# Patient Record
Sex: Female | Born: 1935
Health system: Southern US, Community
[De-identification: ages and names within clinical notes are randomized; demographics above are authoritative.]

## PROBLEM LIST (undated history)

## (undated) DIAGNOSIS — N209 Urinary calculus, unspecified: Secondary | ICD-10-CM

## (undated) DIAGNOSIS — B888 Other specified infestations: Secondary | ICD-10-CM

## (undated) DIAGNOSIS — K5790 Diverticulosis of intestine, part unspecified, without perforation or abscess without bleeding: Secondary | ICD-10-CM

## (undated) DIAGNOSIS — E785 Hyperlipidemia, unspecified: Secondary | ICD-10-CM

## (undated) DIAGNOSIS — A419 Sepsis, unspecified organism: Secondary | ICD-10-CM

## (undated) DIAGNOSIS — F32A Depression, unspecified: Secondary | ICD-10-CM

## (undated) DIAGNOSIS — N10 Acute tubulo-interstitial nephritis: Secondary | ICD-10-CM

## (undated) DIAGNOSIS — R310 Gross hematuria: Secondary | ICD-10-CM

## (undated) DIAGNOSIS — Z9989 Dependence on other enabling machines and devices: Secondary | ICD-10-CM

## (undated) DIAGNOSIS — R300 Dysuria: Secondary | ICD-10-CM

## (undated) DIAGNOSIS — M069 Rheumatoid arthritis, unspecified: Secondary | ICD-10-CM

## (undated) DIAGNOSIS — K5900 Constipation, unspecified: Secondary | ICD-10-CM

## (undated) DIAGNOSIS — B9620 Unspecified Escherichia coli [E. coli] as the cause of diseases classified elsewhere: Secondary | ICD-10-CM

## (undated) DIAGNOSIS — J9621 Acute and chronic respiratory failure with hypoxia: Secondary | ICD-10-CM

## (undated) DIAGNOSIS — M1990 Unspecified osteoarthritis, unspecified site: Secondary | ICD-10-CM

## (undated) DIAGNOSIS — R7881 Bacteremia: Secondary | ICD-10-CM

## (undated) DIAGNOSIS — R0902 Hypoxemia: Secondary | ICD-10-CM

## (undated) DIAGNOSIS — G4733 Obstructive sleep apnea (adult) (pediatric): Secondary | ICD-10-CM

## (undated) DIAGNOSIS — S32059A Unspecified fracture of fifth lumbar vertebra, initial encounter for closed fracture: Secondary | ICD-10-CM

## (undated) DIAGNOSIS — I4891 Unspecified atrial fibrillation: Secondary | ICD-10-CM

## (undated) DIAGNOSIS — R32 Unspecified urinary incontinence: Secondary | ICD-10-CM

## (undated) DIAGNOSIS — F0390 Unspecified dementia without behavioral disturbance: Secondary | ICD-10-CM

## (undated) DIAGNOSIS — Z9889 Other specified postprocedural states: Secondary | ICD-10-CM

## (undated) DIAGNOSIS — W19XXXA Unspecified fall, initial encounter: Secondary | ICD-10-CM

## (undated) DIAGNOSIS — S22089A Unspecified fracture of T11-T12 vertebra, initial encounter for closed fracture: Secondary | ICD-10-CM

## (undated) DIAGNOSIS — N132 Hydronephrosis with renal and ureteral calculous obstruction: Secondary | ICD-10-CM

## (undated) DIAGNOSIS — R609 Edema, unspecified: Secondary | ICD-10-CM

## (undated) DIAGNOSIS — R410 Disorientation, unspecified: Secondary | ICD-10-CM

## (undated) DIAGNOSIS — M4800 Spinal stenosis, site unspecified: Secondary | ICD-10-CM

## (undated) DIAGNOSIS — F349 Persistent mood [affective] disorder, unspecified: Secondary | ICD-10-CM

## (undated) DIAGNOSIS — G894 Chronic pain syndrome: Secondary | ICD-10-CM

## (undated) DIAGNOSIS — M8580 Other specified disorders of bone density and structure, unspecified site: Secondary | ICD-10-CM

## (undated) DIAGNOSIS — G9009 Other idiopathic peripheral autonomic neuropathy: Secondary | ICD-10-CM

## (undated) DIAGNOSIS — N390 Urinary tract infection, site not specified: Secondary | ICD-10-CM

## (undated) DIAGNOSIS — N200 Calculus of kidney: Secondary | ICD-10-CM

## (undated) DIAGNOSIS — F329 Major depressive disorder, single episode, unspecified: Secondary | ICD-10-CM

## (undated) DIAGNOSIS — I714 Abdominal aortic aneurysm, without rupture: Secondary | ICD-10-CM

## (undated) DIAGNOSIS — G9340 Encephalopathy, unspecified: Secondary | ICD-10-CM

## (undated) DIAGNOSIS — I499 Cardiac arrhythmia, unspecified: Secondary | ICD-10-CM

## (undated) DIAGNOSIS — R7303 Prediabetes: Secondary | ICD-10-CM

## (undated) DIAGNOSIS — I10 Essential (primary) hypertension: Secondary | ICD-10-CM

## (undated) HISTORY — DX: Other specified postprocedural states: Z98.890

## (undated) HISTORY — PX: APPENDECTOMY: SHX54

## (undated) HISTORY — DX: Abdominal aortic aneurysm, without rupture: I71.4

## (undated) HISTORY — DX: Diverticulosis of intestine, part unspecified, without perforation or abscess without bleeding: K57.90

## (undated) HISTORY — DX: Other specified disorders of bone density and structure, unspecified site: M85.80

## (undated) HISTORY — PX: CHOLECYSTECTOMY: SHX55

## (undated) HISTORY — DX: Rheumatoid arthritis, unspecified: M06.9

## (undated) HISTORY — DX: Unspecified dementia, unspecified severity, without behavioral disturbance, psychotic disturbance, mood disturbance, and anxiety: F03.90

## (undated) HISTORY — DX: Dependence on other enabling machines and devices: Z99.89

## (undated) HISTORY — PX: JOINT REPLACEMENT: SHX530

## (undated) HISTORY — DX: Spinal stenosis, site unspecified: M48.00

## (undated) HISTORY — DX: Obstructive sleep apnea (adult) (pediatric): G47.33

## (undated) HISTORY — DX: Essential (primary) hypertension: I10

## (undated) HISTORY — DX: Calculus of kidney: N20.0

## (undated) HISTORY — DX: Unspecified osteoarthritis, unspecified site: M19.90

## (undated) HISTORY — PX: FOOT SURGERY: SHX648

## (undated) HISTORY — DX: Hyperlipidemia, unspecified: E78.5

---

## 1997-08-14 ENCOUNTER — Encounter: Admission: RE | Admit: 1997-08-14 | Discharge: 1997-08-14 | Payer: Self-pay | Admitting: Family Medicine

## 1997-09-05 ENCOUNTER — Encounter: Admission: RE | Admit: 1997-09-05 | Discharge: 1997-09-05 | Payer: Self-pay | Admitting: Family Medicine

## 1997-09-05 ENCOUNTER — Ambulatory Visit (HOSPITAL_COMMUNITY): Admission: RE | Admit: 1997-09-05 | Discharge: 1997-09-05 | Payer: Self-pay | Admitting: *Deleted

## 1997-10-01 ENCOUNTER — Encounter: Admission: RE | Admit: 1997-10-01 | Discharge: 1997-10-01 | Payer: Self-pay | Admitting: Family Medicine

## 1997-10-07 ENCOUNTER — Ambulatory Visit: Admission: RE | Admit: 1997-10-07 | Discharge: 1997-10-07 | Payer: Self-pay | Admitting: *Deleted

## 1997-10-20 ENCOUNTER — Encounter: Admission: RE | Admit: 1997-10-20 | Discharge: 1997-10-20 | Payer: Self-pay | Admitting: Family Medicine

## 1998-03-09 ENCOUNTER — Encounter: Admission: RE | Admit: 1998-03-09 | Discharge: 1998-03-09 | Payer: Self-pay | Admitting: Family Medicine

## 1998-03-13 ENCOUNTER — Ambulatory Visit (HOSPITAL_COMMUNITY): Admission: RE | Admit: 1998-03-13 | Discharge: 1998-03-13 | Payer: Self-pay

## 1998-04-14 ENCOUNTER — Encounter: Admission: RE | Admit: 1998-04-14 | Discharge: 1998-04-14 | Payer: Self-pay | Admitting: Sports Medicine

## 1998-06-13 ENCOUNTER — Emergency Department (HOSPITAL_COMMUNITY): Admission: EM | Admit: 1998-06-13 | Discharge: 1998-06-13 | Payer: Self-pay | Admitting: Emergency Medicine

## 1998-06-13 ENCOUNTER — Encounter: Payer: Self-pay | Admitting: Emergency Medicine

## 1998-08-12 ENCOUNTER — Encounter: Admission: RE | Admit: 1998-08-12 | Discharge: 1998-08-12 | Payer: Self-pay | Admitting: Family Medicine

## 1999-04-27 ENCOUNTER — Encounter: Admission: RE | Admit: 1999-04-27 | Discharge: 1999-04-27 | Payer: Self-pay | Admitting: Family Medicine

## 1999-05-27 ENCOUNTER — Encounter: Admission: RE | Admit: 1999-05-27 | Discharge: 1999-05-27 | Payer: Self-pay | Admitting: Family Medicine

## 1999-07-27 ENCOUNTER — Encounter: Admission: RE | Admit: 1999-07-27 | Discharge: 1999-07-27 | Payer: Self-pay | Admitting: Sports Medicine

## 1999-07-27 ENCOUNTER — Encounter: Payer: Self-pay | Admitting: Sports Medicine

## 1999-08-06 ENCOUNTER — Encounter: Admission: RE | Admit: 1999-08-06 | Discharge: 1999-08-06 | Payer: Self-pay | Admitting: Family Medicine

## 2000-02-17 ENCOUNTER — Encounter: Admission: RE | Admit: 2000-02-17 | Discharge: 2000-02-17 | Payer: Self-pay | Admitting: Family Medicine

## 2000-02-21 ENCOUNTER — Encounter: Admission: RE | Admit: 2000-02-21 | Discharge: 2000-02-21 | Payer: Self-pay | Admitting: Sports Medicine

## 2000-03-06 ENCOUNTER — Encounter: Admission: RE | Admit: 2000-03-06 | Discharge: 2000-03-06 | Payer: Self-pay | Admitting: Family Medicine

## 2000-09-06 ENCOUNTER — Encounter: Admission: RE | Admit: 2000-09-06 | Discharge: 2000-09-06 | Payer: Self-pay | Admitting: Family Medicine

## 2000-11-10 ENCOUNTER — Encounter: Admission: RE | Admit: 2000-11-10 | Discharge: 2000-11-10 | Payer: Self-pay | Admitting: Family Medicine

## 2000-12-04 ENCOUNTER — Encounter: Admission: RE | Admit: 2000-12-04 | Discharge: 2000-12-04 | Payer: Self-pay | Admitting: Family Medicine

## 2001-01-04 ENCOUNTER — Encounter: Admission: RE | Admit: 2001-01-04 | Discharge: 2001-01-04 | Payer: Self-pay | Admitting: Family Medicine

## 2001-01-12 ENCOUNTER — Encounter: Payer: Self-pay | Admitting: Sports Medicine

## 2001-01-12 ENCOUNTER — Encounter: Admission: RE | Admit: 2001-01-12 | Discharge: 2001-01-12 | Payer: Self-pay | Admitting: Sports Medicine

## 2001-01-23 ENCOUNTER — Encounter: Admission: RE | Admit: 2001-01-23 | Discharge: 2001-01-23 | Payer: Self-pay | Admitting: Sports Medicine

## 2001-03-21 ENCOUNTER — Encounter: Admission: RE | Admit: 2001-03-21 | Discharge: 2001-03-21 | Payer: Self-pay | Admitting: Family Medicine

## 2001-03-22 ENCOUNTER — Encounter: Admission: RE | Admit: 2001-03-22 | Discharge: 2001-03-22 | Payer: Self-pay | Admitting: Sports Medicine

## 2001-07-10 ENCOUNTER — Encounter: Payer: Self-pay | Admitting: Emergency Medicine

## 2001-07-10 ENCOUNTER — Emergency Department (HOSPITAL_COMMUNITY): Admission: EM | Admit: 2001-07-10 | Discharge: 2001-07-10 | Payer: Self-pay | Admitting: Emergency Medicine

## 2001-07-12 ENCOUNTER — Encounter: Admission: RE | Admit: 2001-07-12 | Discharge: 2001-07-12 | Payer: Self-pay | Admitting: Family Medicine

## 2001-07-12 ENCOUNTER — Encounter: Admission: RE | Admit: 2001-07-12 | Discharge: 2001-07-12 | Payer: Self-pay | Admitting: Sports Medicine

## 2001-07-12 ENCOUNTER — Encounter: Payer: Self-pay | Admitting: Sports Medicine

## 2002-01-22 ENCOUNTER — Encounter: Admission: RE | Admit: 2002-01-22 | Discharge: 2002-01-22 | Payer: Self-pay | Admitting: Family Medicine

## 2002-01-24 ENCOUNTER — Encounter: Payer: Self-pay | Admitting: *Deleted

## 2002-01-24 ENCOUNTER — Encounter: Admission: RE | Admit: 2002-01-24 | Discharge: 2002-01-24 | Payer: Self-pay | Admitting: Sports Medicine

## 2002-02-04 ENCOUNTER — Encounter: Admission: RE | Admit: 2002-02-04 | Discharge: 2002-02-04 | Payer: Self-pay | Admitting: Family Medicine

## 2002-02-20 ENCOUNTER — Encounter: Admission: RE | Admit: 2002-02-20 | Discharge: 2002-02-20 | Payer: Self-pay | Admitting: *Deleted

## 2002-08-14 ENCOUNTER — Encounter: Admission: RE | Admit: 2002-08-14 | Discharge: 2002-08-14 | Payer: Self-pay | Admitting: Sports Medicine

## 2002-08-24 ENCOUNTER — Emergency Department (HOSPITAL_COMMUNITY): Admission: EM | Admit: 2002-08-24 | Discharge: 2002-08-24 | Payer: Self-pay | Admitting: Emergency Medicine

## 2002-09-10 ENCOUNTER — Emergency Department (HOSPITAL_COMMUNITY): Admission: EM | Admit: 2002-09-10 | Discharge: 2002-09-10 | Payer: Self-pay | Admitting: Emergency Medicine

## 2002-12-10 ENCOUNTER — Encounter: Admission: RE | Admit: 2002-12-10 | Discharge: 2002-12-10 | Payer: Self-pay | Admitting: Family Medicine

## 2003-08-27 ENCOUNTER — Encounter: Admission: RE | Admit: 2003-08-27 | Discharge: 2003-08-27 | Payer: Self-pay | Admitting: Internal Medicine

## 2003-10-01 ENCOUNTER — Encounter: Admission: RE | Admit: 2003-10-01 | Discharge: 2003-10-01 | Payer: Self-pay | Admitting: Internal Medicine

## 2003-10-16 ENCOUNTER — Encounter: Admission: RE | Admit: 2003-10-16 | Discharge: 2003-10-16 | Payer: Self-pay | Admitting: Emergency Medicine

## 2003-10-30 ENCOUNTER — Encounter: Admission: RE | Admit: 2003-10-30 | Discharge: 2003-10-30 | Payer: Self-pay | Admitting: Internal Medicine

## 2004-03-09 ENCOUNTER — Ambulatory Visit: Payer: Self-pay | Admitting: Internal Medicine

## 2004-06-03 ENCOUNTER — Ambulatory Visit: Payer: Self-pay | Admitting: Internal Medicine

## 2004-06-10 ENCOUNTER — Encounter (INDEPENDENT_AMBULATORY_CARE_PROVIDER_SITE_OTHER): Payer: Self-pay | Admitting: *Deleted

## 2004-06-10 ENCOUNTER — Ambulatory Visit (HOSPITAL_COMMUNITY): Admission: RE | Admit: 2004-06-10 | Discharge: 2004-06-10 | Payer: Self-pay | Admitting: Internal Medicine

## 2004-06-10 ENCOUNTER — Ambulatory Visit: Payer: Self-pay | Admitting: Internal Medicine

## 2004-09-02 ENCOUNTER — Ambulatory Visit: Payer: Self-pay | Admitting: Internal Medicine

## 2004-09-02 ENCOUNTER — Ambulatory Visit (HOSPITAL_COMMUNITY): Admission: RE | Admit: 2004-09-02 | Discharge: 2004-09-02 | Payer: Self-pay | Admitting: Internal Medicine

## 2004-09-06 ENCOUNTER — Encounter (INDEPENDENT_AMBULATORY_CARE_PROVIDER_SITE_OTHER): Payer: Self-pay | Admitting: *Deleted

## 2004-09-10 ENCOUNTER — Ambulatory Visit: Payer: Self-pay | Admitting: Internal Medicine

## 2004-12-26 ENCOUNTER — Emergency Department (HOSPITAL_COMMUNITY): Admission: EM | Admit: 2004-12-26 | Discharge: 2004-12-26 | Payer: Self-pay | Admitting: Emergency Medicine

## 2005-02-17 ENCOUNTER — Ambulatory Visit: Payer: Self-pay | Admitting: Internal Medicine

## 2005-03-09 ENCOUNTER — Encounter (INDEPENDENT_AMBULATORY_CARE_PROVIDER_SITE_OTHER): Payer: Self-pay | Admitting: *Deleted

## 2005-03-09 ENCOUNTER — Ambulatory Visit (HOSPITAL_COMMUNITY): Admission: RE | Admit: 2005-03-09 | Discharge: 2005-03-09 | Payer: Self-pay | Admitting: *Deleted

## 2005-03-11 ENCOUNTER — Ambulatory Visit: Payer: Self-pay | Admitting: Internal Medicine

## 2005-05-16 ENCOUNTER — Ambulatory Visit: Payer: Self-pay | Admitting: Hospitalist

## 2005-05-16 ENCOUNTER — Ambulatory Visit (HOSPITAL_COMMUNITY): Admission: RE | Admit: 2005-05-16 | Discharge: 2005-05-16 | Payer: Self-pay | Admitting: Internal Medicine

## 2005-11-16 ENCOUNTER — Encounter
Admission: RE | Admit: 2005-11-16 | Discharge: 2005-11-16 | Payer: Self-pay | Admitting: Physical Medicine and Rehabilitation

## 2005-12-16 ENCOUNTER — Encounter
Admission: RE | Admit: 2005-12-16 | Discharge: 2005-12-16 | Payer: Self-pay | Admitting: Physical Medicine and Rehabilitation

## 2006-01-25 ENCOUNTER — Ambulatory Visit: Payer: Self-pay | Admitting: Internal Medicine

## 2006-02-27 ENCOUNTER — Ambulatory Visit: Payer: Self-pay | Admitting: Internal Medicine

## 2006-02-27 LAB — CONVERTED CEMR LAB
BUN: 22 mg/dL (ref 6–23)
CO2: 29 meq/L (ref 19–32)
Calcium: 9.3 mg/dL (ref 8.4–10.5)
Chloride: 100 meq/L (ref 96–112)
Creatinine, Ser: 0.7 mg/dL (ref 0.40–1.20)
Glucose, Bld: 87 mg/dL (ref 70–99)
Potassium: 4.8 meq/L (ref 3.5–5.3)
Sodium: 140 meq/L (ref 135–145)

## 2006-03-11 DIAGNOSIS — I10 Essential (primary) hypertension: Secondary | ICD-10-CM | POA: Insufficient documentation

## 2006-03-11 DIAGNOSIS — R300 Dysuria: Secondary | ICD-10-CM | POA: Insufficient documentation

## 2006-03-11 DIAGNOSIS — G609 Hereditary and idiopathic neuropathy, unspecified: Secondary | ICD-10-CM | POA: Insufficient documentation

## 2006-04-17 ENCOUNTER — Encounter (INDEPENDENT_AMBULATORY_CARE_PROVIDER_SITE_OTHER): Payer: Self-pay | Admitting: *Deleted

## 2006-05-12 ENCOUNTER — Encounter (INDEPENDENT_AMBULATORY_CARE_PROVIDER_SITE_OTHER): Payer: Self-pay | Admitting: Internal Medicine

## 2006-05-12 DIAGNOSIS — N10 Acute tubulo-interstitial nephritis: Secondary | ICD-10-CM | POA: Insufficient documentation

## 2006-05-12 DIAGNOSIS — M25562 Pain in left knee: Secondary | ICD-10-CM | POA: Insufficient documentation

## 2006-05-12 DIAGNOSIS — R609 Edema, unspecified: Secondary | ICD-10-CM | POA: Insufficient documentation

## 2006-05-12 DIAGNOSIS — R32 Unspecified urinary incontinence: Secondary | ICD-10-CM | POA: Insufficient documentation

## 2006-05-12 DIAGNOSIS — E785 Hyperlipidemia, unspecified: Secondary | ICD-10-CM | POA: Insufficient documentation

## 2006-06-17 ENCOUNTER — Encounter
Admission: RE | Admit: 2006-06-17 | Discharge: 2006-06-17 | Payer: Self-pay | Admitting: Physical Medicine and Rehabilitation

## 2006-07-28 ENCOUNTER — Ambulatory Visit (HOSPITAL_COMMUNITY): Admission: RE | Admit: 2006-07-28 | Discharge: 2006-07-29 | Payer: Self-pay | Admitting: Orthopedic Surgery

## 2006-09-11 ENCOUNTER — Emergency Department (HOSPITAL_COMMUNITY): Admission: EM | Admit: 2006-09-11 | Discharge: 2006-09-11 | Payer: Self-pay | Admitting: Emergency Medicine

## 2006-09-13 ENCOUNTER — Encounter: Admission: RE | Admit: 2006-09-13 | Discharge: 2006-09-13 | Payer: Self-pay | Admitting: Orthopedic Surgery

## 2006-10-02 ENCOUNTER — Encounter: Admission: RE | Admit: 2006-10-02 | Discharge: 2006-10-02 | Payer: Self-pay | Admitting: Orthopedic Surgery

## 2006-12-27 HISTORY — PX: CARPAL TUNNEL RELEASE: SHX101

## 2007-01-22 ENCOUNTER — Telehealth: Payer: Self-pay | Admitting: *Deleted

## 2007-01-27 ENCOUNTER — Emergency Department (HOSPITAL_COMMUNITY): Admission: EM | Admit: 2007-01-27 | Discharge: 2007-01-27 | Payer: Self-pay | Admitting: Emergency Medicine

## 2007-02-22 ENCOUNTER — Telehealth (INDEPENDENT_AMBULATORY_CARE_PROVIDER_SITE_OTHER): Payer: Self-pay | Admitting: *Deleted

## 2007-02-26 ENCOUNTER — Telehealth (INDEPENDENT_AMBULATORY_CARE_PROVIDER_SITE_OTHER): Payer: Self-pay | Admitting: *Deleted

## 2007-03-08 ENCOUNTER — Ambulatory Visit: Payer: Self-pay | Admitting: *Deleted

## 2007-03-08 ENCOUNTER — Encounter (INDEPENDENT_AMBULATORY_CARE_PROVIDER_SITE_OTHER): Payer: Self-pay | Admitting: *Deleted

## 2007-03-09 LAB — CONVERTED CEMR LAB
ALT: 11 units/L (ref 0–35)
AST: 16 units/L (ref 0–37)
Albumin: 4.4 g/dL (ref 3.5–5.2)
Alkaline Phosphatase: 72 units/L (ref 39–117)
BUN: 22 mg/dL (ref 6–23)
Bilirubin Urine: NEGATIVE
CO2: 29 meq/L (ref 19–32)
Calcium: 10 mg/dL (ref 8.4–10.5)
Chloride: 101 meq/L (ref 96–112)
Cholesterol: 218 mg/dL — ABNORMAL HIGH (ref 0–200)
Creatinine, Ser: 0.8 mg/dL (ref 0.40–1.20)
Creatinine, Urine: 135.3 mg/dL
Glucose, Bld: 93 mg/dL (ref 70–99)
HCT: 41.2 % (ref 36.0–46.0)
HDL: 38 mg/dL — ABNORMAL LOW (ref >39)
Hemoglobin, Urine: NEGATIVE
Hemoglobin: 13 g/dL (ref 12.0–15.0)
Ketones, ur: NEGATIVE mg/dL
LDL Cholesterol: 134 mg/dL — ABNORMAL HIGH (ref 0–99)
Leukocytes, UA: NEGATIVE
MCHC: 31.6 g/dL (ref 30.0–36.0)
MCV: 91.2 fL (ref 78.0–100.0)
Microalb Creat Ratio: 3.1 mg/g (ref 0.0–30.0)
Microalb, Ur: 0.42 mg/dL (ref 0.00–1.89)
Nitrite: POSITIVE — AB
Platelets: 196 10*3/uL (ref 150–400)
Potassium: 4.5 meq/L (ref 3.5–5.3)
Protein, ur: NEGATIVE mg/dL
RBC / HPF: NONE SEEN (ref <3)
RBC: 4.52 M/uL (ref 3.87–5.11)
RDW: 13.9 % (ref 11.5–14.0)
Sodium: 141 meq/L (ref 135–145)
Specific Gravity, Urine: 1.023 (ref 1.005–1.03)
Total Bilirubin: 0.7 mg/dL (ref 0.3–1.2)
Total CHOL/HDL Ratio: 5.7
Total Protein: 7.6 g/dL (ref 6.0–8.3)
Triglycerides: 231 mg/dL — ABNORMAL HIGH (ref <150)
Urine Glucose: NEGATIVE mg/dL
Urobilinogen, UA: 0.2 (ref 0.0–1.0)
VLDL: 46 mg/dL — ABNORMAL HIGH (ref 0–40)
WBC: 6.8 10*3/uL (ref 4.0–10.5)
pH: 5.5 (ref 5.0–8.0)

## 2007-03-15 ENCOUNTER — Ambulatory Visit: Payer: Self-pay | Admitting: Internal Medicine

## 2007-03-15 ENCOUNTER — Encounter (INDEPENDENT_AMBULATORY_CARE_PROVIDER_SITE_OTHER): Payer: Self-pay | Admitting: *Deleted

## 2007-03-21 ENCOUNTER — Ambulatory Visit (HOSPITAL_COMMUNITY): Admission: RE | Admit: 2007-03-21 | Discharge: 2007-03-21 | Payer: Self-pay | Admitting: *Deleted

## 2007-08-21 ENCOUNTER — Inpatient Hospital Stay (HOSPITAL_COMMUNITY): Admission: RE | Admit: 2007-08-21 | Discharge: 2007-08-27 | Payer: Self-pay | Admitting: Orthopedic Surgery

## 2007-08-21 HISTORY — PX: TOTAL KNEE ARTHROPLASTY: SHX125

## 2007-10-02 ENCOUNTER — Encounter: Admission: RE | Admit: 2007-10-02 | Discharge: 2007-11-29 | Payer: Self-pay | Admitting: Orthopedic Surgery

## 2008-02-13 ENCOUNTER — Telehealth: Payer: Self-pay | Admitting: Infectious Disease

## 2008-02-21 ENCOUNTER — Ambulatory Visit: Payer: Self-pay | Admitting: Internal Medicine

## 2008-02-21 ENCOUNTER — Encounter (INDEPENDENT_AMBULATORY_CARE_PROVIDER_SITE_OTHER): Payer: Self-pay | Admitting: Internal Medicine

## 2008-02-21 ENCOUNTER — Encounter: Payer: Self-pay | Admitting: *Deleted

## 2008-02-22 LAB — CONVERTED CEMR LAB
ALT: 14 units/L (ref 0–35)
AST: 18 units/L (ref 0–37)
Albumin: 4.2 g/dL (ref 3.5–5.2)
Alkaline Phosphatase: 75 units/L (ref 39–117)
BUN: 19 mg/dL (ref 6–23)
CO2: 26 meq/L (ref 19–32)
Calcium: 9 mg/dL (ref 8.4–10.5)
Chloride: 101 meq/L (ref 96–112)
Cholesterol: 200 mg/dL (ref 0–200)
Creatinine, Ser: 0.78 mg/dL (ref 0.40–1.20)
Glucose, Bld: 95 mg/dL (ref 70–99)
HDL: 37 mg/dL — ABNORMAL LOW (ref >39)
LDL Cholesterol: 123 mg/dL — ABNORMAL HIGH (ref 0–99)
Potassium: 4.3 meq/L (ref 3.5–5.3)
Sodium: 140 meq/L (ref 135–145)
Total Bilirubin: 0.8 mg/dL (ref 0.3–1.2)
Total CHOL/HDL Ratio: 5.4
Total Protein: 7 g/dL (ref 6.0–8.3)
Triglycerides: 201 mg/dL — ABNORMAL HIGH (ref <150)
VLDL: 40 mg/dL (ref 0–40)

## 2008-03-04 DIAGNOSIS — R109 Unspecified abdominal pain: Secondary | ICD-10-CM | POA: Insufficient documentation

## 2008-03-06 ENCOUNTER — Ambulatory Visit: Payer: Self-pay | Admitting: *Deleted

## 2008-03-06 ENCOUNTER — Encounter: Payer: Self-pay | Admitting: Internal Medicine

## 2008-03-06 LAB — CONVERTED CEMR LAB
Bilirubin Urine: NEGATIVE
Blood in Urine, dipstick: NEGATIVE
Glucose, Urine, Semiquant: NEGATIVE
Ketones, urine, test strip: NEGATIVE
Nitrite: POSITIVE
Protein, U semiquant: NEGATIVE
Specific Gravity, Urine: >=1.03
Urobilinogen, UA: 0.2
pH: 5

## 2008-03-21 ENCOUNTER — Ambulatory Visit (HOSPITAL_COMMUNITY): Admission: RE | Admit: 2008-03-21 | Discharge: 2008-03-21 | Payer: Self-pay | Admitting: Internal Medicine

## 2008-03-21 ENCOUNTER — Encounter (INDEPENDENT_AMBULATORY_CARE_PROVIDER_SITE_OTHER): Payer: Self-pay | Admitting: Internal Medicine

## 2008-04-09 DIAGNOSIS — M810 Age-related osteoporosis without current pathological fracture: Secondary | ICD-10-CM | POA: Insufficient documentation

## 2008-09-18 ENCOUNTER — Encounter: Admission: RE | Admit: 2008-09-18 | Discharge: 2008-09-18 | Payer: Self-pay | Admitting: Family Medicine

## 2008-09-22 ENCOUNTER — Encounter (INDEPENDENT_AMBULATORY_CARE_PROVIDER_SITE_OTHER): Payer: Self-pay | Admitting: Internal Medicine

## 2008-10-21 ENCOUNTER — Telehealth: Payer: Self-pay | Admitting: *Deleted

## 2009-01-16 ENCOUNTER — Telehealth (INDEPENDENT_AMBULATORY_CARE_PROVIDER_SITE_OTHER): Payer: Self-pay | Admitting: Internal Medicine

## 2009-02-09 ENCOUNTER — Telehealth (INDEPENDENT_AMBULATORY_CARE_PROVIDER_SITE_OTHER): Payer: Self-pay | Admitting: Internal Medicine

## 2010-06-01 NOTE — Progress Notes (Signed)
Summary: Refill/gh  Phone Note Refill Request Message from:  Pharmacy on February 26, 2007 11:39 AM  Refills Requested: Medication #1:  HYDROCHLOROTHIAZIDE 25 MG TABS Take 1 tablet by mouth once a day   Last Refilled: 01/22/2007  Medication #2:  ATENOLOL 25 MG TABS Take 1 tablet by mouth once a day   Last Refilled: 01/25/2007  Method Requested: Electronic Initial call taken by: Angelina Ok RN,  February 26, 2007 11:40 AM  Follow-up for Phone Call       Follow-up by: Hollace Hayward MD,  February 26, 2007 1:36 PM      Prescriptions: ATENOLOL 25 MG TABS (ATENOLOL) Take 1 tablet by mouth once a day  #30 x 11   Entered by:   Hollace Hayward MD   Authorized by:   Marland Kitchen [facility] Triage Nurse   Signed by:   Hollace Hayward MD on 02/26/2007   Method used:   Electronically sent to ...       [facility] [phone]*       [address]       [place], [place]  [address]       Ph: [phone] or [phone]       Fax: [phone]   RxID:   [id number]

## 2010-06-01 NOTE — Progress Notes (Signed)
Summary: refill/ hla  Phone Note Refill Request Message from:  Fax from Pharmacy on February 09, 2009 4:44 PM  Refills Requested: Medication #1:  ALENDRONATE SODIUM 70 MG TABS Take one tablet by mouth once a week with one full glass of water on an empty stomach and do not lay down for 30 minutes after taking..   Last Refilled: 9/17 Initial call taken by: Marin Roberts RN,  February 09, 2009 4:44 PM    Prescriptions: ALENDRONATE SODIUM 70 MG TABS (ALENDRONATE SODIUM) Take one tablet by mouth once a week with one full glass of water on an empty stomach and do not lay down for 30 minutes after taking.  #4 x 3   Entered and Authorized by:   Joaquin Courts  MD   Signed by:   Joaquin Courts  MD on 02/10/2009   Method used:   Electronically to        [facility]* (retail)       [address]       [place], [place]  [address]       Ph: [phone] or [phone]       Fax: [phone]   RxID:   [id number]

## 2010-06-01 NOTE — Assessment & Plan Note (Signed)
Summary: rt side pain/gg   Vital Signs:  Patient Profile:   77 Years Old Female Height:     62 inches (157.48 cm) Weight:      167.5 pounds (76.14 kg) BMI:     30.75 Temp:     98.0 degrees F (36.67 degrees C) oral Pulse rate:   65 / minute BP sitting:   101 / 70  (left arm)  Pt. in pain?   yes    Location:   rt side    Intensity:   2    Type:       sharp  Vitals Entered By: Chinita Pester RN (March 06, 2008 8:52 AM)              Is Patient Diabetic? No Nutritional Status BMI of > 30 = obese  Have you ever been in a relationship where you felt threatened, hurt or afraid?Unable to ask: son w/pt   Does patient need assistance? Functional Status Self care Ambulation Normal     PCP:  Joaquin Courts  MD  Chief Complaint:  Rt side pain x 2 days.  History of Present Illness: Patient is a 77 yo female with PMH of recurrent pyelonephritis came in for right flank pain. It started about two days ago, the pain was sharp, intermittent, no radiation, the severity was about 7/10, lasting about 5 minutes when the pain started and has about 3-4 episodes each day, moving or deep breathing could make it worse and rest made it better. She has no chill, fever, abdominal pain, dysuria, hematuria, or urinary frequency. Now her pain has been much better and the sevrity is about 2/10. She denies use of any drugs or alcohol. She has good appetite , BM and urination is normal, no weight change.     Updated Prior Medication List: HYDROCHLOROTHIAZIDE 25 MG TABS (HYDROCHLOROTHIAZIDE) Take 1 tablet by mouth once a day ATENOLOL 25 MG TABS (ATENOLOL) Take 1 tablet by mouth once a day NEURONTIN 600 MG TABS (GABAPENTIN) Take 1 tablet by mouth three times a day per Dr. Lindie Spruce. OXYCONTIN 20 MG XR12H-TAB (OXYCODONE HCL) Per Dr. Lennon Alstrom  Current Allergies: ! SULFA  Past Medical History:    Reviewed history from 03/15/2007 and no changes required:       Hypertension       Peripheral neuropathy   Bilateral carpal tunnel release            Right ulnar release at her elbow       E. Coli pyelonephritis, uncomplicated 9/07       Hyperlipidemia       Osteoarthritis            Dr August Saucer:  may do knee replacement next year       Urinary incontinence, urge       Chronic pain:  Dr. Al Corpus       h/o hysterectomy 1979 2/2 increased bleeding   Family History:    Reviewed history from 02/21/2008 and no changes required:       Dad: CVA in 12's       Mom: CVA in 62's  Social History:    Reviewed history from 02/21/2008 and no changes required:       Lives with son, never smoked, no etoh or illegal drugs.  Divorced.  Occasionally walks.   Risk Factors:  Tobacco use:  never Alcohol use:  no Exercise:  no Seatbelt use:  100 %  Family History Risk Factors:  Family History of MI in females < 21 years old:  no    Family History of MI in males < 72 years old:  no  Mammogram History:    Date of Last Mammogram:  03/18/2005   Review of Systems       See HPI   Physical Exam  General:     alert, well-developed, and well-nourished.   Head:     normocephalic, atraumatic, and no abnormalities observed.   Neck:     supple and full ROM.   Chest Wall:     no deformities and no tenderness.   Lungs:     normal respiratory effort, no intercostal retractions, no dullness, no crackles, and no wheezes.   Heart:     normal rate, regular rhythm, and no murmur.   Abdomen:     soft, normal bowel sounds, no distention, no masses, no guarding, and R flank tenderness. CVA tenderness on the right.  Msk:     normal ROM, no joint tenderness, no joint swelling, and no joint warmth.   Neurologic:     alert & oriented X3, cranial nerves II-XII intact, strength normal in all extremities, and sensation intact to light touch.      Impression & Recommendations:  Problem # 1:  FLANK PAIN, RIGHT (ICD-789.09) Assessment: New Patient with PMH of recurrent pyelonephritis came in for intermittent right  flank pain for two days. It was sharp, 7/10 in the beginning, now much better with the severity of 2/10, moving or deep breathing made it worse and rest made it better, no radiation. she has no fever, chill, N/V, dysuria, hematuria or urinaryy frquency. There is right flank tenderness and right CVA tenderness and urine dipstick shows positive nitrite and trace leukocyte esterase. Based on these findings, her symptoms are likely deu to UTI or urolithiasis. We will treat her with emperic antibiotics of cipro 250 mg by mouth two times a day for three days and will do urine culture now.     Orders: T-Culture, Urine ([id number])  Her updated medication list for this problem includes:    Oxycontin 20 Mg Xr12h-tab (Oxycodone hcl) .Marland Kitchen... Per dr. Lennon Alstrom    Cipro 250 mg by mouth two times a day for two days   Problem # 2:  HYPERLIPIDEMIA (ICD-272.4) Assessment: Unchanged Her recent FLP (02/21/08) showed that TG 201, HDL 37, LDL 123, and VLDL 40. She is 77 yo with low HDL and no FMH of CAD, her LDL goal would be less than 130. Her LDL 123, so will not give statin at this time. Will continue to monitor FLP.   Problem # 3:  HYPERTENSION (ICD-401.9) Her BP well controlled, 101/70, HR 65, patient has no complains of dizziness, CP or palpitation. Continue current medications and may check BMET at next visit.  Her updated medication list for this problem includes:    Hydrochlorothiazide 25 Mg Tabs (Hydrochlorothiazide) .Marland Kitchen... Take 1 tablet by mouth once a day    Atenolol 25 Mg Tabs (Atenolol) .Marland Kitchen... Take 1 tablet by mouth once a day  Assessment: Comment Only  Complete Medication List: 1)  Hydrochlorothiazide 25 Mg Tabs (Hydrochlorothiazide) .... Take 1 tablet by mouth once a day 2)  Atenolol 25 Mg Tabs (Atenolol) .... Take 1 tablet by mouth once a day 3)  Neurontin 600 Mg Tabs (Gabapentin) .... Take 1 tablet by mouth three times a day per dr. Lindie Spruce. 4)  Oxycontin 20 Mg Xr12h-tab (Oxycodone hcl) .... Per dr.  Lennon Alstrom  Other Orders:  T-Urinalysis Dipstick only ([id number])   Patient Instructions: 1)  Please schedule a follow-up appointment in 3 months. 2)  Please come to the Clinic if the symptoms get worse.   Prescriptions: CIPROFLOXACIN HCL 250 MG TABS (CIPROFLOXACIN HCL) take one tablet by mouth two times a day  #6 x 0   Entered and Authorized by:   Jackson Latino MD   Signed by:   Jackson Latino MD on 03/12/2008   Method used:   Historical   RxID:   [id number]  ] Laboratory Results   Urine Tests  Date/Time Recieved: 03/06/08  1050 AM Date/Time Reported: 03/06/08  1050 AM  Routine Urinalysis   Glucose: negative   (Normal Range: Negative) Bilirubin: negative   (Normal Range: Negative) Ketone: negative   (Normal Range: Negative) Spec. Gravity: >=1.030   (Normal Range: 1.003-1.035) Blood: negative   (Normal Range: Negative) pH: 5.0   (Normal Range: 5.0-8.0) Protein: negative   (Normal Range: Negative) Urobilinogen: 0.2   (Normal Range: 0-1) Nitrite: positive   (Normal Range: Negative) Leukocyte Esterace: trace   (Normal Range: Negative)

## 2010-06-01 NOTE — Letter (Signed)
Summary: Handout Printed  Printed Handout:  - *Patient Instructions 

## 2010-06-01 NOTE — Miscellaneous (Signed)
Summary: vaccine record  vaccine record   Imported By: Margie Billet 04/17/2006 17:30:00  _____________________________________________________________________  External Attachment:    Type:   Image     Comment:   External Document

## 2010-06-01 NOTE — Miscellaneous (Signed)
Summary: HIPAA Restrictions  HIPAA Restrictions   Imported By: Florinda Marker 02/21/2008 14:18:47  _____________________________________________________________________  External Attachment:    Type:   Image     Comment:   External Document

## 2010-06-01 NOTE — Miscellaneous (Signed)
Summary: medication update  Clinical Lists Changes  Medications: Added new medication of HYDROCHLOROTHIAZIDE 25 MG TABS (HYDROCHLOROTHIAZIDE) Take 1 tablet by mouth once a day Added new medication of LIPITOR 10 MG TABS (ATORVASTATIN CALCIUM) Take 1 tablet by mouth at bedtime Added new medication of ATENOLOL 25 MG TABS (ATENOLOL) Take 1 tablet by mouth once a day Added new medication of NEURONTIN 600 MG TABS (GABAPENTIN) Take 1 tablet by mouth three times a day Added new medication of * VICODIN

## 2010-06-01 NOTE — Progress Notes (Signed)
Summary: refill/gg     [place]  Phone Note Refill Request  on February 22, 2007 3:32 PM  Refills Requested: Medication #1:  HYDROCHLOROTHIAZIDE 25 MG TABS Take 1 tablet by mouth once a day Pt has appointment 11/6  Can she get enough to last until then??   Method Requested: electronic Initial call taken by: Merrie Roof RN,  February 22, 2007 3:32 PM  Follow-up for Phone Call       Follow-up by: Hollace Hayward MD,  February 26, 2007 1:35 PM      Prescriptions: HYDROCHLOROTHIAZIDE 25 MG TABS (HYDROCHLOROTHIAZIDE) Take 1 tablet by mouth once a day  #30 x 11   Entered and Authorized by:   Hollace Hayward MD   Signed by:   Hollace Hayward MD on 02/26/2007   Method used:   Electronically sent to ...       [facility] [phone]*       [address]       [place], [place]  [address]       Ph: [phone] or [phone]       Fax: [phone]   RxID:   [id number]

## 2010-06-01 NOTE — Assessment & Plan Note (Signed)
Summary: CHECKUP/ SB.   Vital Signs:  Patient Profile:   77 Years Old Female Height:     62 inches (157.48 cm) Weight:      168.4 pounds (76.55 kg) BMI:     30.91 Temp:     97.6 degrees F (36.44 degrees C) oral Pulse rate:   63 / minute BP sitting:   123 / 81  (right arm)  Pt. in pain?   no  Vitals Entered By: Filomena Jungling NT II (February 21, 2008 8:45 AM)              Is Patient Diabetic? No Nutritional Status BMI of 25 - 29 = overweight  Have you ever been in a relationship where you felt threatened, hurt or afraid?No   Does patient need assistance? Functional Status Self care Ambulation Normal     PCP:  Joaquin Courts  MD  Chief Complaint:  FLU SHOT/ CHECKUP.  History of Present Illness: Pt is a 77 yo female w/ past medical history of  Hypertension Peripheral neuropathy      Bilateral carpal tunnel release      Right ulnar release at her elbow E. Coli pyelonephritis, uncomplicated 9/07 Hyperlipidemia Osteoarthritis      Dr August Saucer:  may do knee replacement next year Urinary incontinence, urge Chronic pain:  Dr. Al Corpus h/o hysterectomy 1979 2/2 increased bleeding   here to establish with me as her PCP and for routine care.  She is accompanied by her son with whom she lives.  She is not having any problems.  She needs medication refills and would like her flu shot.  She had her R knee replaced several months ago and is doing well.  She is independent in all of her ADL's and IADL's.  No hx of falls.  She notes having a colonscopy last year but can't remember with whom.     Prior Medications Reviewed Using: Patient Recall  Updated Prior Medication List: HYDROCHLOROTHIAZIDE 25 MG TABS (HYDROCHLOROTHIAZIDE) Take 1 tablet by mouth once a day ATENOLOL 25 MG TABS (ATENOLOL) Take 1 tablet by mouth once a day NEURONTIN 600 MG TABS (GABAPENTIN) Take 1 tablet by mouth three times a day OXYCONTIN 20 MG XR12H-TAB (OXYCODONE HCL) Per Dr. Lennon Alstrom  Current Allergies (reviewed  today): ! SULFA  Past Medical History:    Reviewed history from 03/15/2007 and no changes required:       Hypertension       Peripheral neuropathy            Bilateral carpal tunnel release            Right ulnar release at her elbow       E. Coli pyelonephritis, uncomplicated 9/07       Hyperlipidemia       Osteoarthritis            Dr August Saucer:  may do knee replacement next year       Urinary incontinence, urge       Chronic pain:  Dr. Al Corpus       h/o hysterectomy 1979 2/2 increased bleeding  Past Surgical History:    R knee replacement in 04/09 by Dr. August Saucer    S/p carpel tunnel release bilaterally    Ulnar nerve repair    Hysterectomy in 80's    cataract surgery    appendectomy   Family History:    Dad: CVA in 71's    Mom: CVA in 16's  Social History:  Lives with son, never smoked, no etoh or illegal drugs.  Divorced.  Occasionally walks.   Risk Factors:  Tobacco use:  never  Mammogram History:    Date of Last Mammogram:  03/18/2005   Review of Systems       As per HPI.   Physical Exam  General:     Alert, oriented x3, no distress.  Head:     [place]/AT Eyes:     PERRL, EOMI, anicteric. Mouth:     Adentulous, MMM. Neck:     No LAD, no carotid bruit. Lungs:     Normal respiratory effort, chest expands symmetrically. Lungs are clear to auscultation, no crackles or wheezes. Heart:     Normal rate and regular rhythm. S1 and S2 normal without gallop, murmur, click, rub or other extra sounds. Abdomen:     +BS's, soft, NT, and ND.  Multiple well healed incisions noted.   Msk:     Strength 5/5 in all extremities.  Mild kyphosis noted. Pulses:     2+ radial and DP pulses. Extremities:     No peripheral edema.  Well healed incision on R knee from replacement. Neurologic:     CN's II-XII intact, gait normal without assistive devices, oriented x 3.   Skin:     .5 cm pigmented nevus on R cheek that is symmetrical, flat, and color is uniform  throughout. Axillary Nodes:     No palpable lymphadenopathy Psych:     Mood appropriate.    Impression & Recommendations:  Problem # 1:  HYPERTENSION (ICD-401.9) BP well controlled.  Checking BMET.  Her updated medication list for this problem includes:    Hydrochlorothiazide 25 Mg Tabs (Hydrochlorothiazide) .Marland Kitchen... Take 1 tablet by mouth once a day    Atenolol 25 Mg Tabs (Atenolol) .Marland Kitchen... Take 1 tablet by mouth once a day  Orders: T-Comprehensive Metabolic Panel ([id number])  BP today: 123/81 Prior BP: 114/70 (03/15/2007)  Labs Reviewed: Creat: 0.80 (03/08/2007) Chol: 218 (03/08/2007)   HDL: 38 (03/08/2007)   LDL: 134 (03/08/2007)   TG: 231 (03/08/2007)   Problem # 2:  PREVENTIVE HEALTH CARE (ICD-V70.0) No need for paps b/c s/p hysterectomy.  Mammogram due next month.  Will get dexa at that time looking for osteporosis.  Pt notes having a colonoscopy within the last year so will hold off on stool cards for now and Mamie is calling [facility] for records.  Screening for hyperlipidemia today.  Flu shot given.  Orders: Dexa scan (Dexa scan)   Problem # 3:  OSTEOARTHRITIS (ICD-715.90) Doing well post op from knee replacement.  Pain meds per Dr. Lennon Alstrom and Dr Lindie Spruce.  Her updated medication list for this problem includes:    Oxycontin 20 Mg Xr12h-tab (Oxycodone hcl) .Marland Kitchen... Per dr. Lennon Alstrom   Complete Medication List: 1)  Hydrochlorothiazide 25 Mg Tabs (Hydrochlorothiazide) .... Take 1 tablet by mouth once a day 2)  Atenolol 25 Mg Tabs (Atenolol) .... Take 1 tablet by mouth once a day 3)  Neurontin 600 Mg Tabs (Gabapentin) .... Take 1 tablet by mouth three times a day per dr. Lindie Spruce. 4)  Oxycontin 20 Mg Xr12h-tab (Oxycodone hcl) .... Per dr. Lennon Alstrom  Other Orders: T-Lipid Profile [id number])   Patient Instructions: 1)  Please make a followup appointment in 6 months. 2)  Please get your mammogram done next month. 3)  Please get your dexa scan done.  This test is looking for  osteoporosis. 4)  We will call you with any abnormal bloodwork from today.  5)  Great job taking your medicines.  Your blood pressure is very good!!!   Prescriptions: ATENOLOL 25 MG TABS (ATENOLOL) Take 1 tablet by mouth once a day  #30 x 11   Entered and Authorized by:   Joaquin Courts  MD   Signed by:   Joaquin Courts  MD on 02/21/2008   Method used:   Print then Give to Patient   RxID:   [id number] HYDROCHLOROTHIAZIDE 25 MG TABS (HYDROCHLOROTHIAZIDE) Take 1 tablet by mouth once a day  #30 x 11   Entered and Authorized by:   Joaquin Courts  MD   Signed by:   Joaquin Courts  MD on 02/21/2008   Method used:   Print then Give to Patient   RxID:   [id number]  ]

## 2010-06-01 NOTE — Progress Notes (Signed)
Summary: med refill/gp  Phone Note Refill Request Message from:  Fax from Pharmacy on February 13, 2008 4:49 PM  Refills Requested: Medication #1:  HYDROCHLOROTHIAZIDE 25 MG TABS Take 1 tablet by mouth once a day   Last Refilled: 01/16/2008  Method Requested: Electronic Initial call taken by: Chinita Pester RN,  February 13, 2008 4:49 PM  Follow-up for Phone Call        Rx e- written Follow-up by: Paulette Blanch Dam MD,  February 14, 2008 9:14 AM      Prescriptions: HYDROCHLOROTHIAZIDE 25 MG TABS (HYDROCHLOROTHIAZIDE) Take 1 tablet by mouth once a day  #30 x 11   Entered and Authorized by:   Acey Lav MD   Signed by:   Acey Lav MD on 02/14/2008   Method used:   Electronically to        [facility]* (retail)       [address] [place], [place]  [address]       Ph: [phone] or [phone]       Fax: [phone]   RxID:   [id number]

## 2010-06-01 NOTE — Assessment & Plan Note (Signed)
Summary: flu vaccine only//kg   Vital Signs:  Patient Profile:   77 Years Old Female Height:     62 inches Weight:      162.2 pounds Temp:     98.2 degrees F oral Pulse rate:   59 / minute BP sitting:   114 / 70  (right arm)  Vitals Entered By: Krystal Eaton Duncan Dull) (March 15, 2007 9:13 AM)                 Chief Complaint:  flu vaccine only.  History of Present Illness: Unaware that the patient had a hysterectomy previously in 1979 for increased bleeding.  Will not charge her for this office visit.  She is to get a colonoscopy later on this month.  Current Allergies: ! SULFA  Past Medical History:    Hypertension    Peripheral neuropathy         Bilateral carpal tunnel release         Right ulnar release at her elbow    E. Coli pyelonephritis, uncomplicated 9/07    Hyperlipidemia    Osteoarthritis         Dr August Saucer:  may do knee replacement next year    Urinary incontinence, urge    Chronic pain:  Dr. Al Corpus    h/o hysterectomy 1979 2/2 increased bleeding        Impression & Recommendations:  Problem # 1:  PREVENTIVE HEALTH CARE (ICD-V70.0) Will get flu shot today.  And have a colonoscopy later this month Orders: No Charge Patient Arrived ([id number])   Complete Medication List: 1)  Hydrochlorothiazide 25 Mg Tabs (Hydrochlorothiazide) .... Take 1 tablet by mouth once a day 2)  Lipitor 10 Mg Tabs (Atorvastatin calcium) .... Take 1 tablet by mouth at bedtime 3)  Atenolol 25 Mg Tabs (Atenolol) .... Take 1 tablet by mouth once a day 4)  Neurontin 600 Mg Tabs (Gabapentin) .... Take 1 tablet by mouth three times a day 5)  Vicodin   Other Orders: Influenza Vaccine MCR ([id number])   Patient Instructions: 1)  Please schedule a follow-up appointment in 6 months. 2)  Have a Happy Thanksgiving.    ]  Influenza Vaccine    Vaccine Type: Fluvax MCR    Site: right deltoid    Mfr: novartis    Dose: 0.5 ml    Route: IM    Given by: Krystal Eaton (AAMA)  Exp. Date: 10/30/2007    Lot #: [id number]    VIS given: 10/29/04 version given March 15, 2007.  Flu Vaccine Consent Questions    Do you have a history of severe allergic reactions to this vaccine? no    Any prior history of allergic reactions to egg and/or gelatin? no    Do you have a sensitivity to the preservative Thimersol? no    Do you have a past history of Guillan-Barre Syndrome? no    Do you currently have an acute febrile illness? no    Have you ever had a severe reaction to latex? no    Vaccine information given and explained to patient? yes    Are you currently pregnant? no

## 2010-06-01 NOTE — Progress Notes (Signed)
Summary: Refill/gh  Phone Note Refill Request Message from:  Fax from Pharmacy on January 16, 2009 10:56 AM  Refills Requested: Medication #1:  ATENOLOL 25 MG TABS Take 1 tablet by mouth once a day   Last Refilled: 01/13/2009  Method Requested: Electronic Initial call taken by: Angelina Ok RN,  January 16, 2009 10:56 AM  Follow-up for Phone Call        Please let her know she is due for an appt. Follow-up by: Joaquin Courts  MD,  January 16, 2009 3:20 PM    Prescriptions: ATENOLOL 25 MG TABS (ATENOLOL) Take 1 tablet by mouth once a day  #30 x 0   Entered and Authorized by:   Joaquin Courts  MD   Signed by:   Joaquin Courts  MD on 01/16/2009   Method used:   Electronically to        [facility]* (retail)       [address]       [place], [place]  [address]       Ph: [phone] or [phone]       Fax: [phone]   RxID:   [id number]   Appended Document: Refill/gh Call to pt.  She has transferred her care.  Call to pharmacy prescription was cancelled.  They said that the pt had refills a;ready and the refill requet was not needed.  Angelina Ok, RN January 16, 2009 4:13 PM

## 2010-06-01 NOTE — Miscellaneous (Signed)
Summary: [facility]  [facility]   Imported By: Florinda Marker 09/26/2008 14:37:49  _____________________________________________________________________  External Attachment:    Type:   Image     Comment:   External Document

## 2010-06-01 NOTE — Progress Notes (Signed)
Summary: refill/gh  Phone Note Refill Request Message from:  Pharmacy on January 22, 2007 11:07 AM  Refills Requested: Medication #1:  HYDROCHLOROTHIAZIDE 25 MG TABS Take 1 tablet by mouth once a day   Last Refilled: 01/02/2007 Initial call taken by: Angelina Ok RN,  January 22, 2007 11:07 AM  Follow-up for Phone Call        Pt needs to be seen by PCP. Will give #30 until can be seen. Needs labs at that appt. Please inform pt. Follow-up by: Ned Grace MD,  January 22, 2007 11:40 AM  Additional Follow-up for Phone Call Additional follow up Details #1::       Additional Follow-up by: Angelina Ok RN,  January 23, 2007 10:47 AM      Prescriptions: HYDROCHLOROTHIAZIDE 25 MG TABS (HYDROCHLOROTHIAZIDE) Take 1 tablet by mouth once a day  #30 x 0   Entered and Authorized by:   Ned Grace MD   Signed by:   Ned Grace MD on 01/22/2007   Method used:   Electronically sent to ...       [facility]       [address]       [place], [place]  [address]       Ph: [phone] or [phone]       Fax: [phone]   RxID:   [id number]

## 2010-06-01 NOTE — Miscellaneous (Signed)
Summary: Immunization Entry    Influenza Vaccine    Vaccine Type: Fluvax MCR    Site: left deltoid    Mfr: GlaxoSmithKline    Dose: 0.5 ml    Route: IM    Given by: Chinita Pester RN    Exp. Date: 10/29/2008    Lot #: [id number]    VIS given: 11/23/06 version given February 21, 2008.  Flu Vaccine Consent Questions    Do you have a history of severe allergic reactions to this vaccine? no    Any prior history of allergic reactions to egg and/or gelatin? no    Do you have a sensitivity to the preservative Thimersol? no    Do you have a past history of Guillan-Barre Syndrome? no    Do you currently have an acute febrile illness? no    Have you ever had a severe reaction to latex? no    Vaccine information given and explained to patient? yes    Are you currently pregnant? no
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
## 2010-06-01 NOTE — Progress Notes (Signed)
Summary: refill/ hla  Phone Note Refill Request Message from:  Pharmacy on October 21, 2008 10:10 AM  Refills Requested: Medication #1:  ALENDRONATE SODIUM 70 MG TABS Take one tablet by mouth once a week with one full glass of water on an empty stomach and do not lay down for 30 minutes after taking..   Last Refilled: 09/26/2008 Initial call taken by: Marin Roberts RN,  October 21, 2008 10:11 AM  Follow-up for Phone Call        Refilled electronically.  Please schedule an appointment with PCP when available. Follow-up by: Margarito Liner MD,  October 21, 2008 10:52 AM      Prescriptions: ALENDRONATE SODIUM 70 MG TABS (ALENDRONATE SODIUM) Take one tablet by mouth once a week with one full glass of water on an empty stomach and do not lay down for 30 minutes after taking.  #4 x 3   Entered by:   Margarito Liner MD   Authorized by:   Joaquin Courts  MD   Signed by:   Margarito Liner MD on 10/21/2008   Method used:   Electronically to        [facility]* (retail)       [address]       [place], [place]  [address]       Ph: [phone] or [phone]       Fax: [phone]   RxID:   [id number]

## 2010-07-08 ENCOUNTER — Other Ambulatory Visit: Payer: Self-pay | Admitting: Family Medicine

## 2010-07-08 DIAGNOSIS — I714 Abdominal aortic aneurysm, without rupture, unspecified: Secondary | ICD-10-CM

## 2010-07-09 ENCOUNTER — Ambulatory Visit
Admission: RE | Admit: 2010-07-09 | Discharge: 2010-07-09 | Disposition: A | Discharge status: Home / Self Care | Payer: Medicare Other | Source: Ambulatory Visit | Attending: Family Medicine | Admitting: Family Medicine

## 2010-07-09 DIAGNOSIS — I714 Abdominal aortic aneurysm, without rupture, unspecified: Secondary | ICD-10-CM

## 2010-07-09 MED ORDER — IOHEXOL 350 MG/ML SOLN
100.0000 mL | Freq: Once | INTRAVENOUS | Status: AC | PRN
  Administered 2010-07-09: 100 mL via INTRAVENOUS

## 2010-07-13 LAB — HM DIABETES EYE EXAM

## 2010-09-14 NOTE — Op Note (Signed)
NAMECARRI, SPILLERS                 ACCOUNT NO.:  [id number]   MEDICAL RECORD NO.:  [id number]          PATIENT TYPE:  INP   LOCATION:  5010                         FACILITY:  [facility]   PHYSICIAN:  Burnard Bunting, M.D.    DATE OF BIRTH:  11-22-1933   DATE OF PROCEDURE:  08/21/2007  DATE OF DISCHARGE:                               OPERATIVE REPORT   PREOPERATIVE DIAGNOSIS:  Right knee arthritis.   POSTOPERATIVE DIAGNOSIS:  Right knee arthritis.   PROCEDURE:  Right total knee replacement.   SURGEON:  Burnard Bunting, M.D.   ASSISTANT:  Wende Neighbors, P.A.   ANESTHESIA:  General endotracheal.   BLOOD LOSS:  150.   DRAINS:  Hemovac x1.   TOURNIQUET TIME:  1 hour 50 minutes at 300 mmHg.   INDICATIONS:  Kenasia Scheller is a 77 year old patient with end-stage knee  arthritis who presents for total knee replacement after failure with  conservative management and explanation of risks and benefits.   COMPONENTS USED:  DePuy rotating platform, 3 femur, 2.5 tibia, 10.32  patella.   PROCEDURE IN DETAIL:  The patient was brought to the operating room  where general endotracheal anesthesia was induced.  Time out was called.  The right knee was prepped with DuraPrep solution and draped in the  sterile manner.  Collier Flowers was used to scrub the operative field.  The leg  was elevated, exsanguinated with an Esmarch wrap, and tourniquet was  inflated.  The anterior _approach to the knee_________  was utilized.  Skin subcutaneous tissue was sharply divided.  Median parapatellar  approach to the knee was made.  Precise location of this arthrotomy was  marked with #1Vicryl suture.  Fat pad was partially excised.  Lateral  patellofemoral ligament was released.  Soft tissue was elevated off the  distal anterior aspect of the femur while leaving the periosteum in  place.  Osteophytes were removed.  ACL and PCL were released.  At this  time, 2 pins were placed in the distal anteromedial femur and proximal  anteromedial tibia.  With these points, computer-assisted registration  was performed.  The computer system registration was performed by  obtaining a hip center location, bimalleolar axis as well as various  points about the knee.  Tibial cut was then made perpendicular  mechanical axis with collateral and the posterior structures protected.  Tensioner was placed in the extension and flexion.  Distal femoral cut  was then made and checked with spacer blocks.  Chamfer cuts were  performed with the collateral ligaments protected.  Box cut was made.  Tibial was keel punched.  Trial components were placed.  The patient had  full extension and full flexion with no anterior kick-outs of a 10 poly.  At this time, large loose bodies were removed from the posterior aspect  of the knee.  The patella was then prepared with a 10-mm resection and  32 trial button was placed with all trial components in position.  The  patient achieved full extension and full flexion with negative anterior  drawer.  She was stable to varus  and valgus stress at 0 to 39 degrees.  At this time, trial components were removed.  Cut bony surfaces were  irrigated.  True components were cemented into position with excess  cement removed.  The same stability parameters were maintained with true  components in position.  Tourniquet was released.  Bleeding points were  encountered and controlled with electrocautery.  Thorough irrigation was  performed.  Hemovac was placed.  Incision was closed over a bolster  using #1 Vicryl suture to reapproximate the arthrotomy followed by  interrupted inverted 0 Vicryl suture, 2-0 Vicryl suture, and skin  staples.  Incisions for the pins were irrigated and closed using 3-0  nylon.  At this time, bulky dressing and knee immobilizer was placed.  The patient tolerated the procedure well without immediate complication.  Velna Hatchet Vernon's assistance was required at all times during the case for   retraction of important neurovascular structures and limb positioning.  Her assistance was a medical necessity.      Burnard Bunting, M.D.  Electronically Signed     GSD/MEDQ  D:  08/21/2007  T:  08/22/2007  Job:  [id number]

## 2010-09-14 NOTE — Discharge Summary (Signed)
[name], [name]                 ACCOUNT NO.:  [id number]   MEDICAL RECORD NO.:  [id number]          PATIENT TYPE:  INP   LOCATION:  5010                         FACILITY:  [facility]   PHYSICIAN:  Burnard Bunting, M.D.    DATE OF BIRTH:  November 28, 1933   DATE OF ADMISSION:  08/21/2007  DATE OF DISCHARGE:  08/27/2007                               DISCHARGE SUMMARY   DISCHARGE DIAGNOSIS:  Right knee arthritis.   SECONDARY DIAGNOSES:  1. Hypertension.  2. Rheumatoid arthritis.  3. History of carpal tunnel syndrome.   OPERATIONS AND PROCEDURES:  Right total knee replacement performed on  August 21, 2007.   HOSPITAL COURSE:  [name] is a 77 year old patient with end-  stage knee arthritis, who presents for right total knee replacement.  She underwent total knee replacement on August 21, 2007, without  complications.  She tolerated the procedure well.  On postop day #2,  right foot was perfused and sensate.  She was started on Coumadin for  DVT prophylaxis, and CPM for range of motion.  Hemoglobin was 10.4 at  that time.  The patient's INR increased to 1.6 by postop day #2.  Incision was intact on postop day #3.  The patient had an otherwise  unremarkable recovery.  INR was therapeutic at the time of discharge.  UA was negative on August 25, 2007.  The patient was discharged to home  in good condition on August 27, 2007.   DISCHARGE MEDICATIONS:  1. Coumadin 5 mg p.o. daily for 4 weeks.  2. Percocet 1-2 p.o. q.2-4 h. p.r.n. pain.  3. Robaxin 500 mg p.o. q.6-8 h. p.r.n. spasms.  4. Hydrochlorothiazide 25 mg p.o. daily.  5. Atenolol 25 mg p.o. daily.   She will follow up with me in 7 days for suture removal.  Discharge in  good condition.      Burnard Bunting, M.D.  Electronically Signed     GSD/MEDQ  D:  09/26/2007  T:  09/27/2007  Job:  [id number]

## 2010-09-17 NOTE — Op Note (Signed)
[name], [name]                 ACCOUNT NO.:  [id number]   MEDICAL RECORD NO.:  [id number]          PATIENT TYPE:  OIB   LOCATION:  5002                         FACILITY:  [facility]   PHYSICIAN:  Burnard Bunting, M.D.    DATE OF BIRTH:  1934-04-26   DATE OF PROCEDURE:  07/28/2006  DATE OF DISCHARGE:  07/29/2006                               OPERATIVE REPORT   PREOPERATIVE DIAGNOSES:  1. Right ulnar nerve compression, cubital tunnel syndrome.  2. Right carpal tunnel syndrome.   POSTOPERATIVE DIAGNOSES:  1. Right ulnar nerve compression, cubital tunnel syndrome.  2. Right carpal tunnel syndrome.   PROCEDURE:  1. Right subcutaneous ulnar nerve transfer.  2. Right open carpal tunnel release.   SURGEON:  Dorene Grebe, M.D.   ASSISTANT:  Vear Clock, M.D.   ANESTHESIA:  General endotracheal.   ESTIMATED BLOOD LOSS:  Minimal.   TOURNIQUET TIME:  Approximately 1 hour and 15 minutes at 250 mmHg.   INDICATIONS FOR PROCEDURE:  The patient is a 77 year old female with  numbness and tingling in the hand with severe carpal tunnel syndrome and  cubital tunnel syndrome on EMG nerve conduction study testing.  She  presents now for operative management after explanation of risks and  benefit.   PROCEDURE IN DETAIL:  The patient was brought to the operating room  where general endotracheal anesthesia was induced.  Preoperative  antibiotics were administered.  The right arm, hand and elbow was  prepped and draped with Betadine solution in a sterile manner.  Sterile  tourniquet was utilized.  Eye bandages were used to cover the operative  field.  Topographical anatomy of the medial epicondyle and medial elbow  was noted beginning with the medial epicondyle, olecranon process and  expected course through the ulnar nerve.  The arm was elevated and  exsanguinated with an Esmarch wrap.  The tourniquet was inflated and  incision was made proximal to the epicondyle by about 8 cm and extending  distally about 6 cm.  The skin and subcutaneous tissue were sharply  divided.  Crossing veins were mobilized and spared when possible,  otherwise cauterized.  The medial antebrachial cutaneous nerve branches  were immobilized anteriorly.  The ulnar nerve was visualized proximally  and released from Osborne's ligament proximally and carried under direct  visualization.  The cubital tunnel was opened proximal to distal.  Dissection was performed to the first motor branch to the STU distally.  While maintaining vascular attachment, the nerve was transposed  anteriorly.  The intermuscular septum was excised and not detached at  its origin and this intermuscular septum was used to form a sling for  anterior transposition of the nerve.  Significant laxity existed without  nerve compression and creation of the sling.  The arm was taken through  a range of motion.  The nerve was found to remain out of the cubital  tunnel.  The nerve was fully released both proximally and distally.  Forearm fascia which required splitting for distal release was remained  open.  At this time that incision was packed with moist gauze and  wrap.  Attention was then directed toward the hand.  A separate incision was  made at the intersection of the Kaplan's cardinal line and the radial  border of the 4th finger.  This incision was taken down to the distal  wrist reflection.  The skin and subcutaneous tissue were sharply  divided.  Ulnar fascia was divided.  Transverse carpal ligament was  identified and, under direct visualization, split longitudinally in the  mid portion over 2-3 mm.  A Freer elevator was then placed between the  transverse carpal ligament and the median nerve.  The transverse carpal  ligament was then divided under direct visualization up proximally to  the forearm fascia and distally to the end of the transverse carpal  ligament.  Motor branch was inspected and found to be intact.  No other  masses  or cysts were noted in the carpal canal.  Hand incision was then  thoroughly irrigated.  The tourniquet was released.  Bleeders were  controlled with electrocautery and 0.25% Marcaine had been injected  through the skin edges were then closed using sterile nylon suture.  The  elbow incision was closed using an interrupted inverted 2-0 Vicryl  suture and a running 3-0 pull out Prolene.  Bulky well padded dressing  and posterior splint was applied.  The patient tolerated the procedure  well without immediate complications.  It should be noted that the  assistance of Dr. Tresa Res was required for this case for expert  retraction of the neurovascular structures.  His presence indicates a  medical necessity.      Burnard Bunting, M.D.  Electronically Signed     GSD/MEDQ  D:  08/16/2006  T:  08/17/2006  Job:  [id number]

## 2010-09-25 ENCOUNTER — Emergency Department (HOSPITAL_COMMUNITY): Payer: Medicare Other

## 2010-09-25 ENCOUNTER — Emergency Department (HOSPITAL_COMMUNITY)
Admission: EM | Admit: 2010-09-25 | Discharge: 2010-09-26 | Disposition: A | Discharge status: Home / Self Care | Payer: Medicare Other | Attending: Family Medicine | Admitting: Family Medicine

## 2010-09-25 DIAGNOSIS — G609 Hereditary and idiopathic neuropathy, unspecified: Secondary | ICD-10-CM | POA: Insufficient documentation

## 2010-09-25 DIAGNOSIS — W19XXXA Unspecified fall, initial encounter: Secondary | ICD-10-CM | POA: Insufficient documentation

## 2010-09-25 DIAGNOSIS — I10 Essential (primary) hypertension: Secondary | ICD-10-CM | POA: Insufficient documentation

## 2010-09-25 DIAGNOSIS — M25559 Pain in unspecified hip: Secondary | ICD-10-CM | POA: Insufficient documentation

## 2010-09-25 DIAGNOSIS — M129 Arthropathy, unspecified: Secondary | ICD-10-CM | POA: Insufficient documentation

## 2010-09-25 DIAGNOSIS — Y92009 Unspecified place in unspecified non-institutional (private) residence as the place of occurrence of the external cause: Secondary | ICD-10-CM | POA: Insufficient documentation

## 2010-09-25 DIAGNOSIS — S32509A Unspecified fracture of unspecified pubis, initial encounter for closed fracture: Secondary | ICD-10-CM | POA: Insufficient documentation

## 2011-01-25 LAB — CBC
HCT: 28.5 — ABNORMAL LOW
HCT: 30 — ABNORMAL LOW
HCT: 30 — ABNORMAL LOW
HCT: 41.1
Hemoglobin: 10.4 — ABNORMAL LOW
Hemoglobin: 10.5 — ABNORMAL LOW
Hemoglobin: 13.8
Hemoglobin: 9.9 — ABNORMAL LOW
MCHC: 33.4
MCHC: 34.8
MCHC: 34.8
MCHC: 34.9
MCV: 87.2
MCV: 87.3
MCV: 87.6
MCV: 88.4
Platelets: 154
Platelets: 155
Platelets: 171
Platelets: 216
RBC: 3.22 — ABNORMAL LOW
RBC: 3.43 — ABNORMAL LOW
RBC: 3.44 — ABNORMAL LOW
RBC: 4.71
RDW: 13.1
RDW: 13.1
RDW: 13.3
RDW: 13.4
WBC: 10.7 — ABNORMAL HIGH
WBC: 5.8
WBC: 8.6
WBC: 9.2

## 2011-01-25 LAB — URINALYSIS, ROUTINE W REFLEX MICROSCOPIC
Bilirubin Urine: NEGATIVE
Bilirubin Urine: NEGATIVE
Glucose, UA: NEGATIVE
Glucose, UA: NEGATIVE
Hgb urine dipstick: NEGATIVE
Hgb urine dipstick: NEGATIVE
Ketones, ur: NEGATIVE
Ketones, ur: NEGATIVE
Nitrite: NEGATIVE
Nitrite: POSITIVE — AB
Protein, ur: NEGATIVE
Protein, ur: NEGATIVE
Specific Gravity, Urine: 1.015
Specific Gravity, Urine: 1.02
Urobilinogen, UA: 0.2
Urobilinogen, UA: 1
pH: 5
pH: 7

## 2011-01-25 LAB — PROTIME-INR
INR: 1
INR: 1.3
INR: 1.4
INR: 1.8 — ABNORMAL HIGH
INR: 1.9 — ABNORMAL HIGH
INR: 2.1 — ABNORMAL HIGH
INR: 2.8 — ABNORMAL HIGH
Prothrombin Time: 13.5
Prothrombin Time: 16.6 — ABNORMAL HIGH
Prothrombin Time: 17.6 — ABNORMAL HIGH
Prothrombin Time: 21.6 — ABNORMAL HIGH
Prothrombin Time: 22.5 — ABNORMAL HIGH
Prothrombin Time: 24 — ABNORMAL HIGH
Prothrombin Time: 30.1 — ABNORMAL HIGH

## 2011-01-25 LAB — DIFFERENTIAL
Basophils Absolute: 0
Basophils Relative: 1
Eosinophils Absolute: 0.1
Eosinophils Relative: 2
Lymphocytes Relative: 42
Lymphs Abs: 2.4
Monocytes Absolute: 0.4
Monocytes Relative: 7
Neutro Abs: 2.8
Neutrophils Relative %: 49

## 2011-01-25 LAB — BASIC METABOLIC PANEL
BUN: 10
BUN: 14
BUN: 8
CO2: 28
CO2: 30
CO2: 31
Calcium: 8 — ABNORMAL LOW
Calcium: 8.7
Calcium: 9.5
Chloride: 102
Chloride: 98
Chloride: 98
Creatinine, Ser: 0.63
Creatinine, Ser: 0.78
Creatinine, Ser: 0.86
GFR calc Af Amer: >60
GFR calc Af Amer: >60
GFR calc Af Amer: >60
GFR calc non Af Amer: >60
GFR calc non Af Amer: >60
GFR calc non Af Amer: >60
Glucose, Bld: 110 — ABNORMAL HIGH
Glucose, Bld: 143 — ABNORMAL HIGH
Glucose, Bld: 97
Potassium: 3.9
Potassium: 3.9
Potassium: 4
Sodium: 135
Sodium: 137
Sodium: 138

## 2011-01-25 LAB — URINE MICROSCOPIC-ADD ON

## 2011-01-25 LAB — TYPE AND SCREEN
ABO/RH(D): A POS
Antibody Screen: NEGATIVE

## 2011-02-10 LAB — DIFFERENTIAL
Basophils Absolute: 0
Basophils Relative: 1
Eosinophils Absolute: 0.1
Eosinophils Relative: 1
Lymphocytes Relative: 37
Lymphs Abs: 3.2
Monocytes Absolute: 0.6
Monocytes Relative: 7
Neutro Abs: 4.8
Neutrophils Relative %: 55

## 2011-02-10 LAB — COMPREHENSIVE METABOLIC PANEL
ALT: 14
AST: 22
Albumin: 4
Alkaline Phosphatase: 65
BUN: 16
CO2: 31
Calcium: 9.9
Chloride: 98
Creatinine, Ser: 0.68
GFR calc Af Amer: >60
GFR calc non Af Amer: >60
Glucose, Bld: 103 — ABNORMAL HIGH
Potassium: 3.8
Sodium: 139
Total Bilirubin: 1
Total Protein: 7.3

## 2011-02-10 LAB — CBC
HCT: 40.8
Hemoglobin: 13.7
MCHC: 33.5
MCV: 86.4
Platelets: 215
RBC: 4.73
RDW: 12.7
WBC: 8.7

## 2011-02-10 LAB — LIPASE, BLOOD: Lipase: 31

## 2011-05-09 DIAGNOSIS — M7500 Adhesive capsulitis of unspecified shoulder: Secondary | ICD-10-CM | POA: Diagnosis not present

## 2011-05-09 DIAGNOSIS — M67919 Unspecified disorder of synovium and tendon, unspecified shoulder: Secondary | ICD-10-CM | POA: Diagnosis not present

## 2011-05-09 DIAGNOSIS — M719 Bursopathy, unspecified: Secondary | ICD-10-CM | POA: Diagnosis not present

## 2011-05-09 DIAGNOSIS — IMO0002 Reserved for concepts with insufficient information to code with codable children: Secondary | ICD-10-CM | POA: Diagnosis not present

## 2011-05-12 DIAGNOSIS — R9431 Abnormal electrocardiogram [ECG] [EKG]: Secondary | ICD-10-CM | POA: Diagnosis not present

## 2011-05-12 DIAGNOSIS — J811 Chronic pulmonary edema: Secondary | ICD-10-CM | POA: Diagnosis not present

## 2011-05-12 DIAGNOSIS — M79609 Pain in unspecified limb: Secondary | ICD-10-CM | POA: Diagnosis not present

## 2011-05-12 DIAGNOSIS — L97509 Non-pressure chronic ulcer of other part of unspecified foot with unspecified severity: Secondary | ICD-10-CM | POA: Diagnosis not present

## 2011-05-12 DIAGNOSIS — B351 Tinea unguium: Secondary | ICD-10-CM | POA: Diagnosis not present

## 2011-05-12 DIAGNOSIS — J40 Bronchitis, not specified as acute or chronic: Secondary | ICD-10-CM | POA: Diagnosis not present

## 2011-05-12 DIAGNOSIS — R509 Fever, unspecified: Secondary | ICD-10-CM | POA: Diagnosis not present

## 2011-05-12 DIAGNOSIS — J069 Acute upper respiratory infection, unspecified: Secondary | ICD-10-CM | POA: Diagnosis not present

## 2011-05-17 ENCOUNTER — Encounter: Payer: Self-pay | Admitting: *Deleted

## 2011-05-18 ENCOUNTER — Ambulatory Visit (INDEPENDENT_AMBULATORY_CARE_PROVIDER_SITE_OTHER): Payer: Medicare Other | Admitting: Cardiology

## 2011-05-18 ENCOUNTER — Encounter: Payer: Self-pay | Admitting: Cardiology

## 2011-05-18 VITALS — BP 116/73 | HR 64 | Wt 170.0 lb

## 2011-05-18 DIAGNOSIS — I10 Essential (primary) hypertension: Secondary | ICD-10-CM | POA: Diagnosis not present

## 2011-05-18 DIAGNOSIS — R9431 Abnormal electrocardiogram [ECG] [EKG]: Secondary | ICD-10-CM | POA: Diagnosis not present

## 2011-05-18 NOTE — Patient Instructions (Signed)
Your physician recommends that you schedule a follow-up appointment in: AS NEEDED PENDING TESTING  Your physician has requested that you have a lexiscan myoview. For further information please visit [email or web address] Please follow instruction sheet, as given.

## 2011-05-18 NOTE — Assessment & Plan Note (Signed)
Patient noted to have dynamic T-wave changes on electrocardiogram. Some dyspnea on exertion but no chest pain. Scheule myoview for risk stratification.

## 2011-05-18 NOTE — Assessment & Plan Note (Signed)
Blood pressure controlled. Continue present medications. 

## 2011-05-18 NOTE — Progress Notes (Signed)
HPI: 78 year old female for evaluation of abnormal electrocardiogram. Patient has no prior cardiac history. Recently seen by her primary care physician for URI symptoms. Electrocardiogram noted to have T-wave inversion in the septal leads. Patient does describe dyspnea on exertion but no orthopnea or PND. Occasional mild pedal edema which is chronic. No chest pain or syncope.  Current Outpatient Prescriptions  Medication Sig Dispense Refill  . amoxicillin (AMOXIL) 875 MG tablet Take 875 mg by mouth 2 (two) times daily.      Marland Kitchen atenolol (TENORMIN) 25 MG tablet Take 25 mg by mouth daily.      . benzonatate (TESSALON) 200 MG capsule Take 200 mg by mouth 3 (three) times daily as needed.      . Calcium-Vitamin D 600-200 MG-UNIT per tablet Take 1 tablet by mouth 3 (three) times daily with meals.      . gabapentin (NEURONTIN) 800 MG tablet Take 800 mg by mouth 3 (three) times daily.      . hydrochlorothiazide (HYDRODIURIL) 25 MG tablet Take 25 mg by mouth daily.      Marland Kitchen morphine (KADIAN) 30 MG 24 hr capsule Take 30 mg by mouth 3 (three) times daily.      Marland Kitchen morphine (MSIR) 15 MG tablet Take 15 mg by mouth 3 (three) times daily.        Allergies  Allergen Reactions  . Sulfonamide Derivatives     Past Medical History  Diagnosis Date  . Hypertension   . Rheumatoid arthritis   . S/P carpal tunnel release   . Diabetes mellitus     pre-diabetes  . Diverticulosis   . Osteoarthritis   . Nephrolithiasis     Past Surgical History  Procedure Date  . Total knee arthroplasty 08/21/2007    Right total knee replacement  . Carpal tunnel release 12/27/2006    Right subcutaneous ulnar nerve transfer -- Right open carpal tunnel release  . Foot surgery   . Cholecystectomy   . Appendectomy     History   Social History  . Marital Status: Divorced    Spouse Name: N/A    Number of Children: 6  . Years of Education: N/A   Occupational History  . Not on file.   Social History Main Topics  .  Smoking status: Never Smoker   . Smokeless tobacco: Not on file  . Alcohol Use: No  . Drug Use: Not on file  . Sexually Active: Not on file   Other Topics Concern  . Not on file   Social History Narrative  . No narrative on file    Family History  Problem Relation Age of Onset  . Stroke Father   . Coronary artery disease Son     CABG at age 56    ROS:  Recent URI and arthralgias but no hemoptysis, dysphasia, odynophagia, melena, hematochezia, dysuria, hematuria, rash, seizure activity, orthopnea, PND, claudication. Remaining systems are negative.  Physical Exam:   Blood pressure 116/73, pulse 64, weight 170 lb (77.111 kg).  General:  Well developed/well nourished in NAD Skin warm/dry Patient not depressed No peripheral clubbing Back-normal HEENT-normal/normal eyelids Neck supple/normal carotid upstroke bilaterally; no bruits; no JVD; no thyromegaly chest - CTA/ normal expansion CV - RRR/normal S1 and S2; no murmurs, rubs or gallops;  PMI nondisplaced Abdomen -NT/ND, no HSM, no mass, + bowel sounds, no bruit 2+ femoral pulses, no bruits Ext-trace edema, no chords, 2+ DP Neuro-grossly nonfocal  ECG - 05/12/2011 sinus rhythm at a rate of 58. T  wave inversion in septal leads.  Today's electrocardiogram shows sinus rhythm with no ST changes.

## 2011-05-24 ENCOUNTER — Ambulatory Visit (HOSPITAL_COMMUNITY): Payer: Medicare Other | Attending: Cardiology | Admitting: Radiology

## 2011-05-24 VITALS — BP 102/60 | Ht 60.0 in | Wt 167.0 lb

## 2011-05-24 DIAGNOSIS — R0602 Shortness of breath: Secondary | ICD-10-CM

## 2011-05-24 DIAGNOSIS — R9431 Abnormal electrocardiogram [ECG] [EKG]: Secondary | ICD-10-CM | POA: Diagnosis not present

## 2011-05-24 DIAGNOSIS — R0989 Other specified symptoms and signs involving the circulatory and respiratory systems: Secondary | ICD-10-CM | POA: Diagnosis not present

## 2011-05-24 DIAGNOSIS — R0609 Other forms of dyspnea: Secondary | ICD-10-CM | POA: Diagnosis not present

## 2011-05-24 DIAGNOSIS — R002 Palpitations: Secondary | ICD-10-CM | POA: Diagnosis not present

## 2011-05-24 DIAGNOSIS — E785 Hyperlipidemia, unspecified: Secondary | ICD-10-CM | POA: Diagnosis not present

## 2011-05-24 DIAGNOSIS — I10 Essential (primary) hypertension: Secondary | ICD-10-CM | POA: Insufficient documentation

## 2011-05-24 MED ORDER — TECHNETIUM TC 99M TETROFOSMIN IV KIT
11.0000 | PACK | Freq: Once | INTRAVENOUS | Status: AC | PRN
  Administered 2011-05-24: 11 via INTRAVENOUS

## 2011-05-24 MED ORDER — TECHNETIUM TC 99M TETROFOSMIN IV KIT
33.0000 | PACK | Freq: Once | INTRAVENOUS | Status: AC | PRN
  Administered 2011-05-24: 33 via INTRAVENOUS

## 2011-05-24 MED ORDER — REGADENOSON 0.4 MG/5ML IV SOLN
0.4000 mg | Freq: Once | INTRAVENOUS | Status: AC
  Administered 2011-05-24: 0.4 mg via INTRAVENOUS

## 2011-05-24 NOTE — Progress Notes (Signed)
[facility] SITE 3 NUCLEAR MED [address] [place] [address] [phone]  Cardiology Nuclear Med Study  [name] is a 78 y.o. female [id number] August 12, 1933   Nuclear Med Background Indication for Stress Test:  Evaluation for Ischemia and Abnormal EKG History:  No previous documented CAD and Abnormal EKG Cardiac Risk Factors: Hypertension and Lipids  Symptoms:  DOE, Palpitations and SOB   Nuclear Pre-Procedure Caffeine/Decaff Intake:  None NPO After: 10:00pm   Lungs: clear IV 0.9% NS with Angio Cath:  22g  IV Site: R Antecubital x 1, tolerated well IV Started by:  Irean Hong, RN  Chest Size (in):  42 Cup Size: C  Height: 5' (1.524 m)  Weight:  167 lb (75.751 kg)  BMI:  Body mass index is 32.62 kg/(m^2). Tech Comments:  Held atenolol x24 hrs    Nuclear Med Study 1 or 2 day study: 1 day  Stress Test Type:  Lexiscan  Reading MD: Willa Rough, MD  Order Authorizing Provider:  Olga Millers, MD  Resting Radionuclide: Technetium 73m Tetrofosmin  Resting Radionuclide Dose: 11.0 mCi   Stress Radionuclide:  Technetium 44m Tetrofosmin  Stress Radionuclide Dose: 33.0 mCi           Stress Protocol Rest HR: 61 Stress HR: 82  Rest BP: 102/60 Stress BP: 135/80  Exercise Time (min): n/a METS: n/a   Predicted Max HR: 145 bpm % Max HR: 56.55 bpm Rate Pressure Product: 21308   Dose of Adenosine (mg):  n/a Dose of Lexiscan: 0.4 mg  Dose of Atropine (mg): n/a Dose of Dobutamine: n/a mcg/kg/min (at max HR)  Stress Test Technologist: Milana Na, EMT-P  Nuclear Technologist:  Domenic Polite, CNMT     Rest Procedure:  Myocardial perfusion imaging was performed at rest 45 minutes following the intravenous administration of Technetium 71m Tetrofosmin.  Rest ECG: NSR with non-specific ST-T wave changes  Stress Procedure:  The patient received IV Lexiscan 0.4 mg over 15-seconds.  Technetium 8m Tetrofosmin injected at 30-seconds.  There were no  significant changes and rare pacs with Lexiscan.  Quantitative spect images were obtained after a 45 minute delay. Stress ECG: No significant change from baseline ECG  QPS Raw Data Images:  Patient motion noted; appropriate software correction applied. Stress Images:  Slight decrease in activity at the apex Rest Images: Normal  Subtraction (SDS):  Slight reversibility at the apex. Transient Ischemic Dilatation (Normal <1.22):  1.04 Lung/Heart Ratio (Normal <0.45):  0.46  Quantitative Gated Spect Images QGS EDV:  66 ml QGS ESV:  15 ml QGS cine images:  Normal Wall Motion QGS EF: 77%  Impression Exercise Capacity:  Lexiscan with no exercise. BP Response:  Normal blood pressure response. Clinical Symptoms:  SOB ECG Impression:  No significant ST segment change suggestive of ischemia. Comparison with Prior Nuclear Study: No images to compare  Overall Impression:  There is mild reversal of activity at the apex. This might represent mild ischemia at the apex.  Willa Rough, MD

## 2011-05-26 ENCOUNTER — Telehealth: Payer: Self-pay | Admitting: *Deleted

## 2011-05-26 NOTE — Telephone Encounter (Signed)
ERROR

## 2011-05-27 ENCOUNTER — Telehealth: Payer: Self-pay | Admitting: Cardiology

## 2011-05-27 NOTE — Telephone Encounter (Signed)
New Problem   Please return call to patient regarding 05/24/11 myoview test results. She can be reached at [phone] (Son Micheal Denny Levy)

## 2011-05-30 NOTE — Telephone Encounter (Signed)
Spoke with pt son, aware of stress results. Follow up appt made to discuss.

## 2011-06-02 ENCOUNTER — Ambulatory Visit (INDEPENDENT_AMBULATORY_CARE_PROVIDER_SITE_OTHER): Payer: Medicare Other | Admitting: Cardiology

## 2011-06-02 ENCOUNTER — Encounter: Payer: Self-pay | Admitting: Cardiology

## 2011-06-02 DIAGNOSIS — R9430 Abnormal result of cardiovascular function study, unspecified: Secondary | ICD-10-CM

## 2011-06-02 DIAGNOSIS — E785 Hyperlipidemia, unspecified: Secondary | ICD-10-CM | POA: Diagnosis not present

## 2011-06-02 DIAGNOSIS — I10 Essential (primary) hypertension: Secondary | ICD-10-CM | POA: Diagnosis not present

## 2011-06-02 NOTE — Assessment & Plan Note (Signed)
Blood pressure controlled. Continue present medications. 

## 2011-06-02 NOTE — Patient Instructions (Signed)
Your physician wants you to follow-up in: 6 MONTHS You will receive a reminder letter in the mail two months in advance. If you don't receive a letter, please call our office to schedule the follow-up appointment.   START ASPIRIN 81 MG ONCE DAILY WITH FOOD

## 2011-06-02 NOTE — Progress Notes (Signed)
   HPI: Pleasant female I saw in January of 2013 for an abnormal electrocardiogram. She had documented dynamic anterior T-wave changes. Myoview was performed in January 2013 and showed mild reversible defect at the apex. This was felt to possibly represent ischemia. Ejection fraction was 77%. Since I last saw her, the patient has dyspnea with more extreme activities but not with routine activities. It is relieved with rest. It is not associated with chest pain. There is no orthopnea, PND or pedal edema. There is no syncope or palpitations. There is no exertional chest pain.   Current Outpatient Prescriptions  Medication Sig Dispense Refill  . atenolol (TENORMIN) 25 MG tablet Take 25 mg by mouth daily.      . Calcium-Vitamin D 600-200 MG-UNIT per tablet Take 1 tablet by mouth 3 (three) times daily with meals.      . gabapentin (NEURONTIN) 800 MG tablet Take 800 mg by mouth 3 (three) times daily.      . hydrochlorothiazide (HYDRODIURIL) 25 MG tablet Take 25 mg by mouth daily.      Marland Kitchen morphine (KADIAN) 30 MG 24 hr capsule Take 30 mg by mouth 3 (three) times daily.      Marland Kitchen morphine (MSIR) 15 MG tablet Take 15 mg by mouth 3 (three) times daily.      Marland Kitchen aspirin EC 81 MG tablet Take 1 tablet (81 mg total) by mouth daily.  150 tablet  2     Past Medical History  Diagnosis Date  . Hypertension   . Rheumatoid arthritis   . S/P carpal tunnel release   . Diabetes mellitus     pre-diabetes  . Diverticulosis   . Osteoarthritis   . Nephrolithiasis     Past Surgical History  Procedure Date  . Total knee arthroplasty 08/21/2007    Right total knee replacement  . Carpal tunnel release 12/27/2006    Right subcutaneous ulnar nerve transfer -- Right open carpal tunnel release  . Foot surgery   . Cholecystectomy   . Appendectomy     History   Social History  . Marital Status: Divorced    Spouse Name: N/A    Number of Children: 6  . Years of Education: N/A   Occupational History  . Not on file.    Social History Main Topics  . Smoking status: Never Smoker   . Smokeless tobacco: Not on file  . Alcohol Use: No  . Drug Use: Not on file  . Sexually Active: Not on file   Other Topics Concern  . Not on file   Social History Narrative  . No narrative on file    ROS: no fevers or chills, productive cough, hemoptysis, dysphasia, odynophagia, melena, hematochezia, dysuria, hematuria, rash, seizure activity, orthopnea, PND, pedal edema, claudication. Remaining systems are negative.  Physical Exam: Well-developed well-nourished in no acute distress.  Skin is warm and dry.  HEENT is normal.  Neck is supple. No thyromegaly.  Chest is clear to auscultation with normal expansion.  Cardiovascular exam is regular rate and rhythm.  Abdominal exam nontender or distended. No masses palpated. Extremities show no edema. neuro grossly intact

## 2011-06-02 NOTE — Assessment & Plan Note (Signed)
I reviewed the patient's nuclear study with she and her son today. She is not having significant symptoms. I think her Myoview his low risk. It most likely represents shifting breast attenuation but mild distal anterior/apical ischemia cannot be excluded. Given that it is low risk and she is having no chest pain plan medical therapy. I will add enteric-coated aspirin 81 mg daily. If she develops symptoms in the future we could consider pursuing cardiac catheterization. Otherwise we can perform followup functional studies in the future. I will see her back in 6 months. She was instructed on symptoms of chest pain and increased dyspnea.

## 2011-06-02 NOTE — Assessment & Plan Note (Signed)
Management per primary care. 

## 2011-07-04 DIAGNOSIS — M25469 Effusion, unspecified knee: Secondary | ICD-10-CM | POA: Diagnosis not present

## 2011-07-04 DIAGNOSIS — M1710 Unilateral primary osteoarthritis, unspecified knee: Secondary | ICD-10-CM | POA: Diagnosis not present

## 2011-07-05 DIAGNOSIS — M810 Age-related osteoporosis without current pathological fracture: Secondary | ICD-10-CM | POA: Diagnosis not present

## 2011-07-05 DIAGNOSIS — E785 Hyperlipidemia, unspecified: Secondary | ICD-10-CM | POA: Diagnosis not present

## 2011-07-05 DIAGNOSIS — I10 Essential (primary) hypertension: Secondary | ICD-10-CM | POA: Diagnosis not present

## 2011-07-14 DIAGNOSIS — M86179 Other acute osteomyelitis, unspecified ankle and foot: Secondary | ICD-10-CM | POA: Diagnosis not present

## 2011-07-14 DIAGNOSIS — B351 Tinea unguium: Secondary | ICD-10-CM | POA: Diagnosis not present

## 2011-07-14 DIAGNOSIS — M79609 Pain in unspecified limb: Secondary | ICD-10-CM | POA: Diagnosis not present

## 2011-08-03 DIAGNOSIS — IMO0002 Reserved for concepts with insufficient information to code with codable children: Secondary | ICD-10-CM | POA: Diagnosis not present

## 2011-08-12 DIAGNOSIS — H26499 Other secondary cataract, unspecified eye: Secondary | ICD-10-CM | POA: Diagnosis not present

## 2011-08-12 DIAGNOSIS — Z961 Presence of intraocular lens: Secondary | ICD-10-CM | POA: Diagnosis not present

## 2011-08-12 DIAGNOSIS — E119 Type 2 diabetes mellitus without complications: Secondary | ICD-10-CM | POA: Diagnosis not present

## 2011-08-12 DIAGNOSIS — H1045 Other chronic allergic conjunctivitis: Secondary | ICD-10-CM | POA: Diagnosis not present

## 2011-08-15 DIAGNOSIS — M25539 Pain in unspecified wrist: Secondary | ICD-10-CM | POA: Diagnosis not present

## 2011-08-15 DIAGNOSIS — M25549 Pain in joints of unspecified hand: Secondary | ICD-10-CM | POA: Diagnosis not present

## 2011-09-06 DIAGNOSIS — M1710 Unilateral primary osteoarthritis, unspecified knee: Secondary | ICD-10-CM | POA: Diagnosis not present

## 2011-09-18 DIAGNOSIS — G4733 Obstructive sleep apnea (adult) (pediatric): Secondary | ICD-10-CM | POA: Diagnosis not present

## 2011-10-27 DIAGNOSIS — IMO0002 Reserved for concepts with insufficient information to code with codable children: Secondary | ICD-10-CM | POA: Diagnosis not present

## 2011-11-04 DIAGNOSIS — R0681 Apnea, not elsewhere classified: Secondary | ICD-10-CM | POA: Diagnosis not present

## 2011-11-04 DIAGNOSIS — R0902 Hypoxemia: Secondary | ICD-10-CM | POA: Diagnosis not present

## 2011-11-15 DIAGNOSIS — G4733 Obstructive sleep apnea (adult) (pediatric): Secondary | ICD-10-CM | POA: Diagnosis not present

## 2011-11-17 DIAGNOSIS — M86179 Other acute osteomyelitis, unspecified ankle and foot: Secondary | ICD-10-CM | POA: Diagnosis not present

## 2011-11-17 DIAGNOSIS — L97509 Non-pressure chronic ulcer of other part of unspecified foot with unspecified severity: Secondary | ICD-10-CM | POA: Diagnosis not present

## 2011-11-17 DIAGNOSIS — L02619 Cutaneous abscess of unspecified foot: Secondary | ICD-10-CM | POA: Diagnosis not present

## 2011-11-17 DIAGNOSIS — L03039 Cellulitis of unspecified toe: Secondary | ICD-10-CM | POA: Diagnosis not present

## 2011-12-08 DIAGNOSIS — M86179 Other acute osteomyelitis, unspecified ankle and foot: Secondary | ICD-10-CM | POA: Diagnosis not present

## 2011-12-12 DIAGNOSIS — G4733 Obstructive sleep apnea (adult) (pediatric): Secondary | ICD-10-CM | POA: Diagnosis not present

## 2011-12-12 DIAGNOSIS — G4734 Idiopathic sleep related nonobstructive alveolar hypoventilation: Secondary | ICD-10-CM | POA: Diagnosis not present

## 2011-12-14 DIAGNOSIS — M25569 Pain in unspecified knee: Secondary | ICD-10-CM | POA: Diagnosis not present

## 2011-12-14 DIAGNOSIS — M25519 Pain in unspecified shoulder: Secondary | ICD-10-CM | POA: Diagnosis not present

## 2011-12-16 DIAGNOSIS — M869 Osteomyelitis, unspecified: Secondary | ICD-10-CM | POA: Diagnosis not present

## 2011-12-16 DIAGNOSIS — L97509 Non-pressure chronic ulcer of other part of unspecified foot with unspecified severity: Secondary | ICD-10-CM | POA: Diagnosis not present

## 2011-12-16 DIAGNOSIS — I10 Essential (primary) hypertension: Secondary | ICD-10-CM | POA: Diagnosis not present

## 2011-12-16 DIAGNOSIS — M86179 Other acute osteomyelitis, unspecified ankle and foot: Secondary | ICD-10-CM | POA: Diagnosis not present

## 2011-12-22 DIAGNOSIS — M86179 Other acute osteomyelitis, unspecified ankle and foot: Secondary | ICD-10-CM | POA: Diagnosis not present

## 2011-12-22 DIAGNOSIS — IMO0002 Reserved for concepts with insufficient information to code with codable children: Secondary | ICD-10-CM | POA: Diagnosis not present

## 2012-01-17 ENCOUNTER — Telehealth: Payer: Self-pay | Admitting: Cardiology

## 2012-01-17 NOTE — Telephone Encounter (Signed)
New Problem:     I called the patient and was unable to reach them. I left a message on their voicemail with my name, the reason I called, the name of his physician, and a number to call back to schedule their appointment. 

## 2012-01-18 DIAGNOSIS — G4734 Idiopathic sleep related nonobstructive alveolar hypoventilation: Secondary | ICD-10-CM | POA: Diagnosis not present

## 2012-01-18 DIAGNOSIS — G4733 Obstructive sleep apnea (adult) (pediatric): Secondary | ICD-10-CM | POA: Diagnosis not present

## 2012-01-18 DIAGNOSIS — E678 Other specified hyperalimentation: Secondary | ICD-10-CM | POA: Diagnosis not present

## 2012-02-02 DIAGNOSIS — M779 Enthesopathy, unspecified: Secondary | ICD-10-CM | POA: Diagnosis not present

## 2012-02-02 DIAGNOSIS — B351 Tinea unguium: Secondary | ICD-10-CM | POA: Diagnosis not present

## 2012-02-02 DIAGNOSIS — M79609 Pain in unspecified limb: Secondary | ICD-10-CM | POA: Diagnosis not present

## 2012-02-06 DIAGNOSIS — E785 Hyperlipidemia, unspecified: Secondary | ICD-10-CM | POA: Diagnosis not present

## 2012-02-06 DIAGNOSIS — Z23 Encounter for immunization: Secondary | ICD-10-CM | POA: Diagnosis not present

## 2012-02-22 DIAGNOSIS — G5600 Carpal tunnel syndrome, unspecified upper limb: Secondary | ICD-10-CM | POA: Diagnosis not present

## 2012-02-22 DIAGNOSIS — M1710 Unilateral primary osteoarthritis, unspecified knee: Secondary | ICD-10-CM | POA: Diagnosis not present

## 2012-03-01 DIAGNOSIS — M79609 Pain in unspecified limb: Secondary | ICD-10-CM | POA: Diagnosis not present

## 2012-03-01 DIAGNOSIS — R209 Unspecified disturbances of skin sensation: Secondary | ICD-10-CM | POA: Diagnosis not present

## 2012-03-08 DIAGNOSIS — L02619 Cutaneous abscess of unspecified foot: Secondary | ICD-10-CM | POA: Diagnosis not present

## 2012-03-08 DIAGNOSIS — L03039 Cellulitis of unspecified toe: Secondary | ICD-10-CM | POA: Diagnosis not present

## 2012-03-09 DIAGNOSIS — M5030 Other cervical disc degeneration, unspecified cervical region: Secondary | ICD-10-CM | POA: Diagnosis not present

## 2012-03-13 DIAGNOSIS — IMO0002 Reserved for concepts with insufficient information to code with codable children: Secondary | ICD-10-CM | POA: Diagnosis not present

## 2012-03-15 DIAGNOSIS — M25519 Pain in unspecified shoulder: Secondary | ICD-10-CM | POA: Diagnosis not present

## 2012-03-21 DIAGNOSIS — M5412 Radiculopathy, cervical region: Secondary | ICD-10-CM | POA: Diagnosis not present

## 2012-03-21 DIAGNOSIS — M5020 Other cervical disc displacement, unspecified cervical region: Secondary | ICD-10-CM | POA: Diagnosis not present

## 2012-03-21 DIAGNOSIS — M4800 Spinal stenosis, site unspecified: Secondary | ICD-10-CM | POA: Diagnosis not present

## 2012-04-05 DIAGNOSIS — M86679 Other chronic osteomyelitis, unspecified ankle and foot: Secondary | ICD-10-CM | POA: Diagnosis not present

## 2012-04-12 DIAGNOSIS — M86179 Other acute osteomyelitis, unspecified ankle and foot: Secondary | ICD-10-CM | POA: Diagnosis not present

## 2012-04-18 DIAGNOSIS — D1630 Benign neoplasm of short bones of unspecified lower limb: Secondary | ICD-10-CM | POA: Diagnosis not present

## 2012-04-18 DIAGNOSIS — M86179 Other acute osteomyelitis, unspecified ankle and foot: Secondary | ICD-10-CM | POA: Diagnosis not present

## 2012-04-18 DIAGNOSIS — L089 Local infection of the skin and subcutaneous tissue, unspecified: Secondary | ICD-10-CM | POA: Diagnosis not present

## 2012-04-18 DIAGNOSIS — M869 Osteomyelitis, unspecified: Secondary | ICD-10-CM | POA: Diagnosis not present

## 2012-04-18 DIAGNOSIS — D2370 Other benign neoplasm of skin of unspecified lower limb, including hip: Secondary | ICD-10-CM | POA: Diagnosis not present

## 2012-04-18 DIAGNOSIS — I10 Essential (primary) hypertension: Secondary | ICD-10-CM | POA: Diagnosis not present

## 2012-04-26 DIAGNOSIS — M86179 Other acute osteomyelitis, unspecified ankle and foot: Secondary | ICD-10-CM | POA: Diagnosis not present

## 2012-05-17 DIAGNOSIS — B351 Tinea unguium: Secondary | ICD-10-CM | POA: Diagnosis not present

## 2012-05-17 DIAGNOSIS — M79609 Pain in unspecified limb: Secondary | ICD-10-CM | POA: Diagnosis not present

## 2012-05-22 DIAGNOSIS — M545 Low back pain, unspecified: Secondary | ICD-10-CM | POA: Diagnosis not present

## 2012-05-22 DIAGNOSIS — R413 Other amnesia: Secondary | ICD-10-CM | POA: Diagnosis not present

## 2012-05-22 DIAGNOSIS — M549 Dorsalgia, unspecified: Secondary | ICD-10-CM | POA: Diagnosis not present

## 2012-05-24 DIAGNOSIS — M1710 Unilateral primary osteoarthritis, unspecified knee: Secondary | ICD-10-CM | POA: Diagnosis not present

## 2012-06-11 DIAGNOSIS — IMO0002 Reserved for concepts with insufficient information to code with codable children: Secondary | ICD-10-CM | POA: Diagnosis not present

## 2012-06-29 ENCOUNTER — Encounter: Payer: Self-pay | Admitting: Family Medicine

## 2012-06-29 DIAGNOSIS — F0390 Unspecified dementia without behavioral disturbance: Secondary | ICD-10-CM

## 2012-06-29 DIAGNOSIS — M069 Rheumatoid arthritis, unspecified: Secondary | ICD-10-CM

## 2012-06-29 DIAGNOSIS — R7303 Prediabetes: Secondary | ICD-10-CM | POA: Insufficient documentation

## 2012-06-29 DIAGNOSIS — G4734 Idiopathic sleep related nonobstructive alveolar hypoventilation: Secondary | ICD-10-CM

## 2012-06-29 DIAGNOSIS — G4733 Obstructive sleep apnea (adult) (pediatric): Secondary | ICD-10-CM | POA: Insufficient documentation

## 2012-07-05 ENCOUNTER — Ambulatory Visit
Admission: RE | Admit: 2012-07-05 | Discharge: 2012-07-05 | Disposition: A | Discharge status: Home / Self Care | Payer: Medicare Other | Source: Ambulatory Visit | Attending: Family Medicine | Admitting: Family Medicine

## 2012-07-05 ENCOUNTER — Other Ambulatory Visit: Payer: Self-pay | Admitting: Family Medicine

## 2012-07-05 DIAGNOSIS — M545 Low back pain, unspecified: Secondary | ICD-10-CM

## 2012-07-05 DIAGNOSIS — M5137 Other intervertebral disc degeneration, lumbosacral region: Secondary | ICD-10-CM | POA: Diagnosis not present

## 2012-07-05 DIAGNOSIS — R52 Pain, unspecified: Secondary | ICD-10-CM

## 2012-07-05 DIAGNOSIS — M4120 Other idiopathic scoliosis, site unspecified: Secondary | ICD-10-CM | POA: Diagnosis not present

## 2012-07-05 DIAGNOSIS — R079 Chest pain, unspecified: Secondary | ICD-10-CM | POA: Diagnosis not present

## 2012-07-05 DIAGNOSIS — M4310 Spondylolisthesis, site unspecified: Secondary | ICD-10-CM | POA: Diagnosis not present

## 2012-07-05 DIAGNOSIS — R0602 Shortness of breath: Secondary | ICD-10-CM | POA: Diagnosis not present

## 2012-07-19 DIAGNOSIS — B351 Tinea unguium: Secondary | ICD-10-CM | POA: Diagnosis not present

## 2012-07-19 DIAGNOSIS — M79609 Pain in unspecified limb: Secondary | ICD-10-CM | POA: Diagnosis not present

## 2012-08-02 DIAGNOSIS — L02619 Cutaneous abscess of unspecified foot: Secondary | ICD-10-CM | POA: Diagnosis not present

## 2012-08-02 DIAGNOSIS — L03039 Cellulitis of unspecified toe: Secondary | ICD-10-CM | POA: Diagnosis not present

## 2012-08-16 DIAGNOSIS — M2040 Other hammer toe(s) (acquired), unspecified foot: Secondary | ICD-10-CM | POA: Diagnosis not present

## 2012-08-21 ENCOUNTER — Ambulatory Visit: Payer: Self-pay | Admitting: Family Medicine

## 2012-08-29 DIAGNOSIS — M1710 Unilateral primary osteoarthritis, unspecified knee: Secondary | ICD-10-CM | POA: Diagnosis not present

## 2012-10-11 DIAGNOSIS — B351 Tinea unguium: Secondary | ICD-10-CM | POA: Diagnosis not present

## 2012-10-11 DIAGNOSIS — M79609 Pain in unspecified limb: Secondary | ICD-10-CM | POA: Diagnosis not present

## 2012-10-24 DIAGNOSIS — E119 Type 2 diabetes mellitus without complications: Secondary | ICD-10-CM | POA: Diagnosis not present

## 2012-10-24 DIAGNOSIS — Z961 Presence of intraocular lens: Secondary | ICD-10-CM | POA: Diagnosis not present

## 2012-10-24 DIAGNOSIS — H04129 Dry eye syndrome of unspecified lacrimal gland: Secondary | ICD-10-CM | POA: Diagnosis not present

## 2012-12-05 DIAGNOSIS — M1710 Unilateral primary osteoarthritis, unspecified knee: Secondary | ICD-10-CM | POA: Diagnosis not present

## 2012-12-20 DIAGNOSIS — B351 Tinea unguium: Secondary | ICD-10-CM | POA: Diagnosis not present

## 2012-12-20 DIAGNOSIS — M79609 Pain in unspecified limb: Secondary | ICD-10-CM | POA: Diagnosis not present

## 2013-01-03 DIAGNOSIS — G619 Inflammatory polyneuropathy, unspecified: Secondary | ICD-10-CM | POA: Insufficient documentation

## 2013-01-03 DIAGNOSIS — G622 Polyneuropathy due to other toxic agents: Secondary | ICD-10-CM | POA: Insufficient documentation

## 2013-01-11 ENCOUNTER — Ambulatory Visit (INDEPENDENT_AMBULATORY_CARE_PROVIDER_SITE_OTHER): Payer: Medicare Other | Admitting: Family Medicine

## 2013-01-11 ENCOUNTER — Encounter: Payer: Self-pay | Admitting: Family Medicine

## 2013-01-11 VITALS — BP 122/64 | HR 74 | Temp 98.6°F | Resp 16 | Wt 170.0 lb

## 2013-01-11 DIAGNOSIS — IMO0002 Reserved for concepts with insufficient information to code with codable children: Secondary | ICD-10-CM

## 2013-01-11 DIAGNOSIS — S76111A Strain of right quadriceps muscle, fascia and tendon, initial encounter: Secondary | ICD-10-CM

## 2013-01-11 DIAGNOSIS — Z23 Encounter for immunization: Secondary | ICD-10-CM | POA: Diagnosis not present

## 2013-01-11 MED ORDER — HYDROCHLOROTHIAZIDE 25 MG PO TABS: 25.0000 mg | ORAL_TABLET | Freq: Every day | ORAL | Status: DC

## 2013-01-11 MED ORDER — ATENOLOL 25 MG PO TABS: 25.0000 mg | ORAL_TABLET | Freq: Every day | ORAL | Status: DC

## 2013-01-11 MED ORDER — CYCLOBENZAPRINE HCL 5 MG PO TABS: 5.0000 mg | ORAL_TABLET | Freq: Three times a day (TID) | ORAL | Status: DC | PRN

## 2013-01-11 NOTE — Progress Notes (Signed)
Subjective:    Patient ID: [name], female    DOB: 1933/09/09, 79 y.o.   MRN: [id number]  HPI Patient reports several weeks of pain in the anterior right thigh from the level of the pelvis down to her knee. It aches constantly. It is worse with resisted hip flexion and resisted knee extension. She has not fallen. There have been no injuries to the hip she is aware of the leg. There is no bruising or swelling. There is no erythema or warmth. She has good pulses in her right foot. There is no edema in her leg. She has full range of motion in the hip only minimal pain. The pain that is elicited with range of motion in the hip is in the anterior part of the right thigh along the quadriceps muscle. Past Medical History  Diagnosis Date  . Hypertension   . Rheumatoid arthritis(714.0)   . S/P carpal tunnel release   . Diabetes mellitus     pre-diabetes  . Diverticulosis   . Osteoarthritis   . Nephrolithiasis   . Osteoporosis   . Hyperlipidemia   . Dementia   . OSA on CPAP    Past Surgical History  Procedure Laterality Date  . Total knee arthroplasty  08/21/2007    Right total knee replacement  . Carpal tunnel release  12/27/2006    Right subcutaneous ulnar nerve transfer -- Right open carpal tunnel release  . Foot surgery    . Cholecystectomy    . Appendectomy    . Joint replacement      BTKR, RTHR   Current Outpatient Prescriptions on File Prior to Visit  Medication Sig Dispense Refill  . aspirin EC 81 MG tablet Take 1 tablet (81 mg total) by mouth daily.  150 tablet  2  . Calcium-Vitamin D 600-200 MG-UNIT per tablet Take 1 tablet by mouth 3 (three) times daily with meals.      . gabapentin (NEURONTIN) 800 MG tablet Take 800 mg by mouth 3 (three) times daily.      Marland Kitchen morphine (KADIAN) 30 MG 24 hr capsule Take 30 mg by mouth 3 (three) times daily.      Marland Kitchen morphine (MSIR) 15 MG tablet Take 15 mg by mouth 3 (three) times daily.      . simvastatin (ZOCOR) 20 MG tablet Take 20 mg by  mouth at bedtime.       No current facility-administered medications on file prior to visit.   Allergies  Allergen Reactions  . Sulfonamide Derivatives    History   Social History  . Marital Status: Divorced    Spouse Name: N/A    Number of Children: 6  . Years of Education: N/A   Occupational History  . Not on file.   Social History Main Topics  . Smoking status: Never Smoker   . Smokeless tobacco: Never Used  . Alcohol Use: No  . Drug Use: No  . Sexual Activity: Not on file   Other Topics Concern  . Not on file   Social History Narrative  . No narrative on file      Review of Systems  All other systems reviewed and are negative.       Objective:   Physical Exam  Vitals reviewed. Cardiovascular: Normal rate, regular rhythm, normal heart sounds and intact distal pulses.   Pulmonary/Chest: Effort normal and breath sounds normal. No respiratory distress. She has no wheezes. She has no rales.  Musculoskeletal: She exhibits no edema.  she is tender to palpation on the right anterior quadricep. She has pain worsened in the quadriceps muscle with resisted knee extension and resisted hip flexion        Assessment & Plan:  1. Quadriceps muscle strain, right, initial encounter I believe this represents a strain in the quadriceps muscle. I recommended moist heat 3 times a day by mouth recommended Flexeril 5 mg by mouth every 8 hours when necessary muscle pain. I cautioned the patient and her son against sedation on this medication especially given her chronic morphine use along with the gabapentin. She is taking it previously for pain in her back without complications and they would like to try again. If the pain is no better by next week I like to obtain an x-ray of the right femur along with the right hip. - cyclobenzaprine (FLEXERIL) 5 MG tablet; Take 1 tablet (5 mg total) by mouth every 8 (eight) hours as needed for muscle spasms.  Dispense: 30 tablet; Refill: 0

## 2013-01-15 ENCOUNTER — Other Ambulatory Visit: Payer: Self-pay | Admitting: Family Medicine

## 2013-01-15 ENCOUNTER — Telehealth: Payer: Self-pay | Admitting: Family Medicine

## 2013-01-15 MED ORDER — TIZANIDINE HCL 2 MG PO CAPS: 2.0000 mg | ORAL_CAPSULE | Freq: Three times a day (TID) | ORAL | Status: DC | PRN

## 2013-01-15 NOTE — Telephone Encounter (Signed)
We are not refilling muscle relaxer for 90 days.  90 day refill denied.

## 2013-01-15 NOTE — Telephone Encounter (Signed)
i escribed tizanidine to replace flexeril.

## 2013-01-15 NOTE — Telephone Encounter (Signed)
Insurance will not cover flexeril. Can you change her to something else?

## 2013-01-15 NOTE — Telephone Encounter (Signed)
Pt's son aware.

## 2013-02-28 ENCOUNTER — Ambulatory Visit: Payer: Medicare Other | Admitting: Podiatry

## 2013-03-05 ENCOUNTER — Encounter: Payer: Self-pay | Admitting: Podiatry

## 2013-03-05 ENCOUNTER — Ambulatory Visit (INDEPENDENT_AMBULATORY_CARE_PROVIDER_SITE_OTHER): Payer: Medicare Other | Admitting: Podiatry

## 2013-03-05 VITALS — BP 129/81 | HR 77 | Resp 14 | Ht 60.0 in | Wt 175.0 lb

## 2013-03-05 DIAGNOSIS — M79609 Pain in unspecified limb: Secondary | ICD-10-CM

## 2013-03-05 DIAGNOSIS — B351 Tinea unguium: Secondary | ICD-10-CM

## 2013-03-05 NOTE — Progress Notes (Signed)
  Subjective:    Patient ID: [name], female    DOB: December 03, 1933, 79 y.o.   MRN: [id number]  HPI    Review of Systems  Constitutional: Negative.   HENT: Negative.   Eyes:       Blurred vision  Respiratory: Negative.   Endocrine:       Pre diabetic  Allergic/Immunologic:       Medication allergy        Objective:   Physical Exam        Assessment & Plan:

## 2013-03-05 NOTE — Progress Notes (Signed)
[name] presents today with a chief complaint of painful elongated toenails.  Objective: Reviewed her past medical history medications and allergies. Vital signs are stable she is alert and oriented x3. Pulses are strongly palpable bilateral. Nails are thick yellow dystrophic clinically mycotic and painful on palpation.  Assessment: Pain in limb secondary to onychomycosis and diabetic neuropathy.  Plan: Debridement of nails in thickness and length as a covered service.

## 2013-03-07 DIAGNOSIS — Z79899 Other long term (current) drug therapy: Secondary | ICD-10-CM | POA: Diagnosis not present

## 2013-03-23 ENCOUNTER — Other Ambulatory Visit: Payer: Self-pay | Admitting: Podiatry

## 2013-03-25 ENCOUNTER — Other Ambulatory Visit: Payer: Self-pay | Admitting: Podiatry

## 2013-05-24 ENCOUNTER — Ambulatory Visit (INDEPENDENT_AMBULATORY_CARE_PROVIDER_SITE_OTHER): Payer: Medicare Other | Admitting: Family Medicine

## 2013-05-24 ENCOUNTER — Encounter: Payer: Self-pay | Admitting: Family Medicine

## 2013-05-24 VITALS — BP 120/82 | HR 68 | Temp 98.0°F | Resp 18 | Wt 174.0 lb

## 2013-05-24 DIAGNOSIS — R7309 Other abnormal glucose: Secondary | ICD-10-CM | POA: Diagnosis not present

## 2013-05-24 DIAGNOSIS — R739 Hyperglycemia, unspecified: Secondary | ICD-10-CM

## 2013-05-24 LAB — BASIC METABOLIC PANEL WITH GFR
BUN: 19 mg/dL (ref 6–23)
CO2: 29 mEq/L (ref 19–32)
Calcium: 9.5 mg/dL (ref 8.4–10.5)
Chloride: 100 mEq/L (ref 96–112)
Creat: 0.83 mg/dL (ref 0.50–1.10)
GFR, Est African American: 79 mL/min
GFR, Est Non African American: 68 mL/min
Glucose, Bld: 95 mg/dL (ref 70–99)
Potassium: 4.4 mEq/L (ref 3.5–5.3)
Sodium: 140 mEq/L (ref 135–145)

## 2013-05-24 LAB — HEMOGLOBIN A1C
Hgb A1c MFr Bld: 5.9 % — ABNORMAL HIGH (ref <5.7)
Mean Plasma Glucose: 123 mg/dL — ABNORMAL HIGH (ref <117)

## 2013-05-24 NOTE — Progress Notes (Signed)
Subjective:    Patient ID: [name], female    DOB: 12/24/1933, 80 y.o.   MRN: [id number]  HPI Patient had one episode where her blood sugar was 350. Otherwise she has not been checking her blood sugar. He denies any polyuria, polydipsia, or blurred vision. She does have a history of prediabetes. Her weight is stable since November. She denies any dizziness. She would like an evaluation for diabetes. Past Medical History  Diagnosis Date  . Hypertension   . Rheumatoid arthritis(714.0)   . S/P carpal tunnel release   . Diabetes mellitus     pre-diabetes  . Diverticulosis   . Osteoarthritis   . Nephrolithiasis   . Osteoporosis   . Hyperlipidemia   . Dementia   . OSA on CPAP    Past Surgical History  Procedure Laterality Date  . Total knee arthroplasty  08/21/2007    Right total knee replacement  . Carpal tunnel release  12/27/2006    Right subcutaneous ulnar nerve transfer -- Right open carpal tunnel release  . Foot surgery    . Cholecystectomy    . Appendectomy    . Joint replacement      BTKR, RTHR   Current Outpatient Prescriptions on File Prior to Visit  Medication Sig Dispense Refill  . aspirin EC 81 MG tablet Take 1 tablet (81 mg total) by mouth daily.  150 tablet  2  . atenolol (TENORMIN) 25 MG tablet Take 1 tablet (25 mg total) by mouth daily.  90 tablet  1  . Calcium-Vitamin D 600-200 MG-UNIT per tablet Take 1 tablet by mouth 3 (three) times daily with meals.      . gabapentin (NEURONTIN) 800 MG tablet TAKE 1 TABLET BY MOUTH EVERY MORNING, 1 TABLET AT LUNCH, 2 TABLETS EVERY NIGHT AT BEDTIME  360 tablet  0  . hydrochlorothiazide (HYDRODIURIL) 25 MG tablet Take 1 tablet (25 mg total) by mouth daily.  90 tablet  1  . oxymorphone (OPANA) 5 MG tablet       . simvastatin (ZOCOR) 20 MG tablet Take 20 mg by mouth at bedtime.       No current facility-administered medications on file prior to visit.   Allergies  Allergen Reactions  . Sulfonamide Derivatives     History   Social History  . Marital Status: Divorced    Spouse Name: N/A    Number of Children: 6  . Years of Education: N/A   Occupational History  . Not on file.   Social History Main Topics  . Smoking status: Never Smoker   . Smokeless tobacco: Never Used  . Alcohol Use: No  . Drug Use: No  . Sexual Activity: Not on file   Other Topics Concern  . Not on file   Social History Narrative  . No narrative on file      Review of Systems  All other systems reviewed and are negative.       Objective:   Physical Exam  Vitals reviewed. Neck: Neck supple. No JVD present.  Cardiovascular: Normal rate, regular rhythm and normal heart sounds.  Exam reveals no gallop and no friction rub.   No murmur heard. Pulmonary/Chest: Effort normal and breath sounds normal. No respiratory distress. She has no wheezes. She has no rales. She exhibits no tenderness.  Abdominal: Soft. Bowel sounds are normal. She exhibits no distension. There is no tenderness. There is no rebound and no guarding.  Musculoskeletal: She exhibits no edema.  Lymphadenopathy:  She has no cervical adenopathy.          Assessment & Plan:  1. Hyperglycemia Check a hemoglobin A1c as well as a BMP. I also asked the patient begin checking her fasting blood sugars as well as her two-hour postprandial sugars over the weekend. Based on these results Monday we will make a determination about the extent of her diabetes and what medication would be most appropriate to manage. If hemoglobin A1c is greater than 12 initially start with insulin. If it is less than 12 I would use a combination of oral medication. - BASIC METABOLIC PANEL WITH GFR - Hemoglobin A1c

## 2013-05-27 ENCOUNTER — Telehealth: Payer: Self-pay | Admitting: Family Medicine

## 2013-05-27 NOTE — Telephone Encounter (Signed)
Her BS reading for sat AM - 126 - PM 2 hours after supper 135 Sun - AM 122 - PM 135

## 2013-05-27 NOTE — Telephone Encounter (Signed)
..  Patient aware per vm 

## 2013-05-27 NOTE — Telephone Encounter (Signed)
A1c is outstanding.  No medication is needed at this time.

## 2013-05-28 ENCOUNTER — Ambulatory Visit (INDEPENDENT_AMBULATORY_CARE_PROVIDER_SITE_OTHER): Payer: Medicare Other | Admitting: Podiatry

## 2013-05-28 ENCOUNTER — Encounter: Payer: Self-pay | Admitting: Podiatry

## 2013-05-28 VITALS — BP 148/73 | HR 69 | Resp 16

## 2013-05-28 DIAGNOSIS — B351 Tinea unguium: Secondary | ICD-10-CM | POA: Diagnosis not present

## 2013-05-28 DIAGNOSIS — M79609 Pain in unspecified limb: Secondary | ICD-10-CM

## 2013-05-28 NOTE — Patient Instructions (Signed)
Diabetes and Foot Care Diabetes may cause you to have problems because of poor blood supply (circulation) to your feet and legs. This may cause the skin on your feet to become thinner, break easier, and heal more slowly. Your skin may become dry, and the skin may peel and crack. You may also have nerve damage in your legs and feet causing decreased feeling in them. You may not notice minor injuries to your feet that could lead to infections or more serious problems. Taking care of your feet is one of the most important things you can do for yourself.  HOME CARE INSTRUCTIONS  Wear shoes at all times, even in the house. Do not go barefoot. Bare feet are easily injured.  Check your feet daily for blisters, cuts, and redness. If you cannot see the bottom of your feet, use a mirror or ask someone for help.  Wash your feet with warm water (do not use hot water) and mild soap. Then pat your feet and the areas between your toes until they are completely dry. Do not soak your feet as this can dry your skin.  Apply a moisturizing lotion or petroleum jelly (that does not contain alcohol and is unscented) to the skin on your feet and to dry, brittle toenails. Do not apply lotion between your toes.  Trim your toenails straight across. Do not dig under them or around the cuticle. File the edges of your nails with an emery board or nail file.  Do not cut corns or calluses or try to remove them with medicine.  Wear clean socks or stockings every day. Make sure they are not too tight. Do not wear knee-high stockings since they may decrease blood flow to your legs.  Wear shoes that fit properly and have enough cushioning. To break in new shoes, wear them for just a few hours a day. This prevents you from injuring your feet. Always look in your shoes before you put them on to be sure there are no objects inside.  Do not cross your legs. This may decrease the blood flow to your feet.  If you find a minor scrape,  cut, or break in the skin on your feet, keep it and the skin around it clean and dry. These areas may be cleansed with mild soap and water. Do not cleanse the area with peroxide, alcohol, or iodine.  When you remove an adhesive bandage, be sure not to damage the skin around it.  If you have a wound, look at it several times a day to make sure it is healing.  Do not use heating pads or hot water bottles. They may burn your skin. If you have lost feeling in your feet or legs, you may not know it is happening until it is too late.  Make sure your health care provider performs a complete foot exam at least annually or more often if you have foot problems. Report any cuts, sores, or bruises to your health care provider immediately. SEEK MEDICAL CARE IF:   You have an injury that is not healing.  You have cuts or breaks in the skin.  You have an ingrown nail.  You notice redness on your legs or feet.  You feel burning or tingling in your legs or feet.  You have pain or cramps in your legs and feet.  Your legs or feet are numb.  Your feet always feel cold. SEEK IMMEDIATE MEDICAL CARE IF:   There is increasing redness,   swelling, or pain in or around a wound.  There is a red line that goes up your leg.  Pus is coming from a wound.  You develop a fever or as directed by your health care provider.  You notice a bad smell coming from an ulcer or wound. Document Released: 04/15/2000 Document Revised: 12/19/2012 Document Reviewed: 09/25/2012 [employer] Patient Information 2014 [employer].  

## 2013-05-28 NOTE — Progress Notes (Signed)
She presents today with a chief complaint of painful feet bilateral. States that her nails are long and painful.  Objective: Vital signs are stable she is alert and oriented x3. Pulses are palpable bilateral. Her nails are thick yellow dystrophic clinically mycotic and painful palpation as well as debridement.  Assessment: Pain in limb secondary to onychomycosis 1 through 5 bilateral.  Plan: Debridement of nails 1 through 5 bilateral is cover service secondary to pain.

## 2013-07-16 ENCOUNTER — Encounter: Payer: Self-pay | Admitting: Family Medicine

## 2013-07-16 ENCOUNTER — Ambulatory Visit (INDEPENDENT_AMBULATORY_CARE_PROVIDER_SITE_OTHER): Payer: Medicare Other | Admitting: Family Medicine

## 2013-07-16 VITALS — BP 132/78 | HR 72 | Temp 99.1°F | Resp 18 | Ht 60.0 in | Wt 178.0 lb

## 2013-07-16 DIAGNOSIS — J189 Pneumonia, unspecified organism: Secondary | ICD-10-CM

## 2013-07-16 MED ORDER — LEVOFLOXACIN 500 MG PO TABS: 500.0000 mg | ORAL_TABLET | Freq: Every day | ORAL | Status: DC

## 2013-07-16 NOTE — Progress Notes (Signed)
   Subjective:    Patient ID: [name], female    DOB: [birth year], 80 y.o.   MRN: [id number]  HPI  The patient had a severe cough for 3 days. The cough is productive of green sputum. She is having fevers of 99-100. She reports some sternal pleurisy.  She denies shortness of breath. She denies angina.  She denies hemoptysis. She denies rhinorrhea or sore throat or sinus pain. Past Medical History  Diagnosis Date  . Hypertension   . Rheumatoid arthritis(714.0)   . S/P carpal tunnel release   . Diabetes mellitus     pre-diabetes  . Diverticulosis   . Osteoarthritis   . Nephrolithiasis   . Osteoporosis   . Hyperlipidemia   . Dementia   . OSA on CPAP    Current Outpatient Prescriptions on File Prior to Visit  Medication Sig Dispense Refill  . aspirin EC 81 MG tablet Take 1 tablet (81 mg total) by mouth daily.  150 tablet  2  . atenolol (TENORMIN) 25 MG tablet Take 1 tablet (25 mg total) by mouth daily.  90 tablet  1  . Calcium-Vitamin D 600-200 MG-UNIT per tablet Take 1 tablet by mouth 3 (three) times daily with meals.      . gabapentin (NEURONTIN) 800 MG tablet TAKE 1 TABLET BY MOUTH EVERY MORNING, 1 TABLET AT LUNCH, 2 TABLETS EVERY NIGHT AT BEDTIME  360 tablet  0  . hydrochlorothiazide (HYDRODIURIL) 25 MG tablet Take 1 tablet (25 mg total) by mouth daily.  90 tablet  1  . oxymorphone (OPANA) 5 MG tablet 5 mg.       . simvastatin (ZOCOR) 20 MG tablet Take 20 mg by mouth at bedtime.       No current facility-administered medications on file prior to visit.   Allergies  Allergen Reactions  . Sulfonamide Derivatives    History   Social History  . Marital Status: Divorced    Spouse Name: N/A    Number of Children: 6  . Years of Education: N/A   Occupational History  . Not on file.   Social History Main Topics  . Smoking status: Never Smoker   . Smokeless tobacco: Never Used  . Alcohol Use: No  . Drug Use: No  . Sexual Activity: Not on file   Other Topics Concern    . Not on file   Social History Narrative  . No narrative on file     Review of Systems  All other systems reviewed and are negative.       Objective:   Physical Exam  Vitals reviewed. Constitutional: She appears well-developed and well-nourished. No distress.  HENT:  Right Ear: External ear normal.  Left Ear: External ear normal.  Nose: Nose normal.  Mouth/Throat: Oropharynx is clear and moist. No oropharyngeal exudate.  Eyes: Conjunctivae are normal. No scleral icterus.  Neck: Neck supple.  Cardiovascular: Normal rate, regular rhythm and normal heart sounds.   Pulmonary/Chest: Effort normal. She has rales.  Lymphadenopathy:    She has no cervical adenopathy.  Skin: She is not diaphoretic.   patient has left lower lobe crackles posteriorly        Assessment & Plan:  1. Walking pneumonia Begin Levaquin 500 mg by mouth daily for 7 days. Recheck in one week or sooner if worse. - levofloxacin (LEVAQUIN) 500 MG tablet; Take 1 tablet (500 mg total) by mouth daily.  Dispense: 7 tablet; Refill: 0

## 2013-07-23 ENCOUNTER — Ambulatory Visit (INDEPENDENT_AMBULATORY_CARE_PROVIDER_SITE_OTHER): Payer: Medicare Other | Admitting: Family Medicine

## 2013-07-23 ENCOUNTER — Encounter: Payer: Self-pay | Admitting: Family Medicine

## 2013-07-23 VITALS — BP 130/74 | HR 72 | Temp 97.7°F | Resp 18 | Ht 60.0 in | Wt 176.0 lb

## 2013-07-23 DIAGNOSIS — J189 Pneumonia, unspecified organism: Secondary | ICD-10-CM

## 2013-07-23 DIAGNOSIS — Z23 Encounter for immunization: Secondary | ICD-10-CM

## 2013-07-23 NOTE — Progress Notes (Signed)
Subjective:    Patient ID: [name], female    DOB: Jan 21, 1934, 80 y.o.   MRN: [id number]  HPI    Review of Systems  All other systems reviewed and are negative.       Objective:   Physical Exam  Vitals reviewed. Pulmonary/Chest: Effort normal and breath sounds normal. No respiratory distress. She has no wheezes. She has no rales. She exhibits no tenderness.          Assessment & Plan:     Subjective:    Patient ID: [name], female    DOB: November 10, 1933, 80 y.o.   MRN: [id number] 07/16/13 HPI  The patient had a severe cough for 3 days. The cough is productive of green sputum. She is having fevers of 99-100. She reports some sternal pleurisy.  She denies shortness of breath. She denies angina.  She denies hemoptysis. She denies rhinorrhea or sore throat or sinus pain.  At that time, my plan was: 1. Walking pneumonia Begin Levaquin 500 mg by mouth daily for 7 days. Recheck in one week or sooner if worse. - levofloxacin (LEVAQUIN) 500 MG tablet; Take 1 tablet (500 mg total) by mouth daily.  Dispense: 7 tablet; Refill: 0  07/23/13 Patient is here today for a recheck she is doing much better. She denies any fever. The cough is essentially gone. She denies any shortness of breath. She denies any pleurisy. She denies any hemoptysis. Past Medical History  Diagnosis Date  . Hypertension   . Rheumatoid arthritis(714.0)   . S/P carpal tunnel release   . Diabetes mellitus     pre-diabetes  . Diverticulosis   . Osteoarthritis   . Nephrolithiasis   . Osteoporosis   . Hyperlipidemia   . Dementia   . OSA on CPAP    Current Outpatient Prescriptions on File Prior to Visit  Medication Sig Dispense Refill  . aspirin EC 81 MG tablet Take 1 tablet (81 mg total) by mouth daily.  150 tablet  2  . atenolol (TENORMIN) 25 MG tablet Take 1 tablet (25 mg total) by mouth daily.  90 tablet  1  . Calcium-Vitamin D 600-200 MG-UNIT per tablet Take 1 tablet by mouth 3 (three) times  daily with meals.      . gabapentin (NEURONTIN) 800 MG tablet TAKE 1 TABLET BY MOUTH EVERY MORNING, 1 TABLET AT LUNCH, 2 TABLETS EVERY NIGHT AT BEDTIME  360 tablet  0  . hydrochlorothiazide (HYDRODIURIL) 25 MG tablet Take 1 tablet (25 mg total) by mouth daily.  90 tablet  1  . oxymorphone (OPANA) 5 MG tablet 5 mg.       . simvastatin (ZOCOR) 20 MG tablet Take 20 mg by mouth at bedtime.       No current facility-administered medications on file prior to visit.   Allergies  Allergen Reactions  . Sulfonamide Derivatives    History   Social History  . Marital Status: Divorced    Spouse Name: N/A    Number of Children: 6  . Years of Education: N/A   Occupational History  . Not on file.   Social History Main Topics  . Smoking status: Never Smoker   . Smokeless tobacco: Never Used  . Alcohol Use: No  . Drug Use: No  . Sexual Activity: Not on file   Other Topics Concern  . Not on file   Social History Narrative  . No narrative on file     Review of Systems  All other systems reviewed and are negative.       Objective:   Physical Exam  Vitals reviewed. Constitutional: She appears well-developed and well-nourished. No distress.  HENT:  Right Ear: External ear normal.  Left Ear: External ear normal.  Nose: Nose normal.  Mouth/Throat: Oropharynx is clear and moist. No oropharyngeal exudate.  Eyes: Conjunctivae are normal. No scleral icterus.  Neck: Neck supple.  Cardiovascular: Normal rate, regular rhythm and normal heart sounds.   Pulmonary/Chest: Effort normal.  Lymphadenopathy:    She has no cervical adenopathy.  Skin: She is not diaphoretic.   patient has left lower lobe crackles posteriorly        Assessment & Plan:   1. Walking pneumonia Clinically the patient has improved. I recommended that the patient continue to take Robitussin and I continue to encourage incentive spirometry until her cough has completely subsided.  I also gave the patient Pneumovax  23 today to update her pneumonia vaccine.

## 2013-07-23 NOTE — Addendum Note (Signed)
Addended by: Shary Decamp B on: 07/23/2013 10:36 AM   Modules accepted: Orders

## 2013-07-29 ENCOUNTER — Other Ambulatory Visit: Payer: Self-pay | Admitting: Family Medicine

## 2013-07-29 DIAGNOSIS — M810 Age-related osteoporosis without current pathological fracture: Secondary | ICD-10-CM

## 2013-08-01 ENCOUNTER — Other Ambulatory Visit: Payer: Self-pay | Admitting: Podiatry

## 2013-08-14 DIAGNOSIS — M25539 Pain in unspecified wrist: Secondary | ICD-10-CM | POA: Diagnosis not present

## 2013-08-14 DIAGNOSIS — M1710 Unilateral primary osteoarthritis, unspecified knee: Secondary | ICD-10-CM | POA: Diagnosis not present

## 2013-08-26 DIAGNOSIS — Z79899 Other long term (current) drug therapy: Secondary | ICD-10-CM | POA: Diagnosis not present

## 2013-08-27 ENCOUNTER — Ambulatory Visit (INDEPENDENT_AMBULATORY_CARE_PROVIDER_SITE_OTHER): Payer: Medicare Other

## 2013-08-27 ENCOUNTER — Ambulatory Visit (INDEPENDENT_AMBULATORY_CARE_PROVIDER_SITE_OTHER): Payer: Medicare Other | Admitting: Podiatry

## 2013-08-27 VITALS — BP 135/74 | HR 66 | Resp 18

## 2013-08-27 DIAGNOSIS — M722 Plantar fascial fibromatosis: Secondary | ICD-10-CM

## 2013-08-27 DIAGNOSIS — B351 Tinea unguium: Secondary | ICD-10-CM

## 2013-08-27 DIAGNOSIS — M79609 Pain in unspecified limb: Secondary | ICD-10-CM | POA: Diagnosis not present

## 2013-08-27 NOTE — Progress Notes (Signed)
She presents today with a chief complaint of painful toes one through 5 bilateral also pain in the right foot primarily at nighttime. She states that it feels like arthritis.  Objective: Vital signs are stable she is alert and oriented x3. Pulses are strongly palpable bilateral. Nails are thick yellow dystrophic with mycotic. History of amputation x2 right foot. Radiographic evaluation confirms this I see no osseous abnormalities other than osteoarthritis to the midfoot.  Assessment: Pain in limb secondary to onychomycosis 1 through 5 bilateral. Osteoarthritis right foot.  Plan: Debridement of nails 1 through 5 bilateral covered service secondary to pain. Followup with her in 3 months. Call sooner if needed.

## 2013-09-20 ENCOUNTER — Encounter: Payer: Self-pay | Admitting: Family Medicine

## 2013-09-20 ENCOUNTER — Ambulatory Visit (INDEPENDENT_AMBULATORY_CARE_PROVIDER_SITE_OTHER): Payer: Medicare Other | Admitting: Family Medicine

## 2013-09-20 VITALS — BP 120/74 | HR 74 | Temp 97.9°F | Resp 16 | Ht 60.0 in | Wt 174.0 lb

## 2013-09-20 DIAGNOSIS — R109 Unspecified abdominal pain: Secondary | ICD-10-CM

## 2013-09-20 LAB — URINALYSIS, MICROSCOPIC ONLY
Casts: NONE SEEN
Crystals: NONE SEEN

## 2013-09-20 LAB — URINALYSIS, ROUTINE W REFLEX MICROSCOPIC
Bilirubin Urine: NEGATIVE
Glucose, UA: NEGATIVE mg/dL
Hgb urine dipstick: NEGATIVE
Ketones, ur: NEGATIVE mg/dL
Nitrite: NEGATIVE
Protein, ur: NEGATIVE mg/dL
Specific Gravity, Urine: 1.025 (ref 1.005–1.030)
Urobilinogen, UA: 0.2 mg/dL (ref 0.0–1.0)
pH: 6 (ref 5.0–8.0)

## 2013-09-20 LAB — CBC W/MCH & 3 PART DIFF
HCT: 44.3 % (ref 36.0–46.0)
Hemoglobin: 14.6 g/dL (ref 12.0–15.0)
Lymphocytes Relative: 46 % (ref 12–46)
Lymphs Abs: 3.3 10*3/uL (ref 0.7–4.0)
MCH: 30.2 pg (ref 26.0–34.0)
MCHC: 33 g/dL (ref 30.0–36.0)
MCV: 91.7 fL (ref 78.0–100.0)
Neutro Abs: 3.3 10*3/uL (ref 1.7–7.7)
Neutrophils Relative %: 46 % (ref 43–77)
Platelets: 164 10*3/uL (ref 150–400)
RBC: 4.83 MIL/uL (ref 3.87–5.11)
RDW: 14.8 % (ref 11.5–15.5)
WBC mixed population %: 8 % (ref 3–18)
WBC mixed population: 0.5 10*3/uL (ref 0.1–1.8)
WBC: 7.1 10*3/uL (ref 4.0–10.5)

## 2013-09-20 MED ORDER — CIPROFLOXACIN HCL 500 MG PO TABS: 500.0000 mg | ORAL_TABLET | Freq: Two times a day (BID) | ORAL | Status: DC

## 2013-09-20 NOTE — Progress Notes (Signed)
Subjective:    Patient ID: [name], female    DOB: 1933-05-21, 80 y.o.   MRN: [id number]  HPI Patient has been having diffuse abdominal pain the last 3 days. She is also complaining of bilateral flank pain right greater than left. She has a right-sided CVA tenderness. She also complains of nausea and tenderness in the left lower quadrant and right lower quadrant. She denies any dysuria or hematuria. The pain is constant. There are no exacerbating or alleviating factors. She does have chronic back pain however this is different. She denies any hesitancy or urgency. She denies fevers or chills.  She denies cough. She denies any injury. She also complains of a constant headache for last 2 days. He is located in her occiput. He does not radiate. It is not pulsatile. There no exacerbating or alleviating factors. He is gradually improving last 2 days. She denies any blurry vision or double vision. Past Medical History  Diagnosis Date  . Hypertension   . Rheumatoid arthritis(714.0)   . S/P carpal tunnel release   . Diabetes mellitus     pre-diabetes  . Diverticulosis   . Osteoarthritis   . Nephrolithiasis   . Osteoporosis   . Hyperlipidemia   . Dementia   . OSA on CPAP    Past Surgical History  Procedure Laterality Date  . Total knee arthroplasty  08/21/2007    Right total knee replacement  . Carpal tunnel release  12/27/2006    Right subcutaneous ulnar nerve transfer -- Right open carpal tunnel release  . Foot surgery    . Cholecystectomy    . Appendectomy    . Joint replacement      BTKR, RTHR   Current Outpatient Prescriptions on File Prior to Visit  Medication Sig Dispense Refill  . aspirin EC 81 MG tablet Take 1 tablet (81 mg total) by mouth daily.  150 tablet  2  . atenolol (TENORMIN) 25 MG tablet Take 1 tablet (25 mg total) by mouth daily.  90 tablet  1  . Calcium-Vitamin D 600-200 MG-UNIT per tablet Take 1 tablet by mouth 3 (three) times daily with meals.      .  gabapentin (NEURONTIN) 800 MG tablet TAKE 1 TABLET BY MOUTH EVERY MORNING, 1 TABLET AT LUNCH AND 2 TABLETS EVERY NIGHT AT BEDTIME  360 tablet  0  . hydrochlorothiazide (HYDRODIURIL) 25 MG tablet Take 1 tablet (25 mg total) by mouth daily.  90 tablet  1  . meloxicam (MOBIC) 7.5 MG tablet       . oxymorphone (OPANA) 5 MG tablet 5 mg.       . simvastatin (ZOCOR) 20 MG tablet Take 20 mg by mouth at bedtime.       No current facility-administered medications on file prior to visit.   Allergies  Allergen Reactions  . Sulfonamide Derivatives    History   Social History  . Marital Status: Divorced    Spouse Name: N/A    Number of Children: 6  . Years of Education: N/A   Occupational History  . Not on file.   Social History Main Topics  . Smoking status: Never Smoker   . Smokeless tobacco: Never Used  . Alcohol Use: No  . Drug Use: No  . Sexual Activity: Not on file   Other Topics Concern  . Not on file   Social History Narrative  . No narrative on file      Review of Systems  All other systems  reviewed and are negative.      Objective:   Physical Exam  Vitals reviewed. Constitutional: She appears well-developed and well-nourished. No distress.  Eyes: No scleral icterus.  Neck: Neck supple.  Cardiovascular: Normal rate, regular rhythm and normal heart sounds.   Pulmonary/Chest: Effort normal and breath sounds normal. No respiratory distress. She has no wheezes. She has no rales.  Abdominal: Soft. Bowel sounds are normal. She exhibits no distension. There is tenderness. There is no rebound and no guarding.  Lymphadenopathy:    She has no cervical adenopathy.  Skin: She is not diaphoretic.  She is tender to palpation in the left lower quadrant of her stomach. She also has pronounced right-sided CVA tenderness.        Assessment & Plan:  1. Abdominal pain, unspecified site Stat cbc shows no leukocytosis (wbc 7).  UA is significant for pyruia.  I suspect  UTI/pyelonephritis although I cannot exclude diverticulitis.  Start Cipro 500 mg pobid for 10 days and recheck next week or sooner if worse. - Urinalysis, Routine w reflex microscopic - CBC w/[facility] & 3 Part Diff

## 2013-09-23 LAB — URINE CULTURE: Colony Count: >=100000

## 2013-09-24 ENCOUNTER — Other Ambulatory Visit: Payer: Self-pay | Admitting: Family Medicine

## 2013-09-24 ENCOUNTER — Telehealth: Payer: Self-pay | Admitting: Family Medicine

## 2013-09-24 MED ORDER — CEPHALEXIN 500 MG PO CAPS: 500.0000 mg | ORAL_CAPSULE | Freq: Three times a day (TID) | ORAL | Status: DC

## 2013-09-24 NOTE — Telephone Encounter (Signed)
Message copied by Olena Mater on Tue Sep 24, 2013  9:41 AM ------      Message from: Jenna Luo      Created: Tue Sep 24, 2013  7:06 AM       Patient has uti resistant to cipro, switch to keflex 500 tid for 7 days ASAP ------

## 2013-09-24 NOTE — Telephone Encounter (Signed)
Kelfex sent to pharmacy by provider this AM

## 2013-09-25 ENCOUNTER — Other Ambulatory Visit: Payer: Self-pay | Admitting: Family Medicine

## 2013-09-27 ENCOUNTER — Ambulatory Visit: Payer: Medicare Other | Admitting: Family Medicine

## 2013-09-30 ENCOUNTER — Ambulatory Visit (INDEPENDENT_AMBULATORY_CARE_PROVIDER_SITE_OTHER): Payer: Medicare Other | Admitting: Family Medicine

## 2013-09-30 ENCOUNTER — Encounter: Payer: Self-pay | Admitting: Family Medicine

## 2013-09-30 VITALS — BP 136/88 | HR 68 | Temp 98.7°F | Resp 20 | Ht 60.0 in | Wt 175.0 lb

## 2013-09-30 DIAGNOSIS — I714 Abdominal aortic aneurysm, without rupture, unspecified: Secondary | ICD-10-CM

## 2013-09-30 DIAGNOSIS — M5430 Sciatica, unspecified side: Secondary | ICD-10-CM

## 2013-09-30 DIAGNOSIS — M5431 Sciatica, right side: Secondary | ICD-10-CM

## 2013-09-30 HISTORY — DX: Abdominal aortic aneurysm, without rupture, unspecified: I71.40

## 2013-09-30 HISTORY — DX: Abdominal aortic aneurysm, without rupture: I71.4

## 2013-09-30 MED ORDER — PREDNISONE 20 MG PO TABS: ORAL_TABLET | ORAL | Status: DC

## 2013-09-30 MED ORDER — ATENOLOL 25 MG PO TABS: 25.0000 mg | ORAL_TABLET | Freq: Every day | ORAL | Status: DC

## 2013-09-30 NOTE — Progress Notes (Signed)
Subjective:    Patient ID: [name], female    DOB: 05/08/1933, 80 y.o.   MRN: [id number]  HPI 09/20/13 Patient has been having diffuse abdominal pain the last 3 days. She is also complaining of bilateral flank pain right greater than left. She has a right-sided CVA tenderness. She also complains of nausea and tenderness in the left lower quadrant and right lower quadrant. She denies any dysuria or hematuria. The pain is constant. There are no exacerbating or alleviating factors. She does have chronic back pain however this is different. She denies any hesitancy or urgency. She denies fevers or chills.  She denies cough. She denies any injury. She also complains of a constant headache for last 2 days. HA is located in her occiput. He does not radiate. It is not pulsatile. There no exacerbating or alleviating factors. He is gradually improving last 2 days. She denies any blurry vision or double vision.  At that time, my plan was: 1. Abdominal pain, unspecified site Stat cbc shows no leukocytosis (wbc 7).  UA is significant for pyruia.  I suspect UTI/pyelonephritis although I cannot exclude diverticulitis.  Start Cipro 500 mg pobid for 10 days and recheck next week or sooner if worse. - Urinalysis, Routine w reflex microscopic - CBC w/[facility] & 3 Part Diff  09/30/13 Urine culture confirmed urinary tract infection. Unfortunately the species was resistant to Cipro. This was the patient to Keflex.  After starting antibiotics, the abdominal pain, nausea, headache, and the symptoms of systemic illness subsided. She continues to have right-sided low back pain radiating into her right gluteus and down her right leg to just below the knee. The pain is intense and constant and worse with ambulation. Past Medical History  Diagnosis Date  . Hypertension   . Rheumatoid arthritis(714.0)   . S/P carpal tunnel release   . Diabetes mellitus     pre-diabetes  . Diverticulosis   . Osteoarthritis   .  Nephrolithiasis   . Osteoporosis   . Hyperlipidemia   . Dementia   . OSA on CPAP    Past Surgical History  Procedure Laterality Date  . Total knee arthroplasty  08/21/2007    Right total knee replacement  . Carpal tunnel release  12/27/2006    Right subcutaneous ulnar nerve transfer -- Right open carpal tunnel release  . Foot surgery    . Cholecystectomy    . Appendectomy    . Joint replacement      BTKR, RTHR   Current Outpatient Prescriptions on File Prior to Visit  Medication Sig Dispense Refill  . aspirin EC 81 MG tablet Take 1 tablet (81 mg total) by mouth daily.  150 tablet  2  . Calcium-Vitamin D 600-200 MG-UNIT per tablet Take 1 tablet by mouth 3 (three) times daily with meals.      . cephALEXin (KEFLEX) 500 MG capsule Take 1 capsule (500 mg total) by mouth 3 (three) times daily. X 7 days  21 capsule  0  . gabapentin (NEURONTIN) 800 MG tablet TAKE 1 TABLET BY MOUTH EVERY MORNING, 1 TABLET AT LUNCH AND 2 TABLETS EVERY NIGHT AT BEDTIME  360 tablet  0  . hydrochlorothiazide (HYDRODIURIL) 25 MG tablet TAKE 1 TABLET BY MOUTH EVERY DAY  90 tablet  3  . meloxicam (MOBIC) 7.5 MG tablet       . oxymorphone (OPANA) 5 MG tablet 5 mg.       . simvastatin (ZOCOR) 20 MG tablet Take 20 mg  by mouth at bedtime.       No current facility-administered medications on file prior to visit.   Allergies  Allergen Reactions  . Sulfonamide Derivatives    History   Social History  . Marital Status: Divorced    Spouse Name: N/A    Number of Children: 6  . Years of Education: N/A   Occupational History  . Not on file.   Social History Main Topics  . Smoking status: Never Smoker   . Smokeless tobacco: Never Used  . Alcohol Use: No  . Drug Use: No  . Sexual Activity: Not on file   Other Topics Concern  . Not on file   Social History Narrative  . No narrative on file      Review of Systems  All other systems reviewed and are negative.      Objective:   Physical Exam    Vitals reviewed. Constitutional: She is oriented to person, place, and time. She appears well-developed and well-nourished. No distress.  Eyes: No scleral icterus.  Neck: Neck supple.  Cardiovascular: Normal rate, regular rhythm and normal heart sounds.   Pulmonary/Chest: Effort normal and breath sounds normal. No respiratory distress. She has no wheezes. She has no rales.  Abdominal: Soft. Bowel sounds are normal. She exhibits no distension. There is no tenderness. There is no rebound and no guarding.  Musculoskeletal: She exhibits tenderness.       Lumbar back: She exhibits decreased range of motion and tenderness. She exhibits no bony tenderness.  Lymphadenopathy:    She has no cervical adenopathy.  Neurological: She is alert and oriented to person, place, and time. She has normal reflexes. She displays normal reflexes. No cranial nerve deficit. She exhibits normal muscle tone. Coordination normal.  Skin: She is not diaphoretic.         Assessment & Plan:   1. Sciatica of right side I do believe the patient had pyelonephritis. Clinically this seems to be resolved. However I believe she also has right-sided sciatica due to a herniated nucleus pulposus. Heart place the patient on a prednisone taper pack. Recheck in one week or sooner if worse. - predniSONE (DELTASONE) 20 MG tablet; 3 tabs poqday 1-2, 2 tabs poqday 3-4, 1 tab poqday 5-6  Dispense: 12 tablet; Refill: 0

## 2013-10-07 ENCOUNTER — Ambulatory Visit
Admission: RE | Admit: 2013-10-07 | Discharge: 2013-10-07 | Disposition: A | Discharge status: Home / Self Care | Payer: Medicare Other | Source: Ambulatory Visit | Attending: Family Medicine | Admitting: Family Medicine

## 2013-10-07 ENCOUNTER — Encounter: Payer: Self-pay | Admitting: Family Medicine

## 2013-10-07 ENCOUNTER — Ambulatory Visit (INDEPENDENT_AMBULATORY_CARE_PROVIDER_SITE_OTHER): Payer: Medicare Other | Admitting: Family Medicine

## 2013-10-07 VITALS — BP 128/76 | HR 68 | Temp 99.1°F | Resp 20 | Ht 60.0 in | Wt 174.0 lb

## 2013-10-07 DIAGNOSIS — M25559 Pain in unspecified hip: Secondary | ICD-10-CM | POA: Diagnosis not present

## 2013-10-07 DIAGNOSIS — M25551 Pain in right hip: Secondary | ICD-10-CM

## 2013-10-07 DIAGNOSIS — M5430 Sciatica, unspecified side: Secondary | ICD-10-CM | POA: Diagnosis not present

## 2013-10-07 DIAGNOSIS — M5431 Sciatica, right side: Secondary | ICD-10-CM

## 2013-10-07 DIAGNOSIS — M1610 Unilateral primary osteoarthritis, unspecified hip: Secondary | ICD-10-CM | POA: Diagnosis not present

## 2013-10-07 DIAGNOSIS — M169 Osteoarthritis of hip, unspecified: Secondary | ICD-10-CM | POA: Diagnosis not present

## 2013-10-07 NOTE — Progress Notes (Signed)
Subjective:    Patient ID: [name], female    DOB: 1934/02/15, 80 y.o.   MRN: [id number]  HPI 09/20/13 Patient has been having diffuse abdominal pain the last 3 days. She is also complaining of bilateral flank pain right greater than left. She has a right-sided CVA tenderness. She also complains of nausea and tenderness in the left lower quadrant and right lower quadrant. She denies any dysuria or hematuria. The pain is constant. There are no exacerbating or alleviating factors. She does have chronic back pain however this is different. She denies any hesitancy or urgency. She denies fevers or chills.  She denies cough. She denies any injury. She also complains of a constant headache for last 2 days. HA is located in her occiput. He does not radiate. It is not pulsatile. There no exacerbating or alleviating factors. He is gradually improving last 2 days. She denies any blurry vision or double vision.  At that time, my plan was: 1. Abdominal pain, unspecified site Stat cbc shows no leukocytosis (wbc 7).  UA is significant for pyruia.  I suspect UTI/pyelonephritis although I cannot exclude diverticulitis.  Start Cipro 500 mg pobid for 10 days and recheck next week or sooner if worse. - Urinalysis, Routine w reflex microscopic - CBC w/[facility] & 3 Part Diff  09/30/13 Urine culture confirmed urinary tract infection. Unfortunately the species was resistant to Cipro. This was the patient to Keflex.  After starting antibiotics, the abdominal pain, nausea, headache, and the symptoms of systemic illness subsided. She continues to have right-sided low back pain radiating into her right gluteus and down her right leg to just below the knee. The pain is intense and constant and worse with ambulation.  At that time, my plan was: 1. Sciatica of right side I do believe the patient had pyelonephritis. Clinically this seems to be resolved. However I believe she also has right-sided sciatica due to a herniated nucleus  pulposus. Heart place the patient on a prednisone taper pack. Recheck in one week or sooner if worse. - predniSONE (DELTASONE) 20 MG tablet; 3 tabs poqday 1-2, 2 tabs poqday 3-4, 1 tab poqday 5-6  Dispense: 12 tablet; Refill: 0  10/07/13  the patient is no better. She continued to have pain radiating from her right lower back into her right gluteus and down the lateral aspect of her right thigh.  She has decreased range of motion in her lower back as well as in her right side. She is walking with an antalgic gait. She denies any weakness in the legs. She denies any numbness or tingling in the legs. Prednisone did not help at all. She is also tender palpation over the lateral and anterior quadriceps muscle. She is tender to palpation in the right lower back and in the right gluteus. Past Medical History  Diagnosis Date  . Hypertension   . Rheumatoid arthritis(714.0)   . S/P carpal tunnel release   . Diabetes mellitus     pre-diabetes  . Diverticulosis   . Osteoarthritis   . Nephrolithiasis   . Osteoporosis   . Hyperlipidemia   . Dementia   . OSA on CPAP    Past Surgical History  Procedure Laterality Date  . Total knee arthroplasty  08/21/2007    Right total knee replacement  . Carpal tunnel release  12/27/2006    Right subcutaneous ulnar nerve transfer -- Right open carpal tunnel release  . Foot surgery    . Cholecystectomy    .  Appendectomy    . Joint replacement      BTKR, RTHR   Current Outpatient Prescriptions on File Prior to Visit  Medication Sig Dispense Refill  . aspirin EC 81 MG tablet Take 1 tablet (81 mg total) by mouth daily.  150 tablet  2  . atenolol (TENORMIN) 25 MG tablet Take 1 tablet (25 mg total) by mouth daily.  90 tablet  1  . Calcium-Vitamin D 600-200 MG-UNIT per tablet Take 1 tablet by mouth 3 (three) times daily with meals.      . gabapentin (NEURONTIN) 800 MG tablet TAKE 1 TABLET BY MOUTH EVERY MORNING, 1 TABLET AT LUNCH AND 2 TABLETS EVERY NIGHT AT BEDTIME   360 tablet  0  . hydrochlorothiazide (HYDRODIURIL) 25 MG tablet TAKE 1 TABLET BY MOUTH EVERY DAY  90 tablet  3  . meloxicam (MOBIC) 7.5 MG tablet       . oxymorphone (OPANA) 5 MG tablet 5 mg.       . simvastatin (ZOCOR) 20 MG tablet Take 20 mg by mouth at bedtime.       No current facility-administered medications on file prior to visit.   Allergies  Allergen Reactions  . Sulfonamide Derivatives    History   Social History  . Marital Status: Divorced    Spouse Name: N/A    Number of Children: 6  . Years of Education: N/A   Occupational History  . Not on file.   Social History Main Topics  . Smoking status: Never Smoker   . Smokeless tobacco: Never Used  . Alcohol Use: No  . Drug Use: No  . Sexual Activity: Not on file   Other Topics Concern  . Not on file   Social History Narrative  . No narrative on file      Review of Systems  All other systems reviewed and are negative.      Objective:   Physical Exam  Vitals reviewed. Constitutional: She is oriented to person, place, and time. She appears well-developed and well-nourished. No distress.  Eyes: No scleral icterus.  Neck: Neck supple.  Cardiovascular: Normal rate, regular rhythm and normal heart sounds.   Pulmonary/Chest: Effort normal and breath sounds normal. No respiratory distress. She has no wheezes. She has no rales.  Abdominal: Soft. Bowel sounds are normal. She exhibits no distension. There is no tenderness. There is no rebound and no guarding.  Musculoskeletal: She exhibits tenderness.       Right hip: She exhibits decreased range of motion and tenderness. She exhibits normal strength and no bony tenderness.       Lumbar back: She exhibits decreased range of motion, tenderness and pain. She exhibits no bony tenderness.  Lymphadenopathy:    She has no cervical adenopathy.  Neurological: She is alert and oriented to person, place, and time. She has normal reflexes. No cranial nerve deficit. She  exhibits normal muscle tone. Coordination normal.  Skin: She is not diaphoretic.         Assessment & Plan:   1. Hip pain, right - DG Hip Complete Right; Future  2. Sciatica of right side,  Proceed with an x-ray of the right hip. I am performing this x-ray because of the pain she is having with range of motion the right hip. I still consider the most likely source to be a herniated disc in her back creating right-sided sciatica. If x-ray of the right hip is essentially normal, I would proceed with an MRI of the  lumbar spine. The patient may benefit from epidural steroid injections. I recommended that she contact her pain specialist to see if they will approve her temporary analgesics to help cope with her pain.

## 2013-10-10 ENCOUNTER — Other Ambulatory Visit: Payer: Self-pay | Admitting: Family Medicine

## 2013-10-10 DIAGNOSIS — M5431 Sciatica, right side: Secondary | ICD-10-CM

## 2013-10-18 ENCOUNTER — Ambulatory Visit
Admission: RE | Admit: 2013-10-18 | Discharge: 2013-10-18 | Disposition: A | Discharge status: Home / Self Care | Payer: Medicare Other | Source: Ambulatory Visit | Attending: Family Medicine | Admitting: Family Medicine

## 2013-10-18 DIAGNOSIS — M5431 Sciatica, right side: Secondary | ICD-10-CM

## 2013-10-18 DIAGNOSIS — M47817 Spondylosis without myelopathy or radiculopathy, lumbosacral region: Secondary | ICD-10-CM | POA: Diagnosis not present

## 2013-10-18 DIAGNOSIS — M5137 Other intervertebral disc degeneration, lumbosacral region: Secondary | ICD-10-CM | POA: Diagnosis not present

## 2013-10-18 DIAGNOSIS — M48061 Spinal stenosis, lumbar region without neurogenic claudication: Secondary | ICD-10-CM | POA: Diagnosis not present

## 2013-10-21 ENCOUNTER — Encounter: Payer: Self-pay | Admitting: Family Medicine

## 2013-10-22 ENCOUNTER — Other Ambulatory Visit: Payer: Self-pay | Admitting: Family Medicine

## 2013-10-22 DIAGNOSIS — M48061 Spinal stenosis, lumbar region without neurogenic claudication: Secondary | ICD-10-CM

## 2013-10-28 ENCOUNTER — Other Ambulatory Visit: Payer: Self-pay | Admitting: Podiatry

## 2013-10-30 DIAGNOSIS — M545 Low back pain, unspecified: Secondary | ICD-10-CM | POA: Diagnosis not present

## 2013-11-08 ENCOUNTER — Encounter: Payer: Self-pay | Admitting: Vascular Surgery

## 2013-11-08 ENCOUNTER — Other Ambulatory Visit: Payer: Self-pay

## 2013-11-08 DIAGNOSIS — I714 Abdominal aortic aneurysm, without rupture, unspecified: Secondary | ICD-10-CM

## 2013-11-13 DIAGNOSIS — M545 Low back pain, unspecified: Secondary | ICD-10-CM | POA: Diagnosis not present

## 2013-11-18 DIAGNOSIS — M5416 Radiculopathy, lumbar region: Secondary | ICD-10-CM | POA: Insufficient documentation

## 2013-11-18 DIAGNOSIS — M5126 Other intervertebral disc displacement, lumbar region: Secondary | ICD-10-CM | POA: Insufficient documentation

## 2013-11-21 ENCOUNTER — Encounter: Payer: Self-pay | Admitting: Cardiology

## 2013-11-21 ENCOUNTER — Ambulatory Visit (INDEPENDENT_AMBULATORY_CARE_PROVIDER_SITE_OTHER): Payer: Medicare Other | Admitting: Cardiology

## 2013-11-21 VITALS — BP 115/73 | HR 68 | Ht 61.0 in | Wt 181.0 lb

## 2013-11-21 DIAGNOSIS — G4733 Obstructive sleep apnea (adult) (pediatric): Secondary | ICD-10-CM | POA: Diagnosis not present

## 2013-11-21 DIAGNOSIS — I10 Essential (primary) hypertension: Secondary | ICD-10-CM | POA: Diagnosis not present

## 2013-11-21 DIAGNOSIS — Z9989 Dependence on other enabling machines and devices: Secondary | ICD-10-CM

## 2013-11-21 DIAGNOSIS — R9431 Abnormal electrocardiogram [ECG] [EKG]: Secondary | ICD-10-CM

## 2013-11-21 DIAGNOSIS — E785 Hyperlipidemia, unspecified: Secondary | ICD-10-CM

## 2013-11-21 NOTE — Assessment & Plan Note (Signed)
Followed by PCP. Lipid Panel     Component Value Date/Time   CHOL 200 02/21/2008 2010   TRIG 201* 02/21/2008 2010   HDL 37* 02/21/2008 2010   CHOLHDL 5.4 Ratio 02/21/2008 2010   VLDL 40 02/21/2008 2010   LDLCALC 123* 02/21/2008 2010

## 2013-11-21 NOTE — Assessment & Plan Note (Signed)
stable °

## 2013-11-21 NOTE — Progress Notes (Signed)
11/21/2013   PCP: Odette Fraction, MD   Chief Complaint  Patient presents with  . Appointment    rountine check pt has had problems with her back otherwise no  chest pain-no sob-     Primary Cardiologist:Dr. Thresa Ross   HPI:  80 year old female seen by Dr. Stanford Breed January of 2013 for an abnormal electrocardiogram. She had documented dynamic anterior T-wave changes. Myoview was performed in January 2013 and showed mild reversible defect at the apex. This was felt to possibly represent ischemia. Ejection fraction was 77%. Today she denies SOB and chest pain.  There is no orthopnea, PND + pedal edema - she tells me she has frequently. There is no syncope or palpitations. There is no exertional chest pain.  She does have back pain and sciatica and has been evaluated with plan for injection.  If no improvement may need surgery in the future.  Dr. Dennard Schaumann follows her lipids. Has new dx of [name]. Small- u/s scheduled.   Allergies  Allergen Reactions  . Sulfonamide Derivatives     Current Outpatient Prescriptions  Medication Sig Dispense Refill  . aspirin EC 81 MG tablet Take 1 tablet (81 mg total) by mouth daily.  150 tablet  2  . atenolol (TENORMIN) 25 MG tablet Take 1 tablet (25 mg total) by mouth daily.  90 tablet  1  . Calcium-Vitamin D 600-200 MG-UNIT per tablet Take 1 tablet by mouth 3 (three) times daily with meals.      . gabapentin (NEURONTIN) 800 MG tablet TAKE 1 TABLET BY MOUTH EVERY MORNING, 1 TABLET AT LUNCH AND 2 TABLETS EVERY NIGHT AT BEDTIME  360 tablet  0  . hydrochlorothiazide (HYDRODIURIL) 25 MG tablet TAKE 1 TABLET BY MOUTH EVERY DAY  90 tablet  3  . meloxicam (MOBIC) 7.5 MG tablet       . Omega-3 Fatty Acids (FISH OIL) 1000 MG CAPS Take by mouth. 1 tab twice a day      . oxymorphone (OPANA) 5 MG tablet 5 mg.       . simvastatin (ZOCOR) 20 MG tablet Take 20 mg by mouth at bedtime.       No current facility-administered medications for this visit.     Past Medical History  Diagnosis Date  . Hypertension   . Rheumatoid arthritis(714.0)   . S/P carpal tunnel release   . Diabetes mellitus     pre-diabetes  . Diverticulosis   . Osteoarthritis   . Nephrolithiasis   . Osteoporosis   . Hyperlipidemia   . Dementia   . OSA on CPAP   . [name] (abdominal aortic aneurysm) 09/2013    3.6 cm  . Spinal stenosis     Past Surgical History  Procedure Laterality Date  . Total knee arthroplasty  08/21/2007    Right total knee replacement  . Carpal tunnel release  12/27/2006    Right subcutaneous ulnar nerve transfer -- Right open carpal tunnel release  . Foot surgery    . Cholecystectomy    . Appendectomy    . Joint replacement      BTKR, RTHR    PFX:TKWIOXB:DZ colds or fevers, no weight changes Skin:no rashes or ulcers HEENT:no blurred vision, no congestion CV:see HPI PUL:see HPI GI:no diarrhea constipation or melena, no indigestion GU:no hematuria, no dysuria MS:no joint pain, no claudication, significant back pain, now to proceed with injections for HNP with sciatica down rt leg and flattened disc  may need surgery in the future.  Neuro:no syncope, no lightheadedness Endo:no diabetes, no thyroid disease  Wt Readings from Last 3 Encounters:  11/21/13 181 lb (82.101 kg)  10/07/13 174 lb (78.926 kg)  09/30/13 175 lb (79.379 kg)    PHYSICAL EXAM BP 115/73  Pulse 68  Ht 5\' 1"  (1.549 m)  Wt 181 lb (82.101 kg)  BMI 34.22 kg/m2 General:Pleasant affect, NAD Skin:Warm and dry, brisk capillary refill HEENT:normocephalic, sclera clear, mucus membranes moist Neck:supple, no JVD, no bruits  Heart:S1S2 RRR without murmur, gallup, rub or click Lungs:clear without rales, rhonchi, or wheezes DGL:OVFI, non tender, + BS, do not palpate liver spleen or masses Ext: tr lower ext edema, 2+ pedal pulses, 2+ radial pulses Neuro:alert and oriented X 3, MAE, follows commands, + facial symmetry  EKG:SR of 68, no acute changes.  ASSESSMENT  AND PLAN Abnormal EKG Stable with neg nuc in 12/14, though if any pain would cath at this point with attenuation on nuc.  No chest pain, denies SOB today.   HYPERTENSION controlled  OSA on CPAP stable  HYPERLIPIDEMIA Followed by PCP. Lipid Panel     Component Value Date/Time   CHOL 200 02/21/2008 2010   TRIG 201* 02/21/2008 2010   HDL 37* 02/21/2008 2010   CHOLHDL 5.4 Ratio 02/21/2008 2010   VLDL 40 02/21/2008 2010   LDLCALC 123* 02/21/2008 2010

## 2013-11-21 NOTE — Patient Instructions (Signed)
Continue current medications.  Your physician recommends that you schedule a follow-up appointment in: 6 months with Dr. Stanford Breed.

## 2013-11-21 NOTE — Assessment & Plan Note (Signed)
Stable with neg nuc in 12/14, though if any pain would cath at this point with attenuation on nuc.  No chest pain, denies SOB today.

## 2013-11-21 NOTE — Assessment & Plan Note (Signed)
controlled 

## 2013-11-26 ENCOUNTER — Encounter: Payer: Self-pay | Admitting: Podiatry

## 2013-11-26 ENCOUNTER — Ambulatory Visit (INDEPENDENT_AMBULATORY_CARE_PROVIDER_SITE_OTHER): Payer: Medicare Other | Admitting: Podiatry

## 2013-11-26 DIAGNOSIS — M79676 Pain in unspecified toe(s): Secondary | ICD-10-CM

## 2013-11-26 DIAGNOSIS — B351 Tinea unguium: Secondary | ICD-10-CM | POA: Diagnosis not present

## 2013-11-26 DIAGNOSIS — M79609 Pain in unspecified limb: Secondary | ICD-10-CM | POA: Diagnosis not present

## 2013-11-26 NOTE — Progress Notes (Signed)
She presents today with a chief complaint of painful elongated toenails one through 5 bilateral.  Objective: Thick yellow dystrophic onychomycotic painful palpation.  Assessment: Pain in limb secondary to onychomycosis 1 through 5 bilateral.  Plan: Debridement of nails 1 through 5 bilateral covered service secondary to pain.

## 2013-12-04 DIAGNOSIS — M5126 Other intervertebral disc displacement, lumbar region: Secondary | ICD-10-CM | POA: Diagnosis not present

## 2013-12-04 DIAGNOSIS — IMO0002 Reserved for concepts with insufficient information to code with codable children: Secondary | ICD-10-CM | POA: Diagnosis not present

## 2013-12-17 ENCOUNTER — Encounter: Payer: Self-pay | Admitting: Vascular Surgery

## 2013-12-18 ENCOUNTER — Ambulatory Visit (INDEPENDENT_AMBULATORY_CARE_PROVIDER_SITE_OTHER): Payer: Medicare Other | Admitting: Vascular Surgery

## 2013-12-18 ENCOUNTER — Ambulatory Visit (HOSPITAL_COMMUNITY)
Admission: RE | Admit: 2013-12-18 | Discharge: 2013-12-18 | Disposition: A | Discharge status: Home / Self Care | Payer: Medicare Other | Source: Ambulatory Visit | Attending: Vascular Surgery | Admitting: Vascular Surgery

## 2013-12-18 ENCOUNTER — Encounter: Payer: Self-pay | Admitting: Vascular Surgery

## 2013-12-18 VITALS — BP 152/79 | HR 65 | Ht 61.0 in | Wt 177.5 lb

## 2013-12-18 DIAGNOSIS — I714 Abdominal aortic aneurysm, without rupture, unspecified: Secondary | ICD-10-CM | POA: Insufficient documentation

## 2013-12-18 NOTE — Assessment & Plan Note (Signed)
This patient has a small 3.4 cm infrarenal abdominal aortic aneurysm. I've explained that we would normally not consider repair unless it reached 5.5 cm in maximum diameter and a normal risk patient. I have recommended follow up ultrasound in one year and I will see her back at that time. I've explained that the 2 things at increased risk of aneurysm expansion his smoking and poorly controlled blood pressure. She is not a smoker, and her blood pressure is well controlled by her primary care physician Dr. Dennard Schaumann. Will see her back in one year. She knows to call sooner if she has problems.

## 2013-12-18 NOTE — Addendum Note (Signed)
Addended by: Mena Goes on: 12/18/2013 10:46 AM   Modules accepted: Orders

## 2013-12-18 NOTE — Progress Notes (Signed)
Patient ID: [name], female   DOB: 10-23-1933, 80 y.o.   MRN: [id number]  Reason for Consult: [name]   Referred by Meredith Pel, MD  Subjective:     HPI:  [name] is a 80 y.o. female who was being worked up for back pain. This prompted an MRI which does show significant degenerative disc disease of her back. An incidental finding was a small abdominal aortic aneurysm. She was sent for vascular evaluation. She continues to have some back pain. She was having pain radiating down her right leg but this has improved after a recent injection. There is no family history of aneurysmal disease that she is aware of. She denies significant abdominal pain.  She is not a smoker. Her blood pressure is well controlled by Dr. Dennard Schaumann.  Past Medical History  Diagnosis Date  . Hypertension   . Rheumatoid arthritis(714.0)   . S/P carpal tunnel release   . Diabetes mellitus     pre-diabetes  . Diverticulosis   . Osteoarthritis   . Nephrolithiasis   . Osteoporosis   . Hyperlipidemia   . Dementia   . OSA on CPAP   . [name] (abdominal aortic aneurysm) 09/2013    3.6 cm  . Spinal stenosis    Family History  Problem Relation Age of Onset  . Stroke Father   . Coronary artery disease Son     CABG at age 34  . Heart disease Son   . Heart attack Son   . Stroke Mother   . Hypertension Sister   . Diabetes Brother   . Hypertension Maternal Aunt    Past Surgical History  Procedure Laterality Date  . Total knee arthroplasty  08/21/2007    Right total knee replacement  . Carpal tunnel release  12/27/2006    Right subcutaneous ulnar nerve transfer -- Right open carpal tunnel release  . Foot surgery    . Cholecystectomy    . Appendectomy    . Joint replacement      BTKR, RTHR   Short Social History:  History  Substance Use Topics  . Smoking status: Never Smoker   . Smokeless tobacco: Never Used  . Alcohol Use: No   Allergies  Allergen Reactions  . Sulfonamide Derivatives      Current Outpatient Prescriptions  Medication Sig Dispense Refill  . aspirin EC 81 MG tablet Take 1 tablet (81 mg total) by mouth daily.  150 tablet  2  . atenolol (TENORMIN) 25 MG tablet Take 1 tablet (25 mg total) by mouth daily.  90 tablet  1  . Calcium-Vitamin D 600-200 MG-UNIT per tablet Take 1 tablet by mouth 3 (three) times daily with meals.      . gabapentin (NEURONTIN) 800 MG tablet TAKE 1 TABLET BY MOUTH EVERY MORNING, 1 TABLET AT LUNCH AND 2 TABLETS EVERY NIGHT AT BEDTIME  360 tablet  0  . hydrochlorothiazide (HYDRODIURIL) 25 MG tablet TAKE 1 TABLET BY MOUTH EVERY DAY  90 tablet  3  . meloxicam (MOBIC) 7.5 MG tablet       . Omega-3 Fatty Acids (FISH OIL) 1000 MG CAPS Take by mouth. 1 tab twice a day      . oxymorphone (OPANA) 5 MG tablet 5 mg.       . simvastatin (ZOCOR) 20 MG tablet Take 20 mg by mouth at bedtime.       No current facility-administered medications for this visit.   Review of Systems  Constitutional:  Negative for chills and fever.  Eyes: Negative for loss of vision.  Respiratory: Negative for cough and wheezing.  Cardiovascular: Positive for claudication and leg swelling. Negative for chest pain, chest tightness, dyspnea with exertion, orthopnea and palpitations.  GI: Negative for blood in stool and vomiting.  GU: Negative for dysuria and hematuria.  Musculoskeletal: Negative for leg pain, joint pain and myalgias.  Skin: Negative for rash and wound.  Neurological: Negative for dizziness and speech difficulty.  Hematologic: Negative for bruises/bleeds easily. Psychiatric: Negative for depressed mood.       Objective:  Objective  Filed Vitals:   12/18/13 0950  BP: 152/79  Pulse: 65  Height: 5\' 1"  (1.549 m)  Weight: 177 lb 8 oz (80.513 kg)  SpO2: 95%   Body mass index is 33.56 kg/(m^2).  Physical Exam  Constitutional: She is oriented to person, place, and time. She appears well-developed and well-nourished.  HENT:  Head: Normocephalic and  atraumatic.  Neck: Neck supple. No JVD present. No thyromegaly present.  Cardiovascular: Normal rate, regular rhythm and normal heart sounds.  Exam reveals no friction rub.   No murmur heard. Pulses:      Femoral pulses are 2+ on the right side, and 2+ on the left side.      Popliteal pulses are 2+ on the right side, and 2+ on the left side.       Dorsalis pedis pulses are 2+ on the right side, and 2+ on the left side.       Posterior tibial pulses are 2+ on the right side, and 2+ on the left side.  Pulmonary/Chest: Breath sounds normal. She has no wheezes. She has no rales.  Abdominal: Soft. Bowel sounds are normal. There is no tenderness.  I do not palpate an aneurysm, although it is difficult to assess because of her body habitus.  Musculoskeletal: Normal range of motion. She exhibits no edema.  She's had a previous right second and third toe amputation.  Lymphadenopathy:    She has no cervical adenopathy.  Neurological: She is alert and oriented to person, place, and time. She has normal strength. No sensory deficit.  Skin: No lesion and no rash noted.  Psychiatric: She has a normal mood and affect.   Data: I have reviewed her records from [facility]. She does have significant degenerative disc disease of her back. Her MRI showed severe stenosis at L4-L5.  I have independently interpreted her ultrasound today which shows that the maximum diameter of her infrarenal aorta is 3.4 cm. The right common iliac artery measures 1.48 cm in maximum diameter. The left common iliac artery measures 1.4 cm in maximum diameter.      Assessment/Plan:    [name] (abdominal aortic aneurysm) This patient has a small 3.4 cm infrarenal abdominal aortic aneurysm. I've explained that we would normally not consider repair unless it reached 5.5 cm in maximum diameter and a normal risk patient. I have recommended follow up ultrasound in one year and I will see her back at that time. I've explained that  the 2 things at increased risk of aneurysm expansion his smoking and poorly controlled blood pressure. She is not a smoker, and her blood pressure is well controlled by her primary care physician Dr. Dennard Schaumann. Will see her back in one year. She knows to call sooner if she has problems.   Angelia Mould MD [facility]

## 2014-01-31 ENCOUNTER — Other Ambulatory Visit: Payer: Self-pay | Admitting: Podiatry

## 2014-02-11 DIAGNOSIS — F1120 Opioid dependence, uncomplicated: Secondary | ICD-10-CM | POA: Insufficient documentation

## 2014-02-11 DIAGNOSIS — G629 Polyneuropathy, unspecified: Secondary | ICD-10-CM | POA: Insufficient documentation

## 2014-02-11 DIAGNOSIS — M4716 Other spondylosis with myelopathy, lumbar region: Secondary | ICD-10-CM | POA: Insufficient documentation

## 2014-02-27 ENCOUNTER — Ambulatory Visit (INDEPENDENT_AMBULATORY_CARE_PROVIDER_SITE_OTHER): Payer: Medicare Other | Admitting: Podiatry

## 2014-02-27 ENCOUNTER — Encounter: Payer: Self-pay | Admitting: Podiatry

## 2014-02-27 ENCOUNTER — Ambulatory Visit: Payer: Medicare Other | Admitting: Podiatry

## 2014-02-27 DIAGNOSIS — M79676 Pain in unspecified toe(s): Secondary | ICD-10-CM | POA: Diagnosis not present

## 2014-02-27 DIAGNOSIS — B351 Tinea unguium: Secondary | ICD-10-CM

## 2014-02-27 NOTE — Progress Notes (Signed)
Presents today chief complaint of painful elongated toenails.  Objective: Pulses are palpable bilateral nails are thick, yellow dystrophic onychomycosis and painful palpation.   Assessment: Onychomycosis with pain in limb.  Plan: Treatment of nails in thickness and length as covered service secondary to pain.  

## 2014-03-05 DIAGNOSIS — M47817 Spondylosis without myelopathy or radiculopathy, lumbosacral region: Secondary | ICD-10-CM | POA: Diagnosis not present

## 2014-03-05 DIAGNOSIS — M47816 Spondylosis without myelopathy or radiculopathy, lumbar region: Secondary | ICD-10-CM | POA: Diagnosis not present

## 2014-03-29 ENCOUNTER — Other Ambulatory Visit: Payer: Self-pay | Admitting: Family Medicine

## 2014-04-30 ENCOUNTER — Other Ambulatory Visit: Payer: Self-pay | Admitting: Podiatry

## 2014-05-14 DIAGNOSIS — M47816 Spondylosis without myelopathy or radiculopathy, lumbar region: Secondary | ICD-10-CM | POA: Diagnosis not present

## 2014-05-29 ENCOUNTER — Ambulatory Visit (INDEPENDENT_AMBULATORY_CARE_PROVIDER_SITE_OTHER): Payer: Medicare Other | Admitting: Podiatry

## 2014-05-29 DIAGNOSIS — B351 Tinea unguium: Secondary | ICD-10-CM

## 2014-05-29 DIAGNOSIS — M79676 Pain in unspecified toe(s): Secondary | ICD-10-CM | POA: Diagnosis not present

## 2014-05-29 NOTE — Progress Notes (Signed)
Subjective:     Patient ID: [name], female   DOB: 1933-07-23, 81 y.o.   MRN: [id number]  HPI patient presents with painful 3 nails on the right foot and 5 nails on the left foot that are thick incurvated and she cannot take care of herself   Review of Systems     Objective:   Physical Exam Neurovascular status unchanged with thick brittle nailbeds [phone] on the right and 1 through 5 left that are thick and painful when pressed    Assessment:     Mycotic nail infection of 3 nails right foot 5 nails left that her symptomatically and painful    Plan:     Debride 8 nails with no iatrogenic bleeding noted

## 2014-05-29 NOTE — Progress Notes (Signed)
Presents today chief complaint of painful elongated toenails.  Objective: Pulses are palpable bilateral nails are thick, yellow dystrophic onychomycosis and painful palpation.   Assessment: Onychomycosis with pain in limb.  Plan: Treatment of nails in thickness and length as covered service secondary to pain.  

## 2014-06-09 NOTE — Progress Notes (Signed)
HPI: FU abnormal functional study. I intitially saw in January of 2013 for an abnormal electrocardiogram. She had documented dynamic anterior T-wave changes. Myoview was performed in January 2013 and showed mild reversible defect at the apex. This was felt to possibly represent ischemia. Ejection fraction was 77%. I reviewed and felt low risk. Treated medically. Small [name] followed by vascular surgery. Since I last saw her, the patient denies any dyspnea on exertion, orthopnea, PND, pedal edema, palpitations, syncope or chest pain.   Current Outpatient Prescriptions  Medication Sig Dispense Refill  . aspirin EC 81 MG tablet Take 1 tablet (81 mg total) by mouth daily. 150 tablet 2  . atenolol (TENORMIN) 25 MG tablet TAKE 1 TABLET BY MOUTH EVERY DAY 90 tablet 1  . Calcium-Vitamin D 600-200 MG-UNIT per tablet Take 1 tablet by mouth 3 (three) times daily with meals.    . gabapentin (NEURONTIN) 800 MG tablet TAKE 1 TABLET BY MOUTH EVERY MORNING, 1 TABLET AT LUNCH, AND 2 TABLETS EVERY NIGHT AT BEDTIME 360 tablet 0  . hydrochlorothiazide (HYDRODIURIL) 25 MG tablet TAKE 1 TABLET BY MOUTH EVERY DAY 90 tablet 3  . meloxicam (MOBIC) 7.5 MG tablet     . Omega-3 Fatty Acids (FISH OIL) 1000 MG CAPS Take by mouth. 1 tab twice a day    . oxymorphone (OPANA) 10 MG tablet Take 1 tablet by mouth 3 (three) times daily.  0  . simvastatin (ZOCOR) 20 MG tablet Take 20 mg by mouth at bedtime.     No current facility-administered medications for this visit.     Past Medical History  Diagnosis Date  . Hypertension   . Rheumatoid arthritis(714.0)   . S/P carpal tunnel release   . Diabetes mellitus     pre-diabetes  . Diverticulosis   . Osteoarthritis   . Nephrolithiasis   . Osteoporosis   . Hyperlipidemia   . Dementia   . OSA on CPAP   . [name] (abdominal aortic aneurysm) 09/2013    3.6 cm  . Spinal stenosis     Past Surgical History  Procedure Laterality Date  . Total knee arthroplasty  08/21/2007     Right total knee replacement  . Carpal tunnel release  12/27/2006    Right subcutaneous ulnar nerve transfer -- Right open carpal tunnel release  . Foot surgery    . Cholecystectomy    . Appendectomy    . Joint replacement      BTKR, RTHR    History   Social History  . Marital Status: Divorced    Spouse Name: N/A    Number of Children: 6  . Years of Education: N/A   Occupational History  . Not on file.   Social History Main Topics  . Smoking status: Never Smoker   . Smokeless tobacco: Never Used  . Alcohol Use: No  . Drug Use: No  . Sexual Activity: Not on file   Other Topics Concern  . Not on file   Social History Narrative    ROS: arthralgias but no fevers or chills, productive cough, hemoptysis, dysphasia, odynophagia, melena, hematochezia, dysuria, hematuria, rash, seizure activity, orthopnea, PND, pedal edema, claudication. Remaining systems are negative.  Physical Exam: Well-developed well-nourished in no acute distress.  Skin is warm and dry.  HEENT is normal.  Neck is supple.  Chest is clear to auscultation with normal expansion.  Cardiovascular exam is regular rate and rhythm.  Abdominal exam nontender or distended. No masses palpated. Extremities  show no edema. neuro grossly intact  ECG sinus rhythm at a rate of 69. No ST changes.

## 2014-06-10 ENCOUNTER — Encounter: Payer: Self-pay | Admitting: Cardiology

## 2014-06-10 ENCOUNTER — Ambulatory Visit (INDEPENDENT_AMBULATORY_CARE_PROVIDER_SITE_OTHER): Payer: Medicare Other | Admitting: Cardiology

## 2014-06-10 VITALS — BP 104/70 | HR 69 | Ht 64.0 in | Wt 181.9 lb

## 2014-06-10 DIAGNOSIS — I10 Essential (primary) hypertension: Secondary | ICD-10-CM | POA: Diagnosis not present

## 2014-06-10 DIAGNOSIS — R9430 Abnormal result of cardiovascular function study, unspecified: Secondary | ICD-10-CM

## 2014-06-10 DIAGNOSIS — I714 Abdominal aortic aneurysm, without rupture, unspecified: Secondary | ICD-10-CM

## 2014-06-10 DIAGNOSIS — R9431 Abnormal electrocardiogram [ECG] [EKG]: Secondary | ICD-10-CM | POA: Diagnosis not present

## 2014-06-10 NOTE — Assessment & Plan Note (Signed)
Follow-up vascular surgery.

## 2014-06-10 NOTE — Assessment & Plan Note (Signed)
Blood pressure controlled. Continue present medications. Potassium and renal function monitored by primary care. 

## 2014-06-10 NOTE — Assessment & Plan Note (Signed)
Continue statin. Lipids and liver monitored by primary care. 

## 2014-06-10 NOTE — Assessment & Plan Note (Signed)
I reviewed the patient's nuclear study with she and her son previously. She is not having significant symptoms. I think her Myoview is low risk. It most likely represents shifting breast attenuation but mild distal anterior/apical ischemia cannot be excluded. Given that it is low risk and she is having no chest pain; plan medical therapy. Continue ASA and statin. If she develops symptoms in the future we could consider pursuing cardiac catheterization. Otherwise we can perform followup functional studies in the future.

## 2014-06-10 NOTE — Patient Instructions (Signed)
Your physician wants you to follow-up in: ONE YEAR WITH DR CRENSHAW You will receive a reminder letter in the mail two months in advance. If you don't receive a letter, please call our office to schedule the follow-up appointment.  

## 2014-06-19 ENCOUNTER — Encounter: Payer: Self-pay | Admitting: Podiatry

## 2014-06-19 ENCOUNTER — Ambulatory Visit (INDEPENDENT_AMBULATORY_CARE_PROVIDER_SITE_OTHER): Payer: Medicare Other | Admitting: Podiatry

## 2014-06-19 VITALS — BP 130/82 | HR 67 | Resp 16

## 2014-06-19 DIAGNOSIS — M722 Plantar fascial fibromatosis: Secondary | ICD-10-CM | POA: Diagnosis not present

## 2014-06-19 NOTE — Progress Notes (Signed)
She presents today with chief complaint of bilateral heel pain and lateral foot pain. She denies any trauma states that as the heels get worse the outside of the feet certain her worse to.  Objective: Vital signs are stable she is alert and oriented 3 pulses are palpable bilateral. She has pain on palpation medial calcaneal tubercles bilateral.  Assessment: History of plantar fasciitis bilateral. Lateral compensatory syndrome bilateral.  Plan: Discussed etiology pathology conservative versus surgical therapies. At this point she does not want oral medication rather an injection. We injected the bilateral heels today with Kenalog and anesthetic. I will follow up with her at her regular scheduled appointment for her nail debridement.

## 2014-07-25 ENCOUNTER — Other Ambulatory Visit: Payer: Self-pay | Admitting: Podiatry

## 2014-08-20 DIAGNOSIS — M47816 Spondylosis without myelopathy or radiculopathy, lumbar region: Secondary | ICD-10-CM | POA: Diagnosis not present

## 2014-08-23 DIAGNOSIS — Z23 Encounter for immunization: Secondary | ICD-10-CM | POA: Diagnosis not present

## 2014-09-04 ENCOUNTER — Encounter: Payer: Self-pay | Admitting: Podiatry

## 2014-09-04 ENCOUNTER — Ambulatory Visit (INDEPENDENT_AMBULATORY_CARE_PROVIDER_SITE_OTHER): Payer: Medicare Other | Admitting: Podiatry

## 2014-09-04 DIAGNOSIS — B351 Tinea unguium: Secondary | ICD-10-CM

## 2014-09-04 DIAGNOSIS — E1142 Type 2 diabetes mellitus with diabetic polyneuropathy: Secondary | ICD-10-CM

## 2014-09-04 DIAGNOSIS — M79676 Pain in unspecified toe(s): Secondary | ICD-10-CM

## 2014-09-04 NOTE — Progress Notes (Signed)
She presents today with chief complaint of painful elongated toenails bilateral. She is also having pain to the plantar aspect of the bilateral foot as she refers to her poor keratomas.  Objective: Vital signs are stable she is alert and oriented 3. Pulses are palpable bilateral. Neurologic sensorium is decreased. She has pain on range of motion of the midfoot bilateral. She also has pain on palpation of the toes bilateral. Her toenails are thick yellow dystrophic onychomycotic bilaterally. Multiple porokeratotic lesions plantar aspect bilateral foot bilateral.  Assessment: Diabetic peripheral neuropathy with a history of peripheral vascular disease hammertoe deformity and osteoarthritis. Porokeratosis bilateral. Pain in limb secondary to onychomycosis bilateral.  Plan: Debridement of nails and porokeratotic lesions bilateral.

## 2014-09-25 ENCOUNTER — Ambulatory Visit (INDEPENDENT_AMBULATORY_CARE_PROVIDER_SITE_OTHER): Payer: Medicare Other | Admitting: Family Medicine

## 2014-09-25 ENCOUNTER — Telehealth: Payer: Self-pay | Admitting: *Deleted

## 2014-09-25 ENCOUNTER — Encounter: Payer: Self-pay | Admitting: Family Medicine

## 2014-09-25 VITALS — BP 124/80 | HR 76 | Temp 98.4°F | Resp 16

## 2014-09-25 DIAGNOSIS — D485 Neoplasm of uncertain behavior of skin: Secondary | ICD-10-CM | POA: Diagnosis not present

## 2014-09-25 DIAGNOSIS — G250 Essential tremor: Secondary | ICD-10-CM | POA: Diagnosis not present

## 2014-09-25 DIAGNOSIS — E785 Hyperlipidemia, unspecified: Secondary | ICD-10-CM

## 2014-09-25 LAB — CBC WITH DIFFERENTIAL/PLATELET
Basophils Absolute: 0.1 10*3/uL (ref 0.0–0.1)
Basophils Relative: 1 % (ref 0–1)
Eosinophils Absolute: 0.3 10*3/uL (ref 0.0–0.7)
Eosinophils Relative: 3 % (ref 0–5)
HCT: 41.7 % (ref 36.0–46.0)
Hemoglobin: 14 g/dL (ref 12.0–15.0)
Lymphocytes Relative: 28 % (ref 12–46)
Lymphs Abs: 2.4 10*3/uL (ref 0.7–4.0)
MCH: 29.8 pg (ref 26.0–34.0)
MCHC: 33.6 g/dL (ref 30.0–36.0)
MCV: 88.7 fL (ref 78.0–100.0)
MPV: 10.3 fL (ref 8.6–12.4)
Monocytes Absolute: 0.6 10*3/uL (ref 0.1–1.0)
Monocytes Relative: 7 % (ref 3–12)
Neutro Abs: 5.1 10*3/uL (ref 1.7–7.7)
Neutrophils Relative %: 61 % (ref 43–77)
Platelets: 183 10*3/uL (ref 150–400)
RBC: 4.7 MIL/uL (ref 3.87–5.11)
RDW: 13.8 % (ref 11.5–15.5)
WBC: 8.4 10*3/uL (ref 4.0–10.5)

## 2014-09-25 LAB — COMPLETE METABOLIC PANEL WITH GFR
ALT: 27 U/L (ref 0–35)
AST: 36 U/L (ref 0–37)
Albumin: 4.1 g/dL (ref 3.5–5.2)
Alkaline Phosphatase: 43 U/L (ref 39–117)
BUN: 17 mg/dL (ref 6–23)
CO2: 29 mEq/L (ref 19–32)
Calcium: 9.8 mg/dL (ref 8.4–10.5)
Chloride: 98 mEq/L (ref 96–112)
Creat: 0.75 mg/dL (ref 0.50–1.10)
GFR, Est African American: 88 mL/min
GFR, Est Non African American: 77 mL/min
Glucose, Bld: 107 mg/dL — ABNORMAL HIGH (ref 70–99)
Potassium: 4.5 mEq/L (ref 3.5–5.3)
Sodium: 140 mEq/L (ref 135–145)
Total Bilirubin: 0.7 mg/dL (ref 0.2–1.2)
Total Protein: 7.2 g/dL (ref 6.0–8.3)

## 2014-09-25 LAB — LIPID PANEL
Cholesterol: 216 mg/dL — ABNORMAL HIGH (ref 0–200)
HDL: 40 mg/dL — ABNORMAL LOW (ref >=46)
LDL Cholesterol: 144 mg/dL — ABNORMAL HIGH (ref 0–99)
Total CHOL/HDL Ratio: 5.4 Ratio
Triglycerides: 161 mg/dL — ABNORMAL HIGH (ref <150)
VLDL: 32 mg/dL (ref 0–40)

## 2014-09-25 LAB — TSH: TSH: 2.598 u[IU]/mL (ref 0.350–4.500)

## 2014-09-25 MED ORDER — HYDROCHLOROTHIAZIDE 25 MG PO TABS: 25.0000 mg | ORAL_TABLET | Freq: Every day | ORAL | Status: DC

## 2014-09-25 MED ORDER — ATENOLOL 25 MG PO TABS: 25.0000 mg | ORAL_TABLET | Freq: Every day | ORAL | Status: DC

## 2014-09-25 MED ORDER — SIMVASTATIN 20 MG PO TABS: 20.0000 mg | ORAL_TABLET | Freq: Every day | ORAL | Status: DC

## 2014-09-25 NOTE — Progress Notes (Signed)
Subjective:    Patient ID: [name], female    DOB: 1934/04/04, 81 y.o.   MRN: [id number]  HPI  6 weeks ago, the patient's pain doctor decreased her dose of Opana in an attempt to wean her down off of that medication. He also started her on Cymbalta for chronic pain. Over the last week or so, the patient has developed a worsening tremor in both hands. The tremor is present at rest. It is worse with attention and activity. She also has some mild shaking in both legs. She also has some very mild myoclonic jerks in both legs. She denies any falls or dizziness. She denies any head trauma. There are no strokelike symptoms. Cranial nerves II through XII are grossly intact muscle strength is 5 over 5 equal and symmetric in all extremities. Coordination is normal. Cerebellar exam is normal although her tremor worsens with finger to nose testing. She is overdue for fasting lab work to monitor her cholesterol. Of note there is also a 3 mm erythematous papule with a central ulceration underneath her left eye appears to be a basal cell cancer versus squamous cell cancer Past Medical History  Diagnosis Date  . Hypertension   . Rheumatoid arthritis(714.0)   . S/P carpal tunnel release   . Diabetes mellitus     pre-diabetes  . Diverticulosis   . Osteoarthritis   . Nephrolithiasis   . Osteoporosis   . Hyperlipidemia   . Dementia   . OSA on CPAP   . [name] (abdominal aortic aneurysm) 09/2013    3.6 cm  . Spinal stenosis    Past Surgical History  Procedure Laterality Date  . Total knee arthroplasty  08/21/2007    Right total knee replacement  . Carpal tunnel release  12/27/2006    Right subcutaneous ulnar nerve transfer -- Right open carpal tunnel release  . Foot surgery    . Cholecystectomy    . Appendectomy    . Joint replacement      BTKR, RTHR   Current Outpatient Prescriptions on File Prior to Visit  Medication Sig Dispense Refill  . aspirin EC 81 MG tablet Take 1 tablet (81 mg total) by  mouth daily. 150 tablet 2  . Calcium-Vitamin D 600-200 MG-UNIT per tablet Take 1 tablet by mouth 3 (three) times daily with meals.    . DULoxetine (CYMBALTA) 30 MG capsule 30 mg 2 (two) times daily. Twice daily  2  . gabapentin (NEURONTIN) 800 MG tablet TAKE 1 TABLET BY MOUTH EVERY MORNING, 1 TABLET AT LUNCH, AND 2 TABLETS EVERY NIGHT AT BEDTIME 360 tablet 0  . Omega-3 Fatty Acids (FISH OIL) 1000 MG CAPS Take by mouth. 1 tab twice a day    . oxymorphone (OPANA) 5 MG tablet Take 5 mg by mouth 3 (three) times daily.     No current facility-administered medications on file prior to visit.   Allergies  Allergen Reactions  . Sulfonamide Derivatives    History   Social History  . Marital Status: Divorced    Spouse Name: N/A  . Number of Children: 6  . Years of Education: N/A   Occupational History  . Not on file.   Social History Main Topics  . Smoking status: Never Smoker   . Smokeless tobacco: Never Used  . Alcohol Use: No  . Drug Use: No  . Sexual Activity: Not on file   Other Topics Concern  . Not on file   Social History Narrative  Review of Systems  All other systems reviewed and are negative.      Objective:   Physical Exam  Constitutional: She is oriented to person, place, and time. She appears well-developed and well-nourished. No distress.  HENT:  Head: Normocephalic and atraumatic.  Right Ear: External ear normal.  Left Ear: External ear normal.  Nose: Nose normal.  Mouth/Throat: Oropharynx is clear and moist. No oropharyngeal exudate.  Eyes: Pupils are equal, round, and reactive to light.  Neck: Neck supple. No thyromegaly present.  Cardiovascular: Normal rate, regular rhythm and normal heart sounds.   No murmur heard. Pulmonary/Chest: Effort normal. She has wheezes.  Abdominal: Soft. Bowel sounds are normal.  Lymphadenopathy:    She has no cervical adenopathy.  Neurological: She is alert and oriented to person, place, and time. She displays  tremor. She displays normal reflexes. No cranial nerve deficit. She exhibits normal muscle tone. Coordination normal.  Skin: She is not diaphoretic.  Vitals reviewed.         Assessment & Plan:  [name] (hyperlipidemia) - Plan: CBC with Differential/Platelet, COMPLETE METABOLIC PANEL WITH GFR, Lipid panel  Essential tremor - Plan: TSH  Neoplasm of uncertain behavior of skin - Plan: Ambulatory referral to Dermatology  At the present time, I think the patient has an essential tremor that is made worse by her withdrawal from opiates. I will check a TSH. I recommended we monitor this. There are no signs or symptoms of Parkinson's disease. Certainly if the patient develops muscular weakness, worsening coordination, falls, etc., we could refer the patient to a neurologist for further evaluation for possible motor neuron disease however at the present time I believe this is an essential tremor. Given her wheezing however I would not increase her beta blocker further. I would simply like to give this a chance to improve as she adjusts to the lower dose of narcotics I will check a fasting lipid panel while she is here today to monitor her cholesterol. I will also refer the patient to a dermatologist as I believe she requires surgical excision of cancerous lesion below her left eye.

## 2014-09-25 NOTE — Telephone Encounter (Signed)
pt has appt scheduled with Dr. Harriett Sine at [facility] on June 6th at 10:10am, left message on vm to return my call

## 2014-09-26 NOTE — Telephone Encounter (Signed)
Pt son Audelia Acton is aware of appt

## 2014-09-30 ENCOUNTER — Other Ambulatory Visit: Payer: Self-pay | Admitting: Family Medicine

## 2014-09-30 MED ORDER — SIMVASTATIN 40 MG PO TABS: 40.0000 mg | ORAL_TABLET | Freq: Every day | ORAL | Status: DC

## 2014-10-06 DIAGNOSIS — D2239 Melanocytic nevi of other parts of face: Secondary | ICD-10-CM | POA: Diagnosis not present

## 2014-10-06 DIAGNOSIS — C44319 Basal cell carcinoma of skin of other parts of face: Secondary | ICD-10-CM | POA: Diagnosis not present

## 2014-10-15 DIAGNOSIS — C44119 Basal cell carcinoma of skin of left eyelid, including canthus: Secondary | ICD-10-CM | POA: Diagnosis not present

## 2014-10-15 DIAGNOSIS — Z85828 Personal history of other malignant neoplasm of skin: Secondary | ICD-10-CM | POA: Diagnosis not present

## 2014-10-20 ENCOUNTER — Other Ambulatory Visit: Payer: Self-pay | Admitting: Podiatry

## 2014-11-24 ENCOUNTER — Encounter: Payer: Self-pay | Admitting: Physician Assistant

## 2014-11-24 ENCOUNTER — Ambulatory Visit (INDEPENDENT_AMBULATORY_CARE_PROVIDER_SITE_OTHER): Payer: Medicare Other | Admitting: Physician Assistant

## 2014-11-24 ENCOUNTER — Telehealth: Payer: Self-pay | Admitting: Family Medicine

## 2014-11-24 VITALS — BP 138/90 | HR 68 | Temp 98.5°F | Resp 18 | Wt 183.0 lb

## 2014-11-24 DIAGNOSIS — B9689 Other specified bacterial agents as the cause of diseases classified elsewhere: Secondary | ICD-10-CM

## 2014-11-24 DIAGNOSIS — J988 Other specified respiratory disorders: Secondary | ICD-10-CM

## 2014-11-24 DIAGNOSIS — J020 Streptococcal pharyngitis: Secondary | ICD-10-CM

## 2014-11-24 DIAGNOSIS — J029 Acute pharyngitis, unspecified: Secondary | ICD-10-CM

## 2014-11-24 LAB — RAPID STREP SCREEN (MED CTR MEBANE ONLY): Streptococcus, Group A Screen (Direct): POSITIVE — AB

## 2014-11-24 MED ORDER — AZITHROMYCIN 250 MG PO TABS: ORAL_TABLET | ORAL | Status: DC

## 2014-11-24 NOTE — Telephone Encounter (Signed)
Per provider Azithromycin is one tablet BID x 5 days.  Pharmacy called and order clarified.

## 2014-11-24 NOTE — Telephone Encounter (Signed)
[facility] calling to clarify some quantities that were called in to them  [phone] erin

## 2014-11-24 NOTE — Progress Notes (Signed)
Patient ID: [name] MRN: [id number], DOB: 03-20-1934, 81 y.o. Date of Encounter: 11/24/2014, 8:57 AM    Chief Complaint:  Chief Complaint  Patient presents with  . Cough    X 1 week  . Sore Throat    X 1 week, still little sore but not as bad it was     HPI: 81 y.o. year old white female ear with her son. Reports the above symptoms. Says that the sore throat is not nearly as sore as it had been to beginning. Says that she also has had some mucus from the nose. Whenever mostly noticing chest congestion and cough at this point. No known fevers or chills.     Home Meds:   Outpatient Prescriptions Prior to Visit  Medication Sig Dispense Refill  . aspirin EC 81 MG tablet Take 1 tablet (81 mg total) by mouth daily. 150 tablet 2  . atenolol (TENORMIN) 25 MG tablet Take 1 tablet (25 mg total) by mouth daily. 90 tablet 3  . Calcium-Vitamin D 600-200 MG-UNIT per tablet Take 1 tablet by mouth 3 (three) times daily with meals.    . DULoxetine (CYMBALTA) 30 MG capsule 30 mg 2 (two) times daily. Twice daily  2  . gabapentin (NEURONTIN) 800 MG tablet TAKE 1 TABLET BY MOUTH EVERY MORNING AND AT LUNCH AND 2 TABLETS EVERY NIGHT AT BEDTIME 360 tablet 0  . hydrochlorothiazide (HYDRODIURIL) 25 MG tablet Take 1 tablet (25 mg total) by mouth daily. 90 tablet 3  . Omega-3 Fatty Acids (FISH OIL) 1000 MG CAPS Take by mouth. 1 tab twice a day    . oxymorphone (OPANA) 5 MG tablet Take 5 mg by mouth 3 (three) times daily.    . simvastatin (ZOCOR) 40 MG tablet Take 1 tablet (40 mg total) by mouth at bedtime. 90 tablet 3   No facility-administered medications prior to visit.    Allergies:  Allergies  Allergen Reactions  . Sulfonamide Derivatives       Review of Systems: See HPI for pertinent ROS. All other ROS negative.    Physical Exam: Blood pressure 138/90, pulse 68, temperature 98.5 F (36.9 C), temperature source Oral, resp. rate 18, weight 183 lb (83.008 kg)., Body mass index is 31.4  kg/(m^2). General:  WNWD WF. Appears in no acute distress. HEENT: Normocephalic, atraumatic, eyes without discharge, sclera non-icteric, nares are without discharge. Bilateral auditory canals clear. Left TM with areas of white scar.  Oral cavity moist, posterior pharynx with mild erythema. No exudate,  peritonsillar abscess.  No tenderness with percussion of frontal or maxillary sinuses bilaterally.  Neck: Supple. No thyromegaly. No lymphadenopathy. Lungs: Clear bilaterally to auscultation without wheezes, rales, or rhonchi. Breathing is unlabored. Heart: Regular rhythm. No murmurs, rubs, or gallops. Msk:  Strength and tone normal for age. Extremities/Skin: Warm and dry.  Neuro: Alert and oriented X 3. Moves all extremities spontaneously. Gait is normal. CNII-XII grossly in tact. Psych:  Responds to questions appropriately with a normal affect.   Rapid strep test came back positive. Therefore will dose azithromycin to treat strep as well as respiratory infection. Told them to also use Mucinex DM as expectorant.   Follow-up if symptoms do not resolve within 1 week after completion of antibiotic.   ASSESSMENT AND PLAN:  81 y.o. year old female with  1. Bacterial respiratory infection - azithromycin (ZITHROMAX) 250 MG tablet; Take 2 daily for 5 days.  Dispense: 10 tablet; Refill: 0  2. Strep pharyngitis - azithromycin (  ZITHROMAX) 250 MG tablet; Take 2 daily for 5 days.  Dispense: 10 tablet; Refill: 0  3. Sore throat - Rapid Strep Screen   Signed, Olean Ree Wiseman, [place], [facility] 11/24/2014 8:57 AM

## 2014-12-04 ENCOUNTER — Ambulatory Visit (INDEPENDENT_AMBULATORY_CARE_PROVIDER_SITE_OTHER): Payer: Medicare Other | Admitting: Podiatry

## 2014-12-04 ENCOUNTER — Encounter: Payer: Self-pay | Admitting: Podiatry

## 2014-12-04 DIAGNOSIS — M722 Plantar fascial fibromatosis: Secondary | ICD-10-CM

## 2014-12-04 DIAGNOSIS — B351 Tinea unguium: Secondary | ICD-10-CM | POA: Diagnosis not present

## 2014-12-04 DIAGNOSIS — Q828 Other specified congenital malformations of skin: Secondary | ICD-10-CM

## 2014-12-04 DIAGNOSIS — M79676 Pain in unspecified toe(s): Secondary | ICD-10-CM | POA: Diagnosis not present

## 2014-12-04 NOTE — Progress Notes (Signed)
She presents today for painful elongated toenails bilateral and painful porokeratotic lesions. She also states she is having some increased pain in both feet as well.  Objective: Vital signs are stable she is alert and oriented 3. Pulses are palpable bilateral. Neurologic sensorium is decreased. She has pain on range of motion of the midfoot bilateral. She also has pain on palpation of the toes bilateral. Her toenails are thick yellow dystrophic onychomycotic bilaterally. Multiple porokeratotic lesions plantar aspect bilateral foot bilateral. Pain on palpation of bilateral heels.   Assessment: Diabetic peripheral neuropathy with a history of peripheral vascular disease hammertoe deformity and osteoarthritis. Porokeratosis bilateral. Pain in limb secondary to onychomycosis bilateral. Plantar fasciitis bilateral.  Plan: Debridement of nails and porokeratotic lesions bilateral. Injected bilateral heels with kenalog and local anesthetic. Will follow up with her in 2 months.

## 2014-12-23 ENCOUNTER — Encounter: Payer: Self-pay | Admitting: Vascular Surgery

## 2014-12-24 ENCOUNTER — Encounter: Payer: Self-pay | Admitting: Vascular Surgery

## 2014-12-24 ENCOUNTER — Ambulatory Visit (HOSPITAL_COMMUNITY)
Admission: RE | Admit: 2014-12-24 | Discharge: 2014-12-24 | Disposition: A | Discharge status: Home / Self Care | Payer: Medicare Other | Source: Ambulatory Visit | Attending: Vascular Surgery | Admitting: Vascular Surgery

## 2014-12-24 ENCOUNTER — Ambulatory Visit (INDEPENDENT_AMBULATORY_CARE_PROVIDER_SITE_OTHER): Payer: Medicare Other | Admitting: Vascular Surgery

## 2014-12-24 VITALS — BP 133/89 | HR 82 | Temp 98.1°F | Resp 16 | Ht 64.0 in | Wt 180.0 lb

## 2014-12-24 DIAGNOSIS — I714 Abdominal aortic aneurysm, without rupture, unspecified: Secondary | ICD-10-CM

## 2014-12-24 NOTE — Progress Notes (Signed)
[facility]ist of   Patient name: [name] MRN: [id number] DOB: [birth year] Sex: female  REASON FOR VISIT: Follow up of abdominal aortic aneurysm  HPI: [name] is a 81 y.o. female who I last saw on 12/18/2013. She had a small 3.4 cm infrarenal abdominal aortic aneurysm. She was not a smoker. Her blood pressure was under good control. I recommended a follow up duplex scan in 1 year and she returns for that study.  Since I saw her last, she denies any history of abdominal pain or back pain.  She is not a smoker. Her blood pressure has been well-controlled. Her have been no significant changes in her medical history.  Past Medical History  Diagnosis Date  . Hypertension   . Rheumatoid arthritis(714.0)   . S/P carpal tunnel release   . Diabetes mellitus     pre-diabetes  . Diverticulosis   . Osteoarthritis   . Nephrolithiasis   . Osteoporosis   . Hyperlipidemia   . Dementia   . OSA on CPAP   . [name] (abdominal aortic aneurysm) 09/2013    3.6 cm  . Spinal stenosis    Family History  Problem Relation Age of Onset  . Stroke Father   . Coronary artery disease Son     CABG at age 8  . Heart disease Son   . Heart attack Son   . Stroke Mother   . Hypertension Sister   . Diabetes Brother   . Hypertension Maternal Aunt    SOCIAL HISTORY: Social History  Substance Use Topics  . Smoking status: Never Smoker   . Smokeless tobacco: Never Used  . Alcohol Use: No   Allergies  Allergen Reactions  . Sulfonamide Derivatives Hives and Rash   Current Outpatient Prescriptions  Medication Sig Dispense Refill  . aspirin EC 81 MG tablet Take 1 tablet (81 mg total) by mouth daily. 150 tablet 2  . atenolol (TENORMIN) 25 MG tablet Take 1 tablet (25 mg total) by mouth daily. 90 tablet 3  . azithromycin (ZITHROMAX) 250 MG tablet Take 2 daily for 5 days. 10 tablet 0  . Calcium-Vitamin D 600-200 MG-UNIT per tablet Take 1 tablet by mouth 3 (three) times daily  with meals.    . DULoxetine (CYMBALTA) 30 MG capsule 30 mg 2 (two) times daily. Twice daily  2  . DULoxetine (CYMBALTA) 60 MG capsule   0  . gabapentin (NEURONTIN) 800 MG tablet TAKE 1 TABLET BY MOUTH EVERY MORNING AND AT LUNCH AND 2 TABLETS EVERY NIGHT AT BEDTIME 360 tablet 0  . hydrochlorothiazide (HYDRODIURIL) 25 MG tablet Take 1 tablet (25 mg total) by mouth daily. 90 tablet 3  . Omega-3 Fatty Acids (FISH OIL) 1000 MG CAPS Take by mouth. 1 tab twice a day    . oxymorphone (OPANA) 5 MG tablet Take 5 mg by mouth 3 (three) times daily.    . simvastatin (ZOCOR) 40 MG tablet Take 1 tablet (40 mg total) by mouth at bedtime. 90 tablet 3   No current facility-administered medications for this visit.   REVIEW OF SYSTEMS: Valu.Nieves ] denotes positive finding; [  ] denotes negative finding  CARDIOVASCULAR:  [ ]  chest pain   [ ]  chest pressure   [ ]  palpitations   [ ]  orthopnea   [ ]  dyspnea on exertion   [ ]  claudication   [ ]  rest pain   [ ]  DVT   [ ]  phlebitis PULMONARY:   [ ]   productive cough   [ ]  asthma   [ ]  wheezing NEUROLOGIC:   [ ]  weakness  [ ]  paresthesias  [ ]  aphasia  [ ]  amaurosis  [ ]  dizziness HEMATOLOGIC:   [ ]  bleeding problems   [ ]  clotting disorders MUSCULOSKELETAL:  [ ]  joint pain   [ ]  joint swelling Valu.Nieves ] leg swelling GASTROINTESTINAL: [ ]   blood in stool  [ ]   hematemesis GENITOURINARY:  [ ]   dysuria  [ ]   hematuria PSYCHIATRIC:  [ ]  history of major depression INTEGUMENTARY:  [ ]  rashes  [ ]  ulcers CONSTITUTIONAL:  [ ]  fever   [ ]  chills  PHYSICAL EXAM: Filed Vitals:   12/24/14 0907  BP: 133/89  Pulse: 82  Temp: 98.1 F (36.7 C)  TempSrc: Oral  Resp: 16  Height: 5\' 4"  (1.626 m)  Weight: 180 lb (81.647 kg)  SpO2: 95%   GENERAL: The patient is a well-nourished female, in no acute distress. The vital signs are documented above. CARDIAC: There is a regular rate and rhythm.  VASCULAR: I do not detect carotid bruits. She has palpable popliteal pulses bilaterally. Her feet  are warm and well-perfused. PULMONARY: There is good air exchange bilaterally without wheezing or rales. ABDOMEN: Soft and non-tender with normal pitched bowel sounds.  MUSCULOSKELETAL: There are no major deformities or cyanosis. NEUROLOGIC: No focal weakness or paresthesias are detected. SKIN: There are no ulcers or rashes noted. PSYCHIATRIC: The patient has a normal affect.  DATA:  ABDOMINAL AORTIC DUPLEX: A maximum diameter of her abdominal aortic aneurysm is 3.2 cm which is not changed compared to the study a year ago. The right common iliac artery measures 1.5 cm in maximum diameter. The left common iliac artery measures 1.28 cm in maximum diameter.  MEDICAL ISSUES: ABDOMINAL AORTIC ANEURYSM: She has a small 3.24 cm abdominal aortic aneurysm. This has not changed in size over the last year. For this reason I recommended that we extend her follow up out to 18 months. I have ordered a follow up duplex scan in 18 months. I will see her back at that time. She is not a smoker. Her blood pressure is under good control.   Return in about 18 months (around 06/25/2016).   Deitra Mayo [facility] [place]: [phone]

## 2014-12-24 NOTE — Addendum Note (Signed)
Addended by: Dorthula Rue L on: 12/24/2014 10:05 AM   Modules accepted: Orders

## 2015-01-15 ENCOUNTER — Encounter: Payer: Self-pay | Admitting: Physician Assistant

## 2015-01-15 ENCOUNTER — Ambulatory Visit (INDEPENDENT_AMBULATORY_CARE_PROVIDER_SITE_OTHER): Payer: Medicare Other | Admitting: Physician Assistant

## 2015-01-15 VITALS — BP 118/76 | HR 76 | Temp 98.4°F | Resp 20 | Wt 180.0 lb

## 2015-01-15 DIAGNOSIS — J020 Streptococcal pharyngitis: Secondary | ICD-10-CM

## 2015-01-15 DIAGNOSIS — J029 Acute pharyngitis, unspecified: Secondary | ICD-10-CM

## 2015-01-15 LAB — RAPID STREP SCREEN (MED CTR MEBANE ONLY): Streptococcus, Group A Screen (Direct): POSITIVE — AB

## 2015-01-15 MED ORDER — AMOXICILLIN-POT CLAVULANATE 500-125 MG PO TABS: 1.0000 | ORAL_TABLET | Freq: Three times a day (TID) | ORAL | Status: DC

## 2015-01-15 NOTE — Progress Notes (Signed)
Patient ID: [name] MRN: [id number], DOB: 01/24/1934, 81 y.o. Date of Encounter: 01/15/2015, 4:09 PM    Chief Complaint:  Chief Complaint  Patient presents with  . throat pain/swelling x 1 day     HPI: 81 y.o. year old white female says that she has had an odd sensation in her throat/neck starting yesterday. Says that it feels a little bit like a pressure around the anterior aspect of her upper neck. Minimal sore throat when swallowing. Has had no drainage from the nose and no cough. No known fevers or chills.     Home Meds:   Outpatient Prescriptions Prior to Visit  Medication Sig Dispense Refill  . aspirin EC 81 MG tablet Take 1 tablet (81 mg total) by mouth daily. 150 tablet 2  . atenolol (TENORMIN) 25 MG tablet Take 1 tablet (25 mg total) by mouth daily. 90 tablet 3  . Calcium-Vitamin D 600-200 MG-UNIT per tablet Take 1 tablet by mouth 3 (three) times daily with meals.    . DULoxetine (CYMBALTA) 30 MG capsule 30 mg daily.   2  . DULoxetine (CYMBALTA) 60 MG capsule   0  . gabapentin (NEURONTIN) 800 MG tablet TAKE 1 TABLET BY MOUTH EVERY MORNING AND AT LUNCH AND 2 TABLETS EVERY NIGHT AT BEDTIME 360 tablet 0  . hydrochlorothiazide (HYDRODIURIL) 25 MG tablet Take 1 tablet (25 mg total) by mouth daily. 90 tablet 3  . Omega-3 Fatty Acids (FISH OIL) 1000 MG CAPS Take by mouth. 1 tab twice a day    . oxymorphone (OPANA) 5 MG tablet Take 5 mg by mouth 3 (three) times daily.    . simvastatin (ZOCOR) 40 MG tablet Take 1 tablet (40 mg total) by mouth at bedtime. 90 tablet 3  . azithromycin (ZITHROMAX) 250 MG tablet Take 2 daily for 5 days. 10 tablet 0   No facility-administered medications prior to visit.    Allergies:  Allergies  Allergen Reactions  . Sulfonamide Derivatives Hives and Rash      Review of Systems: See HPI for pertinent ROS. All other ROS negative.    Physical Exam: Blood pressure 118/76, pulse 76, temperature 98.4 F (36.9 C), temperature source Oral,  resp. rate 20, weight 180 lb (81.647 kg)., Body mass index is 30.88 kg/(m^2). General:  Overweight white female. Appears in no acute distress. HEENT: Normocephalic, atraumatic, eyes without discharge, sclera non-icteric, nares are without discharge. Bilateral auditory canals clear, TM's are without perforation, pearly grey and translucent with reflective cone of light bilaterally. Oral cavity moist, posterior pharynx with moderate erythema but no exudate. No peritonsillar abscess. Neck: Supple. No thyromegaly. No lymphadenopathy. She reports no real tenderness with palpation of nodes and I feel no enlarged nodes that she is overweight. Lungs: Clear bilaterally to auscultation without wheezes, rales, or rhonchi. Breathing is unlabored. Heart: Regular rhythm. No murmurs, rubs, or gallops. Msk:  Strength and tone normal for age. Extremities/Skin: Warm and dry.  No rashes. Neuro: Alert and oriented X 3. Moves all extremities spontaneously. Gait is normal. CNII-XII grossly in tact. Psych:  Responds to questions appropriately with a normal affect.   Results for orders placed or performed in visit on 01/15/15  Rapid strep screen (not at [facility])  Result Value Ref Range   Source THROAT    Streptococcus, Group A Screen (Direct) POS (A) NEGATIVE     ASSESSMENT AND PLAN:  81 y.o. year old female with  1. Strep pharyngitis She is to start anti-biotic immediately, take  as directed, and complete all of it. Follow-up if symptoms worsen significantly or do not resolve in completion of anti-biotic. - amoxicillin-clavulanate (AUGMENTIN) 500-125 MG per tablet; Take 1 tablet (500 mg total) by mouth 3 (three) times daily.  Dispense: 30 tablet; Refill: 0  2. Sorethroat - Rapid strep screen (not at [facility])   Signed, [facility], [place], [facility] 01/15/2015 4:09 PM

## 2015-01-22 ENCOUNTER — Other Ambulatory Visit: Payer: Self-pay | Admitting: Podiatry

## 2015-01-28 DIAGNOSIS — Z6835 Body mass index (BMI) 35.0-35.9, adult: Secondary | ICD-10-CM | POA: Diagnosis not present

## 2015-01-28 DIAGNOSIS — M47816 Spondylosis without myelopathy or radiculopathy, lumbar region: Secondary | ICD-10-CM | POA: Diagnosis not present

## 2015-01-28 DIAGNOSIS — I10 Essential (primary) hypertension: Secondary | ICD-10-CM | POA: Diagnosis not present

## 2015-02-03 ENCOUNTER — Encounter: Payer: Self-pay | Admitting: Podiatry

## 2015-02-03 ENCOUNTER — Ambulatory Visit (INDEPENDENT_AMBULATORY_CARE_PROVIDER_SITE_OTHER): Payer: Medicare Other | Admitting: Podiatry

## 2015-02-03 DIAGNOSIS — Q828 Other specified congenital malformations of skin: Secondary | ICD-10-CM | POA: Diagnosis not present

## 2015-02-03 DIAGNOSIS — M79676 Pain in unspecified toe(s): Secondary | ICD-10-CM | POA: Diagnosis not present

## 2015-02-03 DIAGNOSIS — E1142 Type 2 diabetes mellitus with diabetic polyneuropathy: Secondary | ICD-10-CM

## 2015-02-03 DIAGNOSIS — M779 Enthesopathy, unspecified: Secondary | ICD-10-CM

## 2015-02-03 DIAGNOSIS — B351 Tinea unguium: Secondary | ICD-10-CM | POA: Diagnosis not present

## 2015-02-03 DIAGNOSIS — M722 Plantar fascial fibromatosis: Secondary | ICD-10-CM | POA: Diagnosis not present

## 2015-02-03 NOTE — Progress Notes (Signed)
She presents today with a chief complaint of painful feet bilaterally. She also have her painfully elongated toenails trimmed. She states that her left foot hurts in the medial longitudinal arch deep within the foot. 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  Objective: Pulses are palpable bilateral. Neurologic sensorium is diminished persons once the monofilament. She has pain on palpation of the second metatarsal head of the right foot there is no joint present. She also has pain on palpation of the plantar fascia medial and 2 arch of the left foot. She has painful elongated toenails bilaterally.  Assessment: Plantar fasciitis left foot. Metatarsalgia capsulitis second metatarsophalangeal joint area of the right foot. Pain in limb secondary to onychomycosis bilateral foot.  Plan: Debridement of nails bilaterally. Greater than 6 nails were debrided. Injected the medial longitudinal arch area of the left foot and the second metatarsal head area of the right foot. These were injected with Kenalog and local and aesthetic I will follow-up with her in 2-3 months.

## 2015-03-17 ENCOUNTER — Ambulatory Visit (INDEPENDENT_AMBULATORY_CARE_PROVIDER_SITE_OTHER): Payer: Medicare Other | Admitting: Podiatry

## 2015-03-17 ENCOUNTER — Encounter: Payer: Self-pay | Admitting: Podiatry

## 2015-03-17 ENCOUNTER — Ambulatory Visit (INDEPENDENT_AMBULATORY_CARE_PROVIDER_SITE_OTHER): Payer: Medicare Other

## 2015-03-17 VITALS — BP 136/75 | HR 75 | Resp 16

## 2015-03-17 DIAGNOSIS — M79672 Pain in left foot: Secondary | ICD-10-CM

## 2015-03-17 DIAGNOSIS — Q828 Other specified congenital malformations of skin: Secondary | ICD-10-CM

## 2015-03-17 DIAGNOSIS — M79674 Pain in right toe(s): Secondary | ICD-10-CM | POA: Diagnosis not present

## 2015-03-17 DIAGNOSIS — M79675 Pain in left toe(s): Secondary | ICD-10-CM | POA: Diagnosis not present

## 2015-03-17 DIAGNOSIS — E1142 Type 2 diabetes mellitus with diabetic polyneuropathy: Secondary | ICD-10-CM | POA: Diagnosis not present

## 2015-03-17 DIAGNOSIS — B353 Tinea pedis: Secondary | ICD-10-CM

## 2015-03-17 MED ORDER — CLOTRIMAZOLE 1 % EX CREA: 1.0000 "application " | TOPICAL_CREAM | Freq: Two times a day (BID) | CUTANEOUS | Status: DC

## 2015-03-17 NOTE — Progress Notes (Signed)
She presents today with chief complaint of painful elongated toenails 1 through 5 bilateral. She's also complaining of a red painful lesion to the lateral aspect of her left foot.  Objective: Vital signs are stable she is alert and oriented 3 decreased sensorium per Semmes-Weinstein monofilament. Pulses pulses are palpable bilateral. Her nails are thick yellow dystrophic onychomycotic 1 through 5 bilateral. There is a small area of erythema along the lateral aspect of the left foot it appears to be tinea pedis.  Assessment: Pain in limb secondary to onychomycosis diabetic peripheral neuropathy and tinea pedis.  Plan: Prescribed Spectazole cream to be applied twice daily and I debridement nails 1 through 5 bilateral. Follow up with her in 3 months.

## 2015-03-31 ENCOUNTER — Encounter: Payer: Self-pay | Admitting: Family Medicine

## 2015-03-31 ENCOUNTER — Ambulatory Visit (INDEPENDENT_AMBULATORY_CARE_PROVIDER_SITE_OTHER): Payer: Medicare Other | Admitting: Family Medicine

## 2015-03-31 VITALS — BP 130/74 | HR 76 | Temp 98.6°F | Resp 18 | Ht 60.0 in | Wt 188.0 lb

## 2015-03-31 DIAGNOSIS — N393 Stress incontinence (female) (male): Secondary | ICD-10-CM | POA: Diagnosis not present

## 2015-03-31 DIAGNOSIS — N8110 Cystocele, unspecified: Secondary | ICD-10-CM | POA: Diagnosis not present

## 2015-03-31 DIAGNOSIS — R350 Frequency of micturition: Secondary | ICD-10-CM | POA: Diagnosis not present

## 2015-03-31 DIAGNOSIS — R32 Unspecified urinary incontinence: Secondary | ICD-10-CM

## 2015-03-31 LAB — URINALYSIS, ROUTINE W REFLEX MICROSCOPIC
Bilirubin Urine: NEGATIVE
Glucose, UA: NEGATIVE
Hgb urine dipstick: NEGATIVE
Ketones, ur: NEGATIVE
Leukocytes, UA: NEGATIVE
Nitrite: NEGATIVE
Protein, ur: NEGATIVE
Specific Gravity, Urine: 1.02 (ref 1.001–1.035)
pH: 7 (ref 5.0–8.0)

## 2015-03-31 NOTE — Progress Notes (Signed)
Subjective:    Patient ID: [name], female    DOB: February 04, 1934, 81 y.o.   MRN: [id number]  HPI Patient reports greater than 6 months of urinary incontinence. It occurs 1-2 times per day without warning. She will experience urinary leakage. It is not associated with coughing or laughing or sneezing. It tends to be associated with prolonged standing. She will frequently wet her clothes prior to making it to the restroom.  She denies any sudden urges to go to the restroom. On examination today she has a hernia of anterior vaginal wall causing the urethrovesical junction to be visible less than 2 cm from the hymenal ring.  Past Medical History  Diagnosis Date  . Hypertension   . Rheumatoid arthritis(714.0)   . S/P carpal tunnel release   . Diabetes mellitus     pre-diabetes  . Diverticulosis   . Osteoarthritis   . Nephrolithiasis   . Osteoporosis   . Hyperlipidemia   . Dementia   . OSA on CPAP   . [name] (abdominal aortic aneurysm) ([place]) 09/2013    3.6 cm  . Spinal stenosis    Past Surgical History  Procedure Laterality Date  . Total knee arthroplasty  08/21/2007    Right total knee replacement  . Carpal tunnel release  12/27/2006    Right subcutaneous ulnar nerve transfer -- Right open carpal tunnel release  . Foot surgery    . Cholecystectomy    . Appendectomy    . Joint replacement      BTKR, RTHR   Current Outpatient Prescriptions on File Prior to Visit  Medication Sig Dispense Refill  . aspirin EC 81 MG tablet Take 1 tablet (81 mg total) by mouth daily. 150 tablet 2  . atenolol (TENORMIN) 25 MG tablet Take 1 tablet (25 mg total) by mouth daily. 90 tablet 3  . Calcium-Vitamin D 600-200 MG-UNIT per tablet Take 1 tablet by mouth 3 (three) times daily with meals.    . clotrimazole (LOTRIMIN) 1 % cream Apply 1 application topically 2 (two) times daily. Apply to affected area twice daily. 30 g 0  . DULoxetine (CYMBALTA) 30 MG capsule Take 30 mg by mouth 3 (three) times daily.    2  . gabapentin (NEURONTIN) 800 MG tablet TAKE 1 TABLET BY MOUTH EVERY MORNING AND AT LUNCH, 2 TABLETS BY MOUTH EVERY NIGHT AT BEDTIME 360 tablet 0  . hydrochlorothiazide (HYDRODIURIL) 25 MG tablet Take 1 tablet (25 mg total) by mouth daily. 90 tablet 3  . Omega-3 Fatty Acids (FISH OIL) 1000 MG CAPS Take by mouth. 1 tab twice a day    . oxymorphone (OPANA) 5 MG tablet Take 5 mg by mouth 3 (three) times daily.    . simvastatin (ZOCOR) 40 MG tablet Take 1 tablet (40 mg total) by mouth at bedtime. 90 tablet 3   No current facility-administered medications on file prior to visit.   Allergies  Allergen Reactions  . Sulfonamide Derivatives Hives and Rash   Social History   Social History  . Marital Status: Divorced    Spouse Name: N/A  . Number of Children: 6  . Years of Education: N/A   Occupational History  . Not on file.   Social History Main Topics  . Smoking status: Never Smoker   . Smokeless tobacco: Never Used  . Alcohol Use: No  . Drug Use: No  . Sexual Activity: Not on file   Other Topics Concern  . Not on file  Social History Narrative      Review of Systems  All other systems reviewed and are negative.      Objective:   Physical Exam  Cardiovascular: Normal rate, regular rhythm and normal heart sounds.   Pulmonary/Chest: Effort normal and breath sounds normal.  Abdominal: Hernia confirmed negative in the right inguinal area and confirmed negative in the left inguinal area.  Genitourinary: Vagina normal. Pelvic exam was performed with patient supine.  Lymphadenopathy:       Right: No inguinal adenopathy present.       Left: No inguinal adenopathy present.  Vitals reviewed.  lease see the description in the history of present illness. The urethrovesical junction is visible less than 2 cm from the hymenal ring         Assessment & Plan:  Frequent urination - Plan: Urinalysis, Routine w reflex microscopic (not at [facility])  Incontinence in  female  Bladder prolapse, female, acquired   I believe the majority of the patient's symptoms are due to her anterior vaginal wall hernia and pelvic organ/bladder prolapse and I will gladly consult urology for possible surgical options. However prior to this I would like the patient to try Vesicare 5 mg by mouth daily just to see if the symptoms will improve enough to prevent surgery given her advanced age. She will try samples of the medication and then call me and let me know how it is working. Her urinalysis today is completely normal

## 2015-04-19 ENCOUNTER — Other Ambulatory Visit: Payer: Self-pay | Admitting: Podiatry

## 2015-05-07 ENCOUNTER — Encounter: Payer: Self-pay | Admitting: Podiatry

## 2015-05-07 ENCOUNTER — Ambulatory Visit (INDEPENDENT_AMBULATORY_CARE_PROVIDER_SITE_OTHER): Payer: Medicare Other | Admitting: Podiatry

## 2015-05-07 DIAGNOSIS — E1142 Type 2 diabetes mellitus with diabetic polyneuropathy: Secondary | ICD-10-CM | POA: Diagnosis not present

## 2015-05-07 DIAGNOSIS — M461 Sacroiliitis, not elsewhere classified: Secondary | ICD-10-CM | POA: Insufficient documentation

## 2015-05-07 DIAGNOSIS — M79676 Pain in unspecified toe(s): Secondary | ICD-10-CM | POA: Diagnosis not present

## 2015-05-07 DIAGNOSIS — Q828 Other specified congenital malformations of skin: Secondary | ICD-10-CM

## 2015-05-07 DIAGNOSIS — B351 Tinea unguium: Secondary | ICD-10-CM | POA: Diagnosis not present

## 2015-05-07 NOTE — Progress Notes (Signed)
She resists that she complained of painful elongated nails.  Objective: Pulses are palpable bilateral. Nails are thick yellow dystrophic onychomycotic bilateral foot.  Says: Pain limb secondary onychomycosis bilateral foot.  Plan: Debridement of nails greater than 6 today.

## 2015-05-13 DIAGNOSIS — Z6836 Body mass index (BMI) 36.0-36.9, adult: Secondary | ICD-10-CM | POA: Diagnosis not present

## 2015-05-13 DIAGNOSIS — M461 Sacroiliitis, not elsewhere classified: Secondary | ICD-10-CM | POA: Diagnosis not present

## 2015-05-27 ENCOUNTER — Encounter (HOSPITAL_COMMUNITY): Payer: Self-pay

## 2015-05-27 ENCOUNTER — Emergency Department (HOSPITAL_COMMUNITY): Payer: Medicare Other

## 2015-05-27 ENCOUNTER — Emergency Department (HOSPITAL_COMMUNITY)
Admission: EM | Admit: 2015-05-27 | Discharge: 2015-05-27 | Disposition: A | Discharge status: Home / Self Care | Payer: Medicare Other | Attending: Emergency Medicine | Admitting: Emergency Medicine

## 2015-05-27 DIAGNOSIS — Z9981 Dependence on supplemental oxygen: Secondary | ICD-10-CM | POA: Insufficient documentation

## 2015-05-27 DIAGNOSIS — E785 Hyperlipidemia, unspecified: Secondary | ICD-10-CM | POA: Diagnosis not present

## 2015-05-27 DIAGNOSIS — G4730 Sleep apnea, unspecified: Secondary | ICD-10-CM | POA: Insufficient documentation

## 2015-05-27 DIAGNOSIS — W06XXXA Fall from bed, initial encounter: Secondary | ICD-10-CM | POA: Insufficient documentation

## 2015-05-27 DIAGNOSIS — Y9389 Activity, other specified: Secondary | ICD-10-CM | POA: Insufficient documentation

## 2015-05-27 DIAGNOSIS — Y92009 Unspecified place in unspecified non-institutional (private) residence as the place of occurrence of the external cause: Secondary | ICD-10-CM | POA: Insufficient documentation

## 2015-05-27 DIAGNOSIS — Z8719 Personal history of other diseases of the digestive system: Secondary | ICD-10-CM | POA: Diagnosis not present

## 2015-05-27 DIAGNOSIS — Y998 Other external cause status: Secondary | ICD-10-CM | POA: Insufficient documentation

## 2015-05-27 DIAGNOSIS — Z79899 Other long term (current) drug therapy: Secondary | ICD-10-CM | POA: Insufficient documentation

## 2015-05-27 DIAGNOSIS — S00211A Abrasion of right eyelid and periocular area, initial encounter: Secondary | ICD-10-CM | POA: Diagnosis not present

## 2015-05-27 DIAGNOSIS — M810 Age-related osteoporosis without current pathological fracture: Secondary | ICD-10-CM | POA: Insufficient documentation

## 2015-05-27 DIAGNOSIS — R55 Syncope and collapse: Secondary | ICD-10-CM

## 2015-05-27 DIAGNOSIS — R42 Dizziness and giddiness: Secondary | ICD-10-CM | POA: Diagnosis not present

## 2015-05-27 DIAGNOSIS — Z7982 Long term (current) use of aspirin: Secondary | ICD-10-CM | POA: Diagnosis not present

## 2015-05-27 DIAGNOSIS — I10 Essential (primary) hypertension: Secondary | ICD-10-CM | POA: Insufficient documentation

## 2015-05-27 DIAGNOSIS — Z87442 Personal history of urinary calculi: Secondary | ICD-10-CM | POA: Diagnosis not present

## 2015-05-27 DIAGNOSIS — F0390 Unspecified dementia without behavioral disturbance: Secondary | ICD-10-CM | POA: Insufficient documentation

## 2015-05-27 DIAGNOSIS — M069 Rheumatoid arthritis, unspecified: Secondary | ICD-10-CM | POA: Diagnosis not present

## 2015-05-27 DIAGNOSIS — S0993XA Unspecified injury of face, initial encounter: Secondary | ICD-10-CM | POA: Diagnosis present

## 2015-05-27 LAB — CBC WITH DIFFERENTIAL/PLATELET
Basophils Absolute: 0.1 10*3/uL (ref 0.0–0.1)
Basophils Relative: 1 %
Eosinophils Absolute: 0.2 10*3/uL (ref 0.0–0.7)
Eosinophils Relative: 2 %
HCT: 43.9 % (ref 36.0–46.0)
Hemoglobin: 13.6 g/dL (ref 12.0–15.0)
Lymphocytes Relative: 29 %
Lymphs Abs: 3 10*3/uL (ref 0.7–4.0)
MCH: 29.4 pg (ref 26.0–34.0)
MCHC: 31 g/dL (ref 30.0–36.0)
MCV: 94.8 fL (ref 78.0–100.0)
Monocytes Absolute: 0.8 10*3/uL (ref 0.1–1.0)
Monocytes Relative: 8 %
Neutro Abs: 6.4 10*3/uL (ref 1.7–7.7)
Neutrophils Relative %: 60 %
Platelets: 199 10*3/uL (ref 150–400)
RBC: 4.63 MIL/uL (ref 3.87–5.11)
RDW: 13.9 % (ref 11.5–15.5)
WBC: 10.6 10*3/uL — ABNORMAL HIGH (ref 4.0–10.5)

## 2015-05-27 LAB — CBG MONITORING, ED: Glucose-Capillary: 104 mg/dL — ABNORMAL HIGH (ref 65–99)

## 2015-05-27 LAB — BASIC METABOLIC PANEL
Anion gap: 12 (ref 5–15)
BUN: 24 mg/dL — ABNORMAL HIGH (ref 6–20)
CO2: 28 mmol/L (ref 22–32)
Calcium: 9.4 mg/dL (ref 8.9–10.3)
Chloride: 101 mmol/L (ref 101–111)
Creatinine, Ser: 0.8 mg/dL (ref 0.44–1.00)
GFR calc Af Amer: >60 mL/min (ref >60)
GFR calc non Af Amer: >60 mL/min (ref >60)
Glucose, Bld: 110 mg/dL — ABNORMAL HIGH (ref 65–99)
Potassium: 4.2 mmol/L (ref 3.5–5.1)
Sodium: 141 mmol/L (ref 135–145)

## 2015-05-27 LAB — URINALYSIS, ROUTINE W REFLEX MICROSCOPIC
Bilirubin Urine: NEGATIVE
Glucose, UA: NEGATIVE mg/dL
Hgb urine dipstick: NEGATIVE
Ketones, ur: NEGATIVE mg/dL
Leukocytes, UA: NEGATIVE
Nitrite: NEGATIVE
Protein, ur: NEGATIVE mg/dL
Specific Gravity, Urine: 1.02 (ref 1.005–1.030)
pH: 5 (ref 5.0–8.0)

## 2015-05-27 LAB — TROPONIN I: Troponin I: <0.03 ng/mL (ref <0.031)

## 2015-05-27 MED ORDER — ACETAMINOPHEN 325 MG PO TABS
650.0000 mg | ORAL_TABLET | Freq: Once | ORAL | Status: AC
  Administered 2015-05-27: 650 mg via ORAL
  Filled 2015-05-27: qty 2

## 2015-05-27 NOTE — Discharge Instructions (Signed)
You were seen in the emergency room today for evaluation of dizziness and a fall.  Your workup today was unremarkable. Please call your primary care provider to schedule a follow up appointment within one week. Please remember to drink plenty of water and be careful whenever you stand up. Return to the ER for new or worsening symptoms.    Dizziness Dizziness is a common problem. It is a feeling of unsteadiness or light-headedness. You may feel like you are about to faint. Dizziness can lead to injury if you stumble or fall. Anyone can become dizzy, but dizziness is more common in older adults. This condition can be caused by a number of things, including medicines, dehydration, or illness. HOME CARE INSTRUCTIONS Taking these steps may help with your condition: Eating and Drinking  Drink enough fluid to keep your urine clear or pale yellow. This helps to keep you from becoming dehydrated. Try to drink more clear fluids, such as water.  Do not drink alcohol.  Limit your caffeine intake if directed by your health care provider.  Limit your salt intake if directed by your health care provider. Activity  Avoid making quick movements.  Rise slowly from chairs and steady yourself until you feel okay.  In the morning, first sit up on the side of the bed. When you feel okay, stand slowly while you hold onto something until you know that your balance is fine.  Move your legs often if you need to stand in one place for a long time. Tighten and relax your muscles in your legs while you are standing.  Do not drive or operate heavy machinery if you feel dizzy.  Avoid bending down if you feel dizzy. Place items in your home so that they are easy for you to reach without leaning over. Lifestyle  Do not use any tobacco products, including cigarettes, chewing tobacco, or electronic cigarettes. If you need help quitting, ask your health care provider.  Try to reduce your stress level, such as with yoga  or meditation. Talk with your health care provider if you need help. General Instructions  Watch your dizziness for any changes.  Take medicines only as directed by your health care provider. Talk with your health care provider if you think that your dizziness is caused by a medicine that you are taking.  Tell a friend or a family member that you are feeling dizzy. If he or she notices any changes in your behavior, have this person call your health care provider.  Keep all follow-up visits as directed by your health care provider. This is important. SEEK MEDICAL CARE IF:  Your dizziness does not go away.  Your dizziness or light-headedness gets worse.  You feel nauseous.  You have reduced hearing.  You have new symptoms.  You are unsteady on your feet or you feel like the room is spinning. SEEK IMMEDIATE MEDICAL CARE IF:  You vomit or have diarrhea and are unable to eat or drink anything.  You have problems talking, walking, swallowing, or using your arms, hands, or legs.  You feel generally weak.  You are not thinking clearly or you have trouble forming sentences. It may take a friend or family member to notice this.  You have chest pain, abdominal pain, shortness of breath, or sweating.  Your vision changes.  You notice any bleeding.  You have a headache.  You have neck pain or a stiff neck.  You have a fever.   This information is not  intended to replace advice given to you by your health care provider. Make sure you discuss any questions you have with your health care provider.   Document Released: 10/12/2000 Document Revised: 09/02/2014 Document Reviewed: 04/14/2014 Elsevier Interactive Patient Education [employer].

## 2015-05-27 NOTE — Progress Notes (Addendum)
CSW spoke with patient at bedside. Patient's son, [name] was present during this encounter. Patient reports she had to go to the bathroom and she sat back on the bed to regain her strength. Patient reports she got her cane to go to the bathroom and she fell bedside the bed. Patient reports when she fell she hit her head on the rocking chair.   Patient's son reports patient resides at home with him and his two brothers. He reports his two brothers were asleep at the time of the fall and he works third shift. He reports patient has a cane and a walker. Patient and son both report patient has good family support. Patient reports she is able to complete her ADL's on her own. No questions noted for CSW at this time.   [name], son [phone]  [name] [id number] ED CSW 05/27/2015 9:43 AM

## 2015-05-27 NOTE — ED Notes (Signed)
She states she tripped at home and fell (onto a soft padded surface), however she struck the edge of a chair on her way down thereby lac. Just beneath her right eyebrow.

## 2015-05-27 NOTE — ED Provider Notes (Signed)
CSN: [id number]     Arrival date & time 05/27/15  [id number] History   First MD Initiated Contact with Patient 05/27/15 [phone]     Chief Complaint  Patient presents with  . Fall  . Facial Laceration    HPI   [name] is an 82 y.o. female with history of HTN,  [name], Chronic pain on Opana who presents to the ED for evaluation following a pre-syncopal episode. Pt states she got out of bed this morning, felt dizzy, and fell to her carpeted floor. States on the way down she bumped her head on the corner of a chair. She states she did not lose consciousness completely. States that on arrival to the ED she still felt a little bit dizzy but this has resolved after resting in bed a few minutes. Denies chest pain, SOB, weakness, or headache. She reports a similar episode happened one week ago where she felt dizzy getting up from bed and then fell without complete LOC. She states the entire episode lasted a few minutes. States this has never happened before this past week. Denies changes to medication. States she has been on opana for arthritis pain for a long time and has not had any recent changes in dose. She is not on any anticoagulation.   Past Medical History  Diagnosis Date  . Hypertension   . Rheumatoid arthritis(714.0)   . S/P carpal tunnel release   . Diabetes mellitus     pre-diabetes  . Diverticulosis   . Osteoarthritis   . Nephrolithiasis   . Osteoporosis   . Hyperlipidemia   . Dementia   . OSA on CPAP   . [name] (abdominal aortic aneurysm) ([place]) 09/2013    3.6 cm  . Spinal stenosis    Past Surgical History  Procedure Laterality Date  . Total knee arthroplasty  08/21/2007    Right total knee replacement  . Carpal tunnel release  12/27/2006    Right subcutaneous ulnar nerve transfer -- Right open carpal tunnel release  . Foot surgery    . Cholecystectomy    . Appendectomy    . Joint replacement      BTKR, RTHR   Family History  Problem Relation Age of Onset  . Stroke Father   .  Coronary artery disease Son     CABG at age 50  . Heart disease Son   . Heart attack Son   . Stroke Mother   . Hypertension Sister   . Diabetes Brother   . Hypertension Maternal Aunt    Social History  Substance Use Topics  . Smoking status: Never Smoker   . Smokeless tobacco: Never Used  . Alcohol Use: No   OB History    No data available     Review of Systems  All other systems reviewed and are negative.     Allergies  Sulfonamide derivatives  Home Medications   Prior to Admission medications   Medication Sig Start Date End Date Taking? Authorizing Provider  aspirin EC 81 MG tablet Take 1 tablet (81 mg total) by mouth daily. 06/02/11   Lelon Perla, MD  atenolol (TENORMIN) 25 MG tablet Take 1 tablet (25 mg total) by mouth daily. 09/25/14   [name], MD  Calcium-Vitamin D 600-200 MG-UNIT per tablet Take 1 tablet by mouth 3 (three) times daily with meals.    Historical Provider, MD  clotrimazole (LOTRIMIN) 1 % cream Apply 1 application topically 2 (two) times daily. Apply to affected area  twice daily. 03/17/15   Max T Hyatt, DPM  DULoxetine (CYMBALTA) 30 MG capsule Take 30 mg by mouth 3 (three) times daily.  07/29/14   Historical Provider, MD  gabapentin (NEURONTIN) 800 MG tablet TAKE 1 TABLET BY MOUTH EVERY MORNING AND AT LUNCH, 2 TABLETS BY MOUTH EVERY NIGHT AT BEDTIME 04/20/15   Max T Hyatt, DPM  hydrochlorothiazide (HYDRODIURIL) 25 MG tablet Take 1 tablet (25 mg total) by mouth daily. 09/25/14   [name], MD  Omega-3 Fatty Acids (FISH OIL) 1000 MG CAPS Take by mouth. 1 tab twice a day    Historical Provider, MD  oxymorphone (OPANA) 5 MG tablet Take 5 mg by mouth 3 (three) times daily.    Historical Provider, MD  simvastatin (ZOCOR) 40 MG tablet Take 1 tablet (40 mg total) by mouth at bedtime. 09/30/14   [name], MD   BP 132/85 mmHg  Pulse 81  Temp(Src) 98.2 F (36.8 C) (Oral)  Resp 20  SpO2 93% Physical Exam  Constitutional: She is  oriented to person, place, and time. No distress.  HENT:  Head:    Right Ear: Tympanic membrane and external ear normal.  Left Ear: Tympanic membrane and external ear normal.  Nose: Nose normal.  Mouth/Throat: Oropharynx is clear and moist. No oropharyngeal exudate.  Eyes: Conjunctivae and EOM are normal. Pupils are equal, round, and reactive to light.  Neck: Normal range of motion. Neck supple.  Cardiovascular: Normal rate, regular rhythm, normal heart sounds and intact distal pulses.   Pulmonary/Chest: Effort normal and breath sounds normal. No respiratory distress. She has no wheezes. She exhibits no tenderness.  Abdominal: Soft. Bowel sounds are normal. She exhibits no distension. There is no tenderness. There is no rebound and no guarding.  Musculoskeletal: She exhibits no edema.  Neurological: She is alert and oriented to person, place, and time. No cranial nerve deficit.  Skin: Skin is warm and dry. She is not diaphoretic.  Psychiatric: She has a normal mood and affect.  Nursing note and vitals reviewed.  Orthostatic VS for the past 24 hrs:  BP- Lying Pulse- Lying BP- Sitting Pulse- Sitting BP- Standing at 0 minutes Pulse- Standing at 0 minutes  05/27/15 0930 112/69 mmHg 69 108/77 mmHg 75 106/69 mmHg 76       ED Course  Procedures (including critical care time) Labs Review Labs Reviewed  BASIC METABOLIC PANEL - Abnormal; Notable for the following:    Glucose, Bld 110 (*)    BUN 24 (*)    All other components within normal limits  CBC WITH DIFFERENTIAL/PLATELET - Abnormal; Notable for the following:    WBC 10.6 (*)    All other components within normal limits  CBG MONITORING, ED - Abnormal; Notable for the following:    Glucose-Capillary 104 (*)    All other components within normal limits  TROPONIN I  URINALYSIS, ROUTINE W REFLEX MICROSCOPIC (NOT AT [facility])    Imaging Review Dg Chest 2 View  05/27/2015  CLINICAL DATA:  Recent syncopal episode, dizziness EXAM:  CHEST  2 VIEW COMPARISON:  07/05/2012 FINDINGS: Cardiac shadow is at the upper limits of normal in size. The lungs are well aerated bilaterally. No focal infiltrate or sizable effusion is seen. Mild aortic calcifications are noted. IMPRESSION: No active cardiopulmonary disease. Electronically Signed   By: Inez Catalina M.D.   On: 05/27/2015 10:13   I have personally reviewed and evaluated these images and lab results as part of my medical decision-making.  EKG Interpretation   Date/Time:  Wednesday May 27 2015 09:24:39 EST Ventricular Rate:  75 PR Interval:  192 QRS Duration: 90 QT Interval:  359 QTC Calculation: 401 R Axis:   12 Text Interpretation:  Sinus rhythm Paired ventricular premature complexes  Anteroseptal infarct, age indeterminate No significant change since last  tracing Confirmed by BEATON  MD, ROBERT [phone]) on 05/27/2015 9:27:43 AM      MDM   Final diagnoses:  Pre-syncope  Dizziness    Workup unrevealing. Labs at baseline. Slight white count 10.6. Troponin, EKG, and CKR unremarkable and unchanged from prior. Pt ambulated without difficulty and remained asymptomatic. Discussed with attending MD Audie Pinto. At this time no indication for further workup or admission. VSS and pt asymptomatic. Her eyebrow lac is very superficial and no sutures required at this time. Pain relieved by tylenol. Pt instructed to f/u with PCP with strict ER return precautions. Pt and her son verbalized understanding.    Anne Ng, PA-C 05/27/15 1224  Leonard Schwartz, MD 05/28/15 [phone]

## 2015-06-11 ENCOUNTER — Encounter (HOSPITAL_COMMUNITY): Payer: Self-pay | Admitting: Emergency Medicine

## 2015-06-11 ENCOUNTER — Emergency Department (HOSPITAL_COMMUNITY)
Admission: EM | Admit: 2015-06-11 | Discharge: 2015-06-11 | Disposition: A | Discharge status: Home / Self Care | Payer: Medicare Other | Attending: Emergency Medicine | Admitting: Emergency Medicine

## 2015-06-11 ENCOUNTER — Emergency Department (HOSPITAL_COMMUNITY): Payer: Medicare Other

## 2015-06-11 DIAGNOSIS — E119 Type 2 diabetes mellitus without complications: Secondary | ICD-10-CM | POA: Diagnosis not present

## 2015-06-11 DIAGNOSIS — Z79899 Other long term (current) drug therapy: Secondary | ICD-10-CM | POA: Diagnosis not present

## 2015-06-11 DIAGNOSIS — G4733 Obstructive sleep apnea (adult) (pediatric): Secondary | ICD-10-CM | POA: Insufficient documentation

## 2015-06-11 DIAGNOSIS — X58XXXA Exposure to other specified factors, initial encounter: Secondary | ICD-10-CM | POA: Insufficient documentation

## 2015-06-11 DIAGNOSIS — Z87442 Personal history of urinary calculi: Secondary | ICD-10-CM | POA: Diagnosis not present

## 2015-06-11 DIAGNOSIS — Z8719 Personal history of other diseases of the digestive system: Secondary | ICD-10-CM | POA: Diagnosis not present

## 2015-06-11 DIAGNOSIS — Y998 Other external cause status: Secondary | ICD-10-CM | POA: Insufficient documentation

## 2015-06-11 DIAGNOSIS — Z7982 Long term (current) use of aspirin: Secondary | ICD-10-CM | POA: Diagnosis not present

## 2015-06-11 DIAGNOSIS — E785 Hyperlipidemia, unspecified: Secondary | ICD-10-CM | POA: Diagnosis not present

## 2015-06-11 DIAGNOSIS — I10 Essential (primary) hypertension: Secondary | ICD-10-CM | POA: Insufficient documentation

## 2015-06-11 DIAGNOSIS — Y9389 Activity, other specified: Secondary | ICD-10-CM | POA: Diagnosis not present

## 2015-06-11 DIAGNOSIS — F0390 Unspecified dementia without behavioral disturbance: Secondary | ICD-10-CM | POA: Insufficient documentation

## 2015-06-11 DIAGNOSIS — Y9289 Other specified places as the place of occurrence of the external cause: Secondary | ICD-10-CM | POA: Insufficient documentation

## 2015-06-11 DIAGNOSIS — S29001A Unspecified injury of muscle and tendon of front wall of thorax, initial encounter: Secondary | ICD-10-CM | POA: Diagnosis present

## 2015-06-11 DIAGNOSIS — M810 Age-related osteoporosis without current pathological fracture: Secondary | ICD-10-CM | POA: Diagnosis not present

## 2015-06-11 DIAGNOSIS — S2231XA Fracture of one rib, right side, initial encounter for closed fracture: Secondary | ICD-10-CM | POA: Diagnosis not present

## 2015-06-11 DIAGNOSIS — Z9981 Dependence on supplemental oxygen: Secondary | ICD-10-CM | POA: Insufficient documentation

## 2015-06-11 DIAGNOSIS — M069 Rheumatoid arthritis, unspecified: Secondary | ICD-10-CM | POA: Insufficient documentation

## 2015-06-11 DIAGNOSIS — S2241XA Multiple fractures of ribs, right side, initial encounter for closed fracture: Secondary | ICD-10-CM | POA: Diagnosis not present

## 2015-06-11 MED ORDER — HYDROMORPHONE HCL 2 MG/ML IJ SOLN
2.0000 mg | Freq: Once | INTRAMUSCULAR | Status: AC
Start: 2015-06-11 — End: 2015-06-11
  Administered 2015-06-11: 2 mg via INTRAMUSCULAR
  Filled 2015-06-11: qty 1

## 2015-06-11 MED ORDER — KETOROLAC TROMETHAMINE 60 MG/2ML IM SOLN
30.0000 mg | Freq: Once | INTRAMUSCULAR | Status: AC
  Administered 2015-06-11: 30 mg via INTRAMUSCULAR
  Filled 2015-06-11: qty 2

## 2015-06-11 MED ORDER — HYDROCODONE-ACETAMINOPHEN 5-325 MG PO TABS: 1.0000 | ORAL_TABLET | Freq: Four times a day (QID) | ORAL | Status: DC | PRN

## 2015-06-11 NOTE — Discharge Instructions (Signed)

## 2015-06-11 NOTE — ED Notes (Signed)
Leaning over washer and felt a pain underneath her right breast that has stayed with her since Friday. Hx of arthritis and neuropathy. No obvious redness/injury/swelling. Pt states it hurts more to take a deep breath, states she didn't feel any popping sensation when it happened.

## 2015-06-11 NOTE — ED Provider Notes (Addendum)
CSN: [id number]     Arrival date & time 06/11/15  [id number] History   First MD Initiated Contact with Patient 06/11/15 0830     Chief Complaint  Patient presents with  . Rib Injury     (Consider location/radiation/quality/duration/timing/severity/associated sxs/prior Treatment) Patient is a 82 y.o. female presenting with chest pain. The history is provided by the patient.  Chest Pain Pain location:  R chest Pain quality: sharp, shooting and stabbing   Pain radiates to:  Does not radiate Pain radiates to the back: no   Pain severity:  Severe Onset quality:  Sudden Duration:  2 days Timing:  Constant Progression:  Unchanged Chronicity:  New Context comment:  She was leaning over the washing machine to get the clothes and right ribs were pushing on the washing machine and since that time severe pain  Relieved by:  Nothing Worsened by:  Deep breathing, movement and certain positions Ineffective treatments: takes opana tid but not helping with pain. Associated symptoms: no abdominal pain, no cough, no dizziness, no fever, no lower extremity edema, no nausea, no palpitations, no shortness of breath, no syncope, not vomiting and no weakness   Risk factors: aortic disease, diabetes mellitus and hypertension   Risk factors: no coronary artery disease, no prior DVT/PE and no smoking     Past Medical History  Diagnosis Date  . Hypertension   . Rheumatoid arthritis(714.0)   . S/P carpal tunnel release   . Diabetes mellitus     pre-diabetes  . Diverticulosis   . Osteoarthritis   . Nephrolithiasis   . Osteoporosis   . Hyperlipidemia   . Dementia   . OSA on CPAP   . [name] (abdominal aortic aneurysm) ([place]) 09/2013    3.6 cm  . Spinal stenosis    Past Surgical History  Procedure Laterality Date  . Total knee arthroplasty  08/21/2007    Right total knee replacement  . Carpal tunnel release  12/27/2006    Right subcutaneous ulnar nerve transfer -- Right open carpal tunnel release  . Foot  surgery    . Cholecystectomy    . Appendectomy    . Joint replacement      BTKR, RTHR   Family History  Problem Relation Age of Onset  . Stroke Father   . Coronary artery disease Son     CABG at age 73  . Heart disease Son   . Heart attack Son   . Stroke Mother   . Hypertension Sister   . Diabetes Brother   . Hypertension Maternal Aunt    Social History  Substance Use Topics  . Smoking status: Never Smoker   . Smokeless tobacco: Never Used  . Alcohol Use: No   OB History    No data available     Review of Systems  Constitutional: Negative for fever.  Respiratory: Negative for cough and shortness of breath.   Cardiovascular: Positive for chest pain. Negative for palpitations and syncope.  Gastrointestinal: Negative for nausea, vomiting and abdominal pain.  Neurological: Negative for dizziness and weakness.  All other systems reviewed and are negative.     Allergies  Sulfonamide derivatives  Home Medications   Prior to Admission medications   Medication Sig Start Date End Date Taking? Authorizing Provider  aspirin EC 81 MG tablet Take 1 tablet (81 mg total) by mouth daily. 06/02/11   Lelon Perla, MD  atenolol (TENORMIN) 25 MG tablet Take 1 tablet (25 mg total) by mouth daily. 09/25/14  [name], MD  Calcium-Vitamin D 600-200 MG-UNIT per tablet Take 1 tablet by mouth 3 (three) times daily with meals.    Historical Provider, MD  clotrimazole (LOTRIMIN) 1 % cream Apply 1 application topically 2 (two) times daily. Apply to affected area twice daily. 03/17/15   Max T Hyatt, DPM  DULoxetine (CYMBALTA) 30 MG capsule Take 30 mg by mouth 3 (three) times daily.  07/29/14   Historical Provider, MD  gabapentin (NEURONTIN) 800 MG tablet TAKE 1 TABLET BY MOUTH EVERY MORNING AND AT LUNCH, 2 TABLETS BY MOUTH EVERY NIGHT AT BEDTIME 04/20/15   Max T Hyatt, DPM  hydrochlorothiazide (HYDRODIURIL) 25 MG tablet Take 1 tablet (25 mg total) by mouth daily. 09/25/14   [name], MD  Omega-3 Fatty Acids (FISH OIL) 1000 MG CAPS Take by mouth. 1 tab twice a day    Historical Provider, MD  oxymorphone (OPANA) 5 MG tablet Take 5 mg by mouth 3 (three) times daily.    Historical Provider, MD  simvastatin (ZOCOR) 40 MG tablet Take 1 tablet (40 mg total) by mouth at bedtime. 09/30/14   [name], MD   BP 132/81 mmHg  Pulse 79  Temp(Src) 97.8 F (36.6 C) (Oral)  Resp 17  SpO2 93% Physical Exam  Constitutional: She is oriented to person, place, and time. She appears well-developed and well-nourished. No distress.  HENT:  Head: Normocephalic and atraumatic.  Mouth/Throat: Oropharynx is clear and moist.  edentulous  Eyes: Conjunctivae and EOM are normal. Pupils are equal, round, and reactive to light.  Neck: Normal range of motion. Neck supple.  Cardiovascular: Normal rate, regular rhythm and intact distal pulses.   No murmur heard. Pulmonary/Chest: Effort normal and breath sounds normal. No respiratory distress. She has no wheezes. She has no rales. She exhibits tenderness.    Abdominal: Soft. She exhibits no distension. There is no tenderness. There is no rebound and no guarding.  Musculoskeletal: Normal range of motion. She exhibits no edema or tenderness.  Neurological: She is alert and oriented to person, place, and time.  Skin: Skin is warm and dry. No rash noted. No erythema.  Psychiatric: She has a normal mood and affect. Her behavior is normal.  Nursing note and vitals reviewed.   ED Course  Procedures (including critical care time) Labs Review Labs Reviewed - No data to display  Imaging Review Dg Ribs Unilateral W/chest Right  06/11/2015  CLINICAL DATA:  Anterior RIGHT rib pain for 1 week, leaned over washing machine to get clothes and thinks she may have leaned too hard, history hypertension, rheumatoid arthritis, initial encounter EXAM: RIGHT RIBS AND CHEST - 3+ VIEW COMPARISON:  Chest radiograph 05/27/2015 FINDINGS: Enlargement of cardiac  silhouette. Calcified tortuous thoracic aorta. Mediastinal contours and pulmonary vascularity otherwise normal. Lungs clear. No pleural effusion or pneumothorax. Diffuse osseous demineralization. BB placed at site of symptoms lower RIGHT chest. Essentially nondisplaced fractures of anterior RIGHT seventh and eighth ribs noted. IMPRESSION: Enlargement of cardiac silhouette. Essentially nondisplaced fractures of anterior RIGHT seventh and eighth ribs. Bony demineralization. Electronically Signed   By: Lavonia Dana M.D.   On: 06/11/2015 09:32   I have personally reviewed and evaluated these images and lab results as part of my medical decision-making.   EKG Interpretation None      MDM   Final diagnoses:  Rib fracture, right, closed, initial encounter    Patient complaining of pain in the right ribs after leaning over her washing machine 2 days  ago. Pain appears to be muscular in nature as it is worse with movement, deep breathing and palpation. She denies shortness of breath, fever, cough or abdominal pain. Low suspicion for PE, cardiac cause or abdominal pathology. No exam findings concerning for cholecystitis and pain is not related to eating.  Patient takes Opana 3 times a day for the last 1 year for chronic pain but has not taken her dose this morning. Patient given IM pain control and x-ray to evaluate for rib fracture pending.  9:58 AM Patient's x-rays consistent with rib fracture. Patient given a prescription for hydrocodone however instructed to call the pain management doctor first to ensure that they'll will not violate her pain contract. Also given incentive spirometry.   Blanchie Dessert, MD 06/11/15 [id number]  Blanchie Dessert, MD 06/11/15 1001

## 2015-07-13 NOTE — Progress Notes (Signed)
HPI: FU abnormal functional study. I intitially saw in January of 2013 for an abnormal electrocardiogram. She had documented dynamic anterior T-wave changes. Myoview was performed in January 2013 and showed mild reversible defect at the apex. This was felt to possibly represent ischemia. Ejection fraction was 77%. I reviewed and felt low risk. Treated medically. Small [name] followed by vascular surgery. Since I last saw her, She has mild dyspnea on exertion but no orthopnea, PND, pedal edema, chest pain or syncope.  Current Outpatient Prescriptions  Medication Sig Dispense Refill  . aspirin EC 81 MG tablet Take 1 tablet (81 mg total) by mouth daily. 150 tablet 2  . atenolol (TENORMIN) 25 MG tablet Take 1 tablet (25 mg total) by mouth daily. 90 tablet 3  . Calcium-Vitamin D 600-200 MG-UNIT per tablet Take 1 tablet by mouth 3 (three) times daily with meals. Reported on 06/11/2015    . clotrimazole (LOTRIMIN) 1 % cream Apply 1 application topically 2 (two) times daily. Apply to affected area twice daily. 30 g 0  . DULoxetine (CYMBALTA) 30 MG capsule Take 30 mg by mouth 3 (three) times daily.   2  . gabapentin (NEURONTIN) 800 MG tablet TAKE 1 TABLET BY MOUTH EVERY MORNING AND AT LUNCH, 2 TABLETS BY MOUTH EVERY NIGHT AT BEDTIME 360 tablet 0  . hydrochlorothiazide (HYDRODIURIL) 25 MG tablet Take 1 tablet (25 mg total) by mouth daily. 90 tablet 3  . Omega-3 Fatty Acids (FISH OIL) 1000 MG CAPS Take 1 capsule by mouth 2 (two) times daily.     Marland Kitchen oxymorphone (OPANA) 5 MG tablet Take 5 mg by mouth 3 (three) times daily.    . simvastatin (ZOCOR) 40 MG tablet Take 1 tablet (40 mg total) by mouth at bedtime. 90 tablet 3   No current facility-administered medications for this visit.     Past Medical History  Diagnosis Date  . Hypertension   . Rheumatoid arthritis(714.0)   . S/P carpal tunnel release   . Diabetes mellitus     pre-diabetes  . Diverticulosis   . Osteoarthritis   . Nephrolithiasis   .  Osteoporosis   . Hyperlipidemia   . Dementia   . OSA on CPAP   . [name] (abdominal aortic aneurysm) ([place]) 09/2013    3.6 cm  . Spinal stenosis     Past Surgical History  Procedure Laterality Date  . Total knee arthroplasty  08/21/2007    Right total knee replacement  . Carpal tunnel release  12/27/2006    Right subcutaneous ulnar nerve transfer -- Right open carpal tunnel release  . Foot surgery    . Cholecystectomy    . Appendectomy    . Joint replacement      BTKR, RTHR    Social History   Social History  . Marital Status: Divorced    Spouse Name: N/A  . Number of Children: 6  . Years of Education: N/A   Occupational History  . Not on file.   Social History Main Topics  . Smoking status: Never Smoker   . Smokeless tobacco: Never Used  . Alcohol Use: No  . Drug Use: No  . Sexual Activity: Not on file   Other Topics Concern  . Not on file   Social History Narrative    Family History  Problem Relation Age of Onset  . Stroke Father   . Coronary artery disease Son     CABG at age 45  . Heart disease Son   .  Heart attack Son   . Stroke Mother   . Hypertension Sister   . Diabetes Brother   . Hypertension Maternal Aunt     ROS: no fevers or chills, productive cough, hemoptysis, dysphasia, odynophagia, melena, hematochezia, dysuria, hematuria, rash, seizure activity, orthopnea, PND, pedal edema, claudication. Remaining systems are negative.  Physical Exam: Well-developed obese in no acute distress.  Skin is warm and dry.  HEENT is normal.  Neck is supple.  Chest is clear to auscultation with normal expansion.  Cardiovascular exam is regular rate and rhythm.  Abdominal exam nontender or distended. No masses palpated. Extremities show no edema. neuro grossly intact

## 2015-07-16 ENCOUNTER — Encounter: Payer: Self-pay | Admitting: Cardiology

## 2015-07-16 ENCOUNTER — Ambulatory Visit (INDEPENDENT_AMBULATORY_CARE_PROVIDER_SITE_OTHER): Payer: Medicare Other | Admitting: Cardiology

## 2015-07-16 VITALS — BP 138/76 | HR 96 | Ht 63.0 in | Wt 191.0 lb

## 2015-07-16 DIAGNOSIS — I10 Essential (primary) hypertension: Secondary | ICD-10-CM

## 2015-07-16 DIAGNOSIS — I714 Abdominal aortic aneurysm, without rupture, unspecified: Secondary | ICD-10-CM

## 2015-07-16 DIAGNOSIS — R9430 Abnormal result of cardiovascular function study, unspecified: Secondary | ICD-10-CM | POA: Diagnosis not present

## 2015-07-16 NOTE — Patient Instructions (Signed)
Your physician wants you to follow-up in: ONE YEAR WITH DR CRENSHAW You will receive a reminder letter in the mail two months in advance. If you don't receive a letter, please call our office to schedule the follow-up appointment.   If you need a refill on your cardiac medications before your next appointment, please call your pharmacy.  

## 2015-07-16 NOTE — Assessment & Plan Note (Signed)
Followed by vascular surgery. 

## 2015-07-16 NOTE — Assessment & Plan Note (Signed)
Continue statin. Lipids and liver monitored by primary care. 

## 2015-07-16 NOTE — Assessment & Plan Note (Signed)
Blood pressure controlled. Continue present medications. Potassium and renal function monitored by primary care. 

## 2015-07-16 NOTE — Assessment & Plan Note (Signed)
I reviewed the patient's nuclear study with she and her son previously. She is not having significant symptoms. I think her Myoview is low risk. It most likely represents shifting breast attenuation but mild distal anterior/apical ischemia cannot be excluded. Given that it is low risk and she is having no chest pain; plan medical therapy. Continue ASA and statin. If she develops symptoms in the future we could consider pursuing cardiac catheterization.

## 2015-07-18 ENCOUNTER — Other Ambulatory Visit: Payer: Self-pay | Admitting: Podiatry

## 2015-08-04 ENCOUNTER — Encounter: Payer: Self-pay | Admitting: Podiatry

## 2015-08-04 ENCOUNTER — Ambulatory Visit (INDEPENDENT_AMBULATORY_CARE_PROVIDER_SITE_OTHER): Payer: Medicare Other | Admitting: Podiatry

## 2015-08-04 DIAGNOSIS — Q828 Other specified congenital malformations of skin: Secondary | ICD-10-CM

## 2015-08-04 DIAGNOSIS — M79676 Pain in unspecified toe(s): Secondary | ICD-10-CM

## 2015-08-04 DIAGNOSIS — B351 Tinea unguium: Secondary | ICD-10-CM

## 2015-08-04 DIAGNOSIS — E1142 Type 2 diabetes mellitus with diabetic polyneuropathy: Secondary | ICD-10-CM | POA: Diagnosis not present

## 2015-08-04 NOTE — Progress Notes (Signed)
She presents today with chief complaint of painful elongated toenails painful calluses and numbness and tingling in her toes. She states that her diabetes is currently not well control.   Objective: vital signs are stable alert and oriented 3 pulses are palpable. Toenails are thick yellow dystrophic onychomycotic and painful on palpation. No open lesions or wounds. Porokeratosis are noted bilateral.   Assessment: Diabetes mellitus with diabetic peripheral neuropathy pain and limb secondary to onychomycosis and porokeratosis.  Plan: debridement of toenails 1 through 5 bilateral covered service secondary to pain.

## 2015-09-18 ENCOUNTER — Encounter: Payer: Self-pay | Admitting: Family Medicine

## 2015-09-18 ENCOUNTER — Other Ambulatory Visit: Payer: Self-pay | Admitting: Family Medicine

## 2015-09-18 ENCOUNTER — Other Ambulatory Visit: Payer: Self-pay | Admitting: Podiatry

## 2015-09-18 NOTE — Telephone Encounter (Signed)
Refill appropriate and filled per protocol. 

## 2015-09-18 NOTE — Telephone Encounter (Signed)
Medication refill for one time only.  Patient needs to be seen.  Letter sent for patient to call and schedule 

## 2015-10-01 ENCOUNTER — Ambulatory Visit
Admission: RE | Admit: 2015-10-01 | Discharge: 2015-10-01 | Disposition: A | Discharge status: Home / Self Care | Payer: Medicare Other | Source: Ambulatory Visit | Attending: Family Medicine | Admitting: Family Medicine

## 2015-10-01 ENCOUNTER — Encounter: Payer: Self-pay | Admitting: Family Medicine

## 2015-10-01 ENCOUNTER — Ambulatory Visit (INDEPENDENT_AMBULATORY_CARE_PROVIDER_SITE_OTHER): Payer: Medicare Other | Admitting: Family Medicine

## 2015-10-01 VITALS — BP 154/82 | HR 88 | Temp 100.0°F | Resp 16

## 2015-10-01 DIAGNOSIS — R0781 Pleurodynia: Secondary | ICD-10-CM

## 2015-10-01 DIAGNOSIS — R079 Chest pain, unspecified: Secondary | ICD-10-CM | POA: Diagnosis not present

## 2015-10-01 DIAGNOSIS — R109 Unspecified abdominal pain: Secondary | ICD-10-CM

## 2015-10-01 MED ORDER — HYDROMORPHONE HCL 2 MG PO TABS: 2.0000 mg | ORAL_TABLET | ORAL | Status: DC | PRN

## 2015-10-01 NOTE — Progress Notes (Signed)
Subjective:    Patient ID: [name], female    DOB: 06-Jun-1933, 82 y.o.   MRN: [id number]  HPI  Beginning this weekend, the patient developed severe pain in her right flank along the lower right ribs progressing anteriorly to below her right breast. The pain is worse with palpation. It is severe. She also reports pleurisy. She has a history of fractured seventh and eighth ribs in February of this year. The pain began after her dog jumped against her chest striking her this weekend. This is concerning for possible rib fracture. However she is now also running a low-grade fever and she denies any hematuria or dysuria. She is unable to give [place] a urine sample today. She also reports a throbbing headache all over her head with photophobia. This is coming and going and has been present now for several weeks. Past Medical History  Diagnosis Date  . Hypertension   . Rheumatoid arthritis(714.0)   . S/P carpal tunnel release   . Diabetes mellitus     pre-diabetes  . Diverticulosis   . Osteoarthritis   . Nephrolithiasis   . Osteoporosis   . Hyperlipidemia   . Dementia   . OSA on CPAP   . [name] (abdominal aortic aneurysm) ([place]) 09/2013    3.6 cm  . Spinal stenosis    Past Surgical History  Procedure Laterality Date  . Total knee arthroplasty  08/21/2007    Right total knee replacement  . Carpal tunnel release  12/27/2006    Right subcutaneous ulnar nerve transfer -- Right open carpal tunnel release  . Foot surgery    . Cholecystectomy    . Appendectomy    . Joint replacement      BTKR, RTHR   Current Outpatient Prescriptions on File Prior to Visit  Medication Sig Dispense Refill  . aspirin EC 81 MG tablet Take 1 tablet (81 mg total) by mouth daily. 150 tablet 2  . atenolol (TENORMIN) 25 MG tablet TAKE 1 TABLET BY MOUTH EVERY DAY 90 tablet 0  . Calcium-Vitamin D 600-200 MG-UNIT per tablet Take 1 tablet by mouth 3 (three) times daily with meals. Reported on 06/11/2015    . clotrimazole  (LOTRIMIN) 1 % cream Apply 1 application topically 2 (two) times daily. Apply to affected area twice daily. 30 g 0  . DULoxetine (CYMBALTA) 30 MG capsule Take 30 mg by mouth 3 (three) times daily.   2  . gabapentin (NEURONTIN) 800 MG tablet TAKE 1 TABLET BY MOUTH EVERY MORNING AND AT LUNCH, 2 TABLETS BY MOUTH EVERY NIGHT AT BEDTIME 360 tablet 0  . hydrochlorothiazide (HYDRODIURIL) 25 MG tablet TAKE 1 TABLET BY MOUTH EVERY DAY 90 tablet 0  . Omega-3 Fatty Acids (FISH OIL) 1000 MG CAPS Take 1 capsule by mouth 2 (two) times daily.     Marland Kitchen oxymorphone (OPANA) 5 MG tablet Take 5 mg by mouth 3 (three) times daily.    . simvastatin (ZOCOR) 40 MG tablet Take 1 tablet (40 mg total) by mouth at bedtime. 90 tablet 3   No current facility-administered medications on file prior to visit.   Allergies  Allergen Reactions  . Sulfonamide Derivatives Hives and Rash   Social History   Social History  . Marital Status: Divorced    Spouse Name: N/A  . Number of Children: 6  . Years of Education: N/A   Occupational History  . Not on file.   Social History Main Topics  . Smoking status: Never Smoker   .  Smokeless tobacco: Never Used  . Alcohol Use: No  . Drug Use: No  . Sexual Activity: Not on file   Other Topics Concern  . Not on file   Social History Narrative     Review of Systems  All other systems reviewed and are negative.      Objective:   Physical Exam  Constitutional: She appears well-developed and well-nourished. She appears distressed.  Neck: Neck supple. No JVD present.  Cardiovascular: Normal rate, regular rhythm and normal heart sounds.   Pulmonary/Chest: No respiratory distress. She has no wheezes. She has no rales. She exhibits tenderness.  Abdominal: Soft. Bowel sounds are normal. She exhibits no distension. There is tenderness.  Lymphadenopathy:    She has no cervical adenopathy.  Vitals reviewed.         Assessment & Plan:  Right flank pain - Plan: Urinalysis,  Routine w reflex microscopic (not at [facility]), DG Chest 2 View, HYDROmorphone (DILAUDID) 2 MG tablet  Rib pain  Fever in newborn  I suspect the patient has refractured the same ribs that she broke in February when the dog struck her in the chest. She is already on opana 5 mg tid.  She is in severe pain I will supplement this with Dilaudid 2 mg every 4-6 hours as needed for breakthrough pain. I will obtain a chest x-ray to rule out pneumonia given her low-grade fever. She is unable to give a urine sample to rule out a urinary tract infection but I believe the pain is more likely from a fracture rib given the fact I can reproduce the pain with palpation of the ribs in the right flank. I doubt a kidney stone as I do not believe that the pain will be reproducible with palpation and there were a kidney stone. Headaches could be migraines versus tension headaches. I believe aneurysm or other vascular abnormality is unlikely. If headache persists, I would proceed with intracranial imaging but her primary concern is the pain in her ribs and she would like to treat this first

## 2015-10-03 ENCOUNTER — Encounter (HOSPITAL_COMMUNITY): Payer: Self-pay | Admitting: Emergency Medicine

## 2015-10-03 ENCOUNTER — Emergency Department (HOSPITAL_COMMUNITY)
Admission: EM | Admit: 2015-10-03 | Discharge: 2015-10-04 | Disposition: A | Discharge status: Home / Self Care | Payer: Medicare Other | Attending: Emergency Medicine | Admitting: Emergency Medicine

## 2015-10-03 DIAGNOSIS — Z96651 Presence of right artificial knee joint: Secondary | ICD-10-CM | POA: Insufficient documentation

## 2015-10-03 DIAGNOSIS — Z7982 Long term (current) use of aspirin: Secondary | ICD-10-CM | POA: Diagnosis not present

## 2015-10-03 DIAGNOSIS — E785 Hyperlipidemia, unspecified: Secondary | ICD-10-CM | POA: Insufficient documentation

## 2015-10-03 DIAGNOSIS — I10 Essential (primary) hypertension: Secondary | ICD-10-CM | POA: Diagnosis not present

## 2015-10-03 DIAGNOSIS — M810 Age-related osteoporosis without current pathological fracture: Secondary | ICD-10-CM | POA: Insufficient documentation

## 2015-10-03 DIAGNOSIS — F0391 Unspecified dementia with behavioral disturbance: Secondary | ICD-10-CM | POA: Diagnosis not present

## 2015-10-03 DIAGNOSIS — B9689 Other specified bacterial agents as the cause of diseases classified elsewhere: Secondary | ICD-10-CM | POA: Diagnosis not present

## 2015-10-03 DIAGNOSIS — N760 Acute vaginitis: Secondary | ICD-10-CM | POA: Diagnosis not present

## 2015-10-03 DIAGNOSIS — R918 Other nonspecific abnormal finding of lung field: Secondary | ICD-10-CM | POA: Diagnosis not present

## 2015-10-03 DIAGNOSIS — K5900 Constipation, unspecified: Secondary | ICD-10-CM | POA: Insufficient documentation

## 2015-10-03 DIAGNOSIS — D72829 Elevated white blood cell count, unspecified: Secondary | ICD-10-CM | POA: Insufficient documentation

## 2015-10-03 DIAGNOSIS — R0789 Other chest pain: Secondary | ICD-10-CM | POA: Diagnosis not present

## 2015-10-03 DIAGNOSIS — R109 Unspecified abdominal pain: Secondary | ICD-10-CM | POA: Diagnosis present

## 2015-10-03 DIAGNOSIS — M069 Rheumatoid arthritis, unspecified: Secondary | ICD-10-CM | POA: Diagnosis not present

## 2015-10-03 DIAGNOSIS — R300 Dysuria: Secondary | ICD-10-CM | POA: Insufficient documentation

## 2015-10-03 LAB — CBC
HCT: 38.8 % (ref 36.0–46.0)
Hemoglobin: 12.6 g/dL (ref 12.0–15.0)
MCH: 30.3 pg (ref 26.0–34.0)
MCHC: 32.5 g/dL (ref 30.0–36.0)
MCV: 93.3 fL (ref 78.0–100.0)
Platelets: 200 10*3/uL (ref 150–400)
RBC: 4.16 MIL/uL (ref 3.87–5.11)
RDW: 13.7 % (ref 11.5–15.5)
WBC: 11.2 10*3/uL — ABNORMAL HIGH (ref 4.0–10.5)

## 2015-10-03 LAB — BASIC METABOLIC PANEL
Anion gap: 8 (ref 5–15)
BUN: 17 mg/dL (ref 6–20)
CO2: 29 mmol/L (ref 22–32)
Calcium: 8.5 mg/dL — ABNORMAL LOW (ref 8.9–10.3)
Chloride: 102 mmol/L (ref 101–111)
Creatinine, Ser: 0.81 mg/dL (ref 0.44–1.00)
GFR calc Af Amer: >60 mL/min (ref >60)
GFR calc non Af Amer: >60 mL/min (ref >60)
Glucose, Bld: 111 mg/dL — ABNORMAL HIGH (ref 65–99)
Potassium: 3.7 mmol/L (ref 3.5–5.1)
Sodium: 139 mmol/L (ref 135–145)

## 2015-10-03 LAB — URINALYSIS, ROUTINE W REFLEX MICROSCOPIC
Bilirubin Urine: NEGATIVE
Glucose, UA: NEGATIVE mg/dL
Hgb urine dipstick: NEGATIVE
Ketones, ur: NEGATIVE mg/dL
Leukocytes, UA: NEGATIVE
Nitrite: NEGATIVE
Protein, ur: NEGATIVE mg/dL
Specific Gravity, Urine: 1.02 (ref 1.005–1.030)
pH: 7 (ref 5.0–8.0)

## 2015-10-03 MED ORDER — ACETAMINOPHEN 325 MG PO TABS
650.0000 mg | ORAL_TABLET | Freq: Once | ORAL | Status: AC | PRN
  Administered 2015-10-03: 650 mg via ORAL
  Filled 2015-10-03: qty 2

## 2015-10-03 NOTE — ED Notes (Signed)
Pt from home with complaints of right flank pain and burning when she urinates x 1 week. Pt has a history of kidney stones, but is unable to tell if this is similar to that pain. Pt is also febrile upon assessment

## 2015-10-03 NOTE — ED Provider Notes (Signed)
CSN: [id number]     Arrival date & time 10/03/15  2005 History   First MD Initiated Contact with Patient 10/03/15 2230     Chief Complaint  Patient presents with  . Flank Pain    right      (Consider location/radiation/quality/duration/timing/severity/associated sxs/prior Treatment) HPI   Pt is a 82 y/o female with a history of HTN, RA, DM, OA, [name], on chronic pain meds who presents to the ED with right sided flank pain. Pt states the pain has been progressively getting worse over the last week. Pain is achy, constant at rest, non-radiating, worse with movement. Pain is sharp/stabbing with movement. Not associated with eating or drinking. Pt states burning with urination for greater than one week. Pt endorses constipation. She denies hematuria, abdominal pain, vaginal bleeding or discharge, nausea, vomiting, fevers, chills, vaginal discharge. Pt went to her PCP 4 days ago with the same complaints. They did a chest xray to rule out pneumonia. I reviewed her EMR and the chest xray was negative for any acute infiltrates or abnormalities. Pt's daughter states the pt was seen in February for number 7 and 8 right broken ribs. Her PCP believes she has rebroken her ribs.   Past Medical History  Diagnosis Date  . Hypertension   . Rheumatoid arthritis(714.0)   . S/P carpal tunnel release   . Diabetes mellitus     pre-diabetes  . Diverticulosis   . Osteoarthritis   . Nephrolithiasis   . Osteoporosis   . Hyperlipidemia   . Dementia   . OSA on CPAP   . [name] (abdominal aortic aneurysm) ([place]) 09/2013    3.6 cm  . Spinal stenosis    Past Surgical History  Procedure Laterality Date  . Total knee arthroplasty  08/21/2007    Right total knee replacement  . Carpal tunnel release  12/27/2006    Right subcutaneous ulnar nerve transfer -- Right open carpal tunnel release  . Foot surgery    . Cholecystectomy    . Appendectomy    . Joint replacement      BTKR, RTHR   Family History  Problem  Relation Age of Onset  . Stroke Father   . Coronary artery disease Son     CABG at age 44  . Heart disease Son   . Heart attack Son   . Stroke Mother   . Hypertension Sister   . Diabetes Brother   . Hypertension Maternal Aunt    Social History  Substance Use Topics  . Smoking status: Never Smoker   . Smokeless tobacco: Never Used  . Alcohol Use: No   OB History    No data available     Review of Systems  Constitutional: Negative for fever and chills.  Respiratory: Negative for cough, chest tightness and shortness of breath.   Cardiovascular: Negative for chest pain.  Gastrointestinal: Positive for constipation. Negative for nausea, vomiting, abdominal pain, blood in stool and abdominal distention.  Genitourinary: Positive for dysuria. Negative for hematuria, vaginal bleeding and vaginal discharge.  Musculoskeletal: Positive for back pain. Negative for neck pain.  Neurological: Negative for dizziness, syncope, weakness, numbness and headaches.  Psychiatric/Behavioral: Negative for confusion.      Allergies  Sulfonamide derivatives  Home Medications   Prior to Admission medications   Medication Sig Start Date End Date Taking? Authorizing Provider  aspirin EC 81 MG tablet Take 1 tablet (81 mg total) by mouth daily. 06/02/11   Lelon Perla, MD  atenolol (TENORMIN) 25  MG tablet TAKE 1 TABLET BY MOUTH EVERY DAY 09/18/15   [name], MD  Calcium-Vitamin D 600-200 MG-UNIT per tablet Take 1 tablet by mouth 3 (three) times daily with meals. Reported on 06/11/2015    Historical Provider, MD  clotrimazole (LOTRIMIN) 1 % cream Apply 1 application topically 2 (two) times daily. Apply to affected area twice daily. 03/17/15   Max T Hyatt, DPM  DULoxetine (CYMBALTA) 30 MG capsule Take 30 mg by mouth 3 (three) times daily.  07/29/14   Historical Provider, MD  gabapentin (NEURONTIN) 800 MG tablet TAKE 1 TABLET BY MOUTH EVERY MORNING AND AT LUNCH, 2 TABLETS BY MOUTH EVERY NIGHT AT  BEDTIME 09/18/15   Max T Hyatt, DPM  hydrochlorothiazide (HYDRODIURIL) 25 MG tablet TAKE 1 TABLET BY MOUTH EVERY DAY 09/18/15   [name], MD  HYDROmorphone (DILAUDID) 2 MG tablet Take 1 tablet (2 mg total) by mouth every 4 (four) hours as needed for severe pain. 10/01/15   [name], MD  Omega-3 Fatty Acids (FISH OIL) 1000 MG CAPS Take 1 capsule by mouth 2 (two) times daily.     Historical Provider, MD  oxymorphone (OPANA) 5 MG tablet Take 5 mg by mouth 3 (three) times daily.    Historical Provider, MD  simvastatin (ZOCOR) 40 MG tablet Take 1 tablet (40 mg total) by mouth at bedtime. 09/30/14   [name], MD   BP 117/69 mmHg  Pulse 80  Temp(Src) 101.3 F (38.5 C) (Oral)  Resp 20  Ht 5\' 5"  (1.651 m)  SpO2 92% Physical Exam  Constitutional: She appears well-developed and well-nourished. No distress.  HENT:  Head: Normocephalic and atraumatic.  Eyes: Conjunctivae are normal.  Neck: Normal range of motion.  Cardiovascular: Normal rate, regular rhythm and normal heart sounds.  Exam reveals no gallop and no friction rub.   No murmur heard. Pulses:      Radial pulses are 2+ on the right side, and 2+ on the left side.       Posterior tibial pulses are 2+ on the right side, and 2+ on the left side.  Pulmonary/Chest: Effort normal and breath sounds normal. No respiratory distress. She has no wheezes. She has no rales. She exhibits tenderness.    Pt TTP of the right side of ribs around ribs 7-10  Abdominal: Soft. Bowel sounds are normal. She exhibits no distension. There is no tenderness. There is no rebound, no guarding and no CVA tenderness.  Genitourinary:  Exam performed by Kalman Drape,  exam chaperoned Date: 10/04/2015 External genitalia exam: normal external genitalia without evidence of trauma. VULVA: normal appearing vulva with no masses, with irritated appearing tissue. Pt states "soreness" with touch. Wet prep and DNA probe for chlamydia and GC obtained.       Musculoskeletal: Normal range of motion.  Neurological: She is alert. Coordination normal.  Skin: Skin is warm and dry. No rash noted. She is not diaphoretic.  Psychiatric: She has a normal mood and affect. Her behavior is normal.  Nursing note and vitals reviewed.   ED Course  Procedures (including critical care time) Labs Review Labs Reviewed  WET PREP, GENITAL - Abnormal; Notable for the following:    Clue Cells Wet Prep HPF POC PRESENT (*)    WBC, Wet Prep HPF POC FEW (*)    All other components within normal limits  CBC - Abnormal; Notable for the following:    WBC 11.2 (*)    All other components  within normal limits  BASIC METABOLIC PANEL - Abnormal; Notable for the following:    Glucose, Bld 111 (*)    Calcium 8.5 (*)    All other components within normal limits  URINALYSIS, ROUTINE W REFLEX MICROSCOPIC (NOT AT [facility])  GC/CHLAMYDIA PROBE AMP ([place]) NOT AT [facility]    Imaging Review Dg Chest 2 View  10/04/2015  CLINICAL DATA:  Acute onset of right flank pain and burning during urination. Initial encounter. EXAM: CHEST  2 VIEW COMPARISON:  Chest radiograph performed 10/01/2015 FINDINGS: The lungs are well-aerated. Minimal left basilar opacity likely reflects atelectasis. There is no evidence of pleural effusion or pneumothorax. The heart is borderline enlarged. No acute osseous abnormalities are seen. IMPRESSION: Minimal left basilar opacity likely reflects atelectasis. Borderline cardiomegaly. Electronically Signed   By: Garald Balding M.D.   On: 10/04/2015 01:52   I have personally reviewed and evaluated these images and lab results as part of my medical decision-making.   EKG Interpretation None      MDM   Final diagnoses:  Right-sided chest wall pain   Patient with reproducible chest wall pain on exam. Patient has a history of broken ribs on the right side back in February of this year. I reviewed her EMR. Patient seen by her PCP 2 days ago believes her ribs are  maybe rebroken. They did a chest xray which was negative for infiltrates. Patient had no abdominal tenderness and no urinary symptoms. UA revealed no UTI. Mild leukocytosis. All other labs unremarkable. Patient states she has had an intermittent cough. We will repeat chest x-ray. Patient had a fever on arrival to the ED of 101.3. She was given 650 mg of Tylenol in triage. Reexamination of her temperature at midnight revealed she was afebrile. Chest xray revealed no effusions or signs of infection. Her chest wall pain is likely secondary to her previously broken ribs as she states the pain is similar.   Patient complaining of burning of her labia with urination. Wet prep and GC chlamydia were done. Wet prep revealed clue cells and a few white blood cells. Will treat for BV and suggest f/u with PCP or GYN.  Patient stable at time of discharge. Discussed strict return precautions. Instructed patient to follow up with her primary care provider on Monday. I discussed all of the results with the patient and family members they have expressed their understanding to the verbal discharge instructions.    Kalman Drape, [place] 10/04/15 [id number]  Orlie Dakin, MD 10/04/15 [phone]

## 2015-10-04 ENCOUNTER — Emergency Department (HOSPITAL_COMMUNITY): Payer: Medicare Other

## 2015-10-04 DIAGNOSIS — R918 Other nonspecific abnormal finding of lung field: Secondary | ICD-10-CM | POA: Diagnosis not present

## 2015-10-04 DIAGNOSIS — R0789 Other chest pain: Secondary | ICD-10-CM | POA: Diagnosis not present

## 2015-10-04 LAB — WET PREP, GENITAL
Sperm: NONE SEEN
Trich, Wet Prep: NONE SEEN
Yeast Wet Prep HPF POC: NONE SEEN

## 2015-10-04 MED ORDER — METRONIDAZOLE 500 MG PO TABS: 500.0000 mg | ORAL_TABLET | Freq: Two times a day (BID) | ORAL | Status: DC

## 2015-10-04 NOTE — Discharge Instructions (Signed)
Follow-up with your primary care provider on Monday to be reevaluated for your right rib pain.  You have been given Flagyl, an antibiotic, to treat your bacterial vaginosis. Do not drink alcohol while taking this antibiotic. Follow-up with your primary care provider on Monday regarding your vaginal symptoms.  Return to the emergency department if you experience fevers, increased cough, chest pain, shortness of breath.   Chest Wall Pain Chest wall pain is pain in or around the bones and muscles of your chest. Sometimes, an injury causes this pain. Sometimes, the cause may not be known. This pain may take several weeks or longer to get better. HOME CARE INSTRUCTIONS  Pay attention to any changes in your symptoms. Take these actions to help with your pain:   Rest as told by your health care provider.   Avoid activities that cause pain. These include any activities that use your chest muscles or your abdominal and side muscles to lift heavy items.   If directed, apply ice to the painful area:  Put ice in a plastic bag.  Place a towel between your skin and the bag.  Leave the ice on for 20 minutes, 2-3 times per day.  Take over-the-counter and prescription medicines only as told by your health care provider.  Do not use tobacco products, including cigarettes, chewing tobacco, and e-cigarettes. If you need help quitting, ask your health care provider.  Keep all follow-up visits as told by your health care provider. This is important. SEEK MEDICAL CARE IF:  You have a fever.  Your chest pain becomes worse.  You have new symptoms. SEEK IMMEDIATE MEDICAL CARE IF:  You have nausea or vomiting.  You feel sweaty or light-headed.  You have a cough with phlegm (sputum) or you cough up blood.  You develop shortness of breath.   This information is not intended to replace advice given to you by your health care provider. Make sure you discuss any questions you have with your health  care provider.   Document Released: 04/18/2005 Document Revised: 01/07/2015 Document Reviewed: 07/14/2014 Elsevier Interactive Patient Education [employer].

## 2015-10-04 NOTE — ED Provider Notes (Signed)
Complains of right lateral rib pain for 2 weeks. Treated with oxymorphone and hydromorphone with transient relief. She denies abdominal pain denies nausea or vomiting. No urinary symptoms. On exam alert no distress lungs clear auscultation right lateral chest wall is exquisitely tender. Skin warm dry abdomen obese soft nontender  Orlie Dakin, MD 10/04/15 [phone]

## 2015-10-05 ENCOUNTER — Telehealth: Payer: Self-pay | Admitting: *Deleted

## 2015-10-05 LAB — GC/CHLAMYDIA PROBE AMP (~~LOC~~) NOT AT ARMC
Chlamydia: NEGATIVE
Neisseria Gonorrhea: NEGATIVE

## 2015-10-05 NOTE — Telephone Encounter (Signed)
Pt left name, DOB and [phone].  Pt states she needed an appt but had one for tomorrow she didn't know.

## 2015-10-06 ENCOUNTER — Encounter: Payer: Self-pay | Admitting: Podiatry

## 2015-10-06 ENCOUNTER — Ambulatory Visit (INDEPENDENT_AMBULATORY_CARE_PROVIDER_SITE_OTHER): Payer: Medicare Other | Admitting: Podiatry

## 2015-10-06 DIAGNOSIS — M79676 Pain in unspecified toe(s): Secondary | ICD-10-CM

## 2015-10-06 DIAGNOSIS — Q828 Other specified congenital malformations of skin: Secondary | ICD-10-CM | POA: Diagnosis not present

## 2015-10-06 DIAGNOSIS — B351 Tinea unguium: Secondary | ICD-10-CM | POA: Diagnosis not present

## 2015-10-06 DIAGNOSIS — E1142 Type 2 diabetes mellitus with diabetic polyneuropathy: Secondary | ICD-10-CM

## 2015-10-06 MED ORDER — GABAPENTIN 800 MG PO TABS: ORAL_TABLET | ORAL | Status: DC

## 2015-10-06 NOTE — Progress Notes (Signed)
She presents today with chief complaint of painful elongated toenails bilaterally. She also needs a refill on her gabapentin for her neuropathy.  Objective: Vital signs are stable alert and oriented 3. Pulses are strongly palpable. Neurologic sensorium is severely diminished. Deep tendon reflexes are intact. Muscle strength +5 over 5 dorsiflexion plantar flexors and inverters and everters all intrinsic musculature is intact. Toenails are thick yellow dystrophic length mycotic.  Assessment: Diabetes with diabetic peripheral neuropathy. Pain in limb secondary to onychomycosis.  Plan: Debridement of toenails 1 through 5 bilateral. I refilled her gabapentin 800 mg 1 by mouth morning to lunch and 1 at bedtime.

## 2015-10-18 ENCOUNTER — Other Ambulatory Visit: Payer: Self-pay | Admitting: Family Medicine

## 2015-10-20 NOTE — Telephone Encounter (Signed)
Refill appropriate and filled per protocol. 

## 2015-10-22 DIAGNOSIS — L570 Actinic keratosis: Secondary | ICD-10-CM | POA: Diagnosis not present

## 2015-10-22 DIAGNOSIS — L814 Other melanin hyperpigmentation: Secondary | ICD-10-CM | POA: Diagnosis not present

## 2015-10-22 DIAGNOSIS — D225 Melanocytic nevi of trunk: Secondary | ICD-10-CM | POA: Diagnosis not present

## 2015-10-22 DIAGNOSIS — L821 Other seborrheic keratosis: Secondary | ICD-10-CM | POA: Diagnosis not present

## 2015-10-22 DIAGNOSIS — L820 Inflamed seborrheic keratosis: Secondary | ICD-10-CM | POA: Diagnosis not present

## 2015-10-22 DIAGNOSIS — Z85828 Personal history of other malignant neoplasm of skin: Secondary | ICD-10-CM | POA: Diagnosis not present

## 2015-10-22 DIAGNOSIS — D2239 Melanocytic nevi of other parts of face: Secondary | ICD-10-CM | POA: Diagnosis not present

## 2015-11-18 ENCOUNTER — Other Ambulatory Visit: Payer: Self-pay | Admitting: Family Medicine

## 2015-12-30 DIAGNOSIS — M461 Sacroiliitis, not elsewhere classified: Secondary | ICD-10-CM | POA: Diagnosis not present

## 2016-01-07 ENCOUNTER — Ambulatory Visit (INDEPENDENT_AMBULATORY_CARE_PROVIDER_SITE_OTHER): Payer: Medicare Other | Admitting: Podiatry

## 2016-01-07 ENCOUNTER — Encounter: Payer: Self-pay | Admitting: Podiatry

## 2016-01-07 DIAGNOSIS — Q828 Other specified congenital malformations of skin: Secondary | ICD-10-CM | POA: Diagnosis not present

## 2016-01-07 DIAGNOSIS — M79676 Pain in unspecified toe(s): Secondary | ICD-10-CM | POA: Diagnosis not present

## 2016-01-07 DIAGNOSIS — E1142 Type 2 diabetes mellitus with diabetic polyneuropathy: Secondary | ICD-10-CM

## 2016-01-07 DIAGNOSIS — B351 Tinea unguium: Secondary | ICD-10-CM | POA: Diagnosis not present

## 2016-01-07 NOTE — Progress Notes (Signed)
She presents today with chief complaint of painful elongated toenails bilateral and corns and calluses. States that she has not been feeling good lately probably partially did a lot of shopping. She also states that her blood sugars been somewhat abnormal recently.  Objective: Vital signs are stable alert and oriented 3 pulses are palpable. Neurologic sensorium is diminished persons once the monofilament. Toenails are thick yellow dystrophic clinically mycotic.  Assessment: Pain limb secondary to onychomycosis diabetic peripheral neuropathy and porokeratosis.  Plan: Debridement of toenails and calluses bilateral. Follow up with her in 3 months

## 2016-01-12 ENCOUNTER — Other Ambulatory Visit: Payer: Self-pay | Admitting: *Deleted

## 2016-01-12 MED ORDER — GABAPENTIN 800 MG PO TABS: ORAL_TABLET | ORAL | 4 refills | Status: DC

## 2016-02-15 ENCOUNTER — Other Ambulatory Visit: Payer: Self-pay | Admitting: Family Medicine

## 2016-03-08 ENCOUNTER — Other Ambulatory Visit: Payer: Self-pay | Admitting: Family Medicine

## 2016-03-30 DIAGNOSIS — I10 Essential (primary) hypertension: Secondary | ICD-10-CM | POA: Diagnosis not present

## 2016-03-30 DIAGNOSIS — M47816 Spondylosis without myelopathy or radiculopathy, lumbar region: Secondary | ICD-10-CM | POA: Diagnosis not present

## 2016-03-30 DIAGNOSIS — M461 Sacroiliitis, not elsewhere classified: Secondary | ICD-10-CM | POA: Diagnosis not present

## 2016-04-07 ENCOUNTER — Ambulatory Visit (INDEPENDENT_AMBULATORY_CARE_PROVIDER_SITE_OTHER): Payer: Medicare Other | Admitting: Podiatry

## 2016-04-07 ENCOUNTER — Encounter: Payer: Self-pay | Admitting: Podiatry

## 2016-04-07 DIAGNOSIS — E1142 Type 2 diabetes mellitus with diabetic polyneuropathy: Secondary | ICD-10-CM | POA: Diagnosis not present

## 2016-04-07 DIAGNOSIS — M79676 Pain in unspecified toe(s): Secondary | ICD-10-CM

## 2016-04-07 DIAGNOSIS — B351 Tinea unguium: Secondary | ICD-10-CM | POA: Diagnosis not present

## 2016-04-07 DIAGNOSIS — Q828 Other specified congenital malformations of skin: Secondary | ICD-10-CM | POA: Diagnosis not present

## 2016-04-07 DIAGNOSIS — M7750 Other enthesopathy of unspecified foot: Secondary | ICD-10-CM | POA: Diagnosis not present

## 2016-04-10 NOTE — Progress Notes (Signed)
She presents today with a chief complaint of pain to her right ankle. Along the tibialis anterior tendon. She states it is been sore for the past few weeks. She also presents for her diabetic evaluation nail trim and debridement of her calluses.  Objective: Vital signs are stable she is alert and oriented 3 presents with her son today toenails are thick yellow dystrophic onychomycotic multiple porokeratotic lesions and high locus plantarly. No open lesions or wounds are noted. She has tenderness on palpation of the tibialis anterior right with some fluid retention along the distalmost aspect of the tendon but the tendon itself appears to be firm and intact without nodularity.  Assessment: Pain in limb secondary to onychomycosis and porokeratosis and diabetic peripheral neuropathy. She also has tendinitis of the distalmost aspect of the tibialis anterior tendon right foot.  Plan: Discussed etiology pathology conservative surgical therapies. Injected a small amount of dexamethasone along the tendon sheath which alleviated her symptoms almost immediately. Debrided all of her nails and calluses will follow up with her in 3 months. She will call sooner if needed

## 2016-04-15 ENCOUNTER — Other Ambulatory Visit: Payer: Self-pay | Admitting: Family Medicine

## 2016-04-15 MED ORDER — ATENOLOL 25 MG PO TABS: 25.0000 mg | ORAL_TABLET | Freq: Every day | ORAL | 0 refills | Status: DC

## 2016-04-15 NOTE — Telephone Encounter (Signed)
Medication called/sent to requested pharmacy  

## 2016-06-17 ENCOUNTER — Encounter: Payer: Self-pay | Admitting: Family Medicine

## 2016-06-17 ENCOUNTER — Ambulatory Visit
Admission: RE | Admit: 2016-06-17 | Discharge: 2016-06-17 | Disposition: A | Discharge status: Home / Self Care | Payer: Medicare Other | Source: Ambulatory Visit | Attending: Family Medicine | Admitting: Family Medicine

## 2016-06-17 ENCOUNTER — Ambulatory Visit (INDEPENDENT_AMBULATORY_CARE_PROVIDER_SITE_OTHER): Payer: Medicare Other | Admitting: Family Medicine

## 2016-06-17 VITALS — BP 130/86 | HR 70 | Temp 98.3°F | Resp 20 | Ht 60.0 in | Wt 184.0 lb

## 2016-06-17 DIAGNOSIS — Z23 Encounter for immunization: Secondary | ICD-10-CM | POA: Diagnosis not present

## 2016-06-17 DIAGNOSIS — Z0000 Encounter for general adult medical examination without abnormal findings: Secondary | ICD-10-CM | POA: Diagnosis not present

## 2016-06-17 DIAGNOSIS — Z780 Asymptomatic menopausal state: Secondary | ICD-10-CM | POA: Diagnosis not present

## 2016-06-17 DIAGNOSIS — Z1231 Encounter for screening mammogram for malignant neoplasm of breast: Secondary | ICD-10-CM

## 2016-06-17 DIAGNOSIS — E7800 Pure hypercholesterolemia, unspecified: Secondary | ICD-10-CM | POA: Diagnosis not present

## 2016-06-17 DIAGNOSIS — R079 Chest pain, unspecified: Secondary | ICD-10-CM | POA: Diagnosis not present

## 2016-06-17 DIAGNOSIS — Z1239 Encounter for other screening for malignant neoplasm of breast: Secondary | ICD-10-CM

## 2016-06-17 DIAGNOSIS — R0989 Other specified symptoms and signs involving the circulatory and respiratory systems: Secondary | ICD-10-CM | POA: Diagnosis not present

## 2016-06-17 LAB — CBC WITH DIFFERENTIAL/PLATELET
Basophils Absolute: 74 cells/uL (ref 0–200)
Basophils Relative: 1 %
Eosinophils Absolute: 296 cells/uL (ref 15–500)
Eosinophils Relative: 4 %
HCT: 39.2 % (ref 35.0–45.0)
Hemoglobin: 12.8 g/dL (ref 12.0–15.0)
Lymphocytes Relative: 40 %
Lymphs Abs: 2960 cells/uL (ref 850–3900)
MCH: 30.1 pg (ref 27.0–33.0)
MCHC: 32.7 g/dL (ref 32.0–36.0)
MCV: 92.2 fL (ref 80.0–100.0)
MPV: 9.8 fL (ref 7.5–12.5)
Monocytes Absolute: 518 cells/uL (ref 200–950)
Monocytes Relative: 7 %
Neutro Abs: 3552 cells/uL (ref 1500–7800)
Neutrophils Relative %: 48 %
Platelets: 183 10*3/uL (ref 140–400)
RBC: 4.25 MIL/uL (ref 3.80–5.10)
RDW: 13.9 % (ref 11.0–15.0)
WBC: 7.4 10*3/uL (ref 3.8–10.8)

## 2016-06-17 LAB — COMPLETE METABOLIC PANEL WITH GFR
ALT: 17 U/L (ref 6–29)
AST: 21 U/L (ref 10–35)
Albumin: 3.7 g/dL (ref 3.6–5.1)
Alkaline Phosphatase: 42 U/L (ref 33–130)
BUN: 14 mg/dL (ref 7–25)
CO2: 25 mmol/L (ref 20–31)
Calcium: 9.5 mg/dL (ref 8.6–10.4)
Chloride: 102 mmol/L (ref 98–110)
Creat: 0.8 mg/dL (ref 0.60–0.88)
GFR, Est African American: 81 mL/min (ref >=60)
GFR, Est Non African American: 70 mL/min (ref >=60)
Glucose, Bld: 88 mg/dL (ref 70–99)
Potassium: 4.1 mmol/L (ref 3.5–5.3)
Sodium: 140 mmol/L (ref 135–146)
Total Bilirubin: 0.6 mg/dL (ref 0.2–1.2)
Total Protein: 6.9 g/dL (ref 6.1–8.1)

## 2016-06-17 LAB — LIPID PANEL
Cholesterol: 181 mg/dL (ref <200)
HDL: 39 mg/dL — ABNORMAL LOW (ref >50)
LDL Cholesterol: 116 mg/dL — ABNORMAL HIGH (ref <100)
Total CHOL/HDL Ratio: 4.6 Ratio (ref <5.0)
Triglycerides: 128 mg/dL (ref <150)
VLDL: 26 mg/dL (ref <30)

## 2016-06-17 NOTE — Progress Notes (Signed)
Subjective:    Patient ID: [name], female    DOB: 1933-07-02, 83 y.o.   MRN: [id number]  HPI  Patient is here today for complete physical exam. Because of her age, she is not a candidate for a Pap smear nor colonoscopy. However she would still like to get a mammogram. She is also overdue for a bone density test. Her immunizations are up-to-date except for the flu shot: Immunization History  Administered Date(s) Administered  . Influenza Whole 02/17/2005, 03/15/2007, 02/21/2008  . Influenza,inj,Quad PF,36+ Mos 01/11/2013  . Influenza-Unspecified 02/06/2012  . Pneumococcal Conjugate-13 08/23/2014  . Pneumococcal Polysaccharide-23 07/23/2013  . Zoster 08/23/2014   She also reports left lower flank pain. She is tender to palpation on the lower left ribs posterior to the axillary line. She denies any CVA tenderness. She denies any dysuria. She denies any hematuria although she has been coughing recently and on examination today she does have pronounced left basilar crackles. She denies any shortness of breath. Past Medical History:  Diagnosis Date  . [name] (abdominal aortic aneurysm) ([place]) 09/2013   3.6 cm  . Dementia   . Diabetes mellitus    pre-diabetes  . Diverticulosis   . Hyperlipidemia   . Hypertension   . Nephrolithiasis   . OSA on CPAP   . Osteoarthritis   . Osteoporosis   . Rheumatoid arthritis(714.0)   . S/P carpal tunnel release   . Spinal stenosis    Past Surgical History:  Procedure Laterality Date  . APPENDECTOMY    . CARPAL TUNNEL RELEASE  12/27/2006   Right subcutaneous ulnar nerve transfer -- Right open carpal tunnel release  . CHOLECYSTECTOMY    . FOOT SURGERY    . JOINT REPLACEMENT     BTKR, RTHR  . TOTAL KNEE ARTHROPLASTY  08/21/2007   Right total knee replacement   Current Outpatient Prescriptions on File Prior to Visit  Medication Sig Dispense Refill  . aspirin EC 81 MG tablet Take 1 tablet (81 mg total) by mouth daily. 150 tablet 2  . atenolol  (TENORMIN) 25 MG tablet TAKE 1 TABLET(25 MG) BY MOUTH DAILY 90 tablet 0  . Calcium-Vitamin D 600-200 MG-UNIT per tablet Take 1 tablet by mouth 3 (three) times daily with meals. Reported on 06/11/2015    . clotrimazole (LOTRIMIN) 1 % cream Apply 1 application topically 2 (two) times daily. Apply to affected area twice daily. 30 g 0  . DULoxetine (CYMBALTA) 30 MG capsule Take 30 mg by mouth 3 (three) times daily.   2  . gabapentin (NEURONTIN) 800 MG tablet TAKE 1 TABLET BY MOUTH EVERY MORNING AND AT LUNCH, 2 TABLETS BY MOUTH EVERY NIGHT AT BEDTIME 360 tablet 4  . hydrochlorothiazide (HYDRODIURIL) 25 MG tablet TAKE 1 TABLET BY MOUTH EVERY DAY 30 tablet 11  . Omega-3 Fatty Acids (FISH OIL) 1000 MG CAPS Take 1 capsule by mouth 2 (two) times daily.     Marland Kitchen oxymorphone (OPANA) 5 MG tablet Take 5 mg by mouth 3 (three) times daily.    . simvastatin (ZOCOR) 40 MG tablet Take 1 tablet (40 mg total) by mouth at bedtime. 90 tablet 3   No current facility-administered medications on file prior to visit.    Allergies  Allergen Reactions  . Sulfonamide Derivatives Hives and Rash   Social History   Social History  . Marital status: Divorced    Spouse name: N/A  . Number of children: 6  . Years of education: N/A   Occupational  History  . Not on file.   Social History Main Topics  . Smoking status: Never Smoker  . Smokeless tobacco: Never Used  . Alcohol use No  . Drug use: No  . Sexual activity: Not on file   Other Topics Concern  . Not on file   Social History Narrative  . No narrative on file   Family History  Problem Relation Age of Onset  . Stroke Father   . Coronary artery disease Son     CABG at age 39  . Heart disease Son   . Heart attack Son   . Stroke Mother   . Hypertension Sister   . Diabetes Brother   . Hypertension Maternal Aunt       Review of Systems  All other systems reviewed and are negative.      Objective:   Physical Exam  Constitutional: She is oriented to  person, place, and time. She appears well-developed and well-nourished. No distress.  HENT:  Head: Normocephalic and atraumatic.  Right Ear: External ear normal.  Left Ear: External ear normal.  Nose: Nose normal.  Mouth/Throat: Oropharynx is clear and moist. No oropharyngeal exudate.  Eyes: Conjunctivae and EOM are normal. Pupils are equal, round, and reactive to light. Right eye exhibits no discharge. Left eye exhibits no discharge. No scleral icterus.  Neck: Normal range of motion. Neck supple. No JVD present. No tracheal deviation present. No thyromegaly present.  Cardiovascular: Normal rate, regular rhythm, normal heart sounds and intact distal pulses.  Exam reveals no gallop and no friction rub.   No murmur heard. Pulmonary/Chest: Effort normal. No stridor. No respiratory distress. She has no wheezes. She has rales. She exhibits tenderness.  Abdominal: Soft. Bowel sounds are normal. She exhibits no distension and no mass. There is no tenderness. There is no rebound and no guarding.  Musculoskeletal: Normal range of motion. She exhibits no edema, tenderness or deformity.  Lymphadenopathy:    She has no cervical adenopathy.  Neurological: She is alert and oriented to person, place, and time. She has normal reflexes. She displays normal reflexes. No cranial nerve deficit. She exhibits normal muscle tone. Coordination normal.  Skin: Skin is warm. No rash noted. She is not diaphoretic. No erythema. No pallor.  Psychiatric: She has a normal mood and affect. Her behavior is normal. Judgment and thought content normal.  Vitals reviewed.         Assessment & Plan:  Pure hypercholesterolemia - Plan: CBC with Differential/Platelet, COMPLETE METABOLIC PANEL WITH GFR, Lipid panel  Abnormal pulmonary finding - Plan: DG Chest 2 View  General medical exam  Physical exam is only significant for the left lower lobe posterior crackles and also the tenderness to palpation on her lower left ribs.  Given the fact the patient's also complaining of pain in that area, I will send the patient for a chest x-ray today. Further treatment decisions pending the results of the chest x-ray. Also schedule her for mammogram screening for breast cancer as well as a bone density test. I did not recommend a colonoscopy. I did not recommend a Pap smear due to her age. I will also check a CBC, CMP, fasting lipid panel

## 2016-06-17 NOTE — Addendum Note (Signed)
Addended by: Shary Decamp B on: 06/17/2016 04:44 PM   Modules accepted: Orders

## 2016-06-22 ENCOUNTER — Encounter: Payer: Self-pay | Admitting: Family Medicine

## 2016-06-22 ENCOUNTER — Encounter: Payer: Self-pay | Admitting: Vascular Surgery

## 2016-06-29 ENCOUNTER — Ambulatory Visit (INDEPENDENT_AMBULATORY_CARE_PROVIDER_SITE_OTHER): Payer: Medicare Other | Admitting: Family

## 2016-06-29 ENCOUNTER — Encounter: Payer: Self-pay | Admitting: Family

## 2016-06-29 ENCOUNTER — Ambulatory Visit (HOSPITAL_COMMUNITY)
Admission: RE | Admit: 2016-06-29 | Discharge: 2016-06-29 | Disposition: A | Discharge status: Home / Self Care | Payer: Medicare Other | Source: Ambulatory Visit | Attending: Vascular Surgery | Admitting: Vascular Surgery

## 2016-06-29 VITALS — BP 123/77 | HR 72 | Temp 98.2°F | Resp 16 | Ht 61.0 in | Wt 179.0 lb

## 2016-06-29 DIAGNOSIS — I714 Abdominal aortic aneurysm, without rupture, unspecified: Secondary | ICD-10-CM

## 2016-06-29 DIAGNOSIS — Z7722 Contact with and (suspected) exposure to environmental tobacco smoke (acute) (chronic): Secondary | ICD-10-CM

## 2016-06-29 NOTE — Progress Notes (Signed)
[facility] [place]   CC: Follow up Abdominal Aortic Aneurysm  History of Present Illness  [name] is a 83 y.o. (21-Apr-1934) female whom Dr. Scot Dock has been monitoring for a small 3.4 cm infrarenal abdominal aortic aneurysm. She is not a smoker. She returns for a follow up duplex scan.  She denies any history of abdominal pain. She has known back problems, states her pain management doctor gives her shots in her back which helps, Dr. Brien Few.  She denies any new back pain. She states that she has painful "arthritis everywhere".  She does not seem to have claudication symptoms with walking.   She is not a smoker. Her blood pressure has been well-controlled.  . The patient denies history of stroke or [name] symptoms.  Pt denies dyspnea, denies cough.   Pt Diabetic: No Pt smoker: non-smoker, but her son smokes in the house   Past Medical History:  Diagnosis Date  . [name] (abdominal aortic aneurysm) ([place]) 09/2013   3.6 cm  . Dementia   . Diabetes mellitus    pre-diabetes  . Diverticulosis   . Hyperlipidemia   . Hypertension   . Nephrolithiasis   . OSA on CPAP   . Osteoarthritis   . Osteoporosis   . Rheumatoid arthritis(714.0)   . S/P carpal tunnel release   . Spinal stenosis    Past Surgical History:  Procedure Laterality Date  . APPENDECTOMY    . CARPAL TUNNEL RELEASE  12/27/2006   Right subcutaneous ulnar nerve transfer -- Right open carpal tunnel release  . CHOLECYSTECTOMY    . FOOT SURGERY    . JOINT REPLACEMENT     BTKR, RTHR  . TOTAL KNEE ARTHROPLASTY  08/21/2007   Right total knee replacement   Social History Social History   Social History  . Marital status: Divorced    Spouse name: N/A  . Number of children: 6  . Years of education: N/A   Occupational History  . Not on file.   Social History Main Topics  . Smoking status: Never Smoker  . Smokeless tobacco: Never Used  . Alcohol use No  . Drug use: No  . Sexual  activity: Not on file   Other Topics Concern  . Not on file   Social History Narrative  . No narrative on file   Family History Family History  Problem Relation Age of Onset  . Stroke Father   . Coronary artery disease Son     CABG at age 6  . Heart disease Son   . Heart attack Son   . Stroke Mother   . Hypertension Sister   . Diabetes Brother   . Hypertension Maternal Aunt     Current Outpatient Prescriptions on File Prior to Visit  Medication Sig Dispense Refill  . aspirin EC 81 MG tablet Take 1 tablet (81 mg total) by mouth daily. 150 tablet 2  . atenolol (TENORMIN) 25 MG tablet TAKE 1 TABLET(25 MG) BY MOUTH DAILY 90 tablet 0  . Calcium-Vitamin D 600-200 MG-UNIT per tablet Take 1 tablet by mouth 3 (three) times daily with meals. Reported on 06/11/2015    . clotrimazole (LOTRIMIN) 1 % cream Apply 1 application topically 2 (two) times daily. Apply to affected area twice daily. 30 g 0  . DULoxetine (CYMBALTA) 30 MG capsule Take 30 mg by mouth 3 (three) times daily.   2  . gabapentin (NEURONTIN) 800 MG tablet TAKE 1 TABLET BY MOUTH EVERY MORNING AND  AT LUNCH, 2 TABLETS BY MOUTH EVERY NIGHT AT BEDTIME 360 tablet 4  . hydrochlorothiazide (HYDRODIURIL) 25 MG tablet TAKE 1 TABLET BY MOUTH EVERY DAY 30 tablet 11  . Omega-3 Fatty Acids (FISH OIL) 1000 MG CAPS Take 1 capsule by mouth 2 (two) times daily.     Marland Kitchen oxymorphone (OPANA) 5 MG tablet Take 5 mg by mouth 3 (three) times daily.    . simvastatin (ZOCOR) 40 MG tablet Take 1 tablet (40 mg total) by mouth at bedtime. 90 tablet 3   No current facility-administered medications on file prior to visit.    Allergies  Allergen Reactions  . Sulfonamide Derivatives Hives and Rash    ROS: See HPI for pertinent positives and negatives.  Physical Examination  Vitals:   06/29/16 0849  BP: 123/77  Pulse: 72  Resp: 16  Temp: 98.2 F (36.8 C)  SpO2: 93%  Weight: 179 lb (81.2 kg)  Height: 5\' 1"  (1.549 m)   Body mass index is 33.82  kg/m.  General: A&O x 3, WD, obese female.  Pulmonary: Sym exp, respirations are non labored, fair air movt, few rales in posterior fields, no rhonchi or wheezing.  Cardiac: RRR, Nl S1, S2, no detected murmur.   Carotid Bruits Right Left   Negative Negative   Aorta is not palpable Radial pulses are 2+ palpable and =                          VASCULAR EXAM:                                                                                                         LE Pulses Right Left       FEMORAL  faintly palpable  2+ palpable        POPLITEAL  not palpable   not palpable       POSTERIOR TIBIAL  not palpable   not palpable        DORSALIS PEDIS      ANTERIOR TIBIAL 3+ palpable  2+ palpable      Gastrointestinal: soft, NTND, -G/R, - HSM, - masses palpated, - CVAT B.  Musculoskeletal: M/S 4/5 throughout, Extremities without ischemic changes. Right 2nd and 3rd toes are surgically absent. Hands with arthritic deformities.   Neurologic: CN 2-12 intact, Pain and light touch intact in extremities are intact, Motor exam as listed above.  Non-Invasive Vascular Imaging  [name] Duplex (06/29/2016)  Previous size: 3.24 cm (Date: 12-24-2014)  Current size:  3.1 cm (Date: 06-29-2016), Right CIA: 1.3 cm; Left CIA: 1.2 cm. Decreased visualization of the abdominal vasculature due to overlying bowel gas and patient body habitus.   Medical Decision Making  The patient is a 83 y.o. female who presents with a small asymptomatic [name] with no increase in size. Largest diameter today is 3.1 cm, based on limited visualization.   Based on this patient's exam and diagnostic studies, the patient will follow up in 18 months  with the following studies: [name] duplex.  Consideration for  repair of [name] would be made when the size is 5.5 cm, growth > 1 cm/yr, and symptomatic status.  I emphasized the importance of maximal medical management including strict control of blood pressure, blood glucose, and lipid  levels, antiplatelet agents, obtaining regular exercise, and cessation of exposure to secondhand smoke  The patient was given information about [name] including signs, symptoms, treatment, and how to minimize the risk of enlargement and rupture of aneurysms.    The patient was advised to call 911 should the patient experience sudden onset abdominal or back pain.   Thank you for allowing [place] to participate in this patient's care.  Clemon Chambers, RN, MSN, FNP-C [facility] [place] Office: [phone]  Clinic Physician: Scot Dock  06/29/2016, 9:00 AM

## 2016-06-29 NOTE — Patient Instructions (Addendum)
Abdominal Aortic Aneurysm Blood pumps away from the heart through tubes (blood vessels) called arteries. Aneurysms are weak or damaged places in the wall of an artery. It bulges out like a balloon. An abdominal aortic aneurysm happens in the main artery of the body (aorta). It can burst or tear, causing bleeding inside the body. This is an emergency. It needs treatment right away. What are the causes? The exact cause is unknown. Things that could cause this problem include:  Fat and other substances building up in the lining of a tube.  Swelling of the walls of a blood vessel.  Certain tissue diseases.  Belly (abdominal) trauma.  An infection in the main artery of the body. What increases the risk? There are things that make it more likely for you to have an aneurysm. These include:  Being over the age of 81 years old.  Having high blood pressure (hypertension).  Being a female.  Being white.  Being very overweight (obese).  Having a family history of aneurysm.  Using tobacco products. What are the signs or symptoms? Symptoms depend on the size of the aneurysm and how fast it grows. There may not be symptoms. If symptoms occur, they can include:  Pain (belly, side, lower back, or groin).  Feeling full after eating a small amount of food.  Feeling sick to your stomach (nauseous), throwing up (vomiting), or both.  Feeling a lump in your belly that feels like it is beating (pulsating).  Feeling like you will pass out (faint). How is this treated?  Medicine to control blood pressure and pain.  Imaging tests to see if the aneurysm gets bigger.  Surgery. How is this prevented? To lessen your chance of getting this condition:  Stop smoking. Stop chewing tobacco.  Limit or avoid alcohol.  Keep your blood pressure, blood sugar, and cholesterol within normal limits.  Eat less salt.  Eat foods low in saturated fats and cholesterol. These are found in animal and whole  dairy products.  Eat more fiber. Fiber is found in whole grains, vegetables, and fruits.  Keep a healthy weight.  Stay active and exercise often. This information is not intended to replace advice given to you by your health care provider. Make sure you discuss any questions you have with your health care provider. Document Released: 08/13/2012 Document Revised: 09/24/2015 Document Reviewed: 05/18/2012 Elsevier Interactive Patient Education  2017 [employer].    Before your next abdominal ultrasound:  Take two Extra-Strength Gas-X capsules at bedtime the night before the test. Take another two Extra-Strength Gas-X capsules 3 hours before the test.       Secondhand Smoke What is secondhand smoke? Secondhand smoke is smoke that comes from burning tobacco. It could be the smoke from a cigarette, a pipe, or a cigar. Even if you are not the one smoking, secondhand smoke exposes you to the dangers of smoking. This is called involuntary, or passive, smoking. There are two types of secondhand smoke:  Sidestream smoke is the smoke that comes off the lighted end of a cigarette, pipe, or cigar.  This type of smoke has the highest amount of cancer-causing agents (carcinogens).  The particles in sidestream smoke are smaller. They get into your lungs more easily.  Mainstream smoke is the smoke that is exhaled by a person who is smoking.  This type of smoke is also dangerous to your health. How can secondhand smoke affect my health? Studies show that there is no safe level of secondhand smoke.  This smoke contains thousands of chemicals. At least 74 of them are known to cause cancer. Secondhand smoke can also cause many other health problems. It has been linked to:  Lung cancer.  Cancer of the voice box (larynx) or throat.  Cancer of the sinuses.  Brain cancer.  Bladder cancer.  Stomach cancer.  Breast cancer.  White blood cell cancers (lymphoma and leukemia).  Brain and liver  tumors in children.  Heart disease and stroke in adults.  Pregnancy loss (miscarriage).  Diseases in children, such as:  Asthma.  Lung infections.  Ear infections.  Sudden infant death syndrome (SIDS).  Slow growth. Where can I be at risk for exposure to secondhand smoke?  For adults, the workplace is the main source of exposure to secondhand smoke.  Your workplace should have a policy separating smoking areas from nonsmoking areas.  Smoking areas should have a system for ventilating and cleaning the air.  For children, the home may be the most dangerous place for exposure to secondhand smoke.  Children who live in apartment buildings may be at risk from smoke drifting from hallways or other people's homes.  For everyone, many public places are possible sources of exposure to secondhand smoke.  These places include restaurants, shopping centers, and parks. How can I reduce my risk for exposure to secondhand smoke? The most important thing you can do is not smoke. Discourage family members from smoking. Other ways to reduce exposure for you and your family include the following:  Keep your home smoke free.  Make sure your child care providers do not smoke.  Warn your child about the dangers of smoking and secondhand smoke.  Do not allow smoking in your car. When someone smokes in a car, all the damaging chemicals from the smoke are confined in a small area.  Avoid public places where smoking is allowed. This information is not intended to replace advice given to you by your health care provider. Make sure you discuss any questions you have with your health care provider. Document Released: 05/26/2004 Document Revised: 03/15/2016 Document Reviewed: 08/02/2013 Elsevier Interactive Patient Education  2017 [employer].

## 2016-07-05 ENCOUNTER — Ambulatory Visit (INDEPENDENT_AMBULATORY_CARE_PROVIDER_SITE_OTHER): Payer: Medicare Other | Admitting: Podiatry

## 2016-07-05 ENCOUNTER — Encounter: Payer: Self-pay | Admitting: Podiatry

## 2016-07-05 DIAGNOSIS — M79676 Pain in unspecified toe(s): Secondary | ICD-10-CM

## 2016-07-05 DIAGNOSIS — Q828 Other specified congenital malformations of skin: Secondary | ICD-10-CM

## 2016-07-05 DIAGNOSIS — B351 Tinea unguium: Secondary | ICD-10-CM | POA: Diagnosis not present

## 2016-07-05 NOTE — Addendum Note (Signed)
Addended by: Lianne Cure A on: 07/05/2016 03:03 PM   Modules accepted: Orders

## 2016-07-05 NOTE — Progress Notes (Signed)
She presents today chief complaint of painful ingrown and elongated nails bilateral foot. She's also complaining of painful reactive hyperkeratosis bilateral.  Objective: Vital signs are stable she is alert and oriented 3 pulses are palpable. Sensorium is diminished persons lusty monofilament secondary to diabetes. Toenails are long sharply incurvated thickened dystrophic. Multiple reactive hyperkeratosis plantar aspect of bilateral foot.  Assessment: Pain in limb secondary to diabetes and diabetic peripheral neuropathy with hammertoe deformity loss of digits. Pain in limb secondary to onychomycosis. Pain in limb secondary to porokeratosis.  Plan: Debrided all reactive hyperkeratosis porokeratosis bilateral. Debridement of toenails 1 through 5 bilateral.

## 2016-07-07 ENCOUNTER — Other Ambulatory Visit: Payer: Self-pay | Admitting: Family Medicine

## 2016-07-07 ENCOUNTER — Ambulatory Visit: Payer: Medicare Other | Admitting: Podiatry

## 2016-07-07 DIAGNOSIS — Z1231 Encounter for screening mammogram for malignant neoplasm of breast: Secondary | ICD-10-CM

## 2016-07-13 ENCOUNTER — Other Ambulatory Visit: Payer: Self-pay | Admitting: Family Medicine

## 2016-07-26 ENCOUNTER — Ambulatory Visit: Payer: Medicare Other

## 2016-07-26 ENCOUNTER — Other Ambulatory Visit: Payer: Medicare Other

## 2016-08-10 ENCOUNTER — Encounter (HOSPITAL_COMMUNITY): Payer: Self-pay | Admitting: *Deleted

## 2016-08-10 ENCOUNTER — Emergency Department (HOSPITAL_COMMUNITY)
Admission: EM | Admit: 2016-08-10 | Discharge: 2016-08-10 | Disposition: A | Discharge status: Home / Self Care | Payer: Medicare Other | Attending: Emergency Medicine | Admitting: Emergency Medicine

## 2016-08-10 ENCOUNTER — Emergency Department (HOSPITAL_COMMUNITY): Payer: Medicare Other

## 2016-08-10 DIAGNOSIS — M13841 Other specified arthritis, right hand: Secondary | ICD-10-CM | POA: Diagnosis not present

## 2016-08-10 DIAGNOSIS — M1990 Unspecified osteoarthritis, unspecified site: Secondary | ICD-10-CM

## 2016-08-10 DIAGNOSIS — I10 Essential (primary) hypertension: Secondary | ICD-10-CM | POA: Diagnosis not present

## 2016-08-10 DIAGNOSIS — Z96651 Presence of right artificial knee joint: Secondary | ICD-10-CM | POA: Insufficient documentation

## 2016-08-10 DIAGNOSIS — Z7982 Long term (current) use of aspirin: Secondary | ICD-10-CM | POA: Insufficient documentation

## 2016-08-10 DIAGNOSIS — M25531 Pain in right wrist: Secondary | ICD-10-CM | POA: Insufficient documentation

## 2016-08-10 DIAGNOSIS — Z79899 Other long term (current) drug therapy: Secondary | ICD-10-CM | POA: Diagnosis not present

## 2016-08-10 DIAGNOSIS — M79641 Pain in right hand: Secondary | ICD-10-CM | POA: Diagnosis not present

## 2016-08-10 DIAGNOSIS — E119 Type 2 diabetes mellitus without complications: Secondary | ICD-10-CM | POA: Insufficient documentation

## 2016-08-10 DIAGNOSIS — S6991XA Unspecified injury of right wrist, hand and finger(s), initial encounter: Secondary | ICD-10-CM | POA: Diagnosis not present

## 2016-08-10 DIAGNOSIS — Z96641 Presence of right artificial hip joint: Secondary | ICD-10-CM | POA: Insufficient documentation

## 2016-08-10 MED ORDER — ACETAMINOPHEN 500 MG PO TABS
1000.0000 mg | ORAL_TABLET | Freq: Once | ORAL | Status: AC
  Administered 2016-08-10: 1000 mg via ORAL
  Filled 2016-08-10: qty 2

## 2016-08-10 MED ORDER — ACETAMINOPHEN 500 MG PO TABS: 1000.0000 mg | ORAL_TABLET | Freq: Three times a day (TID) | ORAL | 0 refills | Status: AC

## 2016-08-10 NOTE — ED Notes (Signed)
Pt verbalized understanding of discharge instructions.

## 2016-08-10 NOTE — ED Triage Notes (Signed)
Pt complains of pain and swelling to left forearm, wrist and hand for the past 2 days. Pt denies injury to arm.

## 2016-08-10 NOTE — ED Provider Notes (Signed)
[facility] DEPT Provider Note   CSN: [id number] Arrival date & time: 08/10/16  1900     History   Chief Complaint Chief Complaint  Patient presents with  . Arm Pain    HPI [name] is a 83 y.o. female.  The history is provided by the patient.  Arm Pain  This is a new problem. The current episode started more than 2 days ago. The problem occurs constantly. The problem has been gradually worsening. Pertinent negatives include no chest pain, no abdominal pain, no headaches and no shortness of breath. The symptoms are aggravated by twisting and bending. Nothing relieves the symptoms. She has tried acetaminophen for the symptoms. The treatment provided mild relief.   No trauma. No recent hand injury, IV injections, fever, or infections. No prior joint infection.  Past Medical History:  Diagnosis Date  . [name] (abdominal aortic aneurysm) ([place]) 09/2013   3.6 cm  . Dementia   . Diabetes mellitus    pre-diabetes  . Diverticulosis   . Hyperlipidemia   . Hypertension   . Nephrolithiasis   . OSA on CPAP   . Osteoarthritis   . Osteoporosis   . Rheumatoid arthritis(714.0)   . S/P carpal tunnel release   . Spinal stenosis     Patient Active Problem List   Diagnosis Date Noted  . Abnormal EKG 11/21/2013  . [name] (abdominal aortic aneurysm) ([place]) 09/30/2013  . OSA on CPAP 06/29/2012  . Dementia 06/29/2012  . Pre-diabetes 06/29/2012  . Hypoxia, sleep related 06/29/2012  . RA (rheumatoid arthritis) ([place]) 06/29/2012  . Nonspecific abnormal results of cardiovascular function study 06/02/2011  . Nonspecific abnormal electrocardiogram (ECG) (EKG) 05/18/2011  . OSTEOPOROSIS 04/09/2008  . FLANK PAIN, RIGHT 03/04/2008  . HYPERLIPIDEMIA 05/12/2006  . PYELONEPHRITIS, ACUTE 05/12/2006  . OSTEOARTHRITIS 05/12/2006  . DEPENDENT EDEMA, LEGS, BILATERAL 05/12/2006  . URINARY INCONTINENCE 05/12/2006  . PERIPHERAL NEUROPATHY 03/11/2006  . Essential hypertension 03/11/2006  . DYSURIA  03/11/2006    Past Surgical History:  Procedure Laterality Date  . APPENDECTOMY    . CARPAL TUNNEL RELEASE  12/27/2006   Right subcutaneous ulnar nerve transfer -- Right open carpal tunnel release  . CHOLECYSTECTOMY    . FOOT SURGERY    . JOINT REPLACEMENT     BTKR, RTHR  . TOTAL KNEE ARTHROPLASTY  08/21/2007   Right total knee replacement    OB History    No data available       Home Medications    Prior to Admission medications   Medication Sig Start Date End Date Taking? Authorizing Provider  aspirin EC 81 MG tablet Take 1 tablet (81 mg total) by mouth daily. 06/02/11  Yes Lelon Perla, MD  atenolol (TENORMIN) 25 MG tablet TAKE 1 TABLET(25 MG) BY MOUTH DAILY 07/13/16  Yes [name], MD  DULoxetine (CYMBALTA) 30 MG capsule Take 30 mg by mouth 3 (three) times daily.  07/29/14  Yes Historical Provider, MD  gabapentin (NEURONTIN) 800 MG tablet TAKE 1 TABLET BY MOUTH EVERY MORNING AND AT LUNCH, 2 TABLETS BY MOUTH EVERY NIGHT AT BEDTIME 01/12/16  Yes Max T Hyatt, DPM  hydrochlorothiazide (HYDRODIURIL) 25 MG tablet TAKE 1 TABLET BY MOUTH EVERY DAY 03/09/16  Yes [name], MD  oxymorphone (OPANA) 5 MG tablet Take 5 mg by mouth 3 (three) times daily.   Yes Historical Provider, MD  acetaminophen (TYLENOL) 500 MG tablet Take 2 tablets (1,000 mg total) by mouth every 8 (eight) hours. Do not take  more than 4000 mg of acetaminophen (Tylenol) in a 24-hour period. Please note that other medicines that you may be prescribed may have Tylenol as well. 08/10/16 08/15/16  Fatima Blank, MD  clotrimazole (LOTRIMIN) 1 % cream Apply 1 application topically 2 (two) times daily. Apply to affected area twice daily. Patient not taking: Reported on 08/10/2016 03/17/15   Max T Hyatt, DPM  simvastatin (ZOCOR) 40 MG tablet Take 1 tablet (40 mg total) by mouth at bedtime. Patient not taking: Reported on 08/10/2016 09/30/14   [name], MD    Family History Family History  Problem  Relation Age of Onset  . Stroke Father   . Coronary artery disease Son     CABG at age 30  . Heart disease Son   . Heart attack Son   . Stroke Mother   . Hypertension Sister   . Diabetes Brother   . Hypertension Maternal Aunt     Social History Social History  Substance Use Topics  . Smoking status: Never Smoker  . Smokeless tobacco: Never Used  . Alcohol use No     Allergies   Sulfonamide derivatives   Review of Systems Review of Systems  Respiratory: Negative for shortness of breath.   Cardiovascular: Negative for chest pain.  Gastrointestinal: Negative for abdominal pain.  Neurological: Negative for headaches.     Physical Exam Updated Vital Signs BP 123/73 (BP Location: Left Arm)   Pulse 84   Temp 98.2 F (36.8 C) (Oral)   Resp 18   SpO2 91%   Physical Exam  Constitutional: She is oriented to person, place, and time. She appears well-developed and well-nourished. No distress.  HENT:  Head: Normocephalic and atraumatic.  Right Ear: External ear normal.  Left Ear: External ear normal.  Nose: Nose normal.  Eyes: Conjunctivae and EOM are normal. No scleral icterus.  Neck: Normal range of motion and phonation normal.  Cardiovascular: Normal rate and regular rhythm.   Pulmonary/Chest: Effort normal. No stridor. No respiratory distress.  Abdominal: She exhibits no distension.  Musculoskeletal: Normal range of motion. She exhibits no edema.  Swan neck deformity to DIP. TTP to fingers, hand, and wrist. Pain with ROM. Mild swelling. No erythema.  Neurological: She is alert and oriented to person, place, and time.  Skin: She is not diaphoretic.  Psychiatric: She has a normal mood and affect. Her behavior is normal.  Vitals reviewed.    ED Treatments / Results  Labs (all labs ordered are listed, but only abnormal results are displayed) Labs Reviewed - No data to display  EKG  EKG Interpretation None       Radiology Dg Wrist Complete Right  Result  Date: 08/10/2016 CLINICAL DATA:  Acute onset of right hand and wrist pain. Initial encounter. EXAM: RIGHT WRIST - COMPLETE 3+ VIEW COMPARISON:  None. FINDINGS: There is no evidence of fracture or dislocation. Degenerative joint space narrowing is noted at the radial aspect of the carpal rows, including at the first carpometacarpal joint, with sclerosis and osteophyte formation. There is mild subluxation at the first metacarpophalangeal joint, and mild joint space narrowing at the first interphalangeal joint and second and third proximal interphalangeal joints. Diffuse vascular calcifications are seen. Calcification is seen at the triangular fibrocartilage. IMPRESSION: 1. No evidence of fracture or dislocation. 2. Degenerative change at the right hand likely reflects osteoarthritis. 3. Diffuse vascular calcifications. 4. Calcification of the triangular fibrocartilage. Electronically Signed   By: Garald Balding M.D.   On: 08/10/2016  21:23   Dg Hand Complete Right  Result Date: 08/10/2016 CLINICAL DATA:  83 year old female.  RIGHT hand pain. EXAM: RIGHT HAND - COMPLETE 3+ VIEW COMPARISON:  None. FINDINGS: Severe joint space narrowing associated sclerosis of the first and second carpal metacarpal joints. Associated osteophytosis. There is joint space narrowing in the distal interphalangeal joints. No evidence of erosive arthropathy. No clear soft tissue abnormality. Calcification in the triangular fibrocartilage. IMPRESSION: Pattern of degenerative findings suggest severe osteoarthritis. No acute findings. No erosive arthropathy. Chondrocalcinosis in the triangular fibrocartilage. Electronically Signed   By: Suzy Bouchard M.D.   On: 08/10/2016 21:20    Procedures Procedures (including critical care time)  Medications Ordered in ED Medications  acetaminophen (TYLENOL) tablet 1,000 mg (not administered)     Initial Impression / Assessment and Plan / ED Course  I have reviewed the triage vital signs and  the nursing notes.  Pertinent labs & imaging results that were available during my care of the patient were reviewed by me and considered in my medical decision making (see chart for details).     Plain film with significant joint space narrowing related to severe arthritis. No acute fractures or dislocation. Low suspicion for septic joint or DVT. Provided with tylenol and wrist brace. PCP follow up for further management.  The patient is safe for discharge with strict return precautions.   Final Clinical Impressions(s) / ED Diagnoses   Final diagnoses:  Right hand pain  Right wrist pain  Arthritis   Disposition: Discharge  Condition: Good  I have discussed the results, Dx and Tx plan with the patient and family who expressed understanding and agree(s) with the plan. Discharge instructions discussed at great length. The patient and family was given strict return precautions who verbalized understanding of the instructions. No further questions at time of discharge.    New Prescriptions   ACETAMINOPHEN (TYLENOL) 500 MG TABLET    Take 2 tablets (1,000 mg total) by mouth every 8 (eight) hours. Do not take more than 4000 mg of acetaminophen (Tylenol) in a 24-hour period. Please note that other medicines that you may be prescribed may have Tylenol as well.    Follow Up: [name], MD [address] [facility] [address] [place] [address] [phone]  Schedule an appointment as soon as possible for a visit  in 3-5 days for close follow up to assess for right wrist pain due to arthritis.      Fatima Blank, MD 08/10/16 2139

## 2016-08-15 ENCOUNTER — Ambulatory Visit
Admission: RE | Admit: 2016-08-15 | Discharge: 2016-08-15 | Disposition: A | Discharge status: Home / Self Care | Payer: Medicare Other | Source: Ambulatory Visit | Attending: Family Medicine | Admitting: Family Medicine

## 2016-08-15 DIAGNOSIS — Z1231 Encounter for screening mammogram for malignant neoplasm of breast: Secondary | ICD-10-CM

## 2016-08-15 DIAGNOSIS — M8589 Other specified disorders of bone density and structure, multiple sites: Secondary | ICD-10-CM | POA: Diagnosis not present

## 2016-08-15 DIAGNOSIS — Z780 Asymptomatic menopausal state: Secondary | ICD-10-CM | POA: Diagnosis not present

## 2016-08-16 ENCOUNTER — Encounter: Payer: Self-pay | Admitting: Family Medicine

## 2016-08-18 ENCOUNTER — Encounter: Payer: Self-pay | Admitting: Family Medicine

## 2016-10-04 ENCOUNTER — Encounter: Payer: Self-pay | Admitting: Podiatry

## 2016-10-04 ENCOUNTER — Ambulatory Visit (INDEPENDENT_AMBULATORY_CARE_PROVIDER_SITE_OTHER): Payer: Medicare Other | Admitting: Podiatry

## 2016-10-04 DIAGNOSIS — B351 Tinea unguium: Secondary | ICD-10-CM

## 2016-10-04 DIAGNOSIS — M79676 Pain in unspecified toe(s): Secondary | ICD-10-CM | POA: Diagnosis not present

## 2016-10-04 DIAGNOSIS — Q828 Other specified congenital malformations of skin: Secondary | ICD-10-CM

## 2016-10-04 DIAGNOSIS — E1142 Type 2 diabetes mellitus with diabetic polyneuropathy: Secondary | ICD-10-CM

## 2016-10-05 MED ORDER — GABAPENTIN 800 MG PO TABS: ORAL_TABLET | ORAL | 4 refills | Status: DC

## 2016-10-05 NOTE — Progress Notes (Signed)
She presents today complaining of painful elongated toenails and calluses bilateral states that her pain her neuropathic pain is worse bilaterally. She'll have a refill on her Neurontin.  Objective: Vital signs are stable she is alert and oriented 3. Pulses are palpable. Neurologic sensorium is unchanged. Toenails are long thick yellow dystrophic clinically mycotic painful palpation. Multiple porokeratotic lesions and calluses of plantar and plantar medial aspect of the bilateral foot.  Assessment: Diabetes mellitus diabetic peripheral neuropathy. Pain in limb second onychomycosis and porokeratosis.  Plan: Debridement of toenails and calluses bilateral. Refill her gabapentin.

## 2016-12-15 DIAGNOSIS — R42 Dizziness and giddiness: Secondary | ICD-10-CM | POA: Insufficient documentation

## 2016-12-22 ENCOUNTER — Encounter: Payer: Self-pay | Admitting: Family Medicine

## 2016-12-22 ENCOUNTER — Ambulatory Visit (INDEPENDENT_AMBULATORY_CARE_PROVIDER_SITE_OTHER): Payer: Medicare Other | Admitting: Family Medicine

## 2016-12-22 VITALS — BP 120/66 | HR 68 | Temp 98.0°F | Resp 20 | Ht 60.0 in | Wt 177.0 lb

## 2016-12-22 DIAGNOSIS — R4781 Slurred speech: Secondary | ICD-10-CM

## 2016-12-22 DIAGNOSIS — Z79899 Other long term (current) drug therapy: Secondary | ICD-10-CM

## 2016-12-22 DIAGNOSIS — R296 Repeated falls: Secondary | ICD-10-CM

## 2016-12-22 DIAGNOSIS — R42 Dizziness and giddiness: Secondary | ICD-10-CM

## 2016-12-22 MED ORDER — MECLIZINE HCL 25 MG PO TABS: 25.0000 mg | ORAL_TABLET | Freq: Three times a day (TID) | ORAL | 0 refills | Status: DC | PRN

## 2016-12-22 NOTE — Progress Notes (Signed)
Subjective:    Patient ID: [name], female    DOB: [birth year], 83 y.o.   MRN: [id number]  HPI Referred by her pain management specialist for dizziness and frequent falls.  He had recommended work up for stroke.  Patient states that over the last month, she has had frequent attacks of dizziness. The attacks occur with motion. They also occur while sitting still. They're associated with a spinning sensation. However occasionally they also are just associated with lightheadedness. She also reports a roaring sound in her brain.  She is falling frequently and is now having to ambulate with a cane due to balance issues stemming from this. Patient is on numerous medications that can cause dizziness due to severe peripheral neuropathy including gabapentin 800 mg twice daily and 1600 mg at night, oxymorphone, Cymbalta, and 2 separate antihypertensives. She denies any head injuries. She denies any loss of consciousness. She denies any tachycardia or palpitations. She is slurring her words slightly. There is no facial droop. Cranial nerves II through XII are grossly intact. Cerebellar exam today is normal although the patient shows slow movement and is walking with a cane and tends to have a slow shuffling gait. There is no gross neurologic deficit in her upper and lower extremities. There is no pronator drift. Past Medical History:  Diagnosis Date  . [name] (abdominal aortic aneurysm) ([place]) 09/2013   3.6 cm  . Dementia   . Diabetes mellitus    pre-diabetes  . Diverticulosis   . Hyperlipidemia   . Hypertension   . Nephrolithiasis   . OSA on CPAP   . Osteoarthritis   . Osteopenia    T=  -2.1 in hip  . Rheumatoid arthritis(714.0)   . S/P carpal tunnel release   . Spinal stenosis    Past Surgical History:  Procedure Laterality Date  . APPENDECTOMY    . CARPAL TUNNEL RELEASE  12/27/2006   Right subcutaneous ulnar nerve transfer -- Right open carpal tunnel release  . CHOLECYSTECTOMY    . FOOT  SURGERY    . JOINT REPLACEMENT     BTKR, RTHR  . TOTAL KNEE ARTHROPLASTY  08/21/2007   Right total knee replacement   Current Outpatient Prescriptions on File Prior to Visit  Medication Sig Dispense Refill  . aspirin EC 81 MG tablet Take 1 tablet (81 mg total) by mouth daily. 150 tablet 2  . atenolol (TENORMIN) 25 MG tablet TAKE 1 TABLET(25 MG) BY MOUTH DAILY 90 tablet 3  . clotrimazole (LOTRIMIN) 1 % cream Apply 1 application topically 2 (two) times daily. Apply to affected area twice daily. 30 g 0  . DULoxetine (CYMBALTA) 30 MG capsule Take 30 mg by mouth 3 (three) times daily.   2  . gabapentin (NEURONTIN) 800 MG tablet TAKE 1 TABLET BY MOUTH EVERY MORNING AND AT LUNCH, 2 TABLETS BY MOUTH EVERY NIGHT AT BEDTIME 360 tablet 4  . hydrochlorothiazide (HYDRODIURIL) 25 MG tablet TAKE 1 TABLET BY MOUTH EVERY DAY 30 tablet 11  . oxymorphone (OPANA) 5 MG tablet Take 5 mg by mouth 3 (three) times daily.    . simvastatin (ZOCOR) 40 MG tablet Take 1 tablet (40 mg total) by mouth at bedtime. 90 tablet 3   No current facility-administered medications on file prior to visit.    Allergies  Allergen Reactions  . Sulfonamide Derivatives Hives and Rash   Social History   Social History  . Marital status: Divorced    Spouse name: N/A  . Number  of children: 6  . Years of education: N/A   Occupational History  . Not on file.   Social History Main Topics  . Smoking status: Never Smoker  . Smokeless tobacco: Never Used  . Alcohol use No  . Drug use: No  . Sexual activity: Not on file   Other Topics Concern  . Not on file   Social History Narrative  . No narrative on file     Review of Systems  All other systems reviewed and are negative.      Objective:   Physical Exam  Constitutional: She is oriented to person, place, and time. She appears well-developed and well-nourished. No distress.  HENT:  Right Ear: External ear normal.  Left Ear: External ear normal.  Nose: Nose normal.   Mouth/Throat: Oropharynx is clear and moist. No oropharyngeal exudate.  Eyes: Pupils are equal, round, and reactive to light. Conjunctivae and EOM are normal.  Cardiovascular: Normal rate, regular rhythm and normal heart sounds.   Pulmonary/Chest: Effort normal and breath sounds normal. No respiratory distress. She has no wheezes. She has no rales.  Abdominal: Bowel sounds are normal.  Lymphadenopathy:    She has no cervical adenopathy.  Neurological: She is alert and oriented to person, place, and time. She has normal reflexes. She displays normal reflexes. No cranial nerve deficit. She exhibits normal muscle tone. Coordination normal.  Skin: She is not diaphoretic.  Psychiatric: Judgment and thought content normal. Her speech is slurred. She is slowed. Cognition and memory are normal.  Vitals reviewed.         Assessment & Plan:  Dizziness - Plan: MR Brain W Wo Contrast  Falls frequently - Plan: MR Brain W Wo Contrast  Slurred speech - Plan: MR Brain W Wo Contrast  Polypharmacy  Vertigo - Plan: MR Brain W Wo Contrast  I believe the situation is multi-factorial. Physical exam is significant for a positive Dix-Hallpike maneuver to the right with resultant vertigo. Therefore I believe much of what she is experiencing is likely BPPV. We can treat this with meclizine 25 mg every 8 hours as needed and tincture of time. However I'm concerned that the meclizine will exacerbate her second problem which is polypharmacy. I believe that the vertigo, polypharmacy, age, and deconditioning is contributing to her frequent falls. Family is concerned about a stroke as the patient's son recently had a stroke. I believe is reasonable given her neurologic findings to proceed with an MRI of the brain. However I also recommended that we try to gradually wean down on her medication if possible. I recommended reducing her dosage of gabapentin by 50% to see if symptoms would improve. I recommended 400 mg twice  daily and 800 mg at night particularly while she is taking meclizine.  Given the roaring sensation in her ears, which I would interpret as tinnitus, Mnire's disease is also on the differential diagnosis

## 2017-01-01 ENCOUNTER — Ambulatory Visit
Admission: RE | Admit: 2017-01-01 | Discharge: 2017-01-01 | Disposition: A | Discharge status: Home / Self Care | Payer: Medicare Other | Source: Ambulatory Visit | Attending: Family Medicine | Admitting: Family Medicine

## 2017-01-01 DIAGNOSIS — R42 Dizziness and giddiness: Secondary | ICD-10-CM

## 2017-01-01 DIAGNOSIS — R4781 Slurred speech: Secondary | ICD-10-CM

## 2017-01-01 DIAGNOSIS — R296 Repeated falls: Secondary | ICD-10-CM

## 2017-01-01 MED ORDER — GADOBENATE DIMEGLUMINE 529 MG/ML IV SOLN
15.0000 mL | Freq: Once | INTRAVENOUS | Status: AC | PRN
  Administered 2017-01-01: 15 mL via INTRAVENOUS

## 2017-01-04 ENCOUNTER — Other Ambulatory Visit: Payer: Medicare Other

## 2017-01-05 ENCOUNTER — Encounter: Payer: Self-pay | Admitting: Podiatry

## 2017-01-05 ENCOUNTER — Encounter: Payer: Self-pay | Admitting: Family Medicine

## 2017-01-05 ENCOUNTER — Ambulatory Visit (INDEPENDENT_AMBULATORY_CARE_PROVIDER_SITE_OTHER): Payer: Medicare Other | Admitting: Podiatry

## 2017-01-05 ENCOUNTER — Ambulatory Visit (INDEPENDENT_AMBULATORY_CARE_PROVIDER_SITE_OTHER): Payer: Medicare Other | Admitting: Family Medicine

## 2017-01-05 VITALS — BP 118/78 | HR 66 | Temp 98.0°F | Resp 14 | Ht 60.0 in | Wt 182.0 lb

## 2017-01-05 DIAGNOSIS — E1142 Type 2 diabetes mellitus with diabetic polyneuropathy: Secondary | ICD-10-CM

## 2017-01-05 DIAGNOSIS — Q828 Other specified congenital malformations of skin: Secondary | ICD-10-CM

## 2017-01-05 DIAGNOSIS — Z23 Encounter for immunization: Secondary | ICD-10-CM | POA: Diagnosis not present

## 2017-01-05 DIAGNOSIS — B351 Tinea unguium: Secondary | ICD-10-CM

## 2017-01-05 DIAGNOSIS — I639 Cerebral infarction, unspecified: Secondary | ICD-10-CM | POA: Diagnosis not present

## 2017-01-05 DIAGNOSIS — I6381 Other cerebral infarction due to occlusion or stenosis of small artery: Secondary | ICD-10-CM

## 2017-01-05 DIAGNOSIS — M79676 Pain in unspecified toe(s): Secondary | ICD-10-CM

## 2017-01-05 DIAGNOSIS — M722 Plantar fascial fibromatosis: Secondary | ICD-10-CM

## 2017-01-05 MED ORDER — ROSUVASTATIN CALCIUM 40 MG PO TABS: 40.0000 mg | ORAL_TABLET | Freq: Every day | ORAL | 3 refills | Status: DC

## 2017-01-05 MED ORDER — CLOPIDOGREL BISULFATE 75 MG PO TABS: 75.0000 mg | ORAL_TABLET | Freq: Every day | ORAL | 3 refills | Status: DC

## 2017-01-05 NOTE — Progress Notes (Signed)
She presents today with a chief complaint of painful elongated toenails and multiple porokeratosis bilaterally. She is also complaining of a painful plantar fasciitis bilaterally.  Objective: Vital signs are stable alert and oriented 3. Pulses are palpable. Neurologic sensorium is diminished considerably per Semmes-Weinstein monofilament. Nails are thick yellow dystrophic onychomycotic pain on palpation medial calcaneal tubercles bilateral.  Assessment: Diabetic peripheral neuropathy with painful mycotic nails and porokeratotic lesions. Painful plantar fasciitis bilateral.  Plan: Debridement of toenails porokeratosis and injected the bilateral heels can log local anesthetic. Follow-up with her in 3 months.

## 2017-01-05 NOTE — Progress Notes (Signed)
Subjective:    Patient ID: [name], female    DOB: 01/17/1934, 83 y.o.   MRN: [id number]  HPI  8.23.18 Referred by her pain management specialist for dizziness and frequent falls.  He had recommended work up for stroke.  Patient states that over the last month, she has had frequent attacks of dizziness. The attacks occur with motion. They also occur while sitting still. They're associated with a spinning sensation. However occasionally they also are just associated with lightheadedness. She also reports a roaring sound in her brain.  She is falling frequently and is now having to ambulate with a cane due to balance issues stemming from this. Patient is on numerous medications that can cause dizziness due to severe peripheral neuropathy including gabapentin 800 mg twice daily and 1600 mg at night, oxymorphone, Cymbalta, and 2 separate antihypertensives. She denies any head injuries. She denies any loss of consciousness. She denies any tachycardia or palpitations. She is slurring her words slightly. There is no facial droop. Cranial nerves II through XII are grossly intact. Cerebellar exam today is normal although the patient shows slow movement and is walking with a cane and tends to have a slow shuffling gait. There is no gross neurologic deficit in her upper and lower extremities. There is no pronator drift.  At that time, my plan was: I believe the situation is multi-factorial. Physical exam is significant for a positive Dix-Hallpike maneuver to the right with resultant vertigo. Therefore I believe much of what she is experiencing is likely BPPV. We can treat this with meclizine 25 mg every 8 hours as needed and tincture of time. However I'm concerned that the meclizine will exacerbate her second problem which is polypharmacy. I believe that the vertigo, polypharmacy, age, and deconditioning is contributing to her frequent falls. Family is concerned about a stroke as the patient's son recently had a  stroke. I believe is reasonable given her neurologic findings to proceed with an MRI of the brain. However I also recommended that we try to gradually wean down on her medication if possible. I recommended reducing her dosage of gabapentin by 50% to see if symptoms would improve. I recommended 400 mg twice daily and 800 mg at night particularly while she is taking meclizine.  Given the roaring sensation in her ears, which I would interpret as tinnitus, Mnire's disease is also on the differential diagnosis  01/05/17 MRI revealed chronic microvascular ischemia and small vessel changes as well as a subacute lacunar infarct in the right MCA territory. Patient is here today to discuss further. She has not seen any benefit since starting meclizine. She is also not seen any benefit in her dizziness since discontinuing gabapentin. She admits that she has not been taking her aspirin. Her most recent cholesterol shows an LDL cholesterol of 116 on simvastatin. Past Medical History:  Diagnosis Date  . [name] (abdominal aortic aneurysm) ([place]) 09/2013   3.6 cm  . Dementia   . Diabetes mellitus    pre-diabetes  . Diverticulosis   . Hyperlipidemia   . Hypertension   . Nephrolithiasis   . OSA on CPAP   . Osteoarthritis   . Osteopenia    T=  -2.1 in hip  . Rheumatoid arthritis(714.0)   . S/P carpal tunnel release   . Spinal stenosis    Past Surgical History:  Procedure Laterality Date  . APPENDECTOMY    . CARPAL TUNNEL RELEASE  12/27/2006   Right subcutaneous ulnar nerve transfer -- Right  open carpal tunnel release  . CHOLECYSTECTOMY    . FOOT SURGERY    . JOINT REPLACEMENT     BTKR, RTHR  . TOTAL KNEE ARTHROPLASTY  08/21/2007   Right total knee replacement   Current Outpatient Prescriptions on File Prior to Visit  Medication Sig Dispense Refill  . aspirin EC 81 MG tablet Take 1 tablet (81 mg total) by mouth daily. 150 tablet 2  . atenolol (TENORMIN) 25 MG tablet TAKE 1 TABLET(25 MG) BY MOUTH DAILY  90 tablet 3  . DULoxetine (CYMBALTA) 30 MG capsule Take 30 mg by mouth 3 (three) times daily.   2  . gabapentin (NEURONTIN) 800 MG tablet TAKE 1 TABLET BY MOUTH EVERY MORNING AND AT LUNCH, 2 TABLETS BY MOUTH EVERY NIGHT AT BEDTIME 360 tablet 4  . hydrochlorothiazide (HYDRODIURIL) 25 MG tablet TAKE 1 TABLET BY MOUTH EVERY DAY 30 tablet 11  . meclizine (ANTIVERT) 25 MG tablet Take 1 tablet (25 mg total) by mouth 3 (three) times daily as needed for dizziness. 30 tablet 0  . oxymorphone (OPANA) 5 MG tablet Take 5 mg by mouth 3 (three) times daily.     No current facility-administered medications on file prior to visit.    Allergies  Allergen Reactions  . Sulfonamide Derivatives Hives and Rash   Social History   Social History  . Marital status: Divorced    Spouse name: N/A  . Number of children: 6  . Years of education: N/A   Occupational History  . Not on file.   Social History Main Topics  . Smoking status: Never Smoker  . Smokeless tobacco: Never Used  . Alcohol use No  . Drug use: No  . Sexual activity: Not on file   Other Topics Concern  . Not on file   Social History Narrative  . No narrative on file     Review of Systems  All other systems reviewed and are negative.      Objective:   Physical Exam  Constitutional: She is oriented to person, place, and time. She appears well-developed and well-nourished. No distress.  HENT:  Right Ear: External ear normal.  Left Ear: External ear normal.  Nose: Nose normal.  Mouth/Throat: Oropharynx is clear and moist. No oropharyngeal exudate.  Eyes: Pupils are equal, round, and reactive to light. Conjunctivae and EOM are normal.  Cardiovascular: Normal rate, regular rhythm and normal heart sounds.   Pulmonary/Chest: Effort normal and breath sounds normal. No respiratory distress. She has no wheezes. She has no rales.  Abdominal: Bowel sounds are normal.  Lymphadenopathy:    She has no cervical adenopathy.  Neurological:  She is alert and oriented to person, place, and time. She has normal reflexes. No cranial nerve deficit. She exhibits normal muscle tone. Coordination normal.  Skin: She is not diaphoretic.  Psychiatric: Judgment and thought content normal. Her speech is slurred. She is slowed. Cognition and memory are normal.  Vitals reviewed.         Assessment & Plan:  Lacunar infarction ([place]) - Plan: clopidogrel (PLAVIX) 75 MG tablet, rosuvastatin (CRESTOR) 40 MG tablet, Ambulatory referral to Physical Therapy, US Carotid Duplex Bilateral  Need for immunization against influenza - Plan: Flu Vaccine QUAD 36+ mos IM Discontinue meclizine. Replace aspirin with Plavix 75 mg by mouth daily for secondary prevention of stroke. Discontinue simvastatin and begin Crestor 40 mg a day and recheck LDL cholesterol in 3 months. Obtain carotid Dopplers to rule out carotid artery stenosis. Consult physical therapy  to assist with balance.

## 2017-01-12 ENCOUNTER — Ambulatory Visit
Admission: RE | Admit: 2017-01-12 | Discharge: 2017-01-12 | Disposition: A | Discharge status: Home / Self Care | Payer: Medicare Other | Source: Ambulatory Visit | Attending: Family Medicine | Admitting: Family Medicine

## 2017-01-12 DIAGNOSIS — I6523 Occlusion and stenosis of bilateral carotid arteries: Secondary | ICD-10-CM | POA: Diagnosis not present

## 2017-01-12 DIAGNOSIS — I6381 Other cerebral infarction due to occlusion or stenosis of small artery: Secondary | ICD-10-CM

## 2017-02-01 ENCOUNTER — Inpatient Hospital Stay (HOSPITAL_COMMUNITY)
Admission: EM | Admit: 2017-02-01 | Discharge: 2017-02-05 | DRG: 872 | Disposition: A | Discharge status: Home / Self Care | Payer: Medicare Other | Attending: Family Medicine | Admitting: Family Medicine

## 2017-02-01 ENCOUNTER — Encounter (HOSPITAL_COMMUNITY): Payer: Self-pay

## 2017-02-01 DIAGNOSIS — I4892 Unspecified atrial flutter: Secondary | ICD-10-CM | POA: Diagnosis not present

## 2017-02-01 DIAGNOSIS — I4891 Unspecified atrial fibrillation: Secondary | ICD-10-CM | POA: Diagnosis not present

## 2017-02-01 DIAGNOSIS — E785 Hyperlipidemia, unspecified: Secondary | ICD-10-CM | POA: Diagnosis present

## 2017-02-01 DIAGNOSIS — Z96643 Presence of artificial hip joint, bilateral: Secondary | ICD-10-CM | POA: Diagnosis present

## 2017-02-01 DIAGNOSIS — F0390 Unspecified dementia without behavioral disturbance: Secondary | ICD-10-CM | POA: Diagnosis present

## 2017-02-01 DIAGNOSIS — Z79899 Other long term (current) drug therapy: Secondary | ICD-10-CM

## 2017-02-01 DIAGNOSIS — A4151 Sepsis due to Escherichia coli [E. coli]: Principal | ICD-10-CM | POA: Diagnosis present

## 2017-02-01 DIAGNOSIS — Z7902 Long term (current) use of antithrombotics/antiplatelets: Secondary | ICD-10-CM

## 2017-02-01 DIAGNOSIS — B9689 Other specified bacterial agents as the cause of diseases classified elsewhere: Secondary | ICD-10-CM | POA: Diagnosis not present

## 2017-02-01 DIAGNOSIS — A419 Sepsis, unspecified organism: Secondary | ICD-10-CM

## 2017-02-01 DIAGNOSIS — G894 Chronic pain syndrome: Secondary | ICD-10-CM | POA: Diagnosis present

## 2017-02-01 DIAGNOSIS — G4733 Obstructive sleep apnea (adult) (pediatric): Secondary | ICD-10-CM | POA: Diagnosis present

## 2017-02-01 DIAGNOSIS — Z96653 Presence of artificial knee joint, bilateral: Secondary | ICD-10-CM | POA: Diagnosis present

## 2017-02-01 DIAGNOSIS — N3000 Acute cystitis without hematuria: Secondary | ICD-10-CM | POA: Diagnosis not present

## 2017-02-01 DIAGNOSIS — D696 Thrombocytopenia, unspecified: Secondary | ICD-10-CM | POA: Diagnosis present

## 2017-02-01 DIAGNOSIS — M069 Rheumatoid arthritis, unspecified: Secondary | ICD-10-CM | POA: Diagnosis present

## 2017-02-01 DIAGNOSIS — R251 Tremor, unspecified: Secondary | ICD-10-CM | POA: Diagnosis not present

## 2017-02-01 DIAGNOSIS — E119 Type 2 diabetes mellitus without complications: Secondary | ICD-10-CM | POA: Diagnosis present

## 2017-02-01 DIAGNOSIS — I6789 Other cerebrovascular disease: Secondary | ICD-10-CM | POA: Diagnosis not present

## 2017-02-01 DIAGNOSIS — R000 Tachycardia, unspecified: Secondary | ICD-10-CM

## 2017-02-01 DIAGNOSIS — R509 Fever, unspecified: Secondary | ICD-10-CM | POA: Diagnosis not present

## 2017-02-01 DIAGNOSIS — I700 Atherosclerosis of aorta: Secondary | ICD-10-CM | POA: Diagnosis present

## 2017-02-01 DIAGNOSIS — I10 Essential (primary) hypertension: Secondary | ICD-10-CM | POA: Diagnosis present

## 2017-02-01 DIAGNOSIS — Z7982 Long term (current) use of aspirin: Secondary | ICD-10-CM

## 2017-02-01 DIAGNOSIS — E860 Dehydration: Secondary | ICD-10-CM | POA: Diagnosis not present

## 2017-02-01 DIAGNOSIS — D6489 Other specified anemias: Secondary | ICD-10-CM | POA: Diagnosis present

## 2017-02-01 DIAGNOSIS — N390 Urinary tract infection, site not specified: Secondary | ICD-10-CM

## 2017-02-01 MED ORDER — SODIUM CHLORIDE 0.9 % IV BOLUS (SEPSIS)
500.0000 mL | Freq: Once | INTRAVENOUS | Status: AC
  Administered 2017-02-02: 500 mL via INTRAVENOUS

## 2017-02-01 MED ORDER — ACETAMINOPHEN 325 MG PO TABS
650.0000 mg | ORAL_TABLET | Freq: Once | ORAL | Status: DC
Start: 2017-02-02 — End: 2017-02-02
  Filled 2017-02-01: qty 2

## 2017-02-01 MED ORDER — VANCOMYCIN HCL IN DEXTROSE 1-5 GM/200ML-% IV SOLN
1000.0000 mg | Freq: Once | INTRAVENOUS | Status: AC
  Administered 2017-02-02: 1000 mg via INTRAVENOUS
  Filled 2017-02-01: qty 200

## 2017-02-01 MED ORDER — SODIUM CHLORIDE 0.9 % IV BOLUS (SEPSIS)
1000.0000 mL | Freq: Once | INTRAVENOUS | Status: AC
  Administered 2017-02-02: 1000 mL via INTRAVENOUS

## 2017-02-01 MED ORDER — PIPERACILLIN-TAZOBACTAM 3.375 G IVPB 30 MIN
3.3750 g | Freq: Once | INTRAVENOUS | Status: AC
  Administered 2017-02-02: 3.375 g via INTRAVENOUS
  Filled 2017-02-01: qty 50

## 2017-02-01 NOTE — ED Triage Notes (Signed)
Pt BIB [facility] from home felt well for last couple of days. Pt has been having foul smelling urination with burning sensation. Pt at home 100 Temp at home pt was given 650 tylenol at home. Stroke screen was negative. Pt is axo x4 right now.

## 2017-02-02 ENCOUNTER — Emergency Department (HOSPITAL_COMMUNITY): Payer: Medicare Other

## 2017-02-02 DIAGNOSIS — G894 Chronic pain syndrome: Secondary | ICD-10-CM

## 2017-02-02 DIAGNOSIS — E860 Dehydration: Secondary | ICD-10-CM

## 2017-02-02 DIAGNOSIS — E785 Hyperlipidemia, unspecified: Secondary | ICD-10-CM | POA: Diagnosis not present

## 2017-02-02 DIAGNOSIS — I700 Atherosclerosis of aorta: Secondary | ICD-10-CM | POA: Diagnosis present

## 2017-02-02 DIAGNOSIS — G4733 Obstructive sleep apnea (adult) (pediatric): Secondary | ICD-10-CM

## 2017-02-02 DIAGNOSIS — A4151 Sepsis due to Escherichia coli [E. coli]: Secondary | ICD-10-CM | POA: Diagnosis present

## 2017-02-02 DIAGNOSIS — Z7982 Long term (current) use of aspirin: Secondary | ICD-10-CM | POA: Diagnosis not present

## 2017-02-02 DIAGNOSIS — A419 Sepsis, unspecified organism: Secondary | ICD-10-CM | POA: Diagnosis not present

## 2017-02-02 DIAGNOSIS — I4891 Unspecified atrial fibrillation: Secondary | ICD-10-CM | POA: Diagnosis not present

## 2017-02-02 DIAGNOSIS — R509 Fever, unspecified: Secondary | ICD-10-CM | POA: Diagnosis not present

## 2017-02-02 DIAGNOSIS — E119 Type 2 diabetes mellitus without complications: Secondary | ICD-10-CM | POA: Diagnosis present

## 2017-02-02 DIAGNOSIS — M069 Rheumatoid arthritis, unspecified: Secondary | ICD-10-CM | POA: Diagnosis present

## 2017-02-02 DIAGNOSIS — N390 Urinary tract infection, site not specified: Secondary | ICD-10-CM

## 2017-02-02 DIAGNOSIS — Z96643 Presence of artificial hip joint, bilateral: Secondary | ICD-10-CM | POA: Diagnosis present

## 2017-02-02 DIAGNOSIS — I10 Essential (primary) hypertension: Secondary | ICD-10-CM

## 2017-02-02 DIAGNOSIS — Z79899 Other long term (current) drug therapy: Secondary | ICD-10-CM | POA: Diagnosis not present

## 2017-02-02 DIAGNOSIS — Z7902 Long term (current) use of antithrombotics/antiplatelets: Secondary | ICD-10-CM | POA: Diagnosis not present

## 2017-02-02 DIAGNOSIS — Z96653 Presence of artificial knee joint, bilateral: Secondary | ICD-10-CM | POA: Diagnosis present

## 2017-02-02 DIAGNOSIS — R9431 Abnormal electrocardiogram [ECG] [EKG]: Secondary | ICD-10-CM | POA: Diagnosis not present

## 2017-02-02 DIAGNOSIS — D696 Thrombocytopenia, unspecified: Secondary | ICD-10-CM | POA: Diagnosis present

## 2017-02-02 DIAGNOSIS — N3000 Acute cystitis without hematuria: Secondary | ICD-10-CM | POA: Diagnosis not present

## 2017-02-02 DIAGNOSIS — F0390 Unspecified dementia without behavioral disturbance: Secondary | ICD-10-CM | POA: Diagnosis present

## 2017-02-02 DIAGNOSIS — I4892 Unspecified atrial flutter: Secondary | ICD-10-CM | POA: Diagnosis not present

## 2017-02-02 DIAGNOSIS — R000 Tachycardia, unspecified: Secondary | ICD-10-CM | POA: Diagnosis not present

## 2017-02-02 DIAGNOSIS — D6489 Other specified anemias: Secondary | ICD-10-CM | POA: Diagnosis present

## 2017-02-02 LAB — BASIC METABOLIC PANEL
Anion gap: 8 (ref 5–15)
BUN: 20 mg/dL (ref 6–20)
CO2: 27 mmol/L (ref 22–32)
Calcium: 8.4 mg/dL — ABNORMAL LOW (ref 8.9–10.3)
Chloride: 104 mmol/L (ref 101–111)
Creatinine, Ser: 1.14 mg/dL — ABNORMAL HIGH (ref 0.44–1.00)
GFR calc Af Amer: 51 mL/min — ABNORMAL LOW (ref >60)
GFR calc non Af Amer: 44 mL/min — ABNORMAL LOW (ref >60)
Glucose, Bld: 136 mg/dL — ABNORMAL HIGH (ref 65–99)
Potassium: 3.5 mmol/L (ref 3.5–5.1)
Sodium: 139 mmol/L (ref 135–145)

## 2017-02-02 LAB — CBC WITH DIFFERENTIAL/PLATELET
Basophils Absolute: 0 10*3/uL (ref 0.0–0.1)
Basophils Relative: 0 %
Eosinophils Absolute: 0 10*3/uL (ref 0.0–0.7)
Eosinophils Relative: 0 %
HCT: 38 % (ref 36.0–46.0)
Hemoglobin: 12.1 g/dL (ref 12.0–15.0)
Lymphocytes Relative: 10 %
Lymphs Abs: 1.5 10*3/uL (ref 0.7–4.0)
MCH: 29.7 pg (ref 26.0–34.0)
MCHC: 31.8 g/dL (ref 30.0–36.0)
MCV: 93.1 fL (ref 78.0–100.0)
Monocytes Absolute: 1.4 10*3/uL — ABNORMAL HIGH (ref 0.1–1.0)
Monocytes Relative: 9 %
Neutro Abs: 11.4 10*3/uL — ABNORMAL HIGH (ref 1.7–7.7)
Neutrophils Relative %: 81 %
Platelets: 163 10*3/uL (ref 150–400)
RBC: 4.08 MIL/uL (ref 3.87–5.11)
RDW: 13.4 % (ref 11.5–15.5)
WBC: 14.3 10*3/uL — ABNORMAL HIGH (ref 4.0–10.5)

## 2017-02-02 LAB — URINALYSIS, ROUTINE W REFLEX MICROSCOPIC
Bilirubin Urine: NEGATIVE
Glucose, UA: NEGATIVE mg/dL
Ketones, ur: NEGATIVE mg/dL
Nitrite: POSITIVE — AB
Protein, ur: 100 mg/dL — AB
Specific Gravity, Urine: 1.016 (ref 1.005–1.030)
pH: 6 (ref 5.0–8.0)

## 2017-02-02 LAB — CBC
HCT: 33.5 % — ABNORMAL LOW (ref 36.0–46.0)
Hemoglobin: 10.7 g/dL — ABNORMAL LOW (ref 12.0–15.0)
MCH: 29.9 pg (ref 26.0–34.0)
MCHC: 31.9 g/dL (ref 30.0–36.0)
MCV: 93.6 fL (ref 78.0–100.0)
Platelets: 141 10*3/uL — ABNORMAL LOW (ref 150–400)
RBC: 3.58 MIL/uL — ABNORMAL LOW (ref 3.87–5.11)
RDW: 13.7 % (ref 11.5–15.5)
WBC: 11.1 10*3/uL — ABNORMAL HIGH (ref 4.0–10.5)

## 2017-02-02 LAB — COMPREHENSIVE METABOLIC PANEL
ALT: 16 U/L (ref 14–54)
AST: 28 U/L (ref 15–41)
Albumin: 3.3 g/dL — ABNORMAL LOW (ref 3.5–5.0)
Alkaline Phosphatase: 41 U/L (ref 38–126)
Anion gap: 10 (ref 5–15)
BUN: 25 mg/dL — ABNORMAL HIGH (ref 6–20)
CO2: 29 mmol/L (ref 22–32)
Calcium: 9.2 mg/dL (ref 8.9–10.3)
Chloride: 98 mmol/L — ABNORMAL LOW (ref 101–111)
Creatinine, Ser: 1.19 mg/dL — ABNORMAL HIGH (ref 0.44–1.00)
GFR calc Af Amer: 48 mL/min — ABNORMAL LOW (ref >60)
GFR calc non Af Amer: 42 mL/min — ABNORMAL LOW (ref >60)
Glucose, Bld: 154 mg/dL — ABNORMAL HIGH (ref 65–99)
Potassium: 3.5 mmol/L (ref 3.5–5.1)
Sodium: 137 mmol/L (ref 135–145)
Total Bilirubin: 1.3 mg/dL — ABNORMAL HIGH (ref 0.3–1.2)
Total Protein: 7.1 g/dL (ref 6.5–8.1)

## 2017-02-02 LAB — I-STAT CG4 LACTIC ACID, ED
Lactic Acid, Venous: 1.37 mmol/L (ref 0.5–1.9)
Lactic Acid, Venous: 2.17 mmol/L (ref 0.5–1.9)

## 2017-02-02 MED ORDER — CLOPIDOGREL BISULFATE 75 MG PO TABS
75.0000 mg | ORAL_TABLET | Freq: Every day | ORAL | Status: DC
  Administered 2017-02-02 – 2017-02-05 (×4): 75 mg via ORAL
  Filled 2017-02-02 (×4): qty 1

## 2017-02-02 MED ORDER — ONDANSETRON HCL 4 MG/2ML IJ SOLN
4.0000 mg | Freq: Four times a day (QID) | INTRAMUSCULAR | Status: DC | PRN
  Administered 2017-02-04: 4 mg via INTRAVENOUS
  Filled 2017-02-02: qty 2

## 2017-02-02 MED ORDER — MORPHINE SULFATE 15 MG PO TABS
15.0000 mg | ORAL_TABLET | Freq: Three times a day (TID) | ORAL | Status: DC | PRN
  Administered 2017-02-02 – 2017-02-03 (×2): 15 mg via ORAL
  Filled 2017-02-02 (×2): qty 1

## 2017-02-02 MED ORDER — GABAPENTIN 400 MG PO CAPS
800.0000 mg | ORAL_CAPSULE | Freq: Three times a day (TID) | ORAL | Status: DC
  Administered 2017-02-02 – 2017-02-05 (×10): 800 mg via ORAL
  Filled 2017-02-02 (×10): qty 2

## 2017-02-02 MED ORDER — ACETAMINOPHEN 650 MG RE SUPP: 650.0000 mg | Freq: Four times a day (QID) | RECTAL | Status: DC | PRN

## 2017-02-02 MED ORDER — ROSUVASTATIN CALCIUM 20 MG PO TABS
40.0000 mg | ORAL_TABLET | Freq: Every day | ORAL | Status: DC
  Administered 2017-02-02 – 2017-02-05 (×4): 40 mg via ORAL
  Filled 2017-02-02 (×2): qty 4
  Filled 2017-02-02: qty 2
  Filled 2017-02-02: qty 1
  Filled 2017-02-02 (×2): qty 2

## 2017-02-02 MED ORDER — GABAPENTIN 800 MG PO TABS: 800.0000 mg | ORAL_TABLET | Freq: Three times a day (TID) | ORAL | Status: DC

## 2017-02-02 MED ORDER — ONDANSETRON HCL 4 MG PO TABS: 4.0000 mg | ORAL_TABLET | Freq: Four times a day (QID) | ORAL | Status: DC | PRN

## 2017-02-02 MED ORDER — SODIUM CHLORIDE 0.9 % IV SOLN
INTRAVENOUS | Status: DC
  Administered 2017-02-02: 04:00:00 via INTRAVENOUS

## 2017-02-02 MED ORDER — DEXTROSE 5 % IV SOLN
1.0000 g | Freq: Every day | INTRAVENOUS | Status: DC
  Administered 2017-02-02 – 2017-02-04 (×3): 1 g via INTRAVENOUS
  Filled 2017-02-02 (×5): qty 10

## 2017-02-02 MED ORDER — ASPIRIN EC 81 MG PO TBEC
81.0000 mg | DELAYED_RELEASE_TABLET | Freq: Every day | ORAL | Status: DC
  Administered 2017-02-02 – 2017-02-05 (×4): 81 mg via ORAL
  Filled 2017-02-02 (×4): qty 1

## 2017-02-02 MED ORDER — ACETAMINOPHEN 325 MG PO TABS
650.0000 mg | ORAL_TABLET | Freq: Four times a day (QID) | ORAL | Status: DC | PRN
  Administered 2017-02-02 – 2017-02-04 (×4): 650 mg via ORAL
  Filled 2017-02-02 (×4): qty 2

## 2017-02-02 MED ORDER — ENOXAPARIN SODIUM 40 MG/0.4ML ~~LOC~~ SOLN
40.0000 mg | Freq: Every day | SUBCUTANEOUS | Status: DC
  Filled 2017-02-02: qty 0.4

## 2017-02-02 MED ORDER — DULOXETINE HCL 30 MG PO CPEP
30.0000 mg | ORAL_CAPSULE | Freq: Three times a day (TID) | ORAL | Status: DC
  Administered 2017-02-02 – 2017-02-05 (×10): 30 mg via ORAL
  Filled 2017-02-02 (×12): qty 1

## 2017-02-02 MED ORDER — MECLIZINE HCL 25 MG PO TABS: 25.0000 mg | ORAL_TABLET | Freq: Three times a day (TID) | ORAL | Status: DC | PRN

## 2017-02-02 NOTE — ED Provider Notes (Signed)
[facility] DEPT Provider Note   CSN: [id number] Arrival date & time: 02/01/17  2325     History   Chief Complaint Chief Complaint  Patient presents with  . Urinary Frequency    HPI [name] is a 83 y.o. female.  The history is provided by the patient.  Weakness  This is a new problem. The current episode started more than 2 days ago. The problem has been gradually worsening. There was no focality noted. The maximum temperature recorded prior to her arrival was 102 to 102.9 F. Pertinent negatives include no shortness of breath and no vomiting.  Patient presents with generalized weakness for 2-3 days She reports urinary frequency and foul smelling urine No HA No cough/sob No ABD pain She has h/o arthritis and has difficulty ambulating at baseline  Past Medical History:  Diagnosis Date  . [name] (abdominal aortic aneurysm) ([place]) 09/2013   3.6 cm  . Dementia   . Diabetes mellitus    pre-diabetes  . Diverticulosis   . Hyperlipidemia   . Hypertension   . Nephrolithiasis   . OSA on CPAP   . Osteoarthritis   . Osteopenia    T=  -2.1 in hip  . Rheumatoid arthritis(714.0)   . S/P carpal tunnel release   . Spinal stenosis     Patient Active Problem List   Diagnosis Date Noted  . Abnormal EKG 11/21/2013  . [name] (abdominal aortic aneurysm) ([place]) 09/30/2013  . OSA on CPAP 06/29/2012  . Dementia 06/29/2012  . Pre-diabetes 06/29/2012  . Hypoxia, sleep related 06/29/2012  . RA (rheumatoid arthritis) ([place]) 06/29/2012  . Nonspecific abnormal results of cardiovascular function study 06/02/2011  . Nonspecific abnormal electrocardiogram (ECG) (EKG) 05/18/2011  . OSTEOPOROSIS 04/09/2008  . FLANK PAIN, RIGHT 03/04/2008  . HYPERLIPIDEMIA 05/12/2006  . PYELONEPHRITIS, ACUTE 05/12/2006  . OSTEOARTHRITIS 05/12/2006  . DEPENDENT EDEMA, LEGS, BILATERAL 05/12/2006  . URINARY INCONTINENCE 05/12/2006  . PERIPHERAL NEUROPATHY 03/11/2006  . Essential hypertension 03/11/2006  .  DYSURIA 03/11/2006    Past Surgical History:  Procedure Laterality Date  . APPENDECTOMY    . CARPAL TUNNEL RELEASE  12/27/2006   Right subcutaneous ulnar nerve transfer -- Right open carpal tunnel release  . CHOLECYSTECTOMY    . FOOT SURGERY    . JOINT REPLACEMENT     BTKR, RTHR  . TOTAL KNEE ARTHROPLASTY  08/21/2007   Right total knee replacement    OB History    No data available       Home Medications    Prior to Admission medications   Medication Sig Start Date End Date Taking? Authorizing Provider  aspirin EC 81 MG tablet Take 1 tablet (81 mg total) by mouth daily. 06/02/11   Lelon Perla, MD  atenolol (TENORMIN) 25 MG tablet TAKE 1 TABLET(25 MG) BY MOUTH DAILY 07/13/16   [name], MD  clopidogrel (PLAVIX) 75 MG tablet Take 1 tablet (75 mg total) by mouth daily. 01/05/17   [name], MD  DULoxetine (CYMBALTA) 30 MG capsule Take 30 mg by mouth 3 (three) times daily.  07/29/14   [provider]  gabapentin (NEURONTIN) 800 MG tablet TAKE 1 TABLET BY MOUTH EVERY MORNING AND AT LUNCH, 2 TABLETS BY MOUTH EVERY NIGHT AT BEDTIME 10/05/16   Hyatt, Max T, DPM  hydrochlorothiazide (HYDRODIURIL) 25 MG tablet TAKE 1 TABLET BY MOUTH EVERY DAY 03/09/16   [name], MD  meclizine (ANTIVERT) 25 MG tablet Take 1 tablet (25 mg total) by  mouth 3 (three) times daily as needed for dizziness. 12/22/16   [name], MD  oxymorphone (OPANA) 5 MG tablet Take 5 mg by mouth 3 (three) times daily.    [provider]  rosuvastatin (CRESTOR) 40 MG tablet Take 1 tablet (40 mg total) by mouth daily. 01/05/17   [name], MD    Family History Family History  Problem Relation Age of Onset  . Stroke Father   . Coronary artery disease Son        CABG at age 92  . Heart disease Son   . Heart attack Son   . Stroke Mother   . Hypertension Sister   . Diabetes Brother   . Hypertension Maternal Aunt     Social History Social History  Substance Use  Topics  . Smoking status: Never Smoker  . Smokeless tobacco: Never Used  . Alcohol use No     Allergies   Sulfonamide derivatives   Review of Systems Review of Systems  Constitutional: Positive for fatigue and fever.  Respiratory: Negative for shortness of breath.   Gastrointestinal: Negative for vomiting.  Genitourinary: Positive for frequency.  Neurological: Positive for weakness.  All other systems reviewed and are negative.    Physical Exam Updated Vital Signs BP 123/65 (BP Location: Right Arm)   Pulse 86   Temp (!) 102.8 F (39.3 C)   Resp (!) 23   SpO2 91%   Physical Exam CONSTITUTIONAL: Elderly, frail HEAD: Normocephalic/atraumatic EYES: EOMI/PERRL ENMT: Mucous membranes moist NECK: supple no meningeal signs SPINE/BACK:entire spine nontender CV: S1/S2 noted, no murmurs/rubs/gallops noted LUNGS: crackles in bilateral bases, no distress noted ABDOMEN: soft, nontender, no rebound or guarding, bowel sounds noted throughout abdomen GU:no cva tenderness NEURO: Pt is awake/alert/appropriate, moves all extremitiesx4.  No arm/leg drift EXTREMITIES: pulses normal/equal, full ROM SKIN: warm, color normal PSYCH: no abnormalities of mood noted, alert and oriented to situation   ED Treatments / Results  Labs (all labs ordered are listed, but only abnormal results are displayed) Labs Reviewed  COMPREHENSIVE METABOLIC PANEL - Abnormal; Notable for the following:       Result Value   Chloride 98 (*)    Glucose, Bld 154 (*)    BUN 25 (*)    Creatinine, Ser 1.19 (*)    Albumin 3.3 (*)    Total Bilirubin 1.3 (*)    GFR calc non Af Amer 42 (*)    GFR calc Af Amer 48 (*)    All other components within normal limits  CBC WITH DIFFERENTIAL/PLATELET - Abnormal; Notable for the following:    WBC 14.3 (*)    Neutro Abs 11.4 (*)    Monocytes Absolute 1.4 (*)    All other components within normal limits  URINALYSIS, ROUTINE W REFLEX MICROSCOPIC - Abnormal; Notable for  the following:    APPearance CLOUDY (*)    Hgb urine dipstick MODERATE (*)    Protein, ur 100 (*)    Nitrite POSITIVE (*)    Leukocytes, UA LARGE (*)    Bacteria, UA MANY (*)    Squamous Epithelial / LPF 0-5 (*)    All other components within normal limits  I-STAT CG4 LACTIC ACID, ED - Abnormal; Notable for the following:    Lactic Acid, Venous 2.17 (*)    All other components within normal limits  CULTURE, BLOOD (ROUTINE X 2)  CULTURE, BLOOD (ROUTINE X 2)  URINE CULTURE    EKG  EKG Interpretation  Date/Time:  Wednesday  February 01 2017 23:36:43 EDT Ventricular Rate:  85 PR Interval:    QRS Duration: 81 QT Interval:  336 QTC Calculation: 400 R Axis:   -2 Text Interpretation:  Sinus rhythm Low voltage, precordial leads No significant change since last tracing Confirmed by Ripley Fraise ([id number]) on 02/01/2017 11:47:14 PM       Radiology Dg Chest Port 1 View  Result Date: 02/02/2017 CLINICAL DATA:  Foul smelling urine with burning sensation. Fever. Weakness for 3 days. EXAM: PORTABLE CHEST 1 VIEW COMPARISON:  06/17/2016 FINDINGS: Normal heart size and pulmonary vascularity. No airspace disease or consolidation in the lungs. No blunting of costophrenic angles. No pneumothorax. Mediastinal contours appear intact. Calcified and tortuous aorta. IMPRESSION: No evidence of active pulmonary disease.  Aortic atherosclerosis. Electronically Signed   By: Lucienne Capers M.D.   On: 02/02/2017 00:28    Procedures Procedures    Medications Ordered in ED Medications  cefTRIAXone (ROCEPHIN) 1 g in dextrose 5 % 50 mL IVPB (not administered)  acetaminophen (TYLENOL) tablet 650 mg (not administered)    Or  acetaminophen (TYLENOL) suppository 650 mg (not administered)  ondansetron (ZOFRAN) tablet 4 mg (not administered)    Or  ondansetron (ZOFRAN) injection 4 mg (not administered)  enoxaparin (LOVENOX) injection 40 mg (not administered)  aspirin EC tablet 81 mg (not administered)    clopidogrel (PLAVIX) tablet 75 mg (not administered)  rosuvastatin (CRESTOR) tablet 40 mg (not administered)  oxymorphone (OPANA) tablet 5 mg (not administered)  meclizine (ANTIVERT) tablet 25 mg (not administered)  DULoxetine (CYMBALTA) DR capsule 30 mg (not administered)  gabapentin (NEURONTIN) tablet 800-1,600 mg (not administered)  sodium chloride 0.9 % bolus 1,000 mL (0 mLs Intravenous Stopped 02/02/17 0138)    And  sodium chloride 0.9 % bolus 1,000 mL (0 mLs Intravenous Stopped 02/02/17 0155)    And  sodium chloride 0.9 % bolus 500 mL (500 mLs Intravenous New Bag/Given 02/02/17 0155)  piperacillin-tazobactam (ZOSYN) IVPB 3.375 g (0 g Intravenous Stopped 02/02/17 0104)  vancomycin (VANCOCIN) IVPB 1000 mg/200 mL premix (0 mg Intravenous Stopped 02/02/17 0138)     Initial Impression / Assessment and Plan / ED Course  I have reviewed the triage vital signs and the nursing notes.  Pertinent labs & imaging results that were available during my care of the patient were reviewed by me and considered in my medical decision making (see chart for details).     Pt found to have UTI, early sepsis She is in no distress Due to age/debility and likelihood for decompensation, advised admission D/w dr gardner for admission   Final Clinical Impressions(s) / ED Diagnoses   Final diagnoses:  Dehydration  Acute cystitis without hematuria  Sepsis, due to unspecified organism [facility])    New Prescriptions New Prescriptions   No medications on file     Ripley Fraise, MD 02/02/17 0215

## 2017-02-02 NOTE — Progress Notes (Signed)
  PROGRESS NOTE  [name] QAS:[id number] DOB: [birth year] DOA: 02/01/2017 PCP: [name], MD  Brief Narrative: 83 year old woman PMH dementia, diabetes mellituspresented to the emergency department with fever, foul-smelling urine. Admitted for UTI with sepsis.  Assessment/Plan UTI with sepsis with associated thrombocytopenia and anemia of acute illness. -hemodynamics stable, lactic acid has normalized. Continue ceftriaxone. Follow-up culture data.  Prediabetes -diet controlled, glucose stable. Supportive care.  Essential hypertension -stable, continue atenolol, resume HCTZ tomorrow  Chronic pain syndrome -at baseline. Continue oxymorphone substitute (morphine, gabapentin)  Hyperlipidemia -continue rosuvastatin  Obstructive sleep apnea -CPAP QHS  DVT prophylaxis: SCDs Code Status: full Family Communication: daughter at bedside Disposition Plan: home    Murray Hodgkins, MD  [facility] Direct contact: [phone] --[address]ia [place]  --[email or web address]; password [email or web address]  7PM-7AM contact night coverage as above 02/02/2017, 9:29 AM  LOS: 0 days   Consultants:    Procedures:    Antimicrobials:  Ceftriaxone 10/3 >>  Interval history/Subjective: Feels ok, no new pain, only chronic pain. Breathing ok.  Objective: Vitals: 102.8, 99.8, 21, 110/55  Exam:     Constitutional: appears tired, mildly uncomfortable, nontoxic. Calm.  Eyes. Pupils, irises, lids appear unremarkable.  ENT. Grossly normal hearing, lips, tongue.  Respiratory. Clear to auscultation bilaterally. No wheezes, rales or rhonchi. Normal respiratory effort.  Cardiovascular. Regular rate and rhythm. No murmur, rub or gallop. No lower extremity edema.  Abdomen. Soft, nontender, nondistended.  Psychiatric. Grossly normal mood and affect. Speech fluent and appropriate.   I have personally reviewed the following:   Labs:  Basic metabolic panel unremarkable, LFTs  unremarkable  Lactic acid has normalized, 1.37  Platelets down, 141  WBC improved, 11.1. Hemoglobin trending down, 10.7.  Urinalysis grossly abnormal  Imaging studies:  Chest x-ray no acute disease, independently reviewed  Medical tests:  EKG independently reviewed, sinus rhythm, no acute changes    Scheduled Meds: . aspirin EC  81 mg Oral Daily  . clopidogrel  75 mg Oral Daily  . DULoxetine  30 mg Oral TID  . gabapentin  800 mg Oral TID  . rosuvastatin  40 mg Oral Daily   Continuous Infusions: . cefTRIAXone (ROCEPHIN)  IV Stopped (02/02/17 7408)    Principal Problem:   Sepsis secondary to UTI [facility]) Active Problems:   Hyperlipidemia   Essential hypertension   OSA (obstructive sleep apnea)   Chronic pain syndrome   LOS: 0 days

## 2017-02-02 NOTE — Progress Notes (Signed)
[name] [id number] Admission Data: 02/02/2017 7:38 PM Attending Provider: Samuella Cota, MD  RSW:[email or web address], Yvonne Mcgee, MD Consults/ Treatment Team:   [name] is a 83 y.o. female patient admitted from ED awake, alert  & orientated  X 3,  Full Code, VSS - Blood pressure (!) 116/57, pulse 72, temperature 98.2 F (36.8 C), temperature source Oral, resp. rate 16, height 5\' 4"  (1.626 m), SpO2 95 %., O2    1 L nasal cannular, no c/o shortness of breath, no c/o chest pain, no distress noted. Tele 7 placed and pt is currently running:normal sinus rhythm.   IV site WDL:  antecubital left, condition patent and no redness with a transparent dsg that's clean dry and intact.  Allergies:   Allergies  Allergen Reactions  . Sulfonamide Derivatives Hives and Rash     Past Medical History:  Diagnosis Date  . [name] (abdominal aortic aneurysm) ([place]) 09/2013   3.6 cm  . Dementia   . Diabetes mellitus    pre-diabetes  . Diverticulosis   . Hyperlipidemia   . Hypertension   . Nephrolithiasis   . OSA on CPAP   . Osteoarthritis   . Osteopenia    T=  -2.1 in hip  . Rheumatoid arthritis(714.0)   . S/P carpal tunnel release   . Spinal stenosis     History:  obtained from chart review.   Pt orientation to unit, room and routine.  Admission INP armband ID verified with patient/family, and in place. SR up x 2, fall risk assessment complete with Patient and family verbalizing understanding of risks associated with falls. Pt verbalizes an understanding of how to use the call bell and to call for help before getting out of bed.   Will cont to monitor and assist as needed.  Eliot Ford, RN 02/02/2017 7:38 PM

## 2017-02-02 NOTE — ED Notes (Signed)
Patient c/o lower back hurting at sacral area, area slightly red. Sacrum pad applied. And patient repositioned in bed. daugher at bedside.

## 2017-02-02 NOTE — H&P (Signed)
History and Physical    [name] ZOX:[id number] DOB: 1934-04-04 DOA: 02/01/2017  PCP: [name], MD  Patient coming from: Home  I have personally briefly reviewed patient's old medical records in [place]  Chief Complaint: Urinary frequency  HPI: [name] is a 83 y.o. female with medical history significant of HTN, chronic pain, [name].  Patient presents to the ED with c/o urinary frequency and foul smelling urine.  Tm at home 102.9.  No SOB, no cough, no vomiting, no headache, no abd pain.  Has difficulty with ambulation at baseline due to arthritis.  No flank pain.  Does have chronic back pain, but this is no worse than it has been for years, not different or new in any way patient reports.   ED Course: Tm 102.8, WBC 14.3, lactate 2.1.  UA is impressively positive with large leuks, positive nitrites, mod HGB, too many to count WBCs, many bacteria.  Patient was initially given 2.5L bolus, zosyn / vanc in ED for sepsis protocol.   Review of Systems: As per HPI otherwise 10 point review of systems negative.   Past Medical History:  Diagnosis Date  . [name] (abdominal aortic aneurysm) ([place]) 09/2013   3.6 cm  . Dementia   . Diabetes mellitus    pre-diabetes  . Diverticulosis   . Hyperlipidemia   . Hypertension   . Nephrolithiasis   . OSA on CPAP   . Osteoarthritis   . Osteopenia    T=  -2.1 in hip  . Rheumatoid arthritis(714.0)   . S/P carpal tunnel release   . Spinal stenosis     Past Surgical History:  Procedure Laterality Date  . APPENDECTOMY    . CARPAL TUNNEL RELEASE  12/27/2006   Right subcutaneous ulnar nerve transfer -- Right open carpal tunnel release  . CHOLECYSTECTOMY    . FOOT SURGERY    . JOINT REPLACEMENT     BTKR, RTHR  . TOTAL KNEE ARTHROPLASTY  08/21/2007   Right total knee replacement     reports that she has never smoked. She has never used smokeless tobacco. She reports that she does not drink alcohol or use  drugs.  Allergies  Allergen Reactions  . Sulfonamide Derivatives Hives and Rash    Family History  Problem Relation Age of Onset  . Stroke Father   . Coronary artery disease Son        CABG at age 73  . Heart disease Son   . Heart attack Son   . Stroke Mother   . Hypertension Sister   . Diabetes Brother   . Hypertension Maternal Aunt      Prior to Admission medications   Medication Sig Start Date End Date Taking? Authorizing Provider  aspirin EC 81 MG tablet Take 1 tablet (81 mg total) by mouth daily. 06/02/11  Yes Lelon Perla, MD  atenolol (TENORMIN) 25 MG tablet TAKE 1 TABLET(25 MG) BY MOUTH DAILY 07/13/16  Yes [name], MD  clopidogrel (PLAVIX) 75 MG tablet Take 1 tablet (75 mg total) by mouth daily. 01/05/17  Yes [name], MD  DULoxetine (CYMBALTA) 30 MG capsule Take 30 mg by mouth 3 (three) times daily.  07/29/14  Yes [provider]  gabapentin (NEURONTIN) 800 MG tablet TAKE 1 TABLET BY MOUTH EVERY MORNING AND AT LUNCH, 2 TABLETS BY MOUTH EVERY NIGHT AT BEDTIME 10/05/16  Yes Hyatt, Max T, DPM  hydrochlorothiazide (HYDRODIURIL) 25 MG tablet TAKE 1 TABLET BY  MOUTH EVERY DAY 03/09/16  Yes [name], MD  meclizine (ANTIVERT) 25 MG tablet Take 1 tablet (25 mg total) by mouth 3 (three) times daily as needed for dizziness. 12/22/16  Yes [name], MD  oxymorphone (OPANA) 5 MG tablet Take 5 mg by mouth 3 (three) times daily.   Yes [provider]  rosuvastatin (CRESTOR) 40 MG tablet Take 1 tablet (40 mg total) by mouth daily. 01/05/17  Yes [name], MD    Physical Exam: Vitals:   02/02/17 0130 02/02/17 0145 02/02/17 0200 02/02/17 0220  BP: 106/60 (!) 105/58 (!) 106/54   Pulse: 75 74 83   Resp: 19 20 (!) 21   Temp:    (S) 99.8 F (37.7 C)  TempSrc:    Rectal  SpO2: 92% 93% 91%     Constitutional: NAD, calm, comfortable Eyes: PERRL, lids and conjunctivae normal ENMT: Mucous membranes are moist. Posterior pharynx clear  of any exudate or lesions.Normal dentition.  Neck: normal, supple, no masses, no thyromegaly Respiratory: clear to auscultation bilaterally, no wheezing, no crackles. Normal respiratory effort. No accessory muscle use.  Cardiovascular: Regular rate and rhythm, no murmurs / rubs / gallops. No extremity edema. 2+ pedal pulses. No carotid bruits.  Abdomen: no tenderness, no masses palpated. No hepatosplenomegaly. Bowel sounds positive.  Musculoskeletal: no clubbing / cyanosis. No joint deformity upper and lower extremities. Good ROM, no contractures. Normal muscle tone.  Skin: no rashes, lesions, ulcers. No induration Neurologic: CN 2-12 grossly intact. Sensation intact, DTR normal. Strength 5/5 in all 4.  Psychiatric: Normal judgment and insight. Alert and oriented x 3. Normal mood.    Labs on Admission: I have personally reviewed following labs and imaging studies  CBC:  Recent Labs Lab 02/01/17 0028  WBC 14.3*  NEUTROABS 11.4*  HGB 12.1  HCT 38.0  MCV 93.1  PLT 409   Basic Metabolic Panel:  Recent Labs Lab 02/01/17 0028  NA 137  K 3.5  CL 98*  CO2 29  GLUCOSE 154*  BUN 25*  CREATININE 1.19*  CALCIUM 9.2   GFR: CrCl cannot be calculated (Unknown ideal weight.). Liver Function Tests:  Recent Labs Lab 02/01/17 0028  AST 28  ALT 16  ALKPHOS 41  BILITOT 1.3*  PROT 7.1  ALBUMIN 3.3*   No results for input(s): LIPASE, AMYLASE in the last 168 hours. No results for input(s): AMMONIA in the last 168 hours. Coagulation Profile: No results for input(s): INR, PROTIME in the last 168 hours. Cardiac Enzymes: No results for input(s): CKTOTAL, CKMB, CKMBINDEX, TROPONINI in the last 168 hours. BNP (last 3 results) No results for input(s): PROBNP in the last 8760 hours. HbA1C: No results for input(s): HGBA1C in the last 72 hours. CBG: No results for input(s): GLUCAP in the last 168 hours. Lipid Profile: No results for input(s): CHOL, HDL, LDLCALC, TRIG, CHOLHDL,  LDLDIRECT in the last 72 hours. Thyroid Function Tests: No results for input(s): TSH, T4TOTAL, FREET4, T3FREE, THYROIDAB in the last 72 hours. Anemia Panel: No results for input(s): VITAMINB12, FOLATE, FERRITIN, TIBC, IRON, RETICCTPCT in the last 72 hours. Urine analysis:    Component Value Date/Time   COLORURINE YELLOW 02/02/2017 0103   APPEARANCEUR CLOUDY (A) 02/02/2017 0103   LABSPEC 1.016 02/02/2017 0103   PHURINE 6.0 02/02/2017 0103   GLUCOSEU NEGATIVE 02/02/2017 0103   GLUCOSEU NEG mg/dL 03/08/2007 2031   HGBUR MODERATE (A) 02/02/2017 0103   HGBUR negative 03/06/2008 0846   BILIRUBINUR NEGATIVE 02/02/2017 0103  KETONESUR NEGATIVE 02/02/2017 0103   PROTEINUR 100 (A) 02/02/2017 0103   UROBILINOGEN 0.2 09/20/2013 1535   NITRITE POSITIVE (A) 02/02/2017 0103   LEUKOCYTESUR LARGE (A) 02/02/2017 0103    Radiological Exams on Admission: Dg Chest Port 1 View  Result Date: 02/02/2017 CLINICAL DATA:  Foul smelling urine with burning sensation. Fever. Weakness for 3 days. EXAM: PORTABLE CHEST 1 VIEW COMPARISON:  06/17/2016 FINDINGS: Normal heart size and pulmonary vascularity. No airspace disease or consolidation in the lungs. No blunting of costophrenic angles. No pneumothorax. Mediastinal contours appear intact. Calcified and tortuous aorta. IMPRESSION: No evidence of active pulmonary disease.  Aortic atherosclerosis. Electronically Signed   By: Lucienne Capers M.D.   On: 02/02/2017 00:28    EKG: Independently reviewed.  Assessment/Plan Principal Problem:   Sepsis secondary to UTI [facility]) Active Problems:   Essential hypertension    1. Sepsis secondary to UTI - 1. Narrow ABx to rocephin 2. UCx pending 3. BCx pending 4. Repeat CBC and BMP in AM 5. I dont really have an indication at this point in time to obtain imaging (no symptoms of flank pain, etc), but if she worsens, develops these, or doesn't improve then would need to obtain imaging. 6. IVF: got 2.5L bolus in ED, and  will put on 75 cc/hr NS 2. HTN - 1. Holding home BP meds due to borderline BPs in ED 3. Chronic pain - 1. Continue home meds 2. Neurontin was recently decreased to 800 TID from my discussion with patient it seems.  Looks like her PCP wanted it down to 400, 400, and 800 even based on his notes. 3. Opana TID PRN 4. OSA - patient NOT on CPAP at home per her and family  DVT prophylaxis: Lovenox Code Status: Full Family Communication: Family at bedside Disposition Plan: Home after admit Consults called: None Admission status: Admit to inpatient - inpatient status for management of sepsis   Etta Quill DO [facility] Pager [phone]  If 7AM-7PM, please contact day team taking care of patient [email or web address] Password [email or web address]  02/02/2017, 2:21 AM

## 2017-02-03 ENCOUNTER — Inpatient Hospital Stay (HOSPITAL_COMMUNITY): Payer: Medicare Other

## 2017-02-03 DIAGNOSIS — R9431 Abnormal electrocardiogram [ECG] [EKG]: Secondary | ICD-10-CM

## 2017-02-03 DIAGNOSIS — R000 Tachycardia, unspecified: Secondary | ICD-10-CM

## 2017-02-03 LAB — CBC
HCT: 38.6 % (ref 36.0–46.0)
Hemoglobin: 12.1 g/dL (ref 12.0–15.0)
MCH: 29 pg (ref 26.0–34.0)
MCHC: 31.3 g/dL (ref 30.0–36.0)
MCV: 92.6 fL (ref 78.0–100.0)
Platelets: 142 10*3/uL — ABNORMAL LOW (ref 150–400)
RBC: 4.17 MIL/uL (ref 3.87–5.11)
RDW: 13.4 % (ref 11.5–15.5)
WBC: 13.6 10*3/uL — ABNORMAL HIGH (ref 4.0–10.5)

## 2017-02-03 LAB — BASIC METABOLIC PANEL
Anion gap: 7 (ref 5–15)
BUN: 14 mg/dL (ref 6–20)
CO2: 24 mmol/L (ref 22–32)
Calcium: 8.2 mg/dL — ABNORMAL LOW (ref 8.9–10.3)
Chloride: 101 mmol/L (ref 101–111)
Creatinine, Ser: 0.8 mg/dL (ref 0.44–1.00)
GFR calc Af Amer: >60 mL/min (ref >60)
GFR calc non Af Amer: >60 mL/min (ref >60)
Glucose, Bld: 116 mg/dL — ABNORMAL HIGH (ref 65–99)
Potassium: 3.5 mmol/L (ref 3.5–5.1)
Sodium: 132 mmol/L — ABNORMAL LOW (ref 135–145)

## 2017-02-03 LAB — ECHOCARDIOGRAM COMPLETE
Height: 64 in
Weight: 2912 oz

## 2017-02-03 LAB — MAGNESIUM: Magnesium: 1.5 mg/dL — ABNORMAL LOW (ref 1.7–2.4)

## 2017-02-03 MED ORDER — MAGNESIUM SULFATE 2 GM/50ML IV SOLN
2.0000 g | Freq: Once | INTRAVENOUS | Status: AC
  Administered 2017-02-03: 2 g via INTRAVENOUS
  Filled 2017-02-03: qty 50

## 2017-02-03 MED ORDER — POTASSIUM CHLORIDE 20 MEQ PO PACK
40.0000 meq | PACK | Freq: Two times a day (BID) | ORAL | Status: AC
  Administered 2017-02-03 (×2): 40 meq via ORAL
  Filled 2017-02-03 (×2): qty 2

## 2017-02-03 MED ORDER — DIGOXIN 0.25 MG/ML IJ SOLN
0.2500 mg | Freq: Once | INTRAMUSCULAR | Status: AC
  Administered 2017-02-03: 0.25 mg via INTRAVENOUS
  Filled 2017-02-03: qty 2

## 2017-02-03 MED ORDER — SODIUM CHLORIDE 0.9 % IV BOLUS (SEPSIS)
500.0000 mL | Freq: Once | INTRAVENOUS | Status: AC
  Administered 2017-02-03: 500 mL via INTRAVENOUS

## 2017-02-03 MED ORDER — DILTIAZEM HCL 25 MG/5ML IV SOLN
10.0000 mg | Freq: Once | INTRAVENOUS | Status: AC
  Administered 2017-02-03: 10 mg via INTRAVENOUS
  Filled 2017-02-03: qty 5

## 2017-02-03 MED ORDER — ENOXAPARIN SODIUM 100 MG/ML ~~LOC~~ SOLN
1.0000 mg/kg | Freq: Two times a day (BID) | SUBCUTANEOUS | Status: DC
  Administered 2017-02-03 – 2017-02-05 (×5): 85 mg via SUBCUTANEOUS
  Filled 2017-02-03 (×5): qty 1

## 2017-02-03 MED ORDER — METOPROLOL TARTRATE 5 MG/5ML IV SOLN
5.0000 mg | Freq: Once | INTRAVENOUS | Status: AC
  Administered 2017-02-03: 5 mg via INTRAVENOUS

## 2017-02-03 MED ORDER — METOPROLOL TARTRATE 5 MG/5ML IV SOLN
INTRAVENOUS | Status: AC
  Filled 2017-02-03: qty 5

## 2017-02-03 NOTE — Progress Notes (Addendum)
Called by RN about patient being in  AF RVR, HR 120-140s, prior to this HR was in the 70-80s, patient does have history of AF.    Upon arrival, patient was alert, complaining only of a HA, lung sounds clear, skin warm/dry, denied CP/SOB.  RN paged TRH NP, orders received for Lopressor 5mg  IV and 500 NS Bolus.   Both given, no improvement in HR, order received for 0.25mg  Digoxin IV, given at 0549. After digoxin, HR was in the 110-120 range, I asked the RN to inform the provider.  I was called away to another call. I came back at 30, RN said the provider will come to bedside. Provider arrived, assess patient, ordered Diltiazem 10mg  IV, given at 657, BP 109/63 (70).  Will follow.  End Time 705

## 2017-02-03 NOTE — Progress Notes (Signed)
Pt did not wear CPAP. Rt will continue to monitor.

## 2017-02-03 NOTE — Progress Notes (Signed)
  Echocardiogram 2D Echocardiogram has been performed.  [name] M 02/03/2017, 2:42 PM

## 2017-02-03 NOTE — Progress Notes (Signed)
New onset A.fib noted by telemetry.  MD notified and rapid response called. BP 118/69, Temp- 98.6, RR- 18, O2 95% on 1L.  Orders given for stat EKG, 5 mg IV lopressor and 500 ml bolus.  Pt C/O headache. Denies chest pain and shortness of breath.

## 2017-02-03 NOTE — Progress Notes (Signed)
  PROGRESS NOTE  TANJA GIFT KXF:[id number] DOB: [birth year] DOA: 02/01/2017 PCP: [name], MD  Brief Narrative: 83 year old woman PMH dementia, diabetes mellituspresented to the emergency department with fever, foul-smelling urine. Admitted for UTI with sepsis.  Assessment/Plan E coli UTI with sepsis with associated thrombocytopenia and anemia of acute illness. -hemodynamics are stable. Continue empiric antibiotic. Follow-up culture data.  Narrow complex irregular tachycardiain the setting of sepsis, not clearly atrial fibrillation. Differential as usual: Sinus tachycardia with ectopy, ectopic rhythm, atrial fibrillation/flutter. -completely asymptomatic. -replace magnesium and potassium -Continue telemetry. Check TSH and echocardiogram. Repeat EKG for rhythm changes and in the morning. Empiric Lovenox.  Prediabetes -diet controlled, fasting blood sugar 116.Supportive care. No indication for blood sugar checks.  Essential hypertension -remains stable, increase atenolol for rate control  Chronic pain syndrome -stable. Continue morphine, gabapentin  Hyperlipidemia -continue Crestor  Obstructive sleep apnea -CPAP QHS  Aortic atherosclerosis  -symptomatic. Continue statin. Follow-up as an outpatient.  Not ready for discharge, continue empiric antibiotic, follow-up culture data, work up tachycardia further.  DVT prophylaxis: SCDs Code Status: full Family Communication:  Disposition Plan: home    Murray Hodgkins, MD  [facility] Direct contact: [phone] --Via [place]  --[email or web address]; password [email or web address]  7PM-7AM contact night coverage as above 02/03/2017, 10:29 AM  LOS: 1 day   Consultants:    Procedures:    Antimicrobials:  Ceftriaxone 10/3 >>  Interval history/Subjective: New onset afib/flutter overnight.  No chest pain, no SOB, no palpitations. Feels ok.  Objective: Vitals: afebrile 24 hours, VSS other than tachycardia. 98.8, 18,  104, 132/74, 95% on [place]  Exam:     Constitutional. Appears calm and comfortable  Eyes. Pupils, irises, lids appear unremarkable  ENT. Grossly normal hearing, lips, tongue  CV. Irregular, tachycardic, no m/r/g. No LE edema.telemetry narrow complex tachycardia with frequent PVCs.  Resp. CTA bilaterally. No wheezes, rales or rhonchi. Normal respiratory effort.  Musculoskeletal. Grossly normal tone all extremities.  Psychiatric. Grossly normal mood and affect. Speech fluent and appropriate.   I have personally reviewed the following:   Labs:  Creatinine within normal limits, 0.08  WBC without significant change, 13.6. Platelets stable 142. Hemoglobin stable 12.1.  UC noted  Imaging studies:    Medical tests:  EKG independently reviewed:narrow complex tachycardia, irregular, some P waves seen, sinus tachycardia versus ectopic rhythm versus A. Fib/flutter.   Scheduled Meds: . aspirin EC  81 mg Oral Daily  . clopidogrel  75 mg Oral Daily  . DULoxetine  30 mg Oral TID  . enoxaparin (LOVENOX) injection  1 mg/kg Subcutaneous Q12H  . gabapentin  800 mg Oral TID  . metoprolol tartrate      . potassium chloride  40 mEq Oral BID  . rosuvastatin  40 mg Oral Daily   Continuous Infusions: . cefTRIAXone (ROCEPHIN)  IV 1 g (02/03/17 1007)  . magnesium sulfate 1 - 4 g bolus IVPB      Principal Problem:   Sepsis secondary to UTI [facility]) Active Problems:   Hyperlipidemia   Essential hypertension   OSA (obstructive sleep apnea)   Chronic pain syndrome   Tachycardia   LOS: 1 day

## 2017-02-03 NOTE — Progress Notes (Signed)
PT refused to wear CPAP.  RT will continue to monitor.  

## 2017-02-03 NOTE — Progress Notes (Deleted)
Called by RN about patient being in AFIB RVR, HR 120-140s, prior to this HR was in the 70-80s, patient does not have a history.   Upon arrival, patient was alert, complaining of a headache, otherwise no other pain.  Lung sounds clear and other vitals stable.  RN paged TRH NP, orders received for Lopressor 5mg  IV and 500cc NS bolus.   Lopressor 5mg  IV and 500cc bolus given, HR still in the 130s, ordered received for digoxin 0.25.   SBP remained 120-130s, MAP > 75, skin warm, dry, patient was given APAP for headache as well

## 2017-02-04 DIAGNOSIS — I4892 Unspecified atrial flutter: Secondary | ICD-10-CM

## 2017-02-04 LAB — BASIC METABOLIC PANEL
Anion gap: 11 (ref 5–15)
BUN: 14 mg/dL (ref 6–20)
CO2: 23 mmol/L (ref 22–32)
Calcium: 8.5 mg/dL — ABNORMAL LOW (ref 8.9–10.3)
Chloride: 98 mmol/L — ABNORMAL LOW (ref 101–111)
Creatinine, Ser: 0.78 mg/dL (ref 0.44–1.00)
GFR calc Af Amer: >60 mL/min (ref >60)
GFR calc non Af Amer: >60 mL/min (ref >60)
Glucose, Bld: 112 mg/dL — ABNORMAL HIGH (ref 65–99)
Potassium: 4.3 mmol/L (ref 3.5–5.1)
Sodium: 132 mmol/L — ABNORMAL LOW (ref 135–145)

## 2017-02-04 LAB — URINE CULTURE: Culture: >=100000 — AB

## 2017-02-04 LAB — CBC
HCT: 38.5 % (ref 36.0–46.0)
Hemoglobin: 12.5 g/dL (ref 12.0–15.0)
MCH: 29.4 pg (ref 26.0–34.0)
MCHC: 32.5 g/dL (ref 30.0–36.0)
MCV: 90.6 fL (ref 78.0–100.0)
Platelets: 181 10*3/uL (ref 150–400)
RBC: 4.25 MIL/uL (ref 3.87–5.11)
RDW: 13 % (ref 11.5–15.5)
WBC: 12.2 10*3/uL — ABNORMAL HIGH (ref 4.0–10.5)

## 2017-02-04 LAB — TSH: TSH: 3.485 u[IU]/mL (ref 0.350–4.500)

## 2017-02-04 LAB — MAGNESIUM: Magnesium: 2 mg/dL (ref 1.7–2.4)

## 2017-02-04 MED ORDER — ATENOLOL 50 MG PO TABS
25.0000 mg | ORAL_TABLET | Freq: Every day | ORAL | Status: DC
  Administered 2017-02-04 – 2017-02-05 (×2): 25 mg via ORAL
  Filled 2017-02-04 (×2): qty 1

## 2017-02-04 NOTE — Progress Notes (Signed)
Shift event note:  Notified by RN at approx 0530 regarding pt having rhythm change. Pt completely asymptomatic. RR RN was paged and responded to bedside. EKG obtained and suggest A-Fib/RVR w/ rate of 130. Pt denies CP, SOB or other symptoms. HR somewhat improved w/ IV Lopressor, IVFB and IV dig. RN re-paged to report HR remains 105-120's. At bedside pt noted resting in bed in NAD. She is awake, oriented and does report h/a but denies CP or other sx's. Denies ever being told she had an abnormal heart rhyhtm.  HR currently low 105-115 and on further review of telemetry rhythm it is unclear if a-fib/rvr vs other tachy rhythm ( +/- ectopy)  as some complexes appear to have p waves. BBS essentially CTA. Remaining VSS. Assessment/Plan: 1.  Rhythm Change: A-fib/RVR vs ST +/- ectopy.  Asymptomatic. Some improvement in HR w/ IV Lopressor/IVFB and IV dig. HR remains 115-120's. Will give Cardizem 10 mg IVP. Will monitor HR for improvement after IV Cardizem. Changes in pt's status have been communicated to oncoming MD. Rounding MD to f/u after IV Cardizem to decide need to tx to higher level of care. Will continue to monitor closely on telemetry.   Jeryl Columbia, NP-C [facility] Pager [phone]

## 2017-02-04 NOTE — Progress Notes (Signed)
  PROGRESS NOTE  [name] ZHY:[id number] DOB: 15-Jun-1933 DOA: 02/01/2017 PCP: [name], MD  Brief Narrative: 83 year old woman PMH dementia, diabetes mellituspresented to the emergency department with fever, foul-smelling urine. Admitted for UTI with sepsis.  Assessment/Plan E coli UTI with sepsis with associated thrombocytopenia  -afebrile. E coli pansensitive. Continue IV abx given nausea, poor oral intake -Thrombocytopenia resolved  Transient afib/flutter in the setting of sepsis -Telemetry SR. EKG this AM shows SR. Echo unremarkable. TSH WNL. -given transient nature will not anticoagulate. Continue telemetry. If no recurrence will not anticoagulate on discharge.  Prediabetes -diet controlled, fasting blood sugar 112. No further evaluation planned. No need for SSI.  Essential hypertension -stable. Continue atenolol.  Chronic pain syndrome - stable. Continue morphine, gabapentin  Hyperlipidemia -Crestor  Obstructive sleep apnea -CPAP QHS  Aortic atherosclerosis  -asymptomatic. Continue statin. Follow-up as an outpatient.  DVT prophylaxis: SCDs Code Status: full Family Communication: 2 sons at bedside Disposition Plan: home    Murray Hodgkins, MD  [facility] Direct contact: [phone] --Via [place]  --[email or web address]; password [email or web address]  7PM-7AM contact night coverage as above 02/04/2017, 2:28 PM  LOS: 2 days   Consultants:    Procedures:  2-d echo Study Conclusions  - Left ventricle: The cavity size was normal. There was moderate   focal basal hypertrophy. Systolic function was normal. The   estimated ejection fraction was in the range of 60% to 65%. Wall   motion was normal; there were no regional wall motion   abnormalities. The study is not technically sufficient to allow   evaluation of LV diastolic function. Doppler parameters are   consistent with high ventricular filling pressure. - Aortic valve: Trileaflet; normal  thickness, mildly calcified   leaflets. - Mitral valve: Calcified annulus.  Antimicrobials:  Ceftriaxone 10/3 >>  Interval history/Subjective: Feels nauseous, tired. Feels poorly today.  Objective: Vitals: afebrile, VSS, 98.4, 16, 97, 147/86, 93% on RA  Exam:    Constitutional:  Appears calm but uncomfortable ENMT:  grossly normal hearing  Respiratory:  CTA bilaterally, no w/r/r.  Respiratory effort normal.  Cardiovascular:  RRR, no m/r/g No LE extremity edema   Musculoskeletal:  BLE strength and tone normal, no atrophy, no abnormal movements Psychiatric:  Mental status Mood, affect appropriate   I have personally reviewed the following:   Labs:  BMP unremarkable  WBC 12.2  Plts now WNL  TSH 3.485  UC noted  Imaging studies:    Medical tests:      Scheduled Meds: . aspirin EC  81 mg Oral Daily  . clopidogrel  75 mg Oral Daily  . DULoxetine  30 mg Oral TID  . enoxaparin (LOVENOX) injection  1 mg/kg Subcutaneous Q12H  . gabapentin  800 mg Oral TID  . rosuvastatin  40 mg Oral Daily   Continuous Infusions: . cefTRIAXone (ROCEPHIN)  IV Stopped (02/04/17 1020)    Principal Problem:   Sepsis secondary to UTI [facility]) Active Problems:   Hyperlipidemia   Essential hypertension   OSA (obstructive sleep apnea)   Chronic pain syndrome   Tachycardia   LOS: 2 days

## 2017-02-04 NOTE — Progress Notes (Signed)
PT refused to wear CPAP.  RT will continue to monitor.  

## 2017-02-05 MED ORDER — CEFUROXIME AXETIL 500 MG PO TABS
500.0000 mg | ORAL_TABLET | Freq: Two times a day (BID) | ORAL | Status: DC
  Administered 2017-02-05: 500 mg via ORAL
  Filled 2017-02-05: qty 1

## 2017-02-05 MED ORDER — CEFUROXIME AXETIL 500 MG PO TABS: 500.0000 mg | ORAL_TABLET | Freq: Two times a day (BID) | ORAL | 0 refills | Status: DC

## 2017-02-05 NOTE — Progress Notes (Signed)
SATURATION QUALIFICATIONS: (This note is used to comply with regulatory documentation for home oxygen)  Patient Saturations on Room Air at Rest = 93%  Patient Saturations on Room Air while Ambulating = 95%    Please briefly explain why patient needs home oxygen: Not necessary just ruling out.

## 2017-02-05 NOTE — Progress Notes (Signed)
Completed walking o2 eval for patient. She didn't qualify for oxygen needs based off of my evaluation. Will finish up discharge now.

## 2017-02-05 NOTE — Discharge Summary (Signed)
Physician Discharge Summary  [name]:[id number] DOB: 11-05-1933 DOA: 02/01/2017  PCP: [name], MD  Admit date: 02/01/2017 Discharge date: 02/05/2017  Recommendations for Outpatient Follow-up:  1. Follow-up resolution of UTI  Follow-up Information    [name], MD. Schedule an appointment as soon as possible for a visit in 1 week(s).   Specialty:  Family Medicine Contact information: [address] [place] [address] [phone]            Discharge Diagnoses:  1. Escherichia coli UTI with sepsis 2. Thrombocytopenia 3. Transient atrial fibrillation/flutter 4. Essential hypertension 5. Chronic pain syndrome 6. Aortic atherosclerosis  Discharge Condition: improved Disposition: home  Diet recommendation: heart healthy, diabetic diet  Filed Weights   02/03/17 1000  Weight: 82.6 kg (182 lb)    History of present illness:  83 year old woman PMH dementia, diabetes mellitus presented to the emergency department with fever, foul-smelling urine. Admitted for UTI with sepsis.  Hospital Course:  Patient was treated with empiric antibiotics with gradual clinical improvement. Sepsis resolved. Subsequently transition to oral therapy.Transient atrial fibrillation/flutter was noted in the setting of sepsis.she has remained in sinus rhythm subsequently. Echocardiogram and TSH were unremarkable. Given the transient nature of her arrhythmia, no anticoagulations recommended. Chronic pain, essential hypertension remained stable.  Procedures:  2-d echo Study Conclusions  - Left ventricle: The cavity size was normal. There was moderate focal basal hypertrophy. Systolic function was normal. The estimated ejection fraction was in the range of 60% to 65%. Wall motion was normal; there were no regional wall motion abnormalities. The study is not technically sufficient to allow evaluation of LV diastolic function. Doppler parameters  are consistent with high ventricular filling pressure. - Aortic valve: Trileaflet; normal thickness, mildly calcified leaflets. - Mitral valve: Calcified annulus.  Antimicrobials:  Ceftriaxone 10/3 >> 10/6  Ceftin 10/7 >> 10/10  Today's assessment: S: feels poorly but no specific symptoms O: Vitals: 98.6, 18,72, 145/90, 93% on RA  Constitutional:  Appears calm and comfortable ENMT:  grossly normal hearing  Respiratory:  CTA bilaterally, no w/r/r.  Respiratory effort normal.  Cardiovascular:  RRR, no m/r/g. Telemetry SR. No LE extremity edema   Abdomen:  Soft, ntnd Musculoskeletal:  Moves both legs to command Psychiatric:  Mental status Mood, affect appropriate Orientation to person, place, month, not year   Discharge Instructions  Discharge Instructions    Activity as tolerated - No restrictions    Complete by:  As directed    Diet - low sodium heart healthy    Complete by:  As directed    Diet Carb Modified    Complete by:  As directed    Discharge instructions    Complete by:  As directed    Call your physician or seek immediate medical attention for pain, fever, shortness of breath, confusion, weakness or worsening of condition.     Allergies as of 02/05/2017      Reactions   Sulfonamide Derivatives Hives, Rash      Medication List    TAKE these medications   aspirin EC 81 MG tablet Take 1 tablet (81 mg total) by mouth daily.   atenolol 25 MG tablet Commonly known as:  TENORMIN TAKE 1 TABLET(25 MG) BY MOUTH DAILY   cefUROXime 500 MG tablet Commonly known as:  CEFTIN Take 1 tablet (500 mg total) by mouth 2 (two) times daily with a meal.   clopidogrel 75 MG tablet Commonly known as:  PLAVIX Take 1 tablet (  75 mg total) by mouth daily.   DULoxetine 30 MG capsule Commonly known as:  CYMBALTA Take 30 mg by mouth 3 (three) times daily.   gabapentin 800 MG tablet Commonly known as:  NEURONTIN TAKE 1 TABLET BY MOUTH EVERY MORNING AND AT  LUNCH, 2 TABLETS BY MOUTH EVERY NIGHT AT BEDTIME   hydrochlorothiazide 25 MG tablet Commonly known as:  HYDRODIURIL TAKE 1 TABLET BY MOUTH EVERY DAY   meclizine 25 MG tablet Commonly known as:  ANTIVERT Take 1 tablet (25 mg total) by mouth 3 (three) times daily as needed for dizziness.   oxymorphone 5 MG tablet Commonly known as:  OPANA Take 5 mg by mouth 3 (three) times daily.   rosuvastatin 40 MG tablet Commonly known as:  CRESTOR Take 1 tablet (40 mg total) by mouth daily.      Allergies  Allergen Reactions  . Sulfonamide Derivatives Hives and Rash    The results of significant diagnostics from this hospitalization (including imaging, microbiology, ancillary and laboratory) are listed below for reference.    Significant Diagnostic Studies: Dg Chest Port 1 View  Result Date: 02/02/2017 CLINICAL DATA:  Foul smelling urine with burning sensation. Fever. Weakness for 3 days. EXAM: PORTABLE CHEST 1 VIEW COMPARISON:  06/17/2016 FINDINGS: Normal heart size and pulmonary vascularity. No airspace disease or consolidation in the lungs. No blunting of costophrenic angles. No pneumothorax. Mediastinal contours appear intact. Calcified and tortuous aorta. IMPRESSION: No evidence of active pulmonary disease.  Aortic atherosclerosis. Electronically Signed   By: Lucienne Capers M.D.   On: 02/02/2017 00:28    Microbiology: Recent Results (from the past 240 hour(s))  Blood Culture (routine x 2)     Status: None (Preliminary result)   Collection Time: 02/02/17 12:05 AM  Result Value Ref Range Status   Specimen Description BLOOD RIGHT ANTECUBITAL  Final   Special Requests   Final    BOTTLES DRAWN AEROBIC AND ANAEROBIC Blood Culture adequate volume   Culture NO GROWTH 2 DAYS  Final   Report Status PENDING  Incomplete  Blood Culture (routine x 2)     Status: None (Preliminary result)   Collection Time: 02/02/17 12:21 AM  Result Value Ref Range Status   Specimen Description BLOOD RIGHT ARM   Final   Special Requests IN PEDIATRIC BOTTLE Blood Culture adequate volume  Final   Culture NO GROWTH 2 DAYS  Final   Report Status PENDING  Incomplete  Urine culture     Status: Abnormal   Collection Time: 02/02/17  1:03 AM  Result Value Ref Range Status   Specimen Description URINE, RANDOM  Final   Special Requests NONE  Final   Culture >=100,000 COLONIES/mL ESCHERICHIA COLI (A)  Final   Report Status 02/04/2017 FINAL  Final   Organism ID, Bacteria ESCHERICHIA COLI (A)  Final      Susceptibility   Escherichia coli - MIC*    AMPICILLIN <=2 SENSITIVE Sensitive     CEFAZOLIN <=4 SENSITIVE Sensitive     CEFTRIAXONE <=1 SENSITIVE Sensitive     CIPROFLOXACIN <=0.25 SENSITIVE Sensitive     GENTAMICIN <=1 SENSITIVE Sensitive     IMIPENEM <=0.25 SENSITIVE Sensitive     NITROFURANTOIN <=16 SENSITIVE Sensitive     TRIMETH/SULFA <=20 SENSITIVE Sensitive     AMPICILLIN/SULBACTAM <=2 SENSITIVE Sensitive     PIP/TAZO <=4 SENSITIVE Sensitive     Extended ESBL NEGATIVE Sensitive     * >=100,000 COLONIES/mL ESCHERICHIA COLI     Labs: Basic Metabolic Panel:  Recent Labs Lab 02/01/17 0028 02/02/17 0402 02/03/17 0633 02/04/17 0350  NA 137 139 132* 132*  K 3.5 3.5 3.5 4.3  CL 98* 104 101 98*  CO2 29 27 24 23   GLUCOSE 154* 136* 116* 112*  BUN 25* 20 14 14   CREATININE 1.19* 1.14* 0.80 0.78  CALCIUM 9.2 8.4* 8.2* 8.5*  MG  --   --  1.5* 2.0   Liver Function Tests:  Recent Labs Lab 02/01/17 0028  AST 28  ALT 16  ALKPHOS 41  BILITOT 1.3*  PROT 7.1  ALBUMIN 3.3*   CBC:  Recent Labs Lab 02/01/17 0028 02/02/17 0402 02/03/17 0633 02/04/17 0350  WBC 14.3* 11.1* 13.6* 12.2*  NEUTROABS 11.4*  --   --   --   HGB 12.1 10.7* 12.1 12.5  HCT 38.0 33.5* 38.6 38.5  MCV 93.1 93.6 92.6 90.6  PLT 163 141* 142* 181    Principal Problem:   Sepsis secondary to UTI ([place]) Active Problems:   Hyperlipidemia   Essential hypertension   OSA (obstructive sleep apnea)   Chronic pain  syndrome   Tachycardia   Time coordinating discharge: 25 minutes  Signed:  Murray Hodgkins, MD [facility] 02/05/2017, 12:23 PM

## 2017-02-05 NOTE — Progress Notes (Signed)
Nsg Discharge Note  Admit Date:  02/01/2017 Discharge date: 02/05/2017   [name] to be D/C'd Home per MD order.  AVS completed.  Copy for chart, and copy for patient signed, and dated. Patient/caregiver able to verbalize understanding.  Discharge Medication: Allergies as of 02/05/2017      Reactions   Sulfonamide Derivatives Hives, Rash      Medication List    TAKE these medications   aspirin EC 81 MG tablet Take 1 tablet (81 mg total) by mouth daily.   atenolol 25 MG tablet Commonly known as:  TENORMIN TAKE 1 TABLET(25 MG) BY MOUTH DAILY   cefUROXime 500 MG tablet Commonly known as:  CEFTIN Take 1 tablet (500 mg total) by mouth 2 (two) times daily with a meal.   clopidogrel 75 MG tablet Commonly known as:  PLAVIX Take 1 tablet (75 mg total) by mouth daily.   DULoxetine 30 MG capsule Commonly known as:  CYMBALTA Take 30 mg by mouth 3 (three) times daily.   gabapentin 800 MG tablet Commonly known as:  NEURONTIN TAKE 1 TABLET BY MOUTH EVERY MORNING AND AT LUNCH, 2 TABLETS BY MOUTH EVERY NIGHT AT BEDTIME   hydrochlorothiazide 25 MG tablet Commonly known as:  HYDRODIURIL TAKE 1 TABLET BY MOUTH EVERY DAY   meclizine 25 MG tablet Commonly known as:  ANTIVERT Take 1 tablet (25 mg total) by mouth 3 (three) times daily as needed for dizziness.   oxymorphone 5 MG tablet Commonly known as:  OPANA Take 5 mg by mouth 3 (three) times daily.   rosuvastatin 40 MG tablet Commonly known as:  CRESTOR Take 1 tablet (40 mg total) by mouth daily.       Discharge Assessment: Vitals:   02/05/17 0547 02/05/17 0913  BP: 126/73 (!) 145/90  Pulse: 78 72  Resp: 18 18  Temp: 98.9 F (37.2 C) 98.6 F (37 C)  SpO2: 96% 93%   Skin clean, dry and intact without evidence of skin break down, no evidence of skin tears noted. IV catheter discontinued intact. Site without signs and symptoms of complications - no redness or edema noted at insertion site, patient denies c/o pain -  only slight tenderness at site.  Dressing with slight pressure applied.  D/c Instructions-Education: Discharge instructions given to patient/family with verbalized understanding. D/c education completed with patient/family including follow up instructions, medication list, d/c activities limitations if indicated, with other d/c instructions as indicated by MD - patient able to verbalize understanding, all questions fully answered. Patient instructed to return to ED, call 911, or call MD for any changes in condition.   Waiting for pt. Daughter to pick her up to be discharged. She said she'd be here about 4pm.  Eliot Ford, RN 02/05/2017 4:15 PM

## 2017-02-07 LAB — CULTURE, BLOOD (ROUTINE X 2)
Culture: NO GROWTH
Culture: NO GROWTH
Special Requests: ADEQUATE
Special Requests: ADEQUATE

## 2017-02-07 NOTE — Consult Note (Signed)
           [facility] CM Primary Care Navigator  02/07/2017  RHETA HEMMELGARN 01/02/1934 [id number]   Attemptto seepatient at the bedsideto identify possible discharge needs but she was alreadydischargedper staff report.  Patient was discharged home over the weekend.  Primary care provider's officeis listed as doing transition of care (TOC).  Patient has a discharge instruction to follow-up with primary care provider within1 week and follow-up resolution of UTI.   For questions, please contact:  Dannielle Huh, BSN, RN- [facility] Primary Care Navigator  Telephone: [phone] [place]

## 2017-02-09 ENCOUNTER — Other Ambulatory Visit: Payer: Self-pay

## 2017-02-09 MED ORDER — HYDROCHLOROTHIAZIDE 25 MG PO TABS: 25.0000 mg | ORAL_TABLET | Freq: Every day | ORAL | 1 refills | Status: DC

## 2017-04-01 DIAGNOSIS — M1380 Other specified arthritis, unspecified site: Secondary | ICD-10-CM | POA: Diagnosis not present

## 2017-04-04 ENCOUNTER — Telehealth: Payer: Self-pay | Admitting: Family Medicine

## 2017-04-04 NOTE — Telephone Encounter (Signed)
I tried to call pt about all this with Dr Brien Few and follow up appt with PCP.  She very confused!!!  I called her son, Audelia Acton.  He does know about appt here on Thursday with PCP scheduled as 3 mth follow up. (was sched 01/07/17)  Also had appt at [facility] for 9 AM.  Next week pending procedure with Dr Brien Few.  Told to keep this appt here on Thursday.  He will call [place] and reschedule that.

## 2017-04-04 NOTE — Telephone Encounter (Signed)
agree

## 2017-04-04 NOTE — Telephone Encounter (Signed)
Having spinal injection next week Dr Brien Few.  Needs clearance for Plavix. Sending clearance form.

## 2017-04-04 NOTE — Telephone Encounter (Signed)
She needs to be seen prior to any clearance.   Was admitted to the hospital with sepsis on 10/3 and I haven't even had a hospital follow up.

## 2017-04-05 ENCOUNTER — Other Ambulatory Visit: Payer: Self-pay | Admitting: Family Medicine

## 2017-04-05 MED ORDER — ATENOLOL 25 MG PO TABS: ORAL_TABLET | ORAL | 3 refills | Status: DC

## 2017-04-06 ENCOUNTER — Ambulatory Visit: Payer: Medicare Other | Admitting: Family Medicine

## 2017-04-06 ENCOUNTER — Ambulatory Visit: Payer: Medicare Other | Admitting: Podiatry

## 2017-04-07 ENCOUNTER — Encounter: Payer: Self-pay | Admitting: Family Medicine

## 2017-04-11 ENCOUNTER — Ambulatory Visit: Payer: Medicare Other | Admitting: Family Medicine

## 2017-04-17 ENCOUNTER — Encounter: Payer: Self-pay | Admitting: Family Medicine

## 2017-04-17 ENCOUNTER — Ambulatory Visit (INDEPENDENT_AMBULATORY_CARE_PROVIDER_SITE_OTHER): Payer: Medicare Other | Admitting: Family Medicine

## 2017-04-17 VITALS — BP 126/68 | HR 60 | Temp 98.3°F | Resp 16 | Ht 60.0 in | Wt 172.0 lb

## 2017-04-17 DIAGNOSIS — R7303 Prediabetes: Secondary | ICD-10-CM | POA: Diagnosis not present

## 2017-04-17 DIAGNOSIS — A419 Sepsis, unspecified organism: Secondary | ICD-10-CM | POA: Diagnosis not present

## 2017-04-17 DIAGNOSIS — N390 Urinary tract infection, site not specified: Secondary | ICD-10-CM | POA: Diagnosis not present

## 2017-04-17 DIAGNOSIS — I6381 Other cerebral infarction due to occlusion or stenosis of small artery: Secondary | ICD-10-CM

## 2017-04-17 DIAGNOSIS — G894 Chronic pain syndrome: Secondary | ICD-10-CM | POA: Diagnosis not present

## 2017-04-17 NOTE — Progress Notes (Signed)
Subjective:    Patient ID: [name], female    DOB: 1934-02-06, 83 y.o.   MRN: [id number]  HPI  8.23.18 Referred by her pain management specialist for dizziness and frequent falls.  He had recommended work up for stroke.  Patient states that over the last month, she has had frequent attacks of dizziness. The attacks occur with motion. They also occur while sitting still. They're associated with a spinning sensation. However occasionally they also are just associated with lightheadedness. She also reports a roaring sound in her brain.  She is falling frequently and is now having to ambulate with a cane due to balance issues stemming from this. Patient is on numerous medications that can cause dizziness due to severe peripheral neuropathy including gabapentin 800 mg twice daily and 1600 mg at night, oxymorphone, Cymbalta, and 2 separate antihypertensives. She denies any head injuries. She denies any loss of consciousness. She denies any tachycardia or palpitations. She is slurring her words slightly. There is no facial droop. Cranial nerves II through XII are grossly intact. Cerebellar exam today is normal although the patient shows slow movement and is walking with a cane and tends to have a slow shuffling gait. There is no gross neurologic deficit in her upper and lower extremities. There is no pronator drift.  At that time, my plan was: I believe the situation is multi-factorial. Physical exam is significant for a positive Dix-Hallpike maneuver to the right with resultant vertigo. Therefore I believe much of what she is experiencing is likely BPPV. We can treat this with meclizine 25 mg every 8 hours as needed and tincture of time. However I'm concerned that the meclizine will exacerbate her second problem which is polypharmacy. I believe that the vertigo, polypharmacy, age, and deconditioning is contributing to her frequent falls. Family is concerned about a stroke as the patient's son recently had a  stroke. I believe is reasonable given her neurologic findings to proceed with an MRI of the brain. However I also recommended that we try to gradually wean down on her medication if possible. I recommended reducing her dosage of gabapentin by 50% to see if symptoms would improve. I recommended 400 mg twice daily and 800 mg at night particularly while she is taking meclizine.  Given the roaring sensation in her ears, which I would interpret as tinnitus, Mnire's disease is also on the differential diagnosis  01/05/17 MRI revealed chronic microvascular ischemia and small vessel changes as well as a subacute lacunar infarct in the right MCA territory. Patient is here today to discuss further. She has not seen any benefit since starting meclizine. She is also not seen any benefit in her dizziness since discontinuing gabapentin. She admits that she has not been taking her aspirin. Her most recent cholesterol shows an LDL cholesterol of 116 on simvastatin. At that time, my plan was:  Discontinue meclizine. Replace aspirin with Plavix 75 mg by mouth daily for secondary prevention of stroke. Discontinue simvastatin and begin Crestor 40 mg a day and recheck LDL cholesterol in 3 months. Obtain carotid Dopplers to rule out carotid artery stenosis. Consult physical therapy to assist with balance.  Patient has since been hospitalized.  I have copied relevant portions of the DC summary below for my reference: Admit date: 02/01/2017 Discharge date: 02/05/2017 Discharge Diagnoses:  1. Escherichia coli UTI with sepsis 2. Thrombocytopenia 3. Transient atrial fibrillation/flutter 4. Essential hypertension 5. Chronic pain syndrome 6. Aortic atherosclerosis  Discharge Condition: improved Disposition: home  Diet  recommendation: heart healthy, diabetic diet     Filed Weights   02/03/17 1000  Weight: 82.6 kg (182 lb)    History of present illness:  83 year old woman PMH dementia, diabetes mellitus  presented to the emergency department with fever, foul-smelling urine. Admitted for UTI with sepsis.  Hospital Course:  Patient was treated with empiric antibiotics with gradual clinical improvement. Sepsis resolved. Subsequently transition to oral therapy.Transient atrial fibrillation/flutter was noted in the setting of sepsis.she has remained in sinus rhythm subsequently. Echocardiogram and TSH were unremarkable. Given the transient nature of her arrhythmia, no anticoagulations recommended. Chronic pain, essential hypertension remained stable.  Procedures:  2-d echo Study Conclusions  - Left ventricle: The cavity size was normal. There was moderate focal basal hypertrophy. Systolic function was normal. The estimated ejection fraction was in the range of 60% to 65%. Wall motion was normal; there were no regional wall motion abnormalities. The study is not technically sufficient to allow evaluation of LV diastolic function. Doppler parameters are consistent with high ventricular filling pressure. - Aortic valve: Trileaflet; normal thickness, mildly calcified leaflets. - Mitral valve: Calcified annulus.  Antimicrobials:  Ceftriaxone 10/3 >> 10/6  Ceftin 10/7 >> 10/10   04/17/17 Here for follow up.   Patient is scheduled to see her pain specialist for an epidural steroid injection later this month.  They will need her to discontinue Plavix 1 week prior.  She is taking Plavix for secondary prevention of CVA given the history of lacunar infarcts despite being on aspirin.  Patient is willing to accept the slightly higher risk of CVA for the 7 days she would not be taking her antiplatelet therapy given the severe pain in her back that she experiences.  We discussed this in detail today and I outlined the risk for them and they are willing to proceed.  Therefore I am willing for her to stop her antiplatelet therapy 7 days prior to the epidural steroid injection.  She denies  any further complications since discharge from the hospital.  She was extremely sick/sepsis and had an isolated episode of atrial fibrillation that was transient and self-limited.  She denies any palpitations or chest pain or shortness of breath or syncope or near syncope since discharge from the hospital.  Overall she has been doing extremely well aside from pain in her back.  She also has pain in the left first [facility] joint and MCP joint.  Went to urgent care where x-ray was negative for fracture.  They placed her in a thumb spica splint but she continues to have pain in those joints related to arthritis.  She would like to see an orthopedist for possible cortisone injection in her joint. Past Medical History:  Diagnosis Date  . [name] (abdominal aortic aneurysm) ([place]) 09/2013   3.6 cm  . Dementia   . Diabetes mellitus    pre-diabetes  . Diverticulosis   . Hyperlipidemia   . Hypertension   . Nephrolithiasis   . OSA on CPAP   . Osteoarthritis   . Osteopenia    T=  -2.1 in hip  . Rheumatoid arthritis(714.0)   . S/P carpal tunnel release   . Spinal stenosis    Past Surgical History:  Procedure Laterality Date  . APPENDECTOMY    . CARPAL TUNNEL RELEASE  12/27/2006   Right subcutaneous ulnar nerve transfer -- Right open carpal tunnel release  . CHOLECYSTECTOMY    . FOOT SURGERY    . JOINT REPLACEMENT     BTKR, RTHR  .  TOTAL KNEE ARTHROPLASTY  08/21/2007   Right total knee replacement   Current Outpatient Medications on File Prior to Visit  Medication Sig Dispense Refill  . aspirin EC 81 MG tablet Take 1 tablet (81 mg total) by mouth daily. 150 tablet 2  . atenolol (TENORMIN) 25 MG tablet TAKE 1 TABLET(25 MG) BY MOUTH DAILY 90 tablet 3  . clopidogrel (PLAVIX) 75 MG tablet Take 1 tablet (75 mg total) by mouth daily. 90 tablet 3  . DULoxetine (CYMBALTA) 30 MG capsule Take 30 mg by mouth 3 (three) times daily.   2  . gabapentin (NEURONTIN) 800 MG tablet TAKE 1 TABLET BY MOUTH EVERY MORNING  AND AT LUNCH, 2 TABLETS BY MOUTH EVERY NIGHT AT BEDTIME 360 tablet 4  . hydrochlorothiazide (HYDRODIURIL) 25 MG tablet Take 1 tablet (25 mg total) by mouth daily. 90 tablet 1  . oxymorphone (OPANA) 5 MG tablet Take 5 mg by mouth 3 (three) times daily.    . rosuvastatin (CRESTOR) 40 MG tablet Take 1 tablet (40 mg total) by mouth daily. 90 tablet 3   No current facility-administered medications on file prior to visit.    Allergies  Allergen Reactions  . Sulfonamide Derivatives Hives and Rash   Social History   Socioeconomic History  . Marital status: Divorced    Spouse name: Not on file  . Number of children: 6  . Years of education: Not on file  . Highest education level: Not on file  Social Needs  . Financial resource strain: Not on file  . Food insecurity - worry: Not on file  . Food insecurity - inability: Not on file  . Transportation needs - medical: Not on file  . Transportation needs - non-medical: Not on file  Occupational History  . Not on file  Tobacco Use  . Smoking status: Never Smoker  . Smokeless tobacco: Never Used  Substance and Sexual Activity  . Alcohol use: No  . Drug use: No  . Sexual activity: Not on file  Other Topics Concern  . Not on file  Social History Narrative  . Not on file     Review of Systems  All other systems reviewed and are negative.      Objective:   Physical Exam  Constitutional: She is oriented to person, place, and time. She appears well-developed and well-nourished. No distress.  HENT:  Right Ear: External ear normal.  Left Ear: External ear normal.  Nose: Nose normal.  Mouth/Throat: Oropharynx is clear and moist. No oropharyngeal exudate.  Eyes: Conjunctivae and EOM are normal. Pupils are equal, round, and reactive to light.  Cardiovascular: Normal rate, regular rhythm and normal heart sounds.  Pulmonary/Chest: Effort normal and breath sounds normal. No respiratory distress. She has no wheezes. She has no rales.    Abdominal: Bowel sounds are normal.  Lymphadenopathy:    She has no cervical adenopathy.  Neurological: She is alert and oriented to person, place, and time. She has normal reflexes. No cranial nerve deficit. She exhibits normal muscle tone. Coordination normal.  Skin: She is not diaphoretic.  Psychiatric: Judgment and thought content normal. Her speech is slurred. She is slowed. Cognition and memory are normal.  Vitals reviewed.         Assessment & Plan:   Lacunar infarction - Plan: CBC with Differential/Platelet, COMPLETE METABOLIC PANEL WITH GFR, Lipid panel  Sepsis secondary to UTI (HCC)  Chronic pain syndrome  Pre-diabetes - Plan: Hemoglobin A1c  Sepsis has resolved.  Discharge from  the hospital more than 2 months ago with no recurrent very tract infection or residual sign of infection.  I would like to follow-up her recent lacunar infarct by checking a CBC today to monitor for anemia on antiplatelet therapy, check a fasting lipid panel to ensure her LDL cholesterol is less than 70.  Given her history of prediabetes, I would also like to follow-up with a hemoglobin A1c.  Her blood pressure today is acceptable.  Assuming her hemoglobin A1c is under control and her LDL cholesterol is less than 70, we will optimize preventative strategies for stroke.  She had an isolated transient incident of atrial fibrillation related to sepsis.  Given her history of polypharmacy and falls, she will be a high risk candidate for anticoagulation.  At the present time we will continue Plavix.  If she experiences any palpitations or lightheaded or dizziness, I would proceed with an event monitor to evaluate for possible atrial fibrillation to determine if we need to change her anticoagulation from Plavix to Eliquis.  However the present time we will monitor her clinically.  Regarding her chronic back pain, she is taking gabapentin.  Cymbalta and oxymorphone are prescribed by her pain specialist.  However  she is not taking Cymbalta when I reviewed her medicines with her today.  Therefore I recommended that she call her pain specialist to see if they want her to continue that medication.  She is medically cleared to proceed with upcoming epidural steroid injection, as long as she is willing to accept the slightly higher risk of stroke for the 7 days that she is not on antiplatelet therapy.  She is willing to accept that risk.

## 2017-04-18 ENCOUNTER — Ambulatory Visit (INDEPENDENT_AMBULATORY_CARE_PROVIDER_SITE_OTHER): Payer: Medicare Other | Admitting: Podiatry

## 2017-04-18 ENCOUNTER — Other Ambulatory Visit: Payer: Self-pay | Admitting: Family Medicine

## 2017-04-18 DIAGNOSIS — B351 Tinea unguium: Secondary | ICD-10-CM | POA: Diagnosis not present

## 2017-04-18 DIAGNOSIS — M25542 Pain in joints of left hand: Secondary | ICD-10-CM

## 2017-04-18 DIAGNOSIS — Q828 Other specified congenital malformations of skin: Secondary | ICD-10-CM

## 2017-04-18 DIAGNOSIS — E1142 Type 2 diabetes mellitus with diabetic polyneuropathy: Secondary | ICD-10-CM

## 2017-04-18 DIAGNOSIS — M79676 Pain in unspecified toe(s): Secondary | ICD-10-CM

## 2017-04-18 LAB — COMPLETE METABOLIC PANEL WITH GFR
AG Ratio: 1.1 (calc) (ref 1.0–2.5)
ALT: 10 U/L (ref 6–29)
AST: 19 U/L (ref 10–35)
Albumin: 3.9 g/dL (ref 3.6–5.1)
Alkaline phosphatase (APISO): 44 U/L (ref 33–130)
BUN/Creatinine Ratio: 24 (calc) — ABNORMAL HIGH (ref 6–22)
BUN: 23 mg/dL (ref 7–25)
CO2: 30 mmol/L (ref 20–32)
Calcium: 9.5 mg/dL (ref 8.6–10.4)
Chloride: 99 mmol/L (ref 98–110)
Creat: 0.95 mg/dL — ABNORMAL HIGH (ref 0.60–0.88)
GFR, Est African American: 65 mL/min/{1.73_m2} (ref >=60)
GFR, Est Non African American: 56 mL/min/{1.73_m2} — ABNORMAL LOW (ref >=60)
Globulin: 3.7 g/dL (calc) (ref 1.9–3.7)
Glucose, Bld: 101 mg/dL — ABNORMAL HIGH (ref 65–99)
Potassium: 4.2 mmol/L (ref 3.5–5.3)
Sodium: 139 mmol/L (ref 135–146)
Total Bilirubin: 0.6 mg/dL (ref 0.2–1.2)
Total Protein: 7.6 g/dL (ref 6.1–8.1)

## 2017-04-18 LAB — LIPID PANEL
Cholesterol: 97 mg/dL (ref <200)
HDL: 42 mg/dL — ABNORMAL LOW (ref >50)
LDL Cholesterol (Calc): 36 mg/dL (calc)
Non-HDL Cholesterol (Calc): 55 mg/dL (calc) (ref <130)
Total CHOL/HDL Ratio: 2.3 (calc) (ref <5.0)
Triglycerides: 105 mg/dL (ref <150)

## 2017-04-18 LAB — CBC WITH DIFFERENTIAL/PLATELET
Basophils Absolute: 103 cells/uL (ref 0–200)
Basophils Relative: 1.1 %
Eosinophils Absolute: 197 cells/uL (ref 15–500)
Eosinophils Relative: 2.1 %
HCT: 41.1 % (ref 35.0–45.0)
Hemoglobin: 13.5 g/dL (ref 11.7–15.5)
Lymphs Abs: 4089 cells/uL — ABNORMAL HIGH (ref 850–3900)
MCH: 29.3 pg (ref 27.0–33.0)
MCHC: 32.8 g/dL (ref 32.0–36.0)
MCV: 89.3 fL (ref 80.0–100.0)
MPV: 10.6 fL (ref 7.5–12.5)
Monocytes Relative: 6.2 %
Neutro Abs: 4427 cells/uL (ref 1500–7800)
Neutrophils Relative %: 47.1 %
Platelets: 215 10*3/uL (ref 140–400)
RBC: 4.6 10*6/uL (ref 3.80–5.10)
RDW: 12.5 % (ref 11.0–15.0)
Total Lymphocyte: 43.5 %
WBC mixed population: 583 cells/uL (ref 200–950)
WBC: 9.4 10*3/uL (ref 3.8–10.8)

## 2017-04-18 LAB — HEMOGLOBIN A1C
Hgb A1c MFr Bld: 6.1 % of total Hgb — ABNORMAL HIGH (ref <5.7)
Mean Plasma Glucose: 128 (calc)
eAG (mmol/L): 7.1 (calc)

## 2017-04-18 NOTE — Progress Notes (Signed)
She presents today chief complaint of painful elongated toenails 3 toes on the right foot 1 through 5 on the left foot.  Objective: No change in physical exam nails are long thick yellow dystrophic clinically mycotic.  No open lesions no wounds.  Assessment: Diabetes mellitus with history of amputations painful elongated toenails 3 toes right foot 5 toes left foot.  Plan: Debridement of nails bilateral.

## 2017-04-19 ENCOUNTER — Encounter: Payer: Self-pay | Admitting: Family Medicine

## 2017-05-10 DIAGNOSIS — M79642 Pain in left hand: Secondary | ICD-10-CM | POA: Diagnosis not present

## 2017-05-10 DIAGNOSIS — M79645 Pain in left finger(s): Secondary | ICD-10-CM | POA: Diagnosis not present

## 2017-05-10 DIAGNOSIS — M1812 Unilateral primary osteoarthritis of first carpometacarpal joint, left hand: Secondary | ICD-10-CM | POA: Diagnosis not present

## 2017-05-16 DIAGNOSIS — Z6829 Body mass index (BMI) 29.0-29.9, adult: Secondary | ICD-10-CM | POA: Diagnosis not present

## 2017-05-16 DIAGNOSIS — M47816 Spondylosis without myelopathy or radiculopathy, lumbar region: Secondary | ICD-10-CM | POA: Diagnosis not present

## 2017-05-16 DIAGNOSIS — I10 Essential (primary) hypertension: Secondary | ICD-10-CM | POA: Diagnosis not present

## 2017-06-14 DIAGNOSIS — M47816 Spondylosis without myelopathy or radiculopathy, lumbar region: Secondary | ICD-10-CM | POA: Diagnosis not present

## 2017-06-29 ENCOUNTER — Ambulatory Visit (INDEPENDENT_AMBULATORY_CARE_PROVIDER_SITE_OTHER): Payer: Medicare Other | Admitting: Physician Assistant

## 2017-06-29 ENCOUNTER — Encounter: Payer: Self-pay | Admitting: Physician Assistant

## 2017-06-29 ENCOUNTER — Other Ambulatory Visit: Payer: Self-pay

## 2017-06-29 VITALS — BP 102/60 | HR 103 | Temp 98.2°F | Resp 14 | Wt 167.8 lb

## 2017-06-29 DIAGNOSIS — J988 Other specified respiratory disorders: Secondary | ICD-10-CM | POA: Diagnosis not present

## 2017-06-29 DIAGNOSIS — B9689 Other specified bacterial agents as the cause of diseases classified elsewhere: Secondary | ICD-10-CM

## 2017-06-29 MED ORDER — AZITHROMYCIN 250 MG PO TABS: ORAL_TABLET | ORAL | 0 refills | Status: DC

## 2017-06-29 NOTE — Progress Notes (Signed)
Patient ID: [name] MRN: [id number], DOB: Jun 08, 1933, 84 y.o. Date of Encounter: 06/29/2017, 2:39 PM    Chief Complaint:  Chief Complaint  Patient presents with  . Cough    x3-4days      HPI: 84 y.o. year old female presents with above.   She is accompanied by her daughter for visit.   Daughter states that patient lives with patient's son.  Says that they call her when they need her to get her to a doctor's visit etc. if they are busy. Reports that she has been having a congested cough for about 4 days now. Patient reports that she has not been having any congestion in her head or nose.  Has not been blowing much mucus from her nose. Had no sore throat.  No fevers or chills. No lower extremity edema.    Home Meds:   Outpatient Medications Prior to Visit  Medication Sig Dispense Refill  . atenolol (TENORMIN) 25 MG tablet TAKE 1 TABLET(25 MG) BY MOUTH DAILY 90 tablet 3  . clopidogrel (PLAVIX) 75 MG tablet Take 1 tablet (75 mg total) by mouth daily. 90 tablet 3  . DULoxetine (CYMBALTA) 30 MG capsule Take 30 mg by mouth 3 (three) times daily.   2  . gabapentin (NEURONTIN) 800 MG tablet TAKE 1 TABLET BY MOUTH EVERY MORNING AND AT LUNCH, 2 TABLETS BY MOUTH EVERY NIGHT AT BEDTIME 360 tablet 4  . hydrochlorothiazide (HYDRODIURIL) 25 MG tablet Take 1 tablet (25 mg total) by mouth daily. 90 tablet 1  . oxymorphone (OPANA) 5 MG tablet Take 5 mg by mouth 3 (three) times daily.    . rosuvastatin (CRESTOR) 40 MG tablet Take 1 tablet (40 mg total) by mouth daily. 90 tablet 3   No facility-administered medications prior to visit.     Allergies:  Allergies  Allergen Reactions  . Sulfonamide Derivatives Hives and Rash      Review of Systems: See HPI for pertinent ROS. All other ROS negative.    Physical Exam: Blood pressure 102/60, pulse (!) 103, temperature 98.2 F (36.8 C), temperature source Oral, resp. rate 14, weight 76.1 kg (167 lb 12.8 oz), SpO2 97 %., Body mass index  is 32.77 kg/m. General: Obese WF. appears in no acute distress. HEENT: Normocephalic, atraumatic, eyes without discharge, sclera non-icteric, nares are without discharge. Bilateral auditory canals clear, TM's are without perforation, pearly grey and translucent with reflective cone of light bilaterally. Oral cavity moist, posterior pharynx without exudate, erythema, peritonsillar abscess.  Neck: Supple. No thyromegaly. No lymphadenopathy. Lungs: Clear bilaterally to auscultation without wheezes, rales, or rhonchi. Breathing is unlabored.  There are no wheezes rhonchi or rales. Heart: Regular rhythm. No murmurs, rubs, or gallops. Msk:  Strength and tone normal for age. Extremities/Skin: Warm and dry. No LE edema.  Neuro: Alert and oriented X 3. Moves all extremities spontaneously. Gait is normal. CNII-XII grossly in tact. Psych:  Responds to questions appropriately with a normal affect.     ASSESSMENT AND PLAN:  84 y.o. year old female with  1. Bacterial respiratory infection Oxygen saturation is 97% on room air.  Lungs sound clear.  I do not hear any wheezes rhonchi or rales.  She has no lower extremity edema to indicate CHF. During the visit she does have a congested cough. Recommend that she take Mucinex DM in addition to antibiotic.  Take the azithromycin as directed.  Follow-up if symptoms worsen or if symptoms do not resolve within 1 week after  completion of antibiotic. - azithromycin (ZITHROMAX) 250 MG tablet; Day 1: Take 2 daily. Days 2 -5: Take 1 daily.  Dispense: 6 tablet; Refill: 0   Signed, [address] [place], [place], [facility] 06/29/2017 2:39 PM

## 2017-07-02 ENCOUNTER — Other Ambulatory Visit: Payer: Self-pay

## 2017-07-02 ENCOUNTER — Encounter (HOSPITAL_COMMUNITY): Payer: Self-pay | Admitting: Emergency Medicine

## 2017-07-02 DIAGNOSIS — Z8249 Family history of ischemic heart disease and other diseases of the circulatory system: Secondary | ICD-10-CM

## 2017-07-02 DIAGNOSIS — J40 Bronchitis, not specified as acute or chronic: Secondary | ICD-10-CM | POA: Diagnosis present

## 2017-07-02 DIAGNOSIS — B9620 Unspecified Escherichia coli [E. coli] as the cause of diseases classified elsewhere: Secondary | ICD-10-CM | POA: Diagnosis present

## 2017-07-02 DIAGNOSIS — I4891 Unspecified atrial fibrillation: Secondary | ICD-10-CM | POA: Diagnosis not present

## 2017-07-02 DIAGNOSIS — Z96641 Presence of right artificial hip joint: Secondary | ICD-10-CM | POA: Diagnosis present

## 2017-07-02 DIAGNOSIS — S20222A Contusion of left back wall of thorax, initial encounter: Secondary | ICD-10-CM | POA: Diagnosis not present

## 2017-07-02 DIAGNOSIS — S299XXA Unspecified injury of thorax, initial encounter: Secondary | ICD-10-CM | POA: Diagnosis not present

## 2017-07-02 DIAGNOSIS — G894 Chronic pain syndrome: Secondary | ICD-10-CM | POA: Diagnosis present

## 2017-07-02 DIAGNOSIS — N390 Urinary tract infection, site not specified: Principal | ICD-10-CM | POA: Diagnosis present

## 2017-07-02 DIAGNOSIS — M069 Rheumatoid arthritis, unspecified: Secondary | ICD-10-CM | POA: Diagnosis not present

## 2017-07-02 DIAGNOSIS — G4733 Obstructive sleep apnea (adult) (pediatric): Secondary | ICD-10-CM | POA: Diagnosis present

## 2017-07-02 DIAGNOSIS — I959 Hypotension, unspecified: Secondary | ICD-10-CM | POA: Diagnosis not present

## 2017-07-02 DIAGNOSIS — N200 Calculus of kidney: Secondary | ICD-10-CM | POA: Diagnosis present

## 2017-07-02 DIAGNOSIS — Z8673 Personal history of transient ischemic attack (TIA), and cerebral infarction without residual deficits: Secondary | ICD-10-CM

## 2017-07-02 DIAGNOSIS — Z96653 Presence of artificial knee joint, bilateral: Secondary | ICD-10-CM | POA: Diagnosis present

## 2017-07-02 DIAGNOSIS — S3991XA Unspecified injury of abdomen, initial encounter: Secondary | ICD-10-CM | POA: Diagnosis not present

## 2017-07-02 DIAGNOSIS — Z7902 Long term (current) use of antithrombotics/antiplatelets: Secondary | ICD-10-CM

## 2017-07-02 DIAGNOSIS — R42 Dizziness and giddiness: Secondary | ICD-10-CM | POA: Diagnosis not present

## 2017-07-02 DIAGNOSIS — S7002XA Contusion of left hip, initial encounter: Secondary | ICD-10-CM | POA: Diagnosis not present

## 2017-07-02 DIAGNOSIS — I10 Essential (primary) hypertension: Secondary | ICD-10-CM | POA: Diagnosis not present

## 2017-07-02 DIAGNOSIS — W1830XA Fall on same level, unspecified, initial encounter: Secondary | ICD-10-CM | POA: Diagnosis present

## 2017-07-02 DIAGNOSIS — Z79891 Long term (current) use of opiate analgesic: Secondary | ICD-10-CM

## 2017-07-02 DIAGNOSIS — S20212A Contusion of left front wall of thorax, initial encounter: Secondary | ICD-10-CM | POA: Diagnosis present

## 2017-07-02 DIAGNOSIS — Z882 Allergy status to sulfonamides status: Secondary | ICD-10-CM

## 2017-07-02 DIAGNOSIS — I714 Abdominal aortic aneurysm, without rupture: Secondary | ICD-10-CM | POA: Diagnosis present

## 2017-07-02 DIAGNOSIS — Z79899 Other long term (current) drug therapy: Secondary | ICD-10-CM

## 2017-07-02 DIAGNOSIS — S0990XA Unspecified injury of head, initial encounter: Secondary | ICD-10-CM | POA: Diagnosis not present

## 2017-07-02 DIAGNOSIS — E785 Hyperlipidemia, unspecified: Secondary | ICD-10-CM | POA: Diagnosis present

## 2017-07-02 NOTE — ED Triage Notes (Signed)
Patient states that she fell on her left side. Patient is left rib pain. Patient did not hit her head, she only hit her side. Patient states this is the second time she has fallen today. She does not know why she is falling.

## 2017-07-03 ENCOUNTER — Emergency Department (HOSPITAL_COMMUNITY): Payer: Medicare Other

## 2017-07-03 ENCOUNTER — Inpatient Hospital Stay (HOSPITAL_COMMUNITY): Payer: Medicare Other

## 2017-07-03 ENCOUNTER — Inpatient Hospital Stay (HOSPITAL_COMMUNITY)
Admission: EM | Admit: 2017-07-03 | Discharge: 2017-07-05 | DRG: 690 | Disposition: A | Discharge status: Home Health | Payer: Medicare Other | Attending: Internal Medicine | Admitting: Internal Medicine

## 2017-07-03 DIAGNOSIS — G4733 Obstructive sleep apnea (adult) (pediatric): Secondary | ICD-10-CM | POA: Diagnosis present

## 2017-07-03 DIAGNOSIS — W1830XA Fall on same level, unspecified, initial encounter: Secondary | ICD-10-CM | POA: Diagnosis present

## 2017-07-03 DIAGNOSIS — B9620 Unspecified Escherichia coli [E. coli] as the cause of diseases classified elsewhere: Secondary | ICD-10-CM | POA: Diagnosis present

## 2017-07-03 DIAGNOSIS — R10A2 Flank pain, left side: Secondary | ICD-10-CM

## 2017-07-03 DIAGNOSIS — I959 Hypotension, unspecified: Secondary | ICD-10-CM

## 2017-07-03 DIAGNOSIS — S20222A Contusion of left back wall of thorax, initial encounter: Secondary | ICD-10-CM

## 2017-07-03 DIAGNOSIS — Z8673 Personal history of transient ischemic attack (TIA), and cerebral infarction without residual deficits: Secondary | ICD-10-CM | POA: Diagnosis not present

## 2017-07-03 DIAGNOSIS — E785 Hyperlipidemia, unspecified: Secondary | ICD-10-CM | POA: Diagnosis present

## 2017-07-03 DIAGNOSIS — N200 Calculus of kidney: Secondary | ICD-10-CM | POA: Diagnosis not present

## 2017-07-03 DIAGNOSIS — R270 Ataxia, unspecified: Secondary | ICD-10-CM | POA: Diagnosis not present

## 2017-07-03 DIAGNOSIS — I4891 Unspecified atrial fibrillation: Secondary | ICD-10-CM

## 2017-07-03 DIAGNOSIS — J40 Bronchitis, not specified as acute or chronic: Secondary | ICD-10-CM | POA: Diagnosis present

## 2017-07-03 DIAGNOSIS — I10 Essential (primary) hypertension: Secondary | ICD-10-CM

## 2017-07-03 DIAGNOSIS — I714 Abdominal aortic aneurysm, without rupture, unspecified: Secondary | ICD-10-CM | POA: Diagnosis present

## 2017-07-03 DIAGNOSIS — S20212A Contusion of left front wall of thorax, initial encounter: Secondary | ICD-10-CM | POA: Diagnosis present

## 2017-07-03 DIAGNOSIS — R42 Dizziness and giddiness: Secondary | ICD-10-CM | POA: Diagnosis not present

## 2017-07-03 DIAGNOSIS — R7881 Bacteremia: Secondary | ICD-10-CM | POA: Diagnosis present

## 2017-07-03 DIAGNOSIS — W19XXXA Unspecified fall, initial encounter: Secondary | ICD-10-CM | POA: Diagnosis not present

## 2017-07-03 DIAGNOSIS — N390 Urinary tract infection, site not specified: Secondary | ICD-10-CM | POA: Diagnosis not present

## 2017-07-03 DIAGNOSIS — M069 Rheumatoid arthritis, unspecified: Secondary | ICD-10-CM | POA: Diagnosis present

## 2017-07-03 DIAGNOSIS — Y92009 Unspecified place in unspecified non-institutional (private) residence as the place of occurrence of the external cause: Secondary | ICD-10-CM

## 2017-07-03 DIAGNOSIS — Z96641 Presence of right artificial hip joint: Secondary | ICD-10-CM | POA: Diagnosis present

## 2017-07-03 DIAGNOSIS — Z7902 Long term (current) use of antithrombotics/antiplatelets: Secondary | ICD-10-CM | POA: Diagnosis not present

## 2017-07-03 DIAGNOSIS — S7002XA Contusion of left hip, initial encounter: Secondary | ICD-10-CM | POA: Diagnosis not present

## 2017-07-03 DIAGNOSIS — S299XXA Unspecified injury of thorax, initial encounter: Secondary | ICD-10-CM | POA: Diagnosis not present

## 2017-07-03 DIAGNOSIS — Z79899 Other long term (current) drug therapy: Secondary | ICD-10-CM | POA: Diagnosis not present

## 2017-07-03 DIAGNOSIS — S0990XA Unspecified injury of head, initial encounter: Secondary | ICD-10-CM | POA: Diagnosis not present

## 2017-07-03 DIAGNOSIS — Z882 Allergy status to sulfonamides status: Secondary | ICD-10-CM | POA: Diagnosis not present

## 2017-07-03 DIAGNOSIS — Z96653 Presence of artificial knee joint, bilateral: Secondary | ICD-10-CM | POA: Diagnosis present

## 2017-07-03 DIAGNOSIS — Z79891 Long term (current) use of opiate analgesic: Secondary | ICD-10-CM | POA: Diagnosis not present

## 2017-07-03 DIAGNOSIS — Z8249 Family history of ischemic heart disease and other diseases of the circulatory system: Secondary | ICD-10-CM | POA: Diagnosis not present

## 2017-07-03 DIAGNOSIS — S3991XA Unspecified injury of abdomen, initial encounter: Secondary | ICD-10-CM | POA: Diagnosis not present

## 2017-07-03 DIAGNOSIS — R109 Unspecified abdominal pain: Secondary | ICD-10-CM

## 2017-07-03 DIAGNOSIS — G894 Chronic pain syndrome: Secondary | ICD-10-CM | POA: Diagnosis not present

## 2017-07-03 LAB — CBC
HCT: 41.3 % (ref 36.0–46.0)
Hemoglobin: 13 g/dL (ref 12.0–15.0)
MCH: 29.8 pg (ref 26.0–34.0)
MCHC: 31.5 g/dL (ref 30.0–36.0)
MCV: 94.7 fL (ref 78.0–100.0)
Platelets: 211 10*3/uL (ref 150–400)
RBC: 4.36 MIL/uL (ref 3.87–5.11)
RDW: 14.4 % (ref 11.5–15.5)
WBC: 8.9 10*3/uL (ref 4.0–10.5)

## 2017-07-03 LAB — URINALYSIS, ROUTINE W REFLEX MICROSCOPIC
Bilirubin Urine: NEGATIVE
Glucose, UA: NEGATIVE mg/dL
Hgb urine dipstick: NEGATIVE
Ketones, ur: NEGATIVE mg/dL
Nitrite: POSITIVE — AB
Protein, ur: NEGATIVE mg/dL
Specific Gravity, Urine: >1.046 — ABNORMAL HIGH (ref 1.005–1.030)
pH: 5 (ref 5.0–8.0)

## 2017-07-03 LAB — ECHOCARDIOGRAM COMPLETE

## 2017-07-03 LAB — TSH: TSH: 1.199 u[IU]/mL (ref 0.350–4.500)

## 2017-07-03 LAB — BASIC METABOLIC PANEL
Anion gap: 11 (ref 5–15)
BUN: 17 mg/dL (ref 6–20)
CO2: 31 mmol/L (ref 22–32)
Calcium: 9.1 mg/dL (ref 8.9–10.3)
Chloride: 101 mmol/L (ref 101–111)
Creatinine, Ser: 0.9 mg/dL (ref 0.44–1.00)
GFR calc Af Amer: >60 mL/min (ref >60)
GFR calc non Af Amer: 58 mL/min — ABNORMAL LOW (ref >60)
Glucose, Bld: 141 mg/dL — ABNORMAL HIGH (ref 65–99)
Potassium: 3.6 mmol/L (ref 3.5–5.1)
Sodium: 143 mmol/L (ref 135–145)

## 2017-07-03 LAB — I-STAT TROPONIN, ED: Troponin i, poc: 0 ng/mL (ref 0.00–0.08)

## 2017-07-03 LAB — PROCALCITONIN: Procalcitonin: <0.1 ng/mL

## 2017-07-03 MED ORDER — SODIUM CHLORIDE 0.9 % IV SOLN
1.0000 g | Freq: Once | INTRAVENOUS | Status: AC
  Administered 2017-07-03: 1 g via INTRAVENOUS
  Filled 2017-07-03: qty 10

## 2017-07-03 MED ORDER — SODIUM CHLORIDE 0.9 % IJ SOLN
INTRAMUSCULAR | Status: AC
  Filled 2017-07-03: qty 50

## 2017-07-03 MED ORDER — DULOXETINE HCL 30 MG PO CPEP
30.0000 mg | ORAL_CAPSULE | Freq: Three times a day (TID) | ORAL | Status: DC
  Administered 2017-07-03 – 2017-07-05 (×7): 30 mg via ORAL
  Filled 2017-07-03 (×7): qty 1

## 2017-07-03 MED ORDER — HYDROCODONE-ACETAMINOPHEN 5-325 MG PO TABS
1.0000 | ORAL_TABLET | Freq: Four times a day (QID) | ORAL | Status: DC | PRN
  Administered 2017-07-03 – 2017-07-04 (×2): 1 via ORAL
  Filled 2017-07-03 (×2): qty 1

## 2017-07-03 MED ORDER — IOPAMIDOL (ISOVUE-300) INJECTION 61%
100.0000 mL | Freq: Once | INTRAVENOUS | Status: AC | PRN
  Administered 2017-07-03: 100 mL via INTRAVENOUS

## 2017-07-03 MED ORDER — FENTANYL CITRATE (PF) 100 MCG/2ML IJ SOLN
50.0000 ug | Freq: Once | INTRAMUSCULAR | Status: AC
  Administered 2017-07-03: 50 ug via INTRAVENOUS
  Filled 2017-07-03: qty 2

## 2017-07-03 MED ORDER — ATENOLOL 25 MG PO TABS
25.0000 mg | ORAL_TABLET | Freq: Every day | ORAL | Status: DC
  Administered 2017-07-03 – 2017-07-05 (×3): 25 mg via ORAL
  Filled 2017-07-03 (×3): qty 1

## 2017-07-03 MED ORDER — ROSUVASTATIN CALCIUM 20 MG PO TABS
40.0000 mg | ORAL_TABLET | Freq: Every day | ORAL | Status: DC
  Administered 2017-07-03 – 2017-07-05 (×3): 40 mg via ORAL
  Filled 2017-07-03: qty 1
  Filled 2017-07-03 (×2): qty 2

## 2017-07-03 MED ORDER — METHOCARBAMOL 500 MG PO TABS: 500.0000 mg | ORAL_TABLET | Freq: Three times a day (TID) | ORAL | Status: DC | PRN

## 2017-07-03 MED ORDER — MORPHINE SULFATE 15 MG PO TABS: 15.0000 mg | ORAL_TABLET | Freq: Three times a day (TID) | ORAL | Status: DC | PRN

## 2017-07-03 MED ORDER — GABAPENTIN 800 MG PO TABS: 800.0000 mg | ORAL_TABLET | Freq: Three times a day (TID) | ORAL | Status: DC

## 2017-07-03 MED ORDER — IPRATROPIUM-ALBUTEROL 0.5-2.5 (3) MG/3ML IN SOLN: 3.0000 mL | Freq: Four times a day (QID) | RESPIRATORY_TRACT | Status: DC | PRN

## 2017-07-03 MED ORDER — GABAPENTIN 400 MG PO CAPS
1600.0000 mg | ORAL_CAPSULE | Freq: Every day | ORAL | Status: DC
  Administered 2017-07-03 – 2017-07-04 (×2): 1600 mg via ORAL
  Filled 2017-07-03 (×2): qty 4

## 2017-07-03 MED ORDER — ALBUTEROL SULFATE (2.5 MG/3ML) 0.083% IN NEBU: 2.5000 mg | INHALATION_SOLUTION | Freq: Four times a day (QID) | RESPIRATORY_TRACT | Status: DC | PRN

## 2017-07-03 MED ORDER — ACETAMINOPHEN 325 MG PO TABS: 650.0000 mg | ORAL_TABLET | Freq: Four times a day (QID) | ORAL | Status: DC | PRN

## 2017-07-03 MED ORDER — IPRATROPIUM-ALBUTEROL 0.5-2.5 (3) MG/3ML IN SOLN
3.0000 mL | Freq: Four times a day (QID) | RESPIRATORY_TRACT | Status: DC
  Administered 2017-07-03: 3 mL via RESPIRATORY_TRACT
  Filled 2017-07-03: qty 3

## 2017-07-03 MED ORDER — ENOXAPARIN SODIUM 40 MG/0.4ML ~~LOC~~ SOLN
40.0000 mg | SUBCUTANEOUS | Status: DC
  Administered 2017-07-03 – 2017-07-04 (×2): 40 mg via SUBCUTANEOUS
  Filled 2017-07-03 (×3): qty 0.4

## 2017-07-03 MED ORDER — ACETAMINOPHEN 650 MG RE SUPP: 650.0000 mg | Freq: Four times a day (QID) | RECTAL | Status: DC | PRN

## 2017-07-03 MED ORDER — IOPAMIDOL (ISOVUE-300) INJECTION 61%
INTRAVENOUS | Status: AC
  Filled 2017-07-03: qty 100

## 2017-07-03 MED ORDER — CLOPIDOGREL BISULFATE 75 MG PO TABS
75.0000 mg | ORAL_TABLET | Freq: Every day | ORAL | Status: DC
  Administered 2017-07-03 – 2017-07-05 (×3): 75 mg via ORAL
  Filled 2017-07-03 (×3): qty 1

## 2017-07-03 MED ORDER — GABAPENTIN 400 MG PO CAPS
800.0000 mg | ORAL_CAPSULE | Freq: Two times a day (BID) | ORAL | Status: DC
  Administered 2017-07-03 – 2017-07-05 (×5): 800 mg via ORAL
  Filled 2017-07-03 (×6): qty 2

## 2017-07-03 MED ORDER — DM-GUAIFENESIN ER 30-600 MG PO TB12
1.0000 | ORAL_TABLET | Freq: Two times a day (BID) | ORAL | Status: DC
  Administered 2017-07-03 – 2017-07-05 (×5): 1 via ORAL
  Filled 2017-07-03 (×6): qty 1

## 2017-07-03 MED ORDER — ONDANSETRON HCL 4 MG/2ML IJ SOLN: 4.0000 mg | Freq: Four times a day (QID) | INTRAMUSCULAR | Status: DC | PRN

## 2017-07-03 MED ORDER — ONDANSETRON HCL 4 MG PO TABS: 4.0000 mg | ORAL_TABLET | Freq: Four times a day (QID) | ORAL | Status: DC | PRN

## 2017-07-03 MED ORDER — SODIUM CHLORIDE 0.9 % IV SOLN
1.0000 g | INTRAVENOUS | Status: DC
  Administered 2017-07-04 – 2017-07-05 (×2): 1 g via INTRAVENOUS
  Filled 2017-07-03 (×2): qty 1

## 2017-07-03 MED ORDER — AZITHROMYCIN 250 MG PO TABS
500.0000 mg | ORAL_TABLET | Freq: Every day | ORAL | Status: DC
  Administered 2017-07-03 – 2017-07-05 (×3): 500 mg via ORAL
  Filled 2017-07-03 (×3): qty 2

## 2017-07-03 MED ORDER — SODIUM CHLORIDE 0.9 % IV BOLUS (SEPSIS)
500.0000 mL | Freq: Once | INTRAVENOUS | Status: AC
  Administered 2017-07-03: 500 mL via INTRAVENOUS

## 2017-07-03 NOTE — ED Notes (Signed)
Unable to collect blood culture [place] at bedside

## 2017-07-03 NOTE — ED Notes (Signed)
.  edhandoff

## 2017-07-03 NOTE — Progress Notes (Signed)
[facility] PLAN OF CARE NOTE Patient: [name] OIL:[id number]   PCP: [name], MD DOB: May 20, 1933   DOA: 07/03/2017   DOS: 07/03/2017    Patient was admitted by my colleague Dr. Tamala Julian earlier on 07/03/2017. I have reviewed the H&P as well as assessment and plan and agree with the same. Important changes in the plan are listed below.  Plan of care: Principal Problem:   Fall at home, initial encounter Active Problems:   Essential hypertension   [name] (abdominal aortic aneurysm) (HCC)   Chronic pain syndrome   UTI (urinary tract infection)   A-fib ([place]) MRI negative for acute stroke. Denies any active bleeding.  No contraindication to anticoagulant agent.  Family will think about overnight.  Author: Berle Mull, MD [facility] Pager: [phone] 07/03/2017 8:55 PM   If 7PM-7AM, please contact night-coverage at [email or web address], password [facility]

## 2017-07-03 NOTE — Progress Notes (Signed)
PHARMACY NOTE -  ANTIBIOTIC RENAL DOSE ADJUSTMENT   Request received for Pharmacy to assist with antibiotic renal dose adjustment.  Patient has been initiated on Ceftriaxone 1gm iv q24hr for UTI. SCr 0.9, estimated CrCl 44 ml/min Current dosage is appropriate and need for further dosage adjustment appears unlikely at present. Will sign off at this time.  Please reconsult if a change in clinical status warrants re-evaluation of dosage.

## 2017-07-03 NOTE — ED Notes (Signed)
Labs drawn in triage (lt. Green, lav, blue, two golds)

## 2017-07-03 NOTE — ED Notes (Signed)
CONTINUE ON TELEMETRY BED ASSIGNMENT

## 2017-07-03 NOTE — H&P (Signed)
History and Physical    [name] ATF:[id number] DOB: May 15, 1933 DOA: 07/03/2017  Referring MD/NP/PA: Theodoro Grist, PA-C PCP: [name], MD  Patient coming from: Home  Chief Complaint: Fall  I have personally briefly reviewed patient's old medical records in [place]   HPI: [name] is a 84 y.o. female with medical history significant of HTN, [name], chronic pain, [name] ,prediabetes, arthritis, [name]; [name]  Patient reports not knowing what caused her to fall.  Denies any loss of consciousness, lightheadedness, chest pain, palpitations, or trauma to her head.  She did strike the left side of her chest and complains of some tenderness there.  Over the last day or so she had some generalized weakness. She had gone to her primary care provider for a cough and had been prescribed and completed a course of azithromycin just a few days ago.  Denies having any fever, chills, dysuria, abdominal pain, nausea, vomiting, diarrhea, or p.o. intake.  Her son who lives at home with her heard her fall but did not witness it and came upstairs and helped her up.  Patient makes note that she has heard that she has had any irregular heart rhythm during her previous hospitalization in October.  She reports being on Plavix for history of TIAs.  ED Course: Upon admission into the emergency department patient was noted to be afebrile with relatively normal vital signs and negative orthostatics vital signs.  Labs are unremarkable.  X-rays of her hip and chest were negative for any acute abnormalities.  CT scan brain showed no acute abnormalities.  CT of the abdomen showed a 3.8 cm abdominal aortic aneurysm.  Patient noted to be in atrial fibrillation on EKG.   Review of Systems  Genitourinary: Negative for dysuria.  Musculoskeletal: Positive for falls.       Positive for chest wall pain    Past Medical History:    Diagnosis Date  . [name] (abdominal aortic aneurysm) ([place]) 09/2013   3.6 cm  . Dementia   . Diabetes mellitus    pre-diabetes  . Diverticulosis   . Hyperlipidemia   . Hypertension   . Nephrolithiasis   . OSA on CPAP   . Osteoarthritis   . Osteopenia    T=  -2.1 in hip  . Rheumatoid arthritis(714.0)   . S/P carpal tunnel release   . Spinal stenosis     Past Surgical History:  Procedure Laterality Date  . APPENDECTOMY    . CARPAL TUNNEL RELEASE  12/27/2006   Right subcutaneous ulnar nerve transfer -- Right open carpal tunnel release  . CHOLECYSTECTOMY    . FOOT SURGERY    . JOINT REPLACEMENT     BTKR, RTHR  . TOTAL KNEE ARTHROPLASTY  08/21/2007   Right total knee replacement     reports that  has never smoked. she has never used smokeless tobacco. She reports that she does not drink alcohol or use drugs.  Allergies  Allergen Reactions  . Sulfonamide Derivatives Hives and Rash    Family History  Problem Relation Age of Onset  . Stroke Father   . Coronary artery disease Son        CABG at age 41  . Heart disease Son   . Heart attack Son   . Stroke Mother   . Hypertension Sister   . Diabetes Brother   . Hypertension Maternal Aunt  Prior to Admission medications   Medication Sig Start Date End Date Taking? Authorizing Provider  atenolol (TENORMIN) 25 MG tablet TAKE 1 TABLET(25 MG) BY MOUTH DAILY 04/05/17  Yes [name], MD  clopidogrel (PLAVIX) 75 MG tablet Take 1 tablet (75 mg total) by mouth daily. 01/05/17  Yes [name], MD  DULoxetine (CYMBALTA) 30 MG capsule Take 30 mg by mouth 3 (three) times daily.  07/29/14  Yes [provider]  gabapentin (NEURONTIN) 800 MG tablet TAKE 1 TABLET BY MOUTH EVERY MORNING AND AT LUNCH, 2 TABLETS BY MOUTH EVERY NIGHT AT BEDTIME 10/05/16  Yes Hyatt, Max T, DPM  hydrochlorothiazide (HYDRODIURIL) 25 MG tablet Take 1 tablet (25 mg total) by mouth daily. 02/09/17  Yes [name], MD  oxymorphone (OPANA) 5  MG tablet Take 5 mg by mouth 3 (three) times daily.   Yes [provider]  rosuvastatin (CRESTOR) 40 MG tablet Take 1 tablet (40 mg total) by mouth daily. 01/05/17  Yes [name], MD  azithromycin (ZITHROMAX) 250 MG tablet Day 1: Take 2 daily. Days 2 -5: Take 1 daily. Patient not taking: Reported on 07/03/2017 06/29/17   Orlena Sheldon, PA-C    Physical Exam:  Constitutional: NAD, calm, comfortable Vitals:   07/02/17 2352 07/02/17 2353 07/03/17 0301  BP: 102/79  106/75  Pulse: 86  84  Resp: 13  17  Temp: 98.1 F (36.7 C)    TempSrc: Oral    SpO2: 96%  94%  Weight:  75.8 kg (167 lb)   Height:  5' (1.524 m)    Eyes: PERRL, lids and conjunctivae normal ENMT: Mucous membranes are moist. Posterior pharynx clear of any exudate or lesions. Neck: normal, supple, no masses, no thyromegaly Respiratory: clear to auscultation bilaterally, no wheezing, no crackles. Normal respiratory effort. No accessory muscle use.  Cardiovascular: Irregular irregular, no murmurs / rubs / gallops. Trace extremity edema. 2+ pedal pulses. No carotid bruits.  Tenderness to the left chest wall.  Female who appears to be in Abdomen: no tenderness, no masses palpated. No hepatosplenomegaly. Bowel sounds positive.  Musculoskeletal: no clubbing / cyanosis. No joint deformity upper and lower extremities. Good ROM, no contractures. Normal muscle tone.  Skin: no rashes, lesions, ulcers. No induration Neurologic: CN 2-12 grossly intact. Sensation intact, DTR normal. Strength 5/5 in all 4.  Psychiatric: Normal judgment and insight. Alert and oriented x 3. Normal mood.     Labs on Admission: I have personally reviewed following labs and imaging studies  CBC: Recent Labs  Lab 07/03/17 0033  WBC 8.9  HGB 13.0  HCT 41.3  MCV 94.7  PLT 503   Basic Metabolic Panel: Recent Labs  Lab 07/03/17 0033  NA 143  K 3.6  CL 101  CO2 31  GLUCOSE 141*  BUN 17  CREATININE 0.90  CALCIUM 9.1   GFR: Estimated  Creatinine Clearance: 44.6 mL/min (by C-G formula based on SCr of 0.9 mg/dL). Liver Function Tests: No results for input(s): AST, ALT, ALKPHOS, BILITOT, PROT, ALBUMIN in the last 168 hours. No results for input(s): LIPASE, AMYLASE in the last 168 hours. No results for input(s): AMMONIA in the last 168 hours. Coagulation Profile: No results for input(s): INR, PROTIME in the last 168 hours. Cardiac Enzymes: No results for input(s): CKTOTAL, CKMB, CKMBINDEX, TROPONINI in the last 168 hours. BNP (last 3 results) No results for input(s): PROBNP in the last 8760 hours. HbA1C: No results for input(s): HGBA1C in the last 72 hours.  CBG: No results for input(s): GLUCAP in the last 168 hours. Lipid Profile: No results for input(s): CHOL, HDL, LDLCALC, TRIG, CHOLHDL, LDLDIRECT in the last 72 hours. Thyroid Function Tests: No results for input(s): TSH, T4TOTAL, FREET4, T3FREE, THYROIDAB in the last 72 hours. Anemia Panel: No results for input(s): VITAMINB12, FOLATE, FERRITIN, TIBC, IRON, RETICCTPCT in the last 72 hours. Urine analysis:    Component Value Date/Time   COLORURINE YELLOW 02/02/2017 0103   APPEARANCEUR CLOUDY (A) 02/02/2017 0103   LABSPEC 1.016 02/02/2017 0103   PHURINE 6.0 02/02/2017 0103   GLUCOSEU NEGATIVE 02/02/2017 0103   GLUCOSEU NEG mg/dL 03/08/2007 2031   HGBUR MODERATE (A) 02/02/2017 0103   HGBUR negative 03/06/2008 0846   BILIRUBINUR NEGATIVE 02/02/2017 0103   KETONESUR NEGATIVE 02/02/2017 0103   PROTEINUR 100 (A) 02/02/2017 0103   UROBILINOGEN 0.2 09/20/2013 1535   NITRITE POSITIVE (A) 02/02/2017 0103   LEUKOCYTESUR LARGE (A) 02/02/2017 0103   Sepsis Labs: No results found for this or any previous visit (from the past 240 hour(s)).   Radiological Exams on Admission: Dg Chest 2 View  Result Date: 07/03/2017 CLINICAL DATA:  Rib injury EXAM: CHEST  2 VIEW COMPARISON:  02/02/17 FINDINGS: The heart size and mediastinal contours are within normal limits. Both lungs are  clear. The visualized skeletal structures are unremarkable. IMPRESSION: No active cardiopulmonary disease. Electronically Signed   By: Ulyses Jarred M.D.   On: 07/03/2017 00:39   Ct Head Wo Contrast  Result Date: 07/03/2017 CLINICAL DATA:  Fall, on blood thinners.  Question syncope. EXAM: CT HEAD WITHOUT CONTRAST TECHNIQUE: Contiguous axial images were obtained from the base of the skull through the vertex without intravenous contrast. COMPARISON:  Brain MRI 01/01/2017 FINDINGS: Brain: Unchanged atrophy and chronic small vessel ischemia. No intracranial hemorrhage, mass effect, or midline shift. No hydrocephalus. The basilar cisterns are patent. No evidence of territorial infarct or acute ischemia. Scattered gyral calcifications. No extra-axial or intracranial fluid collection. Vascular: Advanced atherosclerosis of skull base vasculature. No hyperdense vessel. Skull: No fracture or focal lesion. Sinuses/Orbits: Frontal sinuses and left mastoid air cells slightly hypoplastic. No acute finding. Intramuscular calcifications about the right infratemporal fossa. Other: None. IMPRESSION: 1.  No acute intracranial abnormality.  No skull fracture. 2. Unchanged atrophy and chronic small vessel ischemia from prior MRI. Electronically Signed   By: Jeb Levering M.D.   On: 07/03/2017 02:23   Ct Abdomen Pelvis W Contrast  Result Date: 07/03/2017 CLINICAL DATA:  Fall with rib pain. EXAM: CT ABDOMEN AND PELVIS WITH CONTRAST TECHNIQUE: Multidetector CT imaging of the abdomen and pelvis was performed using the standard protocol following bolus administration of intravenous contrast. CONTRAST:  100 mL Isovue-300 COMPARISON:  09/13/2006 FINDINGS: Lower chest: No basilar pulmonary nodules or pleural effusion. No apical pericardial effusion. Hepatobiliary: Normal hepatic contours and density. No visible biliary dilatation. Normal gallbladder. Pancreas: Normal parenchymal contours without ductal dilatation. No peripancreatic  fluid collection. Spleen: Normal. Adrenals/Urinary Tract: --Adrenal glands: Normal. --Right kidney/ureter: 5 mm lower pole nonobstructive calculus. Lower pole renal cyst measures 2.3 cm. --Left kidney/ureter: No hydronephrosis, perinephric stranding or nephrolithiasis. No obstructing ureteral stones. --Urinary bladder: Normal appearance for the degree of distention. Stomach/Bowel: --Stomach/Duodenum: No hiatal hernia or other gastric abnormality. Normal duodenal course. --Small bowel: No dilatation or inflammation. --Colon: No focal abnormality. --Appendix: Normal. Vascular/Lymphatic: Juxtarenal abdominal aortic aneurysm measures 3.8 cm. Extensive atherosclerotic calcification of the abdominal aorta and its branch vessels. No abdominal or pelvic lymphadenopathy. Reproductive: No free fluid in the pelvis. Musculoskeletal.  Grade 1 retrolisthesis at L2-L3 and L3-L4 with grade 1 anterolisthesis at L4-L5. No acute fracture. Other: None. IMPRESSION: 1. No acute abnormality. 2. Juxtarenal abdominal aortic aneurysm measuring 3.8 cm, new compared to 09/13/2006. Recommend followup by ultrasound in 2 years. This recommendation follows ACR consensus guidelines: White Paper of the ACR Incidental Findings Committee II on Vascular Findings. J Am Coll Radiol 2013; 10:789-794. 3. Nonobstructive right lower pole nephrolithiasis. Electronically Signed   By: Ulyses Jarred M.D.   On: 07/03/2017 02:33   Dg Hip Unilat W Or Wo Pelvis 2-3 Views Left  Result Date: 07/03/2017 CLINICAL DATA:  Fall with bruising to the left hip EXAM: DG HIP (WITH OR WITHOUT PELVIS) 2-3V LEFT COMPARISON:  None. FINDINGS: Vascular calcifications. Pubic symphysis and rami are intact. No acute displaced fracture or malalignment is seen. Minimal degenerative change. IMPRESSION: No acute osseous abnormality Electronically Signed   By: Donavan Foil M.D.   On: 07/03/2017 01:58    EKG: Independently reviewed.  Atrial fibrillation at 91 bpm  Assessment/Plan Fall  with left chest contusion: Acute.  Patient reports not knowing the cause of her fall. - Admit to a telemetry bed - Incentive spirometry - PT to ambulate  Urinary tract infection: Acute.  Patient found to have urinalysis with positive nitrites, small leukocytes, rare bacteria, and 6-30 WBCs.  Patient was started on Rocephin IV - Check urine culture - Continue Rocephin IV  Atrial fibrillation: Patient presents in atrial fibrillation appears rate controlled.  CHADsVASc score equals at least 6. Appears to be somewhat of a high falls risk.  Has-bled score equals at least 3.  Patient seen previously go into atrial fibrillation transiently as a result of infection during her 01/2017 admission. - Add on TSH - Check echocardiogram -  May want to discuss with cardiology in a.m.  Essential hypertension - Continue atenolol - Held hydralazine restart when medically appropriate  Chronic pain - Continue Cymbalta and morphine substituted for Opana, but did not schedule dosing.  History of [name] - Continue Plavix   Hyperlipidemia - Continue Crestor  OSA: not on CPAP  [name]: Stable.  Incidentally noted on CT scan of the chest measuring 3.8 cm.  Previously noted to be 3.6 cm in 2015. - Follow-up repeat ultrasound recommended in 2 years   Nephrolithiasis: Incidental finding on CT of the abdomen noting right nephrolithiasis without obstruction.  DVT prophylaxis: lovenox Code Status: Full  Family Communication: Discussed plan of care with the patient and family present Disposition Plan: tbd  Consults called: none Admission status: observatiomn  Norval Morton MD [facility] Pager [phone]   If 7PM-7AM, please contact night-coverage [email or web address] Password [facility]  07/03/2017, 4:17 AM

## 2017-07-03 NOTE — ED Provider Notes (Addendum)
[facility] DEPT Provider Note   CSN: [id number] Arrival date & time: 07/02/17  2235     History   Chief Complaint Chief Complaint  Patient presents with  . Fall    HPI [name] is a 84 y.o. female with a hx of [name], mild dementia, non-insulin-dependent diabetes, hyperlipidemia, hypertension arthritis, carpal tunnel disease, spinal stenosis presents to the Emergency Department complaining of gradual, persistent, progressively worsening left flank pain onset around 5 PM after fall.  Patient reports she was walking down the hallway when she fell to the floor striking her left side.  Patient initially states she does not remember why she fell and then tells me that she does remember the fall but does not know why she fell.  She reports she has had dizzy episodes in the past but does not remember feeling dizzy.  She denies loss of consciousness or striking her head. Associated symptoms include recurrent falls over the last several days.  No known aggravating or alleviating factors.  Patient denies fever, chills, headache, neck pain, chest pain, shortness of breath, abdominal pain, nausea, vomiting, diarrhea, weakness, dysuria, hematuria.  Patient son at bedside reports he heard her fall and heard her yell but did not witness the fall.  She was conscious when he got to her.  Patient reports she does take a blood thinner and record review shows that she is taking Plavix.   The history is provided by the patient, medical records and a relative. No language interpreter was used.    Past Medical History:  Diagnosis Date  . [name] (abdominal aortic aneurysm) ([place]) 09/2013   3.6 cm  . Dementia   . Diabetes mellitus    pre-diabetes  . Diverticulosis   . Hyperlipidemia   . Hypertension   . Nephrolithiasis   . OSA on CPAP   . Osteoarthritis   . Osteopenia    T=  -2.1 in hip  . Rheumatoid arthritis(714.0)   . S/P carpal tunnel release   . Spinal stenosis      Patient Active Problem List   Diagnosis Date Noted  . Tachycardia 02/03/2017  . Sepsis secondary to UTI ([place]) 02/02/2017  . Chronic pain syndrome 02/02/2017  . Abnormal EKG 11/21/2013  . [name] (abdominal aortic aneurysm) ([place]) 09/30/2013  . OSA (obstructive sleep apnea) 06/29/2012  . Dementia 06/29/2012  . Pre-diabetes 06/29/2012  . Hypoxia, sleep related 06/29/2012  . RA (rheumatoid arthritis) ([place]) 06/29/2012  . Nonspecific abnormal results of cardiovascular function study 06/02/2011  . Nonspecific abnormal electrocardiogram (ECG) (EKG) 05/18/2011  . OSTEOPOROSIS 04/09/2008  . FLANK PAIN, RIGHT 03/04/2008  . Hyperlipidemia 05/12/2006  . PYELONEPHRITIS, ACUTE 05/12/2006  . OSTEOARTHRITIS 05/12/2006  . DEPENDENT EDEMA, LEGS, BILATERAL 05/12/2006  . URINARY INCONTINENCE 05/12/2006  . PERIPHERAL NEUROPATHY 03/11/2006  . Essential hypertension 03/11/2006  . DYSURIA 03/11/2006    Past Surgical History:  Procedure Laterality Date  . APPENDECTOMY    . CARPAL TUNNEL RELEASE  12/27/2006   Right subcutaneous ulnar nerve transfer -- Right open carpal tunnel release  . CHOLECYSTECTOMY    . FOOT SURGERY    . JOINT REPLACEMENT     BTKR, RTHR  . TOTAL KNEE ARTHROPLASTY  08/21/2007   Right total knee replacement    OB History    No data available       Home Medications    Prior to Admission medications   Medication Sig Start Date End Date Taking? Authorizing Provider  atenolol (TENORMIN) 25 MG  tablet TAKE 1 TABLET(25 MG) BY MOUTH DAILY 04/05/17  Yes [name], MD  clopidogrel (PLAVIX) 75 MG tablet Take 1 tablet (75 mg total) by mouth daily. 01/05/17  Yes [name], MD  DULoxetine (CYMBALTA) 30 MG capsule Take 30 mg by mouth 3 (three) times daily.  07/29/14  Yes [provider]  gabapentin (NEURONTIN) 800 MG tablet TAKE 1 TABLET BY MOUTH EVERY MORNING AND AT LUNCH, 2 TABLETS BY MOUTH EVERY NIGHT AT BEDTIME 10/05/16  Yes Hyatt, Max T, DPM   hydrochlorothiazide (HYDRODIURIL) 25 MG tablet Take 1 tablet (25 mg total) by mouth daily. 02/09/17  Yes [name], MD  oxymorphone (OPANA) 5 MG tablet Take 5 mg by mouth 3 (three) times daily.   Yes [provider]  rosuvastatin (CRESTOR) 40 MG tablet Take 1 tablet (40 mg total) by mouth daily. 01/05/17  Yes [name], MD  azithromycin (ZITHROMAX) 250 MG tablet Day 1: Take 2 daily. Days 2 -5: Take 1 daily. Patient not taking: Reported on 07/03/2017 06/29/17   Orlena Sheldon, PA-C    Family History Family History  Problem Relation Age of Onset  . Stroke Father   . Coronary artery disease Son        CABG at age 72  . Heart disease Son   . Heart attack Son   . Stroke Mother   . Hypertension Sister   . Diabetes Brother   . Hypertension Maternal Aunt     Social History Social History   Tobacco Use  . Smoking status: Never Smoker  . Smokeless tobacco: Never Used  Substance Use Topics  . Alcohol use: No  . Drug use: No     Allergies   Sulfonamide derivatives   Review of Systems Review of Systems  Constitutional: Negative for appetite change, diaphoresis, fatigue, fever and unexpected weight change.  HENT: Negative for mouth sores.   Eyes: Negative for visual disturbance.  Respiratory: Negative for cough, chest tightness, shortness of breath and wheezing.   Cardiovascular: Negative for chest pain.  Gastrointestinal: Negative for abdominal pain, constipation, diarrhea, nausea and vomiting.  Endocrine: Negative for polydipsia, polyphagia and polyuria.  Genitourinary: Positive for flank pain. Negative for dysuria, frequency, hematuria and urgency.  Musculoskeletal: Negative for back pain and neck stiffness.  Skin: Negative for rash.  Allergic/Immunologic: Negative for immunocompromised state.  Neurological: Negative for syncope, light-headedness and headaches.  Hematological: Does not bruise/bleed easily.  Psychiatric/Behavioral: Negative for sleep  disturbance. The patient is not nervous/anxious.      Physical Exam Updated Vital Signs BP 102/79 (BP Location: Right Arm)   Pulse 86   Temp 98.1 F (36.7 C) (Oral)   Resp 13   Ht 5' (1.524 m)   Wt 75.8 kg (167 lb)   SpO2 96%   BMI 32.61 kg/m   Physical Exam  Constitutional: She appears well-developed and well-nourished. No distress.  Awake, alert, nontoxic appearance  HENT:  Head: Normocephalic and atraumatic.  Mouth/Throat: Oropharynx is clear and moist. No oropharyngeal exudate.  Eyes: Conjunctivae are normal. No scleral icterus.  Neck: Normal range of motion. Neck supple. No tracheal tenderness, no spinous process tenderness and no muscular tenderness present.  Cardiovascular: Normal rate and intact distal pulses. An irregularly irregular rhythm present.  Pulmonary/Chest: Effort normal and breath sounds normal. No respiratory distress. She has no wheezes. She exhibits tenderness.  Equal chest expansion Moderate tenderness to palpation of the left lower ribs Breath sounds clear and equal  Abdominal: Soft.  Bowel sounds are normal. She exhibits no mass. There is tenderness ( Left flank). There is no rebound and no guarding.  Tenderness to palpation along the left flank with obvious contusion and forming hematoma abrasion  Musculoskeletal: Normal range of motion. She exhibits no edema.       Cervical back: She exhibits normal range of motion, no tenderness, no deformity and no pain.       Thoracic back: She exhibits normal range of motion, no tenderness, no deformity and no pain.       Lumbar back: She exhibits normal range of motion, no tenderness, no deformity and no pain.  Full range of motion of the left hip without pain. Area of ecchymosis over the lateral left hip.  Neurological: She is alert.  Speech is clear and goal oriented Moves extremities without ataxia Sensation intact to all extremities Strength 5/5 in the bilateral upper and lower extremities  Skin: Skin is  warm and dry. She is not diaphoretic.  Psychiatric: She has a normal mood and affect.  Nursing note and vitals reviewed.    ED Treatments / Results  Labs (all labs ordered are listed, but only abnormal results are displayed) Labs Reviewed  BASIC METABOLIC PANEL - Abnormal; Notable for the following components:      Result Value   Glucose, Bld 141 (*)    GFR calc non Af Amer 58 (*)    All other components within normal limits  URINALYSIS, ROUTINE W REFLEX MICROSCOPIC - Abnormal; Notable for the following components:   Specific Gravity, Urine >1.046 (*)    Nitrite POSITIVE (*)    Leukocytes, UA SMALL (*)    Bacteria, UA RARE (*)    Squamous Epithelial / LPF 0-5 (*)    All other components within normal limits  URINE CULTURE  CBC  I-STAT TROPONIN, ED    EKG  EKG Interpretation  Date/Time:  Monday July 03 2017 02:34:07 EST Ventricular Rate:  91 PR Interval:    QRS Duration: 82 QT Interval:  349 QTC Calculation: 430 R Axis:   24 Text Interpretation:  Atrial fibrillation Low voltage, precordial leads Baseline wander in lead(s) V1 Confirmed by Orpah Greek [phone]) on 07/03/2017 3:02:36 AM         Radiology Dg Chest 2 View  Result Date: 07/03/2017 CLINICAL DATA:  Rib injury EXAM: CHEST  2 VIEW COMPARISON:  02/02/17 FINDINGS: The heart size and mediastinal contours are within normal limits. Both lungs are clear. The visualized skeletal structures are unremarkable. IMPRESSION: No active cardiopulmonary disease. Electronically Signed   By: Ulyses Jarred M.D.   On: 07/03/2017 00:39   Ct Head Wo Contrast  Result Date: 07/03/2017 CLINICAL DATA:  Fall, on blood thinners.  Question syncope. EXAM: CT HEAD WITHOUT CONTRAST TECHNIQUE: Contiguous axial images were obtained from the base of the skull through the vertex without intravenous contrast. COMPARISON:  Brain MRI 01/01/2017 FINDINGS: Brain: Unchanged atrophy and chronic small vessel ischemia. No intracranial hemorrhage,  mass effect, or midline shift. No hydrocephalus. The basilar cisterns are patent. No evidence of territorial infarct or acute ischemia. Scattered gyral calcifications. No extra-axial or intracranial fluid collection. Vascular: Advanced atherosclerosis of skull base vasculature. No hyperdense vessel. Skull: No fracture or focal lesion. Sinuses/Orbits: Frontal sinuses and left mastoid air cells slightly hypoplastic. No acute finding. Intramuscular calcifications about the right infratemporal fossa. Other: None. IMPRESSION: 1.  No acute intracranial abnormality.  No skull fracture. 2. Unchanged atrophy and chronic small vessel ischemia from prior  MRI. Electronically Signed   By: Jeb Levering M.D.   On: 07/03/2017 02:23   Ct Abdomen Pelvis W Contrast  Result Date: 07/03/2017 CLINICAL DATA:  Fall with rib pain. EXAM: CT ABDOMEN AND PELVIS WITH CONTRAST TECHNIQUE: Multidetector CT imaging of the abdomen and pelvis was performed using the standard protocol following bolus administration of intravenous contrast. CONTRAST:  100 mL Isovue-300 COMPARISON:  09/13/2006 FINDINGS: Lower chest: No basilar pulmonary nodules or pleural effusion. No apical pericardial effusion. Hepatobiliary: Normal hepatic contours and density. No visible biliary dilatation. Normal gallbladder. Pancreas: Normal parenchymal contours without ductal dilatation. No peripancreatic fluid collection. Spleen: Normal. Adrenals/Urinary Tract: --Adrenal glands: Normal. --Right kidney/ureter: 5 mm lower pole nonobstructive calculus. Lower pole renal cyst measures 2.3 cm. --Left kidney/ureter: No hydronephrosis, perinephric stranding or nephrolithiasis. No obstructing ureteral stones. --Urinary bladder: Normal appearance for the degree of distention. Stomach/Bowel: --Stomach/Duodenum: No hiatal hernia or other gastric abnormality. Normal duodenal course. --Small bowel: No dilatation or inflammation. --Colon: No focal abnormality. --Appendix: Normal.  Vascular/Lymphatic: Juxtarenal abdominal aortic aneurysm measures 3.8 cm. Extensive atherosclerotic calcification of the abdominal aorta and its branch vessels. No abdominal or pelvic lymphadenopathy. Reproductive: No free fluid in the pelvis. Musculoskeletal. Grade 1 retrolisthesis at L2-L3 and L3-L4 with grade 1 anterolisthesis at L4-L5. No acute fracture. Other: None. IMPRESSION: 1. No acute abnormality. 2. Juxtarenal abdominal aortic aneurysm measuring 3.8 cm, new compared to 09/13/2006. Recommend followup by ultrasound in 2 years. This recommendation follows ACR consensus guidelines: White Paper of the ACR Incidental Findings Committee II on Vascular Findings. J Am Coll Radiol 2013; 10:789-794. 3. Nonobstructive right lower pole nephrolithiasis. Electronically Signed   By: Ulyses Jarred M.D.   On: 07/03/2017 02:33   Dg Hip Unilat W Or Wo Pelvis 2-3 Views Left  Result Date: 07/03/2017 CLINICAL DATA:  Fall with bruising to the left hip EXAM: DG HIP (WITH OR WITHOUT PELVIS) 2-3V LEFT COMPARISON:  None. FINDINGS: Vascular calcifications. Pubic symphysis and rami are intact. No acute displaced fracture or malalignment is seen. Minimal degenerative change. IMPRESSION: No acute osseous abnormality Electronically Signed   By: Donavan Foil M.D.   On: 07/03/2017 01:58    Procedures Procedures (including critical care time)  Medications Ordered in ED Medications  sodium chloride 0.9 % injection (not administered)  cefTRIAXone (ROCEPHIN) 1 g in sodium chloride 0.9 % 100 mL IVPB (not administered)  fentaNYL (SUBLIMAZE) injection 50 mcg (50 mcg Intravenous Given 07/03/17 0304)  iopamidol (ISOVUE-300) 61 % injection 100 mL (100 mLs Intravenous Contrast Given 07/03/17 0216)  sodium chloride 0.9 % bolus 500 mL (500 mLs Intravenous New Bag/Given 07/03/17 0351)     Initial Impression / Assessment and Plan / ED Course  I have reviewed the triage vital signs and the nursing notes.  Pertinent labs & imaging results  that were available during my care of the patient were reviewed by me and considered in my medical decision making (see chart for details).  Clinical Course as of Jul 04 422  Mon Jul 03, 2017  0356 Pt feels very lightheaded with standing and requires assistance with ambulation due to feeling lightheaded.    [HM]  0423 Pt also with evidence of urinary tract infection and some dehydration.  Will give Rocephin.  [HM]  1607 Discussed with Dr. Tamala Julian who will admit  [HM]    Clinical Course User Index [HM] Alahni Varone, Gwenlyn Perking   Presents with several falls today and feelings of lightheadedness.  EKG shows rate controlled A. fib.  Patient  reports no history of the same.  Patient with soft blood pressures on arrival.  Increase in heart rate with standing.  Patient feels very "woozy" and lightheaded upon standing.  Left flank contusion after fall today.  CT scan without signs of retroperitoneal hemorrhage.  Known [name] again seen on CT; she is being followed by vascular.  CT head without acute abnormality and no evidence of intracranial hemorrhage. Patient will need admission for A. fib workup.  Judicious fluid bolus given.  Discussed with patient and son who are in agreement with the plan.  The patient was discussed with and seen by Dr. Betsey Holiday who agrees with the treatment plan.   Orthostatic VS for the past 24 hrs:  BP- Lying Pulse- Lying BP- Sitting Pulse- Sitting BP- Standing at 0 minutes Pulse- Standing at 0 minutes  07/03/17 0308 106/75 88 100/78 93 101/74 100     Final Clinical Impressions(s) / ED Diagnoses   Final diagnoses:  Atrial fibrillation, unspecified type ([place])  Fall in home, initial encounter  Contusion of left back wall of thorax, initial encounter  Lightheadedness  Hypotension, unspecified hypotension type  Urinary tract infection without hematuria, site unspecified    ED Discharge Orders    None       Zaydn Gutridge, Gwenlyn Perking 07/03/17 8786    Josanna Hefel,  Jarrett Soho, PA-C 07/03/17 0424    Orpah Greek, MD 07/03/17 [phone]

## 2017-07-03 NOTE — ED Notes (Addendum)
ADMISSION MD HERE. PT DOES NOT NEED SNIFF. PT CHANGED TO OBSERVATION AND MED-SURG. ADMISSION MD SPOKE WITH ERIN DSW

## 2017-07-03 NOTE — Progress Notes (Addendum)
Patient arrived to the unit after 1845. Vitals taken, telemetry applied and patient cleaned and made comfortable, in bed resting, reported off to night shift nurse to F/U with plan of care.

## 2017-07-03 NOTE — ED Notes (Signed)
Patient transported to MRI 

## 2017-07-03 NOTE — Progress Notes (Signed)
  Echocardiogram 2D Echocardiogram has been performed.  Merrie Roof F 07/03/2017, 2:41 PM

## 2017-07-03 NOTE — ED Provider Notes (Addendum)
Patient presented to the ER with left-sided pain after a fall.  Patient reports that she does not know what caused her fall.  She is complaining of pain in the left ribs.  No loss of consciousness.  Face to face Exam: HEENT - PERRLA Lungs - CTAB, left chest tender to touch without crepitance Heart - irregularly irregular, no M/R/G Abd - S/NT/ND Neuro - alert, oriented x3  Plan: Patient is on Plavix, workup for occult bleeding and fractures from her fall.  EKG shows atrial fibrillation (rate controlled as she is on atenolol).  No anticoagulation because of recent falls.  She is, however, hypotensive and moderately orthostatic.  Will require hospitalization for further treatment.   Orpah Greek, MD 07/03/17 0131    Orpah Greek, MD 07/03/17 4388    Orpah Greek, MD 07/03/17 [phone]

## 2017-07-04 LAB — GLUCOSE, CAPILLARY: Glucose-Capillary: 140 mg/dL — ABNORMAL HIGH (ref 65–99)

## 2017-07-04 MED ORDER — IPRATROPIUM BROMIDE 0.02 % IN SOLN: 0.5000 mg | Freq: Four times a day (QID) | RESPIRATORY_TRACT | Status: DC | PRN

## 2017-07-04 MED ORDER — PREDNISONE 50 MG PO TABS
50.0000 mg | ORAL_TABLET | Freq: Every day | ORAL | Status: DC
  Administered 2017-07-05: 50 mg via ORAL
  Filled 2017-07-04 (×2): qty 1

## 2017-07-04 MED ORDER — METHYLPREDNISOLONE SODIUM SUCC 40 MG IJ SOLR
40.0000 mg | Freq: Two times a day (BID) | INTRAMUSCULAR | Status: AC
  Administered 2017-07-04 (×2): 40 mg via INTRAVENOUS
  Filled 2017-07-04 (×2): qty 1

## 2017-07-04 MED ORDER — LEVALBUTEROL HCL 0.63 MG/3ML IN NEBU
0.6300 mg | INHALATION_SOLUTION | Freq: Four times a day (QID) | RESPIRATORY_TRACT | Status: DC
  Administered 2017-07-04: 0.63 mg via RESPIRATORY_TRACT
  Filled 2017-07-04: qty 3

## 2017-07-04 MED ORDER — LEVALBUTEROL HCL 0.63 MG/3ML IN NEBU: 0.6300 mg | INHALATION_SOLUTION | Freq: Four times a day (QID) | RESPIRATORY_TRACT | Status: DC | PRN

## 2017-07-04 MED ORDER — IPRATROPIUM BROMIDE 0.02 % IN SOLN
0.5000 mg | Freq: Four times a day (QID) | RESPIRATORY_TRACT | Status: DC
  Administered 2017-07-04: 0.5 mg via RESPIRATORY_TRACT
  Filled 2017-07-04: qty 2.5

## 2017-07-04 NOTE — Plan of Care (Signed)
  Progressing Education: Knowledge of General Education information will improve 07/04/2017 2250 - Progressing by Talbert Forest, RN Clinical Measurements: Ability to maintain clinical measurements within normal limits will improve 07/04/2017 2250 - Progressing by Talbert Forest, RN Elimination: Will not experience complications related to bowel motility 07/04/2017 2250 - Progressing by Talbert Forest, RN Will not experience complications related to urinary retention 07/04/2017 2250 - Progressing by Talbert Forest, RN Pain Managment: General experience of comfort will improve 07/04/2017 2250 - Progressing by Talbert Forest, RN Safety: Ability to remain free from injury will improve 07/04/2017 2250 - Progressing by Talbert Forest, RN Skin Integrity: Risk for impaired skin integrity will decrease 07/04/2017 2250 - Progressing by Talbert Forest, RN

## 2017-07-04 NOTE — Progress Notes (Signed)
[facility] Progress Note  Patient: [name]:[id number]   PCP: [name], MD DOB: 1933-08-31   DOA: 07/03/2017   DOS: 07/04/2017   Date of Service: the patient was seen and examined on 07/04/2017  Subjective: Patient is feeling better, pain is still there but better.  No nausea no vomiting.  Oral intake is improved.  Brief hospital course: Pt. with PMH of HTN, [name], chronic pain, [name] ,prediabetes, arthritis, [name]; admitted on 07/03/2017, presented with complaint of fall, and left flank pain, was found to have UTI, right renal stone, musculoskeletal left flank pain, rate control A. fib. Currently further plan is to continue IV antibiotics.  Assessment and Plan: Fall with left chest contusion: Acute.  Patient reports not knowing the cause of her fall. - No focal deficit, no significant drop in orthostasis.  CT scan as well as MRI brain negative for any acute stroke. PT recommends home health.  Urinary tract infection: Acute.  Patient found to have urinalysis with positive nitrites, small leukocytes, rare bacteria, and 6-30 WBCs.  Patient was started on Rocephin IV E. coli growing in the urine.  Sensitivities pending.  Atrial fibrillation: Patient presents in atrial fibrillation appears rate controlled.  CHADsVASc score equals at least 6. Appears to be somewhat of a high falls risk.  Has-bled score equals at least 3.  Patient seen previously go into atrial fibrillation transiently as a result of infection during her 01/2017 admission. - Normal on TSH - Echo cardiogram shows preserved EF, no significant valvular abnormality. -Recommend to discuss with PCP regarding anticoagulation as an outpatient.  Essential hypertension - Continue atenolol - Held hydralazine restart when medically appropriate  Chronic pain - Continue Cymbalta and morphine substituted for Opana, but did not schedule dosing.  History of [name] - Continue Plavix   Hyperlipidemia - Continue  Crestor  OSA: not on CPAP  [name]: Stable.  Incidentally noted on CT scan of the chest measuring 3.8 cm.  Previously noted to be 3.6 cm in 2015. - Follow-up repeat ultrasound recommended in 2 years   Nephrolithiasis: Incidental finding on CT of the abdomen noting right nephrolithiasis without obstruction.  Diet: Cardiac diet DVT Prophylaxis: subcutaneous Heparin  Advance goals of care discussion: full code  Family Communication: family was present at bedside, at the time of interview. The pt provided permission to discuss medical plan with the family. Opportunity was given to ask question and all questions were answered satisfactorily.   Disposition:  Discharge to home.  Consultants: none Procedures: Echocardiogram   Antibiotics: Anti-infectives (From admission, onward)   Start     Dose/Rate Route Frequency Ordered Stop   07/04/17 0600  cefTRIAXone (ROCEPHIN) 1 g in sodium chloride 0.9 % 100 mL IVPB     1 g 200 mL/hr over 30 Minutes Intravenous Every 24 hours 07/03/17 0523     07/03/17 1300  azithromycin (ZITHROMAX) tablet 500 mg     500 mg Oral Daily 07/03/17 1229     07/03/17 0430  cefTRIAXone (ROCEPHIN) 1 g in sodium chloride 0.9 % 100 mL IVPB     1 g 200 mL/hr over 30 Minutes Intravenous  Once 07/03/17 0423 07/03/17 0526       Objective: Physical Exam: Vitals:   07/04/17 0501 07/04/17 1422 07/04/17 1425 07/04/17 1500  BP: 116/74   136/89  Pulse: 91   (!) 110  Resp: 20   16  Temp: 99.4 F (37.4 C)   98.4 F (36.9 C)  TempSrc: Oral  Oral  SpO2: 97% 94% 94% 94%  Weight:      Height:        Intake/Output Summary (Last 24 hours) at 07/04/2017 1917 Last data filed at 07/04/2017 0519 Gross per 24 hour  Intake 220 ml  Output -  Net 220 ml   Filed Weights   07/02/17 2353 07/03/17 1853  Weight: 75.8 kg (167 lb) 76.7 kg (169 lb 3.2 oz)   General: Alert, Awake and Oriented to Time, Place and Person. Appear in mild distress, affect appropriate Eyes: PERRL,  Conjunctiva normal ENT: Oral Mucosa clear moist. Neck: no JVD, no Abnormal Mass Or lumps Cardiovascular: S1 and S2 Present, no Murmur, Peripheral Pulses Present Respiratory: normal respiratory effort, Bilateral Air entry equal and Decreased, no use of accessory muscle, Clear to Auscultation, no Crackles, no wheezes Abdomen: Bowel Sound present, Soft and no tenderness, no hernia Skin: no redness, no Rash, no induration Extremities: no Pedal edema, no calf tenderness Neurologic: Grossly no focal neuro deficit. Bilaterally Equal motor strength  Data Reviewed: CBC: Recent Labs  Lab 07/03/17 0033  WBC 8.9  HGB 13.0  HCT 41.3  MCV 94.7  PLT 366   Basic Metabolic Panel: Recent Labs  Lab 07/03/17 0033  NA 143  K 3.6  CL 101  CO2 31  GLUCOSE 141*  BUN 17  CREATININE 0.90  CALCIUM 9.1    Liver Function Tests: No results for input(s): AST, ALT, ALKPHOS, BILITOT, PROT, ALBUMIN in the last 168 hours. No results for input(s): LIPASE, AMYLASE in the last 168 hours. No results for input(s): AMMONIA in the last 168 hours. Coagulation Profile: No results for input(s): INR, PROTIME in the last 168 hours. Cardiac Enzymes: No results for input(s): CKTOTAL, CKMB, CKMBINDEX, TROPONINI in the last 168 hours. BNP (last 3 results) No results for input(s): PROBNP in the last 8760 hours. CBG: No results for input(s): GLUCAP in the last 168 hours. Studies: No results found.  Scheduled Meds: . atenolol  25 mg Oral Daily  . azithromycin  500 mg Oral Daily  . clopidogrel  75 mg Oral Daily  . dextromethorphan-guaiFENesin  1 tablet Oral BID  . DULoxetine  30 mg Oral TID  . enoxaparin (LOVENOX) injection  40 mg Subcutaneous Q24H  . gabapentin  1,600 mg Oral QHS  . gabapentin  800 mg Oral BID WC  . methylPREDNISolone (SOLU-MEDROL) injection  40 mg Intravenous Q12H  . [START ON 07/05/2017] predniSONE  50 mg Oral Q breakfast  . rosuvastatin  40 mg Oral Daily   Continuous Infusions: .  cefTRIAXone (ROCEPHIN) IVPB 1 gram/100 mL NS (Mini-Bag Plus) Stopped (07/04/17 0539)   PRN Meds: acetaminophen **OR** acetaminophen, HYDROcodone-acetaminophen, ipratropium, levalbuterol, methocarbamol, morphine, ondansetron **OR** ondansetron (ZOFRAN) IV  Time spent: 35 minutes  Author: Berle Mull, MD [facility] Pager: [phone] 07/04/2017 7:17 PM  If 7PM-7AM, please contact night-coverage at [email or web address], password [email or web address]

## 2017-07-04 NOTE — Evaluation (Signed)
Occupational Therapy Evaluation Patient Details Name: [name] MRN: [id number] DOB: 25-Apr-1934 Today's Date: 07/04/2017    History of Present Illness 84 y.o. female with medical history significant of HTN, [name], chronic pain, [name] ,prediabetes, arthritis, [name]; who presents after having a fall at home while in the bathroom    Clinical Impression   Pt admitted with fall. Pt currently with functional limitations due to the deficits listed below (see OT Problem List).  Pt will benefit from skilled OT to increase their safety and independence with ADL and functional mobility for ADL to facilitate discharge to venue listed below.      Follow Up Recommendations  No OT follow up;Supervision/Assistance - 24 hour    Equipment Recommendations  None recommended by OT    Recommendations for Other Services       Precautions / Restrictions Precautions Precautions: Fall Precaution Comments: L rib pain from fall prior to arrival      Mobility Bed Mobility Overal bed mobility: Needs Assistance Bed Mobility: Sit to Supine       Sit to supine: Supervision      Transfers Overall transfer level: Needs assistance Equipment used: Rolling walker (2 wheeled) Transfers: Sit to/from [facility] Sit to Stand: Min assist Stand pivot transfers: Min assist       General transfer comment: BSC    Balance                                           ADL either performed or assessed with clinical judgement   ADL Overall ADL's : Needs assistance/impaired                                       General ADL Comments: Pt overall min A with ADL activity including toileting as well as LB dressing this OT visit     Vision Baseline Vision/History: Wears glasses Wears Glasses: Reading only Patient Visual Report: No change from baseline              Pertinent Vitals/Pain Pain Assessment: 0-10 Pain Score: 3  Pain Location: L rib area Pain  Descriptors / Indicators: Sore Pain Intervention(s): Limited activity within patient's tolerance;Repositioned;Monitored during session     Hand Dominance     Extremity/Trunk Assessment Upper Extremity Assessment Upper Extremity Assessment: Generalized weakness   Lower Extremity Assessment Lower Extremity Assessment: Generalized weakness       Communication Communication Communication: No difficulties(Simultaneous filing. User may not have seen previous data.)   Cognition Arousal/Alertness: Awake/alert Behavior During Therapy: Flat affect Overall Cognitive Status: Within Functional Limits for tasks assessed                                                Home Living Family/patient expects to be discharged to:: Private residence(Simultaneous filing. User may not have seen previous data.) Living Arrangements: Children(2 sons, one works, one is home all day Simultaneous filing. User may not have seen previous data.) Available Help at Discharge: Family(Simultaneous filing. User may not have seen previous data.) Type of Home: House(Simultaneous filing. User may not have seen previous data.) Home Access: Level entry     Home Layout:  Able to live on main level with bedroom/bathroom(Simultaneous filing. User may not have seen previous data.)               Home Equipment: Walker - 4 wheels(Simultaneous filing. User may not have seen previous data.)          Prior Functioning/Environment Level of Independence: Needs assistance(Simultaneous filing. User may not have seen previous data.)  Gait / Transfers Assistance Needed: uses rollator ADL's / Homemaking Assistance Needed: son cooks(Simultaneous filing. User may not have seen previous data.)   Comments: family A her with ADL activity (Simultaneous filing. User may not have seen previous data.)        OT Problem List: Decreased strength;Decreased activity tolerance;Impaired balance (sitting and/or  standing);Decreased knowledge of use of DME or AE;Decreased safety awareness      OT Treatment/Interventions: Self-care/ADL training;Patient/family education;DME and/or AE instruction    OT Goals(Current goals can be found in the care plan section) Acute Rehab OT Goals Patient Stated Goal: home wiht sons OT Goal Formulation: With patient Time For Goal Achievement: 07/11/17  OT Frequency: Min 2X/week   Barriers to D/C:               AM-PAC PT "6 Clicks" Daily Activity     Outcome Measure Help from another person eating meals?: None Help from another person taking care of personal grooming?: A Little Help from another person toileting, which includes using toliet, bedpan, or urinal?: A Little Help from another person bathing (including washing, rinsing, drying)?: A Little Help from another person to put on and taking off regular upper body clothing?: None Help from another person to put on and taking off regular lower body clothing?: A Little 6 Click Score: 20   End of Session Equipment Utilized During Treatment: Rolling walker Nurse Communication: Mobility status  Activity Tolerance: Patient tolerated treatment well Patient left: in bed;with nursing/sitter in room  OT Visit Diagnosis: Unsteadiness on feet (R26.81);History of falling (Z91.81);Muscle weakness (generalized) (M62.81)                Time: 1250-1310 OT Time Calculation (min): 20 min Charges:    G-Codes:     Kari Baars, [place]  Payton Mccallum D 07/04/2017, 1:28 PM

## 2017-07-04 NOTE — Evaluation (Signed)
Physical Therapy Evaluation Patient Details Name: [name] MRN: [id number] DOB: 02/03/1934 Today's Date: 07/04/2017   History of Present Illness  84 y.o. female with medical history significant of HTN, [name], chronic pain, [name] ,prediabetes, arthritis, [name]; who presents after having a fall at home while in the bathroom   Clinical Impression  Pt admitted with above diagnosis. Pt currently with functional limitations due to the deficits listed below (see PT Problem List).  Pt will benefit from skilled PT to increase their independence and safety with mobility to allow discharge to the venue listed below.  Pt reports fall in her bathroom after standing and lifting her LE garments, LOB backwards per pt.  Pt states she lives with 2 sons and they can assist as needed upon d/c.  Pt typically uses rollator for mobility.  Recommend HHPT upon d/c.     Follow Up Recommendations Home health PT;Supervision for mobility/OOB    Equipment Recommendations  None recommended by PT    Recommendations for Other Services       Precautions / Restrictions Precautions Precautions: Fall Precaution Comments: L rib pain from fall prior to arrival      Mobility  Bed Mobility Overal bed mobility: Needs Assistance Bed Mobility: Supine to Sit     Supine to sit: Min guard Sit to supine: Supervision   General bed mobility comments: provided a hand for pt to self assist, slow and effortful  Transfers Overall transfer level: Needs assistance Equipment used: Rolling walker (2 wheeled) Transfers: Sit to/from Stand Sit to Stand: Min assist Stand pivot transfers: Min assist       General transfer comment: verbal cues for safe technique, pt reports mild dizziness, HR 103 bpm  Ambulation/Gait Ambulation/Gait assistance: Min guard Ambulation Distance (Feet): 30 Feet Assistive device: Rolling walker (2 wheeled) Gait Pattern/deviations: Step-through pattern;Decreased stride length     General Gait  Details: steady with RW however pt reporting dizziness so kept distance short, BP 140/95 mmHg and HR 103 bpm upon sitting in recliner  Stairs            Wheelchair Mobility    Modified Rankin (Stroke Patients Only)       Balance Overall balance assessment: History of Falls                                           Pertinent Vitals/Pain Pain Assessment: 0-10 Pain Score: 3  Pain Location: L rib area Pain Descriptors / Indicators: Sore Pain Intervention(s): Limited activity within patient's tolerance;Repositioned;Monitored during session    Home Living Family/patient expects to be discharged to:: Private residence(Simultaneous filing. User may not have seen previous data.) Living Arrangements: Children(2 sons, one works, one is home all day Simultaneous filing. User may not have seen previous data.) Available Help at Discharge: Family(Simultaneous filing. User may not have seen previous data.) Type of Home: House(Simultaneous filing. User may not have seen previous data.) Home Access: Level entry     Home Layout: Able to live on main level with bedroom/bathroom(Simultaneous filing. User may not have seen previous data.) Home Equipment: Walker - 4 wheels(Simultaneous filing. User may not have seen previous data.)      Prior Function Level of Independence: Needs assistance(Simultaneous filing. User may not have seen previous data.)   Gait / Transfers Assistance Needed: uses rollator  ADL's / Homemaking Assistance Needed: son cooks(Simultaneous filing. User may not have  seen previous data.)  Comments: family A her with ADL activity (Simultaneous filing. User may not have seen previous data.)     Hand Dominance        Extremity/Trunk Assessment   Upper Extremity Assessment Upper Extremity Assessment: Generalized weakness    Lower Extremity Assessment Lower Extremity Assessment: Generalized weakness       Communication   Communication: No  difficulties(Simultaneous filing. User may not have seen previous data.)  Cognition Arousal/Alertness: Awake/alert Behavior During Therapy: Flat affect Overall Cognitive Status: Within Functional Limits for tasks assessed                                        General Comments      Exercises     Assessment/Plan    PT Assessment Patient needs continued PT services  PT Problem List Decreased strength;Decreased mobility;Decreased activity tolerance;Decreased balance;Decreased knowledge of use of DME       PT Treatment Interventions DME instruction;Therapeutic activities;Therapeutic exercise;Gait training;Patient/family education;Functional mobility training;Balance training    PT Goals (Current goals can be found in the Care Plan section)  Acute Rehab PT Goals Patient Stated Goal: home wiht sons PT Goal Formulation: With patient Time For Goal Achievement: 07/11/17 Potential to Achieve Goals: Good    Frequency Min 3X/week   Barriers to discharge        Co-evaluation               AM-PAC PT "6 Clicks" Daily Activity  Outcome Measure Difficulty turning over in bed (including adjusting bedclothes, sheets and blankets)?: A Little Difficulty moving from lying on back to sitting on the side of the bed? : A Lot Difficulty sitting down on and standing up from a chair with arms (e.g., wheelchair, bedside commode, etc,.)?: Unable Help needed moving to and from a bed to chair (including a wheelchair)?: A Little Help needed walking in hospital room?: A Little Help needed climbing 3-5 steps with a railing? : A Lot 6 Click Score: 14    End of Session   Activity Tolerance: Other (comment)(limited by dizziness) Patient left: in chair;with call bell/phone within reach(aware to call for assist) Nurse Communication: Mobility status PT Visit Diagnosis: Other abnormalities of gait and mobility (R26.89)    Time: 2542-7062 PT Time Calculation (min) (ACUTE ONLY): 13  min   Charges:   PT Evaluation $PT Eval Low Complexity: 1 Low     PT G CodesCarmelia Bake, PT, DPT 07/04/2017 Pager: [phone]   York Ram E 07/04/2017, 1:51 PM

## 2017-07-05 LAB — CBC
HCT: 42.6 % (ref 36.0–46.0)
Hemoglobin: 14 g/dL (ref 12.0–15.0)
MCH: 29.9 pg (ref 26.0–34.0)
MCHC: 32.9 g/dL (ref 30.0–36.0)
MCV: 90.8 fL (ref 78.0–100.0)
Platelets: 234 10*3/uL (ref 150–400)
RBC: 4.69 MIL/uL (ref 3.87–5.11)
RDW: 14.2 % (ref 11.5–15.5)
WBC: 8.9 10*3/uL (ref 4.0–10.5)

## 2017-07-05 LAB — BASIC METABOLIC PANEL
Anion gap: 14 (ref 5–15)
BUN: 20 mg/dL (ref 6–20)
CO2: 25 mmol/L (ref 22–32)
Calcium: 9.3 mg/dL (ref 8.9–10.3)
Chloride: 99 mmol/L — ABNORMAL LOW (ref 101–111)
Creatinine, Ser: 0.85 mg/dL (ref 0.44–1.00)
GFR calc Af Amer: >60 mL/min (ref >60)
GFR calc non Af Amer: >60 mL/min (ref >60)
Glucose, Bld: 150 mg/dL — ABNORMAL HIGH (ref 65–99)
Potassium: 3.8 mmol/L (ref 3.5–5.1)
Sodium: 138 mmol/L (ref 135–145)

## 2017-07-05 LAB — MAGNESIUM: Magnesium: 1.7 mg/dL (ref 1.7–2.4)

## 2017-07-05 LAB — URINE CULTURE: Culture: >=100000 — AB

## 2017-07-05 MED ORDER — CEPHALEXIN 500 MG PO CAPS: 500.0000 mg | ORAL_CAPSULE | Freq: Two times a day (BID) | ORAL | 0 refills | Status: AC

## 2017-07-05 MED ORDER — PREDNISONE 10 MG PO TABS: ORAL_TABLET | ORAL | 0 refills | Status: DC

## 2017-07-05 MED ORDER — CEPHALEXIN 500 MG PO CAPS: 500.0000 mg | ORAL_CAPSULE | Freq: Two times a day (BID) | ORAL | Status: DC

## 2017-07-05 NOTE — Discharge Summary (Signed)
Discharge Summary  [name] FGH:[id number] DOB: 09-30-1933  PCP: [name], MD  Admit date: 07/03/2017 Discharge date: 07/05/2017  Time spent: >60mins, more than 50% time spent on coordination of care.  Recommendations for Outpatient Follow-up:  1. F/u with PMD within a week  for hospital discharge follow up, repeat cbc/bmp at follow up 2. Home health arranged  Discharge Diagnoses:  Active Hospital Problems   Diagnosis Date Noted  . Fall at home, initial encounter 07/03/2017  . UTI (urinary tract infection) 07/03/2017  . A-fib ([place]) 07/03/2017  . Chronic pain syndrome 02/02/2017  . [name] (abdominal aortic aneurysm) ([place]) 09/30/2013  . Essential hypertension 03/11/2006    Resolved Hospital Problems  No resolved problems to display.    Discharge Condition: stable  Diet recommendation: heart healthy  Filed Weights   07/02/17 2353 07/03/17 1853  Weight: 75.8 kg (167 lb) 76.7 kg (169 lb 3.2 oz)    History of present illness: (per admitting MD Dr Tamala Julian) PCP: [name], MD  Patient coming from: Home  Chief Complaint: Fall  I have personally briefly reviewed patient's old medical records in [place]   HPI: [name] is a 84 y.o. female with medical history significant of HTN, [name], chronic pain, [name] ,prediabetes, arthritis, [name]; [name]  Patient reports not knowing what caused her to fall.  Denies any loss of consciousness, lightheadedness, chest pain, palpitations, or trauma to her head.  She did strike the left side of her chest and complains of some tenderness there.  Over the last day or so she had some generalized weakness. She had gone to her primary care provider for a cough and had been prescribed and completed a course of azithromycin just a few days ago.  Denies having any fever, chills, dysuria, abdominal pain, nausea, vomiting, diarrhea, or p.o. intake.   Her son who lives at home with her heard her fall but did not witness it and came upstairs and helped her up.  Patient makes note that she has heard that she has had any irregular heart rhythm during her previous hospitalization in October.  She reports being on Plavix for history of TIAs.  ED Course: Upon admission into the emergency department patient was noted to be afebrile with relatively normal vital signs and negative orthostatics vital signs.  Labs are unremarkable.  X-rays of her hip and chest were negative for any acute abnormalities.  CT scan brain showed no acute abnormalities.  CT of the abdomen showed a 3.8 cm abdominal aortic aneurysm.  Patient noted to be in atrial fibrillation on EKG.     Hospital Course:  Principal Problem:   Fall at home, initial encounter Active Problems:   Essential hypertension   [name] (abdominal aortic aneurysm) (HCC)   Chronic pain syndrome   UTI (urinary tract infection)   A-fib ([place])   Fall at home: -with left sided rib Pain, cxr no acute findings. -Acute. Patient reports not knowing the cause of her fall. -No focal deficit, no significant drop in orthostasis.  CT head as well as MRI brain negative for any acute stroke. -tele did show afib but rate controlled, echo lvef wnl. Troponin negative -PT recommends home health.  Urinary tract infection:Acute. Patient found to have urinalysis with positive nitrites, small leukocytes, rare bacteria, and 6-30 WBCs.  Pan sensitive E. coli growing in the urine.  blood culture no growth.  Patient was started on Rocephin IV on admission, she is discharged on keflex  Nephrolithiasis:Incidental finding on CT of the abdomen noting right nephrolithiasis without obstruction. With h/o recurrent uti, she is advised to follow up with urology on outpatient basis.  Bronchitis: Patient was seen on 2/28 for cough at pcp office. On presentation, she has wheezing, cxr no acute findings, she is already on abx, she  is treated with nebs and steroids, wheezing and cough resolved at discharge. She is discharged on abx and steroids taper.   Atrial fibrillation:Patient presents in atrial fibrillation appears rate controlled. CHADsVASc scoreequalsat least 6.Appears to be somewhat of a high falls risk. Has-bled score equals at least 3.she does has risk of falls.Patient seen previously go into atrial fibrillation transiently as a result of infection during her 01/2017 admission. -Normal on TSH -Echo cardiogram shows preserved EF, no significant valvular abnormality. -rate controlled on atenolol -Recommend to discuss with PCP/cardiology regarding anticoagulation as an outpatient.  Essential hypertension -home med hctz held on admission, resume at discharge, Continue atenolol   Chronic pain -Continue home medsCymbaltaand Opana  History of [name] -Continue Plavix /statin/atenolol   Hyperlipidemia -Continue Crestor  OSA:not on CPAP  CZY:SAYTKZ. Incidentally noted on CT scan of the chest measuring 3.8 cm. Previously noted to be 3.6 cmin 2015. -Follow-up repeat ultrasound recommended in 2 years     Diet: Cardiac diet DVT Prophylaxis while in the hospital: subcutaneous Heparin  Advance goals of care discussion: full code  Family Communication: patient   Disposition:  Discharge to home with home health  Consultants: none Procedures: Echocardiogram    Discharge Exam: BP 140/86 (BP Location: Left Arm)   Pulse 82   Temp (!) 97.3 F (36.3 C) (Oral)   Resp 18   Ht 5\' 4"  (1.626 m)   Wt 76.7 kg (169 lb 3.2 oz)   SpO2 95%   BMI 29.04 kg/m   General: NAD, aaox3 Cardiovascular: IRRR Respiratory: CTABL  Discharge Instructions You were cared for by a hospitalist during your hospital stay. If you have any questions about your discharge medications or the care you received while you were in the hospital after you are discharged, you can call the unit and asked to  speak with the hospitalist on call if the hospitalist that took care of you is not available. Once you are discharged, your primary care physician will handle any further medical issues. Please note that NO REFILLS for any discharge medications will be authorized once you are discharged, as it is imperative that you return to your primary care physician (or establish a relationship with a primary care physician if you do not have one) for your aftercare needs so that they can reassess your need for medications and monitor your lab values.  Discharge Instructions    Diet - low sodium heart healthy   Complete by:  As directed    Increase activity slowly   Complete by:  As directed      Allergies as of 07/05/2017      Reactions   Sulfonamide Derivatives Hives, Rash      Medication List    STOP taking these medications   azithromycin 250 MG tablet Commonly known as:  ZITHROMAX     TAKE these medications   atenolol 25 MG tablet Commonly known as:  TENORMIN TAKE 1 TABLET(25 MG) BY MOUTH DAILY   cephALEXin 500 MG capsule Commonly known as:  KEFLEX Take 1 capsule (500 mg total) by mouth every 12 (twelve) hours for  3 days.   clopidogrel 75 MG tablet Commonly known as:  PLAVIX Take 1 tablet (75 mg total) by mouth daily.   DULoxetine 30 MG capsule Commonly known as:  CYMBALTA Take 30 mg by mouth 3 (three) times daily.   gabapentin 800 MG tablet Commonly known as:  NEURONTIN TAKE 1 TABLET BY MOUTH EVERY MORNING AND AT LUNCH, 2 TABLETS BY MOUTH EVERY NIGHT AT BEDTIME   hydrochlorothiazide 25 MG tablet Commonly known as:  HYDRODIURIL Take 1 tablet (25 mg total) by mouth daily.   oxymorphone 5 MG tablet Commonly known as:  OPANA Take 5 mg by mouth 3 (three) times daily.   predniSONE 10 MG tablet Commonly known as:  DELTASONE Label  & dispense according to the schedule below. 4 Pills PO on day one then, 3 Pills PO on day two, 2 Pills PO on day three, 1 Pills PO on day four,  then  STOP.   rosuvastatin 40 MG tablet Commonly known as:  CRESTOR Take 1 tablet (40 mg total) by mouth daily.      Allergies  Allergen Reactions  . Sulfonamide Derivatives Hives and Rash   Follow-up Information    [name], MD Follow up in 1 week(s).   Specialty:  Family Medicine Why:  hospital discharge follow up.  pmd to arrange abdominal US in two years to follow up on Juxtarenal abdominal aortic aneurysm. pmd to refer patient to cardioloyg for Afib Contact information: [address] [place] [address] [phone]        [facility] Follow up in 3 week(s).   Why:  Nonobstructive right lower pole nephrolithiasis with h/o recurrent UTI Contact information: [facility] Follow up.   Specialty:  [place] Why:  [facility] physical therapy Contact information: [address] [place] [address] [phone]        [facility] Follow up in 3 week(s).   Specialty:  Cardiology Why:  afib Contact information: [address], [place] [phone]           The results of significant diagnostics from this hospitalization (including imaging, microbiology, ancillary and laboratory) are listed below for reference.    Significant Diagnostic Studies: Dg Chest 2 View  Result Date: 07/03/2017 CLINICAL DATA:  Rib injury EXAM: CHEST  2 VIEW COMPARISON:  02/02/17 FINDINGS: The heart size and mediastinal contours are within normal limits. Both lungs are clear. The visualized skeletal structures are unremarkable. IMPRESSION: No active cardiopulmonary disease. Electronically Signed   By: Ulyses Jarred M.D.   On: 07/03/2017 00:39   Ct Head Wo Contrast  Result Date: 07/03/2017 CLINICAL DATA:  Fall, on blood thinners.  Question syncope. EXAM: CT HEAD WITHOUT CONTRAST TECHNIQUE:  Contiguous axial images were obtained from the base of the skull through the vertex without intravenous contrast. COMPARISON:  Brain MRI 01/01/2017 FINDINGS: Brain: Unchanged atrophy and chronic small vessel ischemia. No intracranial hemorrhage, mass effect, or midline shift. No hydrocephalus. The basilar cisterns are patent. No evidence of territorial infarct or acute ischemia. Scattered gyral calcifications. No extra-axial or intracranial fluid collection. Vascular: Advanced atherosclerosis of skull base vasculature. No hyperdense vessel. Skull: No fracture or focal lesion. Sinuses/Orbits: Frontal sinuses and left mastoid air cells slightly hypoplastic. No acute finding. Intramuscular calcifications about the right infratemporal fossa. Other: None. IMPRESSION: 1.  No acute intracranial abnormality.  No skull fracture. 2. Unchanged atrophy and chronic small vessel ischemia from prior MRI. Electronically Signed   By: Jeb Levering M.D.   On: 07/03/2017 02:23   Mr Brain Wo Contrast  Result Date: 07/03/2017 CLINICAL DATA:  Ataxia, stroke suspected. EXAM: MRI HEAD WITHOUT CONTRAST TECHNIQUE: Multiplanar, multiecho pulse sequences of the brain and surrounding structures were obtained without intravenous contrast. COMPARISON:  01/01/2017 FINDINGS: Brain: No acute infarction, hemorrhage, hydrocephalus, extra-axial collection or mass lesion. Chronic small vessel ischemia with confluent FLAIR hyperintensity in the deep white matter. Remote lacunar infarct in the right pons. Moderate generalized atrophy without progression. Vascular: Major flow voids are preserved. Skull and upper cervical spine: Negative for marrow lesion. Sinuses/Orbits: Bilateral cataract resection. IMPRESSION: 1. No acute finding. 2. Extensive chronic small vessel ischemia in the cerebral white matter. Remote lacunar infarct in the right pons. 3. Generalized atrophy. Electronically Signed   By: Monte Fantasia M.D.   On: 07/03/2017 11:06   Ct  Abdomen Pelvis W Contrast  Result Date: 07/03/2017 CLINICAL DATA:  Fall with rib pain. EXAM: CT ABDOMEN AND PELVIS WITH CONTRAST TECHNIQUE: Multidetector CT imaging of the abdomen and pelvis was performed using the standard protocol following bolus administration of intravenous contrast. CONTRAST:  100 mL Isovue-300 COMPARISON:  09/13/2006 FINDINGS: Lower chest: No basilar pulmonary nodules or pleural effusion. No apical pericardial effusion. Hepatobiliary: Normal hepatic contours and density. No visible biliary dilatation. Normal gallbladder. Pancreas: Normal parenchymal contours without ductal dilatation. No peripancreatic fluid collection. Spleen: Normal. Adrenals/Urinary Tract: --Adrenal glands: Normal. --Right kidney/ureter: 5 mm lower pole nonobstructive calculus. Lower pole renal cyst measures 2.3 cm. --Left kidney/ureter: No hydronephrosis, perinephric stranding or nephrolithiasis. No obstructing ureteral stones. --Urinary bladder: Normal appearance for the degree of distention. Stomach/Bowel: --Stomach/Duodenum: No hiatal hernia or other gastric abnormality. Normal duodenal course. --Small bowel: No dilatation or inflammation. --Colon: No focal abnormality. --Appendix: Normal. Vascular/Lymphatic: Juxtarenal abdominal aortic aneurysm measures 3.8 cm. Extensive atherosclerotic calcification of the abdominal aorta and its branch vessels. No abdominal or pelvic lymphadenopathy. Reproductive: No free fluid in the pelvis. Musculoskeletal. Grade 1 retrolisthesis at L2-L3 and L3-L4 with grade 1 anterolisthesis at L4-L5. No acute fracture. Other: None. IMPRESSION: 1. No acute abnormality. 2. Juxtarenal abdominal aortic aneurysm measuring 3.8 cm, new compared to 09/13/2006. Recommend followup by ultrasound in 2 years. This recommendation follows ACR consensus guidelines: White Paper of the ACR Incidental Findings Committee II on Vascular Findings. J Am Coll Radiol 2013; 10:789-794. 3. Nonobstructive right lower pole  nephrolithiasis. Electronically Signed   By: Ulyses Jarred M.D.   On: 07/03/2017 02:33   US Renal  Result Date: 07/03/2017 CLINICAL DATA:  Left flank pain EXAM: RENAL / URINARY TRACT ULTRASOUND COMPLETE COMPARISON:  CT abdomen pelvis 07/03/2017 FINDINGS: Right Kidney: Length: 10.5 cm. 2.2 x 2.7 cm left lower pole cyst. 5.6 mm right lower pole stone. Echogenicity within normal limits. No mass or hydronephrosis visualized. Left Kidney: Length: 11.0 cm. Echogenicity within normal limits. No mass or hydronephrosis visualized. Bladder: Appears normal for degree of bladder distention. IMPRESSION: 5.6 mm right renal calculus. Negative for renal obstruction or mass. Electronically Signed   By: Franchot Gallo M.D.   On: 07/03/2017 13:55   Dg Hip Unilat W Or Wo Pelvis 2-3 Views Left  Result Date: 07/03/2017 CLINICAL DATA:  Fall with bruising to the left hip EXAM: DG HIP (WITH OR WITHOUT PELVIS) 2-3V LEFT COMPARISON:  None. FINDINGS: Vascular calcifications. Pubic symphysis and rami are intact. No acute  displaced fracture or malalignment is seen. Minimal degenerative change. IMPRESSION: No acute osseous abnormality Electronically Signed   By: Donavan Foil M.D.   On: 07/03/2017 01:58    Microbiology: Recent Results (from the past 240 hour(s))  Urine culture     Status: Abnormal   Collection Time: 07/03/17  3:40 AM  Result Value Ref Range Status   Specimen Description   Final    URINE, CLEAN CATCH Performed at [facility], [place] [address], [place], [place] [address]    Special Requests   Final    NONE Performed at [facility], [place] [address], [place], [place] [address]    Culture >=100,000 COLONIES/mL ESCHERICHIA COLI (A)  Final   Report Status 07/05/2017 FINAL  Final   Organism ID, Bacteria ESCHERICHIA COLI (A)  Final      Susceptibility   Escherichia coli - MIC*    AMPICILLIN <=2 SENSITIVE Sensitive     CEFAZOLIN <=4 SENSITIVE Sensitive     CEFTRIAXONE <=1  SENSITIVE Sensitive     CIPROFLOXACIN <=0.25 SENSITIVE Sensitive     GENTAMICIN <=1 SENSITIVE Sensitive     IMIPENEM <=0.25 SENSITIVE Sensitive     NITROFURANTOIN <=16 SENSITIVE Sensitive     TRIMETH/SULFA <=20 SENSITIVE Sensitive     AMPICILLIN/SULBACTAM <=2 SENSITIVE Sensitive     PIP/TAZO <=4 SENSITIVE Sensitive     Extended ESBL NEGATIVE Sensitive     * >=100,000 COLONIES/mL ESCHERICHIA COLI  Culture, blood (routine x 2)     Status: None (Preliminary result)   Collection Time: 07/03/17  3:30 PM  Result Value Ref Range Status   Specimen Description   Final    BLOOD LEFT ANTECUBITAL Performed at [facility] [address], [place], [place] [address]    Special Requests   Final    BOTTLES DRAWN AEROBIC AND ANAEROBIC Blood Culture adequate volume Performed at [facility] [address] [place], [place] [address]    Culture   Final    NO GROWTH 2 DAYS Performed at [facility] [address], [place], [place] [address]    Report Status PENDING  Incomplete  Culture, blood (routine x 2)     Status: None (Preliminary result)   Collection Time: 07/03/17  7:10 PM  Result Value Ref Range Status   Specimen Description   Final    BLOOD LEFT ANTECUBITAL Performed at [facility] [address], [place], [place] [address]    Special Requests   Final    BOTTLES DRAWN AEROBIC AND ANAEROBIC Blood Culture adequate volume Performed at [facility] [address] [place], [place] [address]    Culture   Final    NO GROWTH 2 DAYS Performed at [facility] [address], [place], [place] [address]    Report Status PENDING  Incomplete     Labs: Basic Metabolic Panel: Recent Labs  Lab 07/03/17 0033 07/05/17 0530  NA 143 138  K 3.6 3.8  CL 101 99*  CO2 31 25  GLUCOSE 141* 150*  BUN 17 20  CREATININE 0.90 0.85  CALCIUM 9.1 9.3  MG  --  1.7   Liver Function Tests: No results for  input(s): AST, ALT, ALKPHOS, BILITOT, PROT, ALBUMIN in the last 168 hours. No results for input(s): LIPASE, AMYLASE in the last 168 hours. No results for input(s): AMMONIA in the last 168 hours. CBC: Recent Labs  Lab 07/03/17 0033 07/05/17 0530  WBC 8.9 8.9  HGB 13.0 14.0  HCT 41.3 42.6  MCV 94.7 90.8  PLT 211 234   Cardiac Enzymes: No results for input(s): CKTOTAL, CKMB, CKMBINDEX, TROPONINI in the last 168 hours. BNP: BNP (last 3 results) No results for input(s): BNP in the last 8760 hours.  ProBNP (last 3 results) No results for input(s): PROBNP in the last 8760 hours.  CBG: Recent Labs  Lab 07/04/17 2117  GLUCAP 140*       Signed:  Florencia Reasons MD, PhD  [facility] 07/05/2017, 5:39 PM

## 2017-07-05 NOTE — Progress Notes (Signed)
Occupational Therapy Treatment Patient Details Name: [name] MRN: [id number] DOB: 08/12/1933 Today's Date: 07/05/2017    History of present illness 84 y.o. female with medical history significant of HTN, [name], chronic pain, [name] ,prediabetes, arthritis, [name]; who presents after having a fall at home while in the bathroom    OT comments  Pt needed min guard A.  Family will A as needed but pt desires to regain her I  Follow Up Recommendations  No OT follow up;Supervision/Assistance - 24 hour    Equipment Recommendations  None recommended by OT    Recommendations for Other Services      Precautions / Restrictions Precautions Precautions: Fall Precaution Comments: L rib pain from fall prior to arrival       Mobility Bed Mobility Overal bed mobility: Needs Assistance Bed Mobility: Supine to Sit;Sit to Supine     Supine to sit: Min guard Sit to supine: Min guard      Transfers Overall transfer level: Needs assistance Equipment used: Rolling walker (2 wheeled) Transfers: Sit to/from [facility] Sit to Stand: Min guard Stand pivot transfers: Min guard       General transfer comment: verbal cues for safe technique, pt reports mild dizziness, HR 103 bpm    Balance Overall balance assessment: History of Falls                                         ADL either performed or assessed with clinical judgement   ADL Overall ADL's : Needs assistance/impaired     Grooming: Standing;Min guard           Upper Body Dressing : Set up;Sitting   Lower Body Dressing: Minimal assistance;Sit to/from stand;Cueing for safety;Cueing for sequencing   Toilet Transfer: Min guard;RW;Comfort height toilet;Ambulation   Toileting- Clothing Manipulation and Hygiene: Minimal assistance;Sit to/from stand;Cueing for safety;Cueing for sequencing   Tub/ Shower Transfer: Walk-in shower;Minimal assistance     General ADL Comments: Pt states her son cooks  and daughter will A her as needed.  Educated pt to have A to get in shower and not to do this alone.                Cognition Arousal/Alertness: Awake/alert Behavior During Therapy: Flat affect Overall Cognitive Status: Within Functional Limits for tasks assessed                                                     Pertinent Vitals/ Pain       Pain Score: 3  Pain Location: L rib area Pain Descriptors / Indicators: Sore Pain Intervention(s): Limited activity within patient's tolerance     Prior Functioning/Environment              Frequency  Min 2X/week        Progress Toward Goals  OT Goals(current goals can now be found in the care plan section)  Progress towards OT goals: Progressing toward goals  Acute Rehab OT Goals Patient Stated Goal: home with sons  Plan Discharge plan remains appropriate       AM-PAC PT "6 Clicks" Daily Activity     Outcome Measure   Help from another person eating meals?: None Help from another person taking  care of personal grooming?: A Little Help from another person toileting, which includes using toliet, bedpan, or urinal?: A Little Help from another person bathing (including washing, rinsing, drying)?: A Little Help from another person to put on and taking off regular upper body clothing?: None Help from another person to put on and taking off regular lower body clothing?: A Little 6 Click Score: 20    End of Session Equipment Utilized During Treatment: Rolling walker  OT Visit Diagnosis: Unsteadiness on feet (R26.81);History of falling (Z91.81);Muscle weakness (generalized) (M62.81)   Activity Tolerance Patient tolerated treatment well   Patient Left in bed;with nursing/sitter in room   Nurse Communication Mobility status        Time: 9381-8299 OT Time Calculation (min): 12 min  Charges: OT General Charges $OT Visit: 1 Visit OT Treatments $Self Care/Home Management : 8-22 mins  [place], [place]   Payton Mccallum D 07/05/2017, 1:03 PM

## 2017-07-05 NOTE — Care Management Note (Signed)
Case Management Note  Patient Details  Name: LYANN HAGSTROM MRN: [id number] Date of Birth: [birth year]  Subjective/Objective:Patient deferred to son-TC Shane on phone about d/c plans-PT recc HHPT-Shane chose [facility] rep Santiago Glad aware of HHPT, & d/c. No further CM needs.                    Action/Plan:d/c home w/HHC.   Expected Discharge Date:  07/05/17               Expected Discharge Plan:  [place]  In-House Referral:     Discharge planning Services  CM Consult  Post Acute Care Choice:  Durable Medical Equipment(has rw) Choice offered to:  Adult Children  DME Arranged:    DME Agency:     HH Arranged:  PT HH Agency:  [facility]  Status of Service:     If discussed at [place] of Stay Meetings, dates discussed:    Additional Comments:  Dessa Phi, RN 07/05/2017, 4:14 PM

## 2017-07-08 LAB — CULTURE, BLOOD (ROUTINE X 2)
Culture: NO GROWTH
Culture: NO GROWTH
Special Requests: ADEQUATE
Special Requests: ADEQUATE

## 2017-07-10 DIAGNOSIS — W19XXXD Unspecified fall, subsequent encounter: Secondary | ICD-10-CM | POA: Diagnosis not present

## 2017-07-10 DIAGNOSIS — I4891 Unspecified atrial fibrillation: Secondary | ICD-10-CM | POA: Diagnosis not present

## 2017-07-10 DIAGNOSIS — Z9181 History of falling: Secondary | ICD-10-CM | POA: Diagnosis not present

## 2017-07-10 DIAGNOSIS — G894 Chronic pain syndrome: Secondary | ICD-10-CM | POA: Diagnosis not present

## 2017-07-10 DIAGNOSIS — I714 Abdominal aortic aneurysm, without rupture: Secondary | ICD-10-CM | POA: Diagnosis not present

## 2017-07-10 DIAGNOSIS — M8580 Other specified disorders of bone density and structure, unspecified site: Secondary | ICD-10-CM | POA: Diagnosis not present

## 2017-07-10 DIAGNOSIS — F0390 Unspecified dementia without behavioral disturbance: Secondary | ICD-10-CM | POA: Diagnosis not present

## 2017-07-10 DIAGNOSIS — G4733 Obstructive sleep apnea (adult) (pediatric): Secondary | ICD-10-CM | POA: Diagnosis not present

## 2017-07-10 DIAGNOSIS — Y92002 Bathroom of unspecified non-institutional (private) residence single-family (private) house as the place of occurrence of the external cause: Secondary | ICD-10-CM | POA: Diagnosis not present

## 2017-07-10 DIAGNOSIS — Z87440 Personal history of urinary (tract) infections: Secondary | ICD-10-CM | POA: Diagnosis not present

## 2017-07-10 DIAGNOSIS — R7303 Prediabetes: Secondary | ICD-10-CM | POA: Diagnosis not present

## 2017-07-10 DIAGNOSIS — M069 Rheumatoid arthritis, unspecified: Secondary | ICD-10-CM | POA: Diagnosis not present

## 2017-07-10 DIAGNOSIS — S2020XD Contusion of thorax, unspecified, subsequent encounter: Secondary | ICD-10-CM | POA: Diagnosis not present

## 2017-07-10 DIAGNOSIS — Z96653 Presence of artificial knee joint, bilateral: Secondary | ICD-10-CM | POA: Diagnosis not present

## 2017-07-10 DIAGNOSIS — I10 Essential (primary) hypertension: Secondary | ICD-10-CM | POA: Diagnosis not present

## 2017-07-10 DIAGNOSIS — Z96641 Presence of right artificial hip joint: Secondary | ICD-10-CM | POA: Diagnosis not present

## 2017-07-10 DIAGNOSIS — M1990 Unspecified osteoarthritis, unspecified site: Secondary | ICD-10-CM | POA: Diagnosis not present

## 2017-07-10 DIAGNOSIS — M4800 Spinal stenosis, site unspecified: Secondary | ICD-10-CM | POA: Diagnosis not present

## 2017-07-10 DIAGNOSIS — Z8673 Personal history of transient ischemic attack (TIA), and cerebral infarction without residual deficits: Secondary | ICD-10-CM | POA: Diagnosis not present

## 2017-07-10 DIAGNOSIS — R2689 Other abnormalities of gait and mobility: Secondary | ICD-10-CM | POA: Diagnosis not present

## 2017-07-11 ENCOUNTER — Other Ambulatory Visit: Payer: Self-pay | Admitting: *Deleted

## 2017-07-11 NOTE — Patient Outreach (Signed)
[facility]) Care Management  07/11/2017  [name] Sep 05, 1933 [id number]  Referral via EMMI-Discharge-Red Alert Day #1, 07/08/2017: Reason: Know who to call about changes in condition? "no"  Telephone call to patient who was advised of reason for call. HIPPA verification received from patient.   Patient voices that she was hospitalized recently following a fall at home. States she did not have any broken bones but did have infection (UTI) that was treated. States she is back home and has [place] services in place and does have their contact number to call if problems. Patient was advised of importance of also having primary care provider contact information to call to report signs of health conditions that may be getting worse. Patient voices that her son has all of her doctors'  numbers and calls them as needed.  Patient was advised of symptoms of UTI that needed to be reported to MD office. States she is not having worsening symptoms. Voices understanding of what to report to MD office.    States her son is trying to get hospital follow up appointment with MD this week. States he provides transportation to MD appointments for her, manages her medications & she takes as ordered by MD. Patient voices understanding of importance of taking medications as prescribed.    States no further concerns. EMMI has been addressed.    Plan: Send to care management assistant for case closure.    Sherrin Daisy, RN BSN [place] Management Coordinator [facility] Care Management  [phone]

## 2017-07-12 DIAGNOSIS — G894 Chronic pain syndrome: Secondary | ICD-10-CM | POA: Diagnosis not present

## 2017-07-12 DIAGNOSIS — I4891 Unspecified atrial fibrillation: Secondary | ICD-10-CM | POA: Diagnosis not present

## 2017-07-12 DIAGNOSIS — Z87440 Personal history of urinary (tract) infections: Secondary | ICD-10-CM | POA: Diagnosis not present

## 2017-07-12 DIAGNOSIS — R2689 Other abnormalities of gait and mobility: Secondary | ICD-10-CM | POA: Diagnosis not present

## 2017-07-12 DIAGNOSIS — S2020XD Contusion of thorax, unspecified, subsequent encounter: Secondary | ICD-10-CM | POA: Diagnosis not present

## 2017-07-12 DIAGNOSIS — M4800 Spinal stenosis, site unspecified: Secondary | ICD-10-CM | POA: Diagnosis not present

## 2017-07-17 DIAGNOSIS — I4891 Unspecified atrial fibrillation: Secondary | ICD-10-CM | POA: Diagnosis not present

## 2017-07-17 DIAGNOSIS — G894 Chronic pain syndrome: Secondary | ICD-10-CM | POA: Diagnosis not present

## 2017-07-17 DIAGNOSIS — M4800 Spinal stenosis, site unspecified: Secondary | ICD-10-CM | POA: Diagnosis not present

## 2017-07-17 DIAGNOSIS — R2689 Other abnormalities of gait and mobility: Secondary | ICD-10-CM | POA: Diagnosis not present

## 2017-07-17 DIAGNOSIS — S2020XD Contusion of thorax, unspecified, subsequent encounter: Secondary | ICD-10-CM | POA: Diagnosis not present

## 2017-07-17 DIAGNOSIS — Z87440 Personal history of urinary (tract) infections: Secondary | ICD-10-CM | POA: Diagnosis not present

## 2017-07-18 ENCOUNTER — Ambulatory Visit: Payer: Medicare Other | Admitting: Podiatry

## 2017-07-19 DIAGNOSIS — Z87440 Personal history of urinary (tract) infections: Secondary | ICD-10-CM | POA: Diagnosis not present

## 2017-07-19 DIAGNOSIS — M4800 Spinal stenosis, site unspecified: Secondary | ICD-10-CM | POA: Diagnosis not present

## 2017-07-19 DIAGNOSIS — I4891 Unspecified atrial fibrillation: Secondary | ICD-10-CM | POA: Diagnosis not present

## 2017-07-19 DIAGNOSIS — G894 Chronic pain syndrome: Secondary | ICD-10-CM | POA: Diagnosis not present

## 2017-07-19 DIAGNOSIS — R2689 Other abnormalities of gait and mobility: Secondary | ICD-10-CM | POA: Diagnosis not present

## 2017-07-19 DIAGNOSIS — S2020XD Contusion of thorax, unspecified, subsequent encounter: Secondary | ICD-10-CM | POA: Diagnosis not present

## 2017-07-24 ENCOUNTER — Ambulatory Visit (INDEPENDENT_AMBULATORY_CARE_PROVIDER_SITE_OTHER): Payer: Medicare Other | Admitting: Family Medicine

## 2017-07-24 ENCOUNTER — Encounter: Payer: Self-pay | Admitting: Family Medicine

## 2017-07-24 VITALS — BP 128/74 | HR 88 | Temp 98.5°F | Resp 16 | Ht 60.0 in | Wt 174.0 lb

## 2017-07-24 DIAGNOSIS — M4800 Spinal stenosis, site unspecified: Secondary | ICD-10-CM | POA: Diagnosis not present

## 2017-07-24 DIAGNOSIS — I4819 Other persistent atrial fibrillation: Secondary | ICD-10-CM

## 2017-07-24 DIAGNOSIS — I6381 Other cerebral infarction due to occlusion or stenosis of small artery: Secondary | ICD-10-CM

## 2017-07-24 DIAGNOSIS — M545 Low back pain: Secondary | ICD-10-CM

## 2017-07-24 DIAGNOSIS — G894 Chronic pain syndrome: Secondary | ICD-10-CM | POA: Diagnosis not present

## 2017-07-24 DIAGNOSIS — I481 Persistent atrial fibrillation: Secondary | ICD-10-CM

## 2017-07-24 DIAGNOSIS — Z87440 Personal history of urinary (tract) infections: Secondary | ICD-10-CM | POA: Diagnosis not present

## 2017-07-24 DIAGNOSIS — S2020XD Contusion of thorax, unspecified, subsequent encounter: Secondary | ICD-10-CM | POA: Diagnosis not present

## 2017-07-24 DIAGNOSIS — R2689 Other abnormalities of gait and mobility: Secondary | ICD-10-CM | POA: Diagnosis not present

## 2017-07-24 DIAGNOSIS — Z09 Encounter for follow-up examination after completed treatment for conditions other than malignant neoplasm: Secondary | ICD-10-CM

## 2017-07-24 DIAGNOSIS — I4891 Unspecified atrial fibrillation: Secondary | ICD-10-CM | POA: Diagnosis not present

## 2017-07-24 LAB — URINALYSIS, ROUTINE W REFLEX MICROSCOPIC
Bacteria, UA: NONE SEEN /HPF
Bilirubin Urine: NEGATIVE
Glucose, UA: NEGATIVE
Hgb urine dipstick: NEGATIVE
Ketones, ur: NEGATIVE
Nitrite: NEGATIVE
Protein, ur: NEGATIVE
Specific Gravity, Urine: 1.025 (ref 1.001–1.03)
pH: 6 (ref 5.0–8.0)

## 2017-07-24 LAB — MICROSCOPIC MESSAGE

## 2017-07-24 NOTE — Progress Notes (Signed)
Subjective:    Patient ID: [name], female    DOB: [birth year], 84 y.o.   MRN: [id number]  HPI Unfortunately was recently admitted to the hospital.  I have copied relevant portions of the DC summary below for reference:  Admit date: 07/03/2017 Discharge date: 07/05/2017  Time spent: >55mins, more than 50% time spent on coordination of care.  Recommendations for Outpatient Follow-up:  1. F/u with PMD within a week  for hospital discharge follow up, repeat cbc/bmp at follow up 2. Home health arranged  Discharge Diagnoses:      Active Hospital Problems   Diagnosis Date Noted  . Fall at home, initial encounter 07/03/2017  . UTI (urinary tract infection) 07/03/2017  . A-fib ([place]) 07/03/2017  . Chronic pain syndrome 02/02/2017  . [name] (abdominal aortic aneurysm) ([place]) 09/30/2013  . Essential hypertension 03/11/2006    Resolved Hospital Problems  No resolved problems to display.    Discharge Condition: stable  Diet recommendation: heart healthy      Filed Weights   07/02/17 2353 07/03/17 1853  Weight: 75.8 kg (167 lb) 76.7 kg (169 lb 3.2 oz)    History of present illness: (per admitting MD Dr Yvonne Julian) [name], Yvonne Mcgee, MD Patient coming from:Home  Chief Complaint:Fall  I have personally briefly reviewed patient's old medical records in [place] [facility]   GYJ:EHUDJ Yvonne Hicksis a 84 y.o.femalewith medical history significant ofHTN, [name], chronic pain,[name],prediabetes, arthritis,[name];[name] Patient reports not knowing what caused her to fall. Denies any loss of consciousness, lightheadedness, chest pain, palpitations, or trauma to her head. She did strike the left side of her chest and complains of some tenderness there. Over the last day or so she had some generalized weakness.She had gone to her primary care provider for a cough and had been prescribed and  completed a course of azithromycin just a few days ago. Denies having any fever, chills, dysuria, abdominal pain, nausea, vomiting, diarrhea, or p.o. intake. Her son who lives at home with her heard her fall but did not witness it and came upstairs and helped her up. Patient makes note that she has heard that she has had any irregular heart rhythm during her previous hospitalization in October. She reports being on Plavix for history of TIAs.  ED Course:Upon admission into the emergency department patient was noted to be afebrile with relatively normal vital signsand negative orthostatics vital signs. Labs are unremarkable. X-rays of her hip and chest were negative for any acute abnormalities. CT scan brain showed no acute abnormalities. CT of the abdomen showed a 3.8 cm abdominal aortic aneurysm. Patient noted to be in atrial fibrillation on EKG.     Hospital Course:  Principal Problem:   Fall at home, initial encounter Active Problems:   Essential hypertension   [name] (abdominal aortic aneurysm) (HCC)   Chronic pain syndrome   UTI (urinary tract infection)   A-fib ([place])   Fall at home: -with left sided rib Pain, cxr no acute findings. -Acute. Patient reports not knowing the cause of her fall. -No focal deficit, no significant drop in orthostasis. CT head as well as MRI brain negative for any acute stroke. -tele did show afib but rate controlled, echo lvef wnl. Troponin negative -PT recommends home health.  Urinary tract infection:Acute. Patient found to have urinalysis with positive nitrites, small leukocytes, rare bacteria, and 6-30 WBCs.  Pan sensitive E. coli growing in  the urine. blood culture no growth. Patient was started on Rocephin IV on admission, she is discharged on keflex  Nephrolithiasis:Incidental finding on CT of the abdomen noting right nephrolithiasis without obstruction. With h/o recurrent uti, she is advised to follow up with urology on  outpatient basis.  Bronchitis: Patient was seen on 2/28 for cough at pcp office. On presentation, she has wheezing, cxr no acute findings, she is already on abx, she is treated with nebs and steroids, wheezing and cough resolved at discharge. She is discharged on abx and steroids taper.   Atrial fibrillation:Patient presents in atrial fibrillation appears rate controlled. CHADsVASc scoreequalsat least 6.Appears to be somewhat of a high falls risk. Has-bled score equals at least 3.she does has risk of falls.Patient seen previously go into atrial fibrillation transiently as a result of infection during her 01/2017 admission. -Normalon TSH -Echo cardiogram shows preserved EF, no significant valvular abnormality. -rate controlled on atenolol -Recommend to discuss with PCP/cardiology regarding anticoagulation as an outpatient.  Essential hypertension -home med hctz held on admission, resume at discharge, Continue atenolol   Chronic pain -Continue home medsCymbaltaand Opana  History of [name] -Continue Plavix /statin/atenolol   Hyperlipidemia -Continue Crestor  OSA:not on CPAP  KVQ:QVZDGL. Incidentally noted on CT scan of the chest measuring 3.8 cm. Previously noted to be 3.6 cmin 2015. -Follow-up repeat ultrasound recommended in 2 years     07/24/17 Patient has a history of lacunar infarcts that have been treated with Plavix up until now.  However she is now in persistent atrial fibrillation.  She is still in atrial fibrillation this morning in her hospital follow-up.  She denies any chest pain shortness of breath or dyspnea on exertion.  She is unable to feel the change in her heart rhythm.  She denies any headaches.  She denies any neurologic deficits.  She denies any palpitations or syncope.  She does occasionally feel dizzy.  She is on Opana under the care of a pain specialist.  She does have a history of falls most recently that prompted her  hospitalization.  Today we spent more than 25 minutes discussing the diagnosis and implication of atrial fibrillation as well as treatment options and prognosis Past Medical History:  Diagnosis Date  . [name] (abdominal aortic aneurysm) ([place]) 09/2013   3.6 cm  . Dementia   . Diabetes mellitus    pre-diabetes  . Diverticulosis   . Hyperlipidemia   . Hypertension   . Nephrolithiasis   . OSA on CPAP   . Osteoarthritis   . Osteopenia    T=  -2.1 in hip  . Rheumatoid arthritis(714.0)   . S/P carpal tunnel release   . Spinal stenosis    Past Surgical History:  Procedure Laterality Date  . APPENDECTOMY    . CARPAL TUNNEL RELEASE  12/27/2006   Right subcutaneous ulnar nerve transfer -- Right open carpal tunnel release  . CHOLECYSTECTOMY    . FOOT SURGERY    . JOINT REPLACEMENT     BTKR, RTHR  . TOTAL KNEE ARTHROPLASTY  08/21/2007   Right total knee replacement   Current Outpatient Medications on File Prior to Visit  Medication Sig Dispense Refill  . atenolol (TENORMIN) 25 MG tablet TAKE 1 TABLET(25 MG) BY MOUTH DAILY 90 tablet 3  . clopidogrel (PLAVIX) 75 MG tablet Take 1 tablet (75 mg total) by mouth daily. 90 tablet 3  . DULoxetine (CYMBALTA) 30 MG capsule Take 30 mg by mouth 3 (three) times daily.   2  .  gabapentin (NEURONTIN) 800 MG tablet TAKE 1 TABLET BY MOUTH EVERY MORNING AND AT LUNCH, 2 TABLETS BY MOUTH EVERY NIGHT AT BEDTIME 360 tablet 4  . hydrochlorothiazide (HYDRODIURIL) 25 MG tablet Take 1 tablet (25 mg total) by mouth daily. 90 tablet 1  . oxymorphone (OPANA) 5 MG tablet Take 5 mg by mouth 3 (three) times daily.    . rosuvastatin (CRESTOR) 40 MG tablet Take 1 tablet (40 mg total) by mouth daily. 90 tablet 3   No current facility-administered medications on file prior to visit.    Allergies  Allergen Reactions  . Sulfonamide Derivatives Hives and Rash   Social History   Socioeconomic History  . Marital status: Divorced    Spouse name: Not on file  . Number of  children: 6  . Years of education: Not on file  . Highest education level: Not on file  Occupational History  . Not on file  Social Needs  . Financial resource strain: Not on file  . Food insecurity:    Worry: Not on file    Inability: Not on file  . Transportation needs:    Medical: Not on file    Non-medical: Not on file  Tobacco Use  . Smoking status: Never Smoker  . Smokeless tobacco: Never Used  Substance and Sexual Activity  . Alcohol use: No  . Drug use: No  . Sexual activity: Not on file  Lifestyle  . Physical activity:    Days per week: Not on file    Minutes per session: Not on file  . Stress: Not on file  Relationships  . Social connections:    Talks on phone: Not on file    Gets together: Not on file    Attends religious service: Not on file    Active member of club or organization: Not on file    Attends meetings of clubs or organizations: Not on file    Relationship status: Not on file  . Intimate partner violence:    Fear of current or ex partner: Not on file    Emotionally abused: Not on file    Physically abused: Not on file    Forced sexual activity: Not on file  Other Topics Concern  . Not on file  Social History Narrative  . Not on file      Review of Systems  All other systems reviewed and are negative.      Objective:   Physical Exam  Constitutional: She appears well-developed and well-nourished.  Neck: No JVD present. No thyromegaly present.  Cardiovascular: Normal rate and normal heart sounds. An irregularly irregular rhythm present.  Pulmonary/Chest: Effort normal and breath sounds normal. No respiratory distress. She has no wheezes. She has no rales.  Abdominal: Soft. Bowel sounds are normal. She exhibits no distension. There is no tenderness. There is no rebound and no guarding.  Musculoskeletal: She exhibits no edema.  Vitals reviewed.         Assessment & Plan:  Low back pain, unspecified back pain laterality, unspecified  chronicity, with sciatica presence unspecified - Plan: Urinalysis, Routine w reflex microscopic  Hospital discharge follow-up - Plan: CBC with Differential/Platelet, COMPLETE METABOLIC PANEL WITH GFR, Urine Culture  Persistent atrial fibrillation (HCC)  Lacunar infarction  I will recheck a urinalysis and urine culture to ensure resolution of her recent UTI.  Spent more than 25 minutes discussing atrial fibrillation with the patient and her son.  I explained that this puts her at extremely high risk  for future stroke however changing her blood thinners could be dangerous given her propensity to fall.  Ultimately we decided to discontinue Plavix and replace it with eliquis 5 mg pobid.  She has an appointment with her cardiologist later this week and she would like to discuss this with them as well.  If they decide to stay on this medication, they will call me back and I will call out a prescription for the medication and removed Plavix from her medicine list.  This would be my recommendation.  However I did explain the risk of falls and I recommended discussing with her pain management specialist trying to wean down or change her chronic pain medication to reduce her risk of falls as much as possible.

## 2017-07-24 NOTE — Progress Notes (Signed)
Cardiology Office Note   Date:  07/26/2017   ID:  [name], DOB January 31, 1934, MRN [id number]  PCP:  [name], MD  Cardiologist: Dr. Stanford Breed Chief Complaint  Patient presents with  . Hospitalization Follow-up  . Atrial Fibrillation  . [name]     History of Present Illness: [name] is a 84 y.o. female who presents for ongoing assessment and management of a small [name] followed by vascular surgery, the patient also had complaints of dyspnea on exertion most recent echocardiogram January 2013 revealed mild reversible defect in the apex, felt possibly to represent ischemia, but because it was a low risk the patient was not scheduled for further testing.  She was continued on medical therapy.  If she developed new symptoms we would discuss cardiac catheterization.  She comes today having been diagnosed with atrial fib and now is placed on Eliquis 5 mg BID per her PCP but not started until seen by cardiology. She denies any complaints of chest pain, dizziness, dyspnea or HR elevation.   Past Medical History:  Diagnosis Date  . [name] (abdominal aortic aneurysm) ([place]) 09/2013   3.6 cm  . Dementia   . Diabetes mellitus    pre-diabetes  . Diverticulosis   . Hyperlipidemia   . Hypertension   . Nephrolithiasis   . OSA on CPAP   . Osteoarthritis   . Osteopenia    T=  -2.1 in hip  . Rheumatoid arthritis(714.0)   . S/P carpal tunnel release   . Spinal stenosis     Past Surgical History:  Procedure Laterality Date  . APPENDECTOMY    . CARPAL TUNNEL RELEASE  12/27/2006   Right subcutaneous ulnar nerve transfer -- Right open carpal tunnel release  . CHOLECYSTECTOMY    . FOOT SURGERY    . JOINT REPLACEMENT     BTKR, RTHR  . TOTAL KNEE ARTHROPLASTY  08/21/2007   Right total knee replacement     Current Outpatient Medications  Medication Sig Dispense Refill  . atenolol (TENORMIN) 25 MG tablet TAKE 1 TABLET(25 MG) BY MOUTH DAILY 90 tablet 3  . DULoxetine (CYMBALTA) 30 MG  capsule Take 30 mg by mouth 3 (three) times daily.   2  . gabapentin (NEURONTIN) 800 MG tablet TAKE 1 TABLET BY MOUTH EVERY MORNING AND AT LUNCH, 2 TABLETS BY MOUTH EVERY NIGHT AT BEDTIME 360 tablet 4  . hydrochlorothiazide (HYDRODIURIL) 25 MG tablet Take 1 tablet (25 mg total) by mouth daily. 90 tablet 1  . oxymorphone (OPANA) 5 MG tablet Take 5 mg by mouth 3 (three) times daily.    . rosuvastatin (CRESTOR) 40 MG tablet Take 1 tablet (40 mg total) by mouth daily. 90 tablet 3  . apixaban (ELIQUIS) 5 MG TABS tablet Take 1 tablet (5 mg total) by mouth 2 (two) times daily. 60 tablet 3   No current facility-administered medications for this visit.     Allergies:   Sulfonamide derivatives    Social History:  The patient  reports that she has never smoked. She has never used smokeless tobacco. She reports that she does not drink alcohol or use drugs.   Family History:  The patient's family history includes Coronary artery disease in her son; Diabetes in her brother; Heart attack in her son; Heart disease in her son; Hypertension in her maternal aunt and sister; Stroke in her father and mother.    ROS: All other systems are reviewed and negative. Unless otherwise mentioned in H&P  PHYSICAL EXAM: VS:  BP 118/76 (BP Location: Right Arm, Patient Position: Sitting, Cuff Size: Normal)   Pulse 98   Ht 5' (1.524 m)   Wt 169 lb 9.6 oz (76.9 kg)   SpO2 94%   BMI 33.12 kg/m  , BMI Body mass index is 33.12 kg/m. GEN: Well nourished, well developed, in no acute distress  HEENT: normal  Neck: no JVD, carotid bruits, or masses Cardiac: IRRR; no murmurs, rubs, or gallops,no edema  Respiratory:  Clear to auscultation bilaterally, normal work of breathing GI: soft, nontender, nondistended, + BS MS: no deformity or atrophy  Skin: warm and dry, no rash Neuro:  Strength and sensation are intact Psych: euthymic mood, full affect   EKG:  Atrial fib, rate of 98 bpm.   Recent Labs: 07/03/2017: TSH  1.199 07/05/2017: Magnesium 1.7 07/24/2017: ALT 15; BUN 19; Creat 0.87; Hemoglobin 12.8; Platelets 162; Potassium 4.1; Sodium 141    Lipid Panel    Component Value Date/Time   CHOL 97 04/17/2017 0931   TRIG 105 04/17/2017 0931   HDL 42 (L) 04/17/2017 0931   CHOLHDL 2.3 04/17/2017 0931   VLDL 26 06/17/2016 1045   LDLCALC 36 04/17/2017 0931      Wt Readings from Last 3 Encounters:  07/26/17 169 lb 9.6 oz (76.9 kg)  07/24/17 174 lb (78.9 kg)  07/03/17 169 lb 3.2 oz (76.7 kg)      Other studies Reviewed: Echocardiogram  07/03/2017 Left ventricle: The cavity size was normal. There was mild   concentric hypertrophy. Systolic function was normal. The   estimated ejection fraction was in the range of 60% to 65%. Wall   motion was normal; there were no regional wall motion   abnormalities. - Mitral valve: Calcified annulus. - Left atrium: The atrium was moderately dilated. - Pulmonary arteries: PA peak pressure: 36 mm Hg (S).  Impressions:  - The right ventricular systolic pressure was increased consistent   with mild pulmonary hypertension.  ASSESSMENT AND PLAN:  1. New onset atrial fib: Heart rate is well controlled. No complaints of racing heart rate. She will be switched to Eliquis, continue ASA, and stop Plavix. Consider cardioversion once she is on Eliquis and tolerating this. LA is moderately enlarged. She is unaware of irregular rhythm and is asymptomatic,   2. Hx of [name]: Followed by CVTS    3. Hypertension: BP is currently well controlled., No changes to her medications.   Current medicines are reviewed at length with the patient today.    Labs/ tests ordered today include: CBC and BMET  Phill Myron. West Pugh, ANP, AACC   07/26/2017 12:24 PM    [facility] [address] [place],  [address] Phone: [phone]; Fax: [phone]

## 2017-07-25 ENCOUNTER — Ambulatory Visit: Payer: Medicare Other | Admitting: Podiatry

## 2017-07-25 LAB — URINE CULTURE
MICRO NUMBER:: 90369863
Result:: NO GROWTH
SPECIMEN QUALITY:: ADEQUATE

## 2017-07-25 LAB — COMPLETE METABOLIC PANEL WITH GFR
AG Ratio: 1.4 (calc) (ref 1.0–2.5)
ALT: 15 U/L (ref 6–29)
AST: 20 U/L (ref 10–35)
Albumin: 4 g/dL (ref 3.6–5.1)
Alkaline phosphatase (APISO): 51 U/L (ref 33–130)
BUN: 19 mg/dL (ref 7–25)
CO2: 29 mmol/L (ref 20–32)
Calcium: 9.1 mg/dL (ref 8.6–10.4)
Chloride: 101 mmol/L (ref 98–110)
Creat: 0.87 mg/dL (ref 0.60–0.88)
GFR, Est African American: 72 mL/min/{1.73_m2} (ref >=60)
GFR, Est Non African American: 62 mL/min/{1.73_m2} (ref >=60)
Globulin: 2.8 g/dL (calc) (ref 1.9–3.7)
Glucose, Bld: 92 mg/dL (ref 65–99)
Potassium: 4.1 mmol/L (ref 3.5–5.3)
Sodium: 141 mmol/L (ref 135–146)
Total Bilirubin: 0.7 mg/dL (ref 0.2–1.2)
Total Protein: 6.8 g/dL (ref 6.1–8.1)

## 2017-07-25 LAB — CBC WITH DIFFERENTIAL/PLATELET
Basophils Absolute: 67 cells/uL (ref 0–200)
Basophils Relative: 0.8 %
Eosinophils Absolute: 218 cells/uL (ref 15–500)
Eosinophils Relative: 2.6 %
HCT: 38.4 % (ref 35.0–45.0)
Hemoglobin: 12.8 g/dL (ref 11.7–15.5)
Lymphs Abs: 2856 cells/uL (ref 850–3900)
MCH: 29.8 pg (ref 27.0–33.0)
MCHC: 33.3 g/dL (ref 32.0–36.0)
MCV: 89.5 fL (ref 80.0–100.0)
MPV: 10.4 fL (ref 7.5–12.5)
Monocytes Relative: 5.7 %
Neutro Abs: 4780 cells/uL (ref 1500–7800)
Neutrophils Relative %: 56.9 %
Platelets: 162 10*3/uL (ref 140–400)
RBC: 4.29 10*6/uL (ref 3.80–5.10)
RDW: 13.8 % (ref 11.0–15.0)
Total Lymphocyte: 34 %
WBC mixed population: 479 cells/uL (ref 200–950)
WBC: 8.4 10*3/uL (ref 3.8–10.8)

## 2017-07-26 ENCOUNTER — Ambulatory Visit (INDEPENDENT_AMBULATORY_CARE_PROVIDER_SITE_OTHER): Payer: Medicare Other | Admitting: Adult Health

## 2017-07-26 ENCOUNTER — Encounter: Payer: Self-pay | Admitting: Adult Health

## 2017-07-26 VITALS — BP 118/76 | HR 98 | Ht 60.0 in | Wt 169.6 lb

## 2017-07-26 DIAGNOSIS — Z79899 Other long term (current) drug therapy: Secondary | ICD-10-CM | POA: Diagnosis not present

## 2017-07-26 DIAGNOSIS — I10 Essential (primary) hypertension: Secondary | ICD-10-CM | POA: Diagnosis not present

## 2017-07-26 DIAGNOSIS — I6381 Other cerebral infarction due to occlusion or stenosis of small artery: Secondary | ICD-10-CM

## 2017-07-26 DIAGNOSIS — I4891 Unspecified atrial fibrillation: Secondary | ICD-10-CM

## 2017-07-26 DIAGNOSIS — I714 Abdominal aortic aneurysm, without rupture, unspecified: Secondary | ICD-10-CM

## 2017-07-26 MED ORDER — APIXABAN 5 MG PO TABS: 5.0000 mg | ORAL_TABLET | Freq: Two times a day (BID) | ORAL | 3 refills | Status: DC

## 2017-07-26 NOTE — Patient Instructions (Signed)
Medication Instructions:  STOP PLAVIX  START ELIQUIS 5MG  TWICE DAILY  If you need a refill on your cardiac medications before your next appointment, please call your pharmacy.  Labwork: BMET AND CBC TODAY HERE IN OUR OFFICE AT [facility]  Take the provided lab slips for you to take with you to the lab for you blood draw.   Follow-Up: Your physician wants you to follow-up in: [place] ([place]), DNP.   Thank you for choosing [facility]!!

## 2017-07-27 LAB — BASIC METABOLIC PANEL
BUN/Creatinine Ratio: 16 (ref 12–28)
BUN: 14 mg/dL (ref 8–27)
CO2: 25 mmol/L (ref 20–29)
Calcium: 9.7 mg/dL (ref 8.7–10.3)
Chloride: 99 mmol/L (ref 96–106)
Creatinine, Ser: 0.89 mg/dL (ref 0.57–1.00)
GFR calc Af Amer: 70 mL/min/{1.73_m2} (ref >59)
GFR calc non Af Amer: 61 mL/min/{1.73_m2} (ref >59)
Glucose: 112 mg/dL — ABNORMAL HIGH (ref 65–99)
Potassium: 4.4 mmol/L (ref 3.5–5.2)
Sodium: 140 mmol/L (ref 134–144)

## 2017-07-27 LAB — CBC
Hematocrit: 41.2 % (ref 34.0–46.6)
Hemoglobin: 13.4 g/dL (ref 11.1–15.9)
MCH: 29.6 pg (ref 26.6–33.0)
MCHC: 32.5 g/dL (ref 31.5–35.7)
MCV: 91 fL (ref 79–97)
Platelets: 170 10*3/uL (ref 150–379)
RBC: 4.52 x10E6/uL (ref 3.77–5.28)
RDW: 14.8 % (ref 12.3–15.4)
WBC: 7.5 10*3/uL (ref 3.4–10.8)

## 2017-07-28 DIAGNOSIS — G894 Chronic pain syndrome: Secondary | ICD-10-CM | POA: Diagnosis not present

## 2017-07-28 DIAGNOSIS — Z87440 Personal history of urinary (tract) infections: Secondary | ICD-10-CM | POA: Diagnosis not present

## 2017-07-28 DIAGNOSIS — I4891 Unspecified atrial fibrillation: Secondary | ICD-10-CM | POA: Diagnosis not present

## 2017-07-28 DIAGNOSIS — S2020XD Contusion of thorax, unspecified, subsequent encounter: Secondary | ICD-10-CM | POA: Diagnosis not present

## 2017-07-28 DIAGNOSIS — M4800 Spinal stenosis, site unspecified: Secondary | ICD-10-CM | POA: Diagnosis not present

## 2017-07-28 DIAGNOSIS — R2689 Other abnormalities of gait and mobility: Secondary | ICD-10-CM | POA: Diagnosis not present

## 2017-08-01 ENCOUNTER — Ambulatory Visit (INDEPENDENT_AMBULATORY_CARE_PROVIDER_SITE_OTHER): Payer: Medicare Other | Admitting: Podiatry

## 2017-08-01 ENCOUNTER — Encounter: Payer: Self-pay | Admitting: Podiatry

## 2017-08-01 DIAGNOSIS — Q828 Other specified congenital malformations of skin: Secondary | ICD-10-CM

## 2017-08-01 DIAGNOSIS — E1142 Type 2 diabetes mellitus with diabetic polyneuropathy: Secondary | ICD-10-CM | POA: Diagnosis not present

## 2017-08-01 DIAGNOSIS — B351 Tinea unguium: Secondary | ICD-10-CM | POA: Diagnosis not present

## 2017-08-01 DIAGNOSIS — M79676 Pain in unspecified toe(s): Secondary | ICD-10-CM

## 2017-08-01 NOTE — Progress Notes (Signed)
She presents today chief complaint of painful elongated toenails 1 through 5 bilateral.  Objective: Pulses are palpable bilateral.  Toenails are long thick yellow dystrophic-like mycotic no open lesions or wounds are noted.  Assessment: Pain in limb secondary to onychomycosis.  Plan: Debridement of toenails 1 through 5 bilateral follow-up with her in 3 months.

## 2017-08-09 ENCOUNTER — Other Ambulatory Visit: Payer: Self-pay | Admitting: Family Medicine

## 2017-08-24 ENCOUNTER — Encounter: Payer: Self-pay | Admitting: Family Medicine

## 2017-08-24 ENCOUNTER — Ambulatory Visit (INDEPENDENT_AMBULATORY_CARE_PROVIDER_SITE_OTHER): Payer: Medicare Other | Admitting: Family Medicine

## 2017-08-24 VITALS — BP 128/72 | HR 98 | Temp 98.4°F | Resp 16 | Ht 60.0 in | Wt 174.0 lb

## 2017-08-24 DIAGNOSIS — I481 Persistent atrial fibrillation: Secondary | ICD-10-CM | POA: Diagnosis not present

## 2017-08-24 DIAGNOSIS — I6381 Other cerebral infarction due to occlusion or stenosis of small artery: Secondary | ICD-10-CM

## 2017-08-24 DIAGNOSIS — I4819 Other persistent atrial fibrillation: Secondary | ICD-10-CM

## 2017-08-24 MED ORDER — ATENOLOL 50 MG PO TABS: 50.0000 mg | ORAL_TABLET | Freq: Every day | ORAL | 3 refills | Status: DC

## 2017-08-24 NOTE — Progress Notes (Signed)
Subjective:    Patient ID: [name], female    DOB: 1934-01-18, 84 y.o.   MRN: [id number]  HPI Unfortunately was recently admitted to the hospital.  I have copied relevant portions of the DC summary below for reference:  Admit date: 07/03/2017 Discharge date: 07/05/2017  Time spent: >76mins, more than 50% time spent on coordination of care.  Recommendations for Outpatient Follow-up:  1. F/u with PMD within a week  for hospital discharge follow up, repeat cbc/bmp at follow up 2. Home health arranged  Discharge Diagnoses:      Active Hospital Problems   Diagnosis Date Noted  . Fall at home, initial encounter 07/03/2017  . UTI (urinary tract infection) 07/03/2017  . A-fib ([place]) 07/03/2017  . Chronic pain syndrome 02/02/2017  . [name] (abdominal aortic aneurysm) ([place]) 09/30/2013  . Essential hypertension 03/11/2006    Resolved Hospital Problems  No resolved problems to display.    Discharge Condition: stable  Diet recommendation: heart healthy      Filed Weights   07/02/17 2353 07/03/17 1853  Weight: 75.8 kg (167 lb) 76.7 kg (169 lb 3.2 oz)    History of present illness: (per admitting [name] Dr Tamala Julian) [name], [name], [name] Patient coming from:Home  Chief Complaint:Fall  I have personally briefly reviewed patient's old medical records in [facility]   Yvonne L Hicksis a 70 y.o.femalewith medical history significant ofHTN, [name], chronic pain,[name],prediabetes, arthritis,[name];[name] Patient reports not knowing what caused her to fall. Denies any loss of consciousness, lightheadedness, chest pain, palpitations, or trauma to her head. She did strike the left side of her chest and complains of some tenderness there. Over the last day or so she had some generalized weakness.She had gone to her primary care provider for a cough and had been prescribed and  completed a course of azithromycin just a few days ago. Denies having any fever, chills, dysuria, abdominal pain, nausea, vomiting, diarrhea, or p.o. intake. Her son who lives at home with her heard her fall but did not witness it and came upstairs and helped her up. Patient makes note that she has heard that she has had any irregular heart rhythm during her previous hospitalization in October. She reports being on Plavix for history of TIAs.  ED Course:Upon admission into the emergency department patient was noted to be afebrile with relatively normal vital signsand negative orthostatics vital signs. Labs are unremarkable. X-rays of her hip and chest were negative for any acute abnormalities. CT scan brain showed no acute abnormalities. CT of the abdomen showed a 3.8 cm abdominal aortic aneurysm. Patient noted to be in atrial fibrillation on EKG.     Hospital Course:  Principal Problem:   Fall at home, initial encounter Active Problems:   Essential hypertension   [name] (abdominal aortic aneurysm) (HCC)   Chronic pain syndrome   UTI (urinary tract infection)   A-fib ([place])   Fall at home: -with left sided rib Pain, cxr no acute findings. -Acute. Patient reports not knowing the cause of her fall. -No focal deficit, no significant drop in orthostasis. CT head as well as MRI brain negative for any acute stroke. -tele did show afib but rate controlled, echo lvef wnl. Troponin negative -PT recommends home health.  Urinary tract infection:Acute. Patient found to have urinalysis with positive nitrites, small leukocytes, rare bacteria, and 6-30 WBCs.  Pan sensitive E. coli growing in  the urine. blood culture no growth. Patient was started on Rocephin IV on admission, she is discharged on keflex  Nephrolithiasis:Incidental finding on CT of the abdomen noting right nephrolithiasis without obstruction. With h/o recurrent uti, she is advised to follow up with urology on  outpatient basis.  Bronchitis: Patient was seen on 2/28 for cough at pcp office. On presentation, she has wheezing, cxr no acute findings, she is already on abx, she is treated with nebs and steroids, wheezing and cough resolved at discharge. She is discharged on abx and steroids taper.   Atrial fibrillation:Patient presents in atrial fibrillation appears rate controlled. CHADsVASc scoreequalsat least 6.Appears to be somewhat of a high falls risk. Has-bled score equals at least 3.she does has risk of falls.Patient seen previously go into atrial fibrillation transiently as a result of infection during her 01/2017 admission. -Normalon TSH -Echo cardiogram shows preserved EF, no significant valvular abnormality. -rate controlled on atenolol -Recommend to discuss with PCP/cardiology regarding anticoagulation as an outpatient.  Essential hypertension -home med hctz held on admission, resume at discharge, Continue atenolol   Chronic pain -Continue home medsCymbaltaand Opana  History of [name] -Continue Plavix /statin/atenolol   Hyperlipidemia -Continue Crestor  OSA:not on CPAP  [name] -Follow-up repeat ultrasound recommended in 2 years     07/24/17 Patient has a history of lacunar infarcts that have been treated with Plavix up until now.  However she is now in persistent atrial fibrillation.  She is still in atrial fibrillation this morning in her hospital follow-up.  She denies any chest pain shortness of breath or dyspnea on exertion.  She is unable to feel the change in her heart rhythm.  She denies any headaches.  She denies any neurologic deficits.  She denies any palpitations or syncope.  She does occasionally feel dizzy.  She is on Opana under the care of a pain specialist.  She does have a history of falls most recently that prompted her  hospitalization.  Today we spent more than 25 minutes discussing the diagnosis and implication of atrial fibrillation as well as treatment options and prognosis.  At that time, my plan was: I will recheck a urinalysis and urine culture to ensure resolution of her recent UTI.  Spent more than 25 minutes discussing atrial fibrillation with the patient and her son.  I explained that this puts her at extremely high risk for future stroke however changing her blood thinners could be dangerous given her propensity to fall.  Ultimately we decided to discontinue Plavix and replace it with eliquis 5 mg pobid.  She has an appointment with her cardiologist later this week and she would like to discuss this with them as well.  If they decide to stay on this medication, they will call me back and I will call out a prescription for the medication and removed Plavix from her medicine list.  This would be my recommendation.  However I did explain the risk of falls and I recommended discussing with her pain management specialist trying to wean down or change her chronic pain medication to reduce her risk of falls as much as possible.  08/24/17 Patient is here today for recheck.  Ultimately, she discontinued Plavix and has now switched to Eliquis 5 mg p.o. twice daily.  She fell yesterday and has a golf ball sized hematoma on the left iliac crest as well as a larger hematoma over  the greater trochanter on the left-hand side.  She has no pain in her right hip with ambulation.  She denies any head injury or headache.  Patient lost her balance going to the bathroom.  She was walking independently without using her walker when it happened.  Today on examination, her heart rate is elevated between 98 and 110 bpm while the patient is sitting at rest.  She denies any chest pain shortness of breath dizziness or palpitations. Past Medical History:  Diagnosis Date  . [name] (abdominal aortic aneurysm) ([place]) 09/2013   3.6 cm  . Dementia     . Diabetes mellitus    pre-diabetes  . Diverticulosis   . Hyperlipidemia   . Hypertension   . Nephrolithiasis   . OSA on CPAP   . Osteoarthritis   . Osteopenia    T=  -2.1 in hip  . Rheumatoid arthritis(714.0)   . S/P carpal tunnel release   . Spinal stenosis    Past Surgical History:  Procedure Laterality Date  . APPENDECTOMY    . CARPAL TUNNEL RELEASE  12/27/2006   Right subcutaneous ulnar nerve transfer -- Right open carpal tunnel release  . CHOLECYSTECTOMY    . FOOT SURGERY    . JOINT REPLACEMENT     BTKR, RTHR  . TOTAL KNEE ARTHROPLASTY  08/21/2007   Right total knee replacement   Current Outpatient Medications on File Prior to Visit  Medication Sig Dispense Refill  . apixaban (ELIQUIS) 5 MG TABS tablet Take 1 tablet (5 mg total) by mouth 2 (two) times daily. 60 tablet 3  . atenolol (TENORMIN) 25 MG tablet TAKE 1 TABLET(25 MG) BY MOUTH DAILY 90 tablet 3  . DULoxetine (CYMBALTA) 30 MG capsule Take 30 mg by mouth 3 (three) times daily.   2  . gabapentin (NEURONTIN) 800 MG tablet TAKE 1 TABLET BY MOUTH EVERY MORNING AND AT LUNCH, 2 TABLETS BY MOUTH EVERY NIGHT AT BEDTIME 360 tablet 4  . hydrochlorothiazide (HYDRODIURIL) 25 MG tablet TAKE 1 TABLET BY MOUTH EVERY DAY 90 tablet 3  . oxymorphone (OPANA) 5 MG tablet Take 5 mg by mouth 3 (three) times daily.    . rosuvastatin (CRESTOR) 40 MG tablet Take 1 tablet (40 mg total) by mouth daily. 90 tablet 3   No current facility-administered medications on file prior to visit.    Allergies  Allergen Reactions  . Sulfonamide Derivatives Hives and Rash   Social History   Socioeconomic History  . Marital status: Divorced    Spouse name: Not on file  . Number of children: 6  . Years of education: Not on file  . Highest education level: Not on file  Occupational History  . Not on file  Social Needs  . Financial resource strain: Not on file  . Food insecurity:    Worry: Not on file    Inability: Not on file  .  Transportation needs:    Medical: Not on file    Non-medical: Not on file  Tobacco Use  . Smoking status: Never Smoker  . Smokeless tobacco: Never Used  Substance and Sexual Activity  . Alcohol use: No  . Drug use: No  . Sexual activity: Not on file  Lifestyle  . Physical activity:    Days per week: Not on file    Minutes per session: Not on file  . Stress: Not on file  Relationships  . Social connections:    Talks on phone: Not on file    Gets  together: Not on file    Attends religious service: Not on file    Active member of club or organization: Not on file    Attends meetings of clubs or organizations: Not on file    Relationship status: Not on file  . Intimate partner violence:    Fear of current or ex partner: Not on file    Emotionally abused: Not on file    Physically abused: Not on file    Forced sexual activity: Not on file  Other Topics Concern  . Not on file  Social History Narrative  . Not on file      Review of Systems  All other systems reviewed and are negative.      Objective:   Physical Exam  Constitutional: She appears well-developed and well-nourished.  Neck: No JVD present. No thyromegaly present.  Cardiovascular: Normal rate and normal heart sounds. An irregularly irregular rhythm present.  Pulmonary/Chest: Effort normal and breath sounds normal. No respiratory distress. She has no wheezes. She has no rales.  Abdominal: Soft. Bowel sounds are normal. She exhibits no distension. There is no tenderness. There is no rebound and no guarding.  Musculoskeletal: She exhibits no edema.  Vitals reviewed.         Assessment & Plan:  Persistent atrial fibrillation (HCC)  I would like to increase her atenolol to 50 mg a day to try to better control her heart rate with a goal heart rate between 70 and 80 bpm.  To avoid hypotension, I will discontinue hydrochlorothiazide.  Continue Eliquis at the present time.  If the patient continues to experience  frequent falls, we will need to discontinue Eliquis due to the risk of intracranial hemorrhage.  I had a long discussion with the patient and her son regarding this.  I have also recommended that they discuss with her pain management doctor slowly weaning down on her narcotic pain medication.  As I explained to the patient this is not the cause of her falls.  However, it can be a contributing factor given her age and frailty.  Patient is hesitant to wean down on her medication due to her chronic back pain

## 2017-09-04 ENCOUNTER — Inpatient Hospital Stay (HOSPITAL_COMMUNITY)
Admission: EM | Admit: 2017-09-04 | Discharge: 2017-09-08 | DRG: 543 | Disposition: A | Discharge status: Skilled Nursing Facility | Payer: Medicare Other | Attending: Internal Medicine | Admitting: Internal Medicine

## 2017-09-04 ENCOUNTER — Other Ambulatory Visit: Payer: Self-pay

## 2017-09-04 ENCOUNTER — Emergency Department (HOSPITAL_COMMUNITY): Payer: Medicare Other

## 2017-09-04 ENCOUNTER — Encounter (HOSPITAL_COMMUNITY): Payer: Self-pay | Admitting: Emergency Medicine

## 2017-09-04 DIAGNOSIS — M069 Rheumatoid arthritis, unspecified: Secondary | ICD-10-CM | POA: Diagnosis present

## 2017-09-04 DIAGNOSIS — K08109 Complete loss of teeth, unspecified cause, unspecified class: Secondary | ICD-10-CM | POA: Diagnosis present

## 2017-09-04 DIAGNOSIS — I4891 Unspecified atrial fibrillation: Secondary | ICD-10-CM | POA: Diagnosis present

## 2017-09-04 DIAGNOSIS — Y92009 Unspecified place in unspecified non-institutional (private) residence as the place of occurrence of the external cause: Secondary | ICD-10-CM

## 2017-09-04 DIAGNOSIS — S0990XA Unspecified injury of head, initial encounter: Secondary | ICD-10-CM | POA: Diagnosis not present

## 2017-09-04 DIAGNOSIS — E785 Hyperlipidemia, unspecified: Secondary | ICD-10-CM | POA: Diagnosis not present

## 2017-09-04 DIAGNOSIS — I10 Essential (primary) hypertension: Secondary | ICD-10-CM | POA: Diagnosis not present

## 2017-09-04 DIAGNOSIS — Z9049 Acquired absence of other specified parts of digestive tract: Secondary | ICD-10-CM

## 2017-09-04 DIAGNOSIS — Z9989 Dependence on other enabling machines and devices: Secondary | ICD-10-CM

## 2017-09-04 DIAGNOSIS — E669 Obesity, unspecified: Secondary | ICD-10-CM | POA: Diagnosis present

## 2017-09-04 DIAGNOSIS — Z833 Family history of diabetes mellitus: Secondary | ICD-10-CM

## 2017-09-04 DIAGNOSIS — F0390 Unspecified dementia without behavioral disturbance: Secondary | ICD-10-CM | POA: Diagnosis present

## 2017-09-04 DIAGNOSIS — Y9389 Activity, other specified: Secondary | ICD-10-CM

## 2017-09-04 DIAGNOSIS — G8929 Other chronic pain: Secondary | ICD-10-CM | POA: Diagnosis present

## 2017-09-04 DIAGNOSIS — G4733 Obstructive sleep apnea (adult) (pediatric): Secondary | ICD-10-CM | POA: Diagnosis present

## 2017-09-04 DIAGNOSIS — Z6829 Body mass index (BMI) 29.0-29.9, adult: Secondary | ICD-10-CM

## 2017-09-04 DIAGNOSIS — I481 Persistent atrial fibrillation: Secondary | ICD-10-CM | POA: Diagnosis not present

## 2017-09-04 DIAGNOSIS — R079 Chest pain, unspecified: Secondary | ICD-10-CM | POA: Diagnosis not present

## 2017-09-04 DIAGNOSIS — M5489 Other dorsalgia: Secondary | ICD-10-CM | POA: Diagnosis not present

## 2017-09-04 DIAGNOSIS — M549 Dorsalgia, unspecified: Secondary | ICD-10-CM | POA: Diagnosis not present

## 2017-09-04 DIAGNOSIS — S299XXA Unspecified injury of thorax, initial encounter: Secondary | ICD-10-CM | POA: Diagnosis not present

## 2017-09-04 DIAGNOSIS — R51 Headache: Secondary | ICD-10-CM | POA: Diagnosis not present

## 2017-09-04 DIAGNOSIS — S22080A Wedge compression fracture of T11-T12 vertebra, initial encounter for closed fracture: Secondary | ICD-10-CM

## 2017-09-04 DIAGNOSIS — Z87442 Personal history of urinary calculi: Secondary | ICD-10-CM

## 2017-09-04 DIAGNOSIS — M4854XA Collapsed vertebra, not elsewhere classified, thoracic region, initial encounter for fracture: Principal | ICD-10-CM | POA: Diagnosis present

## 2017-09-04 DIAGNOSIS — Z9181 History of falling: Secondary | ICD-10-CM

## 2017-09-04 DIAGNOSIS — Z96653 Presence of artificial knee joint, bilateral: Secondary | ICD-10-CM | POA: Diagnosis present

## 2017-09-04 DIAGNOSIS — Z79891 Long term (current) use of opiate analgesic: Secondary | ICD-10-CM

## 2017-09-04 DIAGNOSIS — W19XXXA Unspecified fall, initial encounter: Secondary | ICD-10-CM

## 2017-09-04 DIAGNOSIS — I714 Abdominal aortic aneurysm, without rupture: Secondary | ICD-10-CM | POA: Diagnosis not present

## 2017-09-04 DIAGNOSIS — Y92002 Bathroom of unspecified non-institutional (private) residence single-family (private) house as the place of occurrence of the external cause: Secondary | ICD-10-CM

## 2017-09-04 DIAGNOSIS — Z882 Allergy status to sulfonamides status: Secondary | ICD-10-CM

## 2017-09-04 DIAGNOSIS — Z8781 Personal history of (healed) traumatic fracture: Secondary | ICD-10-CM

## 2017-09-04 DIAGNOSIS — Z8249 Family history of ischemic heart disease and other diseases of the circulatory system: Secondary | ICD-10-CM

## 2017-09-04 DIAGNOSIS — W010XXA Fall on same level from slipping, tripping and stumbling without subsequent striking against object, initial encounter: Secondary | ICD-10-CM | POA: Diagnosis present

## 2017-09-04 DIAGNOSIS — R300 Dysuria: Secondary | ICD-10-CM | POA: Diagnosis not present

## 2017-09-04 DIAGNOSIS — R296 Repeated falls: Secondary | ICD-10-CM | POA: Diagnosis present

## 2017-09-04 DIAGNOSIS — Z823 Family history of stroke: Secondary | ICD-10-CM

## 2017-09-04 DIAGNOSIS — M545 Low back pain: Secondary | ICD-10-CM | POA: Diagnosis not present

## 2017-09-04 DIAGNOSIS — Z79899 Other long term (current) drug therapy: Secondary | ICD-10-CM

## 2017-09-04 DIAGNOSIS — Z7901 Long term (current) use of anticoagulants: Secondary | ICD-10-CM

## 2017-09-04 HISTORY — DX: Unspecified atrial fibrillation: I48.91

## 2017-09-04 LAB — CBC WITH DIFFERENTIAL/PLATELET
Basophils Absolute: 0.1 10*3/uL (ref 0.0–0.1)
Basophils Relative: 1 %
Eosinophils Absolute: 0.2 10*3/uL (ref 0.0–0.7)
Eosinophils Relative: 2 %
HCT: 41.2 % (ref 36.0–46.0)
Hemoglobin: 13 g/dL (ref 12.0–15.0)
Lymphocytes Relative: 30 %
Lymphs Abs: 2.4 10*3/uL (ref 0.7–4.0)
MCH: 29.9 pg (ref 26.0–34.0)
MCHC: 31.6 g/dL (ref 30.0–36.0)
MCV: 94.7 fL (ref 78.0–100.0)
Monocytes Absolute: 0.5 10*3/uL (ref 0.1–1.0)
Monocytes Relative: 6 %
Neutro Abs: 4.9 10*3/uL (ref 1.7–7.7)
Neutrophils Relative %: 61 %
Platelets: 155 10*3/uL (ref 150–400)
RBC: 4.35 MIL/uL (ref 3.87–5.11)
RDW: 14.4 % (ref 11.5–15.5)
WBC: 8 10*3/uL (ref 4.0–10.5)

## 2017-09-04 LAB — BASIC METABOLIC PANEL
Anion gap: 12 (ref 5–15)
BUN: 20 mg/dL (ref 6–20)
CO2: 26 mmol/L (ref 22–32)
Calcium: 9.2 mg/dL (ref 8.9–10.3)
Chloride: 103 mmol/L (ref 101–111)
Creatinine, Ser: 0.89 mg/dL (ref 0.44–1.00)
GFR calc Af Amer: >60 mL/min (ref >60)
GFR calc non Af Amer: 59 mL/min — ABNORMAL LOW (ref >60)
Glucose, Bld: 111 mg/dL — ABNORMAL HIGH (ref 65–99)
Potassium: 4.2 mmol/L (ref 3.5–5.1)
Sodium: 141 mmol/L (ref 135–145)

## 2017-09-04 MED ORDER — ROSUVASTATIN CALCIUM 20 MG PO TABS
40.0000 mg | ORAL_TABLET | Freq: Every day | ORAL | Status: DC
  Administered 2017-09-04 – 2017-09-07 (×4): 40 mg via ORAL
  Filled 2017-09-04 (×2): qty 2
  Filled 2017-09-04: qty 1
  Filled 2017-09-04 (×2): qty 2

## 2017-09-04 MED ORDER — MORPHINE SULFATE 15 MG PO TABS
15.0000 mg | ORAL_TABLET | Freq: Three times a day (TID) | ORAL | Status: DC
  Administered 2017-09-04 – 2017-09-08 (×11): 15 mg via ORAL
  Filled 2017-09-04 (×11): qty 1

## 2017-09-04 MED ORDER — HYDROCHLOROTHIAZIDE 25 MG PO TABS: 25.0000 mg | ORAL_TABLET | Freq: Every day | ORAL | Status: DC

## 2017-09-04 MED ORDER — OXYMORPHONE HCL 5 MG PO TABS: 5.0000 mg | ORAL_TABLET | Freq: Three times a day (TID) | ORAL | Status: DC

## 2017-09-04 MED ORDER — HYDROCODONE-ACETAMINOPHEN 5-325 MG PO TABS
1.0000 | ORAL_TABLET | ORAL | Status: DC | PRN
  Administered 2017-09-05 (×2): 1 via ORAL
  Administered 2017-09-05: 2 via ORAL
  Administered 2017-09-06: 1 via ORAL
  Administered 2017-09-06 – 2017-09-08 (×6): 2 via ORAL
  Filled 2017-09-04 (×2): qty 2
  Filled 2017-09-04 (×2): qty 1
  Filled 2017-09-04 (×3): qty 2
  Filled 2017-09-04: qty 1
  Filled 2017-09-04 (×2): qty 2

## 2017-09-04 MED ORDER — SODIUM CHLORIDE 0.9 % IJ SOLN
INTRAMUSCULAR | Status: AC
  Filled 2017-09-04: qty 50

## 2017-09-04 MED ORDER — IOPAMIDOL (ISOVUE-370) INJECTION 76%
INTRAVENOUS | Status: AC
  Filled 2017-09-04: qty 100

## 2017-09-04 MED ORDER — IOPAMIDOL (ISOVUE-370) INJECTION 76%
100.0000 mL | Freq: Once | INTRAVENOUS | Status: AC | PRN
  Administered 2017-09-04: 100 mL via INTRAVENOUS

## 2017-09-04 MED ORDER — FENTANYL CITRATE (PF) 100 MCG/2ML IJ SOLN
50.0000 ug | Freq: Once | INTRAMUSCULAR | Status: AC
  Administered 2017-09-04: 50 ug via INTRAVENOUS
  Filled 2017-09-04: qty 2

## 2017-09-04 MED ORDER — DULOXETINE HCL 30 MG PO CPEP
30.0000 mg | ORAL_CAPSULE | Freq: Three times a day (TID) | ORAL | Status: DC
  Administered 2017-09-04 – 2017-09-08 (×11): 30 mg via ORAL
  Filled 2017-09-04 (×11): qty 1

## 2017-09-04 MED ORDER — MORPHINE SULFATE (PF) 2 MG/ML IV SOLN
2.0000 mg | INTRAVENOUS | Status: DC | PRN
  Administered 2017-09-04 – 2017-09-06 (×5): 2 mg via INTRAVENOUS
  Administered 2017-09-06 (×2): 1 mg via INTRAVENOUS
  Filled 2017-09-04 (×6): qty 1

## 2017-09-04 MED ORDER — ATENOLOL 50 MG PO TABS
50.0000 mg | ORAL_TABLET | Freq: Every day | ORAL | Status: DC
  Administered 2017-09-04 – 2017-09-08 (×5): 50 mg via ORAL
  Filled 2017-09-04 (×5): qty 1

## 2017-09-04 MED ORDER — MORPHINE SULFATE (PF) 4 MG/ML IV SOLN
4.0000 mg | Freq: Once | INTRAVENOUS | Status: AC
  Administered 2017-09-04: 4 mg via INTRAVENOUS
  Filled 2017-09-04: qty 1

## 2017-09-04 NOTE — ED Triage Notes (Addendum)
Per [facility] pt from home for fall getting in shower and hitting her back. Pt is on Eliquis.  Vitals: 146/90, 110HR, 96%

## 2017-09-04 NOTE — ED Notes (Signed)
Bed: WA22 Expected date:  Expected time:  Means of arrival:  Comments: 

## 2017-09-04 NOTE — ED Notes (Signed)
Bed: [facility] Expected date: 09/04/17 Expected time: 7:01 AM Means of arrival: Ambulance Comments: Fall

## 2017-09-04 NOTE — ED Notes (Signed)
ED TO INPATIENT HANDOFF REPORT  Name/Age/Gender [name] 84 y.o. female  Code Status    Code Status Orders  (From admission, onward)        Start     Ordered   09/04/17 1558  Full code  Continuous     09/04/17 1601    Code Status History    Date Active Date Inactive Code Status Order ID Comments User Context   07/03/2017 0438 07/05/2017 2017 Full Code [id number]  Norval Morton, MD ED   02/02/2017 0159 02/05/2017 1953 Full Code [id number]  Etta Quill, DO ED      Home/SNF/Other Home  Chief Complaint fall;back pain  Level of Care/Admitting Diagnosis ED Disposition    ED Disposition Condition [place]: [facility] [[id number]]  Level of Care: Med-Surg [16]  Diagnosis: T12 compression fracture [facility]) [[id number]]  Admitting Physician: Cristal Ford [[id number]]  Attending Physician: Cristal Ford [phone]  PT Class (Do Not Modify): Observation [104]  PT Acc Code (Do Not Modify): Observation [10022]       Medical History Past Medical History:  Diagnosis Date  . [name] (abdominal aortic aneurysm) ([place]) 09/2013   3.6 cm  . Atrial fibrillation ([place])   . Dementia   . Diabetes mellitus    pre-diabetes  . Diverticulosis   . Hyperlipidemia   . Hypertension   . Nephrolithiasis   . OSA on CPAP   . Osteoarthritis   . Osteopenia    T=  -2.1 in hip  . Rheumatoid arthritis(714.0)   . S/P carpal tunnel release   . Spinal stenosis     Allergies Allergies  Allergen Reactions  . Sulfonamide Derivatives Hives and Rash    IV Location/Drains/Wounds Patient Lines/Drains/Airways Status   Active Line/Drains/Airways    Name:   Placement date:   Placement time:   Site:   Days:   Peripheral IV 09/04/17 Left Antecubital   09/04/17    0749    Antecubital   less than 1          Labs/Imaging Results for orders placed or performed during the hospital encounter of 09/04/17 (from the past 48 hour(s))  Basic metabolic panel      Status: Abnormal   Collection Time: 09/04/17  7:35 AM  Result Value Ref Range   Sodium 141 135 - 145 mmol/L   Potassium 4.2 3.5 - 5.1 mmol/L   Chloride 103 101 - 111 mmol/L   CO2 26 22 - 32 mmol/L   Glucose, Bld 111 (H) 65 - 99 mg/dL   BUN 20 6 - 20 mg/dL   Creatinine, Ser 0.89 0.44 - 1.00 mg/dL   Calcium 9.2 8.9 - 10.3 mg/dL   GFR calc non Af Amer 59 (L) >60 mL/min   GFR calc Af Amer >60 >60 mL/min    Comment: (NOTE) The eGFR has been calculated using the CKD EPI equation. This calculation has not been validated in all clinical situations. eGFR's persistently <60 mL/min signify possible Chronic Kidney Disease.    Anion gap 12 5 - 15    Comment: Performed at [facility], [place] [address], [place], [place] [address]  CBC with Differential     Status: None   Collection Time: 09/04/17  7:35 AM  Result Value Ref Range   WBC 8.0 4.0 - 10.5 K/uL   RBC 4.35 3.87 - 5.11 MIL/uL   Hemoglobin 13.0 12.0 - 15.0 g/dL   HCT 41.2 36.0 -  46.0 %   MCV 94.7 78.0 - 100.0 fL   MCH 29.9 26.0 - 34.0 pg   MCHC 31.6 30.0 - 36.0 g/dL   RDW 14.4 11.5 - 15.5 %   Platelets 155 150 - 400 K/uL   Neutrophils Relative % 61 %   Neutro Abs 4.9 1.7 - 7.7 K/uL   Lymphocytes Relative 30 %   Lymphs Abs 2.4 0.7 - 4.0 K/uL   Monocytes Relative 6 %   Monocytes Absolute 0.5 0.1 - 1.0 K/uL   Eosinophils Relative 2 %   Eosinophils Absolute 0.2 0.0 - 0.7 K/uL   Basophils Relative 1 %   Basophils Absolute 0.1 0.0 - 0.1 K/uL    Comment: Performed at [facility], [place] [address] [place], [place] [address]   Dg Ribs Bilateral W/chest  Result Date: 09/04/2017 CLINICAL DATA:  Recent fall with chest pain, initial encounter EXAM: BILATERAL RIBS AND CHEST - 4+ VIEW COMPARISON:  07/03/2017 FINDINGS: Cardiac shadow is mildly enlarged. Aortic calcifications are noted. There are old healed rib fractures identified involving the left sixth and seventh ribs anteriorly. No pneumothorax is  noted. No focal infiltrate is seen. Degenerative changes of the thoracic spine are seen. IMPRESSION: Old left rib fractures without acute abnormality. Electronically Signed   By: Inez Catalina M.D.   On: 09/04/2017 09:03   Dg Lumbar Spine Complete  Result Date: 09/04/2017 CLINICAL DATA:  Status post fall with back pain. EXAM: LUMBAR SPINE - COMPLETE 4+ VIEW COMPARISON:  Body CT 07/03/2017 FINDINGS: There is no evidence of lumbar spine fracture. Mild levoconvex scoliosis. Extensive multilevel osteoarthritic changes with disc space narrowing, discogenic endplate sclerosis, osteophyte formation. Associated posterior facet arthropathy. Probably degenerative in etiology 4 mm anterolisthesis of L4 on L5, 5 mm posterior listhesis of L3 on L4, and 5 mm posterior listhesis of L2 on L3. Heavy calcific atherosclerotic disease of the aorta. IMPRESSION: Advanced multilevel osteoarthritic changes of the lumbosacral spine. No evidence of acute fracture. Electronically Signed   By: Fidela Salisbury M.D.   On: 09/04/2017 09:02   Ct Head Wo Contrast  Result Date: 09/04/2017 CLINICAL DATA:  Recent fall with headaches, initial encounter EXAM: CT HEAD WITHOUT CONTRAST TECHNIQUE: Contiguous axial images were obtained from the base of the skull through the vertex without intravenous contrast. COMPARISON:  07/03/2017 FINDINGS: Brain: Atrophic changes and chronic white matter ischemic changes are seen. No findings to suggest acute hemorrhage, acute infarction or space-occupying mass lesion are seen. Vascular: No hyperdense vessel or unexpected calcification. Skull: Normal. Negative for fracture or focal lesion. Sinuses/Orbits: No acute finding. Other: None. IMPRESSION: Chronic changes without acute abnormality. Electronically Signed   By: Inez Catalina M.D.   On: 09/04/2017 08:38   Ct Chest W Contrast  Result Date: 09/04/2017 CLINICAL DATA:  Fall, on anticoagulation. EXAM: CT CHEST WITH CONTRAST TECHNIQUE: Multidetector CT imaging  of the chest was performed during intravenous contrast administration. CONTRAST:  133m ISOVUE-370 IOPAMIDOL (ISOVUE-370) INJECTION 76% COMPARISON:  None. FINDINGS: Cardiovascular: Atherosclerotic calcification of the arterial vasculature, including three-vessel involvement of the coronary arteries. Heart is enlarged. No pericardial effusion. Mediastinum/Nodes: Mediastinal lymph nodes are not enlarged by CT size criteria. No hilar or axillary adenopathy. Esophagus is grossly unremarkable. Lungs/Pleura: Minimal dependent atelectasis bilaterally. Lungs are otherwise clear. No pleural fluid. Airway is unremarkable. Upper Abdomen: Visualized portions of the liver, spleen and stomach are grossly unremarkable. Musculoskeletal: Degenerative changes in the spine. No worrisome lytic or sclerotic lesions. IMPRESSION: 1. No acute findings. 2. Aortic  atherosclerosis (ICD10-170.0). Three-vessel coronary artery calcification. Electronically Signed   By: Lorin Picket M.D.   On: 09/04/2017 10:37   Mr Thoracic Spine Wo Contrast  Result Date: 09/04/2017 CLINICAL DATA:  Fall with back pain EXAM: MRI THORACIC SPINE WITHOUT CONTRAST TECHNIQUE: Multiplanar, multisequence MR imaging of the thoracic spine was performed. No intravenous contrast was administered. COMPARISON:  Chest CT from earlier today FINDINGS: Alignment: Exaggerated thoracic kyphosis. There is mild lumbar levocurvature and thoracic dextrocurvature. Vertebrae: T12 body fracture with horizontal fracture plane and neighboring edema. No posterior element fracture is seen. Height loss is less than 25% and there is no retropulsion. No underlying lesion is seen. Cord:  Normal signal and morphology. Paraspinal and other soft tissues: No significant finding. Disc levels: C7-T1: Central disc protrusion contacting the ventral cord. Generalized disc narrowing and desiccation with small endplate spurs, expected for age. T10-11: Right paracentral disc protrusion without  compressive canal or foraminal stenosis. T12: isointense material within the left ventral canal without cord compression, not clearly extending to a disc space. L1-2 advanced disc narrowing with retrolisthesis. IMPRESSION: T12 acute body fracture with mild height loss and no retropulsion. Left ventral canal isointensity, favor noncompressive hemorrhage rather than disc extrusion. Electronically Signed   By: Monte Fantasia M.D.   On: 09/04/2017 14:56   Ct Angio Abd/pel W And/or Wo Contrast  Result Date: 09/04/2017 CLINICAL DATA:  84 year old with an abdominal aortic aneurysm. Fall and hit back. Evaluate for retroperitoneal bleed. EXAM: CT ANGIOGRAPHY ABDOMEN AND PELVIS WITH CONTRAST AND WITHOUT CONTRAST TECHNIQUE: Multidetector CT imaging of the abdomen and pelvis was performed using the standard protocol during bolus administration of intravenous contrast. Multiplanar reconstructed images and MIPs were obtained and reviewed to evaluate the vascular anatomy. CONTRAST:  19m ISOVUE-370 IOPAMIDOL (ISOVUE-370) INJECTION 76% COMPARISON:  CT of the abdomen and pelvis 07/03/2017 FINDINGS: VASCULAR Aorta: Diffuse atherosclerotic disease involving the descending thoracic aorta and the abdominal aorta. The abdominal aorta is tortuous and difficult to accurately measure the size. Maximum dimension of the infrarenal abdominal aorta is 4.0 cm and this is similar to the prior examination. No evidence for an aortic dissection. No evidence for an aortic rupture or retroperitoneal hemorrhage. Celiac: Stenosis at the origin. This appears to be moderate to severe narrowing that could be hemodynamically significant. Main branch vessels of the celiac artery are patent. SMA: Origin of the SMA is heavily calcified and suggestive for severe narrowing. Extensive calcifications and atherosclerotic disease along the SMA. SMA is patent. Renals: Severe atherosclerotic disease involving the origin the bilateral renal arteries and suspect  bilateral renal artery stenosis. IMA: Calcifications at the origin of the IMA. The IMA is patent. There appears to be prominent collateral flow between the middle colic artery and the IMA. Inflow: Origin of the right common iliac artery is 1.8 cm with atherosclerotic disease. Right internal and right external iliac arteries are patent without significant stenosis. Left common, internal and external iliac arteries are patent without significant stenosis. Areas of ectasia in the left internal iliac artery. Proximal Outflow: Proximal femoral arteries are patent bilaterally. Veins: No obvious venous abnormality within the limitations of this arterial phase study. Review of the MIP images confirms the above findings. NON-VASCULAR Hepatobiliary: Gallbladder has been removed. Normal appearance of the liver. Common bile duct is prominent measuring 1.2 cm and similar to the previous examination. Pancreas: Normal appearance of the pancreas without inflammation or duct dilatation. Spleen: Normal in size without focal abnormality. Adrenals/Urinary Tract: Normal adrenal glands. Nonobstructive 0.7 cm stone  in the right kidney lower pole. There is a cyst in the right kidney lower pole measuring roughly 2.7 cm. No hydronephrosis. Urinary bladder is unremarkable. Stomach/Bowel: Colonic diverticulosis without acute inflammation. No evidence for bowel dilatation or obstruction. Stomach is unremarkable. Lymphatic: No significant lymph node enlargement in the abdomen or pelvis. Reproductive: Status post hysterectomy. No adnexal masses Other: No evidence for free fluid. Specifically, no evidence for a retroperitoneal hemorrhage. There is a small periumbilical hernia containing fat. Musculoskeletal: New fracture involving the T12 vertebral body. Fracture of the T12 inferior endplate and there may be minimal bone retropulsion at this level. T12 pedicles appear to be intact. Stable retrolisthesis at L1-L2 with disc space narrowing. Stable  anterolisthesis L4-L5 with extensive facet arthropathy. IMPRESSION: VASCULAR Stable 4.0 cm aneurysm of the abdominal aorta. No evidence for an aortic rupture or retroperitoneal hemorrhage. Recommend followup by ultrasound in 1 year. This recommendation follows ACR consensus guidelines: White Paper of the ACR Incidental Findings Committee II on Vascular Findings. J Am Coll Radiol 2013; 10:789-794. Severe atherosclerotic disease involving the abdominal aorta and the visceral arteries. Stenosis involving multiple visceral arteries as described. Patient is at risk for chronic mesenteric ischemia. NON-VASCULAR New fracture of the T12 vertebral body. Inferior endplate of M38 is mildly displaced. Minimal bone retropulsion at this level. Nonobstructive right kidney stone. These results were called by telephone at the time of interpretation on 09/04/2017 at 11:28 am to [facility], who verbally acknowledged these results. Electronically Signed   By: Markus Daft M.D.   On: 09/04/2017 11:22    Pending Labs Unresulted Labs (From admission, onward)   Start     Ordered   09/05/17 0500  Comprehensive metabolic panel  Tomorrow morning,   R     09/04/17 1601   09/05/17 0500  CBC  Tomorrow morning,   R     09/04/17 1601   09/04/17 0735  Urinalysis, Routine w reflex microscopic  STAT,   STAT     09/04/17 0739      Vitals/Pain Today's Vitals   09/04/17 1330 09/04/17 1537 09/04/17 1628 09/04/17 1700  BP: 138/83 119/80  126/86  Pulse: 98 97  100  Resp: 16 (!) 26  16  Temp:      TempSrc:      SpO2: 91% 98%  96%  PainSc:   9      Isolation Precautions No active isolations  Medications Medications  sodium chloride 0.9 % injection (has no administration in time range)  iopamidol (ISOVUE-370) 76 % injection (has no administration in time range)  atenolol (TENORMIN) tablet 50 mg (has no administration in time range)  DULoxetine (CYMBALTA) DR capsule 30 mg (has no administration in time range)   oxymorphone (OPANA) tablet 5 mg (has no administration in time range)  rosuvastatin (CRESTOR) tablet 40 mg (has no administration in time range)  HYDROcodone-acetaminophen (NORCO/VICODIN) 5-325 MG per tablet 1-2 tablet (has no administration in time range)  morphine 2 MG/ML injection 2 mg (has no administration in time range)  fentaNYL (SUBLIMAZE) injection 50 mcg (50 mcg Intravenous Given 09/04/17 0750)  morphine 4 MG/ML injection 4 mg (4 mg Intravenous Given 09/04/17 1031)  iopamidol (ISOVUE-370) 76 % injection 100 mL (100 mLs Intravenous Contrast Given 09/04/17 1004)  morphine 4 MG/ML injection 4 mg (4 mg Intravenous Given 09/04/17 1628)    Mobility non-ambulatory

## 2017-09-04 NOTE — H&P (Signed)
[facility] History and Physical  [name] NWG:[id number] DOB: 10/11/1933 DOA: 09/04/2017  PCP: Donita Brooks, MD  Patient coming from: home  Chief Complaint: Back pain  HPI: [name] is a 84 y.o. female with a medical history of atrial fibrillation on Eliquis, hypertension, chronic pain, who presented to the emergency department after sustaining a fall and having back pain.  Patient was attempting to move her shower seat into the tub at which point she fell backwards.  She denies hitting her head or losing consciousness.  She does report 10 out of 10 back pain.  She denies any dizziness or lightheadedness prior to the event.  Per daughters at bedside, patient has been falling more frequently lately.  Currently patient denies any chest pain, shortness of breath, abdominal pain, nausea or vomiting, diarrhea or constipation, changes in urinary frequency or pattern, dizziness or headache, change in vision, recent illness or sick contacts, recent travel.  ED Course: Found to have T12 compression fracture on CT as well as MRI of thoracic spine.  Neurosurgery consulted and recommended [facility] for admission to observe patient.  TR[facility] called for admission.  Review of Systems:  All other systems reviewed and are negative.   Past Medical History:  Diagnosis Date  . [name] (abdominal aortic aneurysm) (HCC) 09/2013   3.6 cm  . Atrial fibrillation (HCC)   . Dementia   . Diabetes mellitus    pre-diabetes  . Diverticulosis   . Hyperlipidemia   . Hypertension   . Nephrolithiasis   . OSA on CPAP   . Osteoarthritis   . Osteopenia    T=  -2.1 in hip  . Rheumatoid arthritis(714.0)   . S/P carpal tunnel release   . Spinal stenosis     Past Surgical History:  Procedure Laterality Date  . APPENDECTOMY    . CARPAL TUNNEL RELEASE  12/27/2006   Right subcutaneous ulnar nerve transfer -- Right open carpal tunnel release  . CHOLECYSTECTOMY    . FOOT SURGERY    . JOINT REPLACEMENT     BTKR,  RTHR  . TOTAL KNEE ARTHROPLASTY  08/21/2007   Right total knee replacement    Social History:  reports that she has never smoked. She has never used smokeless tobacco. She reports that she does not drink alcohol or use drugs.  Allergies  Allergen Reactions  . Sulfonamide Derivatives Hives and Rash    Family History  Problem Relation Age of Onset  . Stroke Father   . Coronary artery disease Son        CABG at age 57  . Heart disease Son   . Heart attack Son   . Stroke Mother   . Hypertension Sister   . Diabetes Brother   . Hypertension Maternal Aunt      Prior to Admission medications   Medication Sig Start Date End Date Taking? Authorizing Provider  apixaban (ELIQUIS) 5 MG TABS tablet Take 1 tablet (5 mg total) by mouth 2 (two) times daily. 07/26/17  Yes Jodelle Gross, NP  atenolol (TENORMIN) 50 MG tablet Take 1 tablet (50 mg total) by mouth daily. 08/24/17  Yes Donita Brooks, MD  DULoxetine (CYMBALTA) 30 MG capsule Take 30 mg by mouth 3 (three) times daily.  07/29/14  Yes [provider]  gabapentin (NEURONTIN) 800 MG tablet TAKE 1 TABLET BY MOUTH EVERY MORNING AND AT LUNCH, 2 TABLETS BY MOUTH EVERY NIGHT AT BEDTIME 10/05/16  Yes Hyatt, Max T, DPM  hydrochlorothiazide (  HYDRODIURIL) 25 MG tablet TAKE 1 TABLET BY MOUTH EVERY DAY 08/09/17  Yes Donita Brooks, MD  oxymorphone (OPANA) 5 MG tablet Take 5 mg by mouth 3 (three) times daily.   Yes [provider]  rosuvastatin (CRESTOR) 40 MG tablet Take 1 tablet (40 mg total) by mouth daily. 01/05/17  Yes Donita Brooks, MD    Physical Exam: Vitals:   09/04/17 1330 09/04/17 1537  BP: 138/83 119/80  Pulse: 98 97  Resp: 16 (!) 26  Temp:    SpO2: 91% 98%     General: Well developed, well nourished, NAD, appears stated age  HEENT: NCAT, PERRLA, EOMI, Anicteic Sclera, mucous membranes moist.  Edentulous  Neck: Supple, no JVD, no masses  Cardiovascular: S1 S2 auscultated, no rubs, murmurs or gallops.   Irregularly irregular.  Respiratory: Clear to auscultation bilaterally with equal chest rise  Abdomen: Soft, obese, nontender, nondistended, + bowel sounds  Extremities: warm dry without cyanosis clubbing or edema  Neuro: AAOx3, cranial nerves grossly intact.  Unable to test strength due to back pain.  Skin: Without rashes exudates or nodules  Psych: Normal affect and demeanor with intact judgement and insight  Labs on Admission: I have personally reviewed following labs and imaging studies CBC: Recent Labs  Lab 09/04/17 0735  WBC 8.0  NEUTROABS 4.9  HGB 13.0  HCT 41.2  MCV 94.7  PLT 155   Basic Metabolic Panel: Recent Labs  Lab 09/04/17 0735  NA 141  K 4.2  CL 103  CO2 26  GLUCOSE 111*  BUN 20  CREATININE 0.89  CALCIUM 9.2   GFR: Estimated Creatinine Clearance: 46.1 mL/min (by C-G formula based on SCr of 0.89 mg/dL). Liver Function Tests: No results for input(s): AST, ALT, ALKPHOS, BILITOT, PROT, ALBUMIN in the last 168 hours. No results for input(s): LIPASE, AMYLASE in the last 168 hours. No results for input(s): AMMONIA in the last 168 hours. Coagulation Profile: No results for input(s): INR, PROTIME in the last 168 hours. Cardiac Enzymes: No results for input(s): CKTOTAL, CKMB, CKMBINDEX, TROPONINI in the last 168 hours. BNP (last 3 results) No results for input(s): PROBNP in the last 8760 hours. HbA1C: No results for input(s): HGBA1C in the last 72 hours. CBG: No results for input(s): GLUCAP in the last 168 hours. Lipid Profile: No results for input(s): CHOL, HDL, LDLCALC, TRIG, CHOLHDL, LDLDIRECT in the last 72 hours. Thyroid Function Tests: No results for input(s): TSH, T4TOTAL, FREET4, T3FREE, THYROIDAB in the last 72 hours. Anemia Panel: No results for input(s): VITAMINB12, FOLATE, FERRITIN, TIBC, IRON, RETICCTPCT in the last 72 hours. Urine analysis:    Component Value Date/Time   COLORURINE YELLOW 07/24/2017 0958   APPEARANCEUR CLOUDY (A)  07/24/2017 0958   LABSPEC 1.025 07/24/2017 0958   PHURINE 6.0 07/24/2017 0958   GLUCOSEU NEGATIVE 07/24/2017 0958   GLUCOSEU NEG mg/dL 14/78/2956 2130   HGBUR NEGATIVE 07/24/2017 0958   HGBUR negative 03/06/2008 0846   BILIRUBINUR NEGATIVE 07/03/2017 0340   KETONESUR NEGATIVE 07/24/2017 0958   PROTEINUR NEGATIVE 07/24/2017 0958   UROBILINOGEN 0.2 09/20/2013 1535   NITRITE NEGATIVE 07/24/2017 0958   LEUKOCYTESUR TRACE (A) 07/24/2017 0958   Sepsis Labs: @LABRCNTIP (procalcitonin:4,lacticidven:4) )No results found for this or any previous visit (from the past 240 hour(s)).   Radiological Exams on Admission: Dg Ribs Bilateral W/chest  Result Date: 09/04/2017 CLINICAL DATA:  Recent fall with chest pain, initial encounter EXAM: BILATERAL RIBS AND CHEST - 4+ VIEW COMPARISON:  07/03/2017 FINDINGS: Cardiac shadow is  mildly enlarged. Aortic calcifications are noted. There are old healed rib fractures identified involving the left sixth and seventh ribs anteriorly. No pneumothorax is noted. No focal infiltrate is seen. Degenerative changes of the thoracic spine are seen. IMPRESSION: Old left rib fractures without acute abnormality. Electronically Signed   By: Alcide Clever M.D.   On: 09/04/2017 09:03   Dg Lumbar Spine Complete  Result Date: 09/04/2017 CLINICAL DATA:  Status post fall with back pain. EXAM: LUMBAR SPINE - COMPLETE 4+ VIEW COMPARISON:  Body CT 07/03/2017 FINDINGS: There is no evidence of lumbar spine fracture. Mild levoconvex scoliosis. Extensive multilevel osteoarthritic changes with disc space narrowing, discogenic endplate sclerosis, osteophyte formation. Associated posterior facet arthropathy. Probably degenerative in etiology 4 mm anterolisthesis of L4 on L5, 5 mm posterior listhesis of L3 on L4, and 5 mm posterior listhesis of L2 on L3. Heavy calcific atherosclerotic disease of the aorta. IMPRESSION: Advanced multilevel osteoarthritic changes of the lumbosacral spine. No evidence of  acute fracture. Electronically Signed   By: Ted Mcalpine M.D.   On: 09/04/2017 09:02   Ct Head Wo Contrast  Result Date: 09/04/2017 CLINICAL DATA:  Recent fall with headaches, initial encounter EXAM: CT HEAD WITHOUT CONTRAST TECHNIQUE: Contiguous axial images were obtained from the base of the skull through the vertex without intravenous contrast. COMPARISON:  07/03/2017 FINDINGS: Brain: Atrophic changes and chronic white matter ischemic changes are seen. No findings to suggest acute hemorrhage, acute infarction or space-occupying mass lesion are seen. Vascular: No hyperdense vessel or unexpected calcification. Skull: Normal. Negative for fracture or focal lesion. Sinuses/Orbits: No acute finding. Other: None. IMPRESSION: Chronic changes without acute abnormality. Electronically Signed   By: Alcide Clever M.D.   On: 09/04/2017 08:38   Ct Chest W Contrast  Result Date: 09/04/2017 CLINICAL DATA:  Fall, on anticoagulation. EXAM: CT CHEST WITH CONTRAST TECHNIQUE: Multidetector CT imaging of the chest was performed during intravenous contrast administration. CONTRAST:  ISOVUE-370 IOPAMIDOL (ISOVUE-370) INJECTION 76% COMPARISON:  None. FINDINGS: Cardiovascular: Atherosclerotic calcification of the arterial vasculature, including three-vessel involvement of the coronary arteries. Heart is enlarged. No pericardial effusion. Mediastinum/Nodes: Mediastinal lymph nodes are not enlarged by CT size criteria. No hilar or axillary adenopathy. Esophagus is grossly unremarkable. Lungs/Pleura: Minimal dependent atelectasis bilaterally. Lungs are otherwise clear. No pleural fluid. Airway is unremarkable. Upper Abdomen: Visualized portions of the liver, spleen and stomach are grossly unremarkable. Musculoskeletal: Degenerative changes in the spine. No worrisome lytic or sclerotic lesions. IMPRESSION: 1. No acute findings. 2. Aortic atherosclerosis (ICD10-170.0). Three-vessel coronary artery calcification.  Electronically Signed   By: Leanna Battles M.D.   On: 09/04/2017 10:37   Mr Thoracic Spine Wo Contrast  Result Date: 09/04/2017 CLINICAL DATA:  Fall with back pain EXAM: MRI THORACIC SPINE WITHOUT CONTRAST TECHNIQUE: Multiplanar, multisequence MR imaging of the thoracic spine was performed. No intravenous contrast was administered. COMPARISON:  Chest CT from earlier today FINDINGS: Alignment: Exaggerated thoracic kyphosis. There is mild lumbar levocurvature and thoracic dextrocurvature. Vertebrae: T12 body fracture with horizontal fracture plane and neighboring edema. No posterior element fracture is seen. Height loss is less than 25% and there is no retropulsion. No underlying lesion is seen. Cord:  Normal signal and morphology. Paraspinal and other soft tissues: No significant finding. Disc levels: C7-T1: Central disc protrusion contacting the ventral cord. Generalized disc narrowing and desiccation with small endplate spurs, expected for age. T10-11: Right paracentral disc protrusion without compressive canal or foraminal stenosis. T12: isointense material within the left ventral canal without cord  compression, not clearly extending to a disc space. L1-2 advanced disc narrowing with retrolisthesis. IMPRESSION: T12 acute body fracture with mild height loss and no retropulsion. Left ventral canal isointensity, favor noncompressive hemorrhage rather than disc extrusion. Electronically Signed   By: Marnee Spring M.D.   On: 09/04/2017 14:56   Ct Angio Abd/pel W And/or Wo Contrast  Result Date: 09/04/2017 CLINICAL DATA:  84 year old with an abdominal aortic aneurysm. Fall and hit back. Evaluate for retroperitoneal bleed. EXAM: CT ANGIOGRAPHY ABDOMEN AND PELVIS WITH CONTRAST AND WITHOUT CONTRAST TECHNIQUE: Multidetector CT imaging of the abdomen and pelvis was performed using the standard protocol during bolus administration of intravenous contrast. Multiplanar reconstructed images and MIPs were obtained and  reviewed to evaluate the vascular anatomy. CONTRAST:  ISOVUE-370 IOPAMIDOL (ISOVUE-370) INJECTION 76% COMPARISON:  CT of the abdomen and pelvis 07/03/2017 FINDINGS: VASCULAR Aorta: Diffuse atherosclerotic disease involving the descending thoracic aorta and the abdominal aorta. The abdominal aorta is tortuous and difficult to accurately measure the size. Maximum dimension of the infrarenal abdominal aorta is 4.0 cm and this is similar to the prior examination. No evidence for an aortic dissection. No evidence for an aortic rupture or retroperitoneal hemorrhage. Celiac: Stenosis at the origin. This appears to be moderate to severe narrowing that could be hemodynamically significant. Main branch vessels of the celiac artery are patent. SMA: Origin of the SMA is heavily calcified and suggestive for severe narrowing. Extensive calcifications and atherosclerotic disease along the SMA. SMA is patent. Renals: Severe atherosclerotic disease involving the origin the bilateral renal arteries and suspect bilateral renal artery stenosis. IMA: Calcifications at the origin of the IMA. The IMA is patent. There appears to be prominent collateral flow between the middle colic artery and the IMA. Inflow: Origin of the right common iliac artery is 1.8 cm with atherosclerotic disease. Right internal and right external iliac arteries are patent without significant stenosis. Left common, internal and external iliac arteries are patent without significant stenosis. Areas of ectasia in the left internal iliac artery. Proximal Outflow: Proximal femoral arteries are patent bilaterally. Veins: No obvious venous abnormality within the limitations of this arterial phase study. Review of the MIP images confirms the above findings. NON-VASCULAR Hepatobiliary: Gallbladder has been removed. Normal appearance of the liver. Common bile duct is prominent measuring 1.2 cm and similar to the previous examination. Pancreas: Normal appearance of the  pancreas without inflammation or duct dilatation. Spleen: Normal in size without focal abnormality. Adrenals/Urinary Tract: Normal adrenal glands. Nonobstructive 0.7 cm stone in the right kidney lower pole. There is a cyst in the right kidney lower pole measuring roughly 2.7 cm. No hydronephrosis. Urinary bladder is unremarkable. Stomach/Bowel: Colonic diverticulosis without acute inflammation. No evidence for bowel dilatation or obstruction. Stomach is unremarkable. Lymphatic: No significant lymph node enlargement in the abdomen or pelvis. Reproductive: Status post hysterectomy. No adnexal masses Other: No evidence for free fluid. Specifically, no evidence for a retroperitoneal hemorrhage. There is a small periumbilical hernia containing fat. Musculoskeletal: New fracture involving the T12 vertebral body. Fracture of the T12 inferior endplate and there may be minimal bone retropulsion at this level. T12 pedicles appear to be intact. Stable retrolisthesis at L1-L2 with disc space narrowing. Stable anterolisthesis L4-L5 with extensive facet arthropathy. IMPRESSION: VASCULAR Stable 4.0 cm aneurysm of the abdominal aorta. No evidence for an aortic rupture or retroperitoneal hemorrhage. Recommend followup by ultrasound in 1 year. This recommendation follows ACR consensus guidelines: White Paper of the ACR Incidental Findings Committee II on Vascular Findings.  J Am Coll Radiol 2013; 10:789-794. Severe atherosclerotic disease involving the abdominal aorta and the visceral arteries. Stenosis involving multiple visceral arteries as described. Patient is at risk for chronic mesenteric ischemia. NON-VASCULAR New fracture of the T12 vertebral body. Inferior endplate of T12 is mildly displaced. Minimal bone retropulsion at this level. Nonobstructive right kidney stone. These results were called by telephone at the time of interpretation on 09/04/2017 at 11:28 am to [facility], who verbally acknowledged these results.  Electronically Signed   By: Richarda Overlie M.D.   On: 09/04/2017 11:22    EKG: Independently reviewed.  Atrial fibrillation, rate 115  Assessment/Plan Active Problems:   T12 compression fracture (HCC)  Back pain secondary to T12 compression fracture -Secondary to fall -Lumbar spine Xray showed no acute fracture. -Rib Xray shows old left rib fractures without acute abnormality  -CTA abdomen and pelvis: T12 compression fracture, endplate mildly displaced -MRI thoracic spine: T12 acute body fracture, mild height loss and no retropulsion.  Left central canal iso-intensity, favored noncompressive hemorrhage -CT head showed no acute abnormality -EDP discussed with Dr. Dutch Quint, neurosurgery.  Recommended lumbar corset with thoracic extension.  Pain control, PT evaluation.  Outpatient follow-up -Given questionable noncompressive hemorrhage, will hold Eliquis and monitor CBC -PT, OT consulted -Patient also takes Opana at home chronically.  Will add on hydrocodone as well as morphine for pain control  Fall -As above, PT OT will be consulted  Atrial fibrillation, persistent -Patient on Eliquis, given multiple falls and current noncompressive hemorrhage noted on MRI, will hold Eliquis.  Have discussed with patient as well as daughters at bedside, that Eliquis may need to be held indefinitely however this may be discussed with patient's PCP once out of the hospital. -Upon review of patient's chart, her PCP does state an office note 08/24/2017 that Eliquis would need to be discontinued due to risk of intracranial hemorrhage if patient continues to experience falls. -Currently rate controlled -Continue atenolol  Essential hypertension -Continue atenolol -HCTZ was discontinued as per PCPs note on 08/24/2017  Hyperlipidemia -Continue statin  Chronic pain/arthritis -Follows with pain management physician and is currently taking Opana 5mg  3 times daily  DVT prophylaxis: SCDs  Code Status:  Full  Family Communication: Family at bedside.  Admission, patients condition and plan of care including tests being ordered have been discussed with the patient and family who indicate understanding and agree with the plan and Code Status.  Disposition Plan: To be determined  Consults called: Neurosurgery, Dr. Dutch Quint by EDP  Admission status: Observation  Time spent: 70 minutes  Lesette Frary D.O. [facility] Pager [phone]  If 7PM-7AM, please contact night-coverage [email or web address] Password [email or web address] 09/04/2017, 4:09 PM

## 2017-09-04 NOTE — ED Notes (Signed)
Bed: [facility] Expected date:  Expected time:  Means of arrival:  Comments: Hold for 22

## 2017-09-04 NOTE — ED Provider Notes (Signed)
[facility] DEPT Provider Note   CSN: [id number] Arrival date & time: 09/04/17  0709     History   Chief Complaint Chief Complaint  Patient presents with  . Fall  . Back Pain    HPI [name] is a 84 y.o. female history of atrial fibrillation anticoagulated on Eliquis, [name], hypertension, spinal stenosis who presents following fall getting in the tub at home.  Patient was using her walker and slipped.  She hit her low back on it.  She did not hit her head or lose consciousness.  She is back pain.  She reports she was reaching for something and "just fell."  She denies feeling dizzy or lightheaded prior.  She denies any neck pain.  She denies any nausea or tingling, saddle anesthesia, or bowel or bladder incontinence.  HPI  Past Medical History:  Diagnosis Date  . [name] (abdominal aortic aneurysm) ([place]) 09/2013   3.6 cm  . Atrial fibrillation ([place])   . Dementia   . Diabetes mellitus    pre-diabetes  . Diverticulosis   . Hyperlipidemia   . Hypertension   . Nephrolithiasis   . OSA on CPAP   . Osteoarthritis   . Osteopenia    T=  -2.1 in hip  . Rheumatoid arthritis(714.0)   . S/P carpal tunnel release   . Spinal stenosis     Patient Active Problem List   Diagnosis Date Noted  . T12 compression fracture ([place]) 09/04/2017  . UTI (urinary tract infection) 07/03/2017  . Fall at home, initial encounter 07/03/2017  . A-fib ([place]) 07/03/2017  . Tachycardia 02/03/2017  . Sepsis secondary to UTI ([place]) 02/02/2017  . Chronic pain syndrome 02/02/2017  . Abnormal EKG 11/21/2013  . [name] (abdominal aortic aneurysm) ([place]) 09/30/2013  . OSA (obstructive sleep apnea) 06/29/2012  . Dementia 06/29/2012  . Pre-diabetes 06/29/2012  . Hypoxia, sleep related 06/29/2012  . RA (rheumatoid arthritis) ([place]) 06/29/2012  . Nonspecific abnormal results of cardiovascular function study 06/02/2011  . Nonspecific abnormal electrocardiogram (ECG) (EKG) 05/18/2011  .  OSTEOPOROSIS 04/09/2008  . FLANK PAIN, RIGHT 03/04/2008  . Hyperlipidemia 05/12/2006  . PYELONEPHRITIS, ACUTE 05/12/2006  . OSTEOARTHRITIS 05/12/2006  . DEPENDENT EDEMA, LEGS, BILATERAL 05/12/2006  . URINARY INCONTINENCE 05/12/2006  . PERIPHERAL NEUROPATHY 03/11/2006  . Essential hypertension 03/11/2006  . DYSURIA 03/11/2006    Past Surgical History:  Procedure Laterality Date  . APPENDECTOMY    . CARPAL TUNNEL RELEASE  12/27/2006   Right subcutaneous ulnar nerve transfer -- Right open carpal tunnel release  . CHOLECYSTECTOMY    . FOOT SURGERY    . JOINT REPLACEMENT     BTKR, RTHR  . TOTAL KNEE ARTHROPLASTY  08/21/2007   Right total knee replacement     OB History   None      Home Medications    Prior to Admission medications   Medication Sig Start Date End Date Taking? Authorizing Provider  apixaban (ELIQUIS) 5 MG TABS tablet Take 1 tablet (5 mg total) by mouth 2 (two) times daily. 07/26/17  Yes Lendon Colonel, NP  atenolol (TENORMIN) 50 MG tablet Take 1 tablet (50 mg total) by mouth daily. 08/24/17  Yes [name], MD  DULoxetine (CYMBALTA) 30 MG capsule Take 30 mg by mouth 3 (three) times daily.  07/29/14  Yes [provider]  gabapentin (NEURONTIN) 800 MG tablet TAKE 1 TABLET BY MOUTH EVERY MORNING AND AT LUNCH, 2 TABLETS BY MOUTH EVERY NIGHT AT BEDTIME 10/05/16  Yes Hyatt, Max T, DPM  hydrochlorothiazide (HYDRODIURIL) 25 MG tablet TAKE 1 TABLET BY MOUTH EVERY DAY 08/09/17  Yes [name], MD  oxymorphone (OPANA) 5 MG tablet Take 5 mg by mouth 3 (three) times daily.   Yes [provider]  rosuvastatin (CRESTOR) 40 MG tablet Take 1 tablet (40 mg total) by mouth daily. 01/05/17  Yes [name], MD    Family History Family History  Problem Relation Age of Onset  . Stroke Father   . Coronary artery disease Son        CABG at age 58  . Heart disease Son   . Heart attack Son   . Stroke Mother   . Hypertension Sister   . Diabetes  Brother   . Hypertension Maternal Aunt     Social History Social History   Tobacco Use  . Smoking status: Never Smoker  . Smokeless tobacco: Never Used  Substance Use Topics  . Alcohol use: No  . Drug use: No     Allergies   Sulfonamide derivatives   Review of Systems Review of Systems  Constitutional: Negative for chills and fever.  HENT: Negative for facial swelling and sore throat.   Respiratory: Negative for shortness of breath.   Cardiovascular: Negative for chest pain.  Gastrointestinal: Negative for abdominal pain, nausea and vomiting.  Genitourinary: Negative for dysuria.  Musculoskeletal: Positive for back pain. Negative for neck pain.  Skin: Negative for rash and wound.  Neurological: Negative for syncope and headaches.  Psychiatric/Behavioral: The patient is not nervous/anxious.      Physical Exam Updated Vital Signs BP 119/80 (BP Location: Right Arm)   Pulse 97   Temp 98.5 F (36.9 C) (Oral)   Resp (!) 26   SpO2 98%   Physical Exam  Constitutional: She appears well-developed and well-nourished. No distress.  HENT:  Head: Normocephalic and atraumatic.  Mouth/Throat: Oropharynx is clear and moist. No oropharyngeal exudate.  Eyes: Pupils are equal, round, and reactive to light. Conjunctivae are normal. Right eye exhibits no discharge. Left eye exhibits no discharge. No scleral icterus.  Neck: Normal range of motion. Neck supple. No thyromegaly present.  Cardiovascular: Normal rate, regular rhythm, normal heart sounds and intact distal pulses. Exam reveals no gallop and no friction rub.  No murmur heard. Pulmonary/Chest: Effort normal and breath sounds normal. No stridor. No respiratory distress. She has no wheezes. She has no rales.  Abdominal: Soft. Bowel sounds are normal. She exhibits no distension. There is no tenderness. There is no rebound and no guarding.  Musculoskeletal: She exhibits no edema.  Midline lumbar tenderness with right sided area  of edema L1-L2; no midline tenderness to the cervical or thoracic center  Lymphadenopathy:    She has no cervical adenopathy.  Neurological: She is alert. Coordination normal.  Skin: Skin is warm and dry. No rash noted. She is not diaphoretic. No pallor.  Psychiatric: She has a normal mood and affect.  Nursing note and vitals reviewed.    ED Treatments / Results  Labs (all labs ordered are listed, but only abnormal results are displayed) Labs Reviewed  BASIC METABOLIC PANEL - Abnormal; Notable for the following components:      Result Value   Glucose, Bld 111 (*)    GFR calc non Af Amer 59 (*)    All other components within normal limits  CBC WITH DIFFERENTIAL/PLATELET  URINALYSIS, ROUTINE W REFLEX MICROSCOPIC    EKG EKG Interpretation  Date/Time:  Monday Sep 04 2017 07:29:56 EDT Ventricular Rate:  115 PR Interval:    QRS Duration: 96 QT Interval:  335 QTC Calculation: 464 R Axis:   59 Text Interpretation:  Atrial fibrillation Low voltage, extremity and precordial leads No significant change since last tracing Confirmed by Duffy Bruce [phone]) on 09/04/2017 7:45:28 AM Also confirmed by Duffy Bruce [phone]), editor Philomena Doheny [phone])  on 09/04/2017 8:04:08 AM   Radiology Dg Ribs Bilateral W/chest  Result Date: 09/04/2017 CLINICAL DATA:  Recent fall with chest pain, initial encounter EXAM: BILATERAL RIBS AND CHEST - 4+ VIEW COMPARISON:  07/03/2017 FINDINGS: Cardiac shadow is mildly enlarged. Aortic calcifications are noted. There are old healed rib fractures identified involving the left sixth and seventh ribs anteriorly. No pneumothorax is noted. No focal infiltrate is seen. Degenerative changes of the thoracic spine are seen. IMPRESSION: Old left rib fractures without acute abnormality. Electronically Signed   By: Inez Catalina M.D.   On: 09/04/2017 09:03   Dg Lumbar Spine Complete  Result Date: 09/04/2017 CLINICAL DATA:  Status post fall with back pain. EXAM: LUMBAR  SPINE - COMPLETE 4+ VIEW COMPARISON:  Body CT 07/03/2017 FINDINGS: There is no evidence of lumbar spine fracture. Mild levoconvex scoliosis. Extensive multilevel osteoarthritic changes with disc space narrowing, discogenic endplate sclerosis, osteophyte formation. Associated posterior facet arthropathy. Probably degenerative in etiology 4 mm anterolisthesis of L4 on L5, 5 mm posterior listhesis of L3 on L4, and 5 mm posterior listhesis of L2 on L3. Heavy calcific atherosclerotic disease of the aorta. IMPRESSION: Advanced multilevel osteoarthritic changes of the lumbosacral spine. No evidence of acute fracture. Electronically Signed   By: Fidela Salisbury M.D.   On: 09/04/2017 09:02   Ct Head Wo Contrast  Result Date: 09/04/2017 CLINICAL DATA:  Recent fall with headaches, initial encounter EXAM: CT HEAD WITHOUT CONTRAST TECHNIQUE: Contiguous axial images were obtained from the base of the skull through the vertex without intravenous contrast. COMPARISON:  07/03/2017 FINDINGS: Brain: Atrophic changes and chronic white matter ischemic changes are seen. No findings to suggest acute hemorrhage, acute infarction or space-occupying mass lesion are seen. Vascular: No hyperdense vessel or unexpected calcification. Skull: Normal. Negative for fracture or focal lesion. Sinuses/Orbits: No acute finding. Other: None. IMPRESSION: Chronic changes without acute abnormality. Electronically Signed   By: Inez Catalina M.D.   On: 09/04/2017 08:38   Ct Chest W Contrast  Result Date: 09/04/2017 CLINICAL DATA:  Fall, on anticoagulation. EXAM: CT CHEST WITH CONTRAST TECHNIQUE: Multidetector CT imaging of the chest was performed during intravenous contrast administration. CONTRAST:  172mL ISOVUE-370 IOPAMIDOL (ISOVUE-370) INJECTION 76% COMPARISON:  None. FINDINGS: Cardiovascular: Atherosclerotic calcification of the arterial vasculature, including three-vessel involvement of the coronary arteries. Heart is enlarged. No pericardial  effusion. Mediastinum/Nodes: Mediastinal lymph nodes are not enlarged by CT size criteria. No hilar or axillary adenopathy. Esophagus is grossly unremarkable. Lungs/Pleura: Minimal dependent atelectasis bilaterally. Lungs are otherwise clear. No pleural fluid. Airway is unremarkable. Upper Abdomen: Visualized portions of the liver, spleen and stomach are grossly unremarkable. Musculoskeletal: Degenerative changes in the spine. No worrisome lytic or sclerotic lesions. IMPRESSION: 1. No acute findings. 2. Aortic atherosclerosis (ICD10-170.0). Three-vessel coronary artery calcification. Electronically Signed   By: Lorin Picket M.D.   On: 09/04/2017 10:37   Mr Thoracic Spine Wo Contrast  Result Date: 09/04/2017 CLINICAL DATA:  Fall with back pain EXAM: MRI THORACIC SPINE WITHOUT CONTRAST TECHNIQUE: Multiplanar, multisequence MR imaging of the thoracic spine was performed. No intravenous contrast was administered. COMPARISON:  Chest CT from earlier  today FINDINGS: Alignment: Exaggerated thoracic kyphosis. There is mild lumbar levocurvature and thoracic dextrocurvature. Vertebrae: T12 body fracture with horizontal fracture plane and neighboring edema. No posterior element fracture is seen. Height loss is less than 25% and there is no retropulsion. No underlying lesion is seen. Cord:  Normal signal and morphology. Paraspinal and other soft tissues: No significant finding. Disc levels: C7-T1: Central disc protrusion contacting the ventral cord. Generalized disc narrowing and desiccation with small endplate spurs, expected for age. T10-11: Right paracentral disc protrusion without compressive canal or foraminal stenosis. T12: isointense material within the left ventral canal without cord compression, not clearly extending to a disc space. L1-2 advanced disc narrowing with retrolisthesis. IMPRESSION: T12 acute body fracture with mild height loss and no retropulsion. Left ventral canal isointensity, favor noncompressive  hemorrhage rather than disc extrusion. Electronically Signed   By: Monte Fantasia M.D.   On: 09/04/2017 14:56   Ct Angio Abd/pel W And/or Wo Contrast  Result Date: 09/04/2017 CLINICAL DATA:  84 year old with an abdominal aortic aneurysm. Fall and hit back. Evaluate for retroperitoneal bleed. EXAM: CT ANGIOGRAPHY ABDOMEN AND PELVIS WITH CONTRAST AND WITHOUT CONTRAST TECHNIQUE: Multidetector CT imaging of the abdomen and pelvis was performed using the standard protocol during bolus administration of intravenous contrast. Multiplanar reconstructed images and MIPs were obtained and reviewed to evaluate the vascular anatomy. CONTRAST:  161mL ISOVUE-370 IOPAMIDOL (ISOVUE-370) INJECTION 76% COMPARISON:  CT of the abdomen and pelvis 07/03/2017 FINDINGS: VASCULAR Aorta: Diffuse atherosclerotic disease involving the descending thoracic aorta and the abdominal aorta. The abdominal aorta is tortuous and difficult to accurately measure the size. Maximum dimension of the infrarenal abdominal aorta is 4.0 cm and this is similar to the prior examination. No evidence for an aortic dissection. No evidence for an aortic rupture or retroperitoneal hemorrhage. Celiac: Stenosis at the origin. This appears to be moderate to severe narrowing that could be hemodynamically significant. Main branch vessels of the celiac artery are patent. SMA: Origin of the SMA is heavily calcified and suggestive for severe narrowing. Extensive calcifications and atherosclerotic disease along the SMA. SMA is patent. Renals: Severe atherosclerotic disease involving the origin the bilateral renal arteries and suspect bilateral renal artery stenosis. IMA: Calcifications at the origin of the IMA. The IMA is patent. There appears to be prominent collateral flow between the middle colic artery and the IMA. Inflow: Origin of the right common iliac artery is 1.8 cm with atherosclerotic disease. Right internal and right external iliac arteries are patent without  significant stenosis. Left common, internal and external iliac arteries are patent without significant stenosis. Areas of ectasia in the left internal iliac artery. Proximal Outflow: Proximal femoral arteries are patent bilaterally. Veins: No obvious venous abnormality within the limitations of this arterial phase study. Review of the MIP images confirms the above findings. NON-VASCULAR Hepatobiliary: Gallbladder has been removed. Normal appearance of the liver. Common bile duct is prominent measuring 1.2 cm and similar to the previous examination. Pancreas: Normal appearance of the pancreas without inflammation or duct dilatation. Spleen: Normal in size without focal abnormality. Adrenals/Urinary Tract: Normal adrenal glands. Nonobstructive 0.7 cm stone in the right kidney lower pole. There is a cyst in the right kidney lower pole measuring roughly 2.7 cm. No hydronephrosis. Urinary bladder is unremarkable. Stomach/Bowel: Colonic diverticulosis without acute inflammation. No evidence for bowel dilatation or obstruction. Stomach is unremarkable. Lymphatic: No significant lymph node enlargement in the abdomen or pelvis. Reproductive: Status post hysterectomy. No adnexal masses Other: No evidence for free fluid.  Specifically, no evidence for a retroperitoneal hemorrhage. There is a small periumbilical hernia containing fat. Musculoskeletal: New fracture involving the T12 vertebral body. Fracture of the T12 inferior endplate and there may be minimal bone retropulsion at this level. T12 pedicles appear to be intact. Stable retrolisthesis at L1-L2 with disc space narrowing. Stable anterolisthesis L4-L5 with extensive facet arthropathy. IMPRESSION: VASCULAR Stable 4.0 cm aneurysm of the abdominal aorta. No evidence for an aortic rupture or retroperitoneal hemorrhage. Recommend followup by ultrasound in 1 year. This recommendation follows ACR consensus guidelines: White Paper of the ACR Incidental Findings Committee II on  Vascular Findings. J Am Coll Radiol 2013; 10:789-794. Severe atherosclerotic disease involving the abdominal aorta and the visceral arteries. Stenosis involving multiple visceral arteries as described. Patient is at risk for chronic mesenteric ischemia. NON-VASCULAR New fracture of the T12 vertebral body. Inferior endplate of Y70 is mildly displaced. Minimal bone retropulsion at this level. Nonobstructive right kidney stone. These results were called by telephone at the time of interpretation on 09/04/2017 at 11:28 am to [facility], who verbally acknowledged these results. Electronically Signed   By: Markus Daft M.D.   On: 09/04/2017 11:22    Procedures Procedures (including critical care time)  Medications Ordered in ED Medications  sodium chloride 0.9 % injection (has no administration in time range)  iopamidol (ISOVUE-370) 76 % injection (has no administration in time range)  morphine 4 MG/ML injection 4 mg (has no administration in time range)  atenolol (TENORMIN) tablet 50 mg (has no administration in time range)  DULoxetine (CYMBALTA) DR capsule 30 mg (has no administration in time range)  oxymorphone (OPANA) tablet 5 mg (has no administration in time range)  rosuvastatin (CRESTOR) tablet 40 mg (has no administration in time range)  HYDROcodone-acetaminophen (NORCO/VICODIN) 5-325 MG per tablet 1-2 tablet (has no administration in time range)  morphine 2 MG/ML injection 2 mg (has no administration in time range)  fentaNYL (SUBLIMAZE) injection 50 mcg (50 mcg Intravenous Given 09/04/17 0750)  morphine 4 MG/ML injection 4 mg (4 mg Intravenous Given 09/04/17 1031)  iopamidol (ISOVUE-370) 76 % injection 100 mL (100 mLs Intravenous Contrast Given 09/04/17 1004)     Initial Impression / Assessment and Plan / ED Course  I have reviewed the triage vital signs and the nursing notes.  Pertinent labs & imaging results that were available during my care of the patient were reviewed by me and  considered in my medical decision making (see chart for details).     Patient with T12 compression fracture with noncompressive hemorrhage after fall today.  Labs are unremarkable.  CT head, chest negative for acute findings .  Rib x-ray negative for acute fractures.  CT was conducted after x-ray was negative and patient could not move due to pain.  Radiologist recommended MRI was ordered to assess further which showed no compressive hemorrhage versus disc extrusion.  I spoke with neurosurgeon, Dr. Annette Stable, who advised admission for pain control and PT as well as lumbar corset with thoracic extension and follow-up outpatient.  He also advised withholding anticoagulation.  I consulted [facility] hospitalist and spoke with Dr. Ree Kida who accepts admission for the patient.  I appreciate the above consultants for their assistance with this patient.  Pain controlled with morphine,  However patient still unable to move significantly.  Patient continues to be neurovascularly intact.  Patient also evaluated by Dr. Ellender Hose who guided the patient's management and agrees with plan.  Final Clinical Impressions(s) / ED Diagnoses   Final  diagnoses:  Fall  T12 compression fracture [facility])    ED Discharge Orders    None       Frederica Kuster, PA-C 09/04/17 1626    Duffy Bruce, MD 09/06/17 1325

## 2017-09-05 DIAGNOSIS — K08109 Complete loss of teeth, unspecified cause, unspecified class: Secondary | ICD-10-CM | POA: Diagnosis present

## 2017-09-05 DIAGNOSIS — Z9989 Dependence on other enabling machines and devices: Secondary | ICD-10-CM | POA: Diagnosis not present

## 2017-09-05 DIAGNOSIS — K5900 Constipation, unspecified: Secondary | ICD-10-CM | POA: Diagnosis not present

## 2017-09-05 DIAGNOSIS — R278 Other lack of coordination: Secondary | ICD-10-CM | POA: Diagnosis not present

## 2017-09-05 DIAGNOSIS — Z9049 Acquired absence of other specified parts of digestive tract: Secondary | ICD-10-CM | POA: Diagnosis not present

## 2017-09-05 DIAGNOSIS — Z8781 Personal history of (healed) traumatic fracture: Secondary | ICD-10-CM | POA: Diagnosis not present

## 2017-09-05 DIAGNOSIS — M545 Low back pain: Secondary | ICD-10-CM | POA: Diagnosis not present

## 2017-09-05 DIAGNOSIS — W19XXXA Unspecified fall, initial encounter: Secondary | ICD-10-CM | POA: Diagnosis not present

## 2017-09-05 DIAGNOSIS — M4854XA Collapsed vertebra, not elsewhere classified, thoracic region, initial encounter for fracture: Secondary | ICD-10-CM | POA: Diagnosis present

## 2017-09-05 DIAGNOSIS — S22080D Wedge compression fracture of T11-T12 vertebra, subsequent encounter for fracture with routine healing: Secondary | ICD-10-CM | POA: Diagnosis not present

## 2017-09-05 DIAGNOSIS — F0390 Unspecified dementia without behavioral disturbance: Secondary | ICD-10-CM | POA: Diagnosis present

## 2017-09-05 DIAGNOSIS — Z87442 Personal history of urinary calculi: Secondary | ICD-10-CM | POA: Diagnosis not present

## 2017-09-05 DIAGNOSIS — R21 Rash and other nonspecific skin eruption: Secondary | ICD-10-CM | POA: Diagnosis not present

## 2017-09-05 DIAGNOSIS — I714 Abdominal aortic aneurysm, without rupture: Secondary | ICD-10-CM | POA: Diagnosis present

## 2017-09-05 DIAGNOSIS — G894 Chronic pain syndrome: Secondary | ICD-10-CM | POA: Diagnosis not present

## 2017-09-05 DIAGNOSIS — G4733 Obstructive sleep apnea (adult) (pediatric): Secondary | ICD-10-CM | POA: Diagnosis present

## 2017-09-05 DIAGNOSIS — Z96653 Presence of artificial knee joint, bilateral: Secondary | ICD-10-CM | POA: Diagnosis present

## 2017-09-05 DIAGNOSIS — I482 Chronic atrial fibrillation: Secondary | ICD-10-CM | POA: Diagnosis not present

## 2017-09-05 DIAGNOSIS — E785 Hyperlipidemia, unspecified: Secondary | ICD-10-CM | POA: Diagnosis not present

## 2017-09-05 DIAGNOSIS — E669 Obesity, unspecified: Secondary | ICD-10-CM | POA: Diagnosis present

## 2017-09-05 DIAGNOSIS — Z8249 Family history of ischemic heart disease and other diseases of the circulatory system: Secondary | ICD-10-CM | POA: Diagnosis not present

## 2017-09-05 DIAGNOSIS — W010XXA Fall on same level from slipping, tripping and stumbling without subsequent striking against object, initial encounter: Secondary | ICD-10-CM | POA: Diagnosis present

## 2017-09-05 DIAGNOSIS — G8929 Other chronic pain: Secondary | ICD-10-CM | POA: Diagnosis present

## 2017-09-05 DIAGNOSIS — Y92009 Unspecified place in unspecified non-institutional (private) residence as the place of occurrence of the external cause: Secondary | ICD-10-CM | POA: Diagnosis not present

## 2017-09-05 DIAGNOSIS — M069 Rheumatoid arthritis, unspecified: Secondary | ICD-10-CM | POA: Diagnosis present

## 2017-09-05 DIAGNOSIS — Y9389 Activity, other specified: Secondary | ICD-10-CM | POA: Diagnosis not present

## 2017-09-05 DIAGNOSIS — M1990 Unspecified osteoarthritis, unspecified site: Secondary | ICD-10-CM | POA: Diagnosis not present

## 2017-09-05 DIAGNOSIS — Z9181 History of falling: Secondary | ICD-10-CM | POA: Diagnosis not present

## 2017-09-05 DIAGNOSIS — I481 Persistent atrial fibrillation: Secondary | ICD-10-CM | POA: Diagnosis not present

## 2017-09-05 DIAGNOSIS — R32 Unspecified urinary incontinence: Secondary | ICD-10-CM | POA: Diagnosis not present

## 2017-09-05 DIAGNOSIS — Z6829 Body mass index (BMI) 29.0-29.9, adult: Secondary | ICD-10-CM | POA: Diagnosis not present

## 2017-09-05 DIAGNOSIS — Y92002 Bathroom of unspecified non-institutional (private) residence single-family (private) house as the place of occurrence of the external cause: Secondary | ICD-10-CM | POA: Diagnosis not present

## 2017-09-05 DIAGNOSIS — R300 Dysuria: Secondary | ICD-10-CM | POA: Diagnosis not present

## 2017-09-05 DIAGNOSIS — R2681 Unsteadiness on feet: Secondary | ICD-10-CM | POA: Diagnosis not present

## 2017-09-05 DIAGNOSIS — Z743 Need for continuous supervision: Secondary | ICD-10-CM | POA: Diagnosis not present

## 2017-09-05 DIAGNOSIS — S22080A Wedge compression fracture of T11-T12 vertebra, initial encounter for closed fracture: Secondary | ICD-10-CM | POA: Diagnosis not present

## 2017-09-05 DIAGNOSIS — Z833 Family history of diabetes mellitus: Secondary | ICD-10-CM | POA: Diagnosis not present

## 2017-09-05 DIAGNOSIS — I10 Essential (primary) hypertension: Secondary | ICD-10-CM | POA: Diagnosis not present

## 2017-09-05 DIAGNOSIS — Z7901 Long term (current) use of anticoagulants: Secondary | ICD-10-CM | POA: Diagnosis not present

## 2017-09-05 DIAGNOSIS — Z823 Family history of stroke: Secondary | ICD-10-CM | POA: Diagnosis not present

## 2017-09-05 DIAGNOSIS — M810 Age-related osteoporosis without current pathological fracture: Secondary | ICD-10-CM | POA: Diagnosis not present

## 2017-09-05 DIAGNOSIS — R296 Repeated falls: Secondary | ICD-10-CM | POA: Diagnosis present

## 2017-09-05 DIAGNOSIS — R279 Unspecified lack of coordination: Secondary | ICD-10-CM | POA: Diagnosis not present

## 2017-09-05 DIAGNOSIS — M6281 Muscle weakness (generalized): Secondary | ICD-10-CM | POA: Diagnosis not present

## 2017-09-05 DIAGNOSIS — G629 Polyneuropathy, unspecified: Secondary | ICD-10-CM | POA: Diagnosis not present

## 2017-09-05 LAB — COMPREHENSIVE METABOLIC PANEL
ALT: 15 U/L (ref 14–54)
AST: 37 U/L (ref 15–41)
Albumin: 3.7 g/dL (ref 3.5–5.0)
Alkaline Phosphatase: 44 U/L (ref 38–126)
Anion gap: 12 (ref 5–15)
BUN: 15 mg/dL (ref 6–20)
CO2: 26 mmol/L (ref 22–32)
Calcium: 9.3 mg/dL (ref 8.9–10.3)
Chloride: 98 mmol/L — ABNORMAL LOW (ref 101–111)
Creatinine, Ser: 0.76 mg/dL (ref 0.44–1.00)
GFR calc Af Amer: >60 mL/min (ref >60)
GFR calc non Af Amer: >60 mL/min (ref >60)
Glucose, Bld: 111 mg/dL — ABNORMAL HIGH (ref 65–99)
Potassium: 4.9 mmol/L (ref 3.5–5.1)
Sodium: 136 mmol/L (ref 135–145)
Total Bilirubin: 2.2 mg/dL — ABNORMAL HIGH (ref 0.3–1.2)
Total Protein: 7.6 g/dL (ref 6.5–8.1)

## 2017-09-05 LAB — URINALYSIS, ROUTINE W REFLEX MICROSCOPIC
Bacteria, UA: NONE SEEN
Bilirubin Urine: NEGATIVE
Glucose, UA: NEGATIVE mg/dL
Ketones, ur: NEGATIVE mg/dL
Leukocytes, UA: NEGATIVE
Nitrite: NEGATIVE
Protein, ur: NEGATIVE mg/dL
Specific Gravity, Urine: 1.032 — ABNORMAL HIGH (ref 1.005–1.030)
pH: 6 (ref 5.0–8.0)

## 2017-09-05 LAB — CBC
HCT: 45.8 % (ref 36.0–46.0)
Hemoglobin: 15 g/dL (ref 12.0–15.0)
MCH: 30.3 pg (ref 26.0–34.0)
MCHC: 32.8 g/dL (ref 30.0–36.0)
MCV: 92.5 fL (ref 78.0–100.0)
Platelets: 145 10*3/uL — ABNORMAL LOW (ref 150–400)
RBC: 4.95 MIL/uL (ref 3.87–5.11)
RDW: 14.2 % (ref 11.5–15.5)
WBC: 10.2 10*3/uL (ref 4.0–10.5)

## 2017-09-05 MED ORDER — SODIUM CHLORIDE 0.9 % IV SOLN
INTRAVENOUS | Status: DC
Start: 2017-09-05 — End: 2017-09-08
  Administered 2017-09-05 – 2017-09-08 (×5): via INTRAVENOUS

## 2017-09-05 NOTE — Progress Notes (Addendum)
PROGRESS NOTE    [name]  UEA:[id number] DOB: [birth year] DOA: 09/04/2017 PCP: Yvonne Brooks, MD   Brief Narrative:  HPI On 08/08/2017  [name] is a 84 y.o. female with a medical history of atrial fibrillation on Eliquis, hypertension, chronic pain, who presented to the emergency department after sustaining a fall and having back pain.  Patient was attempting to move her shower seat into the tub at which point she fell backwards.  She denies hitting her head or losing consciousness.  She does report 10 out of 10 back pain.  She denies any dizziness or lightheadedness prior to the event.  Per daughters at bedside, patient has been falling more frequently lately.  Currently patient denies any chest pain, shortness of breath, abdominal pain, nausea or vomiting, diarrhea or constipation, changes in urinary frequency or pattern, dizziness or headache, change in vision, recent illness or sick contacts, recent travel.  Interim history Admitted for T12 fracture and requiring IV pain control. Needs SNF. Assessment & Plan   Back pain secondary to T12 compression fracture -Secondary to fall -Lumbar spine Xray showed no acute fracture. -Rib Xray shows old left rib fractures without acute abnormality  -CTA abdomen and pelvis: T12 compression fracture, endplate mildly displaced -MRI thoracic spine: T12 acute body fracture, mild height loss and no retropulsion.  Left central canal iso-intensity, favored noncompressive hemorrhage -CT head showed no acute abnormality -EDP discussed with Dr. Dutch [name], neurosurgery.  Recommended lumbar corset with thoracic extension.  Pain control, PT evaluation.  Outpatient follow-up -Given questionable noncompressive hemorrhage, will hold Eliquis and monitor CBC -PT, OT consulted. PT recommended SNF; pending OT -Patient also takes Opana at home chronically (substituted with morphine 15mg  TID) -Currently requiring IV morphine around the the clock along with morphine  scheduled TID and hydrocodone -Have tried reaching out to her pain management physician, Dr. Callie [name]- no answer at the office or machine  Fall -As above, PT OT will be consulted  Atrial fibrillation, persistent -Patient on Eliquis, given multiple falls and current noncompressive hemorrhage noted on MRI, will hold Eliquis.  Have discussed with patient as well as daughters at bedside, that Eliquis may need to be held indefinitely however this may be discussed with patient's PCP once out of the hospital. -Upon review of patient's chart, her PCP does state an office note 08/24/2017 that Eliquis would need to be discontinued due to risk of intracranial hemorrhage if patient continues to experience falls. -Currently rate controlled -Continue atenolol  Essential hypertension -Continue atenolol -HCTZ was discontinued as per PCPs note on 08/24/2017  Hyperlipidemia -Continue statin  Chronic pain/arthritis -Follows with pain management physician and is currently taking Opana 5mg  3 times daily  Dysuria -will obtain repeat UA and culture  DVT Prophylaxis  SCDs  Code Status: Full  Family Communication: None at bedside. Discussed with daughter via phone  Disposition Plan: Admitted needs SNF.   Needs inpatient admission given continued need of IV pain meds and lack of nutritional intake.   Consultants Neurosurgery, Dr. Dutch [name], by EDP  Procedures  None  Antibiotics   Anti-infectives (From admission, onward)   None      Subjective:   [name] seen and examined today.  Continues to complain of pain and does not feel she can move. Denies current chest pain, shortness of breath, abdominal pain, N/V/D/C.    Objective:   Vitals:   09/04/17 1932 09/04/17 1939 09/04/17 2229 09/05/17 0920  BP:   136/90 133/88  Pulse:  89 96  Resp:      Temp:   99.1 F (37.3 C)   TempSrc:   Oral   SpO2:   91% 90%  Weight:  78.9 kg (174 lb)    Height: 5\' 4"  (1.626 m) 5\' 4"  (1.626 m)       Intake/Output Summary (Last 24 hours) at 09/05/2017 1302 Last data filed at 09/05/2017 1040 Gross per 24 hour  Intake 160 ml  Output 1050 ml  Net -890 ml   Filed Weights   09/04/17 1939  Weight: 78.9 kg (174 lb)    Exam  General: Well developed, well nourished, NAD, appears stated age  HEENT: NCAT, mucous membranes moist. Edentulous   Neck: Supple  Cardiovascular: S1 S2 auscultated, no murmurs, irregular  Respiratory: Clear to auscultation bilaterally with equal chest rise  Abdomen: Soft, obese, nontender, nondistended, + bowel sounds  Extremities: warm dry without cyanosis clubbing or edema  Neuro: AAOx3, nonfocal  Psych: Appropriate mood and affect   Data Reviewed: I have personally reviewed following labs and imaging studies  CBC: Recent Labs  Lab 09/04/17 0735 09/05/17 0602  WBC 8.0 10.2  NEUTROABS 4.9  --   HGB 13.0 15.0  HCT 41.2 45.8  MCV 94.7 92.5  PLT 155 145*   Basic Metabolic Panel: Recent Labs  Lab 09/04/17 0735 09/05/17 0602  NA 141 136  K 4.2 4.9  CL 103 98*  CO2 26 26  GLUCOSE 111* 111*  BUN 20 15  CREATININE 0.89 0.76  CALCIUM 9.2 9.3   GFR: Estimated Creatinine Clearance: 56.1 mL/min (by C-G formula based on SCr of 0.76 mg/dL). Liver Function Tests: Recent Labs  Lab 09/05/17 0602  AST 37  ALT 15  ALKPHOS 44  BILITOT 2.2*  PROT 7.6  ALBUMIN 3.7   No results for input(s): LIPASE, AMYLASE in the last 168 hours. No results for input(s): AMMONIA in the last 168 hours. Coagulation Profile: No results for input(s): INR, PROTIME in the last 168 hours. Cardiac Enzymes: No results for input(s): CKTOTAL, CKMB, CKMBINDEX, TROPONINI in the last 168 hours. BNP (last 3 results) No results for input(s): PROBNP in the last 8760 hours. HbA1C: No results for input(s): HGBA1C in the last 72 hours. CBG: No results for input(s): GLUCAP in the last 168 hours. Lipid Profile: No results for input(s): CHOL, HDL, LDLCALC, TRIG, CHOLHDL,  LDLDIRECT in the last 72 hours. Thyroid Function Tests: No results for input(s): TSH, T4TOTAL, FREET4, T3FREE, THYROIDAB in the last 72 hours. Anemia Panel: No results for input(s): VITAMINB12, FOLATE, FERRITIN, TIBC, IRON, RETICCTPCT in the last 72 hours. Urine analysis:    Component Value Date/Time   COLORURINE YELLOW 09/04/2017 2349   APPEARANCEUR CLEAR 09/04/2017 2349   LABSPEC 1.032 (H) 09/04/2017 2349   PHURINE 6.0 09/04/2017 2349   GLUCOSEU NEGATIVE 09/04/2017 2349   GLUCOSEU NEG mg/dL 66/44/0347 4259   HGBUR SMALL (A) 09/04/2017 2349   HGBUR negative 03/06/2008 0846   BILIRUBINUR NEGATIVE 09/04/2017 2349   KETONESUR NEGATIVE 09/04/2017 2349   PROTEINUR NEGATIVE 09/04/2017 2349   UROBILINOGEN 0.2 09/20/2013 1535   NITRITE NEGATIVE 09/04/2017 2349   LEUKOCYTESUR NEGATIVE 09/04/2017 2349   Sepsis Labs: @LABRCNTIP (procalcitonin:4,lacticidven:4)  )No results found for this or any previous visit (from the past 240 hour(s)).    Radiology Studies: Dg Ribs Bilateral W/chest  Result Date: 09/04/2017 CLINICAL DATA:  Recent fall with chest pain, initial encounter EXAM: BILATERAL RIBS AND CHEST - 4+ VIEW COMPARISON:  07/03/2017 FINDINGS: Cardiac shadow is mildly  enlarged. Aortic calcifications are noted. There are old healed rib fractures identified involving the left sixth and seventh ribs anteriorly. No pneumothorax is noted. No focal infiltrate is seen. Degenerative changes of the thoracic spine are seen. IMPRESSION: Old left rib fractures without acute abnormality. Electronically Signed   By: Alcide Clever M.D.   On: 09/04/2017 09:03   Dg Lumbar Spine Complete  Result Date: 09/04/2017 CLINICAL DATA:  Status post fall with back pain. EXAM: LUMBAR SPINE - COMPLETE 4+ VIEW COMPARISON:  Body CT 07/03/2017 FINDINGS: There is no evidence of lumbar spine fracture. Mild levoconvex scoliosis. Extensive multilevel osteoarthritic changes with disc space narrowing, discogenic endplate sclerosis,  osteophyte formation. Associated posterior facet arthropathy. Probably degenerative in etiology 4 mm anterolisthesis of L4 on L5, 5 mm posterior listhesis of L3 on L4, and 5 mm posterior listhesis of L2 on L3. Heavy calcific atherosclerotic disease of the aorta. IMPRESSION: Advanced multilevel osteoarthritic changes of the lumbosacral spine. No evidence of acute fracture. Electronically Signed   By: Ted Mcalpine M.D.   On: 09/04/2017 09:02   Ct Head Wo Contrast  Result Date: 09/04/2017 CLINICAL DATA:  Recent fall with headaches, initial encounter EXAM: CT HEAD WITHOUT CONTRAST TECHNIQUE: Contiguous axial images were obtained from the base of the skull through the vertex without intravenous contrast. COMPARISON:  07/03/2017 FINDINGS: Brain: Atrophic changes and chronic white matter ischemic changes are seen. No findings to suggest acute hemorrhage, acute infarction or space-occupying mass lesion are seen. Vascular: No hyperdense vessel or unexpected calcification. Skull: Normal. Negative for fracture or focal lesion. Sinuses/Orbits: No acute finding. Other: None. IMPRESSION: Chronic changes without acute abnormality. Electronically Signed   By: Alcide Clever M.D.   On: 09/04/2017 08:38   Ct Chest W Contrast  Result Date: 09/04/2017 CLINICAL DATA:  Fall, on anticoagulation. EXAM: CT CHEST WITH CONTRAST TECHNIQUE: Multidetector CT imaging of the chest was performed during intravenous contrast administration. CONTRAST:  ISOVUE-370 IOPAMIDOL (ISOVUE-370) INJECTION 76% COMPARISON:  None. FINDINGS: Cardiovascular: Atherosclerotic calcification of the arterial vasculature, including three-vessel involvement of the coronary arteries. Heart is enlarged. No pericardial effusion. Mediastinum/Nodes: Mediastinal lymph nodes are not enlarged by CT size criteria. No hilar or axillary adenopathy. Esophagus is grossly unremarkable. Lungs/Pleura: Minimal dependent atelectasis bilaterally. Lungs are otherwise clear.  No pleural fluid. Airway is unremarkable. Upper Abdomen: Visualized portions of the liver, spleen and stomach are grossly unremarkable. Musculoskeletal: Degenerative changes in the spine. No worrisome lytic or sclerotic lesions. IMPRESSION: 1. No acute findings. 2. Aortic atherosclerosis (ICD10-170.0). Three-vessel coronary artery calcification. Electronically Signed   By: Leanna Battles M.D.   On: 09/04/2017 10:37   Mr Thoracic Spine Wo Contrast  Result Date: 09/04/2017 CLINICAL DATA:  Fall with back pain EXAM: MRI THORACIC SPINE WITHOUT CONTRAST TECHNIQUE: Multiplanar, multisequence MR imaging of the thoracic spine was performed. No intravenous contrast was administered. COMPARISON:  Chest CT from earlier today FINDINGS: Alignment: Exaggerated thoracic kyphosis. There is mild lumbar levocurvature and thoracic dextrocurvature. Vertebrae: T12 body fracture with horizontal fracture plane and neighboring edema. No posterior element fracture is seen. Height loss is less than 25% and there is no retropulsion. No underlying lesion is seen. Cord:  Normal signal and morphology. Paraspinal and other soft tissues: No significant finding. Disc levels: C7-T1: Central disc protrusion contacting the ventral cord. Generalized disc narrowing and desiccation with small endplate spurs, expected for age. T10-11: Right paracentral disc protrusion without compressive canal or foraminal stenosis. T12: isointense material within the left ventral canal without cord compression,  not clearly extending to a disc space. L1-2 advanced disc narrowing with retrolisthesis. IMPRESSION: T12 acute body fracture with mild height loss and no retropulsion. Left ventral canal isointensity, favor noncompressive hemorrhage rather than disc extrusion. Electronically Signed   By: Marnee Spring M.D.   On: 09/04/2017 14:56   Ct Angio Abd/pel W And/or Wo Contrast  Result Date: 09/04/2017 CLINICAL DATA:  84 year old with an abdominal aortic aneurysm.  Fall and hit back. Evaluate for retroperitoneal bleed. EXAM: CT ANGIOGRAPHY ABDOMEN AND PELVIS WITH CONTRAST AND WITHOUT CONTRAST TECHNIQUE: Multidetector CT imaging of the abdomen and pelvis was performed using the standard protocol during bolus administration of intravenous contrast. Multiplanar reconstructed images and MIPs were obtained and reviewed to evaluate the vascular anatomy. CONTRAST:  ISOVUE-370 IOPAMIDOL (ISOVUE-370) INJECTION 76% COMPARISON:  CT of the abdomen and pelvis 07/03/2017 FINDINGS: VASCULAR Aorta: Diffuse atherosclerotic disease involving the descending thoracic aorta and the abdominal aorta. The abdominal aorta is tortuous and difficult to accurately measure the size. Maximum dimension of the infrarenal abdominal aorta is 4.0 cm and this is similar to the prior examination. No evidence for an aortic dissection. No evidence for an aortic rupture or retroperitoneal hemorrhage. Celiac: Stenosis at the origin. This appears to be moderate to severe narrowing that could be hemodynamically significant. Main branch vessels of the celiac artery are patent. SMA: Origin of the SMA is heavily calcified and suggestive for severe narrowing. Extensive calcifications and atherosclerotic disease along the SMA. SMA is patent. Renals: Severe atherosclerotic disease involving the origin the bilateral renal arteries and suspect bilateral renal artery stenosis. IMA: Calcifications at the origin of the IMA. The IMA is patent. There appears to be prominent collateral flow between the middle colic artery and the IMA. Inflow: Origin of the right common iliac artery is 1.8 cm with atherosclerotic disease. Right internal and right external iliac arteries are patent without significant stenosis. Left common, internal and external iliac arteries are patent without significant stenosis. Areas of ectasia in the left internal iliac artery. Proximal Outflow: Proximal femoral arteries are patent bilaterally. Veins: No  obvious venous abnormality within the limitations of this arterial phase study. Review of the MIP images confirms the above findings. NON-VASCULAR Hepatobiliary: Gallbladder has been removed. Normal appearance of the liver. Common bile duct is prominent measuring 1.2 cm and similar to the previous examination. Pancreas: Normal appearance of the pancreas without inflammation or duct dilatation. Spleen: Normal in size without focal abnormality. Adrenals/Urinary Tract: Normal adrenal glands. Nonobstructive 0.7 cm stone in the right kidney lower pole. There is a cyst in the right kidney lower pole measuring roughly 2.7 cm. No hydronephrosis. Urinary bladder is unremarkable. Stomach/Bowel: Colonic diverticulosis without acute inflammation. No evidence for bowel dilatation or obstruction. Stomach is unremarkable. Lymphatic: No significant lymph node enlargement in the abdomen or pelvis. Reproductive: Status post hysterectomy. No adnexal masses Other: No evidence for free fluid. Specifically, no evidence for a retroperitoneal hemorrhage. There is a small periumbilical hernia containing fat. Musculoskeletal: New fracture involving the T12 vertebral body. Fracture of the T12 inferior endplate and there may be minimal bone retropulsion at this level. T12 pedicles appear to be intact. Stable retrolisthesis at L1-L2 with disc space narrowing. Stable anterolisthesis L4-L5 with extensive facet arthropathy. IMPRESSION: VASCULAR Stable 4.0 cm aneurysm of the abdominal aorta. No evidence for an aortic rupture or retroperitoneal hemorrhage. Recommend followup by ultrasound in 1 year. This recommendation follows ACR consensus guidelines: White Paper of the ACR Incidental Findings Committee II on Vascular Findings. J  Am Coll Radiol 2013; 10:789-794. Severe atherosclerotic disease involving the abdominal aorta and the visceral arteries. Stenosis involving multiple visceral arteries as described. Patient is at risk for chronic mesenteric  ischemia. NON-VASCULAR New fracture of the T12 vertebral body. Inferior endplate of T12 is mildly displaced. Minimal bone retropulsion at this level. Nonobstructive right kidney stone. These results were called by telephone at the time of interpretation on 09/04/2017 at 11:28 am to [facility], who verbally acknowledged these results. Electronically Signed   By: Richarda Overlie M.D.   On: 09/04/2017 11:22     Scheduled Meds: . atenolol  50 mg Oral Daily  . DULoxetine  30 mg Oral TID  . morphine  15 mg Oral TID  . rosuvastatin  40 mg Oral q1800   Continuous Infusions:   LOS: 0 days   Time Spent in minutes   45 minutes  Valentine Barney D.O. on 09/05/2017 at 1:02 PM  Between 7am to 7pm - Pager - [phone]  After 7pm go to [email or web address] - password [email or web address]  And look for the night coverage person covering for me after hours  [facility] Office  [phone]

## 2017-09-05 NOTE — Plan of Care (Signed)
  Problem: Clinical Measurements: Goal: Ability to maintain clinical measurements within normal limits will improve Outcome: Progressing   

## 2017-09-05 NOTE — Evaluation (Addendum)
Physical Therapy Evaluation Patient Details Name: KELLYN MCCARY MRN: [id number] DOB: April 04, 1934 Today's Date: 09/05/2017   History of Present Illness   84 y.o. female with a medical history of atrial fibrillation on Eliquis, hypertension, chronic pain, who presented to the emergency department after sustaining a fall and having back pain, DX of T 12 compression fx.   Clinical Impression  Pt admitted with above diagnosis. Pt currently with functional limitations due to the deficits listed below (see PT Problem List). Max assist for bed mobility, mod  Assist for sit to stand, pt took a few pivotal steps to recliner with RW, ambulation deferred 2* pain. Max encouragement needed for participation. Pt will benefit from skilled PT to increase their independence and safety with mobility to allow discharge to the venue listed below.       Follow Up Recommendations SNF;Supervision/Assistance - 24 hour; assistance for mobility; HHPT if SNF not an option    Equipment Recommendations  Wheelchair (measurements PT);Wheelchair cushion (measurements PT)   Patient suffers from T12 compression fracture which impairs their ability to perform daily activities like walking in the home.  A walker alone will not resolve the issues with performing activities of daily living. A wheelchair will allow patient to safely perform daily activities.  The patient can self propel in the home or has a caregiver who can provide assistance.       Recommendations for Other Services       Precautions / Restrictions Precautions Precautions: Back Required Braces or Orthoses: Spinal Brace Restrictions Weight Bearing Restrictions: No      Mobility  Bed Mobility Overal bed mobility: Needs Assistance Bed Mobility: Sidelying to Sit   Sidelying to sit: Max assist;+2 for physical assistance;+2 for safety/equipment       General bed mobility comments: assist to raise trunk, advance BLEs  Transfers Overall transfer level:  Needs assistance Equipment used: Rolling walker (2 wheeled) Transfers: Sit to/from [facility] Sit to Stand: From elevated surface;Mod assist;+2 safety/equipment;+2 physical assistance Stand pivot transfers: Min assist;+2 safety/equipment       General transfer comment: VCs hand placement, assist to rise/steady, encouragement to participate, pt took several pivotal steps to recliner with RW, no LOB, distance limited by pain  Ambulation/Gait                Stairs            Wheelchair Mobility    Modified Rankin (Stroke Patients Only)       Balance Overall balance assessment: History of Falls;Needs assistance Sitting-balance support: Feet supported Sitting balance-Leahy Scale: Fair     Standing balance support: Bilateral upper extremity supported Standing balance-Leahy Scale: Poor                               Pertinent Vitals/Pain Pain Assessment: 0-10 Pain Score: 8  Pain Location: back Pain Descriptors / Indicators: Sore Pain Intervention(s): Limited activity within patient's tolerance;Monitored during session;Premedicated before session;Repositioned    Home Living Family/patient expects to be discharged to:: Private residence Living Arrangements: Children Available Help at Discharge: Family;Available 24 hours/day Type of Home: House Home Access: Stairs to enter   [employer] of Steps: 2 Home Layout: Able to live on main level with bedroom/bathroom Home Equipment: Walker - 4 wheels      Prior Function Level of Independence: Needs assistance   Gait / Transfers Assistance Needed: uses rollator  ADL's / Homemaking Assistance Needed: son  cooks  Comments: family A her with ADL activity      Hand Dominance        Extremity/Trunk Assessment   Upper Extremity Assessment Upper Extremity Assessment: Defer to OT evaluation    Lower Extremity Assessment Lower Extremity Assessment: Generalized weakness(pt  reluctant to move LEs 2* back pain, knee ext AROM -3/5)    Cervical / Trunk Assessment Cervical / Trunk Assessment: Kyphotic  Communication   Communication: No difficulties  Cognition Arousal/Alertness: Awake/alert Behavior During Therapy: Anxious Overall Cognitive Status: Within Functional Limits for tasks assessed                                        General Comments      Exercises     Assessment/Plan    PT Assessment Patient needs continued PT services  PT Problem List Decreased strength;Decreased activity tolerance;Decreased balance;Pain;Decreased mobility       PT Treatment Interventions Gait training;Functional mobility training;Therapeutic activities;Therapeutic exercise;Balance training;Stair training;Patient/family education    PT Goals (Current goals can be found in the Care Plan section)  Acute Rehab PT Goals Patient Stated Goal: decrease pain PT Goal Formulation: With patient Time For Goal Achievement: 09/19/17 Potential to Achieve Goals: Fair    Frequency Min 3X/week   Barriers to discharge        Co-evaluation PT/OT/SLP Co-Evaluation/Treatment: Yes Reason for Co-Treatment: Complexity of the patient's impairments (multi-system involvement);For patient/therapist safety PT goals addressed during session: Mobility/safety with mobility;Balance;Proper use of DME;Strengthening/ROM         AM-PAC PT "6 Clicks" Daily Activity  Outcome Measure Difficulty turning over in bed (including adjusting bedclothes, sheets and blankets)?: A Lot Difficulty moving from lying on back to sitting on the side of the bed? : Unable Difficulty sitting down on and standing up from a chair with arms (e.g., wheelchair, bedside commode, etc,.)?: Unable Help needed moving to and from a bed to chair (including a wheelchair)?: A Lot Help needed walking in hospital room?: Total Help needed climbing 3-5 steps with a railing? : Total 6 Click Score: 8    End of  Session Equipment Utilized During Treatment: Back brace Activity Tolerance: Patient limited by pain Patient left: in chair;with call bell/phone within reach Nurse Communication: Mobility status PT Visit Diagnosis: History of falling (Z91.81);Difficulty in walking, not elsewhere classified (R26.2);Pain    Time: 1132-1202 PT Time Calculation (min) (ACUTE ONLY): 30 min   Charges:   PT Evaluation $PT Eval Moderate Complexity: 1 Mod     PT G Codes:          Philomena Doheny 09/05/2017, 12:30 PM [phone]

## 2017-09-05 NOTE — Evaluation (Signed)
Occupational Therapy Evaluation Patient Details Name: [name] MRN: [id number] DOB: November 16, 1933 Today's Date: 09/05/2017    History of Present Illness  84 y.o. female with a medical history of atrial fibrillation on Eliquis, hypertension, chronic pain, who presented to the emergency department after sustaining a fall and having back pain, DX of T 12 compression fx.    Clinical Impression   This 84 y/o female presents with the above. At baseline pt reports using RW for functional mobility, reports she receives assistance from her children for ADL completion. Pt requiring ModA+2 for sit<>stand, MinA+2 for stand pivot transfer using RW this session; currently requires MaxA for LB ADLs, minA for UB ADLs. Pt mostly limited by pain this session. Pt will benefit from continued acute OT services and recommend additional OT services in SNF setting after discharge to maximize her overall safety and independence with ADLs and mobility prior to return home.     Follow Up Recommendations  SNF;Supervision/Assistance - 24 hour    Equipment Recommendations  Other (comment)(TBD in next venue, may need 3:1 )           Precautions / Restrictions Precautions Precautions: Back Required Braces or Orthoses: Spinal Brace Restrictions Weight Bearing Restrictions: No      Mobility Bed Mobility Overal bed mobility: Needs Assistance Bed Mobility: Sidelying to Sit   Sidelying to sit: Max assist;+2 for physical assistance;+2 for safety/equipment       General bed mobility comments: assist to raise trunk, advance BLEs  Transfers Overall transfer level: Needs assistance Equipment used: Rolling walker (2 wheeled) Transfers: Sit to/from [facility] Sit to Stand: From elevated surface;Mod assist;+2 safety/equipment;+2 physical assistance Stand pivot transfers: Min assist;+2 safety/equipment       General transfer comment: VCs hand placement, assist to rise/steady, encouragement to  participate, pt took several pivotal steps to recliner with RW, no LOB, distance limited by pain    Balance Overall balance assessment: History of Falls;Needs assistance Sitting-balance support: Feet supported Sitting balance-Leahy Scale: Fair     Standing balance support: Bilateral upper extremity supported Standing balance-Leahy Scale: Poor                             ADL either performed or assessed with clinical judgement   ADL Overall ADL's : Needs assistance/impaired Eating/Feeding: Set up;Sitting Eating/Feeding Details (indicate cue type and reason): to open packets, containers  Grooming: Wash/dry face;Sitting Grooming Details (indicate cue type and reason): requires cues/encouragement to complete task on her own vs having therapist complete task for her  Upper Body Bathing: Minimal assistance;Sitting   Lower Body Bathing: Moderate assistance;+2 for physical assistance;+2 for safety/equipment;Sit to/from stand   Upper Body Dressing : Sitting;Moderate assistance Upper Body Dressing Details (indicate cue type and reason): assist to don spinal brace this session  Lower Body Dressing: Maximal assistance;+2 for physical assistance;+2 for safety/equipment;Sit to/from stand Lower Body Dressing Details (indicate cue type and reason): total assist to don socks at bed level  Toilet Transfer: Minimal assistance;+2 for safety/equipment;With caregiver independent Engineer, water Details (indicate cue type and reason): simulated with transfer to recliner  Toileting- Clothing Manipulation and Hygiene: Maximal assistance;+2 for physical assistance;+2 for safety/equipment;Sit to/from stand       Functional mobility during ADLs: Minimal assistance;+2 for physical assistance;+2 for safety/equipment;Rolling walker  Pertinent Vitals/Pain Pain Assessment: 0-10 Pain Score: 8  Pain Location: back Pain Descriptors /  Indicators: Sore Pain Intervention(s): Limited activity within patient's tolerance;Monitored during session;Premedicated before session;Repositioned          Extremity/Trunk Assessment Upper Extremity Assessment Upper Extremity Assessment: Generalized weakness   Lower Extremity Assessment Lower Extremity Assessment: Defer to PT evaluation   Cervical / Trunk Assessment Cervical / Trunk Assessment: Kyphotic   Communication Communication Communication: No difficulties   Cognition Arousal/Alertness: Awake/alert Behavior During Therapy: Anxious Overall Cognitive Status: Within Functional Limits for tasks assessed                                                      Home Living Family/patient expects to be discharged to:: Private residence Living Arrangements: Children Available Help at Discharge: Family;Available 24 hours/day Type of Home: House Home Access: Stairs to enter [employer] of Steps: 2   Home Layout: Able to live on main level with bedroom/bathroom               Home Equipment: Walker - 4 wheels          Prior Functioning/Environment Level of Independence: Needs assistance  Gait / Transfers Assistance Needed: uses rollator ADL's / Homemaking Assistance Needed: son cooks; son assists with dressing, daughter assists with bathing   Comments: family A her with ADL activity         OT Problem List: Decreased strength;Impaired balance (sitting and/or standing);Decreased knowledge of precautions;Pain;Decreased range of motion;Decreased activity tolerance;Decreased knowledge of use of DME or AE      OT Treatment/Interventions: Self-care/ADL training;DME and/or AE instruction;Therapeutic activities;Balance training;Therapeutic exercise;Patient/family education    OT Goals(Current goals can be found in the care plan section) Acute Rehab OT Goals Patient Stated Goal: decrease pain OT Goal Formulation: With patient Time For  Goal Achievement: 09/19/17 Potential to Achieve Goals: Good  OT Frequency: Min 2X/week               Co-evaluation PT/OT/SLP Co-Evaluation/Treatment: Yes Reason for Co-Treatment: Complexity of the patient's impairments (multi-system involvement);For patient/therapist safety PT goals addressed during session: Mobility/safety with mobility;Balance;Proper use of DME;Strengthening/ROM OT goals addressed during session: Proper use of Adaptive equipment and DME      AM-PAC PT "6 Clicks" Daily Activity     Outcome Measure Help from another person eating meals?: A Little Help from another person taking care of personal grooming?: A Little Help from another person toileting, which includes using toliet, bedpan, or urinal?: A Lot Help from another person bathing (including washing, rinsing, drying)?: A Lot Help from another person to put on and taking off regular upper body clothing?: A Lot Help from another person to put on and taking off regular lower body clothing?: A Lot 6 Click Score: 14   End of Session Equipment Utilized During Treatment: Back brace;Rolling walker Nurse Communication: Mobility status  Activity Tolerance: Patient limited by pain Patient left: in chair;with call bell/phone within reach  OT Visit Diagnosis: Other abnormalities of gait and mobility (R26.89);History of falling (Z91.81);Pain Pain - part of body: (back )                Time: 1132-1202 OT Time Calculation (min): 30 min Charges:  OT General Charges $OT Visit: 1 Visit OT Evaluation $OT Eval Moderate Complexity: 1 Mod G-Codes:  Lou Cal, OT Pager [phone] 09/05/2017   Raymondo Band 09/05/2017, 1:20 PM

## 2017-09-06 ENCOUNTER — Ambulatory Visit: Payer: Medicare Other | Admitting: Adult Health

## 2017-09-06 LAB — URINALYSIS, ROUTINE W REFLEX MICROSCOPIC
Bilirubin Urine: NEGATIVE
Glucose, UA: NEGATIVE mg/dL
Hgb urine dipstick: NEGATIVE
Ketones, ur: NEGATIVE mg/dL
Leukocytes, UA: NEGATIVE
Nitrite: NEGATIVE
Protein, ur: NEGATIVE mg/dL
Specific Gravity, Urine: 1.024 (ref 1.005–1.030)
pH: 5 (ref 5.0–8.0)

## 2017-09-06 NOTE — Plan of Care (Signed)
°  Problem: Coping: °Goal: Level of anxiety will decrease °Outcome: Progressing °  °

## 2017-09-06 NOTE — Clinical Social Work Placement (Addendum)
D/C Summary sent.  Nurse given number to call report.  PTAR arranged. Patient daughter Learta Codding informed.   CLINICAL SOCIAL WORK PLACEMENT  NOTE  Date:  09/06/2017  Patient Details  Name: [name] MRN: [id number] Date of Birth: [birth year]  Clinical Social Work is seeking post-discharge placement for this patient at the [place] level of care (*CSW will initial, date and re-position this form in  chart as items are completed):  Yes   Patient/family provided with [place] Work Department's list of facilities offering this level of care within the geographic area requested by the patient (or if unable, by the patient's family).  Yes   Patient/family informed of their freedom to choose among providers that offer the needed level of care, that participate in Medicare, Medicaid or managed care program needed by the patient, have an available bed and are willing to accept the patient.  Yes   Patient/family informed of [facility]'s ownership interest in [facility] and [facility], as well as of the fact that they are under no obligation to receive care at these facilities.  PASRR submitted to EDS on 09/06/17     PASRR number received on 09/06/17     Existing PASRR number confirmed on       FL2 transmitted to all facilities in geographic area requested by pt/family on       FL2 transmitted to all facilities within larger geographic area on 09/06/17     Patient informed that his/her managed care company has contracts with or will negotiate with certain facilities, including the following:  [employer] informed of bed offers received.  Patient chooses bed at [facility]     Physician recommends and patient chooses bed at      Patient to be transferred to [facility] on  .  Patient to be transferred to facility by    PTAR   Patient family notified on   of transfer. 09/08/2017  Name of family member  notified:    Adair Patter (Sister)   PHYSICIAN       Additional Comment:    _______________________________________________ Lia Hopping, LCSW 09/06/2017, 4:22 PM

## 2017-09-06 NOTE — Progress Notes (Signed)
[facility] PROGRESS NOTE    Progress Note  [name]  DDU:[id number] DOB: 1934/02/19 DOA: 09/04/2017 PCP: [name], MD     Brief Narrative:   [name] is an 84 y.o. female past medical history of atrial fibrillation on Eliquis, presents to the emergency room after sustaining fall patient was attempting to move her shower seat into the top at which point she felt backwards, she denies any loss of consciousness or hitting her head yesterday she started having back pain in the ED was found to have a T12 compression fracture.  Assessment/Plan:   Acute T12 compression fracture ([place]): Secondary to fall, CT scan of the abdomen and pelvis showed T12 compression fracture with mild displacement. MRI showed T12 acute body fracture with mild height loss and no retropulsion, the ED physician discussed with neurosurgery Dr. Trenton Gammon who recommended a lumbar corset conservative management and physical therapy evaluation.  With outpatient follow-up with them. Continue to hold Eliquis. Physical therapy and Occupational Therapy recommended skilled nursing facility. Continue MS Contin 15 mg 3 times daily, with IV for breakthrough.  Mechanical fall: Physical therapy evaluated the patient recommended skilled nursing facility.  Chronic atrial fibrillation: Eliquis has been held on this admission due to findings of a noncompressible hemorrhage noted on MRI. The previous physician discussed with family that the treatment for she might not be a candidate for anticoagulation, Going back through notes from his PCP, on 08/24/2016 PCP was concerned about recurrent falls and was considering stopping Eliquis we will go ahead and stop all anticoagulation due to the high risk of falls.  Essential hypertension: Continue atenolol blood pressure seems to be controlled.  Chronic pain: Continue current regimen.   DVT prophylaxis: scd Family Communication:son Disposition Plan/Barrier to D/C: SNF  in 2 days Code Status:     Code Status Orders  (From admission, onward)        Start     Ordered   09/04/17 1558  Full code  Continuous     09/04/17 1601    Code Status History    Date Active Date Inactive Code Status Order ID Comments User Context   07/03/2017 0438 07/05/2017 2017 Full Code [id number]  Norval Morton, MD ED   02/02/2017 0159 02/05/2017 1953 Full Code [id number]  Etta Quill, DO ED        IV Access:    Peripheral IV   Procedures and diagnostic studies:   Dg Ribs Bilateral W/chest  Result Date: 09/04/2017 CLINICAL DATA:  Recent fall with chest pain, initial encounter EXAM: BILATERAL RIBS AND CHEST - 4+ VIEW COMPARISON:  07/03/2017 FINDINGS: Cardiac shadow is mildly enlarged. Aortic calcifications are noted. There are old healed rib fractures identified involving the left sixth and seventh ribs anteriorly. No pneumothorax is noted. No focal infiltrate is seen. Degenerative changes of the thoracic spine are seen. IMPRESSION: Old left rib fractures without acute abnormality. Electronically Signed   By: Inez Catalina M.D.   On: 09/04/2017 09:03   Dg Lumbar Spine Complete  Result Date: 09/04/2017 CLINICAL DATA:  Status post fall with back pain. EXAM: LUMBAR SPINE - COMPLETE 4+ VIEW COMPARISON:  Body CT 07/03/2017 FINDINGS: There is no evidence of lumbar spine fracture. Mild levoconvex scoliosis. Extensive multilevel osteoarthritic changes with disc space narrowing, discogenic endplate sclerosis, osteophyte formation. Associated posterior facet arthropathy. Probably degenerative in etiology 4 mm anterolisthesis of L4 on L5, 5 mm posterior listhesis of L3 on L4, and 5 mm posterior listhesis of L2  on L3. Heavy calcific atherosclerotic disease of the aorta. IMPRESSION: Advanced multilevel osteoarthritic changes of the lumbosacral spine. No evidence of acute fracture. Electronically Signed   By: Fidela Salisbury M.D.   On: 09/04/2017 09:02   Ct Head Wo Contrast  Result  Date: 09/04/2017 CLINICAL DATA:  Recent fall with headaches, initial encounter EXAM: CT HEAD WITHOUT CONTRAST TECHNIQUE: Contiguous axial images were obtained from the base of the skull through the vertex without intravenous contrast. COMPARISON:  07/03/2017 FINDINGS: Brain: Atrophic changes and chronic white matter ischemic changes are seen. No findings to suggest acute hemorrhage, acute infarction or space-occupying mass lesion are seen. Vascular: No hyperdense vessel or unexpected calcification. Skull: Normal. Negative for fracture or focal lesion. Sinuses/Orbits: No acute finding. Other: None. IMPRESSION: Chronic changes without acute abnormality. Electronically Signed   By: Inez Catalina M.D.   On: 09/04/2017 08:38   Ct Chest W Contrast  Result Date: 09/04/2017 CLINICAL DATA:  Fall, on anticoagulation. EXAM: CT CHEST WITH CONTRAST TECHNIQUE: Multidetector CT imaging of the chest was performed during intravenous contrast administration. CONTRAST:  160mL ISOVUE-370 IOPAMIDOL (ISOVUE-370) INJECTION 76% COMPARISON:  None. FINDINGS: Cardiovascular: Atherosclerotic calcification of the arterial vasculature, including three-vessel involvement of the coronary arteries. Heart is enlarged. No pericardial effusion. Mediastinum/Nodes: Mediastinal lymph nodes are not enlarged by CT size criteria. No hilar or axillary adenopathy. Esophagus is grossly unremarkable. Lungs/Pleura: Minimal dependent atelectasis bilaterally. Lungs are otherwise clear. No pleural fluid. Airway is unremarkable. Upper Abdomen: Visualized portions of the liver, spleen and stomach are grossly unremarkable. Musculoskeletal: Degenerative changes in the spine. No worrisome lytic or sclerotic lesions. IMPRESSION: 1. No acute findings. 2. Aortic atherosclerosis (ICD10-170.0). Three-vessel coronary artery calcification. Electronically Signed   By: Lorin Picket M.D.   On: 09/04/2017 10:37   Mr Thoracic Spine Wo Contrast  Result Date:  09/04/2017 CLINICAL DATA:  Fall with back pain EXAM: MRI THORACIC SPINE WITHOUT CONTRAST TECHNIQUE: Multiplanar, multisequence MR imaging of the thoracic spine was performed. No intravenous contrast was administered. COMPARISON:  Chest CT from earlier today FINDINGS: Alignment: Exaggerated thoracic kyphosis. There is mild lumbar levocurvature and thoracic dextrocurvature. Vertebrae: T12 body fracture with horizontal fracture plane and neighboring edema. No posterior element fracture is seen. Height loss is less than 25% and there is no retropulsion. No underlying lesion is seen. Cord:  Normal signal and morphology. Paraspinal and other soft tissues: No significant finding. Disc levels: C7-T1: Central disc protrusion contacting the ventral cord. Generalized disc narrowing and desiccation with small endplate spurs, expected for age. T10-11: Right paracentral disc protrusion without compressive canal or foraminal stenosis. T12: isointense material within the left ventral canal without cord compression, not clearly extending to a disc space. L1-2 advanced disc narrowing with retrolisthesis. IMPRESSION: T12 acute body fracture with mild height loss and no retropulsion. Left ventral canal isointensity, favor noncompressive hemorrhage rather than disc extrusion. Electronically Signed   By: Monte Fantasia M.D.   On: 09/04/2017 14:56   Ct Angio Abd/pel W And/or Wo Contrast  Result Date: 09/04/2017 CLINICAL DATA:  84 year old with an abdominal aortic aneurysm. Fall and hit back. Evaluate for retroperitoneal bleed. EXAM: CT ANGIOGRAPHY ABDOMEN AND PELVIS WITH CONTRAST AND WITHOUT CONTRAST TECHNIQUE: Multidetector CT imaging of the abdomen and pelvis was performed using the standard protocol during bolus administration of intravenous contrast. Multiplanar reconstructed images and MIPs were obtained and reviewed to evaluate the vascular anatomy. CONTRAST:  162mL ISOVUE-370 IOPAMIDOL (ISOVUE-370) INJECTION 76% COMPARISON:  CT  of the abdomen and pelvis 07/03/2017 FINDINGS:  VASCULAR Aorta: Diffuse atherosclerotic disease involving the descending thoracic aorta and the abdominal aorta. The abdominal aorta is tortuous and difficult to accurately measure the size. Maximum dimension of the infrarenal abdominal aorta is 4.0 cm and this is similar to the prior examination. No evidence for an aortic dissection. No evidence for an aortic rupture or retroperitoneal hemorrhage. Celiac: Stenosis at the origin. This appears to be moderate to severe narrowing that could be hemodynamically significant. Main branch vessels of the celiac artery are patent. SMA: Origin of the SMA is heavily calcified and suggestive for severe narrowing. Extensive calcifications and atherosclerotic disease along the SMA. SMA is patent. Renals: Severe atherosclerotic disease involving the origin the bilateral renal arteries and suspect bilateral renal artery stenosis. IMA: Calcifications at the origin of the IMA. The IMA is patent. There appears to be prominent collateral flow between the middle colic artery and the IMA. Inflow: Origin of the right common iliac artery is 1.8 cm with atherosclerotic disease. Right internal and right external iliac arteries are patent without significant stenosis. Left common, internal and external iliac arteries are patent without significant stenosis. Areas of ectasia in the left internal iliac artery. Proximal Outflow: Proximal femoral arteries are patent bilaterally. Veins: No obvious venous abnormality within the limitations of this arterial phase study. Review of the MIP images confirms the above findings. NON-VASCULAR Hepatobiliary: Gallbladder has been removed. Normal appearance of the liver. Common bile duct is prominent measuring 1.2 cm and similar to the previous examination. Pancreas: Normal appearance of the pancreas without inflammation or duct dilatation. Spleen: Normal in size without focal abnormality. Adrenals/Urinary Tract:  Normal adrenal glands. Nonobstructive 0.7 cm stone in the right kidney lower pole. There is a cyst in the right kidney lower pole measuring roughly 2.7 cm. No hydronephrosis. Urinary bladder is unremarkable. Stomach/Bowel: Colonic diverticulosis without acute inflammation. No evidence for bowel dilatation or obstruction. Stomach is unremarkable. Lymphatic: No significant lymph node enlargement in the abdomen or pelvis. Reproductive: Status post hysterectomy. No adnexal masses Other: No evidence for free fluid. Specifically, no evidence for a retroperitoneal hemorrhage. There is a small periumbilical hernia containing fat. Musculoskeletal: New fracture involving the T12 vertebral body. Fracture of the T12 inferior endplate and there may be minimal bone retropulsion at this level. T12 pedicles appear to be intact. Stable retrolisthesis at L1-L2 with disc space narrowing. Stable anterolisthesis L4-L5 with extensive facet arthropathy. IMPRESSION: VASCULAR Stable 4.0 cm aneurysm of the abdominal aorta. No evidence for an aortic rupture or retroperitoneal hemorrhage. Recommend followup by ultrasound in 1 year. This recommendation follows ACR consensus guidelines: White Paper of the ACR Incidental Findings Committee II on Vascular Findings. J Am Coll Radiol 2013; 10:789-794. Severe atherosclerotic disease involving the abdominal aorta and the visceral arteries. Stenosis involving multiple visceral arteries as described. Patient is at risk for chronic mesenteric ischemia. NON-VASCULAR New fracture of the T12 vertebral body. Inferior endplate of W73 is mildly displaced. Minimal bone retropulsion at this level. Nonobstructive right kidney stone. These results were called by telephone at the time of interpretation on 09/04/2017 at 11:28 am to [facility], who verbally acknowledged these results. Electronically Signed   By: Markus Daft M.D.   On: 09/04/2017 11:22     Medical Consultants:    None.  Anti-Infectives:    None  Subjective:    Johnn Hai she relates her pain is not controlled.  Objective:    Vitals:   09/05/17 1400 09/05/17 1612 09/05/17 2004 09/06/17 0443  BP: Marland Kitchen)  98/58 92/64 100/73 130/84  Pulse:  77 92 95  Resp:   17 18  Temp:   98.9 F (37.2 C) 98.3 F (36.8 C)  TempSrc:   Oral Oral  SpO2:   99% 98%  Weight:      Height:        Intake/Output Summary (Last 24 hours) at 09/06/2017 0807 Last data filed at 09/06/2017 6222 Gross per 24 hour  Intake 1445 ml  Output 530 ml  Net 915 ml   Filed Weights   09/04/17 1939  Weight: 78.9 kg (174 lb)    Exam: General exam: In no acute distress. Respiratory system: Good air movement and clear to auscultation. Cardiovascular system: S1 & S2 heard, RRR.  Gastrointestinal system: Abdomen is nondistended, soft and nontender.  Central nervous system: Alert and oriented. No focal neurological deficits. Extremities: No pedal edema. Skin: No rashes, lesions or ulcers Psychiatry: Judgement and insight appear normal. Mood & affect appropriate.    Data Reviewed:    Labs: Basic Metabolic Panel: Recent Labs  Lab 09/04/17 0735 09/05/17 0602  NA 141 136  K 4.2 4.9  CL 103 98*  CO2 26 26  GLUCOSE 111* 111*  BUN 20 15  CREATININE 0.89 0.76  CALCIUM 9.2 9.3   GFR Estimated Creatinine Clearance: 56.1 mL/min (by C-G formula based on SCr of 0.76 mg/dL). Liver Function Tests: Recent Labs  Lab 09/05/17 0602  AST 37  ALT 15  ALKPHOS 44  BILITOT 2.2*  PROT 7.6  ALBUMIN 3.7   No results for input(s): LIPASE, AMYLASE in the last 168 hours. No results for input(s): AMMONIA in the last 168 hours. Coagulation profile No results for input(s): INR, PROTIME in the last 168 hours.  CBC: Recent Labs  Lab 09/04/17 0735 09/05/17 0602  WBC 8.0 10.2  NEUTROABS 4.9  --   HGB 13.0 15.0  HCT 41.2 45.8  MCV 94.7 92.5  PLT 155 145*   Cardiac Enzymes: No results for input(s): CKTOTAL, CKMB, CKMBINDEX, TROPONINI in the last 168  hours. BNP (last 3 results) No results for input(s): PROBNP in the last 8760 hours. CBG: No results for input(s): GLUCAP in the last 168 hours. D-Dimer: No results for input(s): DDIMER in the last 72 hours. Hgb A1c: No results for input(s): HGBA1C in the last 72 hours. Lipid Profile: No results for input(s): CHOL, HDL, LDLCALC, TRIG, CHOLHDL, LDLDIRECT in the last 72 hours. Thyroid function studies: No results for input(s): TSH, T4TOTAL, T3FREE, THYROIDAB in the last 72 hours.  Invalid input(s): FREET3 Anemia work up: No results for input(s): VITAMINB12, FOLATE, FERRITIN, TIBC, IRON, RETICCTPCT in the last 72 hours. Sepsis Labs: Recent Labs  Lab 09/04/17 0735 09/05/17 0602  WBC 8.0 10.2   Microbiology No results found for this or any previous visit (from the past 240 hour(s)).   Medications:   . atenolol  50 mg Oral Daily  . DULoxetine  30 mg Oral TID  . morphine  15 mg Oral TID  . rosuvastatin  40 mg Oral q1800   Continuous Infusions: . sodium chloride 75 mL/hr at 09/05/17 2014     LOS: 1 day   [facility] Pager [phone]  *Please refer to [email or web address], password [email or web address] to get updated schedule on who will round on this patient, as hospitalists switch teams weekly. If 7PM-7AM, please contact night-coverage at [email or web address], password [email or web address] for any overnight needs.  09/06/2017, 8:07 AM

## 2017-09-06 NOTE — Progress Notes (Signed)
I was notified by a NT that the pt began to cry and c/o her eyes burning. Upon assessment, pt's eyes showed no redness and no other signs of irritation were observed. A cold, wet wash cloth was applied over pt's eyes to provide relief/comfort. Dr. Aileen Fass was paged/informed.

## 2017-09-06 NOTE — Progress Notes (Signed)
Physical Therapy Treatment Patient Details Name: [name] MRN: [id number] DOB: [birth year] Today's Date: 09/06/2017    History of Present Illness  84 y.o. female with a medical history of atrial fibrillation on Eliquis, hypertension, chronic pain, who presented to the emergency department after sustaining a fall and having back pain, DX of T 12 compression fx.     PT Comments    Assisted to EOB vis Log Roll.  Applied back brace.  Pt VERY uncomfortable.  Assisted with standing but only able to take a few side steps to [facility] due to pain level.  Removed back brace and assisted back to bed vis Log Roll and positioned R sidelying with multiple pillows.  Limited activity due to pain.    Follow Up Recommendations  SNF;Supervision/Assistance - 24 hour     Equipment Recommendations       Recommendations for Other Services       Precautions / Restrictions Precautions Precautions: Back Precaution Comments: applied back brace EOB and removed pt back to bed Required Braces or Orthoses: Spinal Brace Spinal Brace: Applied in sitting position Restrictions Weight Bearing Restrictions: No    Mobility  Bed Mobility   Bed Mobility: Sidelying to Sit   Sidelying to sit: Max assist;+2 for physical assistance;+2 for safety/equipment       General bed mobility comments: "Log Roll"  Max Assist   Transfers Overall transfer level: Needs assistance Equipment used: Rolling walker (2 wheeled) Transfers: Sit to/from Stand Sit to Stand: From elevated surface;Mod assist;+2 safety/equipment;+2 physical assistance         General transfer comment: VCs hand placement, assist to rise/steady, encouragement to participate, pt took several side step to [facility]   VERY uncomfartable  Ambulation/Gait             General Gait Details: unable due to pain level   Stairs             Wheelchair Mobility    Modified Rankin (Stroke Patients Only)       Balance                                             Cognition Arousal/Alertness: Awake/alert                                     General Comments: very uncomfortable      Exercises      General Comments        Pertinent Vitals/Pain Pain Assessment: Faces Faces Pain Scale: Hurts whole lot Pain Location: back Pain Descriptors / Indicators: Constant;Grimacing;Moaning Pain Intervention(s): Monitored during session;Repositioned    Home Living                      Prior Function            PT Goals (current goals can now be found in the care plan section) Progress towards PT goals: Progressing toward goals    Frequency    Min 3X/week      PT Plan Current plan remains appropriate    Co-evaluation              AM-PAC PT "6 Clicks" Daily Activity  Outcome Measure  Difficulty turning over in bed (including adjusting bedclothes, sheets and blankets)?: Unable Difficulty moving from  lying on back to sitting on the side of the bed? : Unable Difficulty sitting down on and standing up from a chair with arms (e.g., wheelchair, bedside commode, etc,.)?: Unable Help needed moving to and from a bed to chair (including a wheelchair)?: Total Help needed walking in hospital room?: Total Help needed climbing 3-5 steps with a railing? : Total 6 Click Score: 6    End of Session Equipment Utilized During Treatment: Back brace;Gait belt;Oxygen Activity Tolerance: Patient limited by pain Patient left: in bed;with call bell/phone within reach Nurse Communication: Mobility status PT Visit Diagnosis: History of falling (Z91.81);Difficulty in walking, not elsewhere classified (R26.2);Pain     Time: 1515-1540 PT Time Calculation (min) (ACUTE ONLY): 25 min  Charges:  $Therapeutic Activity: 23-37 mins                    G Codes:       {Reghan Thul  PTA [facility]  Acute  Rehab Pager      [phone]

## 2017-09-06 NOTE — NC FL2 (Addendum)
[place] MEDICAID FL2 LEVEL OF CARE SCREENING TOOL     IDENTIFICATION  Patient Name: [name] Birthdate: [birth year] Sex: female Admission Date (Current Location): 09/04/2017  [facility] and [place] Number:  Herbalist and Address:  [facility],  [address], [place]      Provider Number: [id number]  Attending Physician Name and Address:  Charlynne Cousins, MD  Relative Name and Phone Number:       Current Level of Care: Hospital Recommended Level of Care: [place] Prior Approval Number:    Date Approved/Denied:   PASRR Number:   [id number]   Discharge Plan: SNF    Current Diagnoses: Patient Active Problem List   Diagnosis Date Noted  . Fall   . T12 compression fracture ([place]) 09/04/2017  . UTI (urinary tract infection) 07/03/2017  . Fall at home, initial encounter 07/03/2017  . A-fib ([place]) 07/03/2017  . Tachycardia 02/03/2017  . Sepsis secondary to UTI ([place]) 02/02/2017  . Chronic pain syndrome 02/02/2017  . Abnormal EKG 11/21/2013  . [name] (abdominal aortic aneurysm) ([place]) 09/30/2013  . OSA (obstructive sleep apnea) 06/29/2012  . Dementia 06/29/2012  . Pre-diabetes 06/29/2012  . Hypoxia, sleep related 06/29/2012  . RA (rheumatoid arthritis) ([place]) 06/29/2012  . Nonspecific abnormal results of cardiovascular function study 06/02/2011  . Nonspecific abnormal electrocardiogram (ECG) (EKG) 05/18/2011  . OSTEOPOROSIS 04/09/2008  . FLANK PAIN, RIGHT 03/04/2008  . Hyperlipidemia 05/12/2006  . PYELONEPHRITIS, ACUTE 05/12/2006  . OSTEOARTHRITIS 05/12/2006  . DEPENDENT EDEMA, LEGS, BILATERAL 05/12/2006  . URINARY INCONTINENCE 05/12/2006  . PERIPHERAL NEUROPATHY 03/11/2006  . Essential hypertension 03/11/2006  . DYSURIA 03/11/2006    Orientation RESPIRATION BLADDER Height & Weight     Self  Normal Continent Weight: 174 lb (78.9 kg) Height:  5\' 4"  (162.6 cm)  BEHAVIORAL SYMPTOMS/MOOD NEUROLOGICAL BOWEL  NUTRITION STATUS      Continent Diet(Heart Healthy )  AMBULATORY STATUS COMMUNICATION OF NEEDS Skin   Extensive Assist Verbally Normal                       Personal Care Assistance Level of Assistance  Bathing, Feeding, Dressing Bathing Assistance: Limited assistance Feeding assistance: Independent Dressing Assistance: Limited assistance     Functional Limitations Info  Sight, Hearing, Speech Sight Info: Adequate Hearing Info: Impaired Speech Info: Adequate    SPECIAL CARE FACTORS FREQUENCY  PT (By licensed PT), OT (By licensed OT)     PT Frequency: 5x/week OT Frequency: 5x/week            Contractures Contractures Info: Not present    Additional Factors Info  Code Status, Allergies Code Status Info: Fullcode Allergies Info: Allergies: Sulfonamide Derivatives           Current Medications (09/06/2017):  This is the current hospital active medication list Current Facility-Administered Medications  Medication Dose Route Frequency Provider Last Rate Last Dose  . 0.9 %  sodium chloride infusion   Intravenous Continuous Cristal Ford, DO 75 mL/hr at 09/05/17 2014    . atenolol (TENORMIN) tablet 50 mg  50 mg Oral Daily Cristal Ford, DO   50 mg at 09/06/17 1037  . DULoxetine (CYMBALTA) DR capsule 30 mg  30 mg Oral TID Cristal Ford, DO   30 mg at 09/06/17 1037  . HYDROcodone-acetaminophen (NORCO/VICODIN) 5-325 MG per tablet 1-2 tablet  1-2 tablet Oral Q4H PRN Cristal Ford, DO   2 tablet at 09/06/17 0445  . morphine (MSIR)  tablet 15 mg  15 mg Oral TID Cristal Ford, DO   15 mg at 09/06/17 1037  . morphine 2 MG/ML injection 2 mg  2 mg Intravenous Q4H PRN Cristal Ford, DO   1 mg at 09/06/17 7619  . rosuvastatin (CRESTOR) tablet 40 mg  40 mg Oral q1800 Cristal Ford, DO   40 mg at 09/05/17 1754     Discharge Medications: Please see discharge summary for a list of discharge medications.  Relevant Imaging Results:  Relevant Lab  Results:   Additional Information ssn:[id number]  Lia Hopping, LCSW

## 2017-09-06 NOTE — Clinical Social Work Note (Signed)
Clinical Social Work Assessment  Patient Details  Name: [name] MRN: [id number] Date of Birth: [birth year]  Date of referral:  09/06/17               Reason for consult:  Facility Placement                Permission sought to share information with:  Family Supports Permission granted to share information::  Yes, Verbal Permission Granted  Name::      [name]-  Agency::  SNF  Relationship::  Daughter   Contact Information:     Housing/Transportation Living arrangements for the past 2 months:  Single Family Home Source of Information:  Adult Children Patient Interpreter Needed:  None Criminal Activity/Legal Involvement Pertinent to Current Situation/Hospitalization:  No - Comment as needed Significant Relationships:  None Lives with:  Adult Children Do you feel safe going back to the place where you live?  No Need for family participation in patient care:  Yes (Alert to self/Dependent with mobility)  Care giving concerns:   Fall/ fracture. SNF placement for rehab.   Social Worker assessment / plan:  Patient alert to self. CSW met with the patient and her daughter at bedside. CSW explained role and reason for visit- to assist with discharge plan to SNF. Patient daughter report this is the first time the patient will need SNF care. She reports the patient lives with her adult sons but her children share responsibility to care for the patient. Daughter reports someone is always at the home with patient.  At home the patient is dependent with ADL's.The daughter assist with her bathing during the week. The uses a walker for ambulatory assistance in the home.The Daughter prefers the patient to go to facility close to their home in [place].  CSW explain SNF process and later following up with bed offers. Patient daughter express understanding process.  CSW followed up with bed offer to [facility]. Patient and daughter agreeable to SNF choice.  FL2 complete.  Plan: Assist with  discharge to SNF.   Employment status:  Retired Forensic scientist:  Medicare PT Recommendations:  [place] / Referral to community resources:  [place]  Patient/Family's Response to care: Agreeable and Responding well to care.   Patient/Family's Understanding of and Emotional Response to Diagnosis, Current Treatment, and Prognosis:  Patient daughter reports "She slow at going already, I do not expect her to walk perfectly. I want her to use the walker again." Patient daughter and other adult children (not present during assessment) have a good understanding of the patient diagnosis and follow up care.   Emotional Assessment Appearance:  Appears stated age Attitude/Demeanor/Rapport:    Affect (typically observed):  Accepting Orientation:  Oriented to Self Alcohol / Substance use:  Not Applicable Psych involvement (Current and /or in the community):  No (Comment)  Discharge Needs  Concerns to be addressed:  Discharge Planning Concerns Readmission within the last 30 days:  No Current discharge risk:  Dependent with Mobility Barriers to Discharge:  Continued Medical Work up   [employer], [place] 09/06/2017, 1:38 PM

## 2017-09-07 LAB — URINE CULTURE

## 2017-09-07 MED ORDER — POLYETHYLENE GLYCOL 3350 17 G PO PACK
17.0000 g | PACK | Freq: Every day | ORAL | Status: DC
  Administered 2017-09-07 – 2017-09-08 (×2): 17 g via ORAL
  Filled 2017-09-07 (×2): qty 1

## 2017-09-07 NOTE — Progress Notes (Signed)
[facility] PROGRESS NOTE    Progress Note  [name]  BJS:[id number] DOB: Sep 11, 1933 DOA: 09/04/2017 PCP: [name], MD     Brief Narrative:   [name] is an 84 y.o. female past medical history of atrial fibrillation on Eliquis, presents to the emergency room after sustaining fall patient was attempting to move her shower seat into the top at which point she felt backwards, she denies any loss of consciousness or hitting her head yesterday she started having back pain in the ED was found to have a T12 compression fracture.  Assessment/Plan:   Acute T12 compression fracture [facility]): Secondary to fall,  MRI CT scan as below with neurosurgery who recommend conservative management. D/c Eluquis Physical therapy and Occupational Therapy recommended skilled nursing facility, awaiting social worker recommendation bed placement. She continues to have breakthrough pain and requiring IV narcotics will titrate her narcotics.  Mechanical fall: Physical therapy evaluated the patient recommended skilled nursing facility.  Chronic atrial fibrillation: We have discontinued Eliquis.  She is not a candidate for anti-correlation due to falls Going back through notes from his PCP, on 08/24/2016 PCP was concerned about recurrent falls and was considering stopping Eliquis.  Essential hypertension: Continue atenolol blood pressure seems to be controlled.  Chronic pain: Continue current regimen.   DVT prophylaxis: scd Family Communication:son Disposition Plan/Barrier to D/C: SNF in 2 days Code Status:     Code Status Orders  (From admission, onward)        Start     Ordered   09/04/17 1558  Full code  Continuous     09/04/17 1601    Code Status History    Date Active Date Inactive Code Status Order ID Comments User Context   07/03/2017 0438 07/05/2017 2017 Full Code [id number]  Norval Morton, MD ED   02/02/2017 0159 02/05/2017 1953 Full Code [id number]  Etta Quill,  DO ED        IV Access:    Peripheral IV   Procedures and diagnostic studies:   No results found.   Medical Consultants:    None.  Anti-Infectives:   None  Subjective:    [name] she relates her pain is improved compared to yesterday but still not where she could be ambulating without significant limitations.  Objective:    Vitals:   09/06/17 1429 09/06/17 1440 09/06/17 1445 09/06/17 2207  BP:    (!) 126/91  Pulse: (!) 103   93  Resp:    17  Temp:    98.8 F (37.1 C)  TempSrc:    Oral  SpO2: 93% (!) 88% 94% 99%  Weight:      Height:        Intake/Output Summary (Last 24 hours) at 09/07/2017 0753 Last data filed at 09/07/2017 0600 Gross per 24 hour  Intake 2000 ml  Output 550 ml  Net 1450 ml   Filed Weights   09/04/17 1939  Weight: 78.9 kg (174 lb)    Exam: General exam: In no acute distress. Respiratory system: Good air movement and clear to auscultation. Cardiovascular system: S1 & S2 heard, RRR.  Gastrointestinal system: Abdomen is nondistended, soft and nontender.  Central nervous system: Alert and oriented. No focal neurological deficits. Extremities: No pedal edema. Skin: No rashes, lesions or ulcers Psychiatry: Judgement and insight appear normal. Mood & affect appropriate.    Data Reviewed:    Labs: Basic Metabolic Panel: Recent Labs  Lab 09/04/17 0735 09/05/17 0602  NA 141 136  K 4.2 4.9  CL 103 98*  CO2 26 26  GLUCOSE 111* 111*  BUN 20 15  CREATININE 0.89 0.76  CALCIUM 9.2 9.3   GFR Estimated Creatinine Clearance: 56.1 mL/min (by C-G formula based on SCr of 0.76 mg/dL). Liver Function Tests: Recent Labs  Lab 09/05/17 0602  AST 37  ALT 15  ALKPHOS 44  BILITOT 2.2*  PROT 7.6  ALBUMIN 3.7   No results for input(s): LIPASE, AMYLASE in the last 168 hours. No results for input(s): AMMONIA in the last 168 hours. Coagulation profile No results for input(s): INR, PROTIME in the last 168 hours.  CBC: Recent  Labs  Lab 09/04/17 0735 09/05/17 0602  WBC 8.0 10.2  NEUTROABS 4.9  --   HGB 13.0 15.0  HCT 41.2 45.8  MCV 94.7 92.5  PLT 155 145*   Cardiac Enzymes: No results for input(s): CKTOTAL, CKMB, CKMBINDEX, TROPONINI in the last 168 hours. BNP (last 3 results) No results for input(s): PROBNP in the last 8760 hours. CBG: No results for input(s): GLUCAP in the last 168 hours. D-Dimer: No results for input(s): DDIMER in the last 72 hours. Hgb A1c: No results for input(s): HGBA1C in the last 72 hours. Lipid Profile: No results for input(s): CHOL, HDL, LDLCALC, TRIG, CHOLHDL, LDLDIRECT in the last 72 hours. Thyroid function studies: No results for input(s): TSH, T4TOTAL, T3FREE, THYROIDAB in the last 72 hours.  Invalid input(s): FREET3 Anemia work up: No results for input(s): VITAMINB12, FOLATE, FERRITIN, TIBC, IRON, RETICCTPCT in the last 72 hours. Sepsis Labs: Recent Labs  Lab 09/04/17 0735 09/05/17 0602  WBC 8.0 10.2   Microbiology No results found for this or any previous visit (from the past 240 hour(s)).   Medications:   . atenolol  50 mg Oral Daily  . DULoxetine  30 mg Oral TID  . morphine  15 mg Oral TID  . rosuvastatin  40 mg Oral q1800   Continuous Infusions: . sodium chloride 75 mL/hr at 09/07/17 0209     LOS: 2 days   Charlynne Cousins  [facility] Pager [phone]  *Please refer to [email or web address], password [email or web address] to get updated schedule on who will round on this patient, as hospitalists switch teams weekly. If 7PM-7AM, please contact night-coverage at [email or web address], password [email or web address] for any overnight needs.  09/07/2017, 7:53 AM

## 2017-09-07 NOTE — Progress Notes (Addendum)
Pt c/o constipation. Pt requesting stool softener and/or laxative. Dr. Aileen Fass paged regarding pt's request. Currently awaiting a callback/response.   [name] ordered Miralax- was given to pt at 1500

## 2017-09-07 NOTE — Plan of Care (Signed)
TCDB encouraged, IS 1000 cc X 4.

## 2017-09-08 DIAGNOSIS — W19XXXA Unspecified fall, initial encounter: Secondary | ICD-10-CM | POA: Diagnosis not present

## 2017-09-08 DIAGNOSIS — Z9181 History of falling: Secondary | ICD-10-CM | POA: Diagnosis not present

## 2017-09-08 DIAGNOSIS — R32 Unspecified urinary incontinence: Secondary | ICD-10-CM | POA: Diagnosis not present

## 2017-09-08 DIAGNOSIS — R21 Rash and other nonspecific skin eruption: Secondary | ICD-10-CM | POA: Diagnosis not present

## 2017-09-08 DIAGNOSIS — M069 Rheumatoid arthritis, unspecified: Secondary | ICD-10-CM | POA: Diagnosis not present

## 2017-09-08 DIAGNOSIS — I10 Essential (primary) hypertension: Secondary | ICD-10-CM | POA: Diagnosis not present

## 2017-09-08 DIAGNOSIS — Y92009 Unspecified place in unspecified non-institutional (private) residence as the place of occurrence of the external cause: Secondary | ICD-10-CM | POA: Diagnosis not present

## 2017-09-08 DIAGNOSIS — G629 Polyneuropathy, unspecified: Secondary | ICD-10-CM | POA: Diagnosis not present

## 2017-09-08 DIAGNOSIS — G4733 Obstructive sleep apnea (adult) (pediatric): Secondary | ICD-10-CM | POA: Diagnosis not present

## 2017-09-08 DIAGNOSIS — I481 Persistent atrial fibrillation: Secondary | ICD-10-CM | POA: Diagnosis not present

## 2017-09-08 DIAGNOSIS — R279 Unspecified lack of coordination: Secondary | ICD-10-CM | POA: Diagnosis not present

## 2017-09-08 DIAGNOSIS — S22080A Wedge compression fracture of T11-T12 vertebra, initial encounter for closed fracture: Secondary | ICD-10-CM | POA: Diagnosis not present

## 2017-09-08 DIAGNOSIS — M1990 Unspecified osteoarthritis, unspecified site: Secondary | ICD-10-CM | POA: Diagnosis not present

## 2017-09-08 DIAGNOSIS — F0390 Unspecified dementia without behavioral disturbance: Secondary | ICD-10-CM | POA: Diagnosis not present

## 2017-09-08 DIAGNOSIS — K5900 Constipation, unspecified: Secondary | ICD-10-CM | POA: Diagnosis not present

## 2017-09-08 DIAGNOSIS — M810 Age-related osteoporosis without current pathological fracture: Secondary | ICD-10-CM | POA: Diagnosis not present

## 2017-09-08 DIAGNOSIS — R2681 Unsteadiness on feet: Secondary | ICD-10-CM | POA: Diagnosis not present

## 2017-09-08 DIAGNOSIS — E785 Hyperlipidemia, unspecified: Secondary | ICD-10-CM | POA: Diagnosis not present

## 2017-09-08 DIAGNOSIS — R278 Other lack of coordination: Secondary | ICD-10-CM | POA: Diagnosis not present

## 2017-09-08 DIAGNOSIS — M6281 Muscle weakness (generalized): Secondary | ICD-10-CM | POA: Diagnosis not present

## 2017-09-08 DIAGNOSIS — Z743 Need for continuous supervision: Secondary | ICD-10-CM | POA: Diagnosis not present

## 2017-09-08 DIAGNOSIS — I482 Chronic atrial fibrillation: Secondary | ICD-10-CM | POA: Diagnosis not present

## 2017-09-08 DIAGNOSIS — G894 Chronic pain syndrome: Secondary | ICD-10-CM | POA: Diagnosis not present

## 2017-09-08 DIAGNOSIS — I714 Abdominal aortic aneurysm, without rupture: Secondary | ICD-10-CM | POA: Diagnosis not present

## 2017-09-08 DIAGNOSIS — S22080D Wedge compression fracture of T11-T12 vertebra, subsequent encounter for fracture with routine healing: Secondary | ICD-10-CM | POA: Diagnosis not present

## 2017-09-08 MED ORDER — OXYMORPHONE HCL 5 MG PO TABS: 5.0000 mg | ORAL_TABLET | Freq: Three times a day (TID) | ORAL | 0 refills | Status: DC

## 2017-09-08 NOTE — Discharge Summary (Signed)
Physician Discharge Summary  [name] PPI:[id number] DOB: [birth year] DOA: 09/04/2017  PCP: [name], MD  Admit date: 09/04/2017 Discharge date: 09/08/2017  Admitted From: home  Disposition:  Home  Recommendations for Outpatient Follow-up:  1. Follow up with PCP in 1-2 weeks 2. Please obtain BMP/CBC in one week 3. She will be discharged to [facility]:No  Equipment/Devices:none  Discharge Condition:stable CODE STATUS:full Diet recommendation: Heart Healthy   Brief/Interim Summary: 84 y.o. female past medical history of atrial fibrillation on Eliquis, presents to the emergency room after sustaining fall patient was attempting to move her shower seat into the top at which point she felt backwards, she denies any loss of consciousness or hitting her head yesterday she started having back pain in the ED was found to have a T12 compression fracture.  Discharge Diagnoses:  Active Problems:   Hyperlipidemia   Essential hypertension   Fall at home, initial encounter   A-fib [facility])   T12 compression fracture ([place])   Fall  Acute T12 compression fracture secondary to fall: MRI and and CT scan this as below were discussed with the neurosurgeon who conservative conservative management. Physical therapy and occupational therapy evaluated the patient the recommended skilled nursing facility pain has been controlled with narcotics she will go to skilled nursing facility with narcotics.  Mechanical fall: Physical therapy evaluated the patient the recommended skilled nursing facility.  Chronic atrial fibrillation with a chads Vas score greater than 5 : Eliquis was discontinued she is not a candidate for anticoagulation due to multiple falls. She has been rate controlled.  Essential hypertension: No changes were made.  Chronic pain: Continue current regimen.   Discharge Instructions  Discharge Instructions    Diet - low sodium heart  healthy   Complete by:  As directed    Increase activity slowly   Complete by:  As directed      Allergies as of 09/08/2017      Reactions   Sulfonamide Derivatives Hives, Rash      Medication List    STOP taking these medications   apixaban 5 MG Tabs tablet Commonly known as:  ELIQUIS     TAKE these medications   atenolol 50 MG tablet Commonly known as:  TENORMIN Take 1 tablet (50 mg total) by mouth daily.   DULoxetine 30 MG capsule Commonly known as:  CYMBALTA Take 30 mg by mouth 3 (three) times daily.   gabapentin 800 MG tablet Commonly known as:  NEURONTIN TAKE 1 TABLET BY MOUTH EVERY MORNING AND AT LUNCH, 2 TABLETS BY MOUTH EVERY NIGHT AT BEDTIME   hydrochlorothiazide 25 MG tablet Commonly known as:  HYDRODIURIL TAKE 1 TABLET BY MOUTH EVERY DAY   oxymorphone 5 MG tablet Commonly known as:  OPANA Take 1 tablet (5 mg total) by mouth 3 (three) times daily.   rosuvastatin 40 MG tablet Commonly known as:  CRESTOR Take 1 tablet (40 mg total) by mouth daily.      Contact information for after-discharge care    Destination    [facility] .   Service:  Skilled Nursing Contact information: [address] [place] [phone]             Allergies  Allergen Reactions  . Sulfonamide Derivatives Hives and Rash    Consultations:  None   Procedures/Studies: Dg Ribs Bilateral W/chest  Result Date: 09/04/2017 CLINICAL DATA:  Recent fall with chest pain, initial  encounter EXAM: BILATERAL RIBS AND CHEST - 4+ VIEW COMPARISON:  07/03/2017 FINDINGS: Cardiac shadow is mildly enlarged. Aortic calcifications are noted. There are old healed rib fractures identified involving the left sixth and seventh ribs anteriorly. No pneumothorax is noted. No focal infiltrate is seen. Degenerative changes of the thoracic spine are seen. IMPRESSION: Old left rib fractures without acute abnormality. Electronically Signed   By: Inez Catalina M.D.   On: 09/04/2017 09:03   Dg Lumbar Spine Complete  Result Date: 09/04/2017 CLINICAL DATA:  Status post fall with back pain. EXAM: LUMBAR SPINE - COMPLETE 4+ VIEW COMPARISON:  Body CT 07/03/2017 FINDINGS: There is no evidence of lumbar spine fracture. Mild levoconvex scoliosis. Extensive multilevel osteoarthritic changes with disc space narrowing, discogenic endplate sclerosis, osteophyte formation. Associated posterior facet arthropathy. Probably degenerative in etiology 4 mm anterolisthesis of L4 on L5, 5 mm posterior listhesis of L3 on L4, and 5 mm posterior listhesis of L2 on L3. Heavy calcific atherosclerotic disease of the aorta. IMPRESSION: Advanced multilevel osteoarthritic changes of the lumbosacral spine. No evidence of acute fracture. Electronically Signed   By: Fidela Salisbury M.D.   On: 09/04/2017 09:02   Ct Head Wo Contrast  Result Date: 09/04/2017 CLINICAL DATA:  Recent fall with headaches, initial encounter EXAM: CT HEAD WITHOUT CONTRAST TECHNIQUE: Contiguous axial images were obtained from the base of the skull through the vertex without intravenous contrast. COMPARISON:  07/03/2017 FINDINGS: Brain: Atrophic changes and chronic white matter ischemic changes are seen. No findings to suggest acute hemorrhage, acute infarction or space-occupying mass lesion are seen. Vascular: No hyperdense vessel or unexpected calcification. Skull: Normal. Negative for fracture or focal lesion. Sinuses/Orbits: No acute finding. Other: None. IMPRESSION: Chronic changes without acute abnormality. Electronically Signed   By: Inez Catalina M.D.   On: 09/04/2017 08:38   Ct Chest W Contrast  Result Date: 09/04/2017 CLINICAL DATA:  Fall, on anticoagulation. EXAM: CT CHEST WITH CONTRAST TECHNIQUE: Multidetector CT imaging of the chest was performed during intravenous contrast administration. CONTRAST:  163mL ISOVUE-370 IOPAMIDOL (ISOVUE-370) INJECTION 76% COMPARISON:  None. FINDINGS: Cardiovascular:  Atherosclerotic calcification of the arterial vasculature, including three-vessel involvement of the coronary arteries. Heart is enlarged. No pericardial effusion. Mediastinum/Nodes: Mediastinal lymph nodes are not enlarged by CT size criteria. No hilar or axillary adenopathy. Esophagus is grossly unremarkable. Lungs/Pleura: Minimal dependent atelectasis bilaterally. Lungs are otherwise clear. No pleural fluid. Airway is unremarkable. Upper Abdomen: Visualized portions of the liver, spleen and stomach are grossly unremarkable. Musculoskeletal: Degenerative changes in the spine. No worrisome lytic or sclerotic lesions. IMPRESSION: 1. No acute findings. 2. Aortic atherosclerosis (ICD10-170.0). Three-vessel coronary artery calcification. Electronically Signed   By: Lorin Picket M.D.   On: 09/04/2017 10:37   Mr Thoracic Spine Wo Contrast  Result Date: 09/04/2017 CLINICAL DATA:  Fall with back pain EXAM: MRI THORACIC SPINE WITHOUT CONTRAST TECHNIQUE: Multiplanar, multisequence MR imaging of the thoracic spine was performed. No intravenous contrast was administered. COMPARISON:  Chest CT from earlier today FINDINGS: Alignment: Exaggerated thoracic kyphosis. There is mild lumbar levocurvature and thoracic dextrocurvature. Vertebrae: T12 body fracture with horizontal fracture plane and neighboring edema. No posterior element fracture is seen. Height loss is less than 25% and there is no retropulsion. No underlying lesion is seen. Cord:  Normal signal and morphology. Paraspinal and other soft tissues: No significant finding. Disc levels: C7-T1: Central disc protrusion contacting the ventral cord. Generalized disc narrowing and desiccation with small endplate spurs, expected for age. T10-11: Right paracentral disc protrusion  without compressive canal or foraminal stenosis. T12: isointense material within the left ventral canal without cord compression, not clearly extending to a disc space. L1-2 advanced disc narrowing  with retrolisthesis. IMPRESSION: T12 acute body fracture with mild height loss and no retropulsion. Left ventral canal isointensity, favor noncompressive hemorrhage rather than disc extrusion. Electronically Signed   By: Monte Fantasia M.D.   On: 09/04/2017 14:56   Ct Angio Abd/pel W And/or Wo Contrast  Result Date: 09/04/2017 CLINICAL DATA:  84 year old with an abdominal aortic aneurysm. Fall and hit back. Evaluate for retroperitoneal bleed. EXAM: CT ANGIOGRAPHY ABDOMEN AND PELVIS WITH CONTRAST AND WITHOUT CONTRAST TECHNIQUE: Multidetector CT imaging of the abdomen and pelvis was performed using the standard protocol during bolus administration of intravenous contrast. Multiplanar reconstructed images and MIPs were obtained and reviewed to evaluate the vascular anatomy. CONTRAST:  13mL ISOVUE-370 IOPAMIDOL (ISOVUE-370) INJECTION 76% COMPARISON:  CT of the abdomen and pelvis 07/03/2017 FINDINGS: VASCULAR Aorta: Diffuse atherosclerotic disease involving the descending thoracic aorta and the abdominal aorta. The abdominal aorta is tortuous and difficult to accurately measure the size. Maximum dimension of the infrarenal abdominal aorta is 4.0 cm and this is similar to the prior examination. No evidence for an aortic dissection. No evidence for an aortic rupture or retroperitoneal hemorrhage. Celiac: Stenosis at the origin. This appears to be moderate to severe narrowing that could be hemodynamically significant. Main branch vessels of the celiac artery are patent. SMA: Origin of the SMA is heavily calcified and suggestive for severe narrowing. Extensive calcifications and atherosclerotic disease along the SMA. SMA is patent. Renals: Severe atherosclerotic disease involving the origin the bilateral renal arteries and suspect bilateral renal artery stenosis. IMA: Calcifications at the origin of the IMA. The IMA is patent. There appears to be prominent collateral flow between the middle colic artery and the IMA.  Inflow: Origin of the right common iliac artery is 1.8 cm with atherosclerotic disease. Right internal and right external iliac arteries are patent without significant stenosis. Left common, internal and external iliac arteries are patent without significant stenosis. Areas of ectasia in the left internal iliac artery. Proximal Outflow: Proximal femoral arteries are patent bilaterally. Veins: No obvious venous abnormality within the limitations of this arterial phase study. Review of the MIP images confirms the above findings. NON-VASCULAR Hepatobiliary: Gallbladder has been removed. Normal appearance of the liver. Common bile duct is prominent measuring 1.2 cm and similar to the previous examination. Pancreas: Normal appearance of the pancreas without inflammation or duct dilatation. Spleen: Normal in size without focal abnormality. Adrenals/Urinary Tract: Normal adrenal glands. Nonobstructive 0.7 cm stone in the right kidney lower pole. There is a cyst in the right kidney lower pole measuring roughly 2.7 cm. No hydronephrosis. Urinary bladder is unremarkable. Stomach/Bowel: Colonic diverticulosis without acute inflammation. No evidence for bowel dilatation or obstruction. Stomach is unremarkable. Lymphatic: No significant lymph node enlargement in the abdomen or pelvis. Reproductive: Status post hysterectomy. No adnexal masses Other: No evidence for free fluid. Specifically, no evidence for a retroperitoneal hemorrhage. There is a small periumbilical hernia containing fat. Musculoskeletal: New fracture involving the T12 vertebral body. Fracture of the T12 inferior endplate and there may be minimal bone retropulsion at this level. T12 pedicles appear to be intact. Stable retrolisthesis at L1-L2 with disc space narrowing. Stable anterolisthesis L4-L5 with extensive facet arthropathy. IMPRESSION: VASCULAR Stable 4.0 cm aneurysm of the abdominal aorta. No evidence for an aortic rupture or retroperitoneal hemorrhage.  Recommend followup by ultrasound in 1 year. This  recommendation follows ACR consensus guidelines: White Paper of the ACR Incidental Findings Committee II on Vascular Findings. J Am Coll Radiol 2013; 10:789-794. Severe atherosclerotic disease involving the abdominal aorta and the visceral arteries. Stenosis involving multiple visceral arteries as described. Patient is at risk for chronic mesenteric ischemia. NON-VASCULAR New fracture of the T12 vertebral body. Inferior endplate of V03 is mildly displaced. Minimal bone retropulsion at this level. Nonobstructive right kidney stone. These results were called by telephone at the time of interpretation on 09/04/2017 at 11:28 am to [facility], who verbally acknowledged these results. Electronically Signed   By: Markus Daft M.D.   On: 09/04/2017 11:22      Subjective: She relates her pain is controlled  Discharge Exam: Vitals:   09/07/17 2216 09/08/17 0537  BP: (!) 131/95 115/70  Pulse: 96 92  Resp:    Temp: 98.6 F (37 C) 98.4 F (36.9 C)  SpO2: 100% 99%   Vitals:   09/07/17 0939 09/07/17 1337 09/07/17 2216 09/08/17 0537  BP: (!) 135/100 122/75 (!) 131/95 115/70  Pulse: 97 83 96 92  Resp:  (!) 91    Temp:  98.5 F (36.9 C) 98.6 F (37 C) 98.4 F (36.9 C)  TempSrc:  Oral Oral Oral  SpO2: 93% 96% 100% 99%  Weight:      Height:        General: Pt is alert, awake, not in acute distress Cardiovascular: RRR, S1/S2 +, no rubs, no gallops Respiratory: CTA bilaterally, no wheezing, no rhonchi Abdominal: Soft, NT, ND, bowel sounds + Extremities: no edema, no cyanosis    The results of significant diagnostics from this hospitalization (including imaging, microbiology, ancillary and laboratory) are listed below for reference.     Microbiology: Recent Results (from the past 240 hour(s))  Culture, Urine     Status: Abnormal   Collection Time: 09/06/17 12:42 AM  Result Value Ref Range Status   Specimen Description   Final    URINE,  CLEAN CATCH Performed at [facility], [place] [address], [place], [place] [address]    Special Requests   Final    NONE Performed at [facility], [place] [address] [place], [place] [address]    Culture MULTIPLE SPECIES PRESENT, SUGGEST RECOLLECTION (A)  Final   Report Status 09/07/2017 FINAL  Final     Labs: BNP (last 3 results) No results for input(s): BNP in the last 8760 hours. Basic Metabolic Panel: Recent Labs  Lab 09/04/17 0735 09/05/17 0602  NA 141 136  K 4.2 4.9  CL 103 98*  CO2 26 26  GLUCOSE 111* 111*  BUN 20 15  CREATININE 0.89 0.76  CALCIUM 9.2 9.3   Liver Function Tests: Recent Labs  Lab 09/05/17 0602  AST 37  ALT 15  ALKPHOS 44  BILITOT 2.2*  PROT 7.6  ALBUMIN 3.7   No results for input(s): LIPASE, AMYLASE in the last 168 hours. No results for input(s): AMMONIA in the last 168 hours. CBC: Recent Labs  Lab 09/04/17 0735 09/05/17 0602  WBC 8.0 10.2  NEUTROABS 4.9  --   HGB 13.0 15.0  HCT 41.2 45.8  MCV 94.7 92.5  PLT 155 145*   Cardiac Enzymes: No results for input(s): CKTOTAL, CKMB, CKMBINDEX, TROPONINI in the last 168 hours. BNP: Invalid input(s): POCBNP CBG: No results for input(s): GLUCAP in the last 168 hours. D-Dimer No results for input(s): DDIMER in the last 72 hours. Hgb A1c No results for input(s):  HGBA1C in the last 72 hours. Lipid Profile No results for input(s): CHOL, HDL, LDLCALC, TRIG, CHOLHDL, LDLDIRECT in the last 72 hours. Thyroid function studies No results for input(s): TSH, T4TOTAL, T3FREE, THYROIDAB in the last 72 hours.  Invalid input(s): FREET3 Anemia work up No results for input(s): VITAMINB12, FOLATE, FERRITIN, TIBC, IRON, RETICCTPCT in the last 72 hours. Urinalysis    Component Value Date/Time   COLORURINE YELLOW 09/06/2017 0013   APPEARANCEUR HAZY (A) 09/06/2017 0013   LABSPEC 1.024 09/06/2017 0013   PHURINE 5.0 09/06/2017 0013   GLUCOSEU NEGATIVE 09/06/2017 0013    GLUCOSEU NEG mg/dL 03/08/2007 2031   HGBUR NEGATIVE 09/06/2017 0013   HGBUR negative 03/06/2008 0846   BILIRUBINUR NEGATIVE 09/06/2017 0013   KETONESUR NEGATIVE 09/06/2017 0013   PROTEINUR NEGATIVE 09/06/2017 0013   UROBILINOGEN 0.2 09/20/2013 1535   NITRITE NEGATIVE 09/06/2017 0013   LEUKOCYTESUR NEGATIVE 09/06/2017 0013   Sepsis Labs Invalid input(s): PROCALCITONIN,  WBC,  LACTICIDVEN Microbiology Recent Results (from the past 240 hour(s))  Culture, Urine     Status: Abnormal   Collection Time: 09/06/17 12:42 AM  Result Value Ref Range Status   Specimen Description   Final    URINE, CLEAN CATCH Performed at [facility], [place] [address] [place], [place] [address]    Special Requests   Final    NONE Performed at [facility], [place] [address], [place], [place] [address]    Culture MULTIPLE SPECIES PRESENT, SUGGEST RECOLLECTION (A)  Final   Report Status 09/07/2017 FINAL  Final     Time coordinating discharge: 35 minutes  SIGNED:   Charlynne Cousins, MD  [facility] 09/08/2017, 8:43 AM Pager   If 7PM-7AM, please contact night-coverage [email or web address] Password [email or web address]

## 2017-09-27 DIAGNOSIS — I10 Essential (primary) hypertension: Secondary | ICD-10-CM | POA: Diagnosis not present

## 2017-09-27 DIAGNOSIS — M069 Rheumatoid arthritis, unspecified: Secondary | ICD-10-CM | POA: Diagnosis not present

## 2017-09-27 DIAGNOSIS — G629 Polyneuropathy, unspecified: Secondary | ICD-10-CM | POA: Diagnosis not present

## 2017-09-27 DIAGNOSIS — M8008XD Age-related osteoporosis with current pathological fracture, vertebra(e), subsequent encounter for fracture with routine healing: Secondary | ICD-10-CM | POA: Diagnosis not present

## 2017-09-27 DIAGNOSIS — I482 Chronic atrial fibrillation: Secondary | ICD-10-CM | POA: Diagnosis not present

## 2017-09-27 DIAGNOSIS — I714 Abdominal aortic aneurysm, without rupture: Secondary | ICD-10-CM | POA: Diagnosis not present

## 2017-09-28 DIAGNOSIS — I482 Chronic atrial fibrillation: Secondary | ICD-10-CM | POA: Diagnosis not present

## 2017-09-28 DIAGNOSIS — M069 Rheumatoid arthritis, unspecified: Secondary | ICD-10-CM | POA: Diagnosis not present

## 2017-09-28 DIAGNOSIS — I10 Essential (primary) hypertension: Secondary | ICD-10-CM | POA: Diagnosis not present

## 2017-09-28 DIAGNOSIS — I714 Abdominal aortic aneurysm, without rupture: Secondary | ICD-10-CM | POA: Diagnosis not present

## 2017-09-28 DIAGNOSIS — G629 Polyneuropathy, unspecified: Secondary | ICD-10-CM | POA: Diagnosis not present

## 2017-09-28 DIAGNOSIS — M8008XD Age-related osteoporosis with current pathological fracture, vertebra(e), subsequent encounter for fracture with routine healing: Secondary | ICD-10-CM | POA: Diagnosis not present

## 2017-09-29 DIAGNOSIS — I482 Chronic atrial fibrillation: Secondary | ICD-10-CM | POA: Diagnosis not present

## 2017-09-29 DIAGNOSIS — M8008XD Age-related osteoporosis with current pathological fracture, vertebra(e), subsequent encounter for fracture with routine healing: Secondary | ICD-10-CM | POA: Diagnosis not present

## 2017-09-29 DIAGNOSIS — I10 Essential (primary) hypertension: Secondary | ICD-10-CM | POA: Diagnosis not present

## 2017-09-29 DIAGNOSIS — M069 Rheumatoid arthritis, unspecified: Secondary | ICD-10-CM | POA: Diagnosis not present

## 2017-09-29 DIAGNOSIS — I714 Abdominal aortic aneurysm, without rupture: Secondary | ICD-10-CM | POA: Diagnosis not present

## 2017-09-29 DIAGNOSIS — G629 Polyneuropathy, unspecified: Secondary | ICD-10-CM | POA: Diagnosis not present

## 2017-10-02 DIAGNOSIS — M8008XD Age-related osteoporosis with current pathological fracture, vertebra(e), subsequent encounter for fracture with routine healing: Secondary | ICD-10-CM | POA: Diagnosis not present

## 2017-10-02 DIAGNOSIS — G629 Polyneuropathy, unspecified: Secondary | ICD-10-CM | POA: Diagnosis not present

## 2017-10-02 DIAGNOSIS — I714 Abdominal aortic aneurysm, without rupture: Secondary | ICD-10-CM | POA: Diagnosis not present

## 2017-10-02 DIAGNOSIS — M069 Rheumatoid arthritis, unspecified: Secondary | ICD-10-CM | POA: Diagnosis not present

## 2017-10-02 DIAGNOSIS — I482 Chronic atrial fibrillation: Secondary | ICD-10-CM | POA: Diagnosis not present

## 2017-10-02 DIAGNOSIS — I10 Essential (primary) hypertension: Secondary | ICD-10-CM | POA: Diagnosis not present

## 2017-10-03 DIAGNOSIS — I714 Abdominal aortic aneurysm, without rupture: Secondary | ICD-10-CM | POA: Diagnosis not present

## 2017-10-03 DIAGNOSIS — M069 Rheumatoid arthritis, unspecified: Secondary | ICD-10-CM | POA: Diagnosis not present

## 2017-10-03 DIAGNOSIS — I482 Chronic atrial fibrillation: Secondary | ICD-10-CM | POA: Diagnosis not present

## 2017-10-03 DIAGNOSIS — G629 Polyneuropathy, unspecified: Secondary | ICD-10-CM | POA: Diagnosis not present

## 2017-10-03 DIAGNOSIS — M8008XD Age-related osteoporosis with current pathological fracture, vertebra(e), subsequent encounter for fracture with routine healing: Secondary | ICD-10-CM | POA: Diagnosis not present

## 2017-10-03 DIAGNOSIS — I10 Essential (primary) hypertension: Secondary | ICD-10-CM | POA: Diagnosis not present

## 2017-10-04 DIAGNOSIS — G629 Polyneuropathy, unspecified: Secondary | ICD-10-CM | POA: Diagnosis not present

## 2017-10-04 DIAGNOSIS — I482 Chronic atrial fibrillation: Secondary | ICD-10-CM | POA: Diagnosis not present

## 2017-10-04 DIAGNOSIS — M069 Rheumatoid arthritis, unspecified: Secondary | ICD-10-CM | POA: Diagnosis not present

## 2017-10-04 DIAGNOSIS — I10 Essential (primary) hypertension: Secondary | ICD-10-CM | POA: Diagnosis not present

## 2017-10-04 DIAGNOSIS — M8008XD Age-related osteoporosis with current pathological fracture, vertebra(e), subsequent encounter for fracture with routine healing: Secondary | ICD-10-CM | POA: Diagnosis not present

## 2017-10-04 DIAGNOSIS — I714 Abdominal aortic aneurysm, without rupture: Secondary | ICD-10-CM | POA: Diagnosis not present

## 2017-10-05 DIAGNOSIS — I714 Abdominal aortic aneurysm, without rupture: Secondary | ICD-10-CM | POA: Diagnosis not present

## 2017-10-05 DIAGNOSIS — I10 Essential (primary) hypertension: Secondary | ICD-10-CM | POA: Diagnosis not present

## 2017-10-05 DIAGNOSIS — G629 Polyneuropathy, unspecified: Secondary | ICD-10-CM | POA: Diagnosis not present

## 2017-10-05 DIAGNOSIS — I482 Chronic atrial fibrillation: Secondary | ICD-10-CM | POA: Diagnosis not present

## 2017-10-05 DIAGNOSIS — M8008XD Age-related osteoporosis with current pathological fracture, vertebra(e), subsequent encounter for fracture with routine healing: Secondary | ICD-10-CM | POA: Diagnosis not present

## 2017-10-05 DIAGNOSIS — M069 Rheumatoid arthritis, unspecified: Secondary | ICD-10-CM | POA: Diagnosis not present

## 2017-10-06 ENCOUNTER — Ambulatory Visit (INDEPENDENT_AMBULATORY_CARE_PROVIDER_SITE_OTHER): Payer: Medicare Other | Admitting: Family Medicine

## 2017-10-06 ENCOUNTER — Encounter: Payer: Self-pay | Admitting: Family Medicine

## 2017-10-06 VITALS — BP 110/78 | HR 93 | Temp 98.0°F | Resp 20 | Ht 60.0 in | Wt 170.0 lb

## 2017-10-06 DIAGNOSIS — G894 Chronic pain syndrome: Secondary | ICD-10-CM

## 2017-10-06 DIAGNOSIS — I6381 Other cerebral infarction due to occlusion or stenosis of small artery: Secondary | ICD-10-CM | POA: Diagnosis not present

## 2017-10-06 DIAGNOSIS — R1032 Left lower quadrant pain: Secondary | ICD-10-CM

## 2017-10-06 DIAGNOSIS — I482 Chronic atrial fibrillation: Secondary | ICD-10-CM | POA: Diagnosis not present

## 2017-10-06 DIAGNOSIS — R300 Dysuria: Secondary | ICD-10-CM | POA: Diagnosis not present

## 2017-10-06 DIAGNOSIS — I10 Essential (primary) hypertension: Secondary | ICD-10-CM | POA: Diagnosis not present

## 2017-10-06 DIAGNOSIS — I481 Persistent atrial fibrillation: Secondary | ICD-10-CM | POA: Diagnosis not present

## 2017-10-06 DIAGNOSIS — I714 Abdominal aortic aneurysm, without rupture: Secondary | ICD-10-CM | POA: Diagnosis not present

## 2017-10-06 DIAGNOSIS — Z09 Encounter for follow-up examination after completed treatment for conditions other than malignant neoplasm: Secondary | ICD-10-CM | POA: Diagnosis not present

## 2017-10-06 DIAGNOSIS — G629 Polyneuropathy, unspecified: Secondary | ICD-10-CM | POA: Diagnosis not present

## 2017-10-06 DIAGNOSIS — I4819 Other persistent atrial fibrillation: Secondary | ICD-10-CM

## 2017-10-06 DIAGNOSIS — S22080A Wedge compression fracture of T11-T12 vertebra, initial encounter for closed fracture: Secondary | ICD-10-CM | POA: Diagnosis not present

## 2017-10-06 DIAGNOSIS — M069 Rheumatoid arthritis, unspecified: Secondary | ICD-10-CM | POA: Diagnosis not present

## 2017-10-06 DIAGNOSIS — M8008XD Age-related osteoporosis with current pathological fracture, vertebra(e), subsequent encounter for fracture with routine healing: Secondary | ICD-10-CM | POA: Diagnosis not present

## 2017-10-06 LAB — URINALYSIS, ROUTINE W REFLEX MICROSCOPIC
Bacteria, UA: NONE SEEN /HPF
Bilirubin Urine: NEGATIVE
Glucose, UA: NEGATIVE
Hgb urine dipstick: NEGATIVE
Ketones, ur: NEGATIVE
Nitrite: NEGATIVE
Specific Gravity, Urine: 1.02 (ref 1.001–1.03)
pH: 7 (ref 5.0–8.0)

## 2017-10-06 LAB — MICROSCOPIC MESSAGE

## 2017-10-06 MED ORDER — CIPROFLOXACIN HCL 250 MG PO TABS: 250.0000 mg | ORAL_TABLET | Freq: Two times a day (BID) | ORAL | 0 refills | Status: DC

## 2017-10-06 NOTE — Progress Notes (Signed)
Subjective:    Patient ID: [name], female    DOB: [birth year], 84 y.o.   MRN: [id number]  HPI Patient was admitted to the hospital in early May after suffering a fall.  I have copied relevant portions of the discharge summary and included them below for my reference: Admit date: 09/04/2017 Discharge date: 09/08/2017  Admitted From: home  Disposition:  Home  Recommendations for Outpatient Follow-up:  1. Follow up with PCP in 1-2 weeks 2. Please obtain BMP/CBC in one week 3. She will be discharged to [facility]:No  Equipment/Devices:none  Discharge Condition:stable CODE STATUS:full Diet recommendation: Heart Healthy   Brief/Interim Summary: 84 y.o.femalepast medical history of atrial fibrillation on Eliquis, presents to the emergency room after sustaining fall.  Patient was attempting to move her shower seat into the top at which point she felt backwards, she denies any loss of consciousness or hitting her head yesterday she started having back pain in the ED was found to have a T12 compression fracture.  Discharge Diagnoses:  Active Problems:   Hyperlipidemia   Essential hypertension   Fall at home, initial encounter   A-fib [facility])   T12 compression fracture ([place])   Fall  Acute T12 compression fracture secondary to fall: MRI and and CT scan this as below were discussed with the neurosurgeon who conservative conservative management. Physical therapy and occupational therapy evaluated the patient the recommended skilled nursing facility pain has been controlled with narcotics she will go to skilled nursing facility with narcotics.  Mechanical fall: Physical therapy evaluated the patient the recommended skilled nursing facility.  Chronic atrial fibrillation with a chads Vas score greater than 5 : Eliquis was discontinued she is not a candidate for anticoagulation due to multiple falls. She has been rate  controlled.  Essential hypertension: No changes were made.  Chronic pain: Continue current regimen.    STOP taking these medications   apixaban 5 MG Tabs tablet Commonly known as:  ELIQUIS     TAKE these medications   atenolol 50 MG tablet Commonly known as:  TENORMIN Take 1 tablet (50 mg total) by mouth daily.   DULoxetine 30 MG capsule Commonly known as:  CYMBALTA Take 30 mg by mouth 3 (three) times daily.   gabapentin 800 MG tablet Commonly known as:  NEURONTIN TAKE 1 TABLET BY MOUTH EVERY MORNING AND AT LUNCH, 2 TABLETS BY MOUTH EVERY NIGHT AT BEDTIME   hydrochlorothiazide 25 MG tablet Commonly known as:  HYDRODIURIL TAKE 1 TABLET BY MOUTH EVERY DAY   oxymorphone 5 MG tablet Commonly known as:  OPANA Take 1 tablet (5 mg total) by mouth 3 (three) times daily.   rosuvastatin 40 MG tablet Commonly known as:  CRESTOR Take 1 tablet (40 mg total) by mouth daily.   10/06/17 Patient is here today for follow-up.  She reports complains of left flank pain radiating into her left abdomen just below the level of her ribs.  The pain is sharp and constant and has been present ever since she left the hospital.  She denies any constipation.  She denies any blood in her stool.  She denies any dysuria urgency or frequency.  However she has been shaking and experiencing tremors and every time that has happened, she has had a urinary tract infection.  She is concerned that she may have another urinary tract infection.  I reviewed the MRI findings from the hospitalization.  The results are included below:  FINDINGS:  Alignment: Exaggerated thoracic kyphosis. There is mild lumbar levocurvature and thoracic dextrocurvature.  Vertebrae:  T12 body fracture with horizontal fracture plane and neighboring edema. No posterior element fracture is seen. Height loss is less than 25% and there is no retropulsion. No underlying lesion is seen.  Cord:  Normal signal and  morphology.  Paraspinal and other soft tissues: No significant finding.  Disc levels:  C7-T1: Central disc protrusion contacting the ventral cord.  Generalized disc narrowing and desiccation with small endplate spurs, expected for age.  T10-11: Right paracentral disc protrusion without compressive canal or foraminal stenosis.  T12: isointense material within the left ventral canal without cord compression, not clearly extending to a disc space.  L1-2 advanced disc narrowing with retrolisthesis.   Does appear to be material to the left side of the ventral canal which is thought to be blood and edema.  I question if this could possibly be impinging upon the left nerve root.  On exam today, the patient is mildly tender to palpation under the left ribs.  There is no palpable mass.  There is no rash or swelling in that area.  Bowel sounds are normal.  Her anticoagulation was discontinued in the hospital due to her recurrent falls which I clearly agree with.  However she is currently not taking any medication for stroke prevention.  She is in atrial fibrillation and also has a history of microvascular strokes/lacunar strokes as well.  She continues also to complain of midline back pain roughly the level of T12 which is worse with standing.  I also reviewed her CT scan obtained in the hospital of the abdomen and pelvis in the results are included below:  IMPRESSION: VASCULAR  Stable 4.0 cm aneurysm of the abdominal aorta. No evidence for an aortic rupture or retroperitoneal hemorrhage. Recommend followup by ultrasound in 1 year. This recommendation follows ACR consensus guidelines: White Paper of the ACR Incidental Findings Committee II on Vascular Findings. J Am Coll Radiol 2013; 10:789-794.  Severe atherosclerotic disease involving the abdominal aorta and the visceral arteries. Stenosis involving multiple visceral arteries as described. Patient is at risk for chronic  mesenteric ischemia.  NON-VASCULAR  New fracture of the T12 vertebral body. Inferior endplate of F09 is mildly displaced. Minimal bone retropulsion at this level.  Nonobstructive right kidney stone.   Past Medical History:  Diagnosis Date  . [name] (abdominal aortic aneurysm) ([place]) 09/2013   3.6 cm  . Atrial fibrillation ([place])   . Dementia   . Diabetes mellitus    pre-diabetes  . Diverticulosis   . Hyperlipidemia   . Hypertension   . Nephrolithiasis   . OSA on CPAP   . Osteoarthritis   . Osteopenia    T=  -2.1 in hip  . Rheumatoid arthritis(714.0)   . S/P carpal tunnel release   . Spinal stenosis    Past Surgical History:  Procedure Laterality Date  . APPENDECTOMY    . CARPAL TUNNEL RELEASE  12/27/2006   Right subcutaneous ulnar nerve transfer -- Right open carpal tunnel release  . CHOLECYSTECTOMY    . FOOT SURGERY    . JOINT REPLACEMENT     BTKR, RTHR  . TOTAL KNEE ARTHROPLASTY  08/21/2007   Right total knee replacement   Current Outpatient Medications on File Prior to Visit  Medication Sig Dispense Refill  . atenolol (TENORMIN) 50 MG tablet Take 1 tablet (50 mg total) by mouth daily. 90 tablet 3  . DULoxetine (CYMBALTA) 30 MG capsule Take 30  mg by mouth 3 (three) times daily.   2  . gabapentin (NEURONTIN) 800 MG tablet TAKE 1 TABLET BY MOUTH EVERY MORNING AND AT LUNCH, 2 TABLETS BY MOUTH EVERY NIGHT AT BEDTIME 360 tablet 4  . oxyCODONE-acetaminophen (PERCOCET/ROXICET) 5-325 MG tablet Take 1 tablet by mouth every 4 (four) hours as needed for severe pain.    Marland Kitchen oxymorphone (OPANA) 5 MG tablet Take 1 tablet (5 mg total) by mouth 3 (three) times daily. 5 tablet 0  . rosuvastatin (CRESTOR) 40 MG tablet Take 1 tablet (40 mg total) by mouth daily. 90 tablet 3   No current facility-administered medications on file prior to visit.    Allergies  Allergen Reactions  . Sulfonamide Derivatives Hives and Rash   Social History   Socioeconomic History  . Marital status:  Divorced    Spouse name: Not on file  . Number of children: 6  . Years of education: Not on file  . Highest education level: Not on file  Occupational History  . Not on file  Social Needs  . Financial resource strain: Not on file  . Food insecurity:    Worry: Not on file    Inability: Not on file  . Transportation needs:    Medical: Not on file    Non-medical: Not on file  Tobacco Use  . Smoking status: Never Smoker  . Smokeless tobacco: Never Used  Substance and Sexual Activity  . Alcohol use: No  . Drug use: No  . Sexual activity: Not on file  Lifestyle  . Physical activity:    Days per week: Not on file    Minutes per session: Not on file  . Stress: Not on file  Relationships  . Social connections:    Talks on phone: Not on file    Gets together: Not on file    Attends religious service: Not on file    Active member of club or organization: Not on file    Attends meetings of clubs or organizations: Not on file    Relationship status: Not on file  . Intimate partner violence:    Fear of current or ex partner: Not on file    Emotionally abused: Not on file    Physically abused: Not on file    Forced sexual activity: Not on file  Other Topics Concern  . Not on file  Social History Narrative  . Not on file    Review of Systems  All other systems reviewed and are negative.      Objective:   Physical Exam  Constitutional: She appears well-developed and well-nourished. No distress.  Cardiovascular: Normal rate. Exam reveals no gallop and no friction rub.  No murmur heard. Pulmonary/Chest: Effort normal and breath sounds normal. No stridor. No respiratory distress. She has no wheezes. She has no rales.  Abdominal: Soft. Bowel sounds are normal. She exhibits no distension and no mass. There is tenderness. There is guarding. There is no rebound. No hernia.    Musculoskeletal: She exhibits tenderness.       Thoracic back: She exhibits decreased range of motion,  tenderness and bony tenderness.       Back:  Skin: She is not diaphoretic.  Vitals reviewed.         Assessment & Plan:  Dysuria - Plan: Urinalysis, Routine w reflex microscopic  Persistent atrial fibrillation [facility])  Hospital discharge follow-up  Lacunar infarction ([place])  T12 compression fracture ([facility])  Chronic pain syndrome  LLQ abdominal pain  Patient's back pain is gradually slowly improving however I am concerned by the left flank pain.  Differential diagnosis includes muscle tenderness due to the brace that she is wearing, referred pain due to possible left T12 nerve root impingement, urinary tract infection, diverticulitis.  Urinalysis was obtained today in the office.  It does show +1 leukocyte esterase and occasional white blood cells and occasional rbc's under high-power field however is not impressive.  I will however start the patient on Cipro 250 mg p.o. twice daily for 5 days and await culture results.  If pain is not improving over the weekend, I would consider treating the patient for possible left T12 nerve root impingement.  If she develops diarrhea or worsening left lower quadrant abdominal pain, consider switching antibiotics/extending antibiotics to cover diverticulitis by the addition of Flagyl.  Recheck next week.  Meanwhile I did recommend that they start taking aspirin 81 mg daily for stroke prevention.  Patient also requires a hospital bed.  She is unable to sit up and get out of bed without maximum assistance due to the pain in her back, her weakness, and her deconditioning.  The patient requires positioning of the body in bed in order to alleviate pain that is not feasible with an ordinary bed.  She also would benefit from traction equipment which can only be attached to a hospital bed that would help her sit up in bed and get out of bed due to pain in her back and weakness.  Pillows and wedges have been tried and are not sufficient.

## 2017-10-09 DIAGNOSIS — I10 Essential (primary) hypertension: Secondary | ICD-10-CM | POA: Diagnosis not present

## 2017-10-09 DIAGNOSIS — M069 Rheumatoid arthritis, unspecified: Secondary | ICD-10-CM | POA: Diagnosis not present

## 2017-10-09 DIAGNOSIS — I714 Abdominal aortic aneurysm, without rupture: Secondary | ICD-10-CM | POA: Diagnosis not present

## 2017-10-09 DIAGNOSIS — I482 Chronic atrial fibrillation: Secondary | ICD-10-CM | POA: Diagnosis not present

## 2017-10-09 DIAGNOSIS — M8008XD Age-related osteoporosis with current pathological fracture, vertebra(e), subsequent encounter for fracture with routine healing: Secondary | ICD-10-CM | POA: Diagnosis not present

## 2017-10-09 DIAGNOSIS — G629 Polyneuropathy, unspecified: Secondary | ICD-10-CM | POA: Diagnosis not present

## 2017-10-10 DIAGNOSIS — M069 Rheumatoid arthritis, unspecified: Secondary | ICD-10-CM | POA: Diagnosis not present

## 2017-10-10 DIAGNOSIS — M8008XD Age-related osteoporosis with current pathological fracture, vertebra(e), subsequent encounter for fracture with routine healing: Secondary | ICD-10-CM | POA: Diagnosis not present

## 2017-10-10 DIAGNOSIS — G629 Polyneuropathy, unspecified: Secondary | ICD-10-CM | POA: Diagnosis not present

## 2017-10-10 DIAGNOSIS — I714 Abdominal aortic aneurysm, without rupture: Secondary | ICD-10-CM | POA: Diagnosis not present

## 2017-10-10 DIAGNOSIS — I10 Essential (primary) hypertension: Secondary | ICD-10-CM | POA: Diagnosis not present

## 2017-10-10 DIAGNOSIS — I482 Chronic atrial fibrillation: Secondary | ICD-10-CM | POA: Diagnosis not present

## 2017-10-10 NOTE — Progress Notes (Signed)
Cardiology Office Note     Date:  10/11/2017   ID:  [name], DOB 1933-05-12, MRN [id number]  PCP:  Lelon Perla, MD  Cardiologist:  Stanford Breed  Chief Complaint  Patient presents with  . Follow-up     History of Present Illness: [name] is a 84 y.o. female who presents for ongoing assessment and management of a small [name] followed by vascular surgery, atrial fibrillation on Eliquis, who presents after recent admission for mechanical fall. She sustained a T 12 fx She was discharged to SNF for OT and PT. Eliquis was discontinued due to continued fall risk.   She has been seen by PCP since discharge was placed him back on 81 mg of aspirin.  She is now home from skilled nursing facility for the last 2 weeks and has OT, PT, home health nurse visits weekly.  She is progressing slowly but is moving forward.  She denies any recurrent chest pain or racing heart rate, dizziness, or near syncope.  Past Medical History:  Diagnosis Date  . [name] (abdominal aortic aneurysm) ([place]) 09/2013   3.6 cm  . Atrial fibrillation ([place])   . Dementia   . Diabetes mellitus    pre-diabetes  . Diverticulosis   . Hyperlipidemia   . Hypertension   . Nephrolithiasis   . OSA on CPAP   . Osteoarthritis   . Osteopenia    T=  -2.1 in hip  . Rheumatoid arthritis(714.0)   . S/P carpal tunnel release   . Spinal stenosis     Past Surgical History:  Procedure Laterality Date  . APPENDECTOMY    . CARPAL TUNNEL RELEASE  12/27/2006   Right subcutaneous ulnar nerve transfer -- Right open carpal tunnel release  . CHOLECYSTECTOMY    . FOOT SURGERY    . JOINT REPLACEMENT     BTKR, RTHR  . TOTAL KNEE ARTHROPLASTY  08/21/2007   Right total knee replacement     Current Outpatient Medications  Medication Sig Dispense Refill  . atenolol (TENORMIN) 50 MG tablet Take 1 tablet (50 mg total) by mouth daily. 90 tablet 3  . DULoxetine (CYMBALTA) 30 MG capsule Take 30 mg by mouth 3 (three) times daily.   2  .  gabapentin (NEURONTIN) 800 MG tablet TAKE 1 TABLET BY MOUTH EVERY MORNING AND AT LUNCH, 2 TABLETS BY MOUTH EVERY NIGHT AT BEDTIME 360 tablet 4  . oxyCODONE-acetaminophen (PERCOCET/ROXICET) 5-325 MG tablet Take 1 tablet by mouth every 4 (four) hours as needed for severe pain.    Marland Kitchen oxymorphone (OPANA) 5 MG tablet Take 1 tablet (5 mg total) by mouth 3 (three) times daily. 5 tablet 0  . rosuvastatin (CRESTOR) 40 MG tablet Take 1 tablet (40 mg total) by mouth daily. 90 tablet 3   No current facility-administered medications for this visit.     Allergies:   Sulfonamide derivatives    Social History:  The patient  reports that she has never smoked. She has never used smokeless tobacco. She reports that she does not drink alcohol or use drugs.   Family History:  The patient's family history includes Coronary artery disease in her son; Diabetes in her brother; Heart attack in her son; Heart disease in her son; Hypertension in her maternal aunt and sister; Stroke in her father and mother.    ROS: All other systems are reviewed and negative. Unless otherwise mentioned in H&P    PHYSICAL EXAM: VS:  BP 106/73   Pulse 97  Ht 5' (1.524 m)   Wt 170 lb (77.1 kg)   BMI 33.20 kg/m  , BMI Body mass index is 33.2 kg/m. GEN: Well nourished, well developed, in no acute distress  HEENT: normal  Neck: no JVD, carotid bruits, or masses Cardiac: RRR,  no murmurs, rubs, or gallops,no edema  Respiratory:  clear to auscultation bilaterally, normal work of breathing, crackles in the bases, with coughing elicited with deep breathing.  GI: soft, nontender, nondistended, + BS MS: no deformity or atrophy  Skin: warm and dry, no rash Neuro:  Strength and sensation are intact Psych: euthymic mood, full affect   EKG: Not completed during this office visit  Recent Labs: 07/03/2017: TSH 1.199 07/05/2017: Magnesium 1.7 09/05/2017: ALT 15; BUN 15; Creatinine, Ser 0.76; Hemoglobin 15.0; Platelets 145; Potassium 4.9;  Sodium 136    Lipid Panel    Component Value Date/Time   CHOL 97 04/17/2017 0931   TRIG 105 04/17/2017 0931   HDL 42 (L) 04/17/2017 0931   CHOLHDL 2.3 04/17/2017 0931   VLDL 26 06/17/2016 1045   LDLCALC 36 04/17/2017 0931      Wt Readings from Last 3 Encounters:  10/11/17 170 lb (77.1 kg)  10/06/17 170 lb (77.1 kg)  09/04/17 174 lb (78.9 kg)      Other studies Reviewed: Echo 07/03/2017 Left ventricle: The cavity size was normal. There was mild   concentric hypertrophy. Systolic function was normal. The   estimated ejection fraction was in the range of 60% to 65%. Wall   motion was normal; there were no regional wall motion   abnormalities. - Mitral valve: Calcified annulus. - Left atrium: The atrium was moderately dilated. - Pulmonary arteries: PA peak pressure: 36 mm Hg (S).  Impressions:  - The right ventricular systolic pressure was increased consistent   with mild pulmonary hypertension.   ASSESSMENT AND PLAN:  1.  Atrial fibrillation: Heart rate slightly elevated today on initial vital signs, that has normalized during assessment.  She is no longer on Eliquis due to fall risk as she has had multiple falls with recent admission for same.  She is currently on low-dose aspirin 81 mg daily.  2.  Hypertension: Low normal.  She is asymptomatic currently.  No complaints of dizziness with position change.  She is on atenolol only.   3. T-12 Fx: Being followed by orthopedics and PCP. She is also seeing pain management physician. She is advised to take deep breaths and cough despite discomfort associated.   4. [name]: Followed by vascular. Most recent CT scan 09/04/2017 demonstrated stable 4.0 cm aneurysm. No evidence for rupture or hemorrhage.      Current medicines are reviewed at length with the patient today.    Labs/ tests ordered today include: None Phill Myron. [place], ANP, AACC   10/11/2017 9:35 AM    [facility] [address],  [place], [place] [address] Phone: [phone]; Fax: [phone]

## 2017-10-11 ENCOUNTER — Ambulatory Visit (INDEPENDENT_AMBULATORY_CARE_PROVIDER_SITE_OTHER): Payer: Medicare Other | Admitting: Adult Health

## 2017-10-11 ENCOUNTER — Encounter: Payer: Self-pay | Admitting: Adult Health

## 2017-10-11 VITALS — BP 106/73 | HR 97 | Ht 60.0 in | Wt 170.0 lb

## 2017-10-11 DIAGNOSIS — I6381 Other cerebral infarction due to occlusion or stenosis of small artery: Secondary | ICD-10-CM

## 2017-10-11 DIAGNOSIS — I714 Abdominal aortic aneurysm, without rupture, unspecified: Secondary | ICD-10-CM

## 2017-10-11 DIAGNOSIS — M8008XD Age-related osteoporosis with current pathological fracture, vertebra(e), subsequent encounter for fracture with routine healing: Secondary | ICD-10-CM | POA: Diagnosis not present

## 2017-10-11 DIAGNOSIS — G629 Polyneuropathy, unspecified: Secondary | ICD-10-CM | POA: Diagnosis not present

## 2017-10-11 DIAGNOSIS — I10 Essential (primary) hypertension: Secondary | ICD-10-CM

## 2017-10-11 DIAGNOSIS — I482 Chronic atrial fibrillation, unspecified: Secondary | ICD-10-CM

## 2017-10-11 DIAGNOSIS — M069 Rheumatoid arthritis, unspecified: Secondary | ICD-10-CM | POA: Diagnosis not present

## 2017-10-11 NOTE — Patient Instructions (Signed)
Medication Instructions:  NO CHANGES- Your physician recommends that you continue on your current medications as directed. Please refer to the Current Medication list given to you today.  Follow-Up: Your physician wants you to follow-up in: [place].    Thank you for choosing [facility] at [facility]!!

## 2017-10-12 DIAGNOSIS — I482 Chronic atrial fibrillation: Secondary | ICD-10-CM | POA: Diagnosis not present

## 2017-10-12 DIAGNOSIS — M8008XD Age-related osteoporosis with current pathological fracture, vertebra(e), subsequent encounter for fracture with routine healing: Secondary | ICD-10-CM | POA: Diagnosis not present

## 2017-10-12 DIAGNOSIS — G629 Polyneuropathy, unspecified: Secondary | ICD-10-CM | POA: Diagnosis not present

## 2017-10-12 DIAGNOSIS — I714 Abdominal aortic aneurysm, without rupture: Secondary | ICD-10-CM | POA: Diagnosis not present

## 2017-10-12 DIAGNOSIS — M069 Rheumatoid arthritis, unspecified: Secondary | ICD-10-CM | POA: Diagnosis not present

## 2017-10-12 DIAGNOSIS — I10 Essential (primary) hypertension: Secondary | ICD-10-CM | POA: Diagnosis not present

## 2017-10-16 DIAGNOSIS — I714 Abdominal aortic aneurysm, without rupture: Secondary | ICD-10-CM | POA: Diagnosis not present

## 2017-10-16 DIAGNOSIS — M8008XD Age-related osteoporosis with current pathological fracture, vertebra(e), subsequent encounter for fracture with routine healing: Secondary | ICD-10-CM | POA: Diagnosis not present

## 2017-10-16 DIAGNOSIS — G629 Polyneuropathy, unspecified: Secondary | ICD-10-CM | POA: Diagnosis not present

## 2017-10-16 DIAGNOSIS — I10 Essential (primary) hypertension: Secondary | ICD-10-CM | POA: Diagnosis not present

## 2017-10-16 DIAGNOSIS — I482 Chronic atrial fibrillation: Secondary | ICD-10-CM | POA: Diagnosis not present

## 2017-10-16 DIAGNOSIS — M069 Rheumatoid arthritis, unspecified: Secondary | ICD-10-CM | POA: Diagnosis not present

## 2017-10-17 DIAGNOSIS — M069 Rheumatoid arthritis, unspecified: Secondary | ICD-10-CM | POA: Diagnosis not present

## 2017-10-17 DIAGNOSIS — I482 Chronic atrial fibrillation: Secondary | ICD-10-CM | POA: Diagnosis not present

## 2017-10-17 DIAGNOSIS — G629 Polyneuropathy, unspecified: Secondary | ICD-10-CM | POA: Diagnosis not present

## 2017-10-17 DIAGNOSIS — I10 Essential (primary) hypertension: Secondary | ICD-10-CM | POA: Diagnosis not present

## 2017-10-17 DIAGNOSIS — M8008XD Age-related osteoporosis with current pathological fracture, vertebra(e), subsequent encounter for fracture with routine healing: Secondary | ICD-10-CM | POA: Diagnosis not present

## 2017-10-17 DIAGNOSIS — I714 Abdominal aortic aneurysm, without rupture: Secondary | ICD-10-CM | POA: Diagnosis not present

## 2017-10-18 DIAGNOSIS — I714 Abdominal aortic aneurysm, without rupture: Secondary | ICD-10-CM | POA: Diagnosis not present

## 2017-10-18 DIAGNOSIS — I482 Chronic atrial fibrillation: Secondary | ICD-10-CM | POA: Diagnosis not present

## 2017-10-18 DIAGNOSIS — G629 Polyneuropathy, unspecified: Secondary | ICD-10-CM | POA: Diagnosis not present

## 2017-10-18 DIAGNOSIS — I10 Essential (primary) hypertension: Secondary | ICD-10-CM | POA: Diagnosis not present

## 2017-10-18 DIAGNOSIS — M8008XD Age-related osteoporosis with current pathological fracture, vertebra(e), subsequent encounter for fracture with routine healing: Secondary | ICD-10-CM | POA: Diagnosis not present

## 2017-10-18 DIAGNOSIS — M069 Rheumatoid arthritis, unspecified: Secondary | ICD-10-CM | POA: Diagnosis not present

## 2017-10-19 ENCOUNTER — Telehealth: Payer: Self-pay | Admitting: Family Medicine

## 2017-10-19 ENCOUNTER — Other Ambulatory Visit: Payer: Self-pay | Admitting: Family Medicine

## 2017-10-19 DIAGNOSIS — G629 Polyneuropathy, unspecified: Secondary | ICD-10-CM | POA: Diagnosis not present

## 2017-10-19 DIAGNOSIS — I10 Essential (primary) hypertension: Secondary | ICD-10-CM | POA: Diagnosis not present

## 2017-10-19 DIAGNOSIS — I714 Abdominal aortic aneurysm, without rupture: Secondary | ICD-10-CM | POA: Diagnosis not present

## 2017-10-19 DIAGNOSIS — I482 Chronic atrial fibrillation: Secondary | ICD-10-CM | POA: Diagnosis not present

## 2017-10-19 DIAGNOSIS — M069 Rheumatoid arthritis, unspecified: Secondary | ICD-10-CM | POA: Diagnosis not present

## 2017-10-19 DIAGNOSIS — M8008XD Age-related osteoporosis with current pathological fracture, vertebra(e), subsequent encounter for fracture with routine healing: Secondary | ICD-10-CM | POA: Diagnosis not present

## 2017-10-19 DIAGNOSIS — R8290 Unspecified abnormal findings in urine: Secondary | ICD-10-CM

## 2017-10-19 DIAGNOSIS — R300 Dysuria: Secondary | ICD-10-CM

## 2017-10-19 NOTE — Telephone Encounter (Signed)
[place] from [facility] called and states that pt is still having some burning with urination along with odor.   Per [facility] need UA & UC.  Informed Crystal of recommendations and she will bring urine to [place] to test.

## 2017-10-20 ENCOUNTER — Other Ambulatory Visit: Payer: Self-pay | Admitting: Family Medicine

## 2017-10-20 ENCOUNTER — Other Ambulatory Visit: Payer: Medicare Other

## 2017-10-20 DIAGNOSIS — I482 Chronic atrial fibrillation: Secondary | ICD-10-CM | POA: Diagnosis not present

## 2017-10-20 DIAGNOSIS — M069 Rheumatoid arthritis, unspecified: Secondary | ICD-10-CM | POA: Diagnosis not present

## 2017-10-20 DIAGNOSIS — R300 Dysuria: Secondary | ICD-10-CM

## 2017-10-20 DIAGNOSIS — I10 Essential (primary) hypertension: Secondary | ICD-10-CM | POA: Diagnosis not present

## 2017-10-20 DIAGNOSIS — M8008XD Age-related osteoporosis with current pathological fracture, vertebra(e), subsequent encounter for fracture with routine healing: Secondary | ICD-10-CM | POA: Diagnosis not present

## 2017-10-20 DIAGNOSIS — I714 Abdominal aortic aneurysm, without rupture: Secondary | ICD-10-CM | POA: Diagnosis not present

## 2017-10-20 DIAGNOSIS — R8290 Unspecified abnormal findings in urine: Secondary | ICD-10-CM

## 2017-10-20 DIAGNOSIS — G629 Polyneuropathy, unspecified: Secondary | ICD-10-CM | POA: Diagnosis not present

## 2017-10-20 LAB — URINALYSIS, ROUTINE W REFLEX MICROSCOPIC
Bilirubin Urine: NEGATIVE
Glucose, UA: NEGATIVE
Ketones, ur: NEGATIVE
Nitrite: POSITIVE — AB
Protein, ur: NEGATIVE
Specific Gravity, Urine: 1.015 (ref 1.001–1.03)
pH: 6 (ref 5.0–8.0)

## 2017-10-20 LAB — MICROSCOPIC MESSAGE

## 2017-10-20 MED ORDER — NITROFURANTOIN MONOHYD MACRO 100 MG PO CAPS: 100.0000 mg | ORAL_CAPSULE | Freq: Two times a day (BID) | ORAL | 0 refills | Status: DC

## 2017-10-22 LAB — URINE CULTURE
MICRO NUMBER:: 90745428
SPECIMEN QUALITY:: ADEQUATE

## 2017-10-23 DIAGNOSIS — I482 Chronic atrial fibrillation: Secondary | ICD-10-CM | POA: Diagnosis not present

## 2017-10-23 DIAGNOSIS — I10 Essential (primary) hypertension: Secondary | ICD-10-CM | POA: Diagnosis not present

## 2017-10-23 DIAGNOSIS — M069 Rheumatoid arthritis, unspecified: Secondary | ICD-10-CM | POA: Diagnosis not present

## 2017-10-23 DIAGNOSIS — M8008XD Age-related osteoporosis with current pathological fracture, vertebra(e), subsequent encounter for fracture with routine healing: Secondary | ICD-10-CM | POA: Diagnosis not present

## 2017-10-23 DIAGNOSIS — G629 Polyneuropathy, unspecified: Secondary | ICD-10-CM | POA: Diagnosis not present

## 2017-10-23 DIAGNOSIS — I714 Abdominal aortic aneurysm, without rupture: Secondary | ICD-10-CM | POA: Diagnosis not present

## 2017-10-24 DIAGNOSIS — I714 Abdominal aortic aneurysm, without rupture: Secondary | ICD-10-CM | POA: Diagnosis not present

## 2017-10-24 DIAGNOSIS — M8008XD Age-related osteoporosis with current pathological fracture, vertebra(e), subsequent encounter for fracture with routine healing: Secondary | ICD-10-CM | POA: Diagnosis not present

## 2017-10-24 DIAGNOSIS — G629 Polyneuropathy, unspecified: Secondary | ICD-10-CM | POA: Diagnosis not present

## 2017-10-24 DIAGNOSIS — M069 Rheumatoid arthritis, unspecified: Secondary | ICD-10-CM | POA: Diagnosis not present

## 2017-10-24 DIAGNOSIS — I482 Chronic atrial fibrillation: Secondary | ICD-10-CM | POA: Diagnosis not present

## 2017-10-24 DIAGNOSIS — I10 Essential (primary) hypertension: Secondary | ICD-10-CM | POA: Diagnosis not present

## 2017-10-26 DIAGNOSIS — I10 Essential (primary) hypertension: Secondary | ICD-10-CM | POA: Diagnosis not present

## 2017-10-26 DIAGNOSIS — I714 Abdominal aortic aneurysm, without rupture: Secondary | ICD-10-CM | POA: Diagnosis not present

## 2017-10-26 DIAGNOSIS — G629 Polyneuropathy, unspecified: Secondary | ICD-10-CM | POA: Diagnosis not present

## 2017-10-26 DIAGNOSIS — I482 Chronic atrial fibrillation: Secondary | ICD-10-CM | POA: Diagnosis not present

## 2017-10-26 DIAGNOSIS — M8008XD Age-related osteoporosis with current pathological fracture, vertebra(e), subsequent encounter for fracture with routine healing: Secondary | ICD-10-CM | POA: Diagnosis not present

## 2017-10-26 DIAGNOSIS — M069 Rheumatoid arthritis, unspecified: Secondary | ICD-10-CM | POA: Diagnosis not present

## 2017-10-27 ENCOUNTER — Telehealth: Payer: Self-pay | Admitting: Family Medicine

## 2017-10-27 DIAGNOSIS — S22080A Wedge compression fracture of T11-T12 vertebra, initial encounter for closed fracture: Secondary | ICD-10-CM

## 2017-10-27 NOTE — Telephone Encounter (Signed)
[name] called from [facility] and the pt had questions about who she needs to see for her back fracture? (ie - ortho, neuro).  Please call pt an inform pt as well as [name]. (aware you are out of town and will receive a call on Monday)

## 2017-10-30 DIAGNOSIS — I10 Essential (primary) hypertension: Secondary | ICD-10-CM | POA: Diagnosis not present

## 2017-10-30 DIAGNOSIS — M069 Rheumatoid arthritis, unspecified: Secondary | ICD-10-CM | POA: Diagnosis not present

## 2017-10-30 DIAGNOSIS — I482 Chronic atrial fibrillation: Secondary | ICD-10-CM | POA: Diagnosis not present

## 2017-10-30 DIAGNOSIS — M8008XD Age-related osteoporosis with current pathological fracture, vertebra(e), subsequent encounter for fracture with routine healing: Secondary | ICD-10-CM | POA: Diagnosis not present

## 2017-10-30 DIAGNOSIS — I714 Abdominal aortic aneurysm, without rupture: Secondary | ICD-10-CM | POA: Diagnosis not present

## 2017-10-30 DIAGNOSIS — G629 Polyneuropathy, unspecified: Secondary | ICD-10-CM | POA: Diagnosis not present

## 2017-10-30 NOTE — Telephone Encounter (Signed)
CT of abd in may revealed:  New fracture of the T12 vertebral body. Inferior endplate of Q22 is mildly displaced. Minimal bone retropulsion at this level.   Could see neurosurgery OR Dr. Estanislado Pandy with interventional radiology if they are considering kyphoplasty for pain. Is that what they are considering.

## 2017-10-31 ENCOUNTER — Ambulatory Visit (INDEPENDENT_AMBULATORY_CARE_PROVIDER_SITE_OTHER): Payer: Medicare Other | Admitting: Podiatry

## 2017-10-31 DIAGNOSIS — I6381 Other cerebral infarction due to occlusion or stenosis of small artery: Secondary | ICD-10-CM | POA: Diagnosis not present

## 2017-10-31 DIAGNOSIS — I482 Chronic atrial fibrillation: Secondary | ICD-10-CM | POA: Diagnosis not present

## 2017-10-31 DIAGNOSIS — G629 Polyneuropathy, unspecified: Secondary | ICD-10-CM | POA: Diagnosis not present

## 2017-10-31 DIAGNOSIS — E1142 Type 2 diabetes mellitus with diabetic polyneuropathy: Secondary | ICD-10-CM | POA: Diagnosis not present

## 2017-10-31 DIAGNOSIS — M778 Other enthesopathies, not elsewhere classified: Secondary | ICD-10-CM

## 2017-10-31 DIAGNOSIS — M8008XD Age-related osteoporosis with current pathological fracture, vertebra(e), subsequent encounter for fracture with routine healing: Secondary | ICD-10-CM | POA: Diagnosis not present

## 2017-10-31 DIAGNOSIS — B351 Tinea unguium: Secondary | ICD-10-CM

## 2017-10-31 DIAGNOSIS — I10 Essential (primary) hypertension: Secondary | ICD-10-CM | POA: Diagnosis not present

## 2017-10-31 DIAGNOSIS — M779 Enthesopathy, unspecified: Secondary | ICD-10-CM | POA: Diagnosis not present

## 2017-10-31 DIAGNOSIS — Q828 Other specified congenital malformations of skin: Secondary | ICD-10-CM | POA: Diagnosis not present

## 2017-10-31 DIAGNOSIS — I714 Abdominal aortic aneurysm, without rupture: Secondary | ICD-10-CM | POA: Diagnosis not present

## 2017-10-31 DIAGNOSIS — M069 Rheumatoid arthritis, unspecified: Secondary | ICD-10-CM | POA: Diagnosis not present

## 2017-10-31 DIAGNOSIS — M79676 Pain in unspecified toe(s): Secondary | ICD-10-CM

## 2017-10-31 NOTE — Progress Notes (Signed)
She presents today chief complaint of painful elongated toenails bilaterally.  She is also complaining of painful second metatarsal phalangeal joint of the left foot and painful second toe left.  Objective: Vital signs are stable alert and oriented x3.  Pulses are palpable.  Toenails are long thick yellow dystrophic like mycotic she has pain on palpation and range of motion of the second metatarsophalangeal joint of the left toe second.  Assessment: Pain in limb secondary to onychomycosis and capsulitis.  Plan: After sterile Betadine skin prep I injected 10 mg Kenalog 5 mg Marcaine around the second metatarsophalangeal joint left also debrided toenails 1 through 5 bilateral.

## 2017-11-01 DIAGNOSIS — M069 Rheumatoid arthritis, unspecified: Secondary | ICD-10-CM | POA: Diagnosis not present

## 2017-11-01 DIAGNOSIS — M8008XD Age-related osteoporosis with current pathological fracture, vertebra(e), subsequent encounter for fracture with routine healing: Secondary | ICD-10-CM | POA: Diagnosis not present

## 2017-11-01 DIAGNOSIS — I10 Essential (primary) hypertension: Secondary | ICD-10-CM | POA: Diagnosis not present

## 2017-11-01 DIAGNOSIS — I482 Chronic atrial fibrillation: Secondary | ICD-10-CM | POA: Diagnosis not present

## 2017-11-01 DIAGNOSIS — G629 Polyneuropathy, unspecified: Secondary | ICD-10-CM | POA: Diagnosis not present

## 2017-11-01 DIAGNOSIS — I714 Abdominal aortic aneurysm, without rupture: Secondary | ICD-10-CM | POA: Diagnosis not present

## 2017-11-04 DIAGNOSIS — M8008XD Age-related osteoporosis with current pathological fracture, vertebra(e), subsequent encounter for fracture with routine healing: Secondary | ICD-10-CM | POA: Diagnosis not present

## 2017-11-04 DIAGNOSIS — M069 Rheumatoid arthritis, unspecified: Secondary | ICD-10-CM | POA: Diagnosis not present

## 2017-11-04 DIAGNOSIS — I10 Essential (primary) hypertension: Secondary | ICD-10-CM | POA: Diagnosis not present

## 2017-11-04 DIAGNOSIS — G629 Polyneuropathy, unspecified: Secondary | ICD-10-CM | POA: Diagnosis not present

## 2017-11-04 DIAGNOSIS — I482 Chronic atrial fibrillation: Secondary | ICD-10-CM | POA: Diagnosis not present

## 2017-11-04 DIAGNOSIS — I714 Abdominal aortic aneurysm, without rupture: Secondary | ICD-10-CM | POA: Diagnosis not present

## 2017-11-06 DIAGNOSIS — M069 Rheumatoid arthritis, unspecified: Secondary | ICD-10-CM | POA: Diagnosis not present

## 2017-11-06 DIAGNOSIS — M8008XD Age-related osteoporosis with current pathological fracture, vertebra(e), subsequent encounter for fracture with routine healing: Secondary | ICD-10-CM | POA: Diagnosis not present

## 2017-11-06 DIAGNOSIS — I714 Abdominal aortic aneurysm, without rupture: Secondary | ICD-10-CM | POA: Diagnosis not present

## 2017-11-06 DIAGNOSIS — I482 Chronic atrial fibrillation: Secondary | ICD-10-CM | POA: Diagnosis not present

## 2017-11-06 DIAGNOSIS — I10 Essential (primary) hypertension: Secondary | ICD-10-CM | POA: Diagnosis not present

## 2017-11-06 DIAGNOSIS — G629 Polyneuropathy, unspecified: Secondary | ICD-10-CM | POA: Diagnosis not present

## 2017-11-07 DIAGNOSIS — G629 Polyneuropathy, unspecified: Secondary | ICD-10-CM | POA: Diagnosis not present

## 2017-11-07 DIAGNOSIS — M069 Rheumatoid arthritis, unspecified: Secondary | ICD-10-CM | POA: Diagnosis not present

## 2017-11-07 DIAGNOSIS — M8008XD Age-related osteoporosis with current pathological fracture, vertebra(e), subsequent encounter for fracture with routine healing: Secondary | ICD-10-CM | POA: Diagnosis not present

## 2017-11-07 DIAGNOSIS — I714 Abdominal aortic aneurysm, without rupture: Secondary | ICD-10-CM | POA: Diagnosis not present

## 2017-11-07 DIAGNOSIS — I10 Essential (primary) hypertension: Secondary | ICD-10-CM | POA: Diagnosis not present

## 2017-11-07 DIAGNOSIS — I482 Chronic atrial fibrillation: Secondary | ICD-10-CM | POA: Diagnosis not present

## 2017-11-09 DIAGNOSIS — I482 Chronic atrial fibrillation: Secondary | ICD-10-CM | POA: Diagnosis not present

## 2017-11-09 DIAGNOSIS — I10 Essential (primary) hypertension: Secondary | ICD-10-CM | POA: Diagnosis not present

## 2017-11-09 DIAGNOSIS — M069 Rheumatoid arthritis, unspecified: Secondary | ICD-10-CM | POA: Diagnosis not present

## 2017-11-09 DIAGNOSIS — M8008XD Age-related osteoporosis with current pathological fracture, vertebra(e), subsequent encounter for fracture with routine healing: Secondary | ICD-10-CM | POA: Diagnosis not present

## 2017-11-09 DIAGNOSIS — G629 Polyneuropathy, unspecified: Secondary | ICD-10-CM | POA: Diagnosis not present

## 2017-11-09 DIAGNOSIS — I714 Abdominal aortic aneurysm, without rupture: Secondary | ICD-10-CM | POA: Diagnosis not present

## 2017-11-09 NOTE — Telephone Encounter (Signed)
Have tried to call several time but son apparently can not hear me talking - will keep trying.

## 2017-11-13 ENCOUNTER — Emergency Department (HOSPITAL_COMMUNITY): Payer: Medicare Other

## 2017-11-13 ENCOUNTER — Other Ambulatory Visit: Payer: Self-pay

## 2017-11-13 ENCOUNTER — Emergency Department (HOSPITAL_COMMUNITY)
Admission: EM | Admit: 2017-11-13 | Discharge: 2017-11-13 | Disposition: A | Discharge status: Home / Self Care | Payer: Medicare Other | Attending: Emergency Medicine | Admitting: Emergency Medicine

## 2017-11-13 ENCOUNTER — Encounter (HOSPITAL_COMMUNITY): Payer: Self-pay | Admitting: Emergency Medicine

## 2017-11-13 DIAGNOSIS — F0390 Unspecified dementia without behavioral disturbance: Secondary | ICD-10-CM | POA: Diagnosis not present

## 2017-11-13 DIAGNOSIS — Z7982 Long term (current) use of aspirin: Secondary | ICD-10-CM | POA: Insufficient documentation

## 2017-11-13 DIAGNOSIS — Z96651 Presence of right artificial knee joint: Secondary | ICD-10-CM | POA: Diagnosis not present

## 2017-11-13 DIAGNOSIS — I10 Essential (primary) hypertension: Secondary | ICD-10-CM | POA: Insufficient documentation

## 2017-11-13 DIAGNOSIS — Z79899 Other long term (current) drug therapy: Secondary | ICD-10-CM | POA: Diagnosis not present

## 2017-11-13 DIAGNOSIS — M19011 Primary osteoarthritis, right shoulder: Secondary | ICD-10-CM

## 2017-11-13 DIAGNOSIS — M25511 Pain in right shoulder: Secondary | ICD-10-CM | POA: Diagnosis not present

## 2017-11-13 DIAGNOSIS — I4891 Unspecified atrial fibrillation: Secondary | ICD-10-CM | POA: Diagnosis not present

## 2017-11-13 NOTE — ED Triage Notes (Signed)
Pt c/o right shoulder pain that radiates to neck and jaw for couple days. Pt reports pain is worse with movement.  Pt family reports that when called PCP to make appt was instructed to go to ED bc pain could be related to her A fib.

## 2017-11-13 NOTE — ED Provider Notes (Signed)
[facility] DEPT Provider Note   CSN: [id number] Arrival date & time: 11/13/17  1152     History   Chief Complaint Chief Complaint  Patient presents with  . Shoulder Pain  . Neck Pain  . Jaw Pain    HPI [name] is a 84 y.o. female.  84 year old female presents with several day history of right shoulder pain characterizes dull and worse with movements.  Denies any prior history of trauma but does have a history of rheumatoid arthritis.  No fever or chills.  Denies any anginal type chest discomfort.  She has not been dyspneic.  No rashes around the joint itself.  Has not been warm to touch.  Symptoms worse with movement and better with rest.  Called her doctor and was told to come in for further evaluation.     Past Medical History:  Diagnosis Date  . [name] (abdominal aortic aneurysm) ([place]) 09/2013   3.6 cm  . Atrial fibrillation ([place])   . Dementia   . Diabetes mellitus    pre-diabetes  . Diverticulosis   . Hyperlipidemia   . Hypertension   . Nephrolithiasis   . OSA on CPAP   . Osteoarthritis   . Osteopenia    T=  -2.1 in hip  . Rheumatoid arthritis(714.0)   . S/P carpal tunnel release   . Spinal stenosis     Patient Active Problem List   Diagnosis Date Noted  . Fall   . T12 compression fracture ([place]) 09/04/2017  . UTI (urinary tract infection) 07/03/2017  . Fall at home, initial encounter 07/03/2017  . A-fib ([place]) 07/03/2017  . Tachycardia 02/03/2017  . Sepsis secondary to UTI ([place]) 02/02/2017  . Chronic pain syndrome 02/02/2017  . Abnormal EKG 11/21/2013  . [name] (abdominal aortic aneurysm) ([place]) 09/30/2013  . OSA (obstructive sleep apnea) 06/29/2012  . Dementia 06/29/2012  . Pre-diabetes 06/29/2012  . Hypoxia, sleep related 06/29/2012  . RA (rheumatoid arthritis) ([place]) 06/29/2012  . Nonspecific abnormal results of cardiovascular function study 06/02/2011  . Nonspecific abnormal electrocardiogram (ECG) (EKG) 05/18/2011    . OSTEOPOROSIS 04/09/2008  . FLANK PAIN, RIGHT 03/04/2008  . Hyperlipidemia 05/12/2006  . PYELONEPHRITIS, ACUTE 05/12/2006  . OSTEOARTHRITIS 05/12/2006  . DEPENDENT EDEMA, LEGS, BILATERAL 05/12/2006  . URINARY INCONTINENCE 05/12/2006  . PERIPHERAL NEUROPATHY 03/11/2006  . Essential hypertension 03/11/2006  . DYSURIA 03/11/2006    Past Surgical History:  Procedure Laterality Date  . APPENDECTOMY    . CARPAL TUNNEL RELEASE  12/27/2006   Right subcutaneous ulnar nerve transfer -- Right open carpal tunnel release  . CHOLECYSTECTOMY    . FOOT SURGERY    . JOINT REPLACEMENT     BTKR, RTHR  . TOTAL KNEE ARTHROPLASTY  08/21/2007   Right total knee replacement     OB History   None      Home Medications    Prior to Admission medications   Medication Sig Start Date End Date Taking? Authorizing Provider  aspirin 81 MG chewable tablet Chew 81 mg by mouth daily.   Yes [provider]  atenolol (TENORMIN) 50 MG tablet Take 1 tablet (50 mg total) by mouth daily. 08/24/17  Yes [name], MD  DULoxetine (CYMBALTA) 30 MG capsule Take 30 mg by mouth 3 (three) times daily.  07/29/14  Yes [provider]  gabapentin (NEURONTIN) 800 MG tablet TAKE 1 TABLET BY MOUTH EVERY MORNING AND AT LUNCH, 2 TABLETS BY MOUTH EVERY NIGHT AT BEDTIME 10/05/16  Yes Hyatt, Max T, DPM  oxyCODONE-acetaminophen (PERCOCET/ROXICET) 5-325 MG tablet Take 1 tablet by mouth every 4 (four) hours as needed for severe pain.   Yes [provider]  oxymorphone (OPANA) 5 MG tablet Take 1 tablet (5 mg total) by mouth 3 (three) times daily. 09/08/17  Yes Charlynne Cousins, MD  rosuvastatin (CRESTOR) 40 MG tablet Take 1 tablet (40 mg total) by mouth daily. 01/05/17  Yes [name], MD    Family History Family History  Problem Relation Age of Onset  . Stroke Father   . Coronary artery disease Son        CABG at age 59  . Heart disease Son   . Heart attack Son   . Stroke Mother   .  Hypertension Sister   . Diabetes Brother   . Hypertension Maternal Aunt     Social History Social History   Tobacco Use  . Smoking status: Never Smoker  . Smokeless tobacco: Never Used  Substance Use Topics  . Alcohol use: No  . Drug use: No     Allergies   Sulfonamide derivatives   Review of Systems Review of Systems  All other systems reviewed and are negative.    Physical Exam Updated Vital Signs BP 110/84 (BP Location: Left Arm)   Pulse 79   Temp 97.7 F (36.5 C) (Oral)   Resp 11   SpO2 94%   Physical Exam  Constitutional: She is oriented to person, place, and time. She appears well-developed and well-nourished.  Non-toxic appearance. No distress.  HENT:  Head: Normocephalic and atraumatic.  Eyes: Pupils are equal, round, and reactive to light. Conjunctivae, EOM and lids are normal.  Neck: Normal range of motion. Neck supple. No tracheal deviation present. No thyroid mass present.  Cardiovascular: Normal rate, regular rhythm and normal heart sounds. Exam reveals no gallop.  No murmur heard. Pulmonary/Chest: Effort normal and breath sounds normal. No stridor. No respiratory distress. She has no decreased breath sounds. She has no wheezes. She has no rhonchi. She has no rales.  Abdominal: Soft. Normal appearance and bowel sounds are normal. She exhibits no distension. There is no tenderness. There is no rebound and no CVA tenderness.  Musculoskeletal: Normal range of motion. She exhibits no edema.       Right shoulder: She exhibits tenderness and pain. She exhibits no swelling and no deformity.       Arms: Neurological: She is alert and oriented to person, place, and time. She has normal strength. No cranial nerve deficit or sensory deficit. GCS eye subscore is 4. GCS verbal subscore is 5. GCS motor subscore is 6.  Skin: Skin is warm and dry. No abrasion and no rash noted.  Psychiatric: She has a normal mood and affect. Her speech is normal and behavior is normal.   Nursing note and vitals reviewed.    ED Treatments / Results  Labs (all labs ordered are listed, but only abnormal results are displayed) Labs Reviewed - No data to display  EKG EKG Interpretation  Date/Time:  Monday November 13 2017 12:02:19 EDT Ventricular Rate:  80 PR Interval:    QRS Duration: 80 QT Interval:  344 QTC Calculation: 397 R Axis:   9 Text Interpretation:  Atrial fibrillation Low voltage, precordial leads No significant change since last tracing Confirmed by Lacretia Leigh ([id number]) on 11/13/2017 1:19:41 PM   Radiology No results found.  Procedures Procedures (including critical care time)  Medications Ordered in ED Medications - No  data to display   Initial Impression / Assessment and Plan / ED Course  I have reviewed the triage vital signs and the nursing notes.  Pertinent labs & imaging results that were available during my care of the patient were reviewed by me and considered in my medical decision making (see chart for details).     X-ray of right shoulder consistent with arthritis.  Patient has no history of rheumatoid arthritis.  Patient follow-up with her doctor  Final Clinical Impressions(s) / ED Diagnoses   Final diagnoses:  None    ED Discharge Orders    None       Lacretia Leigh, MD 11/13/17 1459

## 2017-11-13 NOTE — ED Notes (Signed)
Pts son signed for pt

## 2017-11-14 DIAGNOSIS — I10 Essential (primary) hypertension: Secondary | ICD-10-CM | POA: Diagnosis not present

## 2017-11-14 DIAGNOSIS — G629 Polyneuropathy, unspecified: Secondary | ICD-10-CM | POA: Diagnosis not present

## 2017-11-14 DIAGNOSIS — M8008XD Age-related osteoporosis with current pathological fracture, vertebra(e), subsequent encounter for fracture with routine healing: Secondary | ICD-10-CM | POA: Diagnosis not present

## 2017-11-14 DIAGNOSIS — I482 Chronic atrial fibrillation: Secondary | ICD-10-CM | POA: Diagnosis not present

## 2017-11-14 DIAGNOSIS — M069 Rheumatoid arthritis, unspecified: Secondary | ICD-10-CM | POA: Diagnosis not present

## 2017-11-14 DIAGNOSIS — I714 Abdominal aortic aneurysm, without rupture: Secondary | ICD-10-CM | POA: Diagnosis not present

## 2017-11-15 DIAGNOSIS — I714 Abdominal aortic aneurysm, without rupture: Secondary | ICD-10-CM | POA: Diagnosis not present

## 2017-11-15 DIAGNOSIS — G629 Polyneuropathy, unspecified: Secondary | ICD-10-CM | POA: Diagnosis not present

## 2017-11-15 DIAGNOSIS — M8008XD Age-related osteoporosis with current pathological fracture, vertebra(e), subsequent encounter for fracture with routine healing: Secondary | ICD-10-CM | POA: Diagnosis not present

## 2017-11-15 DIAGNOSIS — I482 Chronic atrial fibrillation: Secondary | ICD-10-CM | POA: Diagnosis not present

## 2017-11-15 DIAGNOSIS — I10 Essential (primary) hypertension: Secondary | ICD-10-CM | POA: Diagnosis not present

## 2017-11-15 DIAGNOSIS — M069 Rheumatoid arthritis, unspecified: Secondary | ICD-10-CM | POA: Diagnosis not present

## 2017-11-16 DIAGNOSIS — M8008XD Age-related osteoporosis with current pathological fracture, vertebra(e), subsequent encounter for fracture with routine healing: Secondary | ICD-10-CM | POA: Diagnosis not present

## 2017-11-16 DIAGNOSIS — M069 Rheumatoid arthritis, unspecified: Secondary | ICD-10-CM | POA: Diagnosis not present

## 2017-11-16 DIAGNOSIS — I10 Essential (primary) hypertension: Secondary | ICD-10-CM | POA: Diagnosis not present

## 2017-11-16 DIAGNOSIS — G629 Polyneuropathy, unspecified: Secondary | ICD-10-CM | POA: Diagnosis not present

## 2017-11-16 DIAGNOSIS — I482 Chronic atrial fibrillation: Secondary | ICD-10-CM | POA: Diagnosis not present

## 2017-11-16 DIAGNOSIS — I714 Abdominal aortic aneurysm, without rupture: Secondary | ICD-10-CM | POA: Diagnosis not present

## 2017-11-20 DIAGNOSIS — I482 Chronic atrial fibrillation: Secondary | ICD-10-CM | POA: Diagnosis not present

## 2017-11-20 DIAGNOSIS — I714 Abdominal aortic aneurysm, without rupture: Secondary | ICD-10-CM | POA: Diagnosis not present

## 2017-11-20 DIAGNOSIS — M8008XD Age-related osteoporosis with current pathological fracture, vertebra(e), subsequent encounter for fracture with routine healing: Secondary | ICD-10-CM | POA: Diagnosis not present

## 2017-11-20 DIAGNOSIS — I10 Essential (primary) hypertension: Secondary | ICD-10-CM | POA: Diagnosis not present

## 2017-11-20 DIAGNOSIS — G629 Polyneuropathy, unspecified: Secondary | ICD-10-CM | POA: Diagnosis not present

## 2017-11-20 DIAGNOSIS — M069 Rheumatoid arthritis, unspecified: Secondary | ICD-10-CM | POA: Diagnosis not present

## 2017-11-21 DIAGNOSIS — M8008XD Age-related osteoporosis with current pathological fracture, vertebra(e), subsequent encounter for fracture with routine healing: Secondary | ICD-10-CM | POA: Diagnosis not present

## 2017-11-21 DIAGNOSIS — M069 Rheumatoid arthritis, unspecified: Secondary | ICD-10-CM | POA: Diagnosis not present

## 2017-11-21 DIAGNOSIS — I10 Essential (primary) hypertension: Secondary | ICD-10-CM | POA: Diagnosis not present

## 2017-11-21 DIAGNOSIS — G629 Polyneuropathy, unspecified: Secondary | ICD-10-CM | POA: Diagnosis not present

## 2017-11-21 DIAGNOSIS — I714 Abdominal aortic aneurysm, without rupture: Secondary | ICD-10-CM | POA: Diagnosis not present

## 2017-11-21 DIAGNOSIS — I482 Chronic atrial fibrillation: Secondary | ICD-10-CM | POA: Diagnosis not present

## 2017-11-22 DIAGNOSIS — M8008XD Age-related osteoporosis with current pathological fracture, vertebra(e), subsequent encounter for fracture with routine healing: Secondary | ICD-10-CM | POA: Diagnosis not present

## 2017-11-22 DIAGNOSIS — I482 Chronic atrial fibrillation: Secondary | ICD-10-CM | POA: Diagnosis not present

## 2017-11-22 DIAGNOSIS — I10 Essential (primary) hypertension: Secondary | ICD-10-CM | POA: Diagnosis not present

## 2017-11-22 DIAGNOSIS — G629 Polyneuropathy, unspecified: Secondary | ICD-10-CM | POA: Diagnosis not present

## 2017-11-22 DIAGNOSIS — M069 Rheumatoid arthritis, unspecified: Secondary | ICD-10-CM | POA: Diagnosis not present

## 2017-11-22 DIAGNOSIS — I714 Abdominal aortic aneurysm, without rupture: Secondary | ICD-10-CM | POA: Diagnosis not present

## 2017-11-23 DIAGNOSIS — M069 Rheumatoid arthritis, unspecified: Secondary | ICD-10-CM | POA: Diagnosis not present

## 2017-11-23 DIAGNOSIS — I10 Essential (primary) hypertension: Secondary | ICD-10-CM | POA: Diagnosis not present

## 2017-11-23 DIAGNOSIS — G629 Polyneuropathy, unspecified: Secondary | ICD-10-CM | POA: Diagnosis not present

## 2017-11-23 DIAGNOSIS — I482 Chronic atrial fibrillation: Secondary | ICD-10-CM | POA: Diagnosis not present

## 2017-11-23 DIAGNOSIS — I714 Abdominal aortic aneurysm, without rupture: Secondary | ICD-10-CM | POA: Diagnosis not present

## 2017-11-23 DIAGNOSIS — M8008XD Age-related osteoporosis with current pathological fracture, vertebra(e), subsequent encounter for fracture with routine healing: Secondary | ICD-10-CM | POA: Diagnosis not present

## 2017-11-24 DIAGNOSIS — G629 Polyneuropathy, unspecified: Secondary | ICD-10-CM | POA: Diagnosis not present

## 2017-11-24 DIAGNOSIS — M069 Rheumatoid arthritis, unspecified: Secondary | ICD-10-CM | POA: Diagnosis not present

## 2017-11-24 DIAGNOSIS — I10 Essential (primary) hypertension: Secondary | ICD-10-CM | POA: Diagnosis not present

## 2017-11-24 DIAGNOSIS — M8008XD Age-related osteoporosis with current pathological fracture, vertebra(e), subsequent encounter for fracture with routine healing: Secondary | ICD-10-CM | POA: Diagnosis not present

## 2017-11-24 DIAGNOSIS — I482 Chronic atrial fibrillation: Secondary | ICD-10-CM | POA: Diagnosis not present

## 2017-11-24 DIAGNOSIS — I714 Abdominal aortic aneurysm, without rupture: Secondary | ICD-10-CM | POA: Diagnosis not present

## 2017-11-26 DIAGNOSIS — I714 Abdominal aortic aneurysm, without rupture: Secondary | ICD-10-CM | POA: Diagnosis not present

## 2017-11-26 DIAGNOSIS — M069 Rheumatoid arthritis, unspecified: Secondary | ICD-10-CM | POA: Diagnosis not present

## 2017-11-26 DIAGNOSIS — I482 Chronic atrial fibrillation: Secondary | ICD-10-CM | POA: Diagnosis not present

## 2017-11-26 DIAGNOSIS — M8008XD Age-related osteoporosis with current pathological fracture, vertebra(e), subsequent encounter for fracture with routine healing: Secondary | ICD-10-CM | POA: Diagnosis not present

## 2017-11-26 DIAGNOSIS — I10 Essential (primary) hypertension: Secondary | ICD-10-CM | POA: Diagnosis not present

## 2017-11-26 DIAGNOSIS — G629 Polyneuropathy, unspecified: Secondary | ICD-10-CM | POA: Diagnosis not present

## 2017-11-28 DIAGNOSIS — M069 Rheumatoid arthritis, unspecified: Secondary | ICD-10-CM | POA: Diagnosis not present

## 2017-11-28 DIAGNOSIS — G629 Polyneuropathy, unspecified: Secondary | ICD-10-CM | POA: Diagnosis not present

## 2017-11-28 DIAGNOSIS — I482 Chronic atrial fibrillation: Secondary | ICD-10-CM | POA: Diagnosis not present

## 2017-11-28 DIAGNOSIS — I10 Essential (primary) hypertension: Secondary | ICD-10-CM | POA: Diagnosis not present

## 2017-11-28 DIAGNOSIS — M8008XD Age-related osteoporosis with current pathological fracture, vertebra(e), subsequent encounter for fracture with routine healing: Secondary | ICD-10-CM | POA: Diagnosis not present

## 2017-11-28 DIAGNOSIS — I714 Abdominal aortic aneurysm, without rupture: Secondary | ICD-10-CM | POA: Diagnosis not present

## 2017-11-30 DIAGNOSIS — M8008XD Age-related osteoporosis with current pathological fracture, vertebra(e), subsequent encounter for fracture with routine healing: Secondary | ICD-10-CM | POA: Diagnosis not present

## 2017-11-30 DIAGNOSIS — M069 Rheumatoid arthritis, unspecified: Secondary | ICD-10-CM | POA: Diagnosis not present

## 2017-11-30 DIAGNOSIS — I482 Chronic atrial fibrillation: Secondary | ICD-10-CM | POA: Diagnosis not present

## 2017-11-30 DIAGNOSIS — I714 Abdominal aortic aneurysm, without rupture: Secondary | ICD-10-CM | POA: Diagnosis not present

## 2017-11-30 DIAGNOSIS — G629 Polyneuropathy, unspecified: Secondary | ICD-10-CM | POA: Diagnosis not present

## 2017-11-30 DIAGNOSIS — I10 Essential (primary) hypertension: Secondary | ICD-10-CM | POA: Diagnosis not present

## 2017-12-01 DIAGNOSIS — G629 Polyneuropathy, unspecified: Secondary | ICD-10-CM | POA: Diagnosis not present

## 2017-12-01 DIAGNOSIS — I10 Essential (primary) hypertension: Secondary | ICD-10-CM | POA: Diagnosis not present

## 2017-12-01 DIAGNOSIS — I482 Chronic atrial fibrillation: Secondary | ICD-10-CM | POA: Diagnosis not present

## 2017-12-01 DIAGNOSIS — M8008XD Age-related osteoporosis with current pathological fracture, vertebra(e), subsequent encounter for fracture with routine healing: Secondary | ICD-10-CM | POA: Diagnosis not present

## 2017-12-01 DIAGNOSIS — M069 Rheumatoid arthritis, unspecified: Secondary | ICD-10-CM | POA: Diagnosis not present

## 2017-12-01 DIAGNOSIS — I714 Abdominal aortic aneurysm, without rupture: Secondary | ICD-10-CM | POA: Diagnosis not present

## 2017-12-01 NOTE — Telephone Encounter (Signed)
Spoke to Costella Hatcher OT for [facility] and was with pt and she is not sure which one she should see therefore will place referral for Neurosurgery to start. Pt aware and referral placed.

## 2017-12-01 NOTE — Addendum Note (Signed)
Addended by: Shary Decamp B on: 12/01/2017 09:48 AM   Modules accepted: Orders

## 2017-12-04 DIAGNOSIS — I482 Chronic atrial fibrillation: Secondary | ICD-10-CM | POA: Diagnosis not present

## 2017-12-04 DIAGNOSIS — I714 Abdominal aortic aneurysm, without rupture: Secondary | ICD-10-CM | POA: Diagnosis not present

## 2017-12-04 DIAGNOSIS — I10 Essential (primary) hypertension: Secondary | ICD-10-CM | POA: Diagnosis not present

## 2017-12-04 DIAGNOSIS — M069 Rheumatoid arthritis, unspecified: Secondary | ICD-10-CM | POA: Diagnosis not present

## 2017-12-04 DIAGNOSIS — G629 Polyneuropathy, unspecified: Secondary | ICD-10-CM | POA: Diagnosis not present

## 2017-12-04 DIAGNOSIS — M8008XD Age-related osteoporosis with current pathological fracture, vertebra(e), subsequent encounter for fracture with routine healing: Secondary | ICD-10-CM | POA: Diagnosis not present

## 2017-12-07 DIAGNOSIS — G629 Polyneuropathy, unspecified: Secondary | ICD-10-CM | POA: Diagnosis not present

## 2017-12-07 DIAGNOSIS — I10 Essential (primary) hypertension: Secondary | ICD-10-CM | POA: Diagnosis not present

## 2017-12-07 DIAGNOSIS — M069 Rheumatoid arthritis, unspecified: Secondary | ICD-10-CM | POA: Diagnosis not present

## 2017-12-07 DIAGNOSIS — I714 Abdominal aortic aneurysm, without rupture: Secondary | ICD-10-CM | POA: Diagnosis not present

## 2017-12-07 DIAGNOSIS — I482 Chronic atrial fibrillation: Secondary | ICD-10-CM | POA: Diagnosis not present

## 2017-12-07 DIAGNOSIS — M8008XD Age-related osteoporosis with current pathological fracture, vertebra(e), subsequent encounter for fracture with routine healing: Secondary | ICD-10-CM | POA: Diagnosis not present

## 2017-12-08 DIAGNOSIS — I714 Abdominal aortic aneurysm, without rupture: Secondary | ICD-10-CM | POA: Diagnosis not present

## 2017-12-08 DIAGNOSIS — M8008XD Age-related osteoporosis with current pathological fracture, vertebra(e), subsequent encounter for fracture with routine healing: Secondary | ICD-10-CM | POA: Diagnosis not present

## 2017-12-08 DIAGNOSIS — G629 Polyneuropathy, unspecified: Secondary | ICD-10-CM | POA: Diagnosis not present

## 2017-12-08 DIAGNOSIS — I482 Chronic atrial fibrillation: Secondary | ICD-10-CM | POA: Diagnosis not present

## 2017-12-08 DIAGNOSIS — I10 Essential (primary) hypertension: Secondary | ICD-10-CM | POA: Diagnosis not present

## 2017-12-08 DIAGNOSIS — M069 Rheumatoid arthritis, unspecified: Secondary | ICD-10-CM | POA: Diagnosis not present

## 2017-12-25 ENCOUNTER — Other Ambulatory Visit: Payer: Self-pay | Admitting: Podiatry

## 2017-12-25 ENCOUNTER — Other Ambulatory Visit: Payer: Self-pay | Admitting: Family Medicine

## 2017-12-25 DIAGNOSIS — I6381 Other cerebral infarction due to occlusion or stenosis of small artery: Secondary | ICD-10-CM

## 2018-01-09 NOTE — Progress Notes (Deleted)
HPI: FU abnormal functional study and atrial fibrillation. I intitially saw in January of 2013 for an abnormal electrocardiogram. She had documented dynamic anterior T-wave changes. Myoview was performed in January 2013 and showed mild reversible defect at the apex. This was felt to possibly represent ischemia. Ejection fraction was 77%. I reviewed and felt low risk. Treated medically. Carotid dopplers 9/18 showed <50 bilateral stenosis. Echo 3/19 showed normal LV function, moderate LAE. CTA 5/19 showed 4 cm [name]; followed by vascular surgery. Also with afib treated with rate control; not on anticoagulation duet to h/o falls. Since I last saw her,   Current Outpatient Medications  Medication Sig Dispense Refill  . aspirin 81 MG chewable tablet Chew 81 mg by mouth daily.    Marland Kitchen atenolol (TENORMIN) 50 MG tablet Take 1 tablet (50 mg total) by mouth daily. 90 tablet 3  . DULoxetine (CYMBALTA) 30 MG capsule Take 30 mg by mouth 3 (three) times daily.   2  . gabapentin (NEURONTIN) 800 MG tablet TAKE 1 TABLET BY MOUTH EVERY MORNING AND AT LUNCH, 2 TABLETS BY MOUTH EVERY NIGHT AT BEDTIME 360 tablet 4  . oxyCODONE-acetaminophen (PERCOCET/ROXICET) 5-325 MG tablet Take 1 tablet by mouth every 4 (four) hours as needed for severe pain.    Marland Kitchen oxymorphone (OPANA) 5 MG tablet Take 1 tablet (5 mg total) by mouth 3 (three) times daily. 5 tablet 0  . rosuvastatin (CRESTOR) 40 MG tablet TAKE 1 TABLET BY MOUTH EVERY DAY 90 tablet 3   No current facility-administered medications for this visit.      Past Medical History:  Diagnosis Date  . [name] (abdominal aortic aneurysm) ([place]) 09/2013   3.6 cm  . Atrial fibrillation ([place])   . Dementia   . Diabetes mellitus    pre-diabetes  . Diverticulosis   . Hyperlipidemia   . Hypertension   . Nephrolithiasis   . OSA on CPAP   . Osteoarthritis   . Osteopenia    T=  -2.1 in hip  . Rheumatoid arthritis(714.0)   . S/P carpal tunnel release   . Spinal stenosis      Past Surgical History:  Procedure Laterality Date  . APPENDECTOMY    . CARPAL TUNNEL RELEASE  12/27/2006   Right subcutaneous ulnar nerve transfer -- Right open carpal tunnel release  . CHOLECYSTECTOMY    . FOOT SURGERY    . JOINT REPLACEMENT     BTKR, RTHR  . TOTAL KNEE ARTHROPLASTY  08/21/2007   Right total knee replacement    Social History   Socioeconomic History  . Marital status: Divorced    Spouse name: Not on file  . Number of children: 6  . Years of education: Not on file  . Highest education level: Not on file  Occupational History  . Not on file  Social Needs  . Financial resource strain: Not on file  . Food insecurity:    Worry: Not on file    Inability: Not on file  . Transportation needs:    Medical: Not on file    Non-medical: Not on file  Tobacco Use  . Smoking status: Never Smoker  . Smokeless tobacco: Never Used  Substance and Sexual Activity  . Alcohol use: No  . Drug use: No  . Sexual activity: Not on file  Lifestyle  . Physical activity:    Days per week: Not on file    Minutes per session: Not on file  . Stress: Not on file  Relationships  . Social connections:    Talks on phone: Not on file    Gets together: Not on file    Attends religious service: Not on file    Active member of club or organization: Not on file    Attends meetings of clubs or organizations: Not on file    Relationship status: Not on file  . Intimate partner violence:    Fear of current or ex partner: Not on file    Emotionally abused: Not on file    Physically abused: Not on file    Forced sexual activity: Not on file  Other Topics Concern  . Not on file  Social History Narrative  . Not on file    Family History  Problem Relation Age of Onset  . Stroke Father   . Coronary artery disease Son        CABG at age 72  . Heart disease Son   . Heart attack Son   . Stroke Mother   . Hypertension Sister   . Diabetes Brother   . Hypertension Maternal Aunt      ROS: no fevers or chills, productive cough, hemoptysis, dysphasia, odynophagia, melena, hematochezia, dysuria, hematuria, rash, seizure activity, orthopnea, PND, pedal edema, claudication. Remaining systems are negative.  Physical Exam: Well-developed well-nourished in no acute distress.  Skin is warm and dry.  HEENT is normal.  Neck is supple.  Chest is clear to auscultation with normal expansion.  Cardiovascular exam is regular rate and rhythm.  Abdominal exam nontender or distended. No masses palpated. Extremities show no edema. neuro grossly intact  ECG- personally reviewed  A/P  1 abnormal nuclear study-previous study felt likely to represent shifting breast attenuation but mild ischemia could not be excluded.  She continues to be chest pain-free.  We have therefore elected medical therapy.  Continue aspirin and statin.  We can consider catheterization in the future if she develops symptoms.  2 hyperlipidemia-continue statin.  3 hypertension-patient's blood pressure is controlled.  Continue present medications.  4 abdominal aortic aneurysm-followed by vascular surgery.  5 atrial fibrillation-plan to continue rate control.  She is not on anticoagulation given history of recurrent falls.  Kirk Ruths, MD

## 2018-01-11 ENCOUNTER — Ambulatory Visit: Payer: Medicare Other | Admitting: Cardiology

## 2018-01-30 ENCOUNTER — Inpatient Hospital Stay (HOSPITAL_COMMUNITY): Payer: Medicare Other | Admitting: Certified Registered Nurse Anesthetist

## 2018-01-30 ENCOUNTER — Emergency Department (HOSPITAL_COMMUNITY): Payer: Medicare Other

## 2018-01-30 ENCOUNTER — Encounter: Payer: Self-pay | Admitting: Podiatry

## 2018-01-30 ENCOUNTER — Inpatient Hospital Stay (HOSPITAL_COMMUNITY)
Admission: EM | Admit: 2018-01-30 | Discharge: 2018-02-03 | DRG: 854 | Disposition: A | Discharge status: Home Health | Payer: Medicare Other | Attending: Internal Medicine | Admitting: Internal Medicine

## 2018-01-30 ENCOUNTER — Encounter (HOSPITAL_COMMUNITY): Payer: Self-pay

## 2018-01-30 ENCOUNTER — Other Ambulatory Visit: Payer: Self-pay

## 2018-01-30 ENCOUNTER — Encounter (HOSPITAL_COMMUNITY)
Admission: EM | Disposition: A | Discharge status: Home Health | Payer: Self-pay | Source: Home / Self Care | Attending: Internal Medicine

## 2018-01-30 ENCOUNTER — Ambulatory Visit (INDEPENDENT_AMBULATORY_CARE_PROVIDER_SITE_OTHER): Payer: Medicare Other | Admitting: Podiatry

## 2018-01-30 DIAGNOSIS — Z1612 Extended spectrum beta lactamase (ESBL) resistance: Secondary | ICD-10-CM | POA: Diagnosis present

## 2018-01-30 DIAGNOSIS — Z79899 Other long term (current) drug therapy: Secondary | ICD-10-CM

## 2018-01-30 DIAGNOSIS — R1031 Right lower quadrant pain: Secondary | ICD-10-CM | POA: Diagnosis not present

## 2018-01-30 DIAGNOSIS — E1142 Type 2 diabetes mellitus with diabetic polyneuropathy: Secondary | ICD-10-CM

## 2018-01-30 DIAGNOSIS — N3000 Acute cystitis without hematuria: Secondary | ICD-10-CM | POA: Diagnosis not present

## 2018-01-30 DIAGNOSIS — K5900 Constipation, unspecified: Secondary | ICD-10-CM | POA: Diagnosis present

## 2018-01-30 DIAGNOSIS — I4821 Permanent atrial fibrillation: Secondary | ICD-10-CM | POA: Diagnosis not present

## 2018-01-30 DIAGNOSIS — R7881 Bacteremia: Secondary | ICD-10-CM | POA: Diagnosis not present

## 2018-01-30 DIAGNOSIS — A4151 Sepsis due to Escherichia coli [E. coli]: Secondary | ICD-10-CM | POA: Diagnosis not present

## 2018-01-30 DIAGNOSIS — Z452 Encounter for adjustment and management of vascular access device: Secondary | ICD-10-CM | POA: Diagnosis not present

## 2018-01-30 DIAGNOSIS — R0902 Hypoxemia: Secondary | ICD-10-CM | POA: Diagnosis not present

## 2018-01-30 DIAGNOSIS — B351 Tinea unguium: Secondary | ICD-10-CM

## 2018-01-30 DIAGNOSIS — Z9049 Acquired absence of other specified parts of digestive tract: Secondary | ICD-10-CM | POA: Diagnosis not present

## 2018-01-30 DIAGNOSIS — J9601 Acute respiratory failure with hypoxia: Secondary | ICD-10-CM | POA: Diagnosis not present

## 2018-01-30 DIAGNOSIS — A4150 Gram-negative sepsis, unspecified: Secondary | ICD-10-CM | POA: Diagnosis present

## 2018-01-30 DIAGNOSIS — Z882 Allergy status to sulfonamides status: Secondary | ICD-10-CM | POA: Diagnosis not present

## 2018-01-30 DIAGNOSIS — N209 Urinary calculus, unspecified: Secondary | ICD-10-CM | POA: Diagnosis present

## 2018-01-30 DIAGNOSIS — M1712 Unilateral primary osteoarthritis, left knee: Secondary | ICD-10-CM | POA: Diagnosis not present

## 2018-01-30 DIAGNOSIS — M8580 Other specified disorders of bone density and structure, unspecified site: Secondary | ICD-10-CM | POA: Diagnosis present

## 2018-01-30 DIAGNOSIS — J9621 Acute and chronic respiratory failure with hypoxia: Secondary | ICD-10-CM | POA: Diagnosis not present

## 2018-01-30 DIAGNOSIS — Z960 Presence of urogenital implants: Secondary | ICD-10-CM | POA: Diagnosis not present

## 2018-01-30 DIAGNOSIS — R6520 Severe sepsis without septic shock: Secondary | ICD-10-CM | POA: Diagnosis not present

## 2018-01-30 DIAGNOSIS — Z833 Family history of diabetes mellitus: Secondary | ICD-10-CM | POA: Diagnosis not present

## 2018-01-30 DIAGNOSIS — R296 Repeated falls: Secondary | ICD-10-CM | POA: Diagnosis present

## 2018-01-30 DIAGNOSIS — I482 Chronic atrial fibrillation, unspecified: Secondary | ICD-10-CM | POA: Diagnosis present

## 2018-01-30 DIAGNOSIS — N132 Hydronephrosis with renal and ureteral calculous obstruction: Secondary | ICD-10-CM | POA: Diagnosis present

## 2018-01-30 DIAGNOSIS — Z87442 Personal history of urinary calculi: Secondary | ICD-10-CM | POA: Diagnosis not present

## 2018-01-30 DIAGNOSIS — I4891 Unspecified atrial fibrillation: Secondary | ICD-10-CM | POA: Diagnosis present

## 2018-01-30 DIAGNOSIS — N201 Calculus of ureter: Secondary | ICD-10-CM | POA: Diagnosis not present

## 2018-01-30 DIAGNOSIS — I714 Abdominal aortic aneurysm, without rupture: Secondary | ICD-10-CM | POA: Diagnosis present

## 2018-01-30 DIAGNOSIS — M1990 Unspecified osteoarthritis, unspecified site: Secondary | ICD-10-CM | POA: Diagnosis present

## 2018-01-30 DIAGNOSIS — R509 Fever, unspecified: Secondary | ICD-10-CM | POA: Diagnosis not present

## 2018-01-30 DIAGNOSIS — G4733 Obstructive sleep apnea (adult) (pediatric): Secondary | ICD-10-CM | POA: Diagnosis present

## 2018-01-30 DIAGNOSIS — F0390 Unspecified dementia without behavioral disturbance: Secondary | ICD-10-CM | POA: Diagnosis present

## 2018-01-30 DIAGNOSIS — Z96651 Presence of right artificial knee joint: Secondary | ICD-10-CM | POA: Diagnosis present

## 2018-01-30 DIAGNOSIS — N1330 Unspecified hydronephrosis: Secondary | ICD-10-CM | POA: Diagnosis not present

## 2018-01-30 DIAGNOSIS — R000 Tachycardia, unspecified: Secondary | ICD-10-CM | POA: Diagnosis not present

## 2018-01-30 DIAGNOSIS — A419 Sepsis, unspecified organism: Secondary | ICD-10-CM | POA: Diagnosis not present

## 2018-01-30 DIAGNOSIS — J90 Pleural effusion, not elsewhere classified: Secondary | ICD-10-CM | POA: Diagnosis not present

## 2018-01-30 DIAGNOSIS — G894 Chronic pain syndrome: Secondary | ICD-10-CM | POA: Diagnosis not present

## 2018-01-30 DIAGNOSIS — Z7982 Long term (current) use of aspirin: Secondary | ICD-10-CM | POA: Diagnosis not present

## 2018-01-30 DIAGNOSIS — R52 Pain, unspecified: Secondary | ICD-10-CM | POA: Diagnosis not present

## 2018-01-30 DIAGNOSIS — G8929 Other chronic pain: Secondary | ICD-10-CM | POA: Diagnosis present

## 2018-01-30 DIAGNOSIS — Z96653 Presence of artificial knee joint, bilateral: Secondary | ICD-10-CM | POA: Diagnosis not present

## 2018-01-30 DIAGNOSIS — Z792 Long term (current) use of antibiotics: Secondary | ICD-10-CM | POA: Diagnosis not present

## 2018-01-30 DIAGNOSIS — N10 Acute tubulo-interstitial nephritis: Secondary | ICD-10-CM | POA: Diagnosis present

## 2018-01-30 DIAGNOSIS — M79676 Pain in unspecified toe(s): Secondary | ICD-10-CM

## 2018-01-30 DIAGNOSIS — R410 Disorientation, unspecified: Secondary | ICD-10-CM | POA: Diagnosis not present

## 2018-01-30 DIAGNOSIS — M25562 Pain in left knee: Secondary | ICD-10-CM | POA: Diagnosis not present

## 2018-01-30 DIAGNOSIS — B9629 Other Escherichia coli [E. coli] as the cause of diseases classified elsewhere: Secondary | ICD-10-CM | POA: Diagnosis present

## 2018-01-30 DIAGNOSIS — E785 Hyperlipidemia, unspecified: Secondary | ICD-10-CM | POA: Diagnosis present

## 2018-01-30 DIAGNOSIS — B888 Other specified infestations: Secondary | ICD-10-CM | POA: Diagnosis not present

## 2018-01-30 DIAGNOSIS — B9620 Unspecified Escherichia coli [E. coli] as the cause of diseases classified elsewhere: Secondary | ICD-10-CM | POA: Diagnosis present

## 2018-01-30 DIAGNOSIS — I10 Essential (primary) hypertension: Secondary | ICD-10-CM | POA: Diagnosis not present

## 2018-01-30 DIAGNOSIS — A499 Bacterial infection, unspecified: Secondary | ICD-10-CM | POA: Diagnosis present

## 2018-01-30 DIAGNOSIS — Q828 Other specified congenital malformations of skin: Secondary | ICD-10-CM

## 2018-01-30 DIAGNOSIS — M069 Rheumatoid arthritis, unspecified: Secondary | ICD-10-CM | POA: Diagnosis present

## 2018-01-30 DIAGNOSIS — N136 Pyonephrosis: Secondary | ICD-10-CM | POA: Diagnosis present

## 2018-01-30 DIAGNOSIS — Z79891 Long term (current) use of opiate analgesic: Secondary | ICD-10-CM | POA: Diagnosis not present

## 2018-01-30 DIAGNOSIS — Z96641 Presence of right artificial hip joint: Secondary | ICD-10-CM | POA: Diagnosis not present

## 2018-01-30 DIAGNOSIS — H9313 Tinnitus, bilateral: Secondary | ICD-10-CM | POA: Diagnosis present

## 2018-01-30 DIAGNOSIS — Z8249 Family history of ischemic heart disease and other diseases of the circulatory system: Secondary | ICD-10-CM

## 2018-01-30 DIAGNOSIS — E119 Type 2 diabetes mellitus without complications: Secondary | ICD-10-CM | POA: Diagnosis present

## 2018-01-30 DIAGNOSIS — Z87440 Personal history of urinary (tract) infections: Secondary | ICD-10-CM

## 2018-01-30 DIAGNOSIS — R7303 Prediabetes: Secondary | ICD-10-CM | POA: Diagnosis not present

## 2018-01-30 DIAGNOSIS — R0602 Shortness of breath: Secondary | ICD-10-CM | POA: Diagnosis not present

## 2018-01-30 DIAGNOSIS — R531 Weakness: Secondary | ICD-10-CM | POA: Diagnosis not present

## 2018-01-30 DIAGNOSIS — N390 Urinary tract infection, site not specified: Secondary | ICD-10-CM | POA: Diagnosis not present

## 2018-01-30 DIAGNOSIS — G4489 Other headache syndrome: Secondary | ICD-10-CM | POA: Diagnosis not present

## 2018-01-30 DIAGNOSIS — Z823 Family history of stroke: Secondary | ICD-10-CM

## 2018-01-30 DIAGNOSIS — R109 Unspecified abdominal pain: Secondary | ICD-10-CM | POA: Diagnosis not present

## 2018-01-30 DIAGNOSIS — Z9181 History of falling: Secondary | ICD-10-CM

## 2018-01-30 HISTORY — PX: CYSTOSCOPY W/ URETERAL STENT PLACEMENT: SHX1429

## 2018-01-30 LAB — LIPASE, BLOOD: Lipase: 23 U/L (ref 11–51)

## 2018-01-30 LAB — COMPREHENSIVE METABOLIC PANEL
ALT: 13 U/L (ref 0–44)
AST: 22 U/L (ref 15–41)
Albumin: 3.6 g/dL (ref 3.5–5.0)
Alkaline Phosphatase: 68 U/L (ref 38–126)
Anion gap: 12 (ref 5–15)
BUN: 18 mg/dL (ref 8–23)
CO2: 29 mmol/L (ref 22–32)
Calcium: 9.2 mg/dL (ref 8.9–10.3)
Chloride: 99 mmol/L (ref 98–111)
Creatinine, Ser: 1.03 mg/dL — ABNORMAL HIGH (ref 0.44–1.00)
GFR calc Af Amer: 57 mL/min — ABNORMAL LOW (ref >60)
GFR calc non Af Amer: 49 mL/min — ABNORMAL LOW (ref >60)
Glucose, Bld: 114 mg/dL — ABNORMAL HIGH (ref 70–99)
Potassium: 4.6 mmol/L (ref 3.5–5.1)
Sodium: 140 mmol/L (ref 135–145)
Total Bilirubin: 1 mg/dL (ref 0.3–1.2)
Total Protein: 7.5 g/dL (ref 6.5–8.1)

## 2018-01-30 LAB — CBC WITH DIFFERENTIAL/PLATELET
Basophils Absolute: 0 10*3/uL (ref 0.0–0.1)
Basophils Relative: 0 %
Eosinophils Absolute: 0 10*3/uL (ref 0.0–0.7)
Eosinophils Relative: 0 %
HCT: 42.8 % (ref 36.0–46.0)
Hemoglobin: 13.7 g/dL (ref 12.0–15.0)
Lymphocytes Relative: 9 %
Lymphs Abs: 1.3 10*3/uL (ref 0.7–4.0)
MCH: 29.8 pg (ref 26.0–34.0)
MCHC: 32 g/dL (ref 30.0–36.0)
MCV: 93.2 fL (ref 78.0–100.0)
Monocytes Absolute: 1.1 10*3/uL — ABNORMAL HIGH (ref 0.1–1.0)
Monocytes Relative: 8 %
Neutro Abs: 11 10*3/uL — ABNORMAL HIGH (ref 1.7–7.7)
Neutrophils Relative %: 83 %
Platelets: 185 10*3/uL (ref 150–400)
RBC: 4.59 MIL/uL (ref 3.87–5.11)
RDW: 13.3 % (ref 11.5–15.5)
WBC: 13.4 10*3/uL — ABNORMAL HIGH (ref 4.0–10.5)

## 2018-01-30 LAB — I-STAT CG4 LACTIC ACID, ED: Lactic Acid, Venous: 1.86 mmol/L (ref 0.5–1.9)

## 2018-01-30 SURGERY — CYSTOSCOPY, FLEXIBLE, WITH STENT REPLACEMENT
Anesthesia: General | Site: Ureter | Laterality: Right

## 2018-01-30 MED ORDER — LIDOCAINE 2% (20 MG/ML) 5 ML SYRINGE
INTRAMUSCULAR | Status: AC
  Filled 2018-01-30: qty 5

## 2018-01-30 MED ORDER — ONDANSETRON HCL 4 MG PO TABS: 4.0000 mg | ORAL_TABLET | Freq: Four times a day (QID) | ORAL | Status: DC | PRN

## 2018-01-30 MED ORDER — PIPERACILLIN-TAZOBACTAM 3.375 G IVPB 30 MIN
3.3750 g | Freq: Once | INTRAVENOUS | Status: AC
  Administered 2018-01-30: 3.375 g via INTRAVENOUS
  Filled 2018-01-30: qty 50

## 2018-01-30 MED ORDER — PROPOFOL 10 MG/ML IV BOLUS
INTRAVENOUS | Status: AC
  Filled 2018-01-30: qty 20

## 2018-01-30 MED ORDER — DEXAMETHASONE SODIUM PHOSPHATE 10 MG/ML IJ SOLN
INTRAMUSCULAR | Status: AC
  Filled 2018-01-30: qty 1

## 2018-01-30 MED ORDER — ACETAMINOPHEN 650 MG RE SUPP: 650.0000 mg | Freq: Four times a day (QID) | RECTAL | Status: DC | PRN

## 2018-01-30 MED ORDER — METOPROLOL TARTRATE 5 MG/5ML IV SOLN
2.5000 mg | Freq: Three times a day (TID) | INTRAVENOUS | Status: DC
  Administered 2018-01-31 (×3): 2.5 mg via INTRAVENOUS
  Filled 2018-01-30 (×3): qty 5

## 2018-01-30 MED ORDER — PIPERACILLIN-TAZOBACTAM 3.375 G IVPB
3.3750 g | Freq: Three times a day (TID) | INTRAVENOUS | Status: DC
  Administered 2018-01-31 (×2): 3.375 g via INTRAVENOUS
  Filled 2018-01-30 (×2): qty 50

## 2018-01-30 MED ORDER — LACTATED RINGERS IV SOLN
INTRAVENOUS | Status: DC | PRN
  Administered 2018-01-30: via INTRAVENOUS

## 2018-01-30 MED ORDER — ONDANSETRON HCL 4 MG/2ML IJ SOLN
INTRAMUSCULAR | Status: AC
  Filled 2018-01-30: qty 2

## 2018-01-30 MED ORDER — SODIUM CHLORIDE 0.9 % IV BOLUS
1000.0000 mL | Freq: Once | INTRAVENOUS | Status: AC
  Administered 2018-01-30: 1000 mL via INTRAVENOUS

## 2018-01-30 MED ORDER — ONDANSETRON HCL 4 MG/2ML IJ SOLN: 4.0000 mg | Freq: Four times a day (QID) | INTRAMUSCULAR | Status: DC | PRN

## 2018-01-30 MED ORDER — ACETAMINOPHEN 500 MG PO TABS
1000.0000 mg | ORAL_TABLET | Freq: Once | ORAL | Status: AC
  Administered 2018-01-30: 1000 mg via ORAL
  Filled 2018-01-30: qty 2

## 2018-01-30 MED ORDER — ACETAMINOPHEN 325 MG PO TABS: 650.0000 mg | ORAL_TABLET | Freq: Four times a day (QID) | ORAL | Status: DC | PRN

## 2018-01-30 MED ORDER — FENTANYL CITRATE (PF) 100 MCG/2ML IJ SOLN
INTRAMUSCULAR | Status: AC
  Filled 2018-01-30: qty 2

## 2018-01-30 MED ORDER — SODIUM CHLORIDE 0.9 % IV SOLN
INTRAVENOUS | Status: DC
  Administered 2018-01-31 (×3): via INTRAVENOUS

## 2018-01-30 SURGICAL SUPPLY — 10 items
BAG URO CATCHER STRL LF (MISCELLANEOUS) ×2 IMPLANT
CATH URET 5FR 28IN OPEN ENDED (CATHETERS) ×2 IMPLANT
CLOTH BEACON ORANGE TIMEOUT ST (SAFETY) ×2 IMPLANT
GLOVE BIOGEL M STRL SZ7.5 (GLOVE) ×2 IMPLANT
GOWN STRL REUS W/TWL LRG LVL3 (GOWN DISPOSABLE) ×4 IMPLANT
GUIDEWIRE STR DUAL SENSOR (WIRE) ×2 IMPLANT
MANIFOLD NEPTUNE II (INSTRUMENTS) ×2 IMPLANT
PACK CYSTO (CUSTOM PROCEDURE TRAY) ×2 IMPLANT
STENT URET 6FRX24 CONTOUR (STENTS) ×2 IMPLANT
TUBING CONNECTING 10 (TUBING) IMPLANT

## 2018-01-30 NOTE — Progress Notes (Signed)
A consult was received from an ED physician for zosyn per pharmacy dosing.  The patient's profile has been reviewed for ht/wt/allergies/indication/available labs.   A one time order has been placed for zosyn 3.375 gm IV.    Further antibiotics/pharmacy consults should be ordered by admitting physician if indicated.                       Thank you, Eudelia Bunch, Pharm.D [phone] 01/30/2018 9:00 PM

## 2018-01-30 NOTE — ED Provider Notes (Signed)
[facility] DEPT Provider Note   CSN: [id number] Arrival date & time: 01/30/18  2019     History   Chief Complaint Chief Complaint  Patient presents with  . Generalized Body Aches  . Weakness  . Flank Pain    HPI [name] is a 84 y.o. female with a history of atrial fibrillation NOT on anticoagulation, dementia, diabetes mellitus, recurrent UTIs, rheumatoid arthritis, HTN, [name], diabetes mellitus, nephrolithiasis resents to the emergency department by EMS with a chief complaint of flank pain.  The patient reports bilateral flank pain with generalized weakness, headache, and tinnitus x3 days.  She states the pain is sharp and constant.  Worsening since onset.  No known aggravating or alleviating factors.  She also endorses bilateral tinnitus and states " it feels like her bees in my head because there is buzzing".  Denies chest pain, dyspnea, cough, visual changes, focal weakness, numbness, cough, diarrhea, constipation, hematuria, dysuria, or vaginal complaints.  She states that she has been a little more dizzy with standing over the last few days.  No treatment prior to arrival.  Patient unsure of who her urologist is or if she has one.  The patient's daughter is present with the patient, but states that the patient's son manages most of her medical care.  She is not anticoagulated for her A. fib.  She reports that she is concerned that the patient is unable to fully care for herself at home and thinks that she may benefit from some in-home care.  The history is provided by the patient. No language interpreter was used.    Past Medical History:  Diagnosis Date  . [name] (abdominal aortic aneurysm) ([place]) 09/2013   3.6 cm  . Atrial fibrillation ([place])   . Dementia ([place])   . Diabetes mellitus    pre-diabetes  . Diverticulosis   . Hyperlipidemia   . Hypertension   . Nephrolithiasis   . OSA on CPAP   . Osteoarthritis   . Osteopenia    T=  -2.1 in  hip  . Rheumatoid arthritis(714.0)   . S/P carpal tunnel release   . Spinal stenosis     Patient Active Problem List   Diagnosis Date Noted  . Sepsis ([place]) 01/30/2018  . Urolithiasis 01/30/2018  . Hydronephrosis with renal and ureteral calculus obstruction 01/30/2018  . Sepsis due to gram-negative UTI ([place]) 01/30/2018  . Fall   . T12 compression fracture ([place]) 09/04/2017  . UTI (urinary tract infection) 07/03/2017  . Fall at home, initial encounter 07/03/2017  . A-fib ([place]) 07/03/2017  . Tachycardia 02/03/2017  . Sepsis secondary to UTI ([place]) 02/02/2017  . Chronic pain syndrome 02/02/2017  . Abnormal EKG 11/21/2013  . [name] (abdominal aortic aneurysm) ([place]) 09/30/2013  . OSA (obstructive sleep apnea) 06/29/2012  . Dementia ([place]) 06/29/2012  . Pre-diabetes 06/29/2012  . Hypoxia, sleep related 06/29/2012  . RA (rheumatoid arthritis) ([place]) 06/29/2012  . Nonspecific abnormal results of cardiovascular function study 06/02/2011  . Nonspecific abnormal electrocardiogram (ECG) (EKG) 05/18/2011  . OSTEOPOROSIS 04/09/2008  . FLANK PAIN, RIGHT 03/04/2008  . Hyperlipidemia 05/12/2006  . PYELONEPHRITIS, ACUTE 05/12/2006  . OSTEOARTHRITIS 05/12/2006  . DEPENDENT EDEMA, LEGS, BILATERAL 05/12/2006  . URINARY INCONTINENCE 05/12/2006  . PERIPHERAL NEUROPATHY 03/11/2006  . Essential hypertension 03/11/2006  . DYSURIA 03/11/2006    Past Surgical History:  Procedure Laterality Date  . APPENDECTOMY    . CARPAL TUNNEL RELEASE  12/27/2006   Right subcutaneous ulnar nerve transfer -- Right  open carpal tunnel release  . CHOLECYSTECTOMY    . FOOT SURGERY    . JOINT REPLACEMENT     BTKR, RTHR  . TOTAL KNEE ARTHROPLASTY  08/21/2007   Right total knee replacement     OB History   None      Home Medications    Prior to Admission medications   Medication Sig Start Date End Date Taking? Authorizing Provider  aspirin 81 MG chewable tablet Chew 81 mg by mouth daily.   Yes [provider]  atenolol (TENORMIN) 50 MG tablet Take 1 tablet (50 mg total) by mouth daily. 08/24/17  Yes [name], MD  DULoxetine (CYMBALTA) 30 MG capsule Take 90 mg by mouth every morning.  07/29/14  Yes [provider]  gabapentin (NEURONTIN) 800 MG tablet TAKE 1 TABLET BY MOUTH EVERY MORNING AND AT LUNCH, 2 TABLETS BY MOUTH EVERY NIGHT AT BEDTIME 12/25/17  Yes Hyatt, Max T, DPM  oxymorphone (OPANA) 5 MG tablet Take 1 tablet (5 mg total) by mouth 3 (three) times daily. 09/08/17  Yes Charlynne Cousins, MD  rosuvastatin (CRESTOR) 40 MG tablet TAKE 1 TABLET BY MOUTH EVERY DAY 12/25/17  Yes [name], MD    Family History Family History  Problem Relation Age of Onset  . Stroke Father   . Coronary artery disease Son        CABG at age 33  . Heart disease Son   . Heart attack Son   . Stroke Mother   . Hypertension Sister   . Diabetes Brother   . Hypertension Maternal Aunt     Social History Social History   Tobacco Use  . Smoking status: Never Smoker  . Smokeless tobacco: Never Used  Substance Use Topics  . Alcohol use: No  . Drug use: No     Allergies   Sulfonamide derivatives   Review of Systems Review of Systems  Constitutional: Positive for fever. Negative for activity change.  HENT: Positive for tinnitus. Negative for congestion and ear pain.   Respiratory: Negative for cough and shortness of breath.   Cardiovascular: Negative for chest pain.  Gastrointestinal: Positive for abdominal pain. Negative for blood in stool, constipation, diarrhea, nausea and vomiting.  Genitourinary: Positive for flank pain. Negative for dysuria, hematuria, urgency, vaginal bleeding and vaginal discharge.  Musculoskeletal: Negative for back pain.  Skin: Negative for rash.  Allergic/Immunologic: Negative for immunocompromised state.  Neurological: Positive for dizziness, weakness (generalized) and headaches. Negative for syncope and numbness.    Psychiatric/Behavioral: Negative for confusion.   Physical Exam Updated Vital Signs BP 103/75 (BP Location: Right Arm)   Pulse 91   Temp 98.4 F (36.9 C)   Resp 15   Ht 5\' 4"  (1.626 m)   Wt 77.1 kg   SpO2 98%   BMI 29.18 kg/m   Physical Exam  Constitutional: No distress.  Ill-appearing.   HENT:  Head: Normocephalic.  Right Ear: Hearing, external ear and ear canal normal. Tympanic membrane is erythematous and bulging. No hemotympanum.  Left Ear: Hearing, tympanic membrane, external ear and ear canal normal. No hemotympanum.  Lips and mucous membranes are dry.   Eyes: Conjunctivae are normal. No scleral icterus.  Neck: Normal range of motion. Neck supple.  Cardiovascular: Normal rate, regular rhythm, normal heart sounds and intact distal pulses. Exam reveals no gallop and no friction rub.  No murmur heard. Pulmonary/Chest: Effort normal. No stridor. No respiratory distress. She has no wheezes. She has no rales.  She exhibits no tenderness.  Abdominal: Soft. She exhibits no distension and no mass. There is tenderness. There is no rebound and no guarding. No hernia.  TTP to the bilateral CVA, R>L.  Abdomen is obese, soft, and nondistended.   Neurological: She is alert.  Skin: Skin is warm. Capillary refill takes 2 to 3 seconds. No rash noted.  Psychiatric: Her behavior is normal.  Nursing note and vitals reviewed.  ED Treatments / Results  Labs (all labs ordered are listed, but only abnormal results are displayed) Labs Reviewed  COMPREHENSIVE METABOLIC PANEL - Abnormal; Notable for the following components:      Result Value   Glucose, Bld 114 (*)    Creatinine, Ser 1.03 (*)    GFR calc non Af Amer 49 (*)    GFR calc Af Amer 57 (*)    All other components within normal limits  CBC WITH DIFFERENTIAL/PLATELET - Abnormal; Notable for the following components:   WBC 13.4 (*)    Neutro Abs 11.0 (*)    Monocytes Absolute 1.1 (*)    All other components within normal limits   GLUCOSE, CAPILLARY - Abnormal; Notable for the following components:   Glucose-Capillary 122 (*)    All other components within normal limits  CULTURE, BLOOD (ROUTINE X 2)  CULTURE, BLOOD (ROUTINE X 2)  URINE CULTURE  LIPASE, BLOOD  URINALYSIS, ROUTINE W REFLEX MICROSCOPIC  CBC WITH DIFFERENTIAL/PLATELET  COMPREHENSIVE METABOLIC PANEL  LACTIC ACID, PLASMA  PROCALCITONIN  PROTIME-INR  APTT  I-STAT CG4 LACTIC ACID, ED  TYPE AND SCREEN    EKG EKG Interpretation  Date/Time:  Tuesday January 30 2018 20:36:24 EDT Ventricular Rate:  112 PR Interval:    QRS Duration: 77 QT Interval:  293 QTC Calculation: 400 R Axis:   48 Text Interpretation:  Atrial fibrillation Low voltage, extremity and precordial leads Baseline wander in lead(s) V6 faster rate, no other changes Confirmed by Merrily Pew [phone]) on 01/30/2018 9:04:08 PM   Radiology Dg C-arm 1-60 Min-no Report  Result Date: 01/31/2018 Fluoroscopy was utilized by the requesting physician.  No radiographic interpretation.   Ct Renal Stone Study  Result Date: 01/30/2018 CLINICAL DATA:  Acute abdominal flank pain worse on the right EXAM: CT ABDOMEN AND PELVIS WITHOUT CONTRAST TECHNIQUE: Multidetector CT imaging of the abdomen and pelvis was performed following the standard protocol without IV contrast. COMPARISON:  09/04/2017 FINDINGS: Lower chest: Bibasilar atelectasis. Mild cardiomegaly. No pericardial or pleural effusion. Small hiatal hernia. Descending thoracic aorta is tortuous and ectatic with atherosclerosis. Hepatobiliary: No focal liver abnormality is seen. Status post cholecystectomy. No biliary dilatation. Pancreas: Unremarkable. No pancreatic ductal dilatation or surrounding inflammatory changes. Spleen: Normal in size without focal abnormality. Adrenals/Urinary Tract: Normal adrenal glands. Right kidney demonstrates acute perinephric strandy edema/inflammation. There is associated moderate right hydroureteronephrosis  secondary to a proximal obstructing right ureteral calculus measuring 6 mm, image 34 series 2. There is a stable 2.5 cm right kidney lower pole hypodense cyst medially. Left kidney and ureter demonstrate no acute process or obstruction. Left ureter is nondilated. Air noted within the bladder dome.  Bladder otherwise unremarkable. Stomach/Bowel: Negative for bowel obstruction, significant dilatation, ileus, or free air. Appendix not visualized. No fluid collection or abscess. Moderate colonic stool burden. Extensive sigmoid diverticulosis without acute inflammatory process. Vascular/Lymphatic: Atherosclerosis of the aorta. Infrarenal aorta has aneurysm dilatation measuring 3.9 cm. Extensive iliac atherosclerosis as well. No retroperitoneal hemorrhage or hematoma. No adenopathy. Reproductive: Remote hysterectomy. No other pelvic abnormality, fluid collection, ascites, or  abscess. Other: No abdominal wall hernia or abnormality. No abdominopelvic ascites. Musculoskeletal: Bones are osteopenic. Degenerative changes of the spine. Extensive facet arthropathy. T12 and L5 subacute partially healing compression fractures noted when compared to 07/03/2017. IMPRESSION: Right proximal ureteral 6 mm obstructing calculus with associated right hydroureteronephrosis and perinephric/ureteral inflammation and edema. 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 Remote cholecystectomy, appendectomy and hysterectomy Colonic diverticulosis without acute inflammation or complicating feature Subacute T12 and L5 compression fractures, new since 07/03/2017 Electronically Signed   By: Jerilynn Mages.  Shick M.D.   On: 01/30/2018 22:00    Procedures .Critical Care Performed by: Joanne Gavel, PA-C Authorized by: Joanne Gavel, PA-C   Critical care provider statement:    Critical care time (minutes):  40   Critical care time was exclusive of:  Separately billable procedures and treating  other patients and teaching time   Critical care was necessary to treat or prevent imminent or life-threatening deterioration of the following conditions:  Sepsis   Critical care was time spent personally by me on the following activities:  Ordering and review of laboratory studies, ordering and review of radiographic studies, ordering and performing treatments and interventions, pulse oximetry, re-evaluation of patient's condition, review of old charts, discussions with consultants, development of treatment plan with patient or surrogate, examination of patient, evaluation of patient's response to treatment and obtaining history from patient or surrogate   (including critical care time)  Medications Ordered in ED Medications  acetaminophen (TYLENOL) tablet 650 mg ( Oral MAR Unhold 01/31/18 0104)    Or  acetaminophen (TYLENOL) suppository 650 mg ( Rectal MAR Unhold 01/31/18 0104)  ondansetron (ZOFRAN) tablet 4 mg ( Oral MAR Unhold 01/31/18 0104)    Or  ondansetron (ZOFRAN) injection 4 mg ( Intravenous MAR Unhold 01/31/18 0104)  0.9 %  sodium chloride infusion (has no administration in time range)  metoprolol tartrate (LOPRESSOR) injection 2.5 mg ( Intravenous MAR Unhold 01/31/18 0104)  piperacillin-tazobactam (ZOSYN) IVPB 3.375 g ( Intravenous MAR Unhold 01/31/18 0104)  acetaminophen (TYLENOL) tablet 1,000 mg (1,000 mg Oral Given 01/30/18 2123)  piperacillin-tazobactam (ZOSYN) IVPB 3.375 g (0 g Intravenous Stopped 01/30/18 2235)  sodium chloride 0.9 % bolus 1,000 mL (0 mLs Intravenous Stopped 01/30/18 2252)   Initial Impression / Assessment and Plan / ED Course  I have reviewed the triage vital signs and the nursing notes.  Pertinent labs & imaging results that were available during my care of the patient were reviewed by me and considered in my medical decision making (see chart for details).     84 year old female with a history of atrial fibrillation, dementia, diabetes mellitus, recurrent  UTIs, rheumatoid arthritis, HTN, [name], diabetes mellitus, nephrolithiasis presenting by EMS from home with generalized weakness and bilateral flank pain x3 days.  She also endorses a headache and a "buzzing" tinnitus.  The patient was seen and discussed with Dr. Dayna Barker, attending physician, who independently assessed the patient.  Tachycardic in the 120s on arrival.  Rectal temp 102.2 on arrival.  Normotensive.  Code sepsis initiated.  Given her symptoms and CVA tenderness on exam, suspect urinary source.  Previous urine cultures were evaluated and all were susceptible to piperacillin tazobactam.  Dosed per pharmacy and initiated in the ED.  Initial lactate is normal.  She has a leukocytosis of 13.4 with an elevated absolute neutrophil count.  Blood cultures x2 have been drawn.  EKG with A. Fib.  She was given a 1 L fluid bolus  and rate significantly improved to ~100.  She is not anticoagulated as she is a fall risk.  Labs are otherwise notable for creatinine of 1.03.   CT stone study with right proximal 6 mm obstructing ureteral calculus with associated right hydroureteronephrosis and perinephric ureteral inflammation and edema.  [name] was again noted measuring up to 3.9 cm proximally.  She has subacute T12 and L5 compression fractures new since March 2019.  Consulted urology and spoke with Dr. Natividad Brood, resident, will plan for ureteral stent placement in the OR.    Otherwise, on exam, the patient does have bulging and erythema to the right TM, which I suspect is the source of her tinnitus.  Will defer treatment at this time as the patient is critically ill due to obstructive calculus.  Consulted the hospitalist team for medical admission and Dr. Hal Hope will accept the patient for admission. The patient appears reasonably stabilized for admission considering the current resources, flow, and capabilities available in the ED at this time, and I doubt any other [facility] requiring further screening and/or treatment  in the ED prior to admission.  Final Clinical Impressions(s) / ED Diagnoses   Final diagnoses:  Hydronephrosis with urinary obstruction due to ureteral calculus  Severe sepsis [facility])    ED Discharge Orders    None       Joanne Gavel, PA-C 01/31/18 0115    Mesner, Corene Cornea, MD 02/02/18 1622

## 2018-01-30 NOTE — Progress Notes (Signed)
Pharmacy Antibiotic Note  [name] is a 84 y.o. female admitted on 01/30/2018 with intra-abdominal infection.  Pharmacy has been consulted for zosyn dosing.  Plan: Zosyn 3.375g IV q8h (4 hour infusion).  F/u scr/cultures  Height: 5\' 4"  (162.6 cm) Weight: 169 lb 15.6 oz (77.1 kg) IBW/kg (Calculated) : 54.7  Temp (24hrs), Avg:101.3 F (38.5 C), Min:100.3 F (37.9 C), Max:102.2 F (39 C)  Recent Labs  Lab 01/30/18 2052 01/30/18 2118  WBC 13.4*  --   CREATININE 1.03*  --   LATICACIDVEN  --  1.86    Estimated Creatinine Clearance: 42.3 mL/min (A) (by C-G formula based on SCr of 1.03 mg/dL (H)).    Allergies  Allergen Reactions  . Sulfonamide Derivatives Hives and Rash    Antimicrobials this admission: 10/1 zosyn >>    >>   Dose adjustments this admission:   Microbiology results:  BCx:   UCx:    Sputum:    MRSA PCR:   Thank you for allowing pharmacy to be a part of this patient's care.  Dorrene German 01/30/2018 11:23 PM

## 2018-01-30 NOTE — Consult Note (Signed)
Urology Consult Note   Requesting Attending Physician:  Rise Patience, MD Service Providing Consult: Urology  Consulting Attending: Feliciana Rossetti   Reason for Consult:  nephrolithiasis  HPI: [name] is seen in consultation for reasons noted above at the request of Rise Patience, MD for evaluation of right proximal ureteral stone.  This is a 83 y.o. female with a history of dementia, DM, a-fib, recurrent UTIs, RA, OSA on CPAP, [name], HTN, [name], and nephrolithiasis who presents to the ED on 01/30/18 with acute right flank pain. She has also had weakness, HA, and tinnitus x 3 days. Initial workup demonstrated SIRS with tachycardia to 110, fever to 102.2, and tachypnea to 23. Lab work showed a WBC of ~13.4, but normal lactate level.   She had a CT renal protocol performed which revealed a 6 mm obstructing right proximal ureteral stone with associated moderate hydroureteronephrosis and perinephric stranding.   She is accompanied by her daughter on my interview. Daughter provides much of the history. Patient is able to answer some simple questions. Patient has been having several weeks of right flank pain. This was not worked up before. In the last 24 hours she developed rigors and chills which seemed to worsen. This is was prompted ED visit. Her discomfort is mild to moderate. She has not had anything to eat since early this morning.   She has a history of prior stones and reportedly had prior ESWL. She has a midline incision from prior cholecystectomy.  She has not given a urine sample yet.   Past Medical History: Past Medical History:  Diagnosis Date  . [name] (abdominal aortic aneurysm) ([place]) 09/2013   3.6 cm  . Atrial fibrillation ([place])   . Dementia ([place])   . Diabetes mellitus    pre-diabetes  . Diverticulosis   . Hyperlipidemia   . Hypertension   . Nephrolithiasis   . OSA on CPAP   . Osteoarthritis   . Osteopenia    T=  -2.1 in hip  . Rheumatoid  arthritis(714.0)   . S/P carpal tunnel release   . Spinal stenosis     Past Surgical History:  Past Surgical History:  Procedure Laterality Date  . APPENDECTOMY    . CARPAL TUNNEL RELEASE  12/27/2006   Right subcutaneous ulnar nerve transfer -- Right open carpal tunnel release  . CHOLECYSTECTOMY    . FOOT SURGERY    . JOINT REPLACEMENT     BTKR, RTHR  . TOTAL KNEE ARTHROPLASTY  08/21/2007   Right total knee replacement    Medication: Current Facility-Administered Medications  Medication Dose Route Frequency Provider Last Rate Last Dose  . sodium chloride 0.9 % bolus 1,000 mL  1,000 mL Intravenous Once McDonald, Mia A, PA-C 983.6 mL/hr at 01/30/18 2148 1,000 mL at 01/30/18 2148   Current Outpatient Medications  Medication Sig Dispense Refill  . aspirin 81 MG chewable tablet Chew 81 mg by mouth daily.    Marland Kitchen atenolol (TENORMIN) 50 MG tablet Take 1 tablet (50 mg total) by mouth daily. 90 tablet 3  . DULoxetine (CYMBALTA) 30 MG capsule Take 90 mg by mouth every morning.   2  . gabapentin (NEURONTIN) 800 MG tablet TAKE 1 TABLET BY MOUTH EVERY MORNING AND AT LUNCH, 2 TABLETS BY MOUTH EVERY NIGHT AT BEDTIME 360 tablet 4  . oxymorphone (OPANA) 5 MG tablet Take 1 tablet (5 mg total) by mouth 3 (three) times daily. 5 tablet 0  . rosuvastatin (CRESTOR) 40 MG tablet TAKE  1 TABLET BY MOUTH EVERY DAY 90 tablet 3    Allergies: Allergies  Allergen Reactions  . Sulfonamide Derivatives Hives and Rash    Social History: Social History   Tobacco Use  . Smoking status: Never Smoker  . Smokeless tobacco: Never Used  Substance Use Topics  . Alcohol use: No  . Drug use: No    Family History Family History  Problem Relation Age of Onset  . Stroke Father   . Coronary artery disease Son        CABG at age 87  . Heart disease Son   . Heart attack Son   . Stroke Mother   . Hypertension Sister   . Diabetes Brother   . Hypertension Maternal Aunt     Review of Systems 10 systems were  reviewed and are negative except as noted specifically in the HPI.  Objective   Vital signs in last 24 hours: BP 137/82   Pulse (!) 110   Temp (!) 102.2 F (39 C) (Rectal)   Resp (!) 23   Ht 5\' 4"  (1.626 m)   Wt 77.1 kg   SpO2 97%   BMI 29.18 kg/m   Physical Exam General: NAD, A&O, resting, appropriate HEENT: [place]/AT, EOMI, MMM Pulmonary: Normal work of breathing Cardiovascular: HDS, adequate peripheral perfusion Abdomen: Soft, NTTP, nondistended, large midline incision from prior chole. GU: mild Right CVA tenderness Extremities: warm and well perfused Neuro: Appropriate, no focal neurological deficits  Most Recent Labs: Lab Results  Component Value Date   WBC 13.4 (H) 01/30/2018   HGB 13.7 01/30/2018   HCT 42.8 01/30/2018   PLT 185 01/30/2018    Lab Results  Component Value Date   NA 136 09/05/2017   K 4.9 09/05/2017   CL 98 (L) 09/05/2017   CO2 26 09/05/2017   BUN 15 09/05/2017   CREATININE 0.76 09/05/2017   CALCIUM 9.3 09/05/2017   MG 1.7 07/05/2017    Lab Results  Component Value Date   INR 2.8 (H) 08/27/2007     IMAGING: Ct Renal Stone Study  Result Date: 01/30/2018 CLINICAL DATA:  Acute abdominal flank pain worse on the right EXAM: CT ABDOMEN AND PELVIS WITHOUT CONTRAST TECHNIQUE: Multidetector CT imaging of the abdomen and pelvis was performed following the standard protocol without IV contrast. COMPARISON:  09/04/2017 FINDINGS: Lower chest: Bibasilar atelectasis. Mild cardiomegaly. No pericardial or pleural effusion. Small hiatal hernia. Descending thoracic aorta is tortuous and ectatic with atherosclerosis. Hepatobiliary: No focal liver abnormality is seen. Status post cholecystectomy. No biliary dilatation. Pancreas: Unremarkable. No pancreatic ductal dilatation or surrounding inflammatory changes. Spleen: Normal in size without focal abnormality. Adrenals/Urinary Tract: Normal adrenal glands. Right kidney demonstrates acute perinephric strandy  edema/inflammation. There is associated moderate right hydroureteronephrosis secondary to a proximal obstructing right ureteral calculus measuring 6 mm, image 34 series 2. There is a stable 2.5 cm right kidney lower pole hypodense cyst medially. Left kidney and ureter demonstrate no acute process or obstruction. Left ureter is nondilated. Air noted within the bladder dome.  Bladder otherwise unremarkable. Stomach/Bowel: Negative for bowel obstruction, significant dilatation, ileus, or free air. Appendix not visualized. No fluid collection or abscess. Moderate colonic stool burden. Extensive sigmoid diverticulosis without acute inflammatory process. Vascular/Lymphatic: Atherosclerosis of the aorta. Infrarenal aorta has aneurysm dilatation measuring 3.9 cm. Extensive iliac atherosclerosis as well. No retroperitoneal hemorrhage or hematoma. No adenopathy. Reproductive: Remote hysterectomy. No other pelvic abnormality, fluid collection, ascites, or abscess. Other: No abdominal wall hernia or abnormality. No abdominopelvic  ascites. Musculoskeletal: Bones are osteopenic. Degenerative changes of the spine. Extensive facet arthropathy. T12 and L5 subacute partially healing compression fractures noted when compared to 07/03/2017. IMPRESSION: Right proximal ureteral 6 mm obstructing calculus with associated right hydroureteronephrosis and perinephric/ureteral inflammation and edema. 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 Remote cholecystectomy, appendectomy and hysterectomy Colonic diverticulosis without acute inflammation or complicating feature Subacute T12 and L5 compression fractures, new since 07/03/2017 Electronically Signed   By: Jerilynn Mages.  Shick M.D.   On: 01/30/2018 22:00    ------  Assessment:  84 y.o. female with a 6 mm obstructing Right proximal ureteral stone with signs of sepsis  We discussed emergent renal compression in this setting. I  explained retrograde ureteral stenting, antegrade nephrostomy tube placement as different means to relieve her obstruction. She is amenable to cystoscopy and ureteral stent placement and I believe her clinical status is appropriate for this intervention. Risk and benefits were described.    Recommendations: - plan for emergent Right ureteral stent placement in the OR - informed consent obtained - continue NPO status - Patient received 3.375g Zosyn in ED. This should be adequate peri-op coverage - Appreciate medical consult and admission of this complicated patient following intervention   Thank you for this consult. Please contact the urology consult pager with any further questions/concerns.

## 2018-01-30 NOTE — ED Triage Notes (Signed)
Per ems: Pt coming from home c/o generalized weakness and sharp bilateral flank pain that started 3 days ago. No burning sensation with urination. Hx of kidney infection and UTI. A&Ox4. C/o headache and dizziness as well. Hx of a-fib  300 mL NS given by ems

## 2018-01-30 NOTE — ED Notes (Signed)
ED TO INPATIENT HANDOFF REPORT  Name/Age/Gender [name] 84 y.o. female  Code Status Code Status History    Date Active Date Inactive Code Status Order ID Comments User Context   09/04/2017 1601 09/08/2017 1427 Full Code [id number]  Cristal Ford, DO ED   07/03/2017 0438 07/05/2017 2017 Full Code [id number]  Norval Morton, MD ED   02/02/2017 0159 02/05/2017 1953 Full Code [id number]  Etta Quill, DO ED      Home/SNF/Other Home  Chief Complaint generalized weakness  Level of Care/Admitting Diagnosis ED Disposition    ED Disposition Condition [facility] Area: [facility] [[id number]]  Level of Care: Telemetry [5]  Admit to tele based on following criteria: Monitor for Ischemic changes  Diagnosis: Sepsis [facility]) [[id number]]  Admitting Physician: Rise Patience [phone]  Attending Physician: Rise Patience [phone]  Estimated length of stay: past midnight tomorrow  Certification:: I certify this patient will need inpatient services for at least 2 midnights  PT Class (Do Not Modify): Inpatient [101]  PT Acc Code (Do Not Modify): Private [1]       Medical History Past Medical History:  Diagnosis Date  . [name] (abdominal aortic aneurysm) ([place]) 09/2013   3.6 cm  . Atrial fibrillation ([place])   . Dementia ([place])   . Diabetes mellitus    pre-diabetes  . Diverticulosis   . Hyperlipidemia   . Hypertension   . Nephrolithiasis   . OSA on CPAP   . Osteoarthritis   . Osteopenia    T=  -2.1 in hip  . Rheumatoid arthritis(714.0)   . S/P carpal tunnel release   . Spinal stenosis     Allergies Allergies  Allergen Reactions  . Sulfonamide Derivatives Hives and Rash    IV Location/Drains/Wounds Patient Lines/Drains/Airways Status   Active Line/Drains/Airways    Name:   Placement date:   Placement time:   Site:   Days:   Peripheral IV 01/30/18 Left Antecubital   01/30/18    2030    Antecubital   less than 1   Peripheral IV 01/30/18  Right Antecubital   01/30/18    2128    Antecubital   less than 1          Labs/Imaging Results for orders placed or performed during the hospital encounter of 01/30/18 (from the past 48 hour(s))  CBC WITH DIFFERENTIAL     Status: Abnormal   Collection Time: 01/30/18  8:52 PM  Result Value Ref Range   WBC 13.4 (H) 4.0 - 10.5 K/uL   RBC 4.59 3.87 - 5.11 MIL/uL   Hemoglobin 13.7 12.0 - 15.0 g/dL   HCT 42.8 36.0 - 46.0 %   MCV 93.2 78.0 - 100.0 fL   MCH 29.8 26.0 - 34.0 pg   MCHC 32.0 30.0 - 36.0 g/dL   RDW 13.3 11.5 - 15.5 %   Platelets 185 150 - 400 K/uL   Neutrophils Relative % 83 %   Neutro Abs 11.0 (H) 1.7 - 7.7 K/uL   Lymphocytes Relative 9 %   Lymphs Abs 1.3 0.7 - 4.0 K/uL   Monocytes Relative 8 %   Monocytes Absolute 1.1 (H) 0.1 - 1.0 K/uL   Eosinophils Relative 0 %   Eosinophils Absolute 0.0 0.0 - 0.7 K/uL   Basophils Relative 0 %   Basophils Absolute 0.0 0.0 - 0.1 K/uL    Comment: Performed at [facility], [place] [address], [place], [place] [address]  I-Stat CG4 Lactic Acid, ED  (not at  [facility])     Status: None   Collection Time: 01/30/18  9:18 PM  Result Value Ref Range   Lactic Acid, Venous 1.86 0.5 - 1.9 mmol/L   Ct Renal Stone Study  Result Date: 01/30/2018 CLINICAL DATA:  Acute abdominal flank pain worse on the right EXAM: CT ABDOMEN AND PELVIS WITHOUT CONTRAST TECHNIQUE: Multidetector CT imaging of the abdomen and pelvis was performed following the standard protocol without IV contrast. COMPARISON:  09/04/2017 FINDINGS: Lower chest: Bibasilar atelectasis. Mild cardiomegaly. No pericardial or pleural effusion. Small hiatal hernia. Descending thoracic aorta is tortuous and ectatic with atherosclerosis. Hepatobiliary: No focal liver abnormality is seen. Status post cholecystectomy. No biliary dilatation. Pancreas: Unremarkable. No pancreatic ductal dilatation or surrounding inflammatory changes. Spleen: Normal in size without focal abnormality.  Adrenals/Urinary Tract: Normal adrenal glands. Right kidney demonstrates acute perinephric strandy edema/inflammation. There is associated moderate right hydroureteronephrosis secondary to a proximal obstructing right ureteral calculus measuring 6 mm, image 34 series 2. There is a stable 2.5 cm right kidney lower pole hypodense cyst medially. Left kidney and ureter demonstrate no acute process or obstruction. Left ureter is nondilated. Air noted within the bladder dome.  Bladder otherwise unremarkable. Stomach/Bowel: Negative for bowel obstruction, significant dilatation, ileus, or free air. Appendix not visualized. No fluid collection or abscess. Moderate colonic stool burden. Extensive sigmoid diverticulosis without acute inflammatory process. Vascular/Lymphatic: Atherosclerosis of the aorta. Infrarenal aorta has aneurysm dilatation measuring 3.9 cm. Extensive iliac atherosclerosis as well. No retroperitoneal hemorrhage or hematoma. No adenopathy. Reproductive: Remote hysterectomy. No other pelvic abnormality, fluid collection, ascites, or abscess. Other: No abdominal wall hernia or abnormality. No abdominopelvic ascites. Musculoskeletal: Bones are osteopenic. Degenerative changes of the spine. Extensive facet arthropathy. T12 and L5 subacute partially healing compression fractures noted when compared to 07/03/2017. IMPRESSION: Right proximal ureteral 6 mm obstructing calculus with associated right hydroureteronephrosis and perinephric/ureteral inflammation and edema. 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 Remote cholecystectomy, appendectomy and hysterectomy Colonic diverticulosis without acute inflammation or complicating feature Subacute T12 and L5 compression fractures, new since 07/03/2017 Electronically Signed   By: Jerilynn Mages.  Shick M.D.   On: 01/30/2018 22:00    Pending Labs Unresulted Labs (From admission, onward)    Start     Ordered    01/30/18 2052  Comprehensive metabolic panel  STAT,   STAT     01/30/18 2054   01/30/18 2052  Blood Culture (routine x 2)  BLOOD CULTURE X 2,   STAT     01/30/18 2054   01/30/18 2052  Urinalysis, Routine w reflex microscopic  STAT,   STAT     01/30/18 2054   01/30/18 2052  Lipase, blood  STAT,   STAT     01/30/18 2054          Vitals/Pain Today's Vitals   01/30/18 2037 01/30/18 2044 01/30/18 2045 01/30/18 2130  BP:    137/82  Pulse:    (!) 110  Resp:    (!) 23  Temp:  (!) 102.2 F (39 C)    TempSrc:  Rectal    SpO2:    97%  Weight: 77.1 kg     Height: 5\' 4"  (1.626 m)     PainSc: 4   4      Isolation Precautions No active isolations  Medications Medications  sodium chloride 0.9 % bolus 1,000 mL (1,000 mLs Intravenous New Bag/Given 01/30/18 2148)  acetaminophen (TYLENOL)  tablet 1,000 mg (1,000 mg Oral Given 01/30/18 2123)  piperacillin-tazobactam (ZOSYN) IVPB 3.375 g (0 g Intravenous Stopped 01/30/18 2235)    Mobility manual wheelchair

## 2018-01-30 NOTE — ED Notes (Signed)
Patient transported to CT 

## 2018-01-30 NOTE — ED Provider Notes (Signed)
Medical screening examination/treatment/procedure(s) were conducted as a shared visit with non-physician practitioner(s) and myself.  I personally evaluated the patient during the encounter.  84 year old female with a history of kidney stones presents the emergency department today with bilateral flank pain for last few days.  Found to be febrile, tachycardic and tachypneic here.  Consistent with sepsis.  Antibiotic started code sepsis initiated.  Work-up consistent with likely infected stone. Plan for urology consult and likely medicine admission.  EKG Interpretation  Date/Time:  Tuesday January 30 2018 20:36:24 EDT Ventricular Rate:  112 PR Interval:    QRS Duration: 77 QT Interval:  293 QTC Calculation: 400 R Axis:   48 Text Interpretation:  Atrial fibrillation Low voltage, extremity and precordial leads Baseline wander in lead(s) V6 faster rate, no other changes Confirmed by Merrily Pew [phone]) on 01/30/2018 9:04:08 PM     Layal Javid, Corene Cornea, MD 02/02/18 1622

## 2018-01-30 NOTE — ED Notes (Signed)
Surgeon at bedside. Getting pt ready for surgery

## 2018-01-30 NOTE — Progress Notes (Signed)
She presents today for follow-up of her toenails and calluses.  She states that she is not feeling very well because she has several compression fractures of her vertebrae.  States that her feet hurt all the time.  Objective: Pulses are palpable toenails are long thick yellow dystrophic-like mycotic and very tender.  Multiple reactive hyper keratomas forefoot bilateral.  Assessment: Diabetes mellitus diabetic peripheral neuropathy and pain in limb center onychomycosis and porokeratosis.  Plan: Debridement of reactive hyperkeratosis debridement of toenails 1 through 5 bilateral.

## 2018-01-30 NOTE — H&P (View-Only) (Signed)
Urology Consult Note   Requesting Attending Physician:  Rise Patience, MD Service Providing Consult: Urology  Consulting Attending: Feliciana Rossetti   Reason for Consult:  nephrolithiasis  HPI: [name] is seen in consultation for reasons noted above at the request of Rise Patience, MD for evaluation of right proximal ureteral stone.  This is a 84 y.o. female with a history of dementia, DM, a-fib, recurrent UTIs, RA, OSA on CPAP, [name], HTN, [name], and nephrolithiasis who presents to the ED on 01/30/18 with acute right flank pain. She has also had weakness, HA, and tinnitus x 3 days. Initial workup demonstrated SIRS with tachycardia to 110, fever to 102.2, and tachypnea to 23. Lab work showed a WBC of ~13.4, but normal lactate level.   She had a CT renal protocol performed which revealed a 6 mm obstructing right proximal ureteral stone with associated moderate hydroureteronephrosis and perinephric stranding.   She is accompanied by her daughter on my interview. Daughter provides much of the history. Patient is able to answer some simple questions. Patient has been having several weeks of right flank pain. This was not worked up before. In the last 24 hours she developed rigors and chills which seemed to worsen. This is was prompted ED visit. Her discomfort is mild to moderate. She has not had anything to eat since early this morning.   She has a history of prior stones and reportedly had prior ESWL. She has a midline incision from prior cholecystectomy.  She has not given a urine sample yet.   Past Medical History: Past Medical History:  Diagnosis Date  . [name] (abdominal aortic aneurysm) ([place]) 09/2013   3.6 cm  . Atrial fibrillation ([place])   . Dementia ([place])   . Diabetes mellitus    pre-diabetes  . Diverticulosis   . Hyperlipidemia   . Hypertension   . Nephrolithiasis   . OSA on CPAP   . Osteoarthritis   . Osteopenia    T=  -2.1 in hip  . Rheumatoid  arthritis(714.0)   . S/P carpal tunnel release   . Spinal stenosis     Past Surgical History:  Past Surgical History:  Procedure Laterality Date  . APPENDECTOMY    . CARPAL TUNNEL RELEASE  12/27/2006   Right subcutaneous ulnar nerve transfer -- Right open carpal tunnel release  . CHOLECYSTECTOMY    . FOOT SURGERY    . JOINT REPLACEMENT     BTKR, RTHR  . TOTAL KNEE ARTHROPLASTY  08/21/2007   Right total knee replacement    Medication: Current Facility-Administered Medications  Medication Dose Route Frequency Provider Last Rate Last Dose  . sodium chloride 0.9 % bolus 1,000 mL  1,000 mL Intravenous Once McDonald, Mia A, PA-C 983.6 mL/hr at 01/30/18 2148 1,000 mL at 01/30/18 2148   Current Outpatient Medications  Medication Sig Dispense Refill  . aspirin 81 MG chewable tablet Chew 81 mg by mouth daily.    Marland Kitchen atenolol (TENORMIN) 50 MG tablet Take 1 tablet (50 mg total) by mouth daily. 90 tablet 3  . DULoxetine (CYMBALTA) 30 MG capsule Take 90 mg by mouth every morning.   2  . gabapentin (NEURONTIN) 800 MG tablet TAKE 1 TABLET BY MOUTH EVERY MORNING AND AT LUNCH, 2 TABLETS BY MOUTH EVERY NIGHT AT BEDTIME 360 tablet 4  . oxymorphone (OPANA) 5 MG tablet Take 1 tablet (5 mg total) by mouth 3 (three) times daily. 5 tablet 0  . rosuvastatin (CRESTOR) 40 MG tablet TAKE  1 TABLET BY MOUTH EVERY DAY 90 tablet 3    Allergies: Allergies  Allergen Reactions  . Sulfonamide Derivatives Hives and Rash    Social History: Social History   Tobacco Use  . Smoking status: Never Smoker  . Smokeless tobacco: Never Used  Substance Use Topics  . Alcohol use: No  . Drug use: No    Family History Family History  Problem Relation Age of Onset  . Stroke Father   . Coronary artery disease Son        CABG at age 55  . Heart disease Son   . Heart attack Son   . Stroke Mother   . Hypertension Sister   . Diabetes Brother   . Hypertension Maternal Aunt     Review of Systems 10 systems were  reviewed and are negative except as noted specifically in the HPI.  Objective   Vital signs in last 24 hours: BP 137/82   Pulse (!) 110   Temp (!) 102.2 F (39 C) (Rectal)   Resp (!) 23   Ht 5\' 4"  (1.626 m)   Wt 77.1 kg   SpO2 97%   BMI 29.18 kg/m   Physical Exam General: NAD, A&O, resting, appropriate HEENT: [place]/AT, EOMI, MMM Pulmonary: Normal work of breathing Cardiovascular: HDS, adequate peripheral perfusion Abdomen: Soft, NTTP, nondistended, large midline incision from prior chole. GU: mild Right CVA tenderness Extremities: warm and well perfused Neuro: Appropriate, no focal neurological deficits  Most Recent Labs: Lab Results  Component Value Date   WBC 13.4 (H) 01/30/2018   HGB 13.7 01/30/2018   HCT 42.8 01/30/2018   PLT 185 01/30/2018    Lab Results  Component Value Date   NA 136 09/05/2017   K 4.9 09/05/2017   CL 98 (L) 09/05/2017   CO2 26 09/05/2017   BUN 15 09/05/2017   CREATININE 0.76 09/05/2017   CALCIUM 9.3 09/05/2017   MG 1.7 07/05/2017    Lab Results  Component Value Date   INR 2.8 (H) 08/27/2007     IMAGING: Ct Renal Stone Study  Result Date: 01/30/2018 CLINICAL DATA:  Acute abdominal flank pain worse on the right EXAM: CT ABDOMEN AND PELVIS WITHOUT CONTRAST TECHNIQUE: Multidetector CT imaging of the abdomen and pelvis was performed following the standard protocol without IV contrast. COMPARISON:  09/04/2017 FINDINGS: Lower chest: Bibasilar atelectasis. Mild cardiomegaly. No pericardial or pleural effusion. Small hiatal hernia. Descending thoracic aorta is tortuous and ectatic with atherosclerosis. Hepatobiliary: No focal liver abnormality is seen. Status post cholecystectomy. No biliary dilatation. Pancreas: Unremarkable. No pancreatic ductal dilatation or surrounding inflammatory changes. Spleen: Normal in size without focal abnormality. Adrenals/Urinary Tract: Normal adrenal glands. Right kidney demonstrates acute perinephric strandy  edema/inflammation. There is associated moderate right hydroureteronephrosis secondary to a proximal obstructing right ureteral calculus measuring 6 mm, image 34 series 2. There is a stable 2.5 cm right kidney lower pole hypodense cyst medially. Left kidney and ureter demonstrate no acute process or obstruction. Left ureter is nondilated. Air noted within the bladder dome.  Bladder otherwise unremarkable. Stomach/Bowel: Negative for bowel obstruction, significant dilatation, ileus, or free air. Appendix not visualized. No fluid collection or abscess. Moderate colonic stool burden. Extensive sigmoid diverticulosis without acute inflammatory process. Vascular/Lymphatic: Atherosclerosis of the aorta. Infrarenal aorta has aneurysm dilatation measuring 3.9 cm. Extensive iliac atherosclerosis as well. No retroperitoneal hemorrhage or hematoma. No adenopathy. Reproductive: Remote hysterectomy. No other pelvic abnormality, fluid collection, ascites, or abscess. Other: No abdominal wall hernia or abnormality. No abdominopelvic  ascites. Musculoskeletal: Bones are osteopenic. Degenerative changes of the spine. Extensive facet arthropathy. T12 and L5 subacute partially healing compression fractures noted when compared to 07/03/2017. IMPRESSION: Right proximal ureteral 6 mm obstructing calculus with associated right hydroureteronephrosis and perinephric/ureteral inflammation and edema. 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 Remote cholecystectomy, appendectomy and hysterectomy Colonic diverticulosis without acute inflammation or complicating feature Subacute T12 and L5 compression fractures, new since 07/03/2017 Electronically Signed   By: Jerilynn Mages.  Shick M.D.   On: 01/30/2018 22:00    ------  Assessment:  84 y.o. female with a 6 mm obstructing Right proximal ureteral stone with signs of sepsis  We discussed emergent renal compression in this setting. I  explained retrograde ureteral stenting, antegrade nephrostomy tube placement as different means to relieve her obstruction. She is amenable to cystoscopy and ureteral stent placement and I believe her clinical status is appropriate for this intervention. Risk and benefits were described.    Recommendations: - plan for emergent Right ureteral stent placement in the OR - informed consent obtained - continue NPO status - Patient received 3.375g Zosyn in ED. This should be adequate peri-op coverage - Appreciate medical consult and admission of this complicated patient following intervention   Thank you for this consult. Please contact the urology consult pager with any further questions/concerns.

## 2018-01-30 NOTE — ED Notes (Signed)
Pt placed on purewick 

## 2018-01-30 NOTE — ED Notes (Signed)
Pt being transported to PACU

## 2018-01-30 NOTE — Anesthesia Preprocedure Evaluation (Signed)
Anesthesia Evaluation  Patient identified by MRN, date of birth, ID band Patient awake    Reviewed: Allergy & Precautions, NPO status , Patient's Chart, lab work & pertinent test results  Airway Mallampati: II  TM Distance: >3 FB Neck ROM: Full    Dental no notable dental hx.    Pulmonary neg pulmonary ROS,    Pulmonary exam normal breath sounds clear to auscultation       Cardiovascular hypertension, Pt. on medications and Pt. on home beta blockers + Peripheral Vascular Disease   Rhythm:Regular Rate:Tachycardia     Neuro/Psych Chronic narcotic use negative psych ROS   GI/Hepatic negative GI ROS, Neg liver ROS,   Endo/Other  diabetes  Renal/GU negative Renal ROS  negative genitourinary   Musculoskeletal negative musculoskeletal ROS (+)   Abdominal   Peds negative pediatric ROS (+)  Hematology negative hematology ROS (+)   Anesthesia Other Findings   Reproductive/Obstetrics negative OB ROS                             Anesthesia Physical Anesthesia Plan  ASA: III and emergent  Anesthesia Plan: General   Post-op Pain Management:    Induction: Intravenous  PONV Risk Score and Plan: 3 and Ondansetron, Dexamethasone and Treatment may vary due to age or medical condition  Airway Management Planned: LMA  Additional Equipment:   Intra-op Plan:   Post-operative Plan: Extubation in OR  Informed Consent: I have reviewed the patients History and Physical, chart, labs and discussed the procedure including the risks, benefits and alternatives for the proposed anesthesia with the patient or authorized representative who has indicated his/her understanding and acceptance.   Dental advisory given  Plan Discussed with: CRNA and Surgeon  Anesthesia Plan Comments:         Anesthesia Quick Evaluation

## 2018-01-30 NOTE — H&P (Signed)
History and Physical    [name]:[id number] DOB: [birth year] DOA: 01/30/2018  PCP: [name], MD  Patient coming from: Home.  History obtained from patient's daughter, patient and the ER physician.  Chief Complaint: Right flank pain fever and chills.  HPI: [name] is a 84 y.o. female with history of atrial fibrillation not on anticoagulation secondary to multiple falls, abdominal aortic aneurysm being followed by cardiologist, hyperlipidemia, chronic pain has been experiencing some abdominal pain over the last 1 week as per the patient's daughter.  Pain became acutely worsened with patient indicating the pain was mostly on the right flank.  Did not have any nausea vomiting diarrhea but had some fever and chills.  Did not complain of any chest pain or shortness of breath.  ED Course: In the ER patient was febrile with temperature of 102.2 tachycardic and WBC count of 13.  CT scan of the abdomen shows right-sided ureteral stone in the proximal ureter with hydronephrosis and inflammatory changes.  On-call urologist has been consulted and patient is likely to go for stent placement and admitted for sepsis secondary to obstructing ureteral stone with UTI.  UA still pending.  Blood cultures have been obtained.  Review of Systems: As per HPI, rest all negative.   Past Medical History:  Diagnosis Date  . [name] (abdominal aortic aneurysm) ([place]) 09/2013   3.6 cm  . Atrial fibrillation ([place])   . Dementia ([place])   . Diabetes mellitus    pre-diabetes  . Diverticulosis   . Hyperlipidemia   . Hypertension   . Nephrolithiasis   . OSA on CPAP   . Osteoarthritis   . Osteopenia    T=  -2.1 in hip  . Rheumatoid arthritis(714.0)   . S/P carpal tunnel release   . Spinal stenosis     Past Surgical History:  Procedure Laterality Date  . APPENDECTOMY    . CARPAL TUNNEL RELEASE  12/27/2006   Right subcutaneous ulnar nerve transfer -- Right open carpal tunnel release  .  CHOLECYSTECTOMY    . FOOT SURGERY    . JOINT REPLACEMENT     BTKR, RTHR  . TOTAL KNEE ARTHROPLASTY  08/21/2007   Right total knee replacement     reports that she has never smoked. She has never used smokeless tobacco. She reports that she does not drink alcohol or use drugs.  Allergies  Allergen Reactions  . Sulfonamide Derivatives Hives and Rash    Family History  Problem Relation Age of Onset  . Stroke Father   . Coronary artery disease Son        CABG at age 15  . Heart disease Son   . Heart attack Son   . Stroke Mother   . Hypertension Sister   . Diabetes Brother   . Hypertension Maternal Aunt     Prior to Admission medications   Medication Sig Start Date End Date Taking? Authorizing Provider  aspirin 81 MG chewable tablet Chew 81 mg by mouth daily.   Yes [provider]  atenolol (TENORMIN) 50 MG tablet Take 1 tablet (50 mg total) by mouth daily. 08/24/17  Yes [name], MD  DULoxetine (CYMBALTA) 30 MG capsule Take 90 mg by mouth every morning.  07/29/14  Yes [provider]  gabapentin (NEURONTIN) 800 MG tablet TAKE 1 TABLET BY MOUTH EVERY MORNING AND AT LUNCH, 2 TABLETS BY MOUTH EVERY NIGHT AT BEDTIME 12/25/17  Yes Hyatt, Max T, DPM  oxymorphone (OPANA)  5 MG tablet Take 1 tablet (5 mg total) by mouth 3 (three) times daily. 09/08/17  Yes Charlynne Cousins, MD  rosuvastatin (CRESTOR) 40 MG tablet TAKE 1 TABLET BY MOUTH EVERY DAY 12/25/17  Yes [name], MD    Physical Exam: Vitals:   01/30/18 2130 01/30/18 2200 01/30/18 2230 01/30/18 2300  BP: 137/82 115/73 120/78 119/77  Pulse: (!) 110 97 (!) 107 (!) 108  Resp: (!) 23 (!) 23 20 19   Temp:      TempSrc:      SpO2: 97% 96% 98% 92%  Weight:      Height:          Constitutional: Moderately built and nourished. Vitals:   01/30/18 2130 01/30/18 2200 01/30/18 2230 01/30/18 2300  BP: 137/82 115/73 120/78 119/77  Pulse: (!) 110 97 (!) 107 (!) 108  Resp: (!) 23 (!) 23 20 19   Temp:       TempSrc:      SpO2: 97% 96% 98% 92%  Weight:      Height:       Eyes: Anicteric no pallor. ENMT: No discharge from the ears eyes nose or mouth. Neck: No mass felt.  No neck rigidity.  No JVD appreciated. Respiratory: No rhonchi or crepitations. Cardiovascular: S1-S2 heard tachycardic. Abdomen: Soft nontender bowel sounds present. Musculoskeletal: No edema.  No joint effusion. Skin: No rash.  Skin appears warm. Neurologic: Alert awake oriented to place and person.  Moves all extremities. Psychiatric: Oriented to place and person.   Labs on Admission: I have personally reviewed following labs and imaging studies  CBC: Recent Labs  Lab 01/30/18 2052  WBC 13.4*  NEUTROABS 11.0*  HGB 13.7  HCT 42.8  MCV 93.2  PLT 382   Basic Metabolic Panel: Recent Labs  Lab 01/30/18 2052  NA 140  K 4.6  CL 99  CO2 29  GLUCOSE 114*  BUN 18  CREATININE 1.03*  CALCIUM 9.2   GFR: Estimated Creatinine Clearance: 42.3 mL/min (A) (by C-G formula based on SCr of 1.03 mg/dL (H)). Liver Function Tests: Recent Labs  Lab 01/30/18 2052  AST 22  ALT 13  ALKPHOS 68  BILITOT 1.0  PROT 7.5  ALBUMIN 3.6   Recent Labs  Lab 01/30/18 2052  LIPASE 23   No results for input(s): AMMONIA in the last 168 hours. Coagulation Profile: No results for input(s): INR, PROTIME in the last 168 hours. Cardiac Enzymes: No results for input(s): CKTOTAL, CKMB, CKMBINDEX, TROPONINI in the last 168 hours. BNP (last 3 results) No results for input(s): PROBNP in the last 8760 hours. HbA1C: No results for input(s): HGBA1C in the last 72 hours. CBG: No results for input(s): GLUCAP in the last 168 hours. Lipid Profile: No results for input(s): CHOL, HDL, LDLCALC, TRIG, CHOLHDL, LDLDIRECT in the last 72 hours. Thyroid Function Tests: No results for input(s): TSH, T4TOTAL, FREET4, T3FREE, THYROIDAB in the last 72 hours. Anemia Panel: No results for input(s): VITAMINB12, FOLATE, FERRITIN, TIBC, IRON,  RETICCTPCT in the last 72 hours. Urine analysis:    Component Value Date/Time   COLORURINE YELLOW 10/20/2017 1517   APPEARANCEUR CLOUDY (A) 10/20/2017 1517   LABSPEC 1.015 10/20/2017 1517   PHURINE 6.0 10/20/2017 1517   GLUCOSEU NEGATIVE 10/20/2017 1517   GLUCOSEU NEG mg/dL 03/08/2007 2031   HGBUR TRACE (A) 10/20/2017 1517   HGBUR negative 03/06/2008 0846   BILIRUBINUR NEGATIVE 09/06/2017 0013   KETONESUR NEGATIVE 10/20/2017 1517   PROTEINUR NEGATIVE 10/20/2017 1517  UROBILINOGEN 0.2 09/20/2013 1535   NITRITE POSITIVE (A) 10/20/2017 1517   LEUKOCYTESUR 2+ (A) 10/20/2017 1517   Sepsis Labs: @LABRCNTIP (procalcitonin:4,lacticidven:4) )No results found for this or any previous visit (from the past 240 hour(s)).   Radiological Exams on Admission: Ct Renal Stone Study  Result Date: 01/30/2018 CLINICAL DATA:  Acute abdominal flank pain worse on the right EXAM: CT ABDOMEN AND PELVIS WITHOUT CONTRAST TECHNIQUE: Multidetector CT imaging of the abdomen and pelvis was performed following the standard protocol without IV contrast. COMPARISON:  09/04/2017 FINDINGS: Lower chest: Bibasilar atelectasis. Mild cardiomegaly. No pericardial or pleural effusion. Small hiatal hernia. Descending thoracic aorta is tortuous and ectatic with atherosclerosis. Hepatobiliary: No focal liver abnormality is seen. Status post cholecystectomy. No biliary dilatation. Pancreas: Unremarkable. No pancreatic ductal dilatation or surrounding inflammatory changes. Spleen: Normal in size without focal abnormality. Adrenals/Urinary Tract: Normal adrenal glands. Right kidney demonstrates acute perinephric strandy edema/inflammation. There is associated moderate right hydroureteronephrosis secondary to a proximal obstructing right ureteral calculus measuring 6 mm, image 34 series 2. There is a stable 2.5 cm right kidney lower pole hypodense cyst medially. Left kidney and ureter demonstrate no acute process or obstruction. Left ureter  is nondilated. Air noted within the bladder dome.  Bladder otherwise unremarkable. Stomach/Bowel: Negative for bowel obstruction, significant dilatation, ileus, or free air. Appendix not visualized. No fluid collection or abscess. Moderate colonic stool burden. Extensive sigmoid diverticulosis without acute inflammatory process. Vascular/Lymphatic: Atherosclerosis of the aorta. Infrarenal aorta has aneurysm dilatation measuring 3.9 cm. Extensive iliac atherosclerosis as well. No retroperitoneal hemorrhage or hematoma. No adenopathy. Reproductive: Remote hysterectomy. No other pelvic abnormality, fluid collection, ascites, or abscess. Other: No abdominal wall hernia or abnormality. No abdominopelvic ascites. Musculoskeletal: Bones are osteopenic. Degenerative changes of the spine. Extensive facet arthropathy. T12 and L5 subacute partially healing compression fractures noted when compared to 07/03/2017. IMPRESSION: Right proximal ureteral 6 mm obstructing calculus with associated right hydroureteronephrosis and perinephric/ureteral inflammation and edema. 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 Remote cholecystectomy, appendectomy and hysterectomy Colonic diverticulosis without acute inflammation or complicating feature Subacute T12 and L5 compression fractures, new since 07/03/2017 Electronically Signed   By: Jerilynn Mages.  Shick M.D.   On: 01/30/2018 22:00    EKG: Independently reviewed.  A. fib with heart rate of 112 bpm.  Assessment/Plan Principal Problem:   Sepsis due to gram-negative UTI (HCC) Active Problems:   Essential hypertension   OSA (obstructive sleep apnea)   Dementia (HCC)   RA (rheumatoid arthritis) (HCC)   [name] (abdominal aortic aneurysm) (H[facility])   UTI (urinary tract infection)   A-fib (HCC)   Sepsis (HCC)   Urolithiasis   Hydronephrosis with renal and ureteral calculus obstruction    1. Sepsis secondary to UTI with obstructing  stone -I have discussed with on-call urologist Dr. Donna Christen who is likely to take patient to the OR for stent placement.  Patient will be kept n.p.o. continued on Zosyn follow cultures follow UA urine cultures continue with hydration follow lactate levels procalcitonin levels. 2. A. fib with RVR likely precipitated by sepsis.  For now I have kept patient on scheduled dose of IV metoprolol.  Patient has not been on anticoagulation secondary to risk of falls.  Once patient can take orally restart oral beta-blockers. 3. Hyperlipidemia on statins which could be started after patient can take orally. 4. Infrarenal abdominal aortic aneurysm being followed by cardiologist. 5. Chronic pain takes Opana which can be restarted once patient can take orally. 6.  History of dementia presently no acute issues. 7. Sleep apnea on CPAP.   DVT prophylaxis: SCDs in anticipation of procedure. Code Status: Full code confirmed with patient's daughter. Family Communication: Patient's daughter. Disposition Plan: Home. Consults called: Urologist. Admission status: Inpatient.   Rise Patience MD [facility] Pager [phone].  If 7PM-7AM, please contact night-coverage [email or web address] Password [email or web address]  01/30/2018, 11:11 PM

## 2018-01-30 NOTE — ED Notes (Signed)
Admitting MD at bedside.

## 2018-01-30 NOTE — Op Note (Deleted)
PATIENT:  [name]  Preoperative diagnosis:  1. Right ureteral stone   Postoperative diagnosis:  1. Right ureteral stone  Procedure:  1. Cystoscopy 2. Right ureteral stent placement (50F x 24 cm JJ) without string 3. Right retrograde pyelography with interpretation   Surgeon: Louis Meckel, M.D.  Resident: Basilio Cairo, MD  Anesthesia: General  Complications: None  EBL: Minimal  Specimens: None  Indication: [name] is a 84 y.o. female with a 6 mm obstructing right proximal ureteral stone with signs of sepsis. After reviewing the management options for treatment, they have elected to proceed with the above surgical procedure(s). We have discussed the potential benefits and risks of the procedure, side effects of the proposed treatment, the likelihood of the patient achieving the goals of the procedure, and any potential problems that might occur during the procedure or recuperation. Informed consent has been obtained.  Findings:  Right RPG demonstrated mild hydroureteronephrosis Right ureteral stone was difficult to visualize on plain film Turbid but nonpurulent hydronephrotic drip Successful Right ureteral stent placement without string  Description of procedure:   The patient was taken to the operating room and general anesthesia was induced.  The patient was placed in the dorsal lithotomy position, prepped and draped in the usual sterile fashion, and preoperative antibiotics were administered. A preoperative time-out was performed.   Cystourethroscopy was performed.  The patient's urethra was examined and was normal. The bladder was then systematically examined in its entirety. There was no evidence for any bladder tumors, stones, or other mucosal pathology.    Attention then turned to the right ureteral orifice. A sensor wire was passed into the renal pelvis. A 66fr open ended catheter was advanced to the pelvis. A proximal culture was obtained. Next,  Omnipaque contrast was injected through the ureteral catheter and a retrograde pyelogram was performed with the above findings.  A Sensor guidewire was then re-advanced to the right renal pelvis under fluoroscopic guidance. Next, a 63fr x 24cm JJ ureteral stent without dangler was advanced into the renal pelvis. The stent was positioned appropriately under fluoroscopic and cystoscopic guidance.  The wire was then removed with an adequate stent curl noted in the renal pelvis as well as in the bladder.  A foley catheter was inserted into the bladder.  The patient appeared to tolerate the procedure well and without complications.  The patient was able to be awakened and transferred to the recovery unit in satisfactory condition.   Plan:  - Admit to medicine - Follow up urine culture - Continue on empiric antibiotics until culture results - We will arrange outpatient follow up to discuss definitive stone treatment

## 2018-01-31 ENCOUNTER — Inpatient Hospital Stay (HOSPITAL_COMMUNITY): Payer: Medicare Other

## 2018-01-31 ENCOUNTER — Encounter (HOSPITAL_COMMUNITY): Payer: Self-pay | Admitting: Urology

## 2018-01-31 DIAGNOSIS — I10 Essential (primary) hypertension: Secondary | ICD-10-CM

## 2018-01-31 DIAGNOSIS — N201 Calculus of ureter: Secondary | ICD-10-CM

## 2018-01-31 DIAGNOSIS — Z1612 Extended spectrum beta lactamase (ESBL) resistance: Secondary | ICD-10-CM

## 2018-01-31 DIAGNOSIS — F0390 Unspecified dementia without behavioral disturbance: Secondary | ICD-10-CM

## 2018-01-31 DIAGNOSIS — K5900 Constipation, unspecified: Secondary | ICD-10-CM | POA: Diagnosis present

## 2018-01-31 DIAGNOSIS — G4733 Obstructive sleep apnea (adult) (pediatric): Secondary | ICD-10-CM

## 2018-01-31 DIAGNOSIS — I4891 Unspecified atrial fibrillation: Secondary | ICD-10-CM

## 2018-01-31 DIAGNOSIS — A499 Bacterial infection, unspecified: Secondary | ICD-10-CM

## 2018-01-31 DIAGNOSIS — N10 Acute tubulo-interstitial nephritis: Secondary | ICD-10-CM | POA: Diagnosis present

## 2018-01-31 DIAGNOSIS — R509 Fever, unspecified: Secondary | ICD-10-CM | POA: Diagnosis present

## 2018-01-31 LAB — CBC WITH DIFFERENTIAL/PLATELET
Basophils Absolute: 0 10*3/uL (ref 0.0–0.1)
Basophils Relative: 0 %
Eosinophils Absolute: 0 10*3/uL (ref 0.0–0.7)
Eosinophils Relative: 0 %
HCT: 36.8 % (ref 36.0–46.0)
Hemoglobin: 11.6 g/dL — ABNORMAL LOW (ref 12.0–15.0)
Lymphocytes Relative: 11 %
Lymphs Abs: 1.6 10*3/uL (ref 0.7–4.0)
MCH: 29.1 pg (ref 26.0–34.0)
MCHC: 31.5 g/dL (ref 30.0–36.0)
MCV: 92.5 fL (ref 78.0–100.0)
Monocytes Absolute: 0.5 10*3/uL (ref 0.1–1.0)
Monocytes Relative: 4 %
Neutro Abs: 11.9 10*3/uL — ABNORMAL HIGH (ref 1.7–7.7)
Neutrophils Relative %: 85 %
Platelets: 178 10*3/uL (ref 150–400)
RBC: 3.98 MIL/uL (ref 3.87–5.11)
RDW: 13.5 % (ref 11.5–15.5)
WBC: 14 10*3/uL — ABNORMAL HIGH (ref 4.0–10.5)

## 2018-01-31 LAB — BLOOD CULTURE ID PANEL (REFLEXED)

## 2018-01-31 LAB — COMPREHENSIVE METABOLIC PANEL
ALT: 13 U/L (ref 0–44)
AST: 19 U/L (ref 15–41)
Albumin: 3.1 g/dL — ABNORMAL LOW (ref 3.5–5.0)
Alkaline Phosphatase: 59 U/L (ref 38–126)
Anion gap: 11 (ref 5–15)
BUN: 18 mg/dL (ref 8–23)
CO2: 26 mmol/L (ref 22–32)
Calcium: 8.6 mg/dL — ABNORMAL LOW (ref 8.9–10.3)
Chloride: 103 mmol/L (ref 98–111)
Creatinine, Ser: 1.06 mg/dL — ABNORMAL HIGH (ref 0.44–1.00)
GFR calc Af Amer: 55 mL/min — ABNORMAL LOW (ref >60)
GFR calc non Af Amer: 48 mL/min — ABNORMAL LOW (ref >60)
Glucose, Bld: 132 mg/dL — ABNORMAL HIGH (ref 70–99)
Potassium: 4.1 mmol/L (ref 3.5–5.1)
Sodium: 140 mmol/L (ref 135–145)
Total Bilirubin: 0.9 mg/dL (ref 0.3–1.2)
Total Protein: 6.5 g/dL (ref 6.5–8.1)

## 2018-01-31 LAB — TYPE AND SCREEN
ABO/RH(D): A POS
Antibody Screen: NEGATIVE

## 2018-01-31 LAB — URINALYSIS, ROUTINE W REFLEX MICROSCOPIC
Bilirubin Urine: NEGATIVE
Glucose, UA: NEGATIVE mg/dL
Ketones, ur: NEGATIVE mg/dL
Nitrite: NEGATIVE
Protein, ur: 30 mg/dL — AB
Specific Gravity, Urine: 1.003 — ABNORMAL LOW (ref 1.005–1.030)
WBC, UA: >50 WBC/hpf — ABNORMAL HIGH (ref 0–5)
pH: 7 (ref 5.0–8.0)

## 2018-01-31 LAB — PROTIME-INR
INR: 1.22
Prothrombin Time: 15.3 seconds — ABNORMAL HIGH (ref 11.4–15.2)

## 2018-01-31 LAB — ABO/RH: ABO/RH(D): A POS

## 2018-01-31 LAB — PROCALCITONIN: Procalcitonin: 1.54 ng/mL

## 2018-01-31 LAB — APTT: aPTT: 31 seconds (ref 24–36)

## 2018-01-31 LAB — GLUCOSE, CAPILLARY: Glucose-Capillary: 122 mg/dL — ABNORMAL HIGH (ref 70–99)

## 2018-01-31 LAB — LACTIC ACID, PLASMA: Lactic Acid, Venous: 1.6 mmol/L (ref 0.5–1.9)

## 2018-01-31 MED ORDER — HEPARIN SODIUM (PORCINE) 5000 UNIT/ML IJ SOLN
5000.0000 [IU] | Freq: Three times a day (TID) | INTRAMUSCULAR | Status: DC
  Administered 2018-01-31 – 2018-02-03 (×9): 5000 [IU] via SUBCUTANEOUS
  Filled 2018-01-31 (×9): qty 1

## 2018-01-31 MED ORDER — SODIUM CHLORIDE 0.9 % IV SOLN
INTRAVENOUS | Status: AC
  Administered 2018-01-31 – 2018-02-02 (×4): via INTRAVENOUS

## 2018-01-31 MED ORDER — ROSUVASTATIN CALCIUM 20 MG PO TABS
40.0000 mg | ORAL_TABLET | Freq: Every day | ORAL | Status: DC
  Administered 2018-01-31 – 2018-02-03 (×4): 40 mg via ORAL
  Filled 2018-01-31 (×4): qty 2

## 2018-01-31 MED ORDER — DEXAMETHASONE SODIUM PHOSPHATE 10 MG/ML IJ SOLN
INTRAMUSCULAR | Status: DC | PRN
  Administered 2018-01-31: 10 mg via INTRAVENOUS

## 2018-01-31 MED ORDER — LIDOCAINE 2% (20 MG/ML) 5 ML SYRINGE
INTRAMUSCULAR | Status: DC | PRN
  Administered 2018-01-30: 50 mg via INTRAVENOUS

## 2018-01-31 MED ORDER — MAGNESIUM HYDROXIDE 400 MG/5ML PO SUSP
15.0000 mL | Freq: Every day | ORAL | Status: DC
  Administered 2018-01-31 – 2018-02-03 (×4): 15 mL via ORAL
  Filled 2018-01-31 (×4): qty 30

## 2018-01-31 MED ORDER — FENTANYL CITRATE (PF) 100 MCG/2ML IJ SOLN
INTRAMUSCULAR | Status: DC | PRN
  Administered 2018-01-31: 25 ug via INTRAVENOUS

## 2018-01-31 MED ORDER — GABAPENTIN 400 MG PO CAPS
800.0000 mg | ORAL_CAPSULE | Freq: Three times a day (TID) | ORAL | Status: DC
  Administered 2018-01-31 – 2018-02-03 (×9): 800 mg via ORAL
  Filled 2018-01-31 (×6): qty 2
  Filled 2018-01-31: qty 8
  Filled 2018-01-31 (×3): qty 2

## 2018-01-31 MED ORDER — SODIUM CHLORIDE 0.9 % IR SOLN
Status: DC | PRN
  Administered 2018-01-31: 3000 mL

## 2018-01-31 MED ORDER — PROMETHAZINE HCL 25 MG/ML IJ SOLN: 6.2500 mg | INTRAMUSCULAR | Status: DC | PRN

## 2018-01-31 MED ORDER — LACTATED RINGERS IV SOLN: INTRAVENOUS | Status: DC

## 2018-01-31 MED ORDER — SODIUM CHLORIDE 0.9 % IV SOLN
1.0000 g | Freq: Two times a day (BID) | INTRAVENOUS | Status: DC
  Administered 2018-01-31 – 2018-02-03 (×6): 1 g via INTRAVENOUS
  Filled 2018-01-31 (×7): qty 1

## 2018-01-31 MED ORDER — SENNOSIDES-DOCUSATE SODIUM 8.6-50 MG PO TABS
2.0000 | ORAL_TABLET | Freq: Two times a day (BID) | ORAL | Status: DC
  Administered 2018-01-31 – 2018-02-03 (×7): 2 via ORAL
  Filled 2018-01-31 (×8): qty 2

## 2018-01-31 MED ORDER — FENTANYL CITRATE (PF) 100 MCG/2ML IJ SOLN: 25.0000 ug | INTRAMUSCULAR | Status: DC | PRN

## 2018-01-31 MED ORDER — PROPOFOL 10 MG/ML IV BOLUS
INTRAVENOUS | Status: DC | PRN
  Administered 2018-01-30: 100 mg via INTRAVENOUS

## 2018-01-31 MED ORDER — FLEET ENEMA 7-19 GM/118ML RE ENEM
1.0000 | ENEMA | Freq: Once | RECTAL | Status: AC
  Administered 2018-01-31: 1 via RECTAL
  Filled 2018-01-31: qty 1

## 2018-01-31 MED ORDER — MORPHINE SULFATE 15 MG PO TABS
15.0000 mg | ORAL_TABLET | Freq: Three times a day (TID) | ORAL | Status: DC
  Administered 2018-01-31 – 2018-02-03 (×9): 15 mg via ORAL
  Filled 2018-01-31 (×10): qty 1

## 2018-01-31 MED ORDER — ONDANSETRON HCL 4 MG/2ML IJ SOLN
INTRAMUSCULAR | Status: DC | PRN
  Administered 2018-01-31: 4 mg via INTRAVENOUS

## 2018-01-31 MED ORDER — GABAPENTIN 800 MG PO TABS: 800.0000 mg | ORAL_TABLET | Freq: Three times a day (TID) | ORAL | Status: DC

## 2018-01-31 MED ORDER — ASPIRIN 81 MG PO CHEW
81.0000 mg | CHEWABLE_TABLET | Freq: Every day | ORAL | Status: DC
  Administered 2018-01-31 – 2018-02-03 (×4): 81 mg via ORAL
  Filled 2018-01-31 (×4): qty 1

## 2018-01-31 MED ORDER — ATENOLOL 50 MG PO TABS
50.0000 mg | ORAL_TABLET | Freq: Every day | ORAL | Status: DC
  Administered 2018-01-31 – 2018-02-03 (×4): 50 mg via ORAL
  Filled 2018-01-31 (×4): qty 1

## 2018-01-31 MED ORDER — DULOXETINE HCL 60 MG PO CPEP
90.0000 mg | ORAL_CAPSULE | Freq: Every morning | ORAL | Status: DC
  Administered 2018-01-31 – 2018-02-03 (×4): 90 mg via ORAL
  Filled 2018-01-31 (×4): qty 1

## 2018-01-31 NOTE — Consult Note (Signed)
S/p right ureteral stent placement for infected stone.  Vitals are improving.  Seems to be getting better.  Recommend culture directed antibiotics x 2 weeks.  We will get her scheduled for removal of her stone within the next 2-3 weeks.  Remaining care per hospitalist.

## 2018-01-31 NOTE — Progress Notes (Signed)
PHARMACY NOTE -  ANTIBIOTIC RENAL DOSE ADJUSTMENT   Request received for Pharmacy to assist with antibiotic renal dose adjustment.  Patient has been initiated on meropenem for E coli bacteremia. SCr 1.06, estimated CrCl 42 ml/min Will adjust meropenem dosing to 1 g iv q 12h. Will sign off at this time.  Please reconsult if a change in clinical status warrants re-evaluation of dosage.  Ulice Dash, PharmD Clinical Pharmacist Pager # [phone]

## 2018-01-31 NOTE — Progress Notes (Signed)
PROGRESS NOTE    [name]  NID:[id number] DOB: [birth year] DOA: 01/30/2018 PCP: [name], MD   Brief Narrative:  [name] is a 84 y.o. female with history of chronic atrial fibrillation not on anticoagulation secondary to multiple falls, abdominal aortic aneurysm being followed by cardiologist, HTN, hyperlipidemia, OSA, dementia, chronic pain p/w worsening right flank pain, fever, chills and was admitted for sepsis due to acute pyelonephritis, with high fever, leukocytosis, Afib RVR. Started on IVF and iv zosyn empirically. Both urine and blood culture actually grew ESBL E coli. Antibiotics switched to iv Meropenem. CT abdomen pelvis (stone protocol) revealed right proximal ureteral 6 mm obstructing calculus with associated right hydroureteronephrosis and perinephric/ureteral inflammation and edema. Urology consulted, performed emergent right ureteral stent in OR and plan for antibiotics for 2 weeks then schedule for removal of her stone after completion of antibiotics.   Assessment & Plan:   Principal Problem:   Sepsis due to gram-negative UTI [facility]) Active Problems:   Essential hypertension   OSA (obstructive sleep apnea)   Dementia (HCC)   [name] (abdominal aortic aneurysm) (HCC)   ESBL (extended spectrum beta-lactamase) producing bacteria infection   Atrial fibrillation with RVR (HCC)   Urolithiasis   Hydronephrosis with renal and ureteral calculus obstruction   Fever   Acute pyelonephritis   Constipation   Plan: -due to blood and urine culture both positive for ESBL E Coli which iv zosyn will not be effective, will change zosyn to iv meropenem and plan for 2 weeks -continue to monitor fever and trending wbc -consider repeat blood culture 2 days post starting of meropenem; if culture cleared, plan for PICC line placement and either home or SNF abx infusion -s/p right ureteral stent, once pt completes 2 weeks of meropenem, urologist will schedule for stone removal as  outpatient; continue 2 more days of gentle IV NS -advance to low salt diet -resume atenolol and other home meds; Afib is now rate controlled -start stool softener and laxatives for constipation -PT/OT eval; OOB to chair tid with meals -CPAP qHS  DVT prophylaxis: heparin [place] Code Status: full code Family Communication: no family in room Disposition Plan:  Pending PT/OT eval   Consultants:   urology  Procedures: ureteral stent placement   Antimicrobials: meropenem    Subjective: No fever, chills, back/flank pain, nausea or vomiting; tolerating diet; fever subsiding. Cultures positive for ESBL E coli. C/o constipation and not having a BM for several days  Objective: Vitals:   01/31/18 0100 01/31/18 0120 01/31/18 0524 01/31/18 1309  BP: 103/75  119/80 113/72  Pulse: 91  90 90  Resp: 15  12 16   Temp: 98.4 F (36.9 C)  98 F (36.7 C) 98.4 F (36.9 C)  TempSrc:   Oral Oral  SpO2: 98% 96% 95%   Weight:  81 kg    Height:        Intake/Output Summary (Last 24 hours) at 01/31/2018 1455 Last data filed at 01/31/2018 0954 Gross per 24 hour  Intake 2434.37 ml  Output 700 ml  Net 1734.37 ml   Filed Weights   01/30/18 2037 01/31/18 0120  Weight: 77.1 kg 81 kg    Examination:  General exam: Appears calm and comfortable  Respiratory system: Clear to auscultation. Respiratory effort normal. Cardiovascular system: irregular rhythm, mild tachycardia. No JVD, murmurs, rubs, gallops or clicks. No pedal edema. Gastrointestinal system: Abdomen is nondistended, soft and nontender. No organomegaly or masses felt. Normal bowel sounds heard. Central nervous system: Alert  and oriented. No focal neurological deficits. Extremities: Symmetric 5 x 5 power. Skin: No rashes, lesions or ulcers Psychiatry: Judgement and insight appear normal. Mood & affect appropriate.     Data Reviewed: I have personally reviewed following labs and imaging studies  CBC: Recent Labs  Lab 01/30/18 2052  01/31/18 0124  WBC 13.4* 14.0*  NEUTROABS 11.0* 11.9*  HGB 13.7 11.6*  HCT 42.8 36.8  MCV 93.2 92.5  PLT 185 001   Basic Metabolic Panel: Recent Labs  Lab 01/30/18 2052 01/31/18 0124  NA 140 140  K 4.6 4.1  CL 99 103  CO2 29 26  GLUCOSE 114* 132*  BUN 18 18  CREATININE 1.03* 1.06*  CALCIUM 9.2 8.6*   GFR: Estimated Creatinine Clearance: 42.1 mL/min (A) (by C-G formula based on SCr of 1.06 mg/dL (H)). Liver Function Tests: Recent Labs  Lab 01/30/18 2052 01/31/18 0124  AST 22 19  ALT 13 13  ALKPHOS 68 59  BILITOT 1.0 0.9  PROT 7.5 6.5  ALBUMIN 3.6 3.1*   Recent Labs  Lab 01/30/18 2052  LIPASE 23   No results for input(s): AMMONIA in the last 168 hours. Coagulation Profile: Recent Labs  Lab 01/31/18 0124  INR 1.22   Cardiac Enzymes: No results for input(s): CKTOTAL, CKMB, CKMBINDEX, TROPONINI in the last 168 hours. BNP (last 3 results) No results for input(s): PROBNP in the last 8760 hours. HbA1C: No results for input(s): HGBA1C in the last 72 hours. CBG: Recent Labs  Lab 01/31/18 0053  GLUCAP 122*   Lipid Profile: No results for input(s): CHOL, HDL, LDLCALC, TRIG, CHOLHDL, LDLDIRECT in the last 72 hours. Thyroid Function Tests: No results for input(s): TSH, T4TOTAL, FREET4, T3FREE, THYROIDAB in the last 72 hours. Anemia Panel: No results for input(s): VITAMINB12, FOLATE, FERRITIN, TIBC, IRON, RETICCTPCT in the last 72 hours. Sepsis Labs: Recent Labs  Lab 01/30/18 2118 01/31/18 0124  PROCALCITON  --  1.54  LATICACIDVEN 1.86 1.6    Recent Results (from the past 240 hour(s))  Blood Culture (routine x 2)     Status: None (Preliminary result)   Collection Time: 01/30/18  8:57 PM  Result Value Ref Range Status   Specimen Description   Final    BLOOD RIGHT WRIST Performed at [facility], [place] [address] [place], [place] [address]    Special Requests   Final    BOTTLES DRAWN AEROBIC AND ANAEROBIC Blood Culture adequate  volume Performed at [facility] [address], [place], [place] [address]    Culture  Setup Time   Final    GRAM NEGATIVE RODS IN BOTH AEROBIC AND ANAEROBIC BOTTLES Organism ID to follow CRITICAL RESULT CALLED TO, READ BACK BY AND VERIFIED WITH: Karie Kirks PharmD 13:05 01/31/18 ([email or web address]) Performed at [facility], [address] [place], [place] [address]    Culture GRAM NEGATIVE RODS  Final   Report Status PENDING  Incomplete  Blood Culture ID Panel (Reflexed)     Status: Abnormal   Collection Time: 01/30/18  8:57 PM  Result Value Ref Range Status   Enterococcus species NOT DETECTED NOT DETECTED Final   Listeria monocytogenes NOT DETECTED NOT DETECTED Final   Staphylococcus species NOT DETECTED NOT DETECTED Final   Staphylococcus aureus NOT DETECTED NOT DETECTED Final   Streptococcus species NOT DETECTED NOT DETECTED Final   Streptococcus agalactiae NOT DETECTED NOT DETECTED Final   Streptococcus pneumoniae NOT DETECTED NOT DETECTED Final   Streptococcus pyogenes NOT DETECTED  NOT DETECTED Final   Acinetobacter baumannii NOT DETECTED NOT DETECTED Final   Enterobacteriaceae species DETECTED (A) NOT DETECTED Final    Comment: Enterobacteriaceae represent a large family of gram-negative bacteria, not a single organism. CRITICAL RESULT CALLED TO, READ BACK BY AND VERIFIED WITH: Karie Kirks PharmD 13:05 01/31/18 ([email or web address])    Enterobacter cloacae complex NOT DETECTED NOT DETECTED Final   Escherichia coli DETECTED (A) NOT DETECTED Final    Comment: CRITICAL RESULT CALLED TO, READ BACK BY AND VERIFIED WITH: Karie Kirks PharmD 13:05 01/31/18 ([email or web address])    Klebsiella oxytoca NOT DETECTED NOT DETECTED Final   Klebsiella pneumoniae NOT DETECTED NOT DETECTED Final   Proteus species NOT DETECTED NOT DETECTED Final   Serratia marcescens NOT DETECTED NOT DETECTED Final   Carbapenem resistance NOT DETECTED NOT DETECTED Final   Haemophilus influenzae NOT DETECTED NOT DETECTED Final    Neisseria meningitidis NOT DETECTED NOT DETECTED Final   Pseudomonas aeruginosa NOT DETECTED NOT DETECTED Final   Candida albicans NOT DETECTED NOT DETECTED Final   Candida glabrata NOT DETECTED NOT DETECTED Final   Candida krusei NOT DETECTED NOT DETECTED Final   Candida parapsilosis NOT DETECTED NOT DETECTED Final   Candida tropicalis NOT DETECTED NOT DETECTED Final    Comment: Performed at [facility], [place] [address] [place], [place] [address]  Blood Culture (routine x 2)     Status: None (Preliminary result)   Collection Time: 01/30/18  9:12 PM  Result Value Ref Range Status   Specimen Description   Final    BLOOD RIGHT ANTECUBITAL Performed at [facility] [address] [place], [place] [address]    Special Requests   Final    BOTTLES DRAWN AEROBIC AND ANAEROBIC Blood Culture adequate volume Performed at [facility] [address] [place], [place] [address]    Culture  Setup Time   Final    GRAM NEGATIVE RODS IN BOTH AEROBIC AND ANAEROBIC BOTTLES CRITICAL VALUE NOTED.  VALUE IS CONSISTENT WITH PREVIOUSLY REPORTED AND CALLED VALUE. Performed at [facility], [place] [address] [place],  [address]    Culture GRAM NEGATIVE RODS  Final   Report Status PENDING  Incomplete         Radiology Studies: Dg C-arm 1-60 Min-no Report  Result Date: 01/31/2018 Fluoroscopy was utilized by the requesting physician.  No radiographic interpretation.   Ct Renal Stone Study  Result Date: 01/30/2018 CLINICAL DATA:  Acute abdominal flank pain worse on the right EXAM: CT ABDOMEN AND PELVIS WITHOUT CONTRAST TECHNIQUE: Multidetector CT imaging of the abdomen and pelvis was performed following the standard protocol without IV contrast. COMPARISON:  09/04/2017 FINDINGS: Lower chest: Bibasilar atelectasis. Mild cardiomegaly. No pericardial or pleural effusion. Small hiatal hernia. Descending thoracic aorta is tortuous and ectatic with  atherosclerosis. Hepatobiliary: No focal liver abnormality is seen. Status post cholecystectomy. No biliary dilatation. Pancreas: Unremarkable. No pancreatic ductal dilatation or surrounding inflammatory changes. Spleen: Normal in size without focal abnormality. Adrenals/Urinary Tract: Normal adrenal glands. Right kidney demonstrates acute perinephric strandy edema/inflammation. There is associated moderate right hydroureteronephrosis secondary to a proximal obstructing right ureteral calculus measuring 6 mm, image 34 series 2. There is a stable 2.5 cm right kidney lower pole hypodense cyst medially. Left kidney and ureter demonstrate no acute process or obstruction. Left ureter is nondilated. Air noted within the bladder dome.  Bladder otherwise unremarkable. Stomach/Bowel: Negative for bowel obstruction, significant dilatation, ileus, or free air. Appendix not visualized. No fluid  collection or abscess. Moderate colonic stool burden. Extensive sigmoid diverticulosis without acute inflammatory process. Vascular/Lymphatic: Atherosclerosis of the aorta. Infrarenal aorta has aneurysm dilatation measuring 3.9 cm. Extensive iliac atherosclerosis as well. No retroperitoneal hemorrhage or hematoma. No adenopathy. Reproductive: Remote hysterectomy. No other pelvic abnormality, fluid collection, ascites, or abscess. Other: No abdominal wall hernia or abnormality. No abdominopelvic ascites. Musculoskeletal: Bones are osteopenic. Degenerative changes of the spine. Extensive facet arthropathy. T12 and L5 subacute partially healing compression fractures noted when compared to 07/03/2017. IMPRESSION: Right proximal ureteral 6 mm obstructing calculus with associated right hydroureteronephrosis and perinephric/ureteral inflammation and edema. 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 Remote cholecystectomy, appendectomy and hysterectomy Colonic diverticulosis without  acute inflammation or complicating feature Subacute T12 and L5 compression fractures, new since 07/03/2017 Electronically Signed   By: Jerilynn Mages.  Shick M.D.   On: 01/30/2018 22:00        Scheduled Meds: . aspirin  81 mg Oral Daily  . atenolol  50 mg Oral Daily  . DULoxetine  90 mg Oral q morning - 10a  . gabapentin  800 mg Oral TID  . magnesium hydroxide  15 mL Oral Daily  . oxymorphone  5 mg Oral TID  . rosuvastatin  40 mg Oral Daily  . senna-docusate  2 tablet Oral BID  . sodium phosphate  1 enema Rectal Once   Continuous Infusions: . sodium chloride    . meropenem (MERREM) IV 1 g (01/31/18 1403)     LOS: 1 day    Time spent: 35 min    Paticia Stack, MD [facility] Pager [phone]  If 7PM-7AM, please contact night-coverage [email or web address] Password [email or web address] 01/31/2018, 2:55 PM

## 2018-01-31 NOTE — Discharge Instructions (Signed)
DISCHARGE INSTRUCTIONS FOR KIDNEY STONE/URETERAL STENT   MEDICATIONS:  1.  Resume all your other meds from home - except do not take any extra narcotic pain meds that you may have at home.   ACTIVITY:  1. No strenuous activity x 1week  2. No driving while on narcotic pain medications  3. Drink plenty of water  4. Continue to walk at home - you can still get blood clots when you are at home, so keep active, but don't over do it.  5. May return to work/school tomorrow or when you feel ready   BATHING:  1. You can shower and we recommend daily showers   SIGNS/SYMPTOMS TO CALL:  Please call us if you have a fever greater than 101.5, uncontrolled nausea/vomiting, uncontrolled pain, dizziness, unable to urinate, bloody urine, chest pain, shortness of breath, leg swelling, leg pain, redness around wound, drainage from wound, or any other concerns or questions.   You can reach [place] at [phone].   FOLLOW-UP:  1. We will schedule you for surgery to remove your stone within the next 2-3 weeks.

## 2018-01-31 NOTE — Care Management Note (Signed)
Case Management Note  Patient Details  Name: ANNESHA DELGRECO MRN: [id number] Date of Birth: [birth year]  Subjective/Objective: Admitted w/Sepsis. From home w/family. PT cons-await recc.                   Action/Plan:dc home w/HHC.   Expected Discharge Date:  (unknown)               Expected Discharge Plan:  [place]  In-House Referral:     Discharge planning Services  CM Consult  Post Acute Care Choice:    Choice offered to:     DME Arranged:    DME Agency:     HH Arranged:    HH Agency:     Status of Service:  In process, will continue to follow  If discussed at Long Length of Stay Meetings, dates discussed:    Additional Comments:  Dessa Phi, RN 01/31/2018, 11:26 AM

## 2018-01-31 NOTE — Progress Notes (Signed)
Checked on Pt to see if she was ready for her CPAP since RN has given her nighttime meds.  Pt stated she was not ready yet and requested I come back later.  RT will check on Pt in a few hours.  Pt encouraged to have RN call RT should she desire it earlier.

## 2018-01-31 NOTE — Plan of Care (Signed)
Pt temp has returned to normal

## 2018-01-31 NOTE — Op Note (Signed)
Preoperative diagnosis:  1. Right infected stone   Postoperative diagnosis:  1. same   Procedure:  1. Cystoscopy 2. right ureteral stent placement 3. right retrograde pyelography with interpretation   Surgeon: Ardis Hughs, MD  Anesthesia: General  Complications: None  Intraoperative findings:  right retrograde pyelography demonstrated a filling defect within the right ureter consistent with the patient's known calculus without other abnormalities.  EBL: Minimal  Specimens: urine from right renal pelvis  Indication: [name] is a 84 y.o. patient with infected right stone. After reviewing the management options for treatment, he elected to proceed with the above surgical procedure(s). We have discussed the potential benefits and risks of the procedure, side effects of the proposed treatment, the likelihood of the patient achieving the goals of the procedure, and any potential problems that might occur during the procedure or recuperation. Informed consent has been obtained.  Description of procedure:  The patient was taken to the operating room and general anesthesia was induced.  The patient was placed in the dorsal lithotomy position, prepped and draped in the usual sterile fashion, and preoperative antibiotics were administered. A preoperative time-out was performed.   Cystourethroscopy was performed.  The patient's urethra was examined and was normal. The bladder was then systematically examined in its entirety. There was no evidence for any bladder tumors, stones, or other mucosal pathology.    Attention then turned to the rightureteral orifice and a ureteral catheter was used to intubate the ureteral orifice.  Omnipaque contrast was injected through the ureteral catheter and a retrograde pyelogram was performed with findings as dictated above.  A 0.38 sensor guidewire was then advanced up the right ureter into the renal pelvis under fluoroscopic guidance.  The wire  was then backloaded through the cystoscope and a ureteral stent was advance over the wire using Seldinger technique.  The stent was positioned appropriately under fluoroscopic and cystoscopic guidance.  The wire was then removed with an adequate stent curl noted in the renal pelvis as well as in the bladder.  The bladder was then emptied and the procedure ended.  The patient appeared to tolerate the procedure well and without complications.  The patient was able to be awakened and transferred to the recovery unit in satisfactory condition.    Ardis Hughs, M.D.

## 2018-01-31 NOTE — Transfer of Care (Signed)
Immediate Anesthesia Transfer of Care Note  Patient: [name]  Procedure(s) Performed: CYSTOSCOPY WITH STENT REPLACEMENT retrograde pylegram (Right Ureter)  Patient Location: PACU  Anesthesia Type:General  Level of Consciousness: drowsy and patient cooperative  Airway & Oxygen Therapy: Patient Spontanous Breathing and Patient connected to face mask oxygen  Post-op Assessment: Report given to RN and Post -op Vital signs reviewed and stable  Post vital signs: Reviewed and stable  Last Vitals:  Vitals Value Taken Time  BP    Temp    Pulse 106 01/31/2018 12:23 AM  Resp 15 01/31/2018 12:23 AM  SpO2 100 % 01/31/2018 12:23 AM  Vitals shown include unvalidated device data.  Last Pain:  Vitals:   01/30/18 2310  TempSrc:   PainSc: 0-No pain         Complications: No apparent anesthesia complications

## 2018-01-31 NOTE — Anesthesia Procedure Notes (Signed)
Procedure Name: LMA Insertion Date/Time: 01/30/2018 11:58 PM Performed by: Montel Clock, CRNA Pre-anesthesia Checklist: Patient identified, Emergency Drugs available, Suction available, Patient being monitored and Timeout performed Patient Re-evaluated:Patient Re-evaluated prior to induction Oxygen Delivery Method: Circle system utilized Preoxygenation: Pre-oxygenation with 100% oxygen Induction Type: IV induction Ventilation: Mask ventilation without difficulty LMA: LMA inserted LMA Size: 3.0 Number of attempts: 1 Dental Injury: Teeth and Oropharynx as per pre-operative assessment

## 2018-01-31 NOTE — Anesthesia Postprocedure Evaluation (Signed)
Anesthesia Post Note  Patient: [name]  Procedure(s) Performed: CYSTOSCOPY WITH STENT REPLACEMENT retrograde pylegram (Right Ureter)     Patient location during evaluation: PACU Anesthesia Type: General Level of consciousness: awake and alert Pain management: pain level controlled Vital Signs Assessment: post-procedure vital signs reviewed and stable Respiratory status: spontaneous breathing, nonlabored ventilation, respiratory function stable and patient connected to nasal cannula oxygen Cardiovascular status: blood pressure returned to baseline and stable Postop Assessment: no apparent nausea or vomiting Anesthetic complications: no    Last Vitals:  Vitals:   01/30/18 2300 01/31/18 0023  BP: 119/77 120/78  Pulse: (!) 108 (!) 106  Resp: 19 15  Temp:  37 C  SpO2: 92% 100%    Last Pain:  Vitals:   01/31/18 0023  TempSrc:   PainSc: 0-No pain                 [name]

## 2018-01-31 NOTE — Progress Notes (Signed)
Pt. just arrived back from PACU after Cytoscopy with Stent placement, RN at bedside, pt. still slightly drowsy from procedure, placed on CPAP per order/home regimen, tolerating well, 2 lpm Oxygen placed in-line.

## 2018-02-01 DIAGNOSIS — R7881 Bacteremia: Secondary | ICD-10-CM

## 2018-02-01 LAB — CBC
HCT: 38.5 % (ref 36.0–46.0)
Hemoglobin: 11.8 g/dL — ABNORMAL LOW (ref 12.0–15.0)
MCH: 28.9 pg (ref 26.0–34.0)
MCHC: 30.6 g/dL (ref 30.0–36.0)
MCV: 94.4 fL (ref 78.0–100.0)
Platelets: 187 10*3/uL (ref 150–400)
RBC: 4.08 MIL/uL (ref 3.87–5.11)
RDW: 13.9 % (ref 11.5–15.5)
WBC: 12.7 10*3/uL — ABNORMAL HIGH (ref 4.0–10.5)

## 2018-02-01 NOTE — Care Management Note (Signed)
Case Management Note  Patient Details  Name: ORIT SANVILLE MRN: [id number] Date of Birth: [birth year]  Subjective/Objective: PT recc HHPT. Spoek to patient/dtr Learta Codding in rm about d/c plans-they agree on [facility] for [place] aware.Contact patien't son [name] for services set [phone].                   Action/Plan:dc home w/HHC.   Expected Discharge Date:  (unknown)               Expected Discharge Plan:  [place]  In-House Referral:     Discharge planning Services  CM Consult  Post Acute Care Choice:  Durable Medical Equipment(rw,3n1) Choice offered to:  Patient, Adult Children  DME Arranged:    DME Agency:     HH Arranged:  RN, PT, OT, Nurse's Aide, Social Work [employer] Agency:  [facility]  Status of Service:  In process, will continue to follow  If discussed at Long Length of Stay Meetings, dates discussed:    Additional Comments:  Dessa Phi, RN 02/01/2018, 1:37 PM

## 2018-02-01 NOTE — Progress Notes (Signed)
PROGRESS NOTE    [name]  VHQ:[id number] DOB: 11-02-1933 DOA: 01/30/2018 PCP: [name], MD   Brief Narrative:  [name] is a 84 y.o. female with history of chronic atrial fibrillation not on anticoagulation secondary to multiple falls, abdominal aortic aneurysm being followed by cardiologist, HTN, hyperlipidemia, OSA, dementia, chronic pain p/w worsening right flank pain, fever, chills and was admitted for sepsis due to acute pyelonephritis, with high fever, leukocytosis, Afib RVR. Started on IVF and iv zosyn empirically. Both urine and blood culture actually grew E coli. Antibiotics switched to iv Meropenem based on previous ESBL infection but still pending E coli identity this time. CT abdomen pelvis (stone protocol) revealed right proximal ureteral 6 mm obstructing calculus with associated right hydroureteronephrosis and perinephric/ureteral inflammation and edema. Urology consulted, performed emergent right ureteral stent in OR and plan for antibiotics for 2 weeks then schedule for removal of her stone after completion of antibiotics.   Assessment & Plan:   Principal Problem:   Sepsis due to gram-negative UTI [facility]) Active Problems:   Essential hypertension   OSA (obstructive sleep apnea)   Dementia (HCC)   [name] (abdominal aortic aneurysm) (HCC)   E coli bacteremia   Atrial fibrillation with RVR (HCC)   Urolithiasis   Hydronephrosis with renal and ureteral calculus obstruction   Acute pyelonephritis   Constipation   Plan: -continue iv meropenem which is based on previous culture identity and sensitivity; E coli positive in both blood and urine culture but sensitivity still pending -fever resolved and wbc improving -s/p right ureteral stent, once pt completes 2 weeks of meropenem, urologist will schedule for stone removal as outpatient; continue 2 more days of gentle IV NS -advance to low salt diet -resume atenolol and other home meds; Afib is now rate  controlled -continue stool softener and laxatives for constipation -PT/OT eval; OOB to chair tid with meals; need HHC PT for discharge -CPAP qHS  DVT prophylaxis: heparin [place] Code Status: full code Family Communication: no family in room Disposition Plan:  Pending PT/OT eval   Consultants:   urology  Procedures: ureteral stent placement   Antimicrobials: meropenem    Subjective: No fever, chills, flank pain, nausea or vomiting; does have some back pain; tolerating diet; fever resolved and wbc improving. Cultures positive for E coli, still pending sensitivity.   Objective: Vitals:   01/31/18 2049 02/01/18 0446 02/01/18 0915 02/01/18 1400  BP:  100/72 110/65 103/69  Pulse:  95 74 98  Resp: 18 18    Temp:  97.8 F (36.6 C)  99 F (37.2 C)  TempSrc: Oral Oral  Oral  SpO2: 94% 98%  94%  Weight:      Height:        Intake/Output Summary (Last 24 hours) at 02/01/2018 1506 Last data filed at 02/01/2018 1414 Gross per 24 hour  Intake 1276.78 ml  Output 1500 ml  Net -223.22 ml   Filed Weights   01/30/18 2037 01/31/18 0120  Weight: 77.1 kg 81 kg    Examination:  General exam: Appears calm and comfortable; dry mouth Respiratory system: Clear to auscultation. Respiratory effort normal. Cardiovascular system: irregular rhythm, mild tachycardia. No JVD, murmurs, rubs, gallops or clicks. No pedal edema. Gastrointestinal system: Abdomen is nondistended, soft and nontender. No organomegaly or masses felt. Normal bowel sounds heard. Central nervous system: Alert and oriented. No focal neurological deficits. Extremities: Symmetric 5 x 5 power. Skin: No rashes, lesions or ulcers Psychiatry: Judgement and insight appear normal.  Mood & affect appropriate.     Data Reviewed: I have personally reviewed following labs and imaging studies  CBC: Recent Labs  Lab 01/30/18 2052 01/31/18 0124 02/01/18 0525  WBC 13.4* 14.0* 12.7*  NEUTROABS 11.0* 11.9*  --   HGB 13.7 11.6*  11.8*  HCT 42.8 36.8 38.5  MCV 93.2 92.5 94.4  PLT 185 178 828   Basic Metabolic Panel: Recent Labs  Lab 01/30/18 2052 01/31/18 0124  NA 140 140  K 4.6 4.1  CL 99 103  CO2 29 26  GLUCOSE 114* 132*  BUN 18 18  CREATININE 1.03* 1.06*  CALCIUM 9.2 8.6*   GFR: Estimated Creatinine Clearance: 42.1 mL/min (A) (by C-G formula based on SCr of 1.06 mg/dL (H)). Liver Function Tests: Recent Labs  Lab 01/30/18 2052 01/31/18 0124  AST 22 19  ALT 13 13  ALKPHOS 68 59  BILITOT 1.0 0.9  PROT 7.5 6.5  ALBUMIN 3.6 3.1*   Recent Labs  Lab 01/30/18 2052  LIPASE 23   No results for input(s): AMMONIA in the last 168 hours. Coagulation Profile: Recent Labs  Lab 01/31/18 0124  INR 1.22   Cardiac Enzymes: No results for input(s): CKTOTAL, CKMB, CKMBINDEX, TROPONINI in the last 168 hours. BNP (last 3 results) No results for input(s): PROBNP in the last 8760 hours. HbA1C: No results for input(s): HGBA1C in the last 72 hours. CBG: Recent Labs  Lab 01/31/18 0053  GLUCAP 122*   Lipid Profile: No results for input(s): CHOL, HDL, LDLCALC, TRIG, CHOLHDL, LDLDIRECT in the last 72 hours. Thyroid Function Tests: No results for input(s): TSH, T4TOTAL, FREET4, T3FREE, THYROIDAB in the last 72 hours. Anemia Panel: No results for input(s): VITAMINB12, FOLATE, FERRITIN, TIBC, IRON, RETICCTPCT in the last 72 hours. Sepsis Labs: Recent Labs  Lab 01/30/18 2118 01/31/18 0124  PROCALCITON  --  1.54  LATICACIDVEN 1.86 1.6    Recent Results (from the past 240 hour(s))  Blood Culture (routine x 2)     Status: Abnormal (Preliminary result)   Collection Time: 01/30/18  8:57 PM  Result Value Ref Range Status   Specimen Description   Final    BLOOD RIGHT WRIST Performed at [facility], [address] [place], [place] [address]    Special Requests   Final    BOTTLES DRAWN AEROBIC AND ANAEROBIC Blood Culture adequate volume Performed at [facility]  [address] [place], [place] [address]    Culture  Setup Time   Final    GRAM NEGATIVE RODS IN BOTH AEROBIC AND ANAEROBIC BOTTLES CRITICAL RESULT CALLED TO, READ BACK BY AND VERIFIED WITH: Karie Kirks PharmD 13:05 01/31/18 ([email or web address]) Performed at [facility], [address], [place], [place] [address]    Culture ESCHERICHIA COLI SUSCEPTIBILITIES TO FOLLOW (A)  Final   Report Status PENDING  Incomplete  Blood Culture ID Panel (Reflexed)     Status: Abnormal   Collection Time: 01/30/18  8:57 PM  Result Value Ref Range Status   Enterococcus species NOT DETECTED NOT DETECTED Final   Listeria monocytogenes NOT DETECTED NOT DETECTED Final   Staphylococcus species NOT DETECTED NOT DETECTED Final   Staphylococcus aureus (BCID) NOT DETECTED NOT DETECTED Final   Streptococcus species NOT DETECTED NOT DETECTED Final   Streptococcus agalactiae NOT DETECTED NOT DETECTED Final   Streptococcus pneumoniae NOT DETECTED NOT DETECTED Final   Streptococcus pyogenes NOT DETECTED NOT DETECTED Final   Acinetobacter baumannii NOT DETECTED NOT DETECTED Final  Enterobacteriaceae species DETECTED (A) NOT DETECTED Final    Comment: Enterobacteriaceae represent a large family of gram-negative bacteria, not a single organism. CRITICAL RESULT CALLED TO, READ BACK BY AND VERIFIED WITH: Karie Kirks PharmD 13:05 01/31/18 ([email or web address])    Enterobacter cloacae complex NOT DETECTED NOT DETECTED Final   Escherichia coli DETECTED (A) NOT DETECTED Final    Comment: CRITICAL RESULT CALLED TO, READ BACK BY AND VERIFIED WITH: Karie Kirks PharmD 13:05 01/31/18 ([email or web address])    Klebsiella oxytoca NOT DETECTED NOT DETECTED Final   Klebsiella pneumoniae NOT DETECTED NOT DETECTED Final   Proteus species NOT DETECTED NOT DETECTED Final   Serratia marcescens NOT DETECTED NOT DETECTED Final   Carbapenem resistance NOT DETECTED NOT DETECTED Final   Haemophilus influenzae NOT DETECTED NOT DETECTED Final   Neisseria meningitidis NOT DETECTED NOT  DETECTED Final   Pseudomonas aeruginosa NOT DETECTED NOT DETECTED Final   Candida albicans NOT DETECTED NOT DETECTED Final   Candida glabrata NOT DETECTED NOT DETECTED Final   Candida krusei NOT DETECTED NOT DETECTED Final   Candida parapsilosis NOT DETECTED NOT DETECTED Final   Candida tropicalis NOT DETECTED NOT DETECTED Final    Comment: Performed at [facility], [place] [address], [place], [place] [address]  Blood Culture (routine x 2)     Status: None (Preliminary result)   Collection Time: 01/30/18  9:12 PM  Result Value Ref Range Status   Specimen Description   Final    BLOOD RIGHT ANTECUBITAL Performed at [facility] [address] [place], [place] [address]    Special Requests   Final    BOTTLES DRAWN AEROBIC AND ANAEROBIC Blood Culture adequate volume Performed at [facility] [address] [place], [place] [address]    Culture  Setup Time   Final    GRAM NEGATIVE RODS IN BOTH AEROBIC AND ANAEROBIC BOTTLES CRITICAL VALUE NOTED.  VALUE IS CONSISTENT WITH PREVIOUSLY REPORTED AND CALLED VALUE. Performed at [facility], [place] [address] [place], [place] [address]    Culture GRAM NEGATIVE RODS  Final   Report Status PENDING  Incomplete  Urine Culture     Status: Abnormal (Preliminary result)   Collection Time: 01/31/18 12:07 AM  Result Value Ref Range Status   Specimen Description   Final    CYSTOSCOPY URINE Performed at [facility] [address], [place], [place] [address]    Special Requests   Final    NONE Performed at [facility], [place] [address] [place], [place] [address]    Culture >=100,000 COLONIES/mL ESCHERICHIA COLI (A)  Final   Report Status PENDING  Incomplete  Urine Culture     Status: Abnormal (Preliminary result)   Collection Time: 01/31/18  1:37 AM  Result Value Ref Range Status   Specimen Description   Final    URINE, CATHETERIZED Performed at [facility] [address], [place], [place] [address]    Special Requests   Final    NONE Performed at [facility], [place] [address], [place], [place] [address]    Culture 80,000 COLONIES/mL ESCHERICHIA COLI (A)  Final   Report Status PENDING  Incomplete         Radiology Studies: Dg C-arm 1-60 Min-no Report  Result Date: 01/31/2018 Fluoroscopy was utilized by the requesting physician.  No radiographic interpretation.   Ct Renal Stone Study  Result Date: 01/30/2018 CLINICAL DATA:  Acute abdominal flank pain worse  on the right EXAM: CT ABDOMEN AND PELVIS WITHOUT CONTRAST TECHNIQUE: Multidetector CT imaging of the abdomen and pelvis was performed following the standard protocol without IV contrast. COMPARISON:  09/04/2017 FINDINGS: Lower chest: Bibasilar atelectasis. Mild cardiomegaly. No pericardial or pleural effusion. Small hiatal hernia. Descending thoracic aorta is tortuous and ectatic with atherosclerosis. Hepatobiliary: No focal liver abnormality is seen. Status post cholecystectomy. No biliary dilatation. Pancreas: Unremarkable. No pancreatic ductal dilatation or surrounding inflammatory changes. Spleen: Normal in size without focal abnormality. Adrenals/Urinary Tract: Normal adrenal glands. Right kidney demonstrates acute perinephric strandy edema/inflammation. There is associated moderate right hydroureteronephrosis secondary to a proximal obstructing right ureteral calculus measuring 6 mm, image 34 series 2. There is a stable 2.5 cm right kidney lower pole hypodense cyst medially. Left kidney and ureter demonstrate no acute process or obstruction. Left ureter is nondilated. Air noted within the bladder dome.  Bladder otherwise unremarkable. Stomach/Bowel: Negative for bowel obstruction, significant dilatation, ileus, or free air. Appendix not visualized. No fluid collection or abscess. Moderate colonic stool burden. Extensive sigmoid diverticulosis without  acute inflammatory process. Vascular/Lymphatic: Atherosclerosis of the aorta. Infrarenal aorta has aneurysm dilatation measuring 3.9 cm. Extensive iliac atherosclerosis as well. No retroperitoneal hemorrhage or hematoma. No adenopathy. Reproductive: Remote hysterectomy. No other pelvic abnormality, fluid collection, ascites, or abscess. Other: No abdominal wall hernia or abnormality. No abdominopelvic ascites. Musculoskeletal: Bones are osteopenic. Degenerative changes of the spine. Extensive facet arthropathy. T12 and L5 subacute partially healing compression fractures noted when compared to 07/03/2017. IMPRESSION: Right proximal ureteral 6 mm obstructing calculus with associated right hydroureteronephrosis and perinephric/ureteral inflammation and edema. 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 Remote cholecystectomy, appendectomy and hysterectomy Colonic diverticulosis without acute inflammation or complicating feature Subacute T12 and L5 compression fractures, new since 07/03/2017 Electronically Signed   By: Jerilynn Mages.  Shick M.D.   On: 01/30/2018 22:00        Scheduled Meds: . aspirin  81 mg Oral Daily  . atenolol  50 mg Oral Daily  . DULoxetine  90 mg Oral q morning - 10a  . gabapentin  800 mg Oral TID  . heparin injection (subcutaneous)  5,000 Units Subcutaneous Q8H  . magnesium hydroxide  15 mL Oral Daily  . morphine  15 mg Oral TID  . rosuvastatin  40 mg Oral Daily  . senna-docusate  2 tablet Oral BID   Continuous Infusions: . sodium chloride 75 mL/hr at 02/01/18 1316  . meropenem (MERREM) IV 1 g (02/01/18 1432)     LOS: 2 days    Time spent: 35 min    Paticia Stack, MD [facility] Pager [phone]  If 7PM-7AM, please contact night-coverage [email or web address] Password [email or web address] 02/01/2018, 3:06 PM

## 2018-02-01 NOTE — Evaluation (Signed)
Occupational Therapy Evaluation Patient Details Name: [name] MRN: [id number] DOB: December 13, 1933 Today's Date: 02/01/2018    History of Present Illness 84 yo femal admitted with sepsis/ UTI, S/P right ureteral stent placement for infected stone. Patient has  history of compression fracture T 12, afib.   Clinical Impression   Pt was admitted for the above. At baseline, she completes dressing with gown and slippers and has assistance for bathing.  2 sons are available to assist her, and she walks to bathroom with RW. She will benefit from continued OT to increase safety and independence with ADLs. Goals are for min guard level for activities she normally performs at mod I level    Follow Up Recommendations  Home health OT;Supervision/Assistance - 24 hour(HH aide)    Equipment Recommendations  None recommended by OT    Recommendations for Other Services       Precautions / Restrictions Precautions Precautions: Back Precaution Comments: patient has back brace , stopped using a few weeks ago. Restrictions Weight Bearing Restrictions: No      Mobility Bed Mobility   Bed Mobility: Rolling Rolling: Mod assist         General bed mobility comments: assist with trunk to sitting; sidelying to sit  Transfers Overall transfer level: Needs assistance Equipment used: Rolling walker (2 wheeled) Transfers: Sit to/from Stand Sit to Stand: Min assist         General transfer comment: steady assist to rise from bed. cues  to reach to recliner    Balance                                           ADL either performed or assessed with clinical judgement   ADL Overall ADL's : Needs assistance/impaired Eating/Feeding: Set up   Grooming: Moderate assistance   Upper Body Bathing: Minimal assistance   Lower Body Bathing: Maximal assistance   Upper Body Dressing : Minimal assistance   Lower Body Dressing: Maximal assistance   Toilet Transfer: Minimal  assistance;Ambulation(chair)   Toileting- Clothing Manipulation and Hygiene: Minimal assistance;Sit to/from stand         General ADL Comments: pt wears only gowns and slip on slippers at home. Able to get these on herself     Vision         Perception     Praxis      Pertinent Vitals/Pain Pain Assessment: Faces Faces Pain Scale: Hurts little more Pain Location: back Pain Descriptors / Indicators: Discomfort Pain Intervention(s): Limited activity within patient's tolerance;Monitored during session     Hand Dominance     Extremity/Trunk Assessment Upper Extremity Assessment Upper Extremity Assessment: Generalized weakness(has arthritis; interferes with function. FF 90 Bil)           Communication Communication Communication: No difficulties   Cognition Arousal/Alertness: Awake/alert Behavior During Therapy: WFL for tasks assessed/performed Overall Cognitive Status: Within Functional Limits for tasks assessed                                     General Comments       Exercises     Shoulder Instructions      Home Living Family/patient expects to be discharged to:: Private residence Living Arrangements: Children  Bathroom Shower/Tub: Aeronautical engineer: Environmental consultant - 4 wheels;Bedside commode;Hospital bed;Wheelchair - manual   Additional Comments: lives with 2 sons      Prior Functioning/Environment Level of Independence: Needs assistance  Gait / Transfers Assistance Needed: uses rollator, has assist for all mobility, requires  lots of assist on steps.     Comments: help to step over tub; wears gown which she can put on herself        OT Problem List: Decreased strength;Decreased activity tolerance;Impaired balance (sitting and/or standing);Decreased knowledge of use of DME or AE;Pain      OT Treatment/Interventions: Self-care/ADL training;Therapeutic exercise;DME and/or AE  instruction;Therapeutic activities;Patient/family education;Balance training    OT Goals(Current goals can be found in the care plan section) Acute Rehab OT Goals Patient Stated Goal: to go home, get help for bathing OT Goal Formulation: With patient/family Time For Goal Achievement: 02/15/18 Potential to Achieve Goals: Good  OT Frequency: Min 2X/week   Barriers to D/C:            Co-evaluation PT/OT/SLP Co-Evaluation/Treatment: Yes Reason for Co-Treatment: For patient/therapist safety PT goals addressed during session: Mobility/safety with mobility OT goals addressed during session: ADL's and self-care      AM-PAC PT "6 Clicks" Daily Activity     Outcome Measure Help from another person eating meals?: A Little Help from another person taking care of personal grooming?: A Lot Help from another person toileting, which includes using toliet, bedpan, or urinal?: A Little Help from another person bathing (including washing, rinsing, drying)?: A Lot Help from another person to put on and taking off regular upper body clothing?: A Little Help from another person to put on and taking off regular lower body clothing?: A Lot 6 Click Score: 15   End of Session    Activity Tolerance: Patient tolerated treatment well Patient left: in chair;with call bell/phone within reach;with chair alarm set;with family/visitor present  OT Visit Diagnosis: Muscle weakness (generalized) (M62.81)                Time: 6659-9357 OT Time Calculation (min): 20 min Charges:  OT General Charges $OT Visit: 1 Visit OT Evaluation $OT Eval Low Complexity: 1 Low  Lesle Chris, OTR/L Acute Rehabilitation Services [phone] [facility] pager [phone] office 02/01/2018  [place] 02/01/2018, 4:17 PM

## 2018-02-01 NOTE — Evaluation (Signed)
Physical Therapy Evaluation Patient Details Name: [name] MRN: [id number] DOB: [birth year] Today's Date: 02/01/2018   History of Present Illness  84 yo femal admitted with sepsis/ UTI, S/P right ureteral stent placement for infected stone. Patient has  history of compression fracture T 12, afib.  Clinical Impression  The patient ambulated x 26' with 1 assist. Per patient and daughter, plans are to return home. Daughter requesting case manager provide info for ADL  caregiver assistance. Pt admitted with above diagnosis. Pt currently with functional limitations due to the deficits listed below (see PT Problem List).  Pt will benefit from skilled PT to increase their independence and safety with mobility to allow discharge to the venue listed below.       Follow Up Recommendations Home health PT;Supervision/Assistance - 24 hour    Equipment Recommendations  None recommended by PT    Recommendations for Other Services       Precautions / Restrictions Precautions Precautions: Back Precaution Comments: patient has back brace , stopped using a few weeks ago. Required Braces or Orthoses: Spinal Brace      Mobility  Bed Mobility Overal bed mobility: Needs Assistance Bed Mobility: Rolling Rolling: Mod assist         General bed mobility comments: assist with trunk to sitting  Transfers Overall transfer level: Needs assistance Equipment used: Rolling walker (2 wheeled) Transfers: Sit to/from Stand Sit to Stand: Mod assist         General transfer comment: steady assist to rise from bed. cues  to reach to recliner  Ambulation/Gait Ambulation/Gait assistance: Min assist Gait Distance (Feet): 60 Feet Assistive device: Rolling walker (2 wheeled) Gait Pattern/deviations: Step-to pattern;Step-through pattern Gait velocity: decr   General Gait Details: gait is slow but steady. Assist to guide the RW. patient is used to a [employer] (Stroke Patients Only)       Balance                                             Pertinent Vitals/Pain Pain Assessment: Faces Faces Pain Scale: Hurts little more Pain Location: back Pain Descriptors / Indicators: Discomfort Pain Intervention(s): Monitored during session    Home Living Family/patient expects to be discharged to:: Private residence Living Arrangements: Children Available Help at Discharge: Family;Available 24 hours/day Type of Home: House Home Access: Stairs to enter   [employer] of Steps: 2 Home Layout: Able to live on main level with bedroom/bathroom Home Equipment: Walker - 4 wheels;Bedside commode;Hospital bed;Wheelchair - manual Additional Comments: lives with 2 sons    Prior Function Level of Independence: Needs assistance   Gait / Transfers Assistance Needed: uses rollator, has assist for all mobility, requires  lots of assist on steps.     Comments: help to step over tub; wears gown which she can put on herself     Hand Dominance        Extremity/Trunk Assessment        Lower Extremity Assessment Lower Extremity Assessment: Generalized weakness    Cervical / Trunk Assessment Cervical / Trunk Assessment: Normal  Communication   Communication: No difficulties  Cognition Arousal/Alertness: Awake/alert Behavior During Therapy: WFL for tasks assessed/performed Overall Cognitive Status: Within Functional Limits for tasks assessed  General Comments      Exercises     Assessment/Plan    PT Assessment Patient needs continued PT services  PT Problem List Decreased strength;Decreased activity tolerance;Decreased mobility;Decreased balance;Decreased knowledge of precautions;Decreased safety awareness;Decreased knowledge of use of DME       PT Treatment Interventions DME instruction;Therapeutic exercise;Gait  training;Functional mobility training;Therapeutic activities;Patient/family education    PT Goals (Current goals can be found in the Care Plan section)  Acute Rehab PT Goals Patient Stated Goal: to go home, get help for bathing PT Goal Formulation: With patient/family Time For Goal Achievement: 02/15/18 Potential to Achieve Goals: Good    Frequency Min 3X/week   Barriers to discharge        Co-evaluation PT/OT/SLP Co-Evaluation/Treatment: Yes Reason for Co-Treatment: For patient/therapist safety PT goals addressed during session: Mobility/safety with mobility OT goals addressed during session: ADL's and self-care       AM-PAC PT "6 Clicks" Daily Activity  Outcome Measure Difficulty turning over in bed (including adjusting bedclothes, sheets and blankets)?: Unable Difficulty moving from lying on back to sitting on the side of the bed? : Unable Difficulty sitting down on and standing up from a chair with arms (e.g., wheelchair, bedside commode, etc,.)?: Unable Help needed moving to and from a bed to chair (including a wheelchair)?: A Lot Help needed walking in hospital room?: A Lot Help needed climbing 3-5 steps with a railing? : Total 6 Click Score: 8    End of Session Equipment Utilized During Treatment: Gait belt Activity Tolerance: Patient tolerated treatment well Patient left: in chair;with call bell/phone within reach;with chair alarm set Nurse Communication: Mobility status PT Visit Diagnosis: Unsteadiness on feet (R26.81)    Time: 7026-3785 PT Time Calculation (min) (ACUTE ONLY): 22 min   Charges:   PT Evaluation $PT Eval Low Complexity: [place] Pager [phone] Office [phone]   [name] 02/01/2018, 1:06 PM

## 2018-02-02 ENCOUNTER — Inpatient Hospital Stay (HOSPITAL_COMMUNITY): Payer: Medicare Other

## 2018-02-02 ENCOUNTER — Inpatient Hospital Stay: Payer: Self-pay

## 2018-02-02 DIAGNOSIS — A499 Bacterial infection, unspecified: Secondary | ICD-10-CM | POA: Diagnosis present

## 2018-02-02 DIAGNOSIS — Z1612 Extended spectrum beta lactamase (ESBL) resistance: Secondary | ICD-10-CM | POA: Diagnosis present

## 2018-02-02 DIAGNOSIS — N390 Urinary tract infection, site not specified: Secondary | ICD-10-CM | POA: Diagnosis present

## 2018-02-02 DIAGNOSIS — I482 Chronic atrial fibrillation, unspecified: Secondary | ICD-10-CM | POA: Diagnosis present

## 2018-02-02 DIAGNOSIS — B9629 Other Escherichia coli [E. coli] as the cause of diseases classified elsewhere: Secondary | ICD-10-CM

## 2018-02-02 LAB — BASIC METABOLIC PANEL
Anion gap: 9 (ref 5–15)
BUN: 19 mg/dL (ref 8–23)
CO2: 27 mmol/L (ref 22–32)
Calcium: 8.6 mg/dL — ABNORMAL LOW (ref 8.9–10.3)
Chloride: 104 mmol/L (ref 98–111)
Creatinine, Ser: 0.98 mg/dL (ref 0.44–1.00)
GFR calc Af Amer: >60 mL/min (ref >60)
GFR calc non Af Amer: 52 mL/min — ABNORMAL LOW (ref >60)
Glucose, Bld: 97 mg/dL (ref 70–99)
Potassium: 4.2 mmol/L (ref 3.5–5.1)
Sodium: 140 mmol/L (ref 135–145)

## 2018-02-02 LAB — TROPONIN I: Troponin I: <0.03 ng/mL (ref <0.03)

## 2018-02-02 LAB — CBC
HCT: 35 % — ABNORMAL LOW (ref 36.0–46.0)
Hemoglobin: 10.7 g/dL — ABNORMAL LOW (ref 12.0–15.0)
MCH: 29.1 pg (ref 26.0–34.0)
MCHC: 30.6 g/dL (ref 30.0–36.0)
MCV: 95.1 fL (ref 78.0–100.0)
Platelets: 180 10*3/uL (ref 150–400)
RBC: 3.68 MIL/uL — ABNORMAL LOW (ref 3.87–5.11)
RDW: 14 % (ref 11.5–15.5)
WBC: 7.6 10*3/uL (ref 4.0–10.5)

## 2018-02-02 LAB — URINE CULTURE
Culture: 80000 — AB
Culture: >=100000 — AB

## 2018-02-02 LAB — CULTURE, BLOOD (ROUTINE X 2)
Special Requests: ADEQUATE
Special Requests: ADEQUATE

## 2018-02-02 MED ORDER — SODIUM CHLORIDE 0.9% FLUSH: 10.0000 mL | INTRAVENOUS | Status: DC | PRN

## 2018-02-02 MED ORDER — NITROGLYCERIN 0.4 MG SL SUBL
0.4000 mg | SUBLINGUAL_TABLET | SUBLINGUAL | Status: DC | PRN
  Administered 2018-02-02: 0.4 mg via SUBLINGUAL
  Filled 2018-02-02: qty 1

## 2018-02-02 MED ORDER — PANTOPRAZOLE SODIUM 40 MG PO TBEC
40.0000 mg | DELAYED_RELEASE_TABLET | Freq: Every day | ORAL | Status: DC
  Administered 2018-02-02 – 2018-02-03 (×2): 40 mg via ORAL
  Filled 2018-02-02 (×2): qty 1

## 2018-02-02 NOTE — Progress Notes (Signed)
PHARMACY CONSULT NOTE FOR:  OUTPATIENT  PARENTERAL ANTIBIOTIC THERAPY (OPAT)  Indication: Bacteremia Regimen: Invanz 1 gm IV q24 End date: 02/14/18  IV antibiotic discharge orders are pended. To discharging provider:  please sign these orders via discharge navigator,  Select New Orders & click on the button choice - Manage This Unsigned Work.     Thank you for allowing pharmacy to be a part of this patient's care.  Eudelia Bunch, Pharm.D [phone] 02/02/2018 9:54 AM

## 2018-02-02 NOTE — Progress Notes (Signed)
OT Cancellation Note  Patient Details Name: RENETTE HSU MRN: [id number] DOB: 21-Jun-1933   Cancelled Treatment:    Reason Eval/Treat Not Completed: Medical issues which prohibited therapy.  Pt with chest pain. Will check another day  Lahoma Constantin 02/02/2018, 2:16 PM  Lesle Chris, OTR/L Acute Rehabilitation Services [phone] [facility] pager [phone] office 02/02/2018

## 2018-02-02 NOTE — Progress Notes (Signed)
[facility]  [facility] will provide HHRN/home care services and Home infusion pharmacy services for home IV ABX. [facility] is prepared to support DC home today for admission visit tomorrow on 02-03-18.  If patient discharges after hours, please call [phone].   Larry Sierras 02/02/2018, 10:57 AM

## 2018-02-02 NOTE — Care Management Note (Signed)
Case Management Note  Patient Details  Name: WANETTE ROBISON MRN: [id number] Date of Birth: Jan 22, 1934  Subjective/Objective: d/c plan home w/HHC/iv abx-invanz till 10/16-[facility] chosen-rep [place]-iv abx,& [place] aware of services, & d/c in am. For picc today.Spoke to primary contact-Son Shan OACZY [phone]-XN agreement to instruction for iv abx, & HHC-[facility]. Patietn already has home 02-[facility],& travel tank.Family will transport home on own.                   Action/Plan:dc home w/HHC/iv abx.   Expected Discharge Date:  (unknown)               Expected Discharge Plan:  [place]  In-House Referral:     Discharge planning Services  CM Consult  Post Acute Care Choice:  Durable Medical Equipment(rw,3n1) Choice offered to:  Patient, Adult Children  DME Arranged:    DME Agency:     HH Arranged:  RN, PT, OT, Nurse's Aide, Social Work, IV Antibiotics HH Agency:  [place]  Status of Service:  Completed, signed off  If discussed at [employer], dates discussed:    Additional Comments:  Dessa Phi, RN 02/02/2018, 10:55 AM

## 2018-02-02 NOTE — Progress Notes (Signed)
Peripherally Inserted Central Catheter/Midline Placement  The IV Nurse has discussed with the patient and/or persons authorized to consent for the patient, the purpose of this procedure and the potential benefits and risks involved with this procedure.  The benefits include less needle sticks, lab draws from the catheter, and the patient may be discharged home with the catheter. Risks include, but not limited to, infection, bleeding, blood clot (thrombus formation), and puncture of an artery; nerve damage and irregular heartbeat and possibility to perform a PICC exchange if needed/ordered by physician.  Alternatives to this procedure were also discussed.  Bard Power PICC patient education guide, fact sheet on infection prevention and patient information card has been provided to patient /or left at bedside.  Consent obtained via telephone from son, [name] due to altered mental status.  PICC/Midline Placement Documentation  PICC Single Lumen 02/02/18 PICC Right Brachial 38 cm 0 cm (Active)  Indication for Insertion or Continuance of Line Home intravenous therapies (PICC only) 02/02/2018  9:04 PM  Exposed Catheter (cm) 0 cm 02/02/2018  9:04 PM  Site Assessment Clean;Dry;Intact 02/02/2018  9:04 PM  Line Status Flushed;Saline locked;Blood return noted 02/02/2018  9:04 PM  Dressing Type Transparent 02/02/2018  9:04 PM  Dressing Status Clean;Dry;Intact 02/02/2018  9:04 PM  Dressing Intervention New dressing 02/02/2018  9:04 PM  Dressing Change Due 02/09/18 02/02/2018  9:04 PM       Talyssa Gibas, Nicolette Bang 02/02/2018, 9:05 PM

## 2018-02-02 NOTE — Progress Notes (Signed)
Patient c/o chest pain 3/10 pressure/burning. "I don't know how to explain it" Vitals obtained 120/70 HR 81. EKG obtained. Patient stated "it feels like my reflux". Dr. Crisoforo Oxford notified and placed orders.

## 2018-02-02 NOTE — Care Management Important Message (Signed)
Important Message  Patient Details  Name: [name] MRN: [id number] Date of Birth: [birth year]   Medicare Important Message Given:  Yes    Kerin Salen 02/02/2018, 11:12 AMImportant Message  Patient Details  Name: [name] MRN: [id number] Date of Birth: [birth year]   Medicare Important Message Given:  Yes    Kerin Salen 02/02/2018, 11:12 AM

## 2018-02-02 NOTE — Progress Notes (Signed)
PROGRESS NOTE    [name]  IZT:[id number] DOB: March 20, 1934 DOA: 01/30/2018 PCP: [name], MD   Brief Narrative:  [name] is a 84 y.o. female with history of chronic atrial fibrillation not on anticoagulation secondary to multiple falls, abdominal aortic aneurysm being followed by cardiologist, HTN, hyperlipidemia, OSA, dementia, chronic pain p/w worsening right flank pain, fever, chills and was admitted for sepsis due to acute pyelonephritis, with high fever, leukocytosis, Afib RVR. Started on IVF and iv zosyn empirically. Both urine and blood culture actually grew ESBL E coli. Antibiotics switched to iv Meropenem. CT abdomen pelvis (stone protocol) revealed right proximal ureteral 6 mm obstructing calculus with associated right hydroureteronephrosis and perinephric/ureteral inflammation and edema. Urology consulted, performed emergent right ureteral stent in OR and plan for antibiotics for 2 weeks then schedule for removal of her stone after completion of antibiotics. PICC line placement today and pt likely can go home in am with HHC RN/PT/OT/SW (face to face [place] ordered). Once discharging, she can be switched to once daily ertapenem home infusion  Assessment & Plan:   Principal Problem:   Urinary tract infection due to extended-spectrum beta lactamase (ESBL) producing Escherichia coli Active Problems:   Essential hypertension   OSA (obstructive sleep apnea)   Dementia (HCC)   E coli bacteremia   Urolithiasis   Hydronephrosis with renal and ureteral calculus obstruction   Acute pyelonephritis   Constipation   Chronic a-fib   Plan: -cultures confirmed to be ESBL E coli and she's on appropriate meropenem treatment -fever resolved and wbc normalized. Sepsis resolving -s/p right ureteral stent, once pt completes 2 weeks of meropenem, urologist will schedule for stone removal as outpatient;  -PICC line today. Once discharging, she can be switched to once daily ertapenem  home infusion -advance to low salt diet -resume atenolol and other home meds; Afib is now rate controlled -continue stool softener and laxatives for constipation -PT/OT eval; OOB to chair tid with meals; HHC PT/OT/RN/aid/Sw ordered -CPAP qHS -PRN pain control for headache and backpain  DVT prophylaxis: heparin [place] Code Status: full code Family Communication: no family in room Disposition Plan:  Pending PT/OT eval   Consultants:   urology  Procedures: ureteral stent placement   Antimicrobials: meropenem    Subjective: No fever, chills, flank pain, nausea or vomiting; c/o headache and back pain; tolerating diet; cultures confirmed to be ESBL  Objective: Vitals:   02/01/18 0915 02/01/18 1400 02/01/18 2108 02/02/18 0457  BP: 110/65 103/69 103/76 104/79  Pulse: 74 98 96 80  Resp:   18 18  Temp:  99 F (37.2 C) 99.4 F (37.4 C) 98 F (36.7 C)  TempSrc:  Oral Oral   SpO2:  94% 94% 96%  Weight:      Height:        Intake/Output Summary (Last 24 hours) at 02/02/2018 1037 Last data filed at 02/02/2018 0500 Gross per 24 hour  Intake 2033.06 ml  Output 1350 ml  Net 683.06 ml   Filed Weights   01/30/18 2037 01/31/18 0120  Weight: 77.1 kg 81 kg    Examination:  General exam: Appears calm and comfortable; dry mouth Respiratory system: Clear to auscultation. Respiratory effort normal. Cardiovascular system: irregular rhythm, mild tachycardia. No JVD, murmurs, rubs, gallops or clicks. No pedal edema. Gastrointestinal system: Abdomen is nondistended, soft and nontender. No organomegaly or masses felt. Normal bowel sounds heard. Central nervous system: Alert and oriented. No focal neurological deficits. Extremities: Symmetric 5 x 5 power.  Skin: No rashes, lesions or ulcers Psychiatry: Judgement and insight appear normal. Mood & affect appropriate.     Data Reviewed: I have personally reviewed following labs and imaging studies  CBC: Recent Labs  Lab 01/30/18 2052  01/31/18 0124 02/01/18 0525 02/02/18 0531  WBC 13.4* 14.0* 12.7* 7.6  NEUTROABS 11.0* 11.9*  --   --   HGB 13.7 11.6* 11.8* 10.7*  HCT 42.8 36.8 38.5 35.0*  MCV 93.2 92.5 94.4 95.1  PLT 185 178 187 409   Basic Metabolic Panel: Recent Labs  Lab 01/30/18 2052 01/31/18 0124 02/02/18 0531  NA 140 140 140  K 4.6 4.1 4.2  CL 99 103 104  CO2 29 26 27   GLUCOSE 114* 132* 97  BUN 18 18 19   CREATININE 1.03* 1.06* 0.98  CALCIUM 9.2 8.6* 8.6*   GFR: Estimated Creatinine Clearance: 45.6 mL/min (by C-G formula based on SCr of 0.98 mg/dL). Liver Function Tests: Recent Labs  Lab 01/30/18 2052 01/31/18 0124  AST 22 19  ALT 13 13  ALKPHOS 68 59  BILITOT 1.0 0.9  PROT 7.5 6.5  ALBUMIN 3.6 3.1*   Recent Labs  Lab 01/30/18 2052  LIPASE 23   No results for input(s): AMMONIA in the last 168 hours. Coagulation Profile: Recent Labs  Lab 01/31/18 0124  INR 1.22   Cardiac Enzymes: No results for input(s): CKTOTAL, CKMB, CKMBINDEX, TROPONINI in the last 168 hours. BNP (last 3 results) No results for input(s): PROBNP in the last 8760 hours. HbA1C: No results for input(s): HGBA1C in the last 72 hours. CBG: Recent Labs  Lab 01/31/18 0053  GLUCAP 122*   Lipid Profile: No results for input(s): CHOL, HDL, LDLCALC, TRIG, CHOLHDL, LDLDIRECT in the last 72 hours. Thyroid Function Tests: No results for input(s): TSH, T4TOTAL, FREET4, T3FREE, THYROIDAB in the last 72 hours. Anemia Panel: No results for input(s): VITAMINB12, FOLATE, FERRITIN, TIBC, IRON, RETICCTPCT in the last 72 hours. Sepsis Labs: Recent Labs  Lab 01/30/18 2118 01/31/18 0124  PROCALCITON  --  1.54  LATICACIDVEN 1.86 1.6    Recent Results (from the past 240 hour(s))  Blood Culture (routine x 2)     Status: Abnormal   Collection Time: 01/30/18  8:57 PM  Result Value Ref Range Status   Specimen Description   Final    BLOOD RIGHT WRIST Performed at [facility], [address] [place], [place]  [address]    Special Requests   Final    BOTTLES DRAWN AEROBIC AND ANAEROBIC Blood Culture adequate volume Performed at [facility] [address], [place], [place] [address]    Culture  Setup Time   Final    GRAM NEGATIVE RODS IN BOTH AEROBIC AND ANAEROBIC BOTTLES CRITICAL RESULT CALLED TO, READ BACK BY AND VERIFIED WITH: Karie Kirks PharmD 13:05 01/31/18 (wilsonm) Performed at [facility], [address] [place], [place] [address]    Culture (A)  Final    ESCHERICHIA COLI Confirmed Extended Spectrum Beta-Lactamase Producer (ESBL).  In bloodstream infections from ESBL organisms, carbapenems are preferred over piperacillin/tazobactam. They are shown to have a lower risk of mortality.    Report Status 02/02/2018 FINAL  Final   Organism ID, Bacteria ESCHERICHIA COLI  Final      Susceptibility   Escherichia coli - MIC*    AMPICILLIN >=32 RESISTANT Resistant     CEFAZOLIN >=64 RESISTANT Resistant     CEFEPIME >=64 RESISTANT Resistant     CEFTAZIDIME >=64 RESISTANT Resistant  CEFTRIAXONE >=64 RESISTANT Resistant     CIPROFLOXACIN >=4 RESISTANT Resistant     GENTAMICIN <=1 SENSITIVE Sensitive     IMIPENEM <=0.25 SENSITIVE Sensitive     TRIMETH/SULFA >=320 RESISTANT Resistant     AMPICILLIN/SULBACTAM >=32 RESISTANT Resistant     PIP/TAZO 16 SENSITIVE Sensitive     Extended ESBL POSITIVE Resistant     * ESCHERICHIA COLI  Blood Culture ID Panel (Reflexed)     Status: Abnormal   Collection Time: 01/30/18  8:57 PM  Result Value Ref Range Status   Enterococcus species NOT DETECTED NOT DETECTED Final   Listeria monocytogenes NOT DETECTED NOT DETECTED Final   Staphylococcus species NOT DETECTED NOT DETECTED Final   Staphylococcus aureus (BCID) NOT DETECTED NOT DETECTED Final   Streptococcus species NOT DETECTED NOT DETECTED Final   Streptococcus agalactiae NOT DETECTED NOT DETECTED Final   Streptococcus pneumoniae NOT DETECTED NOT DETECTED Final   Streptococcus  pyogenes NOT DETECTED NOT DETECTED Final   Acinetobacter baumannii NOT DETECTED NOT DETECTED Final   Enterobacteriaceae species DETECTED (A) NOT DETECTED Final    Comment: Enterobacteriaceae represent a large family of gram-negative bacteria, not a single organism. CRITICAL RESULT CALLED TO, READ BACK BY AND VERIFIED WITH: Karie Kirks PharmD 13:05 01/31/18 (wilsonm)    Enterobacter cloacae complex NOT DETECTED NOT DETECTED Final   Escherichia coli DETECTED (A) NOT DETECTED Final    Comment: CRITICAL RESULT CALLED TO, READ BACK BY AND VERIFIED WITH: Karie Kirks PharmD 13:05 01/31/18 (wilsonm)    Klebsiella oxytoca NOT DETECTED NOT DETECTED Final   Klebsiella pneumoniae NOT DETECTED NOT DETECTED Final   Proteus species NOT DETECTED NOT DETECTED Final   Serratia marcescens NOT DETECTED NOT DETECTED Final   Carbapenem resistance NOT DETECTED NOT DETECTED Final   Haemophilus influenzae NOT DETECTED NOT DETECTED Final   Neisseria meningitidis NOT DETECTED NOT DETECTED Final   Pseudomonas aeruginosa NOT DETECTED NOT DETECTED Final   Candida albicans NOT DETECTED NOT DETECTED Final   Candida glabrata NOT DETECTED NOT DETECTED Final   Candida krusei NOT DETECTED NOT DETECTED Final   Candida parapsilosis NOT DETECTED NOT DETECTED Final   Candida tropicalis NOT DETECTED NOT DETECTED Final    Comment: Performed at [facility], [place] [address], [place], [place] [address]  Blood Culture (routine x 2)     Status: Abnormal   Collection Time: 01/30/18  9:12 PM  Result Value Ref Range Status   Specimen Description   Final    BLOOD RIGHT ANTECUBITAL Performed at [facility] [address] [place], [place] [address]    Special Requests   Final    BOTTLES DRAWN AEROBIC AND ANAEROBIC Blood Culture adequate volume Performed at [facility] [address] [place], [place] [address]    Culture  Setup Time   Final    GRAM NEGATIVE RODS IN BOTH AEROBIC AND ANAEROBIC  BOTTLES CRITICAL VALUE NOTED.  VALUE IS CONSISTENT WITH PREVIOUSLY REPORTED AND CALLED VALUE.    Culture (A)  Final    ESCHERICHIA COLI SUSCEPTIBILITIES PERFORMED ON PREVIOUS CULTURE WITHIN THE LAST 5 DAYS. Performed at [facility], [place] [address], [place],  [address]    Report Status 02/02/2018 FINAL  Final  Urine Culture     Status: Abnormal   Collection Time: 01/31/18 12:07 AM  Result Value Ref Range Status   Specimen Description   Final    CYSTOSCOPY URINE Performed at [facility]  [address] [place], [place] [address]    Special Requests   Final    NONE Performed at [facility], [place] [address] [place], [place] [address]    Culture (A)  Final    >=100,000 COLONIES/mL ESCHERICHIA COLI Confirmed Extended Spectrum Beta-Lactamase Producer (ESBL).  In bloodstream infections from ESBL organisms, carbapenems are preferred over piperacillin/tazobactam. They are shown to have a lower risk of mortality.    Report Status 02/02/2018 FINAL  Final   Organism ID, Bacteria ESCHERICHIA COLI (A)  Final      Susceptibility   Escherichia coli - MIC*    AMPICILLIN >=32 RESISTANT Resistant     CEFAZOLIN >=64 RESISTANT Resistant     CEFEPIME >=64 RESISTANT Resistant     CEFTAZIDIME 16 INTERMEDIATE Intermediate     CEFTRIAXONE >=64 RESISTANT Resistant     CIPROFLOXACIN >=4 RESISTANT Resistant     GENTAMICIN <=1 SENSITIVE Sensitive     IMIPENEM <=0.25 SENSITIVE Sensitive     TRIMETH/SULFA >=320 RESISTANT Resistant     AMPICILLIN/SULBACTAM >=32 RESISTANT Resistant     PIP/TAZO <=4 SENSITIVE Sensitive     Extended ESBL POSITIVE Resistant     * >=100,000 COLONIES/mL ESCHERICHIA COLI  Urine Culture     Status: Abnormal   Collection Time: 01/31/18  1:37 AM  Result Value Ref Range Status   Specimen Description   Final    URINE, CATHETERIZED Performed at [facility] [address], [place], [place] [address]     Special Requests   Final    NONE Performed at [facility], [place] [address], [place], [place] [address]    Culture (A)  Final    80,000 COLONIES/mL ESCHERICHIA COLI Confirmed Extended Spectrum Beta-Lactamase Producer (ESBL).  In bloodstream infections from ESBL organisms, carbapenems are preferred over piperacillin/tazobactam. They are shown to have a lower risk of mortality.    Report Status 02/02/2018 FINAL  Final   Organism ID, Bacteria ESCHERICHIA COLI (A)  Final      Susceptibility   Escherichia coli - MIC*    AMPICILLIN >=32 RESISTANT Resistant     CEFAZOLIN >=64 RESISTANT Resistant     CEFTRIAXONE >=64 RESISTANT Resistant     CIPROFLOXACIN >=4 RESISTANT Resistant     GENTAMICIN <=1 SENSITIVE Sensitive     IMIPENEM <=0.25 SENSITIVE Sensitive     NITROFURANTOIN <=16 SENSITIVE Sensitive     TRIMETH/SULFA >=320 RESISTANT Resistant     AMPICILLIN/SULBACTAM >=32 RESISTANT Resistant     PIP/TAZO 16 SENSITIVE Sensitive     Extended ESBL POSITIVE Resistant     * 80,000 COLONIES/mL ESCHERICHIA COLI         Radiology Studies: No results found.      Scheduled Meds: . aspirin  81 mg Oral Daily  . atenolol  50 mg Oral Daily  . DULoxetine  90 mg Oral q morning - 10a  . gabapentin  800 mg Oral TID  . heparin injection (subcutaneous)  5,000 Units Subcutaneous Q8H  . magnesium hydroxide  15 mL Oral Daily  . morphine  15 mg Oral TID  . rosuvastatin  40 mg Oral Daily  . senna-docusate  2 tablet Oral BID   Continuous Infusions: . sodium chloride 75 mL/hr at 02/02/18 0309  . meropenem (MERREM) IV 1 g (02/02/18 0430)     LOS: 3 days    Time spent: 25 min    Paticia Stack, MD [facility] Pager [phone]  If 7PM-7AM, please contact night-coverage [email or web address]  Password [email or web address] 02/02/2018, 10:37 AM

## 2018-02-03 ENCOUNTER — Emergency Department (HOSPITAL_COMMUNITY): Payer: Medicare Other

## 2018-02-03 ENCOUNTER — Encounter (HOSPITAL_COMMUNITY): Payer: Self-pay | Admitting: Nurse Practitioner

## 2018-02-03 ENCOUNTER — Inpatient Hospital Stay (HOSPITAL_COMMUNITY)
Admission: EM | Admit: 2018-02-03 | Discharge: 2018-02-09 | DRG: 186 | Disposition: A | Discharge status: Skilled Nursing Facility | Payer: Medicare Other | Attending: Internal Medicine | Admitting: Internal Medicine

## 2018-02-03 ENCOUNTER — Observation Stay (HOSPITAL_COMMUNITY): Payer: Medicare Other

## 2018-02-03 DIAGNOSIS — G92 Toxic encephalopathy: Secondary | ICD-10-CM | POA: Diagnosis present

## 2018-02-03 DIAGNOSIS — J9601 Acute respiratory failure with hypoxia: Secondary | ICD-10-CM

## 2018-02-03 DIAGNOSIS — Z8249 Family history of ischemic heart disease and other diseases of the circulatory system: Secondary | ICD-10-CM

## 2018-02-03 DIAGNOSIS — M25562 Pain in left knee: Secondary | ICD-10-CM | POA: Diagnosis not present

## 2018-02-03 DIAGNOSIS — B888 Other specified infestations: Secondary | ICD-10-CM | POA: Diagnosis present

## 2018-02-03 DIAGNOSIS — M1712 Unilateral primary osteoarthritis, left knee: Secondary | ICD-10-CM | POA: Diagnosis not present

## 2018-02-03 DIAGNOSIS — M1990 Unspecified osteoarthritis, unspecified site: Secondary | ICD-10-CM | POA: Diagnosis present

## 2018-02-03 DIAGNOSIS — R0902 Hypoxemia: Secondary | ICD-10-CM | POA: Diagnosis not present

## 2018-02-03 DIAGNOSIS — F0390 Unspecified dementia without behavioral disturbance: Secondary | ICD-10-CM | POA: Diagnosis not present

## 2018-02-03 DIAGNOSIS — E119 Type 2 diabetes mellitus without complications: Secondary | ICD-10-CM | POA: Diagnosis present

## 2018-02-03 DIAGNOSIS — R0602 Shortness of breath: Secondary | ICD-10-CM

## 2018-02-03 DIAGNOSIS — I10 Essential (primary) hypertension: Secondary | ICD-10-CM | POA: Diagnosis present

## 2018-02-03 DIAGNOSIS — J9622 Acute and chronic respiratory failure with hypercapnia: Secondary | ICD-10-CM | POA: Diagnosis present

## 2018-02-03 DIAGNOSIS — B9629 Other Escherichia coli [E. coli] as the cause of diseases classified elsewhere: Secondary | ICD-10-CM | POA: Diagnosis present

## 2018-02-03 DIAGNOSIS — Z1612 Extended spectrum beta lactamase (ESBL) resistance: Secondary | ICD-10-CM | POA: Diagnosis present

## 2018-02-03 DIAGNOSIS — E785 Hyperlipidemia, unspecified: Secondary | ICD-10-CM | POA: Diagnosis present

## 2018-02-03 DIAGNOSIS — J90 Pleural effusion, not elsewhere classified: Principal | ICD-10-CM | POA: Diagnosis present

## 2018-02-03 DIAGNOSIS — N136 Pyonephrosis: Secondary | ICD-10-CM | POA: Diagnosis present

## 2018-02-03 DIAGNOSIS — Z452 Encounter for adjustment and management of vascular access device: Secondary | ICD-10-CM | POA: Diagnosis not present

## 2018-02-03 DIAGNOSIS — Z7982 Long term (current) use of aspirin: Secondary | ICD-10-CM

## 2018-02-03 DIAGNOSIS — G4733 Obstructive sleep apnea (adult) (pediatric): Secondary | ICD-10-CM | POA: Diagnosis present

## 2018-02-03 DIAGNOSIS — N10 Acute tubulo-interstitial nephritis: Secondary | ICD-10-CM | POA: Diagnosis not present

## 2018-02-03 DIAGNOSIS — I714 Abdominal aortic aneurysm, without rupture: Secondary | ICD-10-CM | POA: Diagnosis present

## 2018-02-03 DIAGNOSIS — Z87442 Personal history of urinary calculi: Secondary | ICD-10-CM

## 2018-02-03 DIAGNOSIS — A4151 Sepsis due to Escherichia coli [E. coli]: Secondary | ICD-10-CM | POA: Diagnosis not present

## 2018-02-03 DIAGNOSIS — R000 Tachycardia, unspecified: Secondary | ICD-10-CM | POA: Diagnosis not present

## 2018-02-03 DIAGNOSIS — I272 Pulmonary hypertension, unspecified: Secondary | ICD-10-CM | POA: Diagnosis present

## 2018-02-03 DIAGNOSIS — Z79899 Other long term (current) drug therapy: Secondary | ICD-10-CM

## 2018-02-03 DIAGNOSIS — T40605A Adverse effect of unspecified narcotics, initial encounter: Secondary | ICD-10-CM | POA: Diagnosis present

## 2018-02-03 DIAGNOSIS — T426X5A Adverse effect of other antiepileptic and sedative-hypnotic drugs, initial encounter: Secondary | ICD-10-CM | POA: Diagnosis present

## 2018-02-03 DIAGNOSIS — Z9049 Acquired absence of other specified parts of digestive tract: Secondary | ICD-10-CM

## 2018-02-03 DIAGNOSIS — N390 Urinary tract infection, site not specified: Secondary | ICD-10-CM | POA: Diagnosis present

## 2018-02-03 DIAGNOSIS — M8580 Other specified disorders of bone density and structure, unspecified site: Secondary | ICD-10-CM | POA: Diagnosis present

## 2018-02-03 DIAGNOSIS — N132 Hydronephrosis with renal and ureteral calculous obstruction: Secondary | ICD-10-CM | POA: Diagnosis not present

## 2018-02-03 DIAGNOSIS — J9621 Acute and chronic respiratory failure with hypoxia: Secondary | ICD-10-CM | POA: Diagnosis not present

## 2018-02-03 DIAGNOSIS — A499 Bacterial infection, unspecified: Secondary | ICD-10-CM | POA: Diagnosis present

## 2018-02-03 DIAGNOSIS — R52 Pain, unspecified: Secondary | ICD-10-CM

## 2018-02-03 DIAGNOSIS — Z833 Family history of diabetes mellitus: Secondary | ICD-10-CM

## 2018-02-03 DIAGNOSIS — Z882 Allergy status to sulfonamides status: Secondary | ICD-10-CM

## 2018-02-03 DIAGNOSIS — M069 Rheumatoid arthritis, unspecified: Secondary | ICD-10-CM | POA: Diagnosis present

## 2018-02-03 DIAGNOSIS — G894 Chronic pain syndrome: Secondary | ICD-10-CM | POA: Diagnosis present

## 2018-02-03 DIAGNOSIS — I482 Chronic atrial fibrillation, unspecified: Secondary | ICD-10-CM | POA: Diagnosis present

## 2018-02-03 DIAGNOSIS — B9620 Unspecified Escherichia coli [E. coli] as the cause of diseases classified elsewhere: Secondary | ICD-10-CM | POA: Diagnosis present

## 2018-02-03 LAB — CBC WITH DIFFERENTIAL/PLATELET
Basophils Absolute: 0 10*3/uL (ref 0.0–0.1)
Basophils Relative: 0 %
Eosinophils Absolute: 0.1 10*3/uL (ref 0.0–0.7)
Eosinophils Relative: 1 %
HCT: 35.1 % — ABNORMAL LOW (ref 36.0–46.0)
Hemoglobin: 11 g/dL — ABNORMAL LOW (ref 12.0–15.0)
Lymphocytes Relative: 21 %
Lymphs Abs: 1.9 10*3/uL (ref 0.7–4.0)
MCH: 29.3 pg (ref 26.0–34.0)
MCHC: 31.3 g/dL (ref 30.0–36.0)
MCV: 93.6 fL (ref 78.0–100.0)
Monocytes Absolute: 0.9 10*3/uL (ref 0.1–1.0)
Monocytes Relative: 10 %
Neutro Abs: 6.4 10*3/uL (ref 1.7–7.7)
Neutrophils Relative %: 68 %
Platelets: 190 10*3/uL (ref 150–400)
RBC: 3.75 MIL/uL — ABNORMAL LOW (ref 3.87–5.11)
RDW: 13.8 % (ref 11.5–15.5)
WBC: 9.4 10*3/uL (ref 4.0–10.5)

## 2018-02-03 LAB — CBC
HCT: 35.9 % — ABNORMAL LOW (ref 36.0–46.0)
Hemoglobin: 10.9 g/dL — ABNORMAL LOW (ref 12.0–15.0)
MCH: 28.8 pg (ref 26.0–34.0)
MCHC: 30.4 g/dL (ref 30.0–36.0)
MCV: 95 fL (ref 78.0–100.0)
Platelets: 171 10*3/uL (ref 150–400)
RBC: 3.78 MIL/uL — ABNORMAL LOW (ref 3.87–5.11)
RDW: 13.9 % (ref 11.5–15.5)
WBC: 11 10*3/uL — ABNORMAL HIGH (ref 4.0–10.5)

## 2018-02-03 LAB — URINALYSIS, ROUTINE W REFLEX MICROSCOPIC
Bilirubin Urine: NEGATIVE
Glucose, UA: 50 mg/dL — AB
Ketones, ur: 5 mg/dL — AB
Nitrite: NEGATIVE
Protein, ur: 100 mg/dL — AB
RBC / HPF: >50 RBC/hpf — ABNORMAL HIGH (ref 0–5)
Specific Gravity, Urine: 1.02 (ref 1.005–1.030)
pH: 5 (ref 5.0–8.0)

## 2018-02-03 LAB — COMPREHENSIVE METABOLIC PANEL
ALT: 9 U/L (ref 0–44)
AST: 16 U/L (ref 15–41)
Albumin: 2.6 g/dL — ABNORMAL LOW (ref 3.5–5.0)
Alkaline Phosphatase: 56 U/L (ref 38–126)
Anion gap: 7 (ref 5–15)
BUN: 20 mg/dL (ref 8–23)
CO2: 32 mmol/L (ref 22–32)
Calcium: 8.8 mg/dL — ABNORMAL LOW (ref 8.9–10.3)
Chloride: 101 mmol/L (ref 98–111)
Creatinine, Ser: 0.94 mg/dL (ref 0.44–1.00)
GFR calc Af Amer: >60 mL/min (ref >60)
GFR calc non Af Amer: 55 mL/min — ABNORMAL LOW (ref >60)
Glucose, Bld: 145 mg/dL — ABNORMAL HIGH (ref 70–99)
Potassium: 3.8 mmol/L (ref 3.5–5.1)
Sodium: 140 mmol/L (ref 135–145)
Total Bilirubin: 0.3 mg/dL (ref 0.3–1.2)
Total Protein: 6.3 g/dL — ABNORMAL LOW (ref 6.5–8.1)

## 2018-02-03 LAB — TROPONIN I: Troponin I: <0.03 ng/mL (ref <0.03)

## 2018-02-03 LAB — BRAIN NATRIURETIC PEPTIDE: B Natriuretic Peptide: 357.6 pg/mL — ABNORMAL HIGH (ref 0.0–100.0)

## 2018-02-03 MED ORDER — ERTAPENEM IV (FOR PTA / DISCHARGE USE ONLY): 1.0000 g | INTRAVENOUS | 0 refills | Status: DC

## 2018-02-03 NOTE — ED Notes (Signed)
ED TO INPATIENT HANDOFF REPORT  Name/Age/Gender [name] 84 y.o. female  Code Status Code Status History    Date Active Date Inactive Code Status Order ID Comments User Context   01/30/2018 2307 02/03/2018 1713 Full Code [id number]  Rise Patience, MD ED   09/04/2017 1601 09/08/2017 1427 Full Code [id number]  Cristal Ford, DO ED   07/03/2017 0438 07/05/2017 2017 Full Code [id number]  Norval Morton, MD ED   02/02/2017 0159 02/05/2017 1953 Full Code [id number]  Etta Quill, DO ED      Home/SNF/Other Home  Chief Complaint Low SATS, Knee Pain  Level of Care/Admitting Diagnosis ED Disposition    ED Disposition Condition [facility] Area: [facility] [[id number]]  Level of Care: Telemetry [5]  Admit to tele based on following criteria: Acute CHF  Diagnosis: Hypoxia [474259]  Admitting Physician: Elwyn Reach [[id number]]  Attending Physician: Elwyn Reach [[id number]]  PT Class (Do Not Modify): Observation [104]  PT Acc Code (Do Not Modify): Observation [10022]       Medical History Past Medical History:  Diagnosis Date  . [name] (abdominal aortic aneurysm) ([place]) 09/2013   3.6 cm  . Atrial fibrillation ([place])   . Dementia ([place])   . Diabetes mellitus    pre-diabetes  . Diverticulosis   . Hyperlipidemia   . Hypertension   . Nephrolithiasis   . OSA on CPAP   . Osteoarthritis   . Osteopenia    T=  -2.1 in hip  . Rheumatoid arthritis(714.0)   . S/P carpal tunnel release   . Spinal stenosis     Allergies Allergies  Allergen Reactions  . Sulfonamide Derivatives Hives and Rash    IV Location/Drains/Wounds Patient Lines/Drains/Airways Status   Active Line/Drains/Airways    Name:   Placement date:   Placement time:   Site:   Days:   PICC Single Lumen 02/02/18 PICC Right Brachial 38 cm 0 cm   02/02/18    2101    Brachial   1   Ureteral Drain/Stent Right ureter 6 Fr.   01/31/18    0009    Right ureter   3           Labs/Imaging Results for orders placed or performed during the hospital encounter of 02/03/18 (from the past 48 hour(s))  Comprehensive metabolic panel     Status: Abnormal   Collection Time: 02/03/18  7:52 PM  Result Value Ref Range   Sodium 140 135 - 145 mmol/L   Potassium 3.8 3.5 - 5.1 mmol/L   Chloride 101 98 - 111 mmol/L   CO2 32 22 - 32 mmol/L   Glucose, Bld 145 (H) 70 - 99 mg/dL   BUN 20 8 - 23 mg/dL   Creatinine, Ser 0.94 0.44 - 1.00 mg/dL   Calcium 8.8 (L) 8.9 - 10.3 mg/dL   Total Protein 6.3 (L) 6.5 - 8.1 g/dL   Albumin 2.6 (L) 3.5 - 5.0 g/dL   AST 16 15 - 41 U/L   ALT 9 0 - 44 U/L   Alkaline Phosphatase 56 38 - 126 U/L   Total Bilirubin 0.3 0.3 - 1.2 mg/dL   GFR calc non Af Amer 55 (L) >60 mL/min   GFR calc Af Amer >60 >60 mL/min    Comment: (NOTE) The eGFR has been calculated using the CKD EPI equation. This calculation has not been validated in all clinical situations. eGFR's persistently <60 mL/min signify possible Chronic  Kidney Disease.    Anion gap 7 5 - 15    Comment: Performed at [facility], [place] [address], [place], [place] [address]  CBC WITH DIFFERENTIAL     Status: Abnormal   Collection Time: 02/03/18  7:52 PM  Result Value Ref Range   WBC 9.4 4.0 - 10.5 K/uL   RBC 3.75 (L) 3.87 - 5.11 MIL/uL   Hemoglobin 11.0 (L) 12.0 - 15.0 g/dL   HCT 35.1 (L) 36.0 - 46.0 %   MCV 93.6 78.0 - 100.0 fL   MCH 29.3 26.0 - 34.0 pg   MCHC 31.3 30.0 - 36.0 g/dL   RDW 13.8 11.5 - 15.5 %   Platelets 190 150 - 400 K/uL   Neutrophils Relative % 68 %   Neutro Abs 6.4 1.7 - 7.7 K/uL   Lymphocytes Relative 21 %   Lymphs Abs 1.9 0.7 - 4.0 K/uL   Monocytes Relative 10 %   Monocytes Absolute 0.9 0.1 - 1.0 K/uL   Eosinophils Relative 1 %   Eosinophils Absolute 0.1 0.0 - 0.7 K/uL   Basophils Relative 0 %   Basophils Absolute 0.0 0.0 - 0.1 K/uL    Comment: Performed at [facility], [place] [address] [place], [place] [address]   Troponin I (MHP)     Status: None   Collection Time: 02/03/18  7:52 PM  Result Value Ref Range   Troponin I <0.03 <0.03 ng/mL    Comment: Performed at [facility], [place] [address], [place], [place] [address]  Brain natriuretic peptide     Status: Abnormal   Collection Time: 02/03/18  7:52 PM  Result Value Ref Range   B Natriuretic Peptide 357.6 (H) 0.0 - 100.0 pg/mL    Comment: Performed at [facility], [place] [address] [place], [place] [address]  Urinalysis, Routine w reflex microscopic     Status: Abnormal   Collection Time: 02/03/18  9:51 PM  Result Value Ref Range   Color, Urine AMBER (A) YELLOW    Comment: BIOCHEMICALS MAY BE AFFECTED BY COLOR   APPearance CLOUDY (A) CLEAR   Specific Gravity, Urine 1.020 1.005 - 1.030   pH 5.0 5.0 - 8.0   Glucose, UA 50 (A) NEGATIVE mg/dL   Hgb urine dipstick LARGE (A) NEGATIVE   Bilirubin Urine NEGATIVE NEGATIVE   Ketones, ur 5 (A) NEGATIVE mg/dL   Protein, ur 100 (A) NEGATIVE mg/dL   Nitrite NEGATIVE NEGATIVE   Leukocytes, UA SMALL (A) NEGATIVE   RBC / HPF >50 (H) 0 - 5 RBC/hpf   WBC, UA 21-50 0 - 5 WBC/hpf   Bacteria, UA RARE (A) NONE SEEN   Squamous Epithelial / LPF 0-5 0 - 5   Mucus PRESENT    Budding Yeast PRESENT     Comment: Performed at [facility], [place] [address], [place], [place] [address]   Dg Chest Port 1 View  Result Date: 02/03/2018 CLINICAL DATA:  Low O2 sats. EXAM: PORTABLE CHEST 1 VIEW COMPARISON:  February 02, 2018 FINDINGS: There is a left-sided pleural effusion with underlying opacity, similar in the interval. The probable tiny layering effusion on the right seen yesterday is not definitely seen today. A right PICC line is in good position. No pneumothorax. Stable cardiomegaly. No other change. IMPRESSION: 1. The right PICC line is in good position. 2. Left-sided pleural effusion with underlying opacity, not significantly changed. Electronically Signed   By: Dorise Bullion III M.D  On: 02/03/2018 19:31   Dg Chest Port 1 View  Result Date: 02/02/2018 CLINICAL DATA:  PICC line placement evaluation. EXAM: PORTABLE CHEST 1 VIEW COMPARISON:  Sep 04, 2017 FINDINGS: A right PICC line terminates in the central SVC. Stable cardiomegaly. Small bilateral effusions are new. No other interval changes or acute abnormalities. IMPRESSION: 1. The right PICC line terminates in the central SVC. 2. Small bilateral effusions with underlying atelectasis, new in the interval. Electronically Signed   By: Dorise Bullion III M.D   On: 02/02/2018 21:27   [place] Ekg Site Rite  Result Date: 02/02/2018 If Site Rite image not attached, placement could not be confirmed due to current cardiac rhythm.   Pending Labs [employer] (From admission, onward)    Start     Ordered   Signed and Held  CBC  (enoxaparin (LOVENOX)    CrCl >/= 30 ml/min)  Once,   R    Comments:  Baseline for enoxaparin therapy IF NOT ALREADY DRAWN.  Notify MD if PLT < 100 K.    Signed and Held   Signed and Held  Creatinine, serum  (enoxaparin (LOVENOX)    CrCl >/= 30 ml/min)  Once,   R    Comments:  Baseline for enoxaparin therapy IF NOT ALREADY DRAWN.    Signed and Held   Signed and Held  Creatinine, serum  (enoxaparin (LOVENOX)    CrCl >/= 30 ml/min)  Weekly,   R    Comments:  while on enoxaparin therapy    Signed and Held   Signed and Held  Comprehensive metabolic panel  Tomorrow morning,   R     Signed and Held   Signed and Held  CBC  Tomorrow morning,   R     Signed and Held          Vitals/Pain Today's Vitals   02/03/18 1835 02/03/18 1839 02/03/18 2001  BP: 98/70  111/79  Pulse: 99  (!) 101  Resp: 14  14  Temp: 99 F (37.2 C)    TempSrc: Oral    SpO2: 95%  94%  PainSc:  8      Isolation Precautions No active isolations  Medications Medications - No data to display  Mobility non-ambulatory

## 2018-02-03 NOTE — ED Triage Notes (Signed)
Pt is presented by EMS, reportedly low O2 SATs at home per family, pt discharged from hospital earlier today post UTI treatment. Also c/o left knee pain that she is relating to arthritis.

## 2018-02-03 NOTE — ED Provider Notes (Signed)
[place] DEPT Provider Note   CSN: [id number] Arrival date & time: 02/03/18  1831     History   Chief Complaint Chief Complaint  Patient presents with  . Low O2 SATS    @ Home  . Knee Pain    Left    HPI FLORENCE YEUNG is a 84 y.o. female.  HPI  Patient presents from home via EMS. Patient has dementia, level 5 caveat.  Patient presents due to possible hypoxia. Patient has multiple medical issues including dementia, and is a generally poor historian, but she does answer some questions briefly, seemingly appropriately. She denies new pain, acknowledges ongoing pain in her left knee, denies shortness of breath. EMS reports the patient was hypoxic, 86/87% on room air on arrival. She has a notable history of recent admission, was discharged earlier today after being diagnosed with a urinary tract infection. Patient does not have home oxygen requirement. Beyond new antibiotics via PICC line, no new medications.   Past Medical History:  Diagnosis Date  . [name] (abdominal aortic aneurysm) ([place]) 09/2013   3.6 cm  . Atrial fibrillation ([place])   . Dementia ([place])   . Diabetes mellitus    pre-diabetes  . Diverticulosis   . Hyperlipidemia   . Hypertension   . Nephrolithiasis   . OSA on CPAP   . Osteoarthritis   . Osteopenia    T=  -2.1 in hip  . Rheumatoid arthritis(714.0)   . S/P carpal tunnel release   . Spinal stenosis     Patient Active Problem List   Diagnosis Date Noted  . Urinary tract infection due to extended-spectrum beta lactamase (ESBL) producing Escherichia coli 02/02/2018  . Chronic a-fib 02/02/2018  . Acute pyelonephritis 01/31/2018  . Constipation 01/31/2018  . Urolithiasis 01/30/2018  . Hydronephrosis with renal and ureteral calculus obstruction 01/30/2018  . Fall   . T12 compression fracture ([place]) 09/04/2017  . E coli bacteremia 07/03/2017  . Fall at home, initial encounter 07/03/2017  . Tachycardia 02/03/2017  .  Sepsis secondary to UTI ([place]) 02/02/2017  . Chronic pain syndrome 02/02/2017  . Abnormal EKG 11/21/2013  . [name] (abdominal aortic aneurysm) ([place]) 09/30/2013  . OSA (obstructive sleep apnea) 06/29/2012  . Dementia ([place]) 06/29/2012  . Pre-diabetes 06/29/2012  . Hypoxia, sleep related 06/29/2012  . RA (rheumatoid arthritis) ([place]) 06/29/2012  . Nonspecific abnormal results of cardiovascular function study 06/02/2011  . Nonspecific abnormal electrocardiogram (ECG) (EKG) 05/18/2011  . OSTEOPOROSIS 04/09/2008  . FLANK PAIN, RIGHT 03/04/2008  . Hyperlipidemia 05/12/2006  . PYELONEPHRITIS, ACUTE 05/12/2006  . OSTEOARTHRITIS 05/12/2006  . DEPENDENT EDEMA, LEGS, BILATERAL 05/12/2006  . URINARY INCONTINENCE 05/12/2006  . PERIPHERAL NEUROPATHY 03/11/2006  . Essential hypertension 03/11/2006  . DYSURIA 03/11/2006    Past Surgical History:  Procedure Laterality Date  . APPENDECTOMY    . CARPAL TUNNEL RELEASE  12/27/2006   Right subcutaneous ulnar nerve transfer -- Right open carpal tunnel release  . CHOLECYSTECTOMY    . CYSTOSCOPY W/ URETERAL STENT PLACEMENT Right 01/30/2018   Procedure: CYSTOSCOPY WITH STENT REPLACEMENT retrograde pylegram;  Surgeon: Ardis Hughs, MD;  Location: [facility] ORS;  Service: Urology;  Laterality: Right;  . FOOT SURGERY    . JOINT REPLACEMENT     BTKR, RTHR  . TOTAL KNEE ARTHROPLASTY  08/21/2007   Right total knee replacement     OB History   None      Home Medications    Prior to Admission medications   Medication  Sig Start Date End Date Taking? Authorizing Provider  aspirin 81 MG chewable tablet Chew 81 mg by mouth daily.    [provider]  atenolol (TENORMIN) 50 MG tablet Take 1 tablet (50 mg total) by mouth daily. 08/24/17   [name], MD  DULoxetine (CYMBALTA) 30 MG capsule Take 90 mg by mouth every morning.  07/29/14   [provider]  ertapenem (INVANZ) IVPB Inject 1 g into the vein daily for 12 days. Indication:   Bacteremia Last Day of Therapy:  02/14/2018 Labs - Once weekly:  CBC/D and BMP, Labs - Every other week:  ESR and CRP 02/03/18 02/15/18  Georgette Shell, MD  gabapentin (NEURONTIN) 800 MG tablet TAKE 1 TABLET BY MOUTH EVERY MORNING AND AT LUNCH, 2 TABLETS BY MOUTH EVERY NIGHT AT BEDTIME 12/25/17   Hyatt, Max T, DPM  oxymorphone (OPANA) 5 MG tablet Take 1 tablet (5 mg total) by mouth 3 (three) times daily. 09/08/17   Charlynne Cousins, MD  rosuvastatin (CRESTOR) 40 MG tablet TAKE 1 TABLET BY MOUTH EVERY DAY 12/25/17   [name], MD    Family History Family History  Problem Relation Age of Onset  . Stroke Father   . Coronary artery disease Son        CABG at age 49  . Heart disease Son   . Heart attack Son   . Stroke Mother   . Hypertension Sister   . Diabetes Brother   . Hypertension Maternal Aunt     Social History Social History   Tobacco Use  . Smoking status: Never Smoker  . Smokeless tobacco: Never Used  Substance Use Topics  . Alcohol use: No  . Drug use: No     Allergies   Sulfonamide derivatives   Review of Systems Review of Systems  Unable to perform ROS: Dementia     Physical Exam Updated Vital Signs There were no vitals taken for this visit.  Physical Exam  Constitutional: She appears well-developed and well-nourished. She has a sickly appearance. No distress.  Sickly appearing elderly female arriving in scrubs, with bedbugs climbing across her chest.  HENT:  Head: Normocephalic and atraumatic.  Eyes: Conjunctivae and EOM are normal.  Cardiovascular: An irregularly irregular rhythm present. Tachycardia present.  Pulmonary/Chest: She has decreased breath sounds.  Diminished respiratory effort  Abdominal: She exhibits no distension.  Musculoskeletal: She exhibits no edema.  Neurological: She is alert. She displays atrophy. No cranial nerve deficit.  Skin: Skin is warm and dry.  Right PICC line  Psychiatric: She is slowed and  withdrawn. Cognition and memory are impaired.  Nursing note and vitals reviewed.    ED Treatments / Results  Labs (all labs ordered are listed, but only abnormal results are displayed) Labs Reviewed  COMPREHENSIVE METABOLIC PANEL - Abnormal; Notable for the following components:      Result Value   Glucose, Bld 145 (*)    Calcium 8.8 (*)    Total Protein 6.3 (*)    Albumin 2.6 (*)    GFR calc non Af Amer 55 (*)    All other components within normal limits  CBC WITH DIFFERENTIAL/PLATELET - Abnormal; Notable for the following components:   RBC 3.75 (*)    Hemoglobin 11.0 (*)    HCT 35.1 (*)    All other components within normal limits  BRAIN NATRIURETIC PEPTIDE - Abnormal; Notable for the following components:   B Natriuretic Peptide 357.6 (*)    All other  components within normal limits  TROPONIN I    EKG EKG Interpretation  Date/Time:  Saturday February 03 2018 20:05:19 EDT Ventricular Rate:  100 PR Interval:    QRS Duration: 74 QT Interval:  300 QTC Calculation: 387 R Axis:   8 Text Interpretation:  Atrial fibrillation Low voltage, precordial leads Borderline T abnormalities, anterior leads Abnormal ekg Confirmed by Carmin Muskrat [phone]) on 02/03/2018 8:12:15 PM   Radiology Dg Chest Port 1 View  Result Date: 02/02/2018 CLINICAL DATA:  PICC line placement evaluation. EXAM: PORTABLE CHEST 1 VIEW COMPARISON:  Sep 04, 2017 FINDINGS: A right PICC line terminates in the central SVC. Stable cardiomegaly. Small bilateral effusions are new. No other interval changes or acute abnormalities. IMPRESSION: 1. The right PICC line terminates in the central SVC. 2. Small bilateral effusions with underlying atelectasis, new in the interval. Electronically Signed   By: Dorise Bullion III M.D   On: 02/02/2018 21:27   [place] Ekg Site Rite  Result Date: 02/02/2018 If Site Rite image not attached, placement could not be confirmed due to current cardiac rhythm.   Procedures Procedures  (including critical care time)  Medications Ordered in ED Medications - No data to display   Initial Impression / Assessment and Plan / ED Course  I have reviewed the triage vital signs and the nursing notes.  Pertinent labs & imaging results that were available during my care of the patient were reviewed by me and considered in my medical decision making (see chart for details).    After the initial evaluation I reviewed the patient's chart including recent hospitalization for stone, infection, pertinent notes as below: Urology consulted, performed emergent right ureteral stent in OR and plan for antibiotics for 2 weeks then schedule for removal of her stone after completion of antibiotics.  Both urine and blood culture actually grew ESBL E coli. Antibiotics switched to iv Meropenem.   9:11 PM Patient now accompanied by her daughter. She notes that the patient continues to appear unwell, as on discharge earlier today. She affirms that the patient has no pulmonary disease. Initial findings tonight consistent with pleural effusions which may be contributing to the patient's new oxygen requirement.  Vital signs from earlier today, notable for no hypoxia.  Urology consulted, performed emergent right ureteral stent in OR and plan for antibiotics for 2 weeks then schedule for removal of her stone after completion of antibiotics.  Both urine and blood culture actually grew ESBL E coli. Antibiotics switched to iv Meropenem.    This elderly female presents same day as discharge after hospitalization for infected kidney stone, now with concern for hypoxia. The patient himself does have some dementia, limiting the HPI, but seemingly denies complaints, beyond weakness. Patient is receiving IV meropenem, and here is afebrile, with no focal pain. Sepsis is a consideration, but seems less likely given her ongoing IV antibiotic use. Patient found to have bilateral pleural effusions, possibly due to  iatrogenic fluid resuscitation. Patient does not have chest pain, denies subjective dyspnea Patient is not a candidate for anticoagulant therapy, per prior notes, and though pulmonary embolism is a consideration, absent chest pain or subjective dyspnea, seems less likely, and the patient would not be a candidate for therapy regardless. Here the patient had improvement with supplemental oxygen, and given concern for this new oxygen requirement will require admission for further evaluation and management.  Final Clinical Impressions(s) / ED Diagnoses  Hypoxia   Carmin Muskrat, MD 02/03/18 2117

## 2018-02-03 NOTE — ED Notes (Signed)
Bed: VH46 Expected date:  Expected time:  Means of arrival:  Comments: Low sats

## 2018-02-03 NOTE — Progress Notes (Addendum)
Contacted [facility] to make aware of pts scheduled dc home today with [facility] RN visit for IV abx dose of Invanz 02/03/2018. Jonnie Finner RN CCM Case Mgmt phone [phone]  Contacted son, Audelia Acton to follow up on dc time. States he has heard from [facility] and they want him to contact [facility] when pt arrives home. Per [facility] rep, pt was scheduled for a HHRN start of care visit at 2 pm. Jonnie Finner RN CCM Case Mgmt phone [phone]

## 2018-02-03 NOTE — Discharge Summary (Signed)
Physician Discharge Summary  [name]:[id number] DOB: [birth year] DOA: 01/30/2018  PCP: [name], MD  Admit date: 01/30/2018 Discharge date: 02/03/2018  Admitted From: home Disposition: home  Recommendations for Outpatient Follow-up:  1. Follow up with PCP in 1-2 weeks 2. Please obtain BMP/CBC in one week 3. Follow-up with urology  Home Health [facility] for IV antibiotics Equipment/Devices: None  Discharge Condition stable CODE STATUS full code Diet recommendation: Cardiac  Brief/Interim Summary:84 y.o.femalewithhistory of chronic atrial fibrillation not on anticoagulation secondary to multiple falls, abdominal aortic aneurysm being followed by cardiologist, HTN, hyperlipidemia, OSA, dementia, chronic pain p/w worsening right flank pain, fever, chills and was admitted for sepsis due to acute pyelonephritis, with high fever, leukocytosis, Afib RVR. Started on IVF and iv zosyn empirically. Both urine and blood culture actually grew ESBL E coli. Antibiotics switched to iv Meropenem. CT abdomen pelvis (stone protocol) revealed right proximal ureteral 6 mm obstructing calculus with associated right hydroureteronephrosis and perinephric/ureteral inflammation and edema. Urology consulted, performed emergent right ureteral stent in OR and plan for antibiotics for 2 weeks then schedule for removal of her stone after completion of antibiotics.   Discharge Diagnoses:  Principal Problem:   Urinary tract infection due to extended-spectrum beta lactamase (ESBL) producing Escherichia coli Active Problems:   Essential hypertension   OSA (obstructive sleep apnea)   Dementia (HCC)   E coli bacteremia   Urolithiasis   Hydronephrosis with renal and ureteral calculus obstruction   Acute pyelonephritis   Constipation   Chronic a-fib  ESBL E. coli UTI with hydronephrosis status post right ureteral stent started on meropenem.  Patient discharging today with [facility] home [facility]  to give her IV antibiotics.  Patient will follow-up with urologist as an outpatient for removal of the stone.  CT the abdomen and pelvis showed right proximal ureteral 6 mm obstructing stone with associated right hydroureteronephrosis and perinephric and ureteral inflammation and edema.  PICC line placed to 02/02/2018.  Patient will be discharged on Invanz 1 g twice a day last day of therapy 02/14/2018.   Hypertension stable continue atenolol  Active sleep apnea patient has CPAP at home.  Hyperlipidemia continue Crestor  Discharge Instructions  Discharge Instructions    Diet - low sodium heart healthy   Complete by:  As directed    Home infusion instructions [facility] May follow [place] Dosing Protocol; May administer Cathflo as needed to maintain patency of vascular access device.; Flushing of vascular access device: per [facility] Protocol: 0.9% NaCl pre/post medica...   Complete by:  As directed    Instructions:  May follow [place] Dosing Protocol   Instructions:  May administer Cathflo as needed to maintain patency of vascular access device.   Instructions:  Flushing of vascular access device: per [facility] Protocol: 0.9% NaCl pre/post medication administration and prn patency; Heparin 100 u/ml, 52m for implanted ports and Heparin 10u/ml, 53mfor all other central venous catheters.   Instructions:  May follow [facility] Anaphylaxis Protocol for First Dose Administration in the home: 0.9% NaCl at 25-50 ml/hr to maintain IV access for protocol meds. Epinephrine 0.3 ml IV/IM PRN and Benadryl 25-50 IV/IM PRN s/s of anaphylaxis.   Instructions:  [facility] Coordinator (RN) to assist per patient IV care needs in the home PRN.   Increase activity slowly   Complete by:  As directed      Allergies as of 02/03/2018      Reactions   Sulfonamide Derivatives Hives,  Rash      Medication List    TAKE these medications   aspirin 81 MG chewable tablet Chew 81 mg by mouth daily.    atenolol 50 MG tablet Commonly known as:  TENORMIN Take 1 tablet (50 mg total) by mouth daily.   DULoxetine 30 MG capsule Commonly known as:  CYMBALTA Take 90 mg by mouth every morning.   ertapenem  IVPB Commonly known as:  INVANZ Inject 1 g into the vein daily for 12 days. Indication:  Bacteremia Last Day of Therapy:  02/14/2018 Labs - Once weekly:  CBC/D and BMP, Labs - Every other week:  ESR and CRP   gabapentin 800 MG tablet Commonly known as:  NEURONTIN TAKE 1 TABLET BY MOUTH EVERY MORNING AND AT LUNCH, 2 TABLETS BY MOUTH EVERY NIGHT AT BEDTIME   oxymorphone 5 MG tablet Commonly known as:  OPANA Take 1 tablet (5 mg total) by mouth 3 (three) times daily.   rosuvastatin 40 MG tablet Commonly known as:  CRESTOR TAKE 1 TABLET BY MOUTH EVERY DAY            Home Infusion Instuctions  (From admission, onward)         Start     Ordered   02/03/18 0000  Home infusion instructions [facility] May follow [place] Dosing Protocol; May administer Cathflo as needed to maintain patency of vascular access device.; Flushing of vascular access device: per [facility] Protocol: 0.9% NaCl pre/post medica...    Question Answer Comment  Instructions May follow [place] Dosing Protocol   Instructions May administer Cathflo as needed to maintain patency of vascular access device.   Instructions Flushing of vascular access device: per [facility] Protocol: 0.9% NaCl pre/post medication administration and prn patency; Heparin 100 u/ml, 30m for implanted ports and Heparin 10u/ml, 571mfor all other central venous catheters.   Instructions May follow [facility] Anaphylaxis Protocol for First Dose Administration in the home: 0.9% NaCl at 25-50 ml/hr to maintain IV access for protocol meds. Epinephrine 0.3 ml IV/IM PRN and Benadryl 25-50 IV/IM PRN s/s of anaphylaxis.   Instructions [facility] Infusion Coordinator (RN) to assist per patient IV care needs in the home PRN.      02/03/18 [id number]        Follow-up Information    HeArdis HughsMD In 2 weeks.   Specialty:  Urology Why:  for surgery to remove the kidney stone. Contact information: [address] [place] [phone]      Health, [facility]-Home Follow up.   Specialty:  Home Health Services [facility]  [facility] abx instruction/physical/occupational therapy/aide,social worker Contact information: [phone]          Allergies  Allergen Reactions  . Sulfonamide Derivatives Hives and Rash    Consultations: [facility] Procedures/Studies: Dg Chest Port 1 View  Result Date: 02/02/2018 CLINICAL DATA:  PICC line placement evaluation. EXAM: PORTABLE CHEST 1 VIEW COMPARISON:  Sep 04, 2017 FINDINGS: A right PICC line terminates in the central SVC. Stable cardiomegaly. Small bilateral effusions are new. No other interval changes or acute abnormalities. IMPRESSION: 1. The right PICC line terminates in the central SVC. 2. Small bilateral effusions with underlying atelectasis, new in the interval. Electronically Signed   By: DaDorise BullionII M.D   On: 02/02/2018 21:27   Dg C-arm 1-60 Min-no Report  Result Date: 01/31/2018 Fluoroscopy was utilized by the  requesting physician.  No radiographic interpretation.   Ct Renal Stone Study  Result Date: 01/30/2018 CLINICAL DATA:  Acute abdominal flank pain worse on the right EXAM: CT ABDOMEN AND PELVIS WITHOUT CONTRAST TECHNIQUE: Multidetector CT imaging of the abdomen and pelvis was performed following the standard protocol without IV contrast. COMPARISON:  09/04/2017 FINDINGS: Lower chest: Bibasilar atelectasis. Mild cardiomegaly. No pericardial or pleural effusion. Small hiatal hernia. Descending thoracic aorta is tortuous and ectatic with atherosclerosis. Hepatobiliary: No focal liver abnormality is seen. Status post cholecystectomy. No biliary dilatation. Pancreas: Unremarkable. No pancreatic  ductal dilatation or surrounding inflammatory changes. Spleen: Normal in size without focal abnormality. Adrenals/Urinary Tract: Normal adrenal glands. Right kidney demonstrates acute perinephric strandy edema/inflammation. There is associated moderate right hydroureteronephrosis secondary to a proximal obstructing right ureteral calculus measuring 6 mm, image 34 series 2. There is a stable 2.5 cm right kidney lower pole hypodense cyst medially. Left kidney and ureter demonstrate no acute process or obstruction. Left ureter is nondilated. Air noted within the bladder dome.  Bladder otherwise unremarkable. Stomach/Bowel: Negative for bowel obstruction, significant dilatation, ileus, or free air. Appendix not visualized. No fluid collection or abscess. Moderate colonic stool burden. Extensive sigmoid diverticulosis without acute inflammatory process. Vascular/Lymphatic: Atherosclerosis of the aorta. Infrarenal aorta has aneurysm dilatation measuring 3.9 cm. Extensive iliac atherosclerosis as well. No retroperitoneal hemorrhage or hematoma. No adenopathy. Reproductive: Remote hysterectomy. No other pelvic abnormality, fluid collection, ascites, or abscess. Other: No abdominal wall hernia or abnormality. No abdominopelvic ascites. Musculoskeletal: Bones are osteopenic. Degenerative changes of the spine. Extensive facet arthropathy. T12 and L5 subacute partially healing compression fractures noted when compared to 07/03/2017. IMPRESSION: Right proximal ureteral 6 mm obstructing calculus with associated right hydroureteronephrosis and perinephric/ureteral inflammation and edema. 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 Remote cholecystectomy, appendectomy and hysterectomy Colonic diverticulosis without acute inflammation or complicating feature Subacute T12 and L5 compression fractures, new since 07/03/2017 Electronically Signed   By: Jerilynn Mages.  Shick M.D.   On:  01/30/2018 22:00   [place] Ekg Site Rite  Result Date: 02/02/2018 If Site Rite image not attached, placement could not be confirmed due to current cardiac rhythm.   (Echo, Carotid, EGD, Colonoscopy, ERCP)    Subjective:   Discharge Exam: Vitals:   02/02/18 2126 02/03/18 0513  BP: 99/62 113/83  Pulse: 93 80  Resp: 14 14  Temp: 97.9 F (36.6 C) 98.1 F (36.7 C)  SpO2: 99% 96%   Vitals:   02/02/18 1200 02/02/18 1402 02/02/18 2126 02/03/18 0513  BP: 120/70 107/63 99/62 113/83  Pulse: 81 74 93 80  Resp:  18 14 14   Temp:  98.1 F (36.7 C) 97.9 F (36.6 C) 98.1 F (36.7 C)  TempSrc:  Oral Oral Oral  SpO2:  97% 99% 96%  Weight:      Height:        General: Pt is alert, awake, not in acute distress Cardiovascular: RRR, S1/S2 +, no rubs, no gallops Respiratory: CTA bilaterally, no wheezing, no rhonchi Abdominal: Soft, NT, ND, bowel sounds + Extremities: no edema, no cyanosis    The results of significant diagnostics from this hospitalization (including imaging, microbiology, ancillary and laboratory) are listed below for reference.     Microbiology: Recent Results (from the past 240 hour(s))  Blood Culture (routine x 2)     Status: Abnormal   Collection Time: 01/30/18  8:57 PM  Result Value Ref Range Status   Specimen Description   Final  BLOOD RIGHT WRIST Performed at [facility], [place] [address], [place], [place] [address]    Special Requests   Final    BOTTLES DRAWN AEROBIC AND ANAEROBIC Blood Culture adequate volume Performed at [facility] [address], [place], [place] [address]    Culture  Setup Time   Final    GRAM NEGATIVE RODS IN BOTH AEROBIC AND ANAEROBIC BOTTLES CRITICAL RESULT CALLED TO, READ BACK BY AND VERIFIED WITH: Karie Kirks PharmD 13:05 01/31/18 ([email or web address]) Performed at [facility], [address], [place], [place] [address]    Culture (A)  Final    ESCHERICHIA COLI Confirmed Extended Spectrum Beta-Lactamase  Producer (ESBL).  In bloodstream infections from ESBL organisms, carbapenems are preferred over piperacillin/tazobactam. They are shown to have a lower risk of mortality.    Report Status 02/02/2018 FINAL  Final   Organism ID, Bacteria ESCHERICHIA COLI  Final      Susceptibility   Escherichia coli - MIC*    AMPICILLIN >=32 RESISTANT Resistant     CEFAZOLIN >=64 RESISTANT Resistant     CEFEPIME >=64 RESISTANT Resistant     CEFTAZIDIME >=64 RESISTANT Resistant     CEFTRIAXONE >=64 RESISTANT Resistant     CIPROFLOXACIN >=4 RESISTANT Resistant     GENTAMICIN <=1 SENSITIVE Sensitive     IMIPENEM <=0.25 SENSITIVE Sensitive     TRIMETH/SULFA >=320 RESISTANT Resistant     AMPICILLIN/SULBACTAM >=32 RESISTANT Resistant     PIP/TAZO 16 SENSITIVE Sensitive     Extended ESBL POSITIVE Resistant     * ESCHERICHIA COLI  Blood Culture ID Panel (Reflexed)     Status: Abnormal   Collection Time: 01/30/18  8:57 PM  Result Value Ref Range Status   Enterococcus species NOT DETECTED NOT DETECTED Final   Listeria monocytogenes NOT DETECTED NOT DETECTED Final   Staphylococcus species NOT DETECTED NOT DETECTED Final   Staphylococcus aureus (BCID) NOT DETECTED NOT DETECTED Final   Streptococcus species NOT DETECTED NOT DETECTED Final   Streptococcus agalactiae NOT DETECTED NOT DETECTED Final   Streptococcus pneumoniae NOT DETECTED NOT DETECTED Final   Streptococcus pyogenes NOT DETECTED NOT DETECTED Final   Acinetobacter baumannii NOT DETECTED NOT DETECTED Final   Enterobacteriaceae species DETECTED (A) NOT DETECTED Final    Comment: Enterobacteriaceae represent a large family of gram-negative bacteria, not a single organism. CRITICAL RESULT CALLED TO, READ BACK BY AND VERIFIED WITH: Karie Kirks PharmD 13:05 01/31/18 ([email or web address])    Enterobacter cloacae complex NOT DETECTED NOT DETECTED Final   Escherichia coli DETECTED (A) NOT DETECTED Final    Comment: CRITICAL RESULT CALLED TO, READ BACK BY AND VERIFIED  WITH: Karie Kirks PharmD 13:05 01/31/18 ([email or web address])    Klebsiella oxytoca NOT DETECTED NOT DETECTED Final   Klebsiella pneumoniae NOT DETECTED NOT DETECTED Final   Proteus species NOT DETECTED NOT DETECTED Final   Serratia marcescens NOT DETECTED NOT DETECTED Final   Carbapenem resistance NOT DETECTED NOT DETECTED Final   Haemophilus influenzae NOT DETECTED NOT DETECTED Final   Neisseria meningitidis NOT DETECTED NOT DETECTED Final   Pseudomonas aeruginosa NOT DETECTED NOT DETECTED Final   Candida albicans NOT DETECTED NOT DETECTED Final   Candida glabrata NOT DETECTED NOT DETECTED Final   Candida krusei NOT DETECTED NOT DETECTED Final   Candida parapsilosis NOT DETECTED NOT DETECTED Final   Candida tropicalis NOT DETECTED NOT DETECTED Final    Comment: Performed at [facility], [place] [address], [place], [place] [address]  Blood  Culture (routine x 2)     Status: Abnormal   Collection Time: 01/30/18  9:12 PM  Result Value Ref Range Status   Specimen Description   Final    BLOOD RIGHT ANTECUBITAL Performed at [facility] [address], [place], [place] [address]    Special Requests   Final    BOTTLES DRAWN AEROBIC AND ANAEROBIC Blood Culture adequate volume Performed at [facility] [address], [place], [place] [address]    Culture  Setup Time   Final    GRAM NEGATIVE RODS IN BOTH AEROBIC AND ANAEROBIC BOTTLES CRITICAL VALUE NOTED.  VALUE IS CONSISTENT WITH PREVIOUSLY REPORTED AND CALLED VALUE.    Culture (A)  Final    ESCHERICHIA COLI SUSCEPTIBILITIES PERFORMED ON PREVIOUS CULTURE WITHIN THE LAST 5 DAYS. Performed at [facility], [place] [address] [place], [place] [address]    Report Status 02/02/2018 FINAL  Final  Urine Culture     Status: Abnormal   Collection Time: 01/31/18 12:07 AM  Result Value Ref Range Status   Specimen Description   Final    CYSTOSCOPY URINE Performed at [facility]  [address], [place], [place] [address]    Special Requests   Final    NONE Performed at [facility], [place] [address] [place], [place] [address]    Culture (A)  Final    >=100,000 COLONIES/mL ESCHERICHIA COLI Confirmed Extended Spectrum Beta-Lactamase Producer (ESBL).  In bloodstream infections from ESBL organisms, carbapenems are preferred over piperacillin/tazobactam. They are shown to have a lower risk of mortality.    Report Status 02/02/2018 FINAL  Final   Organism ID, Bacteria ESCHERICHIA COLI (A)  Final      Susceptibility   Escherichia coli - MIC*    AMPICILLIN >=32 RESISTANT Resistant     CEFAZOLIN >=64 RESISTANT Resistant     CEFEPIME >=64 RESISTANT Resistant     CEFTAZIDIME 16 INTERMEDIATE Intermediate     CEFTRIAXONE >=64 RESISTANT Resistant     CIPROFLOXACIN >=4 RESISTANT Resistant     GENTAMICIN <=1 SENSITIVE Sensitive     IMIPENEM <=0.25 SENSITIVE Sensitive     TRIMETH/SULFA >=320 RESISTANT Resistant     AMPICILLIN/SULBACTAM >=32 RESISTANT Resistant     PIP/TAZO <=4 SENSITIVE Sensitive     Extended ESBL POSITIVE Resistant     * >=100,000 COLONIES/mL ESCHERICHIA COLI  Urine Culture     Status: Abnormal   Collection Time: 01/31/18  1:37 AM  Result Value Ref Range Status   Specimen Description   Final    URINE, CATHETERIZED Performed at [facility] [address] [place], [place] [address]    Special Requests   Final    NONE Performed at [facility], [place] [address], [place], [place] [address]    Culture (A)  Final    80,000 COLONIES/mL ESCHERICHIA COLI Confirmed Extended Spectrum Beta-Lactamase Producer (ESBL).  In bloodstream infections from ESBL organisms, carbapenems are preferred over piperacillin/tazobactam. They are shown to have a lower risk of mortality.    Report Status 02/02/2018 FINAL  Final   Organism ID, Bacteria ESCHERICHIA COLI (A)  Final      Susceptibility   Escherichia coli - MIC*     AMPICILLIN >=32 RESISTANT Resistant     CEFAZOLIN >=64 RESISTANT Resistant     CEFTRIAXONE >=64 RESISTANT Resistant     CIPROFLOXACIN >=4 RESISTANT Resistant     GENTAMICIN <=1 SENSITIVE Sensitive  IMIPENEM <=0.25 SENSITIVE Sensitive     NITROFURANTOIN <=16 SENSITIVE Sensitive     TRIMETH/SULFA >=320 RESISTANT Resistant     AMPICILLIN/SULBACTAM >=32 RESISTANT Resistant     PIP/TAZO 16 SENSITIVE Sensitive     Extended ESBL POSITIVE Resistant     * 80,000 COLONIES/mL ESCHERICHIA COLI  Culture, blood (routine x 2)     Status: None (Preliminary result)   Collection Time: 02/02/18  9:13 AM  Result Value Ref Range Status   Specimen Description   Final    BLOOD RIGHT ARM Performed at [facility] [address] [place], [place] [address]    Special Requests   Final    BOTTLES DRAWN AEROBIC AND ANAEROBIC Blood Culture adequate volume Performed at [facility] [address], [place], [place] [address]    Culture   Final    NO GROWTH < 12 HOURS Performed at [facility] [address] [place], [place] [address]    Report Status PENDING  Incomplete  Culture, blood (routine x 2)     Status: None (Preliminary result)   Collection Time: 02/02/18  9:13 AM  Result Value Ref Range Status   Specimen Description   Final    BLOOD LEFT HAND Performed at [facility] [address], [place], [place] [address]    Special Requests   Final    BOTTLES DRAWN AEROBIC ONLY Blood Culture adequate volume Performed at [facility] [address] [place], [place] [address]    Culture   Final    NO GROWTH < 12 HOURS Performed at [facility] [address] [place], [place] [address]    Report Status PENDING  Incomplete     Labs: BNP (last 3 results) No results for input(s): BNP in the last 8760 hours. Basic Metabolic Panel: Recent Labs  Lab 01/30/18 2052 01/31/18 0124 02/02/18 0531  NA 140 140  140  K 4.6 4.1 4.2  CL 99 103 104  CO2 29 26 27   GLUCOSE 114* 132* 97  BUN 18 18 19   CREATININE 1.03* 1.06* 0.98  CALCIUM 9.2 8.6* 8.6*   Liver Function Tests: Recent Labs  Lab 01/30/18 2052 01/31/18 0124  AST 22 19  ALT 13 13  ALKPHOS 68 59  BILITOT 1.0 0.9  PROT 7.5 6.5  ALBUMIN 3.6 3.1*   Recent Labs  Lab 01/30/18 2052  LIPASE 23   No results for input(s): AMMONIA in the last 168 hours. CBC: Recent Labs  Lab 01/30/18 2052 01/31/18 0124 02/01/18 0525 02/02/18 0531 02/03/18 0522  WBC 13.4* 14.0* 12.7* 7.6 11.0*  NEUTROABS 11.0* 11.9*  --   --   --   HGB 13.7 11.6* 11.8* 10.7* 10.9*  HCT 42.8 36.8 38.5 35.0* 35.9*  MCV 93.2 92.5 94.4 95.1 95.0  PLT 185 178 187 180 171   Cardiac Enzymes: Recent Labs  Lab 02/02/18 1418  TROPONINI <0.03   BNP: Invalid input(s): POCBNP CBG: Recent Labs  Lab 01/31/18 0053  GLUCAP 122*   D-Dimer No results for input(s): DDIMER in the last 72 hours. Hgb A1c No results for input(s): HGBA1C in the last 72 hours. Lipid Profile No results for input(s): CHOL, HDL, LDLCALC, TRIG, CHOLHDL, LDLDIRECT in the last 72 hours. Thyroid function studies No results for input(s): TSH, T4TOTAL, T3FREE, THYROIDAB in the last 72 hours.  Invalid input(s): FREET3 Anemia work up No results for input(s): VITAMINB12, FOLATE, FERRITIN, TIBC, IRON, RETICCTPCT in the  last 72 hours. Urinalysis    Component Value Date/Time   COLORURINE YELLOW 01/31/2018 0137   APPEARANCEUR HAZY (A) 01/31/2018 0137   LABSPEC 1.003 (L) 01/31/2018 0137   PHURINE 7.0 01/31/2018 0137   GLUCOSEU NEGATIVE 01/31/2018 0137   GLUCOSEU NEG mg/dL 03/08/2007 2031   HGBUR LARGE (A) 01/31/2018 0137   HGBUR negative 03/06/2008 0846   BILIRUBINUR NEGATIVE 01/31/2018 0137   KETONESUR NEGATIVE 01/31/2018 0137   PROTEINUR 30 (A) 01/31/2018 0137   UROBILINOGEN 0.2 09/20/2013 1535   NITRITE NEGATIVE 01/31/2018 0137   LEUKOCYTESUR LARGE (A) 01/31/2018 0137   Sepsis  Labs Invalid input(s): PROCALCITONIN,  WBC,  LACTICIDVEN Microbiology Recent Results (from the past 240 hour(s))  Blood Culture (routine x 2)     Status: Abnormal   Collection Time: 01/30/18  8:57 PM  Result Value Ref Range Status   Specimen Description   Final    BLOOD RIGHT WRIST Performed at [facility], [place] [address] [place], [place] [address]    Special Requests   Final    BOTTLES DRAWN AEROBIC AND ANAEROBIC Blood Culture adequate volume Performed at [facility] [address] [place], [place] [address]    Culture  Setup Time   Final    GRAM NEGATIVE RODS IN BOTH AEROBIC AND ANAEROBIC BOTTLES CRITICAL RESULT CALLED TO, READ BACK BY AND VERIFIED WITH: Karie Kirks PharmD 13:05 01/31/18 ([email or web address]) Performed at [facility], [address] [place], [place] [address]    Culture (A)  Final    ESCHERICHIA COLI Confirmed Extended Spectrum Beta-Lactamase Producer (ESBL).  In bloodstream infections from ESBL organisms, carbapenems are preferred over piperacillin/tazobactam. They are shown to have a lower risk of mortality.    Report Status 02/02/2018 FINAL  Final   Organism ID, Bacteria ESCHERICHIA COLI  Final      Susceptibility   Escherichia coli - MIC*    AMPICILLIN >=32 RESISTANT Resistant     CEFAZOLIN >=64 RESISTANT Resistant     CEFEPIME >=64 RESISTANT Resistant     CEFTAZIDIME >=64 RESISTANT Resistant     CEFTRIAXONE >=64 RESISTANT Resistant     CIPROFLOXACIN >=4 RESISTANT Resistant     GENTAMICIN <=1 SENSITIVE Sensitive     IMIPENEM <=0.25 SENSITIVE Sensitive     TRIMETH/SULFA >=320 RESISTANT Resistant     AMPICILLIN/SULBACTAM >=32 RESISTANT Resistant     PIP/TAZO 16 SENSITIVE Sensitive     Extended ESBL POSITIVE Resistant     * ESCHERICHIA COLI  Blood Culture ID Panel (Reflexed)     Status: Abnormal   Collection Time: 01/30/18  8:57 PM  Result Value Ref Range Status   Enterococcus species NOT DETECTED NOT DETECTED Final   Listeria  monocytogenes NOT DETECTED NOT DETECTED Final   Staphylococcus species NOT DETECTED NOT DETECTED Final   Staphylococcus aureus (BCID) NOT DETECTED NOT DETECTED Final   Streptococcus species NOT DETECTED NOT DETECTED Final   Streptococcus agalactiae NOT DETECTED NOT DETECTED Final   Streptococcus pneumoniae NOT DETECTED NOT DETECTED Final   Streptococcus pyogenes NOT DETECTED NOT DETECTED Final   Acinetobacter baumannii NOT DETECTED NOT DETECTED Final   Enterobacteriaceae species DETECTED (A) NOT DETECTED Final    Comment: Enterobacteriaceae represent a large family of gram-negative bacteria, not a single organism. CRITICAL RESULT CALLED TO, READ BACK BY AND VERIFIED WITH: Karie Kirks PharmD 13:05 01/31/18 ([email or web address])    Enterobacter cloacae complex NOT DETECTED NOT DETECTED Final   Escherichia coli DETECTED (A) NOT DETECTED Final  Comment: CRITICAL RESULT CALLED TO, READ BACK BY AND VERIFIED WITH: Karie Kirks PharmD 13:05 01/31/18 ([email or web address])    Klebsiella oxytoca NOT DETECTED NOT DETECTED Final   Klebsiella pneumoniae NOT DETECTED NOT DETECTED Final   Proteus species NOT DETECTED NOT DETECTED Final   Serratia marcescens NOT DETECTED NOT DETECTED Final   Carbapenem resistance NOT DETECTED NOT DETECTED Final   Haemophilus influenzae NOT DETECTED NOT DETECTED Final   Neisseria meningitidis NOT DETECTED NOT DETECTED Final   Pseudomonas aeruginosa NOT DETECTED NOT DETECTED Final   Candida albicans NOT DETECTED NOT DETECTED Final   Candida glabrata NOT DETECTED NOT DETECTED Final   Candida krusei NOT DETECTED NOT DETECTED Final   Candida parapsilosis NOT DETECTED NOT DETECTED Final   Candida tropicalis NOT DETECTED NOT DETECTED Final    Comment: Performed at [facility], [place] [address] [place], [place] [address]  Blood Culture (routine x 2)     Status: Abnormal   Collection Time: 01/30/18  9:12 PM  Result Value Ref Range Status   Specimen Description   Final    BLOOD RIGHT  ANTECUBITAL Performed at [facility] [address] [place], [place] [address]    Special Requests   Final    BOTTLES DRAWN AEROBIC AND ANAEROBIC Blood Culture adequate volume Performed at [facility] [address], [place], [place] [address]    Culture  Setup Time   Final    GRAM NEGATIVE RODS IN BOTH AEROBIC AND ANAEROBIC BOTTLES CRITICAL VALUE NOTED.  VALUE IS CONSISTENT WITH PREVIOUSLY REPORTED AND CALLED VALUE.    Culture (A)  Final    ESCHERICHIA COLI SUSCEPTIBILITIES PERFORMED ON PREVIOUS CULTURE WITHIN THE LAST 5 DAYS. Performed at [facility], [place] [address], [place], [place] [address]    Report Status 02/02/2018 FINAL  Final  Urine Culture     Status: Abnormal   Collection Time: 01/31/18 12:07 AM  Result Value Ref Range Status   Specimen Description   Final    CYSTOSCOPY URINE Performed at [facility] [address] [place], [place] [address]    Special Requests   Final    NONE Performed at [facility], [place] [address], [place], [place] [address]    Culture (A)  Final    >=100,000 COLONIES/mL ESCHERICHIA COLI Confirmed Extended Spectrum Beta-Lactamase Producer (ESBL).  In bloodstream infections from ESBL organisms, carbapenems are preferred over piperacillin/tazobactam. They are shown to have a lower risk of mortality.    Report Status 02/02/2018 FINAL  Final   Organism ID, Bacteria ESCHERICHIA COLI (A)  Final      Susceptibility   Escherichia coli - MIC*    AMPICILLIN >=32 RESISTANT Resistant     CEFAZOLIN >=64 RESISTANT Resistant     CEFEPIME >=64 RESISTANT Resistant     CEFTAZIDIME 16 INTERMEDIATE Intermediate     CEFTRIAXONE >=64 RESISTANT Resistant     CIPROFLOXACIN >=4 RESISTANT Resistant     GENTAMICIN <=1 SENSITIVE Sensitive     IMIPENEM <=0.25 SENSITIVE Sensitive     TRIMETH/SULFA >=320 RESISTANT Resistant     AMPICILLIN/SULBACTAM >=32 RESISTANT Resistant      PIP/TAZO <=4 SENSITIVE Sensitive     Extended ESBL POSITIVE Resistant     * >=100,000 COLONIES/mL ESCHERICHIA COLI  Urine Culture     Status: Abnormal   Collection Time: 01/31/18  1:37 AM  Result Value Ref Range Status   Specimen Description   Final  URINE, CATHETERIZED Performed at [facility], [place] [address], [place], [place] [address]    Special Requests   Final    NONE Performed at [facility], [place] [address], [place], [place] [address]    Culture (A)  Final    80,000 COLONIES/mL ESCHERICHIA COLI Confirmed Extended Spectrum Beta-Lactamase Producer (ESBL).  In bloodstream infections from ESBL organisms, carbapenems are preferred over piperacillin/tazobactam. They are shown to have a lower risk of mortality.    Report Status 02/02/2018 FINAL  Final   Organism ID, Bacteria ESCHERICHIA COLI (A)  Final      Susceptibility   Escherichia coli - MIC*    AMPICILLIN >=32 RESISTANT Resistant     CEFAZOLIN >=64 RESISTANT Resistant     CEFTRIAXONE >=64 RESISTANT Resistant     CIPROFLOXACIN >=4 RESISTANT Resistant     GENTAMICIN <=1 SENSITIVE Sensitive     IMIPENEM <=0.25 SENSITIVE Sensitive     NITROFURANTOIN <=16 SENSITIVE Sensitive     TRIMETH/SULFA >=320 RESISTANT Resistant     AMPICILLIN/SULBACTAM >=32 RESISTANT Resistant     PIP/TAZO 16 SENSITIVE Sensitive     Extended ESBL POSITIVE Resistant     * 80,000 COLONIES/mL ESCHERICHIA COLI  Culture, blood (routine x 2)     Status: None (Preliminary result)   Collection Time: 02/02/18  9:13 AM  Result Value Ref Range Status   Specimen Description   Final    BLOOD RIGHT ARM Performed at [facility] [address], [place], [place] [address]    Special Requests   Final    BOTTLES DRAWN AEROBIC AND ANAEROBIC Blood Culture adequate volume Performed at [facility] [address] [place], [place] [address]    Culture   Final    NO GROWTH < 12  HOURS Performed at [facility] [address] [place], [place] [address]    Report Status PENDING  Incomplete  Culture, blood (routine x 2)     Status: None (Preliminary result)   Collection Time: 02/02/18  9:13 AM  Result Value Ref Range Status   Specimen Description   Final    BLOOD LEFT HAND Performed at [facility] [address] [place], [place] [address]    Special Requests   Final    BOTTLES DRAWN AEROBIC ONLY Blood Culture adequate volume Performed at [facility] [address] [place], [place] [address]    Culture   Final    NO GROWTH < 12 HOURS Performed at [facility] [address] [place], [place] [address]    Report Status PENDING  Incomplete     Time coordinating discharge: 34  minutes  SIGNED:   Georgette Shell, MD  [facility] 02/03/2018, 10:13 AM Pager   If 7PM-7AM, please contact night-coverage [email or web address] Password [email or web address]

## 2018-02-03 NOTE — H&P (Signed)
History and Physical   [name] KXF:[id number] DOB: 1933-05-13 DOA: 02/03/2018  Referring MD/NP/PA: Dr. Vanita Panda  PCP: [name], MD   Patient coming from: Home  Chief Complaint: Hypoxemia  HPI: [name] is a 84 y.o. female with medical history significant of diabetes, recent hydronephrosis with UTI, atrial fibrillation not on anticoagulation due to falls, abdominal aortic aneurysm, hyperlipidemia, chronic pain syndrome and dementia who was just discharged home today after admission for 4 days in the hospital.  Her final diagnosis was UTI due to ESBL producing E. coli.  She is to be on IV meropenem.  While at home home health came to see patient and was found to have oxygen sats in the 70s to 80s.  She was a little more confused.  Patient was subsequently brought back to the ER.  In the ER she was found to have oxygen sats of 90% on room air but drops with mobility.  She was also found to have worsening right-sided pleural effusion.  Patient is not suspected to have had fluid overload.  She had known history of obstructive sleep apnea but patient was fully awake.  She is not on any home oxygen.  Patient also has left knee pain was unable to walk on the right knee.  She was seen in the ER also to have bedbugs crawling all over the place.  ED Course: Temperature is 99, blood pressure 111/79, pulse 101, respirate of 14, oxygen sat 90% room air.  White count is 9.4, hemoglobin 11, platelets 190.  Sodium 140, potassium 3.8 chloride 101 CO2 32 BUN 29 creatinine 0.94.  Calcium 8.8 and glucose 145.  Urinalysis showed leukocyte Estrace small WBC 21-50 and rare bacteria.  Chest x-ray shows Left-sided pleural effusion with underlying opacity, not significantly changed from previous  Review of Systems: As per HPI otherwise 10 point review of systems negative.    Past Medical History:  Diagnosis Date  . [name] (abdominal aortic aneurysm) ([facility]) 09/2013   3.6 cm  . Atrial fibrillation ([facility])   .  Dementia ([facility])   . Diabetes mellitus    pre-diabetes  . Diverticulosis   . Hyperlipidemia   . Hypertension   . Nephrolithiasis   . OSA on CPAP   . Osteoarthritis   . Osteopenia    T=  -2.1 in hip  . Rheumatoid arthritis(714.0)   . S/P carpal tunnel release   . Spinal stenosis     Past Surgical History:  Procedure Laterality Date  . APPENDECTOMY    . CARPAL TUNNEL RELEASE  12/27/2006   Right subcutaneous ulnar nerve transfer -- Right open carpal tunnel release  . CHOLECYSTECTOMY    . CYSTOSCOPY W/ URETERAL STENT PLACEMENT Right 01/30/2018   Procedure: CYSTOSCOPY WITH STENT REPLACEMENT retrograde pylegram;  Surgeon: Ardis Hughs, MD;  Location: [facility];  Service: Urology;  Laterality: Right;  . FOOT SURGERY    . JOINT REPLACEMENT     BTKR, RTHR  . TOTAL KNEE ARTHROPLASTY  08/21/2007   Right total knee replacement     reports that she has never smoked. She has never used smokeless tobacco. She reports that she does not drink alcohol or use drugs.  Allergies  Allergen Reactions  . Sulfonamide Derivatives Hives and Rash    Family History  Problem Relation Age of Onset  . Stroke Father   . Coronary artery disease Son        CABG at age 56  . Heart disease Son   .  Heart attack Son   . Stroke Mother   . Hypertension Sister   . Diabetes Brother   . Hypertension Maternal Aunt      Prior to Admission medications   Medication Sig Start Date End Date Taking? Authorizing Provider  aspirin 81 MG chewable tablet Chew 81 mg by mouth daily.   Yes [provider]  atenolol (TENORMIN) 50 MG tablet Take 1 tablet (50 mg total) by mouth daily. 08/24/17  Yes [name], MD  DULoxetine (CYMBALTA) 30 MG capsule Take 90 mg by mouth every morning.  07/29/14  Yes [provider]  ertapenem (INVANZ) IVPB Inject 1 g into the vein daily for 12 days. Indication:  Bacteremia Last Day of Therapy:  02/14/2018 Labs - Once weekly:  CBC/D and BMP, Labs - Every other  week:  ESR and CRP 02/03/18 02/15/18 Yes Georgette Shell, MD  gabapentin (NEURONTIN) 800 MG tablet TAKE 1 TABLET BY MOUTH EVERY MORNING AND AT LUNCH, 2 TABLETS BY MOUTH EVERY NIGHT AT BEDTIME Patient taking differently: Take 800-1,600 mg by mouth 3 (three) times daily. TAKE 1 TABLET BY MOUTH EVERY MORNING AND AT LUNCH, 2 TABLETS BY MOUTH EVERY NIGHT AT BEDTIME 12/25/17  Yes Hyatt, Max T, DPM  oxymorphone (OPANA) 5 MG tablet Take 1 tablet (5 mg total) by mouth 3 (three) times daily. 09/08/17  Yes Charlynne Cousins, MD  rosuvastatin (CRESTOR) 40 MG tablet TAKE 1 TABLET BY MOUTH EVERY DAY 12/25/17  Yes [name], MD    Physical Exam: Vitals:   02/03/18 1835 02/03/18 2001  BP: 98/70 111/79  Pulse: 99 (!) 101  Resp: 14 14  Temp: 99 F (37.2 C)   TempSrc: Oral   SpO2: 95% 94%      Constitutional: NAD, calm, comfortable Vitals:   02/03/18 1835 02/03/18 2001  BP: 98/70 111/79  Pulse: 99 (!) 101  Resp: 14 14  Temp: 99 F (37.2 C)   TempSrc: Oral   SpO2: 95% 94%    Patient appears unkempt, bedbugs in the room. Eyes: PERRL, lids and conjunctivae normal ENMT: Mucous membranes are moist. Posterior pharynx clear of any exudate or lesions.Normal dentition.  Neck: normal, supple, no masses, no thyromegaly Respiratory: No wheezing, mild crackles. Normal respiratory effort. No accessory muscle use.  Cardiovascular: Regular rate and rhythm, no murmurs / rubs / gallops. No extremity edema. 2+ pedal pulses. No carotid bruits.  Abdomen: no tenderness, no masses palpated. No hepatosplenomegaly. Bowel sounds positive.  Musculoskeletal: no clubbing / cyanosis. No joint deformity upper and lower extremities.  Decreased ROM in the left knee, no contractures. Normal muscle tone.  Skin: no rashes, lesions, ulcers. No induration Neurologic: CN 2-12 grossly intact. Sensation intact, DTR normal. Strength 5/5 in all 4.  Psychiatric: Normal judgment and insight. Alert and oriented x 3. Normal  mood.     Labs on Admission: I have personally reviewed following labs and imaging studies  CBC: Recent Labs  Lab 01/30/18 2052 01/31/18 0124 02/01/18 0525 02/02/18 0531 02/03/18 0522 02/03/18 1952  WBC 13.4* 14.0* 12.7* 7.6 11.0* 9.4  NEUTROABS 11.0* 11.9*  --   --   --  6.4  HGB 13.7 11.6* 11.8* 10.7* 10.9* 11.0*  HCT 42.8 36.8 38.5 35.0* 35.9* 35.1*  MCV 93.2 92.5 94.4 95.1 95.0 93.6  PLT 185 178 187 180 171 379   Basic Metabolic Panel: Recent Labs  Lab 01/30/18 2052 01/31/18 0124 02/02/18 0531 02/03/18 1952  NA 140 140 140 140  K 4.6  4.1 4.2 3.8  CL 99 103 104 101  CO2 _0 32  GLUCOSE 114* 132* 97 145*  BUN _1 CREATININE 1.03* 1.06* 0.98 0.94  CALCIUM 9.2 8.6* 8.6* 8.8*   GFR: Estimated Creatinine Clearance: 47.5 mL/min (by C-G formula based on SCr of 0.94 mg/dL). Liver Function Tests: Recent Labs  Lab 01/30/18 2052 01/31/18 0124 02/03/18 1952  AST _2 ALT _3 ALKPHOS 68 59 56  BILITOT 1.0 0.9 0.3  PROT 7.5 6.5 6.3*  ALBUMIN 3.6 3.1* 2.6*   Recent Labs  Lab 01/30/18 2052  LIPASE 23   No results for input(s): AMMONIA in the last 168 hours. Coagulation Profile: Recent Labs  Lab 01/31/18 0124  INR 1.22   Cardiac Enzymes: Recent Labs  Lab 02/02/18 1418 02/03/18 1952  TROPONINI <0.03 <0.03   BNP (last 3 results) No results for input(s): PROBNP in the last 8760 hours. HbA1C: No results for input(s): HGBA1C in the last 72 hours. CBG: Recent Labs  Lab 01/31/18 0053  GLUCAP 122*   Lipid Profile: No results for input(s): CHOL, HDL, LDLCALC, TRIG, CHOLHDL, LDLDIRECT in the last 72 hours. Thyroid Function Tests: No results for input(s): TSH, T4TOTAL, FREET4, T3FREE, THYROIDAB in the last 72 hours. Anemia Panel: No results for input(s): VITAMINB12, FOLATE, FERRITIN, TIBC, IRON, RETICCTPCT in the last 72 hours. Urine analysis:    Component Value Date/Time   COLORURINE AMBER (A) 02/03/2018 2151   APPEARANCEUR  CLOUDY (A) 02/03/2018 2151   LABSPEC 1.020 02/03/2018 2151   PHURINE 5.0 02/03/2018 2151   GLUCOSEU 50 (A) 02/03/2018 2151   GLUCOSEU NEG mg/dL 03/08/2007 2031   HGBUR LARGE (A) 02/03/2018 2151   HGBUR negative 03/06/2008 0846   BILIRUBINUR NEGATIVE 02/03/2018 2151   KETONESUR 5 (A) 02/03/2018 2151   PROTEINUR 100 (A) 02/03/2018 2151   UROBILINOGEN 0.2 09/20/2013 1535   NITRITE NEGATIVE 02/03/2018 2151   LEUKOCYTESUR SMALL (A) 02/03/2018 2151   Sepsis Labs: _4 (procalcitonin:4,lacticidven:4) ) Recent Results (from the past 240 hour(s))  Blood Culture (routine x 2)     Status: Abnormal   Collection Time: 01/30/18  8:57 PM  Result Value Ref Range Status   Specimen Description   Final    BLOOD RIGHT WRIST Performed at [facility] [address] [place], [place] [address]    Special Requests   Final    BOTTLES DRAWN AEROBIC AND ANAEROBIC Blood Culture adequate volume Performed at [facility] [address], [place], [place] [address]    Culture  Setup Time   Final    GRAM NEGATIVE RODS IN BOTH AEROBIC AND ANAEROBIC BOTTLES CRITICAL RESULT CALLED TO, READ BACK BY AND VERIFIED WITH: Karie Kirks PharmD 13:05 01/31/18 ([email or web address]) Performed at [facility], [address] [place], [place] [address]    Culture (A)  Final    ESCHERICHIA COLI Confirmed Extended Spectrum Beta-Lactamase Producer (ESBL).  In bloodstream infections from ESBL organisms, carbapenems are preferred over piperacillin/tazobactam. They are shown to have a lower risk of mortality.    Report Status 02/02/2018 FINAL  Final   Organism ID, Bacteria ESCHERICHIA COLI  Final      Susceptibility   Escherichia coli - MIC*    AMPICILLIN >=32 RESISTANT Resistant     CEFAZOLIN >=64 RESISTANT Resistant     CEFEPIME >=64 RESISTANT Resistant     CEFTAZIDIME >=64 RESISTANT Resistant     CEFTRIAXONE >=64 RESISTANT Resistant  CIPROFLOXACIN >=4 RESISTANT Resistant     GENTAMICIN <=1  SENSITIVE Sensitive     IMIPENEM <=0.25 SENSITIVE Sensitive     TRIMETH/SULFA >=320 RESISTANT Resistant     AMPICILLIN/SULBACTAM >=32 RESISTANT Resistant     PIP/TAZO 16 SENSITIVE Sensitive     Extended ESBL POSITIVE Resistant     * ESCHERICHIA COLI  Blood Culture ID Panel (Reflexed)     Status: Abnormal   Collection Time: 01/30/18  8:57 PM  Result Value Ref Range Status   Enterococcus species NOT DETECTED NOT DETECTED Final   Listeria monocytogenes NOT DETECTED NOT DETECTED Final   Staphylococcus species NOT DETECTED NOT DETECTED Final   Staphylococcus aureus (BCID) NOT DETECTED NOT DETECTED Final   Streptococcus species NOT DETECTED NOT DETECTED Final   Streptococcus agalactiae NOT DETECTED NOT DETECTED Final   Streptococcus pneumoniae NOT DETECTED NOT DETECTED Final   Streptococcus pyogenes NOT DETECTED NOT DETECTED Final   Acinetobacter baumannii NOT DETECTED NOT DETECTED Final   Enterobacteriaceae species DETECTED (A) NOT DETECTED Final    Comment: Enterobacteriaceae represent a large family of gram-negative bacteria, not a single organism. CRITICAL RESULT CALLED TO, READ BACK BY AND VERIFIED WITH: Karie Kirks PharmD 13:05 01/31/18 (wilsonm)    Enterobacter cloacae complex NOT DETECTED NOT DETECTED Final   Escherichia coli DETECTED (A) NOT DETECTED Final    Comment: CRITICAL RESULT CALLED TO, READ BACK BY AND VERIFIED WITH: Karie Kirks PharmD 13:05 01/31/18 (wilsonm)    Klebsiella oxytoca NOT DETECTED NOT DETECTED Final   Klebsiella pneumoniae NOT DETECTED NOT DETECTED Final   Proteus species NOT DETECTED NOT DETECTED Final   Serratia marcescens NOT DETECTED NOT DETECTED Final   Carbapenem resistance NOT DETECTED NOT DETECTED Final   Haemophilus influenzae NOT DETECTED NOT DETECTED Final   Neisseria meningitidis NOT DETECTED NOT DETECTED Final   Pseudomonas aeruginosa NOT DETECTED NOT DETECTED Final   Candida albicans NOT DETECTED NOT DETECTED Final   Candida glabrata NOT DETECTED  NOT DETECTED Final   Candida krusei NOT DETECTED NOT DETECTED Final   Candida parapsilosis NOT DETECTED NOT DETECTED Final   Candida tropicalis NOT DETECTED NOT DETECTED Final    Comment: Performed at [facility], [place] [address], [place], [place] [address]  Blood Culture (routine x 2)     Status: Abnormal   Collection Time: 01/30/18  9:12 PM  Result Value Ref Range Status   Specimen Description   Final    BLOOD RIGHT ANTECUBITAL Performed at [facility] [address] [place], [place] [address]    Special Requests   Final    BOTTLES DRAWN AEROBIC AND ANAEROBIC Blood Culture adequate volume Performed at [facility] [address], [place], [place] [address]    Culture  Setup Time   Final    GRAM NEGATIVE RODS IN BOTH AEROBIC AND ANAEROBIC BOTTLES CRITICAL VALUE NOTED.  VALUE IS CONSISTENT WITH PREVIOUSLY REPORTED AND CALLED VALUE.    Culture (A)  Final    ESCHERICHIA COLI SUSCEPTIBILITIES PERFORMED ON PREVIOUS CULTURE WITHIN THE LAST 5 DAYS. Performed at [facility], [place] [address], [place], [place] [address]    Report Status 02/02/2018 FINAL  Final  Urine Culture     Status: Abnormal   Collection Time: 01/31/18 12:07 AM  Result Value Ref Range Status   Specimen Description   Final    CYSTOSCOPY URINE Performed at [facility] [address] [place], [place] [address]    Special  Requests   Final    NONE Performed at [facility], [place] [address] [place], [place] [address]    Culture (A)  Final    >=100,000 COLONIES/mL ESCHERICHIA COLI Confirmed Extended Spectrum Beta-Lactamase Producer (ESBL).  In bloodstream infections from ESBL organisms, carbapenems are preferred over piperacillin/tazobactam. They are shown to have a lower risk of mortality.    Report Status 02/02/2018 FINAL  Final   Organism ID, Bacteria ESCHERICHIA COLI (A)  Final      Susceptibility   Escherichia coli -  MIC*    AMPICILLIN >=32 RESISTANT Resistant     CEFAZOLIN >=64 RESISTANT Resistant     CEFEPIME >=64 RESISTANT Resistant     CEFTAZIDIME 16 INTERMEDIATE Intermediate     CEFTRIAXONE >=64 RESISTANT Resistant     CIPROFLOXACIN >=4 RESISTANT Resistant     GENTAMICIN <=1 SENSITIVE Sensitive     IMIPENEM <=0.25 SENSITIVE Sensitive     TRIMETH/SULFA >=320 RESISTANT Resistant     AMPICILLIN/SULBACTAM >=32 RESISTANT Resistant     PIP/TAZO <=4 SENSITIVE Sensitive     Extended ESBL POSITIVE Resistant     * >=100,000 COLONIES/mL ESCHERICHIA COLI  Urine Culture     Status: Abnormal   Collection Time: 01/31/18  1:37 AM  Result Value Ref Range Status   Specimen Description   Final    URINE, CATHETERIZED Performed at [facility] [address] [place], [place] [address]    Special Requests   Final    NONE Performed at [facility], [place] [address], [place], [place] [address]    Culture (A)  Final    80,000 COLONIES/mL ESCHERICHIA COLI Confirmed Extended Spectrum Beta-Lactamase Producer (ESBL).  In bloodstream infections from ESBL organisms, carbapenems are preferred over piperacillin/tazobactam. They are shown to have a lower risk of mortality.    Report Status 02/02/2018 FINAL  Final   Organism ID, Bacteria ESCHERICHIA COLI (A)  Final      Susceptibility   Escherichia coli - MIC*    AMPICILLIN >=32 RESISTANT Resistant     CEFAZOLIN >=64 RESISTANT Resistant     CEFTRIAXONE >=64 RESISTANT Resistant     CIPROFLOXACIN >=4 RESISTANT Resistant     GENTAMICIN <=1 SENSITIVE Sensitive     IMIPENEM <=0.25 SENSITIVE Sensitive     NITROFURANTOIN <=16 SENSITIVE Sensitive     TRIMETH/SULFA >=320 RESISTANT Resistant     AMPICILLIN/SULBACTAM >=32 RESISTANT Resistant     PIP/TAZO 16 SENSITIVE Sensitive     Extended ESBL POSITIVE Resistant     * 80,000 COLONIES/mL ESCHERICHIA COLI  Culture, blood (routine x 2)     Status: None (Preliminary result)   Collection  Time: 02/02/18  9:13 AM  Result Value Ref Range Status   Specimen Description   Final    BLOOD RIGHT ARM Performed at [facility] [address], [place], [place] [address]    Special Requests   Final    BOTTLES DRAWN AEROBIC AND ANAEROBIC Blood Culture adequate volume Performed at [facility] [address] [place], [place] [address]    Culture   Final    NO GROWTH 1 DAY Performed at [facility], [place] [address] [place], [place] [address]    Report Status PENDING  Incomplete  Culture, blood (routine x 2)     Status: None (Preliminary result)   Collection Time: 02/02/18  9:13 AM  Result Value Ref Range Status   Specimen Description   Final  BLOOD LEFT HAND Performed at [facility] [address], [place], [place] [address]    Special Requests   Final    BOTTLES DRAWN AEROBIC ONLY Blood Culture adequate volume Performed at [facility] [address], [place], [place] [address]    Culture   Final    NO GROWTH 1 DAY Performed at [facility], [place] [address] [place], [place] [address]    Report Status PENDING  Incomplete     Radiological Exams on Admission: Dg Chest Port 1 View  Result Date: 02/03/2018 CLINICAL DATA:  Low O2 sats. EXAM: PORTABLE CHEST 1 VIEW COMPARISON:  February 02, 2018 FINDINGS: There is a left-sided pleural effusion with underlying opacity, similar in the interval. The probable tiny layering effusion on the right seen yesterday is not definitely seen today. A right PICC line is in good position. No pneumothorax. Stable cardiomegaly. No other change. IMPRESSION: 1. The right PICC line is in good position. 2. Left-sided pleural effusion with underlying opacity, not significantly changed. Electronically Signed   By: Dorise Bullion III M.D   On: 02/03/2018 19:31   Dg Chest Port 1 View  Result Date: 02/02/2018 CLINICAL DATA:  PICC line placement evaluation. EXAM:  PORTABLE CHEST 1 VIEW COMPARISON:  Sep 04, 2017 FINDINGS: A right PICC line terminates in the central SVC. Stable cardiomegaly. Small bilateral effusions are new. No other interval changes or acute abnormalities. IMPRESSION: 1. The right PICC line terminates in the central SVC. 2. Small bilateral effusions with underlying atelectasis, new in the interval. Electronically Signed   By: Dorise Bullion III M.D   On: 02/02/2018 21:27   Dg Knee Complete 4 Views Left  Result Date: 02/03/2018 CLINICAL DATA:  Left knee pain today. EXAM: LEFT KNEE - COMPLETE 4+ VIEW COMPARISON:  Remote radiographs 12/16/2005 FINDINGS: No fracture or dislocation. Tricompartmental osteoarthritis with peripheral spurring, progressed from remote prior exam. Lateral tibiofemoral joint space narrowing. There is tibiofemoral chondrocalcinosis. Small joint effusion. Advanced vascular calcifications. IMPRESSION: Moderate to advanced tricompartmental osteoarthritis without acute osseous abnormality. Electronically Signed   By: Keith Rake M.D.   On: 02/03/2018 23:27   [place] Ekg Site Rite  Result Date: 02/02/2018 If Site Rite image not attached, placement could not be confirmed due to current cardiac rhythm.   EKG: Independently reviewed.  EKG showed atrial fibrillation with a rate of 100.  Most change from previous  Assessment/Plan Principal Problem:   Hypoxia Active Problems:   Hyperlipidemia   Essential hypertension   Left knee pain   Dementia (HCC)   Chronic a-fib   Infestation by bed bug     #1 acute hypoxemia: Possibly due to fluid overload.  Patient will be admitted and aggressive diuresis will be pursued.  We will also evaluate patient for possible home oxygen.  Patient had echocardiogram in March of this year showed normal EF.  Mild pulmonary hypertension at the time.  We will repeat echocardiogram at this point.  Placed on oxygen.  #2 chronic atrial fibrillation: Rate is controlled.  Patient not a candidate for  anticoagulation due to recurrent falls.  Continue monitoring.  #3 left knee pain: Left knee is swollen and tender with decreased range of motion.  We will get x-ray of the knee 4 views.  Suspected osteoarthritis.  Pain control and PT OT.  #4 hypertension: Blood pressure is stable.  #5 bedbugs infestation: Contact precaution with hygiene.  #6 dementia: Stable with no agitation.  #7 Recent pyelonephritis: Continue  IV meropenem  DVT prophylaxis: Lovenox Code Status: Full code Family Communication: Daughter with the patient in the room Disposition Plan: Home Consults called: None Admission status: Observation  Severity of Illness: The appropriate patient status for this patient is OBSERVATION. Observation status is judged to be reasonable and necessary in order to provide the required intensity of service to ensure the patient's safety. The patient's presenting symptoms, physical exam findings, and initial radiographic and laboratory data in the context of their medical condition is felt to place them at decreased risk for further clinical deterioration. Furthermore, it is anticipated that the patient will be medically stable for discharge from the hospital within 2 midnights of admission. The following factors support the patient status of observation.   " The patient's presenting symptoms include hypoxemia. " The physical exam findings include left knee swelling with mild fluid overload. " The initial radiographic and laboratory data are chest x-ray showed pleural effusion.     Barbette Merino MD [facility] Pager [phone]  If 7PM-7AM, please contact night-coverage [email or web address] Password [email or web address]  02/03/2018, 11:47 PM

## 2018-02-04 ENCOUNTER — Observation Stay (HOSPITAL_COMMUNITY): Payer: Medicare Other

## 2018-02-04 ENCOUNTER — Other Ambulatory Visit: Payer: Self-pay

## 2018-02-04 ENCOUNTER — Encounter (HOSPITAL_COMMUNITY): Payer: Self-pay

## 2018-02-04 DIAGNOSIS — M069 Rheumatoid arthritis, unspecified: Secondary | ICD-10-CM | POA: Diagnosis present

## 2018-02-04 DIAGNOSIS — N132 Hydronephrosis with renal and ureteral calculous obstruction: Secondary | ICD-10-CM | POA: Diagnosis not present

## 2018-02-04 DIAGNOSIS — B9629 Other Escherichia coli [E. coli] as the cause of diseases classified elsewhere: Secondary | ICD-10-CM | POA: Diagnosis not present

## 2018-02-04 DIAGNOSIS — M1990 Unspecified osteoarthritis, unspecified site: Secondary | ICD-10-CM | POA: Diagnosis present

## 2018-02-04 DIAGNOSIS — J9601 Acute respiratory failure with hypoxia: Secondary | ICD-10-CM | POA: Diagnosis not present

## 2018-02-04 DIAGNOSIS — G9340 Encephalopathy, unspecified: Secondary | ICD-10-CM | POA: Diagnosis not present

## 2018-02-04 DIAGNOSIS — R4182 Altered mental status, unspecified: Secondary | ICD-10-CM | POA: Diagnosis not present

## 2018-02-04 DIAGNOSIS — B9620 Unspecified Escherichia coli [E. coli] as the cause of diseases classified elsewhere: Secondary | ICD-10-CM | POA: Diagnosis present

## 2018-02-04 DIAGNOSIS — J9622 Acute and chronic respiratory failure with hypercapnia: Secondary | ICD-10-CM | POA: Diagnosis present

## 2018-02-04 DIAGNOSIS — W19XXXA Unspecified fall, initial encounter: Secondary | ICD-10-CM | POA: Diagnosis not present

## 2018-02-04 DIAGNOSIS — N390 Urinary tract infection, site not specified: Secondary | ICD-10-CM | POA: Diagnosis not present

## 2018-02-04 DIAGNOSIS — R4020 Unspecified coma: Secondary | ICD-10-CM | POA: Diagnosis not present

## 2018-02-04 DIAGNOSIS — R442 Other hallucinations: Secondary | ICD-10-CM | POA: Diagnosis not present

## 2018-02-04 DIAGNOSIS — I482 Chronic atrial fibrillation, unspecified: Secondary | ICD-10-CM | POA: Diagnosis not present

## 2018-02-04 DIAGNOSIS — Z9049 Acquired absence of other specified parts of digestive tract: Secondary | ICD-10-CM | POA: Diagnosis not present

## 2018-02-04 DIAGNOSIS — R410 Disorientation, unspecified: Secondary | ICD-10-CM | POA: Diagnosis not present

## 2018-02-04 DIAGNOSIS — T40605A Adverse effect of unspecified narcotics, initial encounter: Secondary | ICD-10-CM | POA: Diagnosis present

## 2018-02-04 DIAGNOSIS — Z7401 Bed confinement status: Secondary | ICD-10-CM | POA: Diagnosis not present

## 2018-02-04 DIAGNOSIS — M2550 Pain in unspecified joint: Secondary | ICD-10-CM | POA: Diagnosis not present

## 2018-02-04 DIAGNOSIS — M8580 Other specified disorders of bone density and structure, unspecified site: Secondary | ICD-10-CM | POA: Diagnosis present

## 2018-02-04 DIAGNOSIS — R0902 Hypoxemia: Secondary | ICD-10-CM | POA: Diagnosis not present

## 2018-02-04 DIAGNOSIS — I714 Abdominal aortic aneurysm, without rupture: Secondary | ICD-10-CM | POA: Diagnosis present

## 2018-02-04 DIAGNOSIS — J9621 Acute and chronic respiratory failure with hypoxia: Secondary | ICD-10-CM | POA: Diagnosis not present

## 2018-02-04 DIAGNOSIS — M1712 Unilateral primary osteoarthritis, left knee: Secondary | ICD-10-CM | POA: Diagnosis present

## 2018-02-04 DIAGNOSIS — E119 Type 2 diabetes mellitus without complications: Secondary | ICD-10-CM | POA: Diagnosis present

## 2018-02-04 DIAGNOSIS — G894 Chronic pain syndrome: Secondary | ICD-10-CM | POA: Diagnosis not present

## 2018-02-04 DIAGNOSIS — Z1612 Extended spectrum beta lactamase (ESBL) resistance: Secondary | ICD-10-CM | POA: Diagnosis not present

## 2018-02-04 DIAGNOSIS — N136 Pyonephrosis: Secondary | ICD-10-CM | POA: Diagnosis present

## 2018-02-04 DIAGNOSIS — G4733 Obstructive sleep apnea (adult) (pediatric): Secondary | ICD-10-CM | POA: Diagnosis not present

## 2018-02-04 DIAGNOSIS — R0602 Shortness of breath: Secondary | ICD-10-CM | POA: Diagnosis not present

## 2018-02-04 DIAGNOSIS — J90 Pleural effusion, not elsewhere classified: Secondary | ICD-10-CM | POA: Diagnosis not present

## 2018-02-04 DIAGNOSIS — J9811 Atelectasis: Secondary | ICD-10-CM | POA: Diagnosis not present

## 2018-02-04 DIAGNOSIS — G92 Toxic encephalopathy: Secondary | ICD-10-CM | POA: Diagnosis present

## 2018-02-04 DIAGNOSIS — T426X5A Adverse effect of other antiepileptic and sedative-hypnotic drugs, initial encounter: Secondary | ICD-10-CM | POA: Diagnosis present

## 2018-02-04 DIAGNOSIS — F0390 Unspecified dementia without behavioral disturbance: Secondary | ICD-10-CM | POA: Diagnosis not present

## 2018-02-04 DIAGNOSIS — R0689 Other abnormalities of breathing: Secondary | ICD-10-CM | POA: Diagnosis not present

## 2018-02-04 DIAGNOSIS — I272 Pulmonary hypertension, unspecified: Secondary | ICD-10-CM | POA: Diagnosis present

## 2018-02-04 DIAGNOSIS — I10 Essential (primary) hypertension: Secondary | ICD-10-CM | POA: Diagnosis not present

## 2018-02-04 DIAGNOSIS — T829XXA Unspecified complication of cardiac and vascular prosthetic device, implant and graft, initial encounter: Secondary | ICD-10-CM | POA: Diagnosis not present

## 2018-02-04 DIAGNOSIS — B888 Other specified infestations: Secondary | ICD-10-CM | POA: Diagnosis not present

## 2018-02-04 DIAGNOSIS — E785 Hyperlipidemia, unspecified: Secondary | ICD-10-CM | POA: Diagnosis not present

## 2018-02-04 LAB — COMPREHENSIVE METABOLIC PANEL
ALT: 11 U/L (ref 0–44)
AST: 16 U/L (ref 15–41)
Albumin: 2.5 g/dL — ABNORMAL LOW (ref 3.5–5.0)
Alkaline Phosphatase: 57 U/L (ref 38–126)
Anion gap: 8 (ref 5–15)
BUN: 20 mg/dL (ref 8–23)
CO2: 32 mmol/L (ref 22–32)
Calcium: 8.5 mg/dL — ABNORMAL LOW (ref 8.9–10.3)
Chloride: 100 mmol/L (ref 98–111)
Creatinine, Ser: 0.93 mg/dL (ref 0.44–1.00)
GFR calc Af Amer: >60 mL/min (ref >60)
GFR calc non Af Amer: 56 mL/min — ABNORMAL LOW (ref >60)
Glucose, Bld: 102 mg/dL — ABNORMAL HIGH (ref 70–99)
Potassium: 3.7 mmol/L (ref 3.5–5.1)
Sodium: 140 mmol/L (ref 135–145)
Total Bilirubin: 0.5 mg/dL (ref 0.3–1.2)
Total Protein: 6.2 g/dL — ABNORMAL LOW (ref 6.5–8.1)

## 2018-02-04 LAB — CBC
HCT: 34.3 % — ABNORMAL LOW (ref 36.0–46.0)
Hemoglobin: 10.6 g/dL — ABNORMAL LOW (ref 12.0–15.0)
MCH: 29.2 pg (ref 26.0–34.0)
MCHC: 30.9 g/dL (ref 30.0–36.0)
MCV: 94.5 fL (ref 78.0–100.0)
Platelets: 179 10*3/uL (ref 150–400)
RBC: 3.63 MIL/uL — ABNORMAL LOW (ref 3.87–5.11)
RDW: 13.9 % (ref 11.5–15.5)
WBC: 8.4 10*3/uL (ref 4.0–10.5)

## 2018-02-04 MED ORDER — IOPAMIDOL (ISOVUE-370) INJECTION 76%
100.0000 mL | Freq: Once | INTRAVENOUS | Status: AC | PRN
  Administered 2018-02-04: 100 mL via INTRAVENOUS

## 2018-02-04 MED ORDER — GABAPENTIN 400 MG PO CAPS
800.0000 mg | ORAL_CAPSULE | Freq: Two times a day (BID) | ORAL | Status: DC
  Administered 2018-02-04 – 2018-02-07 (×7): 800 mg via ORAL
  Filled 2018-02-04 (×7): qty 2

## 2018-02-04 MED ORDER — MORPHINE SULFATE 15 MG PO TABS
15.0000 mg | ORAL_TABLET | Freq: Three times a day (TID) | ORAL | Status: DC
  Administered 2018-02-04 – 2018-02-06 (×8): 15 mg via ORAL
  Filled 2018-02-04 (×10): qty 1

## 2018-02-04 MED ORDER — ATENOLOL 50 MG PO TABS
50.0000 mg | ORAL_TABLET | Freq: Every day | ORAL | Status: DC
  Administered 2018-02-04 – 2018-02-09 (×5): 50 mg via ORAL
  Filled 2018-02-04 (×6): qty 1

## 2018-02-04 MED ORDER — ENOXAPARIN SODIUM 40 MG/0.4ML ~~LOC~~ SOLN
40.0000 mg | Freq: Every day | SUBCUTANEOUS | Status: DC
  Administered 2018-02-04 – 2018-02-09 (×6): 40 mg via SUBCUTANEOUS
  Filled 2018-02-04 (×6): qty 0.4

## 2018-02-04 MED ORDER — SODIUM CHLORIDE 0.9 % IV SOLN
1.0000 g | Freq: Every day | INTRAVENOUS | Status: DC
  Administered 2018-02-04 – 2018-02-09 (×6): 1000 mg via INTRAVENOUS
  Filled 2018-02-04 (×6): qty 1

## 2018-02-04 MED ORDER — SODIUM CHLORIDE 0.9% FLUSH: 3.0000 mL | INTRAVENOUS | Status: DC | PRN

## 2018-02-04 MED ORDER — SODIUM CHLORIDE 0.9 % IV SOLN: 250.0000 mL | INTRAVENOUS | Status: DC | PRN

## 2018-02-04 MED ORDER — DULOXETINE HCL 60 MG PO CPEP
90.0000 mg | ORAL_CAPSULE | Freq: Every morning | ORAL | Status: DC
  Administered 2018-02-04 – 2018-02-09 (×5): 90 mg via ORAL
  Filled 2018-02-04 (×6): qty 1

## 2018-02-04 MED ORDER — ERTAPENEM IV (FOR PTA / DISCHARGE USE ONLY): 1.0000 g | INTRAVENOUS | Status: DC

## 2018-02-04 MED ORDER — OXYMORPHONE HCL 5 MG PO TABS: 5.0000 mg | ORAL_TABLET | Freq: Three times a day (TID) | ORAL | Status: DC

## 2018-02-04 MED ORDER — ONDANSETRON HCL 4 MG PO TABS: 4.0000 mg | ORAL_TABLET | Freq: Four times a day (QID) | ORAL | Status: DC | PRN

## 2018-02-04 MED ORDER — FUROSEMIDE 10 MG/ML IJ SOLN
40.0000 mg | Freq: Two times a day (BID) | INTRAMUSCULAR | Status: DC
  Administered 2018-02-04 – 2018-02-05 (×3): 40 mg via INTRAVENOUS
  Filled 2018-02-04 (×3): qty 4

## 2018-02-04 MED ORDER — GABAPENTIN 400 MG PO CAPS
1600.0000 mg | ORAL_CAPSULE | Freq: Every day | ORAL | Status: DC
  Administered 2018-02-04 – 2018-02-07 (×5): 1600 mg via ORAL
  Filled 2018-02-04 (×5): qty 4

## 2018-02-04 MED ORDER — ERTAPENEM IV (FOR PTA / DISCHARGE USE ONLY): 1.0000 g | Freq: Every day | INTRAVENOUS | Status: DC

## 2018-02-04 MED ORDER — SODIUM CHLORIDE 0.9% FLUSH
3.0000 mL | Freq: Two times a day (BID) | INTRAVENOUS | Status: DC
  Administered 2018-02-04 – 2018-02-05 (×2): 3 mL via INTRAVENOUS

## 2018-02-04 MED ORDER — SODIUM CHLORIDE 0.9% FLUSH
10.0000 mL | INTRAVENOUS | Status: DC | PRN
  Administered 2018-02-05 – 2018-02-09 (×2): 10 mL
  Filled 2018-02-04 (×2): qty 40

## 2018-02-04 MED ORDER — IOPAMIDOL (ISOVUE-370) INJECTION 76%
INTRAVENOUS | Status: AC
  Filled 2018-02-04: qty 100

## 2018-02-04 MED ORDER — ASPIRIN 81 MG PO CHEW
81.0000 mg | CHEWABLE_TABLET | Freq: Every day | ORAL | Status: DC
  Administered 2018-02-04 – 2018-02-09 (×6): 81 mg via ORAL
  Filled 2018-02-04 (×6): qty 1

## 2018-02-04 MED ORDER — SODIUM CHLORIDE 0.9% FLUSH
10.0000 mL | Freq: Two times a day (BID) | INTRAVENOUS | Status: DC
  Administered 2018-02-05: 10 mL

## 2018-02-04 MED ORDER — ONDANSETRON HCL 4 MG/2ML IJ SOLN: 4.0000 mg | Freq: Four times a day (QID) | INTRAMUSCULAR | Status: DC | PRN

## 2018-02-04 MED ORDER — ROSUVASTATIN CALCIUM 20 MG PO TABS
40.0000 mg | ORAL_TABLET | Freq: Every day | ORAL | Status: DC
  Administered 2018-02-04 – 2018-02-08 (×4): 40 mg via ORAL
  Filled 2018-02-04 (×4): qty 2

## 2018-02-04 NOTE — Progress Notes (Signed)
Patient states she wears CPAP at HS. Paged [facility] for order.

## 2018-02-04 NOTE — Progress Notes (Signed)
PROGRESS NOTE    LEYLA SOLIZ   DZH:[id number]  DOB: 05/01/1934  DOA: 02/03/2018 PCP: [name], MD   Brief Narrative:  KYNADIE YAUN is a 84 y.o. female with medical history significant of diabetes, recent hydronephrosis with UTI, atrial fibrillation not on anticoagulation due to falls, abdominal aortic aneurysm, hyperlipidemia, chronic pain syndrome and dementia who was just discharged home today after admission for 4 days in the hospital.  Her final diagnosis was UTI due to ESBL producing E. coli.  She is to be on IV meropenem.  While at home,  home health came to see patient and was found to have oxygen sats in the 70s to 80s.  She was a little more confused.  Patient was subsequently brought back to the ER.  In the ER she was found to have oxygen sats of 90% on room air but drops with mobility.  She was also found to have worsening right-sided pleural effusion.   Also noted to have extensive bed bugs.  Subjective: No complaints of dyspnea or cough. No other complaints. ROS: no complaints of nausea, vomiting, constipation diarrhea, cough, dyspnea or dysuria. No other complaints.   Assessment & Plan:   Principal Problem:   Hypoxemic resp failure - may be due to increasing pleural effusion- currently on Lasix- will repeat CXR tomorrow - start incentive spirometry  Active Problems: ESBL E coli UTI Nephrolithiasis and Hydronephrosis - cont Meropenam until 10/16- the patient has a PICC line  CT the abdomen and pelvis showed right proximal ureteral 6 mm obstructing stone with associated right hydroureteronephrosis and perinephric and ureteral inflammation and edema - she is s/p right ureteral stent placement    Chronic a-fib - cont Atenolol- not on AC due to being a fall risk    Hyperlipidemia Crestor    Essential hypertension - Atenolol     Dementia  - oriented only to person- lives with son    Infestation by bed bug - discussed with family to have home  cleaned  Sleep apnea  - on CPAP  DVT prophylaxis: Lovenox Code Status: Full code Family Communication: daughter Disposition Plan:  To be determined Consultants:   none Procedures:   none Antimicrobials:  Anti-infectives (From admission, onward)   Start     Dose/Rate Route Frequency Ordered Stop   02/04/18 0230  ertapenem [facility]) IVPB  Status:  Discontinued    Note to Pharmacy:  Indication:  Bacteremia Last Day of Therapy:  02/14/2018 Labs - Once weekly:  CBC/D and BMP, Labs - Every other week:  ESR and CRP     1 g Intravenous Daily 02/04/18 0103 02/04/18 0105   02/04/18 0200  ertapenem (INVANZ) 1,000 mg in sodium chloride 0.9 % 100 mL IVPB     1 g 200 mL/hr over 30 Minutes Intravenous Daily 02/04/18 0106     02/04/18 0015  ertapenem [facility]) IVPB  Status:  Discontinued    Note to Pharmacy:  Indication:  Bacteremia Last Day of Therapy:  02/14/2018 Labs - Once weekly:  CBC/D and BMP, Labs - Every other week:  ESR and CRP     1 g Intravenous Every 24 hours 02/04/18 0000 02/04/18 0101       Objective: Vitals:   02/03/18 2001 02/04/18 0002 02/04/18 0002 02/04/18 0515  BP: 111/79 119/80 119/80 98/72  Pulse: (!) 101 92 92 93  Resp: _0 Temp:  98.4 F (36.9 C) 98.4 F (36.9 C) (!) 97.4 F (36.3 C)  TempSrc:  Oral Oral Oral  SpO2: 94%  99% 99%  Weight:  79.9 kg    Height:  _0  (1.626 m)      Intake/Output Summary (Last 24 hours) at 02/04/2018 1012 Last data filed at 02/04/2018 0303 Gross per 24 hour  Intake 100 ml  Output -  Net 100 ml   Filed Weights   02/04/18 0002  Weight: 79.9 kg    Examination: General exam: Appears comfortable  HEENT: PERRLA, oral mucosa moist, no sclera icterus or thrush Respiratory system: Clear to auscultation. Respiratory effort normal. Cardiovascular system: S1 & S2 heard, RRR.   Gastrointestinal system: Abdomen soft, non-tender, nondistended. Normal bowel sound. No organomegaly Central nervous system: Alert and  oriented. No focal neurological deficits. Extremities: No cyanosis, clubbing or edema Skin: No rashes or ulcers Psychiatry:  Mood & affect appropriate.     Data Reviewed: I have personally reviewed following labs and imaging studies  CBC: Recent Labs  Lab 01/30/18 2052 01/31/18 0124 02/01/18 0525 02/02/18 0531 02/03/18 0522 02/03/18 1952 02/04/18 0812  WBC 13.4* 14.0* 12.7* 7.6 11.0* 9.4 8.4  NEUTROABS 11.0* 11.9*  --   --   --  6.4  --   HGB 13.7 11.6* 11.8* 10.7* 10.9* 11.0* 10.6*  HCT 42.8 36.8 38.5 35.0* 35.9* 35.1* 34.3*  MCV 93.2 92.5 94.4 95.1 95.0 93.6 94.5  PLT 185 178 187 180 171 190 878   Basic Metabolic Panel: Recent Labs  Lab 01/30/18 2052 01/31/18 0124 02/02/18 0531 02/03/18 1952  NA 140 140 140 140  K 4.6 4.1 4.2 3.8  CL 99 103 104 101  CO2 _1 32  GLUCOSE 114* 132* 97 145*  BUN _2 CREATININE 1.03* 1.06* 0.98 0.94  CALCIUM 9.2 8.6* 8.6* 8.8*   GFR: Estimated Creatinine Clearance: 47.2 mL/min (by C-G formula based on SCr of 0.94 mg/dL). Liver Function Tests: Recent Labs  Lab 01/30/18 2052 01/31/18 0124 02/03/18 1952  AST _3 ALT _4 ALKPHOS 68 59 56  BILITOT 1.0 0.9 0.3  PROT 7.5 6.5 6.3*  ALBUMIN 3.6 3.1* 2.6*   Recent Labs  Lab 01/30/18 2052  LIPASE 23   No results for input(s): AMMONIA in the last 168 hours. Coagulation Profile: Recent Labs  Lab 01/31/18 0124  INR 1.22   Cardiac Enzymes: Recent Labs  Lab 02/02/18 1418 02/03/18 1952  TROPONINI <0.03 <0.03   BNP (last 3 results) No results for input(s): PROBNP in the last 8760 hours. HbA1C: No results for input(s): HGBA1C in the last 72 hours. CBG: Recent Labs  Lab 01/31/18 0053  GLUCAP 122*   Lipid Profile: No results for input(s): CHOL, HDL, LDLCALC, TRIG, CHOLHDL, LDLDIRECT in the last 72 hours. Thyroid Function Tests: No results for input(s): TSH, T4TOTAL, FREET4, T3FREE, THYROIDAB in the last 72 hours. Anemia Panel: No results for  input(s): VITAMINB12, FOLATE, FERRITIN, TIBC, IRON, RETICCTPCT in the last 72 hours. Urine analysis:    Component Value Date/Time   COLORURINE AMBER (A) 02/03/2018 2151   APPEARANCEUR CLOUDY (A) 02/03/2018 2151   LABSPEC 1.020 02/03/2018 2151   PHURINE 5.0 02/03/2018 2151   GLUCOSEU 50 (A) 02/03/2018 2151   GLUCOSEU NEG mg/dL 03/08/2007 2031   HGBUR LARGE (A) 02/03/2018 2151   HGBUR negative 03/06/2008 0846   BILIRUBINUR NEGATIVE 02/03/2018 2151   KETONESUR 5 (A) 02/03/2018 2151   PROTEINUR 100 (A) 02/03/2018 2151   UROBILINOGEN 0.2 09/20/2013 1535   NITRITE NEGATIVE  02/03/2018 2151   LEUKOCYTESUR SMALL (A) 02/03/2018 2151   Sepsis Labs: _0 (procalcitonin:4,lacticidven:4) ) Recent Results (from the past 240 hour(s))  Blood Culture (routine x 2)     Status: Abnormal   Collection Time: 01/30/18  8:57 PM  Result Value Ref Range Status   Specimen Description   Final    BLOOD RIGHT WRIST Performed at [facility], [place] [address] [place], [place] [address]    Special Requests   Final    BOTTLES DRAWN AEROBIC AND ANAEROBIC Blood Culture adequate volume Performed at [facility] [address], [place], [place] [address]    Culture  Setup Time   Final    GRAM NEGATIVE RODS IN BOTH AEROBIC AND ANAEROBIC BOTTLES CRITICAL RESULT CALLED TO, READ BACK BY AND VERIFIED WITH: Karie Kirks PharmD 13:05 01/31/18 ([email or web address]) Performed at [facility], [address], [place], [place] [address]    Culture (A)  Final    ESCHERICHIA COLI Confirmed Extended Spectrum Beta-Lactamase Producer (ESBL).  In bloodstream infections from ESBL organisms, carbapenems are preferred over piperacillin/tazobactam. They are shown to have a lower risk of mortality.    Report Status 02/02/2018 FINAL  Final   Organism ID, Bacteria ESCHERICHIA COLI  Final      Susceptibility   Escherichia coli - MIC*    AMPICILLIN >=32 RESISTANT Resistant     CEFAZOLIN >=64 RESISTANT Resistant      CEFEPIME >=64 RESISTANT Resistant     CEFTAZIDIME >=64 RESISTANT Resistant     CEFTRIAXONE >=64 RESISTANT Resistant     CIPROFLOXACIN >=4 RESISTANT Resistant     GENTAMICIN <=1 SENSITIVE Sensitive     IMIPENEM <=0.25 SENSITIVE Sensitive     TRIMETH/SULFA >=320 RESISTANT Resistant     AMPICILLIN/SULBACTAM >=32 RESISTANT Resistant     PIP/TAZO 16 SENSITIVE Sensitive     Extended ESBL POSITIVE Resistant     * ESCHERICHIA COLI  Blood Culture ID Panel (Reflexed)     Status: Abnormal   Collection Time: 01/30/18  8:57 PM  Result Value Ref Range Status   Enterococcus species NOT DETECTED NOT DETECTED Final   Listeria monocytogenes NOT DETECTED NOT DETECTED Final   Staphylococcus species NOT DETECTED NOT DETECTED Final   Staphylococcus aureus (BCID) NOT DETECTED NOT DETECTED Final   Streptococcus species NOT DETECTED NOT DETECTED Final   Streptococcus agalactiae NOT DETECTED NOT DETECTED Final   Streptococcus pneumoniae NOT DETECTED NOT DETECTED Final   Streptococcus pyogenes NOT DETECTED NOT DETECTED Final   Acinetobacter baumannii NOT DETECTED NOT DETECTED Final   Enterobacteriaceae species DETECTED (A) NOT DETECTED Final    Comment: Enterobacteriaceae represent a large family of gram-negative bacteria, not a single organism. CRITICAL RESULT CALLED TO, READ BACK BY AND VERIFIED WITH: Karie Kirks PharmD 13:05 01/31/18 (wilsonm)    Enterobacter cloacae complex NOT DETECTED NOT DETECTED Final   Escherichia coli DETECTED (A) NOT DETECTED Final    Comment: CRITICAL RESULT CALLED TO, READ BACK BY AND VERIFIED WITH: Karie Kirks PharmD 13:05 01/31/18 ([email or web address])    Klebsiella oxytoca NOT DETECTED NOT DETECTED Final   Klebsiella pneumoniae NOT DETECTED NOT DETECTED Final   Proteus species NOT DETECTED NOT DETECTED Final   Serratia marcescens NOT DETECTED NOT DETECTED Final   Carbapenem resistance NOT DETECTED NOT DETECTED Final   Haemophilus influenzae NOT DETECTED NOT DETECTED Final   Neisseria  meningitidis NOT DETECTED NOT DETECTED Final   Pseudomonas aeruginosa NOT DETECTED NOT DETECTED Final   Candida albicans NOT DETECTED  NOT DETECTED Final   Candida glabrata NOT DETECTED NOT DETECTED Final   Candida krusei NOT DETECTED NOT DETECTED Final   Candida parapsilosis NOT DETECTED NOT DETECTED Final   Candida tropicalis NOT DETECTED NOT DETECTED Final    Comment: Performed at [facility], [place] [address] [place], [place] [address]  Blood Culture (routine x 2)     Status: Abnormal   Collection Time: 01/30/18  9:12 PM  Result Value Ref Range Status   Specimen Description   Final    BLOOD RIGHT ANTECUBITAL Performed at [facility] [address] [place], [place] [address]    Special Requests   Final    BOTTLES DRAWN AEROBIC AND ANAEROBIC Blood Culture adequate volume Performed at [facility] [address], [place], [place] [address]    Culture  Setup Time   Final    GRAM NEGATIVE RODS IN BOTH AEROBIC AND ANAEROBIC BOTTLES CRITICAL VALUE NOTED.  VALUE IS CONSISTENT WITH PREVIOUSLY REPORTED AND CALLED VALUE.    Culture (A)  Final    ESCHERICHIA COLI SUSCEPTIBILITIES PERFORMED ON PREVIOUS CULTURE WITHIN THE LAST 5 DAYS. Performed at [facility], [place] [address], [place], [place] [address]    Report Status 02/02/2018 FINAL  Final  Urine Culture     Status: Abnormal   Collection Time: 01/31/18 12:07 AM  Result Value Ref Range Status   Specimen Description   Final    CYSTOSCOPY URINE Performed at [facility] [address], [place], [place] [address]    Special Requests   Final    NONE Performed at [facility], [place] [address], [place], [place] [address]    Culture (A)  Final    >=100,000 COLONIES/mL ESCHERICHIA COLI Confirmed Extended Spectrum Beta-Lactamase Producer (ESBL).  In bloodstream infections from ESBL organisms, carbapenems are preferred over piperacillin/tazobactam.  They are shown to have a lower risk of mortality.    Report Status 02/02/2018 FINAL  Final   Organism ID, Bacteria ESCHERICHIA COLI (A)  Final      Susceptibility   Escherichia coli - MIC*    AMPICILLIN >=32 RESISTANT Resistant     CEFAZOLIN >=64 RESISTANT Resistant     CEFEPIME >=64 RESISTANT Resistant     CEFTAZIDIME 16 INTERMEDIATE Intermediate     CEFTRIAXONE >=64 RESISTANT Resistant     CIPROFLOXACIN >=4 RESISTANT Resistant     GENTAMICIN <=1 SENSITIVE Sensitive     IMIPENEM <=0.25 SENSITIVE Sensitive     TRIMETH/SULFA >=320 RESISTANT Resistant     AMPICILLIN/SULBACTAM >=32 RESISTANT Resistant     PIP/TAZO <=4 SENSITIVE Sensitive     Extended ESBL POSITIVE Resistant     * >=100,000 COLONIES/mL ESCHERICHIA COLI  Urine Culture     Status: Abnormal   Collection Time: 01/31/18  1:37 AM  Result Value Ref Range Status   Specimen Description   Final    URINE, CATHETERIZED Performed at [facility] [address] [place], [place] [address]    Special Requests   Final    NONE Performed at [facility], [place] [address] [place], [place] [address]    Culture (A)  Final    80,000 COLONIES/mL ESCHERICHIA COLI Confirmed Extended Spectrum Beta-Lactamase Producer (ESBL).  In bloodstream infections from ESBL organisms, carbapenems are preferred over piperacillin/tazobactam. They are shown to have a lower risk of mortality.    Report Status 02/02/2018 FINAL  Final   Organism ID, Bacteria ESCHERICHIA  COLI (A)  Final      Susceptibility   Escherichia coli - MIC*    AMPICILLIN >=32 RESISTANT Resistant     CEFAZOLIN >=64 RESISTANT Resistant     CEFTRIAXONE >=64 RESISTANT Resistant     CIPROFLOXACIN >=4 RESISTANT Resistant     GENTAMICIN <=1 SENSITIVE Sensitive     IMIPENEM <=0.25 SENSITIVE Sensitive     NITROFURANTOIN <=16 SENSITIVE Sensitive     TRIMETH/SULFA >=320 RESISTANT Resistant     AMPICILLIN/SULBACTAM >=32 RESISTANT Resistant     PIP/TAZO  16 SENSITIVE Sensitive     Extended ESBL POSITIVE Resistant     * 80,000 COLONIES/mL ESCHERICHIA COLI  Culture, blood (routine x 2)     Status: None (Preliminary result)   Collection Time: 02/02/18  9:13 AM  Result Value Ref Range Status   Specimen Description   Final    BLOOD RIGHT ARM Performed at [facility] [address] [place], [place] [address]    Special Requests   Final    BOTTLES DRAWN AEROBIC AND ANAEROBIC Blood Culture adequate volume Performed at [facility] [address], [place], [place] [address]    Culture   Final    NO GROWTH 1 DAY Performed at [facility], [place] [address] [place], [place] [address]    Report Status PENDING  Incomplete  Culture, blood (routine x 2)     Status: None (Preliminary result)   Collection Time: 02/02/18  9:13 AM  Result Value Ref Range Status   Specimen Description   Final    BLOOD LEFT HAND Performed at [facility] [address] [place], [place] [address]    Special Requests   Final    BOTTLES DRAWN AEROBIC ONLY Blood Culture adequate volume Performed at [facility] [address], [place], [place] [address]    Culture   Final    NO GROWTH 1 DAY Performed at [facility], [place] [address], [place], [place] [address]    Report Status PENDING  Incomplete         Radiology Studies: Dg Chest Port 1 View  Result Date: 02/03/2018 CLINICAL DATA:  Low O2 sats. EXAM: PORTABLE CHEST 1 VIEW COMPARISON:  February 02, 2018 FINDINGS: There is a left-sided pleural effusion with underlying opacity, similar in the interval. The probable tiny layering effusion on the right seen yesterday is not definitely seen today. A right PICC line is in good position. No pneumothorax. Stable cardiomegaly. No other change. IMPRESSION: 1. The right PICC line is in good position. 2. Left-sided pleural effusion with underlying opacity, not significantly changed.  Electronically Signed   By: Dorise Bullion III M.D   On: 02/03/2018 19:31   Dg Chest Port 1 View  Result Date: 02/02/2018 CLINICAL DATA:  PICC line placement evaluation. EXAM: PORTABLE CHEST 1 VIEW COMPARISON:  Sep 04, 2017 FINDINGS: A right PICC line terminates in the central SVC. Stable cardiomegaly. Small bilateral effusions are new. No other interval changes or acute abnormalities. IMPRESSION: 1. The right PICC line terminates in the central SVC. 2. Small bilateral effusions with underlying atelectasis, new in the interval. Electronically Signed   By: Dorise Bullion III M.D   On: 02/02/2018 21:27   Dg Knee Complete 4 Views Left  Result Date: 02/03/2018 CLINICAL DATA:  Left knee pain today. EXAM: LEFT KNEE - COMPLETE 4+ VIEW COMPARISON:  Remote radiographs 12/16/2005 FINDINGS: No fracture or dislocation. Tricompartmental osteoarthritis with peripheral  spurring, progressed from remote prior exam. Lateral tibiofemoral joint space narrowing. There is tibiofemoral chondrocalcinosis. Small joint effusion. Advanced vascular calcifications. IMPRESSION: Moderate to advanced tricompartmental osteoarthritis without acute osseous abnormality. Electronically Signed   By: Keith Rake M.D.   On: 02/03/2018 23:27   [place] Ekg Site Rite  Result Date: 02/02/2018 If Site Rite image not attached, placement could not be confirmed due to current cardiac rhythm.     Scheduled Meds: . aspirin  81 mg Oral Daily  . atenolol  50 mg Oral Daily  . DULoxetine  90 mg Oral q morning - 10a  . enoxaparin (LOVENOX) injection  40 mg Subcutaneous Daily  . furosemide  40 mg Intravenous BID  . gabapentin  1,600 mg Oral QHS  . gabapentin  800 mg Oral BID WC  . morphine  15 mg Oral TID  . rosuvastatin  40 mg Oral q1800  . sodium chloride flush  3 mL Intravenous Q12H   Continuous Infusions: . sodium chloride    . ertapenem 1,000 mg (02/04/18 0233)     LOS: 0 days    Time spent in minutes: 35    Debbe Odea,  MD [facility] Pager: [email or web address] Password [email or web address] 02/04/2018, 10:12 AM

## 2018-02-05 ENCOUNTER — Inpatient Hospital Stay (HOSPITAL_COMMUNITY): Payer: Medicare Other

## 2018-02-05 DIAGNOSIS — B888 Other specified infestations: Secondary | ICD-10-CM

## 2018-02-05 DIAGNOSIS — E785 Hyperlipidemia, unspecified: Secondary | ICD-10-CM

## 2018-02-05 DIAGNOSIS — J9601 Acute respiratory failure with hypoxia: Secondary | ICD-10-CM

## 2018-02-05 DIAGNOSIS — I10 Essential (primary) hypertension: Secondary | ICD-10-CM

## 2018-02-05 LAB — BLOOD GAS, ARTERIAL
Acid-Base Excess: 14.2 mmol/L — ABNORMAL HIGH (ref 0.0–2.0)
Bicarbonate: 41.1 mmol/L — ABNORMAL HIGH (ref 20.0–28.0)
Drawn by: 257701
FIO2: 21
O2 Saturation: 86.4 %
Patient temperature: 98.6
pCO2 arterial: 63.2 mmHg — ABNORMAL HIGH (ref 32.0–48.0)
pH, Arterial: 7.429 (ref 7.350–7.450)
pO2, Arterial: 52 mmHg — ABNORMAL LOW (ref 83.0–108.0)

## 2018-02-05 LAB — BASIC METABOLIC PANEL
Anion gap: 13 (ref 5–15)
BUN: 20 mg/dL (ref 8–23)
CO2: 37 mmol/L — ABNORMAL HIGH (ref 22–32)
Calcium: 8.9 mg/dL (ref 8.9–10.3)
Chloride: 89 mmol/L — ABNORMAL LOW (ref 98–111)
Creatinine, Ser: 0.96 mg/dL (ref 0.44–1.00)
GFR calc Af Amer: >60 mL/min (ref >60)
GFR calc non Af Amer: 54 mL/min — ABNORMAL LOW (ref >60)
Glucose, Bld: 109 mg/dL — ABNORMAL HIGH (ref 70–99)
Potassium: 3.4 mmol/L — ABNORMAL LOW (ref 3.5–5.1)
Sodium: 139 mmol/L (ref 135–145)

## 2018-02-05 NOTE — Progress Notes (Signed)
RT spoke with RN to ensure PT is on room air for ABG (MD ordered ABG Priority Routine on room air). RN confirmed that she will place PT on room air (was on 2 lpm [place]- per RN) at 1030. We discussed that PT desats on RA- RT confirmed it would be ok to place PT back on oxygen if sp02 not appropriate. We agreed RT would attempt to wait approximately 15-20 minutes to draw ABG on RA.

## 2018-02-05 NOTE — Evaluation (Signed)
Physical Therapy Evaluation Patient Details Name: [name] MRN: [id number] DOB: 1933-07-10 Today's Date: 02/05/2018   History of Present Illness  84 yo female admitted with hypoxia, E coli UTI. Hx of compression fracture T12, A fib. Recent d/c from [facility].   Clinical Impression  Bed level eval only-limited by lethargy. Pt unable to follow 1 step commands consistently at this time. No family present. Will follow and progress activity once pt is able to participate more. At this time, will recommend SNF.     Follow Up Recommendations SNF    Equipment Recommendations  None recommended by PT    Recommendations for Other Services       Precautions / Restrictions Precautions Precautions: Fall Precaution Comments: patient has back brace , stopped using a few weeks ago. Restrictions Weight Bearing Restrictions: No      Mobility  Bed Mobility               General bed mobility comments: NT-too lethargic. Pt not following 1 step commands well.  Transfers                    Ambulation/Gait                Stairs            Wheelchair Mobility    Modified Rankin (Stroke Patients Only)       Balance                                             Pertinent Vitals/Pain Pain Assessment: Faces Faces Pain Scale: Hurts even more Pain Location: L UE with movement Pain Descriptors / Indicators: Grimacing;Discomfort Pain Intervention(s): Limited activity within patient's tolerance    Home Living Family/patient expects to be discharged to:: Unsure Living Arrangements: Children Available Help at Discharge: Family;Available PRN/intermittently Type of Home: House Home Access: Stairs to enter   Entrance Stairs-Number of Steps: 2 Home Layout: Able to live on main level with bedroom/bathroom Home Equipment: Walker - 4 wheels;Bedside commode;Hospital bed;Wheelchair - manual Additional Comments: lives with 2 sons    Prior Function Level  of Independence: Needs assistance   Gait / Transfers Assistance Needed: uses rollator, has assist for all mobility, requires  lots of assist on steps.  ADL's / Homemaking Assistance Needed: son cooks; son assists with dressing, daughter assists with bathing  Comments: help to step over tub; wears gown which she can put on herself     Hand Dominance        Extremity/Trunk Assessment   Upper Extremity Assessment Upper Extremity Assessment: RUE deficits/detail;LUE deficits/detail;Generalized weakness;Difficult to assess due to impaired cognition RUE Deficits / Details: good grip.  LUE Deficits / Details: good grip. Pt grimaced and c/o pain when L UE moved/elevated    Lower Extremity Assessment Lower Extremity Assessment: Difficult to assess due to impaired cognition       Communication   Communication: No difficulties  Cognition Arousal/Alertness: Lethargic Behavior During Therapy: Flat affect Overall Cognitive Status: Impaired/Different from baseline                                        General Comments      Exercises     Assessment/Plan    PT Assessment Patient needs continued  PT services  PT Problem List Decreased strength;Decreased mobility;Decreased balance;Decreased activity tolerance;Decreased cognition;Decreased safety awareness;Decreased knowledge of use of DME;Pain       PT Treatment Interventions DME instruction;Gait training;Therapeutic activities;Therapeutic exercise;Patient/family education;Functional mobility training;Balance training    PT Goals (Current goals can be found in the Care Plan section)  Acute Rehab PT Goals Patient Stated Goal: none stated PT Goal Formulation: Patient unable to participate in goal setting Time For Goal Achievement: 02/19/18 Potential to Achieve Goals: Good    Frequency Min 3X/week   Barriers to discharge        Co-evaluation               AM-PAC PT "6 Clicks" Daily Activity  Outcome  Measure Difficulty turning over in bed (including adjusting bedclothes, sheets and blankets)?: Unable Difficulty moving from lying on back to sitting on the side of the bed? : Unable Difficulty sitting down on and standing up from a chair with arms (e.g., wheelchair, bedside commode, etc,.)?: Unable Help needed moving to and from a bed to chair (including a wheelchair)?: Total Help needed walking in hospital room?: Total Help needed climbing 3-5 steps with a railing? : Total 6 Click Score: 6    End of Session   Activity Tolerance: Patient limited by lethargy Patient left: in bed;with bed alarm set;with call bell/phone within reach   PT Visit Diagnosis: Muscle weakness (generalized) (M62.81);Difficulty in walking, not elsewhere classified (R26.2);History of falling (Z91.81)    Time: 4360-6770 PT Time Calculation (min) (ACUTE ONLY): 10 min   Charges:   PT Evaluation $PT Eval Moderate Complexity: [place], PT Acute Rehabilitation Services Pager: [phone] Office: [phone]

## 2018-02-05 NOTE — Progress Notes (Signed)
PROGRESS NOTE    [name]   YWV:[id number]  DOB: 08-10-1933  DOA: 02/03/2018 PCP: [name], MD   Brief Narrative:  [name] is a 84 y.o. female with medical history significant of diabetes, recent hydronephrosis with UTI, atrial fibrillation not on anticoagulation due to falls, abdominal aortic aneurysm, hyperlipidemia, chronic pain syndrome and dementia who was just discharged home today after admission for 4 days in the hospital.  Her final diagnosis was UTI due to ESBL producing E. coli.  She is to be on IV meropenem.  While at home,  home health came to see patient and was found to have oxygen sats in the 70s to 80s.  She was a little more confused.  Patient was subsequently brought back to the ER.  In the ER she was found to have oxygen sats of 90% on room air but drops with mobility.  She was also found to have worsening right-sided pleural effusion.   Also noted to have extensive bed bugs.  Subjective: No complaints of dyspnea or cough. No other complaints. ROS: no complaints of nausea, vomiting, constipation diarrhea, cough, dyspnea or dysuria. No other complaints.   Assessment & Plan:   Principal Problem:   Hypoxemic resp failure - may be due to increasing pleural effusion- currently on Lasix- will repeat CXR tomorrow - start incentive spirometry - has diuresed well and is in negative balance by 3.4 L - pulse ox is still 70s on room air today and PO2 is 52 - CXR shows a right sided pleural effusion again without improvement-  - CT scan however showed that these effusions were minimal yesterday- I will stop the Lasix today as I feel she is clinically dehydrated- - repeat ECHO is pending   Active Problems: ESBL E coli UTI Nephrolithiasis and Hydronephrosis - cont Meropenam until 10/16- the patient has a PICC line  CT the abdomen and pelvis showed right proximal ureteral 6 mm obstructing stone with associated right hydroureteronephrosis and perinephric and  ureteral inflammation and edema - she is s/p right ureteral stent placement    Chronic a-fib - cont Atenolol- not on AC due to being a fall risk    Hyperlipidemia Crestor    Essential hypertension - Atenolol     Dementia  - oriented only to person- lives with son    Infestation by bed bug - discussed with family to have home cleaned  Sleep apnea  - on CPAP  DVT prophylaxis: Lovenox Code Status: Full code Family Communication: daughter and son Disposition Plan:  To be determined- home when bed bugs adequately treated Consultants:   none Procedures:   none Antimicrobials:  Anti-infectives (From admission, onward)   Start     Dose/Rate Route Frequency Ordered Stop   02/04/18 0230  ertapenem [facility]) IVPB  Status:  Discontinued    Note to Pharmacy:  Indication:  Bacteremia Last Day of Therapy:  02/14/2018 Labs - Once weekly:  CBC/D and BMP, Labs - Every other week:  ESR and CRP     1 g Intravenous Daily 02/04/18 0103 02/04/18 0105   02/04/18 0200  ertapenem (INVANZ) 1,000 mg in sodium chloride 0.9 % 100 mL IVPB     1 g 200 mL/hr over 30 Minutes Intravenous Daily 02/04/18 0106     02/04/18 0015  ertapenem [facility]) IVPB  Status:  Discontinued    Note to Pharmacy:  Indication:  Bacteremia Last Day of Therapy:  02/14/2018 Labs - Once weekly:  CBC/D and BMP,  Labs - Every other week:  ESR and CRP     1 g Intravenous Every 24 hours 02/04/18 0000 02/04/18 0101       Objective: Vitals:   02/04/18 0515 02/04/18 1334 02/04/18 2119 02/05/18 0623  BP: 98/72 101/67 109/80 110/81  Pulse: 93 94 97 (!) 108  Resp: 14 18 18 18   Temp: (!) 97.4 F (36.3 C) 98.2 F (36.8 C) 98 F (36.7 C) 98.8 F (37.1 C)  TempSrc: Oral Oral Oral Oral  SpO2: 99% 100% 98% 98%  Weight:      Height:        Intake/Output Summary (Last 24 hours) at 02/05/2018 1103 Last data filed at 02/05/2018 1019 Gross per 24 hour  Intake 363 ml  Output 4000 ml  Net -3637 ml   Filed Weights   02/04/18  0002  Weight: 79.9 kg    Examination: General exam: Appears comfortable  HEENT: PERRLA, oral mucosa moist, no sclera icterus or thrush Respiratory system: Clear to auscultation. Respiratory effort normal. Cardiovascular system: S1 & S2 heard, RRR.   Gastrointestinal system: Abdomen soft, non-tender, nondistended. Normal bowel sound. No organomegaly Central nervous system: Alert and oriented. No focal neurological deficits. Extremities: No cyanosis, clubbing or edema Skin: No rashes or ulcers Psychiatry:  Mood & affect appropriate.     Data Reviewed: I have personally reviewed following labs and imaging studies  CBC: Recent Labs  Lab 01/30/18 2052 01/31/18 0124 02/01/18 0525 02/02/18 0531 02/03/18 0522 02/03/18 1952 02/04/18 0812  WBC 13.4* 14.0* 12.7* 7.6 11.0* 9.4 8.4  NEUTROABS 11.0* 11.9*  --   --   --  6.4  --   HGB 13.7 11.6* 11.8* 10.7* 10.9* 11.0* 10.6*  HCT 42.8 36.8 38.5 35.0* 35.9* 35.1* 34.3*  MCV 93.2 92.5 94.4 95.1 95.0 93.6 94.5  PLT 185 178 187 180 171 190 767   Basic Metabolic Panel: Recent Labs  Lab 01/31/18 0124 02/02/18 0531 02/03/18 1952 02/04/18 0812 02/05/18 0807  NA 140 140 140 140 139  K 4.1 4.2 3.8 3.7 3.4*  CL 103 104 101 100 89*  CO2 26 27 32 32 37*  GLUCOSE 132* 97 145* 102* 109*  BUN 18 19 20 20 20   CREATININE 1.06* 0.98 0.94 0.93 0.96  CALCIUM 8.6* 8.6* 8.8* 8.5* 8.9   GFR: Estimated Creatinine Clearance: 46.2 mL/min (by C-G formula based on SCr of 0.96 mg/dL). Liver Function Tests: Recent Labs  Lab 01/30/18 2052 01/31/18 0124 02/03/18 1952 02/04/18 0812  AST 22 19 16 16   ALT 13 13 9 11   ALKPHOS 68 59 56 57  BILITOT 1.0 0.9 0.3 0.5  PROT 7.5 6.5 6.3* 6.2*  ALBUMIN 3.6 3.1* 2.6* 2.5*   Recent Labs  Lab 01/30/18 2052  LIPASE 23   No results for input(s): AMMONIA in the last 168 hours. Coagulation Profile: Recent Labs  Lab 01/31/18 0124  INR 1.22   Cardiac Enzymes: Recent Labs  Lab 02/02/18 1418  02/03/18 1952  TROPONINI <0.03 <0.03   BNP (last 3 results) No results for input(s): PROBNP in the last 8760 hours. HbA1C: No results for input(s): HGBA1C in the last 72 hours. CBG: Recent Labs  Lab 01/31/18 0053  GLUCAP 122*   Lipid Profile: No results for input(s): CHOL, HDL, LDLCALC, TRIG, CHOLHDL, LDLDIRECT in the last 72 hours. Thyroid Function Tests: No results for input(s): TSH, T4TOTAL, FREET4, T3FREE, THYROIDAB in the last 72 hours. Anemia Panel: No results for input(s): VITAMINB12, FOLATE, FERRITIN, TIBC, IRON, RETICCTPCT in  the last 72 hours. Urine analysis:    Component Value Date/Time   COLORURINE AMBER (A) 02/03/2018 2151   APPEARANCEUR CLOUDY (A) 02/03/2018 2151   LABSPEC 1.020 02/03/2018 2151   PHURINE 5.0 02/03/2018 2151   GLUCOSEU 50 (A) 02/03/2018 2151   GLUCOSEU NEG mg/dL 03/08/2007 2031   HGBUR LARGE (A) 02/03/2018 2151   HGBUR negative 03/06/2008 0846   BILIRUBINUR NEGATIVE 02/03/2018 2151   KETONESUR 5 (A) 02/03/2018 2151   PROTEINUR 100 (A) 02/03/2018 2151   UROBILINOGEN 0.2 09/20/2013 1535   NITRITE NEGATIVE 02/03/2018 2151   LEUKOCYTESUR SMALL (A) 02/03/2018 2151   Sepsis Labs: @LABRCNTIP (procalcitonin:4,lacticidven:4) ) Recent Results (from the past 240 hour(s))  Blood Culture (routine x 2)     Status: Abnormal   Collection Time: 01/30/18  8:57 PM  Result Value Ref Range Status   Specimen Description   Final    BLOOD RIGHT WRIST Performed at [place] [address] [place], [place] [address]    Special Requests   Final    BOTTLES DRAWN AEROBIC AND ANAEROBIC Blood Culture adequate volume Performed at [facility] [address], [place], [place] [address]    Culture  Setup Time   Final    GRAM NEGATIVE RODS IN BOTH AEROBIC AND ANAEROBIC BOTTLES CRITICAL RESULT CALLED TO, READ BACK BY AND VERIFIED WITH: Karie Kirks PharmD 13:05 01/31/18 ([email or web address]) Performed at [facility], [address],  [place], [place] [address]    Culture (A)  Final    ESCHERICHIA COLI Confirmed Extended Spectrum Beta-Lactamase Producer (ESBL).  In bloodstream infections from ESBL organisms, carbapenems are preferred over piperacillin/tazobactam. They are shown to have a lower risk of mortality.    Report Status 02/02/2018 FINAL  Final   Organism ID, Bacteria ESCHERICHIA COLI  Final      Susceptibility   Escherichia coli - MIC*    AMPICILLIN >=32 RESISTANT Resistant     CEFAZOLIN >=64 RESISTANT Resistant     CEFEPIME >=64 RESISTANT Resistant     CEFTAZIDIME >=64 RESISTANT Resistant     CEFTRIAXONE >=64 RESISTANT Resistant     CIPROFLOXACIN >=4 RESISTANT Resistant     GENTAMICIN <=1 SENSITIVE Sensitive     IMIPENEM <=0.25 SENSITIVE Sensitive     TRIMETH/SULFA >=320 RESISTANT Resistant     AMPICILLIN/SULBACTAM >=32 RESISTANT Resistant     PIP/TAZO 16 SENSITIVE Sensitive     Extended ESBL POSITIVE Resistant     * ESCHERICHIA COLI  Blood Culture ID Panel (Reflexed)     Status: Abnormal   Collection Time: 01/30/18  8:57 PM  Result Value Ref Range Status   Enterococcus species NOT DETECTED NOT DETECTED Final   Listeria monocytogenes NOT DETECTED NOT DETECTED Final   Staphylococcus species NOT DETECTED NOT DETECTED Final   Staphylococcus aureus (BCID) NOT DETECTED NOT DETECTED Final   Streptococcus species NOT DETECTED NOT DETECTED Final   Streptococcus agalactiae NOT DETECTED NOT DETECTED Final   Streptococcus pneumoniae NOT DETECTED NOT DETECTED Final   Streptococcus pyogenes NOT DETECTED NOT DETECTED Final   Acinetobacter baumannii NOT DETECTED NOT DETECTED Final   Enterobacteriaceae species DETECTED (A) NOT DETECTED Final    Comment: Enterobacteriaceae represent a large family of gram-negative bacteria, not a single organism. CRITICAL RESULT CALLED TO, READ BACK BY AND VERIFIED WITH: Karie Kirks PharmD 13:05 01/31/18 (wilsonm)    Enterobacter cloacae complex NOT DETECTED NOT DETECTED Final   Escherichia  coli DETECTED (A) NOT DETECTED Final    Comment:  CRITICAL RESULT CALLED TO, READ BACK BY AND VERIFIED WITH: Karie Kirks PharmD 13:05 01/31/18 (wilsonm)    Klebsiella oxytoca NOT DETECTED NOT DETECTED Final   Klebsiella pneumoniae NOT DETECTED NOT DETECTED Final   Proteus species NOT DETECTED NOT DETECTED Final   Serratia marcescens NOT DETECTED NOT DETECTED Final   Carbapenem resistance NOT DETECTED NOT DETECTED Final   Haemophilus influenzae NOT DETECTED NOT DETECTED Final   Neisseria meningitidis NOT DETECTED NOT DETECTED Final   Pseudomonas aeruginosa NOT DETECTED NOT DETECTED Final   Candida albicans NOT DETECTED NOT DETECTED Final   Candida glabrata NOT DETECTED NOT DETECTED Final   Candida krusei NOT DETECTED NOT DETECTED Final   Candida parapsilosis NOT DETECTED NOT DETECTED Final   Candida tropicalis NOT DETECTED NOT DETECTED Final    Comment: Performed at [facility], [place] [address] [place], [place] [address]  Blood Culture (routine x 2)     Status: Abnormal   Collection Time: 01/30/18  9:12 PM  Result Value Ref Range Status   Specimen Description   Final    BLOOD RIGHT ANTECUBITAL Performed at [facility] [address] [place], [place] [address]    Special Requests   Final    BOTTLES DRAWN AEROBIC AND ANAEROBIC Blood Culture adequate volume Performed at [facility] [address] [place], [place] [address]    Culture  Setup Time   Final    GRAM NEGATIVE RODS IN BOTH AEROBIC AND ANAEROBIC BOTTLES CRITICAL VALUE NOTED.  VALUE IS CONSISTENT WITH PREVIOUSLY REPORTED AND CALLED VALUE.    Culture (A)  Final    ESCHERICHIA COLI SUSCEPTIBILITIES PERFORMED ON PREVIOUS CULTURE WITHIN THE LAST 5 DAYS. Performed at [facility], [place] [address], [place], [place] [address]    Report Status 02/02/2018 FINAL  Final  Urine Culture     Status: Abnormal   Collection Time: 01/31/18 12:07 AM  Result Value Ref Range Status    Specimen Description   Final    CYSTOSCOPY URINE Performed at [place] [address], [place], [place] [address]    Special Requests   Final    NONE Performed at [facility], [place] [address], [place], [place] [address]    Culture (A)  Final    >=100,000 COLONIES/mL ESCHERICHIA COLI Confirmed Extended Spectrum Beta-Lactamase Producer (ESBL).  In bloodstream infections from ESBL organisms, carbapenems are preferred over piperacillin/tazobactam. They are shown to have a lower risk of mortality.    Report Status 02/02/2018 FINAL  Final   Organism ID, Bacteria ESCHERICHIA COLI (A)  Final      Susceptibility   Escherichia coli - MIC*    AMPICILLIN >=32 RESISTANT Resistant     CEFAZOLIN >=64 RESISTANT Resistant     CEFEPIME >=64 RESISTANT Resistant     CEFTAZIDIME 16 INTERMEDIATE Intermediate     CEFTRIAXONE >=64 RESISTANT Resistant     CIPROFLOXACIN >=4 RESISTANT Resistant     GENTAMICIN <=1 SENSITIVE Sensitive     IMIPENEM <=0.25 SENSITIVE Sensitive     TRIMETH/SULFA >=320 RESISTANT Resistant     AMPICILLIN/SULBACTAM >=32 RESISTANT Resistant     PIP/TAZO <=4 SENSITIVE Sensitive     Extended ESBL POSITIVE Resistant     * >=100,000 COLONIES/mL ESCHERICHIA COLI  Urine Culture     Status: Abnormal   Collection Time: 01/31/18  1:37 AM  Result Value Ref Range Status   Specimen Description   Final    URINE, CATHETERIZED  Performed at [facility], [place] [address] [place], [place] [address]    Special Requests   Final    NONE Performed at [facility], [place] [address] [place], [place] [address]    Culture (A)  Final    80,000 COLONIES/mL ESCHERICHIA COLI Confirmed Extended Spectrum Beta-Lactamase Producer (ESBL).  In bloodstream infections from ESBL organisms, carbapenems are preferred over piperacillin/tazobactam. They are shown to have a lower risk of mortality.    Report Status 02/02/2018 FINAL  Final    Organism ID, Bacteria ESCHERICHIA COLI (A)  Final      Susceptibility   Escherichia coli - MIC*    AMPICILLIN >=32 RESISTANT Resistant     CEFAZOLIN >=64 RESISTANT Resistant     CEFTRIAXONE >=64 RESISTANT Resistant     CIPROFLOXACIN >=4 RESISTANT Resistant     GENTAMICIN <=1 SENSITIVE Sensitive     IMIPENEM <=0.25 SENSITIVE Sensitive     NITROFURANTOIN <=16 SENSITIVE Sensitive     TRIMETH/SULFA >=320 RESISTANT Resistant     AMPICILLIN/SULBACTAM >=32 RESISTANT Resistant     PIP/TAZO 16 SENSITIVE Sensitive     Extended ESBL POSITIVE Resistant     * 80,000 COLONIES/mL ESCHERICHIA COLI  Culture, blood (routine x 2)     Status: None (Preliminary result)   Collection Time: 02/02/18  9:13 AM  Result Value Ref Range Status   Specimen Description   Final    BLOOD RIGHT ARM Performed at [facility] [address], [place], [place] [address]    Special Requests   Final    BOTTLES DRAWN AEROBIC AND ANAEROBIC Blood Culture adequate volume Performed at [facility] [address] [place], [place] [address]    Culture   Final    NO GROWTH 3 DAYS Performed at [facility], [place] [address] [place], [place] [address]    Report Status PENDING  Incomplete  Culture, blood (routine x 2)     Status: None (Preliminary result)   Collection Time: 02/02/18  9:13 AM  Result Value Ref Range Status   Specimen Description   Final    BLOOD LEFT HAND Performed at [facility] [address] [place], [place] [address]    Special Requests   Final    BOTTLES DRAWN AEROBIC ONLY Blood Culture adequate volume Performed at [facility] [address], [place], [place] [address]    Culture   Final    NO GROWTH 3 DAYS Performed at [facility], [place] [address] [place], [place] [address]    Report Status PENDING  Incomplete         Radiology Studies: Ct Angio Chest Pe W And/or Wo Contrast  Result Date:  02/04/2018 CLINICAL DATA:  Complex chest pain, intermediate clinical probability for pulmonary embolism, hypoxia, history atrial fibrillation, dementia, diabetes mellitus, hypertension EXAM: CT ANGIOGRAPHY CHEST WITH CONTRAST TECHNIQUE: Multidetector CT imaging of the chest was performed using the standard protocol during bolus administration of intravenous contrast. Multiplanar CT image reconstructions and MIPs were obtained to evaluate the vascular anatomy. CONTRAST:  169m ISOVUE-370 IOPAMIDOL (ISOVUE-370) INJECTION 76% IV COMPARISON:  09/04/2017 FINDINGS: Cardiovascular: Extensive atherosclerotic calcifications aorta, proximal great vessels and coronary arteries. Ascending thoracic aorta upper normal caliber. Pulmonary arteries well opacified and patent. No evidence of pulmonary embolism. Enlargement of cardiac chambers. No pericardial effusion. Mediastinum/Nodes: Esophagus unremarkable. Base of cervical region normal appearance. No thoracic adenopathy. Lungs/Pleura: BILATERAL pleural effusions with atelectasis of the  posterior lower lobes bilaterally. Remaining lungs clear though scattered respiratory motion artifacts are present. Upper Abdomen: Last image demonstrates ectasia of the abdominal aorta, noted on the 09/14/2017 exam, inadequately assessed on this study. Remaining visualized upper abdomen unremarkable. Musculoskeletal: Bones demineralized. Marked compression fracture of T12 vertebral body increased since previous exam. Review of the MIP images confirms the above findings. IMPRESSION: No evidence of pulmonary embolism. Extensive atherosclerotic calcifications of aorta, proximal great vessels and coronary arteries. Small BILATERAL pleural effusions and bibasilar atelectasis. Increased compression fracture of T12 vertebral body. Aortic Atherosclerosis (ICD10-I70.0). Electronically Signed   By: Lavonia Dana M.D.   On: 02/04/2018 13:16   Dg Chest Port 1 View  Result Date: 02/05/2018 CLINICAL DATA:   Hypoxia. EXAM: PORTABLE CHEST 1 VIEW COMPARISON:  Chest CT yesterday. FINDINGS: Right upper extremity PICC tip in the proximal SVC. Again seen cardiomegaly with tortuous atherosclerotic thoracic aorta. Bilateral pleural effusions, unchanged of the left but worsened on the right. Slight vascular congestion. No pulmonary edema or pneumothorax. IMPRESSION: 1. Bilateral pleural effusions, increasing on the right and unchanged on the left. 2. Cardiomegaly with slight vascular congestion. Atherosclerotic tortuous thoracic aorta. Electronically Signed   By: Keith Rake M.D.   On: 02/05/2018 05:21   Dg Chest Port 1 View  Result Date: 02/03/2018 CLINICAL DATA:  Low O2 sats. EXAM: PORTABLE CHEST 1 VIEW COMPARISON:  February 02, 2018 FINDINGS: There is a left-sided pleural effusion with underlying opacity, similar in the interval. The probable tiny layering effusion on the right seen yesterday is not definitely seen today. A right PICC line is in good position. No pneumothorax. Stable cardiomegaly. No other change. IMPRESSION: 1. The right PICC line is in good position. 2. Left-sided pleural effusion with underlying opacity, not significantly changed. Electronically Signed   By: Dorise Bullion III M.D   On: 02/03/2018 19:31   Dg Knee Complete 4 Views Left  Result Date: 02/03/2018 CLINICAL DATA:  Left knee pain today. EXAM: LEFT KNEE - COMPLETE 4+ VIEW COMPARISON:  Remote radiographs 12/16/2005 FINDINGS: No fracture or dislocation. Tricompartmental osteoarthritis with peripheral spurring, progressed from remote prior exam. Lateral tibiofemoral joint space narrowing. There is tibiofemoral chondrocalcinosis. Small joint effusion. Advanced vascular calcifications. IMPRESSION: Moderate to advanced tricompartmental osteoarthritis without acute osseous abnormality. Electronically Signed   By: Keith Rake M.D.   On: 02/03/2018 23:27      Scheduled Meds: . aspirin  81 mg Oral Daily  . atenolol  50 mg Oral Daily   . DULoxetine  90 mg Oral q morning - 10a  . enoxaparin (LOVENOX) injection  40 mg Subcutaneous Daily  . furosemide  40 mg Intravenous BID  . gabapentin  1,600 mg Oral QHS  . gabapentin  800 mg Oral BID WC  . morphine  15 mg Oral TID  . rosuvastatin  40 mg Oral q1800  . sodium chloride flush  10-40 mL Intracatheter Q12H  . sodium chloride flush  3 mL Intravenous Q12H   Continuous Infusions: . sodium chloride    . ertapenem 1,000 mg (02/05/18 0140)     LOS: 1 day    Time spent in minutes: 35    Debbe Odea, MD [facility] Pager: [email or web address] Password [email or web address] 02/05/2018, 11:03 AM

## 2018-02-05 NOTE — Care Management Note (Signed)
Case Management Note  Patient Details  Name: [name] MRN: [id number] Date of Birth: 1933-10-27  Subjective/Objective:  Recent d/c from hospital to [facility] fro [facility] abx,PT/OT/aide/csw. Per [facility] unable to accept back until bed bugs treated. Per son Bryson Dames have washed sheets-informed that since home is a rental they are required to inform landlord-to have home treated-[place] voiced understanding. He feels his mother will better taken care of in a SNF-has gone to [facility] in past prefer there again. PT cons-CSW notified.                  Action/Plan:dc plan SNF.   Expected Discharge Date:  02/05/18               Expected Discharge Plan:  Skilled Nursing Facility  In-House Referral:  Clinical Social Work  Discharge planning Services  CM Consult  Post Acute Care Choice:  Home Health([facility]-HHRN/PT/OT-they will not accept back until bed bugs treated.) Choice offered to:     DME Arranged:    DME Agency:     HH Arranged:    HH Agency:     Status of Service:  In process, will continue to follow  If discussed at Long Length of Stay Meetings, dates discussed:    Additional Comments:  Dessa Phi, RN 02/05/2018, 11:37 AM

## 2018-02-06 ENCOUNTER — Inpatient Hospital Stay (HOSPITAL_COMMUNITY): Payer: Medicare Other

## 2018-02-06 DIAGNOSIS — I10 Essential (primary) hypertension: Secondary | ICD-10-CM

## 2018-02-06 LAB — ECHOCARDIOGRAM COMPLETE
Height: 64 in
Weight: 2818.3606 oz

## 2018-02-06 NOTE — Progress Notes (Signed)
PROGRESS NOTE    [name]   HWK:[id number]  DOB: 21-Sep-1933  DOA: 02/03/2018 PCP: [name], MD   Brief Narrative:  [name] is a 84 y.o. female with medical history significant of diabetes, recent hydronephrosis with UTI, atrial fibrillation not on anticoagulation due to falls, abdominal aortic aneurysm, hyperlipidemia, chronic pain syndrome and dementia who was just discharged home today after admission for 4 days in the hospital.  Her final diagnosis was UTI due to ESBL producing E. coli.  She is to be on IV meropenem.  While at home,  home health came to see patient and was found to have oxygen sats in the 70s to 80s.  She was a little more confused.  Patient was subsequently brought back to the ER.  In the ER she was found to have oxygen sats of 90% on room air but drops with mobility.  She was also found to have worsening right-sided pleural effusion.   Also noted to have extensive bed bugs.  Subjective: No complaints of dyspnea or cough. No other complaints. ROS: no complaints of nausea, vomiting, constipation diarrhea, cough, dyspnea or dysuria. No other complaints.   Assessment & Plan:   Principal Problem:   Hypoxemic resp failure  - initially suspected to be due to increasing pleural effusion- started on Lasix  - has diuresed well and is in negative balance by 3.4 L - pulse ox is still 70s on room air and PO2 is 52 - CXR shows a right sided pleural effusion again without improvement-  - CT scan however showed that these effusions were minimal - I have stoped the Lasix   as I feel she is clinically dehydrated-   - CT also did not reveal and infiltrates or PE that could be resulting in the hypoxia- there is no dyspnea at rest - requiring 3 L O2 at rest - we are still awaiting a 2 D ECHO to determine if she has any new underlying CHF - PT recommends SNF- awaiting SNF bed as well prior to d/c  Active Problems: ESBL E coli UTI Nephrolithiasis and Hydronephrosis -  cont Meropenam until 10/16- the patient has a PICC line  - CT the abdomen and pelvis showed right proximal ureteral 6 mm obstructing stone with associated right hydroureteronephrosis and perinephric and ureteral inflammation and edema - she is s/p right ureteral stent placement and needs outpt f/u with Urology    Chronic a-fib - cont Atenolol- not on AC due to being a fall risk    Hyperlipidemia Crestor    Essential hypertension - Atenolol     Dementia  - oriented only to person- lives with son    Infestation by bed bug - discussed with family to have home cleaned  Sleep apnea  - on CPAP  DVT prophylaxis: Lovenox Code Status: Full code Family Communication: daughter and son Disposition Plan:  To be determined- - PT recommended SNF and patient is agreeable to this today Consultants:   none Procedures:   none Antimicrobials:  Anti-infectives (From admission, onward)   Start     Dose/Rate Route Frequency Ordered Stop   02/04/18 0230  ertapenem [facility] IVPB  Status:  Discontinued    Note to Pharmacy:  Indication:  Bacteremia Last Day of Therapy:  02/14/2018 Labs - Once weekly:  CBC/D and BMP, Labs - Every other week:  ESR and CRP     1 g Intravenous Daily 02/04/18 0103 02/04/18 0105   02/04/18 0200  ertapenem (  INVANZ) 1,000 mg in sodium chloride 0.9 % 100 mL IVPB     1 g 200 mL/hr over 30 Minutes Intravenous Daily 02/04/18 0106     02/04/18 0015  ertapenem [facility]) IVPB  Status:  Discontinued    Note to Pharmacy:  Indication:  Bacteremia Last Day of Therapy:  02/14/2018 Labs - Once weekly:  CBC/D and BMP, Labs - Every other week:  ESR and CRP     1 g Intravenous Every 24 hours 02/04/18 0000 02/04/18 0101       Objective: Vitals:   02/05/18 1249 02/05/18 2110 02/06/18 0644 02/06/18 1049  BP: 110/79 111/73 107/73 110/74  Pulse: 92 83 87 82  Resp: _0 Temp: 98.2 F (36.8 C) 97.8 F (36.6 C) 97.8 F (36.6 C)   TempSrc: Oral Oral    SpO2: 97% 97%      Weight:      Height:        Intake/Output Summary (Last 24 hours) at 02/06/2018 1324 Last data filed at 02/06/2018 0600 Gross per 24 hour  Intake 208.28 ml  Output 400 ml  Net -191.72 ml   Filed Weights   02/04/18 0002  Weight: 79.9 kg    Examination: General exam: Appears comfortable  HEENT: PERRLA, oral mucosa moist, no sclera icterus or thrush Respiratory system: Clear to auscultation. Respiratory effort normal. On 3 L O2 at rest Cardiovascular system: S1 & S2 heard, RRR.   Gastrointestinal system: Abdomen soft, non-tender, nondistended. Normal bowel sound. No organomegaly Central nervous system: Alert and oriented. No focal neurological deficits. Extremities: No cyanosis, clubbing or edema Skin: No rashes or ulcers Psychiatry:  Mood & affect appropriate.     Data Reviewed: I have personally reviewed following labs and imaging studies  CBC: Recent Labs  Lab 01/30/18 2052 01/31/18 0124 02/01/18 0525 02/02/18 0531 02/03/18 0522 02/03/18 1952 02/04/18 0812  WBC 13.4* 14.0* 12.7* 7.6 11.0* 9.4 8.4  NEUTROABS 11.0* 11.9*  --   --   --  6.4  --   HGB 13.7 11.6* 11.8* 10.7* 10.9* 11.0* 10.6*  HCT 42.8 36.8 38.5 35.0* 35.9* 35.1* 34.3*  MCV 93.2 92.5 94.4 95.1 95.0 93.6 94.5  PLT 185 178 187 180 171 190 295   Basic Metabolic Panel: Recent Labs  Lab 01/31/18 0124 02/02/18 0531 02/03/18 1952 02/04/18 0812 02/05/18 0807  NA 140 140 140 140 139  K 4.1 4.2 3.8 3.7 3.4*  CL 103 104 101 100 89*  CO2 26 27 32 32 37*  GLUCOSE 132* 97 145* 102* 109*  BUN _1 CREATININE 1.06* 0.98 0.94 0.93 0.96  CALCIUM 8.6* 8.6* 8.8* 8.5* 8.9   GFR: Estimated Creatinine Clearance: 46.2 mL/min (by C-G formula based on SCr of 0.96 mg/dL). Liver Function Tests: Recent Labs  Lab 01/30/18 2052 01/31/18 0124 02/03/18 1952 02/04/18 0812  AST _2 ALT _3 ALKPHOS 68 59 56 57  BILITOT 1.0 0.9 0.3 0.5  PROT 7.5 6.5 6.3* 6.2*  ALBUMIN 3.6 3.1* 2.6*  2.5*   Recent Labs  Lab 01/30/18 2052  LIPASE 23   No results for input(s): AMMONIA in the last 168 hours. Coagulation Profile: Recent Labs  Lab 01/31/18 0124  INR 1.22   Cardiac Enzymes: Recent Labs  Lab 02/02/18 1418 02/03/18 1952  TROPONINI <0.03 <0.03   BNP (last 3 results) No results for input(s): PROBNP in the last 8760 hours. HbA1C: No results for  input(s): HGBA1C in the last 72 hours. CBG: Recent Labs  Lab 01/31/18 0053  GLUCAP 122*   Lipid Profile: No results for input(s): CHOL, HDL, LDLCALC, TRIG, CHOLHDL, LDLDIRECT in the last 72 hours. Thyroid Function Tests: No results for input(s): TSH, T4TOTAL, FREET4, T3FREE, THYROIDAB in the last 72 hours. Anemia Panel: No results for input(s): VITAMINB12, FOLATE, FERRITIN, TIBC, IRON, RETICCTPCT in the last 72 hours. Urine analysis:    Component Value Date/Time   COLORURINE AMBER (A) 02/03/2018 2151   APPEARANCEUR CLOUDY (A) 02/03/2018 2151   LABSPEC 1.020 02/03/2018 2151   PHURINE 5.0 02/03/2018 2151   GLUCOSEU 50 (A) 02/03/2018 2151   GLUCOSEU NEG mg/dL 03/08/2007 2031   HGBUR LARGE (A) 02/03/2018 2151   HGBUR negative 03/06/2008 0846   BILIRUBINUR NEGATIVE 02/03/2018 2151   KETONESUR 5 (A) 02/03/2018 2151   PROTEINUR 100 (A) 02/03/2018 2151   UROBILINOGEN 0.2 09/20/2013 1535   NITRITE NEGATIVE 02/03/2018 2151   LEUKOCYTESUR SMALL (A) 02/03/2018 2151   Sepsis Labs: _0 (procalcitonin:4,lacticidven:4) ) Recent Results (from the past 240 hour(s))  Blood Culture (routine x 2)     Status: Abnormal   Collection Time: 01/30/18  8:57 PM  Result Value Ref Range Status   Specimen Description   Final    BLOOD RIGHT WRIST Performed at [facility] [address] [place], [place] [address]    Special Requests   Final    BOTTLES DRAWN AEROBIC AND ANAEROBIC Blood Culture adequate volume Performed at [facility] [address] [place], [place] [address]    Culture  Setup  Time   Final    GRAM NEGATIVE RODS IN BOTH AEROBIC AND ANAEROBIC BOTTLES CRITICAL RESULT CALLED TO, READ BACK BY AND VERIFIED WITH: Karie Kirks PharmD 13:05 01/31/18 ([email or web address]) Performed at [facility], [address], [place], [place] [address]    Culture (A)  Final    ESCHERICHIA COLI Confirmed Extended Spectrum Beta-Lactamase Producer (ESBL).  In bloodstream infections from ESBL organisms, carbapenems are preferred over piperacillin/tazobactam. They are shown to have a lower risk of mortality.    Report Status 02/02/2018 FINAL  Final   Organism ID, Bacteria ESCHERICHIA COLI  Final      Susceptibility   Escherichia coli - MIC*    AMPICILLIN >=32 RESISTANT Resistant     CEFAZOLIN >=64 RESISTANT Resistant     CEFEPIME >=64 RESISTANT Resistant     CEFTAZIDIME >=64 RESISTANT Resistant     CEFTRIAXONE >=64 RESISTANT Resistant     CIPROFLOXACIN >=4 RESISTANT Resistant     GENTAMICIN <=1 SENSITIVE Sensitive     IMIPENEM <=0.25 SENSITIVE Sensitive     TRIMETH/SULFA >=320 RESISTANT Resistant     AMPICILLIN/SULBACTAM >=32 RESISTANT Resistant     PIP/TAZO 16 SENSITIVE Sensitive     Extended ESBL POSITIVE Resistant     * ESCHERICHIA COLI  Blood Culture ID Panel (Reflexed)     Status: Abnormal   Collection Time: 01/30/18  8:57 PM  Result Value Ref Range Status   Enterococcus species NOT DETECTED NOT DETECTED Final   Listeria monocytogenes NOT DETECTED NOT DETECTED Final   Staphylococcus species NOT DETECTED NOT DETECTED Final   Staphylococcus aureus (BCID) NOT DETECTED NOT DETECTED Final   Streptococcus species NOT DETECTED NOT DETECTED Final   Streptococcus agalactiae NOT DETECTED NOT DETECTED Final   Streptococcus pneumoniae NOT DETECTED NOT DETECTED Final   Streptococcus pyogenes NOT DETECTED NOT DETECTED Final   Acinetobacter baumannii NOT DETECTED NOT DETECTED Final  Enterobacteriaceae species DETECTED (A) NOT DETECTED Final    Comment: Enterobacteriaceae represent a large family  of gram-negative bacteria, not a single organism. CRITICAL RESULT CALLED TO, READ BACK BY AND VERIFIED WITH: Karie Kirks PharmD 13:05 01/31/18 (wilsonm)    Enterobacter cloacae complex NOT DETECTED NOT DETECTED Final   Escherichia coli DETECTED (A) NOT DETECTED Final    Comment: CRITICAL RESULT CALLED TO, READ BACK BY AND VERIFIED WITH: Karie Kirks PharmD 13:05 01/31/18 (wilsonm)    Klebsiella oxytoca NOT DETECTED NOT DETECTED Final   Klebsiella pneumoniae NOT DETECTED NOT DETECTED Final   Proteus species NOT DETECTED NOT DETECTED Final   Serratia marcescens NOT DETECTED NOT DETECTED Final   Carbapenem resistance NOT DETECTED NOT DETECTED Final   Haemophilus influenzae NOT DETECTED NOT DETECTED Final   Neisseria meningitidis NOT DETECTED NOT DETECTED Final   Pseudomonas aeruginosa NOT DETECTED NOT DETECTED Final   Candida albicans NOT DETECTED NOT DETECTED Final   Candida glabrata NOT DETECTED NOT DETECTED Final   Candida krusei NOT DETECTED NOT DETECTED Final   Candida parapsilosis NOT DETECTED NOT DETECTED Final   Candida tropicalis NOT DETECTED NOT DETECTED Final    Comment: Performed at [facility], [place] [address] [place], [place] [address]  Blood Culture (routine x 2)     Status: Abnormal   Collection Time: 01/30/18  9:12 PM  Result Value Ref Range Status   Specimen Description   Final    BLOOD RIGHT ANTECUBITAL Performed at [facility] [address] [place], [place] [address]    Special Requests   Final    BOTTLES DRAWN AEROBIC AND ANAEROBIC Blood Culture adequate volume Performed at [facility] [address], [place], [place] [address]    Culture  Setup Time   Final    GRAM NEGATIVE RODS IN BOTH AEROBIC AND ANAEROBIC BOTTLES CRITICAL VALUE NOTED.  VALUE IS CONSISTENT WITH PREVIOUSLY REPORTED AND CALLED VALUE.    Culture (A)  Final    ESCHERICHIA COLI SUSCEPTIBILITIES PERFORMED ON PREVIOUS CULTURE WITHIN THE LAST 5  DAYS. Performed at [facility], [place] [address], [place], [place] [address]    Report Status 02/02/2018 FINAL  Final  Urine Culture     Status: Abnormal   Collection Time: 01/31/18 12:07 AM  Result Value Ref Range Status   Specimen Description   Final    CYSTOSCOPY URINE Performed at [facility] [address] [place], [place] [address]    Special Requests   Final    NONE Performed at [facility], [place] [address], [place], [place] [address]    Culture (A)  Final    >=100,000 COLONIES/mL ESCHERICHIA COLI Confirmed Extended Spectrum Beta-Lactamase Producer (ESBL).  In bloodstream infections from ESBL organisms, carbapenems are preferred over piperacillin/tazobactam. They are shown to have a lower risk of mortality.    Report Status 02/02/2018 FINAL  Final   Organism ID, Bacteria ESCHERICHIA COLI (A)  Final      Susceptibility   Escherichia coli - MIC*    AMPICILLIN >=32 RESISTANT Resistant     CEFAZOLIN >=64 RESISTANT Resistant     CEFEPIME >=64 RESISTANT Resistant     CEFTAZIDIME 16 INTERMEDIATE Intermediate     CEFTRIAXONE >=64 RESISTANT Resistant     CIPROFLOXACIN >=4 RESISTANT Resistant     GENTAMICIN <=1 SENSITIVE Sensitive     IMIPENEM <=0.25 SENSITIVE Sensitive     TRIMETH/SULFA >=320 RESISTANT Resistant  AMPICILLIN/SULBACTAM >=32 RESISTANT Resistant     PIP/TAZO <=4 SENSITIVE Sensitive     Extended ESBL POSITIVE Resistant     * >=100,000 COLONIES/mL ESCHERICHIA COLI  Urine Culture     Status: Abnormal   Collection Time: 01/31/18  1:37 AM  Result Value Ref Range Status   Specimen Description   Final    URINE, CATHETERIZED Performed at [facility], [place] [address], [place], [place] [address]    Special Requests   Final    NONE Performed at [facility], [place] [address], [place], [place] [address]    Culture (A)  Final    80,000 COLONIES/mL ESCHERICHIA COLI Confirmed Extended  Spectrum Beta-Lactamase Producer (ESBL).  In bloodstream infections from ESBL organisms, carbapenems are preferred over piperacillin/tazobactam. They are shown to have a lower risk of mortality.    Report Status 02/02/2018 FINAL  Final   Organism ID, Bacteria ESCHERICHIA COLI (A)  Final      Susceptibility   Escherichia coli - MIC*    AMPICILLIN >=32 RESISTANT Resistant     CEFAZOLIN >=64 RESISTANT Resistant     CEFTRIAXONE >=64 RESISTANT Resistant     CIPROFLOXACIN >=4 RESISTANT Resistant     GENTAMICIN <=1 SENSITIVE Sensitive     IMIPENEM <=0.25 SENSITIVE Sensitive     NITROFURANTOIN <=16 SENSITIVE Sensitive     TRIMETH/SULFA >=320 RESISTANT Resistant     AMPICILLIN/SULBACTAM >=32 RESISTANT Resistant     PIP/TAZO 16 SENSITIVE Sensitive     Extended ESBL POSITIVE Resistant     * 80,000 COLONIES/mL ESCHERICHIA COLI  Culture, blood (routine x 2)     Status: None (Preliminary result)   Collection Time: 02/02/18  9:13 AM  Result Value Ref Range Status   Specimen Description   Final    BLOOD RIGHT ARM Performed at [facility] [address], [place], [place] [address]    Special Requests   Final    BOTTLES DRAWN AEROBIC AND ANAEROBIC Blood Culture adequate volume Performed at [facility] [address] [place], [place] [address]    Culture   Final    NO GROWTH 4 DAYS Performed at [facility], [place] [address], [place], [place] [address]    Report Status PENDING  Incomplete  Culture, blood (routine x 2)     Status: None (Preliminary result)   Collection Time: 02/02/18  9:13 AM  Result Value Ref Range Status   Specimen Description   Final    BLOOD LEFT HAND Performed at [facility] [address] [place], [place] [address]    Special Requests   Final    BOTTLES DRAWN AEROBIC ONLY Blood Culture adequate volume Performed at [facility] [address] [place], [place] [address]    Culture    Final    NO GROWTH 4 DAYS Performed at [facility], [place] [address] [place], [place] [address]    Report Status PENDING  Incomplete         Radiology Studies: Dg Chest Port 1 View  Result Date: 02/05/2018 CLINICAL DATA:  Hypoxia. EXAM: PORTABLE CHEST 1 VIEW COMPARISON:  Chest CT yesterday. FINDINGS: Right upper extremity PICC tip in the proximal SVC. Again seen cardiomegaly with tortuous atherosclerotic thoracic aorta. Bilateral pleural effusions, unchanged of the left but worsened on the right. Slight vascular congestion. No pulmonary edema or pneumothorax. IMPRESSION: 1. Bilateral pleural effusions, increasing on the right and unchanged on the  left. 2. Cardiomegaly with slight vascular congestion. Atherosclerotic tortuous thoracic aorta. Electronically Signed   By: Keith Rake M.D.   On: 02/05/2018 05:21      Scheduled Meds: . aspirin  81 mg Oral Daily  . atenolol  50 mg Oral Daily  . DULoxetine  90 mg Oral q morning - 10a  . enoxaparin (LOVENOX) injection  40 mg Subcutaneous Daily  . gabapentin  1,600 mg Oral QHS  . gabapentin  800 mg Oral BID WC  . morphine  15 mg Oral TID  . rosuvastatin  40 mg Oral q1800  . sodium chloride flush  10-40 mL Intracatheter Q12H  . sodium chloride flush  3 mL Intravenous Q12H   Continuous Infusions: . sodium chloride    . ertapenem Stopped (02/06/18 0343)     LOS: 2 days    Time spent in minutes: 35    Debbe Odea, MD [facility] Pager: [email or web address] Password [email or web address] 02/06/2018, 1:24 PM

## 2018-02-06 NOTE — NC FL2 (Signed)
[place] MEDICAID FL2 LEVEL OF CARE SCREENING TOOL     IDENTIFICATION  Patient Name: [name] Birthdate: January 19, 1934 Sex: female Admission Date (Current Location): 02/03/2018  [facility] and [place] Number:  Herbalist and Address:  [facility],  [place] [address], [place]      Provider Number: [id number]  Attending Physician Name and Address:  Debbe Odea, MD  Relative Name and Phone Number:       Current Level of Care: Hospital Recommended Level of Care: [place] Prior Approval Number:    Date Approved/Denied:   PASRR Number: [id number]  Discharge Plan: SNF    Current Diagnoses: Patient Active Problem List   Diagnosis Date Noted  . Acute hypoxemic respiratory failure ([place]) 02/05/2018  . Hypoxia 02/03/2018  . Infestation by bed bug 02/03/2018  . Urinary tract infection due to extended-spectrum beta lactamase (ESBL) producing Escherichia coli 02/02/2018  . Chronic a-fib 02/02/2018  . Acute pyelonephritis 01/31/2018  . Constipation 01/31/2018  . Urolithiasis 01/30/2018  . Hydronephrosis with renal and ureteral calculus obstruction 01/30/2018  . Fall   . T12 compression fracture ([place]) 09/04/2017  . E coli bacteremia 07/03/2017  . Fall at home, initial encounter 07/03/2017  . Tachycardia 02/03/2017  . Sepsis secondary to UTI ([place]) 02/02/2017  . Chronic pain syndrome 02/02/2017  . Abnormal EKG 11/21/2013  . [name] (abdominal aortic aneurysm) ([place]) 09/30/2013  . OSA (obstructive sleep apnea) 06/29/2012  . Dementia ([place]) 06/29/2012  . Pre-diabetes 06/29/2012  . Hypoxia, sleep related 06/29/2012  . RA (rheumatoid arthritis) ([place]) 06/29/2012  . Nonspecific abnormal results of cardiovascular function study 06/02/2011  . Nonspecific abnormal electrocardiogram (ECG) (EKG) 05/18/2011  . OSTEOPOROSIS 04/09/2008  . FLANK PAIN, RIGHT 03/04/2008  . Hyperlipidemia 05/12/2006  . PYELONEPHRITIS, ACUTE 05/12/2006  . DEPENDENT  EDEMA, LEGS, BILATERAL 05/12/2006  . URINARY INCONTINENCE 05/12/2006  . PERIPHERAL NEUROPATHY 03/11/2006  . Essential hypertension 03/11/2006  . DYSURIA 03/11/2006    Orientation RESPIRATION BLADDER Height & Weight     Self, Place  O2, Other (Comment)(CPAP (Medium Adult Full Face Mask) 16 breaths/min 2 lpm 32% oxygen percent) Incontinent Weight: 176 lb 2.4 oz (79.9 kg) Height:  5\' 4"  (162.6 cm)  BEHAVIORAL SYMPTOMS/MOOD NEUROLOGICAL BOWEL NUTRITION STATUS        Diet(see dc summary)  AMBULATORY STATUS COMMUNICATION OF NEEDS Skin   Extensive Assist Verbally Normal                       Personal Care Assistance Level of Assistance  Bathing, Feeding, Dressing Bathing Assistance: Maximum assistance Feeding assistance: Independent Dressing Assistance: Maximum assistance     Functional Limitations Info  Sight, Hearing, Speech Sight Info: Adequate Hearing Info: Adequate Speech Info: Adequate    SPECIAL CARE FACTORS FREQUENCY  PT (By licensed PT)     PT Frequency: Min 3x/week              Contractures      Additional Factors Info  Code Status, Allergies, Isolation Precautions Code Status Info: Full Code Allergies Info: Sulfonamide Derivatives     Isolation Precautions Info: Contact Precautions   Infection:ESBL     Current Medications (02/06/2018):  This is the current hospital active medication list Current Facility-Administered Medications  Medication Dose Route Frequency Provider Last Rate Last Dose  . 0.9 %  sodium chloride infusion  250 mL Intravenous PRN Gala Romney L, MD      . aspirin chewable tablet 81 mg  81 mg Oral Daily Elwyn Reach, MD   81 mg at 02/06/18 1050  . atenolol (TENORMIN) tablet 50 mg  50 mg Oral Daily Gala Romney L, MD   50 mg at 02/06/18 1050  . DULoxetine (CYMBALTA) DR capsule 90 mg  90 mg Oral q morning - 10a Jonelle Sidle, Mohammad L, MD   90 mg at 02/06/18 1050  . enoxaparin (LOVENOX) injection 40 mg  40 mg Subcutaneous Daily  Gala Romney L, MD   40 mg at 02/06/18 1050  . ertapenem (INVANZ) 1,000 mg in sodium chloride 0.9 % 100 mL IVPB  1 g Intravenous Daily Elwyn Reach, MD   Stopped at 02/06/18 0343  . gabapentin (NEURONTIN) capsule 1,600 mg  1,600 mg Oral QHS Gala Romney L, MD   1,600 mg at 02/05/18 2107  . gabapentin (NEURONTIN) capsule 800 mg  800 mg Oral BID WC Jonelle Sidle, Mohammad L, MD   800 mg at 02/06/18 1049  . morphine (MSIR) tablet 15 mg  15 mg Oral TID Elwyn Reach, MD   15 mg at 02/06/18 1050  . ondansetron (ZOFRAN) tablet 4 mg  4 mg Oral Q6H PRN Elwyn Reach, MD       Or  . ondansetron (ZOFRAN) injection 4 mg  4 mg Intravenous Q6H PRN Jonelle Sidle, Mohammad L, MD      . rosuvastatin (CRESTOR) tablet 40 mg  40 mg Oral q1800 Gala Romney L, MD   40 mg at 02/05/18 1700  . sodium chloride flush (NS) 0.9 % injection 10-40 mL  10-40 mL Intracatheter Q12H Debbe Odea, MD   10 mL at 02/05/18 0912  . sodium chloride flush (NS) 0.9 % injection 10-40 mL  10-40 mL Intracatheter PRN Debbe Odea, MD   10 mL at 02/05/18 1552  . sodium chloride flush (NS) 0.9 % injection 3 mL  3 mL Intravenous Q12H Elwyn Reach, MD   3 mL at 02/05/18 0939  . sodium chloride flush (NS) 0.9 % injection 3 mL  3 mL Intravenous PRN Elwyn Reach, MD         Discharge Medications: Please see discharge summary for a list of discharge medications.  Relevant Imaging Results:  Relevant Lab Results:   Additional Information ssn:[id number]    IV antibiotics  Burnis Medin, LCSW

## 2018-02-06 NOTE — Clinical Social Work Note (Signed)
Clinical Social Work Assessment  Patient Details  Name: [name] MRN: [id number] Date of Birth: 1934/02/20  Date of referral:  02/06/18               Reason for consult:  Facility Placement                Permission sought to share information with:    Permission granted to share information::     Name::        Agency::     Relationship::     Contact Information:     Housing/Transportation Living arrangements for the past 2 months:  [place] of Information:  Adult Children(Daughter - [name] [phone])) Patient Interpreter Needed:  None Criminal Activity/Legal Involvement Pertinent to Current Situation/Hospitalization:  No - Comment as needed Significant Relationships:  Adult Children Lives with:  Adult Children(son) Do you feel safe going back to the place where you live?  (PT recommending SNF) Need for family participation in patient care:  Yes (Comment)  Care giving concerns:  Patient's daughter reported that patient is from home with son. Patient's daughter reported that patient requires assistance with ADLs and uses a walker to ambulate. Patient recently discharged home with home health (IV antibiotics). Patient admitted with principal problem "hypoxemic respiratory failure". PT recommending SNF.   Social Worker assessment / plan:  CSW spoke with patient's daughter at bedside regarding discharge planning and SNF recommendation, patient asleep. Patient's daughter reported that patient has been to SNF in the past and that she prefers that patient return to [facility]. CSW explained SNF placement process and medicare coverage, patient's daughter verbalized understanding. CSW inquired about patient's living environment, patient's daughter reported no concerns and reported that the patient having bed bugs is a new isolated event. Per chart review, patient's son reported that he was going to treat for the bed bugs. Patient's daughter reported no  concerns about patient's living environment and reported that patient likes living with her son. CSW agreed to follow up with preferred SNF. Patient's daughter reported that it is the only place they want patient to go to.   CSW completed FL2 and followed up with [facility] admissions staff [name]. Staff reported that they will review patient's referral and agreed to contact CSW with an update on their ability to accept patient.  CSW will continue to follow and assist with discharge planning.  Employment status:  Retired Forensic scientist:  Medicare PT Recommendations:  [place] / Referral to community resources:  [place]  Patient/Family's Response to care:  Patient's daughter appreciative of CSW assistance with discharge planning.   Patient/Family's Understanding of and Emotional Response to Diagnosis, Current Treatment, and Prognosis:  Patient's daughter involved in patient's care and verbalized plan for patient to discharge to SNF for ST rehab prior to returning home. Patient's daughter declined any concerns about patient's living environment.   Emotional Assessment Appearance:  Appears stated age Attitude/Demeanor/Rapport:  Unable to Assess Affect (typically observed):  Unable to Assess Orientation:  Oriented to Self Alcohol / Substance use:  Not Applicable Psych involvement (Current and /or in the community):  No (Comment)  Discharge Needs  Concerns to be addressed:  Care Coordination Readmission within the last 30 days:  Yes Current discharge risk:  Physical Impairment Barriers to Discharge:  Continued Medical Work up   [employer], LCSW 02/06/2018, 2:07 PM

## 2018-02-06 NOTE — Progress Notes (Signed)
  Echocardiogram 2D Echocardiogram has been performed.  Jannett Celestine 02/06/2018, 3:21 PM

## 2018-02-06 NOTE — Consult Note (Signed)
   [facility] CM Inpatient Consult   02/06/2018  TAMIKO LEOPARD 1933-08-12 [id number]   Patient screened for potential [facility] Care Management program services due to hospital readmission.   Spoke with inpatient RNCM. Discharge plan is for SNF. However, it appears patient could benefit from additional [place] Management support while at SNF due to frequent hospital readmissions and social concerns.  Went to bedside to speak with family. Spoke with patient's daughter- Lucretia Roers- about [place] Management follow up while at [facility]. She is agreeable and [facility] written consent obtained. [facility] folder provided. Learta Codding also suggested that Probation officer contact patient's son [name] at [phone] to discuss services as well.   Telephone call to [place]. Explained [facility] Care Management. He is also agreeable and endorses he should be primary contact concerning patient's needs. [name] [phone]. Audelia Acton also confirms discharge plan is for SNF. Writer explained that [place] Management will not interfere or replace services that will be provided by SNF.   Will continue to follow and make appropriate referrals once facility is known.   Will make inpatient RNCM aware [facility] to follow.   Marthenia Rolling, MSN-Ed, RN,BSN [facility] Liaison [phone]

## 2018-02-07 ENCOUNTER — Encounter (HOSPITAL_COMMUNITY): Payer: Self-pay

## 2018-02-07 DIAGNOSIS — J9621 Acute and chronic respiratory failure with hypoxia: Secondary | ICD-10-CM

## 2018-02-07 LAB — BLOOD GAS, ARTERIAL
Acid-Base Excess: 11.6 mmol/L — ABNORMAL HIGH (ref 0.0–2.0)
Bicarbonate: 38.2 mmol/L — ABNORMAL HIGH (ref 20.0–28.0)
Drawn by: 441261
O2 Content: 3 L/min
O2 Saturation: 98.2 %
Patient temperature: 98.6
pCO2 arterial: 60.9 mmHg — ABNORMAL HIGH (ref 32.0–48.0)
pH, Arterial: 7.414 (ref 7.350–7.450)
pO2, Arterial: 108 mmHg (ref 83.0–108.0)

## 2018-02-07 LAB — CULTURE, BLOOD (ROUTINE X 2)
Culture: NO GROWTH
Culture: NO GROWTH
Special Requests: ADEQUATE
Special Requests: ADEQUATE

## 2018-02-07 MED ORDER — SODIUM CHLORIDE 0.9 % IV BOLUS
500.0000 mL | Freq: Once | INTRAVENOUS | Status: AC
  Administered 2018-02-07: 500 mL via INTRAVENOUS

## 2018-02-07 MED ORDER — MORPHINE SULFATE 15 MG PO TABS: 15.0000 mg | ORAL_TABLET | Freq: Three times a day (TID) | ORAL | Status: DC

## 2018-02-07 MED ORDER — SODIUM CHLORIDE 0.9 % IV SOLN
INTRAVENOUS | Status: DC
  Administered 2018-02-07 – 2018-02-08 (×3): via INTRAVENOUS

## 2018-02-07 NOTE — Care Management Important Message (Signed)
Important Message  Patient Details  Name: [name] MRN: [id number] Date of Birth: [birth year]   Medicare Important Message Given:  Yes    Kerin Salen 02/07/2018, 10:22 AMImportant Message  Patient Details  Name: [name] MRN: [id number] Date of Birth: [birth year]   Medicare Important Message Given:  Yes    Kerin Salen 02/07/2018, 10:22 AM

## 2018-02-07 NOTE — Progress Notes (Signed)
Foley Catheter placed and immediate 550cc of tea color urine returned. Will continue to monitor patient closely.

## 2018-02-07 NOTE — Progress Notes (Signed)
[facility] PROGRESS NOTE    Progress Note  [name]  QZR:[id number] DOB: 1934/04/22 DOA: 02/03/2018 PCP: [name], MD     Brief Narrative:   [name] is an 84 y.o. female past medical history of significant of diabetes, hydronephrosis with a UTI, chronic atrial fibrillation not on to correlation due to falls, and abdominal aortic aneurysm chronic pain syndrome and dementia who was just discharged home on the day of admission after 4-day hospital stay due to an ESBL E. coli UTI she had been on IV meropenem while at home home care found her saturations to be in the 70s and she was confused, brought to the ED on hypoxia with worsening right sided pleural effusion  Assessment/Plan:   Acute on chronic respiratory failure with hypoxia and hypercarbia ([place]) Initially thought to be secondary to her pleural effusion she was diuresis aggressively, but her pulse ox remained at 70 on room air with a PO2 of 52, chest x-ray showed minimal improvement. ABG on admission show a CO2 of 62, and she has diuresed her bicarb has worsen, will repeat an ABG and place her on BiPAP if her CO2 is high  Acute encephalopathy likely multifactorial likely due to narcotics and hypercarbia hold narcotics. Check an ABG hold narcotics. He has remained afebrile no leukocytosis.  Urinary tract infection due to extended-spectrum beta lactamase (ESBL) producing  Escherichia coli/nephrolithiasis/hydronephrosis: On IV INvanz until 02/14/2018 PICC line in place. She is status post right urethral stent placement with urology follow-up CT scan of the abdomen and pelvis showed a right proximal 6 mm obstructing nephrolithiasis with hydro-nephrosis and perinephric stranding  Chronic atrial fibrillation: Not on anticoagulation due to falls. Continue Tylenol rate control.  Hyperlipidemia: Continue Crestor.  Essential hypertension: Continue atenolol.  Dementia: Only alert to person.  Gestation of  bedbugs: Gust with family.  Sleep apnea: Continue CPAP.  Infestation by bed bug Discussed with family.   DVT prophylaxis: lovenox Family Communication:son Disposition Plan/Barrier to D/C: awaiting ABg Code Status:     Code Status Orders  (From admission, onward)         Start     Ordered   02/04/18 0000  Full code  Continuous     02/04/18 0000        Code Status History    Date Active Date Inactive Code Status Order ID Comments User Context   01/30/2018 2307 02/03/2018 1713 Full Code [id number]  Rise Patience, MD ED   09/04/2017 1601 09/08/2017 1427 Full Code [id number]  Cristal Ford, DO ED   07/03/2017 0438 07/05/2017 2017 Full Code [id number]  Norval Morton, MD ED   02/02/2017 0159 02/05/2017 1953 Full Code [id number]  Etta Quill, DO ED        IV Access:    Peripheral IV   Procedures and diagnostic studies:   No results found.   Medical Consultants:    None.  Anti-Infectives:   Invanz  Subjective:    [name] she is lethargic  Objective:    Vitals:   02/06/18 0644 02/06/18 1049 02/06/18 1426 02/07/18 0603  BP: 107/73 110/74 112/80 93/63  Pulse: 87 82 91 92  Resp: 18  18 14   Temp: 97.8 F (36.6 C)  98.7 F (37.1 C) 99 F (37.2 C)  TempSrc:   Oral Axillary  SpO2:   99% 99%  Weight:      Height:        Intake/Output Summary (Last 24 hours) at  02/07/2018 0859 Last data filed at 02/07/2018 0600 Gross per 24 hour  Intake 60 ml  Output 375 ml  Net -315 ml   Filed Weights   02/04/18 0002  Weight: 79.9 kg    Exam: General exam: In no acute distress. Respiratory system: Good air movement and clear to auscultation. Cardiovascular system: S1 & S2 heard, RRR.  Gastrointestinal system: Abdomen is nondistended, soft and nontender.  Central nervous system: Alert and oriented. No focal neurological deficits. Extremities: No pedal edema. Skin: No rashes, lesions or ulcers Psychiatry: [name] only responsive to  pain.   Data Reviewed:    Labs: Basic Metabolic Panel: Recent Labs  Lab 02/02/18 0531 02/03/18 1952 02/04/18 0812 02/05/18 0807  NA 140 140 140 139  K 4.2 3.8 3.7 3.4*  CL 104 101 100 89*  CO2 27 32 32 37*  GLUCOSE 97 145* 102* 109*  BUN 19 20 20 20   CREATININE 0.98 0.94 0.93 0.96  CALCIUM 8.6* 8.8* 8.5* 8.9   GFR Estimated Creatinine Clearance: 46.2 mL/min (by C-G formula based on SCr of 0.96 mg/dL). Liver Function Tests: Recent Labs  Lab 02/03/18 1952 02/04/18 0812  AST 16 16  ALT 9 11  ALKPHOS 56 57  BILITOT 0.3 0.5  PROT 6.3* 6.2*  ALBUMIN 2.6* 2.5*   No results for input(s): LIPASE, AMYLASE in the last 168 hours. No results for input(s): AMMONIA in the last 168 hours. Coagulation profile No results for input(s): INR, PROTIME in the last 168 hours.  CBC: Recent Labs  Lab 02/01/18 0525 02/02/18 0531 02/03/18 0522 02/03/18 1952 02/04/18 0812  WBC 12.7* 7.6 11.0* 9.4 8.4  NEUTROABS  --   --   --  6.4  --   HGB 11.8* 10.7* 10.9* 11.0* 10.6*  HCT 38.5 35.0* 35.9* 35.1* 34.3*  MCV 94.4 95.1 95.0 93.6 94.5  PLT 187 180 171 190 179   Cardiac Enzymes: Recent Labs  Lab 02/02/18 1418 02/03/18 1952  TROPONINI <0.03 <0.03   BNP (last 3 results) No results for input(s): PROBNP in the last 8760 hours. CBG: No results for input(s): GLUCAP in the last 168 hours. D-Dimer: No results for input(s): DDIMER in the last 72 hours. Hgb A1c: No results for input(s): HGBA1C in the last 72 hours. Lipid Profile: No results for input(s): CHOL, HDL, LDLCALC, TRIG, CHOLHDL, LDLDIRECT in the last 72 hours. Thyroid function studies: No results for input(s): TSH, T4TOTAL, T3FREE, THYROIDAB in the last 72 hours.  Invalid input(s): FREET3 Anemia work up: No results for input(s): VITAMINB12, FOLATE, FERRITIN, TIBC, IRON, RETICCTPCT in the last 72 hours. Sepsis Labs: Recent Labs  Lab 02/02/18 0531 02/03/18 0522 02/03/18 1952 02/04/18 0812  WBC 7.6 11.0* 9.4 8.4    Microbiology Recent Results (from the past 240 hour(s))  Blood Culture (routine x 2)     Status: Abnormal   Collection Time: 01/30/18  8:57 PM  Result Value Ref Range Status   Specimen Description   Final    BLOOD RIGHT WRIST Performed at [facility], [address], [place], [place] [address]    Special Requests   Final    BOTTLES DRAWN AEROBIC AND ANAEROBIC Blood Culture adequate volume Performed at [place] [address] [place], [place] [address]    Culture  Setup Time   Final    GRAM NEGATIVE RODS IN BOTH AEROBIC AND ANAEROBIC BOTTLES CRITICAL RESULT CALLED TO, READ BACK BY AND VERIFIED WITH: Karie Kirks PharmD 13:05 01/31/18 ([email or web address])  Performed at [facility], [place] [address], [place], [place] [address]    Culture (A)  Final    ESCHERICHIA COLI Confirmed Extended Spectrum Beta-Lactamase Producer (ESBL).  In bloodstream infections from ESBL organisms, carbapenems are preferred over piperacillin/tazobactam. They are shown to have a lower risk of mortality.    Report Status 02/02/2018 FINAL  Final   Organism ID, Bacteria ESCHERICHIA COLI  Final      Susceptibility   Escherichia coli - MIC*    AMPICILLIN >=32 RESISTANT Resistant     CEFAZOLIN >=64 RESISTANT Resistant     CEFEPIME >=64 RESISTANT Resistant     CEFTAZIDIME >=64 RESISTANT Resistant     CEFTRIAXONE >=64 RESISTANT Resistant     CIPROFLOXACIN >=4 RESISTANT Resistant     GENTAMICIN <=1 SENSITIVE Sensitive     IMIPENEM <=0.25 SENSITIVE Sensitive     TRIMETH/SULFA >=320 RESISTANT Resistant     AMPICILLIN/SULBACTAM >=32 RESISTANT Resistant     PIP/TAZO 16 SENSITIVE Sensitive     Extended ESBL POSITIVE Resistant     * ESCHERICHIA COLI  Blood Culture ID Panel (Reflexed)     Status: Abnormal   Collection Time: 01/30/18  8:57 PM  Result Value Ref Range Status   Enterococcus species NOT DETECTED NOT DETECTED Final   Listeria monocytogenes NOT DETECTED NOT DETECTED Final    Staphylococcus species NOT DETECTED NOT DETECTED Final   Staphylococcus aureus (BCID) NOT DETECTED NOT DETECTED Final   Streptococcus species NOT DETECTED NOT DETECTED Final   Streptococcus agalactiae NOT DETECTED NOT DETECTED Final   Streptococcus pneumoniae NOT DETECTED NOT DETECTED Final   Streptococcus pyogenes NOT DETECTED NOT DETECTED Final   Acinetobacter baumannii NOT DETECTED NOT DETECTED Final   Enterobacteriaceae species DETECTED (A) NOT DETECTED Final    Comment: Enterobacteriaceae represent a large family of gram-negative bacteria, not a single organism. CRITICAL RESULT CALLED TO, READ BACK BY AND VERIFIED WITH: Karie Kirks PharmD 13:05 01/31/18 (wilsonm)    Enterobacter cloacae complex NOT DETECTED NOT DETECTED Final   Escherichia coli DETECTED (A) NOT DETECTED Final    Comment: CRITICAL RESULT CALLED TO, READ BACK BY AND VERIFIED WITH: Karie Kirks PharmD 13:05 01/31/18 (wilsonm)    Klebsiella oxytoca NOT DETECTED NOT DETECTED Final   Klebsiella pneumoniae NOT DETECTED NOT DETECTED Final   Proteus species NOT DETECTED NOT DETECTED Final   Serratia marcescens NOT DETECTED NOT DETECTED Final   Carbapenem resistance NOT DETECTED NOT DETECTED Final   Haemophilus influenzae NOT DETECTED NOT DETECTED Final   Neisseria meningitidis NOT DETECTED NOT DETECTED Final   Pseudomonas aeruginosa NOT DETECTED NOT DETECTED Final   Candida albicans NOT DETECTED NOT DETECTED Final   Candida glabrata NOT DETECTED NOT DETECTED Final   Candida krusei NOT DETECTED NOT DETECTED Final   Candida parapsilosis NOT DETECTED NOT DETECTED Final   Candida tropicalis NOT DETECTED NOT DETECTED Final    Comment: Performed at [facility], [place] [address] [place], [place] [address]  Blood Culture (routine x 2)     Status: Abnormal   Collection Time: 01/30/18  9:12 PM  Result Value Ref Range Status   Specimen Description   Final    BLOOD RIGHT ANTECUBITAL Performed at [facility]  [address] [place], [place] [address]    Special Requests   Final    BOTTLES DRAWN AEROBIC AND ANAEROBIC Blood Culture adequate volume Performed at [place] [address] [place], [place] [address]    Culture  Setup Time   Final    GRAM NEGATIVE RODS IN BOTH AEROBIC AND ANAEROBIC BOTTLES CRITICAL VALUE NOTED.  VALUE IS CONSISTENT WITH PREVIOUSLY REPORTED AND CALLED VALUE.    Culture (A)  Final    ESCHERICHIA COLI SUSCEPTIBILITIES PERFORMED ON PREVIOUS CULTURE WITHIN THE LAST 5 DAYS. Performed at [facility], [place] [address] [place], [place] [address]    Report Status 02/02/2018 FINAL  Final  Urine Culture     Status: Abnormal   Collection Time: 01/31/18 12:07 AM  Result Value Ref Range Status   Specimen Description   Final    CYSTOSCOPY URINE Performed at [facility] [address], [place], [place] [address]    Special Requests   Final    NONE Performed at [facility], [place] [address] [place], [place] [address]    Culture (A)  Final    >=100,000 COLONIES/mL ESCHERICHIA COLI Confirmed Extended Spectrum Beta-Lactamase Producer (ESBL).  In bloodstream infections from ESBL organisms, carbapenems are preferred over piperacillin/tazobactam. They are shown to have a lower risk of mortality.    Report Status 02/02/2018 FINAL  Final   Organism ID, Bacteria ESCHERICHIA COLI (A)  Final      Susceptibility   Escherichia coli - MIC*    AMPICILLIN >=32 RESISTANT Resistant     CEFAZOLIN >=64 RESISTANT Resistant     CEFEPIME >=64 RESISTANT Resistant     CEFTAZIDIME 16 INTERMEDIATE Intermediate     CEFTRIAXONE >=64 RESISTANT Resistant     CIPROFLOXACIN >=4 RESISTANT Resistant     GENTAMICIN <=1 SENSITIVE Sensitive     IMIPENEM <=0.25 SENSITIVE Sensitive     TRIMETH/SULFA >=320 RESISTANT Resistant     AMPICILLIN/SULBACTAM >=32 RESISTANT Resistant     PIP/TAZO <=4 SENSITIVE Sensitive     Extended ESBL POSITIVE  Resistant     * >=100,000 COLONIES/mL ESCHERICHIA COLI  Urine Culture     Status: Abnormal   Collection Time: 01/31/18  1:37 AM  Result Value Ref Range Status   Specimen Description   Final    URINE, CATHETERIZED Performed at [facility] [address], [place], [place] [address]    Special Requests   Final    NONE Performed at [facility], [place] [address], [place], [place] [address]    Culture (A)  Final    80,000 COLONIES/mL ESCHERICHIA COLI Confirmed Extended Spectrum Beta-Lactamase Producer (ESBL).  In bloodstream infections from ESBL organisms, carbapenems are preferred over piperacillin/tazobactam. They are shown to have a lower risk of mortality.    Report Status 02/02/2018 FINAL  Final   Organism ID, Bacteria ESCHERICHIA COLI (A)  Final      Susceptibility   Escherichia coli - MIC*    AMPICILLIN >=32 RESISTANT Resistant     CEFAZOLIN >=64 RESISTANT Resistant     CEFTRIAXONE >=64 RESISTANT Resistant     CIPROFLOXACIN >=4 RESISTANT Resistant     GENTAMICIN <=1 SENSITIVE Sensitive     IMIPENEM <=0.25 SENSITIVE Sensitive     NITROFURANTOIN <=16 SENSITIVE Sensitive     TRIMETH/SULFA >=320 RESISTANT Resistant     AMPICILLIN/SULBACTAM >=32 RESISTANT Resistant     PIP/TAZO 16 SENSITIVE Sensitive     Extended ESBL POSITIVE Resistant     * 80,000 COLONIES/mL ESCHERICHIA COLI  Culture, blood (routine x 2)     Status: None (Preliminary result)   Collection Time: 02/02/18  9:13 AM  Result Value Ref Range Status   Specimen Description  Final    BLOOD RIGHT ARM Performed at [facility] [address] [place], [place] [address]    Special Requests   Final    BOTTLES DRAWN AEROBIC AND ANAEROBIC Blood Culture adequate volume Performed at [facility] [address], [place], [place] [address]    Culture   Final    NO GROWTH 4 DAYS Performed at [facility], [place] [address] [place], [place]  [address]    Report Status PENDING  Incomplete  Culture, blood (routine x 2)     Status: None (Preliminary result)   Collection Time: 02/02/18  9:13 AM  Result Value Ref Range Status   Specimen Description   Final    BLOOD LEFT HAND Performed at [facility] [address], [place], [place] [address]    Special Requests   Final    BOTTLES DRAWN AEROBIC ONLY Blood Culture adequate volume Performed at [facility] [address] [place], [place] [address]    Culture   Final    NO GROWTH 4 DAYS Performed at [facility], [place] [address], [place], [place] [address]    Report Status PENDING  Incomplete     Medications:   . aspirin  81 mg Oral Daily  . atenolol  50 mg Oral Daily  . DULoxetine  90 mg Oral q morning - 10a  . enoxaparin (LOVENOX) injection  40 mg Subcutaneous Daily  . gabapentin  1,600 mg Oral QHS  . gabapentin  800 mg Oral BID WC  . morphine  15 mg Oral TID  . rosuvastatin  40 mg Oral q1800  . sodium chloride flush  10-40 mL Intracatheter Q12H  . sodium chloride flush  3 mL Intravenous Q12H   Continuous Infusions: . sodium chloride    . ertapenem 1,000 mg (02/07/18 0215)      LOS: 3 days   [facility] Pager [phone]  *Please refer to [email or web address], password [email or web address] to get updated schedule on who will round on this patient, as hospitalists switch teams weekly. If 7PM-7AM, please contact night-coverage at [email or web address], password [email or web address] for any overnight needs.  02/07/2018, 8:59 AM

## 2018-02-07 NOTE — Progress Notes (Signed)
PT Cancellation Note  Patient Details Name: [name] MRN: [id number] DOB: [birth year]   Cancelled Treatment:    Reason Eval/Treat Not Completed: Fatigue/lethargy limiting ability to participate. Will check back another day.    Weston Anna, PT Acute Rehabilitation Services Pager: [phone] Office: [phone]

## 2018-02-07 NOTE — Progress Notes (Signed)
Pt has voided a total of 129mL during shift (1900-0600). Bladder scanner reads 39mL. This RN encouraged pt to try and void but pt was unsuccessful. NP on call notified. Awaiting orders.

## 2018-02-08 DIAGNOSIS — Z1612 Extended spectrum beta lactamase (ESBL) resistance: Secondary | ICD-10-CM

## 2018-02-08 DIAGNOSIS — B9629 Other Escherichia coli [E. coli] as the cause of diseases classified elsewhere: Secondary | ICD-10-CM

## 2018-02-08 DIAGNOSIS — F0390 Unspecified dementia without behavioral disturbance: Secondary | ICD-10-CM

## 2018-02-08 DIAGNOSIS — J9601 Acute respiratory failure with hypoxia: Secondary | ICD-10-CM

## 2018-02-08 DIAGNOSIS — N390 Urinary tract infection, site not specified: Secondary | ICD-10-CM

## 2018-02-08 LAB — BASIC METABOLIC PANEL
Anion gap: 13 (ref 5–15)
BUN: 22 mg/dL (ref 8–23)
CO2: 33 mmol/L — ABNORMAL HIGH (ref 22–32)
Calcium: 8.7 mg/dL — ABNORMAL LOW (ref 8.9–10.3)
Chloride: 97 mmol/L — ABNORMAL LOW (ref 98–111)
Creatinine, Ser: 0.73 mg/dL (ref 0.44–1.00)
GFR calc Af Amer: >60 mL/min (ref >60)
GFR calc non Af Amer: >60 mL/min (ref >60)
Glucose, Bld: 96 mg/dL (ref 70–99)
Potassium: 3.9 mmol/L (ref 3.5–5.1)
Sodium: 143 mmol/L (ref 135–145)

## 2018-02-08 MED ORDER — SODIUM CHLORIDE 0.9 % IV BOLUS
500.0000 mL | Freq: Once | INTRAVENOUS | Status: AC
  Administered 2018-02-08: 500 mL via INTRAVENOUS

## 2018-02-08 MED ORDER — OXYMORPHONE HCL 5 MG PO TABS: 5.0000 mg | ORAL_TABLET | Freq: Three times a day (TID) | ORAL | 0 refills | Status: DC

## 2018-02-08 MED ORDER — GABAPENTIN 400 MG PO CAPS: 400.0000 mg | ORAL_CAPSULE | Freq: Every day | ORAL | 2 refills | Status: DC

## 2018-02-08 NOTE — Clinical Social Work Placement (Signed)
   CLINICAL SOCIAL WORK PLACEMENT  NOTE  Date:  02/08/2018  Patient Details  Name: [name] MRN: [id number] Date of Birth: January 14, 1934  Clinical Social Work is seeking post-discharge placement for this patient at the [place] level of care (*CSW will initial, date and re-position this form in  chart as items are completed):  Yes   Patient/family provided with [place] Work Department's list of facilities offering this level of care within the geographic area requested by the patient (or if unable, by the patient's family).  Yes   Patient/family informed of their freedom to choose among providers that offer the needed level of care, that participate in Medicare, Medicaid or managed care program needed by the patient, have an available bed and are willing to accept the patient.  Yes   Patient/family informed of 's ownership interest in [facility] and [facility], as well as of the fact that they are under no obligation to receive care at these facilities.  PASRR submitted to EDS on       PASRR number received on       Existing PASRR number confirmed on 02/06/18     FL2 transmitted to all facilities in geographic area requested by pt/family on 02/06/18     FL2 transmitted to all facilities within larger geographic area on       Patient informed that his/her managed care company has contracts with or will negotiate with certain facilities, including the following:        Yes   Patient/family informed of bed offers received.  Patient chooses bed at [facility]     Physician recommends and patient chooses bed at      Patient to be transferred to   on  .  Patient to be transferred to facility by       Patient family notified on   of transfer.  Name of family member notified:        PHYSICIAN       Additional Comment:    _______________________________________________ Burnis Medin,  LCSW 02/08/2018, 2:59 PM

## 2018-02-08 NOTE — Progress Notes (Signed)
Pt continues to take off bipap, pt has been educated of importance , pt o2 is 95 at this time, will continue to monitor.

## 2018-02-08 NOTE — Care Management Note (Signed)
Case Management Note  Patient Details  Name: [name] MRN: [id number] Date of Birth: 1933-12-09  Subjective/Objective:   No CM needs.                  Action/Plan:d/c SNF.   Expected Discharge Date:  02/08/18               Expected Discharge Plan:  Skilled Nursing Facility  In-House Referral:  Clinical Social Work  Discharge planning Services  CM Consult  Post Acute Care Choice:  Home Health([facility]/PT/OT-they will not accept back until bed bugs treated.) Choice offered to:     DME Arranged:    DME Agency:     HH Arranged:    HH Agency:     Status of Service:  Completed, signed off  If discussed at [employer], dates discussed:    Additional Comments:  Dessa Phi, RN 02/08/2018, 10:58 AM

## 2018-02-08 NOTE — Progress Notes (Signed)
Pt troponin 0.14. No complaints of chest pain. MD notified.

## 2018-02-08 NOTE — Discharge Summary (Addendum)
Physician Discharge Summary  [name] NGE:[id number] DOB: 14-Dec-1933 DOA: 02/03/2018  PCP: [name], MD  Admit date: 02/03/2018 Discharge date: 02/08/2018  Admitted From: home Disposition:  SNF  Recommendations for Outpatient Follow-up:  1. Follow up with PCP in 1-2 weeks 2. Please obtain BMP/CBC in one week 3. CPAP at night and 10 mmHg and 5 mmHg   Home Health:No Equipment/Devices:none  Discharge Condition:stable CODE STATUS:full Diet recommendation: Heart Healthy   Brief/Interim Summary: 84 y.o. female past medical history of significant of diabetes, hydronephrosis with a UTI, chronic atrial fibrillation not on to correlation due to falls, and abdominal aortic aneurysm chronic pain syndrome and dementia who was just discharged home on the day of admission after 4-day hospital stay due to an ESBL E. coli UTI she had been on IV meropenem while at home home care found her saturations to be in the 70s and she was confused, brought to the ED on hypoxia with worsening right sided pleural effusion  Discharge Diagnoses:  Principal Problem:   Acute on chronic respiratory failure with hypoxia ([place]) Active Problems:   Hyperlipidemia   Essential hypertension   OSA (obstructive sleep apnea)   Dementia (HCC)   Urinary tract infection due to extended-spectrum beta lactamase (ESBL) producing Escherichia coli   Chronic a-fib   Infestation by bed bug   Acute hypoxemic respiratory failure (HCC)  Acute on chronic respiratory failure with hypoxia and hypercarbia: Initially with thought to be secondary to pleural effusion she was aggressively diuresed per her saturations remain at 70, repeated chest x-ray show minimal improvement.  This resolved with holding her narcotics and her gabapentin.  Acute encephalopathy likely multifactorial due to narcotics, gabapentin and hypercarbia: All these medications were held her mentation significantly improved.  She will go home on a lower dose  of gabapentin just at bedtime.  Urinary tract infection due to extended spectrum beta-lactamase producing E. coli Continue Invanz through PICC line her last dose is on 02/14/2018 remove PICC line at this time.  Chronic atrial fibrillation: No changes were made rate control.  Hyperlipidemia: Continue Crestor.  Essential hypertension: Continue Tylenol.  Dementia: Only alert to person no changes made.  Sleep apnea: Continue CPAP.  Discharge Instructions  Discharge Instructions    Diet - low sodium heart healthy   Complete by:  As directed    Increase activity slowly   Complete by:  As directed      Allergies as of 02/08/2018      Reactions   Sulfonamide Derivatives Hives, Rash      Medication List    STOP taking these medications   gabapentin 800 MG tablet Commonly known as:  NEURONTIN Replaced by:  gabapentin 400 MG capsule     TAKE these medications   aspirin 81 MG chewable tablet Chew 81 mg by mouth daily.   atenolol 50 MG tablet Commonly known as:  TENORMIN Take 1 tablet (50 mg total) by mouth daily.   DULoxetine 30 MG capsule Commonly known as:  CYMBALTA Take 90 mg by mouth every morning.   ertapenem  IVPB Commonly known as:  INVANZ Inject 1 g into the vein daily for 12 days. Indication:  Bacteremia Last Day of Therapy:  02/14/2018 Labs - Once weekly:  CBC/D and BMP, Labs - Every other week:  ESR and CRP   gabapentin 400 MG capsule Commonly known as:  NEURONTIN Take 1 capsule (400 mg total) by mouth at bedtime. Replaces:  gabapentin 800 MG tablet   oxymorphone  5 MG tablet Commonly known as:  OPANA Take 1 tablet (5 mg total) by mouth 3 (three) times daily.   rosuvastatin 40 MG tablet Commonly known as:  CRESTOR TAKE 1 TABLET BY MOUTH EVERY DAY       Allergies  Allergen Reactions  . Sulfonamide Derivatives Hives and Rash    Consultations:  None   Procedures/Studies: Ct Angio Chest Pe W And/or Wo Contrast  Result Date:  02/04/2018 CLINICAL DATA:  Complex chest pain, intermediate clinical probability for pulmonary embolism, hypoxia, history atrial fibrillation, dementia, diabetes mellitus, hypertension EXAM: CT ANGIOGRAPHY CHEST WITH CONTRAST TECHNIQUE: Multidetector CT imaging of the chest was performed using the standard protocol during bolus administration of intravenous contrast. Multiplanar CT image reconstructions and MIPs were obtained to evaluate the vascular anatomy. CONTRAST:  116m ISOVUE-370 IOPAMIDOL (ISOVUE-370) INJECTION 76% IV COMPARISON:  09/04/2017 FINDINGS: Cardiovascular: Extensive atherosclerotic calcifications aorta, proximal great vessels and coronary arteries. Ascending thoracic aorta upper normal caliber. Pulmonary arteries well opacified and patent. No evidence of pulmonary embolism. Enlargement of cardiac chambers. No pericardial effusion. Mediastinum/Nodes: Esophagus unremarkable. Base of cervical region normal appearance. No thoracic adenopathy. Lungs/Pleura: BILATERAL pleural effusions with atelectasis of the posterior lower lobes bilaterally. Remaining lungs clear though scattered respiratory motion artifacts are present. Upper Abdomen: Last image demonstrates ectasia of the abdominal aorta, noted on the 09/14/2017 exam, inadequately assessed on this study. Remaining visualized upper abdomen unremarkable. Musculoskeletal: Bones demineralized. Marked compression fracture of T12 vertebral body increased since previous exam. Review of the MIP images confirms the above findings. IMPRESSION: No evidence of pulmonary embolism. Extensive atherosclerotic calcifications of aorta, proximal great vessels and coronary arteries. Small BILATERAL pleural effusions and bibasilar atelectasis. Increased compression fracture of T12 vertebral body. Aortic Atherosclerosis (ICD10-I70.0). Electronically Signed   By: MLavonia DanaM.D.   On: 02/04/2018 13:16   Dg Chest Port 1 View  Result Date: 02/05/2018 CLINICAL DATA:   Hypoxia. EXAM: PORTABLE CHEST 1 VIEW COMPARISON:  Chest CT yesterday. FINDINGS: Right upper extremity PICC tip in the proximal SVC. Again seen cardiomegaly with tortuous atherosclerotic thoracic aorta. Bilateral pleural effusions, unchanged of the left but worsened on the right. Slight vascular congestion. No pulmonary edema or pneumothorax. IMPRESSION: 1. Bilateral pleural effusions, increasing on the right and unchanged on the left. 2. Cardiomegaly with slight vascular congestion. Atherosclerotic tortuous thoracic aorta. Electronically Signed   By: MKeith RakeM.D.   On: 02/05/2018 05:21   Dg Chest Port 1 View  Result Date: 02/03/2018 CLINICAL DATA:  Low O2 sats. EXAM: PORTABLE CHEST 1 VIEW COMPARISON:  February 02, 2018 FINDINGS: There is a left-sided pleural effusion with underlying opacity, similar in the interval. The probable tiny layering effusion on the right seen yesterday is not definitely seen today. A right PICC line is in good position. No pneumothorax. Stable cardiomegaly. No other change. IMPRESSION: 1. The right PICC line is in good position. 2. Left-sided pleural effusion with underlying opacity, not significantly changed. Electronically Signed   By: DDorise BullionIII M.D   On: 02/03/2018 19:31   Dg Chest Port 1 View  Result Date: 02/02/2018 CLINICAL DATA:  PICC line placement evaluation. EXAM: PORTABLE CHEST 1 VIEW COMPARISON:  Sep 04, 2017 FINDINGS: A right PICC line terminates in the central SVC. Stable cardiomegaly. Small bilateral effusions are new. No other interval changes or acute abnormalities. IMPRESSION: 1. The right PICC line terminates in the central SVC. 2. Small bilateral effusions with underlying atelectasis, new in the interval. Electronically Signed   By:  Dorise Bullion III M.D   On: 02/02/2018 21:27   Dg Knee Complete 4 Views Left  Result Date: 02/03/2018 CLINICAL DATA:  Left knee pain today. EXAM: LEFT KNEE - COMPLETE 4+ VIEW COMPARISON:  Remote radiographs  12/16/2005 FINDINGS: No fracture or dislocation. Tricompartmental osteoarthritis with peripheral spurring, progressed from remote prior exam. Lateral tibiofemoral joint space narrowing. There is tibiofemoral chondrocalcinosis. Small joint effusion. Advanced vascular calcifications. IMPRESSION: Moderate to advanced tricompartmental osteoarthritis without acute osseous abnormality. Electronically Signed   By: Keith Rake M.D.   On: 02/03/2018 23:27   Dg C-arm 1-60 Min-no Report  Result Date: 01/31/2018 Fluoroscopy was utilized by the requesting physician.  No radiographic interpretation.   Ct Renal Stone Study  Result Date: 01/30/2018 CLINICAL DATA:  Acute abdominal flank pain worse on the right EXAM: CT ABDOMEN AND PELVIS WITHOUT CONTRAST TECHNIQUE: Multidetector CT imaging of the abdomen and pelvis was performed following the standard protocol without IV contrast. COMPARISON:  09/04/2017 FINDINGS: Lower chest: Bibasilar atelectasis. Mild cardiomegaly. No pericardial or pleural effusion. Small hiatal hernia. Descending thoracic aorta is tortuous and ectatic with atherosclerosis. Hepatobiliary: No focal liver abnormality is seen. Status post cholecystectomy. No biliary dilatation. Pancreas: Unremarkable. No pancreatic ductal dilatation or surrounding inflammatory changes. Spleen: Normal in size without focal abnormality. Adrenals/Urinary Tract: Normal adrenal glands. Right kidney demonstrates acute perinephric strandy edema/inflammation. There is associated moderate right hydroureteronephrosis secondary to a proximal obstructing right ureteral calculus measuring 6 mm, image 34 series 2. There is a stable 2.5 cm right kidney lower pole hypodense cyst medially. Left kidney and ureter demonstrate no acute process or obstruction. Left ureter is nondilated. Air noted within the bladder dome.  Bladder otherwise unremarkable. Stomach/Bowel: Negative for bowel obstruction, significant dilatation, ileus, or free  air. Appendix not visualized. No fluid collection or abscess. Moderate colonic stool burden. Extensive sigmoid diverticulosis without acute inflammatory process. Vascular/Lymphatic: Atherosclerosis of the aorta. Infrarenal aorta has aneurysm dilatation measuring 3.9 cm. Extensive iliac atherosclerosis as well. No retroperitoneal hemorrhage or hematoma. No adenopathy. Reproductive: Remote hysterectomy. No other pelvic abnormality, fluid collection, ascites, or abscess. Other: No abdominal wall hernia or abnormality. No abdominopelvic ascites. Musculoskeletal: Bones are osteopenic. Degenerative changes of the spine. Extensive facet arthropathy. T12 and L5 subacute partially healing compression fractures noted when compared to 07/03/2017. IMPRESSION: Right proximal ureteral 6 mm obstructing calculus with associated right hydroureteronephrosis and perinephric/ureteral inflammation and edema. 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 Remote cholecystectomy, appendectomy and hysterectomy Colonic diverticulosis without acute inflammation or complicating feature Subacute T12 and L5 compression fractures, new since 07/03/2017 Electronically Signed   By: Jerilynn Mages.  Shick M.D.   On: 01/30/2018 22:00   [place] Ekg Site Rite  Result Date: 02/02/2018 If Site Rite image not attached, placement could not be confirmed due to current cardiac rhythm.    Subjective: Patient relates her pain is not controlled.  Discharge Exam: Vitals:   02/07/18 2043 02/08/18 0431  BP: 116/79 104/80  Pulse: 94 97  Resp: 20   Temp: 98.6 F (37 C) 98.7 F (37.1 C)  SpO2: 100% 100%   Vitals:   02/07/18 0603 02/07/18 1208 02/07/18 2043 02/08/18 0431  BP: 93/63 96/76 116/79 104/80  Pulse: 92 87 94 97  Resp: 14  20   Temp: 99 F (37.2 C)  98.6 F (37 C) 98.7 F (37.1 C)  TempSrc: Axillary  Oral Oral  SpO2: 99% 97% 100% 100%  Weight:      Height:  General: Pt is alert,  awake, not in acute distress Cardiovascular: RRR, S1/S2 +, no rubs, no gallops Respiratory: CTA bilaterally, no wheezing, no rhonchi Abdominal: Soft, NT, ND, bowel sounds + Extremities: no edema, no cyanosis    The results of significant diagnostics from this hospitalization (including imaging, microbiology, ancillary and laboratory) are listed below for reference.     Microbiology: Recent Results (from the past 240 hour(s))  Blood Culture (routine x 2)     Status: Abnormal   Collection Time: 01/30/18  8:57 PM  Result Value Ref Range Status   Specimen Description   Final    BLOOD RIGHT WRIST Performed at [facility], [address] [place], [place] [address]    Special Requests   Final    BOTTLES DRAWN AEROBIC AND ANAEROBIC Blood Culture adequate volume Performed at [facility] [address], [place], [place] [address]    Culture  Setup Time   Final    GRAM NEGATIVE RODS IN BOTH AEROBIC AND ANAEROBIC BOTTLES CRITICAL RESULT CALLED TO, READ BACK BY AND VERIFIED WITH: Karie Kirks PharmD 13:05 01/31/18 ([email or web address]) Performed at [facility], [address] [place], [place] [address]    Culture (A)  Final    ESCHERICHIA COLI Confirmed Extended Spectrum Beta-Lactamase Producer (ESBL).  In bloodstream infections from ESBL organisms, carbapenems are preferred over piperacillin/tazobactam. They are shown to have a lower risk of mortality.    Report Status 02/02/2018 FINAL  Final   Organism ID, Bacteria ESCHERICHIA COLI  Final      Susceptibility   Escherichia coli - MIC*    AMPICILLIN >=32 RESISTANT Resistant     CEFAZOLIN >=64 RESISTANT Resistant     CEFEPIME >=64 RESISTANT Resistant     CEFTAZIDIME >=64 RESISTANT Resistant     CEFTRIAXONE >=64 RESISTANT Resistant     CIPROFLOXACIN >=4 RESISTANT Resistant     GENTAMICIN <=1 SENSITIVE Sensitive     IMIPENEM <=0.25 SENSITIVE Sensitive     TRIMETH/SULFA >=320 RESISTANT Resistant      AMPICILLIN/SULBACTAM >=32 RESISTANT Resistant     PIP/TAZO 16 SENSITIVE Sensitive     Extended ESBL POSITIVE Resistant     * ESCHERICHIA COLI  Blood Culture ID Panel (Reflexed)     Status: Abnormal   Collection Time: 01/30/18  8:57 PM  Result Value Ref Range Status   Enterococcus species NOT DETECTED NOT DETECTED Final   Listeria monocytogenes NOT DETECTED NOT DETECTED Final   Staphylococcus species NOT DETECTED NOT DETECTED Final   Staphylococcus aureus (BCID) NOT DETECTED NOT DETECTED Final   Streptococcus species NOT DETECTED NOT DETECTED Final   Streptococcus agalactiae NOT DETECTED NOT DETECTED Final   Streptococcus pneumoniae NOT DETECTED NOT DETECTED Final   Streptococcus pyogenes NOT DETECTED NOT DETECTED Final   Acinetobacter baumannii NOT DETECTED NOT DETECTED Final   Enterobacteriaceae species DETECTED (A) NOT DETECTED Final    Comment: Enterobacteriaceae represent a large family of gram-negative bacteria, not a single organism. CRITICAL RESULT CALLED TO, READ BACK BY AND VERIFIED WITH: Karie Kirks PharmD 13:05 01/31/18 ([email or web address])    Enterobacter cloacae complex NOT DETECTED NOT DETECTED Final   Escherichia coli DETECTED (A) NOT DETECTED Final    Comment: CRITICAL RESULT CALLED TO, READ BACK BY AND VERIFIED WITH: Karie Kirks PharmD 13:05 01/31/18 ([email or web address])    Klebsiella oxytoca NOT DETECTED NOT DETECTED Final   Klebsiella pneumoniae NOT DETECTED NOT DETECTED Final   Proteus species NOT DETECTED NOT DETECTED Final   Serratia  marcescens NOT DETECTED NOT DETECTED Final   Carbapenem resistance NOT DETECTED NOT DETECTED Final   Haemophilus influenzae NOT DETECTED NOT DETECTED Final   Neisseria meningitidis NOT DETECTED NOT DETECTED Final   Pseudomonas aeruginosa NOT DETECTED NOT DETECTED Final   Candida albicans NOT DETECTED NOT DETECTED Final   Candida glabrata NOT DETECTED NOT DETECTED Final   Candida krusei NOT DETECTED NOT DETECTED Final   Candida parapsilosis NOT DETECTED NOT  DETECTED Final   Candida tropicalis NOT DETECTED NOT DETECTED Final    Comment: Performed at [facility], [place] [address], [place], [place] [address]  Blood Culture (routine x 2)     Status: Abnormal   Collection Time: 01/30/18  9:12 PM  Result Value Ref Range Status   Specimen Description   Final    BLOOD RIGHT ANTECUBITAL Performed at [facility] [address], [place], [place] [address]    Special Requests   Final    BOTTLES DRAWN AEROBIC AND ANAEROBIC Blood Culture adequate volume Performed at [facility] [address], [place], [place] [address]    Culture  Setup Time   Final    GRAM NEGATIVE RODS IN BOTH AEROBIC AND ANAEROBIC BOTTLES CRITICAL VALUE NOTED.  VALUE IS CONSISTENT WITH PREVIOUSLY REPORTED AND CALLED VALUE.    Culture (A)  Final    ESCHERICHIA COLI SUSCEPTIBILITIES PERFORMED ON PREVIOUS CULTURE WITHIN THE LAST 5 DAYS. Performed at [facility], [place] [address] [place], [place] [address]    Report Status 02/02/2018 FINAL  Final  Urine Culture     Status: Abnormal   Collection Time: 01/31/18 12:07 AM  Result Value Ref Range Status   Specimen Description   Final    CYSTOSCOPY URINE Performed at [facility] [address] [place], [place] [address]    Special Requests   Final    NONE Performed at [facility], [place] [address] [place], [place] [address]    Culture (A)  Final    >=100,000 COLONIES/mL ESCHERICHIA COLI Confirmed Extended Spectrum Beta-Lactamase Producer (ESBL).  In bloodstream infections from ESBL organisms, carbapenems are preferred over piperacillin/tazobactam. They are shown to have a lower risk of mortality.    Report Status 02/02/2018 FINAL  Final   Organism ID, Bacteria ESCHERICHIA COLI (A)  Final      Susceptibility   Escherichia coli - MIC*    AMPICILLIN >=32 RESISTANT Resistant     CEFAZOLIN >=64 RESISTANT Resistant     CEFEPIME >=64  RESISTANT Resistant     CEFTAZIDIME 16 INTERMEDIATE Intermediate     CEFTRIAXONE >=64 RESISTANT Resistant     CIPROFLOXACIN >=4 RESISTANT Resistant     GENTAMICIN <=1 SENSITIVE Sensitive     IMIPENEM <=0.25 SENSITIVE Sensitive     TRIMETH/SULFA >=320 RESISTANT Resistant     AMPICILLIN/SULBACTAM >=32 RESISTANT Resistant     PIP/TAZO <=4 SENSITIVE Sensitive     Extended ESBL POSITIVE Resistant     * >=100,000 COLONIES/mL ESCHERICHIA COLI  Urine Culture     Status: Abnormal   Collection Time: 01/31/18  1:37 AM  Result Value Ref Range Status   Specimen Description   Final    URINE, CATHETERIZED Performed at [facility] [address], [place], [place] [address]    Special Requests   Final    NONE Performed at [facility], [place] [address] [place],  [address]    Culture (A)  Final  80,000 COLONIES/mL ESCHERICHIA COLI Confirmed Extended Spectrum Beta-Lactamase Producer (ESBL).  In bloodstream infections from ESBL organisms, carbapenems are preferred over piperacillin/tazobactam. They are shown to have a lower risk of mortality.    Report Status 02/02/2018 FINAL  Final   Organism ID, Bacteria ESCHERICHIA COLI (A)  Final      Susceptibility   Escherichia coli - MIC*    AMPICILLIN >=32 RESISTANT Resistant     CEFAZOLIN >=64 RESISTANT Resistant     CEFTRIAXONE >=64 RESISTANT Resistant     CIPROFLOXACIN >=4 RESISTANT Resistant     GENTAMICIN <=1 SENSITIVE Sensitive     IMIPENEM <=0.25 SENSITIVE Sensitive     NITROFURANTOIN <=16 SENSITIVE Sensitive     TRIMETH/SULFA >=320 RESISTANT Resistant     AMPICILLIN/SULBACTAM >=32 RESISTANT Resistant     PIP/TAZO 16 SENSITIVE Sensitive     Extended ESBL POSITIVE Resistant     * 80,000 COLONIES/mL ESCHERICHIA COLI  Culture, blood (routine x 2)     Status: None   Collection Time: 02/02/18  9:13 AM  Result Value Ref Range Status   Specimen Description   Final    BLOOD RIGHT ARM Performed at  [facility] [address], [place], [place] [address]    Special Requests   Final    BOTTLES DRAWN AEROBIC AND ANAEROBIC Blood Culture adequate volume Performed at [facility] [address], [place], [place] [address]    Culture   Final    NO GROWTH 5 DAYS Performed at [facility], [place] [address], [place], [place] [address]    Report Status 02/07/2018 FINAL  Final  Culture, blood (routine x 2)     Status: None   Collection Time: 02/02/18  9:13 AM  Result Value Ref Range Status   Specimen Description   Final    BLOOD LEFT HAND Performed at [facility] [address] [place], [place] [address]    Special Requests   Final    BOTTLES DRAWN AEROBIC ONLY Blood Culture adequate volume Performed at [facility] [address], [place], [place] [address]    Culture   Final    NO GROWTH 5 DAYS Performed at [facility], [place] [address] [place], [place] [address]    Report Status 02/07/2018 FINAL  Final     Labs: BNP (last 3 results) Recent Labs    02/03/18 1952  BNP 244.9*   Basic Metabolic Panel: Recent Labs  Lab 02/02/18 0531 02/03/18 1952 02/04/18 0812 02/05/18 0807 02/08/18 0316  NA 140 140 140 139 143  K 4.2 3.8 3.7 3.4* 3.9  CL 104 101 100 89* 97*  CO2 27 32 32 37* 33*  GLUCOSE 97 145* 102* 109* 96  BUN '19 20 20 20 22  '$ CREATININE 0.98 0.94 0.93 0.96 0.73  CALCIUM 8.6* 8.8* 8.5* 8.9 8.7*   Liver Function Tests: Recent Labs  Lab 02/03/18 1952 02/04/18 0812  AST 16 16  ALT 9 11  ALKPHOS 56 57  BILITOT 0.3 0.5  PROT 6.3* 6.2*  ALBUMIN 2.6* 2.5*   No results for input(s): LIPASE, AMYLASE in the last 168 hours. No results for input(s): AMMONIA in the last 168 hours. CBC: Recent Labs  Lab 02/02/18 0531 02/03/18 0522 02/03/18 1952 02/04/18 0812  WBC 7.6 11.0* 9.4 8.4  NEUTROABS  --   --  6.4  --   HGB 10.7* 10.9* 11.0* 10.6*  HCT 35.0* 35.9* 35.1* 34.3*  MCV 95.1 95.0 93.6 94.5  PLT 180 171 190 179   Cardiac Enzymes: Recent Labs  Lab 02/02/18 1418 02/03/18 1952  TROPONINI <0.03 <0.03   BNP: Invalid input(s): POCBNP CBG: No results for input(s): GLUCAP in the last 168 hours. D-Dimer No results for input(s): DDIMER in the last 72 hours. Hgb A1c No results for input(s): HGBA1C in the last 72 hours. Lipid Profile No results for input(s): CHOL, HDL, LDLCALC, TRIG, CHOLHDL, LDLDIRECT in the last 72 hours. Thyroid function studies No results for input(s): TSH, T4TOTAL, T3FREE, THYROIDAB in the last 72 hours.  Invalid input(s): FREET3 Anemia work up No results for input(s): VITAMINB12, FOLATE, FERRITIN, TIBC, IRON, RETICCTPCT in the last 72 hours. Urinalysis    Component Value Date/Time   COLORURINE AMBER (A) 02/03/2018 2151   APPEARANCEUR CLOUDY (A) 02/03/2018 2151   LABSPEC 1.020 02/03/2018 2151   PHURINE 5.0 02/03/2018 2151   GLUCOSEU 50 (A) 02/03/2018 2151   GLUCOSEU NEG mg/dL 03/08/2007 2031   HGBUR LARGE (A) 02/03/2018 2151   HGBUR negative 03/06/2008 0846   BILIRUBINUR NEGATIVE 02/03/2018 2151   KETONESUR 5 (A) 02/03/2018 2151   PROTEINUR 100 (A) 02/03/2018 2151   UROBILINOGEN 0.2 09/20/2013 1535   NITRITE NEGATIVE 02/03/2018 2151   LEUKOCYTESUR SMALL (A) 02/03/2018 2151   Sepsis Labs Invalid input(s): PROCALCITONIN,  WBC,  LACTICIDVEN Microbiology Recent Results (from the past 240 hour(s))  Blood Culture (routine x 2)     Status: Abnormal   Collection Time: 01/30/18  8:57 PM  Result Value Ref Range Status   Specimen Description   Final    BLOOD RIGHT WRIST Performed at [facility], [address] [place], [place] [address]    Special Requests   Final    BOTTLES DRAWN AEROBIC AND ANAEROBIC Blood Culture adequate volume Performed at [facility] [address], [place], [place] [address]    Culture  Setup Time   Final    GRAM NEGATIVE RODS IN BOTH AEROBIC AND ANAEROBIC  BOTTLES CRITICAL RESULT CALLED TO, READ BACK BY AND VERIFIED WITH: Karie Kirks PharmD 13:05 01/31/18 ([email or web address]) Performed at [facility], [address] [place], [place] [address]    Culture (A)  Final    ESCHERICHIA COLI Confirmed Extended Spectrum Beta-Lactamase Producer (ESBL).  In bloodstream infections from ESBL organisms, carbapenems are preferred over piperacillin/tazobactam. They are shown to have a lower risk of mortality.    Report Status 02/02/2018 FINAL  Final   Organism ID, Bacteria ESCHERICHIA COLI  Final      Susceptibility   Escherichia coli - MIC*    AMPICILLIN >=32 RESISTANT Resistant     CEFAZOLIN >=64 RESISTANT Resistant     CEFEPIME >=64 RESISTANT Resistant     CEFTAZIDIME >=64 RESISTANT Resistant     CEFTRIAXONE >=64 RESISTANT Resistant     CIPROFLOXACIN >=4 RESISTANT Resistant     GENTAMICIN <=1 SENSITIVE Sensitive     IMIPENEM <=0.25 SENSITIVE Sensitive     TRIMETH/SULFA >=320 RESISTANT Resistant     AMPICILLIN/SULBACTAM >=32 RESISTANT Resistant     PIP/TAZO 16 SENSITIVE Sensitive     Extended ESBL POSITIVE Resistant     * ESCHERICHIA COLI  Blood Culture ID Panel (Reflexed)     Status: Abnormal   Collection Time: 01/30/18  8:57 PM  Result Value Ref Range Status   Enterococcus species NOT DETECTED NOT DETECTED Final   Listeria monocytogenes NOT DETECTED NOT DETECTED Final   Staphylococcus species NOT DETECTED NOT  DETECTED Final   Staphylococcus aureus (BCID) NOT DETECTED NOT DETECTED Final   Streptococcus species NOT DETECTED NOT DETECTED Final   Streptococcus agalactiae NOT DETECTED NOT DETECTED Final   Streptococcus pneumoniae NOT DETECTED NOT DETECTED Final   Streptococcus pyogenes NOT DETECTED NOT DETECTED Final   Acinetobacter baumannii NOT DETECTED NOT DETECTED Final   Enterobacteriaceae species DETECTED (A) NOT DETECTED Final    Comment: Enterobacteriaceae represent a large family of gram-negative bacteria, not a single organism. CRITICAL RESULT  CALLED TO, READ BACK BY AND VERIFIED WITH: Karie Kirks PharmD 13:05 01/31/18 ([email or web address])    Enterobacter cloacae complex NOT DETECTED NOT DETECTED Final   Escherichia coli DETECTED (A) NOT DETECTED Final    Comment: CRITICAL RESULT CALLED TO, READ BACK BY AND VERIFIED WITH: Karie Kirks PharmD 13:05 01/31/18 ([email or web address])    Klebsiella oxytoca NOT DETECTED NOT DETECTED Final   Klebsiella pneumoniae NOT DETECTED NOT DETECTED Final   Proteus species NOT DETECTED NOT DETECTED Final   Serratia marcescens NOT DETECTED NOT DETECTED Final   Carbapenem resistance NOT DETECTED NOT DETECTED Final   Haemophilus influenzae NOT DETECTED NOT DETECTED Final   Neisseria meningitidis NOT DETECTED NOT DETECTED Final   Pseudomonas aeruginosa NOT DETECTED NOT DETECTED Final   Candida albicans NOT DETECTED NOT DETECTED Final   Candida glabrata NOT DETECTED NOT DETECTED Final   Candida krusei NOT DETECTED NOT DETECTED Final   Candida parapsilosis NOT DETECTED NOT DETECTED Final   Candida tropicalis NOT DETECTED NOT DETECTED Final    Comment: Performed at [facility], [place] [address] [place], [place] [address]  Blood Culture (routine x 2)     Status: Abnormal   Collection Time: 01/30/18  9:12 PM  Result Value Ref Range Status   Specimen Description   Final    BLOOD RIGHT ANTECUBITAL Performed at [facility] [address], [place], [place] [address]    Special Requests   Final    BOTTLES DRAWN AEROBIC AND ANAEROBIC Blood Culture adequate volume Performed at [place] [address] [place], [place] [address]    Culture  Setup Time   Final    GRAM NEGATIVE RODS IN BOTH AEROBIC AND ANAEROBIC BOTTLES CRITICAL VALUE NOTED.  VALUE IS CONSISTENT WITH PREVIOUSLY REPORTED AND CALLED VALUE.    Culture (A)  Final    ESCHERICHIA COLI SUSCEPTIBILITIES PERFORMED ON PREVIOUS CULTURE WITHIN THE LAST 5 DAYS. Performed at [facility], [place] [address], [place], [place]  [address]    Report Status 02/02/2018 FINAL  Final  Urine Culture     Status: Abnormal   Collection Time: 01/31/18 12:07 AM  Result Value Ref Range Status   Specimen Description   Final    CYSTOSCOPY URINE Performed at [place] [address], [place], [place] [address]    Special Requests   Final    NONE Performed at [facility], [place] [address], [place], [place] [address]    Culture (A)  Final    >=100,000 COLONIES/mL ESCHERICHIA COLI Confirmed Extended Spectrum Beta-Lactamase Producer (ESBL).  In bloodstream infections from ESBL organisms, carbapenems are preferred over piperacillin/tazobactam. They are shown to have a lower risk of mortality.    Report Status 02/02/2018 FINAL  Final   Organism ID, Bacteria ESCHERICHIA COLI (A)  Final      Susceptibility   Escherichia coli - MIC*    AMPICILLIN >=32 RESISTANT Resistant     CEFAZOLIN >=64 RESISTANT Resistant  CEFEPIME >=64 RESISTANT Resistant     CEFTAZIDIME 16 INTERMEDIATE Intermediate     CEFTRIAXONE >=64 RESISTANT Resistant     CIPROFLOXACIN >=4 RESISTANT Resistant     GENTAMICIN <=1 SENSITIVE Sensitive     IMIPENEM <=0.25 SENSITIVE Sensitive     TRIMETH/SULFA >=320 RESISTANT Resistant     AMPICILLIN/SULBACTAM >=32 RESISTANT Resistant     PIP/TAZO <=4 SENSITIVE Sensitive     Extended ESBL POSITIVE Resistant     * >=100,000 COLONIES/mL ESCHERICHIA COLI  Urine Culture     Status: Abnormal   Collection Time: 01/31/18  1:37 AM  Result Value Ref Range Status   Specimen Description   Final    URINE, CATHETERIZED Performed at [facility] [address] [place], [place] [address]    Special Requests   Final    NONE Performed at [facility], [place] [address] [place], [place] [address]    Culture (A)  Final    80,000 COLONIES/mL ESCHERICHIA COLI Confirmed Extended Spectrum Beta-Lactamase Producer (ESBL).  In bloodstream infections from ESBL  organisms, carbapenems are preferred over piperacillin/tazobactam. They are shown to have a lower risk of mortality.    Report Status 02/02/2018 FINAL  Final   Organism ID, Bacteria ESCHERICHIA COLI (A)  Final      Susceptibility   Escherichia coli - MIC*    AMPICILLIN >=32 RESISTANT Resistant     CEFAZOLIN >=64 RESISTANT Resistant     CEFTRIAXONE >=64 RESISTANT Resistant     CIPROFLOXACIN >=4 RESISTANT Resistant     GENTAMICIN <=1 SENSITIVE Sensitive     IMIPENEM <=0.25 SENSITIVE Sensitive     NITROFURANTOIN <=16 SENSITIVE Sensitive     TRIMETH/SULFA >=320 RESISTANT Resistant     AMPICILLIN/SULBACTAM >=32 RESISTANT Resistant     PIP/TAZO 16 SENSITIVE Sensitive     Extended ESBL POSITIVE Resistant     * 80,000 COLONIES/mL ESCHERICHIA COLI  Culture, blood (routine x 2)     Status: None   Collection Time: 02/02/18  9:13 AM  Result Value Ref Range Status   Specimen Description   Final    BLOOD RIGHT ARM Performed at [place] [address], [place], [place] [address]    Special Requests   Final    BOTTLES DRAWN AEROBIC AND ANAEROBIC Blood Culture adequate volume Performed at [place] [address], [place], [place] [address]    Culture   Final    NO GROWTH 5 DAYS Performed at [facility], [place] [address] [place], [place] [address]    Report Status 02/07/2018 FINAL  Final  Culture, blood (routine x 2)     Status: None   Collection Time: 02/02/18  9:13 AM  Result Value Ref Range Status   Specimen Description   Final    BLOOD LEFT HAND Performed at [place] [address], [place], [place] [address]    Special Requests   Final    BOTTLES DRAWN AEROBIC ONLY Blood Culture adequate volume Performed at [place] [address], [place], [place] [address]    Culture   Final    NO GROWTH 5 DAYS Performed at [facility], [place] [address], [place], [place] [address]    Report  Status 02/07/2018 FINAL  Final     Time coordinating discharge: 40 minutes  SIGNED:   Charlynne Cousins, MD  [facility] 02/08/2018, 9:52 AM Pager   If 7PM-7AM, please contact  night-coverage [email or web address] Password [email or web address]

## 2018-02-09 ENCOUNTER — Encounter (HOSPITAL_COMMUNITY): Payer: Self-pay

## 2018-02-09 ENCOUNTER — Inpatient Hospital Stay (HOSPITAL_COMMUNITY)
Admission: EM | Admit: 2018-02-09 | Discharge: 2018-02-12 | DRG: 871 | Disposition: A | Discharge status: Skilled Nursing Facility | Payer: Medicare Other | Attending: Internal Medicine | Admitting: Internal Medicine

## 2018-02-09 ENCOUNTER — Emergency Department (HOSPITAL_COMMUNITY): Payer: Medicare Other

## 2018-02-09 ENCOUNTER — Non-Acute Institutional Stay (SKILLED_NURSING_FACILITY): Payer: Medicare Other | Admitting: Adult Health

## 2018-02-09 ENCOUNTER — Encounter: Payer: Self-pay | Admitting: Adult Health

## 2018-02-09 DIAGNOSIS — R0602 Shortness of breath: Secondary | ICD-10-CM

## 2018-02-09 DIAGNOSIS — A4151 Sepsis due to Escherichia coli [E. coli]: Secondary | ICD-10-CM | POA: Diagnosis not present

## 2018-02-09 DIAGNOSIS — T82524A Displacement of infusion catheter, initial encounter: Secondary | ICD-10-CM | POA: Diagnosis not present

## 2018-02-09 DIAGNOSIS — N136 Pyonephrosis: Secondary | ICD-10-CM | POA: Diagnosis not present

## 2018-02-09 DIAGNOSIS — I110 Hypertensive heart disease with heart failure: Secondary | ICD-10-CM | POA: Diagnosis present

## 2018-02-09 DIAGNOSIS — R4182 Altered mental status, unspecified: Secondary | ICD-10-CM

## 2018-02-09 DIAGNOSIS — R410 Disorientation, unspecified: Secondary | ICD-10-CM

## 2018-02-09 DIAGNOSIS — Z1612 Extended spectrum beta lactamase (ESBL) resistance: Secondary | ICD-10-CM | POA: Diagnosis not present

## 2018-02-09 DIAGNOSIS — G9341 Metabolic encephalopathy: Secondary | ICD-10-CM | POA: Diagnosis not present

## 2018-02-09 DIAGNOSIS — Z7982 Long term (current) use of aspirin: Secondary | ICD-10-CM

## 2018-02-09 DIAGNOSIS — Z452 Encounter for adjustment and management of vascular access device: Secondary | ICD-10-CM

## 2018-02-09 DIAGNOSIS — F0390 Unspecified dementia without behavioral disturbance: Secondary | ICD-10-CM | POA: Diagnosis present

## 2018-02-09 DIAGNOSIS — R4020 Unspecified coma: Secondary | ICD-10-CM | POA: Diagnosis not present

## 2018-02-09 DIAGNOSIS — Z96641 Presence of right artificial hip joint: Secondary | ICD-10-CM | POA: Diagnosis present

## 2018-02-09 DIAGNOSIS — E119 Type 2 diabetes mellitus without complications: Secondary | ICD-10-CM | POA: Diagnosis present

## 2018-02-09 DIAGNOSIS — J9621 Acute and chronic respiratory failure with hypoxia: Secondary | ICD-10-CM

## 2018-02-09 DIAGNOSIS — N132 Hydronephrosis with renal and ureteral calculous obstruction: Secondary | ICD-10-CM | POA: Diagnosis present

## 2018-02-09 DIAGNOSIS — T829XXA Unspecified complication of cardiac and vascular prosthetic device, implant and graft, initial encounter: Secondary | ICD-10-CM | POA: Diagnosis present

## 2018-02-09 DIAGNOSIS — N390 Urinary tract infection, site not specified: Secondary | ICD-10-CM

## 2018-02-09 DIAGNOSIS — Y712 Prosthetic and other implants, materials and accessory cardiovascular devices associated with adverse incidents: Secondary | ICD-10-CM | POA: Diagnosis present

## 2018-02-09 DIAGNOSIS — G9340 Encephalopathy, unspecified: Secondary | ICD-10-CM | POA: Diagnosis present

## 2018-02-09 DIAGNOSIS — Z79891 Long term (current) use of opiate analgesic: Secondary | ICD-10-CM

## 2018-02-09 DIAGNOSIS — E785 Hyperlipidemia, unspecified: Secondary | ICD-10-CM | POA: Diagnosis present

## 2018-02-09 DIAGNOSIS — I482 Chronic atrial fibrillation, unspecified: Secondary | ICD-10-CM | POA: Diagnosis not present

## 2018-02-09 DIAGNOSIS — A499 Bacterial infection, unspecified: Secondary | ICD-10-CM | POA: Diagnosis present

## 2018-02-09 DIAGNOSIS — I5030 Unspecified diastolic (congestive) heart failure: Secondary | ICD-10-CM | POA: Diagnosis present

## 2018-02-09 DIAGNOSIS — J90 Pleural effusion, not elsewhere classified: Secondary | ICD-10-CM | POA: Diagnosis not present

## 2018-02-09 DIAGNOSIS — I10 Essential (primary) hypertension: Secondary | ICD-10-CM | POA: Diagnosis present

## 2018-02-09 DIAGNOSIS — Z79899 Other long term (current) drug therapy: Secondary | ICD-10-CM

## 2018-02-09 DIAGNOSIS — G894 Chronic pain syndrome: Secondary | ICD-10-CM | POA: Diagnosis present

## 2018-02-09 DIAGNOSIS — B9629 Other Escherichia coli [E. coli] as the cause of diseases classified elsewhere: Secondary | ICD-10-CM

## 2018-02-09 DIAGNOSIS — R310 Gross hematuria: Secondary | ICD-10-CM | POA: Diagnosis present

## 2018-02-09 DIAGNOSIS — I714 Abdominal aortic aneurysm, without rupture: Secondary | ICD-10-CM | POA: Diagnosis present

## 2018-02-09 DIAGNOSIS — Z96651 Presence of right artificial knee joint: Secondary | ICD-10-CM | POA: Diagnosis present

## 2018-02-09 DIAGNOSIS — Z882 Allergy status to sulfonamides status: Secondary | ICD-10-CM

## 2018-02-09 DIAGNOSIS — G4733 Obstructive sleep apnea (adult) (pediatric): Secondary | ICD-10-CM | POA: Diagnosis present

## 2018-02-09 LAB — BASIC METABOLIC PANEL
Anion gap: 14 (ref 5–15)
BUN: 20 mg/dL (ref 8–23)
CO2: 27 mmol/L (ref 22–32)
Calcium: 8.7 mg/dL — ABNORMAL LOW (ref 8.9–10.3)
Chloride: 102 mmol/L (ref 98–111)
Creatinine, Ser: 0.76 mg/dL (ref 0.44–1.00)
GFR calc Af Amer: >60 mL/min (ref >60)
GFR calc non Af Amer: >60 mL/min (ref >60)
Glucose, Bld: 107 mg/dL — ABNORMAL HIGH (ref 70–99)
Potassium: 3.2 mmol/L — ABNORMAL LOW (ref 3.5–5.1)
Sodium: 143 mmol/L (ref 135–145)

## 2018-02-09 LAB — CBC WITH DIFFERENTIAL/PLATELET
Abs Immature Granulocytes: 0.09 10*3/uL — ABNORMAL HIGH (ref 0.00–0.07)
Basophils Absolute: 0.1 10*3/uL (ref 0.0–0.1)
Basophils Relative: 1 %
Eosinophils Absolute: 0 10*3/uL (ref 0.0–0.5)
Eosinophils Relative: 0 %
HCT: 35.2 % — ABNORMAL LOW (ref 36.0–46.0)
Hemoglobin: 11 g/dL — ABNORMAL LOW (ref 12.0–15.0)
Immature Granulocytes: 1 %
Lymphocytes Relative: 13 %
Lymphs Abs: 1.5 10*3/uL (ref 0.7–4.0)
MCH: 28.9 pg (ref 26.0–34.0)
MCHC: 31.3 g/dL (ref 30.0–36.0)
MCV: 92.6 fL (ref 80.0–100.0)
Monocytes Absolute: 0.7 10*3/uL (ref 0.1–1.0)
Monocytes Relative: 6 %
Neutro Abs: 9.5 10*3/uL — ABNORMAL HIGH (ref 1.7–7.7)
Neutrophils Relative %: 79 %
Platelets: 268 10*3/uL (ref 150–400)
RBC: 3.8 MIL/uL — ABNORMAL LOW (ref 3.87–5.11)
RDW: 13 % (ref 11.5–15.5)
WBC: 11.8 10*3/uL — ABNORMAL HIGH (ref 4.0–10.5)
nRBC: 0 % (ref 0.0–0.2)

## 2018-02-09 LAB — COMPREHENSIVE METABOLIC PANEL
ALT: 12 U/L (ref 0–44)
AST: 21 U/L (ref 15–41)
Albumin: 2.8 g/dL — ABNORMAL LOW (ref 3.5–5.0)
Alkaline Phosphatase: 60 U/L (ref 38–126)
Anion gap: 13 (ref 5–15)
BUN: 18 mg/dL (ref 8–23)
CO2: 25 mmol/L (ref 22–32)
Calcium: 8.9 mg/dL (ref 8.9–10.3)
Chloride: 105 mmol/L (ref 98–111)
Creatinine, Ser: 0.75 mg/dL (ref 0.44–1.00)
GFR calc Af Amer: >60 mL/min (ref >60)
GFR calc non Af Amer: >60 mL/min (ref >60)
Glucose, Bld: 111 mg/dL — ABNORMAL HIGH (ref 70–99)
Potassium: 3.6 mmol/L (ref 3.5–5.1)
Sodium: 143 mmol/L (ref 135–145)
Total Bilirubin: 1.4 mg/dL — ABNORMAL HIGH (ref 0.3–1.2)
Total Protein: 6.9 g/dL (ref 6.5–8.1)

## 2018-02-09 LAB — BLOOD GAS, ARTERIAL
Acid-Base Excess: 2.2 mmol/L — ABNORMAL HIGH (ref 0.0–2.0)
Bicarbonate: 24.8 mmol/L (ref 20.0–28.0)
FIO2: 0.21
O2 Saturation: 90.4 %
Patient temperature: 98.6
Sample type: 270211
pCO2 arterial: 33 mmHg (ref 32.0–48.0)
pH, Arterial: 7.489 — ABNORMAL HIGH (ref 7.350–7.450)
pO2, Arterial: 57.3 mmHg — ABNORMAL LOW (ref 83.0–108.0)

## 2018-02-09 LAB — URINALYSIS, ROUTINE W REFLEX MICROSCOPIC
Bacteria, UA: NONE SEEN
Bilirubin Urine: NEGATIVE
Glucose, UA: 50 mg/dL — AB
Ketones, ur: 80 mg/dL — AB
Leukocytes, UA: NEGATIVE
Nitrite: NEGATIVE
Protein, ur: 100 mg/dL — AB
RBC / HPF: >50 RBC/hpf — ABNORMAL HIGH (ref 0–5)
Specific Gravity, Urine: 1.016 (ref 1.005–1.030)
pH: 6 (ref 5.0–8.0)

## 2018-02-09 LAB — BRAIN NATRIURETIC PEPTIDE: B Natriuretic Peptide: 720.4 pg/mL — ABNORMAL HIGH (ref 0.0–100.0)

## 2018-02-09 MED ORDER — HALOPERIDOL LACTATE 5 MG/ML IJ SOLN
1.0000 mg | Freq: Four times a day (QID) | INTRAMUSCULAR | Status: DC | PRN
  Administered 2018-02-09: 1 mg via INTRAVENOUS
  Filled 2018-02-09: qty 1

## 2018-02-09 MED ORDER — POTASSIUM CHLORIDE CRYS ER 20 MEQ PO TBCR
40.0000 meq | EXTENDED_RELEASE_TABLET | Freq: Two times a day (BID) | ORAL | Status: DC
  Administered 2018-02-09: 40 meq via ORAL
  Filled 2018-02-09: qty 2

## 2018-02-09 MED ORDER — HEPARIN SOD (PORK) LOCK FLUSH 100 UNIT/ML IV SOLN
250.0000 [IU] | INTRAVENOUS | Status: AC | PRN
  Administered 2018-02-09: 250 [IU]

## 2018-02-09 MED ORDER — SODIUM CHLORIDE 0.9 % IV SOLN
INTRAVENOUS | Status: AC
  Administered 2018-02-09: 06:00:00 via INTRAVENOUS

## 2018-02-09 MED ORDER — SODIUM CHLORIDE 0.9 % IV SOLN: INTRAVENOUS | Status: DC

## 2018-02-09 MED ORDER — SODIUM CHLORIDE 0.9 % IV BOLUS
250.0000 mL | Freq: Once | INTRAVENOUS | Status: AC
  Administered 2018-02-09: 250 mL via INTRAVENOUS

## 2018-02-09 NOTE — ED Notes (Signed)
IV team called back and said the PT PICC line will need to be replaced, cannot be done until the morning. I paged dr. Myna Hidalgo and requested new order. I called pt receiving rn HUI and let her know the same

## 2018-02-09 NOTE — Progress Notes (Signed)
[facility] PROGRESS NOTE    Progress Note  [name]  TKZ:[id number] DOB: 09/21/1933 DOA: 02/03/2018 PCP: [name], MD     Brief Narrative:   [name] is an 84 y.o. female past medical history of significant of diabetes, hydronephrosis with a UTI, chronic atrial fibrillation not on to correlation due to falls, and abdominal aortic aneurysm chronic pain syndrome and dementia who was just discharged home on the day of admission after 4-day hospital stay due to an ESBL E. coli UTI she had been on IV meropenem while at home home care found her saturations to be in the 70s and she was confused, brought to the ED on hypoxia with worsening right sided pleural effusion  Assessment/Plan:   Acute on chronic respiratory failure with hypoxia and hypercarbia ([place]) Likely due to medications which were held now is much improved. Awaiting placement.  Acute encephalopathy The setting of narcotics and high-dose gabapentin.  Which has resolved  Urinary tract infection due to extended-spectrum beta lactamase (ESBL) producing  Escherichia coli/nephrolithiasis/hydronephrosis: On IV INvanz until 02/14/2018 PICC line in place. Awaiting placement.  Chronic atrial fibrillation: Not on anticoagulation due to falls. Continue Tylenol rate control.  Hyperlipidemia: Continue Crestor.  Essential hypertension: Continue atenolol.  Dementia: Only alert to person.  Gestation of bedbugs: Gust with family.  Sleep apnea: Continue CPAP.  Infestation by bed bug Discussed with family.   DVT prophylaxis: lovenox Family Communication:son Disposition Plan/Barrier to D/C: Awaiting skilled nursing facility placement. Code Status:     Code Status Orders  (From admission, onward)         Start     Ordered   02/04/18 0000  Full code  Continuous     02/04/18 0000        Code Status History    Date Active Date Inactive Code Status Order ID Comments User Context   01/30/2018  2307 02/03/2018 1713 Full Code [id number]  Rise Patience, MD ED   09/04/2017 1601 09/08/2017 1427 Full Code [id number]  Cristal Ford, DO ED   07/03/2017 0438 07/05/2017 2017 Full Code [id number]  Norval Morton, MD ED   02/02/2017 0159 02/05/2017 1953 Full Code [id number]  Etta Quill, DO ED        IV Access:    Peripheral IV   Procedures and diagnostic studies:   No results found.   Medical Consultants:    None.  Anti-Infectives:   Invanz  Subjective:    Johnn Hai she is awake today able to carry on a conversation still complaining of mild pain. Objective:    Vitals:   02/08/18 1300 02/08/18 2013 02/09/18 0352 02/09/18 0734  BP: 120/82 126/77 (!) 138/94 (!) 149/104  Pulse: (!) 106 (!) 103 (!) 108 (!) 114  Resp: 20     Temp: 98.5 F (36.9 C)  98.4 F (36.9 C)   TempSrc: Oral  Oral   SpO2: 93% 99% 92% 95%  Weight:      Height:        Intake/Output Summary (Last 24 hours) at 02/09/2018 0828 Last data filed at 02/09/2018 0601 Gross per 24 hour  Intake 555 ml  Output 350 ml  Net 205 ml   Filed Weights   02/04/18 0002  Weight: 79.9 kg    Exam: General exam: In no acute distress. Respiratory system: Good air movement and clear to auscultation. Cardiovascular system: S1 & S2 heard, RRR.  Gastrointestinal system: Abdomen is nondistended, soft and nontender.  Central nervous system: Alert and oriented. No focal neurological deficits. Extremities: No pedal edema. Skin: No rashes, lesions or ulcers Psychiatry: [name] only responsive to pain.   Data Reviewed:    Labs: Basic Metabolic Panel: Recent Labs  Lab 02/03/18 1952 02/04/18 0812 02/05/18 0807 02/08/18 0316 02/09/18 0521  NA 140 140 139 143 143  K 3.8 3.7 3.4* 3.9 3.2*  CL 101 100 89* 97* 102  CO2 32 32 37* 33* 27  GLUCOSE 145* 102* 109* 96 107*  BUN 20 20 20 22 20   CREATININE 0.94 0.93 0.96 0.73 0.76  CALCIUM 8.8* 8.5* 8.9 8.7* 8.7*   GFR Estimated Creatinine  Clearance: 55.5 mL/min (by C-G formula based on SCr of 0.76 mg/dL). Liver Function Tests: Recent Labs  Lab 02/03/18 1952 02/04/18 0812  AST 16 16  ALT 9 11  ALKPHOS 56 57  BILITOT 0.3 0.5  PROT 6.3* 6.2*  ALBUMIN 2.6* 2.5*   No results for input(s): LIPASE, AMYLASE in the last 168 hours. No results for input(s): AMMONIA in the last 168 hours. Coagulation profile No results for input(s): INR, PROTIME in the last 168 hours.  CBC: Recent Labs  Lab 02/03/18 0522 02/03/18 1952 02/04/18 0812  WBC 11.0* 9.4 8.4  NEUTROABS  --  6.4  --   HGB 10.9* 11.0* 10.6*  HCT 35.9* 35.1* 34.3*  MCV 95.0 93.6 94.5  PLT 171 190 179   Cardiac Enzymes: Recent Labs  Lab 02/02/18 1418 02/03/18 1952  TROPONINI <0.03 <0.03   BNP (last 3 results) No results for input(s): PROBNP in the last 8760 hours. CBG: No results for input(s): GLUCAP in the last 168 hours. D-Dimer: No results for input(s): DDIMER in the last 72 hours. Hgb A1c: No results for input(s): HGBA1C in the last 72 hours. Lipid Profile: No results for input(s): CHOL, HDL, LDLCALC, TRIG, CHOLHDL, LDLDIRECT in the last 72 hours. Thyroid function studies: No results for input(s): TSH, T4TOTAL, T3FREE, THYROIDAB in the last 72 hours.  Invalid input(s): FREET3 Anemia work up: No results for input(s): VITAMINB12, FOLATE, FERRITIN, TIBC, IRON, RETICCTPCT in the last 72 hours. Sepsis Labs: Recent Labs  Lab 02/03/18 0522 02/03/18 1952 02/04/18 0812  WBC 11.0* 9.4 8.4   Microbiology Recent Results (from the past 240 hour(s))  Blood Culture (routine x 2)     Status: Abnormal   Collection Time: 01/30/18  8:57 PM  Result Value Ref Range Status   Specimen Description   Final    BLOOD RIGHT WRIST Performed at [facility], [address], [place], [place] [address]    Special Requests   Final    BOTTLES DRAWN AEROBIC AND ANAEROBIC Blood Culture adequate volume Performed at [place]  [address] [place], [place] [address]    Culture  Setup Time   Final    GRAM NEGATIVE RODS IN BOTH AEROBIC AND ANAEROBIC BOTTLES CRITICAL RESULT CALLED TO, READ BACK BY AND VERIFIED WITH: Karie Kirks PharmD 13:05 01/31/18 ([email or web address]) Performed at [facility], [address] [place], [place] [address]    Culture (A)  Final    ESCHERICHIA COLI Confirmed Extended Spectrum Beta-Lactamase Producer (ESBL).  In bloodstream infections from ESBL organisms, carbapenems are preferred over piperacillin/tazobactam. They are shown to have a lower risk of mortality.    Report Status 02/02/2018 FINAL  Final   Organism ID, Bacteria ESCHERICHIA COLI  Final      Susceptibility   Escherichia coli - MIC*  AMPICILLIN >=32 RESISTANT Resistant     CEFAZOLIN >=64 RESISTANT Resistant     CEFEPIME >=64 RESISTANT Resistant     CEFTAZIDIME >=64 RESISTANT Resistant     CEFTRIAXONE >=64 RESISTANT Resistant     CIPROFLOXACIN >=4 RESISTANT Resistant     GENTAMICIN <=1 SENSITIVE Sensitive     IMIPENEM <=0.25 SENSITIVE Sensitive     TRIMETH/SULFA >=320 RESISTANT Resistant     AMPICILLIN/SULBACTAM >=32 RESISTANT Resistant     PIP/TAZO 16 SENSITIVE Sensitive     Extended ESBL POSITIVE Resistant     * ESCHERICHIA COLI  Blood Culture ID Panel (Reflexed)     Status: Abnormal   Collection Time: 01/30/18  8:57 PM  Result Value Ref Range Status   Enterococcus species NOT DETECTED NOT DETECTED Final   Listeria monocytogenes NOT DETECTED NOT DETECTED Final   Staphylococcus species NOT DETECTED NOT DETECTED Final   Staphylococcus aureus (BCID) NOT DETECTED NOT DETECTED Final   Streptococcus species NOT DETECTED NOT DETECTED Final   Streptococcus agalactiae NOT DETECTED NOT DETECTED Final   Streptococcus pneumoniae NOT DETECTED NOT DETECTED Final   Streptococcus pyogenes NOT DETECTED NOT DETECTED Final   Acinetobacter baumannii NOT DETECTED NOT DETECTED Final   Enterobacteriaceae species DETECTED (A) NOT DETECTED Final      Comment: Enterobacteriaceae represent a large family of gram-negative bacteria, not a single organism. CRITICAL RESULT CALLED TO, READ BACK BY AND VERIFIED WITH: Karie Kirks PharmD 13:05 01/31/18 ([email or web address])    Enterobacter cloacae complex NOT DETECTED NOT DETECTED Final   Escherichia coli DETECTED (A) NOT DETECTED Final    Comment: CRITICAL RESULT CALLED TO, READ BACK BY AND VERIFIED WITH: Karie Kirks PharmD 13:05 01/31/18 ([email or web address])    Klebsiella oxytoca NOT DETECTED NOT DETECTED Final   Klebsiella pneumoniae NOT DETECTED NOT DETECTED Final   Proteus species NOT DETECTED NOT DETECTED Final   Serratia marcescens NOT DETECTED NOT DETECTED Final   Carbapenem resistance NOT DETECTED NOT DETECTED Final   Haemophilus influenzae NOT DETECTED NOT DETECTED Final   Neisseria meningitidis NOT DETECTED NOT DETECTED Final   Pseudomonas aeruginosa NOT DETECTED NOT DETECTED Final   Candida albicans NOT DETECTED NOT DETECTED Final   Candida glabrata NOT DETECTED NOT DETECTED Final   Candida krusei NOT DETECTED NOT DETECTED Final   Candida parapsilosis NOT DETECTED NOT DETECTED Final   Candida tropicalis NOT DETECTED NOT DETECTED Final    Comment: Performed at [facility], [place] [address] [place], [place] [address]  Blood Culture (routine x 2)     Status: Abnormal   Collection Time: 01/30/18  9:12 PM  Result Value Ref Range Status   Specimen Description   Final    BLOOD RIGHT ANTECUBITAL Performed at [place] [address], [place], [place] [address]    Special Requests   Final    BOTTLES DRAWN AEROBIC AND ANAEROBIC Blood Culture adequate volume Performed at [place] [address] [place], [place] [address]    Culture  Setup Time   Final    GRAM NEGATIVE RODS IN BOTH AEROBIC AND ANAEROBIC BOTTLES CRITICAL VALUE NOTED.  VALUE IS CONSISTENT WITH PREVIOUSLY REPORTED AND CALLED VALUE.    Culture (A)  Final    ESCHERICHIA COLI SUSCEPTIBILITIES  PERFORMED ON PREVIOUS CULTURE WITHIN THE LAST 5 DAYS. Performed at [facility], [place] [address], [place], [place] [address]    Report Status 02/02/2018 FINAL  Final  Urine Culture     Status: Abnormal  Collection Time: 01/31/18 12:07 AM  Result Value Ref Range Status   Specimen Description   Final    CYSTOSCOPY URINE Performed at [place] [address], [place], [place] [address]    Special Requests   Final    NONE Performed at [facility], [place] [address] [place], [place] [address]    Culture (A)  Final    >=100,000 COLONIES/mL ESCHERICHIA COLI Confirmed Extended Spectrum Beta-Lactamase Producer (ESBL).  In bloodstream infections from ESBL organisms, carbapenems are preferred over piperacillin/tazobactam. They are shown to have a lower risk of mortality.    Report Status 02/02/2018 FINAL  Final   Organism ID, Bacteria ESCHERICHIA COLI (A)  Final      Susceptibility   Escherichia coli - MIC*    AMPICILLIN >=32 RESISTANT Resistant     CEFAZOLIN >=64 RESISTANT Resistant     CEFEPIME >=64 RESISTANT Resistant     CEFTAZIDIME 16 INTERMEDIATE Intermediate     CEFTRIAXONE >=64 RESISTANT Resistant     CIPROFLOXACIN >=4 RESISTANT Resistant     GENTAMICIN <=1 SENSITIVE Sensitive     IMIPENEM <=0.25 SENSITIVE Sensitive     TRIMETH/SULFA >=320 RESISTANT Resistant     AMPICILLIN/SULBACTAM >=32 RESISTANT Resistant     PIP/TAZO <=4 SENSITIVE Sensitive     Extended ESBL POSITIVE Resistant     * >=100,000 COLONIES/mL ESCHERICHIA COLI  Urine Culture     Status: Abnormal   Collection Time: 01/31/18  1:37 AM  Result Value Ref Range Status   Specimen Description   Final    URINE, CATHETERIZED Performed at [place] [address], [place], [place] [address]    Special Requests   Final    NONE Performed at [facility], [place] [address], [place], [place] [address]    Culture (A)  Final    80,000  COLONIES/mL ESCHERICHIA COLI Confirmed Extended Spectrum Beta-Lactamase Producer (ESBL).  In bloodstream infections from ESBL organisms, carbapenems are preferred over piperacillin/tazobactam. They are shown to have a lower risk of mortality.    Report Status 02/02/2018 FINAL  Final   Organism ID, Bacteria ESCHERICHIA COLI (A)  Final      Susceptibility   Escherichia coli - MIC*    AMPICILLIN >=32 RESISTANT Resistant     CEFAZOLIN >=64 RESISTANT Resistant     CEFTRIAXONE >=64 RESISTANT Resistant     CIPROFLOXACIN >=4 RESISTANT Resistant     GENTAMICIN <=1 SENSITIVE Sensitive     IMIPENEM <=0.25 SENSITIVE Sensitive     NITROFURANTOIN <=16 SENSITIVE Sensitive     TRIMETH/SULFA >=320 RESISTANT Resistant     AMPICILLIN/SULBACTAM >=32 RESISTANT Resistant     PIP/TAZO 16 SENSITIVE Sensitive     Extended ESBL POSITIVE Resistant     * 80,000 COLONIES/mL ESCHERICHIA COLI  Culture, blood (routine x 2)     Status: None   Collection Time: 02/02/18  9:13 AM  Result Value Ref Range Status   Specimen Description   Final    BLOOD RIGHT ARM Performed at [facility] [address] [place], [place] [address]    Special Requests   Final    BOTTLES DRAWN AEROBIC AND ANAEROBIC Blood Culture adequate volume Performed at [facility] [address], [place], [place] [address]    Culture   Final    NO GROWTH 5 DAYS Performed at [facility], [place] [address], [place],  [address]    Report Status 02/07/2018 FINAL  Final  Culture, blood (routine x 2)     Status: None   Collection Time: 02/02/18  9:13 AM  Result Value Ref Range Status   Specimen Description   Final    BLOOD LEFT HAND Performed at [facility] [address] [place], [place] [address]    Special Requests   Final    BOTTLES DRAWN AEROBIC ONLY Blood Culture adequate volume Performed at [place] [address] [place], [place] [address]      Culture   Final    NO GROWTH 5 DAYS Performed at [facility], [place] [address], [place], [place] [address]    Report Status 02/07/2018 FINAL  Final     Medications:   . aspirin  81 mg Oral Daily  . atenolol  50 mg Oral Daily  . DULoxetine  90 mg Oral q morning - 10a  . enoxaparin (LOVENOX) injection  40 mg Subcutaneous Daily  . rosuvastatin  40 mg Oral q1800   Continuous Infusions: . sodium chloride 110 mL/hr at 02/09/18 0556  . ertapenem 1,000 mg (02/09/18 0139)      LOS: 5 days   [facility] Pager [phone]  *Please refer to [email or web address], password [email or web address] to get updated schedule on who will round on this patient, as hospitalists switch teams weekly. If 7PM-7AM, please contact night-coverage at [email or web address], password [email or web address] for any overnight needs.  02/09/2018, 8:28 AM

## 2018-02-09 NOTE — ED Notes (Signed)
Respiratory called for ABG

## 2018-02-09 NOTE — ED Provider Notes (Signed)
[facility] DEPT Provider Note   CSN: [id number] Arrival date & time: 02/09/18  1521     History   Chief Complaint No chief complaint on file.   HPI [name] is a 84 y.o. female.  Patient is discharged today after being admitted from October 5.  Was discharged to [facility] where she was going for rehab.  Patient's admission was for shortness of breath and low oxygen sats.  Patient also had altered mental status which apparently improved while in the hospital when they held her meds.  Patient's been struggling with a significant urinary tract infection as a PICC line in place to receives specific antibiotics for an E. coli infection.  That was to continue at the nursing facility.  Patient was not discharged on oxygen.  Family is concerned that patient is no better if not worse from when she was in the hospital.  Brought her back for reevaluation when she gets nursing facility she was noted to have a lot of shortness of breath.     Past Medical History:  Diagnosis Date  . [name] (abdominal aortic aneurysm) ([place]) 09/2013   3.6 cm  . Atrial fibrillation ([place])   . Dementia ([place])   . Diabetes mellitus    pre-diabetes  . Diverticulosis   . Hyperlipidemia   . Hypertension   . Nephrolithiasis   . OSA on CPAP   . Osteoarthritis   . Osteopenia    T=  -2.1 in hip  . Rheumatoid arthritis(714.0)   . S/P carpal tunnel release   . Spinal stenosis     Patient Active Problem List   Diagnosis Date Noted  . Acute delirium 02/09/2018  . Acute encephalopathy 02/09/2018  . Acute hypoxemic respiratory failure ([place]) 02/05/2018  . Acute on chronic respiratory failure with hypoxia ([place]) 02/03/2018  . Infestation by bed bug 02/03/2018  . Urinary tract infection due to extended-spectrum beta lactamase (ESBL) producing Escherichia coli 02/02/2018  . Chronic a-fib 02/02/2018  . Acute pyelonephritis 01/31/2018  . Constipation 01/31/2018  .  Urolithiasis 01/30/2018  . Hydronephrosis with renal and ureteral calculus obstruction 01/30/2018  . Fall   . T12 compression fracture ([place]) 09/04/2017  . E coli bacteremia 07/03/2017  . Fall at home, initial encounter 07/03/2017  . Tachycardia 02/03/2017  . Sepsis secondary to UTI ([place]) 02/02/2017  . Chronic pain syndrome 02/02/2017  . Abnormal EKG 11/21/2013  . [name] (abdominal aortic aneurysm) ([place]) 09/30/2013  . OSA (obstructive sleep apnea) 06/29/2012  . Dementia ([place]) 06/29/2012  . Pre-diabetes 06/29/2012  . Hypoxia, sleep related 06/29/2012  . RA (rheumatoid arthritis) ([place]) 06/29/2012  . Nonspecific abnormal results of cardiovascular function study 06/02/2011  . Nonspecific abnormal electrocardiogram (ECG) (EKG) 05/18/2011  . OSTEOPOROSIS 04/09/2008  . FLANK PAIN, RIGHT 03/04/2008  . Hyperlipidemia 05/12/2006  . PYELONEPHRITIS, ACUTE 05/12/2006  . DEPENDENT EDEMA, LEGS, BILATERAL 05/12/2006  . URINARY INCONTINENCE 05/12/2006  . PERIPHERAL NEUROPATHY 03/11/2006  . Essential hypertension 03/11/2006  . DYSURIA 03/11/2006    Past Surgical History:  Procedure Laterality Date  . APPENDECTOMY    . CARPAL TUNNEL RELEASE  12/27/2006   Right subcutaneous ulnar nerve transfer -- Right open carpal tunnel release  . CHOLECYSTECTOMY    . CYSTOSCOPY W/ URETERAL STENT PLACEMENT Right 01/30/2018   Procedure: CYSTOSCOPY WITH STENT REPLACEMENT retrograde pylegram;  Surgeon: Ardis Hughs, MD;  Location: [facility];  Service: Urology;  Laterality: Right;  . FOOT SURGERY    . JOINT REPLACEMENT  BTKR, RTHR  . TOTAL KNEE ARTHROPLASTY  08/21/2007   Right total knee replacement     OB History   None      Home Medications    Prior to Admission medications   Medication Sig Start Date End Date Taking? Authorizing Provider  aspirin EC 81 MG tablet Take 81 mg by mouth daily.   Yes [provider]  atenolol (TENORMIN) 50 MG tablet Take 1 tablet (50 mg total) by mouth daily.  08/24/17  Yes [name], MD  DULoxetine (CYMBALTA) 30 MG capsule Take 90 mg by mouth every morning.  07/29/14  Yes [provider]  ertapenem (INVANZ) IVPB Inject 1 g into the vein daily for 12 days. Indication:  Bacteremia Last Day of Therapy:  02/14/2018 Labs - Once weekly:  CBC/D and BMP, Labs - Every other week:  ESR and CRP 02/03/18 02/15/18 Yes Georgette Shell, MD  rosuvastatin (CRESTOR) 40 MG tablet TAKE 1 TABLET BY MOUTH EVERY DAY Patient taking differently: Take 40 mg by mouth daily.  12/25/17  Yes [name], MD  gabapentin (NEURONTIN) 400 MG capsule Take 1 capsule (400 mg total) by mouth at bedtime. 02/08/18 02/08/19  Charlynne Cousins, MD  oxymorphone (OPANA) 5 MG tablet Take 1 tablet (5 mg total) by mouth 3 (three) times daily. 02/08/18   Charlynne Cousins, MD    Family History Family History  Problem Relation Age of Onset  . Stroke Father   . Coronary artery disease Son        CABG at age 68  . Heart disease Son   . Heart attack Son   . Stroke Mother   . Hypertension Sister   . Diabetes Brother   . Hypertension Maternal Aunt     Social History Social History   Tobacco Use  . Smoking status: Never Smoker  . Smokeless tobacco: Never Used  Substance Use Topics  . Alcohol use: No  . Drug use: No     Allergies   Sulfonamide derivatives   Review of Systems Review of Systems  Unable to perform ROS: Mental status change     Physical Exam Updated Vital Signs BP 123/88 (BP Location: Left Arm)   Pulse (!) 108   Temp 99.2 F (37.3 C) (Oral)   Resp (!) 27   SpO2 92%   Physical Exam  Constitutional: She appears well-developed and well-nourished. No distress.  HENT:  Head: Normocephalic and atraumatic.  Mouth/Throat: Oropharynx is clear and moist.  Eyes: Pupils are equal, round, and reactive to light. Conjunctivae and EOM are normal.  Neck: Normal range of motion. Neck supple.  Cardiovascular: Normal rate, regular rhythm and  normal heart sounds.  Pulmonary/Chest: Effort normal and breath sounds normal. No respiratory distress.  Abdominal: Soft. Bowel sounds are normal. There is no tenderness.  Musculoskeletal: Normal range of motion. She exhibits no edema.  Neurological: She is alert. No cranial nerve deficit or sensory deficit. She exhibits normal muscle tone. Coordination normal.  Skin: Skin is warm.  Nursing note and vitals reviewed.    ED Treatments / Results  Labs (all labs ordered are listed, but only abnormal results are displayed) Labs Reviewed  CBC WITH DIFFERENTIAL/PLATELET - Abnormal; Notable for the following components:      Result Value   WBC 11.8 (*)    RBC 3.80 (*)    Hemoglobin 11.0 (*)    HCT 35.2 (*)    Neutro Abs 9.5 (*)    Abs Immature  Granulocytes 0.09 (*)    All other components within normal limits  COMPREHENSIVE METABOLIC PANEL - Abnormal; Notable for the following components:   Glucose, Bld 111 (*)    Albumin 2.8 (*)    Total Bilirubin 1.4 (*)    All other components within normal limits  URINALYSIS, ROUTINE W REFLEX MICROSCOPIC - Abnormal; Notable for the following components:   APPearance CLOUDY (*)    Glucose, UA 50 (*)    Hgb urine dipstick LARGE (*)    Ketones, ur 80 (*)    Protein, ur 100 (*)    RBC / HPF >50 (*)    All other components within normal limits  BRAIN NATRIURETIC PEPTIDE - Abnormal; Notable for the following components:   B Natriuretic Peptide 720.4 (*)    All other components within normal limits  BLOOD GAS, ARTERIAL - Abnormal; Notable for the following components:   pH, Arterial 7.489 (*)    pO2, Arterial 57.3 (*)    Acid-Base Excess 2.2 (*)    All other components within normal limits    EKG None  Radiology Dg Chest 2 View  Result Date: 02/09/2018 CLINICAL DATA:  Mental status change.  Shortness of breath. EXAM: CHEST - 2 VIEW COMPARISON:  Chest x-rays dated 02/05/2018 and 02/03/2018. FINDINGS: Stable cardiomegaly. Stable small  bilateral pleural effusions. Probable associated mild edema within the RIGHT lower lobe. No pneumothorax seen. RIGHT-sided PICC line is incompletely visualized, tip directed into the lower RIGHT neck. IMPRESSION: 1. PICC line has been displaced since the previous chest x-ray, with tip now directed upwards in the RIGHT neck (tip presumably in the RIGHT internal jugular vein). Recommend repositioning. 2. Stable small bilateral pleural effusions. Probable associated mild edema within the RIGHT lower lobe. 3. Stable cardiomegaly. Electronically Signed   By: Franki Cabot M.D.   On: 02/09/2018 17:45   Ct Head Wo Contrast  Result Date: 02/09/2018 CLINICAL DATA:  Confusion and delirium with altered level of consciousness. EXAM: CT HEAD WITHOUT CONTRAST TECHNIQUE: Contiguous axial images were obtained from the base of the skull through the vertex without intravenous contrast. COMPARISON:  CT exams dating back to 07/03/2017 FINDINGS: Brain: Atrophy with chronic moderate small vessel ischemia. General calcifications and along the right parietal lobe. Remote right pontine lacunar infarct. No large vascular territory infarction, hemorrhage or midline shift. No intra-axial mass nor extra-axial fluid. Midline fourth ventricle and basal cisterns. Vascular: Vascular calcifications along both vertebral and cavernous internal carotid arteries. No hyperdense vessel sign. Skull: No acute skull fracture. Sinuses/Orbits: Bilateral lens replacements. The paranasal sinuses mastoids. Other: Dystrophic intramuscular calcifications redemonstrated along the right infratemporal fossa. IMPRESSION: 1. Atrophy with chronic moderate small vessel ischemia. Chronic right pontine lacunar infarct. 2. No acute intracranial abnormality. Electronically Signed   By: Ashley Royalty M.D.   On: 02/09/2018 20:06    Procedures Procedures (including critical care time)  Medications Ordered in ED Medications - No data to display   Initial Impression /  Assessment and Plan / ED Course  I have reviewed the triage vital signs and the nursing notes.  Pertinent labs & imaging results that were available during my care of the patient were reviewed by me and considered in my medical decision making (see chart for details).     Patient just discharged approximately 2 hours prior to arrival.  Patient was discharged to [facility] for rehab.  Patient was admitted on October 5.  She was admitted for a low oxygen saturation shortness of breath and confusion.  Family states that when she got to [place] she had a fair amount of shortness of breath.  And that she seems to be very confused.  Patient has a PICC line in place receiving IV antibiotics due to a difficult urinary tract infection to control.  Prior to the last admission patient had been at home on her own.  Does have a diagnosis of dementia which the family questions.  Has had seen any mental problems earlier.  Work-up here included chest x-ray which revealed that the PICC line was actually going into the jugular vein and going up instead of down.  Also patient had borderline oxygen sats around low 90s on room air not normally on oxygen.  Arterial blood gas showed a pH 7.4 a PCO2 of 33 and PO2 of 57 again on room air.  White count slightly elevated.  Patient's renal function was normal.  Urinalysis without evidence of a distinct urinary tract infection.  Patient had CT head which showed no acute abnormalities evidence of an old lacunar infarct.  In addition chest x-ray did show some mild pulmonary edema right lower lobe area.  Discussed with hospitalist who will admit for the altered mental status the borderline hypoxia and the PICC line in the wrong location.  Final Clinical Impressions(s) / ED Diagnoses   Final diagnoses:  SOB (shortness of breath)  Altered mental status, unspecified altered mental status type    ED Discharge Orders    None       Fredia Sorrow, MD 02/09/18  2320

## 2018-02-09 NOTE — ED Notes (Signed)
Patient transported to X-ray, unable to obtain vitals at this time. 

## 2018-02-09 NOTE — ED Notes (Signed)
Patient had 1 occurrence of diarrhea. Patient changed and put into a brief. MD made aware.

## 2018-02-09 NOTE — ED Notes (Signed)
ED TO INPATIENT HANDOFF REPORT  Name/Age/Gender [name] 84 y.o. female  Code Status Code Status History    Date Active Date Inactive Code Status Order ID Comments User Context   02/04/2018 0000 02/09/2018 1521 Full Code [id number]  Elwyn Reach, MD Inpatient   01/30/2018 2307 02/03/2018 1713 Full Code [id number]  Rise Patience, MD ED   09/04/2017 1601 09/08/2017 1427 Full Code [id number]  Cristal Ford, DO ED   07/03/2017 0438 07/05/2017 2017 Full Code [id number]  Norval Morton, MD ED   02/02/2017 0159 02/05/2017 1953 Full Code [id number]  Etta Quill, DO ED      Home/SNF/Other Nursing Home  Chief Complaint anxiety, hallucinations  Level of Care/Admitting Diagnosis ED Disposition    ED Disposition Condition [place]: [facility] [[id number]]  Level of Care: Med-Surg [16]  Diagnosis: Acute encephalopathy [347425]  Admitting Physician: Vianne Bulls [[id number]]  Attending Physician: Vianne Bulls [[id number]]  PT Class (Do Not Modify): Observation [104]  PT Acc Code (Do Not Modify): Observation [10022]       Medical History Past Medical History:  Diagnosis Date  . [name] (abdominal aortic aneurysm) ([place]) 09/2013   3.6 cm  . Atrial fibrillation ([employer])   . Dementia ([place])   . Diabetes mellitus    pre-diabetes  . Diverticulosis   . Hyperlipidemia   . Hypertension   . Nephrolithiasis   . OSA on CPAP   . Osteoarthritis   . Osteopenia    T=  -2.1 in hip  . Rheumatoid arthritis(714.0)   . S/P carpal tunnel release   . Spinal stenosis     Allergies Allergies  Allergen Reactions  . Sulfonamide Derivatives Hives and Rash    IV Location/Drains/Wounds Patient Lines/Drains/Airways Status   Active Line/Drains/Airways    Name:   Placement date:   Placement time:   Site:   Days:   PICC Single Lumen 02/02/18 PICC Right Brachial 38 cm 0 cm   02/02/18    2101    Brachial   7   Urethral Catheter Jeliyah C Straight-tip 14 Fr.    02/07/18    1215    Straight-tip   2   Ureteral Drain/Stent Right ureter 6 Fr.   01/31/18    0009    Right ureter   9   External Urinary Catheter   02/09/18    1804    -   less than 1          Labs/Imaging Results for orders placed or performed during the hospital encounter of 02/09/18 (from the past 48 hour(s))  CBC with Differential/Platelet     Status: Abnormal   Collection Time: 02/09/18  4:51 PM  Result Value Ref Range   WBC 11.8 (H) 4.0 - 10.5 K/uL   RBC 3.80 (L) 3.87 - 5.11 MIL/uL   Hemoglobin 11.0 (L) 12.0 - 15.0 g/dL   HCT 35.2 (L) 36.0 - 46.0 %   MCV 92.6 80.0 - 100.0 fL   MCH 28.9 26.0 - 34.0 pg   MCHC 31.3 30.0 - 36.0 g/dL   RDW 13.0 11.5 - 15.5 %   Platelets 268 150 - 400 K/uL   nRBC 0.0 0.0 - 0.2 %   Neutrophils Relative % 79 %   Neutro Abs 9.5 (H) 1.7 - 7.7 K/uL   Lymphocytes Relative 13 %   Lymphs Abs 1.5 0.7 - 4.0 K/uL   Monocytes Relative 6 %  Monocytes Absolute 0.7 0.1 - 1.0 K/uL   Eosinophils Relative 0 %   Eosinophils Absolute 0.0 0.0 - 0.5 K/uL   Basophils Relative 1 %   Basophils Absolute 0.1 0.0 - 0.1 K/uL   Immature Granulocytes 1 %   Abs Immature Granulocytes 0.09 (H) 0.00 - 0.07 K/uL    Comment: Performed at [facility], [place] [address] [place], [place] [address]  Comprehensive metabolic panel     Status: Abnormal   Collection Time: 02/09/18  4:51 PM  Result Value Ref Range   Sodium 143 135 - 145 mmol/L   Potassium 3.6 3.5 - 5.1 mmol/L   Chloride 105 98 - 111 mmol/L   CO2 25 22 - 32 mmol/L   Glucose, Bld 111 (H) 70 - 99 mg/dL   BUN 18 8 - 23 mg/dL   Creatinine, Ser 0.75 0.44 - 1.00 mg/dL   Calcium 8.9 8.9 - 10.3 mg/dL   Total Protein 6.9 6.5 - 8.1 g/dL   Albumin 2.8 (L) 3.5 - 5.0 g/dL   AST 21 15 - 41 U/L   ALT 12 0 - 44 U/L   Alkaline Phosphatase 60 38 - 126 U/L   Total Bilirubin 1.4 (H) 0.3 - 1.2 mg/dL   GFR calc non Af Amer >60 >60 mL/min   GFR calc Af Amer >60 >60 mL/min    Comment: (NOTE) The eGFR has been  calculated using the CKD EPI equation. This calculation has not been validated in all clinical situations. eGFR's persistently <60 mL/min signify possible Chronic Kidney Disease.    Anion gap 13 5 - 15    Comment: Performed at [facility], [place] [address], [place], [place] [address]  Urinalysis, Routine w reflex microscopic     Status: Abnormal   Collection Time: 02/09/18  4:51 PM  Result Value Ref Range   Color, Urine YELLOW YELLOW   APPearance CLOUDY (A) CLEAR   Specific Gravity, Urine 1.016 1.005 - 1.030   pH 6.0 5.0 - 8.0   Glucose, UA 50 (A) NEGATIVE mg/dL   Hgb urine dipstick LARGE (A) NEGATIVE   Bilirubin Urine NEGATIVE NEGATIVE   Ketones, ur 80 (A) NEGATIVE mg/dL   Protein, ur 100 (A) NEGATIVE mg/dL   Nitrite NEGATIVE NEGATIVE   Leukocytes, UA NEGATIVE NEGATIVE   RBC / HPF >50 (H) 0 - 5 RBC/hpf   WBC, UA 0-5 0 - 5 WBC/hpf   Bacteria, UA NONE SEEN NONE SEEN   WBC Clumps PRESENT    Mucus PRESENT     Comment: Performed at [facility], [place] [address] [place], [place] [address]  Brain natriuretic peptide     Status: Abnormal   Collection Time: 02/09/18  4:51 PM  Result Value Ref Range   B Natriuretic Peptide 720.4 (H) 0.0 - 100.0 pg/mL    Comment: Performed at [facility], [place] [address], [place], [place] [address]  Blood gas, arterial     Status: Abnormal   Collection Time: 02/09/18  5:00 PM  Result Value Ref Range   FIO2 0.21    pH, Arterial 7.489 (H) 7.350 - 7.450   pCO2 arterial 33.0 32.0 - 48.0 mmHg   pO2, Arterial 57.3 (L) 83.0 - 108.0 mmHg   Bicarbonate 24.8 20.0 - 28.0 mmol/L   Acid-Base Excess 2.2 (H) 0.0 - 2.0 mmol/L   O2 Saturation 90.4 %   Patient temperature 98.6    Collection site LEFT RADIAL    Sample type  622,297    Allens test (pass/fail) PASS PASS    Comment: Performed at [facility], [place] [address] [place], [place] [address]   Dg Chest 2 View  Result Date:  02/09/2018 CLINICAL DATA:  Mental status change.  Shortness of breath. EXAM: CHEST - 2 VIEW COMPARISON:  Chest x-rays dated 02/05/2018 and 02/03/2018. FINDINGS: Stable cardiomegaly. Stable small bilateral pleural effusions. Probable associated mild edema within the RIGHT lower lobe. No pneumothorax seen. RIGHT-sided PICC line is incompletely visualized, tip directed into the lower RIGHT neck. IMPRESSION: 1. PICC line has been displaced since the previous chest x-ray, with tip now directed upwards in the RIGHT neck (tip presumably in the RIGHT internal jugular vein). Recommend repositioning. 2. Stable small bilateral pleural effusions. Probable associated mild edema within the RIGHT lower lobe. 3. Stable cardiomegaly. Electronically Signed   By: Franki Cabot M.D.   On: 02/09/2018 17:45   Ct Head Wo Contrast  Result Date: 02/09/2018 CLINICAL DATA:  Confusion and delirium with altered level of consciousness. EXAM: CT HEAD WITHOUT CONTRAST TECHNIQUE: Contiguous axial images were obtained from the base of the skull through the vertex without intravenous contrast. COMPARISON:  CT exams dating back to 07/03/2017 FINDINGS: Brain: Atrophy with chronic moderate small vessel ischemia. General calcifications and along the right parietal lobe. Remote right pontine lacunar infarct. No large vascular territory infarction, hemorrhage or midline shift. No intra-axial mass nor extra-axial fluid. Midline fourth ventricle and basal cisterns. Vascular: Vascular calcifications along both vertebral and cavernous internal carotid arteries. No hyperdense vessel sign. Skull: No acute skull fracture. Sinuses/Orbits: Bilateral lens replacements. The paranasal sinuses mastoids. Other: Dystrophic intramuscular calcifications redemonstrated along the right infratemporal fossa. IMPRESSION: 1. Atrophy with chronic moderate small vessel ischemia. Chronic right pontine lacunar infarct. 2. No acute intracranial abnormality. Electronically  Signed   By: Ashley Royalty M.D.   On: 02/09/2018 20:06   None  Pending Labs Unresulted Labs (From admission, onward)   None      Vitals/Pain Today's Vitals   02/09/18 1950 02/09/18 2000 02/09/18 2030 02/09/18 2206  BP: (!) 157/108 (!) 111/93 (!) 158/111 123/88  Pulse: (!) 109 (!) 105 (!) 109 (!) 108  Resp: (!) 34 (!) 25 (!) 33 (!) 27  Temp:    99.2 F (37.3 C)  TempSrc:    Oral  SpO2: 92% 94% 93% 92%    Isolation Precautions No active isolations  Medications Medications - No data to display  Mobility manual wheelchair

## 2018-02-09 NOTE — ED Triage Notes (Addendum)
Patient arrived via [facility] from [place]. Patient recently discharged 2 hours ago from upstairs. Patient admitted for UTI. Family c/o of patient mental status change from normal. Patient having increased work of breathing. Patient have hallucinations and anxiety per family.  PICC line right arm. HX. Acute and chronic respiratory failure and sleep apnea.  Facility gave patient albuterol treatment for wheezing -95%-RA HR-100 136/95

## 2018-02-09 NOTE — Progress Notes (Signed)
Location:   [facility] Room Number: 113 A Place of Service:  SNF (31)   CODE STATUS: Full Code  Allergies  Allergen Reactions  . Sulfonamide Derivatives Hives and Rash    Chief Complaint  Patient presents with  . Acute Visit    Change in status    HPI:  She is a 84 year old woman who has been discharged from the hospital. According to her family is normally alert and oriented; she is able to carry on a conversation and answer questions. She has been wheezing since she has arrived at this facility. She is unable to participate in the hpi or ros. She is in respiratory distress; her x-ray on 02-05-18 demonstrated bilateral pleural effusions worsening on the right. Her family states that she has become more agitated with altered mental status over the past 24 hours. The thought was this is due to medications received; but has not improved. Her family desires to send her back to the ED for further evaluation.    Past Medical History:  Diagnosis Date  . [name] (abdominal aortic aneurysm) (HCC) 09/2013   3.6 cm  . Atrial fibrillation (HCC)   . Dementia (HCC)   . Diabetes mellitus    pre-diabetes  . Diverticulosis   . Hyperlipidemia   . Hypertension   . Nephrolithiasis   . OSA on CPAP   . Osteoarthritis   . Osteopenia    T=  -2.1 in hip  . Rheumatoid arthritis(714.0)   . S/P carpal tunnel release   . Spinal stenosis     Past Surgical History:  Procedure Laterality Date  . APPENDECTOMY    . CARPAL TUNNEL RELEASE  12/27/2006   Right subcutaneous ulnar nerve transfer -- Right open carpal tunnel release  . CHOLECYSTECTOMY    . CYSTOSCOPY W/ URETERAL STENT PLACEMENT Right 01/30/2018   Procedure: CYSTOSCOPY WITH STENT REPLACEMENT retrograde pylegram;  Surgeon: Herrick, Benjamin W, MD;  Location: [facility];  Service: Urology;  Laterality: Right;  . FOOT SURGERY    . JOINT REPLACEMENT     BTKR, RTHR  . TOTAL KNEE ARTHROPLASTY  08/21/2007   Right total knee  replacement    Social History   Socioeconomic History  . Marital status: Divorced    Spouse name: Not on file  . Number of children: 6  . Years of education: Not on file  . Highest education level: Not on file  Occupational History  . Not on file  Social Needs  . Financial resource strain: Not on file  . Food insecurity:    Worry: Not on file    Inability: Not on file  . Transportation needs:    Medical: Not on file    Non-medical: Not on file  Tobacco Use  . Smoking status: Never Smoker  . Smokeless tobacco: Never Used  Substance and Sexual Activity  . Alcohol use: No  . Drug use: No  . Sexual activity: Not on file  Lifestyle  . Physical activity:    Days per week: Not on file    Minutes per session: Not on file  . Stress: Not on file  Relationships  . Social connections:    Talks on phone: Not on file    Gets together: Not on file    Attends religious service: Not on file    Active member of club or organization: Not on file    Attends meetings of clubs or organizations: Not on file    Relationship status:   Not on file  . Intimate partner violence:    Fear of current or ex partner: Not on file    Emotionally abused: Not on file    Physically abused: Not on file    Forced sexual activity: Not on file  Other Topics Concern  . Not on file  Social History Narrative  . Not on file   Family History  Problem Relation Age of Onset  . Stroke Father   . Coronary artery disease Son        CABG at age 47  . Heart disease Son   . Heart attack Son   . Stroke Mother   . Hypertension Sister   . Diabetes Brother   . Hypertension Maternal Aunt       VITAL SIGNS There were no vitals taken for this visit.  Outpatient Encounter Medications as of 02/09/2018  Medication Sig Note  . aspirin 81 MG chewable tablet Chew 81 mg by mouth daily.   . atenolol (TENORMIN) 50 MG tablet Take 1 tablet (50 mg total) by mouth daily. 02/03/2018: Unsure of exact day/time  . DULoxetine  (CYMBALTA) 30 MG capsule Take 90 mg by mouth every morning.    . ertapenem (INVANZ) IVPB Inject 1 g into the vein daily for 12 days. Indication:  Bacteremia Last Day of Therapy:  02/14/2018 Labs - Once weekly:  CBC/D and BMP, Labs - Every other week:  ESR and CRP   . gabapentin (NEURONTIN) 400 MG capsule Take 1 capsule (400 mg total) by mouth at bedtime.   . oxymorphone (OPANA) 5 MG tablet Take 1 tablet (5 mg total) by mouth 3 (three) times daily.   . rosuvastatin (CRESTOR) 40 MG tablet TAKE 1 TABLET BY MOUTH EVERY DAY       SIGNIFICANT DIAGNOSTIC EXAMS  TODAY:   01-30-18: renal stone study:  Right proximal ureteral 6 mm obstructing calculus with associated right hydroureteronephrosis and perinephric/ureteral inflammation and edema. 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 Remote cholecystectomy, appendectomy and hysterectomy Colonic diverticulosis without acute inflammation or complicating feature Subacute T12 and L5 compression fractures, new since 07/03/2017  02-04-18: ct angio of chest:  No evidence of pulmonary embolism. Extensive atherosclerotic calcifications of aorta, proximal great vessels and coronary arteries. Small BILATERAL pleural effusions and bibasilar atelectasis. Increased compression fracture of T12 vertebral body. Aortic Atherosclerosis  02-05-18: chest x-ray:  1. Bilateral pleural effusions, increasing on the right and unchanged on the left. 2. Cardiomegaly with slight vascular congestion. Atherosclerotic tortuous thoracic aorta.  02-06-18: 20-D echo: - Normal LV function; mild LVH; mild biatrial enlargement. EF 55-60%  LAB REVIEWED:   01-31-18: urine culture: 80,000 e-coli: ESBL  02-04-18: wbc 8.4; hgb 10.6; hct 34.;3 mcv 94.5; plt 179; glucose  102; bun 20; creat 0.93; k+ 3.7; na++ 140  ca 8.5  02-09-18: glucose 107; bun 20; creat 0.76; k+ 3.2; na++143 ca 8.7    Review of Systems  Unable to  perform ROS: Medical condition   Physical Exam  Constitutional: She appears well-developed and well-nourished. No distress.  Obese   Neck: No thyromegaly present.  Cardiovascular: Normal rate, regular rhythm, normal heart sounds and intact distal pulses.  Pulmonary/Chest: She is in respiratory distress. She has wheezes. She has rales.  Abdominal: Soft. Bowel sounds are normal. She exhibits no distension. There is no tenderness.  Musculoskeletal: She exhibits no edema.  Is able to move all extremities   Lymphadenopathy:    She has no cervical   adenopathy.  Neurological:  Is aware   Skin: Skin is warm and dry. She is not diaphoretic. There is pallor.  Psychiatric:  Is agitated       ASSESSMENT/ PLAN:  TODAY:   1. Acute on chronic respiratory failure with hypoxia 2. Acute delirium  Will have her return back to the ED for further evaluation and treatment options.   MD is aware of resident's narcotic use and is in agreement with current plan of care. We will attempt to wean resident as apropriate   Ok Edwards NP [facility]  Contact [phone] Monday through Friday 8am- 5pm  After hours call [phone]

## 2018-02-09 NOTE — Clinical Social Work Placement (Signed)
Patient received and accepted bed offer at [facility].  CLINICAL SOCIAL WORK PLACEMENT  NOTE  Date:  02/09/2018  Patient Details  Name: ZUMA HUST MRN: [id number] Date of Birth: [birth year]  Clinical Social Work is seeking post-discharge placement for this patient at the [place] level of care (*CSW will initial, date and re-position this form in  chart as items are completed):  Yes   Patient/family provided with [facility] Work Department's list of facilities offering this level of care within the geographic area requested by the patient (or if unable, by the patient's family).  Yes   Patient/family informed of their freedom to choose among providers that offer the needed level of care, that participate in Medicare, Medicaid or managed care program needed by the patient, have an available bed and are willing to accept the patient.  Yes   Patient/family informed of [facility]'s ownership interest in [facility] and [facility], as well as of the fact that they are under no obligation to receive care at these facilities.  PASRR submitted to EDS on       PASRR number received on       Existing PASRR number confirmed on 02/06/18     FL2 transmitted to all facilities in geographic area requested by pt/family on 02/06/18     FL2 transmitted to all facilities within larger geographic area on       Patient informed that his/her managed care company has contracts with or will negotiate with certain facilities, including the following:        Yes   Patient/family informed of bed offers received.  Patient chooses bed at [place]     Physician recommends and patient chooses bed at      Patient to be transferred to [facility] on 02/09/18.  Patient to be transferred to facility by [facility]     Patient family notified on 02/09/18 of transfer.  Name of family member notified:  [name]      PHYSICIAN       Additional Comment:    _______________________________________________ Burnis Medin, LCSW 02/09/2018, 11:19 AM

## 2018-02-09 NOTE — ED Notes (Signed)
Bed: JG81 Expected date:  Expected time:  Means of arrival:  Comments: 84 yo Evaluation

## 2018-02-09 NOTE — Progress Notes (Signed)
In Progress  02/09/18 11:17 PM  This IV nurse consulted with other IVT nurses and determined that the patient would need to have her PICC exchanged. This information was relayed to the ED RN, Claiborne Billings. Will await further orders from provider.  [place], Vida Roller, RN

## 2018-02-09 NOTE — ED Notes (Signed)
Pt daughter called out to say the patient was wheezing. Repositioned patient, lung fields are clear. Upper airway wheeze. Tachypnea. Saturations WNL. Patient daughter also inquiring about IV fluids. Informed EDP of pt status and family request.

## 2018-02-10 ENCOUNTER — Observation Stay (HOSPITAL_COMMUNITY): Payer: Medicare Other

## 2018-02-10 ENCOUNTER — Observation Stay: Payer: Self-pay

## 2018-02-10 ENCOUNTER — Encounter (HOSPITAL_COMMUNITY): Payer: Self-pay | Admitting: Family Medicine

## 2018-02-10 ENCOUNTER — Other Ambulatory Visit: Payer: Self-pay

## 2018-02-10 DIAGNOSIS — M1712 Unilateral primary osteoarthritis, left knee: Secondary | ICD-10-CM | POA: Diagnosis not present

## 2018-02-10 DIAGNOSIS — F0390 Unspecified dementia without behavioral disturbance: Secondary | ICD-10-CM | POA: Diagnosis present

## 2018-02-10 DIAGNOSIS — E785 Hyperlipidemia, unspecified: Secondary | ICD-10-CM | POA: Diagnosis present

## 2018-02-10 DIAGNOSIS — G894 Chronic pain syndrome: Secondary | ICD-10-CM | POA: Diagnosis present

## 2018-02-10 DIAGNOSIS — T829XXA Unspecified complication of cardiac and vascular prosthetic device, implant and graft, initial encounter: Secondary | ICD-10-CM | POA: Diagnosis present

## 2018-02-10 DIAGNOSIS — Z79891 Long term (current) use of opiate analgesic: Secondary | ICD-10-CM | POA: Diagnosis not present

## 2018-02-10 DIAGNOSIS — F329 Major depressive disorder, single episode, unspecified: Secondary | ICD-10-CM | POA: Diagnosis not present

## 2018-02-10 DIAGNOSIS — S22089D Unspecified fracture of T11-T12 vertebra, subsequent encounter for fracture with routine healing: Secondary | ICD-10-CM | POA: Diagnosis not present

## 2018-02-10 DIAGNOSIS — I110 Hypertensive heart disease with heart failure: Secondary | ICD-10-CM | POA: Diagnosis present

## 2018-02-10 DIAGNOSIS — N136 Pyonephrosis: Secondary | ICD-10-CM | POA: Diagnosis present

## 2018-02-10 DIAGNOSIS — G9341 Metabolic encephalopathy: Secondary | ICD-10-CM | POA: Diagnosis present

## 2018-02-10 DIAGNOSIS — Z79899 Other long term (current) drug therapy: Secondary | ICD-10-CM | POA: Diagnosis not present

## 2018-02-10 DIAGNOSIS — Y712 Prosthetic and other implants, materials and accessory cardiovascular devices associated with adverse incidents: Secondary | ICD-10-CM | POA: Diagnosis present

## 2018-02-10 DIAGNOSIS — Z882 Allergy status to sulfonamides status: Secondary | ICD-10-CM | POA: Diagnosis not present

## 2018-02-10 DIAGNOSIS — M810 Age-related osteoporosis without current pathological fracture: Secondary | ICD-10-CM | POA: Diagnosis not present

## 2018-02-10 DIAGNOSIS — I7090 Unspecified atherosclerosis: Secondary | ICD-10-CM | POA: Diagnosis not present

## 2018-02-10 DIAGNOSIS — N200 Calculus of kidney: Secondary | ICD-10-CM | POA: Diagnosis not present

## 2018-02-10 DIAGNOSIS — Z452 Encounter for adjustment and management of vascular access device: Secondary | ICD-10-CM | POA: Diagnosis not present

## 2018-02-10 DIAGNOSIS — R531 Weakness: Secondary | ICD-10-CM | POA: Diagnosis not present

## 2018-02-10 DIAGNOSIS — T82524A Displacement of infusion catheter, initial encounter: Secondary | ICD-10-CM | POA: Diagnosis present

## 2018-02-10 DIAGNOSIS — R310 Gross hematuria: Secondary | ICD-10-CM | POA: Diagnosis present

## 2018-02-10 DIAGNOSIS — I482 Chronic atrial fibrillation, unspecified: Secondary | ICD-10-CM | POA: Diagnosis present

## 2018-02-10 DIAGNOSIS — N390 Urinary tract infection, site not specified: Secondary | ICD-10-CM | POA: Diagnosis not present

## 2018-02-10 DIAGNOSIS — Z1612 Extended spectrum beta lactamase (ESBL) resistance: Secondary | ICD-10-CM | POA: Diagnosis present

## 2018-02-10 DIAGNOSIS — E119 Type 2 diabetes mellitus without complications: Secondary | ICD-10-CM | POA: Diagnosis present

## 2018-02-10 DIAGNOSIS — G9340 Encephalopathy, unspecified: Secondary | ICD-10-CM | POA: Diagnosis not present

## 2018-02-10 DIAGNOSIS — G4733 Obstructive sleep apnea (adult) (pediatric): Secondary | ICD-10-CM | POA: Diagnosis present

## 2018-02-10 DIAGNOSIS — J90 Pleural effusion, not elsewhere classified: Secondary | ICD-10-CM | POA: Diagnosis not present

## 2018-02-10 DIAGNOSIS — M059 Rheumatoid arthritis with rheumatoid factor, unspecified: Secondary | ICD-10-CM | POA: Diagnosis not present

## 2018-02-10 DIAGNOSIS — A4151 Sepsis due to Escherichia coli [E. coli]: Secondary | ICD-10-CM | POA: Diagnosis present

## 2018-02-10 DIAGNOSIS — I10 Essential (primary) hypertension: Secondary | ICD-10-CM | POA: Diagnosis not present

## 2018-02-10 DIAGNOSIS — G9009 Other idiopathic peripheral autonomic neuropathy: Secondary | ICD-10-CM | POA: Diagnosis not present

## 2018-02-10 DIAGNOSIS — F349 Persistent mood [affective] disorder, unspecified: Secondary | ICD-10-CM | POA: Diagnosis not present

## 2018-02-10 DIAGNOSIS — I714 Abdominal aortic aneurysm, without rupture: Secondary | ICD-10-CM | POA: Diagnosis present

## 2018-02-10 DIAGNOSIS — M2550 Pain in unspecified joint: Secondary | ICD-10-CM | POA: Diagnosis not present

## 2018-02-10 DIAGNOSIS — Z96641 Presence of right artificial hip joint: Secondary | ICD-10-CM | POA: Diagnosis present

## 2018-02-10 DIAGNOSIS — Z7401 Bed confinement status: Secondary | ICD-10-CM | POA: Diagnosis not present

## 2018-02-10 DIAGNOSIS — Z7982 Long term (current) use of aspirin: Secondary | ICD-10-CM | POA: Diagnosis not present

## 2018-02-10 DIAGNOSIS — I5030 Unspecified diastolic (congestive) heart failure: Secondary | ICD-10-CM | POA: Diagnosis present

## 2018-02-10 DIAGNOSIS — Z96651 Presence of right artificial knee joint: Secondary | ICD-10-CM | POA: Diagnosis present

## 2018-02-10 LAB — MRSA PCR SCREENING: MRSA by PCR: NEGATIVE

## 2018-02-10 LAB — COMPREHENSIVE METABOLIC PANEL
ALT: 13 U/L (ref 0–44)
AST: 22 U/L (ref 15–41)
Albumin: 2.7 g/dL — ABNORMAL LOW (ref 3.5–5.0)
Alkaline Phosphatase: 59 U/L (ref 38–126)
Anion gap: 17 — ABNORMAL HIGH (ref 5–15)
BUN: 16 mg/dL (ref 8–23)
CO2: 22 mmol/L (ref 22–32)
Calcium: 8.6 mg/dL — ABNORMAL LOW (ref 8.9–10.3)
Chloride: 101 mmol/L (ref 98–111)
Creatinine, Ser: 0.72 mg/dL (ref 0.44–1.00)
GFR calc Af Amer: >60 mL/min (ref >60)
GFR calc non Af Amer: >60 mL/min (ref >60)
Glucose, Bld: 120 mg/dL — ABNORMAL HIGH (ref 70–99)
Potassium: 3.5 mmol/L (ref 3.5–5.1)
Sodium: 140 mmol/L (ref 135–145)
Total Bilirubin: 1.4 mg/dL — ABNORMAL HIGH (ref 0.3–1.2)
Total Protein: 6.9 g/dL (ref 6.5–8.1)

## 2018-02-10 LAB — CBC WITH DIFFERENTIAL/PLATELET
Abs Immature Granulocytes: 0.11 10*3/uL — ABNORMAL HIGH (ref 0.00–0.07)
Basophils Absolute: 0.1 10*3/uL (ref 0.0–0.1)
Basophils Relative: 0 %
Eosinophils Absolute: 0 10*3/uL (ref 0.0–0.5)
Eosinophils Relative: 0 %
HCT: 36.5 % (ref 36.0–46.0)
Hemoglobin: 11.5 g/dL — ABNORMAL LOW (ref 12.0–15.0)
Immature Granulocytes: 1 %
Lymphocytes Relative: 11 %
Lymphs Abs: 1.7 10*3/uL (ref 0.7–4.0)
MCH: 28.4 pg (ref 26.0–34.0)
MCHC: 31.5 g/dL (ref 30.0–36.0)
MCV: 90.1 fL (ref 80.0–100.0)
Monocytes Absolute: 0.8 10*3/uL (ref 0.1–1.0)
Monocytes Relative: 5 %
Neutro Abs: 13.3 10*3/uL — ABNORMAL HIGH (ref 1.7–7.7)
Neutrophils Relative %: 83 %
Platelets: 304 10*3/uL (ref 150–400)
RBC: 4.05 MIL/uL (ref 3.87–5.11)
RDW: 13.1 % (ref 11.5–15.5)
WBC: 15.9 10*3/uL — ABNORMAL HIGH (ref 4.0–10.5)
nRBC: 0 % (ref 0.0–0.2)

## 2018-02-10 LAB — HIV ANTIBODY (ROUTINE TESTING W REFLEX): HIV Screen 4th Generation wRfx: NONREACTIVE

## 2018-02-10 LAB — VITAMIN B12: Vitamin B-12: 297 pg/mL (ref 180–914)

## 2018-02-10 LAB — AMMONIA: Ammonia: 15 umol/L (ref 9–35)

## 2018-02-10 LAB — TSH: TSH: 3.641 u[IU]/mL (ref 0.350–4.500)

## 2018-02-10 MED ORDER — BISACODYL 10 MG RE SUPP: 10.0000 mg | Freq: Every day | RECTAL | Status: DC | PRN

## 2018-02-10 MED ORDER — MORPHINE SULFATE 15 MG PO TABS
15.0000 mg | ORAL_TABLET | Freq: Three times a day (TID) | ORAL | Status: DC | PRN
  Filled 2018-02-10: qty 1

## 2018-02-10 MED ORDER — ATENOLOL 50 MG PO TABS
50.0000 mg | ORAL_TABLET | Freq: Every day | ORAL | Status: DC
  Administered 2018-02-10 – 2018-02-12 (×3): 50 mg via ORAL
  Filled 2018-02-10: qty 1
  Filled 2018-02-10: qty 2
  Filled 2018-02-10: qty 1

## 2018-02-10 MED ORDER — METOPROLOL TARTRATE 5 MG/5ML IV SOLN
5.0000 mg | Freq: Once | INTRAVENOUS | Status: AC
  Administered 2018-02-10: 5 mg via INTRAVENOUS
  Filled 2018-02-10: qty 5

## 2018-02-10 MED ORDER — ENOXAPARIN SODIUM 40 MG/0.4ML ~~LOC~~ SOLN
40.0000 mg | Freq: Every day | SUBCUTANEOUS | Status: DC
  Administered 2018-02-10 (×2): 40 mg via SUBCUTANEOUS
  Filled 2018-02-10 (×2): qty 0.4

## 2018-02-10 MED ORDER — HALOPERIDOL LACTATE 5 MG/ML IJ SOLN: 1.0000 mg | Freq: Four times a day (QID) | INTRAMUSCULAR | Status: DC | PRN

## 2018-02-10 MED ORDER — ACETAMINOPHEN 325 MG PO TABS
650.0000 mg | ORAL_TABLET | Freq: Four times a day (QID) | ORAL | Status: DC | PRN
  Administered 2018-02-10: 650 mg via ORAL
  Filled 2018-02-10: qty 2

## 2018-02-10 MED ORDER — ERTAPENEM IV (FOR PTA / DISCHARGE USE ONLY): 1.0000 g | INTRAVENOUS | Status: DC

## 2018-02-10 MED ORDER — SODIUM CHLORIDE 0.9 % IV SOLN
1.0000 g | Freq: Every day | INTRAVENOUS | Status: DC
  Administered 2018-02-10 – 2018-02-11 (×3): 1000 mg via INTRAVENOUS
  Filled 2018-02-10 (×4): qty 1

## 2018-02-10 MED ORDER — FUROSEMIDE 10 MG/ML IJ SOLN
40.0000 mg | Freq: Once | INTRAMUSCULAR | Status: AC
  Administered 2018-02-10: 40 mg via INTRAVENOUS
  Filled 2018-02-10: qty 4

## 2018-02-10 MED ORDER — SODIUM CHLORIDE 0.9% FLUSH: 3.0000 mL | INTRAVENOUS | Status: DC | PRN

## 2018-02-10 MED ORDER — SENNOSIDES-DOCUSATE SODIUM 8.6-50 MG PO TABS: 1.0000 | ORAL_TABLET | Freq: Every evening | ORAL | Status: DC | PRN

## 2018-02-10 MED ORDER — DULOXETINE HCL 60 MG PO CPEP
90.0000 mg | ORAL_CAPSULE | Freq: Every morning | ORAL | Status: DC
  Administered 2018-02-10 – 2018-02-12 (×3): 90 mg via ORAL
  Filled 2018-02-10: qty 3
  Filled 2018-02-10 (×2): qty 1

## 2018-02-10 MED ORDER — ROSUVASTATIN CALCIUM 20 MG PO TABS
40.0000 mg | ORAL_TABLET | Freq: Every day | ORAL | Status: DC
  Administered 2018-02-10 – 2018-02-11 (×2): 40 mg via ORAL
  Filled 2018-02-10 (×2): qty 2

## 2018-02-10 MED ORDER — GABAPENTIN 400 MG PO CAPS
400.0000 mg | ORAL_CAPSULE | Freq: Every day | ORAL | Status: DC
  Administered 2018-02-10: 400 mg via ORAL
  Filled 2018-02-10: qty 1

## 2018-02-10 MED ORDER — METOPROLOL TARTRATE 5 MG/5ML IV SOLN: 5.0000 mg | INTRAVENOUS | Status: DC | PRN

## 2018-02-10 MED ORDER — ONDANSETRON HCL 4 MG/2ML IJ SOLN: 4.0000 mg | Freq: Four times a day (QID) | INTRAMUSCULAR | Status: DC | PRN

## 2018-02-10 MED ORDER — SODIUM CHLORIDE 0.9% FLUSH
3.0000 mL | Freq: Two times a day (BID) | INTRAVENOUS | Status: DC
  Administered 2018-02-10 – 2018-02-11 (×4): 3 mL via INTRAVENOUS

## 2018-02-10 MED ORDER — DILTIAZEM HCL-DEXTROSE 100-5 MG/100ML-% IV SOLN (PREMIX)
5.0000 mg/h | INTRAVENOUS | Status: DC
  Administered 2018-02-10: 5 mg/h via INTRAVENOUS
  Filled 2018-02-10 (×2): qty 100

## 2018-02-10 MED ORDER — DILTIAZEM LOAD VIA INFUSION
15.0000 mg | Freq: Once | INTRAVENOUS | Status: AC
  Administered 2018-02-10: 15 mg via INTRAVENOUS
  Filled 2018-02-10: qty 15

## 2018-02-10 MED ORDER — SODIUM CHLORIDE 0.9 % IV SOLN
250.0000 mL | INTRAVENOUS | Status: DC | PRN
  Administered 2018-02-10: 250 mL via INTRAVENOUS

## 2018-02-10 MED ORDER — ASPIRIN EC 81 MG PO TBEC
81.0000 mg | DELAYED_RELEASE_TABLET | Freq: Every day | ORAL | Status: DC
  Administered 2018-02-10 – 2018-02-12 (×3): 81 mg via ORAL
  Filled 2018-02-10 (×3): qty 1

## 2018-02-10 MED ORDER — ONDANSETRON HCL 4 MG PO TABS: 4.0000 mg | ORAL_TABLET | Freq: Four times a day (QID) | ORAL | Status: DC | PRN

## 2018-02-10 MED ORDER — HYDRALAZINE HCL 20 MG/ML IJ SOLN
10.0000 mg | INTRAMUSCULAR | Status: DC | PRN
  Administered 2018-02-10: 10 mg via INTRAVENOUS
  Filled 2018-02-10: qty 1

## 2018-02-10 MED ORDER — ACETAMINOPHEN 650 MG RE SUPP: 650.0000 mg | Freq: Four times a day (QID) | RECTAL | Status: DC | PRN

## 2018-02-10 NOTE — Progress Notes (Signed)
PROGRESS NOTE    [name]  QPR:[id number] DOB: Sep 09, 1933 DOA: 02/09/2018 PCP: [name], MD   Brief Narrative: Patient is a 84 year old female with past medical history of A. fib but not on anti-coagulation, dementia, chronic pain syndrome who was recently admitted here for sepsis secondary to pyelonephritis complicated by obstructing ureteral stone treated with a stent, ESBL bacteremia receiving ertapenem who presented from skilled nursing facility for the evaluation of increased confusion and hallucination.  Patient admitted for the management of acute encephalopathy.  Assessment & Plan:   Principal Problem:   Acute encephalopathy Active Problems:   Essential hypertension   OSA (obstructive sleep apnea)   Dementia (HCC)   Chronic pain syndrome   Hydronephrosis with renal and ureteral calculus obstruction   Urinary tract infection due to extended-spectrum beta lactamase (ESBL) producing Escherichia coli   Chronic a-fib   Complication associated with peripherally inserted central catheter (PICC)  Acute encephalopathy: Presented with hallucination and increased confusion.  She was encephalopathic during recent hospitalization.  Her mental status briefly improved but see again became confused and hallucinated.  CT head negative for any intra-cranial pathology.  Most likely this could be associated with her ongoing metabolic/septic etiology or delirium or dementia.  Mental status seem to have improved this morning.  She is oriented to place.  She could tell the year.  We will continue to monitor the patient's mental status.  Displaced PICC line: She has a PICC line on her right arm which is apparently displaced.  PICC line team already notified for the change.  A. fib with RVR: Heart rate was up last night and she had to be started on Cardizem drip.  We will continue to taper the Cardizem drip.  She is on atenolol at home.  We may need to add oral Cardizem.  ESBL  bacteremia:Sepsis secondary to pyelonephritis complicated by obstructing ureteral stone on right treated with stent   She was found to have ESBL in urine and blood, repeat blood cultures on 10/4 remained negative, PICC was placed, and she has been continued on ertapenem at SNF with end-date planned for 02/14/18.  Continue ertapenem  Currently she is afebrile.  Nephrolithiasis with ureteral obstruction: Status post stenting on last admission.  Follow-up with urology after discharge.  Chronic pain syndrome: On bunch  of pain medications which were adjusted due to encephalopathy.  Minimize narcotics as much as possible  OSA: Continue CPAP at night Patient was found to be tachycardic and dyspneic last night.  Chest x-ray showed bilateral pleural effusion, mild edema on the right lower lobe.  She was given a dose of Lasix .She does not look volume overloaded.  DVT prophylaxis: Lovenox Code Status: Full Family Communication:None   Disposition Plan: SNF after improvement in the mental status and change the PICC line   Consultants: None  Procedures:None  Antimicrobials:Ertapenem  Subjective: Patient seen and examined the bedside this morning.  Her mental status might have slightly improved this morning.  She was still on CPAP.  She denies any dyspnea.  Her heart rate has improved.  Objective: Vitals:   02/10/18 0600 02/10/18 0615 02/10/18 0630 02/10/18 0700  BP: 111/60 110/74 (!) 95/46 (!) 99/50  Pulse: 99 (!) 107 (!) 105 (!) 109  Resp: (!) 28 (!) 28 (!) 27 (!) 29  Temp:    99.1 F (37.3 C)  TempSrc:      SpO2: 95% 97% 97% 97%    Intake/Output Summary (Last 24 hours) at 02/10/2018 1030  Last data filed at 02/10/2018 0911 Gross per 24 hour  Intake 0 ml  Output -  Net 0 ml   There were no vitals filed for this visit.  Examination:  General exam: Not in distress,average built HEENT:PERRL,Oral mucosa moist, Ear/Nose normal on gross exam Respiratory system: Bilateral decreased  air entry in the bases cardiovascular system: Afib with RVR. No JVD, murmurs, rubs, gallops or clicks. No pedal edema. Gastrointestinal system: Abdomen is nondistended, soft and nontender. No organomegaly or masses felt. Normal bowel sounds heard. Central nervous system: Alert but not fully oriented. No focal neurological deficits.  Oriented to place and can tell the year Extremities: No edema, no clubbing ,no cyanosis, distal peripheral pulses palpable. Skin: No rashes, lesions or ulcers,no icterus ,no pallor MSK: Normal muscle bulk,tone ,power     Data Reviewed: I have personally reviewed following labs and imaging studies  CBC: Recent Labs  Lab 02/03/18 1952 02/04/18 0812 02/09/18 1651 02/10/18 0051  WBC 9.4 8.4 11.8* 15.9*  NEUTROABS 6.4  --  9.5* 13.3*  HGB 11.0* 10.6* 11.0* 11.5*  HCT 35.1* 34.3* 35.2* 36.5  MCV 93.6 94.5 92.6 90.1  PLT 190 179 268 528   Basic Metabolic Panel: Recent Labs  Lab 02/05/18 0807 02/08/18 0316 02/09/18 0521 02/09/18 1651 02/10/18 0051  NA 139 143 143 143 140  K 3.4* 3.9 3.2* 3.6 3.5  CL 89* 97* 102 105 101  CO2 37* 33* 27 25 22   GLUCOSE 109* 96 107* 111* 120*  BUN 20 22 20 18 16   CREATININE 0.96 0.73 0.76 0.75 0.72  CALCIUM 8.9 8.7* 8.7* 8.9 8.6*   GFR: Estimated Creatinine Clearance: 55.5 mL/min (by C-G formula based on SCr of 0.72 mg/dL). Liver Function Tests: Recent Labs  Lab 02/03/18 1952 02/04/18 0812 02/09/18 1651 02/10/18 0051  AST 16 16 21 22   ALT 9 11 12 13   ALKPHOS 56 57 60 59  BILITOT 0.3 0.5 1.4* 1.4*  PROT 6.3* 6.2* 6.9 6.9  ALBUMIN 2.6* 2.5* 2.8* 2.7*   No results for input(s): LIPASE, AMYLASE in the last 168 hours. Recent Labs  Lab 02/10/18 0051  AMMONIA 15   Coagulation Profile: No results for input(s): INR, PROTIME in the last 168 hours. Cardiac Enzymes: Recent Labs  Lab 02/03/18 1952  TROPONINI <0.03   BNP (last 3 results) No results for input(s): PROBNP in the last 8760 hours. HbA1C: No  results for input(s): HGBA1C in the last 72 hours. CBG: No results for input(s): GLUCAP in the last 168 hours. Lipid Profile: No results for input(s): CHOL, HDL, LDLCALC, TRIG, CHOLHDL, LDLDIRECT in the last 72 hours. Thyroid Function Tests: Recent Labs    02/10/18 0051  TSH 3.641   Anemia Panel: Recent Labs    02/10/18 0051  VITAMINB12 297   Sepsis Labs: No results for input(s): PROCALCITON, LATICACIDVEN in the last 168 hours.  Recent Results (from the past 240 hour(s))  Culture, blood (routine x 2)     Status: None   Collection Time: 02/02/18  9:13 AM  Result Value Ref Range Status   Specimen Description   Final    BLOOD RIGHT ARM Performed at [facility] [address] [place], [place] [address]    Special Requests   Final    BOTTLES DRAWN AEROBIC AND ANAEROBIC Blood Culture adequate volume Performed at [facility] [address], [place],  [address]    Culture   Final    NO GROWTH 5 DAYS Performed  at [facility], [place] [address] [place], [place] [address]    Report Status 02/07/2018 FINAL  Final  Culture, blood (routine x 2)     Status: None   Collection Time: 02/02/18  9:13 AM  Result Value Ref Range Status   Specimen Description   Final    BLOOD LEFT HAND Performed at [facility] [address], [place], [place] [address]    Special Requests   Final    BOTTLES DRAWN AEROBIC ONLY Blood Culture adequate volume Performed at [facility] [address] [place], [place] [address]    Culture   Final    NO GROWTH 5 DAYS Performed at [facility], [place] [address] [place], [place] [address]    Report Status 02/07/2018 FINAL  Final  MRSA PCR Screening     Status: None   Collection Time: 02/10/18  2:50 AM  Result Value Ref Range Status   MRSA by PCR NEGATIVE NEGATIVE Final    Comment:        The GeneXpert MRSA Assay (FDA approved for NASAL specimens only), is  one component of a comprehensive MRSA colonization surveillance program. It is not intended to diagnose MRSA infection nor to guide or monitor treatment for MRSA infections. Performed at [facility], [place] [address], [place],  [address]          Radiology Studies: Dg Chest 2 View  Result Date: 02/09/2018 CLINICAL DATA:  Mental status change.  Shortness of breath. EXAM: CHEST - 2 VIEW COMPARISON:  Chest x-rays dated 02/05/2018 and 02/03/2018. FINDINGS: Stable cardiomegaly. Stable small bilateral pleural effusions. Probable associated mild edema within the RIGHT lower lobe. No pneumothorax seen. RIGHT-sided PICC line is incompletely visualized, tip directed into the lower RIGHT neck. IMPRESSION: 1. PICC line has been displaced since the previous chest x-ray, with tip now directed upwards in the RIGHT neck (tip presumably in the RIGHT internal jugular vein). Recommend repositioning. 2. Stable small bilateral pleural effusions. Probable associated mild edema within the RIGHT lower lobe. 3. Stable cardiomegaly. Electronically Signed   By: Franki Cabot M.D.   On: 02/09/2018 17:45   Ct Head Wo Contrast  Result Date: 02/09/2018 CLINICAL DATA:  Confusion and delirium with altered level of consciousness. EXAM: CT HEAD WITHOUT CONTRAST TECHNIQUE: Contiguous axial images were obtained from the base of the skull through the vertex without intravenous contrast. COMPARISON:  CT exams dating back to 07/03/2017 FINDINGS: Brain: Atrophy with chronic moderate small vessel ischemia. General calcifications and along the right parietal lobe. Remote right pontine lacunar infarct. No large vascular territory infarction, hemorrhage or midline shift. No intra-axial mass nor extra-axial fluid. Midline fourth ventricle and basal cisterns. Vascular: Vascular calcifications along both vertebral and cavernous internal carotid arteries. No hyperdense vessel sign. Skull: No acute skull fracture.  Sinuses/Orbits: Bilateral lens replacements. The paranasal sinuses mastoids. Other: Dystrophic intramuscular calcifications redemonstrated along the right infratemporal fossa. IMPRESSION: 1. Atrophy with chronic moderate small vessel ischemia. Chronic right pontine lacunar infarct. 2. No acute intracranial abnormality. Electronically Signed   By: Ashley Royalty M.D.   On: 02/09/2018 20:06        Scheduled Meds: . aspirin EC  81 mg Oral Daily  . atenolol  50 mg Oral Daily  . DULoxetine  90 mg Oral q morning - 10a  . enoxaparin (LOVENOX) injection  40 mg Subcutaneous QHS  . gabapentin  400 mg Oral QHS  . rosuvastatin  40 mg  Oral q1800  . sodium chloride flush  3 mL Intravenous Q12H   Continuous Infusions: . sodium chloride    . diltiazem (CARDIZEM) infusion 5 mg/hr (02/10/18 0923)  . ertapenem Stopped (02/10/18 0321)     LOS: 0 days    Time spent: 35 mins.More than 50% of that time was spent in counseling and/or coordination of care.      Shelly Coss, MD [facility] Pager [phone]  If 7PM-7AM, please contact night-coverage [email or web address] Password [email or web address] 02/10/2018, 10:30 AM

## 2018-02-10 NOTE — Significant Event (Signed)
Rapid Response Event Note  Overview: Time Called: 0100 Arrival Time: 0105 Event Type: Cardiac, Respiratory  Initial Focused Assessment: RN called d/t pt being tachycardia, tachypnea, wheezing and high blood pressure.  Found pt to be somewhat confused, but able to follow some simple commands.  Also, SOB. Pt Temp 98.8 oral, HR118, BP 190/149, RR32, sats 92 on RA. EKG done.   Interventions: pt was placed on monitor.  Pt placed on [place] and RT called to assist, MD called given update. 5 mg IV metoprolol had been given w/o change.  Pt will be put on a cardizem drip and closely monitored.   Plan of Care (if not transferred): Pt to transfer to stepdown   Event Summary:   Dyann Ruddle

## 2018-02-10 NOTE — Progress Notes (Signed)
Power flushed R-SL PICC per protocol to attempt to reposition PICC.  Power Flushed with 10 ml Normal saline syringes x2 while patient sitting upright and leaning forward. Staff RN to obtain CXR to verify PICC tip placement.

## 2018-02-10 NOTE — Progress Notes (Signed)
Spoke with family members about exchanging PICC or possibly using the other arm. Informed them of the difficulty during insertion and the possibility of the same problem during the  exchange. Family members stated the patient had been stuck multiple times and respectfully declined to precede considering the patient has adequate IV access. Family was reassured that  access can be placed at a later time if needed. Family also requested that present PICC be discontinued and leaving current PIV. Patient unable to make informed decisions about health care. Son and two daughters in agreement with the above.

## 2018-02-10 NOTE — Progress Notes (Addendum)
When getting patient off the bed pan there was a moderate amount of bright red blood mixed in with her urine   MD made aware.  Will continue to monitor closely.

## 2018-02-10 NOTE — Progress Notes (Addendum)
Spoke with Dr. Tawanna Solo concerning exchange of SL PICC. Advised if we could attempt to power flush present PICC to see if it will drop into appropriate position and confirm with x-ray. Today's x-ray showed it residing in the R IJ. Suggested also that a PIV could be placed for antibiotics or a midline if patient is transferred back to SNF. Dr. Tawanna Solo stated a PICC is the appropriate line at this time. He also stated that we could attempt the left arm, as first placement was difficult according to Tam, RN.

## 2018-02-10 NOTE — Progress Notes (Signed)
Called RRRN due to patient's EKG, tachycardia, tachypnea with wheezing and elevated BP 182/113, gave IV metoprolol 5 mg with no relief. MD notified, Orders received will transfer to stepdown. Report given to [facility].

## 2018-02-10 NOTE — Progress Notes (Signed)
Called to room to eval pt., RN states that patient is wheezing.  No wheezing appreciated when this RT listened to BBS.  Pt. Does have forced exhalation at times at this causes a "wheezing effect", however, no active wheezes noted.  Pt. Has CPAP ordered for hour of sleep which has been provided.  Pt. Is being transferred to ICU for BP drips.

## 2018-02-10 NOTE — H&P (Addendum)
History and Physical    [name] ZOX:[id number] DOB: 1933-09-15 DOA: 02/09/2018  PCP: [name], MD   Patient coming from: SNF  Chief Complaint: Confusion, hallucinations  HPI: [name] is a 84 y.o. female with medical history significant for atrial fibrillation not on anticoagulation due to falls, dementia, chronic pain, recent admission with sepsis secondary to pyelonephritis complicated by obstructing ureteral stone treated with stent, ESBL bacteremia receiving ertapenem with end date 02/14/2018, and now presenting from her SNF for evaluation of increased confusion and hallucinations.  Patient was discharged earlier in the day after admission for acute encephalopathy.  She had reportedly improved with adjustment of her pain medications and her daughter reports that she was having a normal conversation yesterday.  After arriving at the SNF, patient was noted to be markedly confused, anxious, appeared to be having visual hallucinations, and thought she was talking to a relative that is no longer living.  Patient was not expressing any specific complaints.  There was no fall or trauma preceding this.  ED Course: Upon arrival to the ED, patient is found to be afebrile, saturating low 90s on room air, mildly tachypneic, slightly tachycardic, and with stable blood pressure.  Noncontrast head CT is negative for acute intracranial abnormality.  Chest x-ray is concerning for displaced PICC line with tip directed upwards, presumably in the right IJ.  Chemistry panel is notable for albumin of 2.8 and CBC features a mild leukocytosis and stable normocytic anemia.  Patient remains hemodynamically stable, in no apparent respiratory distress, but is confused and hallucinating in the ED and will be admitted for ongoing evaluation and management of this.  Review of Systems:  All other systems reviewed and apart from HPI, are negative.  Past Medical History:  Diagnosis Date  . [name] (abdominal  aortic aneurysm) ([place]) 09/2013   3.6 cm  . Atrial fibrillation ([place])   . Dementia ([place])   . Diabetes mellitus    pre-diabetes  . Diverticulosis   . Hyperlipidemia   . Hypertension   . Nephrolithiasis   . OSA on CPAP   . Osteoarthritis   . Osteopenia    T=  -2.1 in hip  . Rheumatoid arthritis(714.0)   . S/P carpal tunnel release   . Spinal stenosis     Past Surgical History:  Procedure Laterality Date  . APPENDECTOMY    . CARPAL TUNNEL RELEASE  12/27/2006   Right subcutaneous ulnar nerve transfer -- Right open carpal tunnel release  . CHOLECYSTECTOMY    . CYSTOSCOPY W/ URETERAL STENT PLACEMENT Right 01/30/2018   Procedure: CYSTOSCOPY WITH STENT REPLACEMENT retrograde pylegram;  Surgeon: Ardis Hughs, MD;  Location: [facility];  Service: Urology;  Laterality: Right;  . FOOT SURGERY    . JOINT REPLACEMENT     BTKR, RTHR  . TOTAL KNEE ARTHROPLASTY  08/21/2007   Right total knee replacement     reports that she has never smoked. She has never used smokeless tobacco. She reports that she does not drink alcohol or use drugs.  Allergies  Allergen Reactions  . Sulfonamide Derivatives Hives and Rash    Family History  Problem Relation Age of Onset  . Stroke Father   . Coronary artery disease Son        CABG at age 77  . Heart disease Son   . Heart attack Son   . Stroke Mother   . Hypertension Sister   . Diabetes Brother   . Hypertension Maternal Aunt  Prior to Admission medications   Medication Sig Start Date End Date Taking? Authorizing Provider  aspirin EC 81 MG tablet Take 81 mg by mouth daily.   Yes [provider]  atenolol (TENORMIN) 50 MG tablet Take 1 tablet (50 mg total) by mouth daily. 08/24/17  Yes [name], MD  DULoxetine (CYMBALTA) 30 MG capsule Take 90 mg by mouth every morning.  07/29/14  Yes [provider]  ertapenem (INVANZ) IVPB Inject 1 g into the vein daily for 12 days. Indication:  Bacteremia Last Day of Therapy:   02/14/2018 Labs - Once weekly:  CBC/D and BMP, Labs - Every other week:  ESR and CRP 02/03/18 02/15/18 Yes Georgette Shell, MD  rosuvastatin (CRESTOR) 40 MG tablet TAKE 1 TABLET BY MOUTH EVERY DAY Patient taking differently: Take 40 mg by mouth daily.  12/25/17  Yes [name], MD  gabapentin (NEURONTIN) 400 MG capsule Take 1 capsule (400 mg total) by mouth at bedtime. 02/08/18 02/08/19  Charlynne Cousins, MD  oxymorphone (OPANA) 5 MG tablet Take 1 tablet (5 mg total) by mouth 3 (three) times daily. 02/08/18   Charlynne Cousins, MD    Physical Exam: Vitals:   02/09/18 2030 02/09/18 2206 02/09/18 2338 02/10/18 0003  BP: (!) 158/111 123/88 (!) 168/100 (!) 147/96  Pulse: (!) 109 (!) 108 (!) 123 (!) 119  Resp: (!) 33 (!) 27 (!) 32 (!) 40  Temp:  99.2 F (37.3 C) 98.6 F (37 C) 100 F (37.8 C)  TempSrc:  Oral  Oral  SpO2: 93% 92% 95% 93%    Constitutional: No respiratory distress, anxious, disoriented  Eyes: PERTLA, lids and conjunctivae normal ENMT: Mucous membranes are moist. Posterior pharynx clear of any exudate or lesions.   Neck: normal, supple, no masses, no thyromegaly Respiratory: Breath sounds slightly diminished. Mild tachypnea. No accessory muscle use.  Cardiovascular: Rate ~110 and irregular. No extremity edema.   Abdomen: No distension, no tenderness, soft. Bowel sounds normal.  Musculoskeletal: no clubbing / cyanosis. No joint deformity upper and lower extremities.    Skin: no significant rashes, lesions, ulcers. Warm, dry, well-perfused. Neurologic: No facial asymmetry. Patellar DTRs normal. Moving all extremities.  Psychiatric: Alert. Disoriented. Anxious, labile emotions.    Labs on Admission: I have personally reviewed following labs and imaging studies  CBC: Recent Labs  Lab 02/03/18 0522 02/03/18 1952 02/04/18 0812 02/09/18 1651  WBC 11.0* 9.4 8.4 11.8*  NEUTROABS  --  6.4  --  9.5*  HGB 10.9* 11.0* 10.6* 11.0*  HCT 35.9* 35.1* 34.3*  35.2*  MCV 95.0 93.6 94.5 92.6  PLT 171 190 179 021   Basic Metabolic Panel: Recent Labs  Lab 02/04/18 0812 02/05/18 0807 02/08/18 0316 02/09/18 0521 02/09/18 1651  NA 140 139 143 143 143  K 3.7 3.4* 3.9 3.2* 3.6  CL 100 89* 97* 102 105  CO2 32 37* 33* 27 25  GLUCOSE 102* 109* 96 107* 111*  BUN 20 20 22 20 18   CREATININE 0.93 0.96 0.73 0.76 0.75  CALCIUM 8.5* 8.9 8.7* 8.7* 8.9   GFR: Estimated Creatinine Clearance: 55.5 mL/min (by C-G formula based on SCr of 0.75 mg/dL). Liver Function Tests: Recent Labs  Lab 02/03/18 1952 02/04/18 0812 02/09/18 1651  AST 16 16 21   ALT 9 11 12   ALKPHOS 56 57 60  BILITOT 0.3 0.5 1.4*  PROT 6.3* 6.2* 6.9  ALBUMIN 2.6* 2.5* 2.8*   No results for input(s): LIPASE, AMYLASE in the last  168 hours. No results for input(s): AMMONIA in the last 168 hours. Coagulation Profile: No results for input(s): INR, PROTIME in the last 168 hours. Cardiac Enzymes: Recent Labs  Lab 02/03/18 1952  TROPONINI <0.03   BNP (last 3 results) No results for input(s): PROBNP in the last 8760 hours. HbA1C: No results for input(s): HGBA1C in the last 72 hours. CBG: No results for input(s): GLUCAP in the last 168 hours. Lipid Profile: No results for input(s): CHOL, HDL, LDLCALC, TRIG, CHOLHDL, LDLDIRECT in the last 72 hours. Thyroid Function Tests: No results for input(s): TSH, T4TOTAL, FREET4, T3FREE, THYROIDAB in the last 72 hours. Anemia Panel: No results for input(s): VITAMINB12, FOLATE, FERRITIN, TIBC, IRON, RETICCTPCT in the last 72 hours. Urine analysis:    Component Value Date/Time   COLORURINE YELLOW 02/09/2018 1651   APPEARANCEUR CLOUDY (A) 02/09/2018 1651   LABSPEC 1.016 02/09/2018 1651   PHURINE 6.0 02/09/2018 1651   GLUCOSEU 50 (A) 02/09/2018 1651   GLUCOSEU NEG mg/dL 03/08/2007 2031   HGBUR LARGE (A) 02/09/2018 1651   HGBUR negative 03/06/2008 0846   BILIRUBINUR NEGATIVE 02/09/2018 1651   KETONESUR 80 (A) 02/09/2018 1651   PROTEINUR  100 (A) 02/09/2018 1651   UROBILINOGEN 0.2 09/20/2013 1535   NITRITE NEGATIVE 02/09/2018 1651   LEUKOCYTESUR NEGATIVE 02/09/2018 1651   Sepsis Labs: @LABRCNTIP (procalcitonin:4,lacticidven:4) ) Recent Results (from the past 240 hour(s))  Urine Culture     Status: Abnormal   Collection Time: 01/31/18  1:37 AM  Result Value Ref Range Status   Specimen Description   Final    URINE, CATHETERIZED Performed at [facility], [place] [address], [place], [place] [address]    Special Requests   Final    NONE Performed at [facility], [place] [address], [place], [place] [address]    Culture (A)  Final    80,000 COLONIES/mL ESCHERICHIA COLI Confirmed Extended Spectrum Beta-Lactamase Producer (ESBL).  In bloodstream infections from ESBL organisms, carbapenems are preferred over piperacillin/tazobactam. They are shown to have a lower risk of mortality.    Report Status 02/02/2018 FINAL  Final   Organism ID, Bacteria ESCHERICHIA COLI (A)  Final      Susceptibility   Escherichia coli - MIC*    AMPICILLIN >=32 RESISTANT Resistant     CEFAZOLIN >=64 RESISTANT Resistant     CEFTRIAXONE >=64 RESISTANT Resistant     CIPROFLOXACIN >=4 RESISTANT Resistant     GENTAMICIN <=1 SENSITIVE Sensitive     IMIPENEM <=0.25 SENSITIVE Sensitive     NITROFURANTOIN <=16 SENSITIVE Sensitive     TRIMETH/SULFA >=320 RESISTANT Resistant     AMPICILLIN/SULBACTAM >=32 RESISTANT Resistant     PIP/TAZO 16 SENSITIVE Sensitive     Extended ESBL POSITIVE Resistant     * 80,000 COLONIES/mL ESCHERICHIA COLI  Culture, blood (routine x 2)     Status: None   Collection Time: 02/02/18  9:13 AM  Result Value Ref Range Status   Specimen Description   Final    BLOOD RIGHT ARM Performed at [facility] [address], [place], [place] [address]    Special Requests   Final    BOTTLES DRAWN AEROBIC AND ANAEROBIC Blood Culture adequate volume Performed at [facility] [address], [place], [place] [address]    Culture   Final    NO GROWTH 5 DAYS Performed at [facility], [place] [address], [place], [place] [address]    Report Status 02/07/2018 FINAL  Final  Culture, blood (routine x 2)     Status: None   Collection Time: 02/02/18  9:13 AM  Result Value Ref Range Status   Specimen Description   Final    BLOOD LEFT HAND Performed at [facility] [address] [place], [place] [address]    Special Requests   Final    BOTTLES DRAWN AEROBIC ONLY Blood Culture adequate volume Performed at [facility] [address], [place], [place] [address]    Culture   Final    NO GROWTH 5 DAYS Performed at [facility], [place] [address] [place], [place] [address]    Report Status 02/07/2018 FINAL  Final     Radiological Exams on Admission: Dg Chest 2 View  Result Date: 02/09/2018 CLINICAL DATA:  Mental status change.  Shortness of breath. EXAM: CHEST - 2 VIEW COMPARISON:  Chest x-rays dated 02/05/2018 and 02/03/2018. FINDINGS: Stable cardiomegaly. Stable small bilateral pleural effusions. Probable associated mild edema within the RIGHT lower lobe. No pneumothorax seen. RIGHT-sided PICC line is incompletely visualized, tip directed into the lower RIGHT neck. IMPRESSION: 1. PICC line has been displaced since the previous chest x-ray, with tip now directed upwards in the RIGHT neck (tip presumably in the RIGHT internal jugular vein). Recommend repositioning. 2. Stable small bilateral pleural effusions. Probable associated mild edema within the RIGHT lower lobe. 3. Stable cardiomegaly. Electronically Signed   By: Franki Cabot M.D.   On: 02/09/2018 17:45   Ct Head Wo Contrast  Result Date: 02/09/2018 CLINICAL DATA:  Confusion and delirium with altered level of consciousness. EXAM: CT HEAD WITHOUT CONTRAST TECHNIQUE: Contiguous axial images were obtained from the base of the skull through the vertex  without intravenous contrast. COMPARISON:  CT exams dating back to 07/03/2017 FINDINGS: Brain: Atrophy with chronic moderate small vessel ischemia. General calcifications and along the right parietal lobe. Remote right pontine lacunar infarct. No large vascular territory infarction, hemorrhage or midline shift. No intra-axial mass nor extra-axial fluid. Midline fourth ventricle and basal cisterns. Vascular: Vascular calcifications along both vertebral and cavernous internal carotid arteries. No hyperdense vessel sign. Skull: No acute skull fracture. Sinuses/Orbits: Bilateral lens replacements. The paranasal sinuses mastoids. Other: Dystrophic intramuscular calcifications redemonstrated along the right infratemporal fossa. IMPRESSION: 1. Atrophy with chronic moderate small vessel ischemia. Chronic right pontine lacunar infarct. 2. No acute intracranial abnormality. Electronically Signed   By: Ashley Royalty M.D.   On: 02/09/2018 20:06    EKG: Independently reviewed. Atrial fibrillation, rate 116.   Assessment/Plan  1. Acute encephalopathy  - Presents from SNF with hallucinations and increased confusion  - She was encephalopathic during recent hospitalization, improved with adjustment of her pain medications, and was reportedly having normal conversation yesterday per daughter at bedside  - There are no focal deficits noted and CT head is negative for acute findings  - Continue supportive care, frequent reorientation, check ammonia, TSH, B12, folate, and RPR   2. PICC displaced   - PICC recently placed on right side for administration of ertapenem through 10/16 for ESBL bacteremia - Line is noted to be displaced on CXR, with tip now directed cephalically, likely in [place]    - IV team has been consulted for replacement   3. Chronic atrial fibrillation   - In a fib on admission with HR in low 100's  - CHADS-VASc is 75 (age x2, gender, HTN)  - Not anticoagulated d/t falls  - Continue beta-blocker, will  give dose now  for elevated HR    4. ESBL bacteremia  - Pt admitted earlier this month with sepsis secondary to pyelonephritis complicated by obstructing ureteral stone on right treated with stent   - She was found to have ESBL in urine and blood, repeat blood cultures on 10/4 remained negative, PICC was placed, and she has been continued on ertapenem at SNF with end-date planned for 02/14/18   - No fever, no bacteria on UA  - Continue ertapenem   5. Nephrolithiasis with ureteral obstruction s/p stent  - She was admitted earlier this month with sepsis secondary to pyelonephritis with obstructing proximal right ureteral stone  - She had stent placed during that admission  - Renal function is preserved  - She will follow-up with urology after discharge    6. Chronic pain syndrome  - Medications recently adjusted d/t acute encephalopathy  - Continue her reduced-dose gabapentin and dose-equivalent oral morphine in place of Opana while in hospital    7. OSA  - Continue CPAP qHS    ADDENDUM:  HR has increased despite IVP Lopressor and she has increased breathing difficulty. Appears to have some CHF on CXR, likely rate-related (or diastolic failure) based on recent echo with preserved EF. Will transfer to SDU, start IV diltiazem, and give a dose of IV Lasix.     DVT prophylaxis: Lovenox  Code Status: Full  Family Communication: Daughter updated at bedside Consults called: None Admission status: Observation     Vianne Bulls, MD [facility] Pager [phone]  If 7PM-7AM, please contact night-coverage [email or web address] Password [email or web address]  02/10/2018, 12:22 AM

## 2018-02-10 NOTE — Progress Notes (Signed)
Morphine IR 15 mg PO returned to pyxis on [place] under floor stock since patient had transferred to stepdown.  Witness by Sharman Cheek RN.

## 2018-02-11 ENCOUNTER — Encounter (HOSPITAL_COMMUNITY): Payer: Self-pay | Admitting: Radiology

## 2018-02-11 ENCOUNTER — Inpatient Hospital Stay (HOSPITAL_COMMUNITY): Payer: Medicare Other

## 2018-02-11 LAB — CBC WITH DIFFERENTIAL/PLATELET
Abs Immature Granulocytes: 0.1 10*3/uL — ABNORMAL HIGH (ref 0.00–0.07)
Basophils Absolute: 0.1 10*3/uL (ref 0.0–0.1)
Basophils Relative: 1 %
Eosinophils Absolute: 0.1 10*3/uL (ref 0.0–0.5)
Eosinophils Relative: 1 %
HCT: 35.4 % — ABNORMAL LOW (ref 36.0–46.0)
Hemoglobin: 11.1 g/dL — ABNORMAL LOW (ref 12.0–15.0)
Immature Granulocytes: 1 %
Lymphocytes Relative: 23 %
Lymphs Abs: 2.2 10*3/uL (ref 0.7–4.0)
MCH: 28.3 pg (ref 26.0–34.0)
MCHC: 31.4 g/dL (ref 30.0–36.0)
MCV: 90.3 fL (ref 80.0–100.0)
Monocytes Absolute: 0.6 10*3/uL (ref 0.1–1.0)
Monocytes Relative: 7 %
Neutro Abs: 6.6 10*3/uL (ref 1.7–7.7)
Neutrophils Relative %: 67 %
Platelets: 305 10*3/uL (ref 150–400)
RBC: 3.92 MIL/uL (ref 3.87–5.11)
RDW: 13.2 % (ref 11.5–15.5)
WBC: 9.7 10*3/uL (ref 4.0–10.5)
nRBC: 0 % (ref 0.0–0.2)

## 2018-02-11 LAB — RPR: RPR Ser Ql: NONREACTIVE

## 2018-02-11 LAB — BASIC METABOLIC PANEL
Anion gap: 15 (ref 5–15)
BUN: 22 mg/dL (ref 8–23)
CO2: 29 mmol/L (ref 22–32)
Calcium: 8.7 mg/dL — ABNORMAL LOW (ref 8.9–10.3)
Chloride: 99 mmol/L (ref 98–111)
Creatinine, Ser: 0.84 mg/dL (ref 0.44–1.00)
GFR calc Af Amer: >60 mL/min (ref >60)
GFR calc non Af Amer: >60 mL/min (ref >60)
Glucose, Bld: 92 mg/dL (ref 70–99)
Potassium: 2.6 mmol/L — CL (ref 3.5–5.1)
Sodium: 143 mmol/L (ref 135–145)

## 2018-02-11 LAB — POTASSIUM: Potassium: 4.7 mmol/L (ref 3.5–5.1)

## 2018-02-11 LAB — MAGNESIUM: Magnesium: 1.8 mg/dL (ref 1.7–2.4)

## 2018-02-11 MED ORDER — POTASSIUM CHLORIDE 10 MEQ/100ML IV SOLN
10.0000 meq | Freq: Once | INTRAVENOUS | Status: AC
  Administered 2018-02-11: 10 meq via INTRAVENOUS
  Filled 2018-02-11: qty 100

## 2018-02-11 MED ORDER — POTASSIUM CHLORIDE 10 MEQ/100ML IV SOLN
10.0000 meq | INTRAVENOUS | Status: AC
  Administered 2018-02-11 (×5): 10 meq via INTRAVENOUS
  Filled 2018-02-11 (×5): qty 100

## 2018-02-11 MED ORDER — IOPAMIDOL (ISOVUE-300) INJECTION 61%
100.0000 mL | Freq: Once | INTRAVENOUS | Status: AC | PRN
  Administered 2018-02-11: 100 mL via INTRAVENOUS

## 2018-02-11 MED ORDER — POTASSIUM CHLORIDE CRYS ER 20 MEQ PO TBCR
40.0000 meq | EXTENDED_RELEASE_TABLET | Freq: Once | ORAL | Status: AC
  Administered 2018-02-11: 40 meq via ORAL
  Filled 2018-02-11: qty 2

## 2018-02-11 MED ORDER — QUETIAPINE FUMARATE 25 MG PO TABS
25.0000 mg | ORAL_TABLET | Freq: Every day | ORAL | Status: DC
  Administered 2018-02-11: 25 mg via ORAL
  Filled 2018-02-11: qty 1

## 2018-02-11 MED ORDER — MAGNESIUM SULFATE 2 GM/50ML IV SOLN
2.0000 g | Freq: Once | INTRAVENOUS | Status: AC
  Administered 2018-02-11: 2 g via INTRAVENOUS
  Filled 2018-02-11: qty 50

## 2018-02-11 MED ORDER — GABAPENTIN 100 MG PO CAPS
200.0000 mg | ORAL_CAPSULE | Freq: Every day | ORAL | Status: DC
  Administered 2018-02-11: 200 mg via ORAL
  Filled 2018-02-11: qty 2

## 2018-02-11 MED ORDER — SODIUM CHLORIDE 0.9 % IV SOLN
INTRAVENOUS | Status: DC
  Administered 2018-02-11 – 2018-02-12 (×3): via INTRAVENOUS

## 2018-02-11 MED ORDER — SODIUM CHLORIDE 0.9 % IV BOLUS
500.0000 mL | Freq: Once | INTRAVENOUS | Status: AC
  Administered 2018-02-11: 500 mL via INTRAVENOUS

## 2018-02-11 MED ORDER — SODIUM CHLORIDE 0.9 % IV SOLN
INTRAVENOUS | Status: AC
  Filled 2018-02-11: qty 250

## 2018-02-11 NOTE — Progress Notes (Signed)
PROGRESS NOTE    [name]  KDT:[id number] DOB: 06-03-1933 DOA: 02/09/2018 PCP: [name], MD   Brief Narrative: Patient is a 84 year old female with past medical history of A. fib but not on anti-coagulation, dementia, chronic pain syndrome who was recently admitted here for sepsis secondary to pyelonephritis complicated by obstructing ureteral stone treated with a stent, ESBL bacteremia receiving ertapenem who presented from skilled nursing facility for the evaluation of increased confusion and hallucination.  Patient admitted for the management of acute encephalopathy.  Assessment & Plan:   Principal Problem:   Acute encephalopathy Active Problems:   Essential hypertension   OSA (obstructive sleep apnea)   Dementia (HCC)   Chronic pain syndrome   Hydronephrosis with renal and ureteral calculus obstruction   Urinary tract infection due to extended-spectrum beta lactamase (ESBL) producing Escherichia coli   Chronic a-fib   Complication associated with peripherally inserted central catheter (PICC)  Acute encephalopathy: Presented with hallucination and increased confusion.  She was encephalopathic during recent hospitalization.  Her mental status briefly improved but she again became confused and hallucinated.  CT head negative for any intra-cranial pathology.  Most likely this could be associated with her ongoing metabolic/septic etiology or delirium or dementia.  Mental status seem to have improved this morning.  She is oriented to time,placeand person.  We will continue to monitor the patient's mental status. Reduced the dose of  gabapentin.  Started on low dose Seroquel.  Displaced PICC line: She has a PICC line on her right arm which is apparently displaced and was removed.  A. fib with RVR: Heart rate under control right now..  She is on atenolol at home which we will continue.  ESBL bacteremia:Sepsis secondary to pyelonephritis complicated by obstructing ureteral  stone on right treated with stent   She was found to have ESBL in urine and blood, repeat blood cultures on 10/4 remained negative, PICC was placed, and she has been continued on ertapenem at SNF with end-date planned for 02/14/18.  Continue ertapenem  Currently she is afebrile.  Nephrolithiasis with ureteral obstruction: Status post stenting on last admission.  Follow-up with urology after discharge.  Chronic pain syndrome: On bunch  of pain medications which were adjusted due to encephalopathy.  Minimize narcotics as much as possible  OSA: Continue CPAP at night  Hematuria: Patient was having gross hematuria.  Suspect from traumatic catheterization.  Will reinsert the Foley catheter.  Will check CT abdomen/pelvis.  Will notify urology tomorrow if there is any obvious pathology.   DVT prophylaxis: SCD Code Status: Full Family Communication:Son and Daughter  Disposition Plan: SNF after improvement in the mental status   Consultants: None  Procedures:None  Antimicrobials:Ertapenem  Subjective: Patient seen and examined the bedside this morning.  Her mental status might has improved this morning.  She was having hematuria.  No overnight fever, nausea or vomiting.  Objective: Vitals:   02/10/18 2029 02/10/18 2325 02/11/18 0606 02/11/18 0609  BP: 126/78  121/75   Pulse: 83 87 89   Resp: 18 18 20    Temp: 98.7 F (37.1 C)  98.1 F (36.7 C)   TempSrc: Oral  Oral   SpO2: 92% 96% 92%   Weight:    73.4 kg    Intake/Output Summary (Last 24 hours) at 02/11/2018 1308 Last data filed at 02/11/2018 0600 Gross per 24 hour  Intake 535.95 ml  Output 50 ml  Net 485.95 ml   Filed Weights   02/11/18 0609  Weight: 73.4  kg    Examination:  General exam: Not in distress,average built HEENT:PERRL,Oral mucosa moist, Ear/Nose normal on gross exam Respiratory system: Bilateral decreased air entry in the bases cardiovascular system: Afib . No JVD, murmurs, rubs, gallops or clicks. No  pedal edema. Gastrointestinal system: Abdomen is nondistended, soft and nontender. No organomegaly or masses felt. Normal bowel sounds heard. Central nervous system: Alert and oriented. No focal neurological deficits.   Extremities: No edema, no clubbing ,no cyanosis, distal peripheral pulses palpable. Skin: No rashes, lesions or ulcers,no icterus ,no pallor   Data Reviewed: I have personally reviewed following labs and imaging studies  CBC: Recent Labs  Lab 02/09/18 1651 02/10/18 0051 02/11/18 0608  WBC 11.8* 15.9* 9.7  NEUTROABS 9.5* 13.3* 6.6  HGB 11.0* 11.5* 11.1*  HCT 35.2* 36.5 35.4*  MCV 92.6 90.1 90.3  PLT 268 304 660   Basic Metabolic Panel: Recent Labs  Lab 02/08/18 0316 02/09/18 0521 02/09/18 1651 02/10/18 0051 02/11/18 0608  NA 143 143 143 140 143  K 3.9 3.2* 3.6 3.5 2.6*  CL 97* 102 105 101 99  CO2 33* 27 25 22 29   GLUCOSE 96 107* 111* 120* 92  BUN 22 20 18 16 22   CREATININE 0.73 0.76 0.75 0.72 0.84  CALCIUM 8.7* 8.7* 8.9 8.6* 8.7*  MG  --   --   --   --  1.8   GFR: Estimated Creatinine Clearance: 50.7 mL/min (by C-G formula based on SCr of 0.84 mg/dL). Liver Function Tests: Recent Labs  Lab 02/09/18 1651 02/10/18 0051  AST 21 22  ALT 12 13  ALKPHOS 60 59  BILITOT 1.4* 1.4*  PROT 6.9 6.9  ALBUMIN 2.8* 2.7*   No results for input(s): LIPASE, AMYLASE in the last 168 hours. Recent Labs  Lab 02/10/18 0051  AMMONIA 15   Coagulation Profile: No results for input(s): INR, PROTIME in the last 168 hours. Cardiac Enzymes: No results for input(s): CKTOTAL, CKMB, CKMBINDEX, TROPONINI in the last 168 hours. BNP (last 3 results) No results for input(s): PROBNP in the last 8760 hours. HbA1C: No results for input(s): HGBA1C in the last 72 hours. CBG: No results for input(s): GLUCAP in the last 168 hours. Lipid Profile: No results for input(s): CHOL, HDL, LDLCALC, TRIG, CHOLHDL, LDLDIRECT in the last 72 hours. Thyroid Function Tests: Recent Labs     02/10/18 0051  TSH 3.641   Anemia Panel: Recent Labs    02/10/18 0051  VITAMINB12 297   Sepsis Labs: No results for input(s): PROCALCITON, LATICACIDVEN in the last 168 hours.  Recent Results (from the past 240 hour(s))  Culture, blood (routine x 2)     Status: None   Collection Time: 02/02/18  9:13 AM  Result Value Ref Range Status   Specimen Description   Final    BLOOD RIGHT ARM Performed at [facility] [address] [place], [place] [address]    Special Requests   Final    BOTTLES DRAWN AEROBIC AND ANAEROBIC Blood Culture adequate volume Performed at [facility] [address] [place], [place] [address]    Culture   Final    NO GROWTH 5 DAYS Performed at [facility], [place] [address], [place], [place] [address]    Report Status 02/07/2018 FINAL  Final  Culture, blood (routine x 2)     Status: None   Collection Time: 02/02/18  9:13 AM  Result Value Ref Range Status   Specimen Description   Final  BLOOD LEFT HAND Performed at [facility] [address] [place], [place] [address]    Special Requests   Final    BOTTLES DRAWN AEROBIC ONLY Blood Culture adequate volume Performed at [facility] [address], [place], [place] [address]    Culture   Final    NO GROWTH 5 DAYS Performed at [facility], [place] [address] [place], [place] [address]    Report Status 02/07/2018 FINAL  Final  MRSA PCR Screening     Status: None   Collection Time: 02/10/18  2:50 AM  Result Value Ref Range Status   MRSA by PCR NEGATIVE NEGATIVE Final    Comment:        The GeneXpert MRSA Assay (FDA approved for NASAL specimens only), is one component of a comprehensive MRSA colonization surveillance program. It is not intended to diagnose MRSA infection nor to guide or monitor treatment for MRSA infections. Performed at [facility], [place] [address] [place],  [place] [address]          Radiology Studies: Dg Chest 2 View  Result Date: 02/09/2018 CLINICAL DATA:  Mental status change.  Shortness of breath. EXAM: CHEST - 2 VIEW COMPARISON:  Chest x-rays dated 02/05/2018 and 02/03/2018. FINDINGS: Stable cardiomegaly. Stable small bilateral pleural effusions. Probable associated mild edema within the RIGHT lower lobe. No pneumothorax seen. RIGHT-sided PICC line is incompletely visualized, tip directed into the lower RIGHT neck. IMPRESSION: 1. PICC line has been displaced since the previous chest x-ray, with tip now directed upwards in the RIGHT neck (tip presumably in the RIGHT internal jugular vein). Recommend repositioning. 2. Stable small bilateral pleural effusions. Probable associated mild edema within the RIGHT lower lobe. 3. Stable cardiomegaly. Electronically Signed   By: Franki Cabot M.D.   On: 02/09/2018 17:45   Ct Head Wo Contrast  Result Date: 02/09/2018 CLINICAL DATA:  Confusion and delirium with altered level of consciousness. EXAM: CT HEAD WITHOUT CONTRAST TECHNIQUE: Contiguous axial images were obtained from the base of the skull through the vertex without intravenous contrast. COMPARISON:  CT exams dating back to 07/03/2017 FINDINGS: Brain: Atrophy with chronic moderate small vessel ischemia. General calcifications and along the right parietal lobe. Remote right pontine lacunar infarct. No large vascular territory infarction, hemorrhage or midline shift. No intra-axial mass nor extra-axial fluid. Midline fourth ventricle and basal cisterns. Vascular: Vascular calcifications along both vertebral and cavernous internal carotid arteries. No hyperdense vessel sign. Skull: No acute skull fracture. Sinuses/Orbits: Bilateral lens replacements. The paranasal sinuses mastoids. Other: Dystrophic intramuscular calcifications redemonstrated along the right infratemporal fossa. IMPRESSION: 1. Atrophy with chronic moderate small vessel ischemia. Chronic right  pontine lacunar infarct. 2. No acute intracranial abnormality. Electronically Signed   By: Ashley Royalty M.D.   On: 02/09/2018 20:06   Dg Chest Port 1 View  Result Date: 02/10/2018 CLINICAL DATA:  Right peripheral picc placement EXAM: PORTABLE CHEST 1 VIEW COMPARISON:  Chest x-ray dated 02/09/2018. FINDINGS: RIGHT-sided PICC line tip is still directed into the RIGHT lower neck, presumably within the internal jugular vein. Stable cardiomegaly. Overall cardiomediastinal silhouette is peers stable. Persistent opacity at the LEFT lung base, likely small pleural effusion and/or atelectasis. No new lung findings. No pneumothorax seen. IMPRESSION: 1. RIGHT-sided PICC line is again directed into the lower RIGHT neck with tip presumably within the internal jugular vein. 2. Persistent opacity at the LEFT lung base, compatible with the small LEFT pleural effusion and associated atelectasis demonstrated on  earlier chest CT of 02/04/2018. Probable small residual pleural effusion at the RIGHT lung base. No new lung findings. No pneumothorax. These results will be called to the ordering clinician or representative by the Radiologist Assistant, and communication documented in the PACS or zVision Dashboard. Electronically Signed   By: Franki Cabot M.D.   On: 02/10/2018 11:30   [place] Ekg Site Rite  Result Date: 02/10/2018 If Site Rite image not attached, placement could not be confirmed due to current cardiac rhythm.       Scheduled Meds: . aspirin EC  81 mg Oral Daily  . atenolol  50 mg Oral Daily  . DULoxetine  90 mg Oral q morning - 10a  . enoxaparin (LOVENOX) injection  40 mg Subcutaneous QHS  . gabapentin  200 mg Oral QHS  . potassium chloride  40 mEq Oral Once  . QUEtiapine  25 mg Oral QHS  . rosuvastatin  40 mg Oral q1800  . sodium chloride flush  3 mL Intravenous Q12H   Continuous Infusions: . sodium chloride 10 mL/hr at 02/11/18 0600  . sodium chloride 75 mL/hr at 02/11/18 1130  . ertapenem Stopped  (02/11/18 0004)  . magnesium sulfate 1 - 4 g bolus IVPB    . potassium chloride 10 mEq (02/11/18 1253)     LOS: 1 day    Time spent: 25 mins.More than 50% of that time was spent in counseling and/or coordination of care.      Shelly Coss, MD [facility] Pager [phone]  If 7PM-7AM, please contact night-coverage [email or web address] Password [email or web address] 02/11/2018, 1:08 PM

## 2018-02-11 NOTE — Progress Notes (Signed)
CRITICAL VALUE ALERT  Critical Value:  K+ 2.6  Date & Time Notied:  02/11/18 @ 0800  Provider Notified: yes  Orders Received/Actions taken: awaiting orders

## 2018-02-12 DIAGNOSIS — S0101XA Laceration without foreign body of scalp, initial encounter: Secondary | ICD-10-CM | POA: Diagnosis not present

## 2018-02-12 DIAGNOSIS — Z79899 Other long term (current) drug therapy: Secondary | ICD-10-CM | POA: Diagnosis not present

## 2018-02-12 DIAGNOSIS — Z7401 Bed confinement status: Secondary | ICD-10-CM | POA: Diagnosis not present

## 2018-02-12 DIAGNOSIS — E039 Hypothyroidism, unspecified: Secondary | ICD-10-CM | POA: Diagnosis not present

## 2018-02-12 DIAGNOSIS — Z1612 Extended spectrum beta lactamase (ESBL) resistance: Secondary | ICD-10-CM | POA: Diagnosis not present

## 2018-02-12 DIAGNOSIS — I7090 Unspecified atherosclerosis: Secondary | ICD-10-CM | POA: Diagnosis not present

## 2018-02-12 DIAGNOSIS — R609 Edema, unspecified: Secondary | ICD-10-CM | POA: Diagnosis not present

## 2018-02-12 DIAGNOSIS — M810 Age-related osteoporosis without current pathological fracture: Secondary | ICD-10-CM | POA: Diagnosis not present

## 2018-02-12 DIAGNOSIS — G894 Chronic pain syndrome: Secondary | ICD-10-CM | POA: Diagnosis not present

## 2018-02-12 DIAGNOSIS — R531 Weakness: Secondary | ICD-10-CM | POA: Diagnosis not present

## 2018-02-12 DIAGNOSIS — S199XXA Unspecified injury of neck, initial encounter: Secondary | ICD-10-CM | POA: Diagnosis not present

## 2018-02-12 DIAGNOSIS — E782 Mixed hyperlipidemia: Secondary | ICD-10-CM | POA: Diagnosis not present

## 2018-02-12 DIAGNOSIS — N10 Acute tubulo-interstitial nephritis: Secondary | ICD-10-CM | POA: Diagnosis not present

## 2018-02-12 DIAGNOSIS — G9340 Encephalopathy, unspecified: Secondary | ICD-10-CM | POA: Diagnosis not present

## 2018-02-12 DIAGNOSIS — M2550 Pain in unspecified joint: Secondary | ICD-10-CM | POA: Diagnosis not present

## 2018-02-12 DIAGNOSIS — G8929 Other chronic pain: Secondary | ICD-10-CM | POA: Diagnosis not present

## 2018-02-12 DIAGNOSIS — N390 Urinary tract infection, site not specified: Secondary | ICD-10-CM | POA: Diagnosis not present

## 2018-02-12 DIAGNOSIS — E785 Hyperlipidemia, unspecified: Secondary | ICD-10-CM | POA: Diagnosis not present

## 2018-02-12 DIAGNOSIS — F329 Major depressive disorder, single episode, unspecified: Secondary | ICD-10-CM | POA: Diagnosis not present

## 2018-02-12 DIAGNOSIS — E119 Type 2 diabetes mellitus without complications: Secondary | ICD-10-CM | POA: Diagnosis not present

## 2018-02-12 DIAGNOSIS — S22089D Unspecified fracture of T11-T12 vertebra, subsequent encounter for fracture with routine healing: Secondary | ICD-10-CM | POA: Diagnosis not present

## 2018-02-12 DIAGNOSIS — R0902 Hypoxemia: Secondary | ICD-10-CM | POA: Diagnosis not present

## 2018-02-12 DIAGNOSIS — I10 Essential (primary) hypertension: Secondary | ICD-10-CM | POA: Diagnosis not present

## 2018-02-12 DIAGNOSIS — D649 Anemia, unspecified: Secondary | ICD-10-CM | POA: Diagnosis not present

## 2018-02-12 DIAGNOSIS — R4182 Altered mental status, unspecified: Secondary | ICD-10-CM | POA: Diagnosis not present

## 2018-02-12 DIAGNOSIS — M1712 Unilateral primary osteoarthritis, left knee: Secondary | ICD-10-CM | POA: Diagnosis not present

## 2018-02-12 DIAGNOSIS — D518 Other vitamin B12 deficiency anemias: Secondary | ICD-10-CM | POA: Diagnosis not present

## 2018-02-12 DIAGNOSIS — I714 Abdominal aortic aneurysm, without rupture: Secondary | ICD-10-CM | POA: Diagnosis not present

## 2018-02-12 DIAGNOSIS — G9341 Metabolic encephalopathy: Secondary | ICD-10-CM | POA: Diagnosis not present

## 2018-02-12 DIAGNOSIS — B9629 Other Escherichia coli [E. coli] as the cause of diseases classified elsewhere: Secondary | ICD-10-CM | POA: Diagnosis not present

## 2018-02-12 DIAGNOSIS — F349 Persistent mood [affective] disorder, unspecified: Secondary | ICD-10-CM | POA: Diagnosis not present

## 2018-02-12 DIAGNOSIS — G4733 Obstructive sleep apnea (adult) (pediatric): Secondary | ICD-10-CM | POA: Diagnosis not present

## 2018-02-12 DIAGNOSIS — F0390 Unspecified dementia without behavioral disturbance: Secondary | ICD-10-CM | POA: Diagnosis not present

## 2018-02-12 DIAGNOSIS — I480 Paroxysmal atrial fibrillation: Secondary | ICD-10-CM | POA: Diagnosis not present

## 2018-02-12 DIAGNOSIS — E7800 Pure hypercholesterolemia, unspecified: Secondary | ICD-10-CM | POA: Diagnosis not present

## 2018-02-12 DIAGNOSIS — G9009 Other idiopathic peripheral autonomic neuropathy: Secondary | ICD-10-CM | POA: Diagnosis not present

## 2018-02-12 DIAGNOSIS — I482 Chronic atrial fibrillation, unspecified: Secondary | ICD-10-CM | POA: Diagnosis not present

## 2018-02-12 DIAGNOSIS — N1330 Unspecified hydronephrosis: Secondary | ICD-10-CM | POA: Diagnosis not present

## 2018-02-12 DIAGNOSIS — I959 Hypotension, unspecified: Secondary | ICD-10-CM | POA: Diagnosis not present

## 2018-02-12 DIAGNOSIS — S01112A Laceration without foreign body of left eyelid and periocular area, initial encounter: Secondary | ICD-10-CM | POA: Diagnosis not present

## 2018-02-12 DIAGNOSIS — E559 Vitamin D deficiency, unspecified: Secondary | ICD-10-CM | POA: Diagnosis not present

## 2018-02-12 DIAGNOSIS — M059 Rheumatoid arthritis with rheumatoid factor, unspecified: Secondary | ICD-10-CM | POA: Diagnosis not present

## 2018-02-12 LAB — BASIC METABOLIC PANEL
Anion gap: 11 (ref 5–15)
BUN: 21 mg/dL (ref 8–23)
CO2: 24 mmol/L (ref 22–32)
Calcium: 8.1 mg/dL — ABNORMAL LOW (ref 8.9–10.3)
Chloride: 105 mmol/L (ref 98–111)
Creatinine, Ser: 0.71 mg/dL (ref 0.44–1.00)
GFR calc Af Amer: >60 mL/min (ref >60)
GFR calc non Af Amer: >60 mL/min (ref >60)
Glucose, Bld: 95 mg/dL (ref 70–99)
Potassium: 4.4 mmol/L (ref 3.5–5.1)
Sodium: 140 mmol/L (ref 135–145)

## 2018-02-12 LAB — CBC WITH DIFFERENTIAL/PLATELET
Abs Immature Granulocytes: 0.03 10*3/uL (ref 0.00–0.07)
Basophils Absolute: 0.1 10*3/uL (ref 0.0–0.1)
Basophils Relative: 1 %
Eosinophils Absolute: 0.2 10*3/uL (ref 0.0–0.5)
Eosinophils Relative: 3 %
HCT: 33.9 % — ABNORMAL LOW (ref 36.0–46.0)
Hemoglobin: 10.4 g/dL — ABNORMAL LOW (ref 12.0–15.0)
Immature Granulocytes: 0 %
Lymphocytes Relative: 26 %
Lymphs Abs: 2.1 10*3/uL (ref 0.7–4.0)
MCH: 28.3 pg (ref 26.0–34.0)
MCHC: 30.7 g/dL (ref 30.0–36.0)
MCV: 92.4 fL (ref 80.0–100.0)
Monocytes Absolute: 0.5 10*3/uL (ref 0.1–1.0)
Monocytes Relative: 6 %
Neutro Abs: 5.3 10*3/uL (ref 1.7–7.7)
Neutrophils Relative %: 64 %
Platelets: 278 10*3/uL (ref 150–400)
RBC: 3.67 MIL/uL — ABNORMAL LOW (ref 3.87–5.11)
RDW: 13.4 % (ref 11.5–15.5)
WBC: 8.3 10*3/uL (ref 4.0–10.5)
nRBC: 0 % (ref 0.0–0.2)

## 2018-02-12 LAB — FOLATE RBC
Folate, Hemolysate: 449.4 ng/mL
Folate, RBC: 1338 ng/mL (ref >498)
Hematocrit: 33.6 % — ABNORMAL LOW (ref 34.0–46.6)

## 2018-02-12 MED ORDER — QUETIAPINE FUMARATE 25 MG PO TABS: 25.0000 mg | ORAL_TABLET | Freq: Every day | ORAL | 0 refills | Status: DC

## 2018-02-12 MED ORDER — GABAPENTIN 100 MG PO CAPS: 200.0000 mg | ORAL_CAPSULE | Freq: Every day | ORAL | 2 refills | Status: DC

## 2018-02-12 MED ORDER — OXYMORPHONE HCL 5 MG PO TABS: 5.0000 mg | ORAL_TABLET | Freq: Three times a day (TID) | ORAL | 0 refills | Status: DC

## 2018-02-12 MED ORDER — SODIUM CHLORIDE 0.9 % IV SOLN: 1.0000 g | Freq: Every morning | INTRAVENOUS | 0 refills | Status: AC

## 2018-02-12 MED ORDER — METOPROLOL SUCCINATE ER 50 MG PO TB24: 50.0000 mg | ORAL_TABLET | Freq: Every day | ORAL | 0 refills | Status: DC

## 2018-02-12 MED ORDER — SODIUM CHLORIDE 0.9 % IV SOLN
1.0000 g | Freq: Every day | INTRAVENOUS | Status: DC
  Administered 2018-02-12: 1000 mg via INTRAVENOUS
  Filled 2018-02-12: qty 1

## 2018-02-12 MED ORDER — METOPROLOL SUCCINATE ER 50 MG PO TB24
50.0000 mg | ORAL_TABLET | Freq: Every day | ORAL | Status: DC
  Administered 2018-02-12: 50 mg via ORAL
  Filled 2018-02-12: qty 1

## 2018-02-12 NOTE — Clinical Social Work Placement (Signed)
Patient received and accepted bed offer at [facility]. Facility aware of discharge and confirmed bed offer. PTAR contacted, patient's family notified. Patient's RN can call report to [phone], packet complete. CSW signing off, no other needs identified at this time.  CLINICAL SOCIAL WORK PLACEMENT  NOTE  Date:  02/12/2018  Patient Details  Name: [name] MRN: [id number] Date of Birth: 1934-05-02  Clinical Social Work is seeking post-discharge placement for this patient at the [place] level of care (*CSW will initial, date and re-position this form in  chart as items are completed):  Yes   Patient/family provided with [place] Work Department's list of facilities offering this level of care within the geographic area requested by the patient (or if unable, by the patient's family).  Yes   Patient/family informed of their freedom to choose among providers that offer the needed level of care, that participate in Medicare, Medicaid or managed care program needed by the patient, have an available bed and are willing to accept the patient.  Yes   Patient/family informed of [facility]'s ownership interest in [facility] and [facility], as well as of the fact that they are under no obligation to receive care at these facilities.  PASRR submitted to EDS on       PASRR number received on       Existing PASRR number confirmed on 02/12/18     FL2 transmitted to all facilities in geographic area requested by pt/family on 02/12/18     FL2 transmitted to all facilities within larger geographic area on       Patient informed that his/her managed care company has contracts with or will negotiate with certain facilities, including the following:        Yes   Patient/family informed of bed offers received.  Patient chooses bed at [facility]     Physician recommends and patient chooses bed at      Patient to be transferred to [facility] on 02/12/18.  Patient to be transferred to facility by PTAR     Patient family notified on 02/12/18 of transfer.  Name of family member notified:  [name]     PHYSICIAN       Additional Comment:    _______________________________________________ Burnis Medin, LCSW 02/12/2018, 1:46 PM

## 2018-02-12 NOTE — Clinical Social Work Note (Signed)
Patient recently assessed on 02/06/18, no psychosocial changes reported. Patient discharged 02/09/18 and readmitted 02/09/18. CSW spoke with patient's son Audelia Acton) who confirmed plan for patient to discharge back to SNF. Patient's son requested that patient discharge to a new SNF [facility]). CSW agreed to complete patient's FL2 and follow up with preferred SNF. CSW contacted [facility] and spoke with staff member Larene Beach, staff agreed to review patient's referral and contact CSW with an answer. CSW will continue to follow and assist with discharge planning.  Clinical Social Work Assessment  Patient Details  Name: [name] MRN: [id number] Date of Birth: 1933-10-16  Date of referral:                  Reason for consult:                   Permission sought to share information with:    Permission granted to share information::     Name::        Agency::     Relationship::     Contact Information:     Housing/Transportation Living arrangements for the past 2 months:    Source of Information:    Patient Interpreter Needed:    Criminal Activity/Legal Involvement Pertinent to Current Situation/Hospitalization:    Significant Relationships:    Lives with:    Do you feel safe going back to the place where you live?    Need for family participation in patient care:     Care giving concerns:  Patient's daughter reported that patient is from home with son. Patient's daughter reported that patient requires assistance with ADLs and uses a walker to ambulate. Patient recently discharged home with home health (IV antibiotics). Patient admitted with principal problem "hypoxemic respiratory failure". PT recommending SNF.   Social Worker assessment / plan:  CSW spoke with patient's daughter at bedside regarding discharge planning and SNF recommendation, patient asleep. Patient's daughter reported that patient has been to SNF in the past and that she prefers that patient [facility] to  [facility]. CSW explained SNF placement process and medicare coverage, patient's daughter verbalized understanding. CSW inquired about patient's living environment, patient's daughter reported no concerns and reported that the patient having bed bugs is a new isolated event. Per chart review, patient's son reported that he was going to treat for the bed bugs. Patient's daughter reported no concerns about patient's living environment and reported that patient likes living with her son. CSW agreed to follow up with preferred SNF. Patient's daughter reported that it is the only place they want patient to go to.   CSW completed FL2 and followed up with [facility] admissions staff [name]. Staff reported that they will review patient's referral and agreed to contact CSW with an update on their ability to accept patient.  CSW will continue to follow and assist with discharge planning.  Employment status:    Insurance information:    PT Recommendations:    Information / Referral to community resources:     Patient/Family's Response to care:  Patient's daughter appreciative of CSW assistance with discharge planning.   Patient/Family's Understanding of and Emotional Response to Diagnosis, Current Treatment, and Prognosis:  Patient's daughter involved in patient's care and verbalized plan for patient to discharge to SNF for ST rehab prior to returning home. Patient's daughter declined any concerns about patient's living environment.   Emotional Assessment Appearance:    Attitude/Demeanor/Rapport:    Affect (typically observed):  Orientation:    Alcohol / Substance use:    Psych involvement (Current and /or in the community):     Discharge Needs  Concerns to be addressed:    Readmission within the last 30 days:  Yes Current discharge risk:    Barriers to Discharge:      Burnis Medin, LCSW 02/12/2018, 11:15 AM

## 2018-02-12 NOTE — Progress Notes (Signed)
PROGRESS NOTE    [name]  GEZ:[id number] DOB: August 03, 1933 DOA: 02/09/2018 PCP: [name], MD   Brief Narrative: Patient is a 84 year old female with past medical history of A. fib but not on anti-coagulation, dementia, chronic pain syndrome who was recently admitted here for sepsis secondary to pyelonephritis complicated by obstructing ureteral stone treated with a stent, ESBL bacteremia receiving ertapenem who presented from skilled nursing facility for the evaluation of increased confusion and hallucination.  Patient admitted for the management of acute encephalopathy.  Assessment & Plan:   Principal Problem:   Acute encephalopathy Active Problems:   Essential hypertension   OSA (obstructive sleep apnea)   Dementia (HCC)   Chronic pain syndrome   Hydronephrosis with renal and ureteral calculus obstruction   Urinary tract infection due to extended-spectrum beta lactamase (ESBL) producing Escherichia coli   Chronic a-fib   Complication associated with peripherally inserted central catheter (PICC)  Acute encephalopathy:Mental status much improved and currently she is alert and oriented. Presented with hallucination and increased confusion.  She was encephalopathic during recent hospitalization.CT head negative for any intra-cranial pathology.  Most likely this could be associated with her ongoing metabolic/septic etiology or delirium or dementia.  Reduced the dose of  gabapentin.  Started on low dose Seroquel.  Displaced PICC line: She has a PICC line on her right arm which is apparently displaced and was removed.  A. fib with RVR: Heart rate under control right now..  She is on atenolol at home and will change that to metoprolol.  ESBL bacteremia:Sepsis secondary to pyelonephritis complicated by obstructing ureteral stone on right treated with stent   She was found to have ESBL in urine and blood, repeat blood cultures on 10/4 remained negative, PICC was placed, and she  has been continued on ertapenem at SNF with end-date planned for 02/14/18.  Continue ertapenem  Currently she is afebrile.  Nephrolithiasis with ureteral obstruction: Status post stenting on last admission.  Follow-up with urology after discharge.  Chronic pain syndrome: On bunch  of pain medications which were adjusted due to encephalopathy.  Minimize narcotics as much as possible  OSA: Continue CPAP at night  Hematuria: Patient was having gross hematuria.  Suspect from traumatic catheterization. Foley catheter will be removed.   CT abdomen/pelvis did not show obvious pathology for hematuria.    DVT prophylaxis: SCD Code Status: Full Family Communication:Son at the bed side  Disposition Plan: Patient's children requesting for new skilled nursing facility.  Social worker made aware.  Patient can be discharged to skilled nursing facility as soon as the bed is available.   Consultants: None  Procedures:None  Antimicrobials:Ertapenem.Last day is 02/14/18  Subjective: Patient seen and examined the bedside this morning.  Her mental status  has improved this morning. Hematuria has resolved.  No overnight fever, nausea or vomiting.  Objective: Vitals:   02/11/18 2253 02/12/18 0404 02/12/18 0503 02/12/18 0610  BP:   (!) 142/98   Pulse: (!) 107  84   Resp: 20  18   Temp:  98.2 F (36.8 C) 97.6 F (36.4 C)   TempSrc:  Oral Oral   SpO2: 95%  97%   Weight:    75.6 kg    Intake/Output Summary (Last 24 hours) at 02/12/2018 1147 Last data filed at 02/12/2018 0634 Gross per 24 hour  Intake 1727.98 ml  Output 895 ml  Net 832.98 ml   Filed Weights   02/11/18 0609 02/12/18 0610  Weight: 73.4 kg 75.6 kg  Examination:  General exam: Not in distress,average built HEENT:PERRL,Oral mucosa moist, Ear/Nose normal on gross exam Respiratory system: Bilateral decreased air entry in the bases cardiovascular system: Afib . No JVD, murmurs, rubs, gallops or clicks. No pedal  edema. Gastrointestinal system: Abdomen is nondistended, soft and nontender. No organomegaly or masses felt. Normal bowel sounds heard. Central nervous system: Alert and oriented. No focal neurological deficits.   Extremities: No edema, no clubbing ,no cyanosis, distal peripheral pulses palpable. Skin: No rashes, lesions or ulcers,no icterus ,no pallor   Data Reviewed: I have personally reviewed following labs and imaging studies  CBC: Recent Labs  Lab 02/09/18 1651 02/10/18 0051 02/11/18 0608 02/12/18 0734  WBC 11.8* 15.9* 9.7 8.3  NEUTROABS 9.5* 13.3* 6.6 5.3  HGB 11.0* 11.5* 11.1* 10.4*  HCT 35.2* 36.5 35.4* 33.9*  MCV 92.6 90.1 90.3 92.4  PLT 268 304 305 932   Basic Metabolic Panel: Recent Labs  Lab 02/09/18 0521 02/09/18 1651 02/10/18 0051 02/11/18 0608 02/11/18 1541 02/12/18 0734  NA 143 143 140 143  --  140  K 3.2* 3.6 3.5 2.6* 4.7 4.4  CL 102 105 101 99  --  105  CO2 27 25 22 29   --  24  GLUCOSE 107* 111* 120* 92  --  95  BUN 20 18 16 22   --  21  CREATININE 0.76 0.75 0.72 0.84  --  0.71  CALCIUM 8.7* 8.9 8.6* 8.7*  --  8.1*  MG  --   --   --  1.8  --   --    GFR: Estimated Creatinine Clearance: 54 mL/min (by C-G formula based on SCr of 0.71 mg/dL). Liver Function Tests: Recent Labs  Lab 02/09/18 1651 02/10/18 0051  AST 21 22  ALT 12 13  ALKPHOS 60 59  BILITOT 1.4* 1.4*  PROT 6.9 6.9  ALBUMIN 2.8* 2.7*   No results for input(s): LIPASE, AMYLASE in the last 168 hours. Recent Labs  Lab 02/10/18 0051  AMMONIA 15   Coagulation Profile: No results for input(s): INR, PROTIME in the last 168 hours. Cardiac Enzymes: No results for input(s): CKTOTAL, CKMB, CKMBINDEX, TROPONINI in the last 168 hours. BNP (last 3 results) No results for input(s): PROBNP in the last 8760 hours. HbA1C: No results for input(s): HGBA1C in the last 72 hours. CBG: No results for input(s): GLUCAP in the last 168 hours. Lipid Profile: No results for input(s): CHOL, HDL,  LDLCALC, TRIG, CHOLHDL, LDLDIRECT in the last 72 hours. Thyroid Function Tests: Recent Labs    02/10/18 0051  TSH 3.641   Anemia Panel: Recent Labs    02/10/18 0051  VITAMINB12 297   Sepsis Labs: No results for input(s): PROCALCITON, LATICACIDVEN in the last 168 hours.  Recent Results (from the past 240 hour(s))  MRSA PCR Screening     Status: None   Collection Time: 02/10/18  2:50 AM  Result Value Ref Range Status   MRSA by PCR NEGATIVE NEGATIVE Final    Comment:        The GeneXpert MRSA Assay (FDA approved for NASAL specimens only), is one component of a comprehensive MRSA colonization surveillance program. It is not intended to diagnose MRSA infection nor to guide or monitor treatment for MRSA infections. Performed at [facility], [place] [address], [place], [place] [address]          Radiology Studies: Ct Abdomen Pelvis W Wo Contrast  Result Date: 02/11/2018 CLINICAL DATA:  Patient with sepsis.  Pyelonephritis. EXAM:  CT ABDOMEN AND PELVIS WITHOUT AND WITH CONTRAST TECHNIQUE: Multidetector CT imaging of the abdomen and pelvis was performed following the standard protocol before and following the bolus administration of intravenous contrast. CONTRAST:  131mL ISOVUE-300 IOPAMIDOL (ISOVUE-300) INJECTION 61% COMPARISON:  CT abdomen pelvis 01/30/2018 FINDINGS: Lower chest: Heart is enlarged. Coronary arterial vascular calcifications. Moderate layering bilateral pleural effusions. Subpleural consolidation within the lower lobes bilaterally favored to represent atelectasis. Hepatobiliary: The liver is normal in size and contour. No focal hepatic lesions identified. Gallbladder is surgically absent. Pancreas: Unremarkable Spleen: Unremarkable Adrenals/Urinary Tract: Normal adrenal glands. Noncontrast images demonstrate a 4 mm stone within the superior aspect of the right renal collecting system (image 26; series 2). Mild right pelviectasis. Right double-J  nephroureteral stent is in place. Kidneys enhance symmetrically with contrast. There is right renal collecting system and ureteral urothelial thickening and hyperenhancement. Urinary bladder is decompressed with a Foley catheter. Small amount of gas within the urinary bladder. Stomach/Bowel: Sigmoid colonic diverticulosis. No CT evidence for acute diverticulitis. No evidence for small bowel obstruction. No free fluid or free intraperitoneal air. Normal morphology of the stomach. Vascular/Lymphatic: Tortuous dilated abdominal aorta measuring up to 3.9 cm (image 29; series 5). Peripheral calcified atherosclerotic plaque. Atherosclerotic narrowing of the proximal aspect of the celiac axis and superior mesenteric artery. No retroperitoneal lymphadenopathy. Reproductive: Status post hysterectomy. Other: None. Musculoskeletal: Similar-appearing T12 and L5 subacute compression deformities. IMPRESSION: 1. Interval insertion of right-sided double-J nephroureteral stent. Persistent mild right pelviectasis. There is hyperenhancement and thickening of the right renal collecting system and ureteral urothelium which may be reactive in etiology. Superimposed infection not excluded. 2. 4 mm stone superior pole right renal collecting system. 3. Abdominal aorta measures 3.9 cm. Recommend followup by ultrasound in 2 years. This recommendation follows ACR consensus guidelines: White Paper of the ACR Incidental Findings Committee II on Vascular Findings. J Am Coll Radiol 2013; 10:789-794. 4. Sigmoid colonic diverticulosis without evidence for acute diverticulitis. 5. T12 and L5 subacute compression deformities. Electronically Signed   By: Lovey Newcomer M.D.   On: 02/11/2018 16:32   [place] Ekg Site Rite  Result Date: 02/10/2018 If Site Rite image not attached, placement could not be confirmed due to current cardiac rhythm.       Scheduled Meds: . aspirin EC  81 mg Oral Daily  . DULoxetine  90 mg Oral q morning - 10a  .  gabapentin  200 mg Oral QHS  . metoprolol succinate  50 mg Oral Daily  . QUEtiapine  25 mg Oral QHS  . rosuvastatin  40 mg Oral q1800  . sodium chloride flush  3 mL Intravenous Q12H   Continuous Infusions: . sodium chloride 10 mL/hr at 02/11/18 0600  . ertapenem 1,000 mg (02/11/18 2147)     LOS: 2 days    Time spent: 25 mins.More than 50% of that time was spent in counseling and/or coordination of care.      Shelly Coss, MD [facility] Pager [phone]  If 7PM-7AM, please contact night-coverage [email or web address] Password [email or web address] 02/12/2018, 11:47 AM

## 2018-02-12 NOTE — Progress Notes (Signed)
Attempted to call report to [facility] [phone]), spoke with Collie Siad who placed me on hold to transfer to nurse, waited on hold for three minutes before being disconnected

## 2018-02-12 NOTE — NC FL2 (Signed)
[place] MEDICAID FL2 LEVEL OF CARE SCREENING TOOL     IDENTIFICATION  Patient Name: LAELA DEVINEY Birthdate: 20-May-1933 Sex: female Admission Date (Current Location): 02/09/2018  [facility] and [place] Number:  Herbalist and Address:  [facility],  [place] [address], [place]      Provider Number: [id number]  Attending Physician Name and Address:  Shelly Coss, MD  Relative Name and Phone Number:       Current Level of Care: Hospital Recommended Level of Care: [place] Prior Approval Number:    Date Approved/Denied:   PASRR Number: [id number]  Discharge Plan: SNF    Current Diagnoses: Patient Active Problem List   Diagnosis Date Noted  . Complication associated with peripherally inserted central catheter (PICC) 02/10/2018  . Acute delirium 02/09/2018  . Acute encephalopathy 02/09/2018  . Acute hypoxemic respiratory failure ([place]) 02/05/2018  . Acute on chronic respiratory failure with hypoxia ([place]) 02/03/2018  . Infestation by bed bug 02/03/2018  . Urinary tract infection due to extended-spectrum beta lactamase (ESBL) producing Escherichia coli 02/02/2018  . Chronic a-fib 02/02/2018  . Acute pyelonephritis 01/31/2018  . Constipation 01/31/2018  . Urolithiasis 01/30/2018  . Hydronephrosis with renal and ureteral calculus obstruction 01/30/2018  . Fall   . T12 compression fracture ([place]) 09/04/2017  . E coli bacteremia 07/03/2017  . Fall at home, initial encounter 07/03/2017  . Tachycardia 02/03/2017  . Sepsis secondary to UTI ([place]) 02/02/2017  . Chronic pain syndrome 02/02/2017  . Abnormal EKG 11/21/2013  . [name] (abdominal aortic aneurysm) ([place]) 09/30/2013  . OSA (obstructive sleep apnea) 06/29/2012  . Dementia ([place]) 06/29/2012  . Pre-diabetes 06/29/2012  . Hypoxia, sleep related 06/29/2012  . RA (rheumatoid arthritis) ([place]) 06/29/2012  . Nonspecific abnormal results of cardiovascular function study 06/02/2011   . Nonspecific abnormal electrocardiogram (ECG) (EKG) 05/18/2011  . OSTEOPOROSIS 04/09/2008  . FLANK PAIN, RIGHT 03/04/2008  . Hyperlipidemia 05/12/2006  . PYELONEPHRITIS, ACUTE 05/12/2006  . DEPENDENT EDEMA, LEGS, BILATERAL 05/12/2006  . URINARY INCONTINENCE 05/12/2006  . PERIPHERAL NEUROPATHY 03/11/2006  . Essential hypertension 03/11/2006  . DYSURIA 03/11/2006    Orientation RESPIRATION BLADDER Height & Weight     Self  Other (Comment), Normal(CPAP Medium Adult Full Face Mask 2l/min) Incontinent, Indwelling catheter Weight: 166 lb 10.7 oz (75.6 kg) Height:     BEHAVIORAL SYMPTOMS/MOOD NEUROLOGICAL BOWEL NUTRITION STATUS      Incontinent Diet(see dc summary)  AMBULATORY STATUS COMMUNICATION OF NEEDS Skin   Extensive Assist Verbally Normal                       Personal Care Assistance Level of Assistance  Bathing, Feeding, Dressing Bathing Assistance: Maximum assistance Feeding assistance: Independent Dressing Assistance: Maximum assistance     Functional Limitations Info  Sight, Hearing, Speech Sight Info: Adequate Hearing Info: Adequate Speech Info: Adequate    SPECIAL CARE FACTORS FREQUENCY  PT (By licensed PT), OT (By licensed OT)     PT Frequency: Min 3x/weeks OT Frequency: Min 3x/week            Contractures      Additional Factors Info  Code Status, Allergies, Isolation Precautions Code Status Info: Full Code Allergies Info: Sulfonamide Derivatives     Isolation Precautions Info: Contact Precautions Infection: ESBL     Current Medications (02/12/2018):  This is the current hospital active medication list Current Facility-Administered Medications  Medication Dose Route Frequency Provider Last Rate Last Dose  . 0.9 %  sodium chloride infusion  250 mL Intravenous PRN Opyd, Ilene Qua, MD 10 mL/hr at 02/11/18 0600    . 0.9 %  sodium chloride infusion   Intravenous Continuous Shelly Coss, MD 75 mL/hr at 02/12/18 0600    . acetaminophen  (TYLENOL) tablet 650 mg  650 mg Oral Q6H PRN Opyd, Ilene Qua, MD   650 mg at 02/10/18 1818   Or  . acetaminophen (TYLENOL) suppository 650 mg  650 mg Rectal Q6H PRN Opyd, Ilene Qua, MD      . aspirin EC tablet 81 mg  81 mg Oral Daily Opyd, Ilene Qua, MD   81 mg at 02/12/18 0924  . atenolol (TENORMIN) tablet 50 mg  50 mg Oral Daily Opyd, Ilene Qua, MD   50 mg at 02/12/18 0924  . bisacodyl (DULCOLAX) suppository 10 mg  10 mg Rectal Daily PRN Opyd, Ilene Qua, MD      . DULoxetine (CYMBALTA) DR capsule 90 mg  90 mg Oral q morning - 10a Opyd, Ilene Qua, MD   90 mg at 02/12/18 0924  . ertapenem (INVANZ) 1,000 mg in sodium chloride 0.9 % 100 mL IVPB  1 g Intravenous QHS Opyd, Ilene Qua, MD 200 mL/hr at 02/11/18 2147 1,000 mg at 02/11/18 2147  . gabapentin (NEURONTIN) capsule 200 mg  200 mg Oral QHS Adhikari, Amrit, MD   200 mg at 02/11/18 2146  . haloperidol lactate (HALDOL) injection 1 mg  1 mg Intramuscular Q6H PRN Opyd, Ilene Qua, MD      . hydrALAZINE (APRESOLINE) injection 10 mg  10 mg Intravenous Q4H PRN Opyd, Ilene Qua, MD   10 mg at 02/10/18 0258  . morphine (MSIR) tablet 15 mg  15 mg Oral Q8H PRN Opyd, Ilene Qua, MD      . ondansetron (ZOFRAN) tablet 4 mg  4 mg Oral Q6H PRN Opyd, Ilene Qua, MD       Or  . ondansetron (ZOFRAN) injection 4 mg  4 mg Intravenous Q6H PRN Opyd, Ilene Qua, MD      . QUEtiapine (SEROQUEL) tablet 25 mg  25 mg Oral QHS Shelly Coss, MD   25 mg at 02/11/18 2146  . rosuvastatin (CRESTOR) tablet 40 mg  40 mg Oral q1800 Opyd, Ilene Qua, MD   40 mg at 02/11/18 1722  . senna-docusate (Senokot-S) tablet 1 tablet  1 tablet Oral QHS PRN Opyd, Ilene Qua, MD      . sodium chloride flush (NS) 0.9 % injection 3 mL  3 mL Intravenous Q12H Opyd, Ilene Qua, MD   3 mL at 02/11/18 2149  . sodium chloride flush (NS) 0.9 % injection 3 mL  3 mL Intravenous PRN Opyd, Ilene Qua, MD         Discharge Medications: Please see discharge summary for a list of discharge  medications.  Relevant Imaging Results:  Relevant Lab Results:   Additional Information ssn:[id number]      Burnis Medin, LCSW

## 2018-02-12 NOTE — Discharge Summary (Addendum)
Physician Discharge Summary  [name] KNL:[id number] DOB: 1934/03/19 DOA: 02/09/2018  PCP: [name], MD  Admit date: 02/09/2018 Discharge date: 02/12/2018  Admitted From:SNF Disposition:  SNF  Discharge Condition:Stable CODE STATUS:FULL Diet recommendation: Heart Healthy   Brief/Interim Summary: Patient is a 84 year old female with past medical history of A. fib but not on anti-coagulation, dementia, chronic pain syndrome who was recently admitted here for sepsis secondary to pyelonephritis complicated by obstructing ureteral stone treated with a stent, ESBL bacteremia receiving ertapenem who presented from skilled nursing facility for the evaluation of increased confusion and hallucination.  Patient admitted for the management of acute encephalopathy. Patient's mental status significantly improved after hospitalization.  She is stable for discharge to the skilled nursing facility today.  Following problems were addressed during hospitalization:  Acute encephalopathy:Mental status much improved and currently she is alert and oriented. Presented with hallucination and increased confusion.  She was encephalopathic during recent hospitalization.CT head negative for any intra-cranial pathology.  Most likely this could be associated with her ongoing metabolic/septic etiology or delirium or dementia.  Reduced the dose of  gabapentin.  Started on low dose Seroquel.  Displaced PICC line: She has a PICC line on her right arm which is apparently displaced and was removed.  A. fib with RVR: Heart rate under control right now..  She is on atenolol at home and will change that to metoprolol.  ESBL bacteremia:Sepsis secondary to pyelonephritis complicated by obstructing ureteral stone on right treated with stent  She was found to have ESBL in urine and blood, repeat blood cultures on 10/4 remained negative, PICC was placed, and she has been continued on ertapenem at SNF with end-date  planned for 02/14/18.  Continue ertapenem through peripheral line for 2 more days from tomorrow. Currently she is afebrile.  Nephrolithiasis with ureteral obstruction: Status post stenting on last admission.  Follow-up with urology after discharge.  Chronic pain syndrome: On bunch  of pain medications which were adjusted due to encephalopathy.  Minimize narcotics as much as possible  OSA: Continue CPAP at night at 10 mmHg and 5 mmHg  Hematuria: Patient was having gross hematuria.  Suspect from traumatic catheterization. Foley catheter will be removed.   CT abdomen/pelvis did not show obvious pathology for hematuria.     Discharge Diagnoses:  Principal Problem:   Acute encephalopathy Active Problems:   Essential hypertension   OSA (obstructive sleep apnea)   Dementia (HCC)   Chronic pain syndrome   Hydronephrosis with renal and ureteral calculus obstruction   Urinary tract infection due to extended-spectrum beta lactamase (ESBL) producing Escherichia coli   Chronic a-fib   Complication associated with peripherally inserted central catheter (PICC)    Discharge Instructions  Discharge Instructions    Diet - low sodium heart healthy   Complete by:  As directed    Discharge instructions   Complete by:  As directed    1) Please take prescribed medications as instructed. 2) Please follow up with urology in a week.  Name and number of the provider group has been attached. 3)Do a CBC and BMP tests in a week.   Increase activity slowly   Complete by:  As directed      Allergies as of 02/12/2018      Reactions   Sulfonamide Derivatives Hives, Rash      Medication List    STOP taking these medications   atenolol 50 MG tablet Commonly known as:  TENORMIN   ertapenem  IVPB Commonly known  as:  INVANZ     TAKE these medications   aspirin EC 81 MG tablet Take 81 mg by mouth daily.   DULoxetine 30 MG capsule Commonly known as:  CYMBALTA Take 90 mg by mouth every  morning.   ertapenem 1,000 mg in sodium chloride 0.9 % 100 mL Inject 1,000 mg into the vein every morning for 2 doses. Start taking on:  02/13/2018   gabapentin 100 MG capsule Commonly known as:  NEURONTIN Take 2 capsules (200 mg total) by mouth at bedtime. What changed:    medication strength  how much to take   metoprolol succinate 50 MG 24 hr tablet Commonly known as:  TOPROL-XL Take 1 tablet (50 mg total) by mouth daily. Take with or immediately following a meal.   oxymorphone 5 MG tablet Commonly known as:  OPANA Take 1 tablet (5 mg total) by mouth 3 (three) times daily.   QUEtiapine 25 MG tablet Commonly known as:  SEROQUEL Take 1 tablet (25 mg total) by mouth at bedtime.   rosuvastatin 40 MG tablet Commonly known as:  CRESTOR TAKE 1 TABLET BY MOUTH EVERY DAY       Contact information for follow-up providers    Ardis Hughs, MD. Schedule an appointment as soon as possible for a visit in 1 week(s).   Specialty:  Urology Contact information: [address] [place] [address] [phone]            Contact information for after-discharge care    Destination    [facility] .   Service:  Skilled Nursing Contact information: [place] [phone]                 Allergies  Allergen Reactions  . Sulfonamide Derivatives Hives and Rash    Consultations: None  Procedures/Studies: Ct Abdomen Pelvis W Wo Contrast  Result Date: 02/11/2018 CLINICAL DATA:  Patient with sepsis.  Pyelonephritis. EXAM: CT ABDOMEN AND PELVIS WITHOUT AND WITH CONTRAST TECHNIQUE: Multidetector CT imaging of the abdomen and pelvis was performed following the standard protocol before and following the bolus administration of intravenous contrast. CONTRAST:  165mL ISOVUE-300 IOPAMIDOL (ISOVUE-300) INJECTION 61% COMPARISON:  CT abdomen pelvis 01/30/2018 FINDINGS: Lower chest: Heart is enlarged. Coronary arterial  vascular calcifications. Moderate layering bilateral pleural effusions. Subpleural consolidation within the lower lobes bilaterally favored to represent atelectasis. Hepatobiliary: The liver is normal in size and contour. No focal hepatic lesions identified. Gallbladder is surgically absent. Pancreas: Unremarkable Spleen: Unremarkable Adrenals/Urinary Tract: Normal adrenal glands. Noncontrast images demonstrate a 4 mm stone within the superior aspect of the right renal collecting system (image 26; series 2). Mild right pelviectasis. Right double-J nephroureteral stent is in place. Kidneys enhance symmetrically with contrast. There is right renal collecting system and ureteral urothelial thickening and hyperenhancement. Urinary bladder is decompressed with a Foley catheter. Small amount of gas within the urinary bladder. Stomach/Bowel: Sigmoid colonic diverticulosis. No CT evidence for acute diverticulitis. No evidence for small bowel obstruction. No free fluid or free intraperitoneal air. Normal morphology of the stomach. Vascular/Lymphatic: Tortuous dilated abdominal aorta measuring up to 3.9 cm (image 29; series 5). Peripheral calcified atherosclerotic plaque. Atherosclerotic narrowing of the proximal aspect of the celiac axis and superior mesenteric artery. No retroperitoneal lymphadenopathy. Reproductive: Status post hysterectomy. Other: None. Musculoskeletal: Similar-appearing T12 and L5 subacute compression deformities. IMPRESSION: 1. Interval insertion of right-sided double-J nephroureteral stent. Persistent mild right pelviectasis. There is hyperenhancement and thickening of the right  renal collecting system and ureteral urothelium which may be reactive in etiology. Superimposed infection not excluded. 2. 4 mm stone superior pole right renal collecting system. 3. Abdominal aorta measures 3.9 cm. Recommend followup by ultrasound in 2 years. This recommendation follows ACR consensus guidelines: White Paper of  the ACR Incidental Findings Committee II on Vascular Findings. J Am Coll Radiol 2013; 10:789-794. 4. Sigmoid colonic diverticulosis without evidence for acute diverticulitis. 5. T12 and L5 subacute compression deformities. Electronically Signed   By: Lovey Newcomer M.D.   On: 02/11/2018 16:32   Dg Chest 2 View  Result Date: 02/09/2018 CLINICAL DATA:  Mental status change.  Shortness of breath. EXAM: CHEST - 2 VIEW COMPARISON:  Chest x-rays dated 02/05/2018 and 02/03/2018. FINDINGS: Stable cardiomegaly. Stable small bilateral pleural effusions. Probable associated mild edema within the RIGHT lower lobe. No pneumothorax seen. RIGHT-sided PICC line is incompletely visualized, tip directed into the lower RIGHT neck. IMPRESSION: 1. PICC line has been displaced since the previous chest x-ray, with tip now directed upwards in the RIGHT neck (tip presumably in the RIGHT internal jugular vein). Recommend repositioning. 2. Stable small bilateral pleural effusions. Probable associated mild edema within the RIGHT lower lobe. 3. Stable cardiomegaly. Electronically Signed   By: Franki Cabot M.D.   On: 02/09/2018 17:45   Ct Head Wo Contrast  Result Date: 02/09/2018 CLINICAL DATA:  Confusion and delirium with altered level of consciousness. EXAM: CT HEAD WITHOUT CONTRAST TECHNIQUE: Contiguous axial images were obtained from the base of the skull through the vertex without intravenous contrast. COMPARISON:  CT exams dating back to 07/03/2017 FINDINGS: Brain: Atrophy with chronic moderate small vessel ischemia. General calcifications and along the right parietal lobe. Remote right pontine lacunar infarct. No large vascular territory infarction, hemorrhage or midline shift. No intra-axial mass nor extra-axial fluid. Midline fourth ventricle and basal cisterns. Vascular: Vascular calcifications along both vertebral and cavernous internal carotid arteries. No hyperdense vessel sign. Skull: No acute skull fracture.  Sinuses/Orbits: Bilateral lens replacements. The paranasal sinuses mastoids. Other: Dystrophic intramuscular calcifications redemonstrated along the right infratemporal fossa. IMPRESSION: 1. Atrophy with chronic moderate small vessel ischemia. Chronic right pontine lacunar infarct. 2. No acute intracranial abnormality. Electronically Signed   By: Ashley Royalty M.D.   On: 02/09/2018 20:06   Ct Angio Chest Pe W And/or Wo Contrast  Result Date: 02/04/2018 CLINICAL DATA:  Complex chest pain, intermediate clinical probability for pulmonary embolism, hypoxia, history atrial fibrillation, dementia, diabetes mellitus, hypertension EXAM: CT ANGIOGRAPHY CHEST WITH CONTRAST TECHNIQUE: Multidetector CT imaging of the chest was performed using the standard protocol during bolus administration of intravenous contrast. Multiplanar CT image reconstructions and MIPs were obtained to evaluate the vascular anatomy. CONTRAST:  132mL ISOVUE-370 IOPAMIDOL (ISOVUE-370) INJECTION 76% IV COMPARISON:  09/04/2017 FINDINGS: Cardiovascular: Extensive atherosclerotic calcifications aorta, proximal great vessels and coronary arteries. Ascending thoracic aorta upper normal caliber. Pulmonary arteries well opacified and patent. No evidence of pulmonary embolism. Enlargement of cardiac chambers. No pericardial effusion. Mediastinum/Nodes: Esophagus unremarkable. Base of cervical region normal appearance. No thoracic adenopathy. Lungs/Pleura: BILATERAL pleural effusions with atelectasis of the posterior lower lobes bilaterally. Remaining lungs clear though scattered respiratory motion artifacts are present. Upper Abdomen: Last image demonstrates ectasia of the abdominal aorta, noted on the 09/14/2017 exam, inadequately assessed on this study. Remaining visualized upper abdomen unremarkable. Musculoskeletal: Bones demineralized. Marked compression fracture of T12 vertebral body increased since previous exam. Review of the MIP images confirms the  above findings. IMPRESSION: No evidence of pulmonary embolism. Extensive  atherosclerotic calcifications of aorta, proximal great vessels and coronary arteries. Small BILATERAL pleural effusions and bibasilar atelectasis. Increased compression fracture of T12 vertebral body. Aortic Atherosclerosis (ICD10-I70.0). Electronically Signed   By: Lavonia Dana M.D.   On: 02/04/2018 13:16   Dg Chest Port 1 View  Result Date: 02/10/2018 CLINICAL DATA:  Right peripheral picc placement EXAM: PORTABLE CHEST 1 VIEW COMPARISON:  Chest x-ray dated 02/09/2018. FINDINGS: RIGHT-sided PICC line tip is still directed into the RIGHT lower neck, presumably within the internal jugular vein. Stable cardiomegaly. Overall cardiomediastinal silhouette is peers stable. Persistent opacity at the LEFT lung base, likely small pleural effusion and/or atelectasis. No new lung findings. No pneumothorax seen. IMPRESSION: 1. RIGHT-sided PICC line is again directed into the lower RIGHT neck with tip presumably within the internal jugular vein. 2. Persistent opacity at the LEFT lung base, compatible with the small LEFT pleural effusion and associated atelectasis demonstrated on earlier chest CT of 02/04/2018. Probable small residual pleural effusion at the RIGHT lung base. No new lung findings. No pneumothorax. These results will be called to the ordering clinician or representative by the Radiologist Assistant, and communication documented in the PACS or zVision Dashboard. Electronically Signed   By: Franki Cabot M.D.   On: 02/10/2018 11:30   Dg Chest Port 1 View  Result Date: 02/05/2018 CLINICAL DATA:  Hypoxia. EXAM: PORTABLE CHEST 1 VIEW COMPARISON:  Chest CT yesterday. FINDINGS: Right upper extremity PICC tip in the proximal SVC. Again seen cardiomegaly with tortuous atherosclerotic thoracic aorta. Bilateral pleural effusions, unchanged of the left but worsened on the right. Slight vascular congestion. No pulmonary edema or pneumothorax.  IMPRESSION: 1. Bilateral pleural effusions, increasing on the right and unchanged on the left. 2. Cardiomegaly with slight vascular congestion. Atherosclerotic tortuous thoracic aorta. Electronically Signed   By: Keith Rake M.D.   On: 02/05/2018 05:21   Dg Chest Port 1 View  Result Date: 02/03/2018 CLINICAL DATA:  Low O2 sats. EXAM: PORTABLE CHEST 1 VIEW COMPARISON:  February 02, 2018 FINDINGS: There is a left-sided pleural effusion with underlying opacity, similar in the interval. The probable tiny layering effusion on the right seen yesterday is not definitely seen today. A right PICC line is in good position. No pneumothorax. Stable cardiomegaly. No other change. IMPRESSION: 1. The right PICC line is in good position. 2. Left-sided pleural effusion with underlying opacity, not significantly changed. Electronically Signed   By: Dorise Bullion III M.D   On: 02/03/2018 19:31   Dg Chest Port 1 View  Result Date: 02/02/2018 CLINICAL DATA:  PICC line placement evaluation. EXAM: PORTABLE CHEST 1 VIEW COMPARISON:  Sep 04, 2017 FINDINGS: A right PICC line terminates in the central SVC. Stable cardiomegaly. Small bilateral effusions are new. No other interval changes or acute abnormalities. IMPRESSION: 1. The right PICC line terminates in the central SVC. 2. Small bilateral effusions with underlying atelectasis, new in the interval. Electronically Signed   By: Dorise Bullion III M.D   On: 02/02/2018 21:27   Dg Knee Complete 4 Views Left  Result Date: 02/03/2018 CLINICAL DATA:  Left knee pain today. EXAM: LEFT KNEE - COMPLETE 4+ VIEW COMPARISON:  Remote radiographs 12/16/2005 FINDINGS: No fracture or dislocation. Tricompartmental osteoarthritis with peripheral spurring, progressed from remote prior exam. Lateral tibiofemoral joint space narrowing. There is tibiofemoral chondrocalcinosis. Small joint effusion. Advanced vascular calcifications. IMPRESSION: Moderate to advanced tricompartmental osteoarthritis  without acute osseous abnormality. Electronically Signed   By: Keith Rake M.D.   On: 02/03/2018 23:27  Dg C-arm 1-60 Min-no Report  Result Date: 01/31/2018 Fluoroscopy was utilized by the requesting physician.  No radiographic interpretation.   Ct Renal Stone Study  Result Date: 01/30/2018 CLINICAL DATA:  Acute abdominal flank pain worse on the right EXAM: CT ABDOMEN AND PELVIS WITHOUT CONTRAST TECHNIQUE: Multidetector CT imaging of the abdomen and pelvis was performed following the standard protocol without IV contrast. COMPARISON:  09/04/2017 FINDINGS: Lower chest: Bibasilar atelectasis. Mild cardiomegaly. No pericardial or pleural effusion. Small hiatal hernia. Descending thoracic aorta is tortuous and ectatic with atherosclerosis. Hepatobiliary: No focal liver abnormality is seen. Status post cholecystectomy. No biliary dilatation. Pancreas: Unremarkable. No pancreatic ductal dilatation or surrounding inflammatory changes. Spleen: Normal in size without focal abnormality. Adrenals/Urinary Tract: Normal adrenal glands. Right kidney demonstrates acute perinephric strandy edema/inflammation. There is associated moderate right hydroureteronephrosis secondary to a proximal obstructing right ureteral calculus measuring 6 mm, image 34 series 2. There is a stable 2.5 cm right kidney lower pole hypodense cyst medially. Left kidney and ureter demonstrate no acute process or obstruction. Left ureter is nondilated. Air noted within the bladder dome.  Bladder otherwise unremarkable. Stomach/Bowel: Negative for bowel obstruction, significant dilatation, ileus, or free air. Appendix not visualized. No fluid collection or abscess. Moderate colonic stool burden. Extensive sigmoid diverticulosis without acute inflammatory process. Vascular/Lymphatic: Atherosclerosis of the aorta. Infrarenal aorta has aneurysm dilatation measuring 3.9 cm. Extensive iliac atherosclerosis as well. No retroperitoneal hemorrhage or  hematoma. No adenopathy. Reproductive: Remote hysterectomy. No other pelvic abnormality, fluid collection, ascites, or abscess. Other: No abdominal wall hernia or abnormality. No abdominopelvic ascites. Musculoskeletal: Bones are osteopenic. Degenerative changes of the spine. Extensive facet arthropathy. T12 and L5 subacute partially healing compression fractures noted when compared to 07/03/2017. IMPRESSION: Right proximal ureteral 6 mm obstructing calculus with associated right hydroureteronephrosis and perinephric/ureteral inflammation and edema. 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 Remote cholecystectomy, appendectomy and hysterectomy Colonic diverticulosis without acute inflammation or complicating feature Subacute T12 and L5 compression fractures, new since 07/03/2017 Electronically Signed   By: Jerilynn Mages.  Shick M.D.   On: 01/30/2018 22:00   [place] Ekg Site Rite  Result Date: 02/10/2018 If Site Rite image not attached, placement could not be confirmed due to current cardiac rhythm.  [place] Ekg Site Rite  Result Date: 02/02/2018 If Site Rite image not attached, placement could not be confirmed due to current cardiac rhythm.      Subjective: Patient seen and examined the bedside this morning.  Remains hemodynamically stable.  Mental status has improved.  Stable for discharge today.  Discharge Exam: Vitals:   02/12/18 0404 02/12/18 0503  BP:  (!) 142/98  Pulse:  84  Resp:  18  Temp: 98.2 F (36.8 C) 97.6 F (36.4 C)  SpO2:  97%   Vitals:   02/11/18 2253 02/12/18 0404 02/12/18 0503 02/12/18 0610  BP:   (!) 142/98   Pulse: (!) 107  84   Resp: 20  18   Temp:  98.2 F (36.8 C) 97.6 F (36.4 C)   TempSrc:  Oral Oral   SpO2: 95%  97%   Weight:    75.6 kg    General: Pt is alert, awake, not in acute distress Cardiovascular: RRR, S1/S2 +, no rubs, no gallops Respiratory: CTA bilaterally, no wheezing, no rhonchi Abdominal:  Soft, NT, ND, bowel sounds + Extremities: no edema, no cyanosis    The results of significant diagnostics from this hospitalization (including imaging, microbiology, ancillary and laboratory) are  listed below for reference.     Microbiology: Recent Results (from the past 240 hour(s))  MRSA PCR Screening     Status: None   Collection Time: 02/10/18  2:50 AM  Result Value Ref Range Status   MRSA by PCR NEGATIVE NEGATIVE Final    Comment:        The GeneXpert MRSA Assay (FDA approved for NASAL specimens only), is one component of a comprehensive MRSA colonization surveillance program. It is not intended to diagnose MRSA infection nor to guide or monitor treatment for MRSA infections. Performed at [facility], [place] [address], [place], [place] [address]      Labs: BNP (last 3 results) Recent Labs    02/03/18 1952 02/09/18 1651  BNP 357.6* 478.2*   Basic Metabolic Panel: Recent Labs  Lab 02/09/18 0521 02/09/18 1651 02/10/18 0051 02/11/18 0608 02/11/18 1541 02/12/18 0734  NA 143 143 140 143  --  140  K 3.2* 3.6 3.5 2.6* 4.7 4.4  CL 102 105 101 99  --  105  CO2 27 25 22 29   --  24  GLUCOSE 107* 111* 120* 92  --  95  BUN 20 18 16 22   --  21  CREATININE 0.76 0.75 0.72 0.84  --  0.71  CALCIUM 8.7* 8.9 8.6* 8.7*  --  8.1*  MG  --   --   --  1.8  --   --    Liver Function Tests: Recent Labs  Lab 02/09/18 1651 02/10/18 0051  AST 21 22  ALT 12 13  ALKPHOS 60 59  BILITOT 1.4* 1.4*  PROT 6.9 6.9  ALBUMIN 2.8* 2.7*   No results for input(s): LIPASE, AMYLASE in the last 168 hours. Recent Labs  Lab 02/10/18 0051  AMMONIA 15   CBC: Recent Labs  Lab 02/09/18 1651 02/10/18 0051 02/11/18 0608 02/12/18 0734  WBC 11.8* 15.9* 9.7 8.3  NEUTROABS 9.5* 13.3* 6.6 5.3  HGB 11.0* 11.5* 11.1* 10.4*  HCT 35.2* 36.5 35.4* 33.9*  MCV 92.6 90.1 90.3 92.4  PLT 268 304 305 278   Cardiac Enzymes: No results for input(s): CKTOTAL, CKMB, CKMBINDEX,  TROPONINI in the last 168 hours. BNP: Invalid input(s): POCBNP CBG: No results for input(s): GLUCAP in the last 168 hours. D-Dimer No results for input(s): DDIMER in the last 72 hours. Hgb A1c No results for input(s): HGBA1C in the last 72 hours. Lipid Profile No results for input(s): CHOL, HDL, LDLCALC, TRIG, CHOLHDL, LDLDIRECT in the last 72 hours. Thyroid function studies Recent Labs    02/10/18 0051  TSH 3.641   Anemia work up Recent Labs    02/10/18 0051  VITAMINB12 297   Urinalysis    Component Value Date/Time   COLORURINE YELLOW 02/09/2018 1651   APPEARANCEUR CLOUDY (A) 02/09/2018 1651   LABSPEC 1.016 02/09/2018 1651   PHURINE 6.0 02/09/2018 1651   GLUCOSEU 50 (A) 02/09/2018 1651   GLUCOSEU NEG mg/dL 03/08/2007 2031   HGBUR LARGE (A) 02/09/2018 1651   HGBUR negative 03/06/2008 0846   BILIRUBINUR NEGATIVE 02/09/2018 1651   KETONESUR 80 (A) 02/09/2018 1651   PROTEINUR 100 (A) 02/09/2018 1651   UROBILINOGEN 0.2 09/20/2013 1535   NITRITE NEGATIVE 02/09/2018 1651   LEUKOCYTESUR NEGATIVE 02/09/2018 1651   Sepsis Labs Invalid input(s): PROCALCITONIN,  WBC,  LACTICIDVEN Microbiology Recent Results (from the past 240 hour(s))  MRSA PCR Screening     Status: None   Collection Time: 02/10/18  2:50 AM  Result Value Ref  Range Status   MRSA by PCR NEGATIVE NEGATIVE Final    Comment:        The GeneXpert MRSA Assay (FDA approved for NASAL specimens only), is one component of a comprehensive MRSA colonization surveillance program. It is not intended to diagnose MRSA infection nor to guide or monitor treatment for MRSA infections. Performed at [facility], [place] [address] [place], [place] [address]     Please note: You were cared for by a hospitalist during your hospital stay. Once you are discharged, your primary care physician will handle any further medical issues. Please note that NO REFILLS for any discharge medications will be authorized  once you are discharged, as it is imperative that you return to your primary care physician (or establish a relationship with a primary care physician if you do not have one) for your post hospital discharge needs so that they can reassess your need for medications and monitor your lab values.    Time coordinating discharge: 40 minutes  SIGNED:   Shelly Coss, MD  [facility] 02/12/2018, 12:11 PM Pager [phone]  If 7PM-7AM, please contact night-coverage [email or web address] Password [email or web address]

## 2018-02-12 NOTE — Progress Notes (Signed)
Removed foley catheter per order, IV x 1 and telemetry monitor removed for DC, will DC to SNF with PIV to R hand, PTAR to transfer to SNF

## 2018-02-13 ENCOUNTER — Other Ambulatory Visit: Payer: Self-pay | Admitting: *Deleted

## 2018-02-13 DIAGNOSIS — I10 Essential (primary) hypertension: Secondary | ICD-10-CM

## 2018-02-13 NOTE — Consult Note (Signed)
[facility] Care Management follow up.  Chart reviewed. Noted patient discharged to [facility] on 02/12/18.   Will make referral to [facility] for follow while at [facility].   Marthenia Rolling, MSN-Ed, RN,BSN [facility] Psychiatric Liaison [phone]

## 2018-02-14 ENCOUNTER — Other Ambulatory Visit: Payer: Self-pay | Admitting: Urology

## 2018-02-16 ENCOUNTER — Other Ambulatory Visit: Payer: Self-pay | Admitting: Licensed Clinical Social Worker

## 2018-02-16 NOTE — Patient Outreach (Signed)
[facility]) Care Management  [facility] Social Work  02/16/2018  TEREASA YILMAZ 11/06/1933 [id number]  Subjective:    Objective:   Encounter Medications:  Outpatient Encounter Medications as of 02/16/2018  Medication Sig Note  . aspirin EC 81 MG tablet Take 81 mg by mouth daily. 02/09/2018: Pt was in hospital from 10/5-10/11  . DULoxetine (CYMBALTA) 30 MG capsule Take 90 mg by mouth every morning.  02/09/2018: Pt was in hospital from 10/5-10/11  . gabapentin (NEURONTIN) 100 MG capsule Take 2 capsules (200 mg total) by mouth at bedtime.   . metoprolol succinate (TOPROL-XL) 50 MG 24 hr tablet Take 1 tablet (50 mg total) by mouth daily. Take with or immediately following a meal.   . oxymorphone (OPANA) 5 MG tablet Take 1 tablet (5 mg total) by mouth 3 (three) times daily.   . QUEtiapine (SEROQUEL) 25 MG tablet Take 1 tablet (25 mg total) by mouth at bedtime.   . rosuvastatin (CRESTOR) 40 MG tablet TAKE 1 TABLET BY MOUTH EVERY DAY (Patient taking differently: Take 40 mg by mouth daily. ) 02/09/2018: Pt was in hospital from 10/5-10/11   No facility-administered encounter medications on file as of 02/16/2018.     Functional Status:  In your present state of health, do you have any difficulty performing the following activities: 02/10/2018 02/04/2018  Hearing? N N  Vision? N N  Difficulty concentrating or making decisions? N N  Walking or climbing stairs? Y Y  Dressing or bathing? Y Y  Doing errands, shopping? Tempie Donning  Some recent data might be hidden    Fall/Depression Screening:  PHQ 2/9 Scores 07/24/2017 04/17/2017 01/05/2017 12/22/2016 10/01/2015  PHQ - 2 Score 1 1 0 0 1  PHQ- 9 Score 3 3 - 4 5    Assessment:    CSW spoke via phone with [name], son of client. CSW verified identity of [name]. Audelia Acton and CSW spoke of client needs.  Audelia Acton said he had checked on rates for bed bugs treatment at client's home.  He said bed bug treatment was fairly expensive and he was  not sure if he could afford to pay for such treatment. He said he was currently unemployed.  CSW gave [facility] phone number and asked him to call CSW as needed to discuss social work needs of client.  CSW traveled to [place] in [place], [place] on 02/16/18.  CSW met with client on 02/16/18 at client's room at nursing center.CSW verified client identity. CSW received verbal permission from client for CSW to speak with client about client needs.  Client said she had been receiving nursing care and physical therapy support at nursing facility. She said she was not sure how long she would receive care at nursing home. CSW informed client that [facility] had talked with Audelia Acton via phone on 02/16/18 about pest control treatment at home of client . CSW informed Melicia that Audelia Acton was checking into estimates from pest control companies related to bed bug treatment at home of client. [place] understood this information. Client said she was eating adequately, sleeping adequately, and she did not mention any pain issues. CSW informed [place] [facility] program support in nursing , social work and pharmacy. CSW also talked with client about family support of client. CSW thanked Ilithyia for allowing CSW to visit her at nursing home on 02/16/18.  Plan:  CSW to call client or [name], son of client, in 3 weeks to assess client needs.  [facility]) Care Management  [facility] Social Work  02/16/2018  TEREASA YILMAZ 11/06/1933 [id number]  Subjective:    Objective:   Encounter Medications:  Outpatient Encounter Medications as of 02/16/2018  Medication Sig Note  . aspirin EC 81 MG tablet Take 81 mg by mouth daily. 02/09/2018: Pt was in hospital from 10/5-10/11  . DULoxetine (CYMBALTA) 30 MG capsule Take 90 mg by mouth every morning.  02/09/2018: Pt was in hospital from 10/5-10/11  . gabapentin (NEURONTIN) 100 MG capsule Take 2 capsules (200 mg total) by mouth at bedtime.   . metoprolol succinate (TOPROL-XL) 50 MG 24 hr tablet Take 1 tablet (50 mg total) by mouth daily. Take with or immediately following a meal.   . oxymorphone (OPANA) 5 MG tablet Take 1 tablet (5 mg total) by mouth 3 (three) times daily.   . QUEtiapine (SEROQUEL) 25 MG tablet Take 1 tablet (25 mg total) by mouth at bedtime.   . rosuvastatin (CRESTOR) 40 MG tablet TAKE 1 TABLET BY MOUTH EVERY DAY (Patient taking differently: Take 40 mg by mouth daily. ) 02/09/2018: Pt was in hospital from 10/5-10/11   No facility-administered encounter medications on file as of 02/16/2018.     Functional Status:  In your present state of health, do you have any difficulty performing the following activities: 02/10/2018 02/04/2018  Hearing? N N  Vision? N N  Difficulty concentrating or making decisions? N N  Walking or climbing stairs? Y Y  Dressing or bathing? Y Y  Doing errands, shopping? Tempie Donning  Some recent data might be hidden    Fall/Depression Screening:  PHQ 2/9 Scores 07/24/2017 04/17/2017 01/05/2017 12/22/2016 10/01/2015  PHQ - 2 Score 1 1 0 0 1  PHQ- 9 Score 3 3 - 4 5    Assessment:    CSW spoke via phone with [name], son of client. CSW verified identity of [name]. Audelia Acton and CSW spoke of client needs.  Audelia Acton said he had checked on rates for bed bugs treatment at client's home.  He said bed bug treatment was fairly expensive and he was  not sure if he could afford to pay for such treatment. He said he was currently unemployed.  CSW gave [facility] phone number and asked him to call CSW as needed to discuss social work needs of client.  CSW traveled to [place] in [place], [place] on 02/16/18.  CSW met with client on 02/16/18 at client's room at nursing center.CSW verified client identity. CSW received verbal permission from client for CSW to speak with client about client needs.  Client said she had been receiving nursing care and physical therapy support at nursing facility. She said she was not sure how long she would receive care at nursing home. CSW informed client that [facility] had talked with Audelia Acton via phone on 02/16/18 about pest control treatment at home of client . CSW informed Melicia that Audelia Acton was checking into estimates from pest control companies related to bed bug treatment at home of client. [place] understood this information. Client said she was eating adequately, sleeping adequately, and she did not mention any pain issues. CSW informed [place] of [facility] program support in nursing , social work and pharmacy. CSW also talked with client about family support of client. CSW thanked Ilithyia for allowing CSW to visit her at nursing home on 02/16/18.  Plan:  CSW to call client or [name], son of client, in 3 weeks to assess client needs.

## 2018-02-19 ENCOUNTER — Encounter (HOSPITAL_COMMUNITY): Payer: Self-pay | Admitting: *Deleted

## 2018-02-19 DIAGNOSIS — I480 Paroxysmal atrial fibrillation: Secondary | ICD-10-CM | POA: Diagnosis not present

## 2018-02-19 DIAGNOSIS — G9341 Metabolic encephalopathy: Secondary | ICD-10-CM | POA: Diagnosis not present

## 2018-02-19 DIAGNOSIS — Z1612 Extended spectrum beta lactamase (ESBL) resistance: Secondary | ICD-10-CM | POA: Diagnosis not present

## 2018-02-19 DIAGNOSIS — G8929 Other chronic pain: Secondary | ICD-10-CM | POA: Diagnosis not present

## 2018-02-19 NOTE — Progress Notes (Signed)
Preop instructions JFH:[id number] Halley                          Date of Birth : 1933-10-04                           Date of Procedure:03/01/2018        Doctor: Dr Louis Meckel  Time to arrive at [facility] Report to: Admitting  Procedure:right ureteroscopy laser lithotripsy, stent exchange  Any procedure time changes, MD office will notify you!   Do not eat or drink past midnight the night before your procedure.(To include any tube feedings-must be discontinued)    Take these morning medications only with sips of water.(or give through gastrostomy or feeding tube). Cymbalta, Toprol, Opana,     Facility contact:  [facility]                 Phone:  [phone]                Health Care POA:  Transportation contact phone#: [name]  Please send day of procedure:current med list and meds last taken that day, confirm nothing by mouth status from what time, Patient Demographic info( to include DNR status, problem list, allergies)   RN contact name/phone#: [facility]                             and Fax [phone]  [facility] card and picture ID Leave all jewelry and other valuables at place where living( no metal or rings to be worn) No contact lens Women-no make-up, no lotions,perfumes,powders   Any questions day of procedure,call  [place] [phone]  Sent from :[facility]                   [place]                   Fax:[phone]  Sent by : Gillian Shields RN

## 2018-02-23 ENCOUNTER — Encounter (HOSPITAL_COMMUNITY): Payer: Self-pay | Admitting: Emergency Medicine

## 2018-02-23 ENCOUNTER — Other Ambulatory Visit: Payer: Self-pay

## 2018-02-23 ENCOUNTER — Inpatient Hospital Stay (HOSPITAL_COMMUNITY)
Admission: EM | Admit: 2018-02-23 | Discharge: 2018-02-26 | DRG: 689 | Disposition: A | Discharge status: Skilled Nursing Facility | Payer: Medicare Other | Attending: Internal Medicine | Admitting: Internal Medicine

## 2018-02-23 ENCOUNTER — Emergency Department (HOSPITAL_COMMUNITY): Payer: Medicare Other

## 2018-02-23 DIAGNOSIS — S199XXA Unspecified injury of neck, initial encounter: Secondary | ICD-10-CM | POA: Diagnosis not present

## 2018-02-23 DIAGNOSIS — G9009 Other idiopathic peripheral autonomic neuropathy: Secondary | ICD-10-CM | POA: Diagnosis present

## 2018-02-23 DIAGNOSIS — G4733 Obstructive sleep apnea (adult) (pediatric): Secondary | ICD-10-CM | POA: Diagnosis present

## 2018-02-23 DIAGNOSIS — Z1612 Extended spectrum beta lactamase (ESBL) resistance: Secondary | ICD-10-CM | POA: Diagnosis present

## 2018-02-23 DIAGNOSIS — G9341 Metabolic encephalopathy: Secondary | ICD-10-CM | POA: Diagnosis present

## 2018-02-23 DIAGNOSIS — I10 Essential (primary) hypertension: Secondary | ICD-10-CM | POA: Diagnosis present

## 2018-02-23 DIAGNOSIS — M79606 Pain in leg, unspecified: Secondary | ICD-10-CM

## 2018-02-23 DIAGNOSIS — I714 Abdominal aortic aneurysm, without rupture, unspecified: Secondary | ICD-10-CM | POA: Diagnosis present

## 2018-02-23 DIAGNOSIS — Z882 Allergy status to sulfonamides status: Secondary | ICD-10-CM

## 2018-02-23 DIAGNOSIS — I482 Chronic atrial fibrillation, unspecified: Secondary | ICD-10-CM | POA: Diagnosis present

## 2018-02-23 DIAGNOSIS — Z823 Family history of stroke: Secondary | ICD-10-CM

## 2018-02-23 DIAGNOSIS — N136 Pyonephrosis: Principal | ICD-10-CM | POA: Diagnosis present

## 2018-02-23 DIAGNOSIS — F0390 Unspecified dementia without behavioral disturbance: Secondary | ICD-10-CM | POA: Diagnosis not present

## 2018-02-23 DIAGNOSIS — R0902 Hypoxemia: Secondary | ICD-10-CM | POA: Diagnosis present

## 2018-02-23 DIAGNOSIS — Z96653 Presence of artificial knee joint, bilateral: Secondary | ICD-10-CM | POA: Diagnosis present

## 2018-02-23 DIAGNOSIS — S0101XA Laceration without foreign body of scalp, initial encounter: Secondary | ICD-10-CM | POA: Diagnosis not present

## 2018-02-23 DIAGNOSIS — N390 Urinary tract infection, site not specified: Secondary | ICD-10-CM

## 2018-02-23 DIAGNOSIS — Z8249 Family history of ischemic heart disease and other diseases of the circulatory system: Secondary | ICD-10-CM

## 2018-02-23 DIAGNOSIS — J9811 Atelectasis: Secondary | ICD-10-CM | POA: Diagnosis not present

## 2018-02-23 DIAGNOSIS — M4690 Unspecified inflammatory spondylopathy, site unspecified: Secondary | ICD-10-CM | POA: Diagnosis present

## 2018-02-23 DIAGNOSIS — I700 Atherosclerosis of aorta: Secondary | ICD-10-CM | POA: Diagnosis present

## 2018-02-23 DIAGNOSIS — E785 Hyperlipidemia, unspecified: Secondary | ICD-10-CM | POA: Diagnosis present

## 2018-02-23 DIAGNOSIS — G894 Chronic pain syndrome: Secondary | ICD-10-CM | POA: Diagnosis present

## 2018-02-23 DIAGNOSIS — K5790 Diverticulosis of intestine, part unspecified, without perforation or abscess without bleeding: Secondary | ICD-10-CM | POA: Diagnosis present

## 2018-02-23 DIAGNOSIS — R4182 Altered mental status, unspecified: Secondary | ICD-10-CM

## 2018-02-23 DIAGNOSIS — S01112A Laceration without foreign body of left eyelid and periocular area, initial encounter: Secondary | ICD-10-CM | POA: Diagnosis not present

## 2018-02-23 DIAGNOSIS — N1330 Unspecified hydronephrosis: Secondary | ICD-10-CM | POA: Diagnosis not present

## 2018-02-23 DIAGNOSIS — Z9049 Acquired absence of other specified parts of digestive tract: Secondary | ICD-10-CM

## 2018-02-23 DIAGNOSIS — G9340 Encephalopathy, unspecified: Secondary | ICD-10-CM | POA: Diagnosis present

## 2018-02-23 DIAGNOSIS — M069 Rheumatoid arthritis, unspecified: Secondary | ICD-10-CM | POA: Diagnosis present

## 2018-02-23 DIAGNOSIS — Z79891 Long term (current) use of opiate analgesic: Secondary | ICD-10-CM

## 2018-02-23 DIAGNOSIS — A499 Bacterial infection, unspecified: Secondary | ICD-10-CM | POA: Diagnosis present

## 2018-02-23 DIAGNOSIS — E860 Dehydration: Secondary | ICD-10-CM | POA: Diagnosis present

## 2018-02-23 DIAGNOSIS — M1710 Unilateral primary osteoarthritis, unspecified knee: Secondary | ICD-10-CM | POA: Diagnosis present

## 2018-02-23 DIAGNOSIS — I119 Hypertensive heart disease without heart failure: Secondary | ICD-10-CM | POA: Diagnosis present

## 2018-02-23 DIAGNOSIS — M8580 Other specified disorders of bone density and structure, unspecified site: Secondary | ICD-10-CM | POA: Diagnosis present

## 2018-02-23 DIAGNOSIS — I959 Hypotension, unspecified: Secondary | ICD-10-CM | POA: Diagnosis present

## 2018-02-23 DIAGNOSIS — Z96641 Presence of right artificial hip joint: Secondary | ICD-10-CM | POA: Diagnosis present

## 2018-02-23 DIAGNOSIS — Z833 Family history of diabetes mellitus: Secondary | ICD-10-CM

## 2018-02-23 DIAGNOSIS — Z7982 Long term (current) use of aspirin: Secondary | ICD-10-CM

## 2018-02-23 DIAGNOSIS — E1151 Type 2 diabetes mellitus with diabetic peripheral angiopathy without gangrene: Secondary | ICD-10-CM | POA: Diagnosis present

## 2018-02-23 DIAGNOSIS — B9629 Other Escherichia coli [E. coli] as the cause of diseases classified elsewhere: Secondary | ICD-10-CM | POA: Diagnosis present

## 2018-02-23 DIAGNOSIS — Z66 Do not resuscitate: Secondary | ICD-10-CM | POA: Diagnosis present

## 2018-02-23 DIAGNOSIS — W19XXXA Unspecified fall, initial encounter: Secondary | ICD-10-CM | POA: Diagnosis present

## 2018-02-23 DIAGNOSIS — N10 Acute tubulo-interstitial nephritis: Secondary | ICD-10-CM | POA: Diagnosis present

## 2018-02-23 LAB — URINALYSIS, ROUTINE W REFLEX MICROSCOPIC
Bilirubin Urine: NEGATIVE
Glucose, UA: NEGATIVE mg/dL
Ketones, ur: NEGATIVE mg/dL
Nitrite: POSITIVE — AB
Protein, ur: 100 mg/dL — AB
RBC / HPF: >50 RBC/hpf — ABNORMAL HIGH (ref 0–5)
Specific Gravity, Urine: 1.018 (ref 1.005–1.030)
WBC, UA: >50 WBC/hpf — ABNORMAL HIGH (ref 0–5)
pH: 6 (ref 5.0–8.0)

## 2018-02-23 LAB — TROPONIN I: Troponin I: <0.03 ng/mL (ref <0.03)

## 2018-02-23 LAB — CBC WITH DIFFERENTIAL/PLATELET
Abs Immature Granulocytes: 0.03 10*3/uL (ref 0.00–0.07)
Basophils Absolute: 0.1 10*3/uL (ref 0.0–0.1)
Basophils Relative: 1 %
Eosinophils Absolute: 0.1 10*3/uL (ref 0.0–0.5)
Eosinophils Relative: 1 %
HCT: 36.2 % (ref 36.0–46.0)
Hemoglobin: 10.9 g/dL — ABNORMAL LOW (ref 12.0–15.0)
Immature Granulocytes: 0 %
Lymphocytes Relative: 20 %
Lymphs Abs: 2.1 10*3/uL (ref 0.7–4.0)
MCH: 27.9 pg (ref 26.0–34.0)
MCHC: 30.1 g/dL (ref 30.0–36.0)
MCV: 92.6 fL (ref 80.0–100.0)
Monocytes Absolute: 0.7 10*3/uL (ref 0.1–1.0)
Monocytes Relative: 6 %
Neutro Abs: 7.5 10*3/uL (ref 1.7–7.7)
Neutrophils Relative %: 72 %
Platelets: 251 10*3/uL (ref 150–400)
RBC: 3.91 MIL/uL (ref 3.87–5.11)
RDW: 13.7 % (ref 11.5–15.5)
WBC: 10.5 10*3/uL (ref 4.0–10.5)
nRBC: 0 % (ref 0.0–0.2)

## 2018-02-23 LAB — RAPID URINE DRUG SCREEN, HOSP PERFORMED
Amphetamines: NOT DETECTED
Barbiturates: NOT DETECTED
Benzodiazepines: NOT DETECTED
Cocaine: NOT DETECTED
Opiates: POSITIVE — AB
Tetrahydrocannabinol: NOT DETECTED

## 2018-02-23 LAB — BASIC METABOLIC PANEL
Anion gap: 7 (ref 5–15)
BUN: 22 mg/dL (ref 8–23)
CO2: 29 mmol/L (ref 22–32)
Calcium: 8.6 mg/dL — ABNORMAL LOW (ref 8.9–10.3)
Chloride: 101 mmol/L (ref 98–111)
Creatinine, Ser: 0.86 mg/dL (ref 0.44–1.00)
GFR calc Af Amer: >60 mL/min (ref >60)
GFR calc non Af Amer: >60 mL/min (ref >60)
Glucose, Bld: 119 mg/dL — ABNORMAL HIGH (ref 70–99)
Potassium: 4.3 mmol/L (ref 3.5–5.1)
Sodium: 137 mmol/L (ref 135–145)

## 2018-02-23 LAB — GLUCOSE, CAPILLARY: Glucose-Capillary: 83 mg/dL (ref 70–99)

## 2018-02-23 LAB — I-STAT CG4 LACTIC ACID, ED: Lactic Acid, Venous: 0.79 mmol/L (ref 0.5–1.9)

## 2018-02-23 LAB — MRSA PCR SCREENING: MRSA by PCR: NEGATIVE

## 2018-02-23 MED ORDER — OXYMORPHONE HCL 5 MG PO TABS: 5.0000 mg | ORAL_TABLET | Freq: Three times a day (TID) | ORAL | Status: DC

## 2018-02-23 MED ORDER — VITAMIN D 1000 UNITS PO TABS
2000.0000 [IU] | ORAL_TABLET | Freq: Every day | ORAL | Status: DC
  Administered 2018-02-24 – 2018-02-26 (×3): 2000 [IU] via ORAL
  Filled 2018-02-23 (×3): qty 2

## 2018-02-23 MED ORDER — SODIUM CHLORIDE 0.9 % IV SOLN
1.0000 g | Freq: Three times a day (TID) | INTRAVENOUS | Status: DC
  Administered 2018-02-23 – 2018-02-26 (×8): 1 g via INTRAVENOUS
  Filled 2018-02-23 (×14): qty 1

## 2018-02-23 MED ORDER — SODIUM CHLORIDE 0.9 % IV BOLUS
500.0000 mL | Freq: Once | INTRAVENOUS | Status: AC
  Administered 2018-02-23: 500 mL via INTRAVENOUS

## 2018-02-23 MED ORDER — PIPERACILLIN-TAZOBACTAM 3.375 G IVPB 30 MIN
3.3750 g | Freq: Once | INTRAVENOUS | Status: AC
  Administered 2018-02-23: 3.375 g via INTRAVENOUS
  Filled 2018-02-23: qty 50

## 2018-02-23 MED ORDER — GABAPENTIN 100 MG PO CAPS
200.0000 mg | ORAL_CAPSULE | Freq: Every day | ORAL | Status: DC
  Administered 2018-02-23 – 2018-02-25 (×3): 200 mg via ORAL
  Filled 2018-02-23 (×3): qty 2

## 2018-02-23 MED ORDER — SODIUM CHLORIDE 0.9 % IV SOLN
INTRAVENOUS | Status: DC
  Administered 2018-02-23: 21:00:00 via INTRAVENOUS

## 2018-02-23 MED ORDER — ASPIRIN EC 81 MG PO TBEC
81.0000 mg | DELAYED_RELEASE_TABLET | Freq: Every day | ORAL | Status: DC
  Administered 2018-02-24 – 2018-02-26 (×3): 81 mg via ORAL
  Filled 2018-02-23 (×3): qty 1

## 2018-02-23 MED ORDER — ROSUVASTATIN CALCIUM 20 MG PO TABS
40.0000 mg | ORAL_TABLET | Freq: Every evening | ORAL | Status: DC
  Administered 2018-02-23 – 2018-02-25 (×3): 40 mg via ORAL
  Filled 2018-02-23 (×3): qty 2

## 2018-02-23 MED ORDER — INSULIN ASPART 100 UNIT/ML ~~LOC~~ SOLN
0.0000 [IU] | Freq: Three times a day (TID) | SUBCUTANEOUS | Status: DC
  Administered 2018-02-25: 1 [IU] via SUBCUTANEOUS

## 2018-02-23 MED ORDER — METOPROLOL SUCCINATE ER 50 MG PO TB24: 50.0000 mg | ORAL_TABLET | Freq: Every day | ORAL | Status: DC

## 2018-02-23 MED ORDER — ENOXAPARIN SODIUM 40 MG/0.4ML ~~LOC~~ SOLN
40.0000 mg | SUBCUTANEOUS | Status: DC
  Administered 2018-02-23 – 2018-02-25 (×3): 40 mg via SUBCUTANEOUS
  Filled 2018-02-23 (×3): qty 0.4

## 2018-02-23 MED ORDER — ACETAMINOPHEN 650 MG RE SUPP: 650.0000 mg | Freq: Four times a day (QID) | RECTAL | Status: DC | PRN

## 2018-02-23 MED ORDER — ALBUTEROL SULFATE (2.5 MG/3ML) 0.083% IN NEBU
5.0000 mg | INHALATION_SOLUTION | Freq: Once | RESPIRATORY_TRACT | Status: AC
  Administered 2018-02-23: 5 mg via RESPIRATORY_TRACT
  Filled 2018-02-23: qty 6

## 2018-02-23 MED ORDER — DULOXETINE HCL 60 MG PO CPEP
90.0000 mg | ORAL_CAPSULE | Freq: Every day | ORAL | Status: DC
  Administered 2018-02-24 – 2018-02-26 (×3): 90 mg via ORAL
  Filled 2018-02-23 (×3): qty 1

## 2018-02-23 MED ORDER — MORPHINE SULFATE 15 MG PO TABS
15.0000 mg | ORAL_TABLET | Freq: Three times a day (TID) | ORAL | Status: DC
  Administered 2018-02-23 – 2018-02-26 (×9): 15 mg via ORAL
  Filled 2018-02-23 (×9): qty 1

## 2018-02-23 MED ORDER — INSULIN ASPART 100 UNIT/ML ~~LOC~~ SOLN: 0.0000 [IU] | Freq: Every day | SUBCUTANEOUS | Status: DC

## 2018-02-23 MED ORDER — QUETIAPINE FUMARATE 25 MG PO TABS
25.0000 mg | ORAL_TABLET | Freq: Every day | ORAL | Status: DC
  Administered 2018-02-23 – 2018-02-25 (×3): 25 mg via ORAL
  Filled 2018-02-23 (×3): qty 1

## 2018-02-23 MED ORDER — ACETAMINOPHEN 325 MG PO TABS: 650.0000 mg | ORAL_TABLET | Freq: Four times a day (QID) | ORAL | Status: DC | PRN

## 2018-02-23 NOTE — ED Triage Notes (Signed)
resident of [name]  [address] now with lac to L eye area  Denies LOC by staff

## 2018-02-23 NOTE — Progress Notes (Signed)
Pharmacy Antibiotic Note  [name] is a 84 y.o. female admitted on 02/23/2018 with UTI/history of ESBL.  Pharmacy has been consulted for meropenem dosing.  Plan: Meropenem 1000 mg IV every 8 hours.  Monitor labs, c/s, and patient improvement.  Height: 5\' 4"  (162.6 cm) Weight: 170 lb (77.1 kg) IBW/kg (Calculated) : 54.7  Temp (24hrs), Avg:97.9 F (36.6 C), Min:97.9 F (36.6 C), Max:97.9 F (36.6 C)  Recent Labs  Lab 02/23/18 1536 02/23/18 1748  WBC 10.5  --   CREATININE 0.86  --   LATICACIDVEN  --  0.79    Estimated Creatinine Clearance: 50.7 mL/min (by C-G formula based on SCr of 0.86 mg/dL).    Allergies  Allergen Reactions  . Sulfonamide Derivatives Hives and Rash    Antimicrobials this admission: Meropenem 10/25 >>      Dose adjustments this admission: N/A  Microbiology results:   10/25 UCx: pending       Thank you for allowing pharmacy to be a part of this patient's care.  Ramond Craver 02/23/2018 8:33 PM

## 2018-02-23 NOTE — ED Notes (Signed)
Dr Darreld Mclean in to assess

## 2018-02-23 NOTE — H&P (Signed)
TRH H&P   Patient Demographics:    [name], is a 84 y.o. female  MRN: [id number]   DOB - 1933/12/13  Admit Date - 02/23/2018  Outpatient Primary MD for the patient is Dennard Schaumann [name], MD  Referring MD/NP/PA: Julianne Rice  Outpatient Specialists:    Patient coming from: home  Chief Complaint  Patient presents with  . Fall  . Laceration      HPI:    [name]  is a 84 y.o. female, w Dementia, hypertension, hyperlipidemia, [name], Pafib, rheumatoid arthritis, Osa on CPAP, apparently presents with fall and confusion and noted to be hypotensive sbp 79.     In Ed,  T 97.9, P 88, R 24, Bp 99/66, as low as 79/61,  Pox 90-93%  CT brain/ C spine IMPRESSION: 1.  No evidence for acute intracranial abnormality. 2. Atrophy and small vessel disease. 3. LEFT frontal scalp laceration and edema. 4. Degenerative changes in the cervical spine without evidence for acute cervical spine fracture. 5. Significant atherosclerotic calcification of the carotid arteries. 6. LEFT pleural effusion. Recommend further evaluation with two-view chest x-ray. 7.  Aortic atherosclerosis.  (ICD10-I70.0)   CXR IMPRESSION: Persistent basilar chest densities, left side greater than right. Findings are suggestive for small effusions with atelectasis/consolidation. Minimal change from the previous examination.  Stable cardiomegaly.  Aortic atherosclerosis.    Urinalysis wbc >50 Lactic acid 0.79  Na 137, K 4.3,   Bun 22, Creatinine 0.86 Wbc 10.5, Hgb 10.9, Plt 251  Pt will be admitted for hypotension, UTI (h/o ESBL),         Review of systems:    In addition to the HPI above, No Fever-chills, No Headache, No changes with Vision or hearing, No problems swallowing food or Liquids, No Chest pain, Cough or Shortness of Breath, No Abdominal pain, No Nausea or Vommitting,  Bowel movements are regular, No Blood in stool or Urine, No dysuria,  No new joints pains-aches,  No new weakness, tingling, numbness in any extremity, No recent weight gain or loss, No polyuria, polydypsia or polyphagia, No significant Mental Stressors.  A full 10 point Review of Systems was done, except as stated above, all other Review of Systems were negative.   With Past History of the following :    Past Medical History:  Diagnosis Date  . [name] (abdominal aortic aneurysm) ([place]) 09/2013   3.6 cm  . Acute and chronic respiratory failure with hypoxia ([place])   . Acute delirium   . Acute encephalopathy   . Acute pyelonephritis   . Atrial fibrillation ([place])   . Chronic pain syndrome   . Constipation   . Dementia ([place])   . Dependent edema    bilateral legs   . Depression   . Diabetes mellitus    pre-diabetes  . Diverticulosis   . Diverticulosis   . Dysuria   . E  coli bacteremia   . Fall   . Gross hematuria   . Hydronephrosis with renal and ureteral calculous obstruction   . Hyperlipidemia   . Hypertension   . Hypoxemia   . Idiopathic peripheral autonomic neuropathy   . Infestation by bed bug   . Nephrolithiasis   . OSA on CPAP   . Osteoarthritis   . Osteopenia    T=  -2.1 in hip  . Persistent mood (affective) disorder, unspecified ([place])   . Prediabetes   . Rheumatoid arthritis ([place])   . Rheumatoid arthritis(714.0)   . S/P carpal tunnel release   . Sepsis secondary to UTI ([place])   . Spinal stenosis   . Unspecified fracture of fifth lumbar vertebra, initial encounter for closed fracture ([place])   . Unspecified fracture of t11-T12 vertebra, initial encounter for closed fracture ([place])   . Urinary incontinence   . Urinary tract infection    hx of   . Urolithiasis       Past Surgical History:  Procedure Laterality Date  . APPENDECTOMY    . CARPAL TUNNEL RELEASE  12/27/2006   Right subcutaneous ulnar nerve transfer -- Right open carpal tunnel release  .  CHOLECYSTECTOMY    . CYSTOSCOPY W/ URETERAL STENT PLACEMENT Right 01/30/2018   Procedure: CYSTOSCOPY WITH STENT REPLACEMENT retrograde pylegram;  Surgeon: Ardis Hughs, MD;  Location: [facility];  Service: Urology;  Laterality: Right;  . FOOT SURGERY    . JOINT REPLACEMENT     BTKR, RTHR  . TOTAL KNEE ARTHROPLASTY  08/21/2007   Right total knee replacement      Social History:     Social History   Tobacco Use  . Smoking status: Never Smoker  . Smokeless tobacco: Never Used  Substance Use Topics  . Alcohol use: No     Lives - at [facility] - walks by self   Family History :     Family History  Problem Relation Age of Onset  . Stroke Father   . Coronary artery disease Son        CABG at age 32  . Heart disease Son   . Heart attack Son   . Stroke Mother   . Hypertension Sister   . Diabetes Brother   . Hypertension Maternal Aunt       Home Medications:   Prior to Admission medications   Medication Sig Start Date End Date Taking? Authorizing Provider  aspirin EC 81 MG tablet Take 81 mg by mouth daily.    [provider]  Cholecalciferol (VITAMIN D3) 2000 units TABS Take 2,000 Units by mouth daily.    [provider]  DULoxetine (CYMBALTA) 30 MG capsule Take 90 mg by mouth daily.  07/29/14   [provider]  gabapentin (NEURONTIN) 100 MG capsule Take 2 capsules (200 mg total) by mouth at bedtime. 02/12/18   Shelly Coss, MD  metoprolol succinate (TOPROL-XL) 50 MG 24 hr tablet Take 1 tablet (50 mg total) by mouth daily. Take with or immediately following a meal. 02/12/18   Shelly Coss, MD  nystatin cream (MYCOSTATIN) Apply 1 application topically 2 (two) times daily. Apply under breast until rash resolves    [provider]  oxymorphone (OPANA) 5 MG tablet Take 1 tablet (5 mg total) by mouth 3 (three) times daily. 02/12/18   Shelly Coss, MD  QUEtiapine (SEROQUEL) 25 MG tablet Take 1 tablet (25 mg total) by  mouth at bedtime. 02/12/18   Shelly Coss,  MD  rosuvastatin (CRESTOR) 40 MG tablet TAKE 1 TABLET BY MOUTH EVERY DAY Patient taking differently: Take 40 mg by mouth every evening.  12/25/17   [name], MD     Allergies:     Allergies  Allergen Reactions  . Sulfonamide Derivatives Hives and Rash     Physical Exam:   Vitals  Blood pressure 103/72, pulse 88, temperature 97.9 F (36.6 C), temperature source Oral, resp. rate 16, height 5\' 4"  (1.626 m), weight 77.1 kg, SpO2 93 %.   1. General  lying in bed in NAD,   2. Normal affect and insight, Not Suicidal or Homicidal, Awake Alert, Oriented X 1.  3. No F.N deficits, ALL C.Nerves Intact, Strength 5/5 all 4 extremities, Sensation intact all 4 extremities, Plantars down going.  4. Ears and Eyes appear Normal, Conjunctivae clear, PERRLA. Moist Oral Mucosa.  5. Supple Neck, No JVD, No cervical lymphadenopathy appriciated, No Carotid Bruits.  6. Symmetrical Chest wall movement, Good air movement bilaterally, CTAB.  7.  Irr, Irr, s1, s2,   8. Positive Bowel Sounds, Abdomen Soft, No tenderness, No organomegaly appriciated,No rebound -guarding or rigidity.  9.  No Cyanosis, Normal Skin Turgor, No Skin Rash or Bruise.  10. Good muscle tone,  joints appear normal , no effusions, Normal ROM.  11. No Palpable Lymph Nodes in Neck or Axillae     Data Review:    CBC Recent Labs  Lab 02/23/18 1536  WBC 10.5  HGB 10.9*  HCT 36.2  PLT 251  MCV 92.6  MCH 27.9  MCHC 30.1  RDW 13.7  LYMPHSABS 2.1  MONOABS 0.7  EOSABS 0.1  BASOSABS 0.1   ------------------------------------------------------------------------------------------------------------------  Chemistries  Recent Labs  Lab 02/23/18 1536  NA 137  K 4.3  CL 101  CO2 29  GLUCOSE 119*  BUN 22  CREATININE 0.86  CALCIUM 8.6*   ------------------------------------------------------------------------------------------------------------------ estimated  creatinine clearance is 50.7 mL/min (by C-G formula based on SCr of 0.86 mg/dL). ------------------------------------------------------------------------------------------------------------------ No results for input(s): TSH, T4TOTAL, T3FREE, THYROIDAB in the last 72 hours.  Invalid input(s): FREET3  Coagulation profile No results for input(s): INR, PROTIME in the last 168 hours. ------------------------------------------------------------------------------------------------------------------- No results for input(s): DDIMER in the last 72 hours. -------------------------------------------------------------------------------------------------------------------  Cardiac Enzymes No results for input(s): CKMB, TROPONINI, MYOGLOBIN in the last 168 hours.  Invalid input(s): CK ------------------------------------------------------------------------------------------------------------------    Component Value Date/Time   BNP 720.4 (H) 02/09/2018 1651     ---------------------------------------------------------------------------------------------------------------  Urinalysis    Component Value Date/Time   COLORURINE AMBER (A) 02/23/2018 1846   APPEARANCEUR CLOUDY (A) 02/23/2018 1846   LABSPEC 1.018 02/23/2018 1846   PHURINE 6.0 02/23/2018 1846   GLUCOSEU NEGATIVE 02/23/2018 1846   GLUCOSEU NEG mg/dL 03/08/2007 2031   HGBUR MODERATE (A) 02/23/2018 1846   HGBUR negative 03/06/2008 0846   BILIRUBINUR NEGATIVE 02/23/2018 1846   KETONESUR NEGATIVE 02/23/2018 1846   PROTEINUR 100 (A) 02/23/2018 1846   UROBILINOGEN 0.2 09/20/2013 1535   NITRITE POSITIVE (A) 02/23/2018 1846   LEUKOCYTESUR LARGE (A) 02/23/2018 1846    ----------------------------------------------------------------------------------------------------------------   Imaging Results:    Dg Chest 2 View  Result Date: 02/23/2018 CLINICAL DATA:  84 year old with hypoxia. EXAM: CHEST - 2 VIEW COMPARISON:  02/10/2018  FINDINGS: Stable enlargement of the cardiac silhouette. Atherosclerotic calcifications at the aortic arch. Densities at both lung bases are compatible with small pleural effusions and atelectasis/consolidation. Negative for pulmonary edema. Right arm PICC line has been removed. IMPRESSION: Persistent basilar chest densities, left side  greater than right. Findings are suggestive for small effusions with atelectasis/consolidation. Minimal change from the previous examination. Stable cardiomegaly. Aortic atherosclerosis. Electronically Signed   By: Markus Daft M.D.   On: 02/23/2018 17:43   Ct Head Wo Contrast  Result Date: 02/23/2018 CLINICAL DATA:  Pt states she fell onto her forehead. EXAM: CT HEAD WITHOUT CONTRAST CT CERVICAL SPINE WITHOUT CONTRAST TECHNIQUE: Multidetector CT imaging of the head and cervical spine was performed following the standard protocol without intravenous contrast. Multiplanar CT image reconstructions of the cervical spine were also generated. COMPARISON:  None. FINDINGS: CT HEAD FINDINGS Brain: There is significant central cortical atrophy. Periventricular white matter changes are present. There is no intra or extra-axial fluid collection or mass lesion. The basilar cisterns and ventricles have a normal appearance. There is no CT evidence for acute infarction or hemorrhage. Vascular: There is dense atherosclerotic calcification of the internal carotid arteries. No hyperdense vessels. Skull: Normal. Negative for fracture or focal lesion. Sinuses/Orbits: No acute finding. Other: Partially imaged LEFT frontal scalp edema and laceration. No underlying fracture. CT CERVICAL SPINE FINDINGS Alignment: Vertebral bodies are normally aligned with preservation of the normal cervical lordosis.There is degenerative type anterolisthesis of C4 on C5 measuring 2 millimeters. Skull base and vertebrae: No acute fracture. No primary bone lesion or focal pathologic process. Soft tissues and spinal canal: No  prevertebral fluid or swelling. No visible canal hematoma. There is extensive atherosclerotic calcification of the carotid arteries. Disc levels:  Mild disc height loss throughout the cervical spine. Upper chest: There is a LEFT pleural effusion. A partially imaged thoracic aorta shows significant atherosclerotic calcification. Other: None IMPRESSION: 1.  No evidence for acute intracranial abnormality. 2. Atrophy and small vessel disease. 3. LEFT frontal scalp laceration and edema. 4. Degenerative changes in the cervical spine without evidence for acute cervical spine fracture. 5. Significant atherosclerotic calcification of the carotid arteries. 6. LEFT pleural effusion. Recommend further evaluation with two-view chest x-ray. 7.  Aortic atherosclerosis.  (ICD10-I70.0) Electronically Signed   By: Nolon Nations M.D.   On: 02/23/2018 16:30   Ct Cervical Spine Wo Contrast  Result Date: 02/23/2018 CLINICAL DATA:  Pt states she fell onto her forehead. EXAM: CT HEAD WITHOUT CONTRAST CT CERVICAL SPINE WITHOUT CONTRAST TECHNIQUE: Multidetector CT imaging of the head and cervical spine was performed following the standard protocol without intravenous contrast. Multiplanar CT image reconstructions of the cervical spine were also generated. COMPARISON:  None. FINDINGS: CT HEAD FINDINGS Brain: There is significant central cortical atrophy. Periventricular white matter changes are present. There is no intra or extra-axial fluid collection or mass lesion. The basilar cisterns and ventricles have a normal appearance. There is no CT evidence for acute infarction or hemorrhage. Vascular: There is dense atherosclerotic calcification of the internal carotid arteries. No hyperdense vessels. Skull: Normal. Negative for fracture or focal lesion. Sinuses/Orbits: No acute finding. Other: Partially imaged LEFT frontal scalp edema and laceration. No underlying fracture. CT CERVICAL SPINE FINDINGS Alignment: Vertebral bodies are  normally aligned with preservation of the normal cervical lordosis.There is degenerative type anterolisthesis of C4 on C5 measuring 2 millimeters. Skull base and vertebrae: No acute fracture. No primary bone lesion or focal pathologic process. Soft tissues and spinal canal: No prevertebral fluid or swelling. No visible canal hematoma. There is extensive atherosclerotic calcification of the carotid arteries. Disc levels:  Mild disc height loss throughout the cervical spine. Upper chest: There is a LEFT pleural effusion. A partially imaged thoracic aorta shows significant atherosclerotic calcification. Other:  None IMPRESSION: 1.  No evidence for acute intracranial abnormality. 2. Atrophy and small vessel disease. 3. LEFT frontal scalp laceration and edema. 4. Degenerative changes in the cervical spine without evidence for acute cervical spine fracture. 5. Significant atherosclerotic calcification of the carotid arteries. 6. LEFT pleural effusion. Recommend further evaluation with two-view chest x-ray. 7.  Aortic atherosclerosis.  (ICD10-I70.0) Electronically Signed   By: Nolon Nations M.D.   On: 02/23/2018 16:30   afib at 100   Assessment & Plan:    Principal Problem:   Hypotension Active Problems:   Hyperlipidemia   Essential hypertension   Hypotension Tele Trop I q6h x3 Ns iv at 50mL / hr Consider check cortisol, cardiac echo if bp not improving  Complicated UTI (current right ureteral stent) H/o ESBL Urine culture Meropenem iv pharmacy to dose  AMS secondary to UTI And probably underlying dementia Monitor Cont Seroquel 25mg  po qhs Cont Duloxetine 90mg  po qday  Pafib Cont aspirin, (no anticoagulation due to fall risk? Cont Toprol XL  Hyperlipidemia Cont Crestor 40mg  po qhs  Pre-diabetes FSBS ac and qhs, ISS  Chronic neuropathy Cont Gabapentin Cont Opana (hold if sbp <100)    DVT Prophylaxis  Lovenox - SCDs  AM Labs Ordered, also please review Full Orders  Family  Communication: Admission, patients condition and plan of care including tests being ordered have been discussed with the patient  who indicate understanding and agree with the plan and Code Status.  Code Status  FULL CODE  Likely DC to  home  Condition GUARDED    Consults called: none  Admission status: observation, pt will be admitted for iv abx, for UTI associated with hypotension.  Pt has hx of ESBL requiring iv abx.  Depending upon her clinical status might need inpatient status   Time spent in minutes : 77   Jani Gravel M.D on 02/23/2018 at 8:21 PM  Between 7am to 7pm - Pager - [phone]   After 7pm go to [email or web address] - password [email or web address]  [facility] - Office  [phone]

## 2018-02-23 NOTE — ED Notes (Signed)
To Rad 

## 2018-02-23 NOTE — ED Notes (Signed)
Per daughter, pt has been at [facility] for 21 days  She has to have assistance when walking  Today got up and fell onto the floor   Denies LOC  3/4 in lac to L eyebrow area with steri strip application prior to arrival

## 2018-02-23 NOTE — ED Notes (Signed)
To CT

## 2018-02-23 NOTE — ED Notes (Signed)
Wound cleansed   steristrips by Dr Darreld Mclean  Wound dressing   Peer wick

## 2018-02-23 NOTE — ED Notes (Signed)
Report to Jenny, RN

## 2018-02-23 NOTE — ED Notes (Signed)
Pt noted to have lowering BP Family concerns expressed   Spoke w Dr Darreld Mclean who will go in and reassess

## 2018-02-23 NOTE — ED Provider Notes (Signed)
[facility] EMERGENCY DEPARTMENT Provider Note   CSN: [id number] Arrival date & time: 02/23/18  1420     History   Chief Complaint Chief Complaint  Patient presents with  . Fall  . Laceration    HPI [name] is a 84 y.o. female.  HPI Patient with history of dementia.  Unable to contribute to history.  Level 5 caveat.  Patient is a resident at [facility].  Had an unwitnessed fall.  Apparently struck her head and sustained laceration to the left eyebrow.  Daughter states patient is at her baseline mental status.  Unknown last tetanus shot. Past Medical History:  Diagnosis Date  . [name] (abdominal aortic aneurysm) ([place]) 09/2013   3.6 cm  . Acute and chronic respiratory failure with hypoxia ([place])   . Acute delirium   . Acute encephalopathy   . Acute pyelonephritis   . Atrial fibrillation ([place])   . Chronic pain syndrome   . Constipation   . Dementia ([place])   . Dependent edema    bilateral legs   . Depression   . Diabetes mellitus    pre-diabetes  . Diverticulosis   . Diverticulosis   . Dysuria   . E coli bacteremia   . Fall   . Gross hematuria   . Hydronephrosis with renal and ureteral calculous obstruction   . Hyperlipidemia   . Hypertension   . Hypoxemia   . Idiopathic peripheral autonomic neuropathy   . Infestation by bed bug   . Nephrolithiasis   . OSA on CPAP   . Osteoarthritis   . Osteopenia    T=  -2.1 in hip  . Persistent mood (affective) disorder, unspecified ([place])   . Prediabetes   . Rheumatoid arthritis ([place])   . Rheumatoid arthritis(714.0)   . S/P carpal tunnel release   . Sepsis secondary to UTI ([place])   . Spinal stenosis   . Unspecified fracture of fifth lumbar vertebra, initial encounter for closed fracture ([place])   . Unspecified fracture of t11-T12 vertebra, initial encounter for closed fracture ([place])   . Urinary incontinence   . Urinary tract infection    hx of   . Urolithiasis     Patient Active Problem List   Diagnosis Date Noted  . Hypotension 02/23/2018  . Complication associated with peripherally inserted central catheter (PICC) 02/10/2018  . Acute delirium 02/09/2018  . Acute encephalopathy 02/09/2018  . Acute hypoxemic respiratory failure ([place]) 02/05/2018  . Acute on chronic respiratory failure with hypoxia ([place]) 02/03/2018  . Infestation by bed bug 02/03/2018  . Urinary tract infection due to extended-spectrum beta lactamase (ESBL) producing Escherichia coli 02/02/2018  . Chronic a-fib 02/02/2018  . Acute pyelonephritis 01/31/2018  . Constipation 01/31/2018  . Urolithiasis 01/30/2018  . Hydronephrosis with renal and ureteral calculus obstruction 01/30/2018  . Fall   . T12 compression fracture ([place]) 09/04/2017  . E coli bacteremia 07/03/2017  . Fall at home, initial encounter 07/03/2017  . Tachycardia 02/03/2017  . Sepsis secondary to UTI ([place]) 02/02/2017  . Chronic pain syndrome 02/02/2017  . Abnormal EKG 11/21/2013  . [name] (abdominal aortic aneurysm) ([place]) 09/30/2013  . OSA (obstructive sleep apnea) 06/29/2012  . Dementia ([place]) 06/29/2012  . Pre-diabetes 06/29/2012  . Hypoxia, sleep related 06/29/2012  . RA (rheumatoid arthritis) ([place]) 06/29/2012  . Nonspecific abnormal results of cardiovascular function study 06/02/2011  . Nonspecific abnormal electrocardiogram (ECG) (EKG) 05/18/2011  . OSTEOPOROSIS 04/09/2008  . FLANK PAIN, RIGHT 03/04/2008  . Hyperlipidemia 05/12/2006  .  PYELONEPHRITIS, ACUTE 05/12/2006  . DEPENDENT EDEMA, LEGS, BILATERAL 05/12/2006  . URINARY INCONTINENCE 05/12/2006  . PERIPHERAL NEUROPATHY 03/11/2006  . Essential hypertension 03/11/2006  . DYSURIA 03/11/2006    Past Surgical History:  Procedure Laterality Date  . APPENDECTOMY    . CARPAL TUNNEL RELEASE  12/27/2006   Right subcutaneous ulnar nerve transfer -- Right open carpal tunnel release  . CHOLECYSTECTOMY    . CYSTOSCOPY W/ URETERAL STENT PLACEMENT Right 01/30/2018   Procedure: CYSTOSCOPY WITH  STENT REPLACEMENT retrograde pylegram;  Surgeon: Ardis Hughs, MD;  Location: [facility];  Service: Urology;  Laterality: Right;  . FOOT SURGERY    . JOINT REPLACEMENT     BTKR, RTHR  . TOTAL KNEE ARTHROPLASTY  08/21/2007   Right total knee replacement     OB History   None      Home Medications    Prior to Admission medications   Medication Sig Start Date End Date Taking? Authorizing Provider  aspirin EC 81 MG tablet Take 81 mg by mouth daily.   Yes [provider]  Cholecalciferol (VITAMIN D3) 2000 units TABS Take 2,000 Units by mouth daily.   Yes [provider]  DULoxetine (CYMBALTA) 30 MG capsule Take 90 mg by mouth daily.  07/29/14  Yes [provider]  gabapentin (NEURONTIN) 100 MG capsule Take 2 capsules (200 mg total) by mouth at bedtime. 02/12/18  Yes Shelly Coss, MD  metoprolol succinate (TOPROL-XL) 50 MG 24 hr tablet Take 1 tablet (50 mg total) by mouth daily. Take with or immediately following a meal. 02/12/18  Yes Adhikari, Amrit, MD  nystatin cream (MYCOSTATIN) Apply 1 application topically 2 (two) times daily. Apply under breast until rash resolves   Yes [provider]  oxymorphone (OPANA) 5 MG tablet Take 1 tablet (5 mg total) by mouth 3 (three) times daily. 02/12/18  Yes Shelly Coss, MD  QUEtiapine (SEROQUEL) 25 MG tablet Take 1 tablet (25 mg total) by mouth at bedtime. 02/12/18  Yes Shelly Coss, MD  rosuvastatin (CRESTOR) 40 MG tablet TAKE 1 TABLET BY MOUTH EVERY DAY Patient taking differently: Take 40 mg by mouth every evening.  12/25/17  Yes Pickard, [name], MD    Family History Family History  Problem Relation Age of Onset  . Stroke Father   . Coronary artery disease Son        CABG at age 56  . Heart disease Son   . Heart attack Son   . Stroke Mother   . Hypertension Sister   . Diabetes Brother   . Hypertension Maternal Aunt     Social History Social History   Tobacco Use  . Smoking status:  Never Smoker  . Smokeless tobacco: Never Used  Substance Use Topics  . Alcohol use: No  . Drug use: No     Allergies   Sulfonamide derivatives   Review of Systems Review of Systems  Unable to perform ROS: Dementia     Physical Exam Updated Vital Signs BP (!) 106/58   Pulse 95   Temp 98.5 F (36.9 C)   Resp 20   Ht 5\' 4"  (1.626 m)   Wt 73.3 kg   SpO2 94%   BMI 27.74 kg/m   Physical Exam  Constitutional: She is oriented to person, place, and time. She appears well-developed and well-nourished. No distress.  HENT:  Head: Normocephalic.  Mouth/Throat: Oropharynx is clear and moist.  Stellate laceration to the left eyebrow.  No active bleeding.  Mild  left periorbital contusion.  Midface is stable.  No intraoral lesions.   Eyes: Pupils are equal, round, and reactive to light. EOM are normal.  Pupils are 3 mm bilaterally and minimally reactive.  Neck: Normal range of motion. Neck supple.  No posterior midline cervical tenderness to palpation.  Cardiovascular: Normal rate and regular rhythm.  Pulmonary/Chest: Effort normal and breath sounds normal.  Abdominal: Soft. Bowel sounds are normal. There is no tenderness. There is no rebound and no guarding.  Musculoskeletal: Normal range of motion. She exhibits no edema or tenderness.  Neurological: She is oriented to person, place, and time.  Drowsy but easily aroused.  Confused but following simple commands.  5/5 motor all extremities.  Sensation intact.  Skin: Skin is warm and dry. Capillary refill takes less than 2 seconds. No rash noted. She is not diaphoretic. No erythema.  Psychiatric: She has a normal mood and affect. Her behavior is normal.  Nursing note and vitals reviewed.    ED Treatments / Results  Labs (all labs ordered are listed, but only abnormal results are displayed) Labs Reviewed  URINE CULTURE - Abnormal; Notable for the following components:      Result Value   Culture   (*)    Value: >=100,000  COLONIES/mL ESCHERICHIA COLI CULTURE REINCUBATED FOR BETTER GROWTH Performed at [facility], [address], [place], [place] [address]    All other components within normal limits  CBC WITH DIFFERENTIAL/PLATELET - Abnormal; Notable for the following components:   Hemoglobin 10.9 (*)    All other components within normal limits  BASIC METABOLIC PANEL - Abnormal; Notable for the following components:   Glucose, Bld 119 (*)    Calcium 8.6 (*)    All other components within normal limits  URINALYSIS, ROUTINE W REFLEX MICROSCOPIC - Abnormal; Notable for the following components:   Color, Urine AMBER (*)    APPearance CLOUDY (*)    Hgb urine dipstick MODERATE (*)    Protein, ur 100 (*)    Nitrite POSITIVE (*)    Leukocytes, UA LARGE (*)    RBC / HPF >50 (*)    WBC, UA >50 (*)    Bacteria, UA FEW (*)    All other components within normal limits  RAPID URINE DRUG SCREEN, HOSP PERFORMED - Abnormal; Notable for the following components:   Opiates POSITIVE (*)    All other components within normal limits  COMPREHENSIVE METABOLIC PANEL - Abnormal; Notable for the following components:   Calcium 8.3 (*)    Total Protein 6.1 (*)    Albumin 2.5 (*)    AST 10 (*)    GFR calc non Af Amer 60 (*)    All other components within normal limits  CBC - Abnormal; Notable for the following components:   RBC 3.50 (*)    Hemoglobin 9.8 (*)    HCT 33.1 (*)    MCHC 29.6 (*)    All other components within normal limits  HEMOGLOBIN A1C - Abnormal; Notable for the following components:   Hgb A1c MFr Bld 5.7 (*)    All other components within normal limits  IRON AND TIBC - Abnormal; Notable for the following components:   Iron 19 (*)    TIBC 220 (*)    Saturation Ratios 9 (*)    All other components within normal limits  FERRITIN - Abnormal; Notable for the following components:   Ferritin 331 (*)    All other components within normal limits  GLUCOSE, CAPILLARY - Abnormal; Notable for the  following components:   Glucose-Capillary 110 (*)    All other components within normal limits  BASIC METABOLIC PANEL - Abnormal; Notable for the following components:   Calcium 8.2 (*)    All other components within normal limits  CBC - Abnormal; Notable for the following components:   RBC 3.48 (*)    Hemoglobin 9.7 (*)    HCT 32.7 (*)    MCHC 29.7 (*)    All other components within normal limits  GLUCOSE, CAPILLARY - Abnormal; Notable for the following components:   Glucose-Capillary 107 (*)    All other components within normal limits  GLUCOSE, CAPILLARY - Abnormal; Notable for the following components:   Glucose-Capillary 131 (*)    All other components within normal limits  MRSA PCR SCREENING  TROPONIN I  TROPONIN I  TROPONIN I  GLUCOSE, CAPILLARY  GLUCOSE, CAPILLARY  PROCALCITONIN  GLUCOSE, CAPILLARY  PROCALCITONIN  URIC ACID  GLUCOSE, CAPILLARY  I-STAT CG4 LACTIC ACID, ED    EKG None  Radiology Ct Abdomen Pelvis Wo Contrast  Result Date: 02/24/2018 CLINICAL DATA:  Pyelonephritis. EXAM: CT ABDOMEN AND PELVIS WITHOUT CONTRAST TECHNIQUE: Multidetector CT imaging of the abdomen and pelvis was performed following the standard protocol without IV contrast. COMPARISON:  CT scan 02/11/2018 FINDINGS: Lower chest: Persistent bilateral pleural effusions and overlying atelectasis. The heart is mildly enlarged but stable. Stable extensive aortic and coronary artery calcifications. Hepatobiliary: No focal hepatic lesions or intrahepatic biliary dilatation. The gallbladder surgically absent. Mild associated common bile duct dilatation. Pancreas: Moderate atrophy. No mass, inflammation or ductal dilatation. Spleen: Normal size.  No focal lesions. Adrenals/Urinary Tract: The adrenal glands are normal in stable. Double-J ureteral stent in place on the right side. Mild residual hydronephrosis. The right renal calculus is no longer identified. It is now in the right ureter adjacent to the  stent best seen on image number 42 of series 2 and image number 57 of series 5. No left-sided hydroureteronephrosis, renal or ureteral calculi. No bladder calculi. Stomach/Bowel: The stomach, duodenum, small bowel and colon are grossly normal without oral contrast. No inflammatory changes, mass lesions or obstructive findings. The terminal ileum and appendix are normal. Marked sigmoid colon diverticulosis without findings for acute diverticulitis. Vascular/Lymphatic: Severe atherosclerotic calcifications involving the aorta and branch vessels with stable areas of aneurysmal dilatation. No mesenteric or retroperitoneal mass or adenopathy. Reproductive: The uterus is surgically absent. Both ovaries are still present and appear normal. Other: No pelvic mass or free pelvic fluid collections. No inguinal mass or adenopathy. Musculoskeletal: Stable T12 compression fracture and mild L1 fracture and mild sclerosis. IMPRESSION: 1. The 4 mm upper pole calculus seen on the prior CT scan is now in the right upper ureter adjacent to the double-J ureteral stent at the L2-3 level. 2. Mild persistent right-sided hydronephrosis. 3. No acute abdominal findings otherwise. 4. Persistent small bilateral pleural effusions and overlying atelectasis. 5. Severe atherosclerotic disease. Electronically Signed   By: Marijo Sanes M.D.   On: 02/24/2018 12:04   Dg Chest 2 View  Result Date: 02/23/2018 CLINICAL DATA:  84 year old with hypoxia. EXAM: CHEST - 2 VIEW COMPARISON:  02/10/2018 FINDINGS: Stable enlargement of the cardiac silhouette. Atherosclerotic calcifications at the aortic arch. Densities at both lung bases are compatible with small pleural effusions and atelectasis/consolidation. Negative for pulmonary edema. Right arm PICC line has been removed. IMPRESSION: Persistent basilar chest densities, left side greater than right. Findings are suggestive for small effusions with  atelectasis/consolidation. Minimal change from the  previous examination. Stable cardiomegaly. Aortic atherosclerosis. Electronically Signed   By: Markus Daft M.D.   On: 02/23/2018 17:43   Dg Knee 1-2 Views Left  Result Date: 02/24/2018 CLINICAL DATA:  Left knee pain following a fall. EXAM: LEFT KNEE - 1-2 VIEW COMPARISON:  02/03/2018. FINDINGS: Marked lateral joint space narrowing with progression. Extensive tricompartmental spur formation. Moderate-sized effusion. Extensive atheromatous arterial calcifications. No fracture or dislocation seen. IMPRESSION: 1. No fracture or dislocation. 2. Extensive tricompartmental degenerative changes with progression. 3. Interval moderate sized effusion. Electronically Signed   By: Claudie Revering M.D.   On: 02/24/2018 17:00   Ct Head Wo Contrast  Result Date: 02/23/2018 CLINICAL DATA:  Pt states she fell onto her forehead. EXAM: CT HEAD WITHOUT CONTRAST CT CERVICAL SPINE WITHOUT CONTRAST TECHNIQUE: Multidetector CT imaging of the head and cervical spine was performed following the standard protocol without intravenous contrast. Multiplanar CT image reconstructions of the cervical spine were also generated. COMPARISON:  None. FINDINGS: CT HEAD FINDINGS Brain: There is significant central cortical atrophy. Periventricular white matter changes are present. There is no intra or extra-axial fluid collection or mass lesion. The basilar cisterns and ventricles have a normal appearance. There is no CT evidence for acute infarction or hemorrhage. Vascular: There is dense atherosclerotic calcification of the internal carotid arteries. No hyperdense vessels. Skull: Normal. Negative for fracture or focal lesion. Sinuses/Orbits: No acute finding. Other: Partially imaged LEFT frontal scalp edema and laceration. No underlying fracture. CT CERVICAL SPINE FINDINGS Alignment: Vertebral bodies are normally aligned with preservation of the normal cervical lordosis.There is degenerative type anterolisthesis of C4 on C5 measuring 2 millimeters.  Skull base and vertebrae: No acute fracture. No primary bone lesion or focal pathologic process. Soft tissues and spinal canal: No prevertebral fluid or swelling. No visible canal hematoma. There is extensive atherosclerotic calcification of the carotid arteries. Disc levels:  Mild disc height loss throughout the cervical spine. Upper chest: There is a LEFT pleural effusion. A partially imaged thoracic aorta shows significant atherosclerotic calcification. Other: None IMPRESSION: 1.  No evidence for acute intracranial abnormality. 2. Atrophy and small vessel disease. 3. LEFT frontal scalp laceration and edema. 4. Degenerative changes in the cervical spine without evidence for acute cervical spine fracture. 5. Significant atherosclerotic calcification of the carotid arteries. 6. LEFT pleural effusion. Recommend further evaluation with two-view chest x-ray. 7.  Aortic atherosclerosis.  (ICD10-I70.0) Electronically Signed   By: Nolon Nations M.D.   On: 02/23/2018 16:30   Ct Cervical Spine Wo Contrast  Result Date: 02/23/2018 CLINICAL DATA:  Pt states she fell onto her forehead. EXAM: CT HEAD WITHOUT CONTRAST CT CERVICAL SPINE WITHOUT CONTRAST TECHNIQUE: Multidetector CT imaging of the head and cervical spine was performed following the standard protocol without intravenous contrast. Multiplanar CT image reconstructions of the cervical spine were also generated. COMPARISON:  None. FINDINGS: CT HEAD FINDINGS Brain: There is significant central cortical atrophy. Periventricular white matter changes are present. There is no intra or extra-axial fluid collection or mass lesion. The basilar cisterns and ventricles have a normal appearance. There is no CT evidence for acute infarction or hemorrhage. Vascular: There is dense atherosclerotic calcification of the internal carotid arteries. No hyperdense vessels. Skull: Normal. Negative for fracture or focal lesion. Sinuses/Orbits: No acute finding. Other: Partially  imaged LEFT frontal scalp edema and laceration. No underlying fracture. CT CERVICAL SPINE FINDINGS Alignment: Vertebral bodies are normally aligned with preservation of the normal cervical lordosis.There is degenerative type  anterolisthesis of C4 on C5 measuring 2 millimeters. Skull base and vertebrae: No acute fracture. No primary bone lesion or focal pathologic process. Soft tissues and spinal canal: No prevertebral fluid or swelling. No visible canal hematoma. There is extensive atherosclerotic calcification of the carotid arteries. Disc levels:  Mild disc height loss throughout the cervical spine. Upper chest: There is a LEFT pleural effusion. A partially imaged thoracic aorta shows significant atherosclerotic calcification. Other: None IMPRESSION: 1.  No evidence for acute intracranial abnormality. 2. Atrophy and small vessel disease. 3. LEFT frontal scalp laceration and edema. 4. Degenerative changes in the cervical spine without evidence for acute cervical spine fracture. 5. Significant atherosclerotic calcification of the carotid arteries. 6. LEFT pleural effusion. Recommend further evaluation with two-view chest x-ray. 7.  Aortic atherosclerosis.  (ICD10-I70.0) Electronically Signed   By: Nolon Nations M.D.   On: 02/23/2018 16:30   US Renal  Result Date: 02/25/2018 CLINICAL DATA:  Followup for right hydronephrosis. EXAM: RENAL / URINARY TRACT ULTRASOUND COMPLETE COMPARISON:  CT, 02/24/2018. FINDINGS: Right Kidney: Length: 10.2 cm. Normal parenchymal echogenicity. The ureteral stent noted on the previous day's CT is not visualized. There is no hydronephrosis. 2.3 x 3.0 x 3.3 cm cyst arises from the lower pole mildly increased in size from a renal ultrasound dated 07/03/2017. No other renal masses. No visualized stones. Left Kidney: Length: 10.5 cm. Echogenicity within normal limits. No mass or hydronephrosis visualized. Bladder: Not visualized, decompressed with a Foley catheter. IMPRESSION: 1. No  acute findings.  No hydronephrosis. 2. 3.3 cm lower pole right renal cyst. Electronically Signed   By: Lajean Manes M.D.   On: 02/25/2018 12:06    Procedures .Marland KitchenLaceration Repair Date/Time: 02/23/2018 7:01 PM Performed by: Julianne Rice, MD Authorized by: Julianne Rice, MD   Anesthesia (see MAR for exact dosages):    Anesthesia method:  None Laceration details:    Location:  Face   Face location:  L eyebrow   Length (cm):  2 Repair type:    Repair type:  Simple Exploration:    Wound extent: no foreign bodies/material noted     Contaminated: no   Treatment:    Irrigation solution:  Sterile saline Skin repair:    Repair method:  Steri-Strips and tissue adhesive   Number of Steri-Strips:  1 Approximation:    Approximation:  Close Post-procedure details:    Patient tolerance of procedure:  Tolerated well, no immediate complications   (including critical care time)  Medications Ordered in ED Medications  enoxaparin (LOVENOX) injection 40 mg (40 mg Subcutaneous Given 02/24/18 2212)  acetaminophen (TYLENOL) tablet 650 mg (has no administration in time range)    Or  acetaminophen (TYLENOL) suppository 650 mg (has no administration in time range)  aspirin EC tablet 81 mg (81 mg Oral Given 02/25/18 0836)  rosuvastatin (CRESTOR) tablet 40 mg (40 mg Oral Given 02/24/18 1830)  DULoxetine (CYMBALTA) DR capsule 90 mg (90 mg Oral Given 02/25/18 0835)  QUEtiapine (SEROQUEL) tablet 25 mg (25 mg Oral Given 02/24/18 2205)  gabapentin (NEURONTIN) capsule 200 mg (200 mg Oral Given 02/24/18 2206)  cholecalciferol (VITAMIN D) tablet 2,000 Units (2,000 Units Oral Given 02/25/18 0836)  meropenem (MERREM) 1 g in sodium chloride 0.9 % 100 mL IVPB (1 g Intravenous New Bag/Given 02/25/18 1326)  insulin aspart (novoLOG) injection 0-9 Units (1 Units Subcutaneous Given 02/25/18 1300)  insulin aspart (novoLOG) injection 0-5 Units (0 Units Subcutaneous Not Given 02/24/18 2239)  morphine (MSIR)  tablet 15 mg (15 mg Oral  Given 02/25/18 1326)  metoprolol succinate (TOPROL-XL) 24 hr tablet 25 mg (has no administration in time range)  metoprolol succinate (TOPROL-XL) 24 hr tablet 12.5 mg (has no administration in time range)  sodium chloride 0.9 % bolus 500 mL (0 mLs Intravenous Stopped 02/23/18 1850)  albuterol (PROVENTIL) (2.5 MG/3ML) 0.083% nebulizer solution 5 mg (5 mg Nebulization Given 02/23/18 1732)  sodium chloride 0.9 % bolus 500 mL (0 mLs Intravenous Stopped 02/23/18 2039)  piperacillin-tazobactam (ZOSYN) IVPB 3.375 g (0 g Intravenous Stopped 02/23/18 2039)     Initial Impression / Assessment and Plan / ED Course  I have reviewed the triage vital signs and the nursing notes.  Pertinent labs & imaging results that were available during my care of the patient were reviewed by me and considered in my medical decision making (see chart for details).     Called patient bedside for mild hypotension.  Patient is easily aroused.  Following commands.  Noted to be hypoxic with oxygen at 88%.  Mild scattered diffuse wheezes.  Will give albuterol neb and IV fluids.  Will get chest x-ray.  CT head and cervical spine without acute findings.  Blood pressure improved with IV fluids.  Patient does appear to have evidence of urinary tract infection.  ESBL has grown out from previous cultures.  Given dose of Zosyn and urine culture sent.  Discussed with urology.  Given patient has no flank pain, does not believe that emergent imaging is necessary at this point.  Discussed with hospitalist will see patient in the emergency department and admit. Final Clinical Impressions(s) / ED Diagnoses   Final diagnoses:  Hydronephrosis of right kidney  Leg pain    ED Discharge Orders    None       Julianne Rice, MD 02/25/18 1514

## 2018-02-23 NOTE — ED Notes (Signed)
Pharm tech to room

## 2018-02-23 NOTE — ED Notes (Signed)
[name], [place] will transport to floor

## 2018-02-23 NOTE — ED Notes (Signed)
Dr Yelverton in to assess 

## 2018-02-24 ENCOUNTER — Encounter (HOSPITAL_COMMUNITY): Payer: Self-pay | Admitting: Radiology

## 2018-02-24 ENCOUNTER — Observation Stay (HOSPITAL_COMMUNITY): Payer: Medicare Other

## 2018-02-24 DIAGNOSIS — I714 Abdominal aortic aneurysm, without rupture: Secondary | ICD-10-CM | POA: Diagnosis not present

## 2018-02-24 DIAGNOSIS — N390 Urinary tract infection, site not specified: Secondary | ICD-10-CM

## 2018-02-24 DIAGNOSIS — E782 Mixed hyperlipidemia: Secondary | ICD-10-CM

## 2018-02-24 DIAGNOSIS — G9340 Encephalopathy, unspecified: Secondary | ICD-10-CM

## 2018-02-24 DIAGNOSIS — I10 Essential (primary) hypertension: Secondary | ICD-10-CM | POA: Diagnosis not present

## 2018-02-24 DIAGNOSIS — I482 Chronic atrial fibrillation, unspecified: Secondary | ICD-10-CM

## 2018-02-24 DIAGNOSIS — N132 Hydronephrosis with renal and ureteral calculous obstruction: Secondary | ICD-10-CM | POA: Diagnosis not present

## 2018-02-24 DIAGNOSIS — G894 Chronic pain syndrome: Secondary | ICD-10-CM | POA: Diagnosis not present

## 2018-02-24 DIAGNOSIS — M25462 Effusion, left knee: Secondary | ICD-10-CM | POA: Diagnosis not present

## 2018-02-24 DIAGNOSIS — Z1612 Extended spectrum beta lactamase (ESBL) resistance: Secondary | ICD-10-CM | POA: Diagnosis not present

## 2018-02-24 DIAGNOSIS — B9629 Other Escherichia coli [E. coli] as the cause of diseases classified elsewhere: Secondary | ICD-10-CM

## 2018-02-24 LAB — CBC
HCT: 33.1 % — ABNORMAL LOW (ref 36.0–46.0)
Hemoglobin: 9.8 g/dL — ABNORMAL LOW (ref 12.0–15.0)
MCH: 28 pg (ref 26.0–34.0)
MCHC: 29.6 g/dL — ABNORMAL LOW (ref 30.0–36.0)
MCV: 94.6 fL (ref 80.0–100.0)
Platelets: 208 10*3/uL (ref 150–400)
RBC: 3.5 MIL/uL — ABNORMAL LOW (ref 3.87–5.11)
RDW: 13.6 % (ref 11.5–15.5)
WBC: 7.4 10*3/uL (ref 4.0–10.5)
nRBC: 0 % (ref 0.0–0.2)

## 2018-02-24 LAB — COMPREHENSIVE METABOLIC PANEL
ALT: 7 U/L (ref 0–44)
AST: 10 U/L — ABNORMAL LOW (ref 15–41)
Albumin: 2.5 g/dL — ABNORMAL LOW (ref 3.5–5.0)
Alkaline Phosphatase: 57 U/L (ref 38–126)
Anion gap: 7 (ref 5–15)
BUN: 18 mg/dL (ref 8–23)
CO2: 28 mmol/L (ref 22–32)
Calcium: 8.3 mg/dL — ABNORMAL LOW (ref 8.9–10.3)
Chloride: 105 mmol/L (ref 98–111)
Creatinine, Ser: 0.88 mg/dL (ref 0.44–1.00)
GFR calc Af Amer: >60 mL/min (ref >60)
GFR calc non Af Amer: 60 mL/min — ABNORMAL LOW (ref >60)
Glucose, Bld: 99 mg/dL (ref 70–99)
Potassium: 3.8 mmol/L (ref 3.5–5.1)
Sodium: 140 mmol/L (ref 135–145)
Total Bilirubin: 1.1 mg/dL (ref 0.3–1.2)
Total Protein: 6.1 g/dL — ABNORMAL LOW (ref 6.5–8.1)

## 2018-02-24 LAB — IRON AND TIBC
Iron: 19 ug/dL — ABNORMAL LOW (ref 28–170)
Saturation Ratios: 9 % — ABNORMAL LOW (ref 10.4–31.8)
TIBC: 220 ug/dL — ABNORMAL LOW (ref 250–450)
UIBC: 201 ug/dL

## 2018-02-24 LAB — GLUCOSE, CAPILLARY
Glucose-Capillary: 72 mg/dL (ref 70–99)
Glucose-Capillary: 73 mg/dL (ref 70–99)
Glucose-Capillary: 110 mg/dL — ABNORMAL HIGH (ref 70–99)
Glucose-Capillary: 107 mg/dL — ABNORMAL HIGH (ref 70–99)

## 2018-02-24 LAB — HEMOGLOBIN A1C
Hgb A1c MFr Bld: 5.7 % — ABNORMAL HIGH (ref 4.8–5.6)
Mean Plasma Glucose: 116.89 mg/dL

## 2018-02-24 LAB — FERRITIN: Ferritin: 331 ng/mL — ABNORMAL HIGH (ref 11–307)

## 2018-02-24 LAB — PROCALCITONIN: Procalcitonin: <0.1 ng/mL

## 2018-02-24 LAB — TROPONIN I
Troponin I: <0.03 ng/mL (ref <0.03)
Troponin I: <0.03 ng/mL (ref <0.03)

## 2018-02-24 MED ORDER — METOPROLOL SUCCINATE ER 25 MG PO TB24
12.5000 mg | ORAL_TABLET | Freq: Every day | ORAL | Status: DC
  Administered 2018-02-24 – 2018-02-25 (×2): 12.5 mg via ORAL
  Filled 2018-02-24 (×2): qty 1

## 2018-02-24 NOTE — Progress Notes (Signed)
Patient c/o neck and shoulder pain. Morphine given as scheduled. Rolled towel placed behind patient's neck. Patient states relief. Patient's left knee very tender to touch. Ice pack placed on patient's knee. Patient states relief. Will continue to monitor.

## 2018-02-24 NOTE — Progress Notes (Signed)
Discussed findings of CT abd/pelvis today with Dr. Jeffie Pollock.  Place foley to decompress R-kidney Repeat renal US in am Daughter in room updated Obtain xray left knee

## 2018-02-24 NOTE — Progress Notes (Addendum)
PROGRESS NOTE  [name] YHC:[id number] DOB: [birth year] DOA: 02/23/2018 PCP: [name], MD  Brief History:  84 year old female with a history of dementia, chronic atrial fibrillation, impaired glucose tolerance, spinal stenosis, hypertension, hyperlipidemia presented after an unwitnessed fall from her nursing facility, [facility].  This represents the patient's fourth hospital admission since January 30, 2018.  Apparently, there was some concern the patient may have been confused.  As result, the patient was brought to emergency department for further evaluation.  Unfortunately, the patient is unable to provide a significant history secondary to her cognitive impairment.  On her part, the patient denies any chest pain, shortness breath, nausea no vomiting, diarrhea, abdominal pain, dysuria.  However she complains of some pain in her forehead where she fell and hit her head on the evening of 02/23/2018.  The patient was most recently admitted to the hospital from 02/09/2018 through 02/12/2018 for acute metabolic encephalopathy.  It was thought to been due to her continued infectious process.  In addition, the patient was initially admitted from 01/30/2018 through 02/03/2018.  During that admission, the patient was noted to have an infected right ureteral stone.  She underwent cystoureteroscopy with placement of double-J stent in the right ureter by Dr. Louis Meckel.  Cultures and urine cultures grew ESBL E. coli.  The patient was discharged back to nursing facility with a PICC line to finish ertapenem with anticipated stop date 02/14/2018.  However, on her most recent admission, the patient's PICC line was noted to be displaced.  It was removed on 02/10/2018.  The patient's family deferred placement of another PICC line.  However, from the documentation, it is unclear what type of IV access, if any, was placed to finish off the patient's course of ertapenem at the time of discharge on  02/12/2018.  Because of the the patient's fall and forehead laceration and concerns for confusion, the patient was admitted for further evaluation.  The patient has remained afebrile hemodynamically stable saturating 90% on room air.  Patient was noted to be mildly hypertensive in the emergency department and fluid resuscitated with improvement of her blood pressure.  Lactic acid was 0.79.  BMP and LFTs were unremarkable.  WBC was 10.5 at the time of admission.  Assessment/Plan: Acute metabolic encephalopathy -Likely multifactorial including the UTI, opioids, dehydration and possible sundowning in the setting of underlying dementia. -02/24/2018, the patient's mental status appears to be improving -02/10/2018 serum B12-297, ammonia 15, TSH 3.641, RPR nonreactive, RBC folate 1338  Pyuria/UTI -UA with greater than 50 WBC -Continue meropenem pending urine culture data -in the setting of recent JJ-stent placement from ureteral stone, obtain CT abd/pelvis  Hypotension -lactic acid 0.79 -improved with IVF  Nephrolithiasis with ureteral obstruction status post stent -01/31/18--right ureteral stent placed, Dr. Louis Meckel -will need outpatient follow-up for stent removal -02/11/2018 CT abdomen and pelvis--persistent mild right pelvic caliectasis with hyperenhancement of the right ureteral collecting system and ureteral urothelium; interval placement of right double-J stent  Mechanical fall with forehead laceration -The patient is not a candidate for anticoagulation secondary to her numerous falls -Steri-Strips were placed in the emergency department -Local wound care  Chronic atrial fibrillation -Rate controlled -Restart metoprolol succinate at lower dose secondary to soft blood pressure -CHADSVASc = 4 (HTN, Age, female) -not a candidate for [facility] due to multiple falls  Chronic pain syndrome -Continue home dose of gabapentin and formulary equivalent for Opana  Hyperlipidemia -Continue  Crestor  [name] -02/11/18 CT abd = 3.9 cm -outpt surveillance   Disposition Plan:  SNF in 1-2 days  Family Communication:  No Family at bedside  Consultants:  none  Code Status:  DNR  DVT Prophylaxis:   [place] Lovenox   Procedures: As Listed in Progress Note Above  Antibiotics: Merrem 10/25>>>   Subjective: Patient complains of pain in the forehead.  She denies any visual disturbance, other headache, chest pain, shortness breath, hemoptysis, nausea, vomiting, diarrhea, abdominal pain, dysuria, hematuria.  Objective: Vitals:   02/23/18 2030 02/23/18 2105 02/24/18 0500 02/24/18 0614  BP: 108/79 105/82  118/88  Pulse:  84  97  Resp: 17 18  18   Temp:  98.3 F (36.8 C)  (!) 97.5 F (36.4 C)  TempSrc:  Oral  Oral  SpO2:  97%  98%  Weight:   70.9 kg   Height:        Intake/Output Summary (Last 24 hours) at 02/24/2018 0858 Last data filed at 02/24/2018 0500 Gross per 24 hour  Intake 877.21 ml  Output -  Net 877.21 ml   Weight change:  Exam:   General:  Pt is alert, follows commands appropriately, not in acute distress  HEENT: No icterus, No thrush, No neck mass, [place]/AT  Cardiovascular: IRRR, S1/S2, no rubs, no gallops  Respiratory: Bibasilar crackles but no wheezing.  Good air movement.  Abdomen: Soft/+BS, non tender, non distended, no guarding  Extremities: trace LE edema, No lymphangitis, No petechiae, No rashes, no synovitis   Data Reviewed: I have personally reviewed following labs and imaging studies Basic Metabolic Panel: Recent Labs  Lab 02/23/18 1536 02/24/18 0310  NA 137 140  K 4.3 3.8  CL 101 105  CO2 29 28  GLUCOSE 119* 99  BUN 22 18  CREATININE 0.86 0.88  CALCIUM 8.6* 8.3*   Liver Function Tests: Recent Labs  Lab 02/24/18 0310  AST 10*  ALT 7  ALKPHOS 57  BILITOT 1.1  PROT 6.1*  ALBUMIN 2.5*   No results for input(s): LIPASE, AMYLASE in the last 168 hours. No results for input(s): AMMONIA in the last 168 hours. Coagulation  Profile: No results for input(s): INR, PROTIME in the last 168 hours. CBC: Recent Labs  Lab 02/23/18 1536 02/24/18 0310  WBC 10.5 7.4  NEUTROABS 7.5  --   HGB 10.9* 9.8*  HCT 36.2 33.1*  MCV 92.6 94.6  PLT 251 208   Cardiac Enzymes: Recent Labs  Lab 02/23/18 1956 02/24/18 0310  TROPONINI <0.03 <0.03   BNP: Invalid input(s): POCBNP CBG: Recent Labs  Lab 02/23/18 2153 02/24/18 0731  GLUCAP 83 72   HbA1C: No results for input(s): HGBA1C in the last 72 hours. Urine analysis:    Component Value Date/Time   COLORURINE AMBER (A) 02/23/2018 1846   APPEARANCEUR CLOUDY (A) 02/23/2018 1846   LABSPEC 1.018 02/23/2018 1846   PHURINE 6.0 02/23/2018 1846   GLUCOSEU NEGATIVE 02/23/2018 1846   GLUCOSEU NEG mg/dL 03/08/2007 2031   HGBUR MODERATE (A) 02/23/2018 1846   HGBUR negative 03/06/2008 0846   BILIRUBINUR NEGATIVE 02/23/2018 1846   KETONESUR NEGATIVE 02/23/2018 1846   PROTEINUR 100 (A) 02/23/2018 1846   UROBILINOGEN 0.2 09/20/2013 1535   NITRITE POSITIVE (A) 02/23/2018 1846   LEUKOCYTESUR LARGE (A) 02/23/2018 1846   Sepsis Labs: @LABRCNTIP (procalcitonin:4,lacticidven:4) ) Recent Results (from the past 240 hour(s))  MRSA PCR Screening     Status: None   Collection Time: 02/23/18  9:30 PM  Result Value Ref Range Status  MRSA by PCR NEGATIVE NEGATIVE Final    Comment:        The GeneXpert MRSA Assay (FDA approved for NASAL specimens only), is one component of a comprehensive MRSA colonization surveillance program. It is not intended to diagnose MRSA infection nor to guide or monitor treatment for MRSA infections. Performed at [facility], [address], [place], [place] [address]      Scheduled Meds: . aspirin EC  81 mg Oral Daily  . cholecalciferol  2,000 Units Oral Daily  . DULoxetine  90 mg Oral Daily  . enoxaparin (LOVENOX) injection  40 mg Subcutaneous Q24H  . gabapentin  200 mg Oral QHS  . insulin aspart  0-5 Units Subcutaneous QHS  . insulin  aspart  0-9 Units Subcutaneous TID WC  . morphine  15 mg Oral Q8H  . QUEtiapine  25 mg Oral QHS  . rosuvastatin  40 mg Oral QPM   Continuous Infusions: . sodium chloride 75 mL/hr at 02/23/18 2120  . meropenem (MERREM) IV 1 g (02/24/18 0555)    Procedures/Studies: Ct Abdomen Pelvis W Wo Contrast  Result Date: 02/11/2018 CLINICAL DATA:  Patient with sepsis.  Pyelonephritis. EXAM: CT ABDOMEN AND PELVIS WITHOUT AND WITH CONTRAST TECHNIQUE: Multidetector CT imaging of the abdomen and pelvis was performed following the standard protocol before and following the bolus administration of intravenous contrast. CONTRAST:  142mL ISOVUE-300 IOPAMIDOL (ISOVUE-300) INJECTION 61% COMPARISON:  CT abdomen pelvis 01/30/2018 FINDINGS: Lower chest: Heart is enlarged. Coronary arterial vascular calcifications. Moderate layering bilateral pleural effusions. Subpleural consolidation within the lower lobes bilaterally favored to represent atelectasis. Hepatobiliary: The liver is normal in size and contour. No focal hepatic lesions identified. Gallbladder is surgically absent. Pancreas: Unremarkable Spleen: Unremarkable Adrenals/Urinary Tract: Normal adrenal glands. Noncontrast images demonstrate a 4 mm stone within the superior aspect of the right renal collecting system (image 26; series 2). Mild right pelviectasis. Right double-J nephroureteral stent is in place. Kidneys enhance symmetrically with contrast. There is right renal collecting system and ureteral urothelial thickening and hyperenhancement. Urinary bladder is decompressed with a Foley catheter. Small amount of gas within the urinary bladder. Stomach/Bowel: Sigmoid colonic diverticulosis. No CT evidence for acute diverticulitis. No evidence for small bowel obstruction. No free fluid or free intraperitoneal air. Normal morphology of the stomach. Vascular/Lymphatic: Tortuous dilated abdominal aorta measuring up to 3.9 cm (image 29; series 5). Peripheral calcified  atherosclerotic plaque. Atherosclerotic narrowing of the proximal aspect of the celiac axis and superior mesenteric artery. No retroperitoneal lymphadenopathy. Reproductive: Status post hysterectomy. Other: None. Musculoskeletal: Similar-appearing T12 and L5 subacute compression deformities. IMPRESSION: 1. Interval insertion of right-sided double-J nephroureteral stent. Persistent mild right pelviectasis. There is hyperenhancement and thickening of the right renal collecting system and ureteral urothelium which may be reactive in etiology. Superimposed infection not excluded. 2. 4 mm stone superior pole right renal collecting system. 3. Abdominal aorta measures 3.9 cm. Recommend followup by ultrasound in 2 years. This recommendation follows ACR consensus guidelines: White Paper of the ACR Incidental Findings Committee II on Vascular Findings. J Am Coll Radiol 2013; 10:789-794. 4. Sigmoid colonic diverticulosis without evidence for acute diverticulitis. 5. T12 and L5 subacute compression deformities. Electronically Signed   By: Lovey Newcomer M.D.   On: 02/11/2018 16:32   Dg Chest 2 View  Result Date: 02/23/2018 CLINICAL DATA:  84 year old with hypoxia. EXAM: CHEST - 2 VIEW COMPARISON:  02/10/2018 FINDINGS: Stable enlargement of the cardiac silhouette. Atherosclerotic calcifications at the aortic arch. Densities at both lung bases are  compatible with small pleural effusions and atelectasis/consolidation. Negative for pulmonary edema. Right arm PICC line has been removed. IMPRESSION: Persistent basilar chest densities, left side greater than right. Findings are suggestive for small effusions with atelectasis/consolidation. Minimal change from the previous examination. Stable cardiomegaly. Aortic atherosclerosis. Electronically Signed   By: Markus Daft M.D.   On: 02/23/2018 17:43   Dg Chest 2 View  Result Date: 02/09/2018 CLINICAL DATA:  Mental status change.  Shortness of breath. EXAM: CHEST - 2 VIEW COMPARISON:   Chest x-rays dated 02/05/2018 and 02/03/2018. FINDINGS: Stable cardiomegaly. Stable small bilateral pleural effusions. Probable associated mild edema within the RIGHT lower lobe. No pneumothorax seen. RIGHT-sided PICC line is incompletely visualized, tip directed into the lower RIGHT neck. IMPRESSION: 1. PICC line has been displaced since the previous chest x-ray, with tip now directed upwards in the RIGHT neck (tip presumably in the RIGHT internal jugular vein). Recommend repositioning. 2. Stable small bilateral pleural effusions. Probable associated mild edema within the RIGHT lower lobe. 3. Stable cardiomegaly. Electronically Signed   By: Franki Cabot M.D.   On: 02/09/2018 17:45   Ct Head Wo Contrast  Result Date: 02/23/2018 CLINICAL DATA:  Pt states she fell onto her forehead. EXAM: CT HEAD WITHOUT CONTRAST CT CERVICAL SPINE WITHOUT CONTRAST TECHNIQUE: Multidetector CT imaging of the head and cervical spine was performed following the standard protocol without intravenous contrast. Multiplanar CT image reconstructions of the cervical spine were also generated. COMPARISON:  None. FINDINGS: CT HEAD FINDINGS Brain: There is significant central cortical atrophy. Periventricular white matter changes are present. There is no intra or extra-axial fluid collection or mass lesion. The basilar cisterns and ventricles have a normal appearance. There is no CT evidence for acute infarction or hemorrhage. Vascular: There is dense atherosclerotic calcification of the internal carotid arteries. No hyperdense vessels. Skull: Normal. Negative for fracture or focal lesion. Sinuses/Orbits: No acute finding. Other: Partially imaged LEFT frontal scalp edema and laceration. No underlying fracture. CT CERVICAL SPINE FINDINGS Alignment: Vertebral bodies are normally aligned with preservation of the normal cervical lordosis.There is degenerative type anterolisthesis of C4 on C5 measuring 2 millimeters. Skull base and vertebrae: No  acute fracture. No primary bone lesion or focal pathologic process. Soft tissues and spinal canal: No prevertebral fluid or swelling. No visible canal hematoma. There is extensive atherosclerotic calcification of the carotid arteries. Disc levels:  Mild disc height loss throughout the cervical spine. Upper chest: There is a LEFT pleural effusion. A partially imaged thoracic aorta shows significant atherosclerotic calcification. Other: None IMPRESSION: 1.  No evidence for acute intracranial abnormality. 2. Atrophy and small vessel disease. 3. LEFT frontal scalp laceration and edema. 4. Degenerative changes in the cervical spine without evidence for acute cervical spine fracture. 5. Significant atherosclerotic calcification of the carotid arteries. 6. LEFT pleural effusion. Recommend further evaluation with two-view chest x-ray. 7.  Aortic atherosclerosis.  (ICD10-I70.0) Electronically Signed   By: Nolon Nations M.D.   On: 02/23/2018 16:30   Ct Head Wo Contrast  Result Date: 02/09/2018 CLINICAL DATA:  Confusion and delirium with altered level of consciousness. EXAM: CT HEAD WITHOUT CONTRAST TECHNIQUE: Contiguous axial images were obtained from the base of the skull through the vertex without intravenous contrast. COMPARISON:  CT exams dating back to 07/03/2017 FINDINGS: Brain: Atrophy with chronic moderate small vessel ischemia. General calcifications and along the right parietal lobe. Remote right pontine lacunar infarct. No large vascular territory infarction, hemorrhage or midline shift. No intra-axial mass nor extra-axial fluid. Midline  fourth ventricle and basal cisterns. Vascular: Vascular calcifications along both vertebral and cavernous internal carotid arteries. No hyperdense vessel sign. Skull: No acute skull fracture. Sinuses/Orbits: Bilateral lens replacements. The paranasal sinuses mastoids. Other: Dystrophic intramuscular calcifications redemonstrated along the right infratemporal fossa.  IMPRESSION: 1. Atrophy with chronic moderate small vessel ischemia. Chronic right pontine lacunar infarct. 2. No acute intracranial abnormality. Electronically Signed   By: Ashley Royalty M.D.   On: 02/09/2018 20:06   Ct Angio Chest Pe W And/or Wo Contrast  Result Date: 02/04/2018 CLINICAL DATA:  Complex chest pain, intermediate clinical probability for pulmonary embolism, hypoxia, history atrial fibrillation, dementia, diabetes mellitus, hypertension EXAM: CT ANGIOGRAPHY CHEST WITH CONTRAST TECHNIQUE: Multidetector CT imaging of the chest was performed using the standard protocol during bolus administration of intravenous contrast. Multiplanar CT image reconstructions and MIPs were obtained to evaluate the vascular anatomy. CONTRAST:  167mL ISOVUE-370 IOPAMIDOL (ISOVUE-370) INJECTION 76% IV COMPARISON:  09/04/2017 FINDINGS: Cardiovascular: Extensive atherosclerotic calcifications aorta, proximal great vessels and coronary arteries. Ascending thoracic aorta upper normal caliber. Pulmonary arteries well opacified and patent. No evidence of pulmonary embolism. Enlargement of cardiac chambers. No pericardial effusion. Mediastinum/Nodes: Esophagus unremarkable. Base of cervical region normal appearance. No thoracic adenopathy. Lungs/Pleura: BILATERAL pleural effusions with atelectasis of the posterior lower lobes bilaterally. Remaining lungs clear though scattered respiratory motion artifacts are present. Upper Abdomen: Last image demonstrates ectasia of the abdominal aorta, noted on the 09/14/2017 exam, inadequately assessed on this study. Remaining visualized upper abdomen unremarkable. Musculoskeletal: Bones demineralized. Marked compression fracture of T12 vertebral body increased since previous exam. Review of the MIP images confirms the above findings. IMPRESSION: No evidence of pulmonary embolism. Extensive atherosclerotic calcifications of aorta, proximal great vessels and coronary arteries. Small BILATERAL  pleural effusions and bibasilar atelectasis. Increased compression fracture of T12 vertebral body. Aortic Atherosclerosis (ICD10-I70.0). Electronically Signed   By: Lavonia Dana M.D.   On: 02/04/2018 13:16   Ct Cervical Spine Wo Contrast  Result Date: 02/23/2018 CLINICAL DATA:  Pt states she fell onto her forehead. EXAM: CT HEAD WITHOUT CONTRAST CT CERVICAL SPINE WITHOUT CONTRAST TECHNIQUE: Multidetector CT imaging of the head and cervical spine was performed following the standard protocol without intravenous contrast. Multiplanar CT image reconstructions of the cervical spine were also generated. COMPARISON:  None. FINDINGS: CT HEAD FINDINGS Brain: There is significant central cortical atrophy. Periventricular white matter changes are present. There is no intra or extra-axial fluid collection or mass lesion. The basilar cisterns and ventricles have a normal appearance. There is no CT evidence for acute infarction or hemorrhage. Vascular: There is dense atherosclerotic calcification of the internal carotid arteries. No hyperdense vessels. Skull: Normal. Negative for fracture or focal lesion. Sinuses/Orbits: No acute finding. Other: Partially imaged LEFT frontal scalp edema and laceration. No underlying fracture. CT CERVICAL SPINE FINDINGS Alignment: Vertebral bodies are normally aligned with preservation of the normal cervical lordosis.There is degenerative type anterolisthesis of C4 on C5 measuring 2 millimeters. Skull base and vertebrae: No acute fracture. No primary bone lesion or focal pathologic process. Soft tissues and spinal canal: No prevertebral fluid or swelling. No visible canal hematoma. There is extensive atherosclerotic calcification of the carotid arteries. Disc levels:  Mild disc height loss throughout the cervical spine. Upper chest: There is a LEFT pleural effusion. A partially imaged thoracic aorta shows significant atherosclerotic calcification. Other: None IMPRESSION: 1.  No evidence for  acute intracranial abnormality. 2. Atrophy and small vessel disease. 3. LEFT frontal scalp laceration and edema. 4. Degenerative changes in  the cervical spine without evidence for acute cervical spine fracture. 5. Significant atherosclerotic calcification of the carotid arteries. 6. LEFT pleural effusion. Recommend further evaluation with two-view chest x-ray. 7.  Aortic atherosclerosis.  (ICD10-I70.0) Electronically Signed   By: Nolon Nations M.D.   On: 02/23/2018 16:30   Dg Chest Port 1 View  Result Date: 02/10/2018 CLINICAL DATA:  Right peripheral picc placement EXAM: PORTABLE CHEST 1 VIEW COMPARISON:  Chest x-ray dated 02/09/2018. FINDINGS: RIGHT-sided PICC line tip is still directed into the RIGHT lower neck, presumably within the internal jugular vein. Stable cardiomegaly. Overall cardiomediastinal silhouette is peers stable. Persistent opacity at the LEFT lung base, likely small pleural effusion and/or atelectasis. No new lung findings. No pneumothorax seen. IMPRESSION: 1. RIGHT-sided PICC line is again directed into the lower RIGHT neck with tip presumably within the internal jugular vein. 2. Persistent opacity at the LEFT lung base, compatible with the small LEFT pleural effusion and associated atelectasis demonstrated on earlier chest CT of 02/04/2018. Probable small residual pleural effusion at the RIGHT lung base. No new lung findings. No pneumothorax. These results will be called to the ordering clinician or representative by the Radiologist Assistant, and communication documented in the PACS or zVision Dashboard. Electronically Signed   By: Franki Cabot M.D.   On: 02/10/2018 11:30   Dg Chest Port 1 View  Result Date: 02/05/2018 CLINICAL DATA:  Hypoxia. EXAM: PORTABLE CHEST 1 VIEW COMPARISON:  Chest CT yesterday. FINDINGS: Right upper extremity PICC tip in the proximal SVC. Again seen cardiomegaly with tortuous atherosclerotic thoracic aorta. Bilateral pleural effusions, unchanged of the  left but worsened on the right. Slight vascular congestion. No pulmonary edema or pneumothorax. IMPRESSION: 1. Bilateral pleural effusions, increasing on the right and unchanged on the left. 2. Cardiomegaly with slight vascular congestion. Atherosclerotic tortuous thoracic aorta. Electronically Signed   By: Keith Rake M.D.   On: 02/05/2018 05:21   Dg Chest Port 1 View  Result Date: 02/03/2018 CLINICAL DATA:  Low O2 sats. EXAM: PORTABLE CHEST 1 VIEW COMPARISON:  February 02, 2018 FINDINGS: There is a left-sided pleural effusion with underlying opacity, similar in the interval. The probable tiny layering effusion on the right seen yesterday is not definitely seen today. A right PICC line is in good position. No pneumothorax. Stable cardiomegaly. No other change. IMPRESSION: 1. The right PICC line is in good position. 2. Left-sided pleural effusion with underlying opacity, not significantly changed. Electronically Signed   By: Dorise Bullion III M.D   On: 02/03/2018 19:31   Dg Chest Port 1 View  Result Date: 02/02/2018 CLINICAL DATA:  PICC line placement evaluation. EXAM: PORTABLE CHEST 1 VIEW COMPARISON:  Sep 04, 2017 FINDINGS: A right PICC line terminates in the central SVC. Stable cardiomegaly. Small bilateral effusions are new. No other interval changes or acute abnormalities. IMPRESSION: 1. The right PICC line terminates in the central SVC. 2. Small bilateral effusions with underlying atelectasis, new in the interval. Electronically Signed   By: Dorise Bullion III M.D   On: 02/02/2018 21:27   Dg Knee Complete 4 Views Left  Result Date: 02/03/2018 CLINICAL DATA:  Left knee pain today. EXAM: LEFT KNEE - COMPLETE 4+ VIEW COMPARISON:  Remote radiographs 12/16/2005 FINDINGS: No fracture or dislocation. Tricompartmental osteoarthritis with peripheral spurring, progressed from remote prior exam. Lateral tibiofemoral joint space narrowing. There is tibiofemoral chondrocalcinosis. Small joint effusion.  Advanced vascular calcifications. IMPRESSION: Moderate to advanced tricompartmental osteoarthritis without acute osseous abnormality. Electronically Signed   By: Threasa Beards  Sanford M.D.   On: 02/03/2018 23:27   Dg C-arm 1-60 Min-no Report  Result Date: 01/31/2018 Fluoroscopy was utilized by the requesting physician.  No radiographic interpretation.   Ct Renal Stone Study  Result Date: 01/30/2018 CLINICAL DATA:  Acute abdominal flank pain worse on the right EXAM: CT ABDOMEN AND PELVIS WITHOUT CONTRAST TECHNIQUE: Multidetector CT imaging of the abdomen and pelvis was performed following the standard protocol without IV contrast. COMPARISON:  09/04/2017 FINDINGS: Lower chest: Bibasilar atelectasis. Mild cardiomegaly. No pericardial or pleural effusion. Small hiatal hernia. Descending thoracic aorta is tortuous and ectatic with atherosclerosis. Hepatobiliary: No focal liver abnormality is seen. Status post cholecystectomy. No biliary dilatation. Pancreas: Unremarkable. No pancreatic ductal dilatation or surrounding inflammatory changes. Spleen: Normal in size without focal abnormality. Adrenals/Urinary Tract: Normal adrenal glands. Right kidney demonstrates acute perinephric strandy edema/inflammation. There is associated moderate right hydroureteronephrosis secondary to a proximal obstructing right ureteral calculus measuring 6 mm, image 34 series 2. There is a stable 2.5 cm right kidney lower pole hypodense cyst medially. Left kidney and ureter demonstrate no acute process or obstruction. Left ureter is nondilated. Air noted within the bladder dome.  Bladder otherwise unremarkable. Stomach/Bowel: Negative for bowel obstruction, significant dilatation, ileus, or free air. Appendix not visualized. No fluid collection or abscess. Moderate colonic stool burden. Extensive sigmoid diverticulosis without acute inflammatory process. Vascular/Lymphatic: Atherosclerosis of the aorta. 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 Extensive iliac atherosclerosis as well. No retroperitoneal hemorrhage or hematoma. No adenopathy. Reproductive: Remote hysterectomy. No other pelvic abnormality, fluid collection, ascites, or abscess. Other: No abdominal wall hernia or abnormality. No abdominopelvic ascites. Musculoskeletal: Bones are osteopenic. Degenerative changes of the spine. Extensive facet arthropathy. T12 and L5 subacute partially healing compression fractures noted when compared to 07/03/2017. IMPRESSION: Right proximal ureteral 6 mm obstructing calculus with associated right hydroureteronephrosis and perinephric/ureteral inflammation and edema. 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 Remote cholecystectomy, appendectomy and hysterectomy Colonic diverticulosis without acute inflammation or complicating feature Subacute T12 and L5 compression fractures, new since 07/03/2017 Electronically Signed   By: Jerilynn Mages.  Shick M.D.   On: 01/30/2018 22:00   [place] Ekg Site Rite  Result Date: 02/10/2018 If Site Rite image not attached, placement could not be confirmed due to current cardiac rhythm.  [place] Ekg Site Rite  Result Date: 02/02/2018 If Site Rite image not attached, placement could not be confirmed due to current cardiac rhythm.   Orson Eva, DO  [facility] Pager [phone]  If 7PM-7AM, please contact night-coverage [email or web address] Password [email or web address] 02/24/2018, 8:58 AM   LOS: 0 days

## 2018-02-25 ENCOUNTER — Observation Stay (HOSPITAL_COMMUNITY): Payer: Medicare Other

## 2018-02-25 DIAGNOSIS — N390 Urinary tract infection, site not specified: Secondary | ICD-10-CM | POA: Diagnosis not present

## 2018-02-25 DIAGNOSIS — Z1612 Extended spectrum beta lactamase (ESBL) resistance: Secondary | ICD-10-CM | POA: Diagnosis not present

## 2018-02-25 DIAGNOSIS — N1339 Other hydronephrosis: Secondary | ICD-10-CM | POA: Diagnosis not present

## 2018-02-25 DIAGNOSIS — G894 Chronic pain syndrome: Secondary | ICD-10-CM | POA: Diagnosis not present

## 2018-02-25 DIAGNOSIS — I10 Essential (primary) hypertension: Secondary | ICD-10-CM | POA: Diagnosis not present

## 2018-02-25 DIAGNOSIS — B9629 Other Escherichia coli [E. coli] as the cause of diseases classified elsewhere: Secondary | ICD-10-CM | POA: Diagnosis not present

## 2018-02-25 DIAGNOSIS — I482 Chronic atrial fibrillation, unspecified: Secondary | ICD-10-CM | POA: Diagnosis not present

## 2018-02-25 DIAGNOSIS — E782 Mixed hyperlipidemia: Secondary | ICD-10-CM | POA: Diagnosis not present

## 2018-02-25 DIAGNOSIS — I714 Abdominal aortic aneurysm, without rupture: Secondary | ICD-10-CM | POA: Diagnosis not present

## 2018-02-25 DIAGNOSIS — G9340 Encephalopathy, unspecified: Secondary | ICD-10-CM | POA: Diagnosis not present

## 2018-02-25 LAB — BASIC METABOLIC PANEL WITH GFR
Anion gap: 8 (ref 5–15)
BUN: 13 mg/dL (ref 8–23)
CO2: 28 mmol/L (ref 22–32)
Calcium: 8.2 mg/dL — ABNORMAL LOW (ref 8.9–10.3)
Chloride: 100 mmol/L (ref 98–111)
Creatinine, Ser: 0.78 mg/dL (ref 0.44–1.00)
GFR calc Af Amer: >60 mL/min
GFR calc non Af Amer: >60 mL/min
Glucose, Bld: 97 mg/dL (ref 70–99)
Potassium: 4 mmol/L (ref 3.5–5.1)
Sodium: 136 mmol/L (ref 135–145)

## 2018-02-25 LAB — GLUCOSE, CAPILLARY
Glucose-Capillary: 84 mg/dL (ref 70–99)
Glucose-Capillary: 131 mg/dL — ABNORMAL HIGH (ref 70–99)
Glucose-Capillary: 97 mg/dL (ref 70–99)
Glucose-Capillary: 101 mg/dL — ABNORMAL HIGH (ref 70–99)

## 2018-02-25 LAB — CBC
HCT: 32.7 % — ABNORMAL LOW (ref 36.0–46.0)
Hemoglobin: 9.7 g/dL — ABNORMAL LOW (ref 12.0–15.0)
MCH: 27.9 pg (ref 26.0–34.0)
MCHC: 29.7 g/dL — ABNORMAL LOW (ref 30.0–36.0)
MCV: 94 fL (ref 80.0–100.0)
Platelets: 195 10*3/uL (ref 150–400)
RBC: 3.48 MIL/uL — ABNORMAL LOW (ref 3.87–5.11)
RDW: 13.7 % (ref 11.5–15.5)
WBC: 6.1 10*3/uL (ref 4.0–10.5)
nRBC: 0 % (ref 0.0–0.2)

## 2018-02-25 LAB — URIC ACID: Uric Acid, Serum: 2.8 mg/dL (ref 2.5–7.1)

## 2018-02-25 LAB — PROCALCITONIN: Procalcitonin: <0.1 ng/mL

## 2018-02-25 MED ORDER — METOPROLOL SUCCINATE ER 25 MG PO TB24
12.5000 mg | ORAL_TABLET | Freq: Once | ORAL | Status: DC
  Filled 2018-02-25: qty 1

## 2018-02-25 MED ORDER — METOPROLOL SUCCINATE ER 25 MG PO TB24
25.0000 mg | ORAL_TABLET | Freq: Every day | ORAL | Status: DC
  Administered 2018-02-26: 25 mg via ORAL
  Filled 2018-02-25: qty 1

## 2018-02-25 NOTE — Progress Notes (Signed)
PROGRESS NOTE  [name] JJH:[id number] DOB: Aug 23, 1933 DOA: 02/23/2018 PCP: [name], MD  Brief History:  84 year old female with a history of dementia, chronic atrial fibrillation, impaired glucose tolerance, spinal stenosis, hypertension, hyperlipidemia presented after an unwitnessed fall from her nursing facility, [facility].  This represents the patient's fourth hospital admission since January 30, 2018.  Apparently, there was some concern the patient may have been confused.  As result, the patient was brought to emergency department for further evaluation.  Unfortunately, the patient is unable to provide a significant history secondary to her cognitive impairment.  On her part, the patient denies any chest pain, shortness breath, nausea no vomiting, diarrhea, abdominal pain, dysuria.  However she complains of some pain in her forehead where she fell and hit her head on the evening of 02/23/2018.  The patient was most recently admitted to the hospital from 02/09/2018 through 02/12/2018 for acute metabolic encephalopathy.  It was thought to been due to her continued infectious process.  In addition, the patient was initially admitted from 01/30/2018 through 02/03/2018.  During that admission, the patient was noted to have an infected right ureteral stone.  She underwent cystoureteroscopy with placement of double-J stent in the right ureter by Dr. Louis Meckel.  Cultures and urine cultures grew ESBL E. coli.  The patient was discharged back to nursing facility with a PICC line to finish ertapenem with anticipated stop date 02/14/2018.  However, on her most recent admission, the patient's PICC line was noted to be displaced.  It was removed on 02/10/2018.  The patient's family deferred placement of another PICC line.  However, from the documentation, it is unclear what type of IV access, if any, was placed to finish off the patient's course of ertapenem at the time of discharge on  02/12/2018.  Because of the the patient's fall and forehead laceration and concerns for confusion, the patient was admitted for further evaluation.  The patient has remained afebrile hemodynamically stable saturating 90% on room air.  Patient was noted to be mildly hypertensive in the emergency department and fluid resuscitated with improvement of her blood pressure.  Lactic acid was 0.79.  BMP and LFTs were unremarkable.  WBC was 10.5 at the time of admission.  Assessment/Plan: Pyelonephritis--GNR/ Infected renal stone -UA with greater than 50 WBC -likley to be same ESBL EColi sensitive to only IV abx -Continue meropenem pending urine culture data -high risk of sepsis and recurrent infection if d/c with oral abx -in the setting of recent JJ-stent placement from ureteral stone-->CT abd -10/26 CT abd--4 mm upper pole calculus seen on the prior CT scan is now in the right upper ureter adjacent to the double-J ureteral stent at the L2-3 level.  Mild persistent right-sided hydronephrosis. -case discussed with urology, Dr. Oneida Arenas foley -10/27 renal US--no hydronephrosis  Acute metabolic encephalopathy -Likely multifactorial including the UTI, opioids, dehydration and possible sundowning in the setting of underlying dementia. -02/24/2018, the patient's mental status improved to baseline per -02/10/2018 serum B12-297, ammonia 15, TSH 3.641, RPR nonreactive, RBC folate 1338  Hypotension -lactic acid 0.79 -improved with IVF  Nephrolithiasis with ureteral obstruction status post stent -01/31/18--right ureteral stent placed, Dr. Louis Meckel -will need outpatient follow-up for stent removal -02/11/2018 CT abdomen and pelvis--persistent mild right pelvic caliectasis with hyperenhancement of the right ureteral collecting system and ureteral urothelium; interval placement of right double-J stent  Mechanical fall with forehead laceration -The patient is not  a candidate for  anticoagulation secondary to her numerous falls -Steri-Strips were placed in the emergency department -Local wound care  Chronic atrial fibrillation -Rate controlled -Gradually increase metoprolol succinate dose -CHADSVASc = 4 (HTN, Age, female) -not a candidate for [facility] due to multiple falls  Chronic pain syndrome -Continue home dose of gabapentin and formulary equivalent for Opana  Hyperlipidemia -Continue Crestor  [name] -02/11/18 CT abd = 3.9 cm -outpt surveillance  Left knee pain -10/26 xray--neg for fracture or dislocation -secondary to DJD and recent fall -uric acid 206   Disposition Plan:  SNF 10/27 or 10/28 Family Communication: Daughter updated at bedside 10/27  Consultants:  none  Code Status:  DNR  DVT Prophylaxis:   [place] Lovenox   Procedures: As Listed in Progress Note Above  Antibiotics: Merrem 10/25>>>   Subjective: Pt is feeling better.  Patient denies fevers, chills, headache, chest pain, dyspnea, nausea, vomiting, diarrhea, abdominal pain, dysuria, hematuria.  C/o left knee pain   Objective: Vitals:   02/24/18 2105 02/25/18 0500 02/25/18 0619 02/25/18 0634  BP: 118/69  109/64   Pulse: (!) 113  97 99  Resp: 18  18   Temp: 98.4 F (36.9 C)  98.2 F (36.8 C)   TempSrc: Oral  Oral   SpO2: 93%  (!) 87% 97%  Weight:  73.3 kg    Height:        Intake/Output Summary (Last 24 hours) at 02/25/2018 1303 Last data filed at 02/25/2018 0900 Gross per 24 hour  Intake 1095 ml  Output 2425 ml  Net -1330 ml   Weight change: -3.812 kg Exam:   General:  Pt is alert, follows commands appropriately, not in acute distress  HEENT: No icterus, No thrush, No neck mass, [place]/AT  Cardiovascular: RRR, S1/S2, no rubs, no gallops  Respiratory: bibasilar crackles; no wheeze  Abdomen: Soft/+BS, non tender, non distended, no guarding  Extremities: No edema, No lymphangitis, No petechiae, No rashes, no synovitis.  + left knee effusion without  erythema or warmth   Data Reviewed: I have personally reviewed following labs and imaging studies Basic Metabolic Panel: Recent Labs  Lab 02/23/18 1536 02/24/18 0310 02/25/18 0656  NA 137 140 136  K 4.3 3.8 4.0  CL 101 105 100  CO2 29 28 28   GLUCOSE 119* 99 97  BUN 22 18 13   CREATININE 0.86 0.88 0.78  CALCIUM 8.6* 8.3* 8.2*   Liver Function Tests: Recent Labs  Lab 02/24/18 0310  AST 10*  ALT 7  ALKPHOS 57  BILITOT 1.1  PROT 6.1*  ALBUMIN 2.5*   No results for input(s): LIPASE, AMYLASE in the last 168 hours. No results for input(s): AMMONIA in the last 168 hours. Coagulation Profile: No results for input(s): INR, PROTIME in the last 168 hours. CBC: Recent Labs  Lab 02/23/18 1536 02/24/18 0310 02/25/18 0656  WBC 10.5 7.4 6.1  NEUTROABS 7.5  --   --   HGB 10.9* 9.8* 9.7*  HCT 36.2 33.1* 32.7*  MCV 92.6 94.6 94.0  PLT 251 208 195   Cardiac Enzymes: Recent Labs  Lab 02/23/18 1956 02/24/18 0310 02/24/18 0900  TROPONINI <0.03 <0.03 <0.03   BNP: Invalid input(s): POCBNP CBG: Recent Labs  Lab 02/24/18 1133 02/24/18 1633 02/24/18 2237 02/25/18 0725 02/25/18 1127  GLUCAP 73 110* 107* 84 131*   HbA1C: Recent Labs    02/24/18 0310  HGBA1C 5.7*   Urine analysis:    Component Value Date/Time   COLORURINE AMBER (A) 02/23/2018 1846  APPEARANCEUR CLOUDY (A) 02/23/2018 1846   LABSPEC 1.018 02/23/2018 1846   PHURINE 6.0 02/23/2018 1846   GLUCOSEU NEGATIVE 02/23/2018 1846   GLUCOSEU NEG mg/dL 03/08/2007 2031   HGBUR MODERATE (A) 02/23/2018 1846   HGBUR negative 03/06/2008 0846   BILIRUBINUR NEGATIVE 02/23/2018 1846   KETONESUR NEGATIVE 02/23/2018 1846   PROTEINUR 100 (A) 02/23/2018 1846   UROBILINOGEN 0.2 09/20/2013 1535   NITRITE POSITIVE (A) 02/23/2018 1846   LEUKOCYTESUR LARGE (A) 02/23/2018 1846   Sepsis Labs: @LABRCNTIP (procalcitonin:4,lacticidven:4) ) Recent Results (from the past 240 hour(s))  Urine culture     Status: Abnormal  (Preliminary result)   Collection Time: 02/23/18  6:46 PM  Result Value Ref Range Status   Specimen Description   Final    URINE, CATHETERIZED Performed at [facility], [address] [place], [place] [address]    Special Requests   Final    NONE Performed at [facility], [address] [place], [place] [address]    Culture (A)  Final    >=100,000 COLONIES/mL GRAM NEGATIVE RODS CULTURE REINCUBATED FOR BETTER GROWTH Performed at [facility], [place] [address] [place], [place] [address]    Report Status PENDING  Incomplete  MRSA PCR Screening     Status: None   Collection Time: 02/23/18  9:30 PM  Result Value Ref Range Status   MRSA by PCR NEGATIVE NEGATIVE Final    Comment:        The GeneXpert MRSA Assay (FDA approved for NASAL specimens only), is one component of a comprehensive MRSA colonization surveillance program. It is not intended to diagnose MRSA infection nor to guide or monitor treatment for MRSA infections. Performed at [facility], [address], [place], [place] [address]      Scheduled Meds: . aspirin EC  81 mg Oral Daily  . cholecalciferol  2,000 Units Oral Daily  . DULoxetine  90 mg Oral Daily  . enoxaparin (LOVENOX) injection  40 mg Subcutaneous Q24H  . gabapentin  200 mg Oral QHS  . insulin aspart  0-5 Units Subcutaneous QHS  . insulin aspart  0-9 Units Subcutaneous TID WC  . metoprolol succinate  12.5 mg Oral Daily  . morphine  15 mg Oral Q8H  . QUEtiapine  25 mg Oral QHS  . rosuvastatin  40 mg Oral QPM   Continuous Infusions: . meropenem (MERREM) IV Stopped (02/25/18 0086)    Procedures/Studies: Ct Abdomen Pelvis W Wo Contrast  Result Date: 02/11/2018 CLINICAL DATA:  Patient with sepsis.  Pyelonephritis. EXAM: CT ABDOMEN AND PELVIS WITHOUT AND WITH CONTRAST TECHNIQUE: Multidetector CT imaging of the abdomen and pelvis was performed following the standard protocol before and following the bolus administration of intravenous contrast.  CONTRAST:  111mL ISOVUE-300 IOPAMIDOL (ISOVUE-300) INJECTION 61% COMPARISON:  CT abdomen pelvis 01/30/2018 FINDINGS: Lower chest: Heart is enlarged. Coronary arterial vascular calcifications. Moderate layering bilateral pleural effusions. Subpleural consolidation within the lower lobes bilaterally favored to represent atelectasis. Hepatobiliary: The liver is normal in size and contour. No focal hepatic lesions identified. Gallbladder is surgically absent. Pancreas: Unremarkable Spleen: Unremarkable Adrenals/Urinary Tract: Normal adrenal glands. Noncontrast images demonstrate a 4 mm stone within the superior aspect of the right renal collecting system (image 26; series 2). Mild right pelviectasis. Right double-J nephroureteral stent is in place. Kidneys enhance symmetrically with contrast. There is right renal collecting system and ureteral urothelial thickening and hyperenhancement. Urinary bladder is decompressed with a Foley catheter. Small amount of gas within the urinary bladder. Stomach/Bowel: Sigmoid  colonic diverticulosis. No CT evidence for acute diverticulitis. No evidence for small bowel obstruction. No free fluid or free intraperitoneal air. Normal morphology of the stomach. Vascular/Lymphatic: Tortuous dilated abdominal aorta measuring up to 3.9 cm (image 29; series 5). Peripheral calcified atherosclerotic plaque. Atherosclerotic narrowing of the proximal aspect of the celiac axis and superior mesenteric artery. No retroperitoneal lymphadenopathy. Reproductive: Status post hysterectomy. Other: None. Musculoskeletal: Similar-appearing T12 and L5 subacute compression deformities. IMPRESSION: 1. Interval insertion of right-sided double-J nephroureteral stent. Persistent mild right pelviectasis. There is hyperenhancement and thickening of the right renal collecting system and ureteral urothelium which may be reactive in etiology. Superimposed infection not excluded. 2. 4 mm stone superior pole right renal  collecting system. 3. Abdominal aorta measures 3.9 cm. Recommend followup by ultrasound in 2 years. This recommendation follows ACR consensus guidelines: White Paper of the ACR Incidental Findings Committee II on Vascular Findings. J Am Coll Radiol 2013; 10:789-794. 4. Sigmoid colonic diverticulosis without evidence for acute diverticulitis. 5. T12 and L5 subacute compression deformities. Electronically Signed   By: Lovey Newcomer M.D.   On: 02/11/2018 16:32   Ct Abdomen Pelvis Wo Contrast  Result Date: 02/24/2018 CLINICAL DATA:  Pyelonephritis. EXAM: CT ABDOMEN AND PELVIS WITHOUT CONTRAST TECHNIQUE: Multidetector CT imaging of the abdomen and pelvis was performed following the standard protocol without IV contrast. COMPARISON:  CT scan 02/11/2018 FINDINGS: Lower chest: Persistent bilateral pleural effusions and overlying atelectasis. The heart is mildly enlarged but stable. Stable extensive aortic and coronary artery calcifications. Hepatobiliary: No focal hepatic lesions or intrahepatic biliary dilatation. The gallbladder surgically absent. Mild associated common bile duct dilatation. Pancreas: Moderate atrophy. No mass, inflammation or ductal dilatation. Spleen: Normal size.  No focal lesions. Adrenals/Urinary Tract: The adrenal glands are normal in stable. Double-J ureteral stent in place on the right side. Mild residual hydronephrosis. The right renal calculus is no longer identified. It is now in the right ureter adjacent to the stent best seen on image number 42 of series 2 and image number 57 of series 5. No left-sided hydroureteronephrosis, renal or ureteral calculi. No bladder calculi. Stomach/Bowel: The stomach, duodenum, small bowel and colon are grossly normal without oral contrast. No inflammatory changes, mass lesions or obstructive findings. The terminal ileum and appendix are normal. Marked sigmoid colon diverticulosis without findings for acute diverticulitis. Vascular/Lymphatic: Severe  atherosclerotic calcifications involving the aorta and branch vessels with stable areas of aneurysmal dilatation. No mesenteric or retroperitoneal mass or adenopathy. Reproductive: The uterus is surgically absent. Both ovaries are still present and appear normal. Other: No pelvic mass or free pelvic fluid collections. No inguinal mass or adenopathy. Musculoskeletal: Stable T12 compression fracture and mild L1 fracture and mild sclerosis. IMPRESSION: 1. The 4 mm upper pole calculus seen on the prior CT scan is now in the right upper ureter adjacent to the double-J ureteral stent at the L2-3 level. 2. Mild persistent right-sided hydronephrosis. 3. No acute abdominal findings otherwise. 4. Persistent small bilateral pleural effusions and overlying atelectasis. 5. Severe atherosclerotic disease. Electronically Signed   By: Marijo Sanes M.D.   On: 02/24/2018 12:04   Dg Chest 2 View  Result Date: 02/23/2018 CLINICAL DATA:  84 year old with hypoxia. EXAM: CHEST - 2 VIEW COMPARISON:  02/10/2018 FINDINGS: Stable enlargement of the cardiac silhouette. Atherosclerotic calcifications at the aortic arch. Densities at both lung bases are compatible with small pleural effusions and atelectasis/consolidation. Negative for pulmonary edema. Right arm PICC line has been removed. IMPRESSION: Persistent basilar chest densities, left side greater  than right. Findings are suggestive for small effusions with atelectasis/consolidation. Minimal change from the previous examination. Stable cardiomegaly. Aortic atherosclerosis. Electronically Signed   By: Markus Daft M.D.   On: 02/23/2018 17:43   Dg Chest 2 View  Result Date: 02/09/2018 CLINICAL DATA:  Mental status change.  Shortness of breath. EXAM: CHEST - 2 VIEW COMPARISON:  Chest x-rays dated 02/05/2018 and 02/03/2018. FINDINGS: Stable cardiomegaly. Stable small bilateral pleural effusions. Probable associated mild edema within the RIGHT lower lobe. No pneumothorax seen.  RIGHT-sided PICC line is incompletely visualized, tip directed into the lower RIGHT neck. IMPRESSION: 1. PICC line has been displaced since the previous chest x-ray, with tip now directed upwards in the RIGHT neck (tip presumably in the RIGHT internal jugular vein). Recommend repositioning. 2. Stable small bilateral pleural effusions. Probable associated mild edema within the RIGHT lower lobe. 3. Stable cardiomegaly. Electronically Signed   By: Franki Cabot M.D.   On: 02/09/2018 17:45   Dg Knee 1-2 Views Left  Result Date: 02/24/2018 CLINICAL DATA:  Left knee pain following a fall. EXAM: LEFT KNEE - 1-2 VIEW COMPARISON:  02/03/2018. FINDINGS: Marked lateral joint space narrowing with progression. Extensive tricompartmental spur formation. Moderate-sized effusion. Extensive atheromatous arterial calcifications. No fracture or dislocation seen. IMPRESSION: 1. No fracture or dislocation. 2. Extensive tricompartmental degenerative changes with progression. 3. Interval moderate sized effusion. Electronically Signed   By: Claudie Revering M.D.   On: 02/24/2018 17:00   Ct Head Wo Contrast  Result Date: 02/23/2018 CLINICAL DATA:  Pt states she fell onto her forehead. EXAM: CT HEAD WITHOUT CONTRAST CT CERVICAL SPINE WITHOUT CONTRAST TECHNIQUE: Multidetector CT imaging of the head and cervical spine was performed following the standard protocol without intravenous contrast. Multiplanar CT image reconstructions of the cervical spine were also generated. COMPARISON:  None. FINDINGS: CT HEAD FINDINGS Brain: There is significant central cortical atrophy. Periventricular white matter changes are present. There is no intra or extra-axial fluid collection or mass lesion. The basilar cisterns and ventricles have a normal appearance. There is no CT evidence for acute infarction or hemorrhage. Vascular: There is dense atherosclerotic calcification of the internal carotid arteries. No hyperdense vessels. Skull: Normal. Negative  for fracture or focal lesion. Sinuses/Orbits: No acute finding. Other: Partially imaged LEFT frontal scalp edema and laceration. No underlying fracture. CT CERVICAL SPINE FINDINGS Alignment: Vertebral bodies are normally aligned with preservation of the normal cervical lordosis.There is degenerative type anterolisthesis of C4 on C5 measuring 2 millimeters. Skull base and vertebrae: No acute fracture. No primary bone lesion or focal pathologic process. Soft tissues and spinal canal: No prevertebral fluid or swelling. No visible canal hematoma. There is extensive atherosclerotic calcification of the carotid arteries. Disc levels:  Mild disc height loss throughout the cervical spine. Upper chest: There is a LEFT pleural effusion. A partially imaged thoracic aorta shows significant atherosclerotic calcification. Other: None IMPRESSION: 1.  No evidence for acute intracranial abnormality. 2. Atrophy and small vessel disease. 3. LEFT frontal scalp laceration and edema. 4. Degenerative changes in the cervical spine without evidence for acute cervical spine fracture. 5. Significant atherosclerotic calcification of the carotid arteries. 6. LEFT pleural effusion. Recommend further evaluation with two-view chest x-ray. 7.  Aortic atherosclerosis.  (ICD10-I70.0) Electronically Signed   By: Nolon Nations M.D.   On: 02/23/2018 16:30   Ct Head Wo Contrast  Result Date: 02/09/2018 CLINICAL DATA:  Confusion and delirium with altered level of consciousness. EXAM: CT HEAD WITHOUT CONTRAST TECHNIQUE: Contiguous axial images were obtained  from the base of the skull through the vertex without intravenous contrast. COMPARISON:  CT exams dating back to 07/03/2017 FINDINGS: Brain: Atrophy with chronic moderate small vessel ischemia. General calcifications and along the right parietal lobe. Remote right pontine lacunar infarct. No large vascular territory infarction, hemorrhage or midline shift. No intra-axial mass nor extra-axial  fluid. Midline fourth ventricle and basal cisterns. Vascular: Vascular calcifications along both vertebral and cavernous internal carotid arteries. No hyperdense vessel sign. Skull: No acute skull fracture. Sinuses/Orbits: Bilateral lens replacements. The paranasal sinuses mastoids. Other: Dystrophic intramuscular calcifications redemonstrated along the right infratemporal fossa. IMPRESSION: 1. Atrophy with chronic moderate small vessel ischemia. Chronic right pontine lacunar infarct. 2. No acute intracranial abnormality. Electronically Signed   By: Ashley Royalty M.D.   On: 02/09/2018 20:06   Ct Angio Chest Pe W And/or Wo Contrast  Result Date: 02/04/2018 CLINICAL DATA:  Complex chest pain, intermediate clinical probability for pulmonary embolism, hypoxia, history atrial fibrillation, dementia, diabetes mellitus, hypertension EXAM: CT ANGIOGRAPHY CHEST WITH CONTRAST TECHNIQUE: Multidetector CT imaging of the chest was performed using the standard protocol during bolus administration of intravenous contrast. Multiplanar CT image reconstructions and MIPs were obtained to evaluate the vascular anatomy. CONTRAST:  143mL ISOVUE-370 IOPAMIDOL (ISOVUE-370) INJECTION 76% IV COMPARISON:  09/04/2017 FINDINGS: Cardiovascular: Extensive atherosclerotic calcifications aorta, proximal great vessels and coronary arteries. Ascending thoracic aorta upper normal caliber. Pulmonary arteries well opacified and patent. No evidence of pulmonary embolism. Enlargement of cardiac chambers. No pericardial effusion. Mediastinum/Nodes: Esophagus unremarkable. Base of cervical region normal appearance. No thoracic adenopathy. Lungs/Pleura: BILATERAL pleural effusions with atelectasis of the posterior lower lobes bilaterally. Remaining lungs clear though scattered respiratory motion artifacts are present. Upper Abdomen: Last image demonstrates ectasia of the abdominal aorta, noted on the 09/14/2017 exam, inadequately assessed on this study.  Remaining visualized upper abdomen unremarkable. Musculoskeletal: Bones demineralized. Marked compression fracture of T12 vertebral body increased since previous exam. Review of the MIP images confirms the above findings. IMPRESSION: No evidence of pulmonary embolism. Extensive atherosclerotic calcifications of aorta, proximal great vessels and coronary arteries. Small BILATERAL pleural effusions and bibasilar atelectasis. Increased compression fracture of T12 vertebral body. Aortic Atherosclerosis (ICD10-I70.0). Electronically Signed   By: Lavonia Dana M.D.   On: 02/04/2018 13:16   Ct Cervical Spine Wo Contrast  Result Date: 02/23/2018 CLINICAL DATA:  Pt states she fell onto her forehead. EXAM: CT HEAD WITHOUT CONTRAST CT CERVICAL SPINE WITHOUT CONTRAST TECHNIQUE: Multidetector CT imaging of the head and cervical spine was performed following the standard protocol without intravenous contrast. Multiplanar CT image reconstructions of the cervical spine were also generated. COMPARISON:  None. FINDINGS: CT HEAD FINDINGS Brain: There is significant central cortical atrophy. Periventricular white matter changes are present. There is no intra or extra-axial fluid collection or mass lesion. The basilar cisterns and ventricles have a normal appearance. There is no CT evidence for acute infarction or hemorrhage. Vascular: There is dense atherosclerotic calcification of the internal carotid arteries. No hyperdense vessels. Skull: Normal. Negative for fracture or focal lesion. Sinuses/Orbits: No acute finding. Other: Partially imaged LEFT frontal scalp edema and laceration. No underlying fracture. CT CERVICAL SPINE FINDINGS Alignment: Vertebral bodies are normally aligned with preservation of the normal cervical lordosis.There is degenerative type anterolisthesis of C4 on C5 measuring 2 millimeters. Skull base and vertebrae: No acute fracture. No primary bone lesion or focal pathologic process. Soft tissues and spinal  canal: No prevertebral fluid or swelling. No visible canal hematoma. There is extensive atherosclerotic calcification of the  carotid arteries. Disc levels:  Mild disc height loss throughout the cervical spine. Upper chest: There is a LEFT pleural effusion. A partially imaged thoracic aorta shows significant atherosclerotic calcification. Other: None IMPRESSION: 1.  No evidence for acute intracranial abnormality. 2. Atrophy and small vessel disease. 3. LEFT frontal scalp laceration and edema. 4. Degenerative changes in the cervical spine without evidence for acute cervical spine fracture. 5. Significant atherosclerotic calcification of the carotid arteries. 6. LEFT pleural effusion. Recommend further evaluation with two-view chest x-ray. 7.  Aortic atherosclerosis.  (ICD10-I70.0) Electronically Signed   By: Nolon Nations M.D.   On: 02/23/2018 16:30   US Renal  Result Date: 02/25/2018 CLINICAL DATA:  Followup for right hydronephrosis. EXAM: RENAL / URINARY TRACT ULTRASOUND COMPLETE COMPARISON:  CT, 02/24/2018. FINDINGS: Right Kidney: Length: 10.2 cm. Normal parenchymal echogenicity. The ureteral stent noted on the previous day's CT is not visualized. There is no hydronephrosis. 2.3 x 3.0 x 3.3 cm cyst arises from the lower pole mildly increased in size from a renal ultrasound dated 07/03/2017. No other renal masses. No visualized stones. Left Kidney: Length: 10.5 cm. Echogenicity within normal limits. No mass or hydronephrosis visualized. Bladder: Not visualized, decompressed with a Foley catheter. IMPRESSION: 1. No acute findings.  No hydronephrosis. 2. 3.3 cm lower pole right renal cyst. Electronically Signed   By: Lajean Manes M.D.   On: 02/25/2018 12:06   Dg Chest Port 1 View  Result Date: 02/10/2018 CLINICAL DATA:  Right peripheral picc placement EXAM: PORTABLE CHEST 1 VIEW COMPARISON:  Chest x-ray dated 02/09/2018. FINDINGS: RIGHT-sided PICC line tip is still directed into the RIGHT lower neck,  presumably within the internal jugular vein. Stable cardiomegaly. Overall cardiomediastinal silhouette is peers stable. Persistent opacity at the LEFT lung base, likely small pleural effusion and/or atelectasis. No new lung findings. No pneumothorax seen. IMPRESSION: 1. RIGHT-sided PICC line is again directed into the lower RIGHT neck with tip presumably within the internal jugular vein. 2. Persistent opacity at the LEFT lung base, compatible with the small LEFT pleural effusion and associated atelectasis demonstrated on earlier chest CT of 02/04/2018. Probable small residual pleural effusion at the RIGHT lung base. No new lung findings. No pneumothorax. These results will be called to the ordering clinician or representative by the Radiologist Assistant, and communication documented in the PACS or zVision Dashboard. Electronically Signed   By: Franki Cabot M.D.   On: 02/10/2018 11:30   Dg Chest Port 1 View  Result Date: 02/05/2018 CLINICAL DATA:  Hypoxia. EXAM: PORTABLE CHEST 1 VIEW COMPARISON:  Chest CT yesterday. FINDINGS: Right upper extremity PICC tip in the proximal SVC. Again seen cardiomegaly with tortuous atherosclerotic thoracic aorta. Bilateral pleural effusions, unchanged of the left but worsened on the right. Slight vascular congestion. No pulmonary edema or pneumothorax. IMPRESSION: 1. Bilateral pleural effusions, increasing on the right and unchanged on the left. 2. Cardiomegaly with slight vascular congestion. Atherosclerotic tortuous thoracic aorta. Electronically Signed   By: Keith Rake M.D.   On: 02/05/2018 05:21   Dg Chest Port 1 View  Result Date: 02/03/2018 CLINICAL DATA:  Low O2 sats. EXAM: PORTABLE CHEST 1 VIEW COMPARISON:  February 02, 2018 FINDINGS: There is a left-sided pleural effusion with underlying opacity, similar in the interval. The probable tiny layering effusion on the right seen yesterday is not definitely seen today. A right PICC line is in good position. No  pneumothorax. Stable cardiomegaly. No other change. IMPRESSION: 1. The right PICC line is in good position. 2.  Left-sided pleural effusion with underlying opacity, not significantly changed. Electronically Signed   By: Dorise Bullion III M.D   On: 02/03/2018 19:31   Dg Chest Port 1 View  Result Date: 02/02/2018 CLINICAL DATA:  PICC line placement evaluation. EXAM: PORTABLE CHEST 1 VIEW COMPARISON:  Sep 04, 2017 FINDINGS: A right PICC line terminates in the central SVC. Stable cardiomegaly. Small bilateral effusions are new. No other interval changes or acute abnormalities. IMPRESSION: 1. The right PICC line terminates in the central SVC. 2. Small bilateral effusions with underlying atelectasis, new in the interval. Electronically Signed   By: Dorise Bullion III M.D   On: 02/02/2018 21:27   Dg Knee Complete 4 Views Left  Result Date: 02/03/2018 CLINICAL DATA:  Left knee pain today. EXAM: LEFT KNEE - COMPLETE 4+ VIEW COMPARISON:  Remote radiographs 12/16/2005 FINDINGS: No fracture or dislocation. Tricompartmental osteoarthritis with peripheral spurring, progressed from remote prior exam. Lateral tibiofemoral joint space narrowing. There is tibiofemoral chondrocalcinosis. Small joint effusion. Advanced vascular calcifications. IMPRESSION: Moderate to advanced tricompartmental osteoarthritis without acute osseous abnormality. Electronically Signed   By: Keith Rake M.D.   On: 02/03/2018 23:27   Dg C-arm 1-60 Min-no Report  Result Date: 01/31/2018 Fluoroscopy was utilized by the requesting physician.  No radiographic interpretation.   Ct Renal Stone Study  Result Date: 01/30/2018 CLINICAL DATA:  Acute abdominal flank pain worse on the right EXAM: CT ABDOMEN AND PELVIS WITHOUT CONTRAST TECHNIQUE: Multidetector CT imaging of the abdomen and pelvis was performed following the standard protocol without IV contrast. COMPARISON:  09/04/2017 FINDINGS: Lower chest: Bibasilar atelectasis. Mild cardiomegaly.  No pericardial or pleural effusion. Small hiatal hernia. Descending thoracic aorta is tortuous and ectatic with atherosclerosis. Hepatobiliary: No focal liver abnormality is seen. Status post cholecystectomy. No biliary dilatation. Pancreas: Unremarkable. No pancreatic ductal dilatation or surrounding inflammatory changes. Spleen: Normal in size without focal abnormality. Adrenals/Urinary Tract: Normal adrenal glands. Right kidney demonstrates acute perinephric strandy edema/inflammation. There is associated moderate right hydroureteronephrosis secondary to a proximal obstructing right ureteral calculus measuring 6 mm, image 34 series 2. There is a stable 2.5 cm right kidney lower pole hypodense cyst medially. Left kidney and ureter demonstrate no acute process or obstruction. Left ureter is nondilated. Air noted within the bladder dome.  Bladder otherwise unremarkable. Stomach/Bowel: Negative for bowel obstruction, significant dilatation, ileus, or free air. Appendix not visualized. No fluid collection or abscess. Moderate colonic stool burden. Extensive sigmoid diverticulosis without acute inflammatory process. Vascular/Lymphatic: Atherosclerosis of the aorta. Infrarenal aorta has aneurysm dilatation measuring 3.9 cm. Extensive iliac atherosclerosis as well. No retroperitoneal hemorrhage or hematoma. No adenopathy. Reproductive: Remote hysterectomy. No other pelvic abnormality, fluid collection, ascites, or abscess. Other: No abdominal wall hernia or abnormality. No abdominopelvic ascites. Musculoskeletal: Bones are osteopenic. Degenerative changes of the spine. Extensive facet arthropathy. T12 and L5 subacute partially healing compression fractures noted when compared to 07/03/2017. IMPRESSION: Right proximal ureteral 6 mm obstructing calculus with associated right hydroureteronephrosis and perinephric/ureteral inflammation and edema. Extensive abdominopelvic atherosclerosis with an infrarenal aneurysm measuring  up to 3.9 cm proximally. Remote cholecystectomy, appendectomy and hysterectomy Colonic diverticulosis without acute inflammation or complicating feature Subacute T12 and L5 compression fractures, new since 07/03/2017 Electronically Signed   By: Jerilynn Mages.  Shick M.D.   On: 01/30/2018 22:00   [place] Ekg Site Rite  Result Date: 02/10/2018 If Site Rite image not attached, placement could not be confirmed due to current cardiac rhythm.  [place] Ekg Site Rite  Result Date: 02/02/2018 If Site  Rite image not attached, placement could not be confirmed due to current cardiac rhythm.   Orson Eva, DO  [facility] Pager [phone]  If 7PM-7AM, please contact night-coverage [email or web address] Password [email or web address] 02/25/2018, 1:03 PM   LOS: 0 days

## 2018-02-26 DIAGNOSIS — M1710 Unilateral primary osteoarthritis, unspecified knee: Secondary | ICD-10-CM | POA: Diagnosis present

## 2018-02-26 DIAGNOSIS — Z9049 Acquired absence of other specified parts of digestive tract: Secondary | ICD-10-CM | POA: Diagnosis not present

## 2018-02-26 DIAGNOSIS — I480 Paroxysmal atrial fibrillation: Secondary | ICD-10-CM | POA: Diagnosis not present

## 2018-02-26 DIAGNOSIS — N1330 Unspecified hydronephrosis: Secondary | ICD-10-CM | POA: Diagnosis not present

## 2018-02-26 DIAGNOSIS — G4733 Obstructive sleep apnea (adult) (pediatric): Secondary | ICD-10-CM | POA: Diagnosis not present

## 2018-02-26 DIAGNOSIS — N136 Pyonephrosis: Secondary | ICD-10-CM | POA: Diagnosis present

## 2018-02-26 DIAGNOSIS — E785 Hyperlipidemia, unspecified: Secondary | ICD-10-CM | POA: Diagnosis not present

## 2018-02-26 DIAGNOSIS — I714 Abdominal aortic aneurysm, without rupture: Secondary | ICD-10-CM | POA: Diagnosis not present

## 2018-02-26 DIAGNOSIS — I6381 Other cerebral infarction due to occlusion or stenosis of small artery: Secondary | ICD-10-CM | POA: Diagnosis not present

## 2018-02-26 DIAGNOSIS — J9811 Atelectasis: Secondary | ICD-10-CM | POA: Diagnosis present

## 2018-02-26 DIAGNOSIS — F0390 Unspecified dementia without behavioral disturbance: Secondary | ICD-10-CM | POA: Diagnosis not present

## 2018-02-26 DIAGNOSIS — N10 Acute tubulo-interstitial nephritis: Secondary | ICD-10-CM | POA: Diagnosis not present

## 2018-02-26 DIAGNOSIS — Z96651 Presence of right artificial knee joint: Secondary | ICD-10-CM | POA: Diagnosis not present

## 2018-02-26 DIAGNOSIS — F0151 Vascular dementia with behavioral disturbance: Secondary | ICD-10-CM | POA: Diagnosis not present

## 2018-02-26 DIAGNOSIS — Z833 Family history of diabetes mellitus: Secondary | ICD-10-CM | POA: Diagnosis not present

## 2018-02-26 DIAGNOSIS — W19XXXD Unspecified fall, subsequent encounter: Secondary | ICD-10-CM | POA: Diagnosis not present

## 2018-02-26 DIAGNOSIS — I4891 Unspecified atrial fibrillation: Secondary | ICD-10-CM | POA: Diagnosis not present

## 2018-02-26 DIAGNOSIS — N390 Urinary tract infection, site not specified: Secondary | ICD-10-CM | POA: Diagnosis not present

## 2018-02-26 DIAGNOSIS — S01112A Laceration without foreign body of left eyelid and periocular area, initial encounter: Secondary | ICD-10-CM | POA: Diagnosis present

## 2018-02-26 DIAGNOSIS — I482 Chronic atrial fibrillation, unspecified: Secondary | ICD-10-CM | POA: Diagnosis not present

## 2018-02-26 DIAGNOSIS — G9340 Encephalopathy, unspecified: Secondary | ICD-10-CM | POA: Diagnosis not present

## 2018-02-26 DIAGNOSIS — K5730 Diverticulosis of large intestine without perforation or abscess without bleeding: Secondary | ICD-10-CM | POA: Diagnosis not present

## 2018-02-26 DIAGNOSIS — Z79899 Other long term (current) drug therapy: Secondary | ICD-10-CM | POA: Diagnosis not present

## 2018-02-26 DIAGNOSIS — R0902 Hypoxemia: Secondary | ICD-10-CM | POA: Diagnosis not present

## 2018-02-26 DIAGNOSIS — N201 Calculus of ureter: Secondary | ICD-10-CM | POA: Diagnosis not present

## 2018-02-26 DIAGNOSIS — G9009 Other idiopathic peripheral autonomic neuropathy: Secondary | ICD-10-CM | POA: Diagnosis present

## 2018-02-26 DIAGNOSIS — M4690 Unspecified inflammatory spondylopathy, site unspecified: Secondary | ICD-10-CM | POA: Diagnosis present

## 2018-02-26 DIAGNOSIS — N200 Calculus of kidney: Secondary | ICD-10-CM | POA: Diagnosis not present

## 2018-02-26 DIAGNOSIS — Z7982 Long term (current) use of aspirin: Secondary | ICD-10-CM | POA: Diagnosis not present

## 2018-02-26 DIAGNOSIS — B370 Candidal stomatitis: Secondary | ICD-10-CM | POA: Diagnosis not present

## 2018-02-26 DIAGNOSIS — I10 Essential (primary) hypertension: Secondary | ICD-10-CM | POA: Diagnosis not present

## 2018-02-26 DIAGNOSIS — Z96653 Presence of artificial knee joint, bilateral: Secondary | ICD-10-CM | POA: Diagnosis present

## 2018-02-26 DIAGNOSIS — B9620 Unspecified Escherichia coli [E. coli] as the cause of diseases classified elsewhere: Secondary | ICD-10-CM | POA: Diagnosis not present

## 2018-02-26 DIAGNOSIS — S0181XD Laceration without foreign body of other part of head, subsequent encounter: Secondary | ICD-10-CM | POA: Diagnosis not present

## 2018-02-26 DIAGNOSIS — Z96641 Presence of right artificial hip joint: Secondary | ICD-10-CM | POA: Diagnosis present

## 2018-02-26 DIAGNOSIS — F329 Major depressive disorder, single episode, unspecified: Secondary | ICD-10-CM | POA: Diagnosis not present

## 2018-02-26 DIAGNOSIS — Z743 Need for continuous supervision: Secondary | ICD-10-CM | POA: Diagnosis not present

## 2018-02-26 DIAGNOSIS — I4819 Other persistent atrial fibrillation: Secondary | ICD-10-CM | POA: Diagnosis not present

## 2018-02-26 DIAGNOSIS — S32059D Unspecified fracture of fifth lumbar vertebra, subsequent encounter for fracture with routine healing: Secondary | ICD-10-CM | POA: Diagnosis not present

## 2018-02-26 DIAGNOSIS — Z79891 Long term (current) use of opiate analgesic: Secondary | ICD-10-CM | POA: Diagnosis not present

## 2018-02-26 DIAGNOSIS — R0689 Other abnormalities of breathing: Secondary | ICD-10-CM | POA: Diagnosis not present

## 2018-02-26 DIAGNOSIS — Z882 Allergy status to sulfonamides status: Secondary | ICD-10-CM | POA: Diagnosis not present

## 2018-02-26 DIAGNOSIS — F1120 Opioid dependence, uncomplicated: Secondary | ICD-10-CM | POA: Diagnosis not present

## 2018-02-26 DIAGNOSIS — W19XXXA Unspecified fall, initial encounter: Secondary | ICD-10-CM | POA: Diagnosis present

## 2018-02-26 DIAGNOSIS — K5790 Diverticulosis of intestine, part unspecified, without perforation or abscess without bleeding: Secondary | ICD-10-CM | POA: Diagnosis present

## 2018-02-26 DIAGNOSIS — Z87442 Personal history of urinary calculi: Secondary | ICD-10-CM | POA: Diagnosis not present

## 2018-02-26 DIAGNOSIS — M25562 Pain in left knee: Secondary | ICD-10-CM | POA: Diagnosis not present

## 2018-02-26 DIAGNOSIS — M069 Rheumatoid arthritis, unspecified: Secondary | ICD-10-CM | POA: Diagnosis not present

## 2018-02-26 DIAGNOSIS — M8580 Other specified disorders of bone density and structure, unspecified site: Secondary | ICD-10-CM | POA: Diagnosis not present

## 2018-02-26 DIAGNOSIS — Z87440 Personal history of urinary (tract) infections: Secondary | ICD-10-CM | POA: Diagnosis not present

## 2018-02-26 DIAGNOSIS — G894 Chronic pain syndrome: Secondary | ICD-10-CM | POA: Diagnosis not present

## 2018-02-26 DIAGNOSIS — B952 Enterococcus as the cause of diseases classified elsewhere: Secondary | ICD-10-CM | POA: Diagnosis not present

## 2018-02-26 DIAGNOSIS — I959 Hypotension, unspecified: Secondary | ICD-10-CM | POA: Diagnosis present

## 2018-02-26 DIAGNOSIS — G9341 Metabolic encephalopathy: Secondary | ICD-10-CM | POA: Diagnosis present

## 2018-02-26 DIAGNOSIS — E1151 Type 2 diabetes mellitus with diabetic peripheral angiopathy without gangrene: Secondary | ICD-10-CM | POA: Diagnosis not present

## 2018-02-26 LAB — GLUCOSE, CAPILLARY
Glucose-Capillary: 89 mg/dL (ref 70–99)
Glucose-Capillary: 91 mg/dL (ref 70–99)
Glucose-Capillary: 125 mg/dL — ABNORMAL HIGH (ref 70–99)

## 2018-02-26 LAB — PROCALCITONIN: Procalcitonin: <0.1 ng/mL

## 2018-02-26 MED ORDER — OXYMORPHONE HCL 5 MG PO TABS: 5.0000 mg | ORAL_TABLET | Freq: Three times a day (TID) | ORAL | 0 refills | Status: DC | PRN

## 2018-02-26 MED ORDER — METOPROLOL SUCCINATE ER 50 MG PO TB24: 50.0000 mg | ORAL_TABLET | Freq: Every day | ORAL | Status: DC

## 2018-02-26 MED ORDER — AMOXICILLIN 250 MG PO CAPS
500.0000 mg | ORAL_CAPSULE | Freq: Three times a day (TID) | ORAL | Status: DC
  Administered 2018-02-26: 500 mg via ORAL
  Filled 2018-02-26: qty 2

## 2018-02-26 MED ORDER — CEFDINIR 300 MG PO CAPS: 300.0000 mg | ORAL_CAPSULE | Freq: Two times a day (BID) | ORAL | 0 refills | Status: DC

## 2018-02-26 MED ORDER — AMOXICILLIN 500 MG PO CAPS: 500.0000 mg | ORAL_CAPSULE | Freq: Three times a day (TID) | ORAL | 0 refills | Status: DC

## 2018-02-26 MED ORDER — CEFDINIR 300 MG PO CAPS: 300.0000 mg | ORAL_CAPSULE | Freq: Two times a day (BID) | ORAL | Status: DC

## 2018-02-26 NOTE — NC FL2 (Signed)
[place] MEDICAID FL2 LEVEL OF CARE SCREENING TOOL     IDENTIFICATION  Patient Name: [name] Birthdate: 10-19-1933 Sex: female Admission Date (Current Location): 02/23/2018  [facility] and [place] Number:  [employer] and Address:  [place] [address], [place]      Provider Number: [id number]  Attending Physician Name and Address:  Orson Eva, MD  Relative Name and Phone Number:       Current Level of Care: Hospital Recommended Level of Care: [place] Prior Approval Number:    Date Approved/Denied:   PASRR Number:    Discharge Plan: SNF    Current Diagnoses: Patient Active Problem List   Diagnosis Date Noted  . Hypotension 02/23/2018  . Complication associated with peripherally inserted central catheter (PICC) 02/10/2018  . Acute delirium 02/09/2018  . Acute encephalopathy 02/09/2018  . Acute hypoxemic respiratory failure ([place]) 02/05/2018  . Acute on chronic respiratory failure with hypoxia ([place]) 02/03/2018  . Infestation by bed bug 02/03/2018  . Urinary tract infection due to extended-spectrum beta lactamase (ESBL) producing Escherichia coli 02/02/2018  . Chronic a-fib 02/02/2018  . Acute pyelonephritis 01/31/2018  . Constipation 01/31/2018  . Urolithiasis 01/30/2018  . Hydronephrosis with renal and ureteral calculus obstruction 01/30/2018  . Fall   . T12 compression fracture ([place]) 09/04/2017  . E coli bacteremia 07/03/2017  . Fall at home, initial encounter 07/03/2017  . Tachycardia 02/03/2017  . Sepsis secondary to UTI ([place]) 02/02/2017  . Chronic pain syndrome 02/02/2017  . Abnormal EKG 11/21/2013  . [name] (abdominal aortic aneurysm) ([place]) 09/30/2013  . OSA (obstructive sleep apnea) 06/29/2012  . Dementia ([place]) 06/29/2012  . Pre-diabetes 06/29/2012  . Hypoxia, sleep related 06/29/2012  . RA (rheumatoid arthritis) ([place]) 06/29/2012  . Nonspecific abnormal results of cardiovascular function study  06/02/2011  . Nonspecific abnormal electrocardiogram (ECG) (EKG) 05/18/2011  . OSTEOPOROSIS 04/09/2008  . FLANK PAIN, RIGHT 03/04/2008  . Hyperlipidemia 05/12/2006  . PYELONEPHRITIS, ACUTE 05/12/2006  . DEPENDENT EDEMA, LEGS, BILATERAL 05/12/2006  . URINARY INCONTINENCE 05/12/2006  . PERIPHERAL NEUROPATHY 03/11/2006  . Essential hypertension 03/11/2006  . DYSURIA 03/11/2006    Orientation RESPIRATION BLADDER Height & Weight     Self, Time, Situation, Place  Normal External catheter Weight: 75.6 kg Height:  5\' 4"  (162.6 cm)  BEHAVIORAL SYMPTOMS/MOOD NEUROLOGICAL BOWEL NUTRITION STATUS  (None)   Incontinent Diet(Heart)  AMBULATORY STATUS COMMUNICATION OF NEEDS Skin   Extensive Assist Verbally Skin abrasions(laceration, left, above eyebrow)                       Personal Care Assistance Level of Assistance    Bathing Assistance: Maximum assistance Feeding assistance: Independent Dressing Assistance: Maximum assistance     Functional Limitations Info  Sight, Hearing, Speech Sight Info: Adequate Hearing Info: Adequate Speech Info: Adequate    SPECIAL CARE FACTORS FREQUENCY  PT (By licensed PT), OT (By licensed OT)     PT Frequency: 3X W OT Frequency: 3X W            Contractures Contractures Info: Not present    Additional Factors Info  Code Status, Allergies Code Status Info: DNR Allergies Info: Sulfonamide Derivatives     Isolation Precautions Info: Contact Precaustions Infection: ESBL     Current Medications (02/26/2018):  This is the current hospital active medication list Current Facility-Administered Medications  Medication Dose Route Frequency Provider Last Rate Last Dose  . acetaminophen (TYLENOL) tablet 650 mg  650 mg Oral Q6H PRN Jani Gravel, MD       Or  . acetaminophen (TYLENOL) suppository 650 mg  650 mg Rectal Q6H PRN Jani Gravel, MD      . aspirin EC tablet 81 mg  81 mg Oral Daily Jani Gravel, MD   81 mg at 02/26/18 1057  . cholecalciferol  (VITAMIN D) tablet 2,000 Units  2,000 Units Oral Daily Jani Gravel, MD   2,000 Units at 02/26/18 1057  . DULoxetine (CYMBALTA) DR capsule 90 mg  90 mg Oral Daily Jani Gravel, MD   90 mg at 02/26/18 1056  . enoxaparin (LOVENOX) injection 40 mg  40 mg Subcutaneous Q24H Jani Gravel, MD   40 mg at 02/25/18 2159  . gabapentin (NEURONTIN) capsule 200 mg  200 mg Oral QHS Jani Gravel, MD   200 mg at 02/25/18 2159  . insulin aspart (novoLOG) injection 0-5 Units  0-5 Units Subcutaneous QHS Jani Gravel, MD      . insulin aspart (novoLOG) injection 0-9 Units  0-9 Units Subcutaneous TID WC Jani Gravel, MD   1 Units at 02/25/18 1300  . meropenem (MERREM) 1 g in sodium chloride 0.9 % 100 mL IVPB  1 g Intravenous Lysle Dingwall, MD 200 mL/hr at 02/26/18 0649 1 g at 02/26/18 0649  . metoprolol succinate (TOPROL-XL) 24 hr tablet 12.5 mg  12.5 mg Oral Once Tat, David, MD      . metoprolol succinate (TOPROL-XL) 24 hr tablet 25 mg  25 mg Oral Daily Tat, David, MD   25 mg at 02/26/18 1057  . morphine (MSIR) tablet 15 mg  15 mg Oral Q8H Jani Gravel, MD   15 mg at 02/26/18 0650  . QUEtiapine (SEROQUEL) tablet 25 mg  25 mg Oral Loma Sousa, MD   25 mg at 02/25/18 2159  . rosuvastatin (CRESTOR) tablet 40 mg  40 mg Oral QPM Jani Gravel, MD   40 mg at 02/25/18 1942     Discharge Medications: Please see discharge summary for a list of discharge medications.  Relevant Imaging Results:  Relevant Lab Results:   Additional Information ssn:[id number]   ,  Oral antibiotics  Trish Mage, LCSW

## 2018-02-26 NOTE — Discharge Summary (Addendum)
Physician Discharge Summary  CELLA CAPPELLO ZOX:[id number] DOB: 01-09-1934 DOA: 02/23/2018  PCP: [name], MD  Admit date: 02/23/2018 Discharge date: 02/26/2018  Admitted From: [name] Disposition:  [name]  Recommendations for Outpatient Follow-up:  1. Follow up with PCP in 1-2 weeks 2. Please obtain BMP/CBC in one week 3. Please transport patient to [facility] by [facility] for surgery 4. Keep foley catheter in place until surgery    Discharge Condition: Stable CODE STATUS:DNR Diet recommendation: Heart Healthy   Brief/Interim Summary: 84 year old female with a history of dementia, chronic atrial fibrillation, impaired glucose tolerance, spinal stenosis, hypertension, hyperlipidemia presented after an unwitnessed fall from her nursing facility, [facility]. This represents the patient's fourth hospital admission since January 30, 2018. Apparently, there was some concern the patient may have been confused. As result, the patient was brought to emergency department for further evaluation. Unfortunately, the patient is unable to provide a significant history secondary to her cognitive impairment. On her part, the patient denies any chest pain, shortness breath, nausea no vomiting, diarrhea, abdominal pain, dysuria. However she complains of some pain in her forehead where she fell and hit her head on the evening of 02/23/2018. The patient was most recently admitted to the hospital from 02/09/2018 through 02/12/2018 for acute metabolic encephalopathy. It was thought to been due to her continued infectious process. In addition, the patient was initially admitted from 01/30/2018 through 02/03/2018. During that admission, the patient was noted to have an infected right ureteral stone. She underwent cystoureteroscopy with placement of double-J stent in the right ureter by Dr. Louis Meckel. Cultures and urine cultures grew ESBL E. coli. The patient was  discharged back to nursing facility with a PICC line to finish ertapenem with anticipated stop date 02/14/2018. However, on her most recent admission, the patient's PICC line was noted to be displaced. It was removed on 02/10/2018. The patient's family deferred placement of another PICC line. However, from the documentation, it is unclear what type of IV access,if any, was placed to finish off the patient's course of ertapenem at the time of discharge on 02/12/2018.  Because of the the patient's fall and forehead laceration and concerns for confusion, the patient was admitted for further evaluation. The patient has remained afebrile hemodynamically stable saturating 90% on room air. Patient was noted to be mildly hypertensive in the emergency department and fluid resuscitated with improvement of her blood pressure. Lactic acid was 0.79. BMP and LFTs were unremarkable. WBC was 10.5 at the time of admission.  Discharge Diagnoses:   Pyelonephritis--GNR/ Infected renal stone -UA with greater than 50 WBC -culture = pansensitive Ecoli and enterococcus -Continue meropenem pending urine culture data -d/c with amoxil x 7 more days -high risk of sepsis and recurrent infection if d/c with oral abx -in the setting of recent JJ-stent placement from ureteral stone-->CT abd -10/26 CT abd--4 mm upper pole calculus seen on the prior CT scan is now in the right upper ureter adjacent to the double-J ureteral stent at the L2-3 level.  Mild persistent right-sided hydronephrosis. -case discussed with urology, Dr. Oneida Arenas foley -10/27 renal US--no hydronephrosis -pt has surgery scheduled for 03/01/18 to remove stent at [facility]  Acute metabolic encephalopathy -Likely multifactorial including the UTI, opioids, dehydration and possible sundowning in the setting of underlying dementia. -02/24/2018, the patient's mental status improved to baseline per daughter -02/10/2018 serum B12-297, ammonia 15,  TSH 3.641, RPR nonreactive, RBC folate 1338  Hypotension -lactic acid 0.79 -improved with IVF  Nephrolithiasis with ureteral obstruction status post stent -01/31/18--right ureteral stent placed, Dr. Louis Meckel -will need outpatient follow-up for stent removal -02/11/2018 CT abdomen and pelvis--persistent mild right pelvic caliectasis with hyperenhancement of the right ureteral collecting system and ureteral urothelium;interval placement of right double-J stent  Mechanical fall with forehead laceration -The patient is not a candidate for anticoagulation secondary to her numerous falls -Steri-Strips were placed in the emergency department -Local wound care  Chronic atrial fibrillation -Rate controlled -Gradually increase metoprolol succinate dose back to 50 mg daily -CHADSVASc = 4 (HTN, Age, female) -not a candidate for [facility] due to multiple falls  Chronic pain syndrome -Continue home dose of gabapentin and formulary equivalent for Opana  Hyperlipidemia -Continue Crestor  [name] -02/11/18 CT abd = 3.9 cm -outpt surveillance  Left knee pain -10/26 xray--neg for fracture or dislocation -secondary to DJD and recent fall -uric acid 206     Discharge Instructions   Allergies as of 02/26/2018      Reactions   Sulfonamide Derivatives Hives, Rash      Medication List    TAKE these medications   aspirin EC 81 MG tablet Take 81 mg by mouth daily.   cefdinir 300 MG capsule Commonly known as:  OMNICEF Take 1 capsule (300 mg total) by mouth every 12 (twelve) hours.   DULoxetine 30 MG capsule Commonly known as:  CYMBALTA Take 90 mg by mouth daily.   gabapentin 100 MG capsule Commonly known as:  NEURONTIN Take 2 capsules (200 mg total) by mouth at bedtime.   metoprolol succinate 50 MG 24 hr tablet Commonly known as:  TOPROL-XL Take 1 tablet (50 mg total) by mouth daily. Take with or immediately following a meal.   nystatin cream Commonly known as:   MYCOSTATIN Apply 1 application topically 2 (two) times daily. Apply under breast until rash resolves   oxymorphone 5 MG tablet Commonly known as:  OPANA Take 1 tablet (5 mg total) by mouth every 8 (eight) hours as needed for pain. What changed:    when to take this  reasons to take this   QUEtiapine 25 MG tablet Commonly known as:  SEROQUEL Take 1 tablet (25 mg total) by mouth at bedtime.   rosuvastatin 40 MG tablet Commonly known as:  CRESTOR TAKE 1 TABLET BY MOUTH EVERY DAY What changed:  when to take this   Vitamin D3 2000 units Tabs Take 2,000 Units by mouth daily.       Allergies  Allergen Reactions  . Sulfonamide Derivatives Hives and Rash    Consultations:  urology   Procedures/Studies: Ct Abdomen Pelvis W Wo Contrast  Result Date: 02/11/2018 CLINICAL DATA:  Patient with sepsis.  Pyelonephritis. EXAM: CT ABDOMEN AND PELVIS WITHOUT AND WITH CONTRAST TECHNIQUE: Multidetector CT imaging of the abdomen and pelvis was performed following the standard protocol before and following the bolus administration of intravenous contrast. CONTRAST:  157mL ISOVUE-300 IOPAMIDOL (ISOVUE-300) INJECTION 61% COMPARISON:  CT abdomen pelvis 01/30/2018 FINDINGS: Lower chest: Heart is enlarged. Coronary arterial vascular calcifications. Moderate layering bilateral pleural effusions. Subpleural consolidation within the lower lobes bilaterally favored to represent atelectasis. Hepatobiliary: The liver is normal in size and contour. No focal hepatic lesions identified. Gallbladder is surgically absent. Pancreas: Unremarkable Spleen: Unremarkable Adrenals/Urinary Tract: Normal adrenal glands. Noncontrast images demonstrate a 4 mm stone within the superior aspect of the right renal collecting system (image 26; series 2). Mild right pelviectasis. Right double-J nephroureteral stent is in place. Kidneys enhance symmetrically with contrast. There is  right renal collecting system and ureteral  urothelial thickening and hyperenhancement. Urinary bladder is decompressed with a Foley catheter. Small amount of gas within the urinary bladder. Stomach/Bowel: Sigmoid colonic diverticulosis. No CT evidence for acute diverticulitis. No evidence for small bowel obstruction. No free fluid or free intraperitoneal air. Normal morphology of the stomach. Vascular/Lymphatic: Tortuous dilated abdominal aorta measuring up to 3.9 cm (image 29; series 5). Peripheral calcified atherosclerotic plaque. Atherosclerotic narrowing of the proximal aspect of the celiac axis and superior mesenteric artery. No retroperitoneal lymphadenopathy. Reproductive: Status post hysterectomy. Other: None. Musculoskeletal: Similar-appearing T12 and L5 subacute compression deformities. IMPRESSION: 1. Interval insertion of right-sided double-J nephroureteral stent. Persistent mild right pelviectasis. There is hyperenhancement and thickening of the right renal collecting system and ureteral urothelium which may be reactive in etiology. Superimposed infection not excluded. 2. 4 mm stone superior pole right renal collecting system. 3. Abdominal aorta measures 3.9 cm. Recommend followup by ultrasound in 2 years. This recommendation follows ACR consensus guidelines: White Paper of the ACR Incidental Findings Committee II on Vascular Findings. J Am Coll Radiol 2013; 10:789-794. 4. Sigmoid colonic diverticulosis without evidence for acute diverticulitis. 5. T12 and L5 subacute compression deformities. Electronically Signed   By: Lovey Newcomer M.D.   On: 02/11/2018 16:32   Ct Abdomen Pelvis Wo Contrast  Result Date: 02/24/2018 CLINICAL DATA:  Pyelonephritis. EXAM: CT ABDOMEN AND PELVIS WITHOUT CONTRAST TECHNIQUE: Multidetector CT imaging of the abdomen and pelvis was performed following the standard protocol without IV contrast. COMPARISON:  CT scan 02/11/2018 FINDINGS: Lower chest: Persistent bilateral pleural effusions and overlying atelectasis. The  heart is mildly enlarged but stable. Stable extensive aortic and coronary artery calcifications. Hepatobiliary: No focal hepatic lesions or intrahepatic biliary dilatation. The gallbladder surgically absent. Mild associated common bile duct dilatation. Pancreas: Moderate atrophy. No mass, inflammation or ductal dilatation. Spleen: Normal size.  No focal lesions. Adrenals/Urinary Tract: The adrenal glands are normal in stable. Double-J ureteral stent in place on the right side. Mild residual hydronephrosis. The right renal calculus is no longer identified. It is now in the right ureter adjacent to the stent best seen on image number 42 of series 2 and image number 57 of series 5. No left-sided hydroureteronephrosis, renal or ureteral calculi. No bladder calculi. Stomach/Bowel: The stomach, duodenum, small bowel and colon are grossly normal without oral contrast. No inflammatory changes, mass lesions or obstructive findings. The terminal ileum and appendix are normal. Marked sigmoid colon diverticulosis without findings for acute diverticulitis. Vascular/Lymphatic: Severe atherosclerotic calcifications involving the aorta and branch vessels with stable areas of aneurysmal dilatation. No mesenteric or retroperitoneal mass or adenopathy. Reproductive: The uterus is surgically absent. Both ovaries are still present and appear normal. Other: No pelvic mass or free pelvic fluid collections. No inguinal mass or adenopathy. Musculoskeletal: Stable T12 compression fracture and mild L1 fracture and mild sclerosis. IMPRESSION: 1. The 4 mm upper pole calculus seen on the prior CT scan is now in the right upper ureter adjacent to the double-J ureteral stent at the L2-3 level. 2. Mild persistent right-sided hydronephrosis. 3. No acute abdominal findings otherwise. 4. Persistent small bilateral pleural effusions and overlying atelectasis. 5. Severe atherosclerotic disease. Electronically Signed   By: Marijo Sanes M.D.   On:  02/24/2018 12:04   Dg Chest 2 View  Result Date: 02/23/2018 CLINICAL DATA:  84 year old with hypoxia. EXAM: CHEST - 2 VIEW COMPARISON:  02/10/2018 FINDINGS: Stable enlargement of the cardiac silhouette. Atherosclerotic calcifications at the aortic arch. Densities at both  lung bases are compatible with small pleural effusions and atelectasis/consolidation. Negative for pulmonary edema. Right arm PICC line has been removed. IMPRESSION: Persistent basilar chest densities, left side greater than right. Findings are suggestive for small effusions with atelectasis/consolidation. Minimal change from the previous examination. Stable cardiomegaly. Aortic atherosclerosis. Electronically Signed   By: Markus Daft M.D.   On: 02/23/2018 17:43   Dg Chest 2 View  Result Date: 02/09/2018 CLINICAL DATA:  Mental status change.  Shortness of breath. EXAM: CHEST - 2 VIEW COMPARISON:  Chest x-rays dated 02/05/2018 and 02/03/2018. FINDINGS: Stable cardiomegaly. Stable small bilateral pleural effusions. Probable associated mild edema within the RIGHT lower lobe. No pneumothorax seen. RIGHT-sided PICC line is incompletely visualized, tip directed into the lower RIGHT neck. IMPRESSION: 1. PICC line has been displaced since the previous chest x-ray, with tip now directed upwards in the RIGHT neck (tip presumably in the RIGHT internal jugular vein). Recommend repositioning. 2. Stable small bilateral pleural effusions. Probable associated mild edema within the RIGHT lower lobe. 3. Stable cardiomegaly. Electronically Signed   By: Franki Cabot M.D.   On: 02/09/2018 17:45   Dg Knee 1-2 Views Left  Result Date: 02/24/2018 CLINICAL DATA:  Left knee pain following a fall. EXAM: LEFT KNEE - 1-2 VIEW COMPARISON:  02/03/2018. FINDINGS: Marked lateral joint space narrowing with progression. Extensive tricompartmental spur formation. Moderate-sized effusion. Extensive atheromatous arterial calcifications. No fracture or dislocation seen.  IMPRESSION: 1. No fracture or dislocation. 2. Extensive tricompartmental degenerative changes with progression. 3. Interval moderate sized effusion. Electronically Signed   By: Claudie Revering M.D.   On: 02/24/2018 17:00   Ct Head Wo Contrast  Result Date: 02/23/2018 CLINICAL DATA:  Pt states she fell onto her forehead. EXAM: CT HEAD WITHOUT CONTRAST CT CERVICAL SPINE WITHOUT CONTRAST TECHNIQUE: Multidetector CT imaging of the head and cervical spine was performed following the standard protocol without intravenous contrast. Multiplanar CT image reconstructions of the cervical spine were also generated. COMPARISON:  None. FINDINGS: CT HEAD FINDINGS Brain: There is significant central cortical atrophy. Periventricular white matter changes are present. There is no intra or extra-axial fluid collection or mass lesion. The basilar cisterns and ventricles have a normal appearance. There is no CT evidence for acute infarction or hemorrhage. Vascular: There is dense atherosclerotic calcification of the internal carotid arteries. No hyperdense vessels. Skull: Normal. Negative for fracture or focal lesion. Sinuses/Orbits: No acute finding. Other: Partially imaged LEFT frontal scalp edema and laceration. No underlying fracture. CT CERVICAL SPINE FINDINGS Alignment: Vertebral bodies are normally aligned with preservation of the normal cervical lordosis.There is degenerative type anterolisthesis of C4 on C5 measuring 2 millimeters. Skull base and vertebrae: No acute fracture. No primary bone lesion or focal pathologic process. Soft tissues and spinal canal: No prevertebral fluid or swelling. No visible canal hematoma. There is extensive atherosclerotic calcification of the carotid arteries. Disc levels:  Mild disc height loss throughout the cervical spine. Upper chest: There is a LEFT pleural effusion. A partially imaged thoracic aorta shows significant atherosclerotic calcification. Other: None IMPRESSION: 1.  No evidence  for acute intracranial abnormality. 2. Atrophy and small vessel disease. 3. LEFT frontal scalp laceration and edema. 4. Degenerative changes in the cervical spine without evidence for acute cervical spine fracture. 5. Significant atherosclerotic calcification of the carotid arteries. 6. LEFT pleural effusion. Recommend further evaluation with two-view chest x-ray. 7.  Aortic atherosclerosis.  (ICD10-I70.0) Electronically Signed   By: Nolon Nations M.D.   On: 02/23/2018 16:30   Ct  Head Wo Contrast  Result Date: 02/09/2018 CLINICAL DATA:  Confusion and delirium with altered level of consciousness. EXAM: CT HEAD WITHOUT CONTRAST TECHNIQUE: Contiguous axial images were obtained from the base of the skull through the vertex without intravenous contrast. COMPARISON:  CT exams dating back to 07/03/2017 FINDINGS: Brain: Atrophy with chronic moderate small vessel ischemia. General calcifications and along the right parietal lobe. Remote right pontine lacunar infarct. No large vascular territory infarction, hemorrhage or midline shift. No intra-axial mass nor extra-axial fluid. Midline fourth ventricle and basal cisterns. Vascular: Vascular calcifications along both vertebral and cavernous internal carotid arteries. No hyperdense vessel sign. Skull: No acute skull fracture. Sinuses/Orbits: Bilateral lens replacements. The paranasal sinuses mastoids. Other: Dystrophic intramuscular calcifications redemonstrated along the right infratemporal fossa. IMPRESSION: 1. Atrophy with chronic moderate small vessel ischemia. Chronic right pontine lacunar infarct. 2. No acute intracranial abnormality. Electronically Signed   By: Ashley Royalty M.D.   On: 02/09/2018 20:06   Ct Angio Chest Pe W And/or Wo Contrast  Result Date: 02/04/2018 CLINICAL DATA:  Complex chest pain, intermediate clinical probability for pulmonary embolism, hypoxia, history atrial fibrillation, dementia, diabetes mellitus, hypertension EXAM: CT ANGIOGRAPHY  CHEST WITH CONTRAST TECHNIQUE: Multidetector CT imaging of the chest was performed using the standard protocol during bolus administration of intravenous contrast. Multiplanar CT image reconstructions and MIPs were obtained to evaluate the vascular anatomy. CONTRAST:  171mL ISOVUE-370 IOPAMIDOL (ISOVUE-370) INJECTION 76% IV COMPARISON:  09/04/2017 FINDINGS: Cardiovascular: Extensive atherosclerotic calcifications aorta, proximal great vessels and coronary arteries. Ascending thoracic aorta upper normal caliber. Pulmonary arteries well opacified and patent. No evidence of pulmonary embolism. Enlargement of cardiac chambers. No pericardial effusion. Mediastinum/Nodes: Esophagus unremarkable. Base of cervical region normal appearance. No thoracic adenopathy. Lungs/Pleura: BILATERAL pleural effusions with atelectasis of the posterior lower lobes bilaterally. Remaining lungs clear though scattered respiratory motion artifacts are present. Upper Abdomen: Last image demonstrates ectasia of the abdominal aorta, noted on the 09/14/2017 exam, inadequately assessed on this study. Remaining visualized upper abdomen unremarkable. Musculoskeletal: Bones demineralized. Marked compression fracture of T12 vertebral body increased since previous exam. Review of the MIP images confirms the above findings. IMPRESSION: No evidence of pulmonary embolism. Extensive atherosclerotic calcifications of aorta, proximal great vessels and coronary arteries. Small BILATERAL pleural effusions and bibasilar atelectasis. Increased compression fracture of T12 vertebral body. Aortic Atherosclerosis (ICD10-I70.0). Electronically Signed   By: Lavonia Dana M.D.   On: 02/04/2018 13:16   Ct Cervical Spine Wo Contrast  Result Date: 02/23/2018 CLINICAL DATA:  Pt states she fell onto her forehead. EXAM: CT HEAD WITHOUT CONTRAST CT CERVICAL SPINE WITHOUT CONTRAST TECHNIQUE: Multidetector CT imaging of the head and cervical spine was performed following the  standard protocol without intravenous contrast. Multiplanar CT image reconstructions of the cervical spine were also generated. COMPARISON:  None. FINDINGS: CT HEAD FINDINGS Brain: There is significant central cortical atrophy. Periventricular white matter changes are present. There is no intra or extra-axial fluid collection or mass lesion. The basilar cisterns and ventricles have a normal appearance. There is no CT evidence for acute infarction or hemorrhage. Vascular: There is dense atherosclerotic calcification of the internal carotid arteries. No hyperdense vessels. Skull: Normal. Negative for fracture or focal lesion. Sinuses/Orbits: No acute finding. Other: Partially imaged LEFT frontal scalp edema and laceration. No underlying fracture. CT CERVICAL SPINE FINDINGS Alignment: Vertebral bodies are normally aligned with preservation of the normal cervical lordosis.There is degenerative type anterolisthesis of C4 on C5 measuring 2 millimeters. Skull base and vertebrae: No acute fracture. No  primary bone lesion or focal pathologic process. Soft tissues and spinal canal: No prevertebral fluid or swelling. No visible canal hematoma. There is extensive atherosclerotic calcification of the carotid arteries. Disc levels:  Mild disc height loss throughout the cervical spine. Upper chest: There is a LEFT pleural effusion. A partially imaged thoracic aorta shows significant atherosclerotic calcification. Other: None IMPRESSION: 1.  No evidence for acute intracranial abnormality. 2. Atrophy and small vessel disease. 3. LEFT frontal scalp laceration and edema. 4. Degenerative changes in the cervical spine without evidence for acute cervical spine fracture. 5. Significant atherosclerotic calcification of the carotid arteries. 6. LEFT pleural effusion. Recommend further evaluation with two-view chest x-ray. 7.  Aortic atherosclerosis.  (ICD10-I70.0) Electronically Signed   By: Nolon Nations M.D.   On: 02/23/2018 16:30    US Renal  Result Date: 02/25/2018 CLINICAL DATA:  Followup for right hydronephrosis. EXAM: RENAL / URINARY TRACT ULTRASOUND COMPLETE COMPARISON:  CT, 02/24/2018. FINDINGS: Right Kidney: Length: 10.2 cm. Normal parenchymal echogenicity. The ureteral stent noted on the previous day's CT is not visualized. There is no hydronephrosis. 2.3 x 3.0 x 3.3 cm cyst arises from the lower pole mildly increased in size from a renal ultrasound dated 07/03/2017. No other renal masses. No visualized stones. Left Kidney: Length: 10.5 cm. Echogenicity within normal limits. No mass or hydronephrosis visualized. Bladder: Not visualized, decompressed with a Foley catheter. IMPRESSION: 1. No acute findings.  No hydronephrosis. 2. 3.3 cm lower pole right renal cyst. Electronically Signed   By: Lajean Manes M.D.   On: 02/25/2018 12:06   Dg Chest Port 1 View  Result Date: 02/10/2018 CLINICAL DATA:  Right peripheral picc placement EXAM: PORTABLE CHEST 1 VIEW COMPARISON:  Chest x-ray dated 02/09/2018. FINDINGS: RIGHT-sided PICC line tip is still directed into the RIGHT lower neck, presumably within the internal jugular vein. Stable cardiomegaly. Overall cardiomediastinal silhouette is peers stable. Persistent opacity at the LEFT lung base, likely small pleural effusion and/or atelectasis. No new lung findings. No pneumothorax seen. IMPRESSION: 1. RIGHT-sided PICC line is again directed into the lower RIGHT neck with tip presumably within the internal jugular vein. 2. Persistent opacity at the LEFT lung base, compatible with the small LEFT pleural effusion and associated atelectasis demonstrated on earlier chest CT of 02/04/2018. Probable small residual pleural effusion at the RIGHT lung base. No new lung findings. No pneumothorax. These results will be called to the ordering clinician or representative by the Radiologist Assistant, and communication documented in the PACS or zVision Dashboard. Electronically Signed   By: Franki Cabot M.D.   On: 02/10/2018 11:30   Dg Chest Port 1 View  Result Date: 02/05/2018 CLINICAL DATA:  Hypoxia. EXAM: PORTABLE CHEST 1 VIEW COMPARISON:  Chest CT yesterday. FINDINGS: Right upper extremity PICC tip in the proximal SVC. Again seen cardiomegaly with tortuous atherosclerotic thoracic aorta. Bilateral pleural effusions, unchanged of the left but worsened on the right. Slight vascular congestion. No pulmonary edema or pneumothorax. IMPRESSION: 1. Bilateral pleural effusions, increasing on the right and unchanged on the left. 2. Cardiomegaly with slight vascular congestion. Atherosclerotic tortuous thoracic aorta. Electronically Signed   By: Keith Rake M.D.   On: 02/05/2018 05:21   Dg Chest Port 1 View  Result Date: 02/03/2018 CLINICAL DATA:  Low O2 sats. EXAM: PORTABLE CHEST 1 VIEW COMPARISON:  February 02, 2018 FINDINGS: There is a left-sided pleural effusion with underlying opacity, similar in the interval. The probable tiny layering effusion on the right seen yesterday is not definitely  seen today. A right PICC line is in good position. No pneumothorax. Stable cardiomegaly. No other change. IMPRESSION: 1. The right PICC line is in good position. 2. Left-sided pleural effusion with underlying opacity, not significantly changed. Electronically Signed   By: Dorise Bullion III M.D   On: 02/03/2018 19:31   Dg Chest Port 1 View  Result Date: 02/02/2018 CLINICAL DATA:  PICC line placement evaluation. EXAM: PORTABLE CHEST 1 VIEW COMPARISON:  Sep 04, 2017 FINDINGS: A right PICC line terminates in the central SVC. Stable cardiomegaly. Small bilateral effusions are new. No other interval changes or acute abnormalities. IMPRESSION: 1. The right PICC line terminates in the central SVC. 2. Small bilateral effusions with underlying atelectasis, new in the interval. Electronically Signed   By: Dorise Bullion III M.D   On: 02/02/2018 21:27   Dg Knee Complete 4 Views Left  Result Date:  02/03/2018 CLINICAL DATA:  Left knee pain today. EXAM: LEFT KNEE - COMPLETE 4+ VIEW COMPARISON:  Remote radiographs 12/16/2005 FINDINGS: No fracture or dislocation. Tricompartmental osteoarthritis with peripheral spurring, progressed from remote prior exam. Lateral tibiofemoral joint space narrowing. There is tibiofemoral chondrocalcinosis. Small joint effusion. Advanced vascular calcifications. IMPRESSION: Moderate to advanced tricompartmental osteoarthritis without acute osseous abnormality. Electronically Signed   By: Keith Rake M.D.   On: 02/03/2018 23:27   Dg C-arm 1-60 Min-no Report  Result Date: 01/31/2018 Fluoroscopy was utilized by the requesting physician.  No radiographic interpretation.   Ct Renal Stone Study  Result Date: 01/30/2018 CLINICAL DATA:  Acute abdominal flank pain worse on the right EXAM: CT ABDOMEN AND PELVIS WITHOUT CONTRAST TECHNIQUE: Multidetector CT imaging of the abdomen and pelvis was performed following the standard protocol without IV contrast. COMPARISON:  09/04/2017 FINDINGS: Lower chest: Bibasilar atelectasis. Mild cardiomegaly. No pericardial or pleural effusion. Small hiatal hernia. Descending thoracic aorta is tortuous and ectatic with atherosclerosis. Hepatobiliary: No focal liver abnormality is seen. Status post cholecystectomy. No biliary dilatation. Pancreas: Unremarkable. No pancreatic ductal dilatation or surrounding inflammatory changes. Spleen: Normal in size without focal abnormality. Adrenals/Urinary Tract: Normal adrenal glands. Right kidney demonstrates acute perinephric strandy edema/inflammation. There is associated moderate right hydroureteronephrosis secondary to a proximal obstructing right ureteral calculus measuring 6 mm, image 34 series 2. There is a stable 2.5 cm right kidney lower pole hypodense cyst medially. Left kidney and ureter demonstrate no acute process or obstruction. Left ureter is nondilated. Air noted within the bladder dome.   Bladder otherwise unremarkable. Stomach/Bowel: Negative for bowel obstruction, significant dilatation, ileus, or free air. Appendix not visualized. No fluid collection or abscess. Moderate colonic stool burden. Extensive sigmoid diverticulosis without acute inflammatory process. Vascular/Lymphatic: Atherosclerosis of the aorta. Infrarenal aorta has aneurysm dilatation measuring 3.9 cm. Extensive iliac atherosclerosis as well. No retroperitoneal hemorrhage or hematoma. No adenopathy. Reproductive: Remote hysterectomy. No other pelvic abnormality, fluid collection, ascites, or abscess. Other: No abdominal wall hernia or abnormality. No abdominopelvic ascites. Musculoskeletal: Bones are osteopenic. Degenerative changes of the spine. Extensive facet arthropathy. T12 and L5 subacute partially healing compression fractures noted when compared to 07/03/2017. IMPRESSION: Right proximal ureteral 6 mm obstructing calculus with associated right hydroureteronephrosis and perinephric/ureteral inflammation and edema. 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 Remote cholecystectomy, appendectomy and hysterectomy Colonic diverticulosis without acute inflammation or complicating feature Subacute T12 and L5 compression fractures, new since 07/03/2017 Electronically Signed   By: Jerilynn Mages.  Shick M.D.   On: 01/30/2018 22:00   [place] Ekg Site Rite  Result Date:  02/10/2018 If Site Rite image not attached, placement could not be confirmed due to current cardiac rhythm.  [place] Ekg Site Rite  Result Date: 02/02/2018 If Site Rite image not attached, placement could not be confirmed due to current cardiac rhythm.       Discharge Exam: Vitals:   02/26/18 0549 02/26/18 0552  BP: 121/85   Pulse: (!) 170 98  Resp: 20   Temp: 98 F (36.7 C)   SpO2: (!) 86% 96%   Vitals:   02/25/18 1405 02/25/18 2243 02/26/18 0549 02/26/18 0552  BP: (!) 106/58 116/77 121/85   Pulse: 95 95  (!) 170 98  Resp: 20 20 20    Temp: 98.5 F (36.9 C) 98.9 F (37.2 C) 98 F (36.7 C)   TempSrc:  Oral Oral   SpO2: 94% 94% (!) 86% 96%  Weight:    75.6 kg  Height:        General: Pt is alert, awake, not in acute distress Cardiovascular: IRRR, S1/S2 +, no rubs, no gallops Respiratory: bibasilar rales, no wheeze Abdominal: Soft, NT, ND, bowel sounds + Extremities: no edema, no cyanosis   The results of significant diagnostics from this hospitalization (including imaging, microbiology, ancillary and laboratory) are listed below for reference.    Significant Diagnostic Studies: Ct Abdomen Pelvis W Wo Contrast  Result Date: 02/11/2018 CLINICAL DATA:  Patient with sepsis.  Pyelonephritis. EXAM: CT ABDOMEN AND PELVIS WITHOUT AND WITH CONTRAST TECHNIQUE: Multidetector CT imaging of the abdomen and pelvis was performed following the standard protocol before and following the bolus administration of intravenous contrast. CONTRAST:  127mL ISOVUE-300 IOPAMIDOL (ISOVUE-300) INJECTION 61% COMPARISON:  CT abdomen pelvis 01/30/2018 FINDINGS: Lower chest: Heart is enlarged. Coronary arterial vascular calcifications. Moderate layering bilateral pleural effusions. Subpleural consolidation within the lower lobes bilaterally favored to represent atelectasis. Hepatobiliary: The liver is normal in size and contour. No focal hepatic lesions identified. Gallbladder is surgically absent. Pancreas: Unremarkable Spleen: Unremarkable Adrenals/Urinary Tract: Normal adrenal glands. Noncontrast images demonstrate a 4 mm stone within the superior aspect of the right renal collecting system (image 26; series 2). Mild right pelviectasis. Right double-J nephroureteral stent is in place. Kidneys enhance symmetrically with contrast. There is right renal collecting system and ureteral urothelial thickening and hyperenhancement. Urinary bladder is decompressed with a Foley catheter. Small amount of gas within the urinary bladder.  Stomach/Bowel: Sigmoid colonic diverticulosis. No CT evidence for acute diverticulitis. No evidence for small bowel obstruction. No free fluid or free intraperitoneal air. Normal morphology of the stomach. Vascular/Lymphatic: Tortuous dilated abdominal aorta measuring up to 3.9 cm (image 29; series 5). Peripheral calcified atherosclerotic plaque. Atherosclerotic narrowing of the proximal aspect of the celiac axis and superior mesenteric artery. No retroperitoneal lymphadenopathy. Reproductive: Status post hysterectomy. Other: None. Musculoskeletal: Similar-appearing T12 and L5 subacute compression deformities. IMPRESSION: 1. Interval insertion of right-sided double-J nephroureteral stent. Persistent mild right pelviectasis. There is hyperenhancement and thickening of the right renal collecting system and ureteral urothelium which may be reactive in etiology. Superimposed infection not excluded. 2. 4 mm stone superior pole right renal collecting system. 3. Abdominal aorta measures 3.9 cm. Recommend followup by ultrasound in 2 years. This recommendation follows ACR consensus guidelines: White Paper of the ACR Incidental Findings Committee II on Vascular Findings. J Am Coll Radiol 2013; 10:789-794. 4. Sigmoid colonic diverticulosis without evidence for acute diverticulitis. 5. T12 and L5 subacute compression deformities. Electronically Signed   By: Lovey Newcomer M.D.   On: 02/11/2018 16:32   Ct Abdomen Pelvis  Wo Contrast  Result Date: 02/24/2018 CLINICAL DATA:  Pyelonephritis. EXAM: CT ABDOMEN AND PELVIS WITHOUT CONTRAST TECHNIQUE: Multidetector CT imaging of the abdomen and pelvis was performed following the standard protocol without IV contrast. COMPARISON:  CT scan 02/11/2018 FINDINGS: Lower chest: Persistent bilateral pleural effusions and overlying atelectasis. The heart is mildly enlarged but stable. Stable extensive aortic and coronary artery calcifications. Hepatobiliary: No focal hepatic lesions or  intrahepatic biliary dilatation. The gallbladder surgically absent. Mild associated common bile duct dilatation. Pancreas: Moderate atrophy. No mass, inflammation or ductal dilatation. Spleen: Normal size.  No focal lesions. Adrenals/Urinary Tract: The adrenal glands are normal in stable. Double-J ureteral stent in place on the right side. Mild residual hydronephrosis. The right renal calculus is no longer identified. It is now in the right ureter adjacent to the stent best seen on image number 42 of series 2 and image number 57 of series 5. No left-sided hydroureteronephrosis, renal or ureteral calculi. No bladder calculi. Stomach/Bowel: The stomach, duodenum, small bowel and colon are grossly normal without oral contrast. No inflammatory changes, mass lesions or obstructive findings. The terminal ileum and appendix are normal. Marked sigmoid colon diverticulosis without findings for acute diverticulitis. Vascular/Lymphatic: Severe atherosclerotic calcifications involving the aorta and branch vessels with stable areas of aneurysmal dilatation. No mesenteric or retroperitoneal mass or adenopathy. Reproductive: The uterus is surgically absent. Both ovaries are still present and appear normal. Other: No pelvic mass or free pelvic fluid collections. No inguinal mass or adenopathy. Musculoskeletal: Stable T12 compression fracture and mild L1 fracture and mild sclerosis. IMPRESSION: 1. The 4 mm upper pole calculus seen on the prior CT scan is now in the right upper ureter adjacent to the double-J ureteral stent at the L2-3 level. 2. Mild persistent right-sided hydronephrosis. 3. No acute abdominal findings otherwise. 4. Persistent small bilateral pleural effusions and overlying atelectasis. 5. Severe atherosclerotic disease. Electronically Signed   By: Marijo Sanes M.D.   On: 02/24/2018 12:04   Dg Chest 2 View  Result Date: 02/23/2018 CLINICAL DATA:  84 year old with hypoxia. EXAM: CHEST - 2 VIEW COMPARISON:   02/10/2018 FINDINGS: Stable enlargement of the cardiac silhouette. Atherosclerotic calcifications at the aortic arch. Densities at both lung bases are compatible with small pleural effusions and atelectasis/consolidation. Negative for pulmonary edema. Right arm PICC line has been removed. IMPRESSION: Persistent basilar chest densities, left side greater than right. Findings are suggestive for small effusions with atelectasis/consolidation. Minimal change from the previous examination. Stable cardiomegaly. Aortic atherosclerosis. Electronically Signed   By: Markus Daft M.D.   On: 02/23/2018 17:43   Dg Chest 2 View  Result Date: 02/09/2018 CLINICAL DATA:  Mental status change.  Shortness of breath. EXAM: CHEST - 2 VIEW COMPARISON:  Chest x-rays dated 02/05/2018 and 02/03/2018. FINDINGS: Stable cardiomegaly. Stable small bilateral pleural effusions. Probable associated mild edema within the RIGHT lower lobe. No pneumothorax seen. RIGHT-sided PICC line is incompletely visualized, tip directed into the lower RIGHT neck. IMPRESSION: 1. PICC line has been displaced since the previous chest x-ray, with tip now directed upwards in the RIGHT neck (tip presumably in the RIGHT internal jugular vein). Recommend repositioning. 2. Stable small bilateral pleural effusions. Probable associated mild edema within the RIGHT lower lobe. 3. Stable cardiomegaly. Electronically Signed   By: Franki Cabot M.D.   On: 02/09/2018 17:45   Dg Knee 1-2 Views Left  Result Date: 02/24/2018 CLINICAL DATA:  Left knee pain following a fall. EXAM: LEFT KNEE - 1-2 VIEW COMPARISON:  02/03/2018. FINDINGS: Marked  lateral joint space narrowing with progression. Extensive tricompartmental spur formation. Moderate-sized effusion. Extensive atheromatous arterial calcifications. No fracture or dislocation seen. IMPRESSION: 1. No fracture or dislocation. 2. Extensive tricompartmental degenerative changes with progression. 3. Interval moderate sized  effusion. Electronically Signed   By: Claudie Revering M.D.   On: 02/24/2018 17:00   Ct Head Wo Contrast  Result Date: 02/23/2018 CLINICAL DATA:  Pt states she fell onto her forehead. EXAM: CT HEAD WITHOUT CONTRAST CT CERVICAL SPINE WITHOUT CONTRAST TECHNIQUE: Multidetector CT imaging of the head and cervical spine was performed following the standard protocol without intravenous contrast. Multiplanar CT image reconstructions of the cervical spine were also generated. COMPARISON:  None. FINDINGS: CT HEAD FINDINGS Brain: There is significant central cortical atrophy. Periventricular white matter changes are present. There is no intra or extra-axial fluid collection or mass lesion. The basilar cisterns and ventricles have a normal appearance. There is no CT evidence for acute infarction or hemorrhage. Vascular: There is dense atherosclerotic calcification of the internal carotid arteries. No hyperdense vessels. Skull: Normal. Negative for fracture or focal lesion. Sinuses/Orbits: No acute finding. Other: Partially imaged LEFT frontal scalp edema and laceration. No underlying fracture. CT CERVICAL SPINE FINDINGS Alignment: Vertebral bodies are normally aligned with preservation of the normal cervical lordosis.There is degenerative type anterolisthesis of C4 on C5 measuring 2 millimeters. Skull base and vertebrae: No acute fracture. No primary bone lesion or focal pathologic process. Soft tissues and spinal canal: No prevertebral fluid or swelling. No visible canal hematoma. There is extensive atherosclerotic calcification of the carotid arteries. Disc levels:  Mild disc height loss throughout the cervical spine. Upper chest: There is a LEFT pleural effusion. A partially imaged thoracic aorta shows significant atherosclerotic calcification. Other: None IMPRESSION: 1.  No evidence for acute intracranial abnormality. 2. Atrophy and small vessel disease. 3. LEFT frontal scalp laceration and edema. 4. Degenerative changes  in the cervical spine without evidence for acute cervical spine fracture. 5. Significant atherosclerotic calcification of the carotid arteries. 6. LEFT pleural effusion. Recommend further evaluation with two-view chest x-ray. 7.  Aortic atherosclerosis.  (ICD10-I70.0) Electronically Signed   By: Nolon Nations M.D.   On: 02/23/2018 16:30   Ct Head Wo Contrast  Result Date: 02/09/2018 CLINICAL DATA:  Confusion and delirium with altered level of consciousness. EXAM: CT HEAD WITHOUT CONTRAST TECHNIQUE: Contiguous axial images were obtained from the base of the skull through the vertex without intravenous contrast. COMPARISON:  CT exams dating back to 07/03/2017 FINDINGS: Brain: Atrophy with chronic moderate small vessel ischemia. General calcifications and along the right parietal lobe. Remote right pontine lacunar infarct. No large vascular territory infarction, hemorrhage or midline shift. No intra-axial mass nor extra-axial fluid. Midline fourth ventricle and basal cisterns. Vascular: Vascular calcifications along both vertebral and cavernous internal carotid arteries. No hyperdense vessel sign. Skull: No acute skull fracture. Sinuses/Orbits: Bilateral lens replacements. The paranasal sinuses mastoids. Other: Dystrophic intramuscular calcifications redemonstrated along the right infratemporal fossa. IMPRESSION: 1. Atrophy with chronic moderate small vessel ischemia. Chronic right pontine lacunar infarct. 2. No acute intracranial abnormality. Electronically Signed   By: Ashley Royalty M.D.   On: 02/09/2018 20:06   Ct Angio Chest Pe W And/or Wo Contrast  Result Date: 02/04/2018 CLINICAL DATA:  Complex chest pain, intermediate clinical probability for pulmonary embolism, hypoxia, history atrial fibrillation, dementia, diabetes mellitus, hypertension EXAM: CT ANGIOGRAPHY CHEST WITH CONTRAST TECHNIQUE: Multidetector CT imaging of the chest was performed using the standard protocol during bolus administration of  intravenous contrast.  Multiplanar CT image reconstructions and MIPs were obtained to evaluate the vascular anatomy. CONTRAST:  134mL ISOVUE-370 IOPAMIDOL (ISOVUE-370) INJECTION 76% IV COMPARISON:  09/04/2017 FINDINGS: Cardiovascular: Extensive atherosclerotic calcifications aorta, proximal great vessels and coronary arteries. Ascending thoracic aorta upper normal caliber. Pulmonary arteries well opacified and patent. No evidence of pulmonary embolism. Enlargement of cardiac chambers. No pericardial effusion. Mediastinum/Nodes: Esophagus unremarkable. Base of cervical region normal appearance. No thoracic adenopathy. Lungs/Pleura: BILATERAL pleural effusions with atelectasis of the posterior lower lobes bilaterally. Remaining lungs clear though scattered respiratory motion artifacts are present. Upper Abdomen: Last image demonstrates ectasia of the abdominal aorta, noted on the 09/14/2017 exam, inadequately assessed on this study. Remaining visualized upper abdomen unremarkable. Musculoskeletal: Bones demineralized. Marked compression fracture of T12 vertebral body increased since previous exam. Review of the MIP images confirms the above findings. IMPRESSION: No evidence of pulmonary embolism. Extensive atherosclerotic calcifications of aorta, proximal great vessels and coronary arteries. Small BILATERAL pleural effusions and bibasilar atelectasis. Increased compression fracture of T12 vertebral body. Aortic Atherosclerosis (ICD10-I70.0). Electronically Signed   By: Lavonia Dana M.D.   On: 02/04/2018 13:16   Ct Cervical Spine Wo Contrast  Result Date: 02/23/2018 CLINICAL DATA:  Pt states she fell onto her forehead. EXAM: CT HEAD WITHOUT CONTRAST CT CERVICAL SPINE WITHOUT CONTRAST TECHNIQUE: Multidetector CT imaging of the head and cervical spine was performed following the standard protocol without intravenous contrast. Multiplanar CT image reconstructions of the cervical spine were also generated. COMPARISON:   None. FINDINGS: CT HEAD FINDINGS Brain: There is significant central cortical atrophy. Periventricular white matter changes are present. There is no intra or extra-axial fluid collection or mass lesion. The basilar cisterns and ventricles have a normal appearance. There is no CT evidence for acute infarction or hemorrhage. Vascular: There is dense atherosclerotic calcification of the internal carotid arteries. No hyperdense vessels. Skull: Normal. Negative for fracture or focal lesion. Sinuses/Orbits: No acute finding. Other: Partially imaged LEFT frontal scalp edema and laceration. No underlying fracture. CT CERVICAL SPINE FINDINGS Alignment: Vertebral bodies are normally aligned with preservation of the normal cervical lordosis.There is degenerative type anterolisthesis of C4 on C5 measuring 2 millimeters. Skull base and vertebrae: No acute fracture. No primary bone lesion or focal pathologic process. Soft tissues and spinal canal: No prevertebral fluid or swelling. No visible canal hematoma. There is extensive atherosclerotic calcification of the carotid arteries. Disc levels:  Mild disc height loss throughout the cervical spine. Upper chest: There is a LEFT pleural effusion. A partially imaged thoracic aorta shows significant atherosclerotic calcification. Other: None IMPRESSION: 1.  No evidence for acute intracranial abnormality. 2. Atrophy and small vessel disease. 3. LEFT frontal scalp laceration and edema. 4. Degenerative changes in the cervical spine without evidence for acute cervical spine fracture. 5. Significant atherosclerotic calcification of the carotid arteries. 6. LEFT pleural effusion. Recommend further evaluation with two-view chest x-ray. 7.  Aortic atherosclerosis.  (ICD10-I70.0) Electronically Signed   By: Nolon Nations M.D.   On: 02/23/2018 16:30   US Renal  Result Date: 02/25/2018 CLINICAL DATA:  Followup for right hydronephrosis. EXAM: RENAL / URINARY TRACT ULTRASOUND COMPLETE  COMPARISON:  CT, 02/24/2018. FINDINGS: Right Kidney: Length: 10.2 cm. Normal parenchymal echogenicity. The ureteral stent noted on the previous day's CT is not visualized. There is no hydronephrosis. 2.3 x 3.0 x 3.3 cm cyst arises from the lower pole mildly increased in size from a renal ultrasound dated 07/03/2017. No other renal masses. No visualized stones. Left Kidney: Length: 10.5 cm. Echogenicity within  normal limits. No mass or hydronephrosis visualized. Bladder: Not visualized, decompressed with a Foley catheter. IMPRESSION: 1. No acute findings.  No hydronephrosis. 2. 3.3 cm lower pole right renal cyst. Electronically Signed   By: Lajean Manes M.D.   On: 02/25/2018 12:06   Dg Chest Port 1 View  Result Date: 02/10/2018 CLINICAL DATA:  Right peripheral picc placement EXAM: PORTABLE CHEST 1 VIEW COMPARISON:  Chest x-ray dated 02/09/2018. FINDINGS: RIGHT-sided PICC line tip is still directed into the RIGHT lower neck, presumably within the internal jugular vein. Stable cardiomegaly. Overall cardiomediastinal silhouette is peers stable. Persistent opacity at the LEFT lung base, likely small pleural effusion and/or atelectasis. No new lung findings. No pneumothorax seen. IMPRESSION: 1. RIGHT-sided PICC line is again directed into the lower RIGHT neck with tip presumably within the internal jugular vein. 2. Persistent opacity at the LEFT lung base, compatible with the small LEFT pleural effusion and associated atelectasis demonstrated on earlier chest CT of 02/04/2018. Probable small residual pleural effusion at the RIGHT lung base. No new lung findings. No pneumothorax. These results will be called to the ordering clinician or representative by the Radiologist Assistant, and communication documented in the PACS or zVision Dashboard. Electronically Signed   By: Franki Cabot M.D.   On: 02/10/2018 11:30   Dg Chest Port 1 View  Result Date: 02/05/2018 CLINICAL DATA:  Hypoxia. EXAM: PORTABLE CHEST 1 VIEW  COMPARISON:  Chest CT yesterday. FINDINGS: Right upper extremity PICC tip in the proximal SVC. Again seen cardiomegaly with tortuous atherosclerotic thoracic aorta. Bilateral pleural effusions, unchanged of the left but worsened on the right. Slight vascular congestion. No pulmonary edema or pneumothorax. IMPRESSION: 1. Bilateral pleural effusions, increasing on the right and unchanged on the left. 2. Cardiomegaly with slight vascular congestion. Atherosclerotic tortuous thoracic aorta. Electronically Signed   By: Keith Rake M.D.   On: 02/05/2018 05:21   Dg Chest Port 1 View  Result Date: 02/03/2018 CLINICAL DATA:  Low O2 sats. EXAM: PORTABLE CHEST 1 VIEW COMPARISON:  February 02, 2018 FINDINGS: There is a left-sided pleural effusion with underlying opacity, similar in the interval. The probable tiny layering effusion on the right seen yesterday is not definitely seen today. A right PICC line is in good position. No pneumothorax. Stable cardiomegaly. No other change. IMPRESSION: 1. The right PICC line is in good position. 2. Left-sided pleural effusion with underlying opacity, not significantly changed. Electronically Signed   By: Dorise Bullion III M.D   On: 02/03/2018 19:31   Dg Chest Port 1 View  Result Date: 02/02/2018 CLINICAL DATA:  PICC line placement evaluation. EXAM: PORTABLE CHEST 1 VIEW COMPARISON:  Sep 04, 2017 FINDINGS: A right PICC line terminates in the central SVC. Stable cardiomegaly. Small bilateral effusions are new. No other interval changes or acute abnormalities. IMPRESSION: 1. The right PICC line terminates in the central SVC. 2. Small bilateral effusions with underlying atelectasis, new in the interval. Electronically Signed   By: Dorise Bullion III M.D   On: 02/02/2018 21:27   Dg Knee Complete 4 Views Left  Result Date: 02/03/2018 CLINICAL DATA:  Left knee pain today. EXAM: LEFT KNEE - COMPLETE 4+ VIEW COMPARISON:  Remote radiographs 12/16/2005 FINDINGS: No fracture or  dislocation. Tricompartmental osteoarthritis with peripheral spurring, progressed from remote prior exam. Lateral tibiofemoral joint space narrowing. There is tibiofemoral chondrocalcinosis. Small joint effusion. Advanced vascular calcifications. IMPRESSION: Moderate to advanced tricompartmental osteoarthritis without acute osseous abnormality. Electronically Signed   By: Aurther Loft.D.  On: 02/03/2018 23:27   Dg C-arm 1-60 Min-no Report  Result Date: 01/31/2018 Fluoroscopy was utilized by the requesting physician.  No radiographic interpretation.   Ct Renal Stone Study  Result Date: 01/30/2018 CLINICAL DATA:  Acute abdominal flank pain worse on the right EXAM: CT ABDOMEN AND PELVIS WITHOUT CONTRAST TECHNIQUE: Multidetector CT imaging of the abdomen and pelvis was performed following the standard protocol without IV contrast. COMPARISON:  09/04/2017 FINDINGS: Lower chest: Bibasilar atelectasis. Mild cardiomegaly. No pericardial or pleural effusion. Small hiatal hernia. Descending thoracic aorta is tortuous and ectatic with atherosclerosis. Hepatobiliary: No focal liver abnormality is seen. Status post cholecystectomy. No biliary dilatation. Pancreas: Unremarkable. No pancreatic ductal dilatation or surrounding inflammatory changes. Spleen: Normal in size without focal abnormality. Adrenals/Urinary Tract: Normal adrenal glands. Right kidney demonstrates acute perinephric strandy edema/inflammation. There is associated moderate right hydroureteronephrosis secondary to a proximal obstructing right ureteral calculus measuring 6 mm, image 34 series 2. There is a stable 2.5 cm right kidney lower pole hypodense cyst medially. Left kidney and ureter demonstrate no acute process or obstruction. Left ureter is nondilated. Air noted within the bladder dome.  Bladder otherwise unremarkable. Stomach/Bowel: Negative for bowel obstruction, significant dilatation, ileus, or free air. Appendix not visualized. No  fluid collection or abscess. Moderate colonic stool burden. Extensive sigmoid diverticulosis without acute inflammatory process. Vascular/Lymphatic: Atherosclerosis of the aorta. Infrarenal aorta has aneurysm dilatation measuring 3.9 cm. Extensive iliac atherosclerosis as well. No retroperitoneal hemorrhage or hematoma. No adenopathy. Reproductive: Remote hysterectomy. No other pelvic abnormality, fluid collection, ascites, or abscess. Other: No abdominal wall hernia or abnormality. No abdominopelvic ascites. Musculoskeletal: Bones are osteopenic. Degenerative changes of the spine. Extensive facet arthropathy. T12 and L5 subacute partially healing compression fractures noted when compared to 07/03/2017. IMPRESSION: Right proximal ureteral 6 mm obstructing calculus with associated right hydroureteronephrosis and perinephric/ureteral inflammation and edema. 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 Remote cholecystectomy, appendectomy and hysterectomy Colonic diverticulosis without acute inflammation or complicating feature Subacute T12 and L5 compression fractures, new since 07/03/2017 Electronically Signed   By: Jerilynn Mages.  Shick M.D.   On: 01/30/2018 22:00   [place] Ekg Site Rite  Result Date: 02/10/2018 If Site Rite image not attached, placement could not be confirmed due to current cardiac rhythm.  [place] Ekg Site Rite  Result Date: 02/02/2018 If Site Rite image not attached, placement could not be confirmed due to current cardiac rhythm.    Microbiology: Recent Results (from the past 240 hour(s))  Urine culture     Status: Abnormal (Preliminary result)   Collection Time: 02/23/18  6:46 PM  Result Value Ref Range Status   Specimen Description   Final    URINE, CATHETERIZED Performed at [facility], [address], [place], [place] [address]    Special Requests   Final    NONE Performed at [facility], [address], [place], [place] [address]     Culture (A)  Final    >=100,000 COLONIES/mL ESCHERICHIA COLI >=100,000 COLONIES/mL ENTEROCOCCUS SPECIES    Report Status PENDING  Incomplete   Organism ID, Bacteria ESCHERICHIA COLI (A)  Final      Susceptibility   Escherichia coli - MIC*    AMPICILLIN 4 SENSITIVE Sensitive     CEFAZOLIN <=4 SENSITIVE Sensitive     CEFTRIAXONE <=1 SENSITIVE Sensitive     CIPROFLOXACIN <=0.25 SENSITIVE Sensitive     GENTAMICIN <=1 SENSITIVE Sensitive     IMIPENEM <=0.25 SENSITIVE Sensitive  NITROFURANTOIN <=16 SENSITIVE Sensitive     TRIMETH/SULFA <=20 SENSITIVE Sensitive     AMPICILLIN/SULBACTAM <=2 SENSITIVE Sensitive     PIP/TAZO <=4 SENSITIVE Sensitive     Extended ESBL NEGATIVE Sensitive     * >=100,000 COLONIES/mL ESCHERICHIA COLI  MRSA PCR Screening     Status: None   Collection Time: 02/23/18  9:30 PM  Result Value Ref Range Status   MRSA by PCR NEGATIVE NEGATIVE Final    Comment:        The GeneXpert MRSA Assay (FDA approved for NASAL specimens only), is one component of a comprehensive MRSA colonization surveillance program. It is not intended to diagnose MRSA infection nor to guide or monitor treatment for MRSA infections. Performed at [facility], [address], [place], [place] [address]      Labs: Basic Metabolic Panel: Recent Labs  Lab 02/23/18 1536 02/24/18 0310 02/25/18 0656  NA 137 140 136  K 4.3 3.8 4.0  CL 101 105 100  CO2 29 28 28   GLUCOSE 119* 99 97  BUN 22 18 13   CREATININE 0.86 0.88 0.78  CALCIUM 8.6* 8.3* 8.2*   Liver Function Tests: Recent Labs  Lab 02/24/18 0310  AST 10*  ALT 7  ALKPHOS 57  BILITOT 1.1  PROT 6.1*  ALBUMIN 2.5*   No results for input(s): LIPASE, AMYLASE in the last 168 hours. No results for input(s): AMMONIA in the last 168 hours. CBC: Recent Labs  Lab 02/23/18 1536 02/24/18 0310 02/25/18 0656  WBC 10.5 7.4 6.1  NEUTROABS 7.5  --   --   HGB 10.9* 9.8* 9.7*  HCT 36.2 33.1* 32.7*  MCV 92.6 94.6 94.0  PLT 251  208 195   Cardiac Enzymes: Recent Labs  Lab 02/23/18 1956 02/24/18 0310 02/24/18 0900  TROPONINI <0.03 <0.03 <0.03   BNP: Invalid input(s): POCBNP CBG: Recent Labs  Lab 02/25/18 1127 02/25/18 1623 02/25/18 2244 02/26/18 0758 02/26/18 1126  GLUCAP 131* 97 101* 89 91    Time coordinating discharge:  36 minutes  Signed:  Orson Eva, DO [facility] Pager: [phone] 02/26/2018, 12:37 PM

## 2018-02-26 NOTE — Progress Notes (Signed)
Discharge instructions given to [name] at [facility] and [facility].

## 2018-02-27 LAB — URINE CULTURE: Culture: >=100000 — AB

## 2018-02-28 MED ORDER — GENTAMICIN SULFATE 40 MG/ML IJ SOLN
5.0000 mg/kg | INTRAVENOUS | Status: AC
  Administered 2018-03-01: 380 mg via INTRAVENOUS
  Filled 2018-02-28: qty 9.5

## 2018-03-01 ENCOUNTER — Ambulatory Visit (HOSPITAL_COMMUNITY): Payer: Medicare Other | Admitting: Anesthesiology

## 2018-03-01 ENCOUNTER — Ambulatory Visit (HOSPITAL_COMMUNITY)
Admission: RE | Admit: 2018-03-01 | Discharge: 2018-03-01 | Disposition: A | Discharge status: Home / Self Care | Payer: Medicare Other | Source: Ambulatory Visit | Attending: Urology | Admitting: Urology

## 2018-03-01 ENCOUNTER — Ambulatory Visit (HOSPITAL_COMMUNITY): Payer: Medicare Other

## 2018-03-01 ENCOUNTER — Encounter (HOSPITAL_COMMUNITY): Payer: Self-pay | Admitting: *Deleted

## 2018-03-01 ENCOUNTER — Encounter (HOSPITAL_COMMUNITY)
Admission: RE | Disposition: A | Discharge status: Home / Self Care | Payer: Self-pay | Source: Ambulatory Visit | Attending: Urology

## 2018-03-01 DIAGNOSIS — N200 Calculus of kidney: Secondary | ICD-10-CM | POA: Diagnosis not present

## 2018-03-01 DIAGNOSIS — N201 Calculus of ureter: Secondary | ICD-10-CM | POA: Diagnosis not present

## 2018-03-01 DIAGNOSIS — F0390 Unspecified dementia without behavioral disturbance: Secondary | ICD-10-CM | POA: Insufficient documentation

## 2018-03-01 DIAGNOSIS — E1151 Type 2 diabetes mellitus with diabetic peripheral angiopathy without gangrene: Secondary | ICD-10-CM | POA: Insufficient documentation

## 2018-03-01 DIAGNOSIS — M069 Rheumatoid arthritis, unspecified: Secondary | ICD-10-CM | POA: Diagnosis not present

## 2018-03-01 DIAGNOSIS — E785 Hyperlipidemia, unspecified: Secondary | ICD-10-CM | POA: Diagnosis not present

## 2018-03-01 DIAGNOSIS — Z882 Allergy status to sulfonamides status: Secondary | ICD-10-CM | POA: Insufficient documentation

## 2018-03-01 DIAGNOSIS — I10 Essential (primary) hypertension: Secondary | ICD-10-CM | POA: Diagnosis not present

## 2018-03-01 DIAGNOSIS — G4733 Obstructive sleep apnea (adult) (pediatric): Secondary | ICD-10-CM | POA: Insufficient documentation

## 2018-03-01 DIAGNOSIS — F1120 Opioid dependence, uncomplicated: Secondary | ICD-10-CM | POA: Insufficient documentation

## 2018-03-01 DIAGNOSIS — Z87442 Personal history of urinary calculi: Secondary | ICD-10-CM | POA: Insufficient documentation

## 2018-03-01 DIAGNOSIS — Z87440 Personal history of urinary (tract) infections: Secondary | ICD-10-CM | POA: Insufficient documentation

## 2018-03-01 DIAGNOSIS — Z7982 Long term (current) use of aspirin: Secondary | ICD-10-CM | POA: Insufficient documentation

## 2018-03-01 DIAGNOSIS — K5730 Diverticulosis of large intestine without perforation or abscess without bleeding: Secondary | ICD-10-CM | POA: Diagnosis not present

## 2018-03-01 DIAGNOSIS — Z9049 Acquired absence of other specified parts of digestive tract: Secondary | ICD-10-CM | POA: Insufficient documentation

## 2018-03-01 DIAGNOSIS — I714 Abdominal aortic aneurysm, without rupture: Secondary | ICD-10-CM | POA: Diagnosis not present

## 2018-03-01 DIAGNOSIS — M8580 Other specified disorders of bone density and structure, unspecified site: Secondary | ICD-10-CM | POA: Diagnosis not present

## 2018-03-01 DIAGNOSIS — Z96651 Presence of right artificial knee joint: Secondary | ICD-10-CM | POA: Insufficient documentation

## 2018-03-01 DIAGNOSIS — Z79899 Other long term (current) drug therapy: Secondary | ICD-10-CM | POA: Insufficient documentation

## 2018-03-01 DIAGNOSIS — I4891 Unspecified atrial fibrillation: Secondary | ICD-10-CM | POA: Diagnosis not present

## 2018-03-01 HISTORY — DX: Urinary calculus, unspecified: N20.9

## 2018-03-01 HISTORY — DX: Hydronephrosis with renal and ureteral calculous obstruction: N13.2

## 2018-03-01 HISTORY — DX: Hypoxemia: R09.02

## 2018-03-01 HISTORY — DX: Unspecified fracture of t11-T12 vertebra, initial encounter for closed fracture: S22.089A

## 2018-03-01 HISTORY — DX: Other specified infestations: B88.8

## 2018-03-01 HISTORY — DX: Encephalopathy, unspecified: G93.40

## 2018-03-01 HISTORY — DX: Constipation, unspecified: K59.00

## 2018-03-01 HISTORY — DX: Acute pyelonephritis: N10

## 2018-03-01 HISTORY — DX: Bacteremia: R78.81

## 2018-03-01 HISTORY — DX: Unspecified fracture of fifth lumbar vertebra, initial encounter for closed fracture: S32.059A

## 2018-03-01 HISTORY — DX: Unspecified Escherichia coli (E. coli) as the cause of diseases classified elsewhere: B96.20

## 2018-03-01 HISTORY — DX: Persistent mood (affective) disorder, unspecified: F34.9

## 2018-03-01 HISTORY — PX: CYSTOSCOPY WITH URETEROSCOPY AND STENT PLACEMENT: SHX6377

## 2018-03-01 HISTORY — DX: Gross hematuria: R31.0

## 2018-03-01 HISTORY — DX: Other idiopathic peripheral autonomic neuropathy: G90.09

## 2018-03-01 HISTORY — DX: Major depressive disorder, single episode, unspecified: F32.9

## 2018-03-01 HISTORY — DX: Urinary tract infection, site not specified: N39.0

## 2018-03-01 HISTORY — DX: Sepsis, unspecified organism: A41.9

## 2018-03-01 HISTORY — PX: HOLMIUM LASER APPLICATION: SHX5852

## 2018-03-01 HISTORY — DX: Rheumatoid arthritis, unspecified: M06.9

## 2018-03-01 HISTORY — DX: Prediabetes: R73.03

## 2018-03-01 HISTORY — DX: Disorientation, unspecified: R41.0

## 2018-03-01 HISTORY — DX: Unspecified fall, initial encounter: W19.XXXA

## 2018-03-01 HISTORY — DX: Chronic pain syndrome: G89.4

## 2018-03-01 HISTORY — DX: Acute and chronic respiratory failure with hypoxia: J96.21

## 2018-03-01 HISTORY — DX: Dysuria: R30.0

## 2018-03-01 HISTORY — DX: Edema, unspecified: R60.9

## 2018-03-01 HISTORY — DX: Unspecified urinary incontinence: R32

## 2018-03-01 HISTORY — DX: Depression, unspecified: F32.A

## 2018-03-01 LAB — GLUCOSE, CAPILLARY: Glucose-Capillary: 101 mg/dL — ABNORMAL HIGH (ref 70–99)

## 2018-03-01 SURGERY — CYSTOURETEROSCOPY, WITH STENT INSERTION
Anesthesia: General | Laterality: Right

## 2018-03-01 MED ORDER — LIDOCAINE 2% (20 MG/ML) 5 ML SYRINGE
INTRAMUSCULAR | Status: DC | PRN
  Administered 2018-03-01: 100 mg via INTRAVENOUS

## 2018-03-01 MED ORDER — PHENYLEPHRINE 40 MCG/ML (10ML) SYRINGE FOR IV PUSH (FOR BLOOD PRESSURE SUPPORT)
PREFILLED_SYRINGE | INTRAVENOUS | Status: DC | PRN
  Administered 2018-03-01: 80 ug via INTRAVENOUS
  Administered 2018-03-01 (×2): 120 ug via INTRAVENOUS
  Administered 2018-03-01: 80 ug via INTRAVENOUS

## 2018-03-01 MED ORDER — PROPOFOL 10 MG/ML IV BOLUS
INTRAVENOUS | Status: AC
  Filled 2018-03-01: qty 20

## 2018-03-01 MED ORDER — DEXAMETHASONE SODIUM PHOSPHATE 10 MG/ML IJ SOLN
INTRAMUSCULAR | Status: DC | PRN
  Administered 2018-03-01: 10 mg via INTRAVENOUS

## 2018-03-01 MED ORDER — FENTANYL CITRATE (PF) 100 MCG/2ML IJ SOLN
INTRAMUSCULAR | Status: AC
  Filled 2018-03-01: qty 2

## 2018-03-01 MED ORDER — OXYCODONE HCL 5 MG PO TABS: 5.0000 mg | ORAL_TABLET | Freq: Once | ORAL | Status: DC | PRN

## 2018-03-01 MED ORDER — IOHEXOL 300 MG/ML  SOLN
INTRAMUSCULAR | Status: DC | PRN
  Administered 2018-03-01: 6 mL via URETHRAL

## 2018-03-01 MED ORDER — PROPOFOL 10 MG/ML IV BOLUS
INTRAVENOUS | Status: DC | PRN
  Administered 2018-03-01: 80 mg via INTRAVENOUS

## 2018-03-01 MED ORDER — LIDOCAINE 2% (20 MG/ML) 5 ML SYRINGE
INTRAMUSCULAR | Status: AC
  Filled 2018-03-01: qty 5

## 2018-03-01 MED ORDER — SODIUM CHLORIDE 0.9 % IR SOLN
Status: DC | PRN
  Administered 2018-03-01: 3000 mL via INTRAVESICAL

## 2018-03-01 MED ORDER — AMOXICILLIN 500 MG PO CAPS: 500.0000 mg | ORAL_CAPSULE | Freq: Three times a day (TID) | ORAL | 0 refills | Status: AC

## 2018-03-01 MED ORDER — OXYCODONE HCL 5 MG/5ML PO SOLN
5.0000 mg | Freq: Once | ORAL | Status: DC | PRN
  Filled 2018-03-01: qty 5

## 2018-03-01 MED ORDER — FENTANYL CITRATE (PF) 100 MCG/2ML IJ SOLN
INTRAMUSCULAR | Status: DC | PRN
  Administered 2018-03-01 (×2): 25 ug via INTRAVENOUS

## 2018-03-01 MED ORDER — FENTANYL CITRATE (PF) 100 MCG/2ML IJ SOLN: 25.0000 ug | INTRAMUSCULAR | Status: DC | PRN

## 2018-03-01 MED ORDER — ONDANSETRON HCL 4 MG/2ML IJ SOLN
INTRAMUSCULAR | Status: DC | PRN
  Administered 2018-03-01: 4 mg via INTRAVENOUS

## 2018-03-01 MED ORDER — ONDANSETRON HCL 4 MG/2ML IJ SOLN: 4.0000 mg | Freq: Once | INTRAMUSCULAR | Status: DC | PRN

## 2018-03-01 MED ORDER — LACTATED RINGERS IV SOLN
INTRAVENOUS | Status: DC
  Administered 2018-03-01: 10:00:00 via INTRAVENOUS

## 2018-03-01 MED ORDER — SODIUM CHLORIDE 0.9 % IV SOLN
INTRAVENOUS | Status: DC | PRN
  Administered 2018-03-01: 20 ug/min via INTRAVENOUS

## 2018-03-01 MED ORDER — PHENYLEPHRINE 40 MCG/ML (10ML) SYRINGE FOR IV PUSH (FOR BLOOD PRESSURE SUPPORT)
PREFILLED_SYRINGE | INTRAVENOUS | Status: AC
  Filled 2018-03-01: qty 10

## 2018-03-01 SURGICAL SUPPLY — 28 items
BAG URINE DRAINAGE (UROLOGICAL SUPPLIES) ×2 IMPLANT
BAG URO CATCHER STRL LF (MISCELLANEOUS) ×2 IMPLANT
BASKET ZERO TIP NITINOL 2.4FR (BASKET) IMPLANT
CATH FOLEY 2WAY SLVR  5CC 16FR (CATHETERS) ×1
CATH FOLEY 2WAY SLVR 5CC 16FR (CATHETERS) ×1 IMPLANT
CATH URET 5FR 28IN OPEN ENDED (CATHETERS) ×2 IMPLANT
CLOTH BEACON ORANGE TIMEOUT ST (SAFETY) ×2 IMPLANT
COVER SURGICAL LIGHT HANDLE (MISCELLANEOUS) ×2 IMPLANT
COVER WAND RF STERILE (DRAPES) IMPLANT
EXTRACTOR STONE 1.7FRX115CM (UROLOGICAL SUPPLIES) IMPLANT
EXTRACTOR STONE NITINOL NGAGE (UROLOGICAL SUPPLIES) ×2 IMPLANT
FIBER LASER FLEXIVA 1000 (UROLOGICAL SUPPLIES) IMPLANT
FIBER LASER FLEXIVA 365 (UROLOGICAL SUPPLIES) IMPLANT
FIBER LASER FLEXIVA 550 (UROLOGICAL SUPPLIES) IMPLANT
FIBER LASER TRAC TIP (UROLOGICAL SUPPLIES) IMPLANT
GLOVE BIOGEL M STRL SZ7.5 (GLOVE) ×2 IMPLANT
GOWN STRL REUS W/TWL XL LVL3 (GOWN DISPOSABLE) ×2 IMPLANT
GUIDEWIRE ANG ZIPWIRE 038X150 (WIRE) IMPLANT
GUIDEWIRE STR DUAL SENSOR (WIRE) ×4 IMPLANT
HOLDER FOLEY CATH W/STRAP (MISCELLANEOUS) ×2 IMPLANT
MANIFOLD NEPTUNE II (INSTRUMENTS) ×2 IMPLANT
PACK CYSTO (CUSTOM PROCEDURE TRAY) ×2 IMPLANT
SHEATH URETERAL 12FRX28CM (UROLOGICAL SUPPLIES) IMPLANT
SHEATH URETERAL 12FRX35CM (MISCELLANEOUS) IMPLANT
TUBING CONNECTING 10 (TUBING) ×2 IMPLANT
TUBING UROLOGY SET (TUBING) ×2 IMPLANT
WATER STERILE IRR 250ML POUR (IV SOLUTION) ×2 IMPLANT
WIRE COONS/BENSON .038X145CM (WIRE) IMPLANT

## 2018-03-01 NOTE — Anesthesia Preprocedure Evaluation (Addendum)
Anesthesia Evaluation  Patient identified by MRN, date of birth, ID band Patient confused    Reviewed: Allergy & Precautions, NPO status , Patient's Chart, lab work & pertinent test results  History of Anesthesia Complications Negative for: history of anesthetic complications  Airway Mallampati: II  TM Distance: >3 FB Neck ROM: Full    Dental  (+) Edentulous Upper, Edentulous Lower   Pulmonary sleep apnea and Continuous Positive Airway Pressure Ventilation ,    Pulmonary exam normal        Cardiovascular hypertension, Pt. on medications and Pt. on home beta blockers Normal cardiovascular exam+ dysrhythmias (no AC) Atrial Fibrillation  Rhythm:Irregular     Neuro/Psych PSYCHIATRIC DISORDERS Depression Dementia  Neuromuscular disease (peripheral neuropathy)    GI/Hepatic negative GI ROS, (+)     substance abuse (chronic pain managed with opana)  ,   Endo/Other  diabetes  Renal/GU Renal disease (hydronephrosis 2/2 ureteral obstruction)  negative genitourinary   Musculoskeletal  (+) Arthritis , Rheumatoid disorders,  narcotic dependent  Abdominal   Peds  Hematology negative hematology ROS (+)   Anesthesia Other Findings   Reproductive/Obstetrics negative OB ROS                           Anesthesia Physical Anesthesia Plan  ASA: III  Anesthesia Plan: General   Post-op Pain Management:    Induction: Intravenous  PONV Risk Score and Plan: 3 and Ondansetron, Dexamethasone and Treatment may vary due to age or medical condition  Airway Management Planned: LMA  Additional Equipment: None  Intra-op Plan:   Post-operative Plan: Extubation in OR  Informed Consent: I have reviewed the patients History and Physical, chart, labs and discussed the procedure including the risks, benefits and alternatives for the proposed anesthesia with the patient or authorized representative who has indicated  his/her understanding and acceptance.   Consent reviewed with POA  Plan Discussed with:   Anesthesia Plan Comments: (History and plan discussed with daughter and son at bedside due to patient's sedation and dementia.)       Anesthesia Quick Evaluation

## 2018-03-01 NOTE — Discharge Instructions (Signed)
DISCHARGE INSTRUCTIONS FOR KIDNEY STONE/URETERAL STENT   MEDICATIONS:  1.  Resume all your other meds from home - except do not take any extra narcotic pain meds that you may have at home.  2. Continue Amoxicillin three times daily x 1 week (until stent is removed.).  ACTIVITY:  1. No strenuous activity x 1week  2. No driving while on narcotic pain medications  3. Drink plenty of water  4. Continue to walk at home - you can still get blood clots when you are at home, so keep active, but don't over do it.  5. May return to work/school tomorrow or when you feel ready   BATHING:  1. You can shower and we recommend daily showers  SIGNS/SYMPTOMS TO CALL:  Please call us if you have a fever greater than 101.5, uncontrolled nausea/vomiting, uncontrolled pain, dizziness, unable to urinate, bloody urine, chest pain, shortness of breath, leg swelling, leg pain, redness around wound, drainage from wound, or any other concerns or questions.   You can reach [place] at [phone].   FOLLOW-UP:  As scheduled next week for stent removal.

## 2018-03-01 NOTE — Anesthesia Postprocedure Evaluation (Signed)
Anesthesia Post Note  Patient: [name]  Procedure(s) Performed: CYSTOSCPY/RIGHT URETEROSCOPY HOLMIUM LASER LITHOTRIPSY AND STENT EXCHANGE (Right ) HOLMIUM LASER APPLICATION (Right )     Patient location during evaluation: PACU Anesthesia Type: General Level of consciousness: sedated Pain management: pain level controlled Vital Signs Assessment: post-procedure vital signs reviewed and stable Respiratory status: spontaneous breathing, nonlabored ventilation, respiratory function stable and patient connected to nasal cannula oxygen Cardiovascular status: blood pressure returned to baseline and stable Postop Assessment: no apparent nausea or vomiting Anesthetic complications: no    Last Vitals:  Vitals:   03/01/18 1449 03/01/18 1450  BP: 129/78   Pulse:  94  Resp: 20   Temp:    SpO2:  92%    Last Pain:  Vitals:   03/01/18 0944  TempSrc: Oral                 Evelyne Makepeace,E. [name]

## 2018-03-01 NOTE — Op Note (Signed)
Preoperative diagnosis: right renal calculus  Postoperative diagnosis: right renal calculus  Procedure:  1. Cystoscopy 2. right ureteroscopy and stone removal - did not send to path 3. Ureteroscopic laser lithotripsy 4. right 45F x 24 ureteral stent placement  5. right retrograde pyelography with interpretation  Surgeon: Ardis Hughs, MD  Anesthesia: General  Complications: None  Intraoperative findings: right retrograde pyelography demonstrated a normal caliber right ureter with no filling defect or abnormality. The renal pelvis demonstrated sharp calyces, no hydronephrosis, and with no appreciable filling defect.  EBL: Minimal  Specimens: 1. None  Disposition of specimens: [facility] for stone analysis  Indication: [name] is a 84 y.o.   patient with right ureteral stone recently stented and treated for urosepsis. After reviewing the management options for treatment, the patient elected to proceed with the above surgical procedure(s). We have discussed the potential benefits and risks of the procedure, side effects of the proposed treatment, the likelihood of the patient achieving the goals of the procedure, and any potential problems that might occur during the procedure or recuperation. Informed consent has been obtained.   Description of procedure:  The patient was taken to the operating room and general anesthesia was induced.  The patient was placed in the dorsal lithotomy position, prepped and draped in the usual sterile fashion, and preoperative antibiotics were administered. A preoperative time-out was performed.   Cystourethroscopy was performed.  The patient's urethra was examined and was  Normal.2 The bladder was then systematically examined in its entirety. There was no evidence for any bladder tumors, stones, or other mucosal pathology.    The stent was pulled to the urethral meatus.  A 0.38 sensor wire was then advanced in the stent up  into the right ureteral orifice removing stent over the wire.  I then advanced a semirigid ureteroscope up to the proximal ureter, but was unable to advance all the stone.  I then removed this scope and advanced a flexible ureteroscope through the urethra and into the right ureter under visual guidance.  I was able to advance up to the proximal ureter where the stone was encountered.  There was some bogginess within the upper tract and such I used a 10 cc syringe to irrigate out the clots and debris.  Then found the stone again in the upper pole.  I tried using an engage basket and removing it without laser fragmentation unsuccessfully.  I then decided to advance the stone up to the upper pole and fragmented into pieces that were passable.  I did remove one small stone fragment but ultimately opted not to send it as it would not change management.  I then slowly backed out the ureteroscope under visual guidance noting no significant abnormality.  I then exchanged the sensor wire for an open-ended catheter and performed retrograde pyelogram with the above findings.  I then re-placed in the sensor wire in the upper pole of the right kidney.  I advanced a 24 cm x 6 French double-J ureteral stent over the wire and into the right kidney under fluoroscopic guidance.  I advanced the stent to the urethral meatus and then slowly pulled out the wire.  I then advanced a 36 French Foley catheter into the patient's urethra.  Stent tether was left off.  Fluoroscopy confirmed adequate position of the stent within the kidney as well as in the bladder.  At this point terminated.    The bladder was then emptied and the procedure ended.  The patient appeared to tolerate the procedure well and without complications.  The patient was able to be awakened and transferred to the recovery unit in satisfactory condition.   Disposition: The patient will follow-up in 1 week for stent removal.

## 2018-03-01 NOTE — Transfer of Care (Signed)
Immediate Anesthesia Transfer of Care Note  Patient: [name]  Procedure(s) Performed: CYSTOSCPY/RIGHT URETEROSCOPY HOLMIUM LASER LITHOTRIPSY AND STENT EXCHANGE (Right ) HOLMIUM LASER APPLICATION (Right )  Patient Location: PACU  Anesthesia Type:General  Level of Consciousness: sedated  Airway & Oxygen Therapy: Patient Spontanous Breathing and Patient connected to face mask oxygen  Post-op Assessment: Report given to RN and Post -op Vital signs reviewed and stable  Post vital signs: Reviewed and stable  Last Vitals:  Vitals Value Taken Time  BP    Temp    Pulse    Resp 13 03/01/2018  2:05 PM  SpO2    Vitals shown include unvalidated device data.  Last Pain:  Vitals:   03/01/18 0944  TempSrc: Oral         Complications: No apparent anesthesia complications

## 2018-03-01 NOTE — Progress Notes (Signed)
Per family, Dr. Louis Meckel advised that foley is to be left in place until seen in office for removal of stent

## 2018-03-01 NOTE — Anesthesia Procedure Notes (Signed)
Procedure Name: LMA Insertion Date/Time: 03/01/2018 1:02 PM Performed by: Lind Covert, CRNA Pre-anesthesia Checklist: Patient identified, Emergency Drugs available, Suction available, Patient being monitored and Timeout performed Patient Re-evaluated:Patient Re-evaluated prior to induction Oxygen Delivery Method: Circle system utilized Preoxygenation: Pre-oxygenation with 100% oxygen Induction Type: IV induction LMA: LMA inserted LMA Size: 3.0 Tube type: Oral Number of attempts: 1 Placement Confirmation: positive ETCO2 and breath sounds checked- equal and bilateral Tube secured with: Tape Dental Injury: Teeth and Oropharynx as per pre-operative assessment

## 2018-03-01 NOTE — Interval H&P Note (Signed)
History and Physical Interval Note:  03/01/2018 12:39 PM  [name]  has presented today for surgery, with the diagnosis of RIGHT URETERAL STONE  The various methods of treatment have been discussed with the patient and family. After consideration of risks, benefits and other options for treatment, the patient has consented to  Procedure(s): RIGHT URETEROSCOPY HOLMIUM LASER LITHOTRIPSY AND STENT EXCHANGE (Right) HOLMIUM LASER APPLICATION (Right) as a surgical intervention .  The patient's history has been reviewed, patient examined, no change in status, stable for surgery.  I have reviewed the patient's chart and labs.  Questions were answered to the patient's satisfaction.     Louis Meckel W

## 2018-03-01 NOTE — Addendum Note (Signed)
Addendum  created 03/01/18 1535 by West Pugh, CRNA   Intraprocedure Meds edited

## 2018-03-02 ENCOUNTER — Encounter (HOSPITAL_COMMUNITY): Payer: Self-pay | Admitting: Urology

## 2018-03-05 NOTE — Progress Notes (Signed)
HPI: FU atrial fibrillation and abnormal functional study. I intitially saw in January of 2013 for an abnormal electrocardiogram. She had documented dynamic anterior T-wave changes. Myoview was performed in January 2013 and showed mild reversible defect at the apex. This was felt to possibly represent ischemia. Ejection fraction was 77%. I reviewed and felt low risk. Treated medically. Carotid Dopplers September 2018 showed less than 50% bilateral stenosis.  Last echocardiogram October 2019 showed normal LV function and mild biatrial enlargement.  Abdominal CT October 2019 showed 3.9 cm abdominal aortic aneurysm.  Patient was seen with atrial fibrillation March 2019.  She has fallen several times and therefore anticoagulation discontinued.  Patient has had multiple recent admissions after falls and with encephalopathy; also with renal calculi.  Since I last saw her, there is no dyspnea, chest pain, palpitations or syncope.  She has fallen again.  Current Outpatient Medications  Medication Sig Dispense Refill  . acetaminophen (TYLENOL) 500 MG tablet Take 500 mg by mouth every 8 (eight) hours as needed.    Marland Kitchen aspirin EC 81 MG tablet Take 81 mg by mouth daily.    . Cholecalciferol (VITAMIN D3) 2000 units TABS Take 2,000 Units by mouth daily.    . DULoxetine (CYMBALTA) 30 MG capsule Take 90 mg by mouth daily.   2  . gabapentin (NEURONTIN) 100 MG capsule Take 2 capsules (200 mg total) by mouth at bedtime. 120 capsule 2  . Melatonin 3 MG TABS Take 3 mg by mouth at bedtime.    . metoprolol succinate (TOPROL-XL) 50 MG 24 hr tablet Take 1 tablet (50 mg total) by mouth daily. Take with or immediately following a meal. 30 tablet 0  . nystatin (MYCOSTATIN/NYSTOP) powder Apply topically 2 (two) times daily.    Marland Kitchen oxymorphone (OPANA) 5 MG tablet Take 1 tablet (5 mg total) by mouth every 8 (eight) hours as needed for pain. 5 tablet 0  . QUEtiapine (SEROQUEL) 25 MG tablet Take 1 tablet (25 mg total) by mouth at  bedtime. 30 tablet 0  . rosuvastatin (CRESTOR) 40 MG tablet TAKE 1 TABLET BY MOUTH EVERY DAY (Patient taking differently: Take 40 mg by mouth every evening. ) 90 tablet 3   No current facility-administered medications for this visit.      Past Medical History:  Diagnosis Date  . [name] (abdominal aortic aneurysm) ([place]) 09/2013   3.6 cm  . Acute and chronic respiratory failure with hypoxia ([place])   . Acute delirium   . Acute encephalopathy   . Acute pyelonephritis   . Atrial fibrillation ([place])   . Chronic pain syndrome   . Constipation   . Dementia ([place])   . Dependent edema    bilateral legs   . Depression   . Diabetes mellitus    pre-diabetes  . Diverticulosis   . Diverticulosis   . Dysuria   . E coli bacteremia   . Fall   . Gross hematuria   . Hydronephrosis with renal and ureteral calculous obstruction   . Hyperlipidemia   . Hypertension   . Hypoxemia   . Idiopathic peripheral autonomic neuropathy   . Infestation by bed bug   . Nephrolithiasis   . OSA on CPAP   . Osteoarthritis   . Osteopenia    T=  -2.1 in hip  . Persistent mood (affective) disorder, unspecified ([place])   . Prediabetes   . Rheumatoid arthritis ([place])   . Rheumatoid arthritis(714.0)   . S/P carpal tunnel release   .  Sepsis secondary to UTI ([place])   . Spinal stenosis   . Unspecified fracture of fifth lumbar vertebra, initial encounter for closed fracture ([place])   . Unspecified fracture of t11-T12 vertebra, initial encounter for closed fracture ([place])   . Urinary incontinence   . Urinary tract infection    hx of   . Urolithiasis     Past Surgical History:  Procedure Laterality Date  . APPENDECTOMY    . CARPAL TUNNEL RELEASE  12/27/2006   Right subcutaneous ulnar nerve transfer -- Right open carpal tunnel release  . CHOLECYSTECTOMY    . CYSTOSCOPY W/ URETERAL STENT PLACEMENT Right 01/30/2018   Procedure: CYSTOSCOPY WITH STENT REPLACEMENT retrograde pylegram;  Surgeon: Ardis Hughs, MD;   Location: [facility];  Service: Urology;  Laterality: Right;  . CYSTOSCOPY WITH URETEROSCOPY AND STENT PLACEMENT Right 03/01/2018   Procedure: CYSTOSCPY/RIGHT URETEROSCOPY HOLMIUM LASER LITHOTRIPSY AND STENT EXCHANGE;  Surgeon: Ardis Hughs, MD;  Location: [facility];  Service: Urology;  Laterality: Right;  . FOOT SURGERY    . HOLMIUM LASER APPLICATION Right 30/86/5784   Procedure: HOLMIUM LASER APPLICATION;  Surgeon: Ardis Hughs, MD;  Location: [facility];  Service: Urology;  Laterality: Right;  . JOINT REPLACEMENT     BTKR, RTHR  . TOTAL KNEE ARTHROPLASTY  08/21/2007   Right total knee replacement    Social History   Socioeconomic History  . Marital status: Divorced    Spouse name: Not on file  . Number of children: 6  . Years of education: Not on file  . Highest education level: Not on file  Occupational History  . Not on file  Social Needs  . Financial resource strain: Not on file  . Food insecurity:    Worry: Not on file    Inability: Not on file  . Transportation needs:    Medical: Not on file    Non-medical: Not on file  Tobacco Use  . Smoking status: Never Smoker  . Smokeless tobacco: Never Used  Substance and Sexual Activity  . Alcohol use: No  . Drug use: No  . Sexual activity: Not on file  Lifestyle  . Physical activity:    Days per week: Not on file    Minutes per session: Not on file  . Stress: Not on file  Relationships  . Social connections:    Talks on phone: Not on file    Gets together: Not on file    Attends religious service: Not on file    Active member of club or organization: Not on file    Attends meetings of clubs or organizations: Not on file    Relationship status: Not on file  . Intimate partner violence:    Fear of current or ex partner: Not on file    Emotionally abused: Not on file    Physically abused: Not on file    Forced sexual activity: Not on file  Other Topics Concern  . Not on file  Social History Narrative  . Not on  file    Family History  Problem Relation Age of Onset  . Stroke Father   . Coronary artery disease Son        CABG at age 34  . Heart disease Son   . Heart attack Son   . Stroke Mother   . Hypertension Sister   . Diabetes Brother   . Hypertension Maternal Aunt     ROS: no fevers or chills, productive cough, hemoptysis, dysphasia, odynophagia,  melena, hematochezia, dysuria, hematuria, rash, seizure activity, orthopnea, PND, pedal edema, claudication. Remaining systems are negative.  Physical Exam: Well-developed well-nourished in no acute distress.  Skin is warm and dry.  HEENT ecchymosis over left eye Neck is supple.  Chest is clear to auscultation with normal expansion.  Cardiovascular exam is irregular  Abdominal exam nontender or distended. No masses palpated. Extremities show no edema. neuro grossly intact  ECG-atrial fibrillation at a rate of 90, cannot rule out septal infarct.  Personally reviewed  A/P  1 previous abnormal nuclear study-patient is not having chest pain and we felt previously that abnormality may be secondary to shifting breast attenuation.  Given that she remains asymptomatic we will continue with medical therapy.  Continue aspirin and statin.  2 paroxysmal atrial fibrillation-patient is in atrial fibrillation today.  Given multiple recurrent falls she is not a candidate for anticoagulation.  Continue aspirin.  Continue Toprol for rate control.  3 hyperlipidemia-continue statin.  4 hypertension-blood pressure is controlled.  Continue present medications and follow.  5 abdominal aortic aneurysm-plan follow-up ultrasound October 2020.  6 frequent falls-I have recommended that she continue to use her walker and that she have assistance with ambulation.  Kirk Ruths, MD

## 2018-03-08 DIAGNOSIS — N201 Calculus of ureter: Secondary | ICD-10-CM | POA: Diagnosis not present

## 2018-03-09 DIAGNOSIS — F0151 Vascular dementia with behavioral disturbance: Secondary | ICD-10-CM | POA: Diagnosis not present

## 2018-03-09 DIAGNOSIS — M25562 Pain in left knee: Secondary | ICD-10-CM | POA: Diagnosis not present

## 2018-03-09 DIAGNOSIS — I480 Paroxysmal atrial fibrillation: Secondary | ICD-10-CM | POA: Diagnosis not present

## 2018-03-12 ENCOUNTER — Other Ambulatory Visit: Payer: Self-pay | Admitting: Licensed Clinical Social Worker

## 2018-03-12 NOTE — Patient Outreach (Signed)
Assessment:  CSW spoke via phone with client on 03/12/18. CSW verified client identity. CSW received verbal permission from client for CSW to speak with client about client needs. Client is receiving nursing care and physical therapy support at [facility].  CSW has talked with Audelia Acton, client's son, about bed bug home treatment estimates.  CSW Theadore Nan has also talked with client's facility social worker about long term Medicaid application for client. Client reported that she is receiving physical therapy as scheduled at facility. . She did have a recent procedure completed at the hospital. She is not sure how long she will remain at facility. Al Pimple, facility social worker, has said that she will talk with client and son of client regarding long term Medicaid application for client.  CSW has provided client with [facility] CSW card and encouraged Brittanni or her son, Audelia Acton, to call CSW at [phone] as needed to discuss social work needs of client.  CSW Theadore Nan has encouraged client and her son, Audelia Acton, to talk with facility social worker regarding discharge plans for client.   Plan:  CSW to call client or Audelia Acton, son of client, in 4 weeks to assess client needs.  Norva Riffle.Jamyra Zweig MSW, LCSW Licensed Clinical Social Worker [facility] Care Management [phone]                   Plan:

## 2018-03-14 ENCOUNTER — Ambulatory Visit (INDEPENDENT_AMBULATORY_CARE_PROVIDER_SITE_OTHER): Payer: Medicare Other | Admitting: Cardiology

## 2018-03-14 ENCOUNTER — Encounter: Payer: Self-pay | Admitting: Cardiology

## 2018-03-14 VITALS — BP 130/94 | HR 90 | Ht 60.0 in | Wt 157.0 lb

## 2018-03-14 DIAGNOSIS — I6381 Other cerebral infarction due to occlusion or stenosis of small artery: Secondary | ICD-10-CM

## 2018-03-14 DIAGNOSIS — I10 Essential (primary) hypertension: Secondary | ICD-10-CM

## 2018-03-14 DIAGNOSIS — I4819 Other persistent atrial fibrillation: Secondary | ICD-10-CM | POA: Diagnosis not present

## 2018-03-14 DIAGNOSIS — I714 Abdominal aortic aneurysm, without rupture, unspecified: Secondary | ICD-10-CM

## 2018-03-14 NOTE — Patient Instructions (Signed)
Medication Instructions:  Your physician recommends that you continue on your current medications as directed. Please refer to the Current Medication list given to you today.  If you need a refill on your cardiac medications before your next appointment, please call your pharmacy.    Follow-Up: At [facility], you and your health needs are our priority.  As part of our continuing mission to provide you with exceptional heart care, we have created designated Provider Care Teams.  These Care Teams include your primary Cardiologist (physician) and Advanced Practice Providers (APPs -  Physician Assistants and Nurse Practitioners) who all work together to provide you with the care you need, when you need it. You will need a follow up appointment in 6 months.  Please call our office 2 months in advance to schedule this appointment.  You may see Brian Crenshaw, MD or one of the following Advanced Practice Providers on your designated Care Team:   Luke Kilroy, PA-C Krista Kroeger, PA-C . Callie Goodrich, PA-C    

## 2018-03-20 DIAGNOSIS — G9009 Other idiopathic peripheral autonomic neuropathy: Secondary | ICD-10-CM | POA: Diagnosis not present

## 2018-03-20 DIAGNOSIS — M1712 Unilateral primary osteoarthritis, left knee: Secondary | ICD-10-CM | POA: Diagnosis not present

## 2018-03-20 DIAGNOSIS — I10 Essential (primary) hypertension: Secondary | ICD-10-CM | POA: Diagnosis not present

## 2018-03-20 DIAGNOSIS — M8008XD Age-related osteoporosis with current pathological fracture, vertebra(e), subsequent encounter for fracture with routine healing: Secondary | ICD-10-CM | POA: Diagnosis not present

## 2018-03-20 DIAGNOSIS — F0391 Unspecified dementia with behavioral disturbance: Secondary | ICD-10-CM | POA: Diagnosis not present

## 2018-03-20 DIAGNOSIS — I482 Chronic atrial fibrillation, unspecified: Secondary | ICD-10-CM | POA: Diagnosis not present

## 2018-03-21 DIAGNOSIS — I482 Chronic atrial fibrillation, unspecified: Secondary | ICD-10-CM | POA: Diagnosis not present

## 2018-03-21 DIAGNOSIS — F0391 Unspecified dementia with behavioral disturbance: Secondary | ICD-10-CM | POA: Diagnosis not present

## 2018-03-21 DIAGNOSIS — G9009 Other idiopathic peripheral autonomic neuropathy: Secondary | ICD-10-CM | POA: Diagnosis not present

## 2018-03-21 DIAGNOSIS — M1712 Unilateral primary osteoarthritis, left knee: Secondary | ICD-10-CM | POA: Diagnosis not present

## 2018-03-21 DIAGNOSIS — M8008XD Age-related osteoporosis with current pathological fracture, vertebra(e), subsequent encounter for fracture with routine healing: Secondary | ICD-10-CM | POA: Diagnosis not present

## 2018-03-21 DIAGNOSIS — I10 Essential (primary) hypertension: Secondary | ICD-10-CM | POA: Diagnosis not present

## 2018-03-22 DIAGNOSIS — M1712 Unilateral primary osteoarthritis, left knee: Secondary | ICD-10-CM | POA: Diagnosis not present

## 2018-03-22 DIAGNOSIS — G9009 Other idiopathic peripheral autonomic neuropathy: Secondary | ICD-10-CM | POA: Diagnosis not present

## 2018-03-22 DIAGNOSIS — M8008XD Age-related osteoporosis with current pathological fracture, vertebra(e), subsequent encounter for fracture with routine healing: Secondary | ICD-10-CM | POA: Diagnosis not present

## 2018-03-22 DIAGNOSIS — I482 Chronic atrial fibrillation, unspecified: Secondary | ICD-10-CM | POA: Diagnosis not present

## 2018-03-22 DIAGNOSIS — I10 Essential (primary) hypertension: Secondary | ICD-10-CM | POA: Diagnosis not present

## 2018-03-22 DIAGNOSIS — F0391 Unspecified dementia with behavioral disturbance: Secondary | ICD-10-CM | POA: Diagnosis not present

## 2018-03-23 DIAGNOSIS — I10 Essential (primary) hypertension: Secondary | ICD-10-CM | POA: Diagnosis not present

## 2018-03-23 DIAGNOSIS — M1712 Unilateral primary osteoarthritis, left knee: Secondary | ICD-10-CM | POA: Diagnosis not present

## 2018-03-23 DIAGNOSIS — I482 Chronic atrial fibrillation, unspecified: Secondary | ICD-10-CM | POA: Diagnosis not present

## 2018-03-23 DIAGNOSIS — M8008XD Age-related osteoporosis with current pathological fracture, vertebra(e), subsequent encounter for fracture with routine healing: Secondary | ICD-10-CM | POA: Diagnosis not present

## 2018-03-23 DIAGNOSIS — G9009 Other idiopathic peripheral autonomic neuropathy: Secondary | ICD-10-CM | POA: Diagnosis not present

## 2018-03-23 DIAGNOSIS — F0391 Unspecified dementia with behavioral disturbance: Secondary | ICD-10-CM | POA: Diagnosis not present

## 2018-03-26 ENCOUNTER — Inpatient Hospital Stay: Payer: Medicare Other | Admitting: Family Medicine

## 2018-03-26 DIAGNOSIS — F0391 Unspecified dementia with behavioral disturbance: Secondary | ICD-10-CM | POA: Diagnosis not present

## 2018-03-26 DIAGNOSIS — G9009 Other idiopathic peripheral autonomic neuropathy: Secondary | ICD-10-CM | POA: Diagnosis not present

## 2018-03-26 DIAGNOSIS — I482 Chronic atrial fibrillation, unspecified: Secondary | ICD-10-CM | POA: Diagnosis not present

## 2018-03-26 DIAGNOSIS — M8008XD Age-related osteoporosis with current pathological fracture, vertebra(e), subsequent encounter for fracture with routine healing: Secondary | ICD-10-CM | POA: Diagnosis not present

## 2018-03-26 DIAGNOSIS — M1712 Unilateral primary osteoarthritis, left knee: Secondary | ICD-10-CM | POA: Diagnosis not present

## 2018-03-26 DIAGNOSIS — I10 Essential (primary) hypertension: Secondary | ICD-10-CM | POA: Diagnosis not present

## 2018-03-27 ENCOUNTER — Emergency Department (HOSPITAL_COMMUNITY): Payer: Medicare Other

## 2018-03-27 ENCOUNTER — Emergency Department (HOSPITAL_COMMUNITY)
Admission: EM | Admit: 2018-03-27 | Discharge: 2018-03-27 | Disposition: A | Discharge status: Home / Self Care | Payer: Medicare Other | Attending: Emergency Medicine | Admitting: Emergency Medicine

## 2018-03-27 DIAGNOSIS — I959 Hypotension, unspecified: Secondary | ICD-10-CM | POA: Diagnosis not present

## 2018-03-27 DIAGNOSIS — E860 Dehydration: Secondary | ICD-10-CM

## 2018-03-27 DIAGNOSIS — R000 Tachycardia, unspecified: Secondary | ICD-10-CM | POA: Diagnosis not present

## 2018-03-27 DIAGNOSIS — I10 Essential (primary) hypertension: Secondary | ICD-10-CM | POA: Insufficient documentation

## 2018-03-27 DIAGNOSIS — F0390 Unspecified dementia without behavioral disturbance: Secondary | ICD-10-CM | POA: Diagnosis not present

## 2018-03-27 DIAGNOSIS — Z96651 Presence of right artificial knee joint: Secondary | ICD-10-CM | POA: Diagnosis not present

## 2018-03-27 DIAGNOSIS — I4891 Unspecified atrial fibrillation: Secondary | ICD-10-CM | POA: Diagnosis not present

## 2018-03-27 DIAGNOSIS — E119 Type 2 diabetes mellitus without complications: Secondary | ICD-10-CM | POA: Insufficient documentation

## 2018-03-27 DIAGNOSIS — Z79899 Other long term (current) drug therapy: Secondary | ICD-10-CM | POA: Diagnosis not present

## 2018-03-27 DIAGNOSIS — S0003XA Contusion of scalp, initial encounter: Secondary | ICD-10-CM | POA: Diagnosis not present

## 2018-03-27 DIAGNOSIS — R4182 Altered mental status, unspecified: Secondary | ICD-10-CM | POA: Diagnosis present

## 2018-03-27 DIAGNOSIS — R509 Fever, unspecified: Secondary | ICD-10-CM | POA: Diagnosis not present

## 2018-03-27 DIAGNOSIS — Z7982 Long term (current) use of aspirin: Secondary | ICD-10-CM | POA: Diagnosis not present

## 2018-03-27 DIAGNOSIS — R0902 Hypoxemia: Secondary | ICD-10-CM | POA: Diagnosis not present

## 2018-03-27 DIAGNOSIS — S22080A Wedge compression fracture of T11-T12 vertebra, initial encounter for closed fracture: Secondary | ICD-10-CM | POA: Diagnosis not present

## 2018-03-27 LAB — URINALYSIS, ROUTINE W REFLEX MICROSCOPIC
Bilirubin Urine: NEGATIVE
Glucose, UA: NEGATIVE mg/dL
Hgb urine dipstick: NEGATIVE
Ketones, ur: NEGATIVE mg/dL
Nitrite: POSITIVE — AB
Protein, ur: NEGATIVE mg/dL
Specific Gravity, Urine: 1.013 (ref 1.005–1.030)
pH: 7 (ref 5.0–8.0)

## 2018-03-27 LAB — CBC WITH DIFFERENTIAL/PLATELET
Abs Immature Granulocytes: 0.02 10*3/uL (ref 0.00–0.07)
Basophils Absolute: 0.1 10*3/uL (ref 0.0–0.1)
Basophils Relative: 1 %
Eosinophils Absolute: 0.2 10*3/uL (ref 0.0–0.5)
Eosinophils Relative: 2 %
HCT: 37.6 % (ref 36.0–46.0)
Hemoglobin: 11.3 g/dL — ABNORMAL LOW (ref 12.0–15.0)
Immature Granulocytes: 0 %
Lymphocytes Relative: 27 %
Lymphs Abs: 2 10*3/uL (ref 0.7–4.0)
MCH: 28 pg (ref 26.0–34.0)
MCHC: 30.1 g/dL (ref 30.0–36.0)
MCV: 93.3 fL (ref 80.0–100.0)
Monocytes Absolute: 0.5 10*3/uL (ref 0.1–1.0)
Monocytes Relative: 7 %
Neutro Abs: 4.7 10*3/uL (ref 1.7–7.7)
Neutrophils Relative %: 63 %
Platelets: 162 10*3/uL (ref 150–400)
RBC: 4.03 MIL/uL (ref 3.87–5.11)
RDW: 15.3 % (ref 11.5–15.5)
WBC: 7.5 10*3/uL (ref 4.0–10.5)
nRBC: 0 % (ref 0.0–0.2)

## 2018-03-27 LAB — COMPREHENSIVE METABOLIC PANEL
ALT: 18 U/L (ref 0–44)
AST: 24 U/L (ref 15–41)
Albumin: 3.2 g/dL — ABNORMAL LOW (ref 3.5–5.0)
Alkaline Phosphatase: 79 U/L (ref 38–126)
Anion gap: 7 (ref 5–15)
BUN: 14 mg/dL (ref 8–23)
CO2: 33 mmol/L — ABNORMAL HIGH (ref 22–32)
Calcium: 8.8 mg/dL — ABNORMAL LOW (ref 8.9–10.3)
Chloride: 99 mmol/L (ref 98–111)
Creatinine, Ser: 0.98 mg/dL (ref 0.44–1.00)
GFR calc Af Amer: >60 mL/min (ref >60)
GFR calc non Af Amer: 54 mL/min — ABNORMAL LOW (ref >60)
Glucose, Bld: 118 mg/dL — ABNORMAL HIGH (ref 70–99)
Potassium: 4.7 mmol/L (ref 3.5–5.1)
Sodium: 139 mmol/L (ref 135–145)
Total Bilirubin: 1 mg/dL (ref 0.3–1.2)
Total Protein: 6.9 g/dL (ref 6.5–8.1)

## 2018-03-27 LAB — I-STAT CG4 LACTIC ACID, ED
Lactic Acid, Venous: 1.56 mmol/L (ref 0.5–1.9)
Lactic Acid, Venous: 1.08 mmol/L (ref 0.5–1.9)

## 2018-03-27 LAB — CBG MONITORING, ED: Glucose-Capillary: 101 mg/dL — ABNORMAL HIGH (ref 70–99)

## 2018-03-27 LAB — TROPONIN I: Troponin I: <0.03 ng/mL (ref <0.03)

## 2018-03-27 MED ORDER — SODIUM CHLORIDE 0.9 % IV BOLUS
500.0000 mL | Freq: Once | INTRAVENOUS | Status: AC
  Administered 2018-03-27: 500 mL via INTRAVENOUS

## 2018-03-27 NOTE — ED Triage Notes (Signed)
Patient has had tremors, increased weakness and lethargy for the last 3 days. Family reports this is usually how she is when she has a UTI. She currently lives at home with her family.

## 2018-03-27 NOTE — ED Notes (Signed)
Bed: WA23 Expected date:  Expected time:  Means of arrival:  Comments: EMS-UTI 

## 2018-03-27 NOTE — ED Notes (Signed)
Patient transported to CT 

## 2018-03-27 NOTE — ED Notes (Signed)
Pt reminded of needed urine sample.  Pt told she will need to be straight-cathed if unable to void.  Pt given water to drink

## 2018-03-27 NOTE — ED Provider Notes (Signed)
[place] [facility] DEPT Provider Note   CSN: [id number] Arrival date & time: 03/27/18  1222     History   Chief Complaint Chief Complaint  Patient presents with  . Weakness  . Tremors  . Fatigue    HPI SAMREET EDENFIELD is a 84 y.o. female.  Pt presents to the ED today for [facility]  The pt lives at home with her son who called EMS today b/c she's not been acting her normal self.  She has a hx of utis and he was worried that is what she has today.  Pt was admitted from 10/25-28 for [facility].  She was admitted multiple times in October for UTIs and kidney stones.  The pt had her stent exchanged on 10/31.  She had been at [facility] and has recently returned home.  According to SW notes on 11/11, the family was going to try to apply for medicaid for long term placement.  Pt has dementia and is unable to give me much hx.  She denies any complaints.  She does have a hx of afib and is not a candidate for anticoagulation due to frequent falls.      Past Medical History:  Diagnosis Date  . [name] (abdominal aortic aneurysm) ([place]) 09/2013   3.6 cm  . Acute and chronic respiratory failure with hypoxia ([place])   . Acute delirium   . Acute encephalopathy   . Acute pyelonephritis   . Atrial fibrillation ([place])   . Chronic pain syndrome   . Constipation   . Dementia ([place])   . Dependent edema    bilateral legs   . Depression   . Diabetes mellitus    pre-diabetes  . Diverticulosis   . Diverticulosis   . Dysuria   . E coli bacteremia   . Fall   . Gross hematuria   . Hydronephrosis with renal and ureteral calculous obstruction   . Hyperlipidemia   . Hypertension   . Hypoxemia   . Idiopathic peripheral autonomic neuropathy   . Infestation by bed bug   . Nephrolithiasis   . OSA on CPAP   . Osteoarthritis   . Osteopenia    T=  -2.1 in hip  . Persistent mood (affective) disorder, unspecified ([place])   . Prediabetes   . Rheumatoid arthritis ([place])   . Rheumatoid  arthritis(714.0)   . S/P carpal tunnel release   . Sepsis secondary to UTI ([place])   . Spinal stenosis   . Unspecified fracture of fifth lumbar vertebra, initial encounter for closed fracture ([place])   . Unspecified fracture of t11-T12 vertebra, initial encounter for closed fracture ([place])   . Urinary incontinence   . Urinary tract infection    hx of   . Urolithiasis     Patient Active Problem List   Diagnosis Date Noted  . Hypotension 02/23/2018  . Complication associated with peripherally inserted central catheter (PICC) 02/10/2018  . Acute delirium 02/09/2018  . Acute encephalopathy 02/09/2018  . Acute hypoxemic respiratory failure ([place]) 02/05/2018  . Acute on chronic respiratory failure with hypoxia ([place]) 02/03/2018  . Infestation by bed bug 02/03/2018  . Urinary tract infection due to extended-spectrum beta lactamase (ESBL) producing Escherichia coli 02/02/2018  . Chronic a-fib 02/02/2018  . Acute pyelonephritis 01/31/2018  . Constipation 01/31/2018  . Urolithiasis 01/30/2018  . Hydronephrosis with renal and ureteral calculus obstruction 01/30/2018  . Fall   . T12 compression fracture ([place]) 09/04/2017  . E coli bacteremia 07/03/2017  .  Fall at home, initial encounter 07/03/2017  . Tachycardia 02/03/2017  . Sepsis secondary to UTI ([place]) 02/02/2017  . Chronic pain syndrome 02/02/2017  . Abnormal EKG 11/21/2013  . [name] (abdominal aortic aneurysm) ([place]) 09/30/2013  . OSA (obstructive sleep apnea) 06/29/2012  . Dementia ([place]) 06/29/2012  . Pre-diabetes 06/29/2012  . Hypoxia, sleep related 06/29/2012  . RA (rheumatoid arthritis) ([place]) 06/29/2012  . Nonspecific abnormal results of cardiovascular function study 06/02/2011  . Nonspecific abnormal electrocardiogram (ECG) (EKG) 05/18/2011  . OSTEOPOROSIS 04/09/2008  . FLANK PAIN, RIGHT 03/04/2008  . Hyperlipidemia 05/12/2006  . PYELONEPHRITIS, ACUTE 05/12/2006  . DEPENDENT EDEMA, LEGS, BILATERAL 05/12/2006  . URINARY INCONTINENCE  05/12/2006  . PERIPHERAL NEUROPATHY 03/11/2006  . Essential hypertension 03/11/2006  . DYSURIA 03/11/2006    Past Surgical History:  Procedure Laterality Date  . APPENDECTOMY    . CARPAL TUNNEL RELEASE  12/27/2006   Right subcutaneous ulnar nerve transfer -- Right open carpal tunnel release  . CHOLECYSTECTOMY    . CYSTOSCOPY W/ URETERAL STENT PLACEMENT Right 01/30/2018   Procedure: CYSTOSCOPY WITH STENT REPLACEMENT retrograde pylegram;  Surgeon: Ardis Hughs, MD;  [facility];  Service: Urology;  Laterality: Right;  . CYSTOSCOPY WITH URETEROSCOPY AND STENT PLACEMENT Right 03/01/2018   Procedure: CYSTOSCPY/RIGHT URETEROSCOPY HOLMIUM LASER LITHOTRIPSY AND STENT EXCHANGE;  Surgeon: Ardis Hughs, MD;  [facility];  Service: Urology;  Laterality: Right;  . FOOT SURGERY    . HOLMIUM LASER APPLICATION Right 16/01/9603   Procedure: HOLMIUM LASER APPLICATION;  Surgeon: Ardis Hughs, MD;  [facility];  Service: Urology;  Laterality: Right;  . JOINT REPLACEMENT     BTKR, RTHR  . TOTAL KNEE ARTHROPLASTY  08/21/2007   Right total knee replacement     OB History   None      Home Medications    Prior to Admission medications   Medication Sig Start Date End Date Taking? Authorizing Provider  acetaminophen (TYLENOL) 500 MG tablet Take 500 mg by mouth every 8 (eight) hours as needed for mild pain.    Yes [provider]  aspirin EC 81 MG tablet Take 81 mg by mouth daily.   Yes [provider]  atenolol (TENORMIN) 50 MG tablet Take 50 mg by mouth daily.   Yes [provider]  Cholecalciferol (VITAMIN D3) 2000 units TABS Take 2,000 Units by mouth daily.   Yes [provider]  DULoxetine (CYMBALTA) 30 MG capsule Take 90 mg by mouth daily.  07/29/14  Yes [provider]  gabapentin (NEURONTIN) 800 MG tablet Take 800-1,600 mg by mouth as directed. 800 mg in the morning, 800 mg at noon, 1600 mg at night 03/26/18  Yes  [provider]  Melatonin 3 MG TABS Take 3 mg by mouth at bedtime.   Yes [provider]  oxymorphone (OPANA) 5 MG tablet Take 1 tablet (5 mg total) by mouth every 8 (eight) hours as needed for pain. 02/26/18  Yes Tat, Shanon Brow, MD  QUEtiapine (SEROQUEL) 25 MG tablet Take 1 tablet (25 mg total) by mouth at bedtime. 02/12/18  Yes Shelly Coss, MD  rosuvastatin (CRESTOR) 40 MG tablet TAKE 1 TABLET BY MOUTH EVERY DAY Patient taking differently: Take 40 mg by mouth every evening.  12/25/17  Yes [name], MD  gabapentin (NEURONTIN) 100 MG capsule Take 2 capsules (200 mg total) by mouth at bedtime. Patient not taking: Reported on 03/27/2018 02/12/18   Shelly Coss, MD  metoprolol succinate (TOPROL-XL) 50 MG  24 hr tablet Take 1 tablet (50 mg total) by mouth daily. Take with or immediately following a meal. Patient not taking: Reported on 03/27/2018 02/12/18   Shelly Coss, MD    Family History Family History  Problem Relation Age of Onset  . Stroke Father   . Coronary artery disease Son        CABG at age 15  . Heart disease Son   . Heart attack Son   . Stroke Mother   . Hypertension Sister   . Diabetes Brother   . Hypertension Maternal Aunt     Social History Social History   Tobacco Use  . Smoking status: Never Smoker  . Smokeless tobacco: Never Used  Substance Use Topics  . Alcohol use: No  . Drug use: No     Allergies   Sulfonamide derivatives   Review of Systems Review of Systems  All other systems reviewed and are negative.    Physical Exam Updated Vital Signs BP 105/79   Pulse 92   Temp 98.1 F (36.7 C) (Oral)   Resp 14   SpO2 98%   Physical Exam  Constitutional: She is oriented to person, place, and time. She appears well-developed and well-nourished.  HENT:  Head: Normocephalic and atraumatic.  Right Ear: External ear normal.  Left Ear: External ear normal.  Nose: Nose normal.  Mouth/Throat: Oropharynx is clear and  moist.  Eyes: Pupils are equal, round, and reactive to light. Conjunctivae and EOM are normal.  Neck: Normal range of motion. Neck supple.  Cardiovascular: Normal heart sounds and intact distal pulses. An irregularly irregular rhythm present.  Pulmonary/Chest: Effort normal and breath sounds normal.  Abdominal: Soft. Bowel sounds are normal.  Musculoskeletal: Normal range of motion.  Neurological: She is alert and oriented to person, place, and time.  Skin: Skin is warm and dry. Capillary refill takes less than 2 seconds.  Psychiatric: She has a normal mood and affect. Her behavior is normal. Judgment and thought content normal.  Nursing note and vitals reviewed.    ED Treatments / Results  Labs (all labs ordered are listed, but only abnormal results are displayed) Labs Reviewed  CBC WITH DIFFERENTIAL/PLATELET - Abnormal; Notable for the following components:      Result Value   Hemoglobin 11.3 (*)    All other components within normal limits  COMPREHENSIVE METABOLIC PANEL - Abnormal; Notable for the following components:   CO2 33 (*)    Glucose, Bld 118 (*)    Calcium 8.8 (*)    Albumin 3.2 (*)    GFR calc non Af Amer 54 (*)    All other components within normal limits  URINALYSIS, ROUTINE W REFLEX MICROSCOPIC - Abnormal; Notable for the following components:   Nitrite POSITIVE (*)    Leukocytes, UA TRACE (*)    Bacteria, UA RARE (*)    All other components within normal limits  CBG MONITORING, ED - Abnormal; Notable for the following components:   Glucose-Capillary 101 (*)    All other components within normal limits  CULTURE, BLOOD (ROUTINE X 2)  URINE CULTURE  CULTURE, BLOOD (ROUTINE X 2)  TROPONIN I  I-STAT CG4 LACTIC ACID, ED  I-STAT CG4 LACTIC ACID, ED    EKG None  Radiology Dg Chest 2 View  Result Date: 03/27/2018 CLINICAL DATA:  Altered level of consciousness EXAM: CHEST - 2 VIEW COMPARISON:  07/03/2017 FINDINGS: Cardiac enlargement without heart failure.  Atherosclerotic aortic arch in thoracic aorta. Advanced coronary calcification. Lungs  are clear without infiltrate effusion or mass. Severe compression fracture T12 is unchanged. IMPRESSION: No active cardiopulmonary disease. Electronically Signed   By: Franchot Gallo M.D.   On: 03/27/2018 13:02   Ct Head Wo Contrast  Result Date: 03/27/2018 CLINICAL DATA:  Altered mental status/level of consciousness. EXAM: CT HEAD WITHOUT CONTRAST TECHNIQUE: Contiguous axial images were obtained from the base of the skull through the vertex without intravenous contrast. COMPARISON:  February 23, 2018 FINDINGS: Brain: Mild diffuse atrophy is stable. There is no intracranial mass, hemorrhage, extra-axial fluid collection, or midline shift. There is patchy small vessel disease throughout the centra semiovale bilaterally. There is a prior small infarct in the anterior lentiform nucleus on the right. There is small vessel disease in the anterior limb of each external capsule. There is a prior small infarct in medial right upper pons. No acute infarct is demonstrable. Vascular: There is no hyperdense vessel. There is calcification in the distal right vertebral artery as well as in the carotid siphon and right proximal to mid middle cerebral artery regions, stable. There is also mild calcification in the proximal left middle cerebral artery, stable. Skull: The bony calvarium appears intact. There is a small left frontal scalp hematoma. Sinuses/Orbits: Visualized paranasal sinuses are clear. Orbits appear symmetric bilaterally. Other: Mastoid air cells are clear. IMPRESSION: Atrophy with small vessel disease in the periventricular white matter as well as in the right upper pons and basal ganglia regions. No acute infarct evident. No mass or hemorrhage. Multiple foci of arterial vascular calcification noted. Small left frontal scalp hematoma. Electronically Signed   By: Lowella Grip III M.D.   On: 03/27/2018 14:36     Procedures Procedures (including critical care time)  Medications Ordered in ED Medications  sodium chloride 0.9 % bolus 500 mL (0 mLs Intravenous Stopped 03/27/18 1427)     Initial Impression / Assessment and Plan / ED Course  I have reviewed the triage vital signs and the nursing notes.  Pertinent labs & imaging results that were available during my care of the patient were reviewed by me and considered in my medical decision making (see chart for details).    Pt is doing much better after IVFs.  She does not have a UTI.  She is drinking fluids.  Pt d/w her daughter who is told to contact her SW to arrange long term placement.  She does not meet admission criteria now.  Pt is stable for d/c.  Return if worse.  Final Clinical Impressions(s) / ED Diagnoses   Final diagnoses:  Dehydration    ED Discharge Orders    None       Isla Pence, MD 03/27/18 1635

## 2018-03-28 DIAGNOSIS — M1712 Unilateral primary osteoarthritis, left knee: Secondary | ICD-10-CM | POA: Diagnosis not present

## 2018-03-28 DIAGNOSIS — M8008XD Age-related osteoporosis with current pathological fracture, vertebra(e), subsequent encounter for fracture with routine healing: Secondary | ICD-10-CM | POA: Diagnosis not present

## 2018-03-28 DIAGNOSIS — F0391 Unspecified dementia with behavioral disturbance: Secondary | ICD-10-CM | POA: Diagnosis not present

## 2018-03-28 DIAGNOSIS — I482 Chronic atrial fibrillation, unspecified: Secondary | ICD-10-CM | POA: Diagnosis not present

## 2018-03-28 DIAGNOSIS — I10 Essential (primary) hypertension: Secondary | ICD-10-CM | POA: Diagnosis not present

## 2018-03-28 DIAGNOSIS — G9009 Other idiopathic peripheral autonomic neuropathy: Secondary | ICD-10-CM | POA: Diagnosis not present

## 2018-03-29 LAB — URINE CULTURE: Culture: >=100000 — AB

## 2018-03-30 ENCOUNTER — Telehealth: Payer: Self-pay

## 2018-03-30 DIAGNOSIS — M1712 Unilateral primary osteoarthritis, left knee: Secondary | ICD-10-CM | POA: Diagnosis not present

## 2018-03-30 DIAGNOSIS — F0391 Unspecified dementia with behavioral disturbance: Secondary | ICD-10-CM | POA: Diagnosis not present

## 2018-03-30 DIAGNOSIS — I482 Chronic atrial fibrillation, unspecified: Secondary | ICD-10-CM | POA: Diagnosis not present

## 2018-03-30 DIAGNOSIS — I10 Essential (primary) hypertension: Secondary | ICD-10-CM | POA: Diagnosis not present

## 2018-03-30 DIAGNOSIS — G9009 Other idiopathic peripheral autonomic neuropathy: Secondary | ICD-10-CM | POA: Diagnosis not present

## 2018-03-30 DIAGNOSIS — M8008XD Age-related osteoporosis with current pathological fracture, vertebra(e), subsequent encounter for fracture with routine healing: Secondary | ICD-10-CM | POA: Diagnosis not present

## 2018-03-30 NOTE — Telephone Encounter (Signed)
Post ED Visit - Positive Culture Follow-up: Unsuccessful Patient Follow-up  Culture assessed and recommendations reviewed by:  []  Elenor Quinones, Pharm.D. []  Heide Guile, Pharm.D., BCPS AQ-ID []  Parks Neptune, Pharm.D., BCPS []  Alycia Rossetti, Pharm.D., BCPS []  [place], Pharm.D., BCPS, AAHIVP []  Legrand Como, Pharm.D., BCPS, AAHIVP []  Wynell Balloon, PharmD []  Vincenza Hews, PharmD, BCPS Malachy Mood, Lisabeth Devoid D Positive urine culture Symptom Check []  Patient discharged without antimicrobial prescription and treatment is now indicated []  Organism is resistant to prescribed ED discharge antimicrobial []  Patient with positive blood cultures   Unable to contact patient after 3 attempts, letter will be sent to address on file  Genia Del 03/30/2018, 3:15 PM

## 2018-04-01 LAB — CULTURE, BLOOD (ROUTINE X 2)
Culture: NO GROWTH
Culture: NO GROWTH
Special Requests: ADEQUATE

## 2018-04-02 DIAGNOSIS — I10 Essential (primary) hypertension: Secondary | ICD-10-CM | POA: Diagnosis not present

## 2018-04-02 DIAGNOSIS — I482 Chronic atrial fibrillation, unspecified: Secondary | ICD-10-CM | POA: Diagnosis not present

## 2018-04-02 DIAGNOSIS — M1712 Unilateral primary osteoarthritis, left knee: Secondary | ICD-10-CM | POA: Diagnosis not present

## 2018-04-02 DIAGNOSIS — G9009 Other idiopathic peripheral autonomic neuropathy: Secondary | ICD-10-CM | POA: Diagnosis not present

## 2018-04-02 DIAGNOSIS — F0391 Unspecified dementia with behavioral disturbance: Secondary | ICD-10-CM | POA: Diagnosis not present

## 2018-04-02 DIAGNOSIS — M8008XD Age-related osteoporosis with current pathological fracture, vertebra(e), subsequent encounter for fracture with routine healing: Secondary | ICD-10-CM | POA: Diagnosis not present

## 2018-04-03 ENCOUNTER — Encounter: Payer: Self-pay | Admitting: Family Medicine

## 2018-04-03 ENCOUNTER — Ambulatory Visit (INDEPENDENT_AMBULATORY_CARE_PROVIDER_SITE_OTHER): Payer: Medicare Other | Admitting: Family Medicine

## 2018-04-03 VITALS — BP 136/80 | HR 116 | Temp 97.7°F | Resp 20 | Ht 60.0 in | Wt 155.0 lb

## 2018-04-03 DIAGNOSIS — Z09 Encounter for follow-up examination after completed treatment for conditions other than malignant neoplasm: Secondary | ICD-10-CM

## 2018-04-03 DIAGNOSIS — F0391 Unspecified dementia with behavioral disturbance: Secondary | ICD-10-CM | POA: Diagnosis not present

## 2018-04-03 DIAGNOSIS — I10 Essential (primary) hypertension: Secondary | ICD-10-CM | POA: Diagnosis not present

## 2018-04-03 DIAGNOSIS — M8008XD Age-related osteoporosis with current pathological fracture, vertebra(e), subsequent encounter for fracture with routine healing: Secondary | ICD-10-CM | POA: Diagnosis not present

## 2018-04-03 DIAGNOSIS — G9009 Other idiopathic peripheral autonomic neuropathy: Secondary | ICD-10-CM | POA: Diagnosis not present

## 2018-04-03 DIAGNOSIS — R300 Dysuria: Secondary | ICD-10-CM | POA: Diagnosis not present

## 2018-04-03 DIAGNOSIS — M1712 Unilateral primary osteoarthritis, left knee: Secondary | ICD-10-CM | POA: Diagnosis not present

## 2018-04-03 DIAGNOSIS — I482 Chronic atrial fibrillation, unspecified: Secondary | ICD-10-CM | POA: Diagnosis not present

## 2018-04-03 DIAGNOSIS — I6381 Other cerebral infarction due to occlusion or stenosis of small artery: Secondary | ICD-10-CM

## 2018-04-03 LAB — COMPLETE METABOLIC PANEL WITH GFR
AG Ratio: 1.1 (calc) (ref 1.0–2.5)
ALT: 8 U/L (ref 6–29)
AST: 15 U/L (ref 10–35)
Albumin: 3.3 g/dL — ABNORMAL LOW (ref 3.6–5.1)
Alkaline phosphatase (APISO): 122 U/L (ref 33–130)
BUN/Creatinine Ratio: 16 (calc) (ref 6–22)
BUN: 15 mg/dL (ref 7–25)
CO2: 26 mmol/L (ref 20–32)
Calcium: 9.3 mg/dL (ref 8.6–10.4)
Chloride: 97 mmol/L — ABNORMAL LOW (ref 98–110)
Creat: 0.94 mg/dL — ABNORMAL HIGH (ref 0.60–0.88)
GFR, Est African American: 65 mL/min/{1.73_m2} (ref >=60)
GFR, Est Non African American: 56 mL/min/{1.73_m2} — ABNORMAL LOW (ref >=60)
Globulin: 3.1 g/dL (calc) (ref 1.9–3.7)
Glucose, Bld: 142 mg/dL — ABNORMAL HIGH (ref 65–99)
Potassium: 4.9 mmol/L (ref 3.5–5.3)
Sodium: 135 mmol/L (ref 135–146)
Total Bilirubin: 0.8 mg/dL (ref 0.2–1.2)
Total Protein: 6.4 g/dL (ref 6.1–8.1)

## 2018-04-03 LAB — CBC WITH DIFFERENTIAL/PLATELET
Basophils Absolute: 74 cells/uL (ref 0–200)
Basophils Relative: 0.7 %
Eosinophils Absolute: 106 cells/uL (ref 15–500)
Eosinophils Relative: 1 %
HCT: 39.4 % (ref 35.0–45.0)
Hemoglobin: 12.8 g/dL (ref 11.7–15.5)
Lymphs Abs: 2661 cells/uL (ref 850–3900)
MCH: 28.1 pg (ref 27.0–33.0)
MCHC: 32.5 g/dL (ref 32.0–36.0)
MCV: 86.6 fL (ref 80.0–100.0)
MPV: 9.7 fL (ref 7.5–12.5)
Monocytes Relative: 7.4 %
Neutro Abs: 6975 cells/uL (ref 1500–7800)
Neutrophils Relative %: 65.8 %
Platelets: 411 10*3/uL — ABNORMAL HIGH (ref 140–400)
RBC: 4.55 10*6/uL (ref 3.80–5.10)
RDW: 14.4 % (ref 11.0–15.0)
Total Lymphocyte: 25.1 %
WBC mixed population: 784 cells/uL (ref 200–950)
WBC: 10.6 10*3/uL (ref 3.8–10.8)

## 2018-04-03 NOTE — Progress Notes (Signed)
Subjective:    Patient ID: [name], female    DOB: October 04, 1933, 84 y.o.   MRN: [id number]  HPI Patient was admitted to the hospital.  She is here for discharge follow up.  I have copied relevant portions of the DC summary below for reference:  Admit date: 02/23/2018 Discharge date: 02/26/2018  Admitted From: [name] Disposition:  [name]  Recommendations for Outpatient Follow-up:  1. Follow up with PCP in 1-2 weeks 2. Please obtain BMP/CBC in one week 3. Please transport patient to [facility] by [facility] for surgery 4. Keep foley catheter in place until surgery  Brief/Interim Summary: 84 year old female with a history of dementia, chronic atrial fibrillation, impaired glucose tolerance, spinal stenosis, hypertension, hyperlipidemia presented after an unwitnessed fall from her nursing facility, [facility]. This represents the patient's fourth hospital admission since January 30, 2018. Apparently, there was some concern the patient may have been confused. As result, the patient was brought to emergency department for further evaluation. Unfortunately, the patient is unable to provide a significant history secondary to her cognitive impairment. On her part, the patient denies any chest pain, shortness breath, nausea no vomiting, diarrhea, abdominal pain, dysuria. However she complains of some pain in her forehead where she fell and hit her head on the evening of 02/23/2018. The patient was most recently admitted to the hospital from 02/09/2018 through 02/12/2018 for acute metabolic encephalopathy. It was thought to been due to her continued infectious process. In addition, the patient was initially admitted from 01/30/2018 through 02/03/2018. During that admission, the patient was noted to have an infected right ureteral stone. She underwent cystoureteroscopy with placement of double-J stent in the right ureter by Dr. Louis Meckel. Cultures and urine cultures  grew ESBL E. coli. The patient was discharged back to nursing facility with a PICC line to finish ertapenem with anticipated stop date 02/14/2018. However, on her most recent admission, the patient's PICC line was noted to be displaced. It was removed on 02/10/2018. The patient's family deferred placement of another PICC line. However, from the documentation, it is unclear what type of IV access,if any, was placed to finish off the patient's course of ertapenem at the time of discharge on 02/12/2018.  Because of the the patient's fall and forehead laceration and concerns for confusion, the patient was admitted for further evaluation. The patient has remained afebrile hemodynamically stable saturating 90% on room air. Patient was noted to be mildly hypertensive in the emergency department and fluid resuscitated with improvement of her blood pressure. Lactic acid was 0.79. BMP and LFTs were unremarkable. WBC was 10.5 at the time of admission.  Discharge Diagnoses:   Pyelonephritis--GNR/ Infected renal stone -UA with greater than 50 WBC -culture = pansensitive Ecoli and enterococcus -Continue meropenem pending urine culture data -d/c with amoxil x 7 more days -high risk of sepsis and recurrent infection if d/c with oral abx -in the setting of recent JJ-stent placement from ureteral stone-->CT abd -10/26 CT abd--4 mm upper pole calculus seen on the prior CT scan is now in the right upper ureter adjacent to the double-J ureteral stent at the L2-3 level. Mild persistent right-sided hydronephrosis. -case discussed with urology, Dr. Oneida Arenas foley -10/27 renal US--no hydronephrosis -pt has surgery scheduled for 03/01/18 to remove stent at [place] metabolic encephalopathy -Likely multifactorial including the UTI, opioids, dehydration and possible sundowning in the setting of underlying dementia. -02/24/2018, the patient's mental statusimproved to baseline per  daughter -02/10/2018 serum  B12-297, ammonia 15, TSH 3.641, RPR nonreactive, RBC folate 1338  Hypotension -lactic acid 0.79 -improved with IVF  Nephrolithiasis with ureteral obstruction status post stent -01/31/18--right ureteral stent placed, Dr. Louis Meckel -will need outpatient follow-up for stent removal -02/11/2018 CT abdomen and pelvis--persistent mild right pelvic caliectasis with hyperenhancement of the right ureteral collecting system and ureteral urothelium;interval placement of right double-J stent  Mechanical fall with forehead laceration -The patient is not a candidate for anticoagulation secondary to her numerous falls -Steri-Strips were placed in the emergency department -Local wound care  Chronic atrial fibrillation -Rate controlled -Gradually increase metoprolol succinate dose back to 50 mg daily -CHADSVASc = 4 (HTN, Age, female) -not a candidate for [facility] due to multiple falls  Chronic pain syndrome -Continue home dose of gabapentin and formulary equivalent for Opana  Hyperlipidemia -Continue Crestor  [name] -02/11/18 CT abd = 3.9 cm -outpt surveillance  Left knee pain -10/26 xray--neg for fracture or dislocation -secondary to DJD and recent fall -uric acid 206   Here for follow up.  Had right ureteral stent placed by Dr. Louis Meckel on 10/31.  Patient has been taken off anticoagulation and is currently on aspirin.  I completely agree with this.  Patient is a high fall risk and has fallen numerous times.  She is suffered contusions to her face secondary to the falls.  She is fractured her vertebrae secondary to falls.  Therefore I do not believe she is a candidate for anticoagulation.  I reiterated this to the patient and her son today.  She had her stent removed in early November.  She is been off all antibiotics since early November.  She denies any dysuria.  She denies any fevers or chills.  She did go to the hospital on November 26 with chills however  work-up at that time including a normal BMP and a normal CBC.  No one has rechecked her urine since her hospitalization.  She does complain of some atypical right-sided chest pain.  It is located to the right of her sternum in her right breast.  It is made worse by twisting and movement.  She denies any shortness of breath with activity.  She denies any chest pain or left-sided chest pain or substernal chest pain with activity.  She denies any difficulty breathing.  She denies any nausea or vomiting.  The pain is reproducible with palpation to the right of her sternum.  Past Medical History:  Diagnosis Date  . [name] (abdominal aortic aneurysm) ([place]) 09/2013   3.6 cm  . Acute and chronic respiratory failure with hypoxia ([place])   . Acute delirium   . Acute encephalopathy   . Acute pyelonephritis   . Atrial fibrillation ([place])   . Chronic pain syndrome   . Constipation   . Dementia ([place])   . Dependent edema    bilateral legs   . Depression   . Diabetes mellitus    pre-diabetes  . Diverticulosis   . Diverticulosis   . Dysuria   . E coli bacteremia   . Fall   . Gross hematuria   . Hydronephrosis with renal and ureteral calculous obstruction   . Hyperlipidemia   . Hypertension   . Hypoxemia   . Idiopathic peripheral autonomic neuropathy   . Infestation by bed bug   . Nephrolithiasis   . OSA on CPAP   . Osteoarthritis   . Osteopenia    T=  -2.1 in hip  . Persistent mood (affective) disorder, unspecified ([place])   .  Prediabetes   . Rheumatoid arthritis ([place])   . Rheumatoid arthritis(714.0)   . S/P carpal tunnel release   . Sepsis secondary to UTI ([place])   . Spinal stenosis   . Unspecified fracture of fifth lumbar vertebra, initial encounter for closed fracture ([place])   . Unspecified fracture of t11-T12 vertebra, initial encounter for closed fracture ([place])   . Urinary incontinence   . Urinary tract infection    hx of   . Urolithiasis    Past Surgical History:  Procedure Laterality  Date  . APPENDECTOMY    . CARPAL TUNNEL RELEASE  12/27/2006   Right subcutaneous ulnar nerve transfer -- Right open carpal tunnel release  . CHOLECYSTECTOMY    . CYSTOSCOPY W/ URETERAL STENT PLACEMENT Right 01/30/2018   Procedure: CYSTOSCOPY WITH STENT REPLACEMENT retrograde pylegram;  Surgeon: Ardis Hughs, MD;  Location: [facility];  Service: Urology;  Laterality: Right;  . CYSTOSCOPY WITH URETEROSCOPY AND STENT PLACEMENT Right 03/01/2018   Procedure: CYSTOSCPY/RIGHT URETEROSCOPY HOLMIUM LASER LITHOTRIPSY AND STENT EXCHANGE;  Surgeon: Ardis Hughs, MD;  Location: [facility];  Service: Urology;  Laterality: Right;  . FOOT SURGERY    . HOLMIUM LASER APPLICATION Right 35/57/3220   Procedure: HOLMIUM LASER APPLICATION;  Surgeon: Ardis Hughs, MD;  Location: [facility];  Service: Urology;  Laterality: Right;  . JOINT REPLACEMENT     BTKR, RTHR  . TOTAL KNEE ARTHROPLASTY  08/21/2007   Right total knee replacement   Current Outpatient Medications on File Prior to Visit  Medication Sig Dispense Refill  . acetaminophen (TYLENOL) 500 MG tablet Take 500 mg by mouth every 8 (eight) hours as needed for mild pain.     Marland Kitchen aspirin EC 81 MG tablet Take 81 mg by mouth daily.    Marland Kitchen atenolol (TENORMIN) 50 MG tablet Take 50 mg by mouth daily.    . Cholecalciferol (VITAMIN D3) 2000 units TABS Take 2,000 Units by mouth daily.    . DULoxetine (CYMBALTA) 30 MG capsule Take 90 mg by mouth daily.   2  . gabapentin (NEURONTIN) 100 MG capsule Take 2 capsules (200 mg total) by mouth at bedtime. (Patient not taking: Reported on 03/27/2018) 120 capsule 2  . gabapentin (NEURONTIN) 800 MG tablet Take 800-1,600 mg by mouth as directed. 800 mg in the morning, 800 mg at noon, 1600 mg at night    . Melatonin 3 MG TABS Take 3 mg by mouth at bedtime.    . metoprolol succinate (TOPROL-XL) 50 MG 24 hr tablet Take 1 tablet (50 mg total) by mouth daily. Take with or immediately following a meal. (Patient not taking: Reported  on 03/27/2018) 30 tablet 0  . oxymorphone (OPANA) 5 MG tablet Take 1 tablet (5 mg total) by mouth every 8 (eight) hours as needed for pain. 5 tablet 0  . QUEtiapine (SEROQUEL) 25 MG tablet Take 1 tablet (25 mg total) by mouth at bedtime. 30 tablet 0  . rosuvastatin (CRESTOR) 40 MG tablet TAKE 1 TABLET BY MOUTH EVERY DAY (Patient taking differently: Take 40 mg by mouth every evening. ) 90 tablet 3   No current facility-administered medications on file prior to visit.    Allergies  Allergen Reactions  . Sulfonamide Derivatives Hives and Rash   Social History   Socioeconomic History  . Marital status: Divorced    Spouse name: Not on file  . Number of children: 6  . Years of education: Not on file  . Highest education level: Not on file  Occupational History  . Not on file  Social Needs  . Financial resource strain: Not on file  . Food insecurity:    Worry: Not on file    Inability: Not on file  . Transportation needs:    Medical: Not on file    Non-medical: Not on file  Tobacco Use  . Smoking status: Never Smoker  . Smokeless tobacco: Never Used  Substance and Sexual Activity  . Alcohol use: No  . Drug use: No  . Sexual activity: Not on file  Lifestyle  . Physical activity:    Days per week: Not on file    Minutes per session: Not on file  . Stress: Not on file  Relationships  . Social connections:    Talks on phone: Not on file    Gets together: Not on file    Attends religious service: Not on file    Active member of club or organization: Not on file    Attends meetings of clubs or organizations: Not on file    Relationship status: Not on file  . Intimate partner violence:    Fear of current or ex partner: Not on file    Emotionally abused: Not on file    Physically abused: Not on file    Forced sexual activity: Not on file  Other Topics Concern  . Not on file  Social History Narrative  . Not on file    Review of Systems  All other systems reviewed and are  negative.      Objective:   Physical Exam  Constitutional: She appears well-developed and well-nourished. No distress.  Cardiovascular: Normal rate. Exam reveals no gallop and no friction rub.  No murmur heard. Pulmonary/Chest: Effort normal and breath sounds normal. No stridor. No respiratory distress. She has no wheezes. She has no rales. She exhibits tenderness.    Abdominal: Soft. Bowel sounds are normal. She exhibits no distension and no mass. There is no tenderness. There is no rebound and no guarding. No hernia.  Musculoskeletal: She exhibits tenderness.  Skin: She is not diaphoretic.  Vitals reviewed.         Assessment & Plan:  Hospital discharge follow-up - Plan: CBC with Differential/Platelet, COMPLETE METABOLIC PANEL WITH GFR, Urine Culture, Urinalysis, Routine w reflex microscopic  I will repeat a CBC as well as a CMP today to monitor her kidney function test and also to evaluate for any evidence of leukocytosis.  I will repeat a urinalysis and urine culture to ensure resolution of her urinary tract infection given her difficulties during the month of November.  She is off all antibiotics and is asymptomatic.  I believe the right-sided chest pain is chest wall pain.  I explained to the patient and her son if she develops substernal pain or shortness of breath or pressure-like pain related with activity that she should go to the hospital however I believe this is likely muscular.  Patient's son has requested that we consult home health to see if anyone can come out possibly once or twice a week to help the patient have a bath and help with personal hygiene.  I will place this consultation.

## 2018-04-04 LAB — URINALYSIS, ROUTINE W REFLEX MICROSCOPIC
Bilirubin Urine: NEGATIVE
Glucose, UA: NEGATIVE
Hgb urine dipstick: NEGATIVE
Ketones, ur: NEGATIVE
Nitrite: POSITIVE — AB
RBC / HPF: NONE SEEN /HPF (ref 0–2)
Specific Gravity, Urine: 1.018 (ref 1.001–1.03)
pH: >=8.5 — AB (ref 5.0–8.0)

## 2018-04-05 ENCOUNTER — Other Ambulatory Visit: Payer: Self-pay | Admitting: Family Medicine

## 2018-04-05 LAB — URINE CULTURE
MICRO NUMBER:: 91445743
SPECIMEN QUALITY:: ADEQUATE

## 2018-04-05 MED ORDER — NITROFURANTOIN MONOHYD MACRO 100 MG PO CAPS: 100.0000 mg | ORAL_CAPSULE | Freq: Two times a day (BID) | ORAL | 0 refills | Status: DC

## 2018-04-06 ENCOUNTER — Inpatient Hospital Stay (HOSPITAL_COMMUNITY)
Admission: EM | Admit: 2018-04-06 | Discharge: 2018-04-10 | DRG: 689 | Disposition: A | Discharge status: Home Health | Payer: Medicare Other | Attending: Internal Medicine | Admitting: Internal Medicine

## 2018-04-06 ENCOUNTER — Emergency Department (HOSPITAL_COMMUNITY): Payer: Medicare Other

## 2018-04-06 ENCOUNTER — Telehealth: Payer: Self-pay | Admitting: *Deleted

## 2018-04-06 ENCOUNTER — Encounter (HOSPITAL_COMMUNITY): Payer: Self-pay

## 2018-04-06 ENCOUNTER — Other Ambulatory Visit: Payer: Self-pay

## 2018-04-06 ENCOUNTER — Encounter: Payer: Self-pay | Admitting: Family Medicine

## 2018-04-06 DIAGNOSIS — Y95 Nosocomial condition: Secondary | ICD-10-CM | POA: Diagnosis present

## 2018-04-06 DIAGNOSIS — I482 Chronic atrial fibrillation, unspecified: Secondary | ICD-10-CM | POA: Diagnosis not present

## 2018-04-06 DIAGNOSIS — R7303 Prediabetes: Secondary | ICD-10-CM | POA: Diagnosis present

## 2018-04-06 DIAGNOSIS — N390 Urinary tract infection, site not specified: Principal | ICD-10-CM | POA: Diagnosis present

## 2018-04-06 DIAGNOSIS — G9340 Encephalopathy, unspecified: Secondary | ICD-10-CM | POA: Diagnosis present

## 2018-04-06 DIAGNOSIS — E785 Hyperlipidemia, unspecified: Secondary | ICD-10-CM | POA: Diagnosis present

## 2018-04-06 DIAGNOSIS — I10 Essential (primary) hypertension: Secondary | ICD-10-CM | POA: Diagnosis not present

## 2018-04-06 DIAGNOSIS — Z1612 Extended spectrum beta lactamase (ESBL) resistance: Secondary | ICD-10-CM

## 2018-04-06 DIAGNOSIS — Z7982 Long term (current) use of aspirin: Secondary | ICD-10-CM

## 2018-04-06 DIAGNOSIS — Z9989 Dependence on other enabling machines and devices: Secondary | ICD-10-CM

## 2018-04-06 DIAGNOSIS — Z96643 Presence of artificial hip joint, bilateral: Secondary | ICD-10-CM | POA: Diagnosis present

## 2018-04-06 DIAGNOSIS — F0390 Unspecified dementia without behavioral disturbance: Secondary | ICD-10-CM | POA: Diagnosis present

## 2018-04-06 DIAGNOSIS — Z79899 Other long term (current) drug therapy: Secondary | ICD-10-CM

## 2018-04-06 DIAGNOSIS — G894 Chronic pain syndrome: Secondary | ICD-10-CM | POA: Diagnosis present

## 2018-04-06 DIAGNOSIS — Z66 Do not resuscitate: Secondary | ICD-10-CM | POA: Diagnosis not present

## 2018-04-06 DIAGNOSIS — E782 Mixed hyperlipidemia: Secondary | ICD-10-CM | POA: Diagnosis not present

## 2018-04-06 DIAGNOSIS — J90 Pleural effusion, not elsewhere classified: Secondary | ICD-10-CM | POA: Diagnosis not present

## 2018-04-06 DIAGNOSIS — Z882 Allergy status to sulfonamides status: Secondary | ICD-10-CM | POA: Diagnosis not present

## 2018-04-06 DIAGNOSIS — B9629 Other Escherichia coli [E. coli] as the cause of diseases classified elsewhere: Secondary | ICD-10-CM | POA: Diagnosis not present

## 2018-04-06 DIAGNOSIS — F329 Major depressive disorder, single episode, unspecified: Secondary | ICD-10-CM | POA: Diagnosis not present

## 2018-04-06 DIAGNOSIS — I714 Abdominal aortic aneurysm, without rupture: Secondary | ICD-10-CM | POA: Diagnosis present

## 2018-04-06 DIAGNOSIS — Z96653 Presence of artificial knee joint, bilateral: Secondary | ICD-10-CM | POA: Diagnosis present

## 2018-04-06 DIAGNOSIS — R918 Other nonspecific abnormal finding of lung field: Secondary | ICD-10-CM | POA: Diagnosis not present

## 2018-04-06 DIAGNOSIS — F0391 Unspecified dementia with behavioral disturbance: Secondary | ICD-10-CM | POA: Diagnosis not present

## 2018-04-06 DIAGNOSIS — J440 Chronic obstructive pulmonary disease with acute lower respiratory infection: Secondary | ICD-10-CM | POA: Diagnosis not present

## 2018-04-06 DIAGNOSIS — J189 Pneumonia, unspecified organism: Secondary | ICD-10-CM | POA: Diagnosis present

## 2018-04-06 DIAGNOSIS — G92 Toxic encephalopathy: Secondary | ICD-10-CM | POA: Diagnosis present

## 2018-04-06 DIAGNOSIS — R079 Chest pain, unspecified: Secondary | ICD-10-CM

## 2018-04-06 DIAGNOSIS — M069 Rheumatoid arthritis, unspecified: Secondary | ICD-10-CM | POA: Diagnosis present

## 2018-04-06 DIAGNOSIS — M8008XD Age-related osteoporosis with current pathological fracture, vertebra(e), subsequent encounter for fracture with routine healing: Secondary | ICD-10-CM | POA: Diagnosis not present

## 2018-04-06 DIAGNOSIS — J9811 Atelectasis: Secondary | ICD-10-CM | POA: Diagnosis not present

## 2018-04-06 DIAGNOSIS — J181 Lobar pneumonia, unspecified organism: Secondary | ICD-10-CM | POA: Diagnosis not present

## 2018-04-06 DIAGNOSIS — I4891 Unspecified atrial fibrillation: Secondary | ICD-10-CM | POA: Diagnosis not present

## 2018-04-06 DIAGNOSIS — R0902 Hypoxemia: Secondary | ICD-10-CM | POA: Diagnosis not present

## 2018-04-06 DIAGNOSIS — A499 Bacterial infection, unspecified: Secondary | ICD-10-CM | POA: Diagnosis present

## 2018-04-06 DIAGNOSIS — G9009 Other idiopathic peripheral autonomic neuropathy: Secondary | ICD-10-CM | POA: Diagnosis not present

## 2018-04-06 DIAGNOSIS — B9620 Unspecified Escherichia coli [E. coli] as the cause of diseases classified elsewhere: Secondary | ICD-10-CM | POA: Diagnosis present

## 2018-04-06 DIAGNOSIS — G4733 Obstructive sleep apnea (adult) (pediatric): Secondary | ICD-10-CM | POA: Diagnosis present

## 2018-04-06 DIAGNOSIS — R0789 Other chest pain: Secondary | ICD-10-CM | POA: Diagnosis not present

## 2018-04-06 DIAGNOSIS — M1712 Unilateral primary osteoarthritis, left knee: Secondary | ICD-10-CM | POA: Diagnosis not present

## 2018-04-06 LAB — BASIC METABOLIC PANEL
Anion gap: 8 (ref 5–15)
BUN: 18 mg/dL (ref 8–23)
CO2: 27 mmol/L (ref 22–32)
Calcium: 8.5 mg/dL — ABNORMAL LOW (ref 8.9–10.3)
Chloride: 101 mmol/L (ref 98–111)
Creatinine, Ser: 0.84 mg/dL (ref 0.44–1.00)
GFR calc Af Amer: >60 mL/min (ref >60)
GFR calc non Af Amer: >60 mL/min (ref >60)
Glucose, Bld: 109 mg/dL — ABNORMAL HIGH (ref 70–99)
Potassium: 4.6 mmol/L (ref 3.5–5.1)
Sodium: 136 mmol/L (ref 135–145)

## 2018-04-06 LAB — LACTIC ACID, PLASMA: Lactic Acid, Venous: 1 mmol/L (ref 0.5–1.9)

## 2018-04-06 LAB — URINALYSIS, ROUTINE W REFLEX MICROSCOPIC
Bilirubin Urine: NEGATIVE
Glucose, UA: NEGATIVE mg/dL
Ketones, ur: NEGATIVE mg/dL
Nitrite: NEGATIVE
Protein, ur: NEGATIVE mg/dL
Specific Gravity, Urine: 1.039 — ABNORMAL HIGH (ref 1.005–1.030)
pH: 5 (ref 5.0–8.0)

## 2018-04-06 LAB — CBC
HCT: 39 % (ref 36.0–46.0)
Hemoglobin: 11.6 g/dL — ABNORMAL LOW (ref 12.0–15.0)
MCH: 27.4 pg (ref 26.0–34.0)
MCHC: 29.7 g/dL — ABNORMAL LOW (ref 30.0–36.0)
MCV: 92.2 fL (ref 80.0–100.0)
Platelets: 362 10*3/uL (ref 150–400)
RBC: 4.23 MIL/uL (ref 3.87–5.11)
RDW: 15.2 % (ref 11.5–15.5)
WBC: 10.5 10*3/uL (ref 4.0–10.5)
nRBC: 0 % (ref 0.0–0.2)

## 2018-04-06 LAB — D-DIMER, QUANTITATIVE: D-Dimer, Quant: 5.89 ug/mL-FEU — ABNORMAL HIGH (ref 0.00–0.50)

## 2018-04-06 LAB — TROPONIN I
Troponin I: <0.03 ng/mL (ref <0.03)
Troponin I: <0.03 ng/mL (ref <0.03)

## 2018-04-06 LAB — MRSA PCR SCREENING: MRSA by PCR: NEGATIVE

## 2018-04-06 LAB — GLUCOSE, CAPILLARY: Glucose-Capillary: 89 mg/dL (ref 70–99)

## 2018-04-06 LAB — BRAIN NATRIURETIC PEPTIDE: B Natriuretic Peptide: 177 pg/mL — ABNORMAL HIGH (ref 0.0–100.0)

## 2018-04-06 MED ORDER — ONDANSETRON HCL 4 MG/2ML IJ SOLN: 4.0000 mg | Freq: Four times a day (QID) | INTRAMUSCULAR | Status: DC | PRN

## 2018-04-06 MED ORDER — GABAPENTIN 800 MG PO TABS: 800.0000 mg | ORAL_TABLET | ORAL | Status: DC

## 2018-04-06 MED ORDER — ACETAMINOPHEN 325 MG PO TABS
650.0000 mg | ORAL_TABLET | Freq: Four times a day (QID) | ORAL | Status: DC | PRN
  Administered 2018-04-08 – 2018-04-09 (×2): 650 mg via ORAL
  Filled 2018-04-06 (×2): qty 2

## 2018-04-06 MED ORDER — VANCOMYCIN HCL 10 G IV SOLR
1500.0000 mg | Freq: Once | INTRAVENOUS | Status: DC
  Filled 2018-04-06: qty 1500

## 2018-04-06 MED ORDER — ENOXAPARIN SODIUM 40 MG/0.4ML ~~LOC~~ SOLN
40.0000 mg | SUBCUTANEOUS | Status: DC
  Administered 2018-04-06 – 2018-04-09 (×4): 40 mg via SUBCUTANEOUS
  Filled 2018-04-06 (×4): qty 0.4

## 2018-04-06 MED ORDER — ASPIRIN EC 81 MG PO TBEC
81.0000 mg | DELAYED_RELEASE_TABLET | Freq: Every day | ORAL | Status: DC
  Administered 2018-04-07 – 2018-04-10 (×4): 81 mg via ORAL
  Filled 2018-04-06 (×4): qty 1

## 2018-04-06 MED ORDER — GABAPENTIN 400 MG PO CAPS
1600.0000 mg | ORAL_CAPSULE | Freq: Every day | ORAL | Status: DC
  Administered 2018-04-06 – 2018-04-09 (×4): 1600 mg via ORAL
  Filled 2018-04-06 (×4): qty 4

## 2018-04-06 MED ORDER — ATENOLOL 25 MG PO TABS
50.0000 mg | ORAL_TABLET | Freq: Every day | ORAL | Status: DC
  Administered 2018-04-06 – 2018-04-10 (×5): 50 mg via ORAL
  Filled 2018-04-06 (×5): qty 2

## 2018-04-06 MED ORDER — SODIUM CHLORIDE 0.9 % IV SOLN
INTRAVENOUS | Status: DC
  Administered 2018-04-06 – 2018-04-08 (×4): via INTRAVENOUS

## 2018-04-06 MED ORDER — DEXAMETHASONE 4 MG PO TABS: 4.0000 mg | ORAL_TABLET | Freq: Two times a day (BID) | ORAL | 0 refills | Status: DC

## 2018-04-06 MED ORDER — DULOXETINE HCL 60 MG PO CPEP
90.0000 mg | ORAL_CAPSULE | Freq: Every day | ORAL | Status: DC
  Administered 2018-04-07 – 2018-04-10 (×4): 90 mg via ORAL
  Filled 2018-04-06 (×4): qty 1

## 2018-04-06 MED ORDER — SODIUM CHLORIDE 0.9 % IV SOLN
1.0000 g | Freq: Once | INTRAVENOUS | Status: AC
  Administered 2018-04-06: 1 g via INTRAVENOUS
  Filled 2018-04-06: qty 1

## 2018-04-06 MED ORDER — ROSUVASTATIN CALCIUM 20 MG PO TABS
40.0000 mg | ORAL_TABLET | Freq: Every evening | ORAL | Status: DC
  Administered 2018-04-06 – 2018-04-09 (×4): 40 mg via ORAL
  Filled 2018-04-06 (×4): qty 2

## 2018-04-06 MED ORDER — TRAMADOL HCL 50 MG PO TABS: 50.0000 mg | ORAL_TABLET | Freq: Four times a day (QID) | ORAL | 0 refills | Status: DC | PRN

## 2018-04-06 MED ORDER — ONDANSETRON HCL 4 MG PO TABS: 4.0000 mg | ORAL_TABLET | Freq: Four times a day (QID) | ORAL | Status: DC | PRN

## 2018-04-06 MED ORDER — ACETAMINOPHEN 650 MG RE SUPP: 650.0000 mg | Freq: Four times a day (QID) | RECTAL | Status: DC | PRN

## 2018-04-06 MED ORDER — IOPAMIDOL (ISOVUE-370) INJECTION 76%
75.0000 mL | Freq: Once | INTRAVENOUS | Status: AC | PRN
  Administered 2018-04-06: 75 mL via INTRAVENOUS

## 2018-04-06 MED ORDER — MORPHINE SULFATE 15 MG PO TABS
15.0000 mg | ORAL_TABLET | Freq: Three times a day (TID) | ORAL | Status: DC | PRN
  Filled 2018-04-06: qty 1

## 2018-04-06 MED ORDER — GABAPENTIN 400 MG PO CAPS
800.0000 mg | ORAL_CAPSULE | Freq: Two times a day (BID) | ORAL | Status: DC
  Administered 2018-04-07 – 2018-04-10 (×7): 800 mg via ORAL
  Filled 2018-04-06 (×7): qty 2

## 2018-04-06 MED ORDER — ALBUTEROL SULFATE (2.5 MG/3ML) 0.083% IN NEBU
2.5000 mg | INHALATION_SOLUTION | Freq: Once | RESPIRATORY_TRACT | Status: AC
  Administered 2018-04-06: 2.5 mg via RESPIRATORY_TRACT
  Filled 2018-04-06: qty 3

## 2018-04-06 MED ORDER — SENNOSIDES-DOCUSATE SODIUM 8.6-50 MG PO TABS: 1.0000 | ORAL_TABLET | Freq: Every evening | ORAL | Status: DC | PRN

## 2018-04-06 MED ORDER — VANCOMYCIN HCL IN DEXTROSE 750-5 MG/150ML-% IV SOLN
750.0000 mg | Freq: Two times a day (BID) | INTRAVENOUS | Status: DC
  Filled 2018-04-06 (×5): qty 150

## 2018-04-06 MED ORDER — SODIUM CHLORIDE 0.9 % IV SOLN
1.0000 g | Freq: Two times a day (BID) | INTRAVENOUS | Status: DC
  Administered 2018-04-06 – 2018-04-10 (×8): 1 g via INTRAVENOUS
  Filled 2018-04-06 (×10): qty 1

## 2018-04-06 NOTE — Telephone Encounter (Signed)
Received call from [name] [facility] with [facility].   Reports that during visit today patient voiced C/O substernal chest pain that is constant and not relieved. States that patient was seen in office for R sided chest pain, but was thought to be chest wall pain.   Reports that now that nature of pain has changed, she would like to send to ER for eval.   Agreed with [facility] plan. Patient verbalized understanding.

## 2018-04-06 NOTE — Telephone Encounter (Signed)
Agree, go to ER immediately.

## 2018-04-06 NOTE — ED Provider Notes (Signed)
[facility] EMERGENCY DEPARTMENT Provider Note   CSN: [id number] Arrival date & time: 04/06/18  6270     History   Chief Complaint Chief Complaint  Patient presents with  . Chest Pain    HPI [name] is a 84 y.o. female.  Patient is an 84 year old female who presents to the emergency department with a complaint of chest pain.  Patient has a history of dementia, chronic pain syndrome, abdominal aortic aneurysm, atrial fibrillation, not on anticoagulation, and recently diagnosed urinary tract infection.  The patient's son states that the patient has been having more and more discomfort in her chest.  She had some right-sided chest pain earlier during the week, she was seen in her doctor's office.  It was found that she had urinary tract infection.  They were waiting on a culture to begin her treatment.  The family was told that if the pain continued to get worse to come to the emergency department for additional evaluation.  The patient states that she has very little right-sided chest pain now, but now has left-sided chest pain.  There is been no recent falls or injury to the chest. Family reports pt not as alert and more confused than usual.  No high fever reported.  Patient to be evaluated for these issues. She left [facility] facility Nov 18.  The history is provided by the patient and a relative.    Past Medical History:  Diagnosis Date  . [name] (abdominal aortic aneurysm) ([place]) 09/2013   3.6 cm  . Acute and chronic respiratory failure with hypoxia ([place])   . Acute delirium   . Acute encephalopathy   . Acute pyelonephritis   . Atrial fibrillation ([place])   . Chronic pain syndrome   . Constipation   . Dementia ([place])   . Dependent edema    bilateral legs   . Depression   . Diabetes mellitus    pre-diabetes  . Diverticulosis   . Diverticulosis   . Dysuria   . E coli bacteremia   . Fall   . Gross hematuria   . Hydronephrosis with renal and ureteral  calculous obstruction   . Hyperlipidemia   . Hypertension   . Hypoxemia   . Idiopathic peripheral autonomic neuropathy   . Infestation by bed bug   . Nephrolithiasis   . OSA on CPAP   . Osteoarthritis   . Osteopenia    T=  -2.1 in hip  . Persistent mood (affective) disorder, unspecified ([place])   . Prediabetes   . Rheumatoid arthritis ([place])   . Rheumatoid arthritis(714.0)   . S/P carpal tunnel release   . Sepsis secondary to UTI ([place])   . Spinal stenosis   . Unspecified fracture of fifth lumbar vertebra, initial encounter for closed fracture ([place])   . Unspecified fracture of t11-T12 vertebra, initial encounter for closed fracture ([place])   . Urinary incontinence   . Urinary tract infection    hx of   . Urolithiasis     Patient Active Problem List   Diagnosis Date Noted  . Hypotension 02/23/2018  . Complication associated with peripherally inserted central catheter (PICC) 02/10/2018  . Acute delirium 02/09/2018  . Acute encephalopathy 02/09/2018  . Acute hypoxemic respiratory failure ([place]) 02/05/2018  . Acute on chronic respiratory failure with hypoxia ([place]) 02/03/2018  . Infestation by bed bug 02/03/2018  . Urinary tract infection due to extended-spectrum beta lactamase (ESBL) producing Escherichia coli 02/02/2018  . Chronic a-fib 02/02/2018  .  Acute pyelonephritis 01/31/2018  . Constipation 01/31/2018  . Urolithiasis 01/30/2018  . Hydronephrosis with renal and ureteral calculus obstruction 01/30/2018  . Fall   . T12 compression fracture ([place]) 09/04/2017  . E coli bacteremia 07/03/2017  . Fall at home, initial encounter 07/03/2017  . Tachycardia 02/03/2017  . Sepsis secondary to UTI ([place]) 02/02/2017  . Chronic pain syndrome 02/02/2017  . Abnormal EKG 11/21/2013  . [name] (abdominal aortic aneurysm) ([place]) 09/30/2013  . OSA (obstructive sleep apnea) 06/29/2012  . Dementia ([place]) 06/29/2012  . Pre-diabetes 06/29/2012  . Hypoxia, sleep related 06/29/2012  . RA (rheumatoid  arthritis) ([place]) 06/29/2012  . Nonspecific abnormal results of cardiovascular function study 06/02/2011  . Nonspecific abnormal electrocardiogram (ECG) (EKG) 05/18/2011  . OSTEOPOROSIS 04/09/2008  . FLANK PAIN, RIGHT 03/04/2008  . Hyperlipidemia 05/12/2006  . PYELONEPHRITIS, ACUTE 05/12/2006  . DEPENDENT EDEMA, LEGS, BILATERAL 05/12/2006  . URINARY INCONTINENCE 05/12/2006  . PERIPHERAL NEUROPATHY 03/11/2006  . Essential hypertension 03/11/2006  . DYSURIA 03/11/2006    Past Surgical History:  Procedure Laterality Date  . APPENDECTOMY    . CARPAL TUNNEL RELEASE  12/27/2006   Right subcutaneous ulnar nerve transfer -- Right open carpal tunnel release  . CHOLECYSTECTOMY    . CYSTOSCOPY W/ URETERAL STENT PLACEMENT Right 01/30/2018   Procedure: CYSTOSCOPY WITH STENT REPLACEMENT retrograde pylegram;  Surgeon: Ardis Hughs, MD;  Location: [facility];  Service: Urology;  Laterality: Right;  . CYSTOSCOPY WITH URETEROSCOPY AND STENT PLACEMENT Right 03/01/2018   Procedure: CYSTOSCPY/RIGHT URETEROSCOPY HOLMIUM LASER LITHOTRIPSY AND STENT EXCHANGE;  Surgeon: Ardis Hughs, MD;  Location: [facility];  Service: Urology;  Laterality: Right;  . FOOT SURGERY    . HOLMIUM LASER APPLICATION Right 43/15/4008   Procedure: HOLMIUM LASER APPLICATION;  Surgeon: Ardis Hughs, MD;  Location: [facility];  Service: Urology;  Laterality: Right;  . JOINT REPLACEMENT     BTKR, RTHR  . TOTAL KNEE ARTHROPLASTY  08/21/2007   Right total knee replacement     OB History   None      Home Medications    Prior to Admission medications   Medication Sig Start Date End Date Taking? Authorizing Provider  acetaminophen (TYLENOL) 500 MG tablet Take 500 mg by mouth every 8 (eight) hours as needed for mild pain.    Yes [provider]  aspirin EC 81 MG tablet Take 81 mg by mouth daily.   Yes [provider]  atenolol (TENORMIN) 50 MG tablet Take 50 mg by mouth daily.   Yes [provider]  Cholecalciferol (VITAMIN D3) 2000 units TABS Take 2,000 Units by mouth daily.   Yes [provider]  DULoxetine (CYMBALTA) 30 MG capsule Take 90 mg by mouth daily.  07/29/14  Yes [provider]  gabapentin (NEURONTIN) 800 MG tablet Take 800-1,600 mg by mouth as directed. 800 mg in the morning, 800 mg at noon, 1600 mg at night 03/26/18  Yes [provider]  oxymorphone (OPANA) 5 MG tablet Take 1 tablet (5 mg total) by mouth every 8 (eight) hours as needed for pain. 02/26/18  Yes Tat, Shanon Brow, MD  rosuvastatin (CRESTOR) 40 MG tablet TAKE 1 TABLET BY MOUTH EVERY DAY Patient taking differently: Take 40 mg by mouth every evening.  12/25/17  Yes Pickard, [name], MD  nitrofurantoin, macrocrystal-monohydrate, (MACROBID) 100 MG capsule Take 1 capsule (100 mg total) by mouth 2 (two) times daily. Patient not taking: Reported on 04/06/2018 04/05/18   [name], MD  Family History Family History  Problem Relation Age of Onset  . Stroke Father   . Coronary artery disease Son        CABG at age 17  . Heart disease Son   . Heart attack Son   . Stroke Mother   . Hypertension Sister   . Diabetes Brother   . Hypertension Maternal Aunt     Social History Social History   Tobacco Use  . Smoking status: Never Smoker  . Smokeless tobacco: Never Used  Substance Use Topics  . Alcohol use: No  . Drug use: No     Allergies   Sulfonamide derivatives   Review of Systems Review of Systems  Constitutional: Negative for activity change.       All ROS Neg except as noted in HPI  HENT: Negative for nosebleeds.   Eyes: Negative for photophobia and discharge.  Respiratory: Positive for cough and shortness of breath. Negative for wheezing.   Cardiovascular: Positive for chest pain. Negative for palpitations.  Gastrointestinal: Negative for abdominal pain and blood in stool.  Genitourinary: Negative for dysuria, frequency and hematuria.  Musculoskeletal: Positive  for arthralgias and back pain. Negative for neck pain.  Skin: Negative.   Neurological: Negative for dizziness, seizures and speech difficulty.  Psychiatric/Behavioral: Negative for confusion and hallucinations.     Physical Exam Updated Vital Signs BP 111/79   Pulse 100   Temp 98 F (36.7 C) (Oral)   Resp 16   Wt 70.3 kg   SpO2 92%   BMI 30.27 kg/m   Physical Exam  Constitutional: She is oriented to person, place, and time. She appears well-developed and well-nourished.  Non-toxic appearance.  HENT:  Head: Normocephalic.  Right Ear: Tympanic membrane and external ear normal.  Left Ear: Tympanic membrane and external ear normal.  Eyes: Pupils are equal, round, and reactive to light. EOM and lids are normal.  Neck: Normal range of motion. Neck supple. Carotid bruit is not present.  Cardiovascular: Normal rate, regular rhythm, normal heart sounds, intact distal pulses and normal pulses.  Irregularly irregular at 110 bpm.  No rub or gallop appreciated.  Pulmonary/Chest: Breath sounds normal. No respiratory distress.  Few rhonchi present.  Few crackles noted.  There is symmetrical rise and fall of the chest.  Patient speaks clearly.  Abdominal: Soft. Bowel sounds are normal. There is no tenderness. There is no guarding.  Musculoskeletal: Normal range of motion.  There is decreased range of motion of the lower extremities, left more than right.  Patient states that she does not get around very much.  Lymphadenopathy:       Head (right side): No submandibular adenopathy present.       Head (left side): No submandibular adenopathy present.    She has no cervical adenopathy.  Neurological: She is alert and oriented to person, place, and time. She has normal strength. No cranial nerve deficit or sensory deficit.  Skin: Skin is warm and dry.  Psychiatric: She has a normal mood and affect. Her speech is normal.  Nursing note and vitals reviewed.    ED Treatments / Results   Labs (all labs ordered are listed, but only abnormal results are displayed) Labs Reviewed  BASIC METABOLIC PANEL - Abnormal; Notable for the following components:      Result Value   Glucose, Bld 109 (*)    Calcium 8.5 (*)    All other components within normal limits  CBC - Abnormal; Notable for the following components:  Hemoglobin 11.6 (*)    MCHC 29.7 (*)    All other components within normal limits  D-DIMER, QUANTITATIVE (NOT AT [facility]) - Abnormal; Notable for the following components:   D-Dimer, Quant 5.89 (*)    All other components within normal limits  TROPONIN I    EKG EKG Interpretation  Date/Time:  Friday April 06 2018 09:58:09 EST Ventricular Rate:  101 PR Interval:    QRS Duration: 78 QT Interval:  316 QTC Calculation: 410 R Axis:   38 Text Interpretation:  Atrial fibrillation Low voltage, precordial leads When compared with ECG of 03/27/2018 No significant change was found Confirmed by Francine Graven [phone]) on 04/06/2018 10:07:39 AM   Radiology Dg Chest 2 View  Result Date: 04/06/2018 CLINICAL DATA:  Intermittent right chest pain. EXAM: CHEST - 2 VIEW COMPARISON:  03/27/2018. FINDINGS: Stable enlarged cardiac silhouette. Interval patchy opacity in the left lower lobe with possible left pleural fluid. Clear right lung. Flattening of the hemidiaphragms and mildly prominent interstitial markings are again demonstrated. Thoracic spine degenerative changes and marked T12 compression deformity without significant change. IMPRESSION: 1. Left lower lobe pneumonia and possible left pleural effusion. 2. Stable cardiomegaly and changes of COPD. Electronically Signed   By: Claudie Revering M.D.   On: 04/06/2018 10:41   Ct Angio Chest Pe W And/or Wo Contrast  Result Date: 04/06/2018 CLINICAL DATA:  84 year old female with a history of right-sided chest pain EXAM: CT ANGIOGRAPHY CHEST WITH CONTRAST TECHNIQUE: Multidetector CT imaging of the chest was performed using the  standard protocol during bolus administration of intravenous contrast. Multiplanar CT image reconstructions and MIPs were obtained to evaluate the vascular anatomy. CONTRAST:  67mL ISOVUE-370 IOPAMIDOL (ISOVUE-370) INJECTION 76% COMPARISON:  02/04/2018 FINDINGS: Cardiovascular: Heart: Redemonstration of cardiomegaly. No pericardial fluid/thickening. Calcifications of left main, left anterior descending, circumflex, right coronary arteries. Aorta: Diameter of the ascending aorta relatively unchanged 38 mm. Atherosclerotic calcifications of the aortic arch. Calcifications of the branch vessels. Type 3 arch. Atherosclerotic changes of the descending thoracic aorta. Pulmonary arteries: No central, lobar, segmental, or proximal subsegmental filling defects. Mediastinum/Nodes: No mediastinal adenopathy. Unremarkable appearance of the thoracic inlet. Unremarkable thoracic esophagus.  No central airway debris. Lungs/Pleura: Atelectasis at the left lung base associated with pleural effusion. Trace right-sided pleural effusion. Respiratory motion somewhat limits evaluation, however, there's no significant interlobular septal thickening. Mild ground-glass geographic opacities of the lungs. Right lung relatively well aerated. Mild bronchial wall thickening. Upper Abdomen: No acute finding of the upper abdomen. Musculoskeletal: Redemonstration of T12 compression fracture, unchanged. Osteopenia. Degenerative changes of the thoracic spine. Review of the MIP images confirms the above findings. IMPRESSION: CT negative for pulmonary emboli. Left-sided pleural effusion with associated atelectasis. Trace right-sided pleural effusion. Aortic atherosclerosis with associated left main and 3 vessel coronary artery disease. Evidence small airway disease of the bilateral lungs, with no edema or evidence of lobar pneumonia. Aortic Atherosclerosis (ICD10-I70.0). Electronically Signed   By: Corrie Mckusick D.O.   On: 04/06/2018 13:18     Procedures Procedures (including critical care time)  Medications Ordered in ED Medications  iopamidol (ISOVUE-370) 76 % injection 75 mL (75 mLs Intravenous Contrast Given 04/06/18 1253)     Initial Impression / Assessment and Plan / ED Course  I have reviewed the triage vital signs and the nursing notes.  Pertinent labs & imaging results that were available during my care of the patient were reviewed by me and considered in my medical decision making (see chart for details).  Final Clinical Impressions(s) / ED Diagnoses\ MDM  Vital signs reviewed.  Patient has a tachycardia present.  Pulse oximetry ranging 89-90 on room air.  Recheck.  Patient states she is not having pain at this time.  The basic metabolic panel is within normal limits.  The d-dimer is elevated at 5.89.  The complete blood count shows the hemoglobin to be slightly low at 11.6, otherwise within normal limits.  No shift to the left noted.  Troponin is negative for acute event.  The electrocardiogram shows an atrial fibrillation present.  The chest x-ray question of the left lower lobe pneumonia and possible left pleural effusion.  The CT angios of the chest shows a left-sided pleural effusion with associated atelectasis trace right side pleural effusion.  The CT scan is negative for pulmonary embolism.  There is atherosclerotic coronary artery disease present.  Upon recheck, the pulse oximetry is 86 to a high of 90 on room air.  Heart rate is been elevated during most of the time here in the emergency department.  Urine analysis consistent with urinary tract infection.  The CT scan shows right-sided pleural effusion, but no pulmonary embolism.  Family suggest more confusion than usual.  Discussed possibility of admission with family, and they are in agreement.  Case discussed with [facility] hospitalist.  He will come down and evaluate the patient for possible admission.   Final diagnoses:  Encephalopathy   Pleural effusion  Urinary tract infection without hematuria, site unspecified    ED Discharge Orders    None       Lily Kocher, PA-C 04/06/18 [place], [place], DO 04/08/18 [phone]

## 2018-04-06 NOTE — ED Triage Notes (Addendum)
Pt reports intermittent right sided chest pain that is intermittent . Pain increases with deep breath. No radiation. Denies diaphoresis or light headedness. Occurring x 2 days. Pt currently has UTI and has not abt been filled

## 2018-04-06 NOTE — Progress Notes (Signed)
Pharmacy Antibiotic Note  AUBREANNA PERCLE is a 84 y.o. female admitted on 04/06/2018 with pneumonia.  Pharmacy has been consulted for vancomycin dosing.  Plan: Vancomycin 1500mg  iv x 1  Vancomycin 750mg  IV every 12 hours.  Goal trough 15-20 mcg/mL.  Weight: 155 lb (70.3 kg)  Temp (24hrs), Avg:98 F (36.7 C), Min:98 F (36.7 C), Max:98 F (36.7 C)  Recent Labs  Lab 04/03/18 0935 04/06/18 1000  WBC 10.6 10.5  CREATININE 0.94* 0.84    Estimated Creatinine Clearance: 45.2 mL/min (by C-G formula based on SCr of 0.84 mg/dL).    Allergies  Allergen Reactions  . Sulfonamide Derivatives Hives and Rash    Antimicrobials this admission: 12/6 cefepime x 1  12/6 vancomycin >>    Microbiology results: 12/6 MRSA PCR: sent   Thank you for allowing pharmacy to be a part of this patient's care.  Donna Christen Arlind Klingerman 04/06/2018 4:09 PM

## 2018-04-06 NOTE — ED Notes (Signed)
Patient was discharged from [facility] on Nov 18 per son in room

## 2018-04-06 NOTE — ED Notes (Signed)
Patient stated she was hungry. Pt unable to eat due to further testing.

## 2018-04-06 NOTE — ED Notes (Signed)
Pharmacy notified of Vancomycin, stated they are bringing it down now.

## 2018-04-06 NOTE — Progress Notes (Signed)
Pharmacy Antibiotic Note  [name] is a 84 y.o. female admitted on 04/06/2018 with UTI.  Pharmacy has been consulted for merrem dosing.  Plan: merrem 1gm iv q12h  Weight: 155 lb (70.3 kg)  Temp (24hrs), Avg:98 F (36.7 C), Min:98 F (36.7 C), Max:98 F (36.7 C)  Recent Labs  Lab 04/03/18 0935 04/06/18 1000  WBC 10.6 10.5  CREATININE 0.94* 0.84    Estimated Creatinine Clearance: 45.2 mL/min (by C-G formula based on SCr of 0.84 mg/dL).    Allergies  Allergen Reactions  . Sulfonamide Derivatives Hives and Rash    Antimicrobials this admission: 12/6 merrem >> 12/6 cefepime x 1   Microbiology results: 12/6 MRSA PCR: sent   Thank you for allowing pharmacy to be a part of this patient's care.  [name] [name] 04/06/2018 5:25 PM

## 2018-04-06 NOTE — H&P (Addendum)
History and Physical  [name] WUJ:[id number] DOB: Jan 19, 1934 DOA: 04/06/2018  Referring physician: Ivery Quale, PA-C, ED provider PCP: Donita Brooks, MD  Outpatient Specialists:   Patient Coming From: home  Chief Complaint: confusion, cough, chest pain  HPI: [name] is a 84 y.o. female with a history of dementia, chronic atrial fibrillation not on anticoagulation due to falls, depression, hyperlipidemia, hypertension, prediabetes, recent admission in October for urosepsis.  Patient was recently at [facility] and recently returned home.  She went to her PCPs office on Monday due to complaints of dysuria and right-sided chest pain.  Urinalysis showed a UTI, however the PCP did not treat her infection due to wanting cultures prior to starting treatment.  The patient presents today with cough and left-sided chest pain with deep inspiration.  Her son provides the history as the patient has dementia, who states that she has been more confused than normal.  She has been having Reiger's at home, but no fevers.  Her symptoms are worsening.  She has had a lack of appetite and has had little urine output, particularly as she has had minimal oral liquids.  Her son stated that he got a call from her PCPs office and had been prescribed macrobid, however he has not been able to pick up the antibiotic yet..  Emergency Department Course: Chest x-ray suggestive of left pneumonia, however CTA performed which showed left-sided atelectasis with left effusion.  White count 10.5.  First troponin normal.  BMP shows creatinine of 0.84.  Patient was started on vancomycin and cefepime for healthcare associated pneumonia.  Review of Systems:  Unable to obtain  Past Medical History:  Diagnosis Date  . [name] (abdominal aortic aneurysm) (HCC) 09/2013   3.6 cm  . Acute and chronic respiratory failure with hypoxia (HCC)   . Acute delirium   . Acute encephalopathy   . Acute pyelonephritis   . Atrial  fibrillation (HCC)   . Chronic pain syndrome   . Constipation   . Dementia (HCC)   . Dependent edema    bilateral legs   . Depression   . Diabetes mellitus    pre-diabetes  . Diverticulosis   . Diverticulosis   . Dysuria   . E coli bacteremia   . Fall   . Gross hematuria   . Hydronephrosis with renal and ureteral calculous obstruction   . Hyperlipidemia   . Hypertension   . Hypoxemia   . Idiopathic peripheral autonomic neuropathy   . Infestation by bed bug   . Nephrolithiasis   . OSA on CPAP   . Osteoarthritis   . Osteopenia    T=  -2.1 in hip  . Persistent mood (affective) disorder, unspecified (HCC)   . Prediabetes   . Rheumatoid arthritis (HCC)   . Rheumatoid arthritis(714.0)   . S/P carpal tunnel release   . Sepsis secondary to UTI (HCC)   . Spinal stenosis   . Unspecified fracture of fifth lumbar vertebra, initial encounter for closed fracture (HCC)   . Unspecified fracture of t11-T12 vertebra, initial encounter for closed fracture (HCC)   . Urinary incontinence   . Urinary tract infection    hx of   . Urolithiasis    Past Surgical History:  Procedure Laterality Date  . APPENDECTOMY    . CARPAL TUNNEL RELEASE  12/27/2006   Right subcutaneous ulnar nerve transfer -- Right open carpal tunnel release  . CHOLECYSTECTOMY    . CYSTOSCOPY W/ URETERAL STENT PLACEMENT Right 01/30/2018  Procedure: CYSTOSCOPY WITH STENT REPLACEMENT retrograde pylegram;  Surgeon: Crist Fat, MD;  Location: [facility];  Service: Urology;  Laterality: Right;  . CYSTOSCOPY WITH URETEROSCOPY AND STENT PLACEMENT Right 03/01/2018   Procedure: CYSTOSCPY/RIGHT URETEROSCOPY HOLMIUM LASER LITHOTRIPSY AND STENT EXCHANGE;  Surgeon: Crist Fat, MD;  Location: [facility];  Service: Urology;  Laterality: Right;  . FOOT SURGERY    . HOLMIUM LASER APPLICATION Right 03/01/2018   Procedure: HOLMIUM LASER APPLICATION;  Surgeon: Crist Fat, MD;  Location: [facility];  Service: Urology;   Laterality: Right;  . JOINT REPLACEMENT     BTKR, RTHR  . TOTAL KNEE ARTHROPLASTY  08/21/2007   Right total knee replacement   Social History:  reports that she has never smoked. She has never used smokeless tobacco. She reports that she does not drink alcohol or use drugs. Patient lives at home  Allergies  Allergen Reactions  . Sulfonamide Derivatives Hives and Rash    Family History  Problem Relation Age of Onset  . Stroke Father   . Coronary artery disease Son        CABG at age 73  . Heart disease Son   . Heart attack Son   . Stroke Mother   . Hypertension Sister   . Diabetes Brother   . Hypertension Maternal Aunt       Prior to Admission medications   Medication Sig Start Date End Date Taking? Authorizing Provider  acetaminophen (TYLENOL) 500 MG tablet Take 500 mg by mouth every 8 (eight) hours as needed for mild pain.    Yes [provider]  aspirin EC 81 MG tablet Take 81 mg by mouth daily.   Yes [provider]  atenolol (TENORMIN) 50 MG tablet Take 50 mg by mouth daily.   Yes [provider]  Cholecalciferol (VITAMIN D3) 2000 units TABS Take 2,000 Units by mouth daily.   Yes [provider]  DULoxetine (CYMBALTA) 30 MG capsule Take 90 mg by mouth daily.  07/29/14  Yes [provider]  gabapentin (NEURONTIN) 800 MG tablet Take 800-1,600 mg by mouth as directed. 800 mg in the morning, 800 mg at noon, 1600 mg at night 03/26/18  Yes [provider]  oxymorphone (OPANA) 5 MG tablet Take 1 tablet (5 mg total) by mouth every 8 (eight) hours as needed for pain. 02/26/18  Yes Tat, Onalee Hua, MD  rosuvastatin (CRESTOR) 40 MG tablet TAKE 1 TABLET BY MOUTH EVERY DAY Patient taking differently: Take 40 mg by mouth every evening.  12/25/17  Yes Pickard, Priscille Heidelberg, MD  dexamethasone (DECADRON) 4 MG tablet Take 1 tablet (4 mg total) by mouth 2 (two) times daily with a meal. 04/06/18   Ivery Quale, PA-C  nitrofurantoin,  macrocrystal-monohydrate, (MACROBID) 100 MG capsule Take 1 capsule (100 mg total) by mouth 2 (two) times daily. Patient not taking: Reported on 04/06/2018 04/05/18   Donita Brooks, MD  traMADol (ULTRAM) 50 MG tablet Take 1 tablet (50 mg total) by mouth every 6 (six) hours as needed. 04/06/18   Ivery Quale, PA-C    Physical Exam: BP 122/80   Pulse 100   Temp 98 F (36.7 C) (Oral)   Resp 17   Wt 70.3 kg   SpO2 93%   BMI 30.27 kg/m   . General: Ill appearing elderly Caucasian female.  [place], but arouses easily.  Patient oriented to self.  Her back is warm and clammy to touch.  No acute cardiopulmonary distress.  Marland Kitchen HEENT:  Normocephalic atraumatic.  Right and left ears normal in appearance.  Pupils equal, round, reactive to light. Extraocular muscles are intact. Sclerae anicteric and noninjected.  Moist mucosal membranes. No mucosal lesions.  . Neck: Neck supple without lymphadenopathy. No carotid bruits. No masses palpated.  . Cardiovascular: Regular rate with normal S1-S2 sounds. No murmurs, rubs, gallops auscultated. No JVD.  Marland Kitchen Respiratory: Rales on the left.  No accessory muscle use. . Abdomen: Soft, mild tenderness suprapubically.  No rebound or guarding. Nondistended. Active bowel sounds. No masses or hepatosplenomegaly  . Skin: No rashes, lesions, or ulcerations.  Dry, warm to touch. 2+ dorsalis pedis and radial pulses. . Musculoskeletal: No calf or leg pain. All major joints not erythematous nontender.  No upper or lower joint deformation.  Good ROM.  No contractures  . Psychiatric: Able to assess . Neurologic: Patient unable to comply with exam.           Labs on Admission: I have personally reviewed following labs and imaging studies  CBC: Recent Labs  Lab 04/03/18 0935 04/06/18 1000  WBC 10.6 10.5  NEUTROABS 6,975  --   HGB 12.8 11.6*  HCT 39.4 39.0  MCV 86.6 92.2  PLT 411* 362   Basic Metabolic Panel: Recent Labs  Lab 04/03/18 0935 04/06/18 1000  NA 135  136  K 4.9 4.6  CL 97* 101  CO2 26 27  GLUCOSE 142* 109*  BUN 15 18  CREATININE 0.94* 0.84  CALCIUM 9.3 8.5*   GFR: Estimated Creatinine Clearance: 45.2 mL/min (by C-G formula based on SCr of 0.84 mg/dL). Liver Function Tests: Recent Labs  Lab 04/03/18 0935  AST 15  ALT 8  BILITOT 0.8  PROT 6.4   No results for input(s): LIPASE, AMYLASE in the last 168 hours. No results for input(s): AMMONIA in the last 168 hours. Coagulation Profile: No results for input(s): INR, PROTIME in the last 168 hours. Cardiac Enzymes: Recent Labs  Lab 04/06/18 1000  TROPONINI <0.03   BNP (last 3 results) No results for input(s): PROBNP in the last 8760 hours. HbA1C: No results for input(s): HGBA1C in the last 72 hours. CBG: No results for input(s): GLUCAP in the last 168 hours. Lipid Profile: No results for input(s): CHOL, HDL, LDLCALC, TRIG, CHOLHDL, LDLDIRECT in the last 72 hours. Thyroid Function Tests: No results for input(s): TSH, T4TOTAL, FREET4, T3FREE, THYROIDAB in the last 72 hours. Anemia Panel: No results for input(s): VITAMINB12, FOLATE, FERRITIN, TIBC, IRON, RETICCTPCT in the last 72 hours. Urine analysis:    Component Value Date/Time   COLORURINE YELLOW 04/06/2018 1600   APPEARANCEUR HAZY (A) 04/06/2018 1600   LABSPEC 1.039 (H) 04/06/2018 1600   PHURINE 5.0 04/06/2018 1600   GLUCOSEU NEGATIVE 04/06/2018 1600   GLUCOSEU NEG mg/dL 16/01/9603 5409   HGBUR SMALL (A) 04/06/2018 1600   HGBUR negative 03/06/2008 0846   BILIRUBINUR NEGATIVE 04/06/2018 1600   KETONESUR NEGATIVE 04/06/2018 1600   PROTEINUR NEGATIVE 04/06/2018 1600   UROBILINOGEN 0.2 09/20/2013 1535   NITRITE NEGATIVE 04/06/2018 1600   LEUKOCYTESUR LARGE (A) 04/06/2018 1600   Sepsis Labs: @LABRCNTIP (procalcitonin:4,lacticidven:4) ) Recent Results (from the past 240 hour(s))  Urine Culture     Status: Abnormal   Collection Time: 04/03/18  9:46 AM  Result Value Ref Range Status   MICRO NUMBER: [id number]   Final   SPECIMEN QUALITY: Adequate  Final   Sample Source URINE, CLEAN CATCH  Final   STATUS: FINAL  Final   ISOLATE 1: Escherichia coli (A)  Final    Comment: 50,000-100,000 CFU/mL of Escherichia coli Escherichia coli (ESBL) ESBL RESULT:        The organism has been confirmed as an ESBL producer.      Susceptibility   Escherichia coli - URINE CULTURE, REFLEX    AMOX/CLAVULANIC 16 Intermediate     AMPICILLIN* >=32 Resistant      * Extended spectrum beta-lactamase (ESBL) producingorganisms demonstrate decreased activity withpenicillins, cephalosporins and aztreonam.    AMPICILLIN/SULBACTAM >=32 Resistant     CEFAZOLIN* >=64 Resistant      * Extended spectrum beta-lactamase (ESBL) producingorganisms demonstrate decreased activity withpenicillins, cephalosporins and aztreonam.For uncomplicated UTI caused by E. coli,K. pneumoniae or P. mirabilis: Cefazolin issusceptible if MIC <32 mcg/mL and predictssusceptible to the oral agents cefaclor, cefdinir,cefpodoxime, cefprozil, cefuroxime, cephalexinand loracarbef.    CEFEPIME  Resistant     CEFTRIAXONE >=64 Resistant     CIPROFLOXACIN >=4 Resistant     LEVOFLOXACIN >=8 Resistant     ERTAPENEM <=0.5 Sensitive     GENTAMICIN <=1 Sensitive     IMIPENEM <=0.25 Sensitive     NITROFURANTOIN <=16 Sensitive     PIP/TAZO 8 Sensitive     TOBRAMYCIN 8 Intermediate     TRIMETH/SULFA* >=320 Resistant      * Extended spectrum beta-lactamase (ESBL) producingorganisms demonstrate decreased activity withpenicillins, cephalosporins and aztreonam.For uncomplicated UTI caused by E. coli,K. pneumoniae or P. mirabilis: Cefazolin issusceptible if MIC <32 mcg/mL and predictssusceptible to the oral agents cefaclor, cefdinir,cefpodoxime, cefprozil, cefuroxime, cephalexinand loracarbef.Legend:S = Susceptible  I = IntermediateR = Resistant  NS = Not susceptible* = Not tested  NR = Not reported**NN = See antimicrobic comments     Radiological Exams on Admission: Dg Chest 2  View  Result Date: 04/06/2018 CLINICAL DATA:  Intermittent right chest pain. EXAM: CHEST - 2 VIEW COMPARISON:  03/27/2018. FINDINGS: Stable enlarged cardiac silhouette. Interval patchy opacity in the left lower lobe with possible left pleural fluid. Clear right lung. Flattening of the hemidiaphragms and mildly prominent interstitial markings are again demonstrated. Thoracic spine degenerative changes and marked T12 compression deformity without significant change. IMPRESSION: 1. Left lower lobe pneumonia and possible left pleural effusion. 2. Stable cardiomegaly and changes of COPD. Electronically Signed   By: Beckie Salts M.D.   On: 04/06/2018 10:41   Ct Angio Chest Pe W And/or Wo Contrast  Result Date: 04/06/2018 CLINICAL DATA:  84 year old female with a history of right-sided chest pain EXAM: CT ANGIOGRAPHY CHEST WITH CONTRAST TECHNIQUE: Multidetector CT imaging of the chest was performed using the standard protocol during bolus administration of intravenous contrast. Multiplanar CT image reconstructions and MIPs were obtained to evaluate the vascular anatomy. CONTRAST:  75mL ISOVUE-370 IOPAMIDOL (ISOVUE-370) INJECTION 76% COMPARISON:  02/04/2018 FINDINGS: Cardiovascular: Heart: Redemonstration of cardiomegaly. No pericardial fluid/thickening. Calcifications of left main, left anterior descending, circumflex, right coronary arteries. Aorta: Diameter of the ascending aorta relatively unchanged 38 mm. Atherosclerotic calcifications of the aortic arch. Calcifications of the branch vessels. Type 3 arch. Atherosclerotic changes of the descending thoracic aorta. Pulmonary arteries: No central, lobar, segmental, or proximal subsegmental filling defects. Mediastinum/Nodes: No mediastinal adenopathy. Unremarkable appearance of the thoracic inlet. Unremarkable thoracic esophagus.  No central airway debris. Lungs/Pleura: Atelectasis at the left lung base associated with pleural effusion. Trace right-sided pleural  effusion. Respiratory motion somewhat limits evaluation, however, there's no significant interlobular septal thickening. Mild ground-glass geographic opacities of the lungs. Right lung relatively well aerated. Mild bronchial wall thickening. Upper Abdomen: No acute finding of the upper abdomen. Musculoskeletal:  Redemonstration of T12 compression fracture, unchanged. Osteopenia. Degenerative changes of the thoracic spine. Review of the MIP images confirms the above findings. IMPRESSION: CT negative for pulmonary emboli. Left-sided pleural effusion with associated atelectasis. Trace right-sided pleural effusion. Aortic atherosclerosis with associated left main and 3 vessel coronary artery disease. Evidence small airway disease of the bilateral lungs, with no edema or evidence of lobar pneumonia. Aortic Atherosclerosis (ICD10-I70.0). Electronically Signed   By: Gilmer Mor D.O.   On: 04/06/2018 13:18    EKG: Independently reviewed.  Atrial fibrillation with mildly tachycardic rate.  No acute ST changes.  Assessment/Plan: Principal Problem:   Acute encephalopathy Active Problems:   Hyperlipidemia   Essential hypertension   Dementia (HCC)   Urinary tract infection due to extended-spectrum beta lactamase (ESBL) producing Escherichia coli   Chronic a-fib   Atelectasis of left lung    This patient was discussed with the ED physician, including pertinent vitals, physical exam findings, labs, and imaging.  We also discussed care given by the ED provider.  1. Acute encephalopathy a. Admit b. Secondary to UTI c. Check lactic acid d. Check troponin 2. Urinary tract infection due to ESBL E. Coli a. UTI was diagnosed on Monday, the patient has not received any treatment at all. b. Delay of treatment probably contributed greatly to the progression of her disease, although I appreciate the PCPs concern given her infection of ESBL E. coli with multiple resistance. c. We will start treating with meropenem  secondary to ESBL d. Urine culture from PCPs office shows the ESBL and sensitivities to [place] 3. Atelectasis a. Likely etiology of the patient's cough and pleurisy b. Although the patient was treated for pneumonia, CTA rules this out c. Incentive spirometry, as the patient is at high risk of having a pneumonia secondary to the atelectasis. 4. Dementia a.  5. Chronic A. Fib a. Rate controlled b. Not on anticoagulation secondary to falls 6. Hypertension a. Continue outpatient regimen 7. Hyperlipidemia a. Continue outpatient regimen  DVT prophylaxis: Lovenox Consultants: None Code Status: DNR Family Communication: Son Disposition Plan: Pending   Noralee Chars [facility] Pager [phone]  If 7PM-7AM, please contact night-coverage [email or web address] Password [email or web address]

## 2018-04-06 NOTE — ED Notes (Signed)
Patient transported to X-ray 

## 2018-04-07 ENCOUNTER — Encounter (HOSPITAL_COMMUNITY): Payer: Self-pay

## 2018-04-07 DIAGNOSIS — E785 Hyperlipidemia, unspecified: Secondary | ICD-10-CM

## 2018-04-07 LAB — BASIC METABOLIC PANEL
Anion gap: 9 (ref 5–15)
BUN: 14 mg/dL (ref 8–23)
CO2: 26 mmol/L (ref 22–32)
Calcium: 8.4 mg/dL — ABNORMAL LOW (ref 8.9–10.3)
Chloride: 102 mmol/L (ref 98–111)
Creatinine, Ser: 0.72 mg/dL (ref 0.44–1.00)
GFR calc Af Amer: >60 mL/min (ref >60)
GFR calc non Af Amer: >60 mL/min (ref >60)
Glucose, Bld: 97 mg/dL (ref 70–99)
Potassium: 3.9 mmol/L (ref 3.5–5.1)
Sodium: 137 mmol/L (ref 135–145)

## 2018-04-07 LAB — GLUCOSE, CAPILLARY
Glucose-Capillary: 102 mg/dL — ABNORMAL HIGH (ref 70–99)
Glucose-Capillary: 90 mg/dL (ref 70–99)
Glucose-Capillary: 87 mg/dL (ref 70–99)
Glucose-Capillary: 91 mg/dL (ref 70–99)

## 2018-04-07 LAB — CBC
HCT: 35.9 % — ABNORMAL LOW (ref 36.0–46.0)
Hemoglobin: 10.8 g/dL — ABNORMAL LOW (ref 12.0–15.0)
MCH: 27.2 pg (ref 26.0–34.0)
MCHC: 30.1 g/dL (ref 30.0–36.0)
MCV: 90.4 fL (ref 80.0–100.0)
Platelets: 344 10*3/uL (ref 150–400)
RBC: 3.97 MIL/uL (ref 3.87–5.11)
RDW: 15.1 % (ref 11.5–15.5)
WBC: 8.3 10*3/uL (ref 4.0–10.5)
nRBC: 0 % (ref 0.0–0.2)

## 2018-04-07 NOTE — Progress Notes (Signed)
PROGRESS NOTE    [name]  TJQ:[id number] DOB: 1933/12/24 DOA: 04/06/2018 PCP: [name], MD    Brief Narrative:  84 y.o. female with a history of dementia, chronic atrial fibrillation not on anticoagulation due to falls, depression, hyperlipidemia, hypertension, prediabetes, recent admission in October for urosepsis.  Patient was recently at [facility] and recently returned home.  She went to her PCPs office on Monday due to complaints of dysuria and right-sided chest pain.  Urinalysis showed a UTI, however the PCP did not treat her infection due to wanting cultures prior to starting treatment.  The patient presents today with cough and left-sided chest pain with deep inspiration.  Her son provides the history as the patient has dementia, who states that she has been more confused than normal.  She has been having Reiger's at home, but no fevers.  Her symptoms are worsening.  She has had a lack of appetite and has had little urine output, particularly as she has had minimal oral liquids.  Her son stated that he got a call from her PCPs office and had been prescribed macrobid, however he has not been able to pick up the antibiotic yet..  Assessment & Plan: 1 acute toxic encephalopathy: In the setting of UTI. -Improved after fluid resuscitation and antibiotics initiation -Continue supportive care -Continue treatment for UTI -Follow clinical response.  2-ESBL E. coli UTI:  -Following sensitivity from urine culture will continue treatment with meropenem  -continue IVF's -Continue as needed antiemetics and antibiotics. -Patient responding appropriately to treatment.  3-Hyperlipidemia -Continue statins  4-essential hypertension -Overall stable and well-controlled -Continue current antihypertensive regimen.  5-Dementia ([place]) with depression -Continue Cymbalta -Continue supportive care -According to family member mentation is close to baseline  6-Chronic a-fib -Stable  and well-controlled -Continue atenolol -Not a candidate for anticoagulation secondary to frequent falls -Will discontinue telemetry  7-Atelectasis of left lung -no signs of PNA -continue IS  DVT prophylaxis: Lovenox Code Status: DNR Family Communication: Daughter at bedside. Disposition Plan: Continue inpatient treatment, ask physical therapy to assess patient capability of performing activities; continue advancing diet and follow clinical response.  Continue IV fluids for now.  Will discontinue telemetry.  Consultants:   None  Procedures:   See below for x-ray reports.  Antimicrobials:  Anti-infectives (From admission, onward)   Start     Dose/Rate Route Frequency Ordered Stop   04/07/18 0600  vancomycin (VANCOCIN) IVPB 750 mg/150 ml premix  Status:  Discontinued     750 mg 150 mL/hr over 60 Minutes Intravenous Every 12 hours 04/06/18 1607 04/06/18 1657   04/06/18 1730  meropenem (MERREM) 1 g in sodium chloride 0.9 % 100 mL IVPB     1 g 200 mL/hr over 30 Minutes Intravenous Every 12 hours 04/06/18 1724     04/06/18 1615  vancomycin (VANCOCIN) 1,500 mg in sodium chloride 0.9 % 500 mL IVPB  Status:  Discontinued     1,500 mg 250 mL/hr over 120 Minutes Intravenous  Once 04/06/18 1605 04/06/18 1657   04/06/18 1600  ceFEPIme (MAXIPIME) 1 g in sodium chloride 0.9 % 100 mL IVPB     1 g 200 mL/hr over 30 Minutes Intravenous  Once 04/06/18 1545 04/06/18 1703      Subjective: Oriented x2; no fever, no nausea, no vomiting.  Feeling weak and tired and is still not having significant oral intake.  Objective: Vitals:   04/06/18 2001 04/06/18 2002 04/07/18 0516 04/07/18 1519  BP:  116/77 (!) 132/91 128/85  Pulse: (!) 102 89 93 93  Resp: 20 20 20    Temp: 98.4 F (36.9 C) 98.4 F (36.9 C) 98.3 F (36.8 C) 98.1 F (36.7 C)  TempSrc: Oral Oral Oral Oral  SpO2: 93% 92% 96% 96%  Weight:  68.8 kg    Height:  5' (1.524 m)      Intake/Output Summary (Last 24 hours) at 04/07/2018  1628 Last data filed at 04/07/2018 1306 Gross per 24 hour  Intake 1384.63 ml  Output 1001 ml  Net 383.63 ml   Filed Weights   04/06/18 0952 04/06/18 2002  Weight: 70.3 kg 68.8 kg    Examination: General exam: Alert, awake, oriented x 2 (missed time); no nausea, no vomiting, currently afebrile and overall feeling better.  She expressed to being weak and tired. Respiratory system: Clear to auscultation. Respiratory effort normal. Cardiovascular system:RRR. No murmurs, rubs, gallops. Gastrointestinal system: Abdomen is nondistended, soft and nontender. No organomegaly or masses felt. Normal bowel sounds heard. Central nervous system: Alert and oriented. No focal neurological deficits. Extremities: No cyanosis or clubbing. Skin: No rashes, lesions or ulcers Psychiatry: Judgement and insight appear normal. Mood & affect appropriate.     Data Reviewed: I have personally reviewed following labs and imaging studies  CBC: Recent Labs  Lab 04/03/18 0935 04/06/18 1000 04/07/18 0640  WBC 10.6 10.5 8.3  NEUTROABS 6,975  --   --   HGB 12.8 11.6* 10.8*  HCT 39.4 39.0 35.9*  MCV 86.6 92.2 90.4  PLT 411* 362 382   Basic Metabolic Panel: Recent Labs  Lab 04/03/18 0935 04/06/18 1000 04/07/18 0640  NA 135 136 137  K 4.9 4.6 3.9  CL 97* 101 102  CO2 26 27 26   GLUCOSE 142* 109* 97  BUN 15 18 14   CREATININE 0.94* 0.84 0.72  CALCIUM 9.3 8.5* 8.4*   GFR: Estimated Creatinine Clearance: 46.9 mL/min (by C-G formula based on SCr of 0.72 mg/dL).   Liver Function Tests: Recent Labs  Lab 04/03/18 0935  AST 15  ALT 8  BILITOT 0.8  PROT 6.4   Cardiac Enzymes: Recent Labs  Lab 04/06/18 1000 04/06/18 1718  TROPONINI <0.03 <0.03   CBG: Recent Labs  Lab 04/06/18 2106 04/07/18 0717 04/07/18 1114  GLUCAP 89 102* 90   Urine analysis:    Component Value Date/Time   COLORURINE YELLOW 04/06/2018 1600   APPEARANCEUR HAZY (A) 04/06/2018 1600   LABSPEC 1.039 (H) 04/06/2018 1600    PHURINE 5.0 04/06/2018 1600   GLUCOSEU NEGATIVE 04/06/2018 1600   GLUCOSEU NEG mg/dL 03/08/2007 2031   HGBUR SMALL (A) 04/06/2018 1600   HGBUR negative 03/06/2008 0846   BILIRUBINUR NEGATIVE 04/06/2018 1600   KETONESUR NEGATIVE 04/06/2018 1600   PROTEINUR NEGATIVE 04/06/2018 1600   UROBILINOGEN 0.2 09/20/2013 1535   NITRITE NEGATIVE 04/06/2018 1600   LEUKOCYTESUR LARGE (A) 04/06/2018 1600    Recent Results (from the past 240 hour(s))  Urine Culture     Status: Abnormal   Collection Time: 04/03/18  9:46 AM  Result Value Ref Range Status   MICRO NUMBER: [id number]  Final   SPECIMEN QUALITY: Adequate  Final   Sample Source URINE, CLEAN CATCH  Final   STATUS: FINAL  Final   ISOLATE 1: Escherichia coli (A)  Final    Comment: 50,000-100,000 CFU/mL of Escherichia coli Escherichia coli (ESBL) ESBL RESULT:        The organism has been confirmed as an ESBL producer.      Susceptibility   Escherichia  coli - URINE CULTURE, REFLEX    AMOX/CLAVULANIC 16 Intermediate     AMPICILLIN* >=32 Resistant      * Extended spectrum beta-lactamase (ESBL) producingorganisms demonstrate decreased activity withpenicillins, cephalosporins and aztreonam.    AMPICILLIN/SULBACTAM >=32 Resistant     CEFAZOLIN* >=64 Resistant      * Extended spectrum beta-lactamase (ESBL) producingorganisms demonstrate decreased activity withpenicillins, cephalosporins and aztreonam.For uncomplicated UTI caused by E. coli,K. pneumoniae or P. mirabilis: Cefazolin issusceptible if MIC <32 mcg/mL and predictssusceptible to the oral agents cefaclor, cefdinir,cefpodoxime, cefprozil, cefuroxime, cephalexinand loracarbef.    CEFEPIME  Resistant     CEFTRIAXONE >=64 Resistant     CIPROFLOXACIN >=4 Resistant     LEVOFLOXACIN >=8 Resistant     ERTAPENEM <=0.5 Sensitive     GENTAMICIN <=1 Sensitive     IMIPENEM <=0.25 Sensitive     NITROFURANTOIN <=16 Sensitive     PIP/TAZO 8 Sensitive     TOBRAMYCIN 8 Intermediate     TRIMETH/SULFA*  >=320 Resistant      * Extended spectrum beta-lactamase (ESBL) producingorganisms demonstrate decreased activity withpenicillins, cephalosporins and aztreonam.For uncomplicated UTI caused by E. coli,K. pneumoniae or P. mirabilis: Cefazolin issusceptible if MIC <32 mcg/mL and predictssusceptible to the oral agents cefaclor, cefdinir,cefpodoxime, cefprozil, cefuroxime, cephalexinand loracarbef.Legend:S = Susceptible  I = IntermediateR = Resistant  NS = Not susceptible* = Not tested  NR = Not reported**NN = See antimicrobic comments  MRSA PCR Screening     Status: None   Collection Time: 04/06/18  4:06 PM  Result Value Ref Range Status   MRSA by PCR NEGATIVE NEGATIVE Final    Comment:        The GeneXpert MRSA Assay (FDA approved for NASAL specimens only), is one component of a comprehensive MRSA colonization surveillance program. It is not intended to diagnose MRSA infection nor to guide or monitor treatment for MRSA infections. Performed at [facility], [address], [place], [place] [address]      Radiology Studies: Dg Chest 2 View  Result Date: 04/06/2018 CLINICAL DATA:  Intermittent right chest pain. EXAM: CHEST - 2 VIEW COMPARISON:  03/27/2018. FINDINGS: Stable enlarged cardiac silhouette. Interval patchy opacity in the left lower lobe with possible left pleural fluid. Clear right lung. Flattening of the hemidiaphragms and mildly prominent interstitial markings are again demonstrated. Thoracic spine degenerative changes and marked T12 compression deformity without significant change. IMPRESSION: 1. Left lower lobe pneumonia and possible left pleural effusion. 2. Stable cardiomegaly and changes of COPD. Electronically Signed   By: Claudie Revering M.D.   On: 04/06/2018 10:41   Ct Angio Chest Pe W And/or Wo Contrast  Result Date: 04/06/2018 CLINICAL DATA:  84 year old female with a history of right-sided chest pain EXAM: CT ANGIOGRAPHY CHEST WITH CONTRAST TECHNIQUE: Multidetector CT imaging  of the chest was performed using the standard protocol during bolus administration of intravenous contrast. Multiplanar CT image reconstructions and MIPs were obtained to evaluate the vascular anatomy. CONTRAST:  28mL ISOVUE-370 IOPAMIDOL (ISOVUE-370) INJECTION 76% COMPARISON:  02/04/2018 FINDINGS: Cardiovascular: Heart: Redemonstration of cardiomegaly. No pericardial fluid/thickening. Calcifications of left main, left anterior descending, circumflex, right coronary arteries. Aorta: Diameter of the ascending aorta relatively unchanged 38 mm. Atherosclerotic calcifications of the aortic arch. Calcifications of the branch vessels. Type 3 arch. Atherosclerotic changes of the descending thoracic aorta. Pulmonary arteries: No central, lobar, segmental, or proximal subsegmental filling defects. Mediastinum/Nodes: No mediastinal adenopathy. Unremarkable appearance of the thoracic inlet. Unremarkable thoracic esophagus.  No central airway debris. Lungs/Pleura: Atelectasis at  the left lung base associated with pleural effusion. Trace right-sided pleural effusion. Respiratory motion somewhat limits evaluation, however, there's no significant interlobular septal thickening. Mild ground-glass geographic opacities of the lungs. Right lung relatively well aerated. Mild bronchial wall thickening. Upper Abdomen: No acute finding of the upper abdomen. Musculoskeletal: Redemonstration of T12 compression fracture, unchanged. Osteopenia. Degenerative changes of the thoracic spine. Review of the MIP images confirms the above findings. IMPRESSION: CT negative for pulmonary emboli. Left-sided pleural effusion with associated atelectasis. Trace right-sided pleural effusion. Aortic atherosclerosis with associated left main and 3 vessel coronary artery disease. Evidence small airway disease of the bilateral lungs, with no edema or evidence of lobar pneumonia. Aortic Atherosclerosis (ICD10-I70.0). Electronically Signed   By: Corrie Mckusick  D.O.   On: 04/06/2018 13:18    Scheduled Meds: . aspirin EC  81 mg Oral Daily  . atenolol  50 mg Oral Daily  . DULoxetine  90 mg Oral Daily  . enoxaparin (LOVENOX) injection  40 mg Subcutaneous Q24H  . gabapentin  800 mg Oral BID   And  . gabapentin  1,600 mg Oral QHS  . rosuvastatin  40 mg Oral QPM   Continuous Infusions: . sodium chloride 100 mL/hr at 04/07/18 0650  . meropenem (MERREM) IV 1 g (04/07/18 1054)     LOS: 1 day    Time spent: 30 minutes.   Barton Dubois, MD [facility] Pager [phone]  If 7PM-7AM, please contact night-coverage [email or web address] Password [facility] 04/07/2018, 4:28 PM

## 2018-04-08 ENCOUNTER — Inpatient Hospital Stay (HOSPITAL_COMMUNITY): Payer: Medicare Other

## 2018-04-08 DIAGNOSIS — G9340 Encephalopathy, unspecified: Secondary | ICD-10-CM

## 2018-04-08 LAB — BASIC METABOLIC PANEL
Anion gap: 8 (ref 5–15)
BUN: 13 mg/dL (ref 8–23)
CO2: 24 mmol/L (ref 22–32)
Calcium: 8.1 mg/dL — ABNORMAL LOW (ref 8.9–10.3)
Chloride: 102 mmol/L (ref 98–111)
Creatinine, Ser: 0.71 mg/dL (ref 0.44–1.00)
GFR calc Af Amer: >60 mL/min (ref >60)
GFR calc non Af Amer: >60 mL/min (ref >60)
Glucose, Bld: 104 mg/dL — ABNORMAL HIGH (ref 70–99)
Potassium: 4 mmol/L (ref 3.5–5.1)
Sodium: 134 mmol/L — ABNORMAL LOW (ref 135–145)

## 2018-04-08 LAB — CBC
HCT: 34.3 % — ABNORMAL LOW (ref 36.0–46.0)
Hemoglobin: 10.4 g/dL — ABNORMAL LOW (ref 12.0–15.0)
MCH: 26.9 pg (ref 26.0–34.0)
MCHC: 30.3 g/dL (ref 30.0–36.0)
MCV: 88.9 fL (ref 80.0–100.0)
Platelets: 323 10*3/uL (ref 150–400)
RBC: 3.86 MIL/uL — ABNORMAL LOW (ref 3.87–5.11)
RDW: 14.7 % (ref 11.5–15.5)
WBC: 8.5 10*3/uL (ref 4.0–10.5)
nRBC: 0 % (ref 0.0–0.2)

## 2018-04-08 LAB — GLUCOSE, CAPILLARY
Glucose-Capillary: 97 mg/dL (ref 70–99)
Glucose-Capillary: 118 mg/dL — ABNORMAL HIGH (ref 70–99)
Glucose-Capillary: 118 mg/dL — ABNORMAL HIGH (ref 70–99)
Glucose-Capillary: 124 mg/dL — ABNORMAL HIGH (ref 70–99)

## 2018-04-08 LAB — TROPONIN I
Troponin I: <0.03 ng/mL (ref <0.03)
Troponin I: <0.03 ng/mL (ref <0.03)
Troponin I: <0.03 ng/mL (ref <0.03)

## 2018-04-08 MED ORDER — TRAZODONE HCL 50 MG PO TABS
50.0000 mg | ORAL_TABLET | Freq: Once | ORAL | Status: AC
  Administered 2018-04-08: 50 mg via ORAL
  Filled 2018-04-08: qty 1

## 2018-04-08 MED ORDER — ALUM & MAG HYDROXIDE-SIMETH 200-200-20 MG/5ML PO SUSP
30.0000 mL | Freq: Four times a day (QID) | ORAL | Status: DC | PRN
  Administered 2018-04-08 – 2018-04-09 (×2): 30 mL via ORAL
  Filled 2018-04-08 (×2): qty 30

## 2018-04-08 MED ORDER — PANTOPRAZOLE SODIUM 40 MG PO TBEC
40.0000 mg | DELAYED_RELEASE_TABLET | Freq: Every day | ORAL | Status: DC
  Administered 2018-04-08 – 2018-04-10 (×3): 40 mg via ORAL
  Filled 2018-04-08 (×3): qty 1

## 2018-04-08 MED ORDER — NITROGLYCERIN 0.4 MG SL SUBL
0.4000 mg | SUBLINGUAL_TABLET | SUBLINGUAL | Status: DC | PRN
  Administered 2018-04-08 (×3): 0.4 mg via SUBLINGUAL
  Filled 2018-04-08 (×2): qty 1

## 2018-04-08 MED ORDER — NITROGLYCERIN 0.4 MG SL SUBL
SUBLINGUAL_TABLET | SUBLINGUAL | Status: AC
  Filled 2018-04-08: qty 1

## 2018-04-08 MED ORDER — LIDOCAINE VISCOUS HCL 2 % MT SOLN
15.0000 mL | Freq: Four times a day (QID) | OROMUCOSAL | Status: DC | PRN
  Administered 2018-04-08 – 2018-04-09 (×2): 15 mL via ORAL
  Filled 2018-04-08 (×2): qty 15

## 2018-04-08 NOTE — Progress Notes (Signed)
Nitro SL given. EKG obtained. BP obtained and stable. Denies chest pain at this time. Will continue to monitor.

## 2018-04-08 NOTE — Progress Notes (Addendum)
Patient with development of central, aching, chest pain 8/10 tonight:  Serial trops ordered EKG stat ordered BP 131/93, will order SL NTG to see if this helps Will order repeat CXR  Noted patient admitted for L sided pleuritic CP, CTA chest neg for PE on 12/6.  Update 1: EKG reviewed: No obvious STEMI, A.Fib with R of 98, when compared to 2 days ago: has poor R wave progression today, but looks like she also had this a few EKGs ago back on 11/13.  Update 2: after 1 SL NTG, patient now denies CP.

## 2018-04-08 NOTE — Progress Notes (Signed)
Patient reporting chest pain rated at an 8. Patient describing the pain as an aching pain that does not radiate to arms. Patient stated that its an aching pain to the middle of her chest. Vital signs obtained and stable. Dr. Alcario Drought notified.

## 2018-04-08 NOTE — Progress Notes (Signed)
PROGRESS NOTE    [name]  HBZ:[id number] DOB: 1933/11/21 DOA: 04/06/2018 PCP: [name], MD    Brief Narrative:  84 y.o. female with a history of dementia, chronic atrial fibrillation not on anticoagulation due to falls, depression, hyperlipidemia, hypertension, prediabetes, recent admission in October for urosepsis.  Patient was recently at [facility] and recently returned home.  She went to her PCPs office on Monday due to complaints of dysuria and right-sided chest pain.  Urinalysis showed a UTI, however the PCP did not treat her infection due to wanting cultures prior to starting treatment.  The patient presents today with cough and left-sided chest pain with deep inspiration.  Her son provides the history as the patient has dementia, who states that she has been more confused than normal.  She has been having Reiger's at home, but no fevers.  Her symptoms are worsening.  She has had a lack of appetite and has had little urine output, particularly as she has had minimal oral liquids.  Her son stated that he got a call from her PCPs office and had been prescribed macrobid, however he has not been able to pick up the antibiotic yet..  Assessment & Plan: 1 acute toxic encephalopathy: In the setting of UTI. -Improved after fluid resuscitation and antibiotics initiation -Continue supportive care -Continue treatment for UTI -Follow clinical response. Raquel Sarna members her mentation is back to baseline.  2-ESBL E. coli UTI:  -Following sensitivity from urine culture will continue treatment with meropenem  -continue IVF's -Continue as needed antiemetics and antibiotics. -Patient responding appropriately to treatment.  3-Hyperlipidemia -Continue statins  4-essential hypertension -Overall stable and well-controlled -Continue current antihypertensive regimen.  5-Dementia ([name]) with depression -Continue Cymbalta -Continue supportive care -According to family member mentation  is now back to baseline  6-Chronic a-fib -Stable and well-controlled -Continue atenolol -Not a candidate for anticoagulation secondary to frequent falls -Will discontinue telemetry  7-Atelectasis of left lung -no signs of PNA -continue IS  8-chest pain -So far negative troponin and no acute ischemic changes on EKG (x2) -Will treat with PPI and Maalox -Follow clinical response.  DVT prophylaxis: Lovenox Code Status: DNR Family Communication: Daughter at bedside. Disposition Plan: Continue inpatient treatment, ask physical therapy to assess patient capability of performing activities; continue current IV antibiotics and discontinue IV fluids.  Finalize troponin cycling and initiate treatment for what appears to be reflux as cause of chest pain.  Will discharge in the next 24 to 48 hours.  Consultants:   None  Procedures:   See below for x-ray reports.  Antimicrobials:  Anti-infectives (From admission, onward)   Start     Dose/Rate Route Frequency Ordered Stop   04/07/18 0600  vancomycin (VANCOCIN) IVPB 750 mg/150 ml premix  Status:  Discontinued     750 mg 150 mL/hr over 60 Minutes Intravenous Every 12 hours 04/06/18 1607 04/06/18 1657   04/06/18 1730  meropenem (MERREM) 1 g in sodium chloride 0.9 % 100 mL IVPB     1 g 200 mL/hr over 30 Minutes Intravenous Every 12 hours 04/06/18 1724     04/06/18 1615  vancomycin (VANCOCIN) 1,500 mg in sodium chloride 0.9 % 500 mL IVPB  Status:  Discontinued     1,500 mg 250 mL/hr over 120 Minutes Intravenous  Once 04/06/18 1605 04/06/18 1657   04/06/18 1600  ceFEPIme (MAXIPIME) 1 g in sodium chloride 0.9 % 100 mL IVPB     1 g 200 mL/hr over 30 Minutes Intravenous  Once 04/06/18 1545 04/06/18 1703      Subjective: Mentation back to baseline, no fever, no nausea, no vomiting.  Patient reports no shortness of breath but expressed having intermittent episode of chest discomfort since yesterday night.  Objective: Vitals:   04/08/18  1034 04/08/18 1038 04/08/18 1047 04/08/18 1052  BP: 118/82 113/86 104/73 108/79  Pulse: 99 96 100 (!) 103  Resp:      Temp:      TempSrc:      SpO2: 96% 99% 90%   Weight:      Height:        Intake/Output Summary (Last 24 hours) at 04/08/2018 1431 Last data filed at 04/08/2018 1240 Gross per 24 hour  Intake 2243.5 ml  Output 600 ml  Net 1643.5 ml   Filed Weights   04/06/18 0952 04/06/18 2002  Weight: 70.3 kg 68.8 kg    Examination: General exam: Alert, awake, oriented x 2 (per daughter at bedside mentation at baseline).  No fever, no nausea, no vomiting.  Patient reported some intermittent episode of chest discomfort since yesterday. Respiratory system: Clear to auscultation. Respiratory effort normal. Cardiovascular system:Rate controlled. No murmurs, rubs, gallops; no JVD. Gastrointestinal system: Abdomen is nondistended, soft and nontender. No organomegaly or masses felt. Normal bowel sounds heard. Central nervous system: Alert and oriented. No focal neurological deficits. Extremities: No cyanosis no clubbing. Skin: No rashes, lesions or ulcers Psychiatry: Judgement and insight appear normal. Mood & affect appropriate.   Data Reviewed: I have personally reviewed following labs and imaging studies  CBC: Recent Labs  Lab 04/03/18 0935 04/06/18 1000 04/07/18 0640 04/08/18 0222  WBC 10.6 10.5 8.3 8.5  NEUTROABS 6,975  --   --   --   HGB 12.8 11.6* 10.8* 10.4*  HCT 39.4 39.0 35.9* 34.3*  MCV 86.6 92.2 90.4 88.9  PLT 411* 362 344 086   Basic Metabolic Panel: Recent Labs  Lab 04/03/18 0935 04/06/18 1000 04/07/18 0640 04/08/18 0222  NA 135 136 137 134*  K 4.9 4.6 3.9 4.0  CL 97* 101 102 102  CO2 26 27 26 24   GLUCOSE 142* 109* 97 104*  BUN 15 18 14 13   CREATININE 0.94* 0.84 0.72 0.71  CALCIUM 9.3 8.5* 8.4* 8.1*   GFR: Estimated Creatinine Clearance: 46.9 mL/min (by C-G formula based on SCr of 0.71 mg/dL).   Liver Function Tests: Recent Labs  Lab  04/03/18 0935  AST 15  ALT 8  BILITOT 0.8  PROT 6.4   Cardiac Enzymes: Recent Labs  Lab 04/06/18 1000 04/06/18 1718 04/08/18 0222 04/08/18 0824  TROPONINI <0.03 <0.03 <0.03 <0.03   CBG: Recent Labs  Lab 04/07/18 1114 04/07/18 1640 04/07/18 2146 04/08/18 0725 04/08/18 1128  GLUCAP 90 87 91 97 118*   Urine analysis:    Component Value Date/Time   COLORURINE YELLOW 04/06/2018 1600   APPEARANCEUR HAZY (A) 04/06/2018 1600   LABSPEC 1.039 (H) 04/06/2018 1600   PHURINE 5.0 04/06/2018 1600   GLUCOSEU NEGATIVE 04/06/2018 1600   GLUCOSEU NEG mg/dL 03/08/2007 2031   HGBUR SMALL (A) 04/06/2018 1600   HGBUR negative 03/06/2008 0846   BILIRUBINUR NEGATIVE 04/06/2018 1600   KETONESUR NEGATIVE 04/06/2018 1600   PROTEINUR NEGATIVE 04/06/2018 1600   UROBILINOGEN 0.2 09/20/2013 1535   NITRITE NEGATIVE 04/06/2018 1600   LEUKOCYTESUR LARGE (A) 04/06/2018 1600    Recent Results (from the past 240 hour(s))  Urine Culture     Status: Abnormal   Collection Time: 04/03/18  9:46 AM  Result Value Ref Range Status   MICRO NUMBER: [id number]  Final   SPECIMEN QUALITY: Adequate  Final   Sample Source URINE, CLEAN CATCH  Final   STATUS: FINAL  Final   ISOLATE 1: Escherichia coli (A)  Final    Comment: 50,000-100,000 CFU/mL of Escherichia coli Escherichia coli (ESBL) ESBL RESULT:        The organism has been confirmed as an ESBL producer.      Susceptibility   Escherichia coli - URINE CULTURE, REFLEX    AMOX/CLAVULANIC 16 Intermediate     AMPICILLIN* >=32 Resistant      * Extended spectrum beta-lactamase (ESBL) producingorganisms demonstrate decreased activity withpenicillins, cephalosporins and aztreonam.    AMPICILLIN/SULBACTAM >=32 Resistant     CEFAZOLIN* >=64 Resistant      * Extended spectrum beta-lactamase (ESBL) producingorganisms demonstrate decreased activity withpenicillins, cephalosporins and aztreonam.For uncomplicated UTI caused by E. coli,K. pneumoniae or P. mirabilis:  Cefazolin issusceptible if MIC <32 mcg/mL and predictssusceptible to the oral agents cefaclor, cefdinir,cefpodoxime, cefprozil, cefuroxime, cephalexinand loracarbef.    CEFEPIME  Resistant     CEFTRIAXONE >=64 Resistant     CIPROFLOXACIN >=4 Resistant     LEVOFLOXACIN >=8 Resistant     ERTAPENEM <=0.5 Sensitive     GENTAMICIN <=1 Sensitive     IMIPENEM <=0.25 Sensitive     NITROFURANTOIN <=16 Sensitive     PIP/TAZO 8 Sensitive     TOBRAMYCIN 8 Intermediate     TRIMETH/SULFA* >=320 Resistant      * Extended spectrum beta-lactamase (ESBL) producingorganisms demonstrate decreased activity withpenicillins, cephalosporins and aztreonam.For uncomplicated UTI caused by E. coli,K. pneumoniae or P. mirabilis: Cefazolin issusceptible if MIC <32 mcg/mL and predictssusceptible to the oral agents cefaclor, cefdinir,cefpodoxime, cefprozil, cefuroxime, cephalexinand loracarbef.Legend:S = Susceptible  I = IntermediateR = Resistant  NS = Not susceptible* = Not tested  NR = Not reported**NN = See antimicrobic comments  Urine Culture     Status: Abnormal (Preliminary result)   Collection Time: 04/06/18  4:00 PM  Result Value Ref Range Status   Specimen Description   Final    URINE, CATHETERIZED Performed at [facility], [address], [place], [place] [address]    Special Requests   Final    Normal Performed at [facility], [address] [place], [place] [address]    Culture >=100,000 COLONIES/mL ESCHERICHIA COLI (A)  Final   Report Status PENDING  Incomplete  MRSA PCR Screening     Status: None   Collection Time: 04/06/18  4:06 PM  Result Value Ref Range Status   MRSA by PCR NEGATIVE NEGATIVE Final    Comment:        The GeneXpert MRSA Assay (FDA approved for NASAL specimens only), is one component of a comprehensive MRSA colonization surveillance program. It is not intended to diagnose MRSA infection nor to guide or monitor treatment for MRSA infections. Performed at [facility],  [address] [place],  [address]      Radiology Studies: Dg Chest [facility] 1 View  Result Date: 04/08/2018 CLINICAL DATA:  Chest pain. EXAM: PORTABLE CHEST 1 VIEW COMPARISON:  04/06/2018 FINDINGS: Stable cardiomegaly. Increased opacity in both lung bases likely due to atelectasis. Left pleural effusion cannot be excluded. IMPRESSION: Increased bibasilar opacity, likely due to atelectasis. Left pleural effusion cannot be excluded. Stable cardiomegaly. Electronically Signed   By: Earle Gell M.D.   On: 04/08/2018 08:07    Scheduled Meds: . aspirin EC  81 mg Oral Daily  . atenolol  50 mg Oral Daily  . DULoxetine  90 mg Oral Daily  . enoxaparin (LOVENOX) injection  40 mg Subcutaneous Q24H  . gabapentin  800 mg Oral BID   And  . gabapentin  1,600 mg Oral QHS  . pantoprazole  40 mg Oral Daily  . rosuvastatin  40 mg Oral QPM   Continuous Infusions: . meropenem (MERREM) IV 1 g (04/08/18 1046)     LOS: 2 days    Time spent: 30 minutes.   Barton Dubois, MD [facility] Pager [phone]  If 7PM-7AM, please contact night-coverage [email or web address] Password [facility] 04/08/2018, 2:31 PM

## 2018-04-08 NOTE — Progress Notes (Signed)
Pharmacy Antibiotic Note  [name] is a 84 y.o. female admitted on 04/06/2018 with UTI.  Pharmacy has been consulted for merrem dosing.  E Coli on cultures >100,000, sensitivities not yet available   Plan: merrem 1gm iv q12h  Height: 5' (152.4 cm) Weight: 151 lb 10.8 oz (68.8 kg) IBW/kg (Calculated) : 45.5  Temp (24hrs), Avg:98.3 F (36.8 C), Min:98.1 F (36.7 C), Max:98.4 F (36.9 C)  Recent Labs  Lab 04/03/18 0935 04/06/18 1000 04/06/18 1718 04/07/18 0640 04/08/18 0222  WBC 10.6 10.5  --  8.3 8.5  CREATININE 0.94* 0.84  --  0.72 0.71  LATICACIDVEN  --   --  1.0  --   --     Estimated Creatinine Clearance: 46.9 mL/min (by C-G formula based on SCr of 0.71 mg/dL).    Allergies  Allergen Reactions  . Sulfonamide Derivatives Hives and Rash    Antimicrobials this admission: 12/6 merrem >> 12/6 cefepime x 1   Microbiology results: 12/6 MRSA PCR: sent   Thank you for allowing pharmacy to be a part of this patient's care.  Donna Christen Wm Fruchter 04/08/2018 12:19 PM

## 2018-04-09 LAB — CBC
HCT: 35.2 % — ABNORMAL LOW (ref 36.0–46.0)
Hemoglobin: 10.8 g/dL — ABNORMAL LOW (ref 12.0–15.0)
MCH: 27.3 pg (ref 26.0–34.0)
MCHC: 30.7 g/dL (ref 30.0–36.0)
MCV: 89.1 fL (ref 80.0–100.0)
Platelets: 355 10*3/uL (ref 150–400)
RBC: 3.95 MIL/uL (ref 3.87–5.11)
RDW: 14.8 % (ref 11.5–15.5)
WBC: 7.3 10*3/uL (ref 4.0–10.5)
nRBC: 0 % (ref 0.0–0.2)

## 2018-04-09 LAB — BASIC METABOLIC PANEL
Anion gap: 6 (ref 5–15)
BUN: 12 mg/dL (ref 8–23)
CO2: 27 mmol/L (ref 22–32)
Calcium: 8.3 mg/dL — ABNORMAL LOW (ref 8.9–10.3)
Chloride: 105 mmol/L (ref 98–111)
Creatinine, Ser: 0.73 mg/dL (ref 0.44–1.00)
GFR calc Af Amer: >60 mL/min (ref >60)
GFR calc non Af Amer: >60 mL/min (ref >60)
Glucose, Bld: 104 mg/dL — ABNORMAL HIGH (ref 70–99)
Potassium: 3.8 mmol/L (ref 3.5–5.1)
Sodium: 138 mmol/L (ref 135–145)

## 2018-04-09 LAB — GLUCOSE, CAPILLARY
Glucose-Capillary: 109 mg/dL — ABNORMAL HIGH (ref 70–99)
Glucose-Capillary: 127 mg/dL — ABNORMAL HIGH (ref 70–99)
Glucose-Capillary: 99 mg/dL (ref 70–99)
Glucose-Capillary: 91 mg/dL (ref 70–99)

## 2018-04-09 LAB — URINE CULTURE
Culture: >=100000 — AB
Special Requests: NORMAL

## 2018-04-09 NOTE — Progress Notes (Signed)
PROGRESS NOTE    [name]  WUJ:[id number] DOB: [birth year] DOA: 04/06/2018 PCP: Donita Brooks, MD   Brief Narrative:  Per HPI from Dr. Gwenlyn Perking: 84 y.o.femalewith a history of dementia, chronic atrial fibrillation not on anticoagulation due to falls, depression, hyperlipidemia, hypertension, prediabetes, recent admission in October for urosepsis. Patient was recently at [facility] and recently returned home. She went to her PCPs office on Monday due to complaints of dysuria and right-sided chest pain. Urinalysis showed a UTI, however the PCP did not treat her infection due to wanting cultures prior to starting treatment. The patient presents today with cough and left-sided chest pain with deep inspiration. Her son provides the history as the patient has dementia,who states that she has beenmore confused than normal. She has been having Reiger's at home, but no fevers. Her symptoms are worsening. She has had a lack of appetite and has had little urine output, particularly as she has had minimal oral liquids.  She has been admitted with acute, toxic encephalopathy in the setting of ESBL E. coli UTI.  Her mentation has been improving.  She did have some mild chest pain and shortness of breath yesterday which appears to be atypical and has resolved this morning with no signs of ACS.  Assessment & Plan:   Principal Problem:   Acute encephalopathy Active Problems:   Hyperlipidemia   Essential hypertension   Dementia (HCC)   Urinary tract infection due to extended-spectrum beta lactamase (ESBL) producing Escherichia coli   Chronic a-fib   Atelectasis of left lung  1 acute toxic encephalopathy: In the setting of UTI-resolving -Improved after fluid resuscitation and antibiotics initiation -Continue supportive care -Continue treatment for UTI  2-ESBL E. coli UTI:  -Following sensitivity from urine culture will continue treatment with meropenem  -continue IVF's -Continue as  needed antiemetics and antibiotics with Merrem for one more day. -Patient responding appropriately to treatment.  3-Hyperlipidemia -Continue statins  4-essential hypertension -Overall stable and well-controlled -Continue current antihypertensive regimen.  5-Dementia (HCC) with depression -Continue Cymbalta -Continue supportive care -According to family member mentation is now back to baseline  6-Chronic a-fib -Stable and well-controlled -Continue atenolol -Not a candidate for anticoagulation secondary to frequent falls -Will discontinue telemetry  7-Atelectasis of left lung -no signs of PNA -continue IS  8-chest pain -So far negative troponin and no acute ischemic changes on EKG (x2) -Will treat with PPI and Maalox -Follow clinical response.   DVT prophylaxis: Lovenox Code Status: DNR Family Communication: Son at bedside Disposition Plan: Continue inpatient treatment with Merrem to complete 5 days of treatment and have PT assess today to ensure that patient is safe for discharge.  Likely discharge by tomorrow after completing 5 days of treatment on Merrem.   Consultants:   None  Procedures:   None  Antimicrobials:   Merrem 12/6->   Subjective: Patient seen and evaluated today with no new acute complaints or concerns. No acute concerns or events noted overnight.  She has minimal pain to her right chest that is currently improved.  Objective: Vitals:   04/08/18 1052 04/08/18 1450 04/08/18 2156 04/09/18 0623  BP: 108/79 122/88 125/86 132/80  Pulse: (!) 103 99 85 92  Resp:  18 16 16   Temp:  99.1 F (37.3 C) 98.5 F (36.9 C) 97.6 F (36.4 C)  TempSrc:  Oral Oral Oral  SpO2:  100% 97% 96%  Weight:      Height:        Intake/Output Summary (Last 24  hours) at 04/09/2018 1037 Last data filed at 04/09/2018 0525 Gross per 24 hour  Intake 580 ml  Output 1200 ml  Net -620 ml   Filed Weights   04/06/18 0952 04/06/18 2002  Weight: 70.3 kg 68.8 kg     Examination:  General exam: Appears calm and comfortable  Respiratory system: Clear to auscultation. Respiratory effort normal. Cardiovascular system: S1 & S2 heard, RRR. No JVD, murmurs, rubs, gallops or clicks. No pedal edema. Gastrointestinal system: Abdomen is nondistended, soft and nontender. No organomegaly or masses felt. Normal bowel sounds heard. Central nervous system: Alert and oriented. No focal neurological deficits. Extremities: Symmetric 5 x 5 power. Skin: No rashes, lesions or ulcers Psychiatry: Judgement and insight appear normal. Mood & affect appropriate.     Data Reviewed: I have personally reviewed following labs and imaging studies  CBC: Recent Labs  Lab 04/03/18 0935 04/06/18 1000 04/07/18 0640 04/08/18 0222 04/09/18 0538  WBC 10.6 10.5 8.3 8.5 7.3  NEUTROABS 6,975  --   --   --   --   HGB 12.8 11.6* 10.8* 10.4* 10.8*  HCT 39.4 39.0 35.9* 34.3* 35.2*  MCV 86.6 92.2 90.4 88.9 89.1  PLT 411* 362 344 323 355   Basic Metabolic Panel: Recent Labs  Lab 04/03/18 0935 04/06/18 1000 04/07/18 0640 04/08/18 0222 04/09/18 0538  NA 135 136 137 134* 138  K 4.9 4.6 3.9 4.0 3.8  CL 97* 101 102 102 105  CO2 26 27 26 24 27   GLUCOSE 142* 109* 97 104* 104*  BUN 15 18 14 13 12   CREATININE 0.94* 0.84 0.72 0.71 0.73  CALCIUM 9.3 8.5* 8.4* 8.1* 8.3*   GFR: Estimated Creatinine Clearance: 46.9 mL/min (by C-G formula based on SCr of 0.73 mg/dL). Liver Function Tests: Recent Labs  Lab 04/03/18 0935  AST 15  ALT 8  BILITOT 0.8  PROT 6.4   No results for input(s): LIPASE, AMYLASE in the last 168 hours. No results for input(s): AMMONIA in the last 168 hours. Coagulation Profile: No results for input(s): INR, PROTIME in the last 168 hours. Cardiac Enzymes: Recent Labs  Lab 04/06/18 1000 04/06/18 1718 04/08/18 0222 04/08/18 0824 04/08/18 1438  TROPONINI <0.03 <0.03 <0.03 <0.03 <0.03   BNP (last 3 results) No results for input(s): PROBNP in the  last 8760 hours. HbA1C: No results for input(s): HGBA1C in the last 72 hours. CBG: Recent Labs  Lab 04/08/18 0725 04/08/18 1128 04/08/18 1631 04/08/18 2159 04/09/18 0754  GLUCAP 97 118* 118* 124* 109*   Lipid Profile: No results for input(s): CHOL, HDL, LDLCALC, TRIG, CHOLHDL, LDLDIRECT in the last 72 hours. Thyroid Function Tests: No results for input(s): TSH, T4TOTAL, FREET4, T3FREE, THYROIDAB in the last 72 hours. Anemia Panel: No results for input(s): VITAMINB12, FOLATE, FERRITIN, TIBC, IRON, RETICCTPCT in the last 72 hours. Sepsis Labs: Recent Labs  Lab 04/06/18 1718  LATICACIDVEN 1.0    Recent Results (from the past 240 hour(s))  Urine Culture     Status: Abnormal   Collection Time: 04/03/18  9:46 AM  Result Value Ref Range Status   MICRO NUMBER: [id number]  Final   SPECIMEN QUALITY: Adequate  Final   Sample Source URINE, CLEAN CATCH  Final   STATUS: FINAL  Final   ISOLATE 1: Escherichia coli (A)  Final    Comment: 50,000-100,000 CFU/mL of Escherichia coli Escherichia coli (ESBL) ESBL RESULT:        The organism has been confirmed as an ESBL producer.  Susceptibility   Escherichia coli - URINE CULTURE, REFLEX    AMOX/CLAVULANIC 16 Intermediate     AMPICILLIN* >=32 Resistant      * Extended spectrum beta-lactamase (ESBL) producingorganisms demonstrate decreased activity withpenicillins, cephalosporins and aztreonam.    AMPICILLIN/SULBACTAM >=32 Resistant     CEFAZOLIN* >=64 Resistant      * Extended spectrum beta-lactamase (ESBL) producingorganisms demonstrate decreased activity withpenicillins, cephalosporins and aztreonam.For uncomplicated UTI caused by E. coli,K. pneumoniae or P. mirabilis: Cefazolin issusceptible if MIC <32 mcg/mL and predictssusceptible to the oral agents cefaclor, cefdinir,cefpodoxime, cefprozil, cefuroxime, cephalexinand loracarbef.    CEFEPIME  Resistant     CEFTRIAXONE >=64 Resistant     CIPROFLOXACIN >=4 Resistant     LEVOFLOXACIN >=8  Resistant     ERTAPENEM <=0.5 Sensitive     GENTAMICIN <=1 Sensitive     IMIPENEM <=0.25 Sensitive     NITROFURANTOIN <=16 Sensitive     PIP/TAZO 8 Sensitive     TOBRAMYCIN 8 Intermediate     TRIMETH/SULFA* >=320 Resistant      * Extended spectrum beta-lactamase (ESBL) producingorganisms demonstrate decreased activity withpenicillins, cephalosporins and aztreonam.For uncomplicated UTI caused by E. coli,K. pneumoniae or P. mirabilis: Cefazolin issusceptible if MIC <32 mcg/mL and predictssusceptible to the oral agents cefaclor, cefdinir,cefpodoxime, cefprozil, cefuroxime, cephalexinand loracarbef.Legend:S = Susceptible  I = IntermediateR = Resistant  NS = Not susceptible* = Not tested  NR = Not reported**NN = See antimicrobic comments  Urine Culture     Status: Abnormal   Collection Time: 04/06/18  4:00 PM  Result Value Ref Range Status   Specimen Description   Final    URINE, CATHETERIZED Performed at [facility], [address], [place], [place] [address]    Special Requests   Final    Normal Performed at [facility], [address] [place], [place] [address]    Culture (A)  Final    >=100,000 COLONIES/mL ESCHERICHIA COLI Confirmed Extended Spectrum Beta-Lactamase Producer (ESBL).  In bloodstream infections from ESBL organisms, carbapenems are preferred over piperacillin/tazobactam. They are shown to have a lower risk of mortality.    Report Status 04/09/2018 FINAL  Final   Organism ID, Bacteria ESCHERICHIA COLI (A)  Final      Susceptibility   Escherichia coli - MIC*    AMPICILLIN >=32 RESISTANT Resistant     CEFAZOLIN >=64 RESISTANT Resistant     CEFTRIAXONE >=64 RESISTANT Resistant     CIPROFLOXACIN >=4 RESISTANT Resistant     GENTAMICIN <=1 SENSITIVE Sensitive     IMIPENEM <=0.25 SENSITIVE Sensitive     NITROFURANTOIN <=16 SENSITIVE Sensitive     TRIMETH/SULFA >=320 RESISTANT Resistant     AMPICILLIN/SULBACTAM >=32 RESISTANT Resistant     PIP/TAZO 64 INTERMEDIATE  Intermediate     Extended ESBL POSITIVE Resistant     * >=100,000 COLONIES/mL ESCHERICHIA COLI  MRSA PCR Screening     Status: None   Collection Time: 04/06/18  4:06 PM  Result Value Ref Range Status   MRSA by PCR NEGATIVE NEGATIVE Final    Comment:        The GeneXpert MRSA Assay (FDA approved for NASAL specimens only), is one component of a comprehensive MRSA colonization surveillance program. It is not intended to diagnose MRSA infection nor to guide or monitor treatment for MRSA infections. Performed at [facility], [address], [place], [place] [address]          Radiology Studies: Dg Chest [facility] 1 View  Result Date: 04/08/2018 CLINICAL DATA:  Chest pain. EXAM: PORTABLE CHEST 1 VIEW COMPARISON:  04/06/2018 FINDINGS: Stable cardiomegaly. Increased opacity in both lung bases likely due to atelectasis. Left pleural effusion cannot be excluded. IMPRESSION: Increased bibasilar opacity, likely due to atelectasis. Left pleural effusion cannot be excluded. Stable cardiomegaly. Electronically Signed   By: Myles Rosenthal M.D.   On: 04/08/2018 08:07        Scheduled Meds: . aspirin EC  81 mg Oral Daily  . atenolol  50 mg Oral Daily  . DULoxetine  90 mg Oral Daily  . enoxaparin (LOVENOX) injection  40 mg Subcutaneous Q24H  . gabapentin  800 mg Oral BID   And  . gabapentin  1,600 mg Oral QHS  . pantoprazole  40 mg Oral Daily  . rosuvastatin  40 mg Oral QPM   Continuous Infusions: . meropenem (MERREM) IV 1 g (04/08/18 2143)     LOS: 3 days    Time spent: 30 minutes    Briana Farner Hoover Brunette, DO [facility] Pager [phone]  If 7PM-7AM, please contact night-coverage [email or web address] Password [email or web address] 04/09/2018, 10:37 AM

## 2018-04-09 NOTE — Plan of Care (Signed)
  Problem: Acute Rehab PT Goals(only PT should resolve) Goal: Pt Will Go Supine/Side To Sit Outcome: Progressing Flowsheets (Taken 04/09/2018 1222) Pt will go Supine/Side to Sit: with modified independence Goal: Patient Will Transfer Sit To/From Stand Outcome: Progressing Flowsheets (Taken 04/09/2018 1222) Patient will transfer sit to/from stand: with supervision Goal: Pt Will Transfer Bed To Chair/Chair To Bed Outcome: Progressing Flowsheets (Taken 04/09/2018 1222) Pt will Transfer Bed to Chair/Chair to Bed: with supervision Goal: Pt Will Ambulate Outcome: Progressing Flowsheets (Taken 04/09/2018 1222) Pt will Ambulate: 50 feet; with min guard assist; with rolling walker   12:23 PM, 04/09/18 Lonell Grandchild, MPT Physical Therapist with [facility] [phone] office [phone] mobile phone

## 2018-04-09 NOTE — Care Management Note (Addendum)
Case Management Note  Patient Details  Name: [name] MRN: [id number] Date of Birth: [birth year]  Subjective/Objective:    Patient from home with acute encephalopathy. Lives with sons, has 24/7 care. Active with [facility] for RN and PT services.    Has walker - 4 wheels; Bedside commode;Hospital bed;Wheelchair - manual pta.         Action/Plan: DC home with resumption of home health. Cassie of [facility] aware of patient admission. Will need resumption orders.   Expected Discharge Date:    04/10/2018              Expected Discharge Plan:  [place]  In-House Referral:     Discharge planning Services  CM Consult  Post Acute Care Choice:  Home Health, Resumption of Svcs/PTA Provider Choice offered to:  Patient  DME Arranged:    DME Agency:     HH Arranged:    [place] Agency:  [facility]  Status of Service:  Completed, signed off  If discussed at [place] of Stay Meetings, dates discussed:    Additional Comments:  Markela Wee, Chauncey Reading, RN 04/09/2018, 2:11 PM

## 2018-04-09 NOTE — Evaluation (Signed)
Physical Therapy Evaluation Patient Details Name: [name] MRN: [id number] DOB: 22-Apr-1934 Today's Date: 04/09/2018   History of Present Illness   [name] is a 84 y.o. female with a history of dementia, chronic atrial fibrillation not on anticoagulation due to falls, depression, hyperlipidemia, hypertension, prediabetes, recent admission in October for urosepsis.  Patient was recently at [facility] and recently returned home.  She went to her PCPs office on Monday due to complaints of dysuria and right-sided chest pain.  Urinalysis showed a UTI, however the PCP did not treat her infection due to wanting cultures prior to starting treatment.  The patient presents today with cough and left-sided chest pain with deep inspiration.  Her son provides the history as the patient has dementia, who states that she has been more confused than normal.  She has been having Reiger's at home, but no fevers.  Her symptoms are worsening.  She has had a lack of appetite and has had little urine output, particularly as she has had minimal oral liquids.    Clinical Impression  Patient functioning near baseline for functional mobility and gait, demonstrates slightly labored movement for sitting up at bedside, transfers and walking to bathroom, able to transfer to commode in bathroom using grab bars without loss of balance and tolerated sitting up in chair after therapy.  Patient will benefit from continued physical therapy in hospital and recommended venue below to increase strength, balance, endurance for safe ADLs and gait.    Follow Up Recommendations Home health PT;Supervision for mobility/OOB;Supervision - Intermittent    Equipment Recommendations  None recommended by PT    Recommendations for Other Services       Precautions / Restrictions Precautions Precautions: Fall Restrictions Weight Bearing Restrictions: No      Mobility  Bed Mobility Overal bed mobility: Needs Assistance Bed  Mobility: Supine to Sit     Supine to sit: Supervision     General bed mobility comments: had to use bed rail  Transfers Overall transfer level: Needs assistance Equipment used: Rolling walker (2 wheeled) Transfers: Sit to/from [facility] Sit to Stand: Min guard Stand pivot transfers: Min guard          Ambulation/Gait Ambulation/Gait assistance: Min guard Gait Distance (Feet): 15 Feet Assistive device: Rolling walker (2 wheeled) Gait Pattern/deviations: Decreased step length - right;Decreased step length - left;Decreased stride length Gait velocity: decreased   General Gait Details: slow labored cadence without loss of balance, limited secondary to c/o fatigue  Stairs            Wheelchair Mobility    Modified Rankin (Stroke Patients Only)       Balance Overall balance assessment: Needs assistance Sitting-balance support: Feet supported Sitting balance-Leahy Scale: Good     Standing balance support: Bilateral upper extremity supported;During functional activity Standing balance-Leahy Scale: Fair Standing balance comment: using RW                             Pertinent Vitals/Pain Pain Assessment: No/denies pain    Home Living Family/patient expects to be discharged to:: Private residence Living Arrangements: Children Available Help at Discharge: Family;Available 24 hours/day Type of Home: Mobile home Home Access: Stairs to enter Entrance Stairs-Rails: Right;Left;Can reach both Entrance Stairs-Number of Steps: 3 Home Layout: One level Home Equipment: Walker - 4 wheels;Bedside commode;Hospital bed;Wheelchair - manual Additional Comments: lives with 2 sons    Prior Function Level of Independence: Needs  assistance   Gait / Transfers Assistance Needed: household ambulator with Rollator, assisted for going up/down steps  ADL's / Homemaking Assistance Needed: assisted by family        Hand Dominance         Extremity/Trunk Assessment   Upper Extremity Assessment Upper Extremity Assessment: Generalized weakness    Lower Extremity Assessment Lower Extremity Assessment: Generalized weakness    Cervical / Trunk Assessment Cervical / Trunk Assessment: Normal  Communication   Communication: No difficulties  Cognition Arousal/Alertness: Awake/alert Behavior During Therapy: WFL for tasks assessed/performed Overall Cognitive Status: Within Functional Limits for tasks assessed                                        General Comments      Exercises     Assessment/Plan    PT Assessment Patient needs continued PT services  PT Problem List Decreased strength;Decreased activity tolerance;Decreased balance;Decreased mobility       PT Treatment Interventions Gait training;Stair training;Functional mobility training;Therapeutic activities;Therapeutic exercise;Patient/family education    PT Goals (Current goals can be found in the Care Plan section)  Acute Rehab PT Goals Patient Stated Goal: return home with family to assist PT Goal Formulation: With patient Time For Goal Achievement: 04/16/18 Potential to Achieve Goals: Good    Frequency Min 3X/week   Barriers to discharge        Co-evaluation               AM-PAC PT "6 Clicks" Mobility  Outcome Measure Help needed turning from your back to your side while in a flat bed without using bedrails?: None Help needed moving from lying on your back to sitting on the side of a flat bed without using bedrails?: None Help needed moving to and from a bed to a chair (including a wheelchair)?: A Little Help needed standing up from a chair using your arms (e.g., wheelchair or bedside chair)?: A Little Help needed to walk in hospital room?: A Little Help needed climbing 3-5 steps with a railing? : A Lot 6 Click Score: 19    End of Session   Activity Tolerance: Patient tolerated treatment well;Patient limited by  fatigue Patient left: in chair;with call bell/phone within reach Nurse Communication: Mobility status PT Visit Diagnosis: Unsteadiness on feet (R26.81);Other abnormalities of gait and mobility (R26.89);Muscle weakness (generalized) (M62.81)    Time: 4403-4742 PT Time Calculation (min) (ACUTE ONLY): 27 min   Charges:   PT Evaluation $PT Eval Moderate Complexity: 1 Mod PT Treatments $Therapeutic Activity: 23-37 mins        12:21 PM, 04/09/18 Lonell Grandchild, MPT Physical Therapist with [facility] [phone] office [phone] mobile phone

## 2018-04-10 LAB — GLUCOSE, CAPILLARY: Glucose-Capillary: 93 mg/dL (ref 70–99)

## 2018-04-10 MED ORDER — PANTOPRAZOLE SODIUM 40 MG PO TBEC: 40.0000 mg | DELAYED_RELEASE_TABLET | Freq: Every day | ORAL | 0 refills | Status: DC

## 2018-04-10 NOTE — Progress Notes (Signed)
Physical Therapy Treatment Patient Details Name: [name] MRN: [id number] DOB: June 11, 1933 Today's Date: 04/10/2018    History of Present Illness  [name] is a 84 y.o. female with a history of dementia, chronic atrial fibrillation not on anticoagulation due to falls, depression, hyperlipidemia, hypertension, prediabetes, recent admission in October for urosepsis.  Patient was recently at [facility] and recently returned home.  She went to her PCPs office on Monday due to complaints of dysuria and right-sided chest pain.  Urinalysis showed a UTI, however the PCP did not treat her infection due to wanting cultures prior to starting treatment.  The patient presents today with cough and left-sided chest pain with deep inspiration.  Her son provides the history as the patient has dementia, who states that she has been more confused than normal.  She has been having Reiger's at home, but no fevers.  Her symptoms are worsening.  She has had a lack of appetite and has had little urine output, particularly as she has had minimal oral liquids.    PT Comments    Patient demonstrates increased endurance/distance for gait training without loss of balance, limited mostly due to c/o low back pain that resolved after sitting in chair.  Patient requires frequent verbal cueing and demonstration to complete BLE strengthening/ROM exercises while seated in chair and tolerated staying up in chair with her son present in room after therapy.  Patient will benefit from continued physical therapy in hospital and recommended venue below to increase strength, balance, endurance for safe ADLs and gait.    Follow Up Recommendations  Home health PT;Supervision for mobility/OOB;Supervision - Intermittent     Equipment Recommendations  None recommended by PT    Recommendations for Other Services       Precautions / Restrictions Precautions Precautions: Fall Restrictions Weight Bearing Restrictions: No     Mobility  Bed Mobility               General bed mobility comments: Patient present up in chair  Transfers Overall transfer level: Needs assistance Equipment used: Rolling walker (2 wheeled) Transfers: Sit to/from [employer] Transfers Sit to Stand: Supervision Stand pivot transfers: Supervision       General transfer comment: slightly labored movement  Ambulation/Gait Ambulation/Gait assistance: Min guard Gait Distance (Feet): 30 Feet Assistive device: Rolling walker (2 wheeled) Gait Pattern/deviations: Decreased step length - right;Decreased step length - left;Decreased stride length Gait velocity: decreased   General Gait Details: slow slightly labored cadence without loss of balance, mostly limited due to increasing low back pain and fatigue   Stairs             Wheelchair Mobility    Modified Rankin (Stroke Patients Only)       Balance Overall balance assessment: Needs assistance Sitting-balance support: Feet supported;No upper extremity supported Sitting balance-Leahy Scale: Good     Standing balance support: During functional activity;Bilateral upper extremity supported Standing balance-Leahy Scale: Fair Standing balance comment: using RW                            Cognition Arousal/Alertness: Awake/alert Behavior During Therapy: WFL for tasks assessed/performed Overall Cognitive Status: Within Functional Limits for tasks assessed                                        Exercises General Exercises -  Lower Extremity Long Arc Quad: Seated;AROM;Strengthening;Both;10 reps Hip Flexion/Marching: Seated;AROM;Strengthening;Both;10 reps Toe Raises: Seated;AROM;Strengthening;Both;10 reps Heel Raises: Seated;AROM;Both;10 reps;Strengthening    General Comments        Pertinent Vitals/Pain Pain Assessment: Faces Faces Pain Scale: Hurts little more Pain Location: low back when walking Pain Descriptors /  Indicators: Aching;Sore;Grimacing Pain Intervention(s): Limited activity within patient's tolerance;Monitored during session    Home Living                      Prior Function            PT Goals (current goals can now be found in the care plan section) Acute Rehab PT Goals Patient Stated Goal: return home with family to assist PT Goal Formulation: With patient Time For Goal Achievement: 04/16/18 Potential to Achieve Goals: Good Progress towards PT goals: Progressing toward goals    Frequency    Min 3X/week      PT Plan Current plan remains appropriate    Co-evaluation              AM-PAC PT "6 Clicks" Mobility   Outcome Measure  Help needed turning from your back to your side while in a flat bed without using bedrails?: None Help needed moving from lying on your back to sitting on the side of a flat bed without using bedrails?: None Help needed moving to and from a bed to a chair (including a wheelchair)?: A Little Help needed standing up from a chair using your arms (e.g., wheelchair or bedside chair)?: A Little Help needed to walk in hospital room?: A Little Help needed climbing 3-5 steps with a railing? : A Lot 6 Click Score: 19    End of Session   Activity Tolerance: Patient tolerated treatment well;Patient limited by fatigue;Patient limited by pain(low back pain) Patient left: in chair;with call bell/phone within reach Nurse Communication: Mobility status PT Visit Diagnosis: Unsteadiness on feet (R26.81);Other abnormalities of gait and mobility (R26.89);Muscle weakness (generalized) (M62.81)     Time: 1025-1050 PT Time Calculation (min) (ACUTE ONLY): 25 min  Charges:  $Gait Training: 8-22 mins $Therapeutic Exercise: 8-22 mins                     11:07 AM, 04/10/18 Lonell Grandchild, MPT Physical Therapist with [facility] [phone] office [phone] mobile phone

## 2018-04-10 NOTE — Care Management Important Message (Signed)
Important Message  Patient Details  Name: [name] MRN: [id number] Date of Birth: [birth year]   Medicare Important Message Given:  Yes    Kobi Aller, Chauncey Reading, RN 04/10/2018, 10:57 AM

## 2018-04-10 NOTE — Care Management (Signed)
Patient discharging home. Cassie, [facility] rep updated.

## 2018-04-10 NOTE — Progress Notes (Signed)
Patient is to be discharged home and in stable condition. Patient's IV removed, WNL. Patient and son given discharge instructions and verbalized understanding. Patient to be transported out by staff via wheelchair.  Celestia Khat, RN

## 2018-04-10 NOTE — Discharge Summary (Signed)
Physician Discharge Summary  DIANDRA CIMINI ERD:[id number] DOB: Jun 13, 1933 DOA: 04/06/2018  PCP: [name], MD  Admit date: 04/06/2018  Discharge date: 04/10/2018  Admitted From:Home  Disposition:  Home  Recommendations for Outpatient Follow-up:  1. Follow up with PCP in 1-2 weeks  Home Health: Yes with RN and PT  Equipment/Devices: Has rolling walker along with bedside commode, hospital bed, and wheelchair  Discharge Condition: Stable  CODE STATUS: DNR  Diet recommendation: Heart Healthy/carb modified  Brief/Interim Summary: Per HPI from Dr. Dyann Kief: 84 y.o.femalewith a history of dementia, chronic atrial fibrillation not on anticoagulation due to falls, depression, hyperlipidemia, hypertension, prediabetes, recent admission in October for urosepsis. Patient was recently at [facility] and recently returned home. She went to her PCPs office on Monday due to complaints of dysuria and right-sided chest pain. Urinalysis showed a UTI, however the PCP did not treat her infection due to wanting cultures prior to starting treatment. The patient presents today with cough and left-sided chest pain with deep inspiration. Her son provides the history as the patient has dementia,who states that she has beenmore confused than normal. She has been having Reiger's at home, but no fevers. Her symptoms are worsening. She has had a lack of appetite and has had little urine output, particularly as she has had minimal oral liquids.  She had been admitted with acute, toxic encephalopathy in the setting of ESBL E. coli UTI.  She was placed on IV Merrem and has finished a 5-day course of treatment and has improved significantly in terms of mentation.  She did have some atypical chest pain arise during her hospitalization for which she was evaluated for ACS, but she did not have any signs of this.  She has been set up with home health physical therapy on discharge and will need PT and RN  services.  She has had no other acute events during this hospitalization.   Discharge Diagnoses:  Principal Problem:   Acute encephalopathy Active Problems:   Hyperlipidemia   Essential hypertension   Dementia (HCC)   Urinary tract infection due to extended-spectrum beta lactamase (ESBL) producing Escherichia coli   Chronic a-fib   Atelectasis of left lung  Principal discharge diagnosis: Acute toxic encephalopathy secondary to ESBL E. coli UTI.  Discharge Instructions  Discharge Instructions    Diet - low sodium heart healthy   Complete by:  As directed    Increase activity slowly   Complete by:  As directed      Allergies as of 04/10/2018      Reactions   Sulfonamide Derivatives Hives, Rash      Medication List    STOP taking these medications   nitrofurantoin (macrocrystal-monohydrate) 100 MG capsule Commonly known as:  MACROBID     TAKE these medications   aspirin EC 81 MG tablet Take 81 mg by mouth daily.   atenolol 50 MG tablet Commonly known as:  TENORMIN Take 50 mg by mouth daily.   DULoxetine 30 MG capsule Commonly known as:  CYMBALTA Take 90 mg by mouth daily.   gabapentin 800 MG tablet Commonly known as:  NEURONTIN Take 800-1,600 mg by mouth as directed. 800 mg in the morning, 800 mg at noon, 1600 mg at night   oxymorphone 5 MG tablet Commonly known as:  OPANA Take 1 tablet (5 mg total) by mouth every 8 (eight) hours as needed for pain.   pantoprazole 40 MG tablet Commonly known as:  PROTONIX Take 1 tablet (40 mg total)  by mouth daily. Start taking on:  04/11/2018   rosuvastatin 40 MG tablet Commonly known as:  CRESTOR TAKE 1 TABLET BY MOUTH EVERY DAY What changed:  when to take this   TYLENOL 500 MG tablet Generic drug:  acetaminophen Take 500 mg by mouth every 8 (eight) hours as needed for mild pain.   Vitamin D3 50 MCG (2000 UT) Tabs Take 2,000 Units by mouth daily.      Follow-up Information    [name], MD Follow up  in 2 week(s).   Specialty:  Family Medicine Contact information: [address] [place] [address] [phone]        Lelon Perla, MD .   Specialty:  Cardiology Contact information: [address] [place] [address] [phone]          Allergies  Allergen Reactions  . Sulfonamide Derivatives Hives and Rash    Consultations:  None   Procedures/Studies: Dg Chest 2 View  Result Date: 04/06/2018 CLINICAL DATA:  Intermittent right chest pain. EXAM: CHEST - 2 VIEW COMPARISON:  03/27/2018. FINDINGS: Stable enlarged cardiac silhouette. Interval patchy opacity in the left lower lobe with possible left pleural fluid. Clear right lung. Flattening of the hemidiaphragms and mildly prominent interstitial markings are again demonstrated. Thoracic spine degenerative changes and marked T12 compression deformity without significant change. IMPRESSION: 1. Left lower lobe pneumonia and possible left pleural effusion. 2. Stable cardiomegaly and changes of COPD. Electronically Signed   By: Claudie Revering M.D.   On: 04/06/2018 10:41   Dg Chest 2 View  Result Date: 03/27/2018 CLINICAL DATA:  Altered level of consciousness EXAM: CHEST - 2 VIEW COMPARISON:  07/03/2017 FINDINGS: Cardiac enlargement without heart failure. Atherosclerotic aortic arch in thoracic aorta. Advanced coronary calcification. Lungs are clear without infiltrate effusion or mass. Severe compression fracture T12 is unchanged. IMPRESSION: No active cardiopulmonary disease. Electronically Signed   By: Franchot Gallo M.D.   On: 03/27/2018 13:02   Ct Head Wo Contrast  Result Date: 03/27/2018 CLINICAL DATA:  Altered mental status/level of consciousness. EXAM: CT HEAD WITHOUT CONTRAST TECHNIQUE: Contiguous axial images were obtained from the base of the skull through the vertex without intravenous contrast. COMPARISON:  February 23, 2018 FINDINGS: Brain: Mild diffuse atrophy is stable. There is no  intracranial mass, hemorrhage, extra-axial fluid collection, or midline shift. There is patchy small vessel disease throughout the centra semiovale bilaterally. There is a prior small infarct in the anterior lentiform nucleus on the right. There is small vessel disease in the anterior limb of each external capsule. There is a prior small infarct in medial right upper pons. No acute infarct is demonstrable. Vascular: There is no hyperdense vessel. There is calcification in the distal right vertebral artery as well as in the carotid siphon and right proximal to mid middle cerebral artery regions, stable. There is also mild calcification in the proximal left middle cerebral artery, stable. Skull: The bony calvarium appears intact. There is a small left frontal scalp hematoma. Sinuses/Orbits: Visualized paranasal sinuses are clear. Orbits appear symmetric bilaterally. Other: Mastoid air cells are clear. IMPRESSION: Atrophy with small vessel disease in the periventricular white matter as well as in the right upper pons and basal ganglia regions. No acute infarct evident. No mass or hemorrhage. Multiple foci of arterial vascular calcification noted. Small left frontal scalp hematoma. Electronically Signed   By: Lowella Grip III M.D.   On: 03/27/2018 14:36   Ct Angio Chest Pe W  And/or Wo Contrast  Result Date: 04/06/2018 CLINICAL DATA:  84 year old female with a history of right-sided chest pain EXAM: CT ANGIOGRAPHY CHEST WITH CONTRAST TECHNIQUE: Multidetector CT imaging of the chest was performed using the standard protocol during bolus administration of intravenous contrast. Multiplanar CT image reconstructions and MIPs were obtained to evaluate the vascular anatomy. CONTRAST:  39mL ISOVUE-370 IOPAMIDOL (ISOVUE-370) INJECTION 76% COMPARISON:  02/04/2018 FINDINGS: Cardiovascular: Heart: Redemonstration of cardiomegaly. No pericardial fluid/thickening. Calcifications of left main, left anterior descending,  circumflex, right coronary arteries. Aorta: Diameter of the ascending aorta relatively unchanged 38 mm. Atherosclerotic calcifications of the aortic arch. Calcifications of the branch vessels. Type 3 arch. Atherosclerotic changes of the descending thoracic aorta. Pulmonary arteries: No central, lobar, segmental, or proximal subsegmental filling defects. Mediastinum/Nodes: No mediastinal adenopathy. Unremarkable appearance of the thoracic inlet. Unremarkable thoracic esophagus.  No central airway debris. Lungs/Pleura: Atelectasis at the left lung base associated with pleural effusion. Trace right-sided pleural effusion. Respiratory motion somewhat limits evaluation, however, there's no significant interlobular septal thickening. Mild ground-glass geographic opacities of the lungs. Right lung relatively well aerated. Mild bronchial wall thickening. Upper Abdomen: No acute finding of the upper abdomen. Musculoskeletal: Redemonstration of T12 compression fracture, unchanged. Osteopenia. Degenerative changes of the thoracic spine. Review of the MIP images confirms the above findings. IMPRESSION: CT negative for pulmonary emboli. Left-sided pleural effusion with associated atelectasis. Trace right-sided pleural effusion. Aortic atherosclerosis with associated left main and 3 vessel coronary artery disease. Evidence small airway disease of the bilateral lungs, with no edema or evidence of lobar pneumonia. Aortic Atherosclerosis (ICD10-I70.0). Electronically Signed   By: Corrie Mckusick D.O.   On: 04/06/2018 13:18   Dg Chest Port 1 View  Result Date: 04/08/2018 CLINICAL DATA:  Chest pain. EXAM: PORTABLE CHEST 1 VIEW COMPARISON:  04/06/2018 FINDINGS: Stable cardiomegaly. Increased opacity in both lung bases likely due to atelectasis. Left pleural effusion cannot be excluded. IMPRESSION: Increased bibasilar opacity, likely due to atelectasis. Left pleural effusion cannot be excluded. Stable cardiomegaly. Electronically  Signed   By: Earle Gell M.D.   On: 04/08/2018 08:07     Discharge Exam: Vitals:   04/10/18 0647 04/10/18 0838  BP: 123/84 136/77  Pulse: 86 95  Resp: 16   Temp: 98.5 F (36.9 C)   SpO2: 96%    Vitals:   04/09/18 1047 04/09/18 2147 04/10/18 0647 04/10/18 0838  BP: (!) 155/62 115/71 123/84 136/77  Pulse: 92 81 86 95  Resp:  20 16   Temp:  98.3 F (36.8 C) 98.5 F (36.9 C)   TempSrc:  Oral Oral   SpO2:  96% 96%   Weight:      Height:        General: Pt is alert, awake, not in acute distress Cardiovascular: RRR, S1/S2 +, no rubs, no gallops Respiratory: CTA bilaterally, no wheezing, no rhonchi Abdominal: Soft, NT, ND, bowel sounds + Extremities: no edema, no cyanosis    The results of significant diagnostics from this hospitalization (including imaging, microbiology, ancillary and laboratory) are listed below for reference.     Microbiology: Recent Results (from the past 240 hour(s))  Urine Culture     Status: Abnormal   Collection Time: 04/03/18  9:46 AM  Result Value Ref Range Status   MICRO NUMBER: [id number]  Final   SPECIMEN QUALITY: Adequate  Final   Sample Source URINE, CLEAN CATCH  Final   STATUS: FINAL  Final   ISOLATE 1: Escherichia coli (A)  Final  Comment: 50,000-100,000 CFU/mL of Escherichia coli Escherichia coli (ESBL) ESBL RESULT:        The organism has been confirmed as an ESBL producer.      Susceptibility   Escherichia coli - URINE CULTURE, REFLEX    AMOX/CLAVULANIC 16 Intermediate     AMPICILLIN* >=32 Resistant      * Extended spectrum beta-lactamase (ESBL) producingorganisms demonstrate decreased activity withpenicillins, cephalosporins and aztreonam.    AMPICILLIN/SULBACTAM >=32 Resistant     CEFAZOLIN* >=64 Resistant      * Extended spectrum beta-lactamase (ESBL) producingorganisms demonstrate decreased activity withpenicillins, cephalosporins and aztreonam.For uncomplicated UTI caused by E. coli,K. pneumoniae or P. mirabilis: Cefazolin  issusceptible if MIC <32 mcg/mL and predictssusceptible to the oral agents cefaclor, cefdinir,cefpodoxime, cefprozil, cefuroxime, cephalexinand loracarbef.    CEFEPIME  Resistant     CEFTRIAXONE >=64 Resistant     CIPROFLOXACIN >=4 Resistant     LEVOFLOXACIN >=8 Resistant     ERTAPENEM <=0.5 Sensitive     GENTAMICIN <=1 Sensitive     IMIPENEM <=0.25 Sensitive     NITROFURANTOIN <=16 Sensitive     PIP/TAZO 8 Sensitive     TOBRAMYCIN 8 Intermediate     TRIMETH/SULFA* >=320 Resistant      * Extended spectrum beta-lactamase (ESBL) producingorganisms demonstrate decreased activity withpenicillins, cephalosporins and aztreonam.For uncomplicated UTI caused by E. coli,K. pneumoniae or P. mirabilis: Cefazolin issusceptible if MIC <32 mcg/mL and predictssusceptible to the oral agents cefaclor, cefdinir,cefpodoxime, cefprozil, cefuroxime, cephalexinand loracarbef.Legend:S = Susceptible  I = IntermediateR = Resistant  NS = Not susceptible* = Not tested  NR = Not reported**NN = See antimicrobic comments  Urine Culture     Status: Abnormal   Collection Time: 04/06/18  4:00 PM  Result Value Ref Range Status   Specimen Description   Final    URINE, CATHETERIZED Performed at [facility], [address], [place], [place] [address]    Special Requests   Final    Normal Performed at [facility], [address] [place], [place] [address]    Culture (A)  Final    >=100,000 COLONIES/mL ESCHERICHIA COLI Confirmed Extended Spectrum Beta-Lactamase Producer (ESBL).  In bloodstream infections from ESBL organisms, carbapenems are preferred over piperacillin/tazobactam. They are shown to have a lower risk of mortality.    Report Status 04/09/2018 FINAL  Final   Organism ID, Bacteria ESCHERICHIA COLI (A)  Final      Susceptibility   Escherichia coli - MIC*    AMPICILLIN >=32 RESISTANT Resistant     CEFAZOLIN >=64 RESISTANT Resistant     CEFTRIAXONE >=64 RESISTANT Resistant     CIPROFLOXACIN >=4 RESISTANT  Resistant     GENTAMICIN <=1 SENSITIVE Sensitive     IMIPENEM <=0.25 SENSITIVE Sensitive     NITROFURANTOIN <=16 SENSITIVE Sensitive     TRIMETH/SULFA >=320 RESISTANT Resistant     AMPICILLIN/SULBACTAM >=32 RESISTANT Resistant     PIP/TAZO 64 INTERMEDIATE Intermediate     Extended ESBL POSITIVE Resistant     * >=100,000 COLONIES/mL ESCHERICHIA COLI  MRSA PCR Screening     Status: None   Collection Time: 04/06/18  4:06 PM  Result Value Ref Range Status   MRSA by PCR NEGATIVE NEGATIVE Final    Comment:        The GeneXpert MRSA Assay (FDA approved for NASAL specimens only), is one component of a comprehensive MRSA colonization surveillance program. It is not intended to diagnose MRSA infection nor to guide or monitor treatment for MRSA infections. Performed at  [address] [place], [place] [address]      Labs: BNP (last 3 results) Recent Labs    02/03/18 1952 02/09/18 1651 04/06/18 1000  BNP 357.6* 720.4* 825.0*   Basic Metabolic Panel: Recent Labs  Lab 04/06/18 1000 04/07/18 0640 04/08/18 0222 04/09/18 0538  NA 136 137 134* 138  K 4.6 3.9 4.0 3.8  CL 101 102 102 105  CO2 27 26 24 27   GLUCOSE 109* 97 104* 104*  BUN 18 14 13 12   CREATININE 0.84 0.72 0.71 0.73  CALCIUM 8.5* 8.4* 8.1* 8.3*   Liver Function Tests: No results for input(s): AST, ALT, ALKPHOS, BILITOT, PROT, ALBUMIN in the last 168 hours. No results for input(s): LIPASE, AMYLASE in the last 168 hours. No results for input(s): AMMONIA in the last 168 hours. CBC: Recent Labs  Lab 04/06/18 1000 04/07/18 0640 04/08/18 0222 04/09/18 0538  WBC 10.5 8.3 8.5 7.3  HGB 11.6* 10.8* 10.4* 10.8*  HCT 39.0 35.9* 34.3* 35.2*  MCV 92.2 90.4 88.9 89.1  PLT 362 344 323 355   Cardiac Enzymes: Recent Labs  Lab 04/06/18 1000 04/06/18 1718 04/08/18 0222 04/08/18 0824 04/08/18 1438  TROPONINI <0.03 <0.03 <0.03 <0.03 <0.03   BNP: Invalid input(s): POCBNP CBG: Recent Labs  Lab  04/09/18 0754 04/09/18 1107 04/09/18 1608 04/09/18 2145 04/10/18 0755  GLUCAP 109* 127* 99 91 93   D-Dimer No results for input(s): DDIMER in the last 72 hours. Hgb A1c No results for input(s): HGBA1C in the last 72 hours. Lipid Profile No results for input(s): CHOL, HDL, LDLCALC, TRIG, CHOLHDL, LDLDIRECT in the last 72 hours. Thyroid function studies No results for input(s): TSH, T4TOTAL, T3FREE, THYROIDAB in the last 72 hours.  Invalid input(s): FREET3 Anemia work up No results for input(s): VITAMINB12, FOLATE, FERRITIN, TIBC, IRON, RETICCTPCT in the last 72 hours. Urinalysis    Component Value Date/Time   COLORURINE YELLOW 04/06/2018 1600   APPEARANCEUR HAZY (A) 04/06/2018 1600   LABSPEC 1.039 (H) 04/06/2018 1600   PHURINE 5.0 04/06/2018 1600   GLUCOSEU NEGATIVE 04/06/2018 1600   GLUCOSEU NEG mg/dL 03/08/2007 2031   HGBUR SMALL (A) 04/06/2018 1600   HGBUR negative 03/06/2008 0846   BILIRUBINUR NEGATIVE 04/06/2018 1600   KETONESUR NEGATIVE 04/06/2018 1600   PROTEINUR NEGATIVE 04/06/2018 1600   UROBILINOGEN 0.2 09/20/2013 1535   NITRITE NEGATIVE 04/06/2018 1600   LEUKOCYTESUR LARGE (A) 04/06/2018 1600   Sepsis Labs Invalid input(s): PROCALCITONIN,  WBC,  LACTICIDVEN Microbiology Recent Results (from the past 240 hour(s))  Urine Culture     Status: Abnormal   Collection Time: 04/03/18  9:46 AM  Result Value Ref Range Status   MICRO NUMBER: [id number]  Final   SPECIMEN QUALITY: Adequate  Final   Sample Source URINE, CLEAN CATCH  Final   STATUS: FINAL  Final   ISOLATE 1: Escherichia coli (A)  Final    Comment: 50,000-100,000 CFU/mL of Escherichia coli Escherichia coli (ESBL) ESBL RESULT:        The organism has been confirmed as an ESBL producer.      Susceptibility   Escherichia coli - URINE CULTURE, REFLEX    AMOX/CLAVULANIC 16 Intermediate     AMPICILLIN* >=32 Resistant      * Extended spectrum beta-lactamase (ESBL) producingorganisms demonstrate decreased  activity withpenicillins, cephalosporins and aztreonam.    AMPICILLIN/SULBACTAM >=32 Resistant     CEFAZOLIN* >=64 Resistant      * Extended spectrum beta-lactamase (ESBL) producingorganisms demonstrate decreased activity withpenicillins, cephalosporins and  aztreonam.For uncomplicated UTI caused by E. coli,K. pneumoniae or P. mirabilis: Cefazolin issusceptible if MIC <32 mcg/mL and predictssusceptible to the oral agents cefaclor, cefdinir,cefpodoxime, cefprozil, cefuroxime, cephalexinand loracarbef.    CEFEPIME  Resistant     CEFTRIAXONE >=64 Resistant     CIPROFLOXACIN >=4 Resistant     LEVOFLOXACIN >=8 Resistant     ERTAPENEM <=0.5 Sensitive     GENTAMICIN <=1 Sensitive     IMIPENEM <=0.25 Sensitive     NITROFURANTOIN <=16 Sensitive     PIP/TAZO 8 Sensitive     TOBRAMYCIN 8 Intermediate     TRIMETH/SULFA* >=320 Resistant      * Extended spectrum beta-lactamase (ESBL) producingorganisms demonstrate decreased activity withpenicillins, cephalosporins and aztreonam.For uncomplicated UTI caused by E. coli,K. pneumoniae or P. mirabilis: Cefazolin issusceptible if MIC <32 mcg/mL and predictssusceptible to the oral agents cefaclor, cefdinir,cefpodoxime, cefprozil, cefuroxime, cephalexinand loracarbef.Legend:S = Susceptible  I = IntermediateR = Resistant  NS = Not susceptible* = Not tested  NR = Not reported**NN = See antimicrobic comments  Urine Culture     Status: Abnormal   Collection Time: 04/06/18  4:00 PM  Result Value Ref Range Status   Specimen Description   Final    URINE, CATHETERIZED Performed at [facility], [address] [place], [place] [address]    Special Requests   Final    Normal Performed at [facility], [address] [place], [place] [address]    Culture (A)  Final    >=100,000 COLONIES/mL ESCHERICHIA COLI Confirmed Extended Spectrum Beta-Lactamase Producer (ESBL).  In bloodstream infections from ESBL organisms, carbapenems are preferred over piperacillin/tazobactam.  They are shown to have a lower risk of mortality.    Report Status 04/09/2018 FINAL  Final   Organism ID, Bacteria ESCHERICHIA COLI (A)  Final      Susceptibility   Escherichia coli - MIC*    AMPICILLIN >=32 RESISTANT Resistant     CEFAZOLIN >=64 RESISTANT Resistant     CEFTRIAXONE >=64 RESISTANT Resistant     CIPROFLOXACIN >=4 RESISTANT Resistant     GENTAMICIN <=1 SENSITIVE Sensitive     IMIPENEM <=0.25 SENSITIVE Sensitive     NITROFURANTOIN <=16 SENSITIVE Sensitive     TRIMETH/SULFA >=320 RESISTANT Resistant     AMPICILLIN/SULBACTAM >=32 RESISTANT Resistant     PIP/TAZO 64 INTERMEDIATE Intermediate     Extended ESBL POSITIVE Resistant     * >=100,000 COLONIES/mL ESCHERICHIA COLI  MRSA PCR Screening     Status: None   Collection Time: 04/06/18  4:06 PM  Result Value Ref Range Status   MRSA by PCR NEGATIVE NEGATIVE Final    Comment:        The GeneXpert MRSA Assay (FDA approved for NASAL specimens only), is one component of a comprehensive MRSA colonization surveillance program. It is not intended to diagnose MRSA infection nor to guide or monitor treatment for MRSA infections. Performed at [facility], [address] [place], [place] [address]      Time coordinating discharge: 35 minutes  SIGNED:   Rodena Goldmann, DO [facility] 04/10/2018, 10:31 AM Pager [phone]  If 7PM-7AM, please contact night-coverage [email or web address] Password [email or web address]

## 2018-04-11 DIAGNOSIS — I10 Essential (primary) hypertension: Secondary | ICD-10-CM | POA: Diagnosis not present

## 2018-04-11 DIAGNOSIS — M1712 Unilateral primary osteoarthritis, left knee: Secondary | ICD-10-CM | POA: Diagnosis not present

## 2018-04-11 DIAGNOSIS — F0391 Unspecified dementia with behavioral disturbance: Secondary | ICD-10-CM | POA: Diagnosis not present

## 2018-04-11 DIAGNOSIS — G9009 Other idiopathic peripheral autonomic neuropathy: Secondary | ICD-10-CM | POA: Diagnosis not present

## 2018-04-11 DIAGNOSIS — M8008XD Age-related osteoporosis with current pathological fracture, vertebra(e), subsequent encounter for fracture with routine healing: Secondary | ICD-10-CM | POA: Diagnosis not present

## 2018-04-11 DIAGNOSIS — I482 Chronic atrial fibrillation, unspecified: Secondary | ICD-10-CM | POA: Diagnosis not present

## 2018-04-12 ENCOUNTER — Ambulatory Visit: Payer: Medicare Other | Admitting: Licensed Clinical Social Worker

## 2018-04-12 DIAGNOSIS — M1712 Unilateral primary osteoarthritis, left knee: Secondary | ICD-10-CM | POA: Diagnosis not present

## 2018-04-12 DIAGNOSIS — M8008XD Age-related osteoporosis with current pathological fracture, vertebra(e), subsequent encounter for fracture with routine healing: Secondary | ICD-10-CM | POA: Diagnosis not present

## 2018-04-12 DIAGNOSIS — F0391 Unspecified dementia with behavioral disturbance: Secondary | ICD-10-CM | POA: Diagnosis not present

## 2018-04-12 DIAGNOSIS — I482 Chronic atrial fibrillation, unspecified: Secondary | ICD-10-CM | POA: Diagnosis not present

## 2018-04-12 DIAGNOSIS — G9009 Other idiopathic peripheral autonomic neuropathy: Secondary | ICD-10-CM | POA: Diagnosis not present

## 2018-04-12 DIAGNOSIS — I10 Essential (primary) hypertension: Secondary | ICD-10-CM | POA: Diagnosis not present

## 2018-04-13 ENCOUNTER — Other Ambulatory Visit: Payer: Self-pay

## 2018-04-13 NOTE — Patient Outreach (Signed)
[facility]) Care Management  04/13/2018  [name] Sep 19, 1933 [id number]     EMMI- General discharge  RED ON EMMI ALERT Day # 1 Date: 04/12/18 10:33 AM Red Alert Reason: "Scheduled follow-up? No"   Outreach attempt # 1 successful. Spoke with patient's son [name], listed as approved person to speak with. Reviewed and addressed red alert. Mr. Carter Kitten states he will be calling the patient's PCP office today at the end of our call to schedule her post D/C f/u visit. He will also ask the PCP to contact her Pain Clinic due to patient had a missed appt while IP and the family forgot to call the clinic, reports patient has 2 pain tablets remaining. Son reports she has all medications in the home and is taking her medications exactly as prescribed without missed doses, he denies questions or need of assistance. He reports, "she is doing great". Son states the patient lives with him and his 2 brothers, his sister lives near by, all share responsibility with caring for her as needed. He is providing transportation, but may need assistance towards the end of January d/t being scheduled for ankle surgery he will not be driving, and his sister may not be available. Son also needs resources for a W/C ramp. RN CM offered [facility] SW referral, he agreed and is appreciative. RN CM reviewed s/s of UTI and when to call the doctor, advised to report symptoms as soon as possible for early treatment. RN CM educated son on tips to help avoid UTI, including keeping patient well hydrated, teaching patient to alert them to the color of her urine but should be clear yellow, instructed on cranberry juice may be beneficial but isn't proven, instructed son to educate patient on importance of good perineum hygiene such as wiping/cleaning from front to back and keeping area clean and dry, discouraged bathing in a tub of soapy water. Son verbalizes understanding and is appreciative of the information. RN CM  provided son with [facility] 24 hour nurse advise line phone number. Explained the [facility] SW will call him regarding resources.    Plan: RN CM will close case due to patient has all services needed in place and no further CM needs are identified at this time. RN CM will make a [facility] SW referral for resources for a W/C ramp and assistance with transportation. RN CM will mail patient/son EMMI ed materials for UTI prevention.   Barb Merino, RN,CCM [facility] Care Management Care Management Coordinator Direct Phone: [phone] Toll Free: [phone] Fax: [phone]

## 2018-04-16 DIAGNOSIS — M8008XD Age-related osteoporosis with current pathological fracture, vertebra(e), subsequent encounter for fracture with routine healing: Secondary | ICD-10-CM | POA: Diagnosis not present

## 2018-04-16 DIAGNOSIS — I482 Chronic atrial fibrillation, unspecified: Secondary | ICD-10-CM | POA: Diagnosis not present

## 2018-04-16 DIAGNOSIS — G9009 Other idiopathic peripheral autonomic neuropathy: Secondary | ICD-10-CM | POA: Diagnosis not present

## 2018-04-16 DIAGNOSIS — I10 Essential (primary) hypertension: Secondary | ICD-10-CM | POA: Diagnosis not present

## 2018-04-16 DIAGNOSIS — M1712 Unilateral primary osteoarthritis, left knee: Secondary | ICD-10-CM | POA: Diagnosis not present

## 2018-04-16 DIAGNOSIS — F0391 Unspecified dementia with behavioral disturbance: Secondary | ICD-10-CM | POA: Diagnosis not present

## 2018-04-17 ENCOUNTER — Telehealth: Payer: Self-pay | Admitting: Family Medicine

## 2018-04-17 DIAGNOSIS — I6381 Other cerebral infarction due to occlusion or stenosis of small artery: Secondary | ICD-10-CM

## 2018-04-17 NOTE — Telephone Encounter (Signed)
pts son called stating they are having a hard time getting her pain doctor to refill her pain meds because she missed an appointment with them because she was inp at the hospital they do not believe her they are requesting that you or dr p call her pain doctor and let them know that she needs her pain meds

## 2018-04-18 ENCOUNTER — Other Ambulatory Visit: Payer: Self-pay

## 2018-04-18 DIAGNOSIS — I482 Chronic atrial fibrillation, unspecified: Secondary | ICD-10-CM | POA: Diagnosis not present

## 2018-04-18 DIAGNOSIS — M1712 Unilateral primary osteoarthritis, left knee: Secondary | ICD-10-CM | POA: Diagnosis not present

## 2018-04-18 DIAGNOSIS — I10 Essential (primary) hypertension: Secondary | ICD-10-CM | POA: Diagnosis not present

## 2018-04-18 DIAGNOSIS — F0391 Unspecified dementia with behavioral disturbance: Secondary | ICD-10-CM | POA: Diagnosis not present

## 2018-04-18 DIAGNOSIS — G9009 Other idiopathic peripheral autonomic neuropathy: Secondary | ICD-10-CM | POA: Diagnosis not present

## 2018-04-18 DIAGNOSIS — M8008XD Age-related osteoporosis with current pathological fracture, vertebra(e), subsequent encounter for fracture with routine healing: Secondary | ICD-10-CM | POA: Diagnosis not present

## 2018-04-18 NOTE — Telephone Encounter (Signed)
Spoke to [employer], pt's son, and he states that she sees Dr. Brien Few for her pain medication and she missed an apt while in the hosp in [place] and they will not see her until they pay a $50 no show fee nor will they refill her medication. He states that she will go see a different pain doctor but she needs some pain med as she has bee out for a while and she is in pain. Please advise what we can do to help her.

## 2018-04-18 NOTE — Patient Outreach (Signed)
[facility]) Care Management  04/18/2018  [name] 08-21-1933 [id number]   Initial outreach to patient's son, [name], regarding social work referral for transportation and ramp resources.   Per Mr. [name], he or his sister typically take patient to her MD appointments.  She just relocated to [facility] and all of her providers are in [place].  Mr. [name] has an upcoming surgery at the end of the month and is seeking additional transportation options in case his sister is not available. BSW informed him about [facility] but this service is only available within [facility].  BSW inquired about whether or not patient has full Medicaid and informed him that she is likely eligible for transportation services if she does.  BSW encouraged him to contact her caseworker at [facility] to inquire about this.  BSW informed him that they may have to consider a change in providers if she intends to stay in [facility] and transportation is a barrier to appointments in [place]. BSW educated Mr. [name] about home modification services with [facility] and [facility].  BSW needs to seek clarification about whether or not patient has to own her home to be eligible for services through either agency.  BSW will contact agencies and follow up with him.  Mr. [name] said that the landlord has approved a ramp being built at the home.  BSW will follow up with Mr. [name] once this has been clarified and submit referrals if able.   [name], BSW Social Worker [phone]

## 2018-04-19 DIAGNOSIS — I482 Chronic atrial fibrillation, unspecified: Secondary | ICD-10-CM | POA: Diagnosis not present

## 2018-04-19 DIAGNOSIS — G9009 Other idiopathic peripheral autonomic neuropathy: Secondary | ICD-10-CM | POA: Diagnosis not present

## 2018-04-19 DIAGNOSIS — M8008XD Age-related osteoporosis with current pathological fracture, vertebra(e), subsequent encounter for fracture with routine healing: Secondary | ICD-10-CM | POA: Diagnosis not present

## 2018-04-19 DIAGNOSIS — I10 Essential (primary) hypertension: Secondary | ICD-10-CM | POA: Diagnosis not present

## 2018-04-19 DIAGNOSIS — M1712 Unilateral primary osteoarthritis, left knee: Secondary | ICD-10-CM | POA: Diagnosis not present

## 2018-04-19 DIAGNOSIS — F0391 Unspecified dementia with behavioral disturbance: Secondary | ICD-10-CM | POA: Diagnosis not present

## 2018-04-19 NOTE — Telephone Encounter (Signed)
I am ok with one time refill until she can be seen by another pain clinic.  Although if I were them, I would call the pain clinic and ask to speak to the office manager and explain the situation (she was admitted with pneumonia).  Surely they can understand that.

## 2018-04-20 ENCOUNTER — Other Ambulatory Visit: Payer: Self-pay

## 2018-04-20 NOTE — Patient Outreach (Addendum)
[facility]) Care Management  04/20/2018  [name] January 11, 1934 [id number]   BSW received communication from [facility] and [facility] that patient does not have to own her home to receive assistance with ramp construction.   BSW called patient's son, [name], to inform him of this.  BSW and Mr. [name] conducted three way call with [facility] to submit referral.  He will receive paperwork in the mail to be completed and mailed back to the ministry.  Once received, they will begin searching for volunteers in the area to assist with material purchase and ramp construction. BSW submitted referral to [facility].  BSW informed Mr. [name] that it will likely be a couple of weeks before he is contacted by them due to processing other referrals and the upcoming holiday. BSW left voicemail message for [name] at [place], [facility] [employer] to determine if they have current funding for this type of assistance.  BSW will follow up with Mr. [name] when return call is received.   Addendum: BSW received return call from [name] at [facility].  Submitted referral information for ramp assistance.  [name] will contact Mr. [name] after the upcoming holiday to schedule the home assessment.  BSW called Mr. [name] to inform him of this.  BSW will follow up within the next three weeks.     Ronn Melena, BSW Social Worker [phone]

## 2018-04-23 ENCOUNTER — Telehealth: Payer: Self-pay | Admitting: *Deleted

## 2018-04-23 DIAGNOSIS — I482 Chronic atrial fibrillation, unspecified: Secondary | ICD-10-CM | POA: Diagnosis not present

## 2018-04-23 DIAGNOSIS — F0391 Unspecified dementia with behavioral disturbance: Secondary | ICD-10-CM | POA: Diagnosis not present

## 2018-04-23 DIAGNOSIS — M1712 Unilateral primary osteoarthritis, left knee: Secondary | ICD-10-CM | POA: Diagnosis not present

## 2018-04-23 DIAGNOSIS — I10 Essential (primary) hypertension: Secondary | ICD-10-CM | POA: Diagnosis not present

## 2018-04-23 DIAGNOSIS — M8008XD Age-related osteoporosis with current pathological fracture, vertebra(e), subsequent encounter for fracture with routine healing: Secondary | ICD-10-CM | POA: Diagnosis not present

## 2018-04-23 DIAGNOSIS — G9009 Other idiopathic peripheral autonomic neuropathy: Secondary | ICD-10-CM | POA: Diagnosis not present

## 2018-04-23 NOTE — Telephone Encounter (Signed)
Please refill.

## 2018-04-23 NOTE — Telephone Encounter (Addendum)
[facility] request refill for the Gabapentin, fax orders to [phone] or call.

## 2018-04-26 ENCOUNTER — Encounter: Payer: Self-pay | Admitting: Family Medicine

## 2018-04-26 ENCOUNTER — Ambulatory Visit (INDEPENDENT_AMBULATORY_CARE_PROVIDER_SITE_OTHER): Payer: Medicare Other | Admitting: Family Medicine

## 2018-04-26 ENCOUNTER — Other Ambulatory Visit: Payer: Self-pay

## 2018-04-26 ENCOUNTER — Other Ambulatory Visit: Payer: Self-pay | Admitting: *Deleted

## 2018-04-26 VITALS — BP 130/62 | HR 90 | Temp 98.8°F | Resp 16

## 2018-04-26 DIAGNOSIS — Z09 Encounter for follow-up examination after completed treatment for conditions other than malignant neoplasm: Secondary | ICD-10-CM | POA: Diagnosis not present

## 2018-04-26 DIAGNOSIS — I6381 Other cerebral infarction due to occlusion or stenosis of small artery: Secondary | ICD-10-CM

## 2018-04-26 DIAGNOSIS — I10 Essential (primary) hypertension: Secondary | ICD-10-CM | POA: Diagnosis not present

## 2018-04-26 DIAGNOSIS — R300 Dysuria: Secondary | ICD-10-CM | POA: Diagnosis not present

## 2018-04-26 DIAGNOSIS — R809 Proteinuria, unspecified: Secondary | ICD-10-CM | POA: Diagnosis not present

## 2018-04-26 LAB — COMPLETE METABOLIC PANEL WITH GFR
AG Ratio: 1.2 (calc) (ref 1.0–2.5)
ALT: 11 U/L (ref 6–29)
AST: 15 U/L (ref 10–35)
Albumin: 3.4 g/dL — ABNORMAL LOW (ref 3.6–5.1)
Alkaline phosphatase (APISO): 68 U/L (ref 33–130)
BUN: 19 mg/dL (ref 7–25)
CO2: 27 mmol/L (ref 20–32)
Calcium: 8.8 mg/dL (ref 8.6–10.4)
Chloride: 102 mmol/L (ref 98–110)
Creat: 0.77 mg/dL (ref 0.60–0.88)
GFR, Est African American: 83 mL/min/{1.73_m2} (ref >=60)
GFR, Est Non African American: 72 mL/min/{1.73_m2} (ref >=60)
Globulin: 2.9 g/dL (calc) (ref 1.9–3.7)
Glucose, Bld: 123 mg/dL — ABNORMAL HIGH (ref 65–99)
Potassium: 4.2 mmol/L (ref 3.5–5.3)
Sodium: 139 mmol/L (ref 135–146)
Total Bilirubin: 0.6 mg/dL (ref 0.2–1.2)
Total Protein: 6.3 g/dL (ref 6.1–8.1)

## 2018-04-26 LAB — CBC WITH DIFFERENTIAL/PLATELET
Absolute Monocytes: 512 cells/uL (ref 200–950)
Basophils Absolute: 101 cells/uL (ref 0–200)
Basophils Relative: 1.2 %
Eosinophils Absolute: 252 cells/uL (ref 15–500)
Eosinophils Relative: 3 %
HCT: 39.3 % (ref 35.0–45.0)
Hemoglobin: 12.7 g/dL (ref 11.7–15.5)
Lymphs Abs: 3175 cells/uL (ref 850–3900)
MCH: 28 pg (ref 27.0–33.0)
MCHC: 32.3 g/dL (ref 32.0–36.0)
MCV: 86.6 fL (ref 80.0–100.0)
MPV: 10.7 fL (ref 7.5–12.5)
Monocytes Relative: 6.1 %
Neutro Abs: 4360 cells/uL (ref 1500–7800)
Neutrophils Relative %: 51.9 %
Platelets: 234 10*3/uL (ref 140–400)
RBC: 4.54 10*6/uL (ref 3.80–5.10)
RDW: 14.6 % (ref 11.0–15.0)
Total Lymphocyte: 37.8 %
WBC: 8.4 10*3/uL (ref 3.8–10.8)

## 2018-04-26 MED ORDER — OXYCODONE-ACETAMINOPHEN 7.5-325 MG PO TABS: 1.0000 | ORAL_TABLET | Freq: Three times a day (TID) | ORAL | 0 refills | Status: DC | PRN

## 2018-04-26 MED ORDER — ATENOLOL 50 MG PO TABS: 50.0000 mg | ORAL_TABLET | Freq: Every day | ORAL | 3 refills | Status: DC

## 2018-04-26 MED ORDER — ROSUVASTATIN CALCIUM 40 MG PO TABS: 40.0000 mg | ORAL_TABLET | Freq: Every evening | ORAL | 3 refills | Status: DC

## 2018-04-26 MED ORDER — PANTOPRAZOLE SODIUM 40 MG PO TBEC: 40.0000 mg | DELAYED_RELEASE_TABLET | Freq: Every day | ORAL | 3 refills | Status: DC

## 2018-04-26 NOTE — Telephone Encounter (Signed)
Patient seen in office and prescription sent to pharmacy.

## 2018-04-26 NOTE — Progress Notes (Signed)
Subjective:    Patient ID: [name], female    DOB: 1933-08-02, 84 y.o.   MRN: [id number]  HPI Patient was admitted to the hospital 12/6-12/10 after having encephalopathy due to ESBL UTI.  I have copied relevant portions of the DC summary below for reference:  "She had been admitted with acute, toxic encephalopathy in the setting of ESBL E. coli UTI.  She was placed on IV Merrem and has finished a 5-day course of treatment and has improved significantly in terms of mentation.  She did have some atypical chest pain arise during her hospitalization for which she was evaluated for ACS, but she did not have any signs of this.  She has been set up with home health physical therapy on discharge and will need PT and RN services.  She has had no other acute events during this hospitalization.  Discharge Diagnoses:  Principal Problem:   Acute encephalopathy Active Problems:   Hyperlipidemia   Essential hypertension   Dementia (HCC)   Urinary tract infection due to extended-spectrum beta lactamase (ESBL) producing Escherichia coli   Chronic a-fib   Atelectasis of left lung  Principal discharge diagnosis: Acute toxic encephalopathy secondary to ESBL E. coli UTI."  04/26/18 Here for follow up. Had CT chest in hospital that showed-  CT negative for pulmonary emboli.  Left-sided pleural effusion with associated atelectasis. Trace right-sided pleural effusion.  Aortic atherosclerosis with associated left main and 3 vessel coronary artery disease.  Evidence small airway disease of the bilateral lungs, with no edema or evidence of lobar pneumonia.  Patient is here today with her son for follow-up.  Her pain clinic has discharged her.  Previously she was taking up onto according to the son 5 mg 3 times a day.  However she missed her last appointment when she was admitted to the ICU.  They did not call to cancel the appointment and therefore she has been dismissed according to the son.   She has run out of her upon a and she is now complaining of severe back pain.  However the patient is more awake and alert than I have seen in quite some time.  I had a long discussion with her son regarding the pros and cons of treating her back pain.  I have recommended that we use short acting opiates sparingly to manage her back pain in an effort to try to keep her more awake and alert.  Therefore I would recommend Percocet 7.5/325 1 p.o. every 8 hours as needed.  The son will administer the medication and only give her the pills if she is requesting them and in significant pain.  I have encouraged him to try to use them as sparingly as possible.  He is in agreement with this.  Physical therapy is coming to the house to work with him however she is still having a difficult time walking.  She is only walking 1 or 2 days a week with assistance from the physical therapist.  She lives at home with 4 of her children.  I have encouraged him to schedule a routine every day for 10 or 15 minutes of working with her in trying to help walk and build up her stamina and strength in her legs.  Today on exam, she has right basilar crackles that appear to be atelectasis.  She denies any cough, shortness of breath, fever, chest pain.  She denies any nausea or vomiting.  She denies any dysuria or hematuria.  She denies any diarrhea.  She denies any leg swelling or evidence of a DVT  Past Medical History:  Diagnosis Date  . [name] (abdominal aortic aneurysm) ([place]) 09/2013   3.6 cm  . Acute and chronic respiratory failure with hypoxia ([place])   . Acute delirium   . Acute encephalopathy   . Acute pyelonephritis   . Atrial fibrillation ([place])   . Chronic pain syndrome   . Constipation   . Dementia ([place])   . Dependent edema    bilateral legs   . Depression   . Diabetes mellitus    pre-diabetes  . Diverticulosis   . Diverticulosis   . Dysuria   . E coli bacteremia   . Fall   . Gross hematuria   . Hydronephrosis with  renal and ureteral calculous obstruction   . Hyperlipidemia   . Hypertension   . Hypoxemia   . Idiopathic peripheral autonomic neuropathy   . Infestation by bed bug   . Nephrolithiasis   . OSA on CPAP   . Osteoarthritis   . Osteopenia    T=  -2.1 in hip  . Persistent mood (affective) disorder, unspecified ([place])   . Prediabetes   . Rheumatoid arthritis ([place])   . Rheumatoid arthritis(714.0)   . S/P carpal tunnel release   . Sepsis secondary to UTI ([place])   . Spinal stenosis   . Unspecified fracture of fifth lumbar vertebra, initial encounter for closed fracture ([place])   . Unspecified fracture of t11-T12 vertebra, initial encounter for closed fracture ([place])   . Urinary incontinence   . Urinary tract infection    hx of   . Urolithiasis    Past Surgical History:  Procedure Laterality Date  . APPENDECTOMY    . CARPAL TUNNEL RELEASE  12/27/2006   Right subcutaneous ulnar nerve transfer -- Right open carpal tunnel release  . CHOLECYSTECTOMY    . CYSTOSCOPY W/ URETERAL STENT PLACEMENT Right 01/30/2018   Procedure: CYSTOSCOPY WITH STENT REPLACEMENT retrograde pylegram;  Surgeon: Ardis Hughs, MD;  Location: [facility];  Service: Urology;  Laterality: Right;  . CYSTOSCOPY WITH URETEROSCOPY AND STENT PLACEMENT Right 03/01/2018   Procedure: CYSTOSCPY/RIGHT URETEROSCOPY HOLMIUM LASER LITHOTRIPSY AND STENT EXCHANGE;  Surgeon: Ardis Hughs, MD;  Location: [facility];  Service: Urology;  Laterality: Right;  . FOOT SURGERY    . HOLMIUM LASER APPLICATION Right 09/60/4540   Procedure: HOLMIUM LASER APPLICATION;  Surgeon: Ardis Hughs, MD;  Location: [facility];  Service: Urology;  Laterality: Right;  . JOINT REPLACEMENT     BTKR, RTHR  . TOTAL KNEE ARTHROPLASTY  08/21/2007   Right total knee replacement   Current Outpatient Medications on File Prior to Visit  Medication Sig Dispense Refill  . acetaminophen (TYLENOL) 500 MG tablet Take 500 mg by mouth every 8 (eight) hours as needed for  mild pain.     Marland Kitchen aspirin EC 81 MG tablet Take 81 mg by mouth daily.    . Cholecalciferol (VITAMIN D3) 2000 units TABS Take 2,000 Units by mouth daily.    . DULoxetine (CYMBALTA) 30 MG capsule Take 90 mg by mouth daily.   2  . gabapentin (NEURONTIN) 800 MG tablet Take 800-1,600 mg by mouth as directed. 800 mg in the morning, 800 mg at noon, 1600 mg at night    . oxymorphone (OPANA) 5 MG tablet Take 1 tablet (5 mg total) by mouth every 8 (eight) hours as needed for pain. 5 tablet 0   No current facility-administered  medications on file prior to visit.    Allergies  Allergen Reactions  . Sulfonamide Derivatives Hives and Rash   Social History   Socioeconomic History  . Marital status: Divorced    Spouse name: Not on file  . Number of children: 6  . Years of education: Not on file  . Highest education level: Not on file  Occupational History  . Not on file  Social Needs  . Financial resource strain: Not on file  . Food insecurity:    Worry: Not on file    Inability: Not on file  . Transportation needs:    Medical: Not on file    Non-medical: Not on file  Tobacco Use  . Smoking status: Never Smoker  . Smokeless tobacco: Never Used  Substance and Sexual Activity  . Alcohol use: No  . Drug use: No  . Sexual activity: Not on file  Lifestyle  . Physical activity:    Days per week: Not on file    Minutes per session: Not on file  . Stress: Not on file  Relationships  . Social connections:    Talks on phone: Not on file    Gets together: Not on file    Attends religious service: Not on file    Active member of club or organization: Not on file    Attends meetings of clubs or organizations: Not on file    Relationship status: Not on file  . Intimate partner violence:    Fear of current or ex partner: Not on file    Emotionally abused: Not on file    Physically abused: Not on file    Forced sexual activity: Not on file  Other Topics Concern  . Not on file  Social History  Narrative  . Not on file    Review of Systems  All other systems reviewed and are negative.      Objective:   Physical Exam Vitals signs reviewed.  Constitutional:      General: She is not in acute distress.    Appearance: She is well-developed. She is not diaphoretic.  Cardiovascular:     Rate and Rhythm: Normal rate.     Heart sounds: No murmur. No friction rub. No gallop.   Pulmonary:     Effort: Pulmonary effort is normal. No respiratory distress.     Breath sounds: No stridor or decreased air movement. Examination of the right-lower field reveals rales. Rales present. No decreased breath sounds, wheezing or rhonchi.  Chest:     Chest wall: No tenderness.  Abdominal:     General: Bowel sounds are normal. There is no distension.     Palpations: Abdomen is soft. There is no mass.     Tenderness: There is no abdominal tenderness. There is no guarding or rebound.     Hernia: No hernia is present.  Musculoskeletal:        General: Tenderness present.           Assessment & Plan:  Hospital discharge follow-up - Plan: oxyCODONE-acetaminophen (PERCOCET) 7.5-325 MG tablet, Urinalysis, Routine w reflex microscopic, CBC with Differential/Platelet, COMPLETE METABOLIC PANEL WITH GFR, Urine Culture  I have recommended that they work with her on a daily basis in addition to physical therapy to help build up her strength and stamina in her legs.  I explained to the family and the patient that her risk of urinary tract infections will diminish with increased mobility.  I also recommended discontinuation of upon a.  Instead we will use short acting opiates as sparingly as possible to try to manage her pain.  Her son will control the medication.  I will give her 1 pill every 8 hours as needed however he will use this medication sparingly in an effort to keep her more awake and alert and more aware of discomfort such as dysuria prior to developing sepsis from an infection.  I have encouraged  incentive spirometry to help with atelectasis.  I will repeat a urine culture as well as a CBC and a CMP

## 2018-04-27 ENCOUNTER — Other Ambulatory Visit: Payer: Self-pay | Admitting: Family Medicine

## 2018-04-27 DIAGNOSIS — G9009 Other idiopathic peripheral autonomic neuropathy: Secondary | ICD-10-CM | POA: Diagnosis not present

## 2018-04-27 DIAGNOSIS — F0391 Unspecified dementia with behavioral disturbance: Secondary | ICD-10-CM | POA: Diagnosis not present

## 2018-04-27 DIAGNOSIS — I482 Chronic atrial fibrillation, unspecified: Secondary | ICD-10-CM | POA: Diagnosis not present

## 2018-04-27 DIAGNOSIS — I10 Essential (primary) hypertension: Secondary | ICD-10-CM | POA: Diagnosis not present

## 2018-04-27 DIAGNOSIS — M8008XD Age-related osteoporosis with current pathological fracture, vertebra(e), subsequent encounter for fracture with routine healing: Secondary | ICD-10-CM | POA: Diagnosis not present

## 2018-04-27 DIAGNOSIS — M1712 Unilateral primary osteoarthritis, left knee: Secondary | ICD-10-CM | POA: Diagnosis not present

## 2018-04-27 LAB — URINE CULTURE
MICRO NUMBER:: 91541139
SPECIMEN QUALITY:: ADEQUATE

## 2018-04-27 LAB — URINALYSIS, ROUTINE W REFLEX MICROSCOPIC
Bacteria, UA: NONE SEEN /HPF
Bilirubin Urine: NEGATIVE
Glucose, UA: NEGATIVE
Hgb urine dipstick: NEGATIVE
Hyaline Cast: NONE SEEN /LPF
Ketones, ur: NEGATIVE
Nitrite: NEGATIVE
Specific Gravity, Urine: 1.021 (ref 1.001–1.03)
pH: <=5 (ref 5.0–8.0)

## 2018-04-30 DIAGNOSIS — G9009 Other idiopathic peripheral autonomic neuropathy: Secondary | ICD-10-CM | POA: Diagnosis not present

## 2018-04-30 DIAGNOSIS — I482 Chronic atrial fibrillation, unspecified: Secondary | ICD-10-CM | POA: Diagnosis not present

## 2018-04-30 DIAGNOSIS — I10 Essential (primary) hypertension: Secondary | ICD-10-CM | POA: Diagnosis not present

## 2018-04-30 DIAGNOSIS — M8008XD Age-related osteoporosis with current pathological fracture, vertebra(e), subsequent encounter for fracture with routine healing: Secondary | ICD-10-CM | POA: Diagnosis not present

## 2018-04-30 DIAGNOSIS — F0391 Unspecified dementia with behavioral disturbance: Secondary | ICD-10-CM | POA: Diagnosis not present

## 2018-04-30 DIAGNOSIS — M1712 Unilateral primary osteoarthritis, left knee: Secondary | ICD-10-CM | POA: Diagnosis not present

## 2018-05-03 DIAGNOSIS — F0391 Unspecified dementia with behavioral disturbance: Secondary | ICD-10-CM | POA: Diagnosis not present

## 2018-05-03 DIAGNOSIS — G9009 Other idiopathic peripheral autonomic neuropathy: Secondary | ICD-10-CM | POA: Diagnosis not present

## 2018-05-03 DIAGNOSIS — M1712 Unilateral primary osteoarthritis, left knee: Secondary | ICD-10-CM | POA: Diagnosis not present

## 2018-05-03 DIAGNOSIS — I482 Chronic atrial fibrillation, unspecified: Secondary | ICD-10-CM | POA: Diagnosis not present

## 2018-05-03 DIAGNOSIS — I10 Essential (primary) hypertension: Secondary | ICD-10-CM | POA: Diagnosis not present

## 2018-05-03 DIAGNOSIS — M8008XD Age-related osteoporosis with current pathological fracture, vertebra(e), subsequent encounter for fracture with routine healing: Secondary | ICD-10-CM | POA: Diagnosis not present

## 2018-05-04 ENCOUNTER — Other Ambulatory Visit: Payer: Self-pay

## 2018-05-04 NOTE — Patient Outreach (Signed)
[name]  05/04/2018  [name] 1933-05-17 [id number]   BSW received communication from Johnn Hai at [facility] regarding referral for assistance with ramp construction.  She reported that a home assessment has been scheduled for 05/17/18 @ 2:00 PM.  Ronn Melena, [place] Worker [phone]

## 2018-05-08 ENCOUNTER — Encounter: Payer: Self-pay | Admitting: Podiatry

## 2018-05-08 ENCOUNTER — Ambulatory Visit (INDEPENDENT_AMBULATORY_CARE_PROVIDER_SITE_OTHER): Payer: Medicare Other | Admitting: Podiatry

## 2018-05-08 DIAGNOSIS — B351 Tinea unguium: Secondary | ICD-10-CM | POA: Diagnosis not present

## 2018-05-08 DIAGNOSIS — E1142 Type 2 diabetes mellitus with diabetic polyneuropathy: Secondary | ICD-10-CM

## 2018-05-08 DIAGNOSIS — M79676 Pain in unspecified toe(s): Secondary | ICD-10-CM

## 2018-05-08 DIAGNOSIS — S98131S Complete traumatic amputation of one right lesser toe, sequela: Secondary | ICD-10-CM

## 2018-05-09 ENCOUNTER — Encounter: Payer: Self-pay | Admitting: Podiatry

## 2018-05-09 NOTE — Progress Notes (Signed)
Complaint:  Visit Type: Patient returns to my office for continued preventative foot care services. Complaint: Patient states" my nails have grown long and thick and become painful to walk and wear shoes" Patient has been diagnosed with DM with no foot complications. The patient presents for preventative foot care services. No changes to ROS  Podiatric Exam: Vascular: dorsalis pedis  are palpable bilateral. Posterior tibial pulses are absent  B/L.  Capillary return is immediate. Temperature gradient is WNL. Skin turgor WNL  Sensorium: Normal Semmes Weinstein monofilament test. Normal tactile sensation bilaterally. Nail Exam: Pt has thick disfigured discolored nails with subungual debris noted 1-5 left and 1,4 and 5 right toes.  Healed laceration fourth digit subungually right foot.  Ulcer Exam: There is no evidence of ulcer or pre-ulcerative changes or infection. Orthopedic Exam: Muscle tone and strength are WNL. No limitations in general ROM. No crepitus or effusions noted. Foot type and digits show no abnormalities. Bony prominences are unremarkable. Amputation 2,3 right foot. Skin: No Porokeratosis. No infection or ulcers  Diagnosis:  Onychomycosis, , Pain in right toe, pain in left toes  Treatment & Plan Procedures and Treatment: Consent by patient was obtained for treatment procedures.   Debridement of mycotic and hypertrophic toenails x 8. . No ulceration, no infection noted.  Return Visit-Office Procedure: Patient instructed to return to the office for a follow up visit 3 months for continued evaluation and treatment. Reappointment with Dr.  Milinda Pointer.    Gardiner Barefoot DPM

## 2018-05-10 DIAGNOSIS — G9009 Other idiopathic peripheral autonomic neuropathy: Secondary | ICD-10-CM | POA: Diagnosis not present

## 2018-05-10 DIAGNOSIS — I10 Essential (primary) hypertension: Secondary | ICD-10-CM | POA: Diagnosis not present

## 2018-05-10 DIAGNOSIS — M1712 Unilateral primary osteoarthritis, left knee: Secondary | ICD-10-CM | POA: Diagnosis not present

## 2018-05-10 DIAGNOSIS — M8008XD Age-related osteoporosis with current pathological fracture, vertebra(e), subsequent encounter for fracture with routine healing: Secondary | ICD-10-CM | POA: Diagnosis not present

## 2018-05-10 DIAGNOSIS — F0391 Unspecified dementia with behavioral disturbance: Secondary | ICD-10-CM | POA: Diagnosis not present

## 2018-05-10 DIAGNOSIS — I482 Chronic atrial fibrillation, unspecified: Secondary | ICD-10-CM | POA: Diagnosis not present

## 2018-05-11 ENCOUNTER — Other Ambulatory Visit: Payer: Self-pay

## 2018-05-11 DIAGNOSIS — S22080S Wedge compression fracture of T11-T12 vertebra, sequela: Secondary | ICD-10-CM | POA: Diagnosis not present

## 2018-05-11 DIAGNOSIS — G8929 Other chronic pain: Secondary | ICD-10-CM | POA: Diagnosis not present

## 2018-05-11 DIAGNOSIS — R531 Weakness: Secondary | ICD-10-CM | POA: Diagnosis not present

## 2018-05-11 DIAGNOSIS — S22088D Other fracture of T11-T12 vertebra, subsequent encounter for fracture with routine healing: Secondary | ICD-10-CM | POA: Diagnosis not present

## 2018-05-11 DIAGNOSIS — S22080D Wedge compression fracture of T11-T12 vertebra, subsequent encounter for fracture with routine healing: Secondary | ICD-10-CM | POA: Diagnosis not present

## 2018-05-11 DIAGNOSIS — M545 Low back pain: Secondary | ICD-10-CM | POA: Diagnosis not present

## 2018-05-11 NOTE — Patient Outreach (Addendum)
[facility]) Care Management  05/11/2018  [name] [birth year] [id number]   Follow up call to patient's son, [name], regarding status of referrals to [facility], [facility], and [facility] for Insurance account manager.   Mr. Carter Kitten reported that he received paperwork from [facility] but has not yet completed it.   BSW reminded him of home assessment with [facility] on 05/17/18 @ 2:00 PM. Mr. Carter Kitten said that he has not been contacted by Waymond Cera at [facility].  BSW agreed to contact her.   BSW will follow up with Mr. Carter Kitten again within the next four weeks regarding status of referrals.   Addendum: Received update from Waymond Cera at [facility].  She is waiting to receive an estimate from volunteers.  She will follow up once received.   Ronn Melena, BSW Social Worker [phone]

## 2018-05-17 ENCOUNTER — Ambulatory Visit
Admission: RE | Admit: 2018-05-17 | Discharge: 2018-05-17 | Disposition: A | Discharge status: Home / Self Care | Payer: Medicare Other | Source: Ambulatory Visit | Attending: Family Medicine | Admitting: Family Medicine

## 2018-05-17 ENCOUNTER — Ambulatory Visit (INDEPENDENT_AMBULATORY_CARE_PROVIDER_SITE_OTHER): Payer: Medicare Other | Admitting: Family Medicine

## 2018-05-17 ENCOUNTER — Encounter: Payer: Self-pay | Admitting: Family Medicine

## 2018-05-17 VITALS — BP 130/78 | HR 84 | Temp 98.0°F | Resp 14 | Ht 60.0 in | Wt 156.0 lb

## 2018-05-17 DIAGNOSIS — R000 Tachycardia, unspecified: Secondary | ICD-10-CM | POA: Diagnosis not present

## 2018-05-17 DIAGNOSIS — R05 Cough: Secondary | ICD-10-CM | POA: Diagnosis not present

## 2018-05-17 DIAGNOSIS — I4819 Other persistent atrial fibrillation: Secondary | ICD-10-CM

## 2018-05-17 DIAGNOSIS — J90 Pleural effusion, not elsewhere classified: Secondary | ICD-10-CM | POA: Diagnosis not present

## 2018-05-17 DIAGNOSIS — R059 Cough, unspecified: Secondary | ICD-10-CM

## 2018-05-17 DIAGNOSIS — R0989 Other specified symptoms and signs involving the circulatory and respiratory systems: Secondary | ICD-10-CM | POA: Diagnosis not present

## 2018-05-17 MED ORDER — DILTIAZEM HCL ER COATED BEADS 180 MG PO CP24: 180.0000 mg | ORAL_CAPSULE | Freq: Every day | ORAL | 0 refills | Status: DC

## 2018-05-17 NOTE — Progress Notes (Signed)
Subjective:    Patient ID: [name], female    DOB: [birth year], 85 y.o.   MRN: [id number]  HPI 05/17/18 Tuesday, the patient developed pain under her left rib and in her left flank.  She got up and went to the bathroom and after going to the bathroom the pain resolved spontaneously.  It only lasted for a few minutes.  It was a sharp pain.  She denies any chest pain.  She denies any substernal chest pressure.  However she has been coughing a lot recently.  Cough is productive of yellow clear sputum.  Son denies any fever.  She is afebrile today.  However on exam today she is very tachycardic.  I calculated her pulse by palpation to be over 120 bpm.  She is irregularly irregular and in atrial fibrillation despite taking atenolol 50 mg a day.  On pulmonary exam, she has bibasilar crackles on both sides to the mid lung fields bilaterally.  Question is this pulmonary edema due to her rapid heart rate or possibly some type of pneumonia particular given the chest pain she was having earlier this week.  Patient does not appear clinically ill and is in no distress with no increased work of breathing.   Past Medical History:  Diagnosis Date  . [name] (abdominal aortic aneurysm) ([place]) 09/2013   3.6 cm  . Acute and chronic respiratory failure with hypoxia ([place])   . Acute delirium   . Acute encephalopathy   . Acute pyelonephritis   . Atrial fibrillation ([place])   . Chronic pain syndrome   . Constipation   . Dementia ([place])   . Dependent edema    bilateral legs   . Depression   . Diabetes mellitus    pre-diabetes  . Diverticulosis   . Diverticulosis   . Dysuria   . E coli bacteremia   . Fall   . Gross hematuria   . Hydronephrosis with renal and ureteral calculous obstruction   . Hyperlipidemia   . Hypertension   . Hypoxemia   . Idiopathic peripheral autonomic neuropathy   . Infestation by bed bug   . Nephrolithiasis   . OSA on CPAP   . Osteoarthritis   . Osteopenia    T=  -2.1 in hip  .  Persistent mood (affective) disorder, unspecified ([place])   . Prediabetes   . Rheumatoid arthritis ([place])   . Rheumatoid arthritis(714.0)   . S/P carpal tunnel release   . Sepsis secondary to UTI ([place])   . Spinal stenosis   . Unspecified fracture of fifth lumbar vertebra, initial encounter for closed fracture ([place])   . Unspecified fracture of t11-T12 vertebra, initial encounter for closed fracture ([place])   . Urinary incontinence   . Urinary tract infection    hx of   . Urolithiasis    Past Surgical History:  Procedure Laterality Date  . APPENDECTOMY    . CARPAL TUNNEL RELEASE  12/27/2006   Right subcutaneous ulnar nerve transfer -- Right open carpal tunnel release  . CHOLECYSTECTOMY    . CYSTOSCOPY W/ URETERAL STENT PLACEMENT Right 01/30/2018   Procedure: CYSTOSCOPY WITH STENT REPLACEMENT retrograde pylegram;  Surgeon: Ardis Hughs, MD;  Location: [facility];  Service: Urology;  Laterality: Right;  . CYSTOSCOPY WITH URETEROSCOPY AND STENT PLACEMENT Right 03/01/2018   Procedure: CYSTOSCPY/RIGHT URETEROSCOPY HOLMIUM LASER LITHOTRIPSY AND STENT EXCHANGE;  Surgeon: Ardis Hughs, MD;  Location: [facility];  Service: Urology;  Laterality: Right;  . FOOT SURGERY    .  HOLMIUM LASER APPLICATION Right 16/05/930   Procedure: HOLMIUM LASER APPLICATION;  Surgeon: Ardis Hughs, MD;  Location: [facility];  Service: Urology;  Laterality: Right;  . JOINT REPLACEMENT     BTKR, RTHR  . TOTAL KNEE ARTHROPLASTY  08/21/2007   Right total knee replacement   Current Outpatient Medications on File Prior to Visit  Medication Sig Dispense Refill  . acetaminophen (TYLENOL) 500 MG tablet Take 500 mg by mouth every 8 (eight) hours as needed for mild pain.     Marland Kitchen aspirin EC 81 MG tablet Take 81 mg by mouth daily.    Marland Kitchen atenolol (TENORMIN) 50 MG tablet Take 1 tablet (50 mg total) by mouth daily. 90 tablet 3  . Cholecalciferol (VITAMIN D3) 2000 units TABS Take 2,000 Units by mouth daily.    . DULoxetine  (CYMBALTA) 30 MG capsule Take 90 mg by mouth daily.   2  . gabapentin (NEURONTIN) 800 MG tablet Take 800-1,600 mg by mouth as directed. 800 mg in the morning, 800 mg at noon, 1600 mg at night    . oxyCODONE-acetaminophen (PERCOCET) 7.5-325 MG tablet Take 1 tablet by mouth every 8 (eight) hours as needed for severe pain. 90 tablet 0  . pantoprazole (PROTONIX) 40 MG tablet Take 1 tablet (40 mg total) by mouth daily. 90 tablet 3  . rosuvastatin (CRESTOR) 40 MG tablet Take 1 tablet (40 mg total) by mouth every evening. 90 tablet 3   No current facility-administered medications on file prior to visit.    Allergies  Allergen Reactions  . Sulfonamide Derivatives Hives and Rash   Social History   Socioeconomic History  . Marital status: Divorced    Spouse name: Not on file  . Number of children: 6  . Years of education: Not on file  . Highest education level: Not on file  Occupational History  . Not on file  Social Needs  . Financial resource strain: Not on file  . Food insecurity:    Worry: Not on file    Inability: Not on file  . Transportation needs:    Medical: Not on file    Non-medical: Not on file  Tobacco Use  . Smoking status: Never Smoker  . Smokeless tobacco: Never Used  Substance and Sexual Activity  . Alcohol use: No  . Drug use: No  . Sexual activity: Not on file  Lifestyle  . Physical activity:    Days per week: Not on file    Minutes per session: Not on file  . Stress: Not on file  Relationships  . Social connections:    Talks on phone: Not on file    Gets together: Not on file    Attends religious service: Not on file    Active member of club or organization: Not on file    Attends meetings of clubs or organizations: Not on file    Relationship status: Not on file  . Intimate partner violence:    Fear of current or ex partner: Not on file    Emotionally abused: Not on file    Physically abused: Not on file    Forced sexual activity: Not on file  Other  Topics Concern  . Not on file  Social History Narrative  . Not on file    Review of Systems  All other systems reviewed and are negative.      Objective:   Physical Exam Vitals signs reviewed.  Constitutional:      General: She is  not in acute distress.    Appearance: She is well-developed. She is not diaphoretic.  Cardiovascular:     Rate and Rhythm: Tachycardia present. Rhythm irregular.     Heart sounds: No murmur. No friction rub. No gallop.   Pulmonary:     Effort: Pulmonary effort is normal. No respiratory distress.     Breath sounds: No stridor or decreased air movement. Examination of the right-lower field reveals rales. Examination of the left-lower field reveals rales. Rales present. No decreased breath sounds, wheezing or rhonchi.  Chest:     Chest wall: No tenderness.  Abdominal:     General: Bowel sounds are normal. There is no distension.     Palpations: Abdomen is soft. There is no mass.     Tenderness: There is no abdominal tenderness. There is no guarding or rebound.     Hernia: No hernia is present.  Musculoskeletal:     Right lower leg: No edema.     Left lower leg: No edema.           Assessment & Plan:  Cough - Plan: DG Chest 2 View  Persistent atrial fibrillation  Tachycardia  Abnormal lung sounds  I am not certain of the source of her chest pain but it does not sound cardiac in nature.  It could have been muscular however it has resolved and has not returned.  However her exam is difficult for tachycardia due to atrial fibrillation as well as bilateral crackles that could be signs of pulmonary edema although there is no evidence of pitting edema in her extremities.  Therefore I will add Cardizem CD 180 mg p.o. daily to her atenolol to better control her heart rate which I calculated to be in the 120s on palpation.  I will also send the patient for chest x-ray to determine if this is bilateral pulmonary edema versus atelectasis versus infiltrate.   If there is evidence of edema, she will need mild diuresis and then recheck next week.  If there is an infiltrate, I will start the patient on antibiotics and recheck next week.

## 2018-05-21 ENCOUNTER — Encounter: Payer: Self-pay | Admitting: Family Medicine

## 2018-05-21 ENCOUNTER — Ambulatory Visit (INDEPENDENT_AMBULATORY_CARE_PROVIDER_SITE_OTHER): Payer: Medicare Other | Admitting: Family Medicine

## 2018-05-21 VITALS — BP 120/72 | HR 88 | Temp 98.2°F | Resp 18 | Ht 60.0 in | Wt 156.0 lb

## 2018-05-21 DIAGNOSIS — R000 Tachycardia, unspecified: Secondary | ICD-10-CM | POA: Diagnosis not present

## 2018-05-21 DIAGNOSIS — I4819 Other persistent atrial fibrillation: Secondary | ICD-10-CM

## 2018-05-21 DIAGNOSIS — R0989 Other specified symptoms and signs involving the circulatory and respiratory systems: Secondary | ICD-10-CM

## 2018-05-21 MED ORDER — FUROSEMIDE 20 MG PO TABS: 20.0000 mg | ORAL_TABLET | Freq: Every day | ORAL | 3 refills | Status: DC

## 2018-05-21 NOTE — Progress Notes (Signed)
Subjective:    Patient ID: [name], female    DOB: 26-Jan-1934, 85 y.o.   MRN: [id number]  HPI 05/17/18 Tuesday, the patient developed pain under her left rib and in her left flank.  She got up and went to the bathroom and after going to the bathroom the pain resolved spontaneously.  It only lasted for a few minutes.  It was a sharp pain.  She denies any chest pain.  She denies any substernal chest pressure.  However she has been coughing a lot recently.  Cough is productive of yellow clear sputum.  Son denies any fever.  She is afebrile today.  However on exam today she is very tachycardic.  I calculated her pulse by palpation to be over 120 bpm.  She is irregularly irregular and in atrial fibrillation despite taking atenolol 50 mg a day.  On pulmonary exam, she has bibasilar crackles on both sides to the mid lung fields bilaterally.  Question is this pulmonary edema due to her rapid heart rate or possibly some type of pneumonia particular given the chest pain she was having earlier this week.  Patient does not appear clinically ill and is in no distress with no increased work of breathing.  At that time, my plan was: I am not certain of the source of her chest pain but it does not sound cardiac in nature.  It could have been muscular however it has resolved and has not returned.  However her exam is difficult for tachycardia due to atrial fibrillation as well as bilateral crackles that could be signs of pulmonary edema although there is no evidence of pitting edema in her extremities.  Therefore I will add Cardizem CD 180 mg p.o. daily to her atenolol to better control her heart rate which I calculated to be in the 120s on palpation.  I will also send the patient for chest x-ray to determine if this is bilateral pulmonary edema versus atelectasis versus infiltrate.  If there is evidence of edema, she will need mild diuresis and then recheck next week.  If there is an infiltrate, I will start the  patient on antibiotics and recheck next week.  05/21/18  Patient is here today for recheck.  Despite adding Cardizem, her heart rate is still 110 bpm when I check her pulse today manually.  She also continues to have bibasilar crackles and congestion.  There is also trace to +1 bipedal edema to the midshin.  I am concerned that her atrial fibrillation being out of control is leading to the accumulation of edema.  Her son does report that she has been coughing more.  She denies any chest pain.  She denies any fever.  She denies any pleurisy  Past Medical History:  Diagnosis Date  . [name] (abdominal aortic aneurysm) ([place]) 09/2013   3.6 cm  . Acute and chronic respiratory failure with hypoxia ([place])   . Acute delirium   . Acute encephalopathy   . Acute pyelonephritis   . Atrial fibrillation ([place])   . Chronic pain syndrome   . Constipation   . Dementia ([place])   . Dependent edema    bilateral legs   . Depression   . Diabetes mellitus    pre-diabetes  . Diverticulosis   . Diverticulosis   . Dysuria   . E coli bacteremia   . Fall   . Gross hematuria   . Hydronephrosis with renal and ureteral calculous obstruction   . Hyperlipidemia   .  Hypertension   . Hypoxemia   . Idiopathic peripheral autonomic neuropathy   . Infestation by bed bug   . Nephrolithiasis   . OSA on CPAP   . Osteoarthritis   . Osteopenia    T=  -2.1 in hip  . Persistent mood (affective) disorder, unspecified ([place])   . Prediabetes   . Rheumatoid arthritis ([place])   . Rheumatoid arthritis(714.0)   . S/P carpal tunnel release   . Sepsis secondary to UTI ([place])   . Spinal stenosis   . Unspecified fracture of fifth lumbar vertebra, initial encounter for closed fracture ([place])   . Unspecified fracture of t11-T12 vertebra, initial encounter for closed fracture ([place])   . Urinary incontinence   . Urinary tract infection    hx of   . Urolithiasis    Past Surgical History:  Procedure Laterality Date  . APPENDECTOMY    .  CARPAL TUNNEL RELEASE  12/27/2006   Right subcutaneous ulnar nerve transfer -- Right open carpal tunnel release  . CHOLECYSTECTOMY    . CYSTOSCOPY W/ URETERAL STENT PLACEMENT Right 01/30/2018   Procedure: CYSTOSCOPY WITH STENT REPLACEMENT retrograde pylegram;  Surgeon: Ardis Hughs, MD;  Location: [facility];  Service: Urology;  Laterality: Right;  . CYSTOSCOPY WITH URETEROSCOPY AND STENT PLACEMENT Right 03/01/2018   Procedure: CYSTOSCPY/RIGHT URETEROSCOPY HOLMIUM LASER LITHOTRIPSY AND STENT EXCHANGE;  Surgeon: Ardis Hughs, MD;  Location: [facility];  Service: Urology;  Laterality: Right;  . FOOT SURGERY    . HOLMIUM LASER APPLICATION Right 41/96/2229   Procedure: HOLMIUM LASER APPLICATION;  Surgeon: Ardis Hughs, MD;  [facility];  Service: Urology;  Laterality: Right;  . JOINT REPLACEMENT     BTKR, RTHR  . TOTAL KNEE ARTHROPLASTY  08/21/2007   Right total knee replacement   Current Outpatient Medications on File Prior to Visit  Medication Sig Dispense Refill  . acetaminophen (TYLENOL) 500 MG tablet Take 500 mg by mouth every 8 (eight) hours as needed for mild pain.     Marland Kitchen aspirin EC 81 MG tablet Take 81 mg by mouth daily.    Marland Kitchen atenolol (TENORMIN) 50 MG tablet Take 1 tablet (50 mg total) by mouth daily. 90 tablet 3  . Cholecalciferol (VITAMIN D3) 2000 units TABS Take 2,000 Units by mouth daily.    Marland Kitchen diltiazem (CARDIZEM CD) 180 MG 24 hr capsule Take 1 capsule (180 mg total) by mouth daily. 30 capsule 0  . DULoxetine (CYMBALTA) 30 MG capsule Take 90 mg by mouth daily.   2  . gabapentin (NEURONTIN) 800 MG tablet Take 800-1,600 mg by mouth as directed. 800 mg in the morning, 800 mg at noon, 1600 mg at night    . oxyCODONE-acetaminophen (PERCOCET) 7.5-325 MG tablet Take 1 tablet by mouth every 8 (eight) hours as needed for severe pain. 90 tablet 0  . pantoprazole (PROTONIX) 40 MG tablet Take 1 tablet (40 mg total) by mouth daily. 90 tablet 3  . rosuvastatin (CRESTOR) 40 MG  tablet Take 1 tablet (40 mg total) by mouth every evening. 90 tablet 3   No current facility-administered medications on file prior to visit.    Allergies  Allergen Reactions  . Sulfonamide Derivatives Hives and Rash   Social History   Socioeconomic History  . Marital status: Divorced    Spouse name: Not on file  . Number of children: 6  . Years of education: Not on file  . Highest education level: Not on file  Occupational History  .  Not on file  Social Needs  . Financial resource strain: Not on file  . Food insecurity:    Worry: Not on file    Inability: Not on file  . Transportation needs:    Medical: Not on file    Non-medical: Not on file  Tobacco Use  . Smoking status: Never Smoker  . Smokeless tobacco: Never Used  Substance and Sexual Activity  . Alcohol use: No  . Drug use: No  . Sexual activity: Not on file  Lifestyle  . Physical activity:    Days per week: Not on file    Minutes per session: Not on file  . Stress: Not on file  Relationships  . Social connections:    Talks on phone: Not on file    Gets together: Not on file    Attends religious service: Not on file    Active member of club or organization: Not on file    Attends meetings of clubs or organizations: Not on file    Relationship status: Not on file  . Intimate partner violence:    Fear of current or ex partner: Not on file    Emotionally abused: Not on file    Physically abused: Not on file    Forced sexual activity: Not on file  Other Topics Concern  . Not on file  Social History Narrative  . Not on file    Review of Systems  All other systems reviewed and are negative.      Objective:   Physical Exam Vitals signs reviewed.  Constitutional:      General: She is not in acute distress.    Appearance: She is well-developed. She is not diaphoretic.  Cardiovascular:     Rate and Rhythm: Tachycardia present. Rhythm irregular.     Heart sounds: No murmur. No friction rub. No  gallop.   Pulmonary:     Effort: Pulmonary effort is normal. No respiratory distress.     Breath sounds: No stridor or decreased air movement. Examination of the right-lower field reveals rales. Examination of the left-lower field reveals rales. Rales present. No decreased breath sounds, wheezing or rhonchi.  Chest:     Chest wall: No tenderness.  Abdominal:     General: Bowel sounds are normal. There is no distension.     Palpations: Abdomen is soft. There is no mass.     Tenderness: There is no abdominal tenderness. There is no guarding or rebound.     Hernia: No hernia is present.  Musculoskeletal:     Right lower leg: No edema.     Left lower leg: No edema.           Assessment & Plan:  Tachycardia  Persistent atrial fibrillation  Abnormal lung sounds  Increase atenolol to 100 mg a day.  Continue Cardizem at his current dose.  Add Lasix 20 mg a day.  Recheck heart rate every day at home and notify me of the values.  Goal heart rates between 60 and 90 bpm.  Recheck patient clinically next week and obtain BMP at that time.

## 2018-05-28 ENCOUNTER — Encounter: Payer: Self-pay | Admitting: Family Medicine

## 2018-05-28 ENCOUNTER — Ambulatory Visit (INDEPENDENT_AMBULATORY_CARE_PROVIDER_SITE_OTHER): Payer: Medicare Other | Admitting: Family Medicine

## 2018-05-28 VITALS — BP 110/68 | HR 100 | Temp 98.0°F | Resp 14

## 2018-05-28 DIAGNOSIS — I4819 Other persistent atrial fibrillation: Secondary | ICD-10-CM

## 2018-05-28 DIAGNOSIS — R0989 Other specified symptoms and signs involving the circulatory and respiratory systems: Secondary | ICD-10-CM

## 2018-05-28 NOTE — Progress Notes (Signed)
Subjective:    Patient ID: [name], female    DOB: [birth year], 85 y.o.   MRN: [id number]  HPI 05/17/18 Tuesday, the patient developed pain under her left rib and in her left flank.  She got up and went to the bathroom and after going to the bathroom the pain resolved spontaneously.  It only lasted for a few minutes.  It was a sharp pain.  She denies any chest pain.  She denies any substernal chest pressure.  However she has been coughing a lot recently.  Cough is productive of yellow clear sputum.  Son denies any fever.  She is afebrile today.  However on exam today she is very tachycardic.  I calculated her pulse by palpation to be over 120 bpm.  She is irregularly irregular and in atrial fibrillation despite taking atenolol 50 mg a day.  On pulmonary exam, she has bibasilar crackles on both sides to the mid lung fields bilaterally.  Question is this pulmonary edema due to her rapid heart rate or possibly some type of pneumonia particular given the chest pain she was having earlier this week.  Patient does not appear clinically ill and is in no distress with no increased work of breathing.  At that time, my plan was: I am not certain of the source of her chest pain but it does not sound cardiac in nature.  It could have been muscular however it has resolved and has not returned.  However her exam is difficult for tachycardia due to atrial fibrillation as well as bilateral crackles that could be signs of pulmonary edema although there is no evidence of pitting edema in her extremities.  Therefore I will add Cardizem CD 180 mg p.o. daily to her atenolol to better control her heart rate which I calculated to be in the 120s on palpation.  I will also send the patient for chest x-ray to determine if this is bilateral pulmonary edema versus atelectasis versus infiltrate.  If there is evidence of edema, she will need mild diuresis and then recheck next week.  If there is an infiltrate, I will start the  patient on antibiotics and recheck next week.  05/21/18  Patient is here today for recheck.  Despite adding Cardizem, her heart rate is still 110 bpm when I check her pulse today manually.  She also continues to have bibasilar crackles and congestion.  There is also trace to +1 bipedal edema to the midshin.  I am concerned that her atrial fibrillation being out of control is leading to the accumulation of edema.  Her son does report that she has been coughing more.  She denies any chest pain.  She denies any fever.  She denies any pleurisy.  At that time, my plan was: Increase atenolol to 100 mg a day.  Continue Cardizem at his current dose.  Add Lasix 20 mg a day.  Recheck heart rate every day at home and notify me of the values.  Goal heart rates between 60 and 90 bpm.  Recheck patient clinically next week and obtain BMP at that time.  05/28/18 Patient is here today for follow-up.  Heart rate has improved dramatically since increasing atenolol to 100 mg a day and adding Cardizem.  Today she is 70 to 80 bpm although she is still in atrial fibrillation.  She denies any shortness of breath.  She denies any chest pain.  She does still cough however the cough is nonproductive.  Pulmonary exam today  is much improved and the bibasilar crackles have improved and decreased.  There is no pitting edema in her extremities.  Lungs are much clear on examination.  She has over the last 24 hours developed some diarrhea.  Her son is sick with a stomach virus and also has diarrhea.  Past Medical History:  Diagnosis Date  . [name] (abdominal aortic aneurysm) ([place]) 09/2013   3.6 cm  . Acute and chronic respiratory failure with hypoxia ([place])   . Acute delirium   . Acute encephalopathy   . Acute pyelonephritis   . Atrial fibrillation ([place])   . Chronic pain syndrome   . Constipation   . Dementia ([place])   . Dependent edema    bilateral legs   . Depression   . Diabetes mellitus    pre-diabetes  . Diverticulosis   .  Diverticulosis   . Dysuria   . E coli bacteremia   . Fall   . Gross hematuria   . Hydronephrosis with renal and ureteral calculous obstruction   . Hyperlipidemia   . Hypertension   . Hypoxemia   . Idiopathic peripheral autonomic neuropathy   . Infestation by bed bug   . Nephrolithiasis   . OSA on CPAP   . Osteoarthritis   . Osteopenia    T=  -2.1 in hip  . Persistent mood (affective) disorder, unspecified ([place])   . Prediabetes   . Rheumatoid arthritis ([place])   . Rheumatoid arthritis(714.0)   . S/P carpal tunnel release   . Sepsis secondary to UTI ([place])   . Spinal stenosis   . Unspecified fracture of fifth lumbar vertebra, initial encounter for closed fracture ([place])   . Unspecified fracture of t11-T12 vertebra, initial encounter for closed fracture ([place])   . Urinary incontinence   . Urinary tract infection    hx of   . Urolithiasis    Past Surgical History:  Procedure Laterality Date  . APPENDECTOMY    . CARPAL TUNNEL RELEASE  12/27/2006   Right subcutaneous ulnar nerve transfer -- Right open carpal tunnel release  . CHOLECYSTECTOMY    . CYSTOSCOPY W/ URETERAL STENT PLACEMENT Right 01/30/2018   Procedure: CYSTOSCOPY WITH STENT REPLACEMENT retrograde pylegram;  Surgeon: Ardis Hughs, MD;  Location: [facility];  Service: Urology;  Laterality: Right;  . CYSTOSCOPY WITH URETEROSCOPY AND STENT PLACEMENT Right 03/01/2018   Procedure: CYSTOSCPY/RIGHT URETEROSCOPY HOLMIUM LASER LITHOTRIPSY AND STENT EXCHANGE;  Surgeon: Ardis Hughs, MD;  Location: [facility];  Service: Urology;  Laterality: Right;  . FOOT SURGERY    . HOLMIUM LASER APPLICATION Right 46/27/0350   Procedure: HOLMIUM LASER APPLICATION;  Surgeon: Ardis Hughs, MD;  Location: [facility];  Service: Urology;  Laterality: Right;  . JOINT REPLACEMENT     BTKR, RTHR  . TOTAL KNEE ARTHROPLASTY  08/21/2007   Right total knee replacement   Current Outpatient Medications on File Prior to Visit  Medication Sig  Dispense Refill  . acetaminophen (TYLENOL) 500 MG tablet Take 500 mg by mouth every 8 (eight) hours as needed for mild pain.     Marland Kitchen aspirin EC 81 MG tablet Take 81 mg by mouth daily.    Marland Kitchen atenolol (TENORMIN) 50 MG tablet Take 1 tablet (50 mg total) by mouth daily. 90 tablet 3  . Cholecalciferol (VITAMIN D3) 2000 units TABS Take 2,000 Units by mouth daily.    Marland Kitchen diltiazem (CARDIZEM CD) 180 MG 24 hr capsule Take 1 capsule (180 mg total) by mouth daily. 30 capsule  0  . DULoxetine (CYMBALTA) 30 MG capsule Take 90 mg by mouth daily.   2  . furosemide (LASIX) 20 MG tablet Take 1 tablet (20 mg total) by mouth daily. 30 tablet 3  . gabapentin (NEURONTIN) 800 MG tablet Take 800-1,600 mg by mouth as directed. 800 mg in the morning, 800 mg at noon, 1600 mg at night    . oxyCODONE-acetaminophen (PERCOCET) 7.5-325 MG tablet Take 1 tablet by mouth every 8 (eight) hours as needed for severe pain. 90 tablet 0  . pantoprazole (PROTONIX) 40 MG tablet Take 1 tablet (40 mg total) by mouth daily. 90 tablet 3  . rosuvastatin (CRESTOR) 40 MG tablet Take 1 tablet (40 mg total) by mouth every evening. 90 tablet 3   No current facility-administered medications on file prior to visit.    Allergies  Allergen Reactions  . Sulfonamide Derivatives Hives and Rash   Social History   Socioeconomic History  . Marital status: Divorced    Spouse name: Not on file  . Number of children: 6  . Years of education: Not on file  . Highest education level: Not on file  Occupational History  . Not on file  Social Needs  . Financial resource strain: Not on file  . Food insecurity:    Worry: Not on file    Inability: Not on file  . Transportation needs:    Medical: Not on file    Non-medical: Not on file  Tobacco Use  . Smoking status: Never Smoker  . Smokeless tobacco: Never Used  Substance and Sexual Activity  . Alcohol use: No  . Drug use: No  . Sexual activity: Not on file  Lifestyle  . Physical activity:    Days  per week: Not on file    Minutes per session: Not on file  . Stress: Not on file  Relationships  . Social connections:    Talks on phone: Not on file    Gets together: Not on file    Attends religious service: Not on file    Active member of club or organization: Not on file    Attends meetings of clubs or organizations: Not on file    Relationship status: Not on file  . Intimate partner violence:    Fear of current or ex partner: Not on file    Emotionally abused: Not on file    Physically abused: Not on file    Forced sexual activity: Not on file  Other Topics Concern  . Not on file  Social History Narrative  . Not on file    Review of Systems  All other systems reviewed and are negative.      Objective:   Physical Exam Vitals signs reviewed.  Constitutional:      General: She is not in acute distress.    Appearance: She is well-developed. She is not diaphoretic.  Cardiovascular:     Rate and Rhythm: Rhythm irregular.     Heart sounds: No murmur. No friction rub. No gallop.   Pulmonary:     Effort: Pulmonary effort is normal. No respiratory distress.     Breath sounds: No stridor or decreased air movement. No rales. No decreased breath sounds, wheezing or rhonchi.  Chest:     Chest wall: No tenderness.  Abdominal:     General: Bowel sounds are normal. There is no distension.     Palpations: Abdomen is soft. There is no mass.     Tenderness: There is no  abdominal tenderness. There is no guarding or rebound.     Hernia: No hernia is present.  Musculoskeletal:     Right lower leg: No edema.     Left lower leg: No edema.           Assessment & Plan:  Persistent atrial fibrillation  Abnormal lung sounds  Patient's exam has improved.  I have recommended staying on the Cardizem as well as the higher dose of atenolol.  However I recommended that we discontinue furosemide to avoid dehydration particular given her diarrhea and use just on an as-needed basis.  She  can use Imodium as needed for diarrhea

## 2018-06-06 DIAGNOSIS — Z9181 History of falling: Secondary | ICD-10-CM | POA: Diagnosis not present

## 2018-06-06 DIAGNOSIS — G629 Polyneuropathy, unspecified: Secondary | ICD-10-CM | POA: Diagnosis not present

## 2018-06-06 DIAGNOSIS — G8929 Other chronic pain: Secondary | ICD-10-CM | POA: Diagnosis not present

## 2018-06-06 DIAGNOSIS — M545 Low back pain, unspecified: Secondary | ICD-10-CM | POA: Insufficient documentation

## 2018-06-06 DIAGNOSIS — K219 Gastro-esophageal reflux disease without esophagitis: Secondary | ICD-10-CM | POA: Diagnosis not present

## 2018-06-06 DIAGNOSIS — S22080S Wedge compression fracture of T11-T12 vertebra, sequela: Secondary | ICD-10-CM | POA: Diagnosis not present

## 2018-06-06 DIAGNOSIS — F0390 Unspecified dementia without behavioral disturbance: Secondary | ICD-10-CM | POA: Insufficient documentation

## 2018-06-06 DIAGNOSIS — G894 Chronic pain syndrome: Secondary | ICD-10-CM | POA: Diagnosis not present

## 2018-06-06 DIAGNOSIS — M810 Age-related osteoporosis without current pathological fracture: Secondary | ICD-10-CM | POA: Diagnosis not present

## 2018-06-06 DIAGNOSIS — I4891 Unspecified atrial fibrillation: Secondary | ICD-10-CM | POA: Diagnosis not present

## 2018-06-06 DIAGNOSIS — Z8781 Personal history of (healed) traumatic fracture: Secondary | ICD-10-CM | POA: Diagnosis not present

## 2018-06-08 ENCOUNTER — Emergency Department (HOSPITAL_COMMUNITY): Payer: Medicare Other

## 2018-06-08 ENCOUNTER — Other Ambulatory Visit: Payer: Self-pay | Admitting: Family Medicine

## 2018-06-08 ENCOUNTER — Other Ambulatory Visit: Payer: Self-pay

## 2018-06-08 ENCOUNTER — Emergency Department (HOSPITAL_COMMUNITY)
Admission: EM | Admit: 2018-06-08 | Discharge: 2018-06-09 | Disposition: A | Discharge status: Home / Self Care | Payer: Medicare Other | Attending: Emergency Medicine | Admitting: Emergency Medicine

## 2018-06-08 ENCOUNTER — Encounter (HOSPITAL_COMMUNITY): Payer: Self-pay | Admitting: Emergency Medicine

## 2018-06-08 DIAGNOSIS — R7989 Other specified abnormal findings of blood chemistry: Secondary | ICD-10-CM | POA: Diagnosis not present

## 2018-06-08 DIAGNOSIS — R0781 Pleurodynia: Secondary | ICD-10-CM | POA: Diagnosis not present

## 2018-06-08 DIAGNOSIS — F0390 Unspecified dementia without behavioral disturbance: Secondary | ICD-10-CM | POA: Diagnosis not present

## 2018-06-08 DIAGNOSIS — I10 Essential (primary) hypertension: Secondary | ICD-10-CM | POA: Insufficient documentation

## 2018-06-08 DIAGNOSIS — F329 Major depressive disorder, single episode, unspecified: Secondary | ICD-10-CM | POA: Insufficient documentation

## 2018-06-08 DIAGNOSIS — I700 Atherosclerosis of aorta: Secondary | ICD-10-CM | POA: Diagnosis not present

## 2018-06-08 DIAGNOSIS — Z79899 Other long term (current) drug therapy: Secondary | ICD-10-CM | POA: Insufficient documentation

## 2018-06-08 DIAGNOSIS — R071 Chest pain on breathing: Secondary | ICD-10-CM | POA: Diagnosis not present

## 2018-06-08 DIAGNOSIS — J90 Pleural effusion, not elsewhere classified: Secondary | ICD-10-CM | POA: Diagnosis not present

## 2018-06-08 DIAGNOSIS — Z7982 Long term (current) use of aspirin: Secondary | ICD-10-CM | POA: Diagnosis not present

## 2018-06-08 DIAGNOSIS — D649 Anemia, unspecified: Secondary | ICD-10-CM | POA: Insufficient documentation

## 2018-06-08 DIAGNOSIS — R0602 Shortness of breath: Secondary | ICD-10-CM | POA: Diagnosis present

## 2018-06-08 DIAGNOSIS — N390 Urinary tract infection, site not specified: Secondary | ICD-10-CM | POA: Diagnosis not present

## 2018-06-08 DIAGNOSIS — E119 Type 2 diabetes mellitus without complications: Secondary | ICD-10-CM | POA: Insufficient documentation

## 2018-06-08 MED ORDER — ACETAMINOPHEN 325 MG PO TABS
650.0000 mg | ORAL_TABLET | Freq: Once | ORAL | Status: AC
  Administered 2018-06-09: 650 mg via ORAL
  Filled 2018-06-08: qty 2

## 2018-06-08 MED ORDER — SODIUM CHLORIDE 0.9% FLUSH: 3.0000 mL | Freq: Once | INTRAVENOUS | Status: DC

## 2018-06-08 NOTE — ED Triage Notes (Signed)
Pt reports dry cough for three days. Hurting to right side rib area, worse with inspiration.

## 2018-06-08 NOTE — ED Provider Notes (Signed)
[facility] EMERGENCY DEPARTMENT Provider Note   CSN: [id number] Arrival date & time: 06/08/18  2235     History   Chief Complaint Chief Complaint  Patient presents with  . Shortness of Breath    HPI [name] is a 85 y.o. female.  The history is provided by the patient and a relative. The history is limited by the condition of the patient (Dementia).  Shortness of Breath  She has history of diabetes, hypertension, hyperlipidemia, dementia, atrial fibrillation, rheumatoid arthritis, chronic pain and comes in complaining of right posterior rib cage pain for the last 2-3 days.  Pain is worse with taking a deep breath.  There has been a nonproductive cough.  There has been no known fever, chills, sweats and no change in chronic body pain.  She does have history of urinary tract infections.  Mental status has been at baseline, and she usually has increased confusion when she gets urinary tract infections.  There have been no known sick contacts.  Past Medical History:  Diagnosis Date  . [name] (abdominal aortic aneurysm) ([place]) 09/2013   3.6 cm  . Acute and chronic respiratory failure with hypoxia ([place])   . Acute delirium   . Acute encephalopathy   . Acute pyelonephritis   . Atrial fibrillation ([place])   . Chronic pain syndrome   . Constipation   . Dementia ([place])   . Dependent edema    bilateral legs   . Depression   . Diabetes mellitus    pre-diabetes  . Diverticulosis   . Diverticulosis   . Dysuria   . E coli bacteremia   . Fall   . Gross hematuria   . Hydronephrosis with renal and ureteral calculous obstruction   . Hyperlipidemia   . Hypertension   . Hypoxemia   . Idiopathic peripheral autonomic neuropathy   . Infestation by bed bug   . Nephrolithiasis   . OSA on CPAP   . Osteoarthritis   . Osteopenia    T=  -2.1 in hip  . Persistent mood (affective) disorder, unspecified ([place])   . Prediabetes   . Rheumatoid arthritis ([place])   . Rheumatoid arthritis(714.0)   .  S/P carpal tunnel release   . Sepsis secondary to UTI ([place])   . Spinal stenosis   . Unspecified fracture of fifth lumbar vertebra, initial encounter for closed fracture ([place])   . Unspecified fracture of t11-T12 vertebra, initial encounter for closed fracture ([place])   . Urinary incontinence   . Urinary tract infection    hx of   . Urolithiasis     Patient Active Problem List   Diagnosis Date Noted  . Atelectasis of left lung 04/06/2018  . Acute encephalopathy 02/09/2018  . Infestation by bed bug 02/03/2018  . Urinary tract infection due to extended-spectrum beta lactamase (ESBL) producing Escherichia coli 02/02/2018  . Chronic a-fib 02/02/2018  . Constipation 01/31/2018  . Urolithiasis 01/30/2018  . Fall   . T12 compression fracture ([place]) 09/04/2017  . Fall at home, initial encounter 07/03/2017  . Tachycardia 02/03/2017  . Chronic pain syndrome 02/02/2017  . Abnormal EKG 11/21/2013  . [name] (abdominal aortic aneurysm) ([place]) 09/30/2013  . OSA (obstructive sleep apnea) 06/29/2012  . Dementia ([place]) 06/29/2012  . Pre-diabetes 06/29/2012  . Hypoxia, sleep related 06/29/2012  . RA (rheumatoid arthritis) ([place]) 06/29/2012  . Nonspecific abnormal results of cardiovascular function study 06/02/2011  . Nonspecific abnormal electrocardiogram (ECG) (EKG) 05/18/2011  . OSTEOPOROSIS 04/09/2008  . Hyperlipidemia 05/12/2006  .  DEPENDENT EDEMA, LEGS, BILATERAL 05/12/2006  . PERIPHERAL NEUROPATHY 03/11/2006  . Essential hypertension 03/11/2006    Past Surgical History:  Procedure Laterality Date  . APPENDECTOMY    . CARPAL TUNNEL RELEASE  12/27/2006   Right subcutaneous ulnar nerve transfer -- Right open carpal tunnel release  . CHOLECYSTECTOMY    . CYSTOSCOPY W/ URETERAL STENT PLACEMENT Right 01/30/2018   Procedure: CYSTOSCOPY WITH STENT REPLACEMENT retrograde pylegram;  Surgeon: Ardis Hughs, MD;  Location[facility];  Service: Urology;  Laterality: Right;  . CYSTOSCOPY WITH  URETEROSCOPY AND STENT PLACEMENT Right 03/01/2018   Procedure: CYSTOSCPY/RIGHT URETEROSCOPY HOLMIUM LASER LITHOTRIPSY AND STENT EXCHANGE;  Surgeon: Ardis Hughs, MD;  Location: [facility];  Service: Urology;  Laterality: Right;  . FOOT SURGERY    . HOLMIUM LASER APPLICATION Right 26/94/8546   Procedure: HOLMIUM LASER APPLICATION;  Surgeon: Ardis Hughs, MD;  [facility];  Service: Urology;  Laterality: Right;  . JOINT REPLACEMENT     BTKR, RTHR  . TOTAL KNEE ARTHROPLASTY  08/21/2007   Right total knee replacement     OB History   No obstetric history on file.      Home Medications    Prior to Admission medications   Medication Sig Start Date End Date Taking? Authorizing Provider  acetaminophen (TYLENOL) 500 MG tablet Take 500 mg by mouth every 8 (eight) hours as needed for mild pain.     [provider]  aspirin EC 81 MG tablet Take 81 mg by mouth daily.    [provider]  atenolol (TENORMIN) 50 MG tablet Take 1 tablet (50 mg total) by mouth daily. 04/26/18   [name], MD  Cholecalciferol (VITAMIN D3) 2000 units TABS Take 2,000 Units by mouth daily.    [provider]  diltiazem (CARDIZEM CD) 180 MG 24 hr capsule TAKE 1 CAPSULE BY MOUTH EVERY DAY 06/08/18   [name], MD  DULoxetine (CYMBALTA) 30 MG capsule Take 90 mg by mouth daily.  07/29/14   [provider]  gabapentin (NEURONTIN) 800 MG tablet Take 800-1,600 mg by mouth as directed. 800 mg in the morning, 800 mg at noon, 1600 mg at night 03/26/18   [provider]  oxyCODONE-acetaminophen (PERCOCET) 7.5-325 MG tablet Take 1 tablet by mouth every 8 (eight) hours as needed for severe pain. 04/26/18   [name], MD  pantoprazole (PROTONIX) 40 MG tablet Take 1 tablet (40 mg total) by mouth daily. 04/26/18   [name], MD  rosuvastatin (CRESTOR) 40 MG tablet Take 1 tablet (40 mg total) by mouth every evening. 04/26/18   [name], MD     Family History Family History  Problem Relation Age of Onset  . Stroke Father   . Coronary artery disease Son        CABG at age 71  . Heart disease Son   . Heart attack Son   . Stroke Mother   . Hypertension Sister   . Diabetes Brother   . Hypertension Maternal Aunt     Social History Social History   Tobacco Use  . Smoking status: Never Smoker  . Smokeless tobacco: Never Used  Substance Use Topics  . Alcohol use: No  . Drug use: No     Allergies   Sulfonamide derivatives   Review of Systems Review of Systems  Unable to perform ROS: Dementia  Respiratory: Positive for shortness of breath.      Physical Exam Updated Vital  Signs BP 105/71 (BP Location: Left Arm)   Pulse 65   Temp 97.7 F (36.5 C) (Oral)   Resp 20   Ht 5\' 4"  (1.626 m)   Wt 71.7 kg   SpO2 95%   BMI 27.12 kg/m   Physical Exam Vitals signs and nursing note reviewed.    85 year old female, resting comfortably and in no acute distress. Vital signs are normal. Oxygen saturation is 95%, which is normal. Head is normocephalic and atraumatic. PERRLA, EOMI. Oropharynx is clear. Neck is nontender and supple without adenopathy or JVD. Back is nontender in the midline.  There is mild right CVA tenderness. Lungs are clear without rales, wheezes, or rhonchi. Chest is mildly tender in the right posterior rib cage with maximum tenderness in the region of the costovertebral angle. Heart has regular rate and rhythm without murmur. Abdomen is soft, flat, nontender without masses or hepatosplenomegaly and peristalsis is normoactive. Extremities have trace edema, full range of motion is present. Skin is warm and dry without rash. Neurologic: Awake and alert and able to answer questions, cranial nerves are intact, there are no motor or sensory deficits.  ED Treatments / Results  Labs (all labs ordered are listed, but only abnormal results are displayed) Labs Reviewed  BASIC METABOLIC PANEL -  Abnormal; Notable for the following components:      Result Value   Sodium 133 (*)    Glucose, Bld 134 (*)    All other components within normal limits  CBC - Abnormal; Notable for the following components:   WBC 12.0 (*)    Hemoglobin 10.5 (*)    HCT 35.2 (*)    MCHC 29.8 (*)    All other components within normal limits  D-DIMER, QUANTITATIVE (NOT AT [facility]) - Abnormal; Notable for the following components:   D-Dimer, Quant 1.89 (*)    All other components within normal limits  URINALYSIS, ROUTINE W REFLEX MICROSCOPIC - Abnormal; Notable for the following components:   APPearance HAZY (*)    Hgb urine dipstick SMALL (*)    Protein, ur 30 (*)    Nitrite POSITIVE (*)    Leukocytes, UA TRACE (*)    Bacteria, UA RARE (*)    All other components within normal limits  URINE CULTURE  I-STAT TROPONIN, ED    EKG EKG Interpretation  Date/Time:  Friday June 08 2018 23:06:53 EST Ventricular Rate:  83 PR Interval:    QRS Duration: 82 QT Interval:  352 QTC Calculation: 414 R Axis:   30 Text Interpretation:  Atrial fibrillation Low voltage, precordial leads When compared with ECG of 04/08/2018, Nonspecific T wave abnormality is no longer present Confirmed by Delora Fuel ([id number]) on 06/09/2018 12:26:07 AM   Radiology Dg Chest 2 View  Result Date: 06/09/2018 CLINICAL DATA:  Initial evaluation for acute dry cough for 3 days. EXAM: CHEST - 2 VIEW COMPARISON:  Prior radiograph from 05/17/2018 FINDINGS: Moderate cardiomegaly, stable. Mediastinal silhouette within normal limits. Aortic atherosclerosis. Lungs mildly hypoinflated. Mild diffuse pulmonary interstitial congestion without frank pulmonary edema. Small bilateral pleural effusions, right larger than left. Associated mild bibasilar atelectasis. No other consolidative opacity. No pneumothorax. No acute osseous finding.  Osteopenia noted. IMPRESSION: 1. Cardiomegaly with mild diffuse pulmonary interstitial congestion and small bilateral  pleural effusions, right greater than left. 2. Aortic atherosclerosis. Electronically Signed   By: Jeannine Boga M.D.   On: 06/09/2018 00:20   Ct Angio Chest Pe W And/or Wo Contrast  Result Date: 06/09/2018 CLINICAL DATA:  Positive D-dimer.  Right chest and back pain. EXAM: CT ANGIOGRAPHY CHEST WITH CONTRAST TECHNIQUE: Multidetector CT imaging of the chest was performed using the standard protocol during bolus administration of intravenous contrast. Multiplanar CT image reconstructions and MIPs were obtained to evaluate the vascular anatomy. CONTRAST:  164mL ISOVUE-370 IOPAMIDOL (ISOVUE-370) INJECTION 76% COMPARISON:  Chest x-ray 06/08/2018.  Chest CT 04/06/2018 FINDINGS: Cardiovascular: No filling defects in the pulmonary arteries to suggest pulmonary emboli. Cardiomegaly. Tortuous ectatic aorta with diffuse aortic atherosclerosis and diffuse coronary artery calcifications. Mediastinum/Nodes: No mediastinal, hilar, or axillary adenopathy. Lungs/Pleura: Small right pleural effusion. Linear areas of atelectasis in the lung bases. No confluent opacities. Upper Abdomen: Imaging into the upper abdomen shows no acute findings. Musculoskeletal: Chest wall soft tissues are unremarkable. No acute bony abnormality. Stable severe compression fracture at T12. Diffuse degenerative changes. Review of the MIP images confirms the above findings. IMPRESSION: No evidence of pulmonary embolus. Small right pleural effusion. Cardiomegaly. Tortuous, ectatic aorta. Diffuse coronary artery disease. Bibasilar atelectasis. Aortic Atherosclerosis (ICD10-I70.0).  T cyst Electronically Signed   By: Rolm Baptise M.D.   On: 06/09/2018 02:04    Procedures Procedures  Medications Ordered in ED Medications  sodium chloride flush (NS) 0.9 % injection 3 mL (has no administration in time range)  nitrofurantoin (macrocrystal-monohydrate) (MACROBID) capsule 100 mg (has no administration in time range)  ibuprofen (ADVIL,MOTRIN) tablet  400 mg (has no administration in time range)  acetaminophen (TYLENOL) tablet 650 mg (650 mg Oral Given 06/09/18 0032)  oxyCODONE (Oxy IR/ROXICODONE) immediate release tablet 10 mg (10 mg Oral Given 06/09/18 0113)  iopamidol (ISOVUE-370) 76 % injection 100 mL (100 mLs Intravenous Contrast Given 06/09/18 0136)     Initial Impression / Assessment and Plan / ED Course  I have reviewed the triage vital signs and the nursing notes.  Pertinent labs & imaging results that were available during my care of the patient were reviewed by me and considered in my medical decision making (see chart for details).  Pleuritic right-sided chest pain of uncertain cause.  Will check chest x-ray to rule out pneumonia.  With tenderness in the cause of this to vertebral angle area, need to evaluate for possible recurrent UTI.  Old records are reviewed, and she had been admitted to the hospital 2 months ago with encephalopathy secondary to UTI and also had some pleuritic chest pain at that time.  ECG shows atrial fibrillation with controlled rate, no acute changes.  MR has come back mildly elevated.  When hospitalized 2 months ago, she also had an elevated d-dimer and CT angiogram which showed no pulmonary embolism.  She is sent for CT angiogram today which also shows no evidence of pulmonary embolism, small right pleural effusion present.  This could be related to pleuritis.  However, urinalysis also shows evidence of infection with positive nitrite and 10-20 WBCs per high-power field.  Previous infection was with E. coli sensitive to nitrofurantoin, so she is given a dose of nitrofurantoin in the ED and sent home with prescription for same.  Also given prescription for naproxen and a low dose to treat possible pleuritis/pleurisy.  Given strict return precautions should she develop fever, vomiting, or altered mentation.  Otherwise, follow-up with PCP in 1 week.  Final Clinical Impressions(s) / ED Diagnoses   Final diagnoses:   Pleuritic chest pain  Urinary tract infection without hematuria, site unspecified  Normocytic anemia    ED Discharge Orders         Ordered  naproxen (NAPROSYN) 250 MG tablet  2 times daily with meals     06/09/18 0223    nitrofurantoin, macrocrystal-monohydrate, (MACROBID) 100 MG capsule  2 times daily     06/09/18 7209           Delora Fuel, MD 19/80/22 [phone]

## 2018-06-08 NOTE — Patient Outreach (Signed)
[place] [facility] [facility]) Care Management  06/08/2018  [name] 1933-10-12 [id number]    Follow up call to patient's son, [name], regarding home assessment that occurred with [facility] on 05/17/18.  BSW informed him that Johnn Hai from [place] is currently working to gather necessary medical records to proceed with request for ramp construction.  The entire process will take approximately 3-6 months to complete.  Mr. Carter Kitten reported that he mailed paperwork to [place] approximately one week ago.  [facility] is unable to provide timeline for process because it is based on availability of local volunteers.  BSW will follow up with [facility] and [facility] within the next month to determine status of referrals. If [facility] is able to locate volunteers prior to [place] completing their process, BSW will request that referral to [facility] be closed.  BSW has communicated this to Bell [place] at [facility].   Ronn Melena, BSW Social Worker [phone]

## 2018-06-09 ENCOUNTER — Emergency Department (HOSPITAL_COMMUNITY): Payer: Medicare Other

## 2018-06-09 DIAGNOSIS — R7989 Other specified abnormal findings of blood chemistry: Secondary | ICD-10-CM | POA: Diagnosis not present

## 2018-06-09 DIAGNOSIS — R0781 Pleurodynia: Secondary | ICD-10-CM | POA: Diagnosis not present

## 2018-06-09 DIAGNOSIS — I700 Atherosclerosis of aorta: Secondary | ICD-10-CM | POA: Diagnosis not present

## 2018-06-09 DIAGNOSIS — J90 Pleural effusion, not elsewhere classified: Secondary | ICD-10-CM | POA: Diagnosis not present

## 2018-06-09 LAB — I-STAT TROPONIN, ED: Troponin i, poc: 0 ng/mL (ref 0.00–0.08)

## 2018-06-09 LAB — BASIC METABOLIC PANEL
Anion gap: 9 (ref 5–15)
BUN: 23 mg/dL (ref 8–23)
CO2: 26 mmol/L (ref 22–32)
Calcium: 8.9 mg/dL (ref 8.9–10.3)
Chloride: 98 mmol/L (ref 98–111)
Creatinine, Ser: 0.87 mg/dL (ref 0.44–1.00)
GFR calc Af Amer: >60 mL/min (ref >60)
GFR calc non Af Amer: >60 mL/min (ref >60)
Glucose, Bld: 134 mg/dL — ABNORMAL HIGH (ref 70–99)
Potassium: 4.5 mmol/L (ref 3.5–5.1)
Sodium: 133 mmol/L — ABNORMAL LOW (ref 135–145)

## 2018-06-09 LAB — CBC
HCT: 35.2 % — ABNORMAL LOW (ref 36.0–46.0)
Hemoglobin: 10.5 g/dL — ABNORMAL LOW (ref 12.0–15.0)
MCH: 27.1 pg (ref 26.0–34.0)
MCHC: 29.8 g/dL — ABNORMAL LOW (ref 30.0–36.0)
MCV: 90.7 fL (ref 80.0–100.0)
Platelets: 266 10*3/uL (ref 150–400)
RBC: 3.88 MIL/uL (ref 3.87–5.11)
RDW: 15.3 % (ref 11.5–15.5)
WBC: 12 10*3/uL — ABNORMAL HIGH (ref 4.0–10.5)
nRBC: 0 % (ref 0.0–0.2)

## 2018-06-09 LAB — URINALYSIS, ROUTINE W REFLEX MICROSCOPIC
Bilirubin Urine: NEGATIVE
Glucose, UA: NEGATIVE mg/dL
Ketones, ur: NEGATIVE mg/dL
Nitrite: POSITIVE — AB
Protein, ur: 30 mg/dL — AB
Specific Gravity, Urine: 1.023 (ref 1.005–1.030)
pH: 5 (ref 5.0–8.0)

## 2018-06-09 LAB — D-DIMER, QUANTITATIVE: D-Dimer, Quant: 1.89 ug/mL-FEU — ABNORMAL HIGH (ref 0.00–0.50)

## 2018-06-09 MED ORDER — NITROFURANTOIN MONOHYD MACRO 100 MG PO CAPS: 100.0000 mg | ORAL_CAPSULE | Freq: Two times a day (BID) | ORAL | 0 refills | Status: DC

## 2018-06-09 MED ORDER — OXYCODONE HCL 5 MG PO TABS
10.0000 mg | ORAL_TABLET | Freq: Once | ORAL | Status: AC
  Administered 2018-06-09: 10 mg via ORAL
  Filled 2018-06-09: qty 2

## 2018-06-09 MED ORDER — NAPROXEN 250 MG PO TABS: 250.0000 mg | ORAL_TABLET | Freq: Two times a day (BID) | ORAL | 0 refills | Status: DC

## 2018-06-09 MED ORDER — NITROFURANTOIN MONOHYD MACRO 100 MG PO CAPS
100.0000 mg | ORAL_CAPSULE | Freq: Once | ORAL | Status: AC
  Administered 2018-06-09: 100 mg via ORAL
  Filled 2018-06-09: qty 1

## 2018-06-09 MED ORDER — IBUPROFEN 400 MG PO TABS
400.0000 mg | ORAL_TABLET | Freq: Once | ORAL | Status: AC
  Administered 2018-06-09: 400 mg via ORAL
  Filled 2018-06-09: qty 1

## 2018-06-09 MED ORDER — IOPAMIDOL (ISOVUE-370) INJECTION 76%
100.0000 mL | Freq: Once | INTRAVENOUS | Status: AC | PRN
  Administered 2018-06-09: 100 mL via INTRAVENOUS

## 2018-06-09 NOTE — Discharge Instructions (Addendum)
Return if you start running a fever, start vomiting, or become confused.  Culture report will be back on Monday, February 10. If it shows you need to be on a different antibiotic, we will contact you.

## 2018-06-11 LAB — URINE CULTURE: Culture: >=100000 — AB

## 2018-06-12 ENCOUNTER — Telehealth: Payer: Self-pay

## 2018-06-12 NOTE — Telephone Encounter (Signed)
Post ED Visit - Positive Culture Follow-up  Culture report reviewed by antimicrobial stewardship pharmacist:  []  Elenor Quinones, Pharm.D. []  Heide Guile, Pharm.D., BCPS AQ-ID []  Parks Neptune, Pharm.D., BCPS []  Alycia Rossetti, Pharm.D., BCPS []  [place], Pharm.D., BCPS, AAHIVP []  Legrand Como, Pharm.D., BCPS, AAHIVP []  Salome Arnt, PharmD, BCPS []  Johnnette Gourd, PharmD, BCPS []  Hughes Better, PharmD, BCPS []  Leeroy Cha, PharmD  Positive urine culture Treated with Microbid, organism sensitive to the same and no further patient follow-up is required at this time.  Genia Del 06/12/2018, 8:24 AM

## 2018-07-06 ENCOUNTER — Ambulatory Visit: Payer: Self-pay

## 2018-07-18 ENCOUNTER — Other Ambulatory Visit: Payer: Self-pay

## 2018-07-18 NOTE — Patient Outreach (Signed)
[facility]) Care Management  07/18/2018  ATONYA TEMPLER [birth year] [id number]   Outreach to Johnn Hai with [facility] regarding status of referral for ramp construction.   Per Anderson Malta, "we are still waiting on the engineer but that may be a while since we are not making visits out in the community at this time.".  Anderson Malta was unsure if requested bank statements have been received from patient, however, she reported having some unopened mail.  She agreed to check and follow up with BSW.  BSW will make follow up call to patient's son once update is received from [place].    Ronn Melena, BSW Social Worker [phone]

## 2018-07-19 NOTE — Patient Outreach (Signed)
Opened in error

## 2018-07-24 ENCOUNTER — Ambulatory Visit: Payer: Self-pay

## 2018-07-24 ENCOUNTER — Other Ambulatory Visit: Payer: Self-pay

## 2018-07-24 NOTE — Patient Outreach (Addendum)
[facility]) Care Management  07/24/2018  [name] [birth year] [id number]  Unsuccessful outreach to patient's son regarding status of referrals to [facility], [facility], and [facility] for home modifications.  BSW left voicemail message.  Will attempt to reach again within four business days.   Addendum: Return call from patient's son.  BSW informed him that the process for requested home modifications has potential to be significantly delayed due to COVID-19.  BSW informed him that Johnn Hai from [facility] is still in need of three months of bank statements in order to process referral.  BSW provided him with mailing address and he agreed to submit them.   BSW will follow up again within the next two months.   Ronn Melena, BSW Social Worker [phone]

## 2018-07-26 ENCOUNTER — Ambulatory Visit: Payer: Self-pay

## 2018-08-07 ENCOUNTER — Ambulatory Visit: Payer: Medicare Other | Admitting: Podiatry

## 2018-08-17 ENCOUNTER — Ambulatory Visit (INDEPENDENT_AMBULATORY_CARE_PROVIDER_SITE_OTHER): Payer: Medicare Other | Admitting: Podiatry

## 2018-08-17 ENCOUNTER — Other Ambulatory Visit: Payer: Self-pay

## 2018-08-17 DIAGNOSIS — G8929 Other chronic pain: Secondary | ICD-10-CM | POA: Diagnosis not present

## 2018-08-17 DIAGNOSIS — I872 Venous insufficiency (chronic) (peripheral): Secondary | ICD-10-CM

## 2018-08-17 DIAGNOSIS — M79676 Pain in unspecified toe(s): Secondary | ICD-10-CM

## 2018-08-17 DIAGNOSIS — E1142 Type 2 diabetes mellitus with diabetic polyneuropathy: Secondary | ICD-10-CM | POA: Diagnosis not present

## 2018-08-17 DIAGNOSIS — B351 Tinea unguium: Secondary | ICD-10-CM

## 2018-08-17 DIAGNOSIS — M25571 Pain in right ankle and joints of right foot: Secondary | ICD-10-CM

## 2018-08-17 MED ORDER — AMMONIUM LACTATE 12 % EX CREA: TOPICAL_CREAM | CUTANEOUS | 0 refills | Status: DC | PRN

## 2018-08-17 MED ORDER — DICLOFENAC SODIUM 1 % TD GEL: 2.0000 g | Freq: Four times a day (QID) | TRANSDERMAL | 2 refills | Status: DC

## 2018-08-21 NOTE — Progress Notes (Addendum)
Subjective: 85 y.o. returns the office today for painful, elongated, thickened toenails which she cannot trim herself. Denies any redness or drainage around the nails.  Overall she has no acute changes.  She is had some chronic swelling and discoloration to her toes and feet but this is not a new issue for her.  She denies any recent injury or falls.  She does report some ankle pain as well which is again been a chronic issue.  The pain is intermittent.  No significant increase in swelling.  Denies any acute changes since last appointment and no new complaints today. Denies any systemic complaints such as fevers, chills, nausea, vomiting.   PCP: [name], MD  Objective: NAD- presents in wheelchair DP/PT pulses palpable, CRT less than 3 seconds.  Varicose veins are present most chronic bilateral lower extremity edema.  No significant discoloration of the digits identified today. Nails hypertrophic, dystrophic, elongated, brittle, discolored 8. There is tenderness overlying the nails 1-5 bilaterally except for the right second and third toes which is been amputated. There is no surrounding erythema or drainage along the nail sites. She does report some right lateral ankle pain.  Not able to identify any area of pinpoint tenderness.  Minimal edema to this area is no erythema or warmth. No open lesions or pre-ulcerative lesions are identified. No other areas of tenderness bilateral lower extremities. No overlying edema, erythema, increased warmth. No pain with calf compression, swelling, warmth, erythema.  Assessment: Patient presents with symptomatic onychomycosis, venous insufficiency, chronic bilateral ankle pain  Plan: -Treatment options including alternatives, risks, complications were discussed -Nails sharply debrided 8without complication/bleeding. -Discussed daily foot inspection. If there are any changes, to call the office immediately.  -There is elevation of swelling to her  legs.  She reports intermittent discoloration and pain is coming from the varicose veins in the venous insufficiency. -Prescribed Voltaren gel to the right lateral ankle to use as needed. Wanted to hold off on x-ray. -Follow-up in 3 months with Dr. Milinda Pointer or sooner if any problems are to arise. In the meantime, encouraged to call the office with any questions, concerns, changes symptoms.  Celesta Gentile, DPM

## 2018-09-03 ENCOUNTER — Telehealth: Payer: Self-pay | Admitting: *Deleted

## 2018-09-03 NOTE — Telephone Encounter (Signed)
09/03/18 LMOM to call and schedule follow up appointment @ 0928. 

## 2018-09-04 ENCOUNTER — Other Ambulatory Visit: Payer: Self-pay

## 2018-09-04 NOTE — Patient Outreach (Signed)
[facility]) Care Management  09/04/2018  [name] 01-24-1934 [id number]   Engineer with [facility] visited patient's home last week regarding request for assistance with ramp construction.  Per Johnn Hai with [facility], ramp has already been constructed.  BSW contacted [facility] to inform them that referral can be closed. BSW closing case at this time.  Ronn Melena, BSW Social Worker [phone]

## 2018-09-14 ENCOUNTER — Other Ambulatory Visit: Payer: Self-pay | Admitting: Family Medicine

## 2018-09-21 ENCOUNTER — Ambulatory Visit: Payer: Medicare Other

## 2018-11-15 ENCOUNTER — Ambulatory Visit: Payer: Medicare Other | Admitting: Podiatry

## 2018-12-06 ENCOUNTER — Other Ambulatory Visit: Payer: Self-pay

## 2018-12-06 ENCOUNTER — Ambulatory Visit (INDEPENDENT_AMBULATORY_CARE_PROVIDER_SITE_OTHER): Payer: Medicare Other | Admitting: Podiatry

## 2018-12-06 ENCOUNTER — Encounter: Payer: Self-pay | Admitting: Podiatry

## 2018-12-06 VITALS — Temp 97.2°F

## 2018-12-06 DIAGNOSIS — E1142 Type 2 diabetes mellitus with diabetic polyneuropathy: Secondary | ICD-10-CM | POA: Diagnosis not present

## 2018-12-06 DIAGNOSIS — M79676 Pain in unspecified toe(s): Secondary | ICD-10-CM

## 2018-12-06 DIAGNOSIS — B351 Tinea unguium: Secondary | ICD-10-CM | POA: Diagnosis not present

## 2018-12-06 NOTE — Progress Notes (Signed)
Toenails are long thick yellow dystrophic and painful.  Like to have been cut.  She states that they are bothering her severely.  Objective: Pulses remain palpable bilateral no open lesions or wounds to diabetic foot bilateral.  Severe neuropathy bilateral.  Toenails are thick yellow dystrophic clinically mycotic and painful.  Assessment: Pain limb secondary to onychomycosis.  The pain Secondary to diabetic peripheral neuropathy.  Plan: Debridement of toenails 1 through 5 bilateral.

## 2018-12-12 ENCOUNTER — Other Ambulatory Visit: Payer: Self-pay

## 2018-12-12 ENCOUNTER — Encounter: Payer: Self-pay | Admitting: Family Medicine

## 2018-12-12 ENCOUNTER — Ambulatory Visit (INDEPENDENT_AMBULATORY_CARE_PROVIDER_SITE_OTHER): Payer: Medicare Other | Admitting: Family Medicine

## 2018-12-12 VITALS — BP 120/76 | HR 88 | Temp 99.0°F | Resp 20 | Ht 60.0 in

## 2018-12-12 DIAGNOSIS — G253 Myoclonus: Secondary | ICD-10-CM | POA: Diagnosis not present

## 2018-12-12 DIAGNOSIS — N390 Urinary tract infection, site not specified: Secondary | ICD-10-CM | POA: Diagnosis not present

## 2018-12-12 DIAGNOSIS — B9620 Unspecified Escherichia coli [E. coli] as the cause of diseases classified elsewhere: Secondary | ICD-10-CM | POA: Diagnosis not present

## 2018-12-12 LAB — URINALYSIS, ROUTINE W REFLEX MICROSCOPIC
Bilirubin Urine: NEGATIVE
Glucose, UA: NEGATIVE
Hyaline Cast: NONE SEEN /LPF
Ketones, ur: NEGATIVE
Leukocytes,Ua: NEGATIVE
Nitrite: POSITIVE — AB
Protein, ur: NEGATIVE
RBC / HPF: NONE SEEN /HPF (ref 0–2)
Specific Gravity, Urine: 1.025 (ref 1.001–1.03)
pH: 5 (ref 5.0–8.0)

## 2018-12-12 LAB — MICROSCOPIC MESSAGE

## 2018-12-12 NOTE — Progress Notes (Signed)
Subjective:    Patient ID: [name], female    DOB: [birth year], 85 y.o.   MRN: [id number]  HPI 05/17/18 Tuesday, the patient developed pain under her left rib and in her left flank.  She got up and went to the bathroom and after going to the bathroom the pain resolved spontaneously.  It only lasted for a few minutes.  It was a sharp pain.  She denies any chest pain.  She denies any substernal chest pressure.  However she has been coughing a lot recently.  Cough is productive of yellow clear sputum.  Son denies any fever.  She is afebrile today.  However on exam today she is very tachycardic.  I calculated her pulse by palpation to be over 120 bpm.  She is irregularly irregular and in atrial fibrillation despite taking atenolol 50 mg a day.  On pulmonary exam, she has bibasilar crackles on both sides to the mid lung fields bilaterally.  Question is this pulmonary edema due to her rapid heart rate or possibly some type of pneumonia particular given the chest pain she was having earlier this week.  Patient does not appear clinically ill and is in no distress with no increased work of breathing.  At that time, my plan was: I am not certain of the source of her chest pain but it does not sound cardiac in nature.  It could have been muscular however it has resolved and has not returned.  However her exam is difficult for tachycardia due to atrial fibrillation as well as bilateral crackles that could be signs of pulmonary edema although there is no evidence of pitting edema in her extremities.  Therefore I will add Cardizem CD 180 mg p.o. daily to her atenolol to better control her heart rate which I calculated to be in the 120s on palpation.  I will also send the patient for chest x-ray to determine if this is bilateral pulmonary edema versus atelectasis versus infiltrate.  If there is evidence of edema, she will need mild diuresis and then recheck next week.  If there is an infiltrate, I will start the  patient on antibiotics and recheck next week.  05/21/18  Patient is here today for recheck.  Despite adding Cardizem, her heart rate is still 110 bpm when I check her pulse today manually.  She also continues to have bibasilar crackles and congestion.  There is also trace to +1 bipedal edema to the midshin.  I am concerned that her atrial fibrillation being out of control is leading to the accumulation of edema.  Her son does report that she has been coughing more.  She denies any chest pain.  She denies any fever.  She denies any pleurisy.  At that time, my plan was: Increase atenolol to 100 mg a day.  Continue Cardizem at his current dose.  Add Lasix 20 mg a day.  Recheck heart rate every day at home and notify me of the values.  Goal heart rates between 60 and 90 bpm.  Recheck patient clinically next week and obtain BMP at that time.  05/28/18 Patient is here today for follow-up.  Heart rate has improved dramatically since increasing atenolol to 100 mg a day and adding Cardizem.  Today she is 70 to 80 bpm although she is still in atrial fibrillation.  She denies any shortness of breath.  She denies any chest pain.  She does still cough however the cough is nonproductive.  Pulmonary exam today  is much improved and the bibasilar crackles have improved and decreased.  There is no pitting edema in her extremities.  Lungs are much clear on examination.  She has over the last 24 hours developed some diarrhea.  Her son is sick with a stomach virus and also has diarrhea. AT that time, my plan was: Patient's exam has improved.  I have recommended staying on the Cardizem as well as the higher dose of atenolol.  However I recommended that we discontinue furosemide to avoid dehydration particular given her diarrhea and use just on an as-needed basis.  She can use Imodium as needed for diarrhea   12/12/18 Patient presents today with her son.  She is confined to a wheelchair.  He states that his mother is "shaky".   At rest, the patient's hands are slowly twitching side to side.  This is a low-frequency tremor.  It is also very gross tremor with a high amplitude of movement.  Hands are shaking 2 to 3 inches side to side.  It is also very irregular and jerky.  With the arms extended against gravity, the shaking worsens.  The tremor involves the hands and wrists of both arms.  It also seems to involve her legs.  Occasionally her foot begins to shake side to side again with low frequency and high amplitude.  The patient's son states that she starts to shake like this whenever she gets a bladder infection.  I believe he is describing rigors from previous infections.  What I am clinically seeing today does not appear to be rigors but instead resembles myoclonic jerks that are occurring repeatedly.  However the patient and the son deny any seizure history.  She denies any loss of consciousness.  Furthermore this tends to occur frequently throughout the day and seems to frequent to be some type of partial seizure activity.  She denies any dysuria.  She denies any abdominal pain.  She denies any nausea or vomiting.  She denies any cough.  She denies any shortness of breath.  She denies any headache.  There are no other neurologic deficits on her physical exam.  Past Medical History:  Diagnosis Date   [name] (abdominal aortic aneurysm) ([place]) 09/2013   3.6 cm   Acute and chronic respiratory failure with hypoxia (HCC)    Acute delirium    Acute encephalopathy    Acute pyelonephritis    Atrial fibrillation (HCC)    Chronic pain syndrome    Constipation    Dementia (HCC)    Dependent edema    bilateral legs    Depression    Diabetes mellitus    pre-diabetes   Diverticulosis    Diverticulosis    Dysuria    E coli bacteremia    Fall    Gross hematuria    Hydronephrosis with renal and ureteral calculous obstruction    Hyperlipidemia    Hypertension    Hypoxemia    Idiopathic peripheral  autonomic neuropathy    Infestation by bed bug    Nephrolithiasis    OSA on CPAP    Osteoarthritis    Osteopenia    T=  -2.1 in hip   Persistent mood (affective) disorder, unspecified (HCC)    Prediabetes    Rheumatoid arthritis (HCC)    Rheumatoid arthritis(714.0)    S/P carpal tunnel release    Sepsis secondary to UTI [facility])    Spinal stenosis    Unspecified fracture of fifth lumbar vertebra, initial encounter for closed fracture ([place])  Unspecified fracture of t11-T12 vertebra, initial encounter for closed fracture [facility])    Urinary incontinence    Urinary tract infection    hx of    Urolithiasis    Past Surgical History:  Procedure Laterality Date   APPENDECTOMY     CARPAL TUNNEL RELEASE  12/27/2006   Right subcutaneous ulnar nerve transfer -- Right open carpal tunnel release   CHOLECYSTECTOMY     CYSTOSCOPY W/ URETERAL STENT PLACEMENT Right 01/30/2018   Procedure: CYSTOSCOPY WITH STENT REPLACEMENT retrograde pylegram;  Surgeon: Ardis Hughs, MD;  Location: [facility];  Service: Urology;  Laterality: Right;   CYSTOSCOPY WITH URETEROSCOPY AND STENT PLACEMENT Right 03/01/2018   Procedure: CYSTOSCPY/RIGHT URETEROSCOPY HOLMIUM LASER LITHOTRIPSY AND STENT EXCHANGE;  Surgeon: Ardis Hughs, MD;  Location: [facility];  Service: Urology;  Laterality: Right;   FOOT SURGERY     HOLMIUM LASER APPLICATION Right 33/54/5625   Procedure: HOLMIUM LASER APPLICATION;  Surgeon: Ardis Hughs, MD;  Location: [facility];  Service: Urology;  Laterality: Right;   JOINT REPLACEMENT     BTKR, RTHR   TOTAL KNEE ARTHROPLASTY  08/21/2007   Right total knee replacement   Current Outpatient Medications on File Prior to Visit  Medication Sig Dispense Refill   acetaminophen (TYLENOL) 500 MG tablet Take 500 mg by mouth every 8 (eight) hours as needed for mild pain.      ammonium lactate (AMLACTIN) 12 % cream Apply topically as needed for dry skin. 385 g 0   aspirin EC  81 MG tablet Take 81 mg by mouth daily.     atenolol (TENORMIN) 50 MG tablet Take 1 tablet (50 mg total) by mouth daily. 90 tablet 3   Cholecalciferol (VITAMIN D3) 2000 units TABS Take 2,000 Units by mouth daily.     diclofenac sodium (VOLTAREN) 1 % GEL Apply 2 g topically 4 (four) times daily. Rub into affected area of foot 2 to 4 times daily 100 g 2   diltiazem (CARDIZEM CD) 180 MG 24 hr capsule TAKE 1 CAPSULE BY MOUTH EVERY DAY 30 capsule 0   DULoxetine (CYMBALTA) 30 MG capsule Take 90 mg by mouth daily.   2   furosemide (LASIX) 20 MG tablet TAKE 1 TABLET BY MOUTH EVERY DAY 90 tablet 1   gabapentin (NEURONTIN) 800 MG tablet Take 800-1,600 mg by mouth as directed. 800 mg in the morning, 800 mg at noon, 1600 mg at night     naproxen (NAPROSYN) 250 MG tablet Take 1 tablet (250 mg total) by mouth 2 (two) times daily with a meal. 15 tablet 0   nitrofurantoin, macrocrystal-monohydrate, (MACROBID) 100 MG capsule Take 1 capsule (100 mg total) by mouth 2 (two) times daily. X 7 days 14 capsule 0   oxyCODONE-acetaminophen (PERCOCET) 7.5-325 MG tablet Take 1 tablet by mouth every 8 (eight) hours as needed for severe pain. 90 tablet 0   pantoprazole (PROTONIX) 40 MG tablet Take 1 tablet (40 mg total) by mouth daily. 90 tablet 3   rosuvastatin (CRESTOR) 40 MG tablet Take 1 tablet (40 mg total) by mouth every evening. 90 tablet 3   No current facility-administered medications on file prior to visit.    Allergies  Allergen Reactions   Sulfonamide Derivatives Hives and Rash   Social History   Socioeconomic History   Marital status: Divorced    Spouse name: Not on file   Number of children: 6   Years of education: Not on file   Highest education level: Not  on file  Occupational History   Not on file  Social Needs   Financial resource strain: Not on file   Food insecurity    Worry: Not on file    Inability: Not on file   Transportation needs    Medical: Not on file     Non-medical: Not on file  Tobacco Use   Smoking status: Never Smoker   Smokeless tobacco: Never Used  Substance and Sexual Activity   Alcohol use: No   Drug use: No   Sexual activity: Not on file  Lifestyle   Physical activity    Days per week: Not on file    Minutes per session: Not on file   Stress: Not on file  Relationships   Social connections    Talks on phone: Not on file    Gets together: Not on file    Attends religious service: Not on file    Active member of club or organization: Not on file    Attends meetings of clubs or organizations: Not on file    Relationship status: Not on file   Intimate partner violence    Fear of current or ex partner: Not on file    Emotionally abused: Not on file    Physically abused: Not on file    Forced sexual activity: Not on file  Other Topics Concern   Not on file  Social History Narrative   Not on file    Review of Systems  All other systems reviewed and are negative.      Objective:   Physical Exam Vitals signs reviewed.  Constitutional:      General: She is not in acute distress.    Appearance: She is well-developed. She is not diaphoretic.  Cardiovascular:     Rate and Rhythm: Rhythm irregular.     Heart sounds: No murmur. No friction rub. No gallop.   Pulmonary:     Effort: Pulmonary effort is normal. No respiratory distress.     Breath sounds: No stridor or decreased air movement. No rales. No decreased breath sounds, wheezing.  Patient does have bilateral fine crackles in both lungs.  This was a chronic finding present in January. Chest:     Chest wall: No tenderness.  Abdominal:     General: Bowel sounds are normal. There is no distension.     Palpations: Abdomen is soft. There is no mass.     Tenderness: There is no abdominal tenderness. There is no guarding or rebound.     Hernia: No hernia is present.  Musculoskeletal:     Right lower leg: No edema.     Left lower leg: No edema.   Patient  has a tremor present in both arms as well as occasionally in her right leg more than her left leg.  It is a low frequency high amplitude tremor.  It is very jerky in nature and irregular.        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 To me the patient's shaking appears to be more than just an essential tremor.  The movement seems to jerky and gross to be an essential tremor.  I question if the patient is having myoclonic jerks.  However they are also repetitive and frequent which would be unlike myoclonus.  Therefore the differential diagnosis includes essential tremor exacerbated by underlying cause versus myoclonic jerks versus atypical partial seizures.  Given the son's history, I will check a urinalysis.  I will also  check baseline labs.  If this is primary progressive myoclonus, it may in fact represent some underlying neurodegenerative disease.  Patient had an MRI of the brain in March 2019.  Results are listed below:  IMPRESSION: 1. No acute finding. 2. Extensive chronic small vessel ischemia in the cerebral white matter. Remote lacunar infarct in the right pons. 3. Generalized atrophy.  Urinalysis today shows +1 nitrites but otherwise there is only some trace blood.  While this could represent a mild urinary tract infection I do not believe this is the cause of the patient's myoclonus.  I have recommended a neurology consult as I believe the patient may benefit from an EEG. Patient son also reports hearing loss.  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  The left auditory canal is completely occluded with wax.  This was removed with irrigation and lavage.

## 2018-12-13 LAB — COMPLETE METABOLIC PANEL WITH GFR
AG Ratio: 1.1 (calc) (ref 1.0–2.5)
ALT: 10 U/L (ref 6–29)
AST: 19 U/L (ref 10–35)
Albumin: 3.8 g/dL (ref 3.6–5.1)
Alkaline phosphatase (APISO): 62 U/L (ref 37–153)
BUN/Creatinine Ratio: 20 (calc) (ref 6–22)
BUN: 19 mg/dL (ref 7–25)
CO2: 30 mmol/L (ref 20–32)
Calcium: 10.1 mg/dL (ref 8.6–10.4)
Chloride: 98 mmol/L (ref 98–110)
Creat: 0.93 mg/dL — ABNORMAL HIGH (ref 0.60–0.88)
GFR, Est African American: 66 mL/min/{1.73_m2} (ref >=60)
GFR, Est Non African American: 57 mL/min/{1.73_m2} — ABNORMAL LOW (ref >=60)
Globulin: 3.4 g/dL (calc) (ref 1.9–3.7)
Glucose, Bld: 96 mg/dL (ref 65–99)
Potassium: 5.2 mmol/L (ref 3.5–5.3)
Sodium: 139 mmol/L (ref 135–146)
Total Bilirubin: 0.6 mg/dL (ref 0.2–1.2)
Total Protein: 7.2 g/dL (ref 6.1–8.1)

## 2018-12-13 LAB — CBC WITH DIFFERENTIAL/PLATELET
Absolute Monocytes: 555 cells/uL (ref 200–950)
Basophils Absolute: 96 cells/uL (ref 0–200)
Basophils Relative: 1.3 %
Eosinophils Absolute: 163 cells/uL (ref 15–500)
Eosinophils Relative: 2.2 %
HCT: 41 % (ref 35.0–45.0)
Hemoglobin: 13.1 g/dL (ref 11.7–15.5)
Lymphs Abs: 2930 cells/uL (ref 850–3900)
MCH: 28.7 pg (ref 27.0–33.0)
MCHC: 32 g/dL (ref 32.0–36.0)
MCV: 89.7 fL (ref 80.0–100.0)
MPV: 10.1 fL (ref 7.5–12.5)
Monocytes Relative: 7.5 %
Neutro Abs: 3656 cells/uL (ref 1500–7800)
Neutrophils Relative %: 49.4 %
Platelets: 240 10*3/uL (ref 140–400)
RBC: 4.57 10*6/uL (ref 3.80–5.10)
RDW: 12.6 % (ref 11.0–15.0)
Total Lymphocyte: 39.6 %
WBC: 7.4 10*3/uL (ref 3.8–10.8)

## 2018-12-14 LAB — URINE CULTURE
MICRO NUMBER:: 763979
SPECIMEN QUALITY:: ADEQUATE

## 2018-12-17 ENCOUNTER — Other Ambulatory Visit: Payer: Self-pay | Admitting: Family Medicine

## 2018-12-17 MED ORDER — CIPROFLOXACIN HCL 500 MG PO TABS: 500.0000 mg | ORAL_TABLET | Freq: Two times a day (BID) | ORAL | 0 refills | Status: DC

## 2019-01-02 ENCOUNTER — Other Ambulatory Visit: Payer: Self-pay | Admitting: Family Medicine

## 2019-01-02 DIAGNOSIS — I6381 Other cerebral infarction due to occlusion or stenosis of small artery: Secondary | ICD-10-CM

## 2019-01-02 MED ORDER — ROSUVASTATIN CALCIUM 40 MG PO TABS: 40.0000 mg | ORAL_TABLET | Freq: Every evening | ORAL | 3 refills | Status: DC

## 2019-01-24 ENCOUNTER — Ambulatory Visit (INDEPENDENT_AMBULATORY_CARE_PROVIDER_SITE_OTHER): Payer: Medicare Other | Admitting: Neurology

## 2019-01-24 ENCOUNTER — Encounter: Payer: Self-pay | Admitting: Neurology

## 2019-01-24 ENCOUNTER — Other Ambulatory Visit: Payer: Self-pay

## 2019-01-24 ENCOUNTER — Telehealth: Payer: Self-pay | Admitting: Neurology

## 2019-01-24 VITALS — BP 121/91 | HR 61 | Temp 97.7°F

## 2019-01-24 DIAGNOSIS — R413 Other amnesia: Secondary | ICD-10-CM | POA: Insufficient documentation

## 2019-01-24 DIAGNOSIS — R269 Unspecified abnormalities of gait and mobility: Secondary | ICD-10-CM | POA: Diagnosis not present

## 2019-01-24 DIAGNOSIS — R251 Tremor, unspecified: Secondary | ICD-10-CM

## 2019-01-24 NOTE — Telephone Encounter (Signed)
Medicare/medicaid order sent to GI. No auth they will reach out to the patient.

## 2019-01-24 NOTE — Progress Notes (Signed)
PATIENT: Summerrose Godkin DOB: 12-14-1933  Chief Complaint  Patient presents with  . Myoclonus    She is here with her son, [name].  Reports intermittent, uncontrollable jerking in her upper and lower extremities.  She notices it mostly in her hands.  Marland Kitchen PCP    [name], MD     HISTORICAL  Tenaja Ohle Sobh is a 85 year old female, seen in request by her primary care physician Dr. Jenna Luo for evaluation of bilateral hands tremor, initial evaluation was on January 24, 2019.  She is accompanied by her son Audelia Acton at today's clinic visit.  I have reviewed and summarized the referring note from the referring physician.  She had a past medical history of hypertension, hyperlipidemia, depression anxiety, rheumatoid arthritis,, multiple joint pain, bilateral shoulder pain, gradual onset memory loss.  Atrial fibrillation, not on anti-coagulation, only on aspirin 81 mg daily.   She was admitted to hospital in December 2019, was treated for UTI, she was noted to have increased confusion, was discharged to rehabilitation following hospital admission, she was noted to have steady decline since then.  Now she lives at home with her sons, spent most of the time in her wheelchair, gait abnormality, complains of significant bilateral shoulder pain, multiple joints pain, she was noted to have gradual onset bilateral hands tremor, most noticeable in her dominant right hand, difficulty holding utensils.  There was no family history of tremor, she continue with gradual onset memory loss, today's Mini-Mental Status Examination 22/30   Laboratory evaluation in August 2020, CMP showed creatinine of 0.93, normal CBC,  REVIEW OF SYSTEMS: Full 14 system review of systems performed and notable only for as above All other review of systems were negative.  ALLERGIES: Allergies  Allergen Reactions  . Sulfonamide Derivatives Hives and Rash    HOME MEDICATIONS: Current Outpatient  Medications  Medication Sig Dispense Refill  . acetaminophen (TYLENOL) 500 MG tablet Take 500 mg by mouth every 8 (eight) hours as needed for mild pain.     Marland Kitchen ammonium lactate (AMLACTIN) 12 % cream Apply topically as needed for dry skin. 385 g 0  . aspirin EC 81 MG tablet Take 81 mg by mouth daily.    Marland Kitchen atenolol (TENORMIN) 50 MG tablet Take 1 tablet (50 mg total) by mouth daily. 90 tablet 3  . Calcium Citrate-Vitamin D (CALCIUM + D PO) Take 1 tablet by mouth daily. 600/400    . diclofenac sodium (VOLTAREN) 1 % GEL Apply 2 g topically 4 (four) times daily. Rub into affected area of foot 2 to 4 times daily 100 g 2  . DULoxetine (CYMBALTA) 30 MG capsule Take 30 mg by mouth daily. Takes along with 60mg  for total daily dose of 90mg .  2  . DULoxetine (CYMBALTA) 60 MG capsule Take 60 mg by mouth daily. Take along with 30mg  for a total daily dose of 90mg .    . gabapentin (NEURONTIN) 800 MG tablet Take 800-1,600 mg by mouth as directed. 800 mg in the morning, 800 mg at noon, 1600 mg at night    . oxyCODONE-acetaminophen (PERCOCET) 7.5-325 MG tablet Take 1 tablet by mouth every 8 (eight) hours as needed for severe pain. 90 tablet 0  . pantoprazole (PROTONIX) 40 MG tablet Take 1 tablet (40 mg total) by mouth daily. 90 tablet 3  . rosuvastatin (CRESTOR) 40 MG tablet Take 1 tablet (40 mg total) by mouth every evening. 90 tablet 3   No current facility-administered medications for  this visit.     PAST MEDICAL HISTORY: Past Medical History:  Diagnosis Date  . [name] (abdominal aortic aneurysm) ([place]) 09/2013   3.6 cm  . Acute and chronic respiratory failure with hypoxia ([place])   . Acute delirium   . Acute encephalopathy   . Acute pyelonephritis   . Atrial fibrillation ([place])   . Chronic pain syndrome   . Constipation   . Dementia ([place])   . Dependent edema    bilateral legs   . Depression   . Diabetes mellitus    pre-diabetes  . Diverticulosis   . Diverticulosis   . Dysuria   . E coli bacteremia   .  Fall   . Gross hematuria   . Hydronephrosis with renal and ureteral calculous obstruction   . Hyperlipidemia   . Hypertension   . Hypoxemia   . Idiopathic peripheral autonomic neuropathy   . Infestation by bed bug   . Nephrolithiasis   . OSA on CPAP   . Osteoarthritis   . Osteopenia    T=  -2.1 in hip  . Persistent mood (affective) disorder, unspecified ([place])   . Prediabetes   . Rheumatoid arthritis ([place])   . Rheumatoid arthritis(714.0)   . S/P carpal tunnel release   . Sepsis secondary to UTI ([place])   . Spinal stenosis   . Unspecified fracture of fifth lumbar vertebra, initial encounter for closed fracture ([place])   . Unspecified fracture of t11-T12 vertebra, initial encounter for closed fracture ([place])   . Urinary incontinence   . Urinary tract infection    hx of   . Urolithiasis     PAST SURGICAL HISTORY: Past Surgical History:  Procedure Laterality Date  . APPENDECTOMY    . CARPAL TUNNEL RELEASE  12/27/2006   Right subcutaneous ulnar nerve transfer -- Right open carpal tunnel release  . CHOLECYSTECTOMY    . CYSTOSCOPY W/ URETERAL STENT PLACEMENT Right 01/30/2018   Procedure: CYSTOSCOPY WITH STENT REPLACEMENT retrograde pylegram;  Surgeon: Ardis Hughs, MD;  Location: [facility];  Service: Urology;  Laterality: Right;  . CYSTOSCOPY WITH URETEROSCOPY AND STENT PLACEMENT Right 03/01/2018   Procedure: CYSTOSCPY/RIGHT URETEROSCOPY HOLMIUM LASER LITHOTRIPSY AND STENT EXCHANGE;  Surgeon: Ardis Hughs, MD;  Location: [facility];  Service: Urology;  Laterality: Right;  . FOOT SURGERY    . HOLMIUM LASER APPLICATION Right [id number]   Procedure: HOLMIUM LASER APPLICATION;  Surgeon: Ardis Hughs, MD;  Location: [facility];  Service: Urology;  Laterality: Right;  . JOINT REPLACEMENT     BTKR, RTHR  . TOTAL KNEE ARTHROPLASTY  08/21/2007   Right total knee replacement    FAMILY HISTORY: Family History  Problem Relation Age of Onset  . Stroke Father   . Coronary artery  disease Son        CABG at age 84  . Heart disease Son   . Heart attack Son   . Stroke Mother   . Hypertension Sister   . Diabetes Brother   . Hypertension Maternal Aunt     SOCIAL HISTORY: Social History   Socioeconomic History  . Marital status: Divorced    Spouse name: Not on file  . Number of children: 6  . Years of education: 8th grade  . Highest education level: Not on file  Occupational History  . Occupation: Retired  Scientific laboratory technician  . Financial resource strain: Not on file  . Food insecurity    Worry: Not on file    Inability: Not on file  .  Transportation needs    Medical: Not on file    Non-medical: Not on file  Tobacco Use  . Smoking status: Never Smoker  . Smokeless tobacco: Never Used  Substance and Sexual Activity  . Alcohol use: No  . Drug use: No  . Sexual activity: Not on file  Lifestyle  . Physical activity    Days per week: Not on file    Minutes per session: Not on file  . Stress: Not on file  Relationships  . Social Herbalist on phone: Not on file    Gets together: Not on file    Attends religious service: Not on file    Active member of club or organization: Not on file    Attends meetings of clubs or organizations: Not on file    Relationship status: Not on file  . Intimate partner violence    Fear of current or ex partner: Not on file    Emotionally abused: Not on file    Physically abused: Not on file    Forced sexual activity: Not on file  Other Topics Concern  . Not on file  Social History Narrative   Lives at home with her sons.   She has four living children.   Right-handed.   Two cups caffeine per day.     PHYSICAL EXAM   Vitals:   01/24/19 0803  BP: (!) 121/91  Pulse: 61  Temp: 97.7 F (36.5 C)    Not recorded      There is no height or weight on file to calculate BMI.  PHYSICAL EXAMNIATION:  Gen: NAD, conversant, well nourised, well groomed                     Cardiovascular: Regular rate  rhythm, no peripheral edema, warm, nontender. Eyes: Conjunctivae clear without exudates or hemorrhage Neck: Supple, no carotid bruits. Pulmonary: Clear to auscultation bilaterally   NEUROLOGICAL EXAM:  MENTAL STATUS:  MMSE - Mini Mental State Exam 01/24/2019  Orientation to time 4  Orientation to Place 4  Registration 3  Attention/ Calculation 3  Recall 1  Language- name 2 objects 2  Language- repeat 1  Language- follow 3 step command 3  Language- read & follow direction 1  Write a sentence 0  Copy design 0  Total score 22     CRANIAL NERVES: CN II: Visual fields are full to confrontation.  Pupils are round equal and briskly reactive to light. CN III, IV, VI: extraocular movement are normal. No ptosis. CN V: Facial sensation is intact to pinprick in all 3 divisions bilaterally. Corneal responses are intact.  CN VII: Face is symmetric with normal eye closure and smile. CN VIII: Hearing is normal to causal conversation. CN IX, X: Palate elevates symmetrically. Phonation is normal. CN XI: Head turning and shoulder shrug are intact CN XII: Tongue is midline with normal movements and no atrophy.  MOTOR: Deformity of multiple joints of bilateral hands, motor examination is limited by bilateral shoulder pain, joint pain, felt there was no significant distal muscle weakness or lower extremity muscles, she has mild bilateral hand posturing tremor   REFLEXES: Reflexes are hypoactive and symmetric at the biceps, triceps, knees, and ankles. Plantar responses are flexor.  SENSORY: Length dependent decreased to light touch, pinprick, and vibratory sensation to knee level  COORDINATION: There was no trunk, dysmetria noted   GAIT/STANCE: She needs push-up to get up from seated position, unsteady, wide-based  DIAGNOSTIC DATA (LABS, IMAGING, TESTING) - I reviewed patient records, labs, notes, testing and imaging myself where available.   ASSESSMENT AND PLAN  Yanci Wellbrock is  a 85 y.o. female   Bilateral hands tremor  Most consistent with essential tremor,  Laboratory evaluations  Dementia  Mini-Mental Status Examination of 22/30  Complete evaluation with MRI of brain   Gait abnormality  Multifactorial, including joint pain, deconditioning, peripheral neuropathy  Home physical therapy  Marcial Pacas, M.D. Ph.D.  [facility] [address] [place], [place] [address] Ph: [phone] Fax: [phone]  CC: [name], MD

## 2019-01-25 LAB — TSH: TSH: 2.99 u[IU]/mL (ref 0.450–4.500)

## 2019-01-25 LAB — VITAMIN B12: Vitamin B-12: 499 pg/mL (ref 232–1245)

## 2019-01-25 LAB — RPR: RPR Ser Ql: NONREACTIVE

## 2019-01-28 ENCOUNTER — Telehealth: Payer: Self-pay | Admitting: *Deleted

## 2019-01-28 NOTE — Telephone Encounter (Signed)
-----   Message from Marcial Pacas, MD sent at 01/28/2019  2:32 PM EDT ----- Please call patient for normal laboratory result

## 2019-01-28 NOTE — Telephone Encounter (Signed)
Spoke to her son, [name] (on [place]).  He is aware of her normal lab results.

## 2019-02-13 ENCOUNTER — Telehealth: Payer: Self-pay

## 2019-02-13 NOTE — Telephone Encounter (Signed)
Pt's son called to report that pt has another uti and would like another round of antibiotics. Last assessed on 12/12/2018. Please advise.

## 2019-02-14 ENCOUNTER — Other Ambulatory Visit: Payer: Self-pay | Admitting: Family Medicine

## 2019-02-14 MED ORDER — CEPHALEXIN 500 MG PO CAPS: 500.0000 mg | ORAL_CAPSULE | Freq: Three times a day (TID) | ORAL | 0 refills | Status: DC

## 2019-02-14 NOTE — Telephone Encounter (Signed)
I sent Keflex to [facility] in [place].  If not improving she needs to be seen.

## 2019-02-14 NOTE — Telephone Encounter (Signed)
LVM on pt's son phone.

## 2019-02-16 ENCOUNTER — Other Ambulatory Visit: Payer: Self-pay

## 2019-02-16 ENCOUNTER — Ambulatory Visit
Admission: RE | Admit: 2019-02-16 | Discharge: 2019-02-16 | Disposition: A | Discharge status: Home / Self Care | Payer: Medicare Other | Source: Ambulatory Visit | Attending: Neurology | Admitting: Neurology

## 2019-02-16 DIAGNOSIS — R413 Other amnesia: Secondary | ICD-10-CM | POA: Diagnosis not present

## 2019-02-18 ENCOUNTER — Telehealth: Payer: Self-pay | Admitting: Neurology

## 2019-02-18 NOTE — Telephone Encounter (Signed)
Please call patient, MRI brain showed evidence of generalized atrophy, supratentorium small vessel disease, chronic lacuna infarction in the right pons, and in the right lentiform nucleus.   IMPRESSION: This MRI of the brain without contrast shows the following: 1.   Chronic lacunar infarctions in the upper right pons in the right lentiform nucleus, unchanged compared to the 07/03/2017 MRI scan. 2.   Extensive T2/flair hyperintense foci in the hemispheres consistent with moderately severe chronic microvascular ischemic change.  None of the foci appear to be acute. 3.   Moderate generalized cortical atrophy and mild corpus callosum atrophy, unchanged compared to the previous MRI.   4.   Partially empty sella turcica could be incidental or due to chronic increased intracranial pressures.  It is unchanged compared to the previous MRI 5.   There are no acute findings.

## 2019-02-18 NOTE — Telephone Encounter (Signed)
Called and spoke with the son and reviewed the MRI finding with him. Pt's son verbalized understanding of the results and had no further questions.

## 2019-03-06 ENCOUNTER — Ambulatory Visit: Payer: Medicare Other | Admitting: Podiatry

## 2019-03-22 ENCOUNTER — Other Ambulatory Visit: Payer: Self-pay

## 2019-03-25 ENCOUNTER — Other Ambulatory Visit: Payer: Self-pay | Admitting: Family Medicine

## 2019-04-03 ENCOUNTER — Telehealth: Payer: Self-pay | Admitting: Neurology

## 2019-04-03 NOTE — Telephone Encounter (Signed)
lvm to r/s 1/7 appt due to NP being out

## 2019-04-15 ENCOUNTER — Encounter: Payer: Self-pay | Admitting: Neurology

## 2019-04-15 NOTE — Telephone Encounter (Signed)
C/a letter sent for 1/7 appt

## 2019-05-02 ENCOUNTER — Other Ambulatory Visit: Payer: Self-pay

## 2019-05-02 ENCOUNTER — Encounter: Payer: Self-pay | Admitting: Family Medicine

## 2019-05-02 ENCOUNTER — Ambulatory Visit (INDEPENDENT_AMBULATORY_CARE_PROVIDER_SITE_OTHER): Payer: Medicare Other | Admitting: Family Medicine

## 2019-05-02 VITALS — BP 134/82 | HR 88 | Temp 98.7°F | Resp 20

## 2019-05-02 DIAGNOSIS — R300 Dysuria: Secondary | ICD-10-CM

## 2019-05-02 DIAGNOSIS — I6930 Unspecified sequelae of cerebral infarction: Secondary | ICD-10-CM

## 2019-05-02 LAB — URINALYSIS, ROUTINE W REFLEX MICROSCOPIC
Bilirubin Urine: NEGATIVE
Glucose, UA: NEGATIVE
Hgb urine dipstick: NEGATIVE
Hyaline Cast: NONE SEEN /LPF
Ketones, ur: NEGATIVE
Nitrite: POSITIVE — AB
Protein, ur: NEGATIVE
RBC / HPF: NONE SEEN /HPF (ref 0–2)
Specific Gravity, Urine: 1.025 (ref 1.001–1.03)
pH: 5.5 (ref 5.0–8.0)

## 2019-05-02 LAB — MICROSCOPIC MESSAGE

## 2019-05-02 MED ORDER — FLUCONAZOLE 150 MG PO TABS: 150.0000 mg | ORAL_TABLET | Freq: Once | ORAL | 0 refills | Status: AC

## 2019-05-02 MED ORDER — CIPROFLOXACIN HCL 500 MG PO TABS: 500.0000 mg | ORAL_TABLET | Freq: Two times a day (BID) | ORAL | 0 refills | Status: DC

## 2019-05-02 NOTE — Progress Notes (Signed)
Subjective:    Patient ID: [name], female    DOB: Oct 31, 1933, 85 y.o.   MRN: [id number]  HPI Patient presents with her son with a 1 week history of dysuria.  She reports urgency and burning with urination.  She denies any fevers or chills or delirium.  She denies any hematuria.  She also reports vaginal itching.  Urinalysis shows copious yeast.  There is also +1 nitrites and trace leukocyte esterase.  Microscopic analysis shows copious bacteria and yeast.  Past Medical History:  Diagnosis Date  . [name] (abdominal aortic aneurysm) ([place]) 09/2013   3.6 cm  . Acute and chronic respiratory failure with hypoxia ([place])   . Acute delirium   . Acute encephalopathy   . Acute pyelonephritis   . Atrial fibrillation ([place])   . Chronic pain syndrome   . Constipation   . Dementia ([place])   . Dependent edema    bilateral legs   . Depression   . Diabetes mellitus    pre-diabetes  . Diverticulosis   . Diverticulosis   . Dysuria   . E coli bacteremia   . Fall   . Gross hematuria   . Hydronephrosis with renal and ureteral calculous obstruction   . Hyperlipidemia   . Hypertension   . Hypoxemia   . Idiopathic peripheral autonomic neuropathy   . Infestation by bed bug   . Nephrolithiasis   . OSA on CPAP   . Osteoarthritis   . Osteopenia    T=  -2.1 in hip  . Persistent mood (affective) disorder, unspecified ([place])   . Prediabetes   . Rheumatoid arthritis ([place])   . Rheumatoid arthritis(714.0)   . S/P carpal tunnel release   . Sepsis secondary to UTI ([place])   . Spinal stenosis   . Unspecified fracture of fifth lumbar vertebra, initial encounter for closed fracture ([place])   . Unspecified fracture of t11-T12 vertebra, initial encounter for closed fracture ([place])   . Urinary incontinence   . Urinary tract infection    hx of   . Urolithiasis    Past Surgical History:  Procedure Laterality Date  . APPENDECTOMY    . CARPAL TUNNEL RELEASE  12/27/2006   Right subcutaneous ulnar nerve  transfer -- Right open carpal tunnel release  . CHOLECYSTECTOMY    . CYSTOSCOPY W/ URETERAL STENT PLACEMENT Right 01/30/2018   Procedure: CYSTOSCOPY WITH STENT REPLACEMENT retrograde pylegram;  Surgeon: Ardis Hughs, MD;  [facility];  Service: Urology;  Laterality: Right;  . CYSTOSCOPY WITH URETEROSCOPY AND STENT PLACEMENT Right 03/01/2018   Procedure: CYSTOSCPY/RIGHT URETEROSCOPY HOLMIUM LASER LITHOTRIPSY AND STENT EXCHANGE;  Surgeon: Ardis Hughs, MD;  [facility];  Service: Urology;  Laterality: Right;  . FOOT SURGERY    . HOLMIUM LASER APPLICATION Right [id number]   Procedure: HOLMIUM LASER APPLICATION;  Surgeon: Ardis Hughs, MD;  [facility];  Service: Urology;  Laterality: Right;  . JOINT REPLACEMENT     BTKR, RTHR  . TOTAL KNEE ARTHROPLASTY  08/21/2007   Right total knee replacement   Current Outpatient Medications on File Prior to Visit  Medication Sig Dispense Refill  . acetaminophen (TYLENOL) 500 MG tablet Take 500 mg by mouth every 8 (eight) hours as needed for mild pain.     Marland Kitchen ammonium lactate (AMLACTIN) 12 % cream Apply topically as needed for dry skin. 385 g 0  . aspirin EC 81 MG tablet Take 81 mg by mouth daily.    Marland Kitchen atenolol (  TENORMIN) 50 MG tablet Take 1 tablet (50 mg total) by mouth daily. 90 tablet 3  . Calcium Citrate-Vitamin D (CALCIUM + D PO) Take 1 tablet by mouth daily. 600/400    . Cholecalciferol 50 MCG (2000 UT) TABS Take by mouth.    . diclofenac sodium (VOLTAREN) 1 % GEL Apply 2 g topically 4 (four) times daily. Rub into affected area of foot 2 to 4 times daily 100 g 2  . DULoxetine (CYMBALTA) 30 MG capsule Take 30 mg by mouth daily. Takes along with 60mg  for total daily dose of 90mg .  2  . DULoxetine (CYMBALTA) 60 MG capsule Take 60 mg by mouth daily. Take along with 30mg  for a total daily dose of 90mg .    . gabapentin (NEURONTIN) 800 MG tablet Take 800-1,600 mg by mouth as directed. 800 mg in the morning, 800 mg at noon,  1600 mg at night    . oxyCODONE-acetaminophen (PERCOCET) 7.5-325 MG tablet Take 1 tablet by mouth every 8 (eight) hours as needed for severe pain. 90 tablet 0  . pantoprazole (PROTONIX) 40 MG tablet TAKE 1 TABLET BY MOUTH DAILY 30 tablet 3  . rosuvastatin (CRESTOR) 40 MG tablet Take 1 tablet (40 mg total) by mouth every evening. 90 tablet 3   No current facility-administered medications on file prior to visit.   Allergies  Allergen Reactions  . Sulfonamide Derivatives Hives and Rash   Social History   Socioeconomic History  . Marital status: Divorced    Spouse name: Not on file  . Number of children: 6  . Years of education: 8th grade  . Highest education level: Not on file  Occupational History  . Occupation: Retired  Tobacco Use  . Smoking status: Never Smoker  . Smokeless tobacco: Never Used  Substance and Sexual Activity  . Alcohol use: No  . Drug use: No  . Sexual activity: Not on file  Other Topics Concern  . Not on file  Social History Narrative   Lives at home with her sons.   She has four living children.   Right-handed.   Two cups caffeine per day.   Social Determinants of Health   Financial Resource Strain:   . Difficulty of Paying Living Expenses: Not on file  Food Insecurity:   . Worried About Charity fundraiser in the Last Year: Not on file  . Ran Out of Food in the Last Year: Not on file  Transportation Needs:   . Lack of Transportation (Medical): Not on file  . Lack of Transportation (Non-Medical): Not on file  Physical Activity:   . Days of Exercise per Week: Not on file  . Minutes of Exercise per Session: Not on file  Stress:   . Feeling of Stress : Not on file  Social Connections:   . Frequency of Communication with Friends and Family: Not on file  . Frequency of Social Gatherings with Friends and Family: Not on file  . Attends Religious Services: Not on file  . Active Member of Clubs or Organizations: Not on file  . Attends Theatre manager Meetings: Not on file  . Marital Status: Not on file  Intimate Partner Violence:   . Fear of Current or Ex-Partner: Not on file  . Emotionally Abused: Not on file  . Physically Abused: Not on file  . Sexually Abused: Not on file    Review of Systems  All other systems reviewed and are negative.      Objective:  Physical Exam Vitals signs reviewed.  Constitutional:      General: She is not in acute distress.    Appearance: She is well-developed. She is not diaphoretic.  Cardiovascular:     Rate and Rhythm: Rhythm irregular.     Heart sounds: No murmur. No friction rub. No gallop.   Pulmonary:     Effort: Pulmonary effort is normal. No respiratory distress.     Breath sounds: No stridor or decreased air movement. No rales. No decreased breath sounds, wheezing or rhonchi.  Chest:     Chest wall: No tenderness.  Abdominal:     General: Bowel sounds are normal. There is no distension.     Palpations: Abdomen is soft. There is no mass.     Tenderness: There is no abdominal tenderness. There is no guarding or rebound.     Hernia: No hernia is present.  Musculoskeletal:     Right lower leg: No edema.     Left lower leg: No edema.           Assessment & Plan:  Dysuria - Plan: Urinalysis, Routine w reflex microscopic Urinalysis shows positive nitrites, trace leukocyte esterase.  Microscopic analysis shows copious bacteria.  Therefore I will treat the patient with Cipro 500 mg p.o. twice daily for 7 days.  However she also appears to have vaginal candidiasis as yeast is seen in her urine sample as well.  Therefore I will treat the patient with Diflucan 150 mg p.o. x1.

## 2019-05-09 ENCOUNTER — Ambulatory Visit: Payer: Medicare Other | Admitting: Neurology

## 2019-05-11 ENCOUNTER — Other Ambulatory Visit: Payer: Self-pay | Admitting: Family Medicine

## 2019-06-05 ENCOUNTER — Ambulatory Visit: Payer: Medicare Other | Admitting: Podiatry

## 2019-06-18 ENCOUNTER — Ambulatory Visit: Payer: Medicare Other | Admitting: Neurology

## 2019-06-27 ENCOUNTER — Encounter: Payer: Self-pay | Admitting: Family Medicine

## 2019-06-27 ENCOUNTER — Ambulatory Visit (INDEPENDENT_AMBULATORY_CARE_PROVIDER_SITE_OTHER): Payer: Medicare Other | Admitting: Family Medicine

## 2019-06-27 ENCOUNTER — Other Ambulatory Visit: Payer: Self-pay

## 2019-06-27 VITALS — BP 122/84 | HR 89 | Temp 98.4°F

## 2019-06-27 DIAGNOSIS — L293 Anogenital pruritus, unspecified: Secondary | ICD-10-CM

## 2019-06-27 DIAGNOSIS — R300 Dysuria: Secondary | ICD-10-CM

## 2019-06-27 LAB — URINALYSIS, ROUTINE W REFLEX MICROSCOPIC
Bilirubin Urine: NEGATIVE
Glucose, UA: NEGATIVE
Hgb urine dipstick: NEGATIVE
Hyaline Cast: NONE SEEN /LPF
Ketones, ur: NEGATIVE
Nitrite: NEGATIVE
Protein, ur: NEGATIVE
RBC / HPF: NONE SEEN /HPF (ref 0–2)
Specific Gravity, Urine: 1.02 (ref 1.001–1.03)
pH: 6 (ref 5.0–8.0)

## 2019-06-27 LAB — MICROSCOPIC MESSAGE

## 2019-06-27 MED ORDER — CLOTRIMAZOLE-BETAMETHASONE 1-0.05 % EX CREA: 1.0000 "application " | TOPICAL_CREAM | Freq: Two times a day (BID) | CUTANEOUS | 0 refills | Status: DC

## 2019-06-27 MED ORDER — FLUCONAZOLE 150 MG PO TABS: 150.0000 mg | ORAL_TABLET | Freq: Once | ORAL | 0 refills | Status: AC

## 2019-06-27 NOTE — Progress Notes (Signed)
Subjective:    Patient ID: [name], female    DOB: 20-Mar-1934, 86 y.o.   MRN: [id number]  HPI Patient is an 86 year old white female who is confined to a wheelchair due to weakness in her legs who presents today for perineal itching.  She states that it just started recently.  She reports itching in the creases between her perineum and her thighs.  She also reports itching on the outside of her vagina.  She denies any dysuria or urgency or frequency.  Urinalysis today shows no glucose, no blood, no nitrites.  She has +1 leukocyte esterase however her symptoms do not sound consistent with a urinary tract infection.  She denies any pelvic pain.  She denies any nausea or vomiting. Past Medical History:  Diagnosis Date  . [name] (abdominal aortic aneurysm) ([place]) 09/2013   3.6 cm  . Acute and chronic respiratory failure with hypoxia ([place])   . Acute delirium   . Acute encephalopathy   . Acute pyelonephritis   . Atrial fibrillation ([place])   . Chronic pain syndrome   . Constipation   . Dementia ([place])   . Dependent edema    bilateral legs   . Depression   . Diabetes mellitus    pre-diabetes  . Diverticulosis   . Diverticulosis   . Dysuria   . E coli bacteremia   . Fall   . Gross hematuria   . Hydronephrosis with renal and ureteral calculous obstruction   . Hyperlipidemia   . Hypertension   . Hypoxemia   . Idiopathic peripheral autonomic neuropathy   . Infestation by bed bug   . Nephrolithiasis   . OSA on CPAP   . Osteoarthritis   . Osteopenia    T=  -2.1 in hip  . Persistent mood (affective) disorder, unspecified ([place])   . Prediabetes   . Rheumatoid arthritis ([place])   . Rheumatoid arthritis(714.0)   . S/P carpal tunnel release   . Sepsis secondary to UTI ([place])   . Spinal stenosis   . Unspecified fracture of fifth lumbar vertebra, initial encounter for closed fracture ([place])   . Unspecified fracture of t11-T12 vertebra, initial encounter for closed fracture ([place])   . Urinary  incontinence   . Urinary tract infection    hx of   . Urolithiasis    Past Surgical History:  Procedure Laterality Date  . APPENDECTOMY    . CARPAL TUNNEL RELEASE  12/27/2006   Right subcutaneous ulnar nerve transfer -- Right open carpal tunnel release  . CHOLECYSTECTOMY    . CYSTOSCOPY W/ URETERAL STENT PLACEMENT Right 01/30/2018   Procedure: CYSTOSCOPY WITH STENT REPLACEMENT retrograde pylegram;  Surgeon: Ardis Hughs, MD;  Location: [facility];  Service: Urology;  Laterality: Right;  . CYSTOSCOPY WITH URETEROSCOPY AND STENT PLACEMENT Right 03/01/2018   Procedure: CYSTOSCPY/RIGHT URETEROSCOPY HOLMIUM LASER LITHOTRIPSY AND STENT EXCHANGE;  Surgeon: Ardis Hughs, MD;  Location: [facility];  Service: Urology;  Laterality: Right;  . FOOT SURGERY    . HOLMIUM LASER APPLICATION Right [id number]   Procedure: HOLMIUM LASER APPLICATION;  Surgeon: Ardis Hughs, MD;  Location: [facility];  Service: Urology;  Laterality: Right;  . JOINT REPLACEMENT     BTKR, RTHR  . TOTAL KNEE ARTHROPLASTY  08/21/2007   Right total knee replacement   Current Outpatient Medications on File Prior to Visit  Medication Sig Dispense Refill  . acetaminophen (TYLENOL) 500 MG tablet Take 500 mg by mouth every 8 (eight) hours as needed  for mild pain.     Marland Kitchen ammonium lactate (AMLACTIN) 12 % cream Apply topically as needed for dry skin. 385 g 0  . aspirin EC 81 MG tablet Take 81 mg by mouth daily.    Marland Kitchen atenolol (TENORMIN) 50 MG tablet TAKE 1 TABLET BY MOUTH DAILY 90 tablet 3  . Calcium Citrate-Vitamin D (CALCIUM + D PO) Take 1 tablet by mouth daily. 600/400    . Cholecalciferol 50 MCG (2000 UT) TABS Take by mouth.    . diclofenac sodium (VOLTAREN) 1 % GEL Apply 2 g topically 4 (four) times daily. Rub into affected area of foot 2 to 4 times daily 100 g 2  . DULoxetine (CYMBALTA) 30 MG capsule Take 30 mg by mouth daily. Takes along with 60mg  for total daily dose of 90mg .  2  . DULoxetine (CYMBALTA) 60 MG capsule  Take 60 mg by mouth daily. Take along with 30mg  for a total daily dose of 90mg .    . gabapentin (NEURONTIN) 800 MG tablet Take 800-1,600 mg by mouth as directed. 800 mg in the morning, 800 mg at noon, 1600 mg at night    . oxyCODONE-acetaminophen (PERCOCET) 7.5-325 MG tablet Take 1 tablet by mouth every 8 (eight) hours as needed for severe pain. 90 tablet 0  . pantoprazole (PROTONIX) 40 MG tablet TAKE 1 TABLET BY MOUTH DAILY 30 tablet 3  . rosuvastatin (CRESTOR) 40 MG tablet Take 1 tablet (40 mg total) by mouth every evening. 90 tablet 3   No current facility-administered medications on file prior to visit.   Allergies  Allergen Reactions  . Sulfonamide Derivatives Hives and Rash   Social History   Socioeconomic History  . Marital status: Divorced    Spouse name: Not on file  . Number of children: 6  . Years of education: 8th grade  . Highest education level: Not on file  Occupational History  . Occupation: Retired  Tobacco Use  . Smoking status: Never Smoker  . Smokeless tobacco: Never Used  Substance and Sexual Activity  . Alcohol use: No  . Drug use: No  . Sexual activity: Not on file  Other Topics Concern  . Not on file  Social History Narrative   Lives at home with her sons.   She has four living children.   Right-handed.   Two cups caffeine per day.   Social Determinants of Health   Financial Resource Strain:   . Difficulty of Paying Living Expenses: Not on file  Food Insecurity:   . Worried About Charity fundraiser in the Last Year: Not on file  . Ran Out of Food in the Last Year: Not on file  Transportation Needs:   . Lack of Transportation (Medical): Not on file  . Lack of Transportation (Non-Medical): Not on file  Physical Activity:   . Days of Exercise per Week: Not on file  . Minutes of Exercise per Session: Not on file  Stress:   . Feeling of Stress : Not on file  Social Connections:   . Frequency of Communication with Friends and Family: Not on file   . Frequency of Social Gatherings with Friends and Family: Not on file  . Attends Religious Services: Not on file  . Active Member of Clubs or Organizations: Not on file  . Attends Archivist Meetings: Not on file  . Marital Status: Not on file  Intimate Partner Violence:   . Fear of Current or Ex-Partner: Not on file  .  Emotionally Abused: Not on file  . Physically Abused: Not on file  . Sexually Abused: Not on file      Review of Systems  All other systems reviewed and are negative.      Objective:   Physical Exam Vitals reviewed.  Cardiovascular:     Rate and Rhythm: Normal rate and regular rhythm.     Heart sounds: Normal heart sounds.  Pulmonary:     Effort: Pulmonary effort is normal.     Breath sounds: Normal breath sounds.  Abdominal:     Palpations: Abdomen is soft.     Tenderness: There is no abdominal tenderness.           Assessment & Plan:  Dysuria - Plan: Urinalysis, Routine w reflex microscopic, Urine Culture  Perineal itching, female  I am physically unable to get the patient on the exam table in order to perform a pelvic exam.  She is too weak to stand on her own.  Therefore the patient is comfortable with me treating her empirically for possible vulvar candidiasis.  I have recommended trying Lotrisone cream in the crease between her legs and her perineum twice daily for 10 days.  We will try Diflucan 150 mg p.o. x1 and repeat again in 1 week.  Recheck in 2 weeks.  If itching persists, also the differential diagnosis would be lichen sclerosis at atrophicus versus lichen simplex chronicus.  There is also the possibility of vulvar cancer.  I would have to be able to examine the area to be able to make a diagnosis.  Therefore we will need to be able to do a pelvic exam if the patient's symptoms do not improve with empiric therapy.

## 2019-06-29 LAB — URINE CULTURE
MICRO NUMBER:: 10188418
SPECIMEN QUALITY:: ADEQUATE

## 2019-07-02 ENCOUNTER — Other Ambulatory Visit: Payer: Self-pay | Admitting: Family Medicine

## 2019-07-02 DIAGNOSIS — N390 Urinary tract infection, site not specified: Secondary | ICD-10-CM

## 2019-07-02 MED ORDER — NITROFURANTOIN MONOHYD MACRO 100 MG PO CAPS: 100.0000 mg | ORAL_CAPSULE | Freq: Two times a day (BID) | ORAL | 0 refills | Status: AC

## 2019-07-09 ENCOUNTER — Other Ambulatory Visit: Payer: Self-pay

## 2019-07-09 ENCOUNTER — Observation Stay (HOSPITAL_COMMUNITY)
Admission: EM | Admit: 2019-07-09 | Discharge: 2019-07-10 | Disposition: A | Discharge status: Home / Self Care | Payer: Medicare Other | Attending: Internal Medicine | Admitting: Internal Medicine

## 2019-07-09 ENCOUNTER — Ambulatory Visit: Payer: Medicare Other | Admitting: Family Medicine

## 2019-07-09 ENCOUNTER — Emergency Department (HOSPITAL_COMMUNITY): Payer: Medicare Other

## 2019-07-09 ENCOUNTER — Encounter (HOSPITAL_COMMUNITY): Payer: Self-pay | Admitting: Emergency Medicine

## 2019-07-09 ENCOUNTER — Ambulatory Visit (INDEPENDENT_AMBULATORY_CARE_PROVIDER_SITE_OTHER): Payer: Medicare Other | Admitting: Family Medicine

## 2019-07-09 VITALS — BP 110/64 | HR 89 | Temp 97.8°F | Resp 14

## 2019-07-09 DIAGNOSIS — Z9049 Acquired absence of other specified parts of digestive tract: Secondary | ICD-10-CM | POA: Diagnosis not present

## 2019-07-09 DIAGNOSIS — G4733 Obstructive sleep apnea (adult) (pediatric): Secondary | ICD-10-CM | POA: Diagnosis not present

## 2019-07-09 DIAGNOSIS — I714 Abdominal aortic aneurysm, without rupture, unspecified: Secondary | ICD-10-CM | POA: Diagnosis present

## 2019-07-09 DIAGNOSIS — I5031 Acute diastolic (congestive) heart failure: Secondary | ICD-10-CM | POA: Diagnosis not present

## 2019-07-09 DIAGNOSIS — I110 Hypertensive heart disease with heart failure: Secondary | ICD-10-CM | POA: Diagnosis not present

## 2019-07-09 DIAGNOSIS — I482 Chronic atrial fibrillation, unspecified: Secondary | ICD-10-CM | POA: Diagnosis not present

## 2019-07-09 DIAGNOSIS — Z833 Family history of diabetes mellitus: Secondary | ICD-10-CM | POA: Insufficient documentation

## 2019-07-09 DIAGNOSIS — G894 Chronic pain syndrome: Secondary | ICD-10-CM | POA: Insufficient documentation

## 2019-07-09 DIAGNOSIS — R0602 Shortness of breath: Secondary | ICD-10-CM | POA: Diagnosis not present

## 2019-07-09 DIAGNOSIS — I700 Atherosclerosis of aorta: Secondary | ICD-10-CM | POA: Insufficient documentation

## 2019-07-09 DIAGNOSIS — Z79899 Other long term (current) drug therapy: Secondary | ICD-10-CM | POA: Insufficient documentation

## 2019-07-09 DIAGNOSIS — Z7982 Long term (current) use of aspirin: Secondary | ICD-10-CM | POA: Insufficient documentation

## 2019-07-09 DIAGNOSIS — F0390 Unspecified dementia without behavioral disturbance: Secondary | ICD-10-CM | POA: Diagnosis not present

## 2019-07-09 DIAGNOSIS — R079 Chest pain, unspecified: Secondary | ICD-10-CM | POA: Diagnosis not present

## 2019-07-09 DIAGNOSIS — Z6833 Body mass index (BMI) 33.0-33.9, adult: Secondary | ICD-10-CM | POA: Diagnosis not present

## 2019-07-09 DIAGNOSIS — Z8249 Family history of ischemic heart disease and other diseases of the circulatory system: Secondary | ICD-10-CM | POA: Diagnosis not present

## 2019-07-09 DIAGNOSIS — Z882 Allergy status to sulfonamides status: Secondary | ICD-10-CM | POA: Insufficient documentation

## 2019-07-09 DIAGNOSIS — I10 Essential (primary) hypertension: Secondary | ICD-10-CM | POA: Diagnosis not present

## 2019-07-09 DIAGNOSIS — M4800 Spinal stenosis, site unspecified: Secondary | ICD-10-CM | POA: Diagnosis not present

## 2019-07-09 DIAGNOSIS — I5032 Chronic diastolic (congestive) heart failure: Secondary | ICD-10-CM | POA: Diagnosis present

## 2019-07-09 DIAGNOSIS — R0600 Dyspnea, unspecified: Secondary | ICD-10-CM | POA: Diagnosis present

## 2019-07-09 DIAGNOSIS — Z96653 Presence of artificial knee joint, bilateral: Secondary | ICD-10-CM | POA: Insufficient documentation

## 2019-07-09 DIAGNOSIS — M810 Age-related osteoporosis without current pathological fracture: Secondary | ICD-10-CM | POA: Diagnosis present

## 2019-07-09 DIAGNOSIS — Z96641 Presence of right artificial hip joint: Secondary | ICD-10-CM | POA: Diagnosis not present

## 2019-07-09 DIAGNOSIS — Z823 Family history of stroke: Secondary | ICD-10-CM | POA: Insufficient documentation

## 2019-07-09 DIAGNOSIS — F329 Major depressive disorder, single episode, unspecified: Secondary | ICD-10-CM | POA: Diagnosis not present

## 2019-07-09 DIAGNOSIS — I2510 Atherosclerotic heart disease of native coronary artery without angina pectoris: Secondary | ICD-10-CM | POA: Diagnosis not present

## 2019-07-09 DIAGNOSIS — Z20822 Contact with and (suspected) exposure to covid-19: Secondary | ICD-10-CM | POA: Diagnosis not present

## 2019-07-09 DIAGNOSIS — Z791 Long term (current) use of non-steroidal anti-inflammatories (NSAID): Secondary | ICD-10-CM | POA: Insufficient documentation

## 2019-07-09 DIAGNOSIS — E1140 Type 2 diabetes mellitus with diabetic neuropathy, unspecified: Secondary | ICD-10-CM | POA: Diagnosis not present

## 2019-07-09 DIAGNOSIS — Z9119 Patient's noncompliance with other medical treatment and regimen: Secondary | ICD-10-CM | POA: Insufficient documentation

## 2019-07-09 DIAGNOSIS — M069 Rheumatoid arthritis, unspecified: Secondary | ICD-10-CM | POA: Diagnosis not present

## 2019-07-09 DIAGNOSIS — E785 Hyperlipidemia, unspecified: Secondary | ICD-10-CM | POA: Diagnosis not present

## 2019-07-09 DIAGNOSIS — R410 Disorientation, unspecified: Secondary | ICD-10-CM | POA: Diagnosis not present

## 2019-07-09 DIAGNOSIS — I4891 Unspecified atrial fibrillation: Secondary | ICD-10-CM | POA: Diagnosis not present

## 2019-07-09 DIAGNOSIS — I5033 Acute on chronic diastolic (congestive) heart failure: Secondary | ICD-10-CM | POA: Diagnosis not present

## 2019-07-09 DIAGNOSIS — R413 Other amnesia: Secondary | ICD-10-CM | POA: Diagnosis present

## 2019-07-09 DIAGNOSIS — I2584 Coronary atherosclerosis due to calcified coronary lesion: Secondary | ICD-10-CM | POA: Diagnosis not present

## 2019-07-09 DIAGNOSIS — R269 Unspecified abnormalities of gait and mobility: Secondary | ICD-10-CM

## 2019-07-09 DIAGNOSIS — R0789 Other chest pain: Secondary | ICD-10-CM | POA: Diagnosis not present

## 2019-07-09 LAB — BASIC METABOLIC PANEL
Anion gap: 13 (ref 5–15)
BUN: 23 mg/dL (ref 8–23)
CO2: 23 mmol/L (ref 22–32)
Calcium: 9.3 mg/dL (ref 8.9–10.3)
Chloride: 97 mmol/L — ABNORMAL LOW (ref 98–111)
Creatinine, Ser: 0.99 mg/dL (ref 0.44–1.00)
GFR calc Af Amer: >60 mL/min (ref >60)
GFR calc non Af Amer: 53 mL/min — ABNORMAL LOW (ref >60)
Glucose, Bld: 115 mg/dL — ABNORMAL HIGH (ref 70–99)
Potassium: 4.3 mmol/L (ref 3.5–5.1)
Sodium: 133 mmol/L — ABNORMAL LOW (ref 135–145)

## 2019-07-09 LAB — HEPATIC FUNCTION PANEL
ALT: 13 U/L (ref 0–44)
AST: 25 U/L (ref 15–41)
Albumin: 3.9 g/dL (ref 3.5–5.0)
Alkaline Phosphatase: 43 U/L (ref 38–126)
Bilirubin, Direct: 0.2 mg/dL (ref 0.0–0.2)
Indirect Bilirubin: 1.2 mg/dL — ABNORMAL HIGH (ref 0.3–0.9)
Total Bilirubin: 1.4 mg/dL — ABNORMAL HIGH (ref 0.3–1.2)
Total Protein: 7.2 g/dL (ref 6.5–8.1)

## 2019-07-09 LAB — CBC
HCT: 41.5 % (ref 36.0–46.0)
Hemoglobin: 13.2 g/dL (ref 12.0–15.0)
MCH: 28.8 pg (ref 26.0–34.0)
MCHC: 31.8 g/dL (ref 30.0–36.0)
MCV: 90.6 fL (ref 80.0–100.0)
Platelets: 165 10*3/uL (ref 150–400)
RBC: 4.58 MIL/uL (ref 3.87–5.11)
RDW: 13.4 % (ref 11.5–15.5)
WBC: 8.2 10*3/uL (ref 4.0–10.5)
nRBC: 0 % (ref 0.0–0.2)

## 2019-07-09 LAB — TROPONIN I (HIGH SENSITIVITY)
Troponin I (High Sensitivity): 9 ng/L (ref <18)
Troponin I (High Sensitivity): 9 ng/L (ref <18)

## 2019-07-09 LAB — MAGNESIUM: Magnesium: 1.7 mg/dL (ref 1.7–2.4)

## 2019-07-09 LAB — D-DIMER, QUANTITATIVE: D-Dimer, Quant: 0.93 ug/mL-FEU — ABNORMAL HIGH (ref 0.00–0.50)

## 2019-07-09 LAB — BRAIN NATRIURETIC PEPTIDE: B Natriuretic Peptide: 219.6 pg/mL — ABNORMAL HIGH (ref 0.0–100.0)

## 2019-07-09 LAB — SARS CORONAVIRUS 2 (TAT 6-24 HRS): SARS Coronavirus 2: NEGATIVE

## 2019-07-09 LAB — CBG MONITORING, ED: Glucose-Capillary: 107 mg/dL — ABNORMAL HIGH (ref 70–99)

## 2019-07-09 MED ORDER — ACETAMINOPHEN 325 MG PO TABS: 650.0000 mg | ORAL_TABLET | ORAL | Status: DC | PRN

## 2019-07-09 MED ORDER — ACETAMINOPHEN 500 MG PO TABS: 500.0000 mg | ORAL_TABLET | Freq: Three times a day (TID) | ORAL | Status: DC | PRN

## 2019-07-09 MED ORDER — GABAPENTIN 400 MG PO CAPS
1600.0000 mg | ORAL_CAPSULE | Freq: Every day | ORAL | Status: DC
  Administered 2019-07-09: 1600 mg via ORAL
  Filled 2019-07-09: qty 4

## 2019-07-09 MED ORDER — FUROSEMIDE 10 MG/ML IJ SOLN
20.0000 mg | Freq: Two times a day (BID) | INTRAMUSCULAR | Status: DC
  Administered 2019-07-09 – 2019-07-10 (×2): 20 mg via INTRAVENOUS
  Filled 2019-07-09 (×2): qty 2

## 2019-07-09 MED ORDER — ATENOLOL 50 MG PO TABS
50.0000 mg | ORAL_TABLET | Freq: Every day | ORAL | Status: DC
  Administered 2019-07-10: 50 mg via ORAL
  Filled 2019-07-09: qty 1

## 2019-07-09 MED ORDER — SODIUM CHLORIDE 0.9% FLUSH: 3.0000 mL | Freq: Two times a day (BID) | INTRAVENOUS | Status: DC

## 2019-07-09 MED ORDER — GABAPENTIN 400 MG PO CAPS
800.0000 mg | ORAL_CAPSULE | Freq: Two times a day (BID) | ORAL | Status: DC
  Administered 2019-07-10 (×2): 800 mg via ORAL
  Filled 2019-07-09 (×2): qty 2

## 2019-07-09 MED ORDER — ONDANSETRON HCL 4 MG/2ML IJ SOLN: 4.0000 mg | Freq: Four times a day (QID) | INTRAMUSCULAR | Status: DC | PRN

## 2019-07-09 MED ORDER — ASPIRIN EC 81 MG PO TBEC
81.0000 mg | DELAYED_RELEASE_TABLET | Freq: Every day | ORAL | Status: DC
  Administered 2019-07-10: 81 mg via ORAL
  Filled 2019-07-09: qty 1

## 2019-07-09 MED ORDER — IOHEXOL 350 MG/ML SOLN
80.0000 mL | Freq: Once | INTRAVENOUS | Status: AC | PRN
  Administered 2019-07-09: 80 mL via INTRAVENOUS

## 2019-07-09 MED ORDER — SODIUM CHLORIDE 0.9% FLUSH
3.0000 mL | INTRAVENOUS | Status: DC | PRN
  Administered 2019-07-09: 3 mL via INTRAVENOUS

## 2019-07-09 MED ORDER — OXYCODONE-ACETAMINOPHEN 5-325 MG PO TABS
1.0000 | ORAL_TABLET | Freq: Three times a day (TID) | ORAL | Status: DC | PRN
  Administered 2019-07-09: 1 via ORAL
  Filled 2019-07-09 (×2): qty 1

## 2019-07-09 MED ORDER — DULOXETINE HCL 60 MG PO CPEP
90.0000 mg | ORAL_CAPSULE | Freq: Every day | ORAL | Status: DC
  Administered 2019-07-10: 90 mg via ORAL
  Filled 2019-07-09: qty 1

## 2019-07-09 MED ORDER — ASPIRIN 81 MG PO CHEW
324.0000 mg | CHEWABLE_TABLET | Freq: Once | ORAL | Status: AC
  Administered 2019-07-09: 324 mg via ORAL

## 2019-07-09 MED ORDER — SODIUM CHLORIDE 0.9 % IV SOLN: 250.0000 mL | INTRAVENOUS | Status: DC | PRN

## 2019-07-09 MED ORDER — DICLOFENAC SODIUM 1 % TD GEL
2.0000 g | Freq: Four times a day (QID) | TRANSDERMAL | Status: DC
  Filled 2019-07-09: qty 100

## 2019-07-09 MED ORDER — NITROGLYCERIN 0.4 MG SL SUBL
0.4000 mg | SUBLINGUAL_TABLET | SUBLINGUAL | Status: DC | PRN
  Administered 2019-07-09: 0.4 mg via SUBLINGUAL
  Filled 2019-07-09: qty 1

## 2019-07-09 MED ORDER — DULOXETINE HCL 60 MG PO CPEP: 60.0000 mg | ORAL_CAPSULE | Freq: Every day | ORAL | Status: DC

## 2019-07-09 MED ORDER — PANTOPRAZOLE SODIUM 40 MG PO TBEC
40.0000 mg | DELAYED_RELEASE_TABLET | Freq: Every day | ORAL | Status: DC
  Administered 2019-07-10: 40 mg via ORAL
  Filled 2019-07-09: qty 1

## 2019-07-09 MED ORDER — ZOLPIDEM TARTRATE 5 MG PO TABS
5.0000 mg | ORAL_TABLET | Freq: Every evening | ORAL | Status: DC | PRN
  Administered 2019-07-09: 5 mg via ORAL
  Filled 2019-07-09: qty 1

## 2019-07-09 MED ORDER — ENOXAPARIN SODIUM 40 MG/0.4ML ~~LOC~~ SOLN
40.0000 mg | SUBCUTANEOUS | Status: DC
  Administered 2019-07-09: 40 mg via SUBCUTANEOUS
  Filled 2019-07-09: qty 0.4

## 2019-07-09 MED ORDER — ROSUVASTATIN CALCIUM 20 MG PO TABS
40.0000 mg | ORAL_TABLET | Freq: Every evening | ORAL | Status: DC
  Administered 2019-07-09: 40 mg via ORAL
  Filled 2019-07-09: qty 2

## 2019-07-09 MED ORDER — LISINOPRIL 2.5 MG PO TABS
2.5000 mg | ORAL_TABLET | Freq: Every day | ORAL | Status: DC
  Administered 2019-07-09 – 2019-07-10 (×2): 2.5 mg via ORAL
  Filled 2019-07-09 (×2): qty 1

## 2019-07-09 NOTE — ED Triage Notes (Signed)
Pt arrives via EMS from PCP due to cardiac issues but pt unable to verbalize any pain. Pt from home with son. Family reports they called 911 this am around 4 due to SOB. 324 ASA given at PCP. A-fib. Pt denies pain but hx of dementia. Per daughter, pt is on oxygen at home.

## 2019-07-09 NOTE — H&P (Addendum)
History and Physical    [name] [id number] DOB: Jul 11, 1933 DOA: 07/09/2019  PCP: [name], MD  Patient coming from: Home  I have personally briefly reviewed patient's old medical records in [place]  Chief Complaint: Shortness of breath  HPI: [name] is a 86 y.o. female with medical history significant of chronic atrial fibrillation, [name], OSA not currently on CPAP at home per daughter, hypertension, chronic pain syndrome, hyperlipidemia, dementia (however daughter denies this) who presents to the ED with several hour history of shortness of breath.  Daughter at bedside and most of the history obtained from the daughter.  Daughter does state that EMS was called around 4:30 AM due to patient's shortness of breath.  Per daughter when EMS got there patient sats was 98% on room air, vital signs stable and as such family decided to take patient to his scheduled PCPs appointment on the day of admission.  Per daughter when patient got to PCPs office patient had some complaints of chest pain noted to be in chronic A. fib and subsequently sent to the ED.  Patient with complaints of diffuse pain all over all although has chronic pain syndrome.  Patient at my interview denies any ongoing chest pain.  Patient denies any fevers, no chills, no nausea, no vomiting, no abdominal pain, no diarrhea, no constipation, no melena, no hematemesis, no hematochezia, no lightheadedness, no syncopal episodes.  Patient does endorse some shortness of breath.  ED Course: Patient seen in the ED, per ED physician patient had presented with chest pain although patient denies any ongoing chest pain.  Cardiac enzymes obtained in the ED was stone cold normal with a high-sensitivity troponin of 9x2.  EKG done showed chronic A. fib with no ischemic changes noted.  D-dimer done was elevated and as such CT angiogram chest obtained.  CT angiogram chest was negative for acute PE however did show findings of  right-sided heart failure, moderate cardiomegaly, four-vessel moderate and severe coronary calcification, 3.7 cm short segment saccular dilatation of the descending thoracic aorta over a 2 cm segment, increased from 3.4 in February 2020, 3.4 cm.  Some dilatation of the aortic arch similar to February 2020 recommend annual imaging follow-up by CT or MRA.  Incomplete imaging of upper abdominal aorta aneurysmal dilatation which measures 4.2 cm in February 2020.  Comprehensive metabolic profile with a sodium of 133, chloride of 97, glucose of 115, indirect bilirubin of 1.2, total bilirubin of 1.4 otherwise was within normal limits.  BNP at 219.6.  CBC unremarkable.  Chest x-ray with stable atherosclerosis and ectasia of the thoracic aorta, no acute intrathoracic process.  ED physician requested patient be admitted for chest pain and shortness of breath although high-sensitivity troponins were negative x2, no ischemic changes noted on EKG, patient with no ongoing active chest pain.  Lasix was not given in the emergency room.  Review of Systems: As per HPI otherwise 10 point review of systems negative.   Past Medical History:  Diagnosis Date  . [name] (abdominal aortic aneurysm) ([place]) 09/2013   3.6 cm  . Acute and chronic respiratory failure with hypoxia ([place])   . Acute delirium   . Acute encephalopathy   . Acute pyelonephritis   . Atrial fibrillation ([place])   . Chronic pain syndrome   . Constipation   . Dementia ([place])   . Dependent edema    bilateral legs   . Depression   . Diabetes mellitus    pre-diabetes  .  Diverticulosis   . Diverticulosis   . Dysuria   . E coli bacteremia   . Fall   . Gross hematuria   . Hydronephrosis with renal and ureteral calculous obstruction   . Hyperlipidemia   . Hypertension   . Hypoxemia   . Idiopathic peripheral autonomic neuropathy   . Infestation by bed bug   . Nephrolithiasis   . OSA on CPAP   . Osteoarthritis   . Osteopenia    T=  -2.1 in hip  .  Persistent mood (affective) disorder, unspecified ([place])   . Prediabetes   . Rheumatoid arthritis ([place])   . Rheumatoid arthritis(714.0)   . S/P carpal tunnel release   . Sepsis secondary to UTI ([place])   . Spinal stenosis   . Unspecified fracture of fifth lumbar vertebra, initial encounter for closed fracture ([place])   . Unspecified fracture of t11-T12 vertebra, initial encounter for closed fracture ([place])   . Urinary incontinence   . Urinary tract infection    hx of   . Urolithiasis     Past Surgical History:  Procedure Laterality Date  . APPENDECTOMY    . CARPAL TUNNEL RELEASE  12/27/2006   Right subcutaneous ulnar nerve transfer -- Right open carpal tunnel release  . CHOLECYSTECTOMY    . CYSTOSCOPY W/ URETERAL STENT PLACEMENT Right 01/30/2018   Procedure: CYSTOSCOPY WITH STENT REPLACEMENT retrograde pylegram;  Surgeon: Ardis Hughs, MD;  Location: [facility];  Service: Urology;  Laterality: Right;  . CYSTOSCOPY WITH URETEROSCOPY AND STENT PLACEMENT Right 03/01/2018   Procedure: CYSTOSCPY/RIGHT URETEROSCOPY HOLMIUM LASER LITHOTRIPSY AND STENT EXCHANGE;  Surgeon: Ardis Hughs, MD;  Location: [facility];  Service: Urology;  Laterality: Right;  . FOOT SURGERY    . HOLMIUM LASER APPLICATION Right [id number]   Procedure: HOLMIUM LASER APPLICATION;  Surgeon: Ardis Hughs, MD;  Location: [facility];  Service: Urology;  Laterality: Right;  . JOINT REPLACEMENT     BTKR, RTHR  . TOTAL KNEE ARTHROPLASTY  08/21/2007   Right total knee replacement     reports that she has never smoked. She has never used smokeless tobacco. She reports that she does not drink alcohol or use drugs.  Allergies  Allergen Reactions  . Sulfonamide Derivatives Hives and Rash    Family History  Problem Relation Age of Onset  . Stroke Father   . Coronary artery disease Son        CABG at age 22  . Heart disease Son   . Heart attack Son   . Stroke Mother   . Hypertension Sister   . Diabetes Brother     . Hypertension Maternal Aunt    Family history reviewed in chart.  Follow with a prior history of CVA.  Patient noted to have positive coronary artery disease and the son.  Mother with history of CVA.  Prior to Admission medications   Medication Sig Start Date End Date Taking? Authorizing Provider  acetaminophen (TYLENOL) 500 MG tablet Take 500 mg by mouth every 8 (eight) hours as needed for mild pain.    Yes [provider]  ammonium lactate (AMLACTIN) 12 % cream Apply topically as needed for dry skin. 08/17/18  Yes Trula Slade, DPM  aspirin EC 81 MG tablet Take 81 mg by mouth daily.   Yes [provider]  atenolol (TENORMIN) 50 MG tablet TAKE 1 TABLET BY MOUTH DAILY Patient taking differently: Take 50 mg by mouth daily.  05/13/19  Yes [place], Modena Nunnery, MD  Calcium Citrate-Vitamin D (CALCIUM + D PO) Take 1 tablet by mouth daily. 600/400   Yes [provider]  Cholecalciferol 50 MCG (2000 UT) TABS Take by mouth.   Yes [provider]  diclofenac sodium (VOLTAREN) 1 % GEL Apply 2 g topically 4 (four) times daily. Rub into affected area of foot 2 to 4 times daily 08/17/18  Yes Trula Slade, DPM  DULoxetine (CYMBALTA) 30 MG capsule Take 30 mg by mouth daily. Takes along with 60mg  for total daily dose of 90mg . 07/29/14  Yes [provider]  DULoxetine (CYMBALTA) 60 MG capsule Take 60 mg by mouth daily. Take along with 30mg  for a total daily dose of 90mg .   Yes [provider]  gabapentin (NEURONTIN) 800 MG tablet Take 800-1,600 mg by mouth as directed. 800 mg in the morning, 800 mg at noon, 1600 mg at night 03/26/18  Yes [provider]  oxyCODONE-acetaminophen (PERCOCET) 7.5-325 MG tablet Take 1 tablet by mouth every 8 (eight) hours as needed for severe pain. 04/26/18  Yes [name], MD  pantoprazole (PROTONIX) 40 MG tablet TAKE 1 TABLET BY MOUTH DAILY Patient taking differently: Take 40 mg by mouth daily.  03/25/19   Yes [place], Modena Nunnery, MD  rosuvastatin (CRESTOR) 40 MG tablet Take 1 tablet (40 mg total) by mouth every evening. 01/02/19  Yes [name], MD  clotrimazole-betamethasone (LOTRISONE) cream Apply 1 application topically 2 (two) times daily. Patient not taking: Reported on 07/09/2019 06/27/19   [name], MD  nitrofurantoin, macrocrystal-monohydrate, (MACROBID) 100 MG capsule Take 1 capsule (100 mg total) by mouth 2 (two) times daily for 7 days. Patient not taking: Reported on 07/09/2019 07/02/19 07/09/19  [name], MD    Physical Exam: Vitals:   07/09/19 1430 07/09/19 1500 07/09/19 1600 07/09/19 1718  BP:  133/84  (!) 141/69  Pulse: 88 88 80 91  Resp: (!) 21 17 (!) 21 (!) 22  Temp:      TempSrc:      SpO2: 96% 99% 99% 96%    Constitutional: NAD, calm, comfortable Vitals:   07/09/19 1430 07/09/19 1500 07/09/19 1600 07/09/19 1718  BP:  133/84  (!) 141/69  Pulse: 88 88 80 91  Resp: (!) 21 17 (!) 21 (!) 22  Temp:      TempSrc:      SpO2: 96% 99% 99% 96%   Eyes: PERRL, lids and conjunctivae normal ENMT: Mucous membranes are dry. Posterior pharynx clear of any exudate or lesions.Normal dentition.  Neck: normal, supple, no masses, no thyromegaly Respiratory: Bibasilar crackles noted.  No wheezing.  No rhonchi.  No use of accessory muscles of respiration.  Speaking in full sentences.  Cardiovascular: Irregularly irregular.  Trace bilateral lower extremity edema.  No pedal pulses.  Unable to assess JVD due to neck thickness. Abdomen: no tenderness, no masses palpated. No hepatosplenomegaly. Bowel sounds positive.  Musculoskeletal: no clubbing / cyanosis. No joint deformity upper and lower extremities. Good ROM, no contractures. Normal muscle tone.  Skin: no rashes, lesions, ulcers. No induration Neurologic: CN 2-12 grossly intact.  Moving extremities spontaneously Psychiatric: Normal judgment and insight. Alert and oriented x 3. Normal mood.   Labs on Admission: I have  personally reviewed following labs and imaging studies  CBC: Recent Labs  Lab 07/09/19 1128  WBC 8.2  HGB 13.2  HCT 41.5  MCV 90.6  PLT [id number]   Basic Metabolic Panel: Recent Labs  Lab 07/09/19 1128  NA 133*  K 4.3  CL 97*  CO2 23  GLUCOSE 115*  BUN 23  CREATININE 0.99  CALCIUM 9.3   GFR: CrCl cannot be calculated (Unknown ideal weight.). Liver Function Tests: Recent Labs  Lab 07/09/19 1128  AST 25  ALT 13  ALKPHOS 43  BILITOT 1.4*  PROT 7.2  ALBUMIN 3.9   No results for input(s): LIPASE, AMYLASE in the last 168 hours. No results for input(s): AMMONIA in the last 168 hours. Coagulation Profile: No results for input(s): INR, PROTIME in the last 168 hours. Cardiac Enzymes: No results for input(s): CKTOTAL, CKMB, CKMBINDEX, TROPONINI in the last 168 hours. BNP (last 3 results) No results for input(s): PROBNP in the last 8760 hours. HbA1C: No results for input(s): HGBA1C in the last 72 hours. CBG: Recent Labs  Lab 07/09/19 1120  GLUCAP 107*   Lipid Profile: No results for input(s): CHOL, HDL, LDLCALC, TRIG, CHOLHDL, LDLDIRECT in the last 72 hours. Thyroid Function Tests: No results for input(s): TSH, T4TOTAL, FREET4, T3FREE, THYROIDAB in the last 72 hours. Anemia Panel: No results for input(s): VITAMINB12, FOLATE, FERRITIN, TIBC, IRON, RETICCTPCT in the last 72 hours. Urine analysis:    Component Value Date/Time   COLORURINE YELLOW 06/27/2019 1202   APPEARANCEUR CLOUDY (A) 06/27/2019 1202   LABSPEC 1.020 06/27/2019 1202   PHURINE 6.0 06/27/2019 1202   GLUCOSEU NEGATIVE 06/27/2019 1202   GLUCOSEU NEG mg/dL 03/08/2007 2031   HGBUR NEGATIVE 06/27/2019 1202   HGBUR negative 03/06/2008 0846   BILIRUBINUR NEGATIVE 06/09/2018 0136   KETONESUR NEGATIVE 06/27/2019 1202   PROTEINUR NEGATIVE 06/27/2019 1202   UROBILINOGEN 0.2 09/20/2013 1535   NITRITE NEGATIVE 06/27/2019 1202   LEUKOCYTESUR 1+ (A) 06/27/2019 1202    Radiological Exams on Admission: DG  Chest 2 View  Result Date: 07/09/2019 CLINICAL DATA:  Chest pain, short of breath EXAM: CHEST - 2 VIEW COMPARISON:  06/08/2018 FINDINGS: Frontal and lateral views of the chest demonstrate stable enlargement the cardiac silhouette. There is marked ectasia of the thoracic aorta with diffuse atherosclerosis, not appreciably changed since previous exam. No airspace disease, effusion, or pneumothorax. No acute bony abnormalities. Chronic T12 vertebral plana. IMPRESSION: 1. Stable atherosclerosis and ectasia of the thoracic aorta. 2. No acute intrathoracic process. Electronically Signed   By: Randa Ngo M.D.   On: 07/09/2019 12:00   CT Angio Chest PE W/Cm &/Or Wo Cm  Result Date: 07/09/2019 CLINICAL DATA:  Dyspnea. EXAM: CT ANGIOGRAPHY CHEST WITH CONTRAST TECHNIQUE: Multidetector CT imaging of the chest was performed using the standard protocol during bolus administration of intravenous contrast. Multiplanar CT image reconstructions and MIPs were obtained to evaluate the vascular anatomy. CONTRAST:  65mL OMNIPAQUE IOHEXOL 350 MG/ML SOLN Automatic exposure control utilized. COMPARISON:  June 09, 2018 FINDINGS: Cardiovascular: Moderate cardiomegaly. Right atrial enlargement. Moderate three-vessel coronary calcification and severe LAD calcification. Thoracoabdominal aorta calcified atherosclerosis. 3.7 cm short segment saccular dilatation of the descending thoracic aorta over a 2 cm segment. 3.4 cm fusiform dilatation of the aortic arch. No contrast filling defect in the pulmonary trunk, right pulmonary artery, left pulmonary artery, or bilateral lobar pulmonary arteries. Respiratory motion artifact and contrast bolus timing preclude evaluation of the more distal segmental and subsegmental pulmonary arteries. Mediastinum/Nodes: No adenopathy. Lungs/Pleura: Central pulmonary vascular congestion without pulmonary edema or pneumonia or pleural effusion. Upper Abdomen: Mild medical renal disease. Splenule. Reflux of  contrast material into the hepatic veins. Musculoskeletal: Diffuse bone demineralization with vertebral planum deformity of T12 with mild retropulsion, unchanged. Review of the MIP  images confirms the above findings. IMPRESSION: 1. No pulmonary embolism in the central or bilateral lobar pulmonary arteries. No right heart strain. Respiratory motion artifact and contrast bolus timing preclude evaluation for more distal pulmonary embolism. 2. Moderate cardiomegaly with evidence of right heart failure and four-vessel moderate and severe coronary calcification. 3. A 3.7 cm short segment saccular dilatation of the descending thoracic aorta over a 2 cm segment, increased from 3.4 cm in February 2020. 4. A 3.4 cm fusiform dilatation of the aortic arch similar to February 2020. Recommend annual imaging followup by CTA or MRA. This recommendation follows 2010 ACCF/AHA/AATS/ACR/ASA/SCA/SCAI/SIR/STS/SVM Guidelines for the Diagnosis and Management of Patients with Thoracic Aortic Disease. Circulation.2010; [id number]. Aortic aneurysm NOS (ICD10-I71.9). 5. Incomplete imaging of upper abdominal aorta aneurysmal dilatation which measured 4.2 cm in February 2020. Recommend followup by abdomen and pelvis CTA in 6 months, and vascular surgery referral/consultation if not already obtained. This recommendation follows ACR consensus guidelines: White Paper of the ACR Incidental Findings Committee II on Vascular Findings. J Am Coll Radiol 2013; 10:789-794. Aortic aneurysm NOS (ICD10-I71.9) Aortic Atherosclerosis (ICD10-I70.0). Electronically Signed   By: Revonda Humphrey   On: 07/09/2019 16:11    EKG: Independently reviewed.  A. fib with rate of 87.  Assessment/Plan Principal Problem:   Acute diastolic CHF (congestive heart failure) (HCC) Active Problems:   Dyspnea   Hyperlipidemia   Essential hypertension   Osteoporosis   OSA (obstructive sleep apnea)   RA (rheumatoid arthritis) (HCC)   [name] (abdominal aortic aneurysm)  (HCC)   Chronic pain syndrome   Chronic a-fib (HCC)   Gait abnormality   Memory loss  1 acute diastolic CHF/right heart failure Patient presented with sudden onset shortness of breath, which per daughter woke her up at 4 AM.  Patient denies any chest pain.  Per daughter when EMS got the patient with sats of 98%.  Patient had presented to PCPs office and did complain of chest pain at PCPs office and was sent to the ED however on interview patient denies any ongoing chest pain.  BNP elevated at 219.6.  High-sensitivity troponin was 9 and repeat was 9.  EKG with A. fib with a rate of 87 with no ischemic changes noted.  D-dimer elevated however CT angiogram negative for PE but concerning for right heart failure.  Patient with a history of OSA and noncompliant with CPAP.  Check a 2D echo.  Placed on Lasix 20 mg IV every 12 hours.  Placed on ACE inhibitor, continue home regimen beta-blocker, aspirin.  Strict I's and O's.  Daily weights.  If 2D echo is abnormal will consult with cardiology.  Follow for now.  2.??  Questionable chest pain Per ED physician patient had complaints of chest pain at PCPs office.  Patient denies any ongoing chest pain.  Patient denies any chest pain on my assessment.  Patient with a TIMI risk for of approximately 4 due to age and comorbidities..  EKG with A. fib which is chronic with a rate of 87.  High-sensitivity troponin negative x2.  BNP of 219.6.  CT angiogram chest negative for PE however concerning for right heart failure and also does note four-vessel moderate and severe coronary calcification.  Check a 2D echo.  Continue home regimen beta-blocker aspirin.  Place on ACE inhibitor.  CPAP nightly.  If 2D echo is abnormal consult with cardiology.  Follow for now.  3.  OSA CPAP nightly.  4.  [name] We will need outpatient follow-up with vascular.  5.  Chronic atrial fibrillation Continue atenolol for rate control.  Patient deemed not a anticoagulation candidate due to history  of falls.  Continue aspirin.  6.  Hyperlipidemia Check a fasting lipid panel.  Continue home regimen statin.  7.  Hypertension Continue home regimen atenolol.  Patient on IV Lasix.  Low-dose ACE inhibitor.  8.  Neuropathy Resume home regimen of Neurontin.  9.  Chronic pain syndrome Resume home regimen of pain medications.    DVT prophylaxis: Lovenox Code Status: Full code.  Discussed with daughter at bedside. Family Communication: Updated patient and daughter at bedside. Disposition Plan: Likely home when clinically improved and back to baseline hopefully in the next 24 to 48 hours. Consults called: None Admission status: Place in observation.   Irine Seal MD [facility]  If 7PM-7AM, please contact night-coverage [email or web address] Use universal [place] password for that web site. If you do not have the password, please call the hospital operator.  07/09/2019, 6:07 PM

## 2019-07-09 NOTE — ED Provider Notes (Signed)
[facility] EMERGENCY DEPARTMENT Provider Note   CSN: [id number] Arrival date & time: 07/09/19  1104     History Chief Complaint  Patient presents with  . Chest Pain    [name] is a 86 y.o. female.  86yo F w/ PMH including A fib, dementia, [name], OSA, chronic pain syndrome, HTN, [name] who p/w CP and SOB. PT's son reports that they called EMS this morning at 4am due to her SOB. She was seen at PCP office where she was given 324mg  ASA and noted to be in A fib, sent here for eval. PCP note documents sudden onset of CP and SOB with some distress on exam there. Pt currently denies CP but is endorsing SOB. She denies cough.   LEVEL 5 CAVEAT DUE TO DEMENTIA  The history is provided by medical records and a relative. The history is limited by the condition of the patient.  Chest Pain      Past Medical History:  Diagnosis Date  . [name] (abdominal aortic aneurysm) ([place]) 09/2013   3.6 cm  . Acute and chronic respiratory failure with hypoxia ([place])   . Acute delirium   . Acute encephalopathy   . Acute pyelonephritis   . Atrial fibrillation ([place])   . Chronic pain syndrome   . Constipation   . Dementia ([place])   . Dependent edema    bilateral legs   . Depression   . Diabetes mellitus    pre-diabetes  . Diverticulosis   . Diverticulosis   . Dysuria   . E coli bacteremia   . Fall   . Gross hematuria   . Hydronephrosis with renal and ureteral calculous obstruction   . Hyperlipidemia   . Hypertension   . Hypoxemia   . Idiopathic peripheral autonomic neuropathy   . Infestation by bed bug   . Nephrolithiasis   . OSA on CPAP   . Osteoarthritis   . Osteopenia    T=  -2.1 in hip  . Persistent mood (affective) disorder, unspecified ([place])   . Prediabetes   . Rheumatoid arthritis ([place])   . Rheumatoid arthritis(714.0)   . S/P carpal tunnel release   . Sepsis secondary to UTI ([place])   . Spinal stenosis   . Unspecified fracture of fifth lumbar vertebra, initial  encounter for closed fracture ([place])   . Unspecified fracture of t11-T12 vertebra, initial encounter for closed fracture ([place])   . Urinary incontinence   . Urinary tract infection    hx of   . Urolithiasis     Patient Active Problem List   Diagnosis Date Noted  . Gait abnormality 01/24/2019  . Tremor 01/24/2019  . Memory loss 01/24/2019  . Atelectasis of left lung 04/06/2018  . Acute encephalopathy 02/09/2018  . Infestation by bed bug 02/03/2018  . Urinary tract infection due to extended-spectrum beta lactamase (ESBL) producing Escherichia coli 02/02/2018  . Chronic a-fib ([place]) 02/02/2018  . Constipation 01/31/2018  . Urolithiasis 01/30/2018  . Fall   . T12 compression fracture ([place]) 09/04/2017  . Fall at home, initial encounter 07/03/2017  . Tachycardia 02/03/2017  . Chronic pain syndrome 02/02/2017  . Abnormal EKG 11/21/2013  . [name] (abdominal aortic aneurysm) ([place]) 09/30/2013  . OSA (obstructive sleep apnea) 06/29/2012  . Dementia ([place]) 06/29/2012  . Pre-diabetes 06/29/2012  . Hypoxia, sleep related 06/29/2012  . RA (rheumatoid arthritis) ([place]) 06/29/2012  . Nonspecific abnormal results of cardiovascular function study 06/02/2011  . Nonspecific abnormal electrocardiogram (ECG) (EKG) 05/18/2011  .  OSTEOPOROSIS 04/09/2008  . Hyperlipidemia 05/12/2006  . DEPENDENT EDEMA, LEGS, BILATERAL 05/12/2006  . PERIPHERAL NEUROPATHY 03/11/2006  . Essential hypertension 03/11/2006    Past Surgical History:  Procedure Laterality Date  . APPENDECTOMY    . CARPAL TUNNEL RELEASE  12/27/2006   Right subcutaneous ulnar nerve transfer -- Right open carpal tunnel release  . CHOLECYSTECTOMY    . CYSTOSCOPY W/ URETERAL STENT PLACEMENT Right 01/30/2018   Procedure: CYSTOSCOPY WITH STENT REPLACEMENT retrograde pylegram;  Surgeon: Ardis Hughs, MD;  Location: [facility];  Service: Urology;  Laterality: Right;  . CYSTOSCOPY WITH URETEROSCOPY AND STENT PLACEMENT Right 03/01/2018   Procedure:  CYSTOSCPY/RIGHT URETEROSCOPY HOLMIUM LASER LITHOTRIPSY AND STENT EXCHANGE;  Surgeon: Ardis Hughs, MD;  Location: [facility];  Service: Urology;  Laterality: Right;  . FOOT SURGERY    . HOLMIUM LASER APPLICATION Right [id number]   Procedure: HOLMIUM LASER APPLICATION;  Surgeon: Ardis Hughs, MD;  Location: [facility];  Service: Urology;  Laterality: Right;  . JOINT REPLACEMENT     BTKR, RTHR  . TOTAL KNEE ARTHROPLASTY  08/21/2007   Right total knee replacement     OB History   No obstetric history on file.     Family History  Problem Relation Age of Onset  . Stroke Father   . Coronary artery disease Son        CABG at age 72  . Heart disease Son   . Heart attack Son   . Stroke Mother   . Hypertension Sister   . Diabetes Brother   . Hypertension Maternal Aunt     Social History   Tobacco Use  . Smoking status: Never Smoker  . Smokeless tobacco: Never Used  Substance Use Topics  . Alcohol use: No  . Drug use: No    Home Medications Prior to Admission medications   Medication Sig Start Date End Date Taking? Authorizing Provider  acetaminophen (TYLENOL) 500 MG tablet Take 500 mg by mouth every 8 (eight) hours as needed for mild pain.    Yes [provider]  ammonium lactate (AMLACTIN) 12 % cream Apply topically as needed for dry skin. 08/17/18  Yes Trula Slade, DPM  aspirin EC 81 MG tablet Take 81 mg by mouth daily.   Yes [provider]  atenolol (TENORMIN) 50 MG tablet TAKE 1 TABLET BY MOUTH DAILY Patient taking differently: Take 50 mg by mouth daily.  05/13/19  Yes [place], Modena Nunnery, MD  Calcium Citrate-Vitamin D (CALCIUM + D PO) Take 1 tablet by mouth daily. 600/400   Yes [provider]  Cholecalciferol 50 MCG (2000 UT) TABS Take by mouth.   Yes [provider]  diclofenac sodium (VOLTAREN) 1 % GEL Apply 2 g topically 4 (four) times daily. Rub into affected area of foot 2 to 4 times daily 08/17/18  Yes Trula Slade,  DPM  DULoxetine (CYMBALTA) 30 MG capsule Take 30 mg by mouth daily. Takes along with 60mg  for total daily dose of 90mg . 07/29/14  Yes [provider]  DULoxetine (CYMBALTA) 60 MG capsule Take 60 mg by mouth daily. Take along with 30mg  for a total daily dose of 90mg .   Yes [provider]  gabapentin (NEURONTIN) 800 MG tablet Take 800-1,600 mg by mouth as directed. 800 mg in the morning, 800 mg at noon, 1600 mg at night 03/26/18  Yes [provider]  oxyCODONE-acetaminophen (PERCOCET) 7.5-325 MG tablet Take 1 tablet by mouth every 8 (eight) hours as needed  for severe pain. 04/26/18  Yes [name], MD  pantoprazole (PROTONIX) 40 MG tablet TAKE 1 TABLET BY MOUTH DAILY Patient taking differently: Take 40 mg by mouth daily.  03/25/19  Yes Palmer, Modena Nunnery, MD  rosuvastatin (CRESTOR) 40 MG tablet Take 1 tablet (40 mg total) by mouth every evening. 01/02/19  Yes [name], MD  clotrimazole-betamethasone (LOTRISONE) cream Apply 1 application topically 2 (two) times daily. Patient not taking: Reported on 07/09/2019 06/27/19   [name], MD  nitrofurantoin, macrocrystal-monohydrate, (MACROBID) 100 MG capsule Take 1 capsule (100 mg total) by mouth 2 (two) times daily for 7 days. Patient not taking: Reported on 07/09/2019 07/02/19 07/09/19  [name], MD    Allergies    Sulfonamide derivatives  Review of Systems   Review of Systems  Unable to perform ROS: Dementia  Cardiovascular: Positive for chest pain.    Physical Exam Updated Vital Signs BP 133/84   Pulse 80   Temp 98.8 F (37.1 C) (Oral)   Resp (!) 21   SpO2 99%   Physical Exam Vitals and nursing note reviewed.  Constitutional:      General: She is not in acute distress.    Appearance: She is well-developed.  HENT:     Head: Normocephalic and atraumatic.  Eyes:     Conjunctiva/sclera: Conjunctivae normal.  Cardiovascular:     Rate and Rhythm: Normal rate. Rhythm irregular.     Heart  sounds: Normal heart sounds. No murmur.  Pulmonary:     Effort: No respiratory distress.     Comments: Mildly increased WOB with scattered wheezes, crackles in b/l bases Abdominal:     General: Bowel sounds are normal. There is no distension.     Palpations: Abdomen is soft.     Tenderness: There is no abdominal tenderness.  Musculoskeletal:     Cervical back: Neck supple.     Comments: Trace BLE edema  Skin:    General: Skin is warm and dry.  Neurological:     Mental Status: She is alert.     Comments: Fluent speech A&O x 1  Psychiatric:        Behavior: Behavior normal.        Cognition and Memory: Memory is impaired.     ED Results / Procedures / Treatments   Labs (all labs ordered are listed, but only abnormal results are displayed) Labs Reviewed  BASIC METABOLIC PANEL - Abnormal; Notable for the following components:      Result Value   Sodium 133 (*)    Chloride 97 (*)    Glucose, Bld 115 (*)    GFR calc non Af Amer 53 (*)    All other components within normal limits  BRAIN NATRIURETIC PEPTIDE - Abnormal; Notable for the following components:   B Natriuretic Peptide 219.6 (*)    All other components within normal limits  HEPATIC FUNCTION PANEL - Abnormal; Notable for the following components:   Total Bilirubin 1.4 (*)    Indirect Bilirubin 1.2 (*)    All other components within normal limits  D-DIMER, QUANTITATIVE (NOT AT [facility]) - Abnormal; Notable for the following components:   D-Dimer, Quant 0.93 (*)    All other components within normal limits  CBG MONITORING, ED - Abnormal; Notable for the following components:   Glucose-Capillary 107 (*)    All other components within normal limits  SARS CORONAVIRUS 2 (TAT 6-24 HRS)  CBC  TROPONIN I (HIGH SENSITIVITY)  TROPONIN I (  HIGH SENSITIVITY)    EKG EKG Interpretation  Date/Time:  Tuesday July 09 2019 11:08:27 EST Ventricular Rate:  87 PR Interval:    QRS Duration: 68 QT Interval:  364 QTC  Calculation: 438 R Axis:   15 Text Interpretation: Atrial fibrillation Low voltage QRS Abnormal ECG A fib similar to previous Confirmed by Theotis Burrow [phone]) on 07/09/2019 12:37:26 PM   Radiology DG Chest 2 View  Result Date: 07/09/2019 CLINICAL DATA:  Chest pain, short of breath EXAM: CHEST - 2 VIEW COMPARISON:  06/08/2018 FINDINGS: Frontal and lateral views of the chest demonstrate stable enlargement the cardiac silhouette. There is marked ectasia of the thoracic aorta with diffuse atherosclerosis, not appreciably changed since previous exam. No airspace disease, effusion, or pneumothorax. No acute bony abnormalities. Chronic T12 vertebral plana. IMPRESSION: 1. Stable atherosclerosis and ectasia of the thoracic aorta. 2. No acute intrathoracic process. Electronically Signed   By: Randa Ngo M.D.   On: 07/09/2019 12:00   CT Angio Chest PE W/Cm &/Or Wo Cm  Result Date: 07/09/2019 CLINICAL DATA:  Dyspnea. EXAM: CT ANGIOGRAPHY CHEST WITH CONTRAST TECHNIQUE: Multidetector CT imaging of the chest was performed using the standard protocol during bolus administration of intravenous contrast. Multiplanar CT image reconstructions and MIPs were obtained to evaluate the vascular anatomy. CONTRAST:  62mL OMNIPAQUE IOHEXOL 350 MG/ML SOLN Automatic exposure control utilized. COMPARISON:  June 09, 2018 FINDINGS: Cardiovascular: Moderate cardiomegaly. Right atrial enlargement. Moderate three-vessel coronary calcification and severe LAD calcification. Thoracoabdominal aorta calcified atherosclerosis. 3.7 cm short segment saccular dilatation of the descending thoracic aorta over a 2 cm segment. 3.4 cm fusiform dilatation of the aortic arch. No contrast filling defect in the pulmonary trunk, right pulmonary artery, left pulmonary artery, or bilateral lobar pulmonary arteries. Respiratory motion artifact and contrast bolus timing preclude evaluation of the more distal segmental and subsegmental pulmonary arteries.  Mediastinum/Nodes: No adenopathy. Lungs/Pleura: Central pulmonary vascular congestion without pulmonary edema or pneumonia or pleural effusion. Upper Abdomen: Mild medical renal disease. Splenule. Reflux of contrast material into the hepatic veins. Musculoskeletal: Diffuse bone demineralization with vertebral planum deformity of T12 with mild retropulsion, unchanged. Review of the MIP images confirms the above findings. IMPRESSION: 1. No pulmonary embolism in the central or bilateral lobar pulmonary arteries. No right heart strain. Respiratory motion artifact and contrast bolus timing preclude evaluation for more distal pulmonary embolism. 2. Moderate cardiomegaly with evidence of right heart failure and four-vessel moderate and severe coronary calcification. 3. A 3.7 cm short segment saccular dilatation of the descending thoracic aorta over a 2 cm segment, increased from 3.4 cm in February 2020. 4. A 3.4 cm fusiform dilatation of the aortic arch similar to February 2020. Recommend annual imaging followup by CTA or MRA. This recommendation follows 2010 ACCF/AHA/AATS/ACR/ASA/SCA/SCAI/SIR/STS/SVM Guidelines for the Diagnosis and Management of Patients with Thoracic Aortic Disease. Circulation.2010; [id number]. Aortic aneurysm NOS (ICD10-I71.9). 5. Incomplete imaging of upper abdominal aorta aneurysmal dilatation which measured 4.2 cm in February 2020. Recommend followup by abdomen and pelvis CTA in 6 months, and vascular surgery referral/consultation if not already obtained. This recommendation follows ACR consensus guidelines: White Paper of the ACR Incidental Findings Committee II on Vascular Findings. J Am Coll Radiol 2013; 10:789-794. Aortic aneurysm NOS (ICD10-I71.9) Aortic Atherosclerosis (ICD10-I70.0). Electronically Signed   By: Revonda Humphrey   On: 07/09/2019 16:11    Procedures Procedures (including critical care time)  Medications Ordered in ED Medications  nitroGLYCERIN (NITROSTAT) SL tablet 0.4  mg (0.4 mg Sublingual Given 07/09/19 1331)  iohexol (OMNIPAQUE) 350 MG/ML injection 80 mL (80 mLs Intravenous Contrast Given 07/09/19 1530)    ED Course  I have reviewed the triage vital signs and the nursing notes.  Pertinent labs & imaging results that were available during my care of the patient were reviewed by me and considered in my medical decision making (see chart for details).    MDM Rules/Calculators/A&P                      Patient was dyspneic on exam but no respiratory distress.  Lab work shows negative serial troponins, D-dimer 0.93, normal creatinine, reassuring CBC.  Chest x-ray negative acute.  Obtain CTA of chest which was negative for PE.  She does have known areas of dilation of aorta.  CT also noted cardiomegaly, right heart failure, and significant coronary calcification.  I discussed these findings over the phone with the patient's son, with whom she lives.  I discussed treatment options including admission for further cardiac work-up versus discharge with cardiology clinic follow-up.  He wants the patient admitted for further work-up.  Discussed with [facility], Dr. Grandville Silos.  Final Clinical Impression(s) / ED Diagnoses Final diagnoses:  None    Rx / DC Orders ED Discharge Orders    None       Kai Railsback, Wenda Overland, MD 07/09/19 1707

## 2019-07-09 NOTE — ED Notes (Signed)
Pt's daughter Learta Codding updated on pt status.

## 2019-07-09 NOTE — Progress Notes (Signed)
Subjective:    Patient ID: [name], female    DOB: 25-Nov-1933, 86 y.o.   MRN: [id number]  HPI  Patient is an 86 year old Caucasian female who developed severe substernal chest pain this morning around 4 AM.  [name] also has shortness of breath.  Apparently an ambulance was called.  As best I can tell from the history her oxygen saturation was normal and [name] made an appointment this morning to come to my office.  This morning [name] is unable to provide any history.  [name] is in the exam room panting.  Oxygen saturation is normal however [name] is more confused and has a difficult time telling me any history at all.  [name] is clutching the center of her chest saying that her chest hurts.  [name] is also stating that [name] cannot breathe of note, the patient was recently diagnosed with a urinary tract infection that was resistant to antibiotics except for Macrobid.  [name] had just started the Macrobid earlier this week. Past Medical History:  Diagnosis Date  . [name] (abdominal aortic aneurysm) ([place]) 09/2013   3.6 cm  . Acute and chronic respiratory failure with hypoxia ([place])   . Acute delirium   . Acute encephalopathy   . Acute pyelonephritis   . Atrial fibrillation ([place])   . Chronic pain syndrome   . Constipation   . Dementia ([place])   . Dependent edema    bilateral legs   . Depression   . Diabetes mellitus    pre-diabetes  . Diverticulosis   . Diverticulosis   . Dysuria   . E coli bacteremia   . Fall   . Gross hematuria   . Hydronephrosis with renal and ureteral calculous obstruction   . Hyperlipidemia   . Hypertension   . Hypoxemia   . Idiopathic peripheral autonomic neuropathy   . Infestation by bed bug   . Nephrolithiasis   . OSA on CPAP   . Osteoarthritis   . Osteopenia    T=  -2.1 in hip  . Persistent mood (affective) disorder, unspecified ([place])   . Prediabetes   . Rheumatoid arthritis ([place])   . Rheumatoid arthritis(714.0)   . S/P carpal tunnel release   . Sepsis secondary to UTI  ([place])   . Spinal stenosis   . Unspecified fracture of fifth lumbar vertebra, initial encounter for closed fracture ([place])   . Unspecified fracture of t11-T12 vertebra, initial encounter for closed fracture ([place])   . Urinary incontinence   . Urinary tract infection    hx of   . Urolithiasis    Past Surgical History:  Procedure Laterality Date  . APPENDECTOMY    . CARPAL TUNNEL RELEASE  12/27/2006   Right subcutaneous ulnar nerve transfer -- Right open carpal tunnel release  . CHOLECYSTECTOMY    . CYSTOSCOPY W/ URETERAL STENT PLACEMENT Right 01/30/2018   Procedure: CYSTOSCOPY WITH STENT REPLACEMENT retrograde pylegram;  Surgeon: Ardis Hughs, MD;  Location: [facility];  Service: Urology;  Laterality: Right;  . CYSTOSCOPY WITH URETEROSCOPY AND STENT PLACEMENT Right 03/01/2018   Procedure: CYSTOSCPY/RIGHT URETEROSCOPY HOLMIUM LASER LITHOTRIPSY AND STENT EXCHANGE;  Surgeon: Ardis Hughs, MD;  Location[facility];  Service: Urology;  Laterality: Right;  . FOOT SURGERY    . HOLMIUM LASER APPLICATION Right [id number]   Procedure: HOLMIUM LASER APPLICATION;  Surgeon: Ardis Hughs, MD;  [facility];  Service: Urology;  Laterality: Right;  . JOINT REPLACEMENT     BTKR, RTHR  . TOTAL  KNEE ARTHROPLASTY  08/21/2007   Right total knee replacement   Current Outpatient Medications on File Prior to Visit  Medication Sig Dispense Refill  . acetaminophen (TYLENOL) 500 MG tablet Take 500 mg by mouth every 8 (eight) hours as needed for mild pain.     Marland Kitchen ammonium lactate (AMLACTIN) 12 % cream Apply topically as needed for dry skin. 385 g 0  . aspirin EC 81 MG tablet Take 81 mg by mouth daily.    Marland Kitchen atenolol (TENORMIN) 50 MG tablet TAKE 1 TABLET BY MOUTH DAILY 90 tablet 3  . Calcium Citrate-Vitamin D (CALCIUM + D PO) Take 1 tablet by mouth daily. 600/400    . Cholecalciferol 50 MCG (2000 UT) TABS Take by mouth.    . clotrimazole-betamethasone (LOTRISONE) cream Apply 1 application topically  2 (two) times daily. 45 g 0  . diclofenac sodium (VOLTAREN) 1 % GEL Apply 2 g topically 4 (four) times daily. Rub into affected area of foot 2 to 4 times daily 100 g 2  . DULoxetine (CYMBALTA) 30 MG capsule Take 30 mg by mouth daily. Takes along with 60mg  for total daily dose of 90mg .  2  . DULoxetine (CYMBALTA) 60 MG capsule Take 60 mg by mouth daily. Take along with 30mg  for a total daily dose of 90mg .    . gabapentin (NEURONTIN) 800 MG tablet Take 800-1,600 mg by mouth as directed. 800 mg in the morning, 800 mg at noon, 1600 mg at night    . nitrofurantoin, macrocrystal-monohydrate, (MACROBID) 100 MG capsule Take 1 capsule (100 mg total) by mouth 2 (two) times daily for 7 days. 14 capsule 0  . oxyCODONE-acetaminophen (PERCOCET) 7.5-325 MG tablet Take 1 tablet by mouth every 8 (eight) hours as needed for severe pain. 90 tablet 0  . pantoprazole (PROTONIX) 40 MG tablet TAKE 1 TABLET BY MOUTH DAILY 30 tablet 3  . rosuvastatin (CRESTOR) 40 MG tablet Take 1 tablet (40 mg total) by mouth every evening. 90 tablet 3   No current facility-administered medications on file prior to visit.   Allergies  Allergen Reactions  . Sulfonamide Derivatives Hives and Rash   Social History   Socioeconomic History  . Marital status: Divorced    Spouse name: Not on file  . Number of children: 6  . Years of education: 8th grade  . Highest education level: Not on file  Occupational History  . Occupation: Retired  Tobacco Use  . Smoking status: Never Smoker  . Smokeless tobacco: Never Used  Substance and Sexual Activity  . Alcohol use: No  . Drug use: No  . Sexual activity: Not on file  Other Topics Concern  . Not on file  Social History Narrative   Lives at home with her sons.   [name] has four living children.   Right-handed.   Two cups caffeine per day.   Social Determinants of Health   Financial Resource Strain:   . Difficulty of Paying Living Expenses: Not on file  Food Insecurity:   . Worried  About Charity fundraiser in the Last Year: Not on file  . Ran Out of Food in the Last Year: Not on file  Transportation Needs:   . Lack of Transportation (Medical): Not on file  . Lack of Transportation (Non-Medical): Not on file  Physical Activity:   . Days of Exercise per Week: Not on file  . Minutes of Exercise per Session: Not on file  Stress:   . Feeling of Stress :  Not on file  Social Connections:   . Frequency of Communication with Friends and Family: Not on file  . Frequency of Social Gatherings with Friends and Family: Not on file  . Attends Religious Services: Not on file  . Active Member of Clubs or Organizations: Not on file  . Attends Archivist Meetings: Not on file  . Marital Status: Not on file  Intimate Partner Violence:   . Fear of Current or Ex-Partner: Not on file  . Emotionally Abused: Not on file  . Physically Abused: Not on file  . Sexually Abused: Not on file     Review of Systems  All other systems reviewed and are negative.      Objective:   Physical Exam Constitutional:      General: [name] is in acute distress.     Appearance: [name] is not ill-appearing, toxic-appearing or diaphoretic.  Cardiovascular:     Rate and Rhythm: Normal rate. Rhythm irregular.  Pulmonary:     Effort: Tachypnea present.     Breath sounds: Rales present.  Chest:     Chest wall: No tenderness or edema.  Musculoskeletal:     Right lower leg: No edema.     Left lower leg: No edema.  Neurological:     Mental Status: [name] is alert.           Assessment & Plan:  Chest pain, unspecified type - Plan: EKG 12-Lead  Patient walks in today complaining of sudden onset of substernal chest pain and shortness of breath.  [name] appears to be in moderate distress.  EKG is obtained immediately.  Patient did receive 481 mg aspirin upon arrival.  [name] did not receive nitroglycerin as the patient states that the chest pain has now passed.  [name] is in atrial fibrillation.  There  is potential T wave inversions in the inferior leads however I am unable to tell if this is chronic or new.  However the patient does have bibasilar crackles consistent with possible pulmonary edema versus pneumonia.  EMS was called.  I am unable to tell the patient has pulmonary edema versus bilateral pneumonia however I believe [name] needs evaluation in the emergency room given her chest pain and shortness of breath.

## 2019-07-09 NOTE — ED Notes (Signed)
This RN messaged pharmacy to send Voltaren. Will administer when arrives

## 2019-07-09 NOTE — ED Notes (Signed)
Help get patient on the bedpan patient is now off and resting with call bell in reach got patient warm blankets

## 2019-07-09 NOTE — ED Notes (Signed)
Lab notified to add on BNP and hepatic function

## 2019-07-09 NOTE — ED Notes (Signed)
Tele  [name] daughter [phone] looking for an update on pt

## 2019-07-09 NOTE — ED Notes (Signed)
Placed external cath on patient pt. Is resting with call bell in reach and family at bedside

## 2019-07-09 NOTE — ED Notes (Signed)
Pt care endorsed to [facility].

## 2019-07-10 ENCOUNTER — Observation Stay (HOSPITAL_BASED_OUTPATIENT_CLINIC_OR_DEPARTMENT_OTHER): Payer: Medicare Other

## 2019-07-10 DIAGNOSIS — R0602 Shortness of breath: Secondary | ICD-10-CM

## 2019-07-10 DIAGNOSIS — I5031 Acute diastolic (congestive) heart failure: Secondary | ICD-10-CM

## 2019-07-10 DIAGNOSIS — I110 Hypertensive heart disease with heart failure: Secondary | ICD-10-CM | POA: Diagnosis not present

## 2019-07-10 LAB — ECHOCARDIOGRAM COMPLETE
Height: 62 in
Weight: 2888.91 oz

## 2019-07-10 LAB — LIPID PANEL
Cholesterol: 86 mg/dL (ref 0–200)
HDL: 30 mg/dL — ABNORMAL LOW (ref >40)
LDL Cholesterol: 32 mg/dL (ref 0–99)
Total CHOL/HDL Ratio: 2.9 RATIO
Triglycerides: 118 mg/dL (ref <150)
VLDL: 24 mg/dL (ref 0–40)

## 2019-07-10 LAB — BASIC METABOLIC PANEL
Anion gap: 12 (ref 5–15)
BUN: 22 mg/dL (ref 8–23)
CO2: 26 mmol/L (ref 22–32)
Calcium: 9.2 mg/dL (ref 8.9–10.3)
Chloride: 95 mmol/L — ABNORMAL LOW (ref 98–111)
Creatinine, Ser: 1.22 mg/dL — ABNORMAL HIGH (ref 0.44–1.00)
GFR calc Af Amer: 47 mL/min — ABNORMAL LOW (ref >60)
GFR calc non Af Amer: 41 mL/min — ABNORMAL LOW (ref >60)
Glucose, Bld: 95 mg/dL (ref 70–99)
Potassium: 3.8 mmol/L (ref 3.5–5.1)
Sodium: 133 mmol/L — ABNORMAL LOW (ref 135–145)

## 2019-07-10 LAB — MRSA PCR SCREENING: MRSA by PCR: NEGATIVE

## 2019-07-10 LAB — BRAIN NATRIURETIC PEPTIDE: B Natriuretic Peptide: 181.6 pg/mL — ABNORMAL HIGH (ref 0.0–100.0)

## 2019-07-10 NOTE — Evaluation (Signed)
Occupational Therapy Evaluation Patient Details Name: [name] MRN: [id number] DOB: 16-Dec-1933 Today's Date: 07/10/2019    History of Present Illness 86 y.o. female with medical history significant of chronic atrial fibrillation, [name], OSA not currently on CPAP at home per daughter, hypertension, chronic pain syndrome, hyperlipidemia, dementia who presents to the ED with several hour history of shortness of breath   Clinical Impression   Pt PTA:Pt living with family 24/7 care; pt ambulating with rollator at home; ADL with HHA for bathing and dressing. M-F 3 hours daily. Pt currently able to navigate through room with RW and obstacles; pt stood at sink tolerating grooming tasks with minguardA and minA with self care. Pt's daughter in room to review any energy conservation techniques. Pt would benefit from continued OT skilled services in [facility] setting. No acute OT required. OT signing off.     Follow Up Recommendations  Home health OT;Supervision - Intermittent    Equipment Recommendations  None recommended by OT    Recommendations for Other Services       Precautions / Restrictions Precautions Precautions: Fall Restrictions Weight Bearing Restrictions: No      Mobility Bed Mobility Overal bed mobility: Needs Assistance Bed Mobility: Supine to Sit     Supine to sit: Mod assist     General bed mobility comments: in recliner upon arrival  Transfers Overall transfer level: Needs assistance Equipment used: 4-wheeled walker Transfers: Sit to/from Stand Sit to Stand: Min guard;Min assist         General transfer comment: minguardA to minA for power up from commode x1 and recliner x1     Balance Overall balance assessment: Needs assistance Sitting-balance support: Feet supported Sitting balance-Leahy Scale: Fair Sitting balance - Comments: close supervision without back support   Standing balance support: Bilateral upper extremity supported Standing  balance-Leahy Scale: Fair Standing balance comment: minguardA with RW                           ADL either performed or assessed with clinical judgement   ADL Overall ADL's : Needs assistance/impaired Eating/Feeding: Set up;Sitting   Grooming: Min guard;Standing Grooming Details (indicate cue type and reason): stood at sink for grooming ~6 mins Upper Body Bathing: Supervision/ safety;Sitting   Lower Body Bathing: Min guard;Sitting/lateral leans;Sit to/from stand;Cueing for safety   Upper Body Dressing : Set up;Sitting   Lower Body Dressing: Min guard;Sitting/lateral leans;Sit to/from stand;Cueing for safety   Toilet Transfer: Min guard;RW;Ambulation;Cueing for safety   Toileting- Clothing Manipulation and Hygiene: Minimal assistance;Cueing for safety;Sitting/lateral lean;Sit to/from stand Toileting - Clothing Manipulation Details (indicate cue type and reason): stood with RW and assisted due to IV causing RUE to hurt      Functional mobility during ADLs: Min guard;Rolling walker;Cueing for safety General ADL Comments: Pt able to navigate through room with RW and obstacles; pt stood at sink tolerating grooming tasks well. Pt's daughter in room to review any energy conservation techniques.     Vision Baseline Vision/History: No visual deficits Patient Visual Report: No change from baseline Vision Assessment?: No apparent visual deficits     Perception     Praxis      Pertinent Vitals/Pain Pain Assessment: Faces Faces Pain Scale: Hurts little more Pain Location: back Pain Descriptors / Indicators: Aching;Discomfort;Sore Pain Intervention(s): Limited activity within patient's tolerance;Monitored during session     Hand Dominance Right   Extremity/Trunk Assessment Upper Extremity Assessment Upper Extremity Assessment: Generalized weakness  Lower Extremity Assessment Lower Extremity Assessment: Generalized weakness   Cervical / Trunk Assessment Cervical /  Trunk Assessment: Kyphotic   Communication Communication Communication: No difficulties   Cognition Arousal/Alertness: Lethargic Behavior During Therapy: WFL for tasks assessed/performed Overall Cognitive Status: Impaired/Different from baseline Area of Impairment: Orientation;Memory;Following commands                 Orientation Level: Disoriented to;Situation;Time;Place   Memory: Decreased short-term memory Following Commands: Follows one step commands with increased time       General Comments: pt followed commands   General Comments  Pt requiring 2L [place] with exertion; desatting to 85% and requiring seated rest break ~20 secs to recover with pursed lip breathing.    Exercises     Shoulder Instructions      Home Living Family/patient expects to be discharged to:: Private residence Living Arrangements: Children Available Help at Discharge: Family;Available 24 hours/day(11-1128) Type of Home: Mobile home Home Access: Ramped entrance     Home Layout: One level     Bathroom Shower/Tub: Chief Strategy Officer: Standard Bathroom Accessibility: Yes How Accessible: Accessible via walker Home Equipment: Walker - 4 wheels;Wheelchair - [employer];Hospital bed   Additional Comments: confirmed by daughter info from above. lives with 2 of her sons and other family      Prior Functioning/Environment Level of Independence: Needs assistance  Gait / Transfers Assistance Needed: pt ambulates short household distances with use of Rollator ADL's / Homemaking Assistance Needed: assistance from aide for bathing and dressing            OT Problem List: Decreased activity tolerance      OT Treatment/Interventions:      OT Goals(Current goals can be found in the care plan section) Acute Rehab OT Goals Patient Stated Goal: To improve mobility OT Goal Formulation: With patient Potential to Achieve Goals: Good  OT Frequency:     Barriers to D/C:             Co-evaluation              AM-PAC OT "6 Clicks" Daily Activity     Outcome Measure Help from another person eating meals?: None Help from another person taking care of personal grooming?: A Little Help from another person toileting, which includes using toliet, bedpan, or urinal?: A Little Help from another person bathing (including washing, rinsing, drying)?: A Little Help from another person to put on and taking off regular upper body clothing?: A Little Help from another person to put on and taking off regular lower body clothing?: A Lot 6 Click Score: 18   End of Session Equipment Utilized During Treatment: Gait belt;Rolling walker;Oxygen Nurse Communication: Mobility status  Activity Tolerance: Patient limited by fatigue Patient left: in chair;with call bell/phone within reach;with chair alarm set;with family/visitor present  OT Visit Diagnosis: Unsteadiness on feet (R26.81);Muscle weakness (generalized) (M62.81);Pain Pain - part of body: (back)                Time: 6962-9528 OT Time Calculation (min): 49 min Charges:  OT General Charges $OT Visit: 1 Visit OT Evaluation $OT Eval Moderate Complexity: 1 Mod OT Treatments $Self Care/Home Management : 8-22 mins $Therapeutic Activity: 8-22 mins  Flora Lipps, OTR/L Acute Rehabilitation Services Pager: [phone] Office: [phone]   Hien Cunliffe C 07/10/2019, 1:01 PM

## 2019-07-10 NOTE — Evaluation (Signed)
Physical Therapy Evaluation Patient Details Name: [name] MRN: [id number] DOB: 17-Nov-1933 Today's Date: 07/10/2019   History of Present Illness  86 y.o. female with medical history significant of chronic atrial fibrillation, [name], OSA not currently on CPAP at home per daughter, hypertension, chronic pain syndrome, hyperlipidemia, dementia who presents to the ED with several hour history of shortness of breath  Clinical Impression  Pt presents to PT with deficits in functional mobility, gait, balance, endurance, strength, power, cognition. Pt is confused during session and will require history to be confirmed by family (portions of history taken from chart in 2019). Pt requiress assistance for all mobility at this time to reduce falls risk, lacking LE power and balance to safely complete transfers. Pt will benefit from aggressive mobilization to improve balance, endurance, and LE power, in order to reduce falls risk. PT currently recommending SNF at this time due to high falls risk andf questionable family support, pt reporting he son that is available 24/7 just fad foot surgery.    Follow Up Recommendations SNF;Supervision/Assistance - 24 hour(may progress)    Equipment Recommendations  (DME needs to be confirmed with family)    Recommendations for Other Services       Precautions / Restrictions Precautions Precautions: Fall Restrictions Weight Bearing Restrictions: No      Mobility  Bed Mobility Overal bed mobility: Needs Assistance Bed Mobility: Supine to Sit     Supine to sit: Mod assist        Transfers Overall transfer level: Needs assistance Equipment used: 4-wheeled walker Transfers: Sit to/from Stand Sit to Stand: Min assist            Ambulation/Gait Ambulation/Gait assistance: Min Web designer (Feet): 30 Feet Assistive device: 4-wheeled walker Gait Pattern/deviations: Step-to pattern;Wide base of support Gait velocity: reduced Gait  velocity interpretation: <1.8 ft/sec, indicate of risk for recurrent falls General Gait Details: pt with short step to gait with widened BOS, slight increase in lateral sway and drift  Stairs            Wheelchair Mobility    Modified Rankin (Stroke Patients Only)       Balance Overall balance assessment: Needs assistance Sitting-balance support: Feet supported;Single extremity supported Sitting balance-Leahy Scale: Fair Sitting balance - Comments: close supervision without back support   Standing balance support: Bilateral upper extremity supported Standing balance-Leahy Scale: Fair Standing balance comment: minG with BUE support of Rollator, minA with hand hold                             Pertinent Vitals/Pain Pain Assessment: Faces Faces Pain Scale: Hurts even more Pain Location: arms at IV sites Pain Descriptors / Indicators: Aching Pain Intervention(s): Limited activity within patient's tolerance    Home Living Family/patient expects to be discharged to:: Private residence Living Arrangements: Children Available Help at Discharge: Family;Available 24 hours/day(aide monday-friday 3-630) Type of Home: Mobile home Home Access: Ramped entrance     Home Layout: One level Home Equipment: [place] - 4 wheels;Wheelchair - [employer];Hospital bed Additional Comments: lives 2 of her sons    Prior Function Level of Independence: Needs assistance   Gait / Transfers Assistance Needed: pt ambulates short household distances with use of Rollator  ADL's / Homemaking Assistance Needed: assistance from aide for bathing and dressing        Hand Dominance   Dominant Hand: Right    Extremity/Trunk Assessment   Upper Extremity Assessment  Upper Extremity Assessment: Generalized weakness    Lower Extremity Assessment Lower Extremity Assessment: Generalized weakness    Cervical / Trunk Assessment Cervical / Trunk Assessment: Kyphotic   Communication   Communication: No difficulties  Cognition Arousal/Alertness: Awake/alert Behavior During Therapy: WFL for tasks assessed/performed Overall Cognitive Status: Impaired/Different from baseline Area of Impairment: Orientation;Memory;Following commands                 Orientation Level: Disoriented to;Situation;Time;Place   Memory: Decreased short-term memory Following Commands: Follows one step commands with increased time              General Comments General comments (skin integrity, edema, etc.): pt on 2L [place] initially saturating in mid to high 90s. PT weans to room air for mobility and pt desaturates to 85% during ambulation. Pt recovers quickly once seated and returned to 2L [place]    Exercises     Assessment/Plan    PT Assessment Patient needs continued PT services  PT Problem List Decreased strength;Decreased activity tolerance;Decreased balance;Decreased mobility;Decreased knowledge of use of DME;Decreased safety awareness;Decreased knowledge of precautions;Cardiopulmonary status limiting activity;Pain       PT Treatment Interventions DME instruction;Gait training;Therapeutic activities;Functional mobility training;Therapeutic exercise;Balance training;Neuromuscular re-education;Patient/family education    PT Goals (Current goals can be found in the Care Plan section)  Acute Rehab PT Goals Patient Stated Goal: To improve mobility PT Goal Formulation: With patient Time For Goal Achievement: 07/24/19 Potential to Achieve Goals: Good Additional Goals Additional Goal #1: Pt will maintain dynamic standing balance within 6 inches of her base of support with unilateral UE support of the LRAD and supervision.    Frequency Min 3X/week(3x until SNF confirmed)   Barriers to discharge        Co-evaluation               AM-PAC PT "6 Clicks" Mobility  Outcome Measure Help needed turning from your back to your side while in a flat bed without using  bedrails?: A Little Help needed moving from lying on your back to sitting on the side of a flat bed without using bedrails?: A Lot Help needed moving to and from a bed to a chair (including a wheelchair)?: A Little Help needed standing up from a chair using your arms (e.g., wheelchair or bedside chair)?: A Little Help needed to walk in hospital room?: A Little Help needed climbing 3-5 steps with a railing? : A Lot 6 Click Score: 16    End of Session Equipment Utilized During Treatment: Oxygen Activity Tolerance: Patient limited by fatigue Patient left: in chair;with call bell/phone within reach;with chair alarm set Nurse Communication: Mobility status PT Visit Diagnosis: Muscle weakness (generalized) (M62.81)    Time: [id number] PT Time Calculation (min) (ACUTE ONLY): 30 min   Charges:   PT Evaluation $PT Eval Moderate Complexity: 1 Mod PT Treatments $Therapeutic Activity: 8-22 mins        Zenaida Niece, PT, DPT Acute Rehabilitation Pager: [phone]   Zenaida Niece 07/10/2019, 9:54 AM

## 2019-07-10 NOTE — ED Notes (Signed)
Pt given to [facility] RN. All questions answered

## 2019-07-10 NOTE — Plan of Care (Signed)
  Problem: Clinical Measurements: Goal: Ability to maintain clinical measurements within normal limits will improve Outcome: Progressing   Problem: Coping: Goal: Level of anxiety will decrease Outcome: Progressing   

## 2019-07-10 NOTE — Discharge Summary (Signed)
Physician Discharge Summary  [name] [id number] DOB: 12-23-1933 DOA: 07/09/2019  PCP: [name], MD  Admit date: 07/09/2019 Discharge date: 07/10/2019  Admitted From: Home Discharge disposition: Home    Recommendations for Outpatient Follow-Up:   1. SLP recommending an outpatient modified barium swallow test 2. Encourage use of incentive spirometer 3. May need as needed Lasix 4. Consider community palliative care follow-up 5. Outpatient vascular follow-up for [name] if desired   Discharge Diagnosis:   Principal Problem:   Acute diastolic CHF (congestive heart failure) (HCC) Active Problems:   Hyperlipidemia   Essential hypertension   Osteoporosis   OSA (obstructive sleep apnea)   RA (rheumatoid arthritis) (HCC)   [name] (abdominal aortic aneurysm) (HCC)   Chronic pain syndrome   Chronic a-fib (HCC)   Gait abnormality   Memory loss   Dyspnea    Discharge Condition: Improved.  Diet recommendation: Low sodium, heart healthy  Wound care: None.  Code status: Full.   History of Present Illness:   [name] is a 86 y.o. female with medical history significant of chronic atrial fibrillation, [name], OSA not currently on CPAP at home per daughter, hypertension, chronic pain syndrome, hyperlipidemia, dementia (however daughter denies this) who presents to the ED with several hour history of shortness of breath.  Daughter at bedside and most of the history obtained from the daughter.  Daughter does state that EMS was called around 4:30 AM due to patient's shortness of breath.  Per daughter when EMS got there patient sats was 98% on room air, vital signs stable and as such family decided to take patient to his scheduled PCPs appointment on the day of admission.  Per daughter when patient got to PCPs office patient had some complaints of chest pain noted to be in chronic A. fib and subsequently sent to the ED.  Patient with complaints of diffuse pain all over  all although has chronic pain syndrome.  Patient at my interview denies any ongoing chest pain.  Patient denies any fevers, no chills, no nausea, no vomiting, no abdominal pain, no diarrhea, no constipation, no melena, no hematemesis, no hematochezia, no lightheadedness, no syncopal episodes.  Patient does endorse some shortness of breath.   Hospital Course by Problem:   1 acute diastolic CHF/right heart failure Patient presented with sudden onset shortness of breath, which per daughter woke her up at 4 AM.  Patient denies any chest pain.  Per daughter when EMS got the patient with sats of 98%.  Patient had presented to PCPs office and did complain of chest pain at PCPs office and was sent to the ED however on interview patient denies any ongoing chest pain.  BNP elevated at 219.6.  High-sensitivity troponin was 9 and repeat was 9.  EKG with A. fib with a rate of 87 with no ischemic changes noted.  D-dimer elevated however CT angiogram negative for PE but concerning for right heart failure.  Patient with a history of OSA and noncompliant with CPAP.  -Patient given IV Lasix in the ER and on the floor but unfortunately no weights gathered and no I's and O's kept -Creatinine is trending up and blood pressure on the lower end of normal so we will hold on further Lasix -Defer to PCP to give a as needed Lasix depending on blood pressure as I doubt family will be able to weigh patient daily  2. Questionable chest pain Per ED physician patient had complaints of chest pain  at PCPs office.  Patient denies any ongoing chest pain.  Patient denies any chest pain to physician during admission.  Patient with a TIMI risk for of approximately 4 due to age and comorbidities..  EKG with A. fib which is chronic with a rate of 87.  High-sensitivity troponin negative x2.  BNP of 219.6 which is improved from prior.  CT angiogram chest negative for PE however concerning for right heart failure and also does note four-vessel  moderate and severe coronary calcification.   -Echo: EF of 60 to 65% with normal function and no wall motion abnormalities, diastolic dysfunction could not be evaluated  3.  OSA CPAP nightly.  4.  [name] We will need outpatient follow-up with vascular.  5.  Chronic atrial fibrillation Continue atenolol for rate control.  Patient deemed not a anticoagulation candidate due to history of falls.  Continue aspirin.  6.  Hyperlipidemia LDL is 32 Continue statin  7.  Hypertension Continue home regimen atenolol -May need as needed dose of Lasix but blood pressure on the lower end  8.  Neuropathy Resume home regimen of Neurontin.  9.  Chronic pain syndrome Resume home regimen of pain medications.  Questionable dysphagia -Ask SLP to see and they recommend an outpatient modified barium swallow  Daughter and other family members to provide 24/7 care.  Ordered PT/OT  Medical Consultants:      Discharge Exam:   Vitals:   07/10/19 0334 07/10/19 0839  BP: (!) 103/58 100/70  Pulse: 85   Resp: 15 20  Temp: 97.7 F (36.5 C) 98.7 F (37.1 C)  SpO2: 99%    Vitals:   07/10/19 0228 07/10/19 0300 07/10/19 0334 07/10/19 0839  BP: 91/68 117/70 (!) 103/58 100/70  Pulse: 84 85 85   Resp: 14 14 15 20   Temp:   97.7 F (36.5 C) 98.7 F (37.1 C)  TempSrc:   Axillary Axillary  SpO2: 99% 98% 99%   Weight:   81.9 kg   Height:   5\' 2"  (1.575 m)     General exam: Appears calm and comfortable.  Up in chair   The results of significant diagnostics from this hospitalization (including imaging, microbiology, ancillary and laboratory) are listed below for reference.     Procedures and Diagnostic Studies:   DG Chest 2 View  Result Date: 07/09/2019 CLINICAL DATA:  Chest pain, short of breath EXAM: CHEST - 2 VIEW COMPARISON:  06/08/2018 FINDINGS: Frontal and lateral views of the chest demonstrate stable enlargement the cardiac silhouette. There is marked ectasia of the thoracic aorta  with diffuse atherosclerosis, not appreciably changed since previous exam. No airspace disease, effusion, or pneumothorax. No acute bony abnormalities. Chronic T12 vertebral plana. IMPRESSION: 1. Stable atherosclerosis and ectasia of the thoracic aorta. 2. No acute intrathoracic process. Electronically Signed   By: Randa Ngo M.D.   On: 07/09/2019 12:00   CT Angio Chest PE W/Cm &/Or Wo Cm  Result Date: 07/09/2019 CLINICAL DATA:  Dyspnea. EXAM: CT ANGIOGRAPHY CHEST WITH CONTRAST TECHNIQUE: Multidetector CT imaging of the chest was performed using the standard protocol during bolus administration of intravenous contrast. Multiplanar CT image reconstructions and MIPs were obtained to evaluate the vascular anatomy. CONTRAST:  55mL OMNIPAQUE IOHEXOL 350 MG/ML SOLN Automatic exposure control utilized. COMPARISON:  June 09, 2018 FINDINGS: Cardiovascular: Moderate cardiomegaly. Right atrial enlargement. Moderate three-vessel coronary calcification and severe LAD calcification. Thoracoabdominal aorta calcified atherosclerosis. 3.7 cm short segment saccular dilatation of the descending thoracic aorta over a 2 cm segment.  3.4 cm fusiform dilatation of the aortic arch. No contrast filling defect in the pulmonary trunk, right pulmonary artery, left pulmonary artery, or bilateral lobar pulmonary arteries. Respiratory motion artifact and contrast bolus timing preclude evaluation of the more distal segmental and subsegmental pulmonary arteries. Mediastinum/Nodes: No adenopathy. Lungs/Pleura: Central pulmonary vascular congestion without pulmonary edema or pneumonia or pleural effusion. Upper Abdomen: Mild medical renal disease. Splenule. Reflux of contrast material into the hepatic veins. Musculoskeletal: Diffuse bone demineralization with vertebral planum deformity of T12 with mild retropulsion, unchanged. Review of the MIP images confirms the above findings. IMPRESSION: 1. No pulmonary embolism in the central or  bilateral lobar pulmonary arteries. No right heart strain. Respiratory motion artifact and contrast bolus timing preclude evaluation for more distal pulmonary embolism. 2. Moderate cardiomegaly with evidence of right heart failure and four-vessel moderate and severe coronary calcification. 3. A 3.7 cm short segment saccular dilatation of the descending thoracic aorta over a 2 cm segment, increased from 3.4 cm in February 2020. 4. A 3.4 cm fusiform dilatation of the aortic arch similar to February 2020. Recommend annual imaging followup by CTA or MRA. This recommendation follows 2010 ACCF/AHA/AATS/ACR/ASA/SCA/SCAI/SIR/STS/SVM Guidelines for the Diagnosis and Management of Patients with Thoracic Aortic Disease. Circulation.2010; [id number]. Aortic aneurysm NOS (ICD10-I71.9). 5. Incomplete imaging of upper abdominal aorta aneurysmal dilatation which measured 4.2 cm in February 2020. Recommend followup by abdomen and pelvis CTA in 6 months, and vascular surgery referral/consultation if not already obtained. This recommendation follows ACR consensus guidelines: White Paper of the ACR Incidental Findings Committee II on Vascular Findings. J Am Coll Radiol 2013; 10:789-794. Aortic aneurysm NOS (ICD10-I71.9) Aortic Atherosclerosis (ICD10-I70.0). Electronically Signed   By: Revonda Humphrey   On: 07/09/2019 16:11   ECHOCARDIOGRAM COMPLETE  Result Date: 07/10/2019    ECHOCARDIOGRAM REPORT   Patient Name:   AUDREY ALONZO Date of Exam: 07/10/2019 Medical Rec #:  [id number]       Height:       62.0 in Accession #:    [id number]      Weight:       180.6 lb Date of Birth:  08-20-1933       BSA:          1.830 m Patient Age:    11 years        BP:           103/58 mmHg Patient Gender: F               HR:           85 bpm. Exam Location:  Inpatient Procedure: 2D Echo, Cardiac Doppler and Color Doppler Indications:    Dyspnea 786.09 / R06.00                  CHF-Acute Diastolic [id number] / [id number]  History:        Patient has prior  history of Echocardiogram examinations, most                 recent 02/06/2018. CHF, Arrythmias:Atrial Fibrillation,                 Signs/Symptoms:Dyspnea; Risk Factors:Hypertension, Sleep Apnea                 and Dyslipidemia. [name].  Sonographer:    Clayton Lefort RDCS (AE) Referring Phys: [place] Comments: Patient is morbidly obese and suboptimal subcostal window. Image acquisition challenging due to patient body habitus. IMPRESSIONS  1.  Left ventricular ejection fraction, by estimation, is 60 to 65%. The left ventricle has normal function. The left ventricle has no regional wall motion abnormalities. There is severe left ventricular hypertrophy. Left ventricular diastolic function could not be evaluated. Left ventricular diastolic function could not be evaluated.  2. Right ventricular systolic function is low normal. The right ventricular size is normal. There is mildly elevated pulmonary artery systolic pressure.  3. The mitral valve is abnormal. Trivial mitral valve regurgitation.  4. The aortic valve is tricuspid. Aortic valve regurgitation is not visualized. Mild aortic valve sclerosis is present, with no evidence of aortic valve stenosis.  5. Aortic dilatation noted. There is mild dilatation of the aortic root and of the ascending aorta measuring 39 mm.  6. The inferior vena cava is dilated in size with <50% respiratory variability, suggesting right atrial pressure of 15 mmHg. Conclusion(s)/Recommendation(s): Findings consistent with Severe LVH and diastolic dysfunction, consider outpatient amyloidosis workup. FINDINGS  Left Ventricle: Left ventricular ejection fraction, by estimation, is 60 to 65%. The left ventricle has normal function. The left ventricle has no regional wall motion abnormalities. The left ventricular internal cavity size was normal in size. There is  severe left ventricular hypertrophy. Left ventricular diastolic function could not be evaluated due to atrial  fibrillation. Left ventricular diastolic function could not be evaluated. Right Ventricle: The right ventricular size is normal. No increase in right ventricular wall thickness. Right ventricular systolic function is low normal. There is mildly elevated pulmonary artery systolic pressure. The tricuspid regurgitant velocity is 1.95 m/s, and with an assumed right atrial pressure of 15 mmHg, the estimated right ventricular systolic pressure is 123XX123 mmHg. Left Atrium: Left atrial size was normal in size. Right Atrium: Right atrial size was normal in size. Pericardium: There is no evidence of pericardial effusion. Mitral Valve: The mitral valve is abnormal. There is mild calcification of the mitral valve leaflet(s). Trivial mitral valve regurgitation. Tricuspid Valve: The tricuspid valve is grossly normal. Tricuspid valve regurgitation is mild. Aortic Valve: The aortic valve is tricuspid. Aortic valve regurgitation is not visualized. Mild aortic valve sclerosis is present, with no evidence of aortic valve stenosis. Pulmonic Valve: The pulmonic valve was grossly normal. Pulmonic valve regurgitation is not visualized. Aorta: Aortic dilatation noted. There is mild dilatation of the aortic root and of the ascending aorta measuring 39 mm. Venous: The inferior vena cava is dilated in size with less than 50% respiratory variability, suggesting right atrial pressure of 15 mmHg. IAS/Shunts: No atrial level shunt detected by color flow Doppler.  LEFT VENTRICLE PLAX 2D LVIDd:         3.40 cm LVIDs:         2.30 cm LV PW:         1.70 cm LV IVS:        1.80 cm LVOT diam:     2.00 cm LV SV:         45 LV SV Index:   25 LVOT Area:     3.14 cm  RIGHT VENTRICLE            IVC RV Basal diam:  3.00 cm    IVC diam: 2.20 cm RV S prime:     6.06 cm/s TAPSE (M-mode): 0.7 cm LEFT ATRIUM             Index       RIGHT ATRIUM           Index LA diam:  2.80 cm 1.53 cm/m  RA Area:     18.10 cm LA Vol (A2C):   49.5 ml 27.04 ml/m RA Volume:    40.30 ml  22.02 ml/m LA Vol (A4C):   44.1 ml 24.09 ml/m LA Biplane Vol: 49.8 ml 27.21 ml/m  AORTIC VALVE LVOT Vmax:   71.66 cm/s LVOT Vmean:  46.020 cm/s LVOT VTI:    0.143 m  AORTA Ao Root diam: 2.90 cm Ao Asc diam:  3.90 cm TRICUSPID VALVE TR Peak grad:   15.2 mmHg TR Vmax:        195.00 cm/s  SHUNTS Systemic VTI:  0.14 m Systemic Diam: 2.00 cm Lyman Bishop MD Electronically signed by Lyman Bishop MD Signature Date/Time: 07/10/2019/10:33:10 AM    Final      Labs:   Basic Metabolic Panel: Recent Labs  Lab 07/09/19 1128 07/09/19 2010 07/10/19 0305  NA 133*  --  133*  K 4.3  --  3.8  CL 97*  --  95*  CO2 23  --  26  GLUCOSE 115*  --  95  BUN 23  --  22  CREATININE 0.99  --  1.22*  CALCIUM 9.3  --  9.2  MG  --  1.7  --    GFR Estimated Creatinine Clearance: 34.6 mL/min (A) (by C-G formula based on SCr of 1.22 mg/dL (H)). Liver Function Tests: Recent Labs  Lab 07/09/19 1128  AST 25  ALT 13  ALKPHOS 43  BILITOT 1.4*  PROT 7.2  ALBUMIN 3.9   No results for input(s): LIPASE, AMYLASE in the last 168 hours. No results for input(s): AMMONIA in the last 168 hours. Coagulation profile No results for input(s): INR, PROTIME in the last 168 hours.  CBC: Recent Labs  Lab 07/09/19 1128  WBC 8.2  HGB 13.2  HCT 41.5  MCV 90.6  PLT 165   Cardiac Enzymes: No results for input(s): CKTOTAL, CKMB, CKMBINDEX, TROPONINI in the last 168 hours. BNP: Invalid input(s): POCBNP CBG: Recent Labs  Lab 07/09/19 1120  GLUCAP 107*   D-Dimer Recent Labs    07/09/19 1320  DDIMER 0.93*   Hgb A1c No results for input(s): HGBA1C in the last 72 hours. Lipid Profile Recent Labs    07/10/19 0305  CHOL 86  HDL 30*  LDLCALC 32  TRIG 118  CHOLHDL 2.9   Thyroid function studies No results for input(s): TSH, T4TOTAL, T3FREE, THYROIDAB in the last 72 hours.  Invalid input(s): FREET3 Anemia work up No results for input(s): VITAMINB12, FOLATE, FERRITIN, TIBC, IRON, RETICCTPCT in  the last 72 hours. Microbiology Recent Results (from the past 240 hour(s))  SARS CORONAVIRUS 2 (TAT 6-24 HRS) Nasopharyngeal Nasopharyngeal Swab     Status: None   Collection Time: 07/09/19  5:06 PM   Specimen: Nasopharyngeal Swab  Result Value Ref Range Status   SARS Coronavirus 2 NEGATIVE NEGATIVE Final    Comment: (NOTE) SARS-CoV-2 target nucleic acids are NOT DETECTED. The SARS-CoV-2 RNA is generally detectable in upper and lower respiratory specimens during the acute phase of infection. Negative results do not preclude SARS-CoV-2 infection, do not rule out co-infections with other pathogens, and should not be used as the sole basis for treatment or other patient management decisions. Negative results must be combined with clinical observations, patient history, and epidemiological information. The expected result is Negative. Fact Sheet for Patients: SugarRoll.be Fact Sheet for Healthcare Providers: https://www.woods-mathews.com/ This test is not yet approved or cleared by the [place] FDA and  has been authorized for detection and/or diagnosis of SARS-CoV-2 by FDA under an Emergency Use Authorization (EUA). This EUA will remain  in effect (meaning this test can be used) for the duration of the COVID-19 declaration under Section 56 4(b)(1) of the Act, 21 U.S.C. section 360bbb-3(b)(1), unless the authorization is terminated or revoked sooner. Performed at [facility], [place] [address] [place], [place] [address]   MRSA PCR Screening     Status: None   Collection Time: 07/10/19  3:37 AM   Specimen: Nasal Mucosa; Nasopharyngeal  Result Value Ref Range Status   MRSA by PCR NEGATIVE NEGATIVE Final    Comment:        The GeneXpert MRSA Assay (FDA approved for NASAL specimens only), is one component of a comprehensive MRSA colonization surveillance program. It is not intended to diagnose MRSA infection nor to guide  or monitor treatment for MRSA infections. Performed at [facility], [place] [address], [place], [place] [address]      Discharge Instructions:   Discharge Instructions    Discharge instructions   Complete by: As directed    Home health  Outpatient [facility] Follow guidelines per speech therapy for diet/eating Continue to use incentive spirometry at home   Increase activity slowly   Complete by: As directed      Allergies as of 07/10/2019      Reactions   Sulfonamide Derivatives Hives, Rash      Medication List    STOP taking these medications   clotrimazole-betamethasone cream Commonly known as: Lotrisone   nitrofurantoin (macrocrystal-monohydrate) 100 MG capsule Commonly known as: Macrobid     TAKE these medications   ammonium lactate 12 % cream Commonly known as: AMLACTIN Apply topically as needed for dry skin.   aspirin EC 81 MG tablet Take 81 mg by mouth daily.   atenolol 50 MG tablet Commonly known as: TENORMIN TAKE 1 TABLET BY MOUTH DAILY   CALCIUM + D PO Take 1 tablet by mouth daily. 600/400   Cholecalciferol 50 MCG (2000 UT) Tabs Take by mouth.   diclofenac sodium 1 % Gel Commonly known as: VOLTAREN Apply 2 g topically 4 (four) times daily. Rub into affected area of foot 2 to 4 times daily   DULoxetine 60 MG capsule Commonly known as: CYMBALTA Take 60 mg by mouth daily. Take along with 30mg  for a total daily dose of 90mg .   DULoxetine 30 MG capsule Commonly known as: CYMBALTA Take 30 mg by mouth daily. Takes along with 60mg  for total daily dose of 90mg .   gabapentin 800 MG tablet Commonly known as: NEURONTIN Take 800-1,600 mg by mouth as directed. 800 mg in the morning, 800 mg at noon, 1600 mg at night   oxyCODONE-acetaminophen 7.5-325 MG tablet Commonly known as: Percocet Take 1 tablet by mouth every 8 (eight) hours as needed for severe pain.   pantoprazole 40 MG tablet Commonly known as: PROTONIX TAKE 1 TABLET BY MOUTH DAILY    rosuvastatin 40 MG tablet Commonly known as: CRESTOR Take 1 tablet (40 mg total) by mouth every evening.   TYLENOL 500 MG tablet Generic drug: acetaminophen Take 500 mg by mouth every 8 (eight) hours as needed for mild pain.         Time coordinating discharge: 25 minutes  Signed:  Geradine Girt DO  [facility] 07/10/2019, 2:33 PM

## 2019-07-10 NOTE — Evaluation (Signed)
Clinical/Bedside Swallow Evaluation Patient Details  Name: [name] MRN: [id number] Date of Birth: Feb 26, 1934  Today's Date: 07/10/2019 Time: SLP Start Time (ACUTE ONLY): 65 SLP Stop Time (ACUTE ONLY): 1554 SLP Time Calculation (min) (ACUTE ONLY): 17 min  Past Medical History:  Past Medical History:  Diagnosis Date  . [name] (abdominal aortic aneurysm) ([place]) 09/2013   3.6 cm  . Acute and chronic respiratory failure with hypoxia ([place])   . Acute delirium   . Acute encephalopathy   . Acute pyelonephritis   . Atrial fibrillation ([place])   . Chronic pain syndrome   . Constipation   . Dementia ([place])   . Dependent edema    bilateral legs   . Depression   . Diabetes mellitus    pre-diabetes  . Diverticulosis   . Diverticulosis   . Dysuria   . E coli bacteremia   . Fall   . Gross hematuria   . Hydronephrosis with renal and ureteral calculous obstruction   . Hyperlipidemia   . Hypertension   . Hypoxemia   . Idiopathic peripheral autonomic neuropathy   . Infestation by bed bug   . Nephrolithiasis   . OSA on CPAP   . Osteoarthritis   . Osteopenia    T=  -2.1 in hip  . Persistent mood (affective) disorder, unspecified ([place])   . Prediabetes   . Rheumatoid arthritis ([place])   . Rheumatoid arthritis(714.0)   . S/P carpal tunnel release   . Sepsis secondary to UTI ([place])   . Spinal stenosis   . Unspecified fracture of fifth lumbar vertebra, initial encounter for closed fracture ([place])   . Unspecified fracture of t11-T12 vertebra, initial encounter for closed fracture ([place])   . Urinary incontinence   . Urinary tract infection    hx of   . Urolithiasis    Past Surgical History:  Past Surgical History:  Procedure Laterality Date  . APPENDECTOMY    . CARPAL TUNNEL RELEASE  12/27/2006   Right subcutaneous ulnar nerve transfer -- Right open carpal tunnel release  . CHOLECYSTECTOMY    . CYSTOSCOPY W/ URETERAL STENT PLACEMENT Right 01/30/2018   Procedure: CYSTOSCOPY WITH STENT  REPLACEMENT retrograde pylegram;  Surgeon: Ardis Hughs, MD;  Location: [facility];  Service: Urology;  Laterality: Right;  . CYSTOSCOPY WITH URETEROSCOPY AND STENT PLACEMENT Right 03/01/2018   Procedure: CYSTOSCPY/RIGHT URETEROSCOPY HOLMIUM LASER LITHOTRIPSY AND STENT EXCHANGE;  Surgeon: Ardis Hughs, MD;  Location: [facility];  Service: Urology;  Laterality: Right;  . FOOT SURGERY    . HOLMIUM LASER APPLICATION Right [id number]   Procedure: HOLMIUM LASER APPLICATION;  Surgeon: Ardis Hughs, MD;  Location: [facility];  Service: Urology;  Laterality: Right;  . JOINT REPLACEMENT     BTKR, RTHR  . TOTAL KNEE ARTHROPLASTY  08/21/2007   Right total knee replacement   HPI:  Pt is an 86 y.o. female with medical history significant of chronic atrial fibrillation, [name], OSA, hypertension, chronic pain syndrome, hyperlipidemia, dementia who presented to the ED with several hour history of shortness of breath. Chest x-ray: no acute intrathoracic process. Nursing reported that the coughs and "brings back up" foods approximately 30 minutes after meals.    Assessment / Plan / Recommendation Clinical Impression  Pt was seen for bedside swallow evaluation with her daughter present. Pt and her daughter denied the pt having a a history of oropharyngeal dysphagia. However, pt's daughter reported that she believes that the pt may have reflux since approximately 20-30 minutes  after her meal she exhibited coughing and then "vomited a little". Pt reported the sensation of food "not going down" and she identified the mid chest area as the site of the sensation. Oral mechanism exam was [facility] and pt was edentulous. She tolerated all solids and liquids without signs or symptoms of oropharyngeal dysphagia. Further skilled SLP services are not clinically indicated at this time. However, pt does present with symptoms of esophageal dysphagia. Consider esophageal assessment such as esophagram to evaluate the functional  integrity of the esophageal phase of the swallow. Pt, nursing, and her daughter were educated regarding results and recommendations; all parties verbalized understanding as well as agreement with plan of care. SLP Visit Diagnosis: Dysphagia, unspecified (R13.10)    Aspiration Risk  No limitations    Diet Recommendation Regular;Thin liquid   Liquid Administration via: Cup;Straw Medication Administration: Whole meds with liquid Supervision: Patient able to self feed Compensations: Slow rate;Small sips/bites Postural Changes: Seated upright at 90 degrees;Remain upright for at least 30 minutes after po intake    Other  Recommendations Oral Care Recommendations: Oral care BID   Follow up Recommendations None      Frequency and Duration            Prognosis        Swallow Study   General Date of Onset: 07/09/19 HPI: Pt is an 86 y.o. female with medical history significant of chronic atrial fibrillation, [name], OSA, hypertension, chronic pain syndrome, hyperlipidemia, dementia who presented to the ED with several hour history of shortness of breath. Chest x-ray: no acute intrathoracic process. Nursing reported that the coughs and "brings back up" foods approximately 30 minutes after meals.  Type of Study: Bedside Swallow Evaluation Diet Prior to this Study: Regular;Thin liquids Temperature Spikes Noted: No Respiratory Status: Nasal cannula History of Recent Intubation: No Behavior/Cognition: Alert;Pleasant mood;Cooperative Oral Cavity Assessment: Within Functional Limits Oral Care Completed by SLP: Yes Vision: Functional for self-feeding Self-Feeding Abilities: Able to feed self Patient Positioning: Upright in bed;Postural control adequate for testing Baseline Vocal Quality: Normal Volitional Cough: Strong Volitional Swallow: Able to elicit    Oral/Motor/Sensory Function Overall Oral Motor/Sensory Function: Within functional limits   Ice Chips Ice chips: Not tested   Thin  Liquid Thin Liquid: Within functional limits    Nectar Thick Nectar Thick Liquid: Not tested   Honey Thick Honey Thick Liquid: Not tested   Puree Puree: Within functional limits   Solid     Solid: Within functional limits Presentation: [address] Negus, [place], [place] Office number [phone] Pager [phone]  Horton Marshall 07/10/2019,5:26 PM

## 2019-07-10 NOTE — TOC Initial Note (Signed)
Transition of Care [facility]) - Initial/Assessment Note    Patient Details  Name: [name] MRN: [id number] Date of Birth: 1933-08-12  [facility] of [facility] CM/SW Contact:    Alberteen Sam, LCSW Phone Number: 07/10/2019, 12:13 PM  Clinical Narrative:                  CSW met with patient and daughter Learta Codding at bedside, CSW informed of PT rec of SNF, daughter reports patient will be going home. She states patient lives with Liza's brother, and that siblings are very involved in providing care for patient. She reports she currently has an aid that comes in every day M-F however is agreeable to home health PT and OT services with no agency preference.   CSW has sent out referrals, spoke with Ronalee Belts at [facility], able to accept and  patient is set up with [facility] services for PT and OT.   No DME needs identified, Learta Codding reports patient already has walker and lift chair at home.   No further discharge needs identified at this time.   Expected Discharge Plan: [place] Barriers to Discharge: Continued Medical Work up   Patient Goals and CMS Choice   CMS Medicare.gov Compare Post Acute Care list provided to:: Patient Represenative (must comment)(dauhgter Liza) Choice offered to / list presented to : Adult Children  Expected Discharge Plan and Services Expected Discharge Plan: [place] Choice: [place] arrangements for the past 2 months: Single Family Home                                      Prior Living Arrangements/Services Living arrangements for the past 2 months: Single Family Home Lives with:: Adult Children Patient language and need for interpreter reviewed:: Yes        Need for Family Participation in Patient Care: Yes (Comment) Care giver support system in place?: Yes (comment)   Criminal Activity/Legal Involvement Pertinent to Current Situation/Hospitalization: No - Comment as  needed  Activities of Daily Living      Permission Sought/Granted Permission sought to share information with : Case Manager, Customer service manager, Family Supports Permission granted to share information with : Yes, Verbal Permission Granted  Share Information with NAME: Learta Codding  Permission granted to share info w AGENCY: [place] granted to share info w Relationship: daughter  Permission granted to share info w Contact Information: [phone]  Emotional Assessment Appearance:: Appears stated age Attitude/Demeanor/Rapport: Gracious Affect (typically observed): Calm Orientation: : Oriented to Self, Oriented to Place, Oriented to  Time, Oriented to Situation Alcohol / Substance Use: Not Applicable Psych Involvement: No (comment)  Admission diagnosis:  Shortness of breath [R06.02] Dyspnea [R06.00] Coronary artery calcification seen on CT scan [I25.10] Chest pain, unspecified type [R07.9] Patient Active Problem List   Diagnosis Date Noted  . Dyspnea 07/09/2019  . Acute diastolic CHF (congestive heart failure) ([place]) 07/09/2019  . Chest pain   . Coronary artery calcification seen on CT scan   . Gait abnormality 01/24/2019  . Tremor 01/24/2019  . Memory loss 01/24/2019  . Atelectasis of left lung 04/06/2018  . Acute encephalopathy 02/09/2018  . Infestation by bed bug 02/03/2018  . Urinary tract infection due to extended-spectrum beta lactamase (ESBL) producing Escherichia coli 02/02/2018  . Chronic a-fib ([place]) 02/02/2018  . Constipation  01/31/2018  . Urolithiasis 01/30/2018  . Fall   . T12 compression fracture ([place]) 09/04/2017  . Fall at home, initial encounter 07/03/2017  . Tachycardia 02/03/2017  . Chronic pain syndrome 02/02/2017  . Abnormal EKG 11/21/2013  . [name] (abdominal aortic aneurysm) ([place]) 09/30/2013  . OSA (obstructive sleep apnea) 06/29/2012  . Dementia ([place]) 06/29/2012  . Pre-diabetes 06/29/2012  . Hypoxia, sleep related 06/29/2012   . RA (rheumatoid arthritis) ([place]) 06/29/2012  . Nonspecific abnormal results of cardiovascular function study 06/02/2011  . Nonspecific abnormal electrocardiogram (ECG) (EKG) 05/18/2011  . Osteoporosis 04/09/2008  . Hyperlipidemia 05/12/2006  . DEPENDENT EDEMA, LEGS, BILATERAL 05/12/2006  . PERIPHERAL NEUROPATHY 03/11/2006  . Essential hypertension 03/11/2006   PCP:  [name], MD Pharmacy:   [id number] [place], [place] - [address] [place] [id number]: [phone]: [id number]: [phone]: [phone]    Social Determinants of Health (SDOH) Interventions    Readmission Risk Interventions No flowsheet data found.

## 2019-07-10 NOTE — Progress Notes (Signed)
  Echocardiogram 2D Echocardiogram has been performed.  [name] 07/10/2019, 8:44 AM

## 2019-07-12 ENCOUNTER — Other Ambulatory Visit: Payer: Self-pay | Admitting: Family Medicine

## 2019-07-12 ENCOUNTER — Telehealth: Payer: Self-pay | Admitting: *Deleted

## 2019-07-12 DIAGNOSIS — Z09 Encounter for follow-up examination after completed treatment for conditions other than malignant neoplasm: Secondary | ICD-10-CM

## 2019-07-12 NOTE — Telephone Encounter (Signed)
Hydrocodone/APAP is not on current list.   Oxycodone/APAP is on current list, but has not been refilled since 2019.  MD please advise.

## 2019-07-12 NOTE — Telephone Encounter (Signed)
Pt's son, Legrand Como states [employer] states it will only pay for 3 x day and she needs it for 4 times a day. I spoke with Dr. Stephenie Acres assistant, A. Prevette, CMA and she states pt is seeing someone else to trim her toenails and Dr. Alesia Banda. I informed Legrand Como that Dr. Milinda Pointer had not filled the medication since 2019 and he may want to contact either her PCP or neurologist.

## 2019-07-12 NOTE — Telephone Encounter (Signed)
[facility]  Patient needs refill on hydrocodone

## 2019-07-15 ENCOUNTER — Telehealth: Payer: Self-pay | Admitting: Neurology

## 2019-07-15 MED ORDER — OXYCODONE-ACETAMINOPHEN 7.5-325 MG PO TABS: 1.0000 | ORAL_TABLET | Freq: Three times a day (TID) | ORAL | 0 refills | Status: DC | PRN

## 2019-07-15 NOTE — Telephone Encounter (Signed)
1) Medication(s) Requested (by name): gabapentin (NEURONTIN) 800 MG tablet   2) Pharmacy of Choice: [id number] [place], [place] - [address]  [address], [place] [address]  3) Special Requests:  Pt son called stating he was informed by PCP this medication was refilled by neurologist

## 2019-07-15 NOTE — Telephone Encounter (Signed)
The patient was previously taking gabapentin 800mg , one tab in am, one tab midday, two tabs QHS. It was being prescribed by her podiatrist, Dr. Tyson Dense.   I called [facility] and spoke to the pharmacist, [place]. She said the prescription was last filled on 05/09/2019. She said the insurance will no longer allow the same dosage of medication. Her plan will only cover three tablets daily.  I called her son back. He confirmed she has not taken the medication since February but her neuropathy is causing her discomfort and she would like to restart it.  She has been scheduled for an appt w/ Dr. Krista Blue on 07/17/19 to discuss medication management.

## 2019-07-17 ENCOUNTER — Other Ambulatory Visit: Payer: Self-pay

## 2019-07-17 ENCOUNTER — Encounter: Payer: Self-pay | Admitting: Neurology

## 2019-07-17 ENCOUNTER — Ambulatory Visit (INDEPENDENT_AMBULATORY_CARE_PROVIDER_SITE_OTHER): Payer: Medicare Other | Admitting: Neurology

## 2019-07-17 VITALS — BP 132/78 | HR 62 | Temp 97.3°F

## 2019-07-17 DIAGNOSIS — Z7982 Long term (current) use of aspirin: Secondary | ICD-10-CM | POA: Diagnosis not present

## 2019-07-17 DIAGNOSIS — I2510 Atherosclerotic heart disease of native coronary artery without angina pectoris: Secondary | ICD-10-CM | POA: Diagnosis not present

## 2019-07-17 DIAGNOSIS — I5031 Acute diastolic (congestive) heart failure: Secondary | ICD-10-CM | POA: Diagnosis not present

## 2019-07-17 DIAGNOSIS — E785 Hyperlipidemia, unspecified: Secondary | ICD-10-CM | POA: Diagnosis not present

## 2019-07-17 DIAGNOSIS — R269 Unspecified abnormalities of gait and mobility: Secondary | ICD-10-CM

## 2019-07-17 DIAGNOSIS — I714 Abdominal aortic aneurysm, without rupture: Secondary | ICD-10-CM | POA: Diagnosis not present

## 2019-07-17 DIAGNOSIS — I110 Hypertensive heart disease with heart failure: Secondary | ICD-10-CM | POA: Diagnosis not present

## 2019-07-17 DIAGNOSIS — I6322 Cerebral infarction due to unspecified occlusion or stenosis of basilar arteries: Secondary | ICD-10-CM | POA: Insufficient documentation

## 2019-07-17 DIAGNOSIS — G894 Chronic pain syndrome: Secondary | ICD-10-CM | POA: Diagnosis not present

## 2019-07-17 DIAGNOSIS — G4733 Obstructive sleep apnea (adult) (pediatric): Secondary | ICD-10-CM | POA: Diagnosis not present

## 2019-07-17 DIAGNOSIS — I482 Chronic atrial fibrillation, unspecified: Secondary | ICD-10-CM | POA: Diagnosis not present

## 2019-07-17 DIAGNOSIS — R413 Other amnesia: Secondary | ICD-10-CM | POA: Diagnosis not present

## 2019-07-17 DIAGNOSIS — G629 Polyneuropathy, unspecified: Secondary | ICD-10-CM | POA: Diagnosis not present

## 2019-07-17 DIAGNOSIS — R251 Tremor, unspecified: Secondary | ICD-10-CM

## 2019-07-17 DIAGNOSIS — M069 Rheumatoid arthritis, unspecified: Secondary | ICD-10-CM | POA: Diagnosis not present

## 2019-07-17 DIAGNOSIS — F0390 Unspecified dementia without behavioral disturbance: Secondary | ICD-10-CM | POA: Diagnosis not present

## 2019-07-17 DIAGNOSIS — F329 Major depressive disorder, single episode, unspecified: Secondary | ICD-10-CM | POA: Diagnosis not present

## 2019-07-17 DIAGNOSIS — M810 Age-related osteoporosis without current pathological fracture: Secondary | ICD-10-CM | POA: Diagnosis not present

## 2019-07-17 MED ORDER — GABAPENTIN 800 MG PO TABS: 800.0000 mg | ORAL_TABLET | Freq: Three times a day (TID) | ORAL | 4 refills | Status: DC

## 2019-07-17 NOTE — Progress Notes (Signed)
PATIENT: [name] DOB: 1934/01/18  Chief Complaint  Patient presents with  . Memory Loss    MMSE 18/30 - 3 animals. She is here with her son, [name]. She has good and bad days with her memory. She is not feeling well today.  . Gait Problem/Pain    She has noticed a decline in her mobility and an increase in pain since being off gabapentin. Last taken in 05/2019. She restarted home PT today.      HISTORICAL  [name] is a 86 year old female, seen in request by her primary care physician Dr. Jenna Luo for evaluation of bilateral hands tremor, initial evaluation was on January 24, 2019.  She is accompanied by her son [name] at today's clinic visit.  I have reviewed and summarized the referring note from the referring physician.  She had a past medical history of hypertension, hyperlipidemia, depression anxiety, rheumatoid arthritis,, multiple joint pain, bilateral shoulder pain, gradual onset memory loss.  Atrial fibrillation, not on anti-coagulation, only on aspirin 81 mg daily.   She was admitted to hospital in December 2019, was treated for UTI, she was noted to have increased confusion, was discharged to rehabilitation following hospital admission, she was noted to have steady decline since then.  Now she lives at home with her sons, spent most of the time in her wheelchair, gait abnormality, complains of significant bilateral shoulder pain, multiple joints pain, she was noted to have gradual onset bilateral hands tremor, most noticeable in her dominant right hand, difficulty holding utensils.  There was no family history of tremor, her short term memory loss is gradually getting worse, today's Mini-Mental Status Examination 22/30 Laboratory evaluation in August 2020, CMP showed creatinine of 0.93, normal CBC,  UPDATE July 17 2019: She is accompanied by her son at today's clinical visit, she complains of diffuse body achy pain, by mistake, her pharmacy stopped her  gabapentin 800 mg 4 times a day abruptly few weeks ago, since then, she had flareup of significant body achy pain despite Cymbalta 90 mg daily, and also taking Percocet 7.5/325 every 8 hours from her pain management, she was admitted to the hospital in March [id number] for diastolic congestive heart failure,  I personally reviewed MRI of the brain without contrast February 01, 2016 2020: Chronic lacunar infarction in the op right pons, right lenticular form nuclear, extensive T2/flair hyper density lesions, consistent with severe chronic small vessel disease, moderate generalized atrophy,  I also reviewed her most recent hospital discharge in March 2021, for acute diastolic congestive heart failure, presented with shortness of breath, diffuse pain all over, she was treated with IV Lasix, with trending up of creatinine, and decreased blood pressure, CT angiogram showed no evidence of PE, four-vessel moderate to severe coronary artery calcification, ejection fraction 60 to [id number], diastolic function could not be evaluated, continue to be in chronic atrial fibrillation, on atenolol for rate control, not a candidate for anticoagulation, on aspirin 81 mg  REVIEW OF SYSTEMS: Full 14 system review of systems performed and notable only for as above All other review of systems were negative.  ALLERGIES: Allergies  Allergen Reactions  . Sulfonamide Derivatives Hives and Rash    HOME MEDICATIONS: Current Outpatient Medications  Medication Sig Dispense Refill  . acetaminophen (TYLENOL) 500 MG tablet Take 500 mg by mouth every 8 (eight) hours as needed for mild pain.     Marland Kitchen ammonium lactate (AMLACTIN) 12 % cream Apply topically as needed for dry skin. [place]  g 0  . aspirin EC 81 MG tablet Take 81 mg by mouth daily.    Marland Kitchen atenolol (TENORMIN) 50 MG tablet TAKE 1 TABLET BY MOUTH DAILY (Patient taking differently: Take 50 mg by mouth daily. ) 90 tablet 3  . Calcium Citrate-Vitamin D (CALCIUM + D PO) Take 1 tablet by mouth  daily. 600/400    . Cholecalciferol 50 MCG (2000 UT) TABS Take by mouth.    . diclofenac sodium (VOLTAREN) 1 % GEL Apply 2 g topically 4 (four) times daily. Rub into affected area of foot 2 to 4 times daily 100 g 2  . DULoxetine (CYMBALTA) 30 MG capsule Take 30 mg by mouth daily. Takes along with 60mg  for total daily dose of 90mg .  2  . DULoxetine (CYMBALTA) 60 MG capsule Take 60 mg by mouth daily. Take along with 30mg  for a total daily dose of 90mg .    . gabapentin (NEURONTIN) 800 MG tablet Take 800-1,600 mg by mouth as directed. 800 mg in the morning, 800 mg at noon, 1600 mg at night    . oxyCODONE-acetaminophen (PERCOCET) 7.5-325 MG tablet Take 1 tablet by mouth every 8 (eight) hours as needed for severe pain. 90 tablet 0  . pantoprazole (PROTONIX) 40 MG tablet TAKE 1 TABLET BY MOUTH DAILY (Patient taking differently: Take 40 mg by mouth daily. ) 30 tablet 3  . rosuvastatin (CRESTOR) 40 MG tablet Take 1 tablet (40 mg total) by mouth every evening. 90 tablet 3   No current facility-administered medications for this visit.    PAST MEDICAL HISTORY: Past Medical History:  Diagnosis Date  . [name] (abdominal aortic aneurysm) ([place]) 09/2013   3.6 cm  . Acute and chronic respiratory failure with hypoxia ([place])   . Acute delirium   . Acute encephalopathy   . Acute pyelonephritis   . Atrial fibrillation ([place])   . Chronic pain syndrome   . Constipation   . Dementia ([place])   . Dependent edema    bilateral legs   . Depression   . Diabetes mellitus    pre-diabetes  . Diverticulosis   . Diverticulosis   . Dysuria   . E coli bacteremia   . Fall   . Gross hematuria   . Hydronephrosis with renal and ureteral calculous obstruction   . Hyperlipidemia   . Hypertension   . Hypoxemia   . Idiopathic peripheral autonomic neuropathy   . Infestation by bed bug   . Nephrolithiasis   . OSA on CPAP   . Osteoarthritis   . Osteopenia    T=  -2.1 in hip  . Persistent mood (affective) disorder,  unspecified ([place])   . Prediabetes   . Rheumatoid arthritis ([place])   . Rheumatoid arthritis(714.0)   . S/P carpal tunnel release   . Sepsis secondary to UTI ([place])   . Spinal stenosis   . Unspecified fracture of fifth lumbar vertebra, initial encounter for closed fracture ([place])   . Unspecified fracture of t11-T12 vertebra, initial encounter for closed fracture ([place])   . Urinary incontinence   . Urinary tract infection    hx of   . Urolithiasis     PAST SURGICAL HISTORY: Past Surgical History:  Procedure Laterality Date  . APPENDECTOMY    . CARPAL TUNNEL RELEASE  12/27/2006   Right subcutaneous ulnar nerve transfer -- Right open carpal tunnel release  . CHOLECYSTECTOMY    . CYSTOSCOPY W/ URETERAL STENT PLACEMENT Right 01/30/2018   Procedure: CYSTOSCOPY WITH STENT REPLACEMENT retrograde pylegram;  Surgeon: Ardis Hughs, MD;  Location: [facility];  Service: Urology;  Laterality: Right;  . CYSTOSCOPY WITH URETEROSCOPY AND STENT PLACEMENT Right 03/01/2018   Procedure: CYSTOSCPY/RIGHT URETEROSCOPY HOLMIUM LASER LITHOTRIPSY AND STENT EXCHANGE;  Surgeon: Ardis Hughs, MD;  Location: [facility];  Service: Urology;  Laterality: Right;  . FOOT SURGERY    . HOLMIUM LASER APPLICATION Right [id number]   Procedure: HOLMIUM LASER APPLICATION;  Surgeon: Ardis Hughs, MD;  Location: [facility];  Service: Urology;  Laterality: Right;  . JOINT REPLACEMENT     BTKR, RTHR  . TOTAL KNEE ARTHROPLASTY  08/21/2007   Right total knee replacement    FAMILY HISTORY: Family History  Problem Relation Age of Onset  . Stroke Father   . Coronary artery disease Son        CABG at age 66  . Heart disease Son   . Heart attack Son   . Stroke Mother   . Hypertension Sister   . Diabetes Brother   . Hypertension Maternal Aunt     SOCIAL HISTORY: Social History   Socioeconomic History  . Marital status: Divorced    Spouse name: Not on file  . Number of children: 6  . Years of education: 8th grade    . Highest education level: Not on file  Occupational History  . Occupation: Retired  Tobacco Use  . Smoking status: Never Smoker  . Smokeless tobacco: Never Used  Substance and Sexual Activity  . Alcohol use: No  . Drug use: No  . Sexual activity: Not on file  Other Topics Concern  . Not on file  Social History Narrative   Lives at home with her sons.   She has four living children.   Right-handed.   Two cups caffeine per day.   Social Determinants of Health   Financial Resource Strain:   . Difficulty of Paying Living Expenses:   Food Insecurity:   . Worried About Charity fundraiser in the Last Year:   . Arboriculturist in the Last Year:   Transportation Needs:   . Film/video editor (Medical):   Marland Kitchen Lack of Transportation (Non-Medical):   Physical Activity:   . Days of Exercise per Week:   . Minutes of Exercise per Session:   Stress:   . Feeling of Stress :   Social Connections:   . Frequency of Communication with Friends and Family:   . Frequency of Social Gatherings with Friends and Family:   . Attends Religious Services:   . Active Member of Clubs or Organizations:   . Attends Archivist Meetings:   Marland Kitchen Marital Status:   Intimate Partner Violence:   . Fear of Current or Ex-Partner:   . Emotionally Abused:   Marland Kitchen Physically Abused:   . Sexually Abused:      PHYSICAL EXAM   Vitals:   07/17/19 1356  BP: 132/78  Pulse: 62  Temp: (!) 97.3 F (36.3 C)    Not recorded      There is no height or weight on file to calculate BMI.  PHYSICAL EXAMNIATION:  Gen: NAD, conversant, well nourised, well groomed                     Cardiovascular: Regular rate rhythm, no peripheral edema, warm, nontender. Eyes: Conjunctivae clear without exudates or hemorrhage Neck: Supple, no carotid bruits. Pulmonary: Clear to auscultation bilaterally   NEUROLOGICAL EXAM: She sits in wheelchair, depressed looking elderly female  MENTAL STATUS:  MMSE - Mini Mental  State Exam 07/17/2019 01/24/2019  Orientation to time 4 4  Orientation to Place 4 4  Registration 3 3  Attention/ Calculation 0 3  Recall 0 1  Language- name 2 objects 2 2  Language- repeat 1 1  Language- follow 3 step command 3 3  Language- read & follow direction 1 1  Write a sentence 0 0  Copy design 0 0  Total score 18 22     CRANIAL NERVES: CN II: Visual fields are full to confrontation.  Pupils are round equal and briskly reactive to light. CN III, IV, VI: extraocular movement are normal. No ptosis. CN V: Facial sensation is intact to pinprick in all 3 divisions bilaterally. Corneal responses are intact.  CN VII: Face is symmetric with normal eye closure and smile. CN VIII: Hearing is normal to causal conversation. CN IX, X: Palate elevates symmetrically. Phonation is normal. CN XI: Head turning and shoulder shrug are intact CN XII: Tongue is midline with normal movements and no atrophy.  MOTOR: Deformity of multiple joints of bilateral hands, motor examination is limited by bilateral shoulder pain, joint pain, felt there was no significant distal muscle weakness or lower extremity muscles, she has mild bilateral hand posturing tremor   REFLEXES: Reflexes are hypoactive and symmetric at the biceps, triceps, knees, and ankles. Plantar responses are flexor.  SENSORY: Length dependent decreased to light touch, pinprick, and vibratory sensation to knee level  COORDINATION: There was no trunk, dysmetria noted   GAIT/STANCE: Deferred   DIAGNOSTIC DATA (LABS, IMAGING, TESTING) - I reviewed patient records, labs, notes, testing and imaging myself where available.   ASSESSMENT AND PLAN  Mirrah Striano is a 86 y.o. female   Bilateral hands tremor  Most consistent with essential tremor,  Laboratory evaluations in September 2020 showed normal B12, TSH,  Dementia  Continues to decline  Mini-Mental Status Examination of 17 out of 30 today  MRI of the brain in October  2020 showed chronic lacunar infarction in the right upper pons, right lentiform nuclear, extensive supratentorial and small vessel disease,  Cerebrovascular disease  She has vascular risk factor of aging, atrial fibrillation, not candidate for anticoagulation, on aspirin alone, hyperlipidemia, hypertension,  Chronic pain,  Related to her arthritis, joint deformity, will change gabapentin to 800 mg 3 times a day, instead of 4 times a day (per patient's son, insurance would not pay for 4 times a day)  Marcial Pacas, M.D. Ph.D.  [facility] [address], [place], [place] [address] Ph: [phone] Fax: [phone]  CC: [name], MD

## 2019-07-18 ENCOUNTER — Other Ambulatory Visit: Payer: Self-pay | Admitting: Family Medicine

## 2019-07-19 DIAGNOSIS — I5031 Acute diastolic (congestive) heart failure: Secondary | ICD-10-CM | POA: Diagnosis not present

## 2019-07-19 DIAGNOSIS — I110 Hypertensive heart disease with heart failure: Secondary | ICD-10-CM | POA: Diagnosis not present

## 2019-07-19 DIAGNOSIS — I2510 Atherosclerotic heart disease of native coronary artery without angina pectoris: Secondary | ICD-10-CM | POA: Diagnosis not present

## 2019-07-19 DIAGNOSIS — F0390 Unspecified dementia without behavioral disturbance: Secondary | ICD-10-CM | POA: Diagnosis not present

## 2019-07-19 DIAGNOSIS — G894 Chronic pain syndrome: Secondary | ICD-10-CM | POA: Diagnosis not present

## 2019-07-19 DIAGNOSIS — I482 Chronic atrial fibrillation, unspecified: Secondary | ICD-10-CM | POA: Diagnosis not present

## 2019-07-22 ENCOUNTER — Other Ambulatory Visit: Payer: Self-pay | Admitting: *Deleted

## 2019-07-22 DIAGNOSIS — I482 Chronic atrial fibrillation, unspecified: Secondary | ICD-10-CM | POA: Diagnosis not present

## 2019-07-22 DIAGNOSIS — I5031 Acute diastolic (congestive) heart failure: Secondary | ICD-10-CM | POA: Diagnosis not present

## 2019-07-22 DIAGNOSIS — I2510 Atherosclerotic heart disease of native coronary artery without angina pectoris: Secondary | ICD-10-CM | POA: Diagnosis not present

## 2019-07-22 DIAGNOSIS — F0390 Unspecified dementia without behavioral disturbance: Secondary | ICD-10-CM | POA: Diagnosis not present

## 2019-07-22 DIAGNOSIS — G894 Chronic pain syndrome: Secondary | ICD-10-CM | POA: Diagnosis not present

## 2019-07-22 DIAGNOSIS — I110 Hypertensive heart disease with heart failure: Secondary | ICD-10-CM | POA: Diagnosis not present

## 2019-07-22 MED ORDER — GABAPENTIN 800 MG PO TABS: 800.0000 mg | ORAL_TABLET | Freq: Three times a day (TID) | ORAL | 4 refills | Status: DC

## 2019-07-22 NOTE — Telephone Encounter (Signed)
[facility] called to clarify pts gabapentin's directions states it has two sets of directions and they were wanting to confirm which directions were correct for pts prescription  Cb#[phone]

## 2019-07-22 NOTE — Telephone Encounter (Signed)
I returned the call to the pharmacy and spoke to [place]. The gabapentin 300mg , one cap TID dosage was confirmed with her.

## 2019-07-24 DIAGNOSIS — I482 Chronic atrial fibrillation, unspecified: Secondary | ICD-10-CM | POA: Diagnosis not present

## 2019-07-24 DIAGNOSIS — F0390 Unspecified dementia without behavioral disturbance: Secondary | ICD-10-CM | POA: Diagnosis not present

## 2019-07-24 DIAGNOSIS — I5031 Acute diastolic (congestive) heart failure: Secondary | ICD-10-CM | POA: Diagnosis not present

## 2019-07-24 DIAGNOSIS — I110 Hypertensive heart disease with heart failure: Secondary | ICD-10-CM | POA: Diagnosis not present

## 2019-07-24 DIAGNOSIS — I2510 Atherosclerotic heart disease of native coronary artery without angina pectoris: Secondary | ICD-10-CM | POA: Diagnosis not present

## 2019-07-24 DIAGNOSIS — G894 Chronic pain syndrome: Secondary | ICD-10-CM | POA: Diagnosis not present

## 2019-07-25 ENCOUNTER — Telehealth: Payer: Self-pay | Admitting: *Deleted

## 2019-07-25 NOTE — Telephone Encounter (Signed)
Received VM from [place], [place] with [facility]. [phone]~ telephone.   Contacted Ria Comment to follow up. Reports that patient is having increased difficulty with swallowing. Requested order to add ST evaluation.   VO given.

## 2019-07-26 ENCOUNTER — Ambulatory Visit (HOSPITAL_COMMUNITY)
Admission: RE | Admit: 2019-07-26 | Discharge: 2019-07-26 | Disposition: A | Discharge status: Home / Self Care | Payer: Medicare Other | Source: Ambulatory Visit | Attending: Neurology | Admitting: Neurology

## 2019-07-26 ENCOUNTER — Other Ambulatory Visit: Payer: Self-pay

## 2019-07-26 DIAGNOSIS — I6322 Cerebral infarction due to unspecified occlusion or stenosis of basilar arteries: Secondary | ICD-10-CM | POA: Diagnosis not present

## 2019-07-26 DIAGNOSIS — R251 Tremor, unspecified: Secondary | ICD-10-CM | POA: Insufficient documentation

## 2019-07-26 DIAGNOSIS — R269 Unspecified abnormalities of gait and mobility: Secondary | ICD-10-CM | POA: Diagnosis not present

## 2019-07-26 DIAGNOSIS — R413 Other amnesia: Secondary | ICD-10-CM | POA: Diagnosis not present

## 2019-07-26 NOTE — Progress Notes (Signed)
Carotid duplex has been completed.   Preliminary results in CV Proc.   Abram Sander 07/26/2019 2:14 PM

## 2019-07-29 ENCOUNTER — Telehealth: Payer: Self-pay | Admitting: Neurology

## 2019-07-29 NOTE — Telephone Encounter (Signed)
Right Carotid: Velocities in the right ICA are consistent with a 1-39% stenosis.  Left Carotid: Velocities in the left ICA are consistent with a 1-39% stenosis.  Vertebrals: Bilateral vertebral arteries were not visualized.  *See table(s) above for measurements and observations.   Please call patient, ultrasound of carotid arteries showed less than 39% stenosis bilaterally.

## 2019-07-29 NOTE — Telephone Encounter (Signed)
Left patient a detailed message, with results, on son's voicemail (ok per DPR). Provided our number to call back with any questions.

## 2019-07-30 DIAGNOSIS — F0390 Unspecified dementia without behavioral disturbance: Secondary | ICD-10-CM | POA: Diagnosis not present

## 2019-07-30 DIAGNOSIS — G894 Chronic pain syndrome: Secondary | ICD-10-CM | POA: Diagnosis not present

## 2019-07-30 DIAGNOSIS — I2510 Atherosclerotic heart disease of native coronary artery without angina pectoris: Secondary | ICD-10-CM | POA: Diagnosis not present

## 2019-07-30 DIAGNOSIS — I5031 Acute diastolic (congestive) heart failure: Secondary | ICD-10-CM | POA: Diagnosis not present

## 2019-07-30 DIAGNOSIS — I110 Hypertensive heart disease with heart failure: Secondary | ICD-10-CM | POA: Diagnosis not present

## 2019-07-30 DIAGNOSIS — I482 Chronic atrial fibrillation, unspecified: Secondary | ICD-10-CM | POA: Diagnosis not present

## 2019-07-31 DIAGNOSIS — G894 Chronic pain syndrome: Secondary | ICD-10-CM | POA: Diagnosis not present

## 2019-07-31 DIAGNOSIS — F0390 Unspecified dementia without behavioral disturbance: Secondary | ICD-10-CM | POA: Diagnosis not present

## 2019-07-31 DIAGNOSIS — I2510 Atherosclerotic heart disease of native coronary artery without angina pectoris: Secondary | ICD-10-CM | POA: Diagnosis not present

## 2019-07-31 DIAGNOSIS — I482 Chronic atrial fibrillation, unspecified: Secondary | ICD-10-CM | POA: Diagnosis not present

## 2019-07-31 DIAGNOSIS — I110 Hypertensive heart disease with heart failure: Secondary | ICD-10-CM | POA: Diagnosis not present

## 2019-07-31 DIAGNOSIS — I5031 Acute diastolic (congestive) heart failure: Secondary | ICD-10-CM | POA: Diagnosis not present

## 2019-08-01 ENCOUNTER — Ambulatory Visit: Payer: Medicare Other | Admitting: Neurology

## 2019-08-01 DIAGNOSIS — I482 Chronic atrial fibrillation, unspecified: Secondary | ICD-10-CM | POA: Diagnosis not present

## 2019-08-01 DIAGNOSIS — I5031 Acute diastolic (congestive) heart failure: Secondary | ICD-10-CM | POA: Diagnosis not present

## 2019-08-01 DIAGNOSIS — I110 Hypertensive heart disease with heart failure: Secondary | ICD-10-CM | POA: Diagnosis not present

## 2019-08-01 DIAGNOSIS — F0390 Unspecified dementia without behavioral disturbance: Secondary | ICD-10-CM | POA: Diagnosis not present

## 2019-08-01 DIAGNOSIS — G894 Chronic pain syndrome: Secondary | ICD-10-CM | POA: Diagnosis not present

## 2019-08-01 DIAGNOSIS — I2510 Atherosclerotic heart disease of native coronary artery without angina pectoris: Secondary | ICD-10-CM | POA: Diagnosis not present

## 2019-08-05 DIAGNOSIS — F0390 Unspecified dementia without behavioral disturbance: Secondary | ICD-10-CM | POA: Diagnosis not present

## 2019-08-05 DIAGNOSIS — I2510 Atherosclerotic heart disease of native coronary artery without angina pectoris: Secondary | ICD-10-CM | POA: Diagnosis not present

## 2019-08-05 DIAGNOSIS — I110 Hypertensive heart disease with heart failure: Secondary | ICD-10-CM | POA: Diagnosis not present

## 2019-08-05 DIAGNOSIS — I5031 Acute diastolic (congestive) heart failure: Secondary | ICD-10-CM | POA: Diagnosis not present

## 2019-08-05 DIAGNOSIS — I482 Chronic atrial fibrillation, unspecified: Secondary | ICD-10-CM | POA: Diagnosis not present

## 2019-08-05 DIAGNOSIS — G894 Chronic pain syndrome: Secondary | ICD-10-CM | POA: Diagnosis not present

## 2019-08-06 DIAGNOSIS — G894 Chronic pain syndrome: Secondary | ICD-10-CM | POA: Diagnosis not present

## 2019-08-06 DIAGNOSIS — I110 Hypertensive heart disease with heart failure: Secondary | ICD-10-CM | POA: Diagnosis not present

## 2019-08-06 DIAGNOSIS — F0390 Unspecified dementia without behavioral disturbance: Secondary | ICD-10-CM | POA: Diagnosis not present

## 2019-08-06 DIAGNOSIS — I482 Chronic atrial fibrillation, unspecified: Secondary | ICD-10-CM | POA: Diagnosis not present

## 2019-08-06 DIAGNOSIS — I2510 Atherosclerotic heart disease of native coronary artery without angina pectoris: Secondary | ICD-10-CM | POA: Diagnosis not present

## 2019-08-06 DIAGNOSIS — I5031 Acute diastolic (congestive) heart failure: Secondary | ICD-10-CM | POA: Diagnosis not present

## 2019-08-09 DIAGNOSIS — F0390 Unspecified dementia without behavioral disturbance: Secondary | ICD-10-CM | POA: Diagnosis not present

## 2019-08-09 DIAGNOSIS — G894 Chronic pain syndrome: Secondary | ICD-10-CM | POA: Diagnosis not present

## 2019-08-09 DIAGNOSIS — I5031 Acute diastolic (congestive) heart failure: Secondary | ICD-10-CM | POA: Diagnosis not present

## 2019-08-09 DIAGNOSIS — I2510 Atherosclerotic heart disease of native coronary artery without angina pectoris: Secondary | ICD-10-CM | POA: Diagnosis not present

## 2019-08-09 DIAGNOSIS — I482 Chronic atrial fibrillation, unspecified: Secondary | ICD-10-CM | POA: Diagnosis not present

## 2019-08-09 DIAGNOSIS — I110 Hypertensive heart disease with heart failure: Secondary | ICD-10-CM | POA: Diagnosis not present

## 2019-08-12 DIAGNOSIS — I2510 Atherosclerotic heart disease of native coronary artery without angina pectoris: Secondary | ICD-10-CM | POA: Diagnosis not present

## 2019-08-12 DIAGNOSIS — I110 Hypertensive heart disease with heart failure: Secondary | ICD-10-CM | POA: Diagnosis not present

## 2019-08-12 DIAGNOSIS — F0390 Unspecified dementia without behavioral disturbance: Secondary | ICD-10-CM | POA: Diagnosis not present

## 2019-08-12 DIAGNOSIS — I482 Chronic atrial fibrillation, unspecified: Secondary | ICD-10-CM | POA: Diagnosis not present

## 2019-08-12 DIAGNOSIS — G894 Chronic pain syndrome: Secondary | ICD-10-CM | POA: Diagnosis not present

## 2019-08-12 DIAGNOSIS — I5031 Acute diastolic (congestive) heart failure: Secondary | ICD-10-CM | POA: Diagnosis not present

## 2019-08-14 DIAGNOSIS — I5031 Acute diastolic (congestive) heart failure: Secondary | ICD-10-CM | POA: Diagnosis not present

## 2019-08-14 DIAGNOSIS — G894 Chronic pain syndrome: Secondary | ICD-10-CM | POA: Diagnosis not present

## 2019-08-14 DIAGNOSIS — F0390 Unspecified dementia without behavioral disturbance: Secondary | ICD-10-CM | POA: Diagnosis not present

## 2019-08-14 DIAGNOSIS — I110 Hypertensive heart disease with heart failure: Secondary | ICD-10-CM | POA: Diagnosis not present

## 2019-08-14 DIAGNOSIS — I482 Chronic atrial fibrillation, unspecified: Secondary | ICD-10-CM | POA: Diagnosis not present

## 2019-08-14 DIAGNOSIS — I2510 Atherosclerotic heart disease of native coronary artery without angina pectoris: Secondary | ICD-10-CM | POA: Diagnosis not present

## 2019-08-15 ENCOUNTER — Telehealth: Payer: Self-pay | Admitting: Family Medicine

## 2019-08-15 NOTE — Telephone Encounter (Signed)
Do they mean a swallowing study.  Doubt hey meant egd.  Ok with swallowing study order.

## 2019-08-15 NOTE — Telephone Encounter (Signed)
Audelia Acton call would like to have a referral for a speech therapy [phone]

## 2019-08-15 NOTE — Telephone Encounter (Signed)
Pt son Audelia Acton call his mother speech therapist recommended a referral to a Endoscopy wasn't sure if Dr. Dennard Schaumann would send  a referral or would the pt have to schedule appt.

## 2019-08-15 NOTE — Telephone Encounter (Signed)
OK to order 

## 2019-08-16 DIAGNOSIS — G629 Polyneuropathy, unspecified: Secondary | ICD-10-CM | POA: Diagnosis not present

## 2019-08-16 DIAGNOSIS — I5031 Acute diastolic (congestive) heart failure: Secondary | ICD-10-CM | POA: Diagnosis not present

## 2019-08-16 DIAGNOSIS — M069 Rheumatoid arthritis, unspecified: Secondary | ICD-10-CM | POA: Diagnosis not present

## 2019-08-16 DIAGNOSIS — E785 Hyperlipidemia, unspecified: Secondary | ICD-10-CM | POA: Diagnosis not present

## 2019-08-16 DIAGNOSIS — F329 Major depressive disorder, single episode, unspecified: Secondary | ICD-10-CM | POA: Diagnosis not present

## 2019-08-16 DIAGNOSIS — Z7982 Long term (current) use of aspirin: Secondary | ICD-10-CM | POA: Diagnosis not present

## 2019-08-16 DIAGNOSIS — M810 Age-related osteoporosis without current pathological fracture: Secondary | ICD-10-CM | POA: Diagnosis not present

## 2019-08-16 DIAGNOSIS — F0390 Unspecified dementia without behavioral disturbance: Secondary | ICD-10-CM | POA: Diagnosis not present

## 2019-08-16 DIAGNOSIS — G4733 Obstructive sleep apnea (adult) (pediatric): Secondary | ICD-10-CM | POA: Diagnosis not present

## 2019-08-16 DIAGNOSIS — I482 Chronic atrial fibrillation, unspecified: Secondary | ICD-10-CM | POA: Diagnosis not present

## 2019-08-16 DIAGNOSIS — G894 Chronic pain syndrome: Secondary | ICD-10-CM | POA: Diagnosis not present

## 2019-08-16 DIAGNOSIS — I110 Hypertensive heart disease with heart failure: Secondary | ICD-10-CM | POA: Diagnosis not present

## 2019-08-16 DIAGNOSIS — I714 Abdominal aortic aneurysm, without rupture: Secondary | ICD-10-CM | POA: Diagnosis not present

## 2019-08-16 DIAGNOSIS — I2510 Atherosclerotic heart disease of native coronary artery without angina pectoris: Secondary | ICD-10-CM | POA: Diagnosis not present

## 2019-08-16 NOTE — Telephone Encounter (Signed)
LMTRC

## 2019-08-19 DIAGNOSIS — I2510 Atherosclerotic heart disease of native coronary artery without angina pectoris: Secondary | ICD-10-CM | POA: Diagnosis not present

## 2019-08-19 DIAGNOSIS — I110 Hypertensive heart disease with heart failure: Secondary | ICD-10-CM | POA: Diagnosis not present

## 2019-08-19 DIAGNOSIS — I5031 Acute diastolic (congestive) heart failure: Secondary | ICD-10-CM | POA: Diagnosis not present

## 2019-08-19 DIAGNOSIS — I482 Chronic atrial fibrillation, unspecified: Secondary | ICD-10-CM | POA: Diagnosis not present

## 2019-08-19 DIAGNOSIS — G894 Chronic pain syndrome: Secondary | ICD-10-CM | POA: Diagnosis not present

## 2019-08-19 DIAGNOSIS — F0390 Unspecified dementia without behavioral disturbance: Secondary | ICD-10-CM | POA: Diagnosis not present

## 2019-08-20 DIAGNOSIS — F0390 Unspecified dementia without behavioral disturbance: Secondary | ICD-10-CM | POA: Diagnosis not present

## 2019-08-20 DIAGNOSIS — G894 Chronic pain syndrome: Secondary | ICD-10-CM | POA: Diagnosis not present

## 2019-08-20 DIAGNOSIS — I110 Hypertensive heart disease with heart failure: Secondary | ICD-10-CM | POA: Diagnosis not present

## 2019-08-20 DIAGNOSIS — I2510 Atherosclerotic heart disease of native coronary artery without angina pectoris: Secondary | ICD-10-CM | POA: Diagnosis not present

## 2019-08-20 DIAGNOSIS — I5031 Acute diastolic (congestive) heart failure: Secondary | ICD-10-CM | POA: Diagnosis not present

## 2019-08-20 DIAGNOSIS — I482 Chronic atrial fibrillation, unspecified: Secondary | ICD-10-CM | POA: Diagnosis not present

## 2019-08-22 DIAGNOSIS — I482 Chronic atrial fibrillation, unspecified: Secondary | ICD-10-CM | POA: Diagnosis not present

## 2019-08-22 DIAGNOSIS — I5031 Acute diastolic (congestive) heart failure: Secondary | ICD-10-CM | POA: Diagnosis not present

## 2019-08-22 DIAGNOSIS — G894 Chronic pain syndrome: Secondary | ICD-10-CM | POA: Diagnosis not present

## 2019-08-22 DIAGNOSIS — I2510 Atherosclerotic heart disease of native coronary artery without angina pectoris: Secondary | ICD-10-CM | POA: Diagnosis not present

## 2019-08-22 DIAGNOSIS — I110 Hypertensive heart disease with heart failure: Secondary | ICD-10-CM | POA: Diagnosis not present

## 2019-08-22 DIAGNOSIS — F0390 Unspecified dementia without behavioral disturbance: Secondary | ICD-10-CM | POA: Diagnosis not present

## 2019-08-26 NOTE — Telephone Encounter (Signed)
No return call will close chart  ?

## 2019-08-27 DIAGNOSIS — I2510 Atherosclerotic heart disease of native coronary artery without angina pectoris: Secondary | ICD-10-CM | POA: Diagnosis not present

## 2019-08-27 DIAGNOSIS — F0390 Unspecified dementia without behavioral disturbance: Secondary | ICD-10-CM | POA: Diagnosis not present

## 2019-08-27 DIAGNOSIS — G894 Chronic pain syndrome: Secondary | ICD-10-CM | POA: Diagnosis not present

## 2019-08-27 DIAGNOSIS — I110 Hypertensive heart disease with heart failure: Secondary | ICD-10-CM | POA: Diagnosis not present

## 2019-08-27 DIAGNOSIS — I5031 Acute diastolic (congestive) heart failure: Secondary | ICD-10-CM | POA: Diagnosis not present

## 2019-08-27 DIAGNOSIS — I482 Chronic atrial fibrillation, unspecified: Secondary | ICD-10-CM | POA: Diagnosis not present

## 2019-08-27 NOTE — Progress Notes (Signed)
HPI: FU atrial fibrillation and abnormal functional study. I intitially saw in January of 2013 for an abnormal electrocardiogram. She had documented dynamic anterior T-wave changes. Myoview was performed in January 2013 and showed mild reversible defect at the apex. This was felt to possibly represent ischemia. Ejection fraction was 77%. I reviewed and felt low risk. Treated medically. Abdominal CT October 2019 showed 3.9 cm abdominal aortic aneurysm.  Patient was seen with atrial fibrillation March 2019.  She has fallen several times and therefore anticoagulation discontinued. She has had multiple previous admissions after falls and with encephalopathy; also with renal calculi. Echocardiogram March 2021 showed normal LV function, severe left ventricular hypertrophy and mildly dilated aortic root; consider amyloid. Carotid Dopplers March 2021 showed 1 to 39% bilateral stenosis.  CTA March 2021 showed no pulmonary embolus, 3.7 cm dilatation of descending thoracic aorta, 3.4 cm dilatation of the aortic arch.  Admitted March 2021 with diagnosis of acute diastolic congestive heart failure.  Troponins were negative.  Patient gently diuresed.  Since I last saw her,  she denies dyspnea, chest pain, palpitations or syncope.  She has occasional dizziness with standing.  She did fall recently and has pain in the right rib area.  Current Outpatient Medications  Medication Sig Dispense Refill  . acetaminophen (TYLENOL) 500 MG tablet Take 500 mg by mouth every 8 (eight) hours as needed for mild pain.     Marland Kitchen ammonium lactate (AMLACTIN) 12 % cream Apply topically as needed for dry skin. 385 g 0  . aspirin EC 81 MG tablet Take 81 mg by mouth daily.    Marland Kitchen atenolol (TENORMIN) 50 MG tablet TAKE 1 TABLET BY MOUTH DAILY (Patient taking differently: Take 50 mg by mouth daily. ) 90 tablet 3  . Calcium Citrate-Vitamin D (CALCIUM + D PO) Take 1 tablet by mouth daily. 600/400    . Cholecalciferol 50 MCG (2000 UT) TABS Take  by mouth.    . diclofenac sodium (VOLTAREN) 1 % GEL Apply 2 g topically 4 (four) times daily. Rub into affected area of foot 2 to 4 times daily 100 g 2  . DULoxetine (CYMBALTA) 30 MG capsule Take 30 mg by mouth daily. Takes along with 60mg  for total daily dose of 90mg .  2  . DULoxetine (CYMBALTA) 60 MG capsule Take 60 mg by mouth daily. Take along with 30mg  for a total daily dose of 90mg .    . gabapentin (NEURONTIN) 800 MG tablet Take 1 tablet (800 mg total) by mouth 3 (three) times daily. 270 tablet 4  . oxyCODONE-acetaminophen (PERCOCET) 7.5-325 MG tablet Take 1 tablet by mouth every 8 (eight) hours as needed for severe pain. 90 tablet 0  . pantoprazole (PROTONIX) 40 MG tablet TAKE 1 TABLET BY MOUTH ONCE DAILY 90 tablet 3  . rosuvastatin (CRESTOR) 40 MG tablet Take 1 tablet (40 mg total) by mouth every evening. 90 tablet 3   No current facility-administered medications for this visit.     Past Medical History:  Diagnosis Date  . [name] (abdominal aortic aneurysm) ([place]) 09/2013   3.6 cm  . Acute and chronic respiratory failure with hypoxia ([place])   . Acute delirium   . Acute encephalopathy   . Acute pyelonephritis   . Atrial fibrillation ([place])   . Chronic pain syndrome   . Constipation   . Dementia ([place])   . Dependent edema    bilateral legs   . Depression   . Diabetes mellitus    pre-diabetes  .  Diverticulosis   . Diverticulosis   . Dysuria   . E coli bacteremia   . Fall   . Gross hematuria   . Hydronephrosis with renal and ureteral calculous obstruction   . Hyperlipidemia   . Hypertension   . Hypoxemia   . Idiopathic peripheral autonomic neuropathy   . Infestation by bed bug   . Nephrolithiasis   . OSA on CPAP   . Osteoarthritis   . Osteopenia    T=  -2.1 in hip  . Persistent mood (affective) disorder, unspecified ([place])   . Prediabetes   . Rheumatoid arthritis ([place])   . Rheumatoid arthritis(714.0)   . S/P carpal tunnel release   . Sepsis secondary to UTI ([place])   .  Spinal stenosis   . Unspecified fracture of fifth lumbar vertebra, initial encounter for closed fracture ([place])   . Unspecified fracture of t11-T12 vertebra, initial encounter for closed fracture ([place])   . Urinary incontinence   . Urinary tract infection    hx of   . Urolithiasis     Past Surgical History:  Procedure Laterality Date  . APPENDECTOMY    . CARPAL TUNNEL RELEASE  12/27/2006   Right subcutaneous ulnar nerve transfer -- Right open carpal tunnel release  . CHOLECYSTECTOMY    . CYSTOSCOPY W/ URETERAL STENT PLACEMENT Right 01/30/2018   Procedure: CYSTOSCOPY WITH STENT REPLACEMENT retrograde pylegram;  Surgeon: Ardis Hughs, MD;  [facility]: [facility];  Service: Urology;  Laterality: Right;  . CYSTOSCOPY WITH URETEROSCOPY AND STENT PLACEMENT Right 03/01/2018   Procedure: CYSTOSCPY/RIGHT URETEROSCOPY HOLMIUM LASER LITHOTRIPSY AND STENT EXCHANGE;  Surgeon: Ardis Hughs, MD;  [facility]: [facility];  Service: Urology;  Laterality: Right;  . FOOT SURGERY    . HOLMIUM LASER APPLICATION Right [id number]   Procedure: HOLMIUM LASER APPLICATION;  Surgeon: Ardis Hughs, MD;  [facility]: [facility];  Service: Urology;  Laterality: Right;  . JOINT REPLACEMENT     BTKR, RTHR  . TOTAL KNEE ARTHROPLASTY  08/21/2007   Right total knee replacement    Social History   Socioeconomic History  . Marital status: Divorced    Spouse name: Not on file  . Number of children: 6  . Years of education: 8th grade  . Highest education level: Not on file  Occupational History  . Occupation: Retired  Tobacco Use  . Smoking status: Never Smoker  . Smokeless tobacco: Never Used  Substance and Sexual Activity  . Alcohol use: No  . Drug use: No  . Sexual activity: Not on file  Other Topics Concern  . Not on file  Social History Narrative   Lives at home with her sons.   She has four living children.   Right-handed.   Two cups caffeine per day.   Social Determinants of Health   Financial  Resource Strain:   . Difficulty of Paying Living Expenses:   Food Insecurity:   . Worried About Charity fundraiser in the Last Year:   . Arboriculturist in the Last Year:   Transportation Needs:   . Film/video editor (Medical):   Marland Kitchen Lack of Transportation (Non-Medical):   Physical Activity:   . Days of Exercise per Week:   . Minutes of Exercise per Session:   Stress:   . Feeling of Stress :   Social Connections:   . Frequency of Communication with Friends and Family:   . Frequency of Social Gatherings with Friends and Family:   . Attends Religious  Services:   . Active Member of Clubs or Organizations:   . Attends Archivist Meetings:   Marland Kitchen Marital Status:   Intimate Partner Violence:   . Fear of Current or Ex-Partner:   . Emotionally Abused:   Marland Kitchen Physically Abused:   . Sexually Abused:     Family History  Problem Relation Age of Onset  . Stroke Father   . Coronary artery disease Son        CABG at age 78  . Heart disease Son   . Heart attack Son   . Stroke Mother   . Hypertension Sister   . Diabetes Brother   . Hypertension Maternal Aunt     ROS: no fevers or chills, productive cough, hemoptysis, dysphasia, odynophagia, melena, hematochezia, dysuria, hematuria, rash, seizure activity, orthopnea, PND, pedal edema, claudication. Remaining systems are negative.  Physical Exam: Well-developed well-nourished in no acute distress.  Skin is warm and dry.  HEENT is normal.  Neck is supple.  Chest is clear to auscultation with normal expansion.  Cardiovascular exam is irregular Abdominal exam nontender or distended. No masses palpated. Extremities show no edema. neuro grossly intact  A/P  1 permanent atrial fibrillation-plan to continue atenolol at present dose for rate control.  She has multiple falls in the past and is felt not to be a candidate for anticoagulation.  2 prior abnormal nuclear study-previous abnormality felt possibly secondary to shifting  breast attenuation.  She has not had chest pain.  During recent admission enzymes were negative.  We will continue medical therapy with aspirin and statin.  3 abdominal aortic aneurysm-we will arrange follow-up ultrasound.  4 hypertension-blood pressure controlled.  Continue present medications.  5 hyperlipidemia-continue statin.  6 chronic diastolic/right CHF-continue Lasix as needed.  Kirk Ruths, MD

## 2019-08-29 ENCOUNTER — Other Ambulatory Visit: Payer: Self-pay

## 2019-08-29 ENCOUNTER — Ambulatory Visit (INDEPENDENT_AMBULATORY_CARE_PROVIDER_SITE_OTHER): Payer: Medicare Other | Admitting: Cardiology

## 2019-08-29 ENCOUNTER — Encounter: Payer: Self-pay | Admitting: Cardiology

## 2019-08-29 VITALS — BP 118/82 | HR 88 | Ht 64.0 in | Wt 174.0 lb

## 2019-08-29 DIAGNOSIS — I10 Essential (primary) hypertension: Secondary | ICD-10-CM

## 2019-08-29 DIAGNOSIS — I714 Abdominal aortic aneurysm, without rupture, unspecified: Secondary | ICD-10-CM

## 2019-08-29 DIAGNOSIS — I482 Chronic atrial fibrillation, unspecified: Secondary | ICD-10-CM | POA: Diagnosis not present

## 2019-08-29 DIAGNOSIS — I2510 Atherosclerotic heart disease of native coronary artery without angina pectoris: Secondary | ICD-10-CM

## 2019-08-29 NOTE — Patient Instructions (Signed)
Medication Instructions:  NO CHANGE *If you need a refill on your cardiac medications before your next appointment, please call your pharmacy*   Lab Work: If you have labs (blood work) drawn today and your tests are completely normal, you will receive your results only by: Marland Kitchen MyChart Message (if you have MyChart) OR . A paper copy in the mail If you have any lab test that is abnormal or we need to change your treatment, we will call you to review the results.   Testing/Procedures: Your physician has requested that you have an abdominal aorta duplex. During this test, an ultrasound is used to evaluate the aorta. Allow 30 minutes for this exam. Do not eat after midnight the day before and avoid carbonated beverages [facility]   Follow-Up: At [facility], you and your health needs are our priority.  As part of our continuing mission to provide you with exceptional heart care, we have created designated Provider Care Teams.  These Care Teams include your primary Cardiologist (physician) and Advanced Practice Providers (APPs -  Physician Assistants and Nurse Practitioners) who all work together to provide you with the care you need, when you need it.  We recommend signing up for the patient portal called "MyChart".  Sign up information is provided on this After Visit Summary.  MyChart is used to connect with patients for Virtual Visits (Telemedicine).  Patients are able to view lab/test results, encounter notes, upcoming appointments, etc.  Non-urgent messages can be sent to your provider as well.   To learn more about what you can do with MyChart, go to [email or web address].    Your next appointment:   12 month(s)  The format for your next appointment:   Either In Person or Virtual  Provider:   You may see Kirk Ruths, MD or one of the following Advanced Practice Providers on your designated Care Team:    Kerin Ransom, PA-C  [place], [place]  Coletta Memos,  [place]

## 2019-09-05 ENCOUNTER — Ambulatory Visit (HOSPITAL_COMMUNITY): Payer: Medicare Other

## 2019-09-11 ENCOUNTER — Other Ambulatory Visit: Payer: Self-pay | Admitting: *Deleted

## 2019-09-11 ENCOUNTER — Other Ambulatory Visit: Payer: Self-pay

## 2019-09-11 ENCOUNTER — Encounter: Payer: Self-pay | Admitting: *Deleted

## 2019-09-11 ENCOUNTER — Ambulatory Visit (HOSPITAL_COMMUNITY)
Admission: RE | Admit: 2019-09-11 | Discharge: 2019-09-11 | Disposition: A | Discharge status: Home / Self Care | Payer: Medicare Other | Source: Ambulatory Visit | Attending: Cardiovascular Disease | Admitting: Cardiovascular Disease

## 2019-09-11 ENCOUNTER — Other Ambulatory Visit (HOSPITAL_COMMUNITY): Payer: Self-pay | Admitting: Cardiology

## 2019-09-11 DIAGNOSIS — I714 Abdominal aortic aneurysm, without rupture, unspecified: Secondary | ICD-10-CM

## 2019-09-11 DIAGNOSIS — G894 Chronic pain syndrome: Secondary | ICD-10-CM | POA: Diagnosis not present

## 2019-09-11 DIAGNOSIS — I110 Hypertensive heart disease with heart failure: Secondary | ICD-10-CM | POA: Diagnosis not present

## 2019-09-11 DIAGNOSIS — I2510 Atherosclerotic heart disease of native coronary artery without angina pectoris: Secondary | ICD-10-CM | POA: Diagnosis not present

## 2019-09-11 DIAGNOSIS — I482 Chronic atrial fibrillation, unspecified: Secondary | ICD-10-CM | POA: Diagnosis not present

## 2019-09-11 DIAGNOSIS — F0390 Unspecified dementia without behavioral disturbance: Secondary | ICD-10-CM | POA: Diagnosis not present

## 2019-09-11 DIAGNOSIS — I5031 Acute diastolic (congestive) heart failure: Secondary | ICD-10-CM | POA: Diagnosis not present

## 2019-10-22 ENCOUNTER — Ambulatory Visit (INDEPENDENT_AMBULATORY_CARE_PROVIDER_SITE_OTHER): Payer: Medicare Other | Admitting: Podiatry

## 2019-10-22 ENCOUNTER — Encounter: Payer: Self-pay | Admitting: Podiatry

## 2019-10-22 ENCOUNTER — Other Ambulatory Visit: Payer: Self-pay

## 2019-10-22 DIAGNOSIS — E1142 Type 2 diabetes mellitus with diabetic polyneuropathy: Secondary | ICD-10-CM

## 2019-10-22 DIAGNOSIS — M722 Plantar fascial fibromatosis: Secondary | ICD-10-CM

## 2019-10-22 DIAGNOSIS — M79676 Pain in unspecified toe(s): Secondary | ICD-10-CM

## 2019-10-22 DIAGNOSIS — I2510 Atherosclerotic heart disease of native coronary artery without angina pectoris: Secondary | ICD-10-CM

## 2019-10-22 DIAGNOSIS — B351 Tinea unguium: Secondary | ICD-10-CM | POA: Diagnosis not present

## 2019-10-22 NOTE — Progress Notes (Signed)
[name] presents today chief complaint of bilateral heel pain and elongated toenails.  Objective: Vital signs are stable she is alert and oriented x3 has severe diabetic peripheral neuropathy both strong palpable pulses bilateral.  No open lesions or wounds are noted.  Toenails are long thick yellow dystrophic-like mycotic she does have pain on palpation medial calcaneal tubercles bilaterally.  Assessment: Plantar fasciitis bilateral diabetic peripheral neuropathy.  Pain in limb secondary to onychomycosis.  Plan: Debridement of toenails 1 4 and 5 of the right foot 1 through 5 of the left foot.  Also injected the bilateral heels today 20 mg Kenalog 5 mg Marcaine point maximal tenderness at bilateral.  Tolerated procedure well without complications follow-up with her on an as-needed basis.

## 2019-12-26 ENCOUNTER — Other Ambulatory Visit: Payer: Self-pay | Admitting: Family Medicine

## 2019-12-26 DIAGNOSIS — I6381 Other cerebral infarction due to occlusion or stenosis of small artery: Secondary | ICD-10-CM

## 2019-12-31 ENCOUNTER — Ambulatory Visit: Payer: Medicare Other | Admitting: Podiatry

## 2020-01-03 ENCOUNTER — Other Ambulatory Visit: Payer: Self-pay

## 2020-01-03 DIAGNOSIS — I6381 Other cerebral infarction due to occlusion or stenosis of small artery: Secondary | ICD-10-CM

## 2020-01-03 MED ORDER — ROSUVASTATIN CALCIUM 40 MG PO TABS: 40.0000 mg | ORAL_TABLET | Freq: Every evening | ORAL | 3 refills | Status: DC

## 2020-01-03 NOTE — Telephone Encounter (Signed)
[name] doesn't see Dr. Orpah Melter anymore, her son is wanting to see if you'll refill her Duloxetine, if you feel she doesn't need to be on it he said you can take it off her med list.

## 2020-01-07 MED ORDER — DULOXETINE HCL 30 MG PO CPEP
30.0000 mg | ORAL_CAPSULE | Freq: Every day | ORAL | 2 refills | Status: DC
Start: 2020-01-07 — End: 2020-09-01

## 2020-01-07 MED ORDER — DULOXETINE HCL 60 MG PO CPEP
60.0000 mg | ORAL_CAPSULE | Freq: Every day | ORAL | 2 refills | Status: DC
Start: 2020-01-07 — End: 2020-09-01

## 2020-01-16 ENCOUNTER — Encounter: Payer: Self-pay | Admitting: Podiatry

## 2020-01-16 ENCOUNTER — Other Ambulatory Visit: Payer: Self-pay

## 2020-01-16 ENCOUNTER — Ambulatory Visit (INDEPENDENT_AMBULATORY_CARE_PROVIDER_SITE_OTHER): Payer: Medicare Other | Admitting: Podiatry

## 2020-01-16 DIAGNOSIS — E1142 Type 2 diabetes mellitus with diabetic polyneuropathy: Secondary | ICD-10-CM

## 2020-01-16 DIAGNOSIS — B351 Tinea unguium: Secondary | ICD-10-CM | POA: Diagnosis not present

## 2020-01-16 DIAGNOSIS — M79676 Pain in unspecified toe(s): Secondary | ICD-10-CM

## 2020-01-16 NOTE — Progress Notes (Signed)
She presents today chief complaint of painful toes bilaterally.  Objective: Vital signs are stable she is alert and oriented x3 pulses are palpable.  Toenails are long thick yellow dystrophic Lee mycotic 1 through 5 left 1 4 and 5 on the right foot.  Assessment: Diabetes mellitus diabetic peripheral neuropathy history of amputation and painful toenails.  Plan: Debridement of toenails bilaterally.

## 2020-01-21 ENCOUNTER — Ambulatory Visit: Payer: Medicare Other | Admitting: Neurology

## 2020-02-10 ENCOUNTER — Ambulatory Visit (INDEPENDENT_AMBULATORY_CARE_PROVIDER_SITE_OTHER): Payer: Medicare Other | Admitting: Podiatry

## 2020-02-10 ENCOUNTER — Other Ambulatory Visit: Payer: Self-pay

## 2020-02-10 DIAGNOSIS — B353 Tinea pedis: Secondary | ICD-10-CM | POA: Diagnosis not present

## 2020-02-10 DIAGNOSIS — R234 Changes in skin texture: Secondary | ICD-10-CM | POA: Diagnosis not present

## 2020-02-10 MED ORDER — MUPIROCIN 2 % EX OINT: TOPICAL_OINTMENT | CUTANEOUS | 2 refills | Status: DC

## 2020-02-10 MED ORDER — KETOCONAZOLE 2 % EX CREA: 1.0000 "application " | TOPICAL_CREAM | Freq: Every day | CUTANEOUS | 2 refills | Status: DC

## 2020-02-10 NOTE — Progress Notes (Signed)
  Subjective:  Patient ID: [name], female    DOB: 11/27/1933,  MRN: [id number]  Chief Complaint  Patient presents with  . Wound Check    Cut to the bottom of Left heel. It has been there for about 2 weeks it hurts to apply pressure on it     86 y.o. female presents with the above complaint. History confirmed with patient.  Here today with her son.  She is not diabetic.  They noticed the fissure which became very painful and had scabbing and mild bloody drainage.  Objective:  Physical Exam: warm, good capillary refill and normal DP and PT pulses.   There is a small 2 cm by 0.2 cm fissure in the plantar heel on the left foot that has a scab overlying it.  No signs of infection.  No drainage today.  Tinea pedis noted is marked distribution bilaterally plantar foot. Assessment:   1. Tinea pedis of left foot   2. Fissure in skin of foot      Plan:  Patient was evaluated and treated and all questions answered.  -The fissure is superficial and likely due to significant tinea pedis which is causing dry cracking scaling skin on the plantar foot.  I prescribed her ketoconazole for this.  Also prescribed mupirocin ointment to apply directly to the fissure daily.  Expect this will heal within a few weeks.  Follow-up in 3 to 4 weeks with myself or Dr. Milinda Pointer   Return in about 4 weeks (around 03/09/2020).

## 2020-02-11 ENCOUNTER — Encounter: Payer: Self-pay | Admitting: Podiatry

## 2020-02-18 ENCOUNTER — Telehealth: Payer: Self-pay

## 2020-02-18 NOTE — Telephone Encounter (Signed)
Pt's family member called today and stated that the pt is still waiting on two medication for her foot. Please advise

## 2020-03-10 ENCOUNTER — Ambulatory Visit: Payer: Medicare Other | Admitting: Podiatry

## 2020-03-17 ENCOUNTER — Encounter: Payer: Self-pay | Admitting: Neurology

## 2020-03-17 ENCOUNTER — Ambulatory Visit (INDEPENDENT_AMBULATORY_CARE_PROVIDER_SITE_OTHER): Payer: Medicare Other | Admitting: Neurology

## 2020-03-17 VITALS — BP 99/70 | HR 98 | Ht 65.0 in | Wt 172.0 lb

## 2020-03-17 DIAGNOSIS — I6322 Cerebral infarction due to unspecified occlusion or stenosis of basilar arteries: Secondary | ICD-10-CM

## 2020-03-17 DIAGNOSIS — R269 Unspecified abnormalities of gait and mobility: Secondary | ICD-10-CM

## 2020-03-17 DIAGNOSIS — R413 Other amnesia: Secondary | ICD-10-CM

## 2020-03-17 DIAGNOSIS — I6381 Other cerebral infarction due to occlusion or stenosis of small artery: Secondary | ICD-10-CM | POA: Diagnosis not present

## 2020-03-17 NOTE — Progress Notes (Signed)
PATIENT: [name] DOB: [birth year]  Chief Complaint  Patient presents with  . Follow-up    "having drum beating sounds in my head"  . Room 4    Son Kirk in room     HISTORICAL  [name] is a 86 year old female, seen in request by her primary care physician Dr. Jenna Luo for evaluation of bilateral hands tremor, initial evaluation was on January 24, 2019.  She is accompanied by her son Audelia Acton at today's clinic visit.  I have reviewed and summarized the referring note from the referring physician.  She had a past medical history of hypertension, hyperlipidemia, depression anxiety, rheumatoid arthritis,, multiple joint pain, bilateral shoulder pain, gradual onset memory loss.  atrial fibrillation, not on anti-coagulation, only on aspirin 81 mg daily.   She was admitted to hospital in December 2019, was treated for UTI, she was noted to have increased confusion, was discharged to rehabilitation following hospital admission, she was noted to have steady decline since then.  Now she lives at home with her sons, spent most of the time in her wheelchair, gait abnormality, complains of significant bilateral shoulder pain, multiple joints pain, she was noted to have gradual onset bilateral hands tremor, most noticeable in her dominant right hand, difficulty holding utensils.  There was no family history of tremor, her short term memory loss is gradually getting worse, today's Mini-Mental Status Examination 22/30 Laboratory evaluation in August 2020, CMP showed creatinine of 0.93, normal CBC,  UPDATE July 17 2019: She is accompanied by her son at today's clinical visit, she complains of diffuse body achy pain, by mistake, her pharmacy stopped her gabapentin 800 mg 4 times a day abruptly few weeks ago, since then, she had flareup of significant body achy pain despite Cymbalta 90 mg daily, and also taking Percocet 7.5/325 every 8 hours from her pain management, she was admitted  to the hospital in March 2703 for diastolic congestive heart failure,  I personally reviewed MRI of the brain without contrast February 01, 2016 2020: Chronic lacunar infarction in the op right pons, right lenticular form nuclear, extensive T2/flair hyper density lesions, consistent with severe chronic small vessel disease, moderate generalized atrophy,  I also reviewed her most recent hospital discharge in March 2021, for acute diastolic congestive heart failure, presented with shortness of breath, diffuse pain all over, she was treated with IV Lasix, with trending up of creatinine, and decreased blood pressure, CT angiogram showed no evidence of PE, four-vessel moderate to severe coronary artery calcification, ejection fraction 60 to 50%, diastolic function could not be evaluated, continue to be in chronic atrial fibrillation, on atenolol for rate control, not a candidate for anticoagulation, on aspirin 81 mg  UPDATE Mar 17 2020: She is accompanied by her son at today's clinical visit, she is now taking Cymbalta 90 mg daily, gabapentin 800 mg 3 times a day was added on, also taking Percocet as needed  She complains of frequent episode of drunk beating in her head, lasting for couple hours, usually improved by resting,   REVIEW OF SYSTEMS: Full 14 system review of systems performed and notable only for as above All other review of systems were negative.  ALLERGIES: Allergies  Allergen Reactions  . Sulfonamide Derivatives Hives and Rash    HOME MEDICATIONS: Current Outpatient Medications  Medication Sig Dispense Refill  . acetaminophen (TYLENOL) 500 MG tablet Take 500 mg by mouth every 8 (eight) hours as needed for mild pain.     Marland Kitchen  ammonium lactate (AMLACTIN) 12 % cream Apply topically as needed for dry skin. 385 g 0  . aspirin EC 81 MG tablet Take 81 mg by mouth daily.    Marland Kitchen atenolol (TENORMIN) 50 MG tablet TAKE 1 TABLET BY MOUTH DAILY (Patient taking differently: Take 50 mg by mouth daily.  ) 90 tablet 3  . Calcium Carbonate-Vitamin D3 600-400 MG-UNIT TABS Take 1 tablet by mouth daily.    . Calcium Citrate-Vitamin D (CALCIUM + D PO) Take 1 tablet by mouth daily. 600/400    . Cholecalciferol 50 MCG (2000 UT) TABS Take by mouth.    . diclofenac sodium (VOLTAREN) 1 % GEL Apply 2 g topically 4 (four) times daily. Rub into affected area of foot 2 to 4 times daily 100 g 2  . DULoxetine (CYMBALTA) 30 MG capsule Take 1 capsule (30 mg total) by mouth daily. Takes along with 60mg  for total daily dose of 90mg . 20 capsule 2  . DULoxetine (CYMBALTA) 60 MG capsule Take 1 capsule (60 mg total) by mouth daily. Take along with 30mg  for a total daily dose of 90mg . 20 capsule 2  . gabapentin (NEURONTIN) 800 MG tablet Take 1 tablet (800 mg total) by mouth 3 (three) times daily. 270 tablet 4  . ketoconazole (NIZORAL) 2 % cream Apply 1 application topically daily. 60 g 2  . oxyCODONE-acetaminophen (PERCOCET) 7.5-325 MG tablet Take 1 tablet by mouth every 8 (eight) hours as needed for severe pain. 90 tablet 0  . pantoprazole (PROTONIX) 40 MG tablet TAKE 1 TABLET BY MOUTH ONCE DAILY 90 tablet 3  . rosuvastatin (CRESTOR) 40 MG tablet Take 1 tablet (40 mg total) by mouth every evening. 30 tablet 3  . mupirocin ointment (BACTROBAN) 2 % Apply to wound twice a day. 30 g 2   No current facility-administered medications for this visit.    PAST MEDICAL HISTORY: Past Medical History:  Diagnosis Date  . [name] (abdominal aortic aneurysm) ([place]) 09/2013   3.6 cm  . Acute and chronic respiratory failure with hypoxia ([place])   . Acute delirium   . Acute encephalopathy   . Acute pyelonephritis   . Atrial fibrillation ([place])   . Chronic pain syndrome   . Constipation   . Dementia ([place])   . Dependent edema    bilateral legs   . Depression   . Diabetes mellitus    pre-diabetes  . Diverticulosis   . Diverticulosis   . Dysuria   . E coli bacteremia   . Fall   . Gross hematuria   . Hydronephrosis with renal and  ureteral calculous obstruction   . Hyperlipidemia   . Hypertension   . Hypoxemia   . Idiopathic peripheral autonomic neuropathy   . Infestation by bed bug   . Nephrolithiasis   . OSA on CPAP   . Osteoarthritis   . Osteopenia    T=  -2.1 in hip  . Persistent mood (affective) disorder, unspecified ([place])   . Prediabetes   . Rheumatoid arthritis ([place])   . Rheumatoid arthritis(714.0)   . S/P carpal tunnel release   . Sepsis secondary to UTI ([place])   . Spinal stenosis   . Unspecified fracture of fifth lumbar vertebra, initial encounter for closed fracture ([place])   . Unspecified fracture of t11-T12 vertebra, initial encounter for closed fracture ([place])   . Urinary incontinence   . Urinary tract infection    hx of   . Urolithiasis     PAST SURGICAL HISTORY: Past Surgical  History:  Procedure Laterality Date  . APPENDECTOMY    . CARPAL TUNNEL RELEASE  12/27/2006   Right subcutaneous ulnar nerve transfer -- Right open carpal tunnel release  . CHOLECYSTECTOMY    . CYSTOSCOPY W/ URETERAL STENT PLACEMENT Right 01/30/2018   Procedure: CYSTOSCOPY WITH STENT REPLACEMENT retrograde pylegram;  Surgeon: Ardis Hughs, MD;  Location: [facility];  Service: Urology;  Laterality: Right;  . CYSTOSCOPY WITH URETEROSCOPY AND STENT PLACEMENT Right 03/01/2018   Procedure: CYSTOSCPY/RIGHT URETEROSCOPY HOLMIUM LASER LITHOTRIPSY AND STENT EXCHANGE;  Surgeon: Ardis Hughs, MD;  Location: [facility];  Service: Urology;  Laterality: Right;  . FOOT SURGERY    . HOLMIUM LASER APPLICATION Right 85/27/7824   Procedure: HOLMIUM LASER APPLICATION;  Surgeon: Ardis Hughs, MD;  Location: [facility];  Service: Urology;  Laterality: Right;  . JOINT REPLACEMENT     BTKR, RTHR  . TOTAL KNEE ARTHROPLASTY  08/21/2007   Right total knee replacement    FAMILY HISTORY: Family History  Problem Relation Age of Onset  . Stroke Father   . Coronary artery disease Son        CABG at age 66  . Heart disease Son   .  Heart attack Son   . Stroke Mother   . Hypertension Sister   . Diabetes Brother   . Hypertension Maternal Aunt     SOCIAL HISTORY: Social History   Socioeconomic History  . Marital status: Divorced    Spouse name: Not on file  . Number of children: 6  . Years of education: 8th grade  . Highest education level: Not on file  Occupational History  . Occupation: Retired  Tobacco Use  . Smoking status: Never Smoker  . Smokeless tobacco: Never Used  Vaping Use  . Vaping Use: Never used  Substance and Sexual Activity  . Alcohol use: No  . Drug use: No  . Sexual activity: Not on file  Other Topics Concern  . Not on file  Social History Narrative   Lives at home with 2 sons   She has four living children.   Right-handed.   Two cups caffeine per day.   Social Determinants of Health   Financial Resource Strain:   . Difficulty of Paying Living Expenses: Not on file  Food Insecurity:   . Worried About Charity fundraiser in the Last Year: Not on file  . Ran Out of Food in the Last Year: Not on file  Transportation Needs:   . Lack of Transportation (Medical): Not on file  . Lack of Transportation (Non-Medical): Not on file  Physical Activity:   . Days of Exercise per Week: Not on file  . Minutes of Exercise per Session: Not on file  Stress:   . Feeling of Stress : Not on file  Social Connections:   . Frequency of Communication with Friends and Family: Not on file  . Frequency of Social Gatherings with Friends and Family: Not on file  . Attends Religious Services: Not on file  . Active Member of Clubs or Organizations: Not on file  . Attends Archivist Meetings: Not on file  . Marital Status: Not on file  Intimate Partner Violence:   . Fear of Current or Ex-Partner: Not on file  . Emotionally Abused: Not on file  . Physically Abused: Not on file  . Sexually Abused: Not on file     PHYSICAL EXAM   Vitals:   03/17/20 1258  BP: 99/70  Pulse:  98  Weight:  172 lb (78 kg)  Height: 5\' 5"  (1.651 m)   Not recorded     Body mass index is 28.62 kg/m.  PHYSICAL EXAMNIATION:  Gen: NAD, conversant, well nourised, well groomed                     Cardiovascular: Regular rate rhythm, no peripheral edema, warm, nontender. Eyes: Conjunctivae clear without exudates or hemorrhage Neck: Supple, no carotid bruits. Pulmonary: Clear to auscultation bilaterally   NEUROLOGICAL EXAM: She sits in wheelchair, depressed looking elderly female  MENTAL STATUS:  MMSE - Mini Mental State Exam 07/17/2019 01/24/2019  Orientation to time 4 4  Orientation to Place 4 4  Registration 3 3  Attention/ Calculation 0 3  Recall 0 1  Language- name 2 objects 2 2  Language- repeat 1 1  Language- follow 3 step command 3 3  Language- read & follow direction 1 1  Write a sentence 0 0  Copy design 0 0  Total score 18 22     CRANIAL NERVES: CN II: Visual fields are full to confrontation.  Pupils are round equal and briskly reactive to light. CN III, IV, VI: extraocular movement are normal. No ptosis. CN V: Facial sensation is intact to pinprick in all 3 divisions bilaterally. Corneal responses are intact.  CN VII: Face is symmetric with normal eye closure and smile. CN VIII: Hearing is normal to causal conversation. CN IX, X: Palate elevates symmetrically. Phonation is normal. CN XI: Head turning and shoulder shrug are intact   MOTOR: Deformity of multiple joints of bilateral hands, motor examination is limited by bilateral shoulder pain, joint pain, felt there was no significant distal muscle weakness or lower extremity muscles, she has mild bilateral hand posturing tremor   REFLEXES: Reflexes are hypoactive and symmetric at the biceps, triceps, knees, and ankles. Plantar responses are flexor.  SENSORY: Length dependent decreased to light touch, pinprick, and vibratory sensation to knee level  COORDINATION: There was no trunk, dysmetria  noted   GAIT/STANCE: Deferred   DIAGNOSTIC DATA (LABS, IMAGING, TESTING) - I reviewed patient records, labs, notes, testing and imaging myself where available.   ASSESSMENT AND PLAN  Kalii Chesmore is a 86 y.o. female   Bilateral hands tremor  Most consistent with essential tremor, or exaggerated physiological tremor  Laboratory evaluations in September 2020 showed normal B12, TSH,  Dementia  Continues to decline  MRI of the brain in October 2020 showed chronic lacunar infarction in the right upper pons, right lentiform nuclear, extensive supratentorial and small vessel disease,  Cerebrovascular disease  She has vascular risk factor of aging, atrial fibrillation, not candidate for anticoagulation, on aspirin alone, hyperlipidemia, hypertension,  Chronic pain, gait abnormality  Related to her arthritis, joint deformity, her diffuse body pain has much improved with higher dose of gabapentin 800 mg 3 times a day, instead of 4 times a day and Cymbalta 30+60 mg a day  Continue moderate exercise, follow-up with her primary care  Marcial Pacas, M.D. Ph.D.  [facility] [address], [place], [place] [address] Ph: [phone] Fax: [phone]  CC: [name], MD

## 2020-03-24 ENCOUNTER — Ambulatory Visit (INDEPENDENT_AMBULATORY_CARE_PROVIDER_SITE_OTHER): Payer: Medicare Other | Admitting: Podiatry

## 2020-03-24 ENCOUNTER — Other Ambulatory Visit: Payer: Self-pay

## 2020-03-24 DIAGNOSIS — I872 Venous insufficiency (chronic) (peripheral): Secondary | ICD-10-CM

## 2020-03-24 DIAGNOSIS — I6381 Other cerebral infarction due to occlusion or stenosis of small artery: Secondary | ICD-10-CM

## 2020-03-24 DIAGNOSIS — E1142 Type 2 diabetes mellitus with diabetic polyneuropathy: Secondary | ICD-10-CM

## 2020-03-24 DIAGNOSIS — M79674 Pain in right toe(s): Secondary | ICD-10-CM

## 2020-03-24 DIAGNOSIS — M79675 Pain in left toe(s): Secondary | ICD-10-CM | POA: Diagnosis not present

## 2020-03-24 MED ORDER — KETOCONAZOLE 2 % EX CREA
1.0000 | TOPICAL_CREAM | Freq: Every day | CUTANEOUS | 2 refills | Status: DC
Start: 2020-03-24 — End: 2020-05-22

## 2020-03-24 MED ORDER — MUPIROCIN 2 % EX OINT
TOPICAL_OINTMENT | CUTANEOUS | 2 refills | Status: DC
Start: 2020-03-24 — End: 2020-05-22

## 2020-03-24 NOTE — Progress Notes (Signed)
She presents today for follow-up of her laceration plantar heel left.  Her son states that they did not get the prescriptions because they are coated to the wrong pharmacy.  Objective: Vital signs are stable alert oriented x3.  Pulses are palpable.  However her capillary fill time is sluggish and she does have blanching to the toes.  This is concerning for small vessel disease of the digits.  The wounds have gone on to heal up though she does still retains of tinea pedis.  Assessment: Healing laceration of the plantar aspect of the left foot.  Peripheral vascular disease.  Tinea pedis.  Plan: At this point start her on Bactroban and ketoconazole.  Follow-up with her when she gets back from vascular studies.  We are going to request ABIs and toe pressures.

## 2020-03-30 ENCOUNTER — Encounter (HOSPITAL_COMMUNITY): Payer: Medicare Other

## 2020-04-03 ENCOUNTER — Other Ambulatory Visit (HOSPITAL_COMMUNITY): Payer: Self-pay | Admitting: Cardiology

## 2020-04-03 ENCOUNTER — Ambulatory Visit (INDEPENDENT_AMBULATORY_CARE_PROVIDER_SITE_OTHER)
Admission: RE | Admit: 2020-04-03 | Discharge: 2020-04-03 | Disposition: A | Discharge status: Home / Self Care | Payer: Medicare Other | Source: Ambulatory Visit | Attending: Podiatry | Admitting: Podiatry

## 2020-04-03 ENCOUNTER — Other Ambulatory Visit: Payer: Self-pay

## 2020-04-03 ENCOUNTER — Ambulatory Visit (HOSPITAL_COMMUNITY)
Admission: RE | Admit: 2020-04-03 | Discharge: 2020-04-03 | Disposition: A | Discharge status: Home / Self Care | Payer: Medicare Other | Source: Ambulatory Visit | Attending: Cardiovascular Disease | Admitting: Cardiovascular Disease

## 2020-04-03 DIAGNOSIS — E1142 Type 2 diabetes mellitus with diabetic polyneuropathy: Secondary | ICD-10-CM | POA: Diagnosis not present

## 2020-04-03 DIAGNOSIS — M79675 Pain in left toe(s): Secondary | ICD-10-CM | POA: Insufficient documentation

## 2020-04-03 DIAGNOSIS — I714 Abdominal aortic aneurysm, without rupture, unspecified: Secondary | ICD-10-CM

## 2020-04-03 DIAGNOSIS — I872 Venous insufficiency (chronic) (peripheral): Secondary | ICD-10-CM

## 2020-04-03 DIAGNOSIS — M79674 Pain in right toe(s): Secondary | ICD-10-CM | POA: Diagnosis not present

## 2020-04-07 ENCOUNTER — Encounter: Payer: Self-pay | Admitting: *Deleted

## 2020-04-07 ENCOUNTER — Other Ambulatory Visit: Payer: Self-pay

## 2020-04-07 ENCOUNTER — Ambulatory Visit (INDEPENDENT_AMBULATORY_CARE_PROVIDER_SITE_OTHER): Payer: Medicare Other | Admitting: Family Medicine

## 2020-04-07 VITALS — BP 138/76 | HR 94 | Temp 97.8°F | Resp 16 | Ht 65.0 in | Wt 175.0 lb

## 2020-04-07 DIAGNOSIS — R269 Unspecified abnormalities of gait and mobility: Secondary | ICD-10-CM | POA: Diagnosis not present

## 2020-04-07 DIAGNOSIS — M069 Rheumatoid arthritis, unspecified: Secondary | ICD-10-CM | POA: Diagnosis not present

## 2020-04-07 DIAGNOSIS — R300 Dysuria: Secondary | ICD-10-CM | POA: Diagnosis not present

## 2020-04-07 DIAGNOSIS — Z09 Encounter for follow-up examination after completed treatment for conditions other than malignant neoplasm: Secondary | ICD-10-CM

## 2020-04-07 DIAGNOSIS — I6930 Unspecified sequelae of cerebral infarction: Secondary | ICD-10-CM

## 2020-04-07 DIAGNOSIS — N390 Urinary tract infection, site not specified: Secondary | ICD-10-CM | POA: Diagnosis not present

## 2020-04-07 DIAGNOSIS — Z23 Encounter for immunization: Secondary | ICD-10-CM

## 2020-04-07 DIAGNOSIS — I10 Essential (primary) hypertension: Secondary | ICD-10-CM

## 2020-04-07 DIAGNOSIS — B372 Candidiasis of skin and nail: Secondary | ICD-10-CM

## 2020-04-07 DIAGNOSIS — R7303 Prediabetes: Secondary | ICD-10-CM | POA: Diagnosis not present

## 2020-04-07 LAB — URINALYSIS, ROUTINE W REFLEX MICROSCOPIC
Bilirubin Urine: NEGATIVE
Hgb urine dipstick: NEGATIVE
Nitrite: POSITIVE — AB
Specific Gravity, Urine: 1.025 (ref 1.001–1.03)
pH: 5.5 (ref 5.0–8.0)

## 2020-04-07 LAB — MICROSCOPIC MESSAGE

## 2020-04-07 MED ORDER — FLUCONAZOLE 150 MG PO TABS: 150.0000 mg | ORAL_TABLET | Freq: Once | ORAL | 0 refills | Status: AC

## 2020-04-07 MED ORDER — OXYCODONE-ACETAMINOPHEN 7.5-325 MG PO TABS: 1.0000 | ORAL_TABLET | Freq: Three times a day (TID) | ORAL | 0 refills | Status: DC | PRN

## 2020-04-07 MED ORDER — NYSTATIN 100000 UNIT/GM EX CREA: 1.0000 "application " | TOPICAL_CREAM | Freq: Two times a day (BID) | CUTANEOUS | 2 refills | Status: DC

## 2020-04-07 MED ORDER — NITROFURANTOIN MONOHYD MACRO 100 MG PO CAPS: 100.0000 mg | ORAL_CAPSULE | Freq: Two times a day (BID) | ORAL | 0 refills | Status: DC

## 2020-04-07 NOTE — Progress Notes (Signed)
Subjective:    Patient ID: [name], female    DOB: 03-20-1934, 86 y.o.   MRN: [id number]  Patient presents for Vaginal Itching (pt complains of burning and itching in her vaginal area for 1-2 weeks possibly longers. unsure of discharge)  Pt here with vaginal itching and irritation She has underlying dementia, gait instability, essential tremor and chronic pain. SHe is in wheelchair most of day per notations from neurology  She has not been to our office since March of this year when she was sent to ER for chest pain  Patient here with vaginal irritation and itching for the past few weeks. She does wear depends as urinary incontinence. She was treated prophylactically for yeast infection back in the spring she had Diflucan and Lotrisone but she did not recall this. Her son is with her today he also does not recall the treatment. She denies any vaginal bleeding.  She is also had some dysuria symptoms but no fever chills abdominal pain associated.  Chronic joint pain. She takes oxycodone acetaminophen sparingly her last prescription was back in March has lasted until now.    Review Of Systems:  GEN- denies fatigue, fever, weight loss,weakness, recent illness HEENT- denies eye drainage, change in vision, nasal discharge, CVS- denies chest pain, palpitations RESP- denies SOB, cough, wheeze ABD- denies N/V, change in stools, abd pain GU- denies dysuria, hematuria, dribbling, incontinence MSK- denies joint pain, muscle aches, injury Neuro- denies headache, dizziness, syncope, seizure activity       Objective:    BP 138/76   Pulse 94   Temp 97.8 F (36.6 C) (Temporal)   Resp 16   Ht 5\' 5"  (1.651 m)   Wt 175 lb (79.4 kg)   SpO2 95%   BMI 29.12 kg/m  GEN- NAD, alert and oriented x3 HEENT- PERRL, EOMI, non injected sclera, pink conjunctiva, MMM, oropharynx clear Neck- Supple, no thyromegaly CVS- RRR, no murmur RESP-CTAB ABD-NABS,soft,NT,ND EXT- No edema Pulses-  Radial, DP- 2+        Assessment & Plan:      Problem List Items Addressed This Visit      Unprioritized   Essential hypertension (Chronic)    Blood pressure looks good today. She is taking her medicines as prescribed. She not had any labs in quite some time.   also noted to have glucose in her nurse will check A1c her son states that she was told she is borderline diabetic in the past.      Relevant Orders   CBC with Differential/Platelet (Completed)   Comprehensive metabolic panel (Completed)   Gait abnormality   Relevant Medications   oxyCODONE-acetaminophen (PERCOCET) 7.5-325 MG tablet   Pre-diabetes   Relevant Orders   Hemoglobin A1c (Completed)   RA (rheumatoid arthritis) (HCC) (Chronic)    History of arthritis along with history of compression fractures. Pain medicine refilled      Relevant Medications   oxyCODONE-acetaminophen (PERCOCET) 7.5-325 MG tablet    Other Visit Diagnoses    Urinary tract infection without hematuria, site unspecified    -  Primary   Relevant Medications   nitrofurantoin, macrocrystal-monohydrate, (MACROBID) 100 MG capsule   nystatin cream (MYCOSTATIN)   Other Relevant Orders   Urinalysis, Routine w reflex microscopic (Completed)   Urine Culture   Candidal intertrigo       Discussed skin care in the setting of cognitive independence. Will use nystatin cream to give her Diflucan. They can use Aquaphor to skin as a barrier  Relevant Medications   nitrofurantoin, macrocrystal-monohydrate, (MACROBID) 100 MG capsule   nystatin cream (MYCOSTATIN)   Need for prophylactic vaccination and inoculation against influenza       Relevant Orders   Flu Vaccine QUAD High Dose(Fluad) (Completed)      Note: This dictation was prepared with Dragon dictation along with smaller phrase technology. Any transcriptional errors that result from this process are unintentional.

## 2020-04-07 NOTE — Patient Instructions (Addendum)
Take antibiotics as prescribed  Use nystatin cream beneath stomach and vaginal area You can also use Aqauphor to skin  We will call with lab results Flu shot  F/U Dr. Dennard Schaumann 3 months for physical

## 2020-04-08 ENCOUNTER — Encounter: Payer: Self-pay | Admitting: Family Medicine

## 2020-04-08 LAB — CBC WITH DIFFERENTIAL/PLATELET
Absolute Monocytes: 635 cells/uL (ref 200–950)
Basophils Absolute: 87 cells/uL (ref 0–200)
Basophils Relative: 1 %
Eosinophils Absolute: 157 cells/uL (ref 15–500)
Eosinophils Relative: 1.8 %
HCT: 41.9 % (ref 35.0–45.0)
Hemoglobin: 13.9 g/dL (ref 11.7–15.5)
Lymphs Abs: 3210 cells/uL (ref 850–3900)
MCH: 30.4 pg (ref 27.0–33.0)
MCHC: 33.2 g/dL (ref 32.0–36.0)
MCV: 91.7 fL (ref 80.0–100.0)
MPV: 10.6 fL (ref 7.5–12.5)
Monocytes Relative: 7.3 %
Neutro Abs: 4611 cells/uL (ref 1500–7800)
Neutrophils Relative %: 53 %
Platelets: 193 10*3/uL (ref 140–400)
RBC: 4.57 10*6/uL (ref 3.80–5.10)
RDW: 12.5 % (ref 11.0–15.0)
Total Lymphocyte: 36.9 %
WBC: 8.7 10*3/uL (ref 3.8–10.8)

## 2020-04-08 LAB — COMPREHENSIVE METABOLIC PANEL
AG Ratio: 1.6 (calc) (ref 1.0–2.5)
ALT: 12 U/L (ref 6–29)
AST: 21 U/L (ref 10–35)
Albumin: 4.2 g/dL (ref 3.6–5.1)
Alkaline phosphatase (APISO): 53 U/L (ref 37–153)
BUN/Creatinine Ratio: 16 (calc) (ref 6–22)
BUN: 17 mg/dL (ref 7–25)
CO2: 31 mmol/L (ref 20–32)
Calcium: 9.5 mg/dL (ref 8.6–10.4)
Chloride: 100 mmol/L (ref 98–110)
Creat: 1.04 mg/dL — ABNORMAL HIGH (ref 0.60–0.88)
Globulin: 2.6 g/dL (calc) (ref 1.9–3.7)
Glucose, Bld: 102 mg/dL — ABNORMAL HIGH (ref 65–99)
Potassium: 4.7 mmol/L (ref 3.5–5.3)
Sodium: 140 mmol/L (ref 135–146)
Total Bilirubin: 0.8 mg/dL (ref 0.2–1.2)
Total Protein: 6.8 g/dL (ref 6.1–8.1)

## 2020-04-08 LAB — HEMOGLOBIN A1C
Hgb A1c MFr Bld: 6.1 % of total Hgb — ABNORMAL HIGH (ref <5.7)
Mean Plasma Glucose: 128 mg/dL
eAG (mmol/L): 7.1 mmol/L

## 2020-04-08 NOTE — Assessment & Plan Note (Signed)
Blood pressure looks good today. She is taking her medicines as prescribed. She not had any labs in quite some time.   also noted to have glucose in her nurse will check A1c her son states that she was told she is borderline diabetic in the past.

## 2020-04-08 NOTE — Assessment & Plan Note (Signed)
History of arthritis along with history of compression fractures. Pain medicine refilled

## 2020-04-10 ENCOUNTER — Other Ambulatory Visit: Payer: Self-pay | Admitting: Family Medicine

## 2020-04-10 DIAGNOSIS — R269 Unspecified abnormalities of gait and mobility: Secondary | ICD-10-CM

## 2020-04-10 LAB — URINE CULTURE
MICRO NUMBER:: 11286791
SPECIMEN QUALITY:: ADEQUATE

## 2020-04-10 MED ORDER — OXYCODONE-ACETAMINOPHEN 7.5-325 MG PO TABS: 1.0000 | ORAL_TABLET | Freq: Three times a day (TID) | ORAL | 0 refills | Status: DC | PRN

## 2020-04-16 ENCOUNTER — Ambulatory Visit (INDEPENDENT_AMBULATORY_CARE_PROVIDER_SITE_OTHER): Payer: Medicare Other | Admitting: Podiatry

## 2020-04-16 ENCOUNTER — Other Ambulatory Visit: Payer: Self-pay

## 2020-04-16 ENCOUNTER — Encounter: Payer: Self-pay | Admitting: Podiatry

## 2020-04-16 DIAGNOSIS — M79676 Pain in unspecified toe(s): Secondary | ICD-10-CM

## 2020-04-16 DIAGNOSIS — I872 Venous insufficiency (chronic) (peripheral): Secondary | ICD-10-CM

## 2020-04-16 DIAGNOSIS — E1142 Type 2 diabetes mellitus with diabetic polyneuropathy: Secondary | ICD-10-CM

## 2020-04-16 DIAGNOSIS — B351 Tinea unguium: Secondary | ICD-10-CM | POA: Diagnosis not present

## 2020-04-16 NOTE — Progress Notes (Signed)
She presents today chief complaint of painful toenails bilaterally.  Objective: Pulses are palpable.  Toenails are thick yellow dystrophic with mycotic bilaterally.  Assessment: Pain in limb secondary to onychomycosis.  Plan: Debridement of 8 toenails.  1 through 5 left and then 3 on the right foot.  Follow-up with her in 3 months

## 2020-05-18 ENCOUNTER — Other Ambulatory Visit: Payer: Self-pay | Admitting: Family Medicine

## 2020-05-18 ENCOUNTER — Other Ambulatory Visit: Payer: Self-pay | Admitting: Podiatry

## 2020-05-18 ENCOUNTER — Ambulatory Visit: Payer: Medicare Other | Admitting: Family Medicine

## 2020-05-22 ENCOUNTER — Other Ambulatory Visit: Payer: Self-pay | Admitting: Podiatry

## 2020-05-22 NOTE — Telephone Encounter (Signed)
Routed to Dr. Sherryle Lis

## 2020-05-25 ENCOUNTER — Other Ambulatory Visit: Payer: Self-pay

## 2020-05-25 ENCOUNTER — Encounter: Payer: Self-pay | Admitting: Family Medicine

## 2020-05-25 ENCOUNTER — Ambulatory Visit (INDEPENDENT_AMBULATORY_CARE_PROVIDER_SITE_OTHER): Payer: Medicare Other | Admitting: Family Medicine

## 2020-05-25 ENCOUNTER — Ambulatory Visit (HOSPITAL_COMMUNITY)
Admission: RE | Admit: 2020-05-25 | Discharge: 2020-05-25 | Disposition: A | Discharge status: Home / Self Care | Payer: Medicare Other | Source: Ambulatory Visit | Attending: Family Medicine | Admitting: Family Medicine

## 2020-05-25 VITALS — BP 110/70 | HR 94 | Temp 98.7°F | Ht 64.0 in

## 2020-05-25 DIAGNOSIS — R091 Pleurisy: Secondary | ICD-10-CM | POA: Diagnosis not present

## 2020-05-25 DIAGNOSIS — R0781 Pleurodynia: Secondary | ICD-10-CM | POA: Diagnosis not present

## 2020-05-25 NOTE — Progress Notes (Signed)
Subjective:    Patient ID: [name], female    DOB: 1933-12-06, 87 y.o.   MRN: [id number]  HPI 3 weeks ago, the patient fell in her home trying to go to the bathroom.  It was in the middle of the night.  She does not remember exactly what happened or how she fell however she has had bilateral rib pain ever since.  She complains of pain in the lower lobes of both lungs posteriorly and it radiates around underneath her breast bilaterally.  She is tender to palpation over the ribs in these areas however on pulmonary exam, she also has rhonchorous breath sounds with bibasilar crackles in that area.  She also reports a cough.  She denies any shortness of breath or chest pain or fever other than the pleurisy of taking a deep breath.  She denies any hemoptysis.  She denies any purulent sputum Office Visit on 04/07/2020  Component Date Value Ref Range Status  . Color, Urine 04/07/2020 DARK YELLOW  YELLOW Final  . APPearance 04/07/2020 SLIGHTLY CLOUDY* CLEAR Final  . Specific Gravity, Urine 04/07/2020 1.025  1.001 - 1.03 Final  . pH 04/07/2020 5.5  5.0 - 8.0 Final  . Glucose, UA 04/07/2020 TRACE* NEGATIVE Final  . Bilirubin Urine 04/07/2020 NEGATIVE  NEGATIVE Final   Comment: Verified by repeat analysis. Blake Divine, ur 04/07/2020 TRACE* NEGATIVE Final  . Hgb urine dipstick 04/07/2020 NEGATIVE  NEGATIVE Final  . Protein, ur 04/07/2020 1+* NEGATIVE Final  . Nitrite 04/07/2020 POSITIVE* NEGATIVE Final  . Leukocytes,Ua 04/07/2020 2+* NEGATIVE Final  . WBC, UA 04/07/2020 CANCELED   Final   Comment: TEST NOT PERFORMED . Quantity not sufficient.  Result canceled by the ancillary.   Marland Kitchen MICRO NUMBER: 04/07/2020 [id number]   Final  . SPECIMEN QUALITY: 04/07/2020 Adequate   Final  . Sample Source 04/07/2020 URINE   Final  . STATUS: 04/07/2020 FINAL   Final  . ISOLATE 1: 04/07/2020 ESBL Escherichia coli*  Final   Greater than 100,000 CFU/mL of Escherichia coli (ESBL)  . WBC 04/07/2020 8.7  3.8  - 10.8 Thousand/uL Final  . RBC 04/07/2020 4.57  3.80 - 5.10 Million/uL Final  . Hemoglobin 04/07/2020 13.9  11.7 - 15.5 g/dL Final  . HCT 27/25/3664 41.9  35.0 - 45.0 % Final  . MCV 04/07/2020 91.7  80.0 - 100.0 fL Final  . MCH 04/07/2020 30.4  27.0 - 33.0 pg Final  . MCHC 04/07/2020 33.2  32.0 - 36.0 g/dL Final  . RDW 40/34/7425 12.5  11.0 - 15.0 % Final  . Platelets 04/07/2020 193  140 - 400 Thousand/uL Final  . MPV 04/07/2020 10.6  7.5 - 12.5 fL Final  . Neutro Abs 04/07/2020 4,611  1,500 - 7,800 cells/uL Final  . Lymphs Abs 04/07/2020 3,210  850 - 3,900 cells/uL Final  . Absolute Monocytes 04/07/2020 635  200 - 950 cells/uL Final  . Eosinophils Absolute 04/07/2020 157  15 - 500 cells/uL Final  . Basophils Absolute 04/07/2020 87  0 - 200 cells/uL Final  . Neutrophils Relative % 04/07/2020 53  % Final  . Total Lymphocyte 04/07/2020 36.9  % Final  . Monocytes Relative 04/07/2020 7.3  % Final  . Eosinophils Relative 04/07/2020 1.8  % Final  . Basophils Relative 04/07/2020 1.0  % Final  . Glucose, Bld 04/07/2020 102* 65 - 99 mg/dL Final   Comment: .            Fasting reference interval .  For someone without known diabetes, a glucose value between 100 and 125 mg/dL is consistent with prediabetes and should be confirmed with a follow-up test. .   . BUN 04/07/2020 17  7 - 25 mg/dL Final  . Creat 16/01/9603 1.04* 0.60 - 0.88 mg/dL Final   Comment: For patients >18 years of age, the reference limit for Creatinine is approximately 13% higher for people identified as African-American. .   Edwena Felty Ratio 04/07/2020 16  6 - 22 (calc) Final  . Sodium 04/07/2020 140  135 - 146 mmol/L Final  . Potassium 04/07/2020 4.7  3.5 - 5.3 mmol/L Final  . Chloride 04/07/2020 100  98 - 110 mmol/L Final  . CO2 04/07/2020 31  20 - 32 mmol/L Final  . Calcium 04/07/2020 9.5  8.6 - 10.4 mg/dL Final  . Total Protein 04/07/2020 6.8  6.1 - 8.1 g/dL Final  . Albumin 54/12/8117 4.2  3.6 - 5.1 g/dL  Final  . Globulin 14/78/2956 2.6  1.9 - 3.7 g/dL (calc) Final  . AG Ratio 04/07/2020 1.6  1.0 - 2.5 (calc) Final  . Total Bilirubin 04/07/2020 0.8  0.2 - 1.2 mg/dL Final  . Alkaline phosphatase (APISO) 04/07/2020 53  37 - 153 U/L Final  . AST 04/07/2020 21  10 - 35 U/L Final  . ALT 04/07/2020 12  6 - 29 U/L Final  . Hgb A1c MFr Bld 04/07/2020 6.1* <5.7 % of total Hgb Final   Comment: For someone without known diabetes, a hemoglobin  A1c value between 5.7% and 6.4% is consistent with prediabetes and should be confirmed with a  follow-up test. . For someone with known diabetes, a value <7% indicates that their diabetes is well controlled. A1c targets should be individualized based on duration of diabetes, age, comorbid conditions, and other considerations. . This assay result is consistent with an increased risk of diabetes. . Currently, no consensus exists regarding use of hemoglobin A1c for diagnosis of diabetes for children. .   . Mean Plasma Glucose 04/07/2020 128  mg/dL Final  . eAG (mmol/L) 21/30/8657 7.1  mmol/L Final  . Note 04/07/2020    Final   Comment: This urine was analyzed for the presence of WBC,  RBC, bacteria, casts, and other formed elements.  Only those elements seen were reported. . .     Past Medical History:  Diagnosis Date  . [name] (abdominal aortic aneurysm) (HCC) 09/2013   3.6 cm  . Acute and chronic respiratory failure with hypoxia (HCC)   . Acute delirium   . Acute encephalopathy   . Acute pyelonephritis   . Atrial fibrillation (HCC)   . Chronic pain syndrome   . Constipation   . Dementia (HCC)   . Dependent edema    bilateral legs   . Depression   . Diabetes mellitus    pre-diabetes  . Diverticulosis   . Diverticulosis   . Dysuria   . E coli bacteremia   . Fall   . Gross hematuria   . Hydronephrosis with renal and ureteral calculous obstruction   . Hyperlipidemia   . Hypertension   . Hypoxemia   . Idiopathic peripheral autonomic  neuropathy   . Infestation by bed bug   . Nephrolithiasis   . OSA on CPAP   . Osteoarthritis   . Osteopenia    T=  -2.1 in hip  . Persistent mood (affective) disorder, unspecified (HCC)   . Prediabetes   . Rheumatoid arthritis (HCC)   . Rheumatoid arthritis(714.0)   .  S/P carpal tunnel release   . Sepsis secondary to UTI (HCC)   . Spinal stenosis   . Unspecified fracture of fifth lumbar vertebra, initial encounter for closed fracture (HCC)   . Unspecified fracture of t11-T12 vertebra, initial encounter for closed fracture (HCC)   . Urinary incontinence   . Urinary tract infection    hx of   . Urolithiasis    Past Surgical History:  Procedure Laterality Date  . APPENDECTOMY    . CARPAL TUNNEL RELEASE  12/27/2006   Right subcutaneous ulnar nerve transfer -- Right open carpal tunnel release  . CHOLECYSTECTOMY    . CYSTOSCOPY W/ URETERAL STENT PLACEMENT Right 01/30/2018   Procedure: CYSTOSCOPY WITH STENT REPLACEMENT retrograde pylegram;  Surgeon: Crist Fat, MD;  Location: [facility];  Service: Urology;  Laterality: Right;  . CYSTOSCOPY WITH URETEROSCOPY AND STENT PLACEMENT Right 03/01/2018   Procedure: CYSTOSCPY/RIGHT URETEROSCOPY HOLMIUM LASER LITHOTRIPSY AND STENT EXCHANGE;  Surgeon: Crist Fat, MD;  Location: [facility];  Service: Urology;  Laterality: Right;  . FOOT SURGERY    . HOLMIUM LASER APPLICATION Right 03/01/2018   Procedure: HOLMIUM LASER APPLICATION;  Surgeon: Crist Fat, MD;  Location: [facility];  Service: Urology;  Laterality: Right;  . JOINT REPLACEMENT     BTKR, RTHR  . TOTAL KNEE ARTHROPLASTY  08/21/2007   Right total knee replacement   Current Outpatient Medications on File Prior to Visit  Medication Sig Dispense Refill  . acetaminophen (TYLENOL) 500 MG tablet Take 500 mg by mouth every 8 (eight) hours as needed for mild pain.     Marland Kitchen ammonium lactate (AMLACTIN) 12 % cream Apply topically as needed for dry skin. 385 g 0  . aspirin EC 81 MG  tablet Take 81 mg by mouth daily.    Marland Kitchen atenolol (TENORMIN) 50 MG tablet Take 1 tablet (50 mg total) by mouth daily. 30 tablet 3  . Calcium Carbonate-Vitamin D3 600-400 MG-UNIT TABS Take 1 tablet by mouth daily.    . Cholecalciferol 50 MCG (2000 UT) TABS Take by mouth.    . diclofenac sodium (VOLTAREN) 1 % GEL Apply 2 g topically 4 (four) times daily. Rub into affected area of foot 2 to 4 times daily 100 g 2  . DULoxetine (CYMBALTA) 30 MG capsule Take 1 capsule (30 mg total) by mouth daily. Takes along with 60mg  for total daily dose of 90mg . 20 capsule 2  . DULoxetine (CYMBALTA) 60 MG capsule Take 1 capsule (60 mg total) by mouth daily. Take along with 30mg  for a total daily dose of 90mg . 20 capsule 2  . gabapentin (NEURONTIN) 800 MG tablet Take 1 tablet (800 mg total) by mouth 3 (three) times daily. 270 tablet 4  . ketoconazole (NIZORAL) 2 % cream APPLY 1 APPLICATION TOPICALLY DAILY 60 g 2  . mupirocin ointment (BACTROBAN) 2 % APPLY TO WOUND TWICE DAILY 30 g 2  . nitrofurantoin, macrocrystal-monohydrate, (MACROBID) 100 MG capsule Take 1 capsule (100 mg total) by mouth 2 (two) times daily. 14 capsule 0  . nystatin cream (MYCOSTATIN) Apply 1 application topically 2 (two) times daily. 30 g 2  . oxyCODONE-acetaminophen (PERCOCET) 7.5-325 MG tablet Take 1 tablet by mouth every 8 (eight) hours as needed for severe pain. 90 tablet 0  . pantoprazole (PROTONIX) 40 MG tablet TAKE 1 TABLET BY MOUTH ONCE DAILY 90 tablet 3  . rosuvastatin (CRESTOR) 40 MG tablet Take 1 tablet (40 mg total) by mouth every evening. 30 tablet 3   No  current facility-administered medications on file prior to visit.   Allergies  Allergen Reactions  . Sulfonamide Derivatives Hives and Rash   Social History   Socioeconomic History  . Marital status: Divorced    Spouse name: Not on file  . Number of children: 6  . Years of education: 8th grade  . Highest education level: Not on file  Occupational History  . Occupation:  Retired  Tobacco Use  . Smoking status: Never Smoker  . Smokeless tobacco: Never Used  Vaping Use  . Vaping Use: Never used  Substance and Sexual Activity  . Alcohol use: No  . Drug use: No  . Sexual activity: Not on file  Other Topics Concern  . Not on file  Social History Narrative   Lives at home with 2 sons   She has four living children.   Right-handed.   Two cups caffeine per day.   Social Determinants of Health   Financial Resource Strain: Not on file  Food Insecurity: Not on file  Transportation Needs: Not on file  Physical Activity: Not on file  Stress: Not on file  Social Connections: Not on file  Intimate Partner Violence: Not on file     Review of Systems  All other systems reviewed and are negative.      Objective:   Physical Exam Constitutional:      General: She is not in acute distress.    Appearance: She is normal weight. She is not ill-appearing, toxic-appearing or diaphoretic.  Cardiovascular:     Rate and Rhythm: Normal rate. Rhythm irregular.     Pulses: Normal pulses.     Heart sounds: Normal heart sounds. No murmur heard. No friction rub. No gallop.   Pulmonary:     Effort: Pulmonary effort is normal.     Breath sounds: Normal air entry. No stridor, decreased air movement or transmitted upper airway sounds. Rales present.    Chest:     Chest wall: No tenderness or edema.  Musculoskeletal:     Right lower leg: No edema.     Left lower leg: No edema.  Neurological:     Mental Status: She is alert.           Assessment & Plan:  Pleurisy - Plan: DG Chest 2 View  Patient may have fractured her rib or ribs however I am concerned about the abnormal breath sounds on her exam.  I am concerned about atelectasis versus early pneumonia.  Therefore I recommended that she go get a chest x-ray.  If there is only atelectasis, I would encourage incentive spirometry and pain medication to help encourage deep breathing.  Would also encourage a  rib belt.  However if there is early pneumonia, I would start antibiotics to cover community-acquired pneumonia.  She will go get the x-rays tonight

## 2020-06-02 IMAGING — CT CT RENAL STONE PROTOCOL
2 of 4 series · 16 of 46 positions shown, 18 images · non-contrast
Comparison: 09/04/2017

CLINICAL DATA: Acute abdominal flank pain worse on the right

EXAM:
CT ABDOMEN AND PELVIS WITHOUT CONTRAST
TECHNIQUE: Multidetector CT imaging of the abdomen and pelvis was performed
following the standard protocol without IV contrast.

[Series 2: axial st · axial · 0.98mm/px · z∈[+492,+867]mm · 13 of 83 slices shown, 15 images]
[im 4/83  soft-tissue]
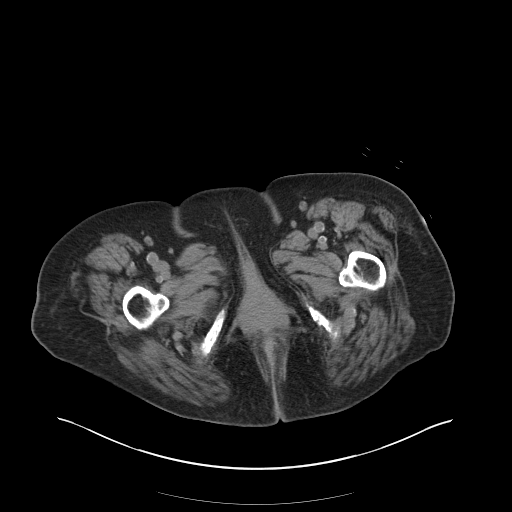
[im 4/83  bone]
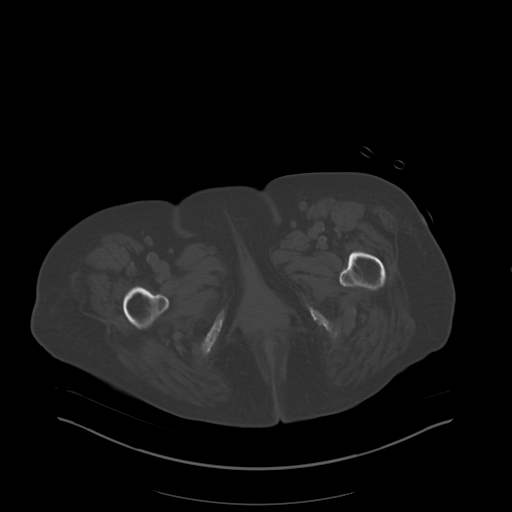
[im 12/83  soft-tissue]
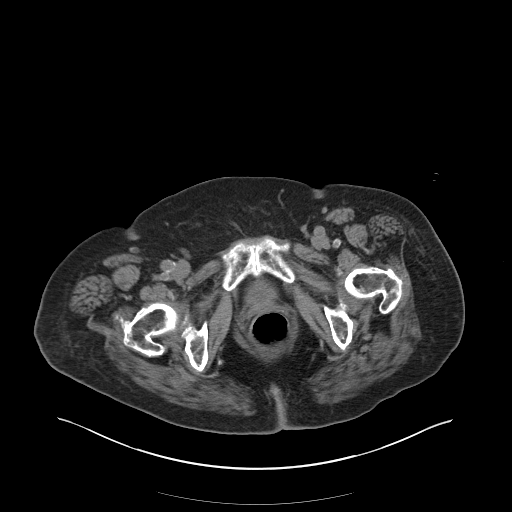
[im 16/83  soft-tissue]
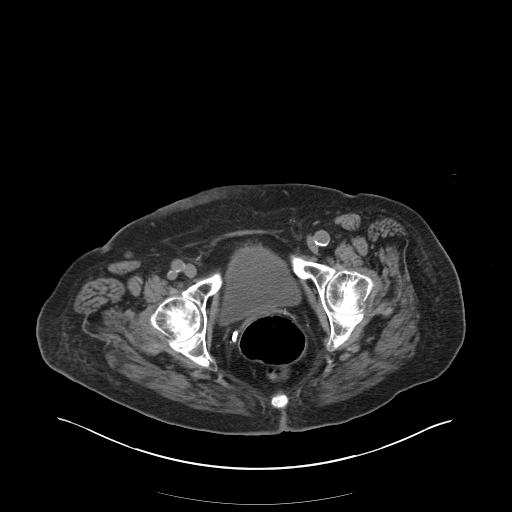
[im 24/83  soft-tissue]
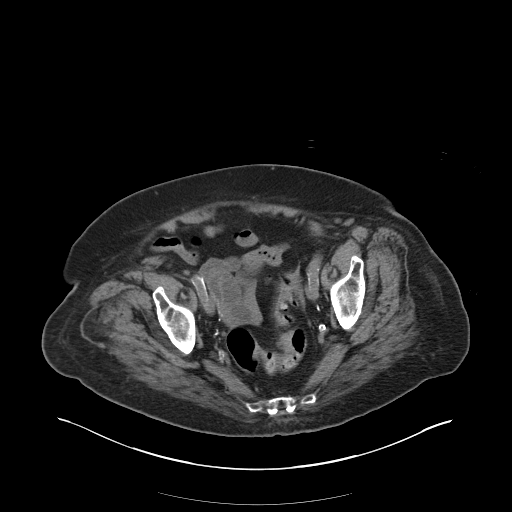
[im 28/83  soft-tissue]
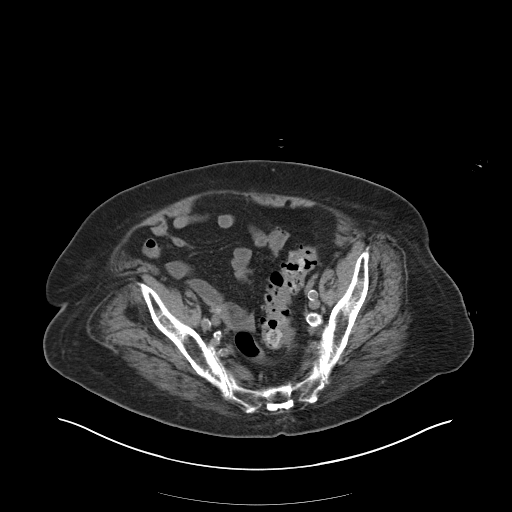
[im 36/83  soft-tissue]
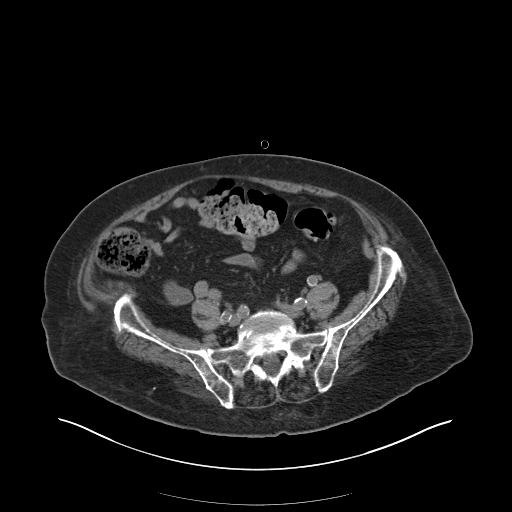
[im 43/83  soft-tissue]
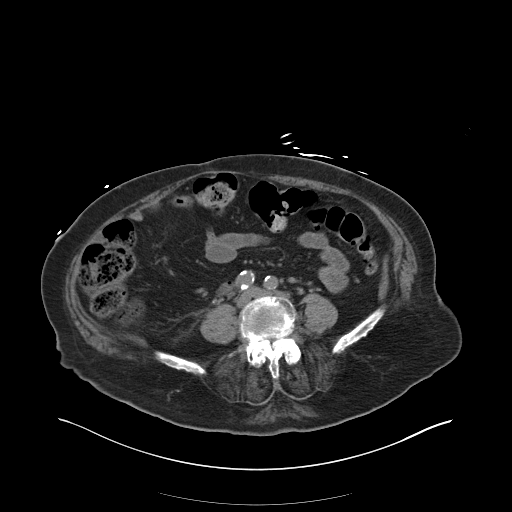
[im 47/83  soft-tissue]
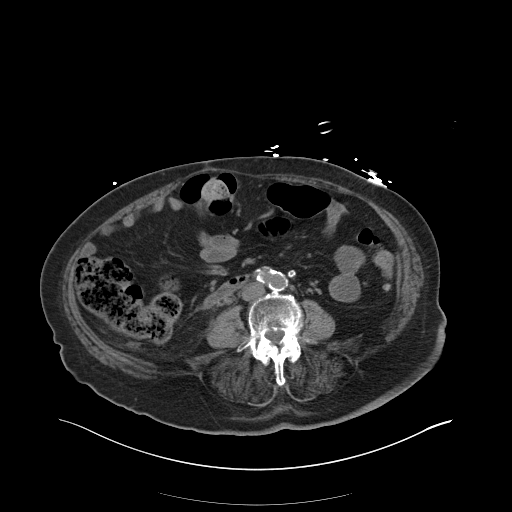
[im 55/83  soft-tissue]
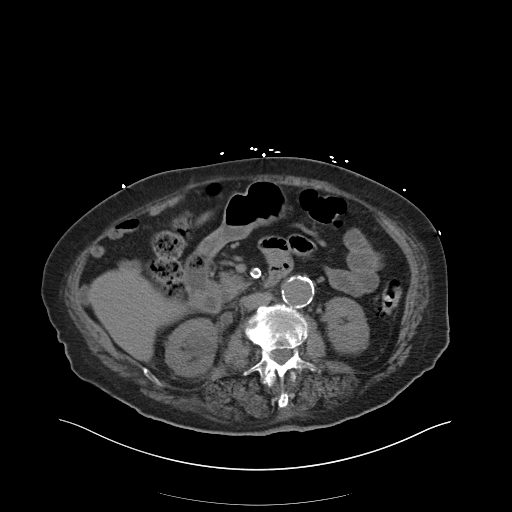
[im 55/83  bone]
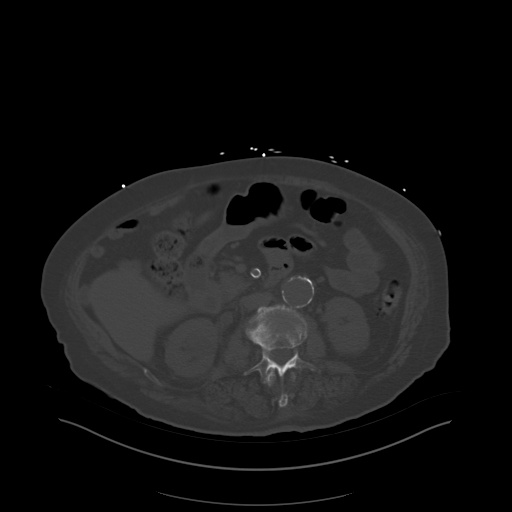
[im 59/83  soft-tissue]
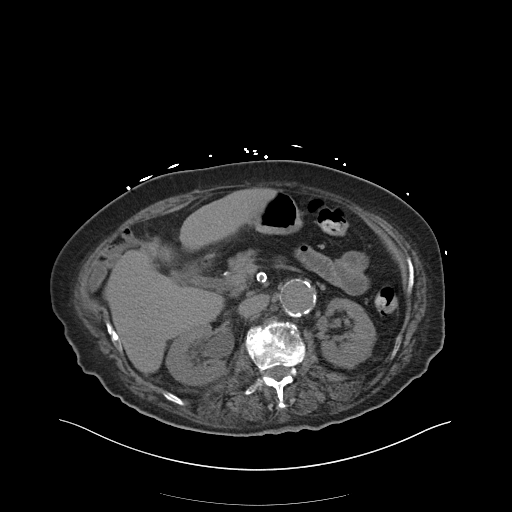
[im 67/83  soft-tissue]
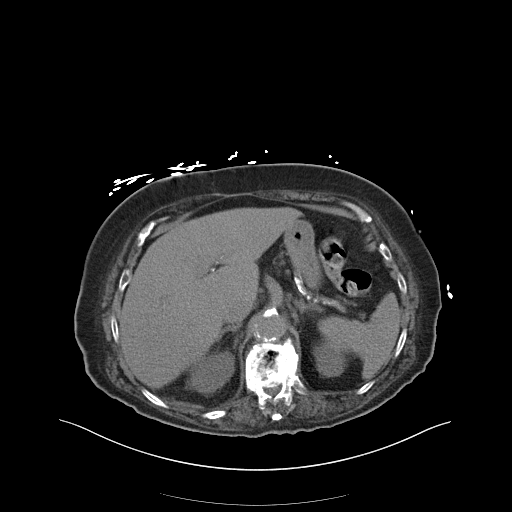
[im 71/83  soft-tissue]
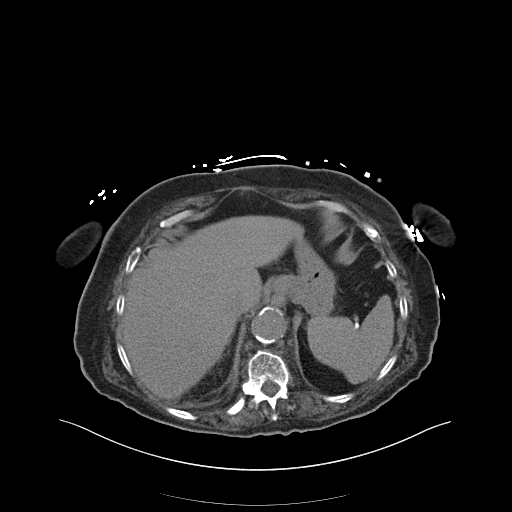
[im 79/83  soft-tissue]
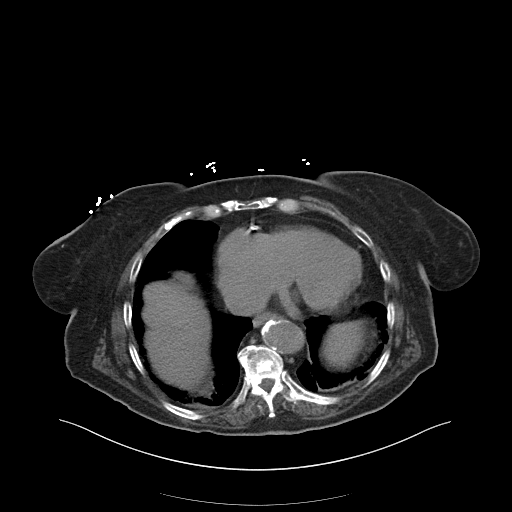

[Series 4: coronal · coronal · 0.82mm/px · 3 of 161 slices shown]
[im 54/161  soft-tissue]
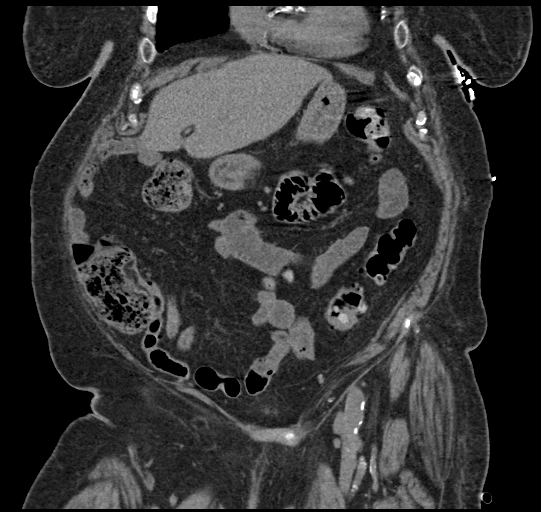
[im 72/161  soft-tissue]
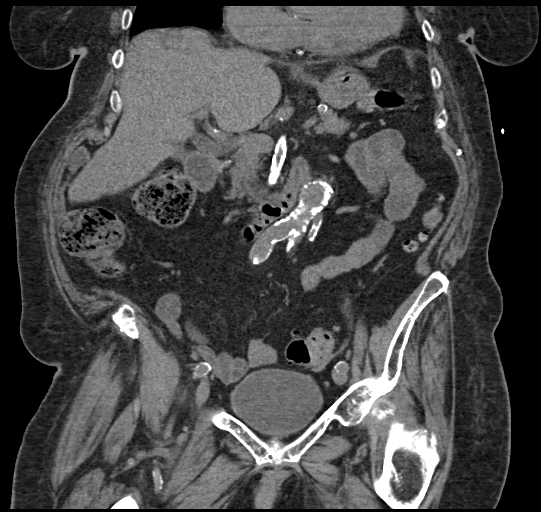
[im 89/161  soft-tissue]
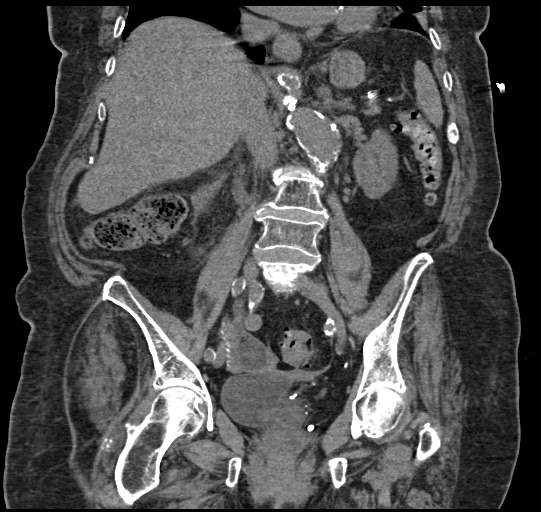

[16 of 46 positions shown; findings below may reference images not displayed]

FINDINGS: Lower chest: Bibasilar atelectasis. Mild cardiomegaly. No
pericardial or pleural effusion. Small hiatal hernia. Descending
thoracic aorta is tortuous and ectatic with atherosclerosis.

Hepatobiliary: No focal liver abnormality is seen. Status post
cholecystectomy. No biliary dilatation.

Pancreas: Unremarkable. No pancreatic ductal dilatation or
surrounding inflammatory changes.

Spleen: Normal in size without focal abnormality.

Adrenals/Urinary Tract: Normal adrenal glands.

Right kidney demonstrates acute perinephric strandy
edema/inflammation. There is associated moderate right
hydroureteronephrosis secondary to a proximal obstructing right
ureteral calculus measuring 6 mm, image 34 series 2. There is a
stable 2.5 cm right kidney lower pole hypodense cyst medially.

Left kidney and ureter demonstrate no acute process or obstruction.
Left ureter is nondilated.

Air noted within the bladder dome.  Bladder otherwise unremarkable.

Stomach/Bowel: Negative for bowel obstruction, significant
dilatation, ileus, or free air. Appendix not visualized.

No fluid collection or abscess. Moderate colonic stool burden.
Extensive sigmoid diverticulosis without acute inflammatory process.

Vascular/Lymphatic: Atherosclerosis of the aorta. Infrarenal aorta
has aneurysm dilatation measuring 3.9 cm. Extensive iliac
atherosclerosis as well. No retroperitoneal hemorrhage or hematoma.
No adenopathy.

Reproductive: Remote hysterectomy. No other pelvic abnormality,
fluid collection, ascites, or abscess.

Other: No abdominal wall hernia or abnormality. No abdominopelvic
ascites.

Musculoskeletal: Bones are osteopenic. Degenerative changes of the
spine. Extensive facet arthropathy. T12 and L5 subacute partially
healing compression fractures noted when compared to 07/03/2017.
IMPRESSION: Right proximal ureteral 6 mm obstructing calculus with associated
right hydroureteronephrosis and perinephric/ureteral inflammation
and edema.

Extensive abdominopelvic atherosclerosis with an infrarenal aneurysm
measuring up to 3.9 cm proximally.

Remote cholecystectomy, appendectomy and hysterectomy

Colonic diverticulosis without acute inflammation or complicating
feature

Subacute T12 and L5 compression fractures, new since 07/03/2017

## 2020-07-07 ENCOUNTER — Other Ambulatory Visit: Payer: Self-pay

## 2020-07-07 ENCOUNTER — Ambulatory Visit (INDEPENDENT_AMBULATORY_CARE_PROVIDER_SITE_OTHER): Payer: Medicare Other | Admitting: Family Medicine

## 2020-07-07 ENCOUNTER — Encounter: Payer: Self-pay | Admitting: Family Medicine

## 2020-07-07 DIAGNOSIS — H9202 Otalgia, left ear: Secondary | ICD-10-CM | POA: Diagnosis not present

## 2020-07-07 MED ORDER — OXYCODONE-ACETAMINOPHEN 7.5-325 MG PO TABS: 1.0000 | ORAL_TABLET | Freq: Three times a day (TID) | ORAL | 0 refills | Status: DC | PRN

## 2020-07-07 MED ORDER — DEBROX 6.5 % OT SOLN: 5.0000 [drp] | Freq: Two times a day (BID) | OTIC | 0 refills | Status: DC

## 2020-07-07 NOTE — Progress Notes (Signed)
Subjective:    Patient ID: [name], female    DOB: 03-09-1934, 87 y.o.   MRN: [id number]   4 days ago, the patient was having severe pain in her left ear.  Her son made her the appointment.  She presents today but the left ear feels better now.  She denies any sinus pain.  She denies any sinus pressure.  She denies any sore throat or postnasal drip or cough or fever or chills.  On inspection, both ear canals are occluded with hard dry cerumen.  I tried to remove the cerumen with a curette and cause the patient significant pain in her left ear.  However I was able to visualize approximately 50% of the tympanic membrane which appeared pearly gray and healthy.  There was no erythema or blood or pus.  I believe the majority of the pain could be due to cerumen impaction Past Medical History:  Diagnosis Date  . [name] (abdominal aortic aneurysm) ([place]) 09/2013   3.6 cm  . Acute and chronic respiratory failure with hypoxia ([place])   . Acute delirium   . Acute encephalopathy   . Acute pyelonephritis   . Atrial fibrillation ([place])   . Chronic pain syndrome   . Constipation   . Dementia ([place])   . Dependent edema    bilateral legs   . Depression   . Diabetes mellitus    pre-diabetes  . Diverticulosis   . Diverticulosis   . Dysuria   . E coli bacteremia   . Fall   . Gross hematuria   . Hydronephrosis with renal and ureteral calculous obstruction   . Hyperlipidemia   . Hypertension   . Hypoxemia   . Idiopathic peripheral autonomic neuropathy   . Infestation by bed bug   . Nephrolithiasis   . OSA on CPAP   . Osteoarthritis   . Osteopenia    T=  -2.1 in hip  . Persistent mood (affective) disorder, unspecified ([place])   . Prediabetes   . Rheumatoid arthritis ([place])   . Rheumatoid arthritis(714.0)   . S/P carpal tunnel release   . Sepsis secondary to UTI ([place])   . Spinal stenosis   . Unspecified fracture of fifth lumbar vertebra, initial encounter for closed fracture ([place])   .  Unspecified fracture of t11-T12 vertebra, initial encounter for closed fracture ([place])   . Urinary incontinence   . Urinary tract infection    hx of   . Urolithiasis    Past Surgical History:  Procedure Laterality Date  . APPENDECTOMY    . CARPAL TUNNEL RELEASE  12/27/2006   Right subcutaneous ulnar nerve transfer -- Right open carpal tunnel release  . CHOLECYSTECTOMY    . CYSTOSCOPY W/ URETERAL STENT PLACEMENT Right 01/30/2018   Procedure: CYSTOSCOPY WITH STENT REPLACEMENT retrograde pylegram;  Surgeon: Ardis Hughs, MD;  Location: [facility];  Service: Urology;  Laterality: Right;  . CYSTOSCOPY WITH URETEROSCOPY AND STENT PLACEMENT Right 03/01/2018   Procedure: CYSTOSCPY/RIGHT URETEROSCOPY HOLMIUM LASER LITHOTRIPSY AND STENT EXCHANGE;  Surgeon: Ardis Hughs, MD;  Location: [facility];  Service: Urology;  Laterality: Right;  . FOOT SURGERY    . HOLMIUM LASER APPLICATION Right 72/53/6644   Procedure: HOLMIUM LASER APPLICATION;  Surgeon: Ardis Hughs, MD;  Location: [facility];  Service: Urology;  Laterality: Right;  . JOINT REPLACEMENT     BTKR, RTHR  . TOTAL KNEE ARTHROPLASTY  08/21/2007   Right total knee replacement   Current Outpatient Medications on File  Prior to Visit  Medication Sig Dispense Refill  . acetaminophen (TYLENOL) 500 MG tablet Take 500 mg by mouth every 8 (eight) hours as needed for mild pain.     Marland Kitchen ammonium lactate (AMLACTIN) 12 % cream Apply topically as needed for dry skin. 385 g 0  . aspirin EC 81 MG tablet Take 81 mg by mouth daily.    Marland Kitchen atenolol (TENORMIN) 50 MG tablet Take 1 tablet (50 mg total) by mouth daily. 30 tablet 3  . Calcium Carbonate-Vitamin D3 600-400 MG-UNIT TABS Take 1 tablet by mouth daily.    . Cholecalciferol 50 MCG (2000 UT) TABS Take by mouth.    . diclofenac sodium (VOLTAREN) 1 % GEL Apply 2 g topically 4 (four) times daily. Rub into affected area of foot 2 to 4 times daily 100 g 2  . DULoxetine (CYMBALTA) 30 MG capsule Take 1  capsule (30 mg total) by mouth daily. Takes along with 60mg  for total daily dose of 90mg . 20 capsule 2  . DULoxetine (CYMBALTA) 60 MG capsule Take 1 capsule (60 mg total) by mouth daily. Take along with 30mg  for a total daily dose of 90mg . 20 capsule 2  . gabapentin (NEURONTIN) 800 MG tablet Take 1 tablet (800 mg total) by mouth 3 (three) times daily. 270 tablet 4  . ketoconazole (NIZORAL) 2 % cream APPLY 1 APPLICATION TOPICALLY DAILY 60 g 2  . mupirocin ointment (BACTROBAN) 2 % APPLY TO WOUND TWICE DAILY 30 g 2  . nystatin cream (MYCOSTATIN) Apply 1 application topically 2 (two) times daily. 30 g 2  . oxyCODONE-acetaminophen (PERCOCET) 7.5-325 MG tablet Take 1 tablet by mouth every 8 (eight) hours as needed for severe pain. 90 tablet 0  . pantoprazole (PROTONIX) 40 MG tablet TAKE 1 TABLET BY MOUTH ONCE DAILY 90 tablet 3  . rosuvastatin (CRESTOR) 40 MG tablet Take 1 tablet (40 mg total) by mouth every evening. 30 tablet 3   No current facility-administered medications on file prior to visit.   Allergies  Allergen Reactions  . Sulfonamide Derivatives Hives and Rash   Social History   Socioeconomic History  . Marital status: Divorced    Spouse name: Not on file  . Number of children: 6  . Years of education: 8th grade  . Highest education level: Not on file  Occupational History  . Occupation: Retired  Tobacco Use  . Smoking status: Never Smoker  . Smokeless tobacco: Never Used  Vaping Use  . Vaping Use: Never used  Substance and Sexual Activity  . Alcohol use: No  . Drug use: No  . Sexual activity: Not on file  Other Topics Concern  . Not on file  Social History Narrative   Lives at home with 2 sons   She has four living children.   Right-handed.   Two cups caffeine per day.   Social Determinants of Health   Financial Resource Strain: Not on file  Food Insecurity: Not on file  Transportation Needs: Not on file  Physical Activity: Not on file  Stress: Not on file   Social Connections: Not on file  Intimate Partner Violence: Not on file     Review of Systems  All other systems reviewed and are negative.      Objective:   Physical Exam Constitutional:      General: She is not in acute distress.    Appearance: She is normal weight. She is not ill-appearing, toxic-appearing or diaphoretic.  Cardiovascular:     Rate  and Rhythm: Normal rate. Rhythm irregular.     Pulses: Normal pulses.     Heart sounds: Normal heart sounds. No murmur heard. No friction rub. No gallop.   Pulmonary:     Effort: Pulmonary effort is normal.     Breath sounds: Normal air entry. No stridor, decreased air movement or transmitted upper airway sounds. No rales.  Chest:     Chest wall: No tenderness or edema.  Musculoskeletal:     Right lower leg: No edema.     Left lower leg: No edema.  Neurological:     Mental Status: She is alert.           Assessment & Plan:  Left ear pain - Plan: oxyCODONE-acetaminophen (PERCOCET) 7.5-325 MG tablet  I was able to visualize approximately 50% of the left tympanic membrane which appeared normal.  I believe the majority of her pain is due to hard dry wax in the left auditory canal/cerumen impaction.  Patient declines to allow [place] to irrigate and lavage the ear canal to try to remove the wax.  Instead she would like to try Debrox, 5 drops twice daily for 5 days to see if she can remove the wax gradually with eardrops.  She does request a refill on her pain medication which I will give her today 90 tablets.

## 2020-07-17 ENCOUNTER — Ambulatory Visit: Payer: Medicare Other | Admitting: Podiatry

## 2020-07-21 ENCOUNTER — Other Ambulatory Visit: Payer: Self-pay | Admitting: Family Medicine

## 2020-07-21 ENCOUNTER — Other Ambulatory Visit: Payer: Self-pay | Admitting: Neurology

## 2020-07-30 ENCOUNTER — Other Ambulatory Visit: Payer: Self-pay | Admitting: *Deleted

## 2020-07-30 ENCOUNTER — Other Ambulatory Visit: Payer: Self-pay | Admitting: Neurology

## 2020-08-17 ENCOUNTER — Telehealth: Payer: Self-pay | Admitting: Neurology

## 2020-08-17 ENCOUNTER — Telehealth: Payer: Self-pay | Admitting: Cardiology

## 2020-08-17 MED ORDER — GABAPENTIN 800 MG PO TABS
800.0000 mg | ORAL_TABLET | Freq: Three times a day (TID) | ORAL | 0 refills | Status: DC
Start: 2020-08-17 — End: 2021-01-13

## 2020-08-17 NOTE — Telephone Encounter (Signed)
4.18.22 LM on home phone to sch 3 yr fu w/dr Stanford Breed.[name]

## 2020-08-17 NOTE — Addendum Note (Signed)
Addended by: Noberto Retort C on: 08/17/2020 02:03 PM   Modules accepted: Orders

## 2020-08-17 NOTE — Telephone Encounter (Signed)
I returned the call to her son. He is aware that a 90-day supply has been sent to the pharmacy to avoid disruption to her treatment. Per her last note, she may return to her PCP for follow up. Her PCP will need to manage future fills. Her son will go ahead and reach out to the other office so that a plan is in place before she is due again. He appreciated the call.

## 2020-08-17 NOTE — Telephone Encounter (Signed)
Can Gabapentin RX be sent to   [facility], [place] Phone:  [phone]  Fax:  [phone]

## 2020-08-20 ENCOUNTER — Other Ambulatory Visit: Payer: Self-pay | Admitting: Family Medicine

## 2020-08-20 DIAGNOSIS — I6381 Other cerebral infarction due to occlusion or stenosis of small artery: Secondary | ICD-10-CM

## 2020-08-21 ENCOUNTER — Other Ambulatory Visit: Payer: Self-pay | Admitting: Podiatry

## 2020-08-21 NOTE — Telephone Encounter (Signed)
Please advise 

## 2020-08-25 ENCOUNTER — Emergency Department (HOSPITAL_COMMUNITY): Payer: Medicare Other

## 2020-08-25 ENCOUNTER — Inpatient Hospital Stay (HOSPITAL_COMMUNITY)
Admission: EM | Admit: 2020-08-25 | Discharge: 2020-09-01 | DRG: 521 | Disposition: A | Discharge status: Skilled Nursing Facility | Payer: Medicare Other | Attending: Family Medicine | Admitting: Family Medicine

## 2020-08-25 ENCOUNTER — Encounter (HOSPITAL_COMMUNITY): Payer: Self-pay | Admitting: *Deleted

## 2020-08-25 DIAGNOSIS — J9601 Acute respiratory failure with hypoxia: Secondary | ICD-10-CM | POA: Diagnosis present

## 2020-08-25 DIAGNOSIS — Z96651 Presence of right artificial knee joint: Secondary | ICD-10-CM | POA: Diagnosis present

## 2020-08-25 DIAGNOSIS — R0600 Dyspnea, unspecified: Secondary | ICD-10-CM | POA: Diagnosis not present

## 2020-08-25 DIAGNOSIS — G894 Chronic pain syndrome: Secondary | ICD-10-CM | POA: Diagnosis not present

## 2020-08-25 DIAGNOSIS — Y92009 Unspecified place in unspecified non-institutional (private) residence as the place of occurrence of the external cause: Secondary | ICD-10-CM

## 2020-08-25 DIAGNOSIS — R296 Repeated falls: Secondary | ICD-10-CM | POA: Diagnosis present

## 2020-08-25 DIAGNOSIS — Z7189 Other specified counseling: Secondary | ICD-10-CM | POA: Diagnosis not present

## 2020-08-25 DIAGNOSIS — Z9049 Acquired absence of other specified parts of digestive tract: Secondary | ICD-10-CM | POA: Diagnosis not present

## 2020-08-25 DIAGNOSIS — R7989 Other specified abnormal findings of blood chemistry: Secondary | ICD-10-CM | POA: Diagnosis not present

## 2020-08-25 DIAGNOSIS — Z66 Do not resuscitate: Secondary | ICD-10-CM | POA: Diagnosis present

## 2020-08-25 DIAGNOSIS — S72002A Fracture of unspecified part of neck of left femur, initial encounter for closed fracture: Secondary | ICD-10-CM | POA: Diagnosis present

## 2020-08-25 DIAGNOSIS — F0390 Unspecified dementia without behavioral disturbance: Secondary | ICD-10-CM | POA: Diagnosis present

## 2020-08-25 DIAGNOSIS — I2510 Atherosclerotic heart disease of native coronary artery without angina pectoris: Secondary | ICD-10-CM | POA: Diagnosis not present

## 2020-08-25 DIAGNOSIS — D62 Acute posthemorrhagic anemia: Secondary | ICD-10-CM | POA: Diagnosis not present

## 2020-08-25 DIAGNOSIS — M549 Dorsalgia, unspecified: Secondary | ICD-10-CM | POA: Diagnosis not present

## 2020-08-25 DIAGNOSIS — Z20822 Contact with and (suspected) exposure to covid-19: Secondary | ICD-10-CM | POA: Diagnosis not present

## 2020-08-25 DIAGNOSIS — I5032 Chronic diastolic (congestive) heart failure: Secondary | ICD-10-CM | POA: Diagnosis not present

## 2020-08-25 DIAGNOSIS — I482 Chronic atrial fibrillation, unspecified: Secondary | ICD-10-CM | POA: Diagnosis not present

## 2020-08-25 DIAGNOSIS — S0990XA Unspecified injury of head, initial encounter: Secondary | ICD-10-CM | POA: Diagnosis not present

## 2020-08-25 DIAGNOSIS — K219 Gastro-esophageal reflux disease without esophagitis: Secondary | ICD-10-CM | POA: Diagnosis not present

## 2020-08-25 DIAGNOSIS — I110 Hypertensive heart disease with heart failure: Secondary | ICD-10-CM | POA: Diagnosis present

## 2020-08-25 DIAGNOSIS — I10 Essential (primary) hypertension: Secondary | ICD-10-CM | POA: Diagnosis present

## 2020-08-25 DIAGNOSIS — G4733 Obstructive sleep apnea (adult) (pediatric): Secondary | ICD-10-CM | POA: Diagnosis not present

## 2020-08-25 DIAGNOSIS — M069 Rheumatoid arthritis, unspecified: Secondary | ICD-10-CM | POA: Diagnosis present

## 2020-08-25 DIAGNOSIS — R000 Tachycardia, unspecified: Secondary | ICD-10-CM | POA: Diagnosis not present

## 2020-08-25 DIAGNOSIS — W19XXXA Unspecified fall, initial encounter: Secondary | ICD-10-CM

## 2020-08-25 DIAGNOSIS — Z515 Encounter for palliative care: Secondary | ICD-10-CM | POA: Diagnosis not present

## 2020-08-25 DIAGNOSIS — E785 Hyperlipidemia, unspecified: Secondary | ICD-10-CM | POA: Diagnosis present

## 2020-08-25 DIAGNOSIS — Z833 Family history of diabetes mellitus: Secondary | ICD-10-CM | POA: Diagnosis not present

## 2020-08-25 DIAGNOSIS — Z8673 Personal history of transient ischemic attack (TIA), and cerebral infarction without residual deficits: Secondary | ICD-10-CM

## 2020-08-25 DIAGNOSIS — I517 Cardiomegaly: Secondary | ICD-10-CM | POA: Diagnosis not present

## 2020-08-25 DIAGNOSIS — Z043 Encounter for examination and observation following other accident: Secondary | ICD-10-CM | POA: Diagnosis not present

## 2020-08-25 DIAGNOSIS — D72829 Elevated white blood cell count, unspecified: Secondary | ICD-10-CM

## 2020-08-25 DIAGNOSIS — Z8249 Family history of ischemic heart disease and other diseases of the circulatory system: Secondary | ICD-10-CM

## 2020-08-25 DIAGNOSIS — Z471 Aftercare following joint replacement surgery: Secondary | ICD-10-CM | POA: Diagnosis not present

## 2020-08-25 DIAGNOSIS — R0602 Shortness of breath: Secondary | ICD-10-CM | POA: Diagnosis not present

## 2020-08-25 DIAGNOSIS — R739 Hyperglycemia, unspecified: Secondary | ICD-10-CM

## 2020-08-25 DIAGNOSIS — Z9119 Patient's noncompliance with other medical treatment and regimen: Secondary | ICD-10-CM

## 2020-08-25 DIAGNOSIS — R0902 Hypoxemia: Secondary | ICD-10-CM | POA: Diagnosis not present

## 2020-08-25 DIAGNOSIS — S72012A Unspecified intracapsular fracture of left femur, initial encounter for closed fracture: Secondary | ICD-10-CM | POA: Diagnosis not present

## 2020-08-25 DIAGNOSIS — R269 Unspecified abnormalities of gait and mobility: Secondary | ICD-10-CM

## 2020-08-25 DIAGNOSIS — R7303 Prediabetes: Secondary | ICD-10-CM | POA: Diagnosis not present

## 2020-08-25 DIAGNOSIS — Z823 Family history of stroke: Secondary | ICD-10-CM

## 2020-08-25 DIAGNOSIS — M8580 Other specified disorders of bone density and structure, unspecified site: Secondary | ICD-10-CM | POA: Diagnosis present

## 2020-08-25 DIAGNOSIS — Z01818 Encounter for other preprocedural examination: Secondary | ICD-10-CM | POA: Diagnosis not present

## 2020-08-25 DIAGNOSIS — F32A Depression, unspecified: Secondary | ICD-10-CM | POA: Diagnosis not present

## 2020-08-25 DIAGNOSIS — S72002D Fracture of unspecified part of neck of left femur, subsequent encounter for closed fracture with routine healing: Secondary | ICD-10-CM | POA: Diagnosis not present

## 2020-08-25 DIAGNOSIS — E119 Type 2 diabetes mellitus without complications: Secondary | ICD-10-CM | POA: Diagnosis present

## 2020-08-25 DIAGNOSIS — Z79899 Other long term (current) drug therapy: Secondary | ICD-10-CM

## 2020-08-25 DIAGNOSIS — E782 Mixed hyperlipidemia: Secondary | ICD-10-CM | POA: Diagnosis not present

## 2020-08-25 DIAGNOSIS — I700 Atherosclerosis of aorta: Secondary | ICD-10-CM | POA: Diagnosis not present

## 2020-08-25 DIAGNOSIS — I714 Abdominal aortic aneurysm, without rupture: Secondary | ICD-10-CM | POA: Diagnosis present

## 2020-08-25 DIAGNOSIS — Z882 Allergy status to sulfonamides status: Secondary | ICD-10-CM

## 2020-08-25 DIAGNOSIS — Z96642 Presence of left artificial hip joint: Secondary | ICD-10-CM | POA: Diagnosis not present

## 2020-08-25 DIAGNOSIS — W1830XA Fall on same level, unspecified, initial encounter: Secondary | ICD-10-CM | POA: Diagnosis present

## 2020-08-25 DIAGNOSIS — M25552 Pain in left hip: Secondary | ICD-10-CM | POA: Diagnosis not present

## 2020-08-25 DIAGNOSIS — M47812 Spondylosis without myelopathy or radiculopathy, cervical region: Secondary | ICD-10-CM | POA: Diagnosis not present

## 2020-08-25 DIAGNOSIS — Z7982 Long term (current) use of aspirin: Secondary | ICD-10-CM

## 2020-08-25 DIAGNOSIS — S199XXA Unspecified injury of neck, initial encounter: Secondary | ICD-10-CM | POA: Diagnosis not present

## 2020-08-25 LAB — COMPREHENSIVE METABOLIC PANEL
ALT: 21 U/L (ref 0–44)
AST: 31 U/L (ref 15–41)
Albumin: 3.6 g/dL (ref 3.5–5.0)
Alkaline Phosphatase: 52 U/L (ref 38–126)
Anion gap: 8 (ref 5–15)
BUN: 23 mg/dL (ref 8–23)
CO2: 28 mmol/L (ref 22–32)
Calcium: 8.8 mg/dL — ABNORMAL LOW (ref 8.9–10.3)
Chloride: 98 mmol/L (ref 98–111)
Creatinine, Ser: 0.96 mg/dL (ref 0.44–1.00)
GFR, Estimated: 58 mL/min — ABNORMAL LOW (ref >60)
Glucose, Bld: 141 mg/dL — ABNORMAL HIGH (ref 70–99)
Potassium: 4.6 mmol/L (ref 3.5–5.1)
Sodium: 134 mmol/L — ABNORMAL LOW (ref 135–145)
Total Bilirubin: 1 mg/dL (ref 0.3–1.2)
Total Protein: 7.1 g/dL (ref 6.5–8.1)

## 2020-08-25 LAB — CBC WITH DIFFERENTIAL/PLATELET
Abs Immature Granulocytes: 0.05 10*3/uL (ref 0.00–0.07)
Basophils Absolute: 0.1 10*3/uL (ref 0.0–0.1)
Basophils Relative: 1 %
Eosinophils Absolute: 0.2 10*3/uL (ref 0.0–0.5)
Eosinophils Relative: 1 %
HCT: 43 % (ref 36.0–46.0)
Hemoglobin: 13.1 g/dL (ref 12.0–15.0)
Immature Granulocytes: 0 %
Lymphocytes Relative: 16 %
Lymphs Abs: 1.8 10*3/uL (ref 0.7–4.0)
MCH: 29.4 pg (ref 26.0–34.0)
MCHC: 30.5 g/dL (ref 30.0–36.0)
MCV: 96.6 fL (ref 80.0–100.0)
Monocytes Absolute: 0.6 10*3/uL (ref 0.1–1.0)
Monocytes Relative: 6 %
Neutro Abs: 8.6 10*3/uL — ABNORMAL HIGH (ref 1.7–7.7)
Neutrophils Relative %: 76 %
Platelets: 169 10*3/uL (ref 150–400)
RBC: 4.45 MIL/uL (ref 3.87–5.11)
RDW: 13.9 % (ref 11.5–15.5)
WBC: 11.3 10*3/uL — ABNORMAL HIGH (ref 4.0–10.5)
nRBC: 0 % (ref 0.0–0.2)

## 2020-08-25 LAB — BRAIN NATRIURETIC PEPTIDE: B Natriuretic Peptide: 133 pg/mL — ABNORMAL HIGH (ref 0.0–100.0)

## 2020-08-25 MED ORDER — ATENOLOL 25 MG PO TABS
50.0000 mg | ORAL_TABLET | Freq: Every day | ORAL | Status: DC
  Administered 2020-08-26 – 2020-08-31 (×5): 50 mg via ORAL
  Filled 2020-08-25 (×6): qty 2

## 2020-08-25 MED ORDER — HEPARIN SODIUM (PORCINE) 5000 UNIT/ML IJ SOLN
5000.0000 [IU] | Freq: Three times a day (TID) | INTRAMUSCULAR | Status: DC
  Administered 2020-08-25 – 2020-08-26 (×2): 5000 [IU] via SUBCUTANEOUS
  Filled 2020-08-25: qty 1

## 2020-08-25 MED ORDER — MORPHINE SULFATE (PF) 2 MG/ML IV SOLN
2.0000 mg | INTRAVENOUS | Status: DC | PRN
  Administered 2020-08-26: 2 mg via INTRAVENOUS
  Filled 2020-08-25: qty 1

## 2020-08-25 MED ORDER — ROSUVASTATIN CALCIUM 20 MG PO TABS
40.0000 mg | ORAL_TABLET | Freq: Every day | ORAL | Status: DC
  Administered 2020-08-26 – 2020-09-01 (×6): 40 mg via ORAL
  Filled 2020-08-25 (×6): qty 2

## 2020-08-25 MED ORDER — ACETAMINOPHEN 325 MG PO TABS
650.0000 mg | ORAL_TABLET | Freq: Four times a day (QID) | ORAL | Status: DC | PRN
  Administered 2020-08-25 – 2020-08-26 (×2): 650 mg via ORAL
  Filled 2020-08-25 (×2): qty 2

## 2020-08-25 MED ORDER — PANTOPRAZOLE SODIUM 40 MG PO TBEC
40.0000 mg | DELAYED_RELEASE_TABLET | Freq: Every day | ORAL | Status: DC
  Administered 2020-08-26 – 2020-09-01 (×6): 40 mg via ORAL
  Filled 2020-08-25 (×6): qty 1

## 2020-08-25 NOTE — ED Notes (Signed)
Pt stated she does not use oxygen at home. RA oxygen is 88% . Nurse applied 2 lpm and above 90% now.

## 2020-08-25 NOTE — ED Triage Notes (Signed)
Fell last night, pain in left leg

## 2020-08-25 NOTE — ED Provider Notes (Signed)
[place] Provider Note   CSN: [id number] Arrival date & time: 08/25/20  1446     History Chief Complaint  Patient presents with  . Fall    [name] is a 87 y.o. female.  Patient states that she fell yesterday.  And her son picked her up in her bed.  Today she was unable to walk.  Patient complains of pain in left hip  The history is provided by the patient and medical records. No language interpreter was used.  Fall This is a new problem. The current episode started 12 to 24 hours ago. The problem occurs rarely. The problem has been resolved. Pertinent negatives include no chest pain, no abdominal pain and no headaches. Exacerbated by: Movement. Nothing relieves the symptoms. She has tried nothing for the symptoms. The treatment provided no relief.       Past Medical History:  Diagnosis Date  . [name] (abdominal aortic aneurysm) ([place]) 09/2013   3.6 cm  . Acute and chronic respiratory failure with hypoxia ([place])   . Acute delirium   . Acute encephalopathy   . Acute pyelonephritis   . Atrial fibrillation ([place])   . Chronic pain syndrome   . Constipation   . Dementia ([place])   . Dependent edema    bilateral legs   . Depression   . Diabetes mellitus    pre-diabetes  . Diverticulosis   . Diverticulosis   . Dysuria   . E coli bacteremia   . Fall   . Gross hematuria   . Hydronephrosis with renal and ureteral calculous obstruction   . Hyperlipidemia   . Hypertension   . Hypoxemia   . Idiopathic peripheral autonomic neuropathy   . Infestation by bed bug   . Nephrolithiasis   . OSA on CPAP   . Osteoarthritis   . Osteopenia    T=  -2.1 in hip  . Persistent mood (affective) disorder, unspecified ([place])   . Prediabetes   . Rheumatoid arthritis ([place])   . Rheumatoid arthritis(714.0)   . S/P carpal tunnel release   . Sepsis secondary to UTI ([place])   . Spinal stenosis   . Unspecified fracture of fifth lumbar vertebra, initial encounter for closed  fracture ([place])   . Unspecified fracture of t11-T12 vertebra, initial encounter for closed fracture ([place])   . Urinary incontinence   . Urinary tract infection    hx of   . Urolithiasis     Patient Active Problem List   Diagnosis Date Noted  . Cerebrovascular accident (CVA) due to stenosis of basilar artery ([place]) 07/17/2019  . Dyspnea 07/09/2019  . Acute diastolic CHF (congestive heart failure) ([place]) 07/09/2019  . Chest pain   . Coronary artery calcification seen on CT scan   . Gait abnormality 01/24/2019  . Tremor 01/24/2019  . Memory loss 01/24/2019  . Atelectasis of left lung 04/06/2018  . Acute encephalopathy 02/09/2018  . Urinary tract infection due to extended-spectrum beta lactamase (ESBL) producing Escherichia coli 02/02/2018  . Chronic a-fib ([place]) 02/02/2018  . Constipation 01/31/2018  . Urolithiasis 01/30/2018  . T12 compression fracture ([place]) 09/04/2017  . Tachycardia 02/03/2017  . Chronic pain syndrome 02/02/2017  . Abnormal EKG 11/21/2013  . [name] (abdominal aortic aneurysm) ([place]) 09/30/2013  . OSA (obstructive sleep apnea) 06/29/2012  . Dementia ([place]) 06/29/2012  . Pre-diabetes 06/29/2012  . Hypoxia, sleep related 06/29/2012  . RA (rheumatoid arthritis) ([place]) 06/29/2012  . Nonspecific abnormal results of cardiovascular function study 06/02/2011  .  Nonspecific abnormal electrocardiogram (ECG) (EKG) 05/18/2011  . Osteoporosis 04/09/2008  . Hyperlipidemia 05/12/2006  . DEPENDENT EDEMA, LEGS, BILATERAL 05/12/2006  . PERIPHERAL NEUROPATHY 03/11/2006  . Essential hypertension 03/11/2006    Past Surgical History:  Procedure Laterality Date  . APPENDECTOMY    . CARPAL TUNNEL RELEASE  12/27/2006   Right subcutaneous ulnar nerve transfer -- Right open carpal tunnel release  . CHOLECYSTECTOMY    . CYSTOSCOPY W/ URETERAL STENT PLACEMENT Right 01/30/2018   Procedure: CYSTOSCOPY WITH STENT REPLACEMENT retrograde pylegram;  Surgeon: Ardis Hughs, MD;  Location: [facility]  ORS;  Service: Urology;  Laterality: Right;  . CYSTOSCOPY WITH URETEROSCOPY AND STENT PLACEMENT Right 03/01/2018   Procedure: CYSTOSCPY/RIGHT URETEROSCOPY HOLMIUM LASER LITHOTRIPSY AND STENT EXCHANGE;  Surgeon: Ardis Hughs, MD;  Location: [facility] ORS;  Service: Urology;  Laterality: Right;  . FOOT SURGERY    . HOLMIUM LASER APPLICATION Right 95/28/4132   Procedure: HOLMIUM LASER APPLICATION;  Surgeon: Ardis Hughs, MD;  Location: [facility]S;  Service: Urology;  Laterality: Right;  . JOINT REPLACEMENT     BTKR, RTHR  . TOTAL KNEE ARTHROPLASTY  08/21/2007   Right total knee replacement     OB History   No obstetric history on file.     Family History  Problem Relation Age of Onset  . Stroke Father   . Coronary artery disease Son        CABG at age 90  . Heart disease Son   . Heart attack Son   . Stroke Mother   . Hypertension Sister   . Diabetes Brother   . Hypertension Maternal Aunt     Social History   Tobacco Use  . Smoking status: Never Smoker  . Smokeless tobacco: Never Used  Vaping Use  . Vaping Use: Never used  Substance Use Topics  . Alcohol use: No  . Drug use: No    Home Medications Prior to Admission medications   Medication Sig Start Date End Date Taking? Authorizing Provider  acetaminophen (TYLENOL) 500 MG tablet Take 500 mg by mouth every 8 (eight) hours as needed for mild pain.     [provider]  ammonium lactate (AMLACTIN) 12 % cream Apply topically as needed for dry skin. 08/17/18   Trula Slade, DPM  aspirin EC 81 MG tablet Take 81 mg by mouth daily.    [provider]  atenolol (TENORMIN) 50 MG tablet Take 1 tablet (50 mg total) by mouth daily. 05/19/20   [name], MD  Calcium Carbonate-Vitamin D3 600-400 MG-UNIT TABS Take 1 tablet by mouth daily. 02/04/20   [provider]  carbamide peroxide (DEBROX) 6.5 % OTIC solution Place 5 drops into both ears 2 (two) times daily. 07/07/20   [name], MD   Cholecalciferol 50 MCG (2000 UT) TABS Take by mouth.    [provider]  diclofenac sodium (VOLTAREN) 1 % GEL Apply 2 g topically 4 (four) times daily. Rub into affected area of foot 2 to 4 times daily 08/17/18   Trula Slade, DPM  DULoxetine (CYMBALTA) 30 MG capsule Take 1 capsule (30 mg total) by mouth daily. Takes along with 60mg  for total daily dose of 90mg . 01/07/20   [name], MD  DULoxetine (CYMBALTA) 60 MG capsule Take 1 capsule (60 mg total) by mouth daily. Take along with 30mg  for a total daily dose of 90mg . 01/07/20   [name], MD  gabapentin (NEURONTIN) 800 MG tablet Take 1  tablet (800 mg total) by mouth 3 (three) times daily. 08/17/20   Marcial Pacas, MD  ketoconazole (NIZORAL) 2 % cream APPLY 1 APPLICATION TOPICALLY DAILY 05/22/20   Evelina Bucy, DPM  mupirocin ointment (BACTROBAN) 2 % APPLY TO WOUND TWICE DAILY 05/22/20   Evelina Bucy, DPM  nystatin cream (MYCOSTATIN) Apply 1 application topically 2 (two) times daily. 04/07/20   Elmore, Modena Nunnery, MD  oxyCODONE-acetaminophen (PERCOCET) 7.5-325 MG tablet Take 1 tablet by mouth every 8 (eight) hours as needed for severe pain. 07/07/20   [name], MD  pantoprazole (PROTONIX) 40 MG tablet TAKE 1 TABLET BY MOUTH ONCE DAILY 07/21/20   [name], MD  rosuvastatin (CRESTOR) 40 MG tablet TAKE 1 TABLET BY MOUTH EACH EVENING 08/20/20   [name], MD    Allergies    Sulfonamide derivatives  Review of Systems   Review of Systems  Constitutional: Negative for appetite change and fatigue.  HENT: Negative for congestion, ear discharge and sinus pressure.   Eyes: Negative for discharge.  Respiratory: Negative for cough.   Cardiovascular: Negative for chest pain.  Gastrointestinal: Negative for abdominal pain and diarrhea.  Genitourinary: Negative for frequency and hematuria.  Musculoskeletal: Negative for back pain.       Left hip pain  Skin: Negative for rash.  Neurological: Negative for  seizures and headaches.  Psychiatric/Behavioral: Negative for hallucinations.    Physical Exam Updated Vital Signs BP (!) 136/113   Pulse 97   Temp 98.8 F (37.1 C) (Oral)   Resp 18   SpO2 95%   Physical Exam Vitals and nursing note reviewed.  Constitutional:      Appearance: She is well-developed.  HENT:     Head: Normocephalic.     Nose: Nose normal.  Eyes:     General: No scleral icterus.    Conjunctiva/sclera: Conjunctivae normal.  Neck:     Thyroid: No thyromegaly.  Cardiovascular:     Rate and Rhythm: Normal rate and regular rhythm.     Heart sounds: No murmur heard. No friction rub. No gallop.   Pulmonary:     Breath sounds: No stridor. No wheezing or rales.  Chest:     Chest wall: No tenderness.  Abdominal:     General: There is no distension.     Tenderness: There is no abdominal tenderness. There is no rebound.  Musculoskeletal:     Cervical back: Neck supple.     Comments: Patient tender to left hip.  Lymphadenopathy:     Cervical: No cervical adenopathy.  Skin:    Findings: No erythema or rash.  Neurological:     Mental Status: She is alert and oriented to person, place, and time.     Motor: No abnormal muscle tone.     Coordination: Coordination normal.  Psychiatric:        Behavior: Behavior normal.     ED Results / Procedures / Treatments   Labs (all labs ordered are listed, but only abnormal results are displayed) Labs Reviewed  CBC WITH DIFFERENTIAL/PLATELET - Abnormal; Notable for the following components:      Result Value   WBC 11.3 (*)    Neutro Abs 8.6 (*)    All other components within normal limits  COMPREHENSIVE METABOLIC PANEL - Abnormal; Notable for the following components:   Sodium 134 (*)    Glucose, Bld 141 (*)    Calcium 8.8 (*)    GFR, Estimated 58 (*)    All  other components within normal limits  BRAIN NATRIURETIC PEPTIDE - Abnormal; Notable for the following components:   B Natriuretic Peptide 133.0 (*)    All  other components within normal limits  SARS CORONAVIRUS 2 (TAT 6-24 HRS)    EKG None  Radiology CT Head Wo Contrast  Result Date: 08/25/2020 CLINICAL DATA:  Un witnessed fall last night unsure of head injury EXAM: CT HEAD WITHOUT CONTRAST CT CERVICAL SPINE WITHOUT CONTRAST TECHNIQUE: Multidetector CT imaging of the head and cervical spine was performed following the standard protocol without intravenous contrast. Multiplanar CT image reconstructions of the cervical spine were also generated. COMPARISON:  March 27, 2018 and February 23, 2018. FINDINGS: CT HEAD FINDINGS Brain: No evidence of acute large vascular territory infarction, hemorrhage, extra-axial collection or mass lesion/mass effect. Stable global parenchymal volume loss with ex vacuo dilatation of ventricular system. Similar severe burden chronic microvascular ischemic white matter disease. Chronic low type infarcts in the right pons and bilateral basal ganglia. Vascular: No hyperdense vessel. Dense atherosclerotic calcifications of the skull base vessels with involvement of the bilateral MCAs. Skull: Normal. Negative for fracture or focal lesion. Sinuses/Orbits: The paranasal sinuses and mastoid air cells are predominantly clear. Orbits are grossly unremarkable. Other: None CT CERVICAL SPINE FINDINGS Alignment: Normal with preservation of the cervical lordosis. Degenerative anterolisthesis of C5 on C6. Skull base and vertebrae: No acute fracture. No primary bone lesion or focal pathologic process. Soft tissues and spinal canal: No prevertebral fluid or swelling. No visible canal hematoma. Mineralized pannus posterior to the dens. Disc levels: Multilevel degenerative changes cervical spine including disc space narrowing, disc osteophyte complex and uncovertebral/facet hypertrophy most notable at C6-C7 Upper chest: None Other: Dense carotid artery calcifications. IMPRESSION: 1. No evidence of acute intracranial pathology. 2. Similar global  parenchymal volume loss and sequela of microvascular ischemic disease. 3. No evidence of acute displaced fracture or subluxation of the cervical spine. 4. Multilevel degenerative changes of the cervical spine. 5. Dense carotid artery calcifications. Electronically Signed   By: Dahlia Bailiff MD   On: 08/25/2020 17:36   CT Cervical Spine Wo Contrast  Result Date: 08/25/2020 CLINICAL DATA:  Un witnessed fall last night unsure of head injury EXAM: CT HEAD WITHOUT CONTRAST CT CERVICAL SPINE WITHOUT CONTRAST TECHNIQUE: Multidetector CT imaging of the head and cervical spine was performed following the standard protocol without intravenous contrast. Multiplanar CT image reconstructions of the cervical spine were also generated. COMPARISON:  March 27, 2018 and February 23, 2018. FINDINGS: CT HEAD FINDINGS Brain: No evidence of acute large vascular territory infarction, hemorrhage, extra-axial collection or mass lesion/mass effect. Stable global parenchymal volume loss with ex vacuo dilatation of ventricular system. Similar severe burden chronic microvascular ischemic white matter disease. Chronic low type infarcts in the right pons and bilateral basal ganglia. Vascular: No hyperdense vessel. Dense atherosclerotic calcifications of the skull base vessels with involvement of the bilateral MCAs. Skull: Normal. Negative for fracture or focal lesion. Sinuses/Orbits: The paranasal sinuses and mastoid air cells are predominantly clear. Orbits are grossly unremarkable. Other: None CT CERVICAL SPINE FINDINGS Alignment: Normal with preservation of the cervical lordosis. Degenerative anterolisthesis of C5 on C6. Skull base and vertebrae: No acute fracture. No primary bone lesion or focal pathologic process. Soft tissues and spinal canal: No prevertebral fluid or swelling. No visible canal hematoma. Mineralized pannus posterior to the dens. Disc levels: Multilevel degenerative changes cervical spine including disc space  narrowing, disc osteophyte complex and uncovertebral/facet hypertrophy most notable at C6-C7 Upper chest:  None Other: Dense carotid artery calcifications. IMPRESSION: 1. No evidence of acute intracranial pathology. 2. Similar global parenchymal volume loss and sequela of microvascular ischemic disease. 3. No evidence of acute displaced fracture or subluxation of the cervical spine. 4. Multilevel degenerative changes of the cervical spine. 5. Dense carotid artery calcifications. Electronically Signed   By: Dahlia Bailiff MD   On: 08/25/2020 17:36   DG Chest Port 1 View  Result Date: 08/25/2020 CLINICAL DATA:  Fall EXAM: PORTABLE CHEST 1 VIEW COMPARISON:  May 21, 2020. FINDINGS: Unchanged cardiac enlargement. Tortuous calcified thoracic aorta. No focal consolidation. No significant pleural effusion or visible pneumothorax. Chronic thoracolumbar junction compression deformity. IMPRESSION: Stable exam, no acute pulmonary findings. Electronically Signed   By: Dahlia Bailiff MD   On: 08/25/2020 17:38   DG Hip Unilat W or Wo Pelvis 2-3 Views Left  Result Date: 08/25/2020 CLINICAL DATA:  Left hip pain after fall. EXAM: DG HIP (WITH OR WITHOUT PELVIS) 2-3V LEFT COMPARISON:  July 03, 2017. FINDINGS: Mildly displaced and foreshortened left subcapital hip fracture. The femoral head appears well-seated within the acetabulum. Pelvic phleboliths. Vascular calcifications. IMPRESSION: Mildly displaced and foreshortened subcapital left hip fracture. Electronically Signed   By: Dahlia Bailiff MD   On: 08/25/2020 17:24    Procedures Procedures   Medications Ordered in ED Medications - No data to display  ED Course  I have reviewed the triage vital signs and the nursing notes.  Pertinent labs & imaging results that were available during my care of the patient were reviewed by me and considered in my medical decision making (see chart for details).    MDM Rules/Calculators/A&P                         Patient  with left hip fracture.  CT head was negative.  She will be admitted to medicine and orthopedics will see her tomorrow and possibly do her surgery Thursday  Final Clinical Impression(s) / ED Diagnoses Final diagnoses:  Closed fracture of left hip, initial encounter [facility])    Rx / [place] Orders ED Discharge Orders    None       Milton Ferguson, MD 08/25/20 Joen Laura

## 2020-08-25 NOTE — ED Notes (Signed)
Bilateral feet pulses noted strong.

## 2020-08-25 NOTE — ED Notes (Signed)
Placed on nasal cannula at 3 liters and sat id 98%

## 2020-08-25 NOTE — H&P (Addendum)
History and Physical  [name] JIR:[id number] DOB: [birth year] DOA: 08/25/2020  Referring physician:  Milton Ferguson, MD PCP: [name], MD  Patient coming from: Home  Chief Complaint: Fall at home  HPI: [name] is an 87 y.o. female with medical history significant for atrial fibrillation, hypertension, hyperlipidemia, GERD, gait instability (ambulates with a walker) who presents to the emergency department via EMS due to fall sustained around 2:30 AM today.  Patient states that she got up at night to use the restroom and she came back to bed, shortly after this, she got up again for an unknown reason and sustained a fall.  Son at bedside, helped her back to bed, this morning when the home health aide tried to assist her to go to the bathroom, she was unable to get up due to left hip pain.  EMS was activated and patient was taken to the ED for further evaluation and management.  ED Course:  In the emergency department, she was intermittently tachycardic.  She was noted to have an O2 sat of 88.  On room air, so she was placed on supplemental oxygen at 2 LPM with improvement in O2 sats to 92-98%.  Work-up in the ED showed leukocytosis, hyperglycemia, BNP 133. Left hip x-ray showed mildly displaced and foreshortened subcapital left hip fracture CT of head and CT cervical spine without contrast showed no evidence of acute intracranial pathology and no evidence of acute displaced fracture or subluxation of the cervical spine. Orthopedic surgeon was consulted and will see patient in the morning with plan to operate on patient on Thursday (4/28) per ED physician.  Hospitalist was asked to admit patient for further evaluation and management.  Review of Systems: Constitutional: Negative for chills and fever.  HENT: Negative for ear pain and sore throat.   Eyes: Negative for pain and visual disturbance.  Respiratory: Negative for cough, chest tightness and shortness of breath.    Cardiovascular: Negative for chest pain and palpitations.  Gastrointestinal: Negative for abdominal pain and vomiting.  Endocrine: Negative for polyphagia and polyuria.  Genitourinary: Negative for decreased urine volume, dysuria Musculoskeletal: Positive for left hip pain.  Negative for back pain.  Skin: Negative for color change and rash.  Allergic/Immunologic: Negative for immunocompromised state.  Neurological: Negative for tremors, syncope, speech difficulty, weakness, light-headedness and headaches.  Hematological: Does not bruise/bleed easily.  All other systems reviewed and are negative   Past Medical History:  Diagnosis Date  . [name] (abdominal aortic aneurysm) ([place]) 09/2013   3.6 cm  . Acute and chronic respiratory failure with hypoxia ([place])   . Acute delirium   . Acute encephalopathy   . Acute pyelonephritis   . Atrial fibrillation ([place])   . Chronic pain syndrome   . Constipation   . Dementia ([place])   . Dependent edema    bilateral legs   . Depression   . Diabetes mellitus    pre-diabetes  . Diverticulosis   . Diverticulosis   . Dysuria   . E coli bacteremia   . Fall   . Gross hematuria   . Hydronephrosis with renal and ureteral calculous obstruction   . Hyperlipidemia   . Hypertension   . Hypoxemia   . Idiopathic peripheral autonomic neuropathy   . Infestation by bed bug   . Nephrolithiasis   . OSA on CPAP   . Osteoarthritis   . Osteopenia    T=  -2.1 in hip  . Persistent mood (affective) disorder, unspecified (  [place])   . Prediabetes   . Rheumatoid arthritis ([place])   . Rheumatoid arthritis(714.0)   . S/P carpal tunnel release   . Sepsis secondary to UTI ([place])   . Spinal stenosis   . Unspecified fracture of fifth lumbar vertebra, initial encounter for closed fracture ([place])   . Unspecified fracture of t11-T12 vertebra, initial encounter for closed fracture ([place])   . Urinary incontinence   . Urinary tract infection    hx of   . Urolithiasis    Past  Surgical History:  Procedure Laterality Date  . APPENDECTOMY    . CARPAL TUNNEL RELEASE  12/27/2006   Right subcutaneous ulnar nerve transfer -- Right open carpal tunnel release  . CHOLECYSTECTOMY    . CYSTOSCOPY W/ URETERAL STENT PLACEMENT Right 01/30/2018   Procedure: CYSTOSCOPY WITH STENT REPLACEMENT retrograde pylegram;  Surgeon: Ardis Hughs, MD;  Location: [facility];  Service: Urology;  Laterality: Right;  . CYSTOSCOPY WITH URETEROSCOPY AND STENT PLACEMENT Right 03/01/2018   Procedure: CYSTOSCPY/RIGHT URETEROSCOPY HOLMIUM LASER LITHOTRIPSY AND STENT EXCHANGE;  Surgeon: Ardis Hughs, MD;  Location: [facility];  Service: Urology;  Laterality: Right;  . FOOT SURGERY    . HOLMIUM LASER APPLICATION Right [id number]   Procedure: HOLMIUM LASER APPLICATION;  Surgeon: Ardis Hughs, MD;  Location: [facility];  Service: Urology;  Laterality: Right;  . JOINT REPLACEMENT     BTKR, RTHR  . TOTAL KNEE ARTHROPLASTY  08/21/2007   Right total knee replacement    Social History:  reports that she has never smoked. She has never used smokeless tobacco. She reports that she does not drink alcohol and does not use drugs.   Allergies  Allergen Reactions  . Sulfonamide Derivatives Hives and Rash    Family History  Problem Relation Age of Onset  . Stroke Father   . Coronary artery disease Son        CABG at age 31  . Heart disease Son   . Heart attack Son   . Stroke Mother   . Hypertension Sister   . Diabetes Brother   . Hypertension Maternal Aunt       Prior to Admission medications   Medication Sig Start Date End Date Taking? Authorizing Provider  acetaminophen (TYLENOL) 500 MG tablet Take 500 mg by mouth every 8 (eight) hours as needed for mild pain.     [provider]  ammonium lactate (AMLACTIN) 12 % cream Apply topically as needed for dry skin. 08/17/18   Trula Slade, DPM  aspirin EC 81 MG tablet Take 81 mg by mouth daily.    [provider]   atenolol (TENORMIN) 50 MG tablet Take 1 tablet (50 mg total) by mouth daily. 05/19/20   [name], MD  Calcium Carbonate-Vitamin D3 600-400 MG-UNIT TABS Take 1 tablet by mouth daily. 02/04/20   [provider]  carbamide peroxide (DEBROX) 6.5 % OTIC solution Place 5 drops into both ears 2 (two) times daily. 07/07/20   [name], MD  Cholecalciferol 50 MCG (2000 UT) TABS Take by mouth.    [provider]  diclofenac sodium (VOLTAREN) 1 % GEL Apply 2 g topically 4 (four) times daily. Rub into affected area of foot 2 to 4 times daily 08/17/18   Trula Slade, DPM  DULoxetine (CYMBALTA) 30 MG capsule Take 1 capsule (30 mg total) by mouth daily. Takes along with 60mg  for total daily dose of 90mg . 01/07/20   [name], MD  DULoxetine (CYMBALTA) 60 MG capsule Take 1 capsule (60 mg total) by mouth daily. Take along with 30mg  for a total daily dose of 90mg . 01/07/20   [name], MD  gabapentin (NEURONTIN) 800 MG tablet Take 1 tablet (800 mg total) by mouth 3 (three) times daily. 08/17/20   Marcial Pacas, MD  ketoconazole (NIZORAL) 2 % cream APPLY 1 APPLICATION TOPICALLY DAILY 05/22/20   Evelina Bucy, DPM  mupirocin ointment (BACTROBAN) 2 % APPLY TO WOUND TWICE DAILY 05/22/20   Evelina Bucy, DPM  nystatin cream (MYCOSTATIN) Apply 1 application topically 2 (two) times daily. 04/07/20   , Modena Nunnery, MD  oxyCODONE-acetaminophen (PERCOCET) 7.5-325 MG tablet Take 1 tablet by mouth every 8 (eight) hours as needed for severe pain. 07/07/20   [name], MD  pantoprazole (PROTONIX) 40 MG tablet TAKE 1 TABLET BY MOUTH ONCE DAILY 07/21/20   [name], MD  rosuvastatin (CRESTOR) 40 MG tablet TAKE 1 TABLET BY MOUTH EACH EVENING 08/20/20   [name], MD    Physical Exam: BP (!) 136/113   Pulse 97   Temp 98.8 F (37.1 C) (Oral)   Resp 18   SpO2 95%   . General: 87 y.o. year-old female well developed well nourished in no acute distress.  Alert  and oriented x3. Marland Kitchen HEENT: NCAT, EOMI . Neck: Supple, trachea medial . Cardiovascular: Irregular rate and rhythm with no rubs or gallops.  No thyromegaly or JVD noted.  2/4 pulses in all 4 extremities. Marland Kitchen Respiratory: Clear to auscultation with no wheezes or rales. Good inspiratory effort. . Abdomen: Soft, nontender nondistended with normal bowel sounds x4 quadrants. . Muskuloskeletal: Tender to palpation of left hip, decrease ROM of LLE due to pain.  No cyanosis, clubbing or edema noted bilaterally . Neuro: CN II-XII intact, sensation, reflexes intact . Skin: No ulcerative lesions noted or rashes . Psychiatry: Mood is appropriate for condition and setting          Labs on Admission:  Basic Metabolic Panel: Recent Labs  Lab 08/25/20 1610  NA 134*  K 4.6  CL 98  CO2 28  GLUCOSE 141*  BUN 23  CREATININE 0.96  CALCIUM 8.8*   Liver Function Tests: Recent Labs  Lab 08/25/20 1610  AST 31  ALT 21  ALKPHOS 52  BILITOT 1.0  PROT 7.1  ALBUMIN 3.6   No results for input(s): LIPASE, AMYLASE in the last 168 hours. No results for input(s): AMMONIA in the last 168 hours. CBC: Recent Labs  Lab 08/25/20 1610  WBC 11.3*  NEUTROABS 8.6*  HGB 13.1  HCT 43.0  MCV 96.6  PLT 169   Cardiac Enzymes: No results for input(s): CKTOTAL, CKMB, CKMBINDEX, TROPONINI in the last 168 hours.  BNP (last 3 results) Recent Labs    08/25/20 1603  BNP 133.0*    ProBNP (last 3 results) No results for input(s): PROBNP in the last 8760 hours.  CBG: No results for input(s): GLUCAP in the last 168 hours.  Radiological Exams on Admission: CT Head Wo Contrast  Result Date: 08/25/2020 CLINICAL DATA:  Un witnessed fall last night unsure of head injury EXAM: CT HEAD WITHOUT CONTRAST CT CERVICAL SPINE WITHOUT CONTRAST TECHNIQUE: Multidetector CT imaging of the head and cervical spine was performed following the standard protocol without intravenous contrast. Multiplanar CT image reconstructions of  the cervical spine were also generated. COMPARISON:  March 27, 2018 and February 23, 2018. FINDINGS: CT HEAD FINDINGS Brain: No evidence of  acute large vascular territory infarction, hemorrhage, extra-axial collection or mass lesion/mass effect. Stable global parenchymal volume loss with ex vacuo dilatation of ventricular system. Similar severe burden chronic microvascular ischemic white matter disease. Chronic low type infarcts in the right pons and bilateral basal ganglia. Vascular: No hyperdense vessel. Dense atherosclerotic calcifications of the skull base vessels with involvement of the bilateral MCAs. Skull: Normal. Negative for fracture or focal lesion. Sinuses/Orbits: The paranasal sinuses and mastoid air cells are predominantly clear. Orbits are grossly unremarkable. Other: None CT CERVICAL SPINE FINDINGS Alignment: Normal with preservation of the cervical lordosis. Degenerative anterolisthesis of C5 on C6. Skull base and vertebrae: No acute fracture. No primary bone lesion or focal pathologic process. Soft tissues and spinal canal: No prevertebral fluid or swelling. No visible canal hematoma. Mineralized pannus posterior to the dens. Disc levels: Multilevel degenerative changes cervical spine including disc space narrowing, disc osteophyte complex and uncovertebral/facet hypertrophy most notable at C6-C7 Upper chest: None Other: Dense carotid artery calcifications. IMPRESSION: 1. No evidence of acute intracranial pathology. 2. Similar global parenchymal volume loss and sequela of microvascular ischemic disease. 3. No evidence of acute displaced fracture or subluxation of the cervical spine. 4. Multilevel degenerative changes of the cervical spine. 5. Dense carotid artery calcifications. Electronically Signed   By: Dahlia Bailiff MD   On: 08/25/2020 17:36   CT Cervical Spine Wo Contrast  Result Date: 08/25/2020 CLINICAL DATA:  Un witnessed fall last night unsure of head injury EXAM: CT HEAD WITHOUT  CONTRAST CT CERVICAL SPINE WITHOUT CONTRAST TECHNIQUE: Multidetector CT imaging of the head and cervical spine was performed following the standard protocol without intravenous contrast. Multiplanar CT image reconstructions of the cervical spine were also generated. COMPARISON:  March 27, 2018 and February 23, 2018. FINDINGS: CT HEAD FINDINGS Brain: No evidence of acute large vascular territory infarction, hemorrhage, extra-axial collection or mass lesion/mass effect. Stable global parenchymal volume loss with ex vacuo dilatation of ventricular system. Similar severe burden chronic microvascular ischemic white matter disease. Chronic low type infarcts in the right pons and bilateral basal ganglia. Vascular: No hyperdense vessel. Dense atherosclerotic calcifications of the skull base vessels with involvement of the bilateral MCAs. Skull: Normal. Negative for fracture or focal lesion. Sinuses/Orbits: The paranasal sinuses and mastoid air cells are predominantly clear. Orbits are grossly unremarkable. Other: None CT CERVICAL SPINE FINDINGS Alignment: Normal with preservation of the cervical lordosis. Degenerative anterolisthesis of C5 on C6. Skull base and vertebrae: No acute fracture. No primary bone lesion or focal pathologic process. Soft tissues and spinal canal: No prevertebral fluid or swelling. No visible canal hematoma. Mineralized pannus posterior to the dens. Disc levels: Multilevel degenerative changes cervical spine including disc space narrowing, disc osteophyte complex and uncovertebral/facet hypertrophy most notable at C6-C7 Upper chest: None Other: Dense carotid artery calcifications. IMPRESSION: 1. No evidence of acute intracranial pathology. 2. Similar global parenchymal volume loss and sequela of microvascular ischemic disease. 3. No evidence of acute displaced fracture or subluxation of the cervical spine. 4. Multilevel degenerative changes of the cervical spine. 5. Dense carotid artery  calcifications. Electronically Signed   By: Dahlia Bailiff MD   On: 08/25/2020 17:36   DG Chest Port 1 View  Result Date: 08/25/2020 CLINICAL DATA:  Fall EXAM: PORTABLE CHEST 1 VIEW COMPARISON:  May 21, 2020. FINDINGS: Unchanged cardiac enlargement. Tortuous calcified thoracic aorta. No focal consolidation. No significant pleural effusion or visible pneumothorax. Chronic thoracolumbar junction compression deformity. IMPRESSION: Stable exam, no acute pulmonary findings. Electronically Signed  By: Dahlia Bailiff MD   On: 08/25/2020 17:38   DG Hip Unilat W or Wo Pelvis 2-3 Views Left  Result Date: 08/25/2020 CLINICAL DATA:  Left hip pain after fall. EXAM: DG HIP (WITH OR WITHOUT PELVIS) 2-3V LEFT COMPARISON:  July 03, 2017. FINDINGS: Mildly displaced and foreshortened left subcapital hip fracture. The femoral head appears well-seated within the acetabulum. Pelvic phleboliths. Vascular calcifications. IMPRESSION: Mildly displaced and foreshortened subcapital left hip fracture. Electronically Signed   By: Dahlia Bailiff MD   On: 08/25/2020 17:24    EKG: I independently viewed the EKG done and my findings are as followed: Atrial fibrillation with RVR  Assessment/Plan Present on Admission: . Closed left hip fracture ([place]) . Essential hypertension . Hyperlipidemia . Pre-diabetes . OSA (obstructive sleep apnea)  Principal Problem:   Closed left hip fracture (H[facility]) Active Problems:   Hyperlipidemia   Essential hypertension   OSA (obstructive sleep apnea)   Pre-diabetes   Fall at home, initial encounter   Atrial fibrillation, chronic ([facility])   Gait abnormality   Leukocytosis   Hyperglycemia   Elevated brain natriuretic peptide (BNP) level   GERD (gastroesophageal reflux disease)  Left subcapital hip fracture secondary to accidental fall due to gait abnormality Left hip x-ray showed mildly displaced and foreshortened subcapital left hip fracture Continue Tylenol as needed for mild  pain Continue IV morphine 2 mg every 4 hours as needed for moderate/severe pain Continue fall precaution and neurochecks Orthopedic surgeon already consulted and will see patient in the morning with plan to operate on patient on Thursday (4/28) per ED physician Continue PT/OT eval and treat  Acute respiratory failure with hypoxia due to unknown cause  Patient was noted to be hypoxic with an O2 sat of 88% on arrival to the ED, this improved with supplemental oxygen at 2 LPM Chest x-ray showed stable exam, no acute pulmonary findings Patient has no history of tobacco abuse  Hyperglycemia secondary to prediabetes CBG 141; continue carb modified diet  Leukocytosis possibly reactive WBC 11.3;, continue to monitor WBC with morning labs  Elevated BNP (chronically elevated) BNP 133;, patient denies dyspnea on exertion, peripheral edema, orthopnea.  No JVD on physical exam Echocardiogram done on 07/10/2019 showed LVEF of 60 to 35% with normal RWMA  Essential hypertension Continue atenolol  Hyperlipidemia Continue Crestor  Chronic atrial fibrillation CHADs-Vasc score= 4 Continue atenolol Continue aspirin (patient was not placed on anticoagulant due to increased bleeding risk from possible falls per son)  GERD Continue Protonix  Obstructive sleep apnea Son states that patient's CPAP has long been broken and patient has not been able to replace it CPAP will be provided while in the hospital Patient will need follow-up with pulmonologist for repeat sleep test/CPAP replacement on discharge   DVT prophylaxis: SCDs, heparin subcu (consider discontinuing this prior to anticipated surgery-exact time unknown at this time)  Code Status: Full code  Family Communication: Son at bedside (all questions answered to satisfaction)  Disposition Plan:  Patient is from:                        home Anticipated DC to:                   SNF or family members home Anticipated DC date:                2-3 days Anticipated DC barriers:           Patient requires inpatient  management due to left hip fracture pending surgical repair  Consults called: Orthopedic surgery  Admission status: Inpatient    Bernadette Hoit MD [facility]  08/25/2020, 9:19 PM

## 2020-08-26 ENCOUNTER — Other Ambulatory Visit: Payer: Self-pay

## 2020-08-26 ENCOUNTER — Inpatient Hospital Stay (HOSPITAL_COMMUNITY): Payer: Medicare Other

## 2020-08-26 DIAGNOSIS — Z01818 Encounter for other preprocedural examination: Secondary | ICD-10-CM

## 2020-08-26 DIAGNOSIS — S72002A Fracture of unspecified part of neck of left femur, initial encounter for closed fracture: Secondary | ICD-10-CM | POA: Diagnosis not present

## 2020-08-26 DIAGNOSIS — I482 Chronic atrial fibrillation, unspecified: Secondary | ICD-10-CM | POA: Diagnosis not present

## 2020-08-26 LAB — CBC
HCT: 40.3 % (ref 36.0–46.0)
Hemoglobin: 12.3 g/dL (ref 12.0–15.0)
MCH: 29.8 pg (ref 26.0–34.0)
MCHC: 30.5 g/dL (ref 30.0–36.0)
MCV: 97.6 fL (ref 80.0–100.0)
Platelets: 148 10*3/uL — ABNORMAL LOW (ref 150–400)
RBC: 4.13 MIL/uL (ref 3.87–5.11)
RDW: 13.8 % (ref 11.5–15.5)
WBC: 12.4 10*3/uL — ABNORMAL HIGH (ref 4.0–10.5)
nRBC: 0 % (ref 0.0–0.2)

## 2020-08-26 LAB — COMPREHENSIVE METABOLIC PANEL
ALT: 17 U/L (ref 0–44)
AST: 22 U/L (ref 15–41)
Albumin: 3.4 g/dL — ABNORMAL LOW (ref 3.5–5.0)
Alkaline Phosphatase: 46 U/L (ref 38–126)
Anion gap: 11 (ref 5–15)
BUN: 27 mg/dL — ABNORMAL HIGH (ref 8–23)
CO2: 28 mmol/L (ref 22–32)
Calcium: 9.2 mg/dL (ref 8.9–10.3)
Chloride: 99 mmol/L (ref 98–111)
Creatinine, Ser: 0.98 mg/dL (ref 0.44–1.00)
GFR, Estimated: 57 mL/min — ABNORMAL LOW (ref >60)
Glucose, Bld: 127 mg/dL — ABNORMAL HIGH (ref 70–99)
Potassium: 4.5 mmol/L (ref 3.5–5.1)
Sodium: 138 mmol/L (ref 135–145)
Total Bilirubin: 1.2 mg/dL (ref 0.3–1.2)
Total Protein: 6.6 g/dL (ref 6.5–8.1)

## 2020-08-26 LAB — PHOSPHORUS: Phosphorus: 3.2 mg/dL (ref 2.5–4.6)

## 2020-08-26 LAB — APTT: aPTT: 29 seconds (ref 24–36)

## 2020-08-26 LAB — SARS CORONAVIRUS 2 (TAT 6-24 HRS): SARS Coronavirus 2: NEGATIVE

## 2020-08-26 LAB — PROTIME-INR
INR: 1.3 — ABNORMAL HIGH (ref 0.8–1.2)
Prothrombin Time: 16.4 seconds — ABNORMAL HIGH (ref 11.4–15.2)

## 2020-08-26 LAB — MAGNESIUM: Magnesium: 1.7 mg/dL (ref 1.7–2.4)

## 2020-08-26 MED ORDER — ASPIRIN EC 81 MG PO TBEC: 81.0000 mg | DELAYED_RELEASE_TABLET | Freq: Every day | ORAL | Status: DC

## 2020-08-26 MED ORDER — OXYCODONE HCL 5 MG PO TABS
5.0000 mg | ORAL_TABLET | Freq: Four times a day (QID) | ORAL | Status: DC | PRN
  Administered 2020-08-26: 5 mg via ORAL
  Filled 2020-08-26: qty 1

## 2020-08-26 MED ORDER — TRANEXAMIC ACID-NACL 1000-0.7 MG/100ML-% IV SOLN
1000.0000 mg | INTRAVENOUS | Status: AC
  Administered 2020-08-27: 1000 mg via INTRAVENOUS
  Filled 2020-08-26: qty 100

## 2020-08-26 MED ORDER — DICLOFENAC SODIUM 1 % TD GEL
2.0000 g | Freq: Four times a day (QID) | TRANSDERMAL | Status: DC
  Administered 2020-08-26 – 2020-09-01 (×3): 2 g via TOPICAL
  Filled 2020-08-26: qty 100

## 2020-08-26 MED ORDER — MORPHINE SULFATE (PF) 2 MG/ML IV SOLN
2.0000 mg | INTRAVENOUS | Status: DC | PRN
  Administered 2020-08-26 (×2): 2 mg via INTRAVENOUS
  Filled 2020-08-26 (×2): qty 1

## 2020-08-26 MED ORDER — HYDROMORPHONE HCL 1 MG/ML IJ SOLN
0.5000 mg | INTRAMUSCULAR | Status: DC | PRN
  Administered 2020-08-26 – 2020-08-29 (×6): 0.5 mg via INTRAVENOUS
  Filled 2020-08-26 (×6): qty 0.5

## 2020-08-26 MED ORDER — MAGNESIUM SULFATE 2 GM/50ML IV SOLN
2.0000 g | Freq: Once | INTRAVENOUS | Status: AC
  Administered 2020-08-26: 2 g via INTRAVENOUS
  Filled 2020-08-26: qty 50

## 2020-08-26 MED ORDER — DULOXETINE HCL 30 MG PO CPEP
30.0000 mg | ORAL_CAPSULE | Freq: Every day | ORAL | Status: DC
  Administered 2020-08-26 – 2020-09-01 (×6): 30 mg via ORAL
  Filled 2020-08-26 (×6): qty 1

## 2020-08-26 MED ORDER — CALCIUM CARBONATE-VITAMIN D 500-200 MG-UNIT PO TABS
1.0000 | ORAL_TABLET | Freq: Every day | ORAL | Status: DC
  Administered 2020-08-26 – 2020-09-01 (×7): 1 via ORAL
  Filled 2020-08-26 (×7): qty 1

## 2020-08-26 MED ORDER — OXYCODONE HCL 5 MG PO TABS
7.5000 mg | ORAL_TABLET | ORAL | Status: DC | PRN
  Administered 2020-08-26: 7.5 mg via ORAL
  Filled 2020-08-26: qty 2

## 2020-08-26 MED ORDER — HEPARIN SODIUM (PORCINE) 5000 UNIT/ML IJ SOLN
5000.0000 [IU] | Freq: Three times a day (TID) | INTRAMUSCULAR | Status: DC
  Administered 2020-08-28 – 2020-08-30 (×7): 5000 [IU] via SUBCUTANEOUS
  Filled 2020-08-26 (×7): qty 1

## 2020-08-26 MED ORDER — SODIUM CHLORIDE 0.9 % IV SOLN: INTRAVENOUS | Status: DC

## 2020-08-26 MED ORDER — CEFAZOLIN SODIUM-DEXTROSE 2-4 GM/100ML-% IV SOLN
2.0000 g | INTRAVENOUS | Status: AC
  Administered 2020-08-27: 2 g via INTRAVENOUS

## 2020-08-26 MED ORDER — OXYCODONE HCL 5 MG PO TABS
7.5000 mg | ORAL_TABLET | Freq: Four times a day (QID) | ORAL | Status: DC | PRN
  Administered 2020-08-30 – 2020-09-01 (×3): 7.5 mg via ORAL
  Filled 2020-08-26 (×4): qty 2

## 2020-08-26 MED ORDER — ALPRAZOLAM 0.25 MG PO TABS
0.2500 mg | ORAL_TABLET | Freq: Two times a day (BID) | ORAL | Status: DC | PRN
  Administered 2020-08-26 – 2020-08-31 (×4): 0.25 mg via ORAL
  Filled 2020-08-26 (×4): qty 1

## 2020-08-26 MED ORDER — ACETAMINOPHEN 325 MG PO TABS
650.0000 mg | ORAL_TABLET | Freq: Four times a day (QID) | ORAL | Status: DC
  Administered 2020-08-26 – 2020-09-01 (×16): 650 mg via ORAL
  Filled 2020-08-26 (×17): qty 2

## 2020-08-26 MED ORDER — DULOXETINE HCL 60 MG PO CPEP
60.0000 mg | ORAL_CAPSULE | Freq: Every day | ORAL | Status: DC
  Administered 2020-08-28 – 2020-09-01 (×5): 60 mg via ORAL
  Filled 2020-08-26 (×6): qty 1

## 2020-08-26 MED ORDER — GABAPENTIN 400 MG PO CAPS
800.0000 mg | ORAL_CAPSULE | Freq: Three times a day (TID) | ORAL | Status: DC
  Administered 2020-08-26 – 2020-09-01 (×16): 800 mg via ORAL
  Filled 2020-08-26 (×13): qty 2
  Filled 2020-08-26: qty 8
  Filled 2020-08-26 (×2): qty 2

## 2020-08-26 NOTE — Anesthesia Preprocedure Evaluation (Addendum)
Anesthesia Evaluation  Patient identified by MRN, date of birth, ID band Patient awake and Patient confused    Reviewed: Allergy & Precautions, NPO status , Patient's Chart, lab work & pertinent test results, reviewed documented beta blocker date and time   History of Anesthesia Complications Negative for: history of anesthetic complications  Airway Mallampati: II  TM Distance: >3 FB Neck ROM: Full    Dental  (+) Edentulous Upper, Edentulous Lower   Pulmonary shortness of breath and with exertion, sleep apnea ,           Cardiovascular Exercise Tolerance: Poor hypertension, Pt. on medications and Pt. on home beta blockers + CAD, + Peripheral Vascular Disease ([name] 4.2 cm in 2020) and +CHF  + dysrhythmias Atrial Fibrillation  Rhythm:Irregular Rate:Tachycardia  25-Aug-2020 16:11:33 [facility]-AP-ER ROUTINE RECORD 28-Jun-1933 (38 yr) Female Caucasian Room:ER7 Loc:499 Technician: LL Test ind: Comment 1:: Comment 2:: Comment 3:: Comment 4:: Vent. rate 116 BPM PR interval * ms QRS duration 88 ms QT/QTcB 329/457 ms P-R-T axes * 61 78 Atrial fibrillation Low voltage, extremity and precordial leads Minimal ST depression, lateral leads Baseline wander in lead(s) II V3   Neuro/Psych PSYCHIATRIC DISORDERS Depression Dementia [name] Neuromuscular disease CVA    GI/Hepatic GERD  Medicated,  Endo/Other  diabetes (pre), Well Controlled, Type 2  Renal/GU Renal disease     Musculoskeletal  (+) Arthritis ,   Abdominal   Peds  Hematology   Anesthesia Other Findings 1. Left ventricular ejection fraction, by estimation, is 60 to 65%. The  left ventricle has normal function. The left ventricle has no regional  wall motion abnormalities. There is severe left ventricular hypertrophy.  Left ventricular diastolic function  could not be evaluated. Left ventricular diastolic function could not be  evaluated.  2. Right  ventricular systolic function is low normal. The right  ventricular size is normal. There is mildly elevated pulmonary artery  systolic pressure.  3. The mitral valve is abnormal. Trivial mitral valve regurgitation.  4. The aortic valve is tricuspid. Aortic valve regurgitation is not  visualized. Mild aortic valve sclerosis is present, with no evidence of  aortic valve stenosis.  5. Aortic dilatation noted. There is mild dilatation of the aortic root  and of the ascending aorta measuring 39 mm.  6. The inferior vena cava is dilated in size with <50% respiratory  variability, suggesting right atrial pressure of 15 mmHg.   Conclusion(s)/Recommendation(s): Findings consistent with Severe LVH and  diastolic dysfunction, consider outpatient amyloidosis workup.   Reproductive/Obstetrics                            Anesthesia Physical Anesthesia Plan  ASA: IV  Anesthesia Plan: General   Post-op Pain Management:    Induction: Intravenous  PONV Risk Score and Plan: Ondansetron and Dexamethasone  Airway Management Planned: Oral ETT  Additional Equipment:   Intra-op Plan:   Post-operative Plan: Extubation in OR  Informed Consent: I have reviewed the patients History and Physical, chart, labs and discussed the procedure including the risks, benefits and alternatives for the proposed anesthesia with the patient or authorized representative who has indicated his/her understanding and acceptance.       Plan Discussed with: CRNA and Surgeon  Anesthesia Plan Comments:        Anesthesia Quick Evaluation

## 2020-08-26 NOTE — Progress Notes (Signed)
PT Cancellation Note  Patient Details Name: Shreeya Recendiz MRN: [id number] DOB: Sep 06, 1933   Cancelled Treatment:    Reason Eval/Treat Not Completed: Medical issues which prohibited therapy.  Will await new orders from Ortho MD after surgery.   4:02 PM, 08/26/20 Lonell Grandchild, MPT Physical Therapist with [facility] [phone] office [phone] mobile phone

## 2020-08-26 NOTE — Consult Note (Signed)
ORTHOPAEDIC CONSULTATION  REQUESTING PHYSICIAN: Murlean Iba, MD  ASSESSMENT AND PLAN: 87 y.o. female with the following: Left Hip Displaced femoral neck fracture  This patient requires inpatient admission to the hospitalist, to include preoperative clearance and perioperative medical management  - Weight Bearing Status/Activity: NWB Left lower extremity  - Additional recommended labs/tests: Preop Labs: CBC, BMP, PT/INR, Chest XR and EKG   - Recommend gentle hydration in preparation for surgery. Surgery scheduled for 08/27/20  -VTE Prophylaxis: Please hold prior to OR; to resume POD#1 at the discretion of the primary team  - Pain control: Recommend PO pain medications PRN; judicious use of narcotics  - Follow-up plan: F/u 10-14 days postop  -Procedures: Plan for OR once patient has been medically optimized  Plan for Left Hip hemiarthroplasty, 08/27/20  Please keep patient NPO passed midnight tonight.   She has multiple medical comorbidities, but the benefits of surgery outweigh the risks.  She will be evaluated by anesthesia prior to proceeding with surgery.   Risks and benefits of surgery, including, but not limited to infection, bleeding, persistent pain, damage to surrounding structures, need for further surgery, dislocation and more severe complications associated with anesthesia were discussed.  All questions have been answered and they have elected to proceed with surgery.       Chief Complaint: Left hip pain  HPI: [name] is a 87 y.o. female who presented to the ED for evaluation after sustaining a mechanical fall.  She lives with her sons, and got up in the middle of the night to use the restroom and fell.  She landed on her left side.  Her son helped her back into bed.  In the morning, a home health aide evaluated the patient and she was unable to ambulate.  EMS was called and brought her to the ED.  She is complaining of left hip pain that worsens with  movement.  She is comfortable when she is laying still.  She walks with a rolling walker at baseline.  She did not hit her head.  No pain elsewhere.   Past Medical History:  Diagnosis Date  . [name] (abdominal aortic aneurysm) ([place]) 09/2013   3.6 cm  . Acute and chronic respiratory failure with hypoxia ([place])   . Acute delirium   . Acute encephalopathy   . Acute pyelonephritis   . Atrial fibrillation ([place])   . Chronic pain syndrome   . Constipation   . Dementia ([place])   . Dependent edema    bilateral legs   . Depression   . Diabetes mellitus    pre-diabetes  . Diverticulosis   . Diverticulosis   . Dysuria   . E coli bacteremia   . Fall   . Gross hematuria   . Hydronephrosis with renal and ureteral calculous obstruction   . Hyperlipidemia   . Hypertension   . Hypoxemia   . Idiopathic peripheral autonomic neuropathy   . Infestation by bed bug   . Nephrolithiasis   . OSA on CPAP   . Osteoarthritis   . Osteopenia    T=  -2.1 in hip  . Persistent mood (affective) disorder, unspecified ([place])   . Prediabetes   . Rheumatoid arthritis ([place])   . Rheumatoid arthritis(714.0)   . S/P carpal tunnel release   . Sepsis secondary to UTI ([place])   . Spinal stenosis   . Unspecified fracture of fifth lumbar vertebra, initial encounter for closed fracture ([place])   . Unspecified fracture of t11-T12  vertebra, initial encounter for closed fracture ([place])   . Urinary incontinence   . Urinary tract infection    hx of   . Urolithiasis    Past Surgical History:  Procedure Laterality Date  . APPENDECTOMY    . CARPAL TUNNEL RELEASE  12/27/2006   Right subcutaneous ulnar nerve transfer -- Right open carpal tunnel release  . CHOLECYSTECTOMY    . CYSTOSCOPY W/ URETERAL STENT PLACEMENT Right 01/30/2018   Procedure: CYSTOSCOPY WITH STENT REPLACEMENT retrograde pylegram;  Surgeon: Ardis Hughs, MD;  Location: [facility];  Service: Urology;  Laterality: Right;  . CYSTOSCOPY WITH URETEROSCOPY AND STENT  PLACEMENT Right 03/01/2018   Procedure: CYSTOSCPY/RIGHT URETEROSCOPY HOLMIUM LASER LITHOTRIPSY AND STENT EXCHANGE;  Surgeon: Ardis Hughs, MD;  Location: [facility];  Service: Urology;  Laterality: Right;  . FOOT SURGERY    . HOLMIUM LASER APPLICATION Right 37/34/2876   Procedure: HOLMIUM LASER APPLICATION;  Surgeon: Ardis Hughs, MD;  Location: [facility];  Service: Urology;  Laterality: Right;  . JOINT REPLACEMENT     BTKR, RTHR  . TOTAL KNEE ARTHROPLASTY  08/21/2007   Right total knee replacement   Social History   Socioeconomic History  . Marital status: Divorced    Spouse name: Not on file  . Number of children: 6  . Years of education: 8th grade  . Highest education level: Not on file  Occupational History  . Occupation: Retired  Tobacco Use  . Smoking status: Never Smoker  . Smokeless tobacco: Never Used  Vaping Use  . Vaping Use: Never used  Substance and Sexual Activity  . Alcohol use: No  . Drug use: No  . Sexual activity: Not on file  Other Topics Concern  . Not on file  Social History Narrative   Lives at home with 2 sons   She has four living children.   Right-handed.   Two cups caffeine per day.   Social Determinants of Health   Financial Resource Strain: Not on file  Food Insecurity: Not on file  Transportation Needs: Not on file  Physical Activity: Not on file  Stress: Not on file  Social Connections: Not on file   Family History  Problem Relation Age of Onset  . Stroke Father   . Coronary artery disease Son        CABG at age 67  . Heart disease Son   . Heart attack Son   . Stroke Mother   . Hypertension Sister   . Diabetes Brother   . Hypertension Maternal Aunt    Allergies  Allergen Reactions  . Sulfonamide Derivatives Hives and Rash   Prior to Admission medications   Medication Sig Start Date End Date Taking? Authorizing Provider  acetaminophen (TYLENOL) 500 MG tablet Take 500 mg by mouth every 8 (eight) hours as needed for  mild pain.    Yes [provider]  ammonium lactate (AMLACTIN) 12 % cream Apply topically as needed for dry skin. 08/17/18  Yes Trula Slade, DPM  aspirin EC 81 MG tablet Take 81 mg by mouth daily.   Yes [provider]  atenolol (TENORMIN) 50 MG tablet Take 1 tablet (50 mg total) by mouth daily. 05/19/20  Yes [name], MD  Calcium Carbonate-Vitamin D3 600-400 MG-UNIT TABS Take 1 tablet by mouth daily. 02/04/20  Yes [provider]  diclofenac sodium (VOLTAREN) 1 % GEL Apply 2 g topically 4 (four) times daily. Rub into affected area of foot 2 to 4  times daily 08/17/18  Yes Trula Slade, DPM  DULoxetine (CYMBALTA) 30 MG capsule Take 1 capsule (30 mg total) by mouth daily. Takes along with 36m for total daily dose of 980m 01/07/20  Yes PiSusy FrizzleMD  DULoxetine (CYMBALTA) 60 MG capsule Take 1 capsule (60 mg total) by mouth daily. Take along with 3058mor a total daily dose of 82m27m/7/21  Yes PickSusy Frizzle  gabapentin (NEURONTIN) 800 MG tablet Take 1 tablet (800 mg total) by mouth 3 (three) times daily. 08/17/20  Yes Yan,Marcial Pacas  mupirocin ointment (BACTROBAN) 2 % APPLY TO WOUND TWICE DAILY 05/22/20  Yes PricEvelina BucyM  nystatin cream (MYCOSTATIN) Apply 1 application topically 2 (two) times daily. 04/07/20  Yes [place], KawaModena Nunnery  oxyCODONE-acetaminophen (PERCOCET) 7.5-325 MG tablet Take 1 tablet by mouth every 8 (eight) hours as needed for severe pain. 07/07/20  Yes PickSusy Frizzle  pantoprazole (PROTONIX) 40 MG tablet TAKE 1 TABLET BY MOUTH ONCE DAILY 07/21/20  Yes PickSusy Frizzle  rosuvastatin (CRESTOR) 40 MG tablet TAKE 1 TABLET BY MOUTH EACH EVENING 08/20/20  Yes PickSusy Frizzle  carbamide peroxide (DEBROX) 6.5 % OTIC solution Place 5 drops into both ears 2 (two) times daily. Patient not taking: Reported on 08/26/2020 07/07/20   PickSusy Frizzle  ketoconazole (NIZORAL) 2 % cream APPLY 1 APPLICATION TOPICALLY  DAILY Patient not taking: Reported on 08/26/2020 05/22/20   PricEvelina BucyM   CT Head Wo Contrast  Result Date: 08/25/2020 CLINICAL DATA:  Un witnessed fall last night unsure of head injury EXAM: CT HEAD WITHOUT CONTRAST CT CERVICAL SPINE WITHOUT CONTRAST TECHNIQUE: Multidetector CT imaging of the head and cervical spine was performed following the standard protocol without intravenous contrast. Multiplanar CT image reconstructions of the cervical spine were also generated. COMPARISON:  March 27, 2018 and February 23, 2018. FINDINGS: CT HEAD FINDINGS Brain: No evidence of acute large vascular territory infarction, hemorrhage, extra-axial collection or mass lesion/mass effect. Stable global parenchymal volume loss with ex vacuo dilatation of ventricular system. Similar severe burden chronic microvascular ischemic white matter disease. Chronic low type infarcts in the right pons and bilateral basal ganglia. Vascular: No hyperdense vessel. Dense atherosclerotic calcifications of the skull base vessels with involvement of the bilateral MCAs. Skull: Normal. Negative for fracture or focal lesion. Sinuses/Orbits: The paranasal sinuses and mastoid air cells are predominantly clear. Orbits are grossly unremarkable. Other: None CT CERVICAL SPINE FINDINGS Alignment: Normal with preservation of the cervical lordosis. Degenerative anterolisthesis of C5 on C6. Skull base and vertebrae: No acute fracture. No primary bone lesion or focal pathologic process. Soft tissues and spinal canal: No prevertebral fluid or swelling. No visible canal hematoma. Mineralized pannus posterior to the dens. Disc levels: Multilevel degenerative changes cervical spine including disc space narrowing, disc osteophyte complex and uncovertebral/facet hypertrophy most notable at C6-C7 Upper chest: None Other: Dense carotid artery calcifications. IMPRESSION: 1. No evidence of acute intracranial pathology. 2. Similar global parenchymal volume loss  and sequela of microvascular ischemic disease. 3. No evidence of acute displaced fracture or subluxation of the cervical spine. 4. Multilevel degenerative changes of the cervical spine. 5. Dense carotid artery calcifications. Electronically Signed   By: JeffDahlia Bailiff  On: 08/25/2020 17:36   CT Cervical Spine Wo Contrast  Result Date: 08/25/2020 CLINICAL DATA:  Un witnessed fall last night unsure of head injury EXAM: CT HEAD WITHOUT CONTRAST CT CERVICAL SPINE WITHOUT  CONTRAST TECHNIQUE: Multidetector CT imaging of the head and cervical spine was performed following the standard protocol without intravenous contrast. Multiplanar CT image reconstructions of the cervical spine were also generated. COMPARISON:  March 27, 2018 and February 23, 2018. FINDINGS: CT HEAD FINDINGS Brain: No evidence of acute large vascular territory infarction, hemorrhage, extra-axial collection or mass lesion/mass effect. Stable global parenchymal volume loss with ex vacuo dilatation of ventricular system. Similar severe burden chronic microvascular ischemic white matter disease. Chronic low type infarcts in the right pons and bilateral basal ganglia. Vascular: No hyperdense vessel. Dense atherosclerotic calcifications of the skull base vessels with involvement of the bilateral MCAs. Skull: Normal. Negative for fracture or focal lesion. Sinuses/Orbits: The paranasal sinuses and mastoid air cells are predominantly clear. Orbits are grossly unremarkable. Other: None CT CERVICAL SPINE FINDINGS Alignment: Normal with preservation of the cervical lordosis. Degenerative anterolisthesis of C5 on C6. Skull base and vertebrae: No acute fracture. No primary bone lesion or focal pathologic process. Soft tissues and spinal canal: No prevertebral fluid or swelling. No visible canal hematoma. Mineralized pannus posterior to the dens. Disc levels: Multilevel degenerative changes cervical spine including disc space narrowing, disc osteophyte  complex and uncovertebral/facet hypertrophy most notable at C6-C7 Upper chest: None Other: Dense carotid artery calcifications. IMPRESSION: 1. No evidence of acute intracranial pathology. 2. Similar global parenchymal volume loss and sequela of microvascular ischemic disease. 3. No evidence of acute displaced fracture or subluxation of the cervical spine. 4. Multilevel degenerative changes of the cervical spine. 5. Dense carotid artery calcifications. Electronically Signed   By: Dahlia Bailiff MD   On: 08/25/2020 17:36   DG Chest Port 1 View  Result Date: 08/25/2020 CLINICAL DATA:  Fall EXAM: PORTABLE CHEST 1 VIEW COMPARISON:  May 21, 2020. FINDINGS: Unchanged cardiac enlargement. Tortuous calcified thoracic aorta. No focal consolidation. No significant pleural effusion or visible pneumothorax. Chronic thoracolumbar junction compression deformity. IMPRESSION: Stable exam, no acute pulmonary findings. Electronically Signed   By: Dahlia Bailiff MD   On: 08/25/2020 17:38   DG Hip Unilat W or Wo Pelvis 2-3 Views Left  Result Date: 08/25/2020 CLINICAL DATA:  Left hip pain after fall. EXAM: DG HIP (WITH OR WITHOUT PELVIS) 2-3V LEFT COMPARISON:  July 03, 2017. FINDINGS: Mildly displaced and foreshortened left subcapital hip fracture. The femoral head appears well-seated within the acetabulum. Pelvic phleboliths. Vascular calcifications. IMPRESSION: Mildly displaced and foreshortened subcapital left hip fracture. Electronically Signed   By: Dahlia Bailiff MD   On: 08/25/2020 17:24   Family History Reviewed and non-contributory, no pertinent history of problems with bleeding or anesthesia    Review of Systems No fevers or chills No chest pain No shortness of breath No bowel or bladder dysfunction No GI distress No headaches    OBJECTIVE  Vitals: Patient Vitals for the past 8 hrs:  BP Temp Pulse Resp SpO2  08/26/20 0440 130/60 (!) 97.5 F (36.4 C) 93 19 (!) 86 %   General: Alert, in obvious  discomfort Cardiovascular: Extremities are warm Respiratory: No cyanosis, no use of accessory musculature Skin: No lesions in the area of chief complaint  Neurologic: Decreased sensation to the left foot due to neuropathy  Psychiatric: Patient is alert and answers basic questions.   Lymphatic: No swelling obvious and reported other than the area involved in the exam below Extremities  LLE: Extremity held in a fixed position.  ROM deferred due to known fracture.  Sensation is globally decreased. 2+ DP pulse.  Toes are WWP.  Active motion intact in the TA/EHL/GS. RLE: Sensation is globally decreased. 2+ DP pulse.  Toes are WWP.  Active motion intact in the TA/EHL/GS. Tolerates gentle ROM of the hip.  No pain with axial loading.     Test Results Imaging XR of the Left hip demonstrates a Displaced femoral neck fracture.  Labs cbc Recent Labs    08/25/20 1610 08/26/20 0459  WBC 11.3* 12.4*  HGB 13.1 12.3  HCT 43.0 40.3  PLT 169 148*    Labs inflam No results for input(s): CRP in the last 72 hours.  Invalid input(s): ESR  Labs coag Recent Labs    08/26/20 0459  INR 1.3*    Recent Labs    08/25/20 1610 08/26/20 0459  NA 134* 138  K 4.6 4.5  CL 98 99  CO2 28 28  GLUCOSE 141* 127*  BUN 23 27*  CREATININE 0.96 0.98  CALCIUM 8.8* 9.2

## 2020-08-26 NOTE — Plan of Care (Signed)
  Problem: Clinical Measurements: Goal: Ability to maintain clinical measurements within normal limits will improve Outcome: Progressing Goal: Will remain free from infection Outcome: Progressing Goal: Respiratory complications will improve Outcome: Progressing Goal: Cardiovascular complication will be avoided Outcome: Progressing   Problem: Activity: Goal: Risk for activity intolerance will decrease Outcome: Progressing   

## 2020-08-26 NOTE — Consult Note (Signed)
Cardiology Consultation:   Patient ID: [name] MRN: [id number]; DOB: March 26, 1934  Admit date: 08/25/2020 Date of Consult: 08/26/2020  PCP:  [name], MD   [facility]  Cardiologist:  Kirk Ruths, MD  Advanced Practice Provider:  No care team member to display Electrophysiologist:  None [phone]   Chief complaint: Fall at home Reason for consult: Risk stratification prior to non-cardiac surgery   History of Present Illness:   [name] is an 87 yo female with a hx of permanent AFIB (not on anticoagulation due to multiple falls, currently on ASA), chronic diastolic CHF, CVA (32/6712 - due to basilar artery stenosis), [name] (4.2 cm in 2020), coronary artery calcification on CT, essential hypertension, HL, pre-diabetes, OSA, GERD, gait instability, and arthritis, who was admitted to the hospital after a fall at home. She sustained a ground level fall and since then has had left hip pan. Unable to ambulate.   She denied any chest pain, resting dyspnea, palpitations, pre-syncope or syncope.  In the ED she was noted to be tachycardiac (HR 110s). Resting O2 sat 88%. Placed on 2L oxygen via [place]. Left hip x-ray showed mildly displaced and foreshortened subcapital left hip fracture. CT head - no evidence of acute intracranial pathology, global parenchymal volume loss and sequela of microvascular ischemic disease.  The ECG from 08/25/20 at 16:11 (that I reviewed personally) showed atrial fibrillation, rate 116 bpm, low voltage QRS and non-specific T wave changes laterally. CXR was unremarkable.   Labs were: Na 138, K 4.5, Gluc 127, BUN 27, Cr 0.98, BNP 133, WBC 11.3, Hgb 13.1, Plt 169 and INR 1.3.   Past Medical History:  Diagnosis Date  . [name] (abdominal aortic aneurysm) ([place]) 09/2013   3.6 cm  . Acute and chronic respiratory failure with hypoxia ([place])   . Acute delirium   . Acute encephalopathy   . Acute pyelonephritis   . Atrial fibrillation ([place])   .  Chronic pain syndrome   . Constipation   . Dementia ([place])   . Dependent edema    bilateral legs   . Depression   . Diabetes mellitus    pre-diabetes  . Diverticulosis   . Diverticulosis   . Dysuria   . E coli bacteremia   . Fall   . Gross hematuria   . Hydronephrosis with renal and ureteral calculous obstruction   . Hyperlipidemia   . Hypertension   . Hypoxemia   . Idiopathic peripheral autonomic neuropathy   . Infestation by bed bug   . Nephrolithiasis   . OSA on CPAP   . Osteoarthritis   . Osteopenia    T=  -2.1 in hip  . Persistent mood (affective) disorder, unspecified ([place])   . Prediabetes   . Rheumatoid arthritis ([place])   . Rheumatoid arthritis(714.0)   . S/P carpal tunnel release   . Sepsis secondary to UTI ([place])   . Spinal stenosis   . Unspecified fracture of fifth lumbar vertebra, initial encounter for closed fracture ([place])   . Unspecified fracture of t11-T12 vertebra, initial encounter for closed fracture ([place])   . Urinary incontinence   . Urinary tract infection    hx of   . Urolithiasis     Past Surgical History:  Procedure Laterality Date  . APPENDECTOMY    . CARPAL TUNNEL RELEASE  12/27/2006   Right subcutaneous ulnar nerve transfer -- Right open carpal tunnel release  . CHOLECYSTECTOMY    . [place]  Right 01/30/2018   Procedure: CYSTOSCOPY WITH STENT REPLACEMENT retrograde pylegram;  Surgeon: Ardis Hughs, MD;  Location: [facility];  Service: Urology;  Laterality: Right;  . CYSTOSCOPY WITH URETEROSCOPY AND STENT PLACEMENT Right 03/01/2018   Procedure: CYSTOSCPY/RIGHT URETEROSCOPY HOLMIUM LASER LITHOTRIPSY AND STENT EXCHANGE;  Surgeon: Ardis Hughs, MD;  Location: [facility];  Service: Urology;  Laterality: Right;  . FOOT SURGERY    . HOLMIUM LASER APPLICATION Right 70/17/7939   Procedure: HOLMIUM LASER APPLICATION;  Surgeon: Ardis Hughs, MD;  Location: [facility];  Service: Urology;  Laterality: Right;  . JOINT  REPLACEMENT     BTKR, RTHR  . TOTAL KNEE ARTHROPLASTY  08/21/2007   Right total knee replacement     Home Medications:  Prior to Admission medications   Medication Sig Start Date End Date Taking? Authorizing Provider  acetaminophen (TYLENOL) 500 MG tablet Take 500 mg by mouth every 8 (eight) hours as needed for mild pain.    Yes [provider]  ammonium lactate (AMLACTIN) 12 % cream Apply topically as needed for dry skin. 08/17/18  Yes Trula Slade, DPM  aspirin EC 81 MG tablet Take 81 mg by mouth daily.   Yes [provider]  atenolol (TENORMIN) 50 MG tablet Take 1 tablet (50 mg total) by mouth daily. 05/19/20  Yes [name], MD  Calcium Carbonate-Vitamin D3 600-400 MG-UNIT TABS Take 1 tablet by mouth daily. 02/04/20  Yes [provider]  diclofenac sodium (VOLTAREN) 1 % GEL Apply 2 g topically 4 (four) times daily. Rub into affected area of foot 2 to 4 times daily 08/17/18  Yes Trula Slade, DPM  DULoxetine (CYMBALTA) 30 MG capsule Take 1 capsule (30 mg total) by mouth daily. Takes along with 60mg  for total daily dose of 90mg . 01/07/20  Yes Pickard, [name], MD  DULoxetine (CYMBALTA) 60 MG capsule Take 1 capsule (60 mg total) by mouth daily. Take along with 30mg  for a total daily dose of 90mg . 01/07/20  Yes Pickard, [name], MD  gabapentin (NEURONTIN) 800 MG tablet Take 1 tablet (800 mg total) by mouth 3 (three) times daily. 08/17/20  Yes Marcial Pacas, MD  mupirocin ointment (BACTROBAN) 2 % APPLY TO WOUND TWICE DAILY 05/22/20  Yes Evelina Bucy, DPM  nystatin cream (MYCOSTATIN) Apply 1 application topically 2 (two) times daily. 04/07/20  Yes , Modena Nunnery, MD  oxyCODONE-acetaminophen (PERCOCET) 7.5-325 MG tablet Take 1 tablet by mouth every 8 (eight) hours as needed for severe pain. 07/07/20  Yes [name], MD  pantoprazole (PROTONIX) 40 MG tablet TAKE 1 TABLET BY MOUTH ONCE DAILY 07/21/20  Yes [name], MD  rosuvastatin (CRESTOR) 40  MG tablet TAKE 1 TABLET BY MOUTH EACH EVENING 08/20/20  Yes [name], MD  carbamide peroxide (DEBROX) 6.5 % OTIC solution Place 5 drops into both ears 2 (two) times daily. Patient not taking: Reported on 08/26/2020 07/07/20   [name], MD  ketoconazole (NIZORAL) 2 % cream APPLY 1 APPLICATION TOPICALLY DAILY Patient not taking: Reported on 08/26/2020 05/22/20   Evelina Bucy, DPM    Inpatient Medications: Scheduled Meds: . acetaminophen  650 mg Oral Q6H  . atenolol  50 mg Oral Daily  . [START ON 08/28/2020] heparin  5,000 Units Subcutaneous Q8H  . pantoprazole  40 mg Oral Daily  . rosuvastatin  40 mg Oral Daily   Continuous Infusions: . sodium chloride 50 mL/hr at 08/26/20 1428  . [START ON 08/27/2020]  ceFAZolin (ANCEF) IV    . [START ON 08/27/2020] tranexamic acid     PRN Meds: morphine injection, oxyCODONE  Allergies:    Allergies  Allergen Reactions  . Sulfonamide Derivatives Hives and Rash    Social History:   Social History   Socioeconomic History  . Marital status: Divorced    Spouse name: Not on file  . Number of children: 6  . Years of education: 8th grade  . Highest education level: Not on file  Occupational History  . Occupation: Retired  Tobacco Use  . Smoking status: Never Smoker  . Smokeless tobacco: Never Used  Vaping Use  . Vaping Use: Never used  Substance and Sexual Activity  . Alcohol use: No  . Drug use: No  . Sexual activity: Not on file  Other Topics Concern  . Not on file  Social History Narrative   Lives at home with 2 sons   She has four living children.   Right-handed.   Two cups caffeine per day.   Social Determinants of Health   Financial Resource Strain: Not on file  Food Insecurity: Not on file  Transportation Needs: Not on file  Physical Activity: Not on file  Stress: Not on file  Social Connections: Not on file  Intimate Partner Violence: Not on file    Family History:    Family History  Problem Relation  Age of Onset  . Stroke Father   . Coronary artery disease Son        CABG at age 71  . Heart disease Son   . Heart attack Son   . Stroke Mother   . Hypertension Sister   . Diabetes Brother   . Hypertension Maternal Aunt      ROS:  Please see the history of present illness.  All other ROS reviewed and negative.     Physical Exam/Data:   Vitals:   08/25/20 2141 08/25/20 2144 08/26/20 0440 08/26/20 1300  BP: 117/80  130/60 132/61  Pulse: 97  93 92  Resp: 20  19 18   Temp: 98 F (36.7 C)  (!) 97.5 F (36.4 C) 97.6 F (36.4 C)  TempSrc:    Oral  SpO2: 98% 98% (!) 86% (!) 89%    Intake/Output Summary (Last 24 hours) at 08/26/2020 1518 Last data filed at 08/26/2020 0900 Gross per 24 hour  Intake 240 ml  Output --  Net 240 ml   Last 3 Weights 07/07/2020 04/07/2020 03/17/2020  Weight (lbs) 175 lb 175 lb 172 lb  Weight (kg) 79.379 kg 79.379 kg 78.019 kg     There is no height or weight on file to calculate BMI.  General:  Well nourished, well developed, moaning, slightly confused HEENT: normal Lymph: no adenopathy Neck: no JVD Endocrine:  No thryomegaly Vascular: No carotid bruits; FA pulses 2+ bilaterally without bruits  Cardiac:  normal S1, S2; RRR; no murmur  Lungs:  clear to auscultation bilaterally, no wheezing, rhonchi or rales  Abd: soft, nontender, no hepatomegaly  Ext: no edema Musculoskeletal: tenderness left hip, palpable pedal pulses Skin: warm and dry  Neuro:  no focal abnormalities noted Psych:  Unable to assess  EKG:  The ECG from 08/25/20 at 16:11 (that I reviewed personally) showed atrial fibrillation, rate 116 bpm, low voltage QRS and non-specific T wave changes laterally.    Telemetry: Not on tele   Laboratory Data:  High Sensitivity Troponin:  No results for input(s): TROPONINIHS in the last 720 hours.   Chemistry  Recent Labs  Lab 08/25/20 1610 08/26/20 0459  NA 134* 138  K 4.6 4.5  CL 98 99  CO2 28 28  GLUCOSE 141* 127*  BUN 23 27*   CREATININE 0.96 0.98  CALCIUM 8.8* 9.2  GFRNONAA 58* 57*  ANIONGAP 8 11    Recent Labs  Lab 08/25/20 1610 08/26/20 0459  PROT 7.1 6.6  ALBUMIN 3.6 3.4*  AST 31 22  ALT 21 17  ALKPHOS 52 46  BILITOT 1.0 1.2   Hematology Recent Labs  Lab 08/25/20 1610 08/26/20 0459  WBC 11.3* 12.4*  RBC 4.45 4.13  HGB 13.1 12.3  HCT 43.0 40.3  MCV 96.6 97.6  MCH 29.4 29.8  MCHC 30.5 30.5  RDW 13.9 13.8  PLT 169 148*   BNP Recent Labs  Lab 08/25/20 1603  BNP 133.0*    DDimer No results for input(s): DDIMER in the last 168 hours.   Radiology/Studies:  CT Head Wo Contrast  Result Date: 08/25/2020 CLINICAL DATA:  Un witnessed fall last night unsure of head injury EXAM: CT HEAD WITHOUT CONTRAST CT CERVICAL SPINE WITHOUT CONTRAST TECHNIQUE: Multidetector CT imaging of the head and cervical spine was performed following the standard protocol without intravenous contrast. Multiplanar CT image reconstructions of the cervical spine were also generated. COMPARISON:  March 27, 2018 and February 23, 2018. FINDINGS: CT HEAD FINDINGS Brain: No evidence of acute large vascular territory infarction, hemorrhage, extra-axial collection or mass lesion/mass effect. Stable global parenchymal volume loss with ex vacuo dilatation of ventricular system. Similar severe burden chronic microvascular ischemic white matter disease. Chronic low type infarcts in the right pons and bilateral basal ganglia. Vascular: No hyperdense vessel. Dense atherosclerotic calcifications of the skull base vessels with involvement of the bilateral MCAs. Skull: Normal. Negative for fracture or focal lesion. Sinuses/Orbits: The paranasal sinuses and mastoid air cells are predominantly clear. Orbits are grossly unremarkable. Other: None CT CERVICAL SPINE FINDINGS Alignment: Normal with preservation of the cervical lordosis. Degenerative anterolisthesis of C5 on C6. Skull base and vertebrae: No acute fracture. No primary bone lesion or  focal pathologic process. Soft tissues and spinal canal: No prevertebral fluid or swelling. No visible canal hematoma. Mineralized pannus posterior to the dens. Disc levels: Multilevel degenerative changes cervical spine including disc space narrowing, disc osteophyte complex and uncovertebral/facet hypertrophy most notable at C6-C7 Upper chest: None Other: Dense carotid artery calcifications. IMPRESSION: 1. No evidence of acute intracranial pathology. 2. Similar global parenchymal volume loss and sequela of microvascular ischemic disease. 3. No evidence of acute displaced fracture or subluxation of the cervical spine. 4. Multilevel degenerative changes of the cervical spine. 5. Dense carotid artery calcifications. Electronically Signed   By: Dahlia Bailiff MD   On: 08/25/2020 17:36   CT Cervical Spine Wo Contrast  Result Date: 08/25/2020 CLINICAL DATA:  Un witnessed fall last night unsure of head injury EXAM: CT HEAD WITHOUT CONTRAST CT CERVICAL SPINE WITHOUT CONTRAST TECHNIQUE: Multidetector CT imaging of the head and cervical spine was performed following the standard protocol without intravenous contrast. Multiplanar CT image reconstructions of the cervical spine were also generated. COMPARISON:  March 27, 2018 and February 23, 2018. FINDINGS: CT HEAD FINDINGS Brain: No evidence of acute large vascular territory infarction, hemorrhage, extra-axial collection or mass lesion/mass effect. Stable global parenchymal volume loss with ex vacuo dilatation of ventricular system. Similar severe burden chronic microvascular ischemic white matter disease. Chronic low type infarcts in the right pons and bilateral basal ganglia. Vascular: No hyperdense vessel. Dense atherosclerotic  calcifications of the skull base vessels with involvement of the bilateral MCAs. Skull: Normal. Negative for fracture or focal lesion. Sinuses/Orbits: The paranasal sinuses and mastoid air cells are predominantly clear. Orbits are grossly  unremarkable. Other: None CT CERVICAL SPINE FINDINGS Alignment: Normal with preservation of the cervical lordosis. Degenerative anterolisthesis of C5 on C6. Skull base and vertebrae: No acute fracture. No primary bone lesion or focal pathologic process. Soft tissues and spinal canal: No prevertebral fluid or swelling. No visible canal hematoma. Mineralized pannus posterior to the dens. Disc levels: Multilevel degenerative changes cervical spine including disc space narrowing, disc osteophyte complex and uncovertebral/facet hypertrophy most notable at C6-C7 Upper chest: None Other: Dense carotid artery calcifications. IMPRESSION: 1. No evidence of acute intracranial pathology. 2. Similar global parenchymal volume loss and sequela of microvascular ischemic disease. 3. No evidence of acute displaced fracture or subluxation of the cervical spine. 4. Multilevel degenerative changes of the cervical spine. 5. Dense carotid artery calcifications. Electronically Signed   By: Dahlia Bailiff MD   On: 08/25/2020 17:36   DG Chest Port 1 View  Result Date: 08/25/2020 CLINICAL DATA:  Fall EXAM: PORTABLE CHEST 1 VIEW COMPARISON:  May 21, 2020. FINDINGS: Unchanged cardiac enlargement. Tortuous calcified thoracic aorta. No focal consolidation. No significant pleural effusion or visible pneumothorax. Chronic thoracolumbar junction compression deformity. IMPRESSION: Stable exam, no acute pulmonary findings. Electronically Signed   By: Dahlia Bailiff MD   On: 08/25/2020 17:38   DG Hip Unilat W or Wo Pelvis 2-3 Views Left  Result Date: 08/25/2020 CLINICAL DATA:  Left hip pain after fall. EXAM: DG HIP (WITH OR WITHOUT PELVIS) 2-3V LEFT COMPARISON:  July 03, 2017. FINDINGS: Mildly displaced and foreshortened left subcapital hip fracture. The femoral head appears well-seated within the acetabulum. Pelvic phleboliths. Vascular calcifications. IMPRESSION: Mildly displaced and foreshortened subcapital left hip fracture.  Electronically Signed   By: Dahlia Bailiff MD   On: 08/25/2020 17:24     Assessment and Plan:   1. Cardiac risk stratification for orthopedic surgery The patient presents to the hospital after a fall at home. She is diagnosed with a left hip fracture and is scheduled for ortho surgery on 08/27/20. Her ECG revealed AFIB with a HR in the 110s (not surprising given her overall discomfort due to left hip pain). No ischemic changes. BNP is mildly elevated (likely chronic finding due to her diastolic dysfunction).  Due to the patient's limited mobility I cannot accurately assess her functional capacity.  She does not exhibit an signs of cardiac ischemia. She is also not volume overloaded on exam. She has required supplemental oxygen (she does have OSA). Last Echo on 07/10/2019 documented LVEF of 60-65%  - No further cardiac testing required prior to her ortho surgery - Monitor on telemetry for tachyarrhythmias - For any episodes of AFIB with RVR peri- or post-surgery, use AV nodal blockers for rate control - Given evidence of severe LVH on echo, would avoid large volume shifts. If IV fluids need to be given then monitor for signs of heart failure. - DVT prophylaxis - Patient not on anticoagulation for AFIB (due to previous falls). Continue ASA.   For questions or updates, please contact [place] Please consult [email or web address] for contact info under    Signed, Meade Maw, MD  08/26/2020 3:18 PM

## 2020-08-26 NOTE — Progress Notes (Signed)
16FR foley placed without difficulty , patient tolerated well, 100cc of amber colored urine returned. Placed on TELE box 3

## 2020-08-26 NOTE — Progress Notes (Signed)
Patient c/o not being able to breathe , screaming out " Please help me and , " Mama", Patients o2 level is 94% with o2 via N. C Pulse is 98, Trying to encourage deep breathing exercises at this time. Patient appears to be more anxious than earlier this shift.

## 2020-08-26 NOTE — Progress Notes (Signed)
Rt inquired about patient use of CPAP at home and she stated she didn't use one and at this time once asked declined. Patient remains on 2lpm [place] with a CPAP unit on standby.

## 2020-08-26 NOTE — Progress Notes (Addendum)
PROGRESS NOTE   [name]  UJW:[id number] DOB: February 14, 1934 DOA: 08/25/2020 PCP: Donita Brooks, MD   Chief Complaint  Patient presents with  . Fall   Level of care: Telemetry  Brief Admission History:   87 y.o. female with medical history significant for atrial fibrillation, hypertension, hyperlipidemia, GERD, gait instability (ambulates with a walker) who presents to the emergency department via EMS due to fall sustained around 2:30 AM today.  Patient states that she got up at night to use the restroom and she came back to bed, shortly after this, she got up again for an unknown reason and sustained a fall.  Son at bedside, helped her back to bed, this morning when the home health aide tried to assist her to go to the bathroom, she was unable to get up due to left hip pain.  EMS was activated and patient was taken to the ED for further evaluation and management. Xrays confirmed left hip fracture.    Assessment & Plan:   Principal Problem:   Closed left hip fracture (HCC) Active Problems:   Hyperlipidemia   Essential hypertension   OSA (obstructive sleep apnea)   Pre-diabetes   Fall at home, initial encounter   Atrial fibrillation, chronic (HCC)   Gait abnormality   Leukocytosis   Hyperglycemia   Elevated brain natriuretic peptide (BNP) level   GERD (gastroesophageal reflux disease)   Acute left subcapital hip fracture due to fall at home - pain management, appreciate ortho consultation, anticipating taking patient to OR for operative repair on 4/28.    Chronic pain with acute exacerbation from recent hip fracture - pain has been difficult to control, DC morphine, dilaudid 0.5 mg every 3 hours, continue oxycodone 7.5 mg every 6 hours, tylenol 650 every 6 hours.    Atrial fibrillation - chronic - HR has been controlled with atenolol.  She is not fully anticoagulated due to frequent falling.  Has been on aspirin.  Cardiology consulted for preoperative evaluation.   GERD -  protonix ordered for GI protection.   OSA - noncompliant with home CPAP use due to damaged equipment.  Outpatient follow up with pulmonary clinic for repeat sleep study, etc.   Hyperlipidemia - well controlled on crestor 20 mg daily.   Leukocytosis - reactive due to acute fracture.  CBC in AM.   Hypoxic respiratory failure - supplemental oxygen 2L min started at admission, wean as able.   DVT prophylaxis: SQ  Heparin, SCD Code Status: Full  Family Communication: son, daughter at bedside  Disposition: anticipating SNF Status is: Inpatient  Remains inpatient appropriate because:IV treatments appropriate due to intensity of illness or inability to take PO and Inpatient level of care appropriate due to severity of illness   Dispo: The patient is from: Home              Anticipated d/c is to: SNF              Patient currently is not medically stable to d/c.   Difficult to place patient No Consultants:   Cardiology  Orthopedics  Procedures:   n/a  Antimicrobials:  n/a   Subjective: Pt c/o of severe pain.   Objective: Vitals:   08/25/20 2144 08/26/20 0440 08/26/20 1300 08/26/20 1710  BP:  130/60 132/61   Pulse:  93 92   Resp:  19 18   Temp:  (!) 97.5 F (36.4 C) 97.6 F (36.4 C)   TempSrc:   Oral   SpO2: 98% Marland Kitchen)  86% (!) 89% 97%    Intake/Output Summary (Last 24 hours) at 08/26/2020 1738 Last data filed at 08/26/2020 1500 Gross per 24 hour  Intake 266.38 ml  Output 300 ml  Net -33.62 ml   There were no vitals filed for this visit.  Examination:  General exam: Appears calm and comfortable  Respiratory system: Clear to auscultation. Respiratory effort normal. Cardiovascular system: normal S1 & S2 heard. No JVD, murmurs, rubs, gallops or clicks. No pedal edema. Gastrointestinal system: Abdomen is nondistended, soft and nontender. No organomegaly or masses felt. Normal bowel sounds heard. Central nervous system: Alert and oriented. No focal neurological  deficits. Extremities: severe left hip pain. Palpable pulses BLEs.  Skin: No rashes, lesions or ulcers Psychiatry: Judgement and insight appear normal. Mood & affect appropriate.   Data Reviewed: I have personally reviewed following labs and imaging studies  CBC: Recent Labs  Lab 08/25/20 1610 08/26/20 0459  WBC 11.3* 12.4*  NEUTROABS 8.6*  --   HGB 13.1 12.3  HCT 43.0 40.3  MCV 96.6 97.6  PLT 169 148*    Basic Metabolic Panel: Recent Labs  Lab 08/25/20 1610 08/26/20 0459  NA 134* 138  K 4.6 4.5  CL 98 99  CO2 28 28  GLUCOSE 141* 127*  BUN 23 27*  CREATININE 0.96 0.98  CALCIUM 8.8* 9.2  MG  --  1.7  PHOS  --  3.2    GFR: CrCl cannot be calculated (Unknown ideal weight.).  Liver Function Tests: Recent Labs  Lab 08/25/20 1610 08/26/20 0459  AST 31 22  ALT 21 17  ALKPHOS 52 46  BILITOT 1.0 1.2  PROT 7.1 6.6  ALBUMIN 3.6 3.4*    CBG: No results for input(s): GLUCAP in the last 168 hours.  Recent Results (from the past 240 hour(s))  SARS CORONAVIRUS 2 (TAT 6-24 HRS) Nasopharyngeal Nasopharyngeal Swab     Status: None   Collection Time: 08/25/20  5:52 PM   Specimen: Nasopharyngeal Swab  Result Value Ref Range Status   SARS Coronavirus 2 NEGATIVE NEGATIVE Final    Comment: (NOTE) SARS-CoV-2 target nucleic acids are NOT DETECTED.  The SARS-CoV-2 RNA is generally detectable in upper and lower respiratory specimens during the acute phase of infection. Negative results do not preclude SARS-CoV-2 infection, do not rule out co-infections with other pathogens, and should not be used as the sole basis for treatment or other patient management decisions. Negative results must be combined with clinical observations, patient history, and epidemiological information. The expected result is Negative.  Fact Sheet for Patients: HairSlick.no  Fact Sheet for Healthcare Providers: quierodirigir.com  This test  is not yet approved or cleared by the [place] FDA and  has been authorized for detection and/or diagnosis of SARS-CoV-2 by FDA under an Emergency Use Authorization (EUA). This EUA will remain  in effect (meaning this test can be used) for the duration of the COVID-19 declaration under Se ction 564(b)(1) of the Act, 21 U.S.C. section 360bbb-3(b)(1), unless the authorization is terminated or revoked sooner.  Performed at [facility], [address] [place], [place] [address]      Radiology Studies: CT Head Wo Contrast  Result Date: 08/25/2020 CLINICAL DATA:  Un witnessed fall last night unsure of head injury EXAM: CT HEAD WITHOUT CONTRAST CT CERVICAL SPINE WITHOUT CONTRAST TECHNIQUE: Multidetector CT imaging of the head and cervical spine was performed following the standard protocol without intravenous contrast. Multiplanar CT image reconstructions of the cervical spine were also  generated. COMPARISON:  March 27, 2018 and February 23, 2018. FINDINGS: CT HEAD FINDINGS Brain: No evidence of acute large vascular territory infarction, hemorrhage, extra-axial collection or mass lesion/mass effect. Stable global parenchymal volume loss with ex vacuo dilatation of ventricular system. Similar severe burden chronic microvascular ischemic white matter disease. Chronic low type infarcts in the right pons and bilateral basal ganglia. Vascular: No hyperdense vessel. Dense atherosclerotic calcifications of the skull base vessels with involvement of the bilateral MCAs. Skull: Normal. Negative for fracture or focal lesion. Sinuses/Orbits: The paranasal sinuses and mastoid air cells are predominantly clear. Orbits are grossly unremarkable. Other: None CT CERVICAL SPINE FINDINGS Alignment: Normal with preservation of the cervical lordosis. Degenerative anterolisthesis of C5 on C6. Skull base and vertebrae: No acute fracture. No primary bone lesion or focal pathologic process. Soft tissues and spinal  canal: No prevertebral fluid or swelling. No visible canal hematoma. Mineralized pannus posterior to the dens. Disc levels: Multilevel degenerative changes cervical spine including disc space narrowing, disc osteophyte complex and uncovertebral/facet hypertrophy most notable at C6-C7 Upper chest: None Other: Dense carotid artery calcifications. IMPRESSION: 1. No evidence of acute intracranial pathology. 2. Similar global parenchymal volume loss and sequela of microvascular ischemic disease. 3. No evidence of acute displaced fracture or subluxation of the cervical spine. 4. Multilevel degenerative changes of the cervical spine. 5. Dense carotid artery calcifications. Electronically Signed   By: Maudry Mayhew MD   On: 08/25/2020 17:36   CT Cervical Spine Wo Contrast  Result Date: 08/25/2020 CLINICAL DATA:  Un witnessed fall last night unsure of head injury EXAM: CT HEAD WITHOUT CONTRAST CT CERVICAL SPINE WITHOUT CONTRAST TECHNIQUE: Multidetector CT imaging of the head and cervical spine was performed following the standard protocol without intravenous contrast. Multiplanar CT image reconstructions of the cervical spine were also generated. COMPARISON:  March 27, 2018 and February 23, 2018. FINDINGS: CT HEAD FINDINGS Brain: No evidence of acute large vascular territory infarction, hemorrhage, extra-axial collection or mass lesion/mass effect. Stable global parenchymal volume loss with ex vacuo dilatation of ventricular system. Similar severe burden chronic microvascular ischemic white matter disease. Chronic low type infarcts in the right pons and bilateral basal ganglia. Vascular: No hyperdense vessel. Dense atherosclerotic calcifications of the skull base vessels with involvement of the bilateral MCAs. Skull: Normal. Negative for fracture or focal lesion. Sinuses/Orbits: The paranasal sinuses and mastoid air cells are predominantly clear. Orbits are grossly unremarkable. Other: None CT CERVICAL SPINE FINDINGS  Alignment: Normal with preservation of the cervical lordosis. Degenerative anterolisthesis of C5 on C6. Skull base and vertebrae: No acute fracture. No primary bone lesion or focal pathologic process. Soft tissues and spinal canal: No prevertebral fluid or swelling. No visible canal hematoma. Mineralized pannus posterior to the dens. Disc levels: Multilevel degenerative changes cervical spine including disc space narrowing, disc osteophyte complex and uncovertebral/facet hypertrophy most notable at C6-C7 Upper chest: None Other: Dense carotid artery calcifications. IMPRESSION: 1. No evidence of acute intracranial pathology. 2. Similar global parenchymal volume loss and sequela of microvascular ischemic disease. 3. No evidence of acute displaced fracture or subluxation of the cervical spine. 4. Multilevel degenerative changes of the cervical spine. 5. Dense carotid artery calcifications. Electronically Signed   By: Maudry Mayhew MD   On: 08/25/2020 17:36   DG Chest Port 1 View  Result Date: 08/25/2020 CLINICAL DATA:  Fall EXAM: PORTABLE CHEST 1 VIEW COMPARISON:  May 21, 2020. FINDINGS: Unchanged cardiac enlargement. Tortuous calcified thoracic aorta. No focal consolidation. No significant pleural effusion  or visible pneumothorax. Chronic thoracolumbar junction compression deformity. IMPRESSION: Stable exam, no acute pulmonary findings. Electronically Signed   By: Maudry Mayhew MD   On: 08/25/2020 17:38   DG Hip Unilat W or Wo Pelvis 2-3 Views Left  Result Date: 08/25/2020 CLINICAL DATA:  Left hip pain after fall. EXAM: DG HIP (WITH OR WITHOUT PELVIS) 2-3V LEFT COMPARISON:  July 03, 2017. FINDINGS: Mildly displaced and foreshortened left subcapital hip fracture. The femoral head appears well-seated within the acetabulum. Pelvic phleboliths. Vascular calcifications. IMPRESSION: Mildly displaced and foreshortened subcapital left hip fracture. Electronically Signed   By: Maudry Mayhew MD   On: 08/25/2020  17:24    Scheduled Meds: . acetaminophen  650 mg Oral Q6H  . [START ON 08/27/2020] aspirin EC  81 mg Oral Daily  . atenolol  50 mg Oral Daily  . calcium-vitamin D  1 tablet Oral Daily  . diclofenac sodium  2 g Topical QID  . DULoxetine  30 mg Oral Daily  . [START ON 08/27/2020] DULoxetine  60 mg Oral Daily  . gabapentin  800 mg Oral TID  . [START ON 08/28/2020] heparin  5,000 Units Subcutaneous Q8H  . pantoprazole  40 mg Oral Daily  . rosuvastatin  40 mg Oral Daily   Continuous Infusions: . sodium chloride 75 mL/hr at 08/26/20 1703  . [START ON 08/27/2020]  ceFAZolin (ANCEF) IV    . magnesium sulfate bolus IVPB    . [START ON 08/27/2020] tranexamic acid       LOS: 1 day   Time spent: 41 mins   Delani Kohli Laural Benes, MD How to contact the [facility] Attending or Consulting provider 7A - 7P or covering provider during after hours 7P -7A, for this patient?  1. Check the care team in [facility] and look for a) attending/consulting [facility] provider listed and b) the [facility] team listed 2. Log into [email or web address] and use [place]'s universal password to access. If you do not have the password, please contact the hospital operator. 3. Locate the [facility] provider you are looking for under [facility] and page to a number that you can be directly reached. 4. If you still have difficulty reaching the provider, please page the [facility] (Director on Call) for the Hospitalists listed on amion for assistance.  08/26/2020, 5:38 PM

## 2020-08-27 ENCOUNTER — Inpatient Hospital Stay (HOSPITAL_COMMUNITY): Payer: Medicare Other

## 2020-08-27 ENCOUNTER — Encounter (HOSPITAL_COMMUNITY)
Admission: EM | Disposition: A | Discharge status: Skilled Nursing Facility | Payer: Self-pay | Source: Home / Self Care | Attending: Family Medicine

## 2020-08-27 ENCOUNTER — Other Ambulatory Visit: Payer: Self-pay

## 2020-08-27 ENCOUNTER — Encounter (HOSPITAL_COMMUNITY): Payer: Self-pay | Admitting: Internal Medicine

## 2020-08-27 ENCOUNTER — Inpatient Hospital Stay (HOSPITAL_COMMUNITY): Payer: Medicare Other | Admitting: Anesthesiology

## 2020-08-27 DIAGNOSIS — Z7189 Other specified counseling: Secondary | ICD-10-CM | POA: Diagnosis not present

## 2020-08-27 DIAGNOSIS — F0390 Unspecified dementia without behavioral disturbance: Secondary | ICD-10-CM | POA: Diagnosis not present

## 2020-08-27 DIAGNOSIS — Z515 Encounter for palliative care: Secondary | ICD-10-CM | POA: Diagnosis not present

## 2020-08-27 DIAGNOSIS — S72002A Fracture of unspecified part of neck of left femur, initial encounter for closed fracture: Secondary | ICD-10-CM | POA: Diagnosis not present

## 2020-08-27 DIAGNOSIS — S72002D Fracture of unspecified part of neck of left femur, subsequent encounter for closed fracture with routine healing: Secondary | ICD-10-CM | POA: Diagnosis not present

## 2020-08-27 HISTORY — PX: HIP ARTHROPLASTY: SHX981

## 2020-08-27 LAB — PROTIME-INR
INR: 1.4 — ABNORMAL HIGH (ref 0.8–1.2)
Prothrombin Time: 16.8 seconds — ABNORMAL HIGH (ref 11.4–15.2)

## 2020-08-27 LAB — SURGICAL PCR SCREEN
MRSA, PCR: NEGATIVE
Staphylococcus aureus: NEGATIVE

## 2020-08-27 LAB — CBC
HCT: 39.5 % (ref 36.0–46.0)
Hemoglobin: 11.7 g/dL — ABNORMAL LOW (ref 12.0–15.0)
MCH: 29 pg (ref 26.0–34.0)
MCHC: 29.6 g/dL — ABNORMAL LOW (ref 30.0–36.0)
MCV: 98 fL (ref 80.0–100.0)
Platelets: 137 10*3/uL — ABNORMAL LOW (ref 150–400)
RBC: 4.03 MIL/uL (ref 3.87–5.11)
RDW: 13.9 % (ref 11.5–15.5)
WBC: 10.4 10*3/uL (ref 4.0–10.5)
nRBC: 0 % (ref 0.0–0.2)

## 2020-08-27 LAB — BASIC METABOLIC PANEL
Anion gap: 8 (ref 5–15)
BUN: 30 mg/dL — ABNORMAL HIGH (ref 8–23)
CO2: 30 mmol/L (ref 22–32)
Calcium: 8.7 mg/dL — ABNORMAL LOW (ref 8.9–10.3)
Chloride: 98 mmol/L (ref 98–111)
Creatinine, Ser: 0.87 mg/dL (ref 0.44–1.00)
GFR, Estimated: >60 mL/min (ref >60)
Glucose, Bld: 101 mg/dL — ABNORMAL HIGH (ref 70–99)
Potassium: 4.4 mmol/L (ref 3.5–5.1)
Sodium: 136 mmol/L (ref 135–145)

## 2020-08-27 LAB — PREPARE RBC (CROSSMATCH)

## 2020-08-27 LAB — MAGNESIUM: Magnesium: 2.2 mg/dL (ref 1.7–2.4)

## 2020-08-27 LAB — GLUCOSE, CAPILLARY: Glucose-Capillary: 128 mg/dL — ABNORMAL HIGH (ref 70–99)

## 2020-08-27 SURGERY — HEMIARTHROPLASTY, HIP, DIRECT ANTERIOR APPROACH, FOR FRACTURE
Anesthesia: General | Site: Hip | Laterality: Left

## 2020-08-27 MED ORDER — METOPROLOL TARTRATE 5 MG/5ML IV SOLN
INTRAVENOUS | Status: AC
  Filled 2020-08-27: qty 5

## 2020-08-27 MED ORDER — 0.9 % SODIUM CHLORIDE (POUR BTL) OPTIME
TOPICAL | Status: DC | PRN
  Administered 2020-08-27: 1000 mL

## 2020-08-27 MED ORDER — ORAL CARE MOUTH RINSE: 15.0000 mL | Freq: Once | OROMUCOSAL | Status: AC

## 2020-08-27 MED ORDER — METOPROLOL TARTRATE 5 MG/5ML IV SOLN
5.0000 mg | Freq: Four times a day (QID) | INTRAVENOUS | Status: AC
  Administered 2020-08-27 – 2020-08-28 (×2): 5 mg via INTRAVENOUS
  Filled 2020-08-27 (×4): qty 5

## 2020-08-27 MED ORDER — PROPOFOL 10 MG/ML IV BOLUS
INTRAVENOUS | Status: DC | PRN
  Administered 2020-08-27: 30 mg via INTRAVENOUS
  Administered 2020-08-27: 20 mg via INTRAVENOUS
  Administered 2020-08-27: 30 mg via INTRAVENOUS

## 2020-08-27 MED ORDER — FENTANYL CITRATE (PF) 100 MCG/2ML IJ SOLN
INTRAMUSCULAR | Status: DC | PRN
  Administered 2020-08-27 (×2): 50 ug via INTRAVENOUS
  Administered 2020-08-27: 100 ug via INTRAVENOUS
  Administered 2020-08-27 (×3): 50 ug via INTRAVENOUS

## 2020-08-27 MED ORDER — VANCOMYCIN HCL 1000 MG IV SOLR
INTRAVENOUS | Status: DC | PRN
  Administered 2020-08-27: 1000 mg

## 2020-08-27 MED ORDER — PHENYLEPHRINE HCL-NACL 10-0.9 MG/250ML-% IV SOLN
INTRAVENOUS | Status: AC
  Filled 2020-08-27: qty 250

## 2020-08-27 MED ORDER — CEFAZOLIN SODIUM-DEXTROSE 2-4 GM/100ML-% IV SOLN
2.0000 g | Freq: Three times a day (TID) | INTRAVENOUS | Status: AC
  Administered 2020-08-27 – 2020-08-28 (×3): 2 g via INTRAVENOUS
  Filled 2020-08-27 (×3): qty 100

## 2020-08-27 MED ORDER — CHLORHEXIDINE GLUCONATE 0.12 % MT SOLN
15.0000 mL | Freq: Once | OROMUCOSAL | Status: AC
  Administered 2020-08-27: 15 mL via OROMUCOSAL

## 2020-08-27 MED ORDER — KETOROLAC TROMETHAMINE 30 MG/ML IJ SOLN
INTRAMUSCULAR | Status: AC
  Filled 2020-08-27: qty 1

## 2020-08-27 MED ORDER — ONDANSETRON HCL 4 MG/2ML IJ SOLN
INTRAMUSCULAR | Status: AC
  Filled 2020-08-27: qty 4

## 2020-08-27 MED ORDER — BUPIVACAINE-EPINEPHRINE (PF) 0.5% -1:200000 IJ SOLN
INTRAMUSCULAR | Status: AC
  Filled 2020-08-27: qty 30

## 2020-08-27 MED ORDER — ROCURONIUM BROMIDE 100 MG/10ML IV SOLN
INTRAVENOUS | Status: DC | PRN
  Administered 2020-08-27: 50 mg via INTRAVENOUS

## 2020-08-27 MED ORDER — CHLORHEXIDINE GLUCONATE CLOTH 2 % EX PADS
6.0000 | MEDICATED_PAD | Freq: Every day | CUTANEOUS | Status: DC
  Administered 2020-08-27 – 2020-08-30 (×4): 6 via TOPICAL

## 2020-08-27 MED ORDER — DEXMEDETOMIDINE (PRECEDEX) IN NS 20 MCG/5ML (4 MCG/ML) IV SYRINGE
PREFILLED_SYRINGE | INTRAVENOUS | Status: AC
  Filled 2020-08-27: qty 5

## 2020-08-27 MED ORDER — PHENYLEPHRINE HCL-NACL 10-0.9 MG/250ML-% IV SOLN
INTRAVENOUS | Status: DC | PRN
  Administered 2020-08-27: 25 ug/min via INTRAVENOUS

## 2020-08-27 MED ORDER — LACTATED RINGERS IV SOLN: INTRAVENOUS | Status: DC

## 2020-08-27 MED ORDER — VANCOMYCIN HCL 1000 MG IV SOLR
INTRAVENOUS | Status: AC
  Filled 2020-08-27: qty 1000

## 2020-08-27 MED ORDER — PHENYLEPHRINE 40 MCG/ML (10ML) SYRINGE FOR IV PUSH (FOR BLOOD PRESSURE SUPPORT)
PREFILLED_SYRINGE | INTRAVENOUS | Status: AC
  Filled 2020-08-27: qty 10

## 2020-08-27 MED ORDER — LIDOCAINE HCL (CARDIAC) PF 50 MG/5ML IV SOSY
PREFILLED_SYRINGE | INTRAVENOUS | Status: DC | PRN
  Administered 2020-08-27: 50 mg via INTRAVENOUS

## 2020-08-27 MED ORDER — METOPROLOL TARTRATE 5 MG/5ML IV SOLN
INTRAVENOUS | Status: DC | PRN
  Administered 2020-08-27 (×2): 2.5 mg via INTRAVENOUS

## 2020-08-27 MED ORDER — FENTANYL CITRATE (PF) 250 MCG/5ML IJ SOLN
INTRAMUSCULAR | Status: AC
  Filled 2020-08-27: qty 5

## 2020-08-27 MED ORDER — PHENYLEPHRINE 40 MCG/ML (10ML) SYRINGE FOR IV PUSH (FOR BLOOD PRESSURE SUPPORT)
PREFILLED_SYRINGE | INTRAVENOUS | Status: AC
  Filled 2020-08-27: qty 20

## 2020-08-27 MED ORDER — BUPIVACAINE-EPINEPHRINE (PF) 0.5% -1:200000 IJ SOLN
INTRAMUSCULAR | Status: DC | PRN
  Administered 2020-08-27: 30 mL via PERINEURAL

## 2020-08-27 MED ORDER — DEXAMETHASONE SODIUM PHOSPHATE 10 MG/ML IJ SOLN
INTRAMUSCULAR | Status: AC
  Filled 2020-08-27: qty 1

## 2020-08-27 MED ORDER — ARTIFICIAL TEARS OPHTHALMIC OINT
TOPICAL_OINTMENT | OPHTHALMIC | Status: AC
  Filled 2020-08-27: qty 3.5

## 2020-08-27 MED ORDER — ALBUMIN HUMAN 25 % IV SOLN
INTRAVENOUS | Status: AC
  Filled 2020-08-27: qty 50

## 2020-08-27 MED ORDER — HYDROMORPHONE HCL 1 MG/ML IJ SOLN: 0.2500 mg | INTRAMUSCULAR | Status: DC | PRN

## 2020-08-27 MED ORDER — SODIUM CHLORIDE 0.9 % IV BOLUS
500.0000 mL | Freq: Once | INTRAVENOUS | Status: AC
  Administered 2020-08-27: 500 mL via INTRAVENOUS

## 2020-08-27 MED ORDER — METOPROLOL TARTRATE 5 MG/5ML IV SOLN: 2.5000 mg | Freq: Four times a day (QID) | INTRAVENOUS | Status: DC

## 2020-08-27 MED ORDER — PHENYLEPHRINE HCL (PRESSORS) 10 MG/ML IV SOLN
INTRAVENOUS | Status: DC | PRN
  Administered 2020-08-27 (×7): 80 ug via INTRAVENOUS
  Administered 2020-08-27: 40 ug via INTRAVENOUS
  Administered 2020-08-27 (×2): 80 ug via INTRAVENOUS

## 2020-08-27 MED ORDER — SUCCINYLCHOLINE CHLORIDE 20 MG/ML IJ SOLN
INTRAMUSCULAR | Status: DC | PRN
  Administered 2020-08-27: 120 mg via INTRAVENOUS

## 2020-08-27 MED ORDER — CEFAZOLIN SODIUM-DEXTROSE 2-4 GM/100ML-% IV SOLN
INTRAVENOUS | Status: AC
  Filled 2020-08-27: qty 100

## 2020-08-27 MED ORDER — ASPIRIN 81 MG PO CHEW
81.0000 mg | CHEWABLE_TABLET | Freq: Two times a day (BID) | ORAL | Status: DC
  Administered 2020-08-28 – 2020-09-01 (×8): 81 mg via ORAL
  Filled 2020-08-27 (×9): qty 1

## 2020-08-27 MED ORDER — METOCLOPRAMIDE HCL 5 MG/ML IJ SOLN
INTRAMUSCULAR | Status: AC
  Filled 2020-08-27: qty 2

## 2020-08-27 MED ORDER — ALBUMIN HUMAN 25 % IV SOLN: INTRAVENOUS | Status: DC | PRN

## 2020-08-27 MED ORDER — DEXAMETHASONE SODIUM PHOSPHATE 10 MG/ML IJ SOLN
INTRAMUSCULAR | Status: DC | PRN
  Administered 2020-08-27: 10 mg via INTRAVENOUS

## 2020-08-27 MED ORDER — ROCURONIUM BROMIDE 10 MG/ML (PF) SYRINGE
PREFILLED_SYRINGE | INTRAVENOUS | Status: AC
  Filled 2020-08-27: qty 10

## 2020-08-27 MED ORDER — SODIUM CHLORIDE 0.9 % IR SOLN
Status: DC | PRN
  Administered 2020-08-27: 3000 mL

## 2020-08-27 SURGICAL SUPPLY — 69 items
BIPOLAR PROS AML 45 (Hips) ×2 IMPLANT
BIT DRILL 2.8X128 (BIT) ×2 IMPLANT
BLADE SAGITTAL 25.0X1.27X90 (BLADE) ×2 IMPLANT
BRUSH FEMORAL CANAL (MISCELLANEOUS) ×2 IMPLANT
CEMENT HV SMART SET (Cement) ×4 IMPLANT
CENTRALIZER STEM HIP 12MM (Hips) ×2 IMPLANT
CHLORAPREP W/TINT 26 (MISCELLANEOUS) ×6 IMPLANT
CLOTH BEACON ORANGE TIMEOUT ST (SAFETY) ×2 IMPLANT
COVER LIGHT HANDLE STERIS (MISCELLANEOUS) ×4 IMPLANT
COVER WAND RF STERILE (DRAPES) ×2 IMPLANT
DECANTER SPIKE VIAL GLASS SM (MISCELLANEOUS) ×2 IMPLANT
DRAPE HIP W/POCKET STRL (MISCELLANEOUS) ×2 IMPLANT
DRAPE SURG 17X23 STRL (DRAPES) ×2 IMPLANT
DRAPE U-SHAPE 47X51 STRL (DRAPES) ×2 IMPLANT
DRESSING AQUACEL AG ADV 3.5X12 (MISCELLANEOUS) ×1 IMPLANT
DRSG AQUACEL AG ADV 3.5X12 (MISCELLANEOUS) ×2
DRSG MEPILEX SACRM 8.7X9.8 (GAUZE/BANDAGES/DRESSINGS) ×2 IMPLANT
ELECT REM PT RETURN 9FT ADLT (ELECTROSURGICAL) ×2
ELECTRODE REM PT RTRN 9FT ADLT (ELECTROSURGICAL) ×1 IMPLANT
GLOVE SKINSENSE NS SZ8.0 LF (GLOVE) ×4
GLOVE SKINSENSE STRL SZ8.0 LF (GLOVE) ×4 IMPLANT
GLOVE SRG 8 PF TXTR STRL LF DI (GLOVE) ×1 IMPLANT
GLOVE SURG UNDER POLY LF SZ7 (GLOVE) ×10 IMPLANT
GLOVE SURG UNDER POLY LF SZ8 (GLOVE) ×2
GOWN STRL REUS W/ TWL XL LVL3 (GOWN DISPOSABLE) ×1 IMPLANT
GOWN STRL REUS W/TWL LRG LVL3 (GOWN DISPOSABLE) ×4 IMPLANT
GOWN STRL REUS W/TWL XL LVL3 (GOWN DISPOSABLE) ×2
HANDPIECE INTERPULSE COAX TIP (DISPOSABLE) ×2
HEAD BIPOLAR PROS AML 45 (Hips) ×1 IMPLANT
HEAD FEM STD 28X+1.5 STRL (Hips) ×2 IMPLANT
INST SET MAJOR BONE (KITS) ×2 IMPLANT
IV NS IRRIG 3000ML ARTHROMATIC (IV SOLUTION) ×2 IMPLANT
KIT BLADEGUARD II DBL (SET/KITS/TRAYS/PACK) ×2 IMPLANT
KIT TURNOVER KIT A (KITS) ×2 IMPLANT
MANIFOLD NEPTUNE II (INSTRUMENTS) ×2 IMPLANT
MARKER SKIN DUAL TIP RULER LAB (MISCELLANEOUS) ×4 IMPLANT
NEEDLE HYPO 18GX1.5 BLUNT FILL (NEEDLE) IMPLANT
NEEDLE HYPO 21X1.5 SAFETY (NEEDLE) ×2 IMPLANT
NEEDLE MAYO 1/2 CRC TROCAR PT (NEEDLE) ×2 IMPLANT
NS IRRIG 1000ML POUR BTL (IV SOLUTION) ×2 IMPLANT
PACK SURGICAL SETUP 50X90 (CUSTOM PROCEDURE TRAY) ×2 IMPLANT
PACK TOTAL JOINT (CUSTOM PROCEDURE TRAY) ×2 IMPLANT
PAD ARMBOARD 7.5X6 YLW CONV (MISCELLANEOUS) ×2 IMPLANT
PASSER SUT SWANSON 36MM LOOP (INSTRUMENTS) IMPLANT
PENCIL SMOKE EVACUATOR (MISCELLANEOUS) ×2 IMPLANT
PILLOW HIP ABDUCTION LRG (ORTHOPEDIC SUPPLIES) ×2 IMPLANT
PIN STMN SNGL STERILE 9X3.6MM (PIN) ×4 IMPLANT
PRESSURIZER FEMORAL UNIV (MISCELLANEOUS) ×2 IMPLANT
RESTRICTOR CEMENT PE SZ 2 (Cement) ×2 IMPLANT
SET BASIN LINEN APH (SET/KITS/TRAYS/PACK) ×2 IMPLANT
SET HNDPC FAN SPRY TIP SCT (DISPOSABLE) ×1 IMPLANT
STAPLER VISISTAT (STAPLE) ×2 IMPLANT
STEM SUMMIT CEMENT BASIC SZ4 (Hips) ×2 IMPLANT
STRIP CLOSURE SKIN 1/2X4 (GAUZE/BANDAGES/DRESSINGS) ×2 IMPLANT
SUT BRALON NAB BRD #1 30IN (SUTURE) ×2 IMPLANT
SUT MNCRL AB 4-0 PS2 18 (SUTURE) ×2 IMPLANT
SUT MON AB 2-0 CT1 36 (SUTURE) ×2 IMPLANT
SUT MON AB 2-0 SH 27 (SUTURE) ×2
SUT MON AB 2-0 SH27 (SUTURE) ×1 IMPLANT
SUT VIC AB 1 CT1 27 (SUTURE) ×14
SUT VIC AB 1 CT1 27XBRD ANTBC (SUTURE) ×7 IMPLANT
SYR 20ML LL LF (SYRINGE) ×4 IMPLANT
SYR 30ML LL (SYRINGE) ×2 IMPLANT
SYR BULB IRRIG 60ML STRL (SYRINGE) ×2 IMPLANT
TOWER CARTRIDGE SMART MIX (DISPOSABLE) ×2 IMPLANT
TRAY FOLEY MTR SLVR 16FR STAT (SET/KITS/TRAYS/PACK) ×2 IMPLANT
WATER STERILE IRR 1000ML POUR (IV SOLUTION) ×2 IMPLANT
WATER STERILE IRR 500ML POUR (IV SOLUTION) ×2 IMPLANT
YANKAUER SUCT 12FT TUBE ARGYLE (SUCTIONS) ×2 IMPLANT

## 2020-08-27 NOTE — Progress Notes (Signed)
Pt refused tylenol  stated that "we are trying to give her medication to make her sleep" educated pt on purpose of medication, pt refused, contacted pt's son to speak with pt about the importance of taking medication, pt still refused.

## 2020-08-27 NOTE — Progress Notes (Signed)
PROGRESS NOTE   [name]  AOZ:[id number] DOB: Feb 22, 1934 DOA: 08/25/2020 PCP: Donita Brooks, MD   Chief Complaint  Patient presents with  . Fall   Level of care: Stepdown  Brief Admission History:   87 y.o. female with medical history significant for atrial fibrillation, hypertension, hyperlipidemia, GERD, gait instability (ambulates with a walker) who presents to the emergency department via EMS due to fall sustained around 2:30 AM today.  Patient states that she got up at night to use the restroom and she came back to bed, shortly after this, she got up again for an unknown reason and sustained a fall.  Son at bedside, helped her back to bed, this morning when the home health aide tried to assist her to go to the bathroom, she was unable to get up due to left hip pain.  EMS was activated and patient was taken to the ED for further evaluation and management. Xrays confirmed left hip fracture.    Assessment & Plan:   Principal Problem:   Closed left hip fracture (HCC) Active Problems:   Hyperlipidemia   Essential hypertension   OSA (obstructive sleep apnea)   Pre-diabetes   Fall at home, initial encounter   Atrial fibrillation, chronic (HCC)   Gait abnormality   Leukocytosis   Hyperglycemia   Elevated brain natriuretic peptide (BNP) level   GERD (gastroesophageal reflux disease)  Acute left subcapital hip fracture due to fall at home - pain management, appreciate ortho consultation, anticipating taking patient to OR for operative repair on 4/28.    Chronic pain with acute exacerbation from recent hip fracture - pain has been difficult to control - postop pain mgmt per ortho orders.    Atrial fibrillation - chronic - HR has been controlled with atenolol.  She is not fully anticoagulated due to frequent falling.  Has been on aspirin.  Cardiology consulted for preoperative evaluation.   Pt missed atenolol today while NPO for surgery, ordered lopressor 2.5 mg IV every 6  hours x 3 doses, hopefully can resume oral atenolol tomorrow morning.    GERD - protonix ordered for GI protection.   OSA - noncompliant with home CPAP use due to damaged equipment.  Outpatient follow up with pulmonary clinic for repeat sleep study, etc.   Hyperlipidemia - well controlled on crestor 20 mg daily.   Leukocytosis - reactive due to acute fracture.  CBC in AM.   Hypoxic respiratory failure - supplemental oxygen 2L min started at admission, wean as able.   DVT prophylaxis: SQ  Heparin, SCD Code Status: Full  Family Communication: son, daughter at bedside  Disposition: anticipating SNF Status is: Inpatient  Remains inpatient appropriate because:IV treatments appropriate due to intensity of illness or inability to take PO and Inpatient level of care appropriate due to severity of illness   Dispo: The patient is from: Home              Anticipated d/c is to: SNF              Patient currently is not medically stable to d/c.   Difficult to place patient No Consultants:   Cardiology  Orthopedics  Procedures:   n/a  Antimicrobials:  n/a   Subjective: Pt recovering from surgery in stepdown ICU.    Objective: Vitals:   08/27/20 1407 08/27/20 1432 08/27/20 1433 08/27/20 1455  BP:      Pulse: (!) 112 (!) 116 (!) 113 (!) 126  Resp: 17 18 18  17  Temp:    98.8 F (37.1 C)  TempSrc:    Oral  SpO2: 100% 100% 100% 99%  Weight:    86.1 kg  Height:    5\' 5"  (1.651 m)    Intake/Output Summary (Last 24 hours) at 08/27/2020 1516 Last data filed at 08/27/2020 1235 Gross per 24 hour  Intake 2730.37 ml  Output 400 ml  Net 2330.37 ml   Filed Weights   08/27/20 1455  Weight: 86.1 kg    Examination:  General exam: Appears calm and comfortable  Respiratory system: Clear to auscultation. Respiratory effort normal. Cardiovascular system: normal S1 & S2 heard. No JVD, murmurs, rubs, gallops or clicks. No pedal edema. Gastrointestinal system: Abdomen is nondistended,  soft and nontender. No organomegaly or masses felt. Normal bowel sounds heard. Central nervous system: Alert and oriented. No focal neurological deficits. Extremities: bandages clean dry and intact. Palpable pulses BLEs.  Skin: No rashes, lesions or ulcers Psychiatry: Judgement and insight appear normal. Mood & affect appropriate.   Data Reviewed: I have personally reviewed following labs and imaging studies  CBC: Recent Labs  Lab 08/25/20 1610 08/26/20 0459 08/27/20 0426  WBC 11.3* 12.4* 10.4  NEUTROABS 8.6*  --   --   HGB 13.1 12.3 11.7*  HCT 43.0 40.3 39.5  MCV 96.6 97.6 98.0  PLT 169 148* 137*    Basic Metabolic Panel: Recent Labs  Lab 08/25/20 1610 08/26/20 0459 08/27/20 0426  NA 134* 138 136  K 4.6 4.5 4.4  CL 98 99 98  CO2 28 28 30   GLUCOSE 141* 127* 101*  BUN 23 27* 30*  CREATININE 0.96 0.98 0.87  CALCIUM 8.8* 9.2 8.7*  MG  --  1.7 2.2  PHOS  --  3.2  --     GFR: Estimated Creatinine Clearance: 52.1 mL/min (by C-G formula based on SCr of 0.87 mg/dL).  Liver Function Tests: Recent Labs  Lab 08/25/20 1610 08/26/20 0459  AST 31 22  ALT 21 17  ALKPHOS 52 46  BILITOT 1.0 1.2  PROT 7.1 6.6  ALBUMIN 3.6 3.4*    CBG: Recent Labs  Lab 08/27/20 1318  GLUCAP 128*    Recent Results (from the past 240 hour(s))  SARS CORONAVIRUS 2 (TAT 6-24 HRS) Nasopharyngeal Nasopharyngeal Swab     Status: None   Collection Time: 08/25/20  5:52 PM   Specimen: Nasopharyngeal Swab  Result Value Ref Range Status   SARS Coronavirus 2 NEGATIVE NEGATIVE Final    Comment: (NOTE) SARS-CoV-2 target nucleic acids are NOT DETECTED.  The SARS-CoV-2 RNA is generally detectable in upper and lower respiratory specimens during the acute phase of infection. Negative results do not preclude SARS-CoV-2 infection, do not rule out co-infections with other pathogens, and should not be used as the sole basis for treatment or other patient management decisions. Negative results must  be combined with clinical observations, patient history, and epidemiological information. The expected result is Negative.  Fact Sheet for Patients: HairSlick.no  Fact Sheet for Healthcare Providers: quierodirigir.com  This test is not yet approved or cleared by the [place] FDA and  has been authorized for detection and/or diagnosis of SARS-CoV-2 by FDA under an Emergency Use Authorization (EUA). This EUA will remain  in effect (meaning this test can be used) for the duration of the COVID-19 declaration under Se ction 564(b)(1) of the Act, 21 U.S.C. section 360bbb-3(b)(1), unless the authorization is terminated or revoked sooner.  Performed at [facility], [address],  [place], [place] [address]   Surgical pcr screen     Status: None   Collection Time: 08/27/20  1:51 AM   Specimen: Nasal Mucosa; Nasal Swab  Result Value Ref Range Status   MRSA, PCR NEGATIVE NEGATIVE Final   Staphylococcus aureus NEGATIVE NEGATIVE Final    Comment: (NOTE) The Xpert SA Assay (FDA approved for NASAL specimens in patients 75 years of age and older), is one component of a comprehensive surveillance program. It is not intended to diagnose infection nor to guide or monitor treatment. Performed at [facility], [address], [place], [place] [address]      Radiology Studies: CT Head Wo Contrast  Result Date: 08/25/2020 CLINICAL DATA:  Un witnessed fall last night unsure of head injury EXAM: CT HEAD WITHOUT CONTRAST CT CERVICAL SPINE WITHOUT CONTRAST TECHNIQUE: Multidetector CT imaging of the head and cervical spine was performed following the standard protocol without intravenous contrast. Multiplanar CT image reconstructions of the cervical spine were also generated. COMPARISON:  March 27, 2018 and February 23, 2018. FINDINGS: CT HEAD FINDINGS Brain: No evidence of acute large vascular territory infarction, hemorrhage, extra-axial  collection or mass lesion/mass effect. Stable global parenchymal volume loss with ex vacuo dilatation of ventricular system. Similar severe burden chronic microvascular ischemic white matter disease. Chronic low type infarcts in the right pons and bilateral basal ganglia. Vascular: No hyperdense vessel. Dense atherosclerotic calcifications of the skull base vessels with involvement of the bilateral MCAs. Skull: Normal. Negative for fracture or focal lesion. Sinuses/Orbits: The paranasal sinuses and mastoid air cells are predominantly clear. Orbits are grossly unremarkable. Other: None CT CERVICAL SPINE FINDINGS Alignment: Normal with preservation of the cervical lordosis. Degenerative anterolisthesis of C5 on C6. Skull base and vertebrae: No acute fracture. No primary bone lesion or focal pathologic process. Soft tissues and spinal canal: No prevertebral fluid or swelling. No visible canal hematoma. Mineralized pannus posterior to the dens. Disc levels: Multilevel degenerative changes cervical spine including disc space narrowing, disc osteophyte complex and uncovertebral/facet hypertrophy most notable at C6-C7 Upper chest: None Other: Dense carotid artery calcifications. IMPRESSION: 1. No evidence of acute intracranial pathology. 2. Similar global parenchymal volume loss and sequela of microvascular ischemic disease. 3. No evidence of acute displaced fracture or subluxation of the cervical spine. 4. Multilevel degenerative changes of the cervical spine. 5. Dense carotid artery calcifications. Electronically Signed   By: Maudry Mayhew MD   On: 08/25/2020 17:36   CT Cervical Spine Wo Contrast  Result Date: 08/25/2020 CLINICAL DATA:  Un witnessed fall last night unsure of head injury EXAM: CT HEAD WITHOUT CONTRAST CT CERVICAL SPINE WITHOUT CONTRAST TECHNIQUE: Multidetector CT imaging of the head and cervical spine was performed following the standard protocol without intravenous contrast. Multiplanar CT image  reconstructions of the cervical spine were also generated. COMPARISON:  March 27, 2018 and February 23, 2018. FINDINGS: CT HEAD FINDINGS Brain: No evidence of acute large vascular territory infarction, hemorrhage, extra-axial collection or mass lesion/mass effect. Stable global parenchymal volume loss with ex vacuo dilatation of ventricular system. Similar severe burden chronic microvascular ischemic white matter disease. Chronic low type infarcts in the right pons and bilateral basal ganglia. Vascular: No hyperdense vessel. Dense atherosclerotic calcifications of the skull base vessels with involvement of the bilateral MCAs. Skull: Normal. Negative for fracture or focal lesion. Sinuses/Orbits: The paranasal sinuses and mastoid air cells are predominantly clear. Orbits are grossly unremarkable. Other: None CT CERVICAL SPINE FINDINGS Alignment: Normal with preservation of the cervical lordosis. Degenerative  anterolisthesis of C5 on C6. Skull base and vertebrae: No acute fracture. No primary bone lesion or focal pathologic process. Soft tissues and spinal canal: No prevertebral fluid or swelling. No visible canal hematoma. Mineralized pannus posterior to the dens. Disc levels: Multilevel degenerative changes cervical spine including disc space narrowing, disc osteophyte complex and uncovertebral/facet hypertrophy most notable at C6-C7 Upper chest: None Other: Dense carotid artery calcifications. IMPRESSION: 1. No evidence of acute intracranial pathology. 2. Similar global parenchymal volume loss and sequela of microvascular ischemic disease. 3. No evidence of acute displaced fracture or subluxation of the cervical spine. 4. Multilevel degenerative changes of the cervical spine. 5. Dense carotid artery calcifications. Electronically Signed   By: Maudry Mayhew MD   On: 08/25/2020 17:36   DG CHEST PORT 1 VIEW  Result Date: 08/27/2020 CLINICAL DATA:  Dyspnea EXAM: PORTABLE CHEST 1 VIEW COMPARISON:  08/26/2020  FINDINGS: Multiple nodular pulmonary infiltrates are developed within the right lung, new since prior examination, possibly infectious or inflammatory in the acute setting. Mild prominence of the pulmonary interstitium is again noted in keeping with chronic interstitial change. No pneumothorax or pleural effusion. The thoracic aorta is tortuous and ectatic, similar to that noted on prior examination. Cardiac size is stably, mildly enlarged. Pulmonary vascularity is normal. IMPRESSION: Development of multifocal nodular infiltrate within the right lung, possibly infectious or inflammatory in the acute setting. Follow-up chest radiograph in 3-4 weeks is recommended to document resolution. Stable ectasias of the thoracic aorta. Stable cardiomegaly. Electronically Signed   By: Helyn Numbers MD   On: 08/27/2020 04:37   DG CHEST PORT 1 VIEW  Result Date: 08/26/2020 CLINICAL DATA:  Shortness of breath. EXAM: PORTABLE CHEST 1 VIEW COMPARISON:  08/25/2020 FINDINGS: The heart is within normal limits in size. There is stable tortuosity, ectasia and calcification of the thoracic aorta. Mild chronic bronchitic type interstitial lung changes but no infiltrates, edema or effusions. No pulmonary lesions. No pneumothorax. The bony thorax is intact.  Remote healed rib fractures are noted. IMPRESSION: No acute cardiopulmonary findings. Electronically Signed   By: Rudie Meyer M.D.   On: 08/26/2020 17:39   DG Chest Port 1 View  Result Date: 08/25/2020 CLINICAL DATA:  Fall EXAM: PORTABLE CHEST 1 VIEW COMPARISON:  May 21, 2020. FINDINGS: Unchanged cardiac enlargement. Tortuous calcified thoracic aorta. No focal consolidation. No significant pleural effusion or visible pneumothorax. Chronic thoracolumbar junction compression deformity. IMPRESSION: Stable exam, no acute pulmonary findings. Electronically Signed   By: Maudry Mayhew MD   On: 08/25/2020 17:38   DG HIP UNILAT WITH PELVIS 2-3 VIEWS LEFT  Result Date:  08/27/2020 CLINICAL DATA:  Left hip replacement. EXAM: DG HIP (WITH OR WITHOUT PELVIS) 2-3V LEFT COMPARISON:  Left hip x-rays dated August 25, 2020. FINDINGS: The left hip demonstrates a hemiarthroplasty without evidence of hardware failure or complication. There is no fracture or dislocation. The alignment is anatomic. Post-surgical changes noted in the surrounding soft tissues. IMPRESSION: 1. Left hip hemiarthroplasty without acute postoperative complication. Electronically Signed   By: Obie Dredge M.D.   On: 08/27/2020 13:59   DG Hip Unilat W or Wo Pelvis 2-3 Views Left  Result Date: 08/25/2020 CLINICAL DATA:  Left hip pain after fall. EXAM: DG HIP (WITH OR WITHOUT PELVIS) 2-3V LEFT COMPARISON:  July 03, 2017. FINDINGS: Mildly displaced and foreshortened left subcapital hip fracture. The femoral head appears well-seated within the acetabulum. Pelvic phleboliths. Vascular calcifications. IMPRESSION: Mildly displaced and foreshortened subcapital left hip fracture. Electronically Signed  By: Maudry Mayhew MD   On: 08/25/2020 17:24    Scheduled Meds: . acetaminophen  650 mg Oral Q6H  . [START ON 08/28/2020] aspirin  81 mg Oral BID  . atenolol  50 mg Oral Daily  . calcium-vitamin D  1 tablet Oral Daily  . Chlorhexidine Gluconate Cloth  6 each Topical Daily  . diclofenac sodium  2 g Topical QID  . DULoxetine  30 mg Oral Daily  . DULoxetine  60 mg Oral Daily  . gabapentin  800 mg Oral TID  . [START ON 08/28/2020] heparin  5,000 Units Subcutaneous Q8H  . metoprolol tartrate  2.5 mg Intravenous Q6H  . pantoprazole  40 mg Oral Daily  . rosuvastatin  40 mg Oral Daily   Continuous Infusions: . sodium chloride 75 mL/hr at 08/27/20 0557  .  ceFAZolin (ANCEF) IV       LOS: 2 days   Time spent: 35 mins   Steadman Prosperi Laural Benes, MD How to contact the [facility] Attending or Consulting provider 7A - 7P or covering provider during after hours 7P -7A, for this patient?  1. Check the care team in [facility] and look  for a) attending/consulting [facility] provider listed and b) the [facility] team listed 2. Log into [email or web address] and use [place]'s universal password to access. If you do not have the password, please contact the hospital operator. 3. Locate the [facility] provider you are looking for under [facility] and page to a number that you can be directly reached. 4. If you still have difficulty reaching the provider, please page the [facility] (Director on Call) for the Hospitalists listed on amion for assistance.  08/27/2020, 3:16 PM

## 2020-08-27 NOTE — Progress Notes (Signed)
Patient refused CPAP. Stated she doesn't wear one at home and does not wish to wear one here. Unit available if patient needs one or changes her mind.

## 2020-08-27 NOTE — Op Note (Signed)
Orthopaedic Surgery Operative Note (CSN: [id number])  [name]  01-24-1934 Date of Surgery: 08/27/2020   Diagnoses:  Displaced left femoral neck fracture  Procedure: Cemented left hip hemiarthroplasty   Operative Finding Successful completion of the planned procedure.     Post-Op Diagnosis: Same Surgeons:Primary: Mordecai Rasmussen, MD Assistants: Marquita Palms Location: [facility] OR ROOM 4 Anesthesia: General with local anesthesia Antibiotics: Ancef 2 g with local vancomycin powder 1 g at the surgical site Tourniquet time: N/A Estimated Blood Loss: 440 cc Complications: None Specimens: None Implants: Implant Name Type Inv. Item Serial No. Manufacturer Lot No. LRB No. Used Action  CEMENT HV SMART SET - [id number] Cement CEMENT HV SMART SET  DEPU[employer]TH[employer] [id number] Left 2 Implanted  BIPOLAR PROS AML 45MM - [id number] Hips BIPOLAR PROS AML 45MM  DEPUY ORTHOPAEDICS [id number] Left 1 Implanted  CENTRALIZER STEM HIP 12MM - [id number] Hips CENTRALIZER STEM HIP 12MM  DEPUY ORTHOPAEDICS [id number] Left 1 Implanted  STEM SUMMIT CEMENT BASIC SZ4 - [id number] Hips STEM SUMMIT CEMENT BASIC SZ4  DEPUY ORTHOPAEDICS [id number] Left 1 Implanted  HIP BALL ARTICU DEPUY - [id number] Hips HIP BALL ARTICU DEPUY  DEPUY ORTHOPAEDICS [id number] Left 1 Implanted  RESTRICTOR CEMENT PE SZ 2 - [id number] Cement RESTRICTOR CEMENT PE SZ 2  DEPUY ORTHOPAEDICS [id number] Left 1 Implanted    Indications for Surgery:   [name] is a 87 y.o. female with with a displaced left femoral neck fracture sustained after mechanical fall.  She was admitted to the medicine service, and underwent a thorough preoperative evaluation.  Anesthesia, cardiology and medicine all provided assistance and stated that she was healthy enough to proceed with surgery. Benefits and risks of operative and nonoperative management were discussed prior to surgery with patient's HCPOA and informed consent form was completed.  Specific risks including infection,  need for additional surgery, bleeding, pain, stiffness, dislocation, transfusion requirement and more severe complications associated with anesthesia.  The family elected to proceed as she was confused following her admission tot he hospital.   Procedure:   The patient was identified properly. Informed consent was obtained and the surgical site was marked. The patient was taken to the OR where general anesthesia was induced.  The patient was positioned lateral, left side up.  The left leg was prepped and draped in the usual sterile fashion.  Timeout was performed before the beginning of the case.  Ancef dosing was confirmed prior to incision.  Patient received 1 g of TXA before the start of the case  We started by making an incision centered over the greater trochanter with a scalpel. We used the scalpel to continue to dissect to the fascia.  Electrocautery was used to help with hemostasis.  A cobb elevator was used to clear the IT band and then it was pierced with Bovie electrocautery. Mayo scissors were used to cut the fascia in a longitudinal fashion. The gluteus maximus fibers were bluntly split in line with their fibers.   The femur was slowly internally rotated, putting tension on the posterior structures. The short external rotators were identified and tagged with #1 vicryl.  The tendon was friable and the stitch ultimately pulled through the tissue.  Next, we identified the capsule and made a T capsulotomy.  The capsule was then tagged with #1 Vicryl sutures.   Next, we visualized the femoral neck fracture. We identified the sciatic nerve by palpation and verified that it was not in danger during the dissection.    We then  made a neck cut approximately 1.5 cm above the lesser trochanter with a saw. We removed the femoral head.  It was measured as a trial for the implant. We used the box cutter to cut away some of the greater trochanter for ease of insertion of the stem. We then used the canal  finder to locate the femoral canal.   Using the angle of the femoral neck as our guide for version, we broached sequentially. We trialed components and found appropriate fit and stability.  The patient would come to full extension of the hip. The hip was stable at 90 degrees of flexion, and 45 degrees of internal rotation.  Leg lengths were approximately equal.  We then removed the trials as well as the broach. We ensured there were no foreign bodies or bone within the acetabulum. We copiously irrigated the wound.  We measured and placed an appropriately sized cement restrictor within the canal.  Cement was mixed on the back table and when ready, cement was inserted into the canal and pressurized.  We then carefully placed our stem.  Excess cement was removed immediately.  We maintained pressure on the stem while the cement hardened.  The appropriately sized head listed above was placed on to the stem and the hip was reduced. Again, we noted it was stable in the previously mentioned manipulations.   We repaired the posterior capsule to the greater trochanter through bone tunnels. We closed the fascia of the iliotibial band and gluteus maximus with running and intterupted Vicryl sutures. We then closed Scarpa's fascia with Vicryl sutures. Skin was closed with 2-0 monocryl, staples and Steri-Strips and an Aquacel dressing was placed.  Local anesthesia was placed around the incision.  A hip abduction pillow was secured.  Patient was awoken and taken to PACU in stable condition.   Post-operative plan:  The patient will be WBAT on the operative extremity. Hip abduction pillow in place at all times when in bed Posterior hip precautions PT/OT    DVT prophylaxis Aspirin 81 mg twice daily for 6 weeks.    Pain control with PRN pain medication preferring oral medicines.   Follow up plan will be scheduled in approximately 14 days for incision check and XR.  If she is DC to a facility and they are comfortable  removing the staples around 2 weeks postop, I can see her in clinic at 6 weeks postop.  If there are any concerns, at any time, I can see her in follow up in the clinic.

## 2020-08-27 NOTE — Progress Notes (Signed)
   ORTHOPAEDIC PROGRESS NOTE  Procedure(s): Plan for left hip hemiarthroplasty for a displaced femoral neck fracture  DOS: 08/27/20  SUBJECTIVE: Issues with pain over night.  According to family at bedside she is confused, calling out for her mother.   She is not responding to questions appropriately.  Evaluated by anesthesia, medicine and cardiology prior to surgery.  OBJECTIVE: PE:  Confused, not answering questions.  Appears uncomfortable. Toes warm and well perfused, 2+ DP pulse Active motion intact to EHL/TA/GS No bruising over the lateral thigh Pain with minimal movement of the left.   Vitals:   08/26/20 2114 08/27/20 0500  BP: 111/68 101/65  Pulse: 93 98  Resp: 18 19  Temp: 98.4 F (36.9 C) 97.8 F (36.6 C)  SpO2: 98% 97%     ASSESSMENT: [name] is a 87 y.o. female doing well, stable for surgery.  Cardiology consult yesterday.  Anesthesia has evaluated the patient.  We will plan for her to go to an ICU bed postop for close monitoring.  Case discussed with patient and her family.  All questions answered.  Consent has been completed.   PLAN: Weightbearing: NWB LLE Insicional and dressing care: Reinforce dressings as needed; none currently Orthopedic device(s): None VTE prophylaxis: to be determined following surgery; plan for aspirin 81 mg BID due to fall risk  Pain control: PRN; patient has a history of narcotic use Follow - up plan: 2 weeks   Contact information:     Shannie Kontos A. Amedeo Kinsman, MD [place] [address] [place],  [place]  [address] Phone: [phone] Fax: [phone]

## 2020-08-27 NOTE — Transfer of Care (Signed)
Immediate Anesthesia Transfer of Care Note  Patient: [name]  Procedure(s) Performed: ARTHROPLASTY BIPOLAR HIP (HEMIARTHROPLASTY) (Left Hip)  Patient Location: PACU  Anesthesia Type:General  Level of Consciousness: awake and patient cooperative  Airway & Oxygen Therapy: Patient Spontanous Breathing and non-rebreather face mask  Post-op Assessment: Report given to RN and Post -op Vital signs reviewed and stable  Post vital signs: Reviewed and stable  Last Vitals:  Vitals Value Taken Time  BP 151/111 08/27/20 1253  Temp    Pulse 110 08/27/20 1259  Resp 24 08/27/20 1259  SpO2 95 % 08/27/20 1259  Vitals shown include unvalidated device data.  Last Pain:  Vitals:   08/27/20 0849  TempSrc: Oral  PainSc: 0-No pain      Patients Stated Pain Goal:  (unable to give rating confused,   grunts occassional) (85/63/14 9702)  Complications: No complications documented.

## 2020-08-27 NOTE — Anesthesia Procedure Notes (Signed)
Procedure Name: Intubation Date/Time: 08/27/2020 9:45 AM Performed by: Vista Deck, CRNA Pre-anesthesia Checklist: Patient identified, Patient being monitored, Timeout performed, Emergency Drugs available and Suction available Patient Re-evaluated:Patient Re-evaluated prior to induction Oxygen Delivery Method: Circle System Utilized Preoxygenation: Pre-oxygenation with 100% oxygen Induction Type: IV induction Laryngoscope Size: Mac and 3 Grade View: Grade I Tube type: Oral Tube size: 7.0 mm Number of attempts: 1 Airway Equipment and Method: stylet Placement Confirmation: ETT inserted through vocal cords under direct vision,  positive ETCO2 and breath sounds checked- equal and bilateral Secured at: 22 cm Tube secured with: Tape Dental Injury: Teeth and Oropharynx as per pre-operative assessment

## 2020-08-27 NOTE — Consult Note (Signed)
Consultation Note Date: 08/27/2020   Patient Name: [name]  DOB: Apr 11, 1934  MRN: [id number]  Age / Sex: 87 y.o., female  PCP: [name], MD Referring Physician: Murlean Iba, MD  Reason for Consultation: Establishing goals of care and Psychosocial/spiritual support  HPI/Patient Profile: 87 y.o. female  with past medical history of dementia, afib, HTN/[name], GERD, diverticulosis, gait instablility/walker, [name], RA, sepsis 2/2 UTI, osteopenia, OA, OSA-CPAP, hx E coli bacterremia admitted on 08/25/2020 with closed L hip fracture repair 4/28, from home.   Clinical Assessment and Goals of Care: I have reviewed medical records including EPIC notes, labs and imaging, received report from bedside nursing staff, examined the patient.  [name] is lying quietly in bed.  She will make an somewhat keep eye contact.  She appears acutely/chronically ill and somewhat frail.  She is able to tell me her name, but not where we are.  She keeps repeating that her birthday is [birth year].  I am not sure that she can make her basic needs known.  There is no family at bedside at this time.  Call to son, [name] to discuss diagnosis prognosis, Industry, EOL wishes, disposition and options.  I introduced Palliative Medicine as specialized medical care for people living with serious illness. It focuses on providing relief from the symptoms and stress of a serious illness.   We discussed a brief life review of the patient.  [name] is divorced.  Her 2 sons, [name] and [name] live in her home along with her 2 stepsons.  She has 2 daughters who also live nearby.  She has good family support.  As far as functional and nutritional status, she has a CAP aide to help with personal needs 5 days/week, for several years now.  Family attends to the household needs.  [name] shares that [name] has a hard time explaining her thoughts, and  that seems to be evident during this hospitalization  We discussed her current illness and what it means in the larger context of her on-going co-morbidities.  Natural disease trajectory and expectations at EOL were discussed.  We talked about her acute health concerns, but not her chronic issues during this conversation.  We plan for a face-to-face meeting at bedside tomorrow.  Advanced directives, concepts specific to code status, and rehospitalization were considered and discussed.  Ivin Booty tells me that Mrs. Walth has not told them her wishes, but he feels that he and his Sister [name] would want "everything done to keep her alive".  We talked about the concept of "treat the treatable but on the lower natural death".  I shared that we will continue discussions about the "what if's and maybes".  Questions and concerns were addressed.  The family was encouraged to call with questions or concerns.   Conference with attending, surgeon, bedside nursing staff, transition of care team related to patient condition, needs, goals of care, disposition.    HCPOA  NEXT OF KIN - two sons, [name], 2 stepsons from former  spouse live with , 2 daughters. She is divorced.   [name] and Sharon make choices.   SUMMARY OF RECOMMENDATIONS   Continue to treat the treatable Considering CODE STATUS Family would agree to STR Has been to [facility].  Would benefit from outpatient palliative  Code Status/Advance Care Planning:  Full code - [name] will discuss with Janie  Symptom Management:   Per hospitalist/surgeon, no additional needs at this time.   Palliative Prophylaxis:   Frequent Pain Assessment, Oral Care, Palliative Wound Care and Turn Reposition  Additional Recommendations (Limitations, Scope, Preferences):  Full Scope Treatment  Psycho-social/Spiritual:   Desire for further Chaplaincy support:no  Additional Recommendations: Caregiving  Support/Resources and Education on  Hospice  Prognosis:   Unable to determine,   Discharge Planning: agreeable to STR, would benefit from outpatient palliative      Primary Diagnoses: Present on Admission: . Closed left hip fracture ([place]) . Essential hypertension . Hyperlipidemia . Pre-diabetes . OSA (obstructive sleep apnea)   I have reviewed the medical record, interviewed the patient and family, and examined the patient. The following aspects are pertinent.  Past Medical History:  Diagnosis Date  . [name] (abdominal aortic aneurysm) ([place]) 09/2013   3.6 cm  . Acute and chronic respiratory failure with hypoxia ([place])   . Acute delirium   . Acute encephalopathy   . Acute pyelonephritis   . Atrial fibrillation ([place])   . Chronic pain syndrome   . Constipation   . Dementia ([place])   . Dependent edema    bilateral legs   . Depression   . Diabetes mellitus    pre-diabetes  . Diverticulosis   . Diverticulosis   . Dysuria   . E coli bacteremia   . Fall   . Gross hematuria   . Hydronephrosis with renal and ureteral calculous obstruction   . Hyperlipidemia   . Hypertension   . Hypoxemia   . Idiopathic peripheral autonomic neuropathy   . Infestation by bed bug   . Nephrolithiasis   . OSA on CPAP   . Osteoarthritis   . Osteopenia    T=  -2.1 in hip  . Persistent mood (affective) disorder, unspecified ([place])   . Prediabetes   . Rheumatoid arthritis ([place])   . Rheumatoid arthritis(714.0)   . S/P carpal tunnel release   . Sepsis secondary to UTI ([place])   . Spinal stenosis   . Unspecified fracture of fifth lumbar vertebra, initial encounter for closed fracture ([place])   . Unspecified fracture of t11-T12 vertebra, initial encounter for closed fracture ([place])   . Urinary incontinence   . Urinary tract infection    hx of   . Urolithiasis    Social History   Socioeconomic History  . Marital status: Divorced    Spouse name: Not on file  . Number of children: 6  . Years of education: 8th grade  . Highest  education level: Not on file  Occupational History  . Occupation: Retired  Tobacco Use  . Smoking status: Never Smoker  . Smokeless tobacco: Never Used  Vaping Use  . Vaping Use: Never used  Substance and Sexual Activity  . Alcohol use: No  . Drug use: No  . Sexual activity: Not on file  Other Topics Concern  . Not on file  Social History Narrative   Lives at home with 2 sons   She has four living children.   Right-handed.   Two cups caffeine per day.   Social Determinants of Health  Financial Resource Strain: Not on file  Food Insecurity: Not on file  Transportation Needs: Not on file  Physical Activity: Not on file  Stress: Not on file  Social Connections: Not on file   Family History  Problem Relation Age of Onset  . Stroke Father   . Coronary artery disease Son        CABG at age 71  . Heart disease Son   . Heart attack Son   . Stroke Mother   . Hypertension Sister   . Diabetes Brother   . Hypertension Maternal Aunt    Scheduled Meds: . [MAR Hold] acetaminophen  650 mg Oral Q6H  . [MAR Hold] aspirin EC  81 mg Oral Daily  . [MAR Hold] atenolol  50 mg Oral Daily  . [MAR Hold] calcium-vitamin D  1 tablet Oral Daily  . [MAR Hold] diclofenac sodium  2 g Topical QID  . [MAR Hold] DULoxetine  30 mg Oral Daily  . [MAR Hold] DULoxetine  60 mg Oral Daily  . [MAR Hold] gabapentin  800 mg Oral TID  . [MAR Hold] heparin  5,000 Units Subcutaneous Q8H  . [MAR Hold] pantoprazole  40 mg Oral Daily  . [MAR Hold] rosuvastatin  40 mg Oral Daily   Continuous Infusions: . sodium chloride 75 mL/hr at 08/27/20 0557  . lactated ringers 50 mL/hr at 08/27/20 0846   PRN Meds:.0.9 % irrigation (POUR BTL), [MAR Hold] ALPRAZolam, HYDROmorphone (DILAUDID) injection, [MAR Hold]  HYDROmorphone (DILAUDID) injection, [MAR Hold] oxyCODONE, sodium chloride irrigation, vancomycin Medications Prior to Admission:  Prior to Admission medications   Medication Sig Start Date End Date Taking?  Authorizing Provider  acetaminophen (TYLENOL) 500 MG tablet Take 500 mg by mouth every 8 (eight) hours as needed for mild pain.    Yes [provider]  ammonium lactate (AMLACTIN) 12 % cream Apply topically as needed for dry skin. 08/17/18  Yes Trula Slade, DPM  aspirin EC 81 MG tablet Take 81 mg by mouth daily.   Yes [provider]  atenolol (TENORMIN) 50 MG tablet Take 1 tablet (50 mg total) by mouth daily. 05/19/20  Yes [name], MD  Calcium Carbonate-Vitamin D3 600-400 MG-UNIT TABS Take 1 tablet by mouth daily. 02/04/20  Yes [provider]  diclofenac sodium (VOLTAREN) 1 % GEL Apply 2 g topically 4 (four) times daily. Rub into affected area of foot 2 to 4 times daily 08/17/18  Yes Trula Slade, DPM  DULoxetine (CYMBALTA) 30 MG capsule Take 1 capsule (30 mg total) by mouth daily. Takes along with 60mg  for total daily dose of 90mg . 01/07/20  Yes Pickard, [name], MD  DULoxetine (CYMBALTA) 60 MG capsule Take 1 capsule (60 mg total) by mouth daily. Take along with 30mg  for a total daily dose of 90mg . 01/07/20  Yes Pickard, [name], MD  gabapentin (NEURONTIN) 800 MG tablet Take 1 tablet (800 mg total) by mouth 3 (three) times daily. 08/17/20  Yes Marcial Pacas, MD  mupirocin ointment (BACTROBAN) 2 % APPLY TO WOUND TWICE DAILY 05/22/20  Yes Evelina Bucy, DPM  nystatin cream (MYCOSTATIN) Apply 1 application topically 2 (two) times daily. 04/07/20  Yes Drayton, Modena Nunnery, MD  oxyCODONE-acetaminophen (PERCOCET) 7.5-325 MG tablet Take 1 tablet by mouth every 8 (eight) hours as needed for severe pain. 07/07/20  Yes [name], MD  pantoprazole (PROTONIX) 40 MG tablet TAKE 1 TABLET BY MOUTH ONCE DAILY 07/21/20  Yes [name], MD  rosuvastatin (  CRESTOR) 40 MG tablet TAKE 1 TABLET BY MOUTH EACH EVENING 08/20/20  Yes [name], MD   Allergies  Allergen Reactions  . Sulfonamide Derivatives Hives and Rash   Review of Systems  Unable to perform ROS:  Acuity of condition    Physical Exam Vitals and nursing note reviewed.  Constitutional:      General: She is in acute distress.     Appearance: She is obese. She is ill-appearing.  HENT:     Mouth/Throat:     Mouth: Mucous membranes are moist.  Cardiovascular:     Rate and Rhythm: Normal rate.  Pulmonary:     Effort: Pulmonary effort is normal. No respiratory distress.  Abdominal:     Palpations: Abdomen is soft.     Tenderness: There is no abdominal tenderness. There is no guarding.  Musculoskeletal:     Comments: Left hip repair surgical dressing in place  Skin:    General: Skin is warm and dry.  Neurological:     Mental Status: She is alert.     Comments: Oriented to self only  Psychiatric:     Comments: Tearful, confused     Vital Signs: BP (!) 133/96   Pulse (!) 107   Temp 98.1 F (36.7 C) (Oral)   Resp 17   Ht 5\' 5"  (1.651 m)   SpO2 98%   BMI 29.12 kg/m  Pain Scale: PAINAD   Pain Score:  (sleeping ,  grunts occassional)   SpO2: SpO2: 98 % O2 Device:SpO2: 98 % O2 Flow Rate: .O2 Flow Rate (L/min): 2 L/min  IO: Intake/output summary:   Intake/Output Summary (Last 24 hours) at 08/27/2020 1239 Last data filed at 08/27/2020 1235 Gross per 24 hour  Intake 2756.75 ml  Output 700 ml  Net 2056.75 ml    LBM:   Baseline Weight:   Most recent weight:       Palliative Assessment/Data:   Flowsheet Rows   Flowsheet Row Most Recent Value  Intake Tab   Referral Department Hospitalist  Unit at Time of Referral Cardiac/Telemetry Unit  Palliative Care Primary Diagnosis Trauma  Date Notified 08/26/20  Palliative Care Type New Palliative care  Reason for referral Clarify Goals of Care  Date of Admission 08/25/20  Date first seen by Palliative Care 08/27/20  # of days Palliative referral response time 1 Day(s)  # of days IP prior to Palliative referral 1  Clinical Assessment   Palliative Performance Scale Score 30%  Pain Max last 24 hours Not able to report   Pain Min Last 24 hours Not able to report  Dyspnea Max Last 24 Hours Not able to report  Dyspnea Min Last 24 hours Not able to report  Psychosocial & Spiritual Assessment   Palliative Care Outcomes       Time In: 1520 Time Out: 1630 Time Total: 70 minutes Greater than 50%  of this time was spent counseling and coordinating care related to the above assessment and plan.  Signed by: Drue Novel, NP   Please contact Palliative Medicine Team phone at [phone] for questions and concerns.  For individual provider: See Shea Evans

## 2020-08-27 NOTE — Anesthesia Postprocedure Evaluation (Signed)
Anesthesia Post Note  Patient: [name]  Procedure(s) Performed: ARTHROPLASTY BIPOLAR HIP (HEMIARTHROPLASTY) (Left Hip)  Patient location during evaluation: PACU Anesthesia Type: General Level of consciousness: awake and sedated Pain management: pain level controlled Vital Signs Assessment: post-procedure vital signs reviewed and stable Respiratory status: spontaneous breathing, respiratory function stable and patient connected to face mask oxygen Cardiovascular status: blood pressure returned to baseline and stable Postop Assessment: no apparent nausea or vomiting Anesthetic complications: no   No complications documented.   Last Vitals:  Vitals:   08/27/20 1403 08/27/20 1404  BP:    Pulse: (!) 111 (!) 107  Resp: 16 16  Temp:    SpO2: 100% 100%    Last Pain:  Vitals:   08/27/20 1355  TempSrc:   PainSc: Asleep                 Michi Herrmann C Bryanda Mikel

## 2020-08-27 NOTE — Progress Notes (Signed)
    Cardiology consulted on 08/26/2020 for preoperative cardiac clearance in regards to left hip hemiarthroplasty. Cleared to proceed without further testing as outlined in the full consult note. Please let us know if we can be of further assistance this admission.   [facility] will sign off.   Follow up as an outpatient: Keep routine follow-up with Dr. Stanford Breed.   Signed, Erma Heritage, PA-C 08/27/2020, 9:20 AM Pager: [phone]

## 2020-08-28 ENCOUNTER — Encounter (HOSPITAL_COMMUNITY): Payer: Self-pay | Admitting: Orthopedic Surgery

## 2020-08-28 DIAGNOSIS — Z7189 Other specified counseling: Secondary | ICD-10-CM | POA: Diagnosis not present

## 2020-08-28 DIAGNOSIS — G894 Chronic pain syndrome: Secondary | ICD-10-CM

## 2020-08-28 DIAGNOSIS — Z515 Encounter for palliative care: Secondary | ICD-10-CM | POA: Diagnosis not present

## 2020-08-28 DIAGNOSIS — F0390 Unspecified dementia without behavioral disturbance: Secondary | ICD-10-CM | POA: Diagnosis not present

## 2020-08-28 DIAGNOSIS — S72002D Fracture of unspecified part of neck of left femur, subsequent encounter for closed fracture with routine healing: Secondary | ICD-10-CM | POA: Diagnosis not present

## 2020-08-28 LAB — COMPREHENSIVE METABOLIC PANEL
ALT: 11 U/L (ref 0–44)
AST: 18 U/L (ref 15–41)
Albumin: 2.8 g/dL — ABNORMAL LOW (ref 3.5–5.0)
Alkaline Phosphatase: 39 U/L (ref 38–126)
Anion gap: 7 (ref 5–15)
BUN: 24 mg/dL — ABNORMAL HIGH (ref 8–23)
CO2: 29 mmol/L (ref 22–32)
Calcium: 8.1 mg/dL — ABNORMAL LOW (ref 8.9–10.3)
Chloride: 99 mmol/L (ref 98–111)
Creatinine, Ser: 0.72 mg/dL (ref 0.44–1.00)
GFR, Estimated: >60 mL/min (ref >60)
Glucose, Bld: 130 mg/dL — ABNORMAL HIGH (ref 70–99)
Potassium: 4.4 mmol/L (ref 3.5–5.1)
Sodium: 135 mmol/L (ref 135–145)
Total Bilirubin: 0.9 mg/dL (ref 0.3–1.2)
Total Protein: 5.7 g/dL — ABNORMAL LOW (ref 6.5–8.1)

## 2020-08-28 LAB — CBC
HCT: 29.5 % — ABNORMAL LOW (ref 36.0–46.0)
Hemoglobin: 9.1 g/dL — ABNORMAL LOW (ref 12.0–15.0)
MCH: 30 pg (ref 26.0–34.0)
MCHC: 30.8 g/dL (ref 30.0–36.0)
MCV: 97.4 fL (ref 80.0–100.0)
Platelets: 125 10*3/uL — ABNORMAL LOW (ref 150–400)
RBC: 3.03 MIL/uL — ABNORMAL LOW (ref 3.87–5.11)
RDW: 13.8 % (ref 11.5–15.5)
WBC: 9.2 10*3/uL (ref 4.0–10.5)
nRBC: 0 % (ref 0.0–0.2)

## 2020-08-28 LAB — MAGNESIUM: Magnesium: 1.9 mg/dL (ref 1.7–2.4)

## 2020-08-28 NOTE — Evaluation (Signed)
Physical Therapy Evaluation Patient Details Name: [name] MRN: [id number] DOB: [birth year] Today's Date: 08/28/2020   History of Present Illness  [name] is an 87 y.o. female with medical history significant for atrial fibrillation, hypertension, hyperlipidemia, GERD, gait instability (ambulates with a walker) who presents to the emergency department via EMS due to fall sustained around 2:30 AM today.  Patient states that she got up at night to use the restroom and she came back to bed, shortly after this, she got up again for an unknown reason and sustained a fall.  Son at bedside, helped her back to bed, this morning when the home health aide tried to assist her to go to the bathroom, she was unable to get up due to left hip pain.  EMS was activated and patient was taken to the ED for further evaluation and management.  Pt had a Lt hip fx which was operated on 08/27/2020. PT is to follow posterior precautions with WBAT, abduction pillow in when pt is in the bed.  Clinical Impression  [name] is a pleasant 87 yo female who is agreeable to attempt therapy.  Today's treatment was limited by lethargy and pain but overall pt did very well and attempted to assist therapist with all activities.  She will need Skilled nursing placement for both PT and OT services as pt has been falling at home whenever she is standing with her walker and reaches beyond her base of support.     Follow Up Recommendations SNF    Equipment Recommendations  None recommended by PT    Recommendations for Other Services   OT    Precautions / Restrictions Precautions Precautions: Posterior Hip Restrictions Weight Bearing Restrictions: Yes LLE Weight Bearing: Weight bearing as tolerated      Mobility  Bed Mobility Overal bed mobility: Needs Assistance Bed Mobility: Supine to Sit;Sit to Supine     Supine to sit: Max assist Sit to supine: Max assist   General bed mobility comments: Pt able to sit on  the edge of the bed I.  Attempted sit to stand but unable at this time.                     Pertinent Vitals/Pain Pain Assessment: 0-10 Pain Score: 5  Pain Location: Lt hip with activity Pain Descriptors / Indicators: Aching;Sharp Pain Intervention(s): Limited activity within patient's tolerance;Monitored during session    Home Living Family/patient expects to be discharged to:: [place]: Other relatives Available Help at Discharge: Family;Personal care attendant Type of Home: Mobile home Home Access: Ramped entrance     Home Layout: One level Home Equipment: [place] - 4 wheels;Bedside commode;Hospital bed;Wheelchair - manual Additional Comments: PT lives with her son; per daughter pt has been falling alot whenever she reaches outside of her base of support    Prior Function Level of Independence: Needs assistance   Gait / Transfers Assistance Needed: ambulates to the bathroom and back on her own, if she needs to go further she needs assistance for safety  ADL's / Homemaking Assistance Needed: aide assists        Hand Dominance   Dominant Hand: Right    Extremity/Trunk Assessment   Upper Extremity Assessment Upper Extremity Assessment: Generalized weakness    Lower Extremity Assessment Lower Extremity Assessment: Generalized weakness       Communication   Communication: No difficulties  Cognition Arousal/Alertness: Awake/alert Behavior During Therapy: WFL for tasks assessed/performed Overall Cognitive  Status: Within Functional Limits for tasks assessed                                        General Comments      Exercises General Exercises - Lower Extremity Ankle Circles/Pumps: Both;AAROM;10 reps Quad Sets: Both;10 reps Gluteal Sets: Both;10 reps Long Arc Quad: Both;5 reps Heel Slides: Both;AAROM;5 reps Hip ABduction/ADduction: Both;5 reps;AAROM Straight Leg Raises: AAROM;5 reps;Right UE :   Shoulder flexion and B scapular protraction/retraction    Assessment/Plan    PT Assessment Patient needs continued PT services  PT Problem List Decreased strength;Decreased range of motion;Decreased activity tolerance;Decreased balance;Decreased mobility;Pain;Decreased skin integrity       PT Treatment Interventions Gait training;Therapeutic exercise;Therapeutic activities    PT Goals (Current goals can be found in the Care Plan section)  Acute Rehab PT Goals Patient Stated Goal: to walk again PT Goal Formulation: With patient/family Time For Goal Achievement: 09/11/20 Potential to Achieve Goals: Good    Frequency Min 6X/week   Barriers to discharge        Co-evaluation               AM-PAC PT "6 Clicks" Mobility  Outcome Measure Help needed turning from your back to your side while in a flat bed without using bedrails?: A Lot Help needed moving from lying on your back to sitting on the side of a flat bed without using bedrails?: A Lot Help needed moving to and from a bed to a chair (including a wheelchair)?: Total Help needed standing up from a chair using your arms (e.g., wheelchair or bedside chair)?: Total Help needed to walk in hospital room?: Total Help needed climbing 3-5 steps with a railing? : Total 6 Click Score: 8    End of Session   Activity Tolerance: Patient tolerated treatment well;Patient limited by pain Patient left: in bed;with call bell/phone within reach;with nursing/sitter in room;with bed alarm set   PT Visit Diagnosis: Unsteadiness on feet (R26.81);Other abnormalities of gait and mobility (R26.89);Repeated falls (R29.6);Muscle weakness (generalized) (M62.81);History of falling (Z91.81);Pain Pain - Right/Left: Left Pain - part of body: Hip    Time: 8250-5397 PT Time Calculation (min) (ACUTE ONLY): 63 min   Charges:   PT Evaluation $PT Eval Moderate Complexity: 1 Mod PT Treatments $Therapeutic Exercise: 8-22 mins $Therapeutic Activity:  8-22 mins         Rayetta Humphrey, PT CLT [phone] 08/28/2020, 11:41 AM

## 2020-08-28 NOTE — Progress Notes (Signed)
   ORTHOPAEDIC PROGRESS NOTE  Procedure(s): left hip hemiarthroplasty for a displaced femoral neck fracture  DOS: 08/27/20  SUBJECTIVE: Resting comfortably over night. Doing well, better than prior to surgery, according to family in the room.  She is a little confused, but easily redirects.   OBJECTIVE: PE:  Confused, but answers simple questions.    Toes warm and well perfused, 2+ DP pulse Active motion intact to EHL/TA/GS Decreased sensation to the left foot, globally No bruising over the lateral thigh Hip abduction pillow in place. Dressing over lateral hip is clean, dry and intact.   Vitals:   08/28/20 0400 08/28/20 0700  BP: (!) 95/59   Pulse: (!) 102   Resp: 16   Temp: 98 F (36.7 C) 99.1 F (37.3 C)  SpO2: 99%      ASSESSMENT: [name] is a 87 y.o. female doing well postop.  More comfortable following surgery.    PLAN: Weightbearing: WBAT LLE; posterior hip precautions.  Insicional and dressing care: Reinforce dressings as needed Orthopedic device(s): Hip abduction pillow VTE prophylaxis: to be determined following surgery; plan for aspirin 81 mg BID due to fall risk  Pain control: PRN; patient has a history of narcotic use Follow - up plan: 2 weeks PT/OT consult  Contact information:     Cortlyn Cannell A. Amedeo Kinsman, MD [facility] [address] [place],  [place]  [address] Phone: [phone] Fax: [phone]

## 2020-08-28 NOTE — Plan of Care (Signed)
  Problem: Acute Rehab PT Goals(only PT should resolve) Goal: Pt Will Go Supine/Side To Sit Flowsheets (Taken 08/28/2020 1137) Pt will go Supine/Side to Sit: with minimal assist Goal: Pt Will Go Sit To Supine/Side Flowsheets (Taken 08/28/2020 1137) Pt will go Sit to Supine/Side: with minimal assist Goal: Pt Will Transfer Bed To Chair/Chair To Bed Flowsheets (Taken 08/28/2020 1137) Pt will Transfer Bed to Chair/Chair to Bed:  with mod assist  with min assist Goal: Pt Will Ambulate Flowsheets (Taken 08/28/2020 1137) Pt will Ambulate:  25 feet  with minimal assist

## 2020-08-28 NOTE — Progress Notes (Signed)
No CPAP being used at this time. No unit in room.

## 2020-08-28 NOTE — Progress Notes (Signed)
OT Cancellation Note  Patient Details Name: [name] MRN: [id number] DOB: 03-Sep-1933   Cancelled Treatment:    Reason Eval/Treat Not Completed: Medical issues which prohibited therapy.  Will await new orders from Ortho MD after surgery.  Beatris Belen OT, MOT  Larey Seat 08/28/2020, 8:11 AM

## 2020-08-28 NOTE — Progress Notes (Signed)
PROGRESS NOTE   [name]  WUJ:[id number] DOB: [birth year] DOA: 08/25/2020 PCP: Donita Brooks, MD   Chief Complaint  Patient presents with  . Fall   Level of care: Stepdown  Brief Admission History:   87 y.o. female with medical history significant for atrial fibrillation, hypertension, hyperlipidemia, GERD, gait instability (ambulates with a walker) who presents to the emergency department via EMS due to fall sustained around 2:30 AM today.  Patient states that she got up at night to use the restroom and she came back to bed, shortly after this, she got up again for an unknown reason and sustained a fall.  Son at bedside, helped her back to bed, this morning when the home health aide tried to assist her to go to the bathroom, she was unable to get up due to left hip pain.  EMS was activated and patient was taken to the ED for further evaluation and management. Xrays confirmed left hip fracture.    Assessment & Plan:   Principal Problem:   Closed left hip fracture (HCC) Active Problems:   Hyperlipidemia   Essential hypertension   OSA (obstructive sleep apnea)   Pre-diabetes   Fall at home, initial encounter   Atrial fibrillation, chronic (HCC)   Gait abnormality   Leukocytosis   Hyperglycemia   Elevated brain natriuretic peptide (BNP) level   GERD (gastroesophageal reflux disease)  Acute left subcapital hip fracture due to fall at home - Pt is POD #1 s/p operative repair on 4/28.  Post op pain management and DVT prophylaxis per orthopedics.  Pt in stepdown ICU to to high MEWS score.    Chronic pain with acute exacerbation from recent hip fracture - pain has been difficult to control - postop pain mgmt per ortho orders.    Atrial fibrillation - chronic - HR has been controlled with atenolol.  She is not fully anticoagulated due to frequent falling.  Has been on aspirin.  Cardiology consulted for preoperative evaluation.   Resumed home atenolol.    GERD - protonix ordered  for GI protection.   OSA - noncompliant with home CPAP use due to damaged equipment.  Outpatient follow up with pulmonary clinic for repeat sleep study, etc.   Hyperlipidemia - well controlled on crestor 20 mg daily.   Leukocytosis - reactive due to acute fracture.  CBC in AM.   Hypoxic respiratory failure - supplemental oxygen 2L min started at admission, wean as able.   DNR - not present on admission. After discussions with palliative care, family elects DNR status. Golden rod and DNR order completed.   DVT prophylaxis: SQ  Heparin, SCD Code Status: Full  Family Communication: son, daughter at bedside  Disposition: anticipating SNF Status is: Inpatient  Remains inpatient appropriate because:IV treatments appropriate due to intensity of illness or inability to take PO and Inpatient level of care appropriate due to severity of illness  Dispo: The patient is from: Home              Anticipated d/c is to: SNF              Patient currently is not medically stable to d/c.   Difficult to place patient No Consultants:   Cardiology  Orthopedics  palliative  Procedures:   n/a  Antimicrobials:  n/a   Subjective: Pt remains confused but appears comfortable, family at bedside.    Objective: Vitals:   08/28/20 0900 08/28/20 1000 08/28/20 1221 08/28/20 1300  BP: 111/69 (!) 95/58  Pulse: 100 95  89  Resp: 18 11  12   Temp:   97.8 F (36.6 C)   TempSrc:   Oral   SpO2: 100% 100%  100%  Weight:      Height:        Intake/Output Summary (Last 24 hours) at 08/28/2020 1426 Last data filed at 08/28/2020 0500 Gross per 24 hour  Intake 2106.6 ml  Output 550 ml  Net 1556.6 ml   Filed Weights   08/27/20 1455  Weight: 86.1 kg   Examination:  General exam: Pt resting comfortably, no distress, Appears calm and comfortable  Respiratory system: Clear to auscultation. Respiratory effort normal. Cardiovascular system: normal S1 & S2 heard. No JVD, murmurs, rubs, gallops or  clicks. No pedal edema. Gastrointestinal system: Abdomen is nondistended, soft and nontender. No organomegaly or masses felt. Normal bowel sounds heard. Central nervous system: Alert and oriented. No focal neurological deficits. Extremities: bandages clean dry and intact. Palpable pulses BLEs.  Skin: No rashes, lesions or ulcers Psychiatry: Judgement and insight appear poor. Mood & affect appropriate.   Data Reviewed: I have personally reviewed following labs and imaging studies  CBC: Recent Labs  Lab 08/25/20 1610 08/26/20 0459 08/27/20 0426 08/28/20 0424  WBC 11.3* 12.4* 10.4 9.2  NEUTROABS 8.6*  --   --   --   HGB 13.1 12.3 11.7* 9.1*  HCT 43.0 40.3 39.5 29.5*  MCV 96.6 97.6 98.0 97.4  PLT 169 148* 137* 125*    Basic Metabolic Panel: Recent Labs  Lab 08/25/20 1610 08/26/20 0459 08/27/20 0426 08/28/20 0424  NA 134* 138 136 135  K 4.6 4.5 4.4 4.4  CL 98 99 98 99  CO2 28 28 30 29   GLUCOSE 141* 127* 101* 130*  BUN 23 27* 30* 24*  CREATININE 0.96 0.98 0.87 0.72  CALCIUM 8.8* 9.2 8.7* 8.1*  MG  --  1.7 2.2 1.9  PHOS  --  3.2  --   --     GFR: Estimated Creatinine Clearance: 56.7 mL/min (by C-G formula based on SCr of 0.72 mg/dL).  Liver Function Tests: Recent Labs  Lab 08/25/20 1610 08/26/20 0459 08/28/20 0424  AST 31 22 18   ALT 21 17 11   ALKPHOS 52 46 39  BILITOT 1.0 1.2 0.9  PROT 7.1 6.6 5.7*  ALBUMIN 3.6 3.4* 2.8*    CBG: Recent Labs  Lab 08/27/20 1318  GLUCAP 128*    Recent Results (from the past 240 hour(s))  SARS CORONAVIRUS 2 (TAT 6-24 HRS) Nasopharyngeal Nasopharyngeal Swab     Status: None   Collection Time: 08/25/20  5:52 PM   Specimen: Nasopharyngeal Swab  Result Value Ref Range Status   SARS Coronavirus 2 NEGATIVE NEGATIVE Final    Comment: (NOTE) SARS-CoV-2 target nucleic acids are NOT DETECTED.  The SARS-CoV-2 RNA is generally detectable in upper and lower respiratory specimens during the acute phase of infection.  Negative results do not preclude SARS-CoV-2 infection, do not rule out co-infections with other pathogens, and should not be used as the sole basis for treatment or other patient management decisions. Negative results must be combined with clinical observations, patient history, and epidemiological information. The expected result is Negative.  Fact Sheet for Patients: HairSlick.no  Fact Sheet for Healthcare Providers: quierodirigir.com  This test is not yet approved or cleared by the [place] FDA and  has been authorized for detection and/or diagnosis of SARS-CoV-2 by FDA under an Emergency Use Authorization (EUA). This EUA will remain  in  effect (meaning this test can be used) for the duration of the COVID-19 declaration under Se ction 564(b)(1) of the Act, 21 U.S.C. section 360bbb-3(b)(1), unless the authorization is terminated or revoked sooner.  Performed at [facility], [address] [place], [place] [address]   Surgical pcr screen     Status: None   Collection Time: 08/27/20  1:51 AM   Specimen: Nasal Mucosa; Nasal Swab  Result Value Ref Range Status   MRSA, PCR NEGATIVE NEGATIVE Final   Staphylococcus aureus NEGATIVE NEGATIVE Final    Comment: (NOTE) The Xpert SA Assay (FDA approved for NASAL specimens in patients 66 years of age and older), is one component of a comprehensive surveillance program. It is not intended to diagnose infection nor to guide or monitor treatment. Performed at [facility], [address] [place], [place] [address]      Radiology Studies: DG CHEST PORT 1 VIEW  Result Date: 08/27/2020 CLINICAL DATA:  Dyspnea EXAM: PORTABLE CHEST 1 VIEW COMPARISON:  08/26/2020 FINDINGS: Multiple nodular pulmonary infiltrates are developed within the right lung, new since prior examination, possibly infectious or inflammatory in the acute setting. Mild prominence of the pulmonary interstitium  is again noted in keeping with chronic interstitial change. No pneumothorax or pleural effusion. The thoracic aorta is tortuous and ectatic, similar to that noted on prior examination. Cardiac size is stably, mildly enlarged. Pulmonary vascularity is normal. IMPRESSION: Development of multifocal nodular infiltrate within the right lung, possibly infectious or inflammatory in the acute setting. Follow-up chest radiograph in 3-4 weeks is recommended to document resolution. Stable ectasias of the thoracic aorta. Stable cardiomegaly. Electronically Signed   By: Helyn Numbers MD   On: 08/27/2020 04:37   DG CHEST PORT 1 VIEW  Result Date: 08/26/2020 CLINICAL DATA:  Shortness of breath. EXAM: PORTABLE CHEST 1 VIEW COMPARISON:  08/25/2020 FINDINGS: The heart is within normal limits in size. There is stable tortuosity, ectasia and calcification of the thoracic aorta. Mild chronic bronchitic type interstitial lung changes but no infiltrates, edema or effusions. No pulmonary lesions. No pneumothorax. The bony thorax is intact.  Remote healed rib fractures are noted. IMPRESSION: No acute cardiopulmonary findings. Electronically Signed   By: Rudie Meyer M.D.   On: 08/26/2020 17:39   DG HIP UNILAT WITH PELVIS 2-3 VIEWS LEFT  Result Date: 08/27/2020 CLINICAL DATA:  Left hip replacement. EXAM: DG HIP (WITH OR WITHOUT PELVIS) 2-3V LEFT COMPARISON:  Left hip x-rays dated August 25, 2020. FINDINGS: The left hip demonstrates a hemiarthroplasty without evidence of hardware failure or complication. There is no fracture or dislocation. The alignment is anatomic. Post-surgical changes noted in the surrounding soft tissues. IMPRESSION: 1. Left hip hemiarthroplasty without acute postoperative complication. Electronically Signed   By: Obie Dredge M.D.   On: 08/27/2020 13:59   Scheduled Meds: . acetaminophen  650 mg Oral Q6H  . aspirin  81 mg Oral BID  . atenolol  50 mg Oral Daily  . calcium-vitamin D  1 tablet Oral Daily   . Chlorhexidine Gluconate Cloth  6 each Topical Daily  . diclofenac sodium  2 g Topical QID  . DULoxetine  30 mg Oral Daily  . DULoxetine  60 mg Oral Daily  . gabapentin  800 mg Oral TID  . heparin  5,000 Units Subcutaneous Q8H  . pantoprazole  40 mg Oral Daily  . rosuvastatin  40 mg Oral Daily   Continuous Infusions: . sodium chloride 60 mL/hr at 08/28/20 1408  LOS: 3 days   Time spent: 35 mins   Aniketh Huberty Laural Benes, MD How to contact the [facility] Attending or Consulting provider 7A - 7P or covering provider during after hours 7P -7A, for this patient?  1. Check the care team in [facility] and look for a) attending/consulting TR[facility] provider listed and b) the [facility] team listed 2. Log into [email or web address] and use [place]'s universal password to access. If you do not have the password, please contact the hospital operator. 3. Locate the [facility] provider you are looking for under [facility] and page to a number that you can be directly reached. 4. If you still have difficulty reaching the provider, please page the [facility] (Director on Call) for the Hospitalists listed on amion for assistance.  08/28/2020, 2:26 PM

## 2020-08-28 NOTE — Progress Notes (Signed)
Palliative: [name] is lying quietly in bed.  She has just been given pain medicine and is resting comfortably.  She does not wake when I gently call her name, so I will leave her resting.  Present today at bedside is son [name], and daughter [name].   We talked at length about [name] acute and chronic health concerns.  Family seems to be very knowledgeable about her issues.  They share that she has had declines over the last few months/years.  We talked about her functional status.  Family seems accepting of her expected declines sharing they agree that she is likely to continue to follow even at short-term rehab.  We talked about short-term rehab and they share their preferences.  We talked about CODE STATUS.  Family elects "treat the treatable, but allowing natural death".  Orders adjusted.  We talked about outpatient palliative services, family is agreeable to outpatient palliative services with hosp[facility] as their choice.  And, needs, goals of care  Conference with attending, bedside nursing staff, surgeon, transition of care team related to patient condition, needs, goals of care, CODE STATUS, disposition.  Plan: Continue to treat the treatable but no CPR or intubation.  Time for outcomes.  Short-term rehab, [facility] choice [facility] [place], second choice [place].  DNR status.  Outpatient palliative with [facility].  9 minutes Yvonne Axe, NP Palliative medicine team Team phone [phone] Greater than 50% of this time was spent counseling and coordinating care related to the above assessment and plan.

## 2020-08-28 NOTE — NC FL2 (Deleted)
MEDICAID FL2 LEVEL OF CARE SCREENING TOOL     IDENTIFICATION  Patient Name: [name] Birthdate: 12/24/1933 Sex: female Admission Date (Current Location): 08/25/2020  [facility] and [place] Number:  [employer] and Address:  [facility],  [address] [place] [address]      Provider Number: [id number]  Attending Physician Name and Address:  Cleora Fleet, MD  Relative Name and Phone Number:  Lucille Passy)   [phone]    Current Level of Care: Hospital Recommended Level of Care:   Prior Approval Number:    Date Approved/Denied:   PASRR Number: [id number]  Discharge Plan: SNF    Current Diagnoses: Patient Active Problem List   Diagnosis Date Noted  . Closed left hip fracture (HCC) 08/25/2020  . Leukocytosis 08/25/2020  . Hyperglycemia 08/25/2020  . Elevated brain natriuretic peptide (BNP) level 08/25/2020  . GERD (gastroesophageal reflux disease) 08/25/2020  . Cerebrovascular accident (CVA) due to stenosis of basilar artery (HCC) 07/17/2019  . Dyspnea 07/09/2019  . Acute diastolic CHF (congestive heart failure) (HCC) 07/09/2019  . Chest pain   . Coronary artery calcification seen on CT scan   . Gait abnormality 01/24/2019  . Tremor 01/24/2019  . Memory loss 01/24/2019  . Atelectasis of left lung 04/06/2018  . Acute encephalopathy 02/09/2018  . Urinary tract infection due to extended-spectrum beta lactamase (ESBL) producing Escherichia coli 02/02/2018  . Atrial fibrillation, chronic (HCC) 02/02/2018  . Constipation 01/31/2018  . Urolithiasis 01/30/2018  . T12 compression fracture (HCC) 09/04/2017  . Fall at home, initial encounter 07/03/2017  . Tachycardia 02/03/2017  . Chronic pain syndrome 02/02/2017  . Abnormal EKG 11/21/2013  . [name] (abdominal aortic aneurysm) (HCC) 09/30/2013  . OSA (obstructive sleep apnea) 06/29/2012  . Dementia (HCC) 06/29/2012  . Pre-diabetes 06/29/2012  . Hypoxia, sleep related  06/29/2012  . RA (rheumatoid arthritis) (HCC) 06/29/2012  . Nonspecific abnormal results of cardiovascular function study 06/02/2011  . Nonspecific abnormal electrocardiogram (ECG) (EKG) 05/18/2011  . Osteoporosis 04/09/2008  . Hyperlipidemia 05/12/2006  . DEPENDENT EDEMA, LEGS, BILATERAL 05/12/2006  . PERIPHERAL NEUROPATHY 03/11/2006  . Essential hypertension 03/11/2006    Orientation RESPIRATION BLADDER Height & Weight     Self,Place  Normal Incontinent Weight: 189 lb 13.1 oz (86.1 kg) Height:  5\' 5"  (165.1 cm)  BEHAVIORAL SYMPTOMS/MOOD NEUROLOGICAL BOWEL NUTRITION STATUS      Incontinent Diet (heart healthy/carb modified)  AMBULATORY STATUS COMMUNICATION OF NEEDS Skin   Extensive Assist Verbally Normal                       Personal Care Assistance Level of Assistance  Bathing,Dressing,Feeding Bathing Assistance: Limited assistance Feeding assistance: Independent Dressing Assistance: Limited assistance     Functional Limitations Info  Sight,Hearing,Speech Sight Info: Adequate Hearing Info: Adequate Speech Info: Adequate    SPECIAL CARE FACTORS FREQUENCY  PT (By licensed PT)     PT Frequency: 5/xweek              Contractures Contractures Info: Not present    Additional Factors Info  Code Status,Allergies Code Status Info: DNR Allergies Info: Sulfonamide Derivatives           Current Medications (08/28/2020):  This is the current hospital active medication list Current Facility-Administered Medications  Medication Dose Route Frequency Provider Last Rate Last Admin  . 0.9 %  sodium chloride infusion   Intravenous Continuous Oliver Barre, MD   Stopped at 08/27/20 0750  .  acetaminophen (TYLENOL) tablet 650 mg  650 mg Oral Q6H Oliver Barre, MD   650 mg at 08/28/20 0906  . ALPRAZolam Prudy Feeler) tablet 0.25 mg  0.25 mg Oral BID PRN Oliver Barre, MD   0.25 mg at 08/26/20 1811  . aspirin chewable tablet 81 mg  81 mg Oral BID Thane Edu A, MD      .  atenolol (TENORMIN) tablet 50 mg  50 mg Oral Daily Oliver Barre, MD   50 mg at 08/28/20 0905  . calcium-vitamin D (OSCAL WITH D) 500-200 MG-UNIT per tablet 1 tablet  1 tablet Oral Daily Oliver Barre, MD   1 tablet at 08/28/20 0909  . Chlorhexidine Gluconate Cloth 2 % PADS 6 each  6 each Topical Daily Cleora Fleet, MD   6 each at 08/28/20 0908  . diclofenac sodium (VOLTAREN) 1 % transdermal gel 2 g  2 g Topical QID Oliver Barre, MD   2 g at 08/26/20 2340  . DULoxetine (CYMBALTA) DR capsule 30 mg  30 mg Oral Daily Oliver Barre, MD   30 mg at 08/28/20 0908  . DULoxetine (CYMBALTA) DR capsule 60 mg  60 mg Oral Daily Oliver Barre, MD   60 mg at 08/28/20 1610  . gabapentin (NEURONTIN) capsule 800 mg  800 mg Oral TID Oliver Barre, MD   800 mg at 08/28/20 0909  . heparin injection 5,000 Units  5,000 Units Subcutaneous Q8H Oliver Barre, MD   5,000 Units at 08/28/20 0606  . HYDROmorphone (DILAUDID) injection 0.5 mg  0.5 mg Intravenous Q3H PRN Oliver Barre, MD   0.5 mg at 08/28/20 9604  . oxyCODONE (Oxy IR/ROXICODONE) immediate release tablet 7.5 mg  7.5 mg Oral Q6H PRN Oliver Barre, MD      . pantoprazole (PROTONIX) EC tablet 40 mg  40 mg Oral Daily Oliver Barre, MD   40 mg at 08/28/20 0907  . rosuvastatin (CRESTOR) tablet 40 mg  40 mg Oral Daily Oliver Barre, MD   40 mg at 08/28/20 5409     Discharge Medications: Please see discharge summary for a list of discharge medications.  Relevant Imaging Results:  Relevant Lab Results:   Additional Information ssn:[phone]  Maynor Mwangi, Juleen China, LCSW

## 2020-08-28 NOTE — NC FL2 (Signed)
[place] MEDICAID FL2 LEVEL OF CARE SCREENING TOOL     IDENTIFICATION  Patient Name: [name] Birthdate: 1934/04/02 Sex: female Admission Date (Current Location): 08/25/2020  [facility] and [place] Number:  [employer] and Address:  [facility],  [address] [place] [address]      Provider Number: [id number]  Attending Physician Name and Address:  Cleora Fleet, MD  Relative Name and Phone Number:  Lucille Passy)   [phone]    Current Level of Care: Hospital Recommended Level of Care:   Prior Approval Number:    Date Approved/Denied:   PASRR Number: [id number]  Discharge Plan: SNF    Current Diagnoses: Patient Active Problem List   Diagnosis Date Noted  . Closed left hip fracture (HCC) 08/25/2020  . Leukocytosis 08/25/2020  . Hyperglycemia 08/25/2020  . Elevated brain natriuretic peptide (BNP) level 08/25/2020  . GERD (gastroesophageal reflux disease) 08/25/2020  . Cerebrovascular accident (CVA) due to stenosis of basilar artery (HCC) 07/17/2019  . Dyspnea 07/09/2019  . Acute diastolic CHF (congestive heart failure) (HCC) 07/09/2019  . Chest pain   . Coronary artery calcification seen on CT scan   . Gait abnormality 01/24/2019  . Tremor 01/24/2019  . Memory loss 01/24/2019  . Atelectasis of left lung 04/06/2018  . Acute encephalopathy 02/09/2018  . Urinary tract infection due to extended-spectrum beta lactamase (ESBL) producing Escherichia coli 02/02/2018  . Atrial fibrillation, chronic (HCC) 02/02/2018  . Constipation 01/31/2018  . Urolithiasis 01/30/2018  . T12 compression fracture (HCC) 09/04/2017  . Fall at home, initial encounter 07/03/2017  . Tachycardia 02/03/2017  . Chronic pain syndrome 02/02/2017  . Abnormal EKG 11/21/2013  . [name] (abdominal aortic aneurysm) (HCC) 09/30/2013  . OSA (obstructive sleep apnea) 06/29/2012  . Dementia (HCC) 06/29/2012  . Pre-diabetes 06/29/2012  . Hypoxia, sleep related  06/29/2012  . RA (rheumatoid arthritis) (HCC) 06/29/2012  . Nonspecific abnormal results of cardiovascular function study 06/02/2011  . Nonspecific abnormal electrocardiogram (ECG) (EKG) 05/18/2011  . Osteoporosis 04/09/2008  . Hyperlipidemia 05/12/2006  . DEPENDENT EDEMA, LEGS, BILATERAL 05/12/2006  . PERIPHERAL NEUROPATHY 03/11/2006  . Essential hypertension 03/11/2006    Orientation RESPIRATION BLADDER Height & Weight     Self,Place  Normal Incontinent Weight: 189 lb 13.1 oz (86.1 kg) Height:  5\' 5"  (165.1 cm)  BEHAVIORAL SYMPTOMS/MOOD NEUROLOGICAL BOWEL NUTRITION STATUS      Incontinent Diet (heart healthy/carb modified)  AMBULATORY STATUS COMMUNICATION OF NEEDS Skin   Extensive Assist Verbally Surgical wounds (left hip)                       Personal Care Assistance Level of Assistance  Bathing,Dressing,Feeding Bathing Assistance: Limited assistance Feeding assistance: Independent Dressing Assistance: Limited assistance     Functional Limitations Info  Sight,Hearing,Speech Sight Info: Adequate Hearing Info: Adequate Speech Info: Adequate    SPECIAL CARE FACTORS FREQUENCY  PT (By licensed PT)     PT Frequency: 5/xweek              Contractures Contractures Info: Not present    Additional Factors Info  Code Status,Allergies Code Status Info: DNR Allergies Info: Sulfonamide Derivatives           Current Medications (08/28/2020):  This is the current hospital active medication list Current Facility-Administered Medications  Medication Dose Route Frequency Provider Last Rate Last Admin  . 0.9 %  sodium chloride infusion   Intravenous Continuous Oliver Barre, MD   Stopped at  08/27/20 0750  . acetaminophen (TYLENOL) tablet 650 mg  650 mg Oral Q6H Oliver Barre, MD   650 mg at 08/28/20 0906  . ALPRAZolam Prudy Feeler) tablet 0.25 mg  0.25 mg Oral BID PRN Oliver Barre, MD   0.25 mg at 08/26/20 1811  . aspirin chewable tablet 81 mg  81 mg Oral BID Thane Edu A, MD      . atenolol (TENORMIN) tablet 50 mg  50 mg Oral Daily Oliver Barre, MD   50 mg at 08/28/20 0905  . calcium-vitamin D (OSCAL WITH D) 500-200 MG-UNIT per tablet 1 tablet  1 tablet Oral Daily Oliver Barre, MD   1 tablet at 08/28/20 0909  . Chlorhexidine Gluconate Cloth 2 % PADS 6 each  6 each Topical Daily Cleora Fleet, MD   6 each at 08/28/20 0908  . diclofenac sodium (VOLTAREN) 1 % transdermal gel 2 g  2 g Topical QID Oliver Barre, MD   2 g at 08/26/20 2340  . DULoxetine (CYMBALTA) DR capsule 30 mg  30 mg Oral Daily Oliver Barre, MD   30 mg at 08/28/20 0908  . DULoxetine (CYMBALTA) DR capsule 60 mg  60 mg Oral Daily Oliver Barre, MD   60 mg at 08/28/20 1610  . gabapentin (NEURONTIN) capsule 800 mg  800 mg Oral TID Oliver Barre, MD   800 mg at 08/28/20 0909  . heparin injection 5,000 Units  5,000 Units Subcutaneous Q8H Oliver Barre, MD   5,000 Units at 08/28/20 0606  . HYDROmorphone (DILAUDID) injection 0.5 mg  0.5 mg Intravenous Q3H PRN Oliver Barre, MD   0.5 mg at 08/28/20 9604  . oxyCODONE (Oxy IR/ROXICODONE) immediate release tablet 7.5 mg  7.5 mg Oral Q6H PRN Oliver Barre, MD      . pantoprazole (PROTONIX) EC tablet 40 mg  40 mg Oral Daily Oliver Barre, MD   40 mg at 08/28/20 0907  . rosuvastatin (CRESTOR) tablet 40 mg  40 mg Oral Daily Oliver Barre, MD   40 mg at 08/28/20 5409     Discharge Medications: Please see discharge summary for a list of discharge medications.  Relevant Imaging Results:  Relevant Lab Results:   Additional Information ssn:[phone]  Gerald Honea, Juleen China, LCSW

## 2020-08-28 NOTE — TOC Initial Note (Signed)
Transition of Care [facility]) - Initial/Assessment Note    Patient Details  Name: [name] MRN: [id number] Date of Birth: 08-Mar-1934  Transition of Care [facility]) CM/SW Contact:    Ihor Gully, LCSW Phone Number: 08/28/2020, 2:42 PM  Clinical Narrative:                 Patient from home with 2 sons, Audelia Acton and Kasandra Knudsen and her 2 stepsons.  She has 2 daughters who also live nearby.  She has good family support.  She has a CAP aide to help with personal needs 5 days/week, for several years now. Per palliative care NP family wishes for patient to go to short term rehab. [facility] is first choice and [facility] is second choice. Referrals made.   Expected Discharge Plan: Skilled Nursing Facility Barriers to Discharge: Continued Medical Work up   Patient Goals and CMS Choice Patient states their goals for this hospitalization and ongoing recovery are:: rehab      Expected Discharge Plan and Services Expected Discharge Plan: [place]       Living arrangements for the past 2 months: Single Family Home                                      Prior Living Arrangements/Services Living arrangements for the past 2 months: Single Family Home Lives with:: Adult Children Patient language and need for interpreter reviewed:: Yes        Need for Family Participation in Patient Care: Yes (Comment) Care giver support system in place?: Yes (comment)   Criminal Activity/Legal Involvement Pertinent to Current Situation/Hospitalization: No - Comment as needed  Activities of Daily Living Home Assistive Devices/Equipment: None ADL Screening (condition at time of admission) Patient's cognitive ability adequate to safely complete daily activities?: Yes Is the patient deaf or have difficulty hearing?: No Does the patient have difficulty seeing, even when wearing glasses/contacts?: No Does the patient have difficulty concentrating, remembering, or making  decisions?: Yes Patient able to express need for assistance with ADLs?: Yes Does the patient have difficulty dressing or bathing?: No Independently performs ADLs?: Yes (appropriate for developmental age) Does the patient have difficulty walking or climbing stairs?: No Weakness of Legs: None Weakness of Arms/Hands: None  Permission Sought/Granted                  Emotional Assessment Appearance:: Appears stated age       Alcohol / Substance Use: Not Applicable Psych Involvement: No (comment)  Admission diagnosis:  Closed left hip fracture ([place]) [S72.002A] Closed fracture of left hip, initial encounter ([place]) [S72.002A] Patient Active Problem List   Diagnosis Date Noted  . Closed left hip fracture ([place]) 08/25/2020  . Leukocytosis 08/25/2020  . Hyperglycemia 08/25/2020  . Elevated brain natriuretic peptide (BNP) level 08/25/2020  . GERD (gastroesophageal reflux disease) 08/25/2020  . Cerebrovascular accident (CVA) due to stenosis of basilar artery ([place]) 07/17/2019  . Dyspnea 07/09/2019  . Acute diastolic CHF (congestive heart failure) ([place]) 07/09/2019  . Chest pain   . Coronary artery calcification seen on CT scan   . Gait abnormality 01/24/2019  . Tremor 01/24/2019  . Memory loss 01/24/2019  . Atelectasis of left lung 04/06/2018  . Acute encephalopathy 02/09/2018  . Urinary tract infection due to extended-spectrum beta lactamase (ESBL) producing Escherichia coli 02/02/2018  . Atrial fibrillation, chronic ([place]) 02/02/2018  . Constipation 01/31/2018  .  Urolithiasis 01/30/2018  . T12 compression fracture ([place]) 09/04/2017  . Fall at home, initial encounter 07/03/2017  . Tachycardia 02/03/2017  . Chronic pain syndrome 02/02/2017  . Abnormal EKG 11/21/2013  . [name] (abdominal aortic aneurysm) ([place]) 09/30/2013  . OSA (obstructive sleep apnea) 06/29/2012  . Dementia ([place]) 06/29/2012  . Pre-diabetes 06/29/2012  . Hypoxia, sleep related 06/29/2012  . RA (rheumatoid  arthritis) ([place]) 06/29/2012  . Nonspecific abnormal results of cardiovascular function study 06/02/2011  . Nonspecific abnormal electrocardiogram (ECG) (EKG) 05/18/2011  . Osteoporosis 04/09/2008  . Hyperlipidemia 05/12/2006  . DEPENDENT EDEMA, LEGS, BILATERAL 05/12/2006  . PERIPHERAL NEUROPATHY 03/11/2006  . Essential hypertension 03/11/2006   PCP:  [name], MD Pharmacy:   [facility], [place] [address] Phone: [phone] Fax: [phone]  [id number] - [place], [place] - [address] [place] [address] Phone: [phone] Fax: [phone]     Social Determinants of Health (SDOH) Interventions    Readmission Risk Interventions No flowsheet data found.

## 2020-08-29 LAB — CBC
HCT: 32.3 % — ABNORMAL LOW (ref 36.0–46.0)
Hemoglobin: 9.6 g/dL — ABNORMAL LOW (ref 12.0–15.0)
MCH: 29.4 pg (ref 26.0–34.0)
MCHC: 29.7 g/dL — ABNORMAL LOW (ref 30.0–36.0)
MCV: 98.8 fL (ref 80.0–100.0)
Platelets: 139 10*3/uL — ABNORMAL LOW (ref 150–400)
RBC: 3.27 MIL/uL — ABNORMAL LOW (ref 3.87–5.11)
RDW: 13.7 % (ref 11.5–15.5)
WBC: 7.6 10*3/uL (ref 4.0–10.5)
nRBC: 0 % (ref 0.0–0.2)

## 2020-08-29 LAB — COMPREHENSIVE METABOLIC PANEL
ALT: 10 U/L (ref 0–44)
AST: 24 U/L (ref 15–41)
Albumin: 2.9 g/dL — ABNORMAL LOW (ref 3.5–5.0)
Alkaline Phosphatase: 43 U/L (ref 38–126)
Anion gap: 6 (ref 5–15)
BUN: 19 mg/dL (ref 8–23)
CO2: 30 mmol/L (ref 22–32)
Calcium: 8.3 mg/dL — ABNORMAL LOW (ref 8.9–10.3)
Chloride: 101 mmol/L (ref 98–111)
Creatinine, Ser: 0.64 mg/dL (ref 0.44–1.00)
GFR, Estimated: >60 mL/min (ref >60)
Glucose, Bld: 95 mg/dL (ref 70–99)
Potassium: 4.4 mmol/L (ref 3.5–5.1)
Sodium: 137 mmol/L (ref 135–145)
Total Bilirubin: 1 mg/dL (ref 0.3–1.2)
Total Protein: 6.1 g/dL — ABNORMAL LOW (ref 6.5–8.1)

## 2020-08-29 LAB — BPAM RBC
Blood Product Expiration Date: 202205042359
Unit Type and Rh: 600

## 2020-08-29 LAB — TYPE AND SCREEN
ABO/RH(D): A POS
Antibody Screen: NEGATIVE
Unit division: 0

## 2020-08-29 LAB — MAGNESIUM: Magnesium: 2 mg/dL (ref 1.7–2.4)

## 2020-08-29 NOTE — Progress Notes (Signed)
PROGRESS NOTE   [name]  ZOX:[id number] DOB: [birth year] DOA: 08/25/2020 PCP: Donita Brooks, MD   Chief Complaint  Patient presents with  . Fall   Level of care: Med-Surg  Brief Admission History:   87 y.o. female with medical history significant for atrial fibrillation, hypertension, hyperlipidemia, GERD, gait instability (ambulates with a walker) who presents to the emergency department via EMS due to fall sustained around 2:30 AM today.  Patient states that she got up at night to use the restroom and she came back to bed, shortly after this, she got up again for an unknown reason and sustained a fall.  Son at bedside, helped her back to bed, this morning when the home health aide tried to assist her to go to the bathroom, she was unable to get up due to left hip pain.  EMS was activated and patient was taken to the ED for further evaluation and management. Xrays confirmed left hip fracture.    Assessment & Plan:   Principal Problem:   Closed left hip fracture (HCC) Active Problems:   Hyperlipidemia   Essential hypertension   OSA (obstructive sleep apnea)   Pre-diabetes   Fall at home, initial encounter   Atrial fibrillation, chronic (HCC)   Gait abnormality   Leukocytosis   Hyperglycemia   Elevated brain natriuretic peptide (BNP) level   GERD (gastroesophageal reflux disease)  Acute left subcapital hip fracture due to fall at home - Pt is POD #2 s/p operative repair on 4/28.  Post op pain management and DVT prophylaxis per orthopedics.  Transfer to med surg today.    Chronic pain with acute exacerbation from recent hip fracture - postop pain mgmt per ortho orders.    Atrial fibrillation - chronic - HR has been controlled with atenolol.  She is not fully anticoagulated due to frequent falling.  Has been on aspirin.  Cardiology consulted for preoperative evaluation.   Resumed home atenolol.    GERD - protonix ordered for GI protection.   OSA - noncompliant with home  CPAP use due to damaged equipment.  Outpatient follow up with pulmonary clinic for repeat sleep study, etc.   Hyperlipidemia - well controlled on crestor 20 mg daily.   Leukocytosis - reactive due to acute fracture.  CBC Normalized now.    Hypoxic respiratory failure - supplemental oxygen 2L min started at admission, wean as able.   DNR - not present on admission. After discussions with palliative care, family elects DNR status. [place] and DNR order completed.   DVT prophylaxis: SQ  Heparin, SCD Code Status: Full  Family Communication: son, daughter at bedside  Disposition: awaiting SNF placement Status is: Inpatient  Remains inpatient appropriate because:IV treatments appropriate due to intensity of illness or inability to take PO and Inpatient level of care appropriate due to severity of illness  Dispo: The patient is from: Home              Anticipated d/c is to: SNF              Patient currently is not medically stable to d/c.   Difficult to place patient No Consultants:   Cardiology  Orthopedics  palliative  Procedures:   n/a  Antimicrobials:  n/a   Subjective: Pt says she will eat some breakfast today.    Objective: Vitals:   08/29/20 0700 08/29/20 0800 08/29/20 0812 08/29/20 1200  BP:      Pulse: 95 86    Resp: 13  15    Temp:   99.2 F (37.3 C) 98.4 F (36.9 C)  TempSrc:   Oral Oral  SpO2: 98% 100%    Weight:      Height:        Intake/Output Summary (Last 24 hours) at 08/29/2020 1418 Last data filed at 08/29/2020 0949 Gross per 24 hour  Intake 706 ml  Output 850 ml  Net -144 ml   Filed Weights   08/27/20 1455 08/29/20 0407  Weight: 86.1 kg 85 kg   Examination:  General exam: Pt resting comfortably, no distress, Appears calm and comfortable. She has dementia.  Respiratory system: Clear to auscultation. Respiratory effort normal. Cardiovascular system: normal S1 & S2 heard. No JVD, murmurs, rubs, gallops or clicks. No pedal  edema. Gastrointestinal system: Abdomen is nondistended, soft and nontender. No organomegaly or masses felt. Normal bowel sounds heard. Central nervous system: Alert and oriented. No focal neurological deficits. Extremities: bandages clean dry and intact. Palpable pulses BLEs.  Skin: No rashes, lesions or ulcers Psychiatry: Judgement and insight appear poor. Mood & affect appropriate.   Data Reviewed: I have personally reviewed following labs and imaging studies  CBC: Recent Labs  Lab 08/25/20 1610 08/26/20 0459 08/27/20 0426 08/28/20 0424 08/29/20 0520  WBC 11.3* 12.4* 10.4 9.2 7.6  NEUTROABS 8.6*  --   --   --   --   HGB 13.1 12.3 11.7* 9.1* 9.6*  HCT 43.0 40.3 39.5 29.5* 32.3*  MCV 96.6 97.6 98.0 97.4 98.8  PLT 169 148* 137* 125* 139*    Basic Metabolic Panel: Recent Labs  Lab 08/25/20 1610 08/26/20 0459 08/27/20 0426 08/28/20 0424 08/29/20 0520  NA 134* 138 136 135 137  K 4.6 4.5 4.4 4.4 4.4  CL 98 99 98 99 101  CO2 28 28 30 29 30   GLUCOSE 141* 127* 101* 130* 95  BUN 23 27* 30* 24* 19  CREATININE 0.96 0.98 0.87 0.72 0.64  CALCIUM 8.8* 9.2 8.7* 8.1* 8.3*  MG  --  1.7 2.2 1.9 2.0  PHOS  --  3.2  --   --   --     GFR: Estimated Creatinine Clearance: 56.4 mL/min (by C-G formula based on SCr of 0.64 mg/dL).  Liver Function Tests: Recent Labs  Lab 08/25/20 1610 08/26/20 0459 08/28/20 0424 08/29/20 0520  AST 31 22 18 24   ALT 21 17 11 10   ALKPHOS 52 46 39 43  BILITOT 1.0 1.2 0.9 1.0  PROT 7.1 6.6 5.7* 6.1*  ALBUMIN 3.6 3.4* 2.8* 2.9*    CBG: Recent Labs  Lab 08/27/20 1318  GLUCAP 128*    Recent Results (from the past 240 hour(s))  SARS CORONAVIRUS 2 (TAT 6-24 HRS) Nasopharyngeal Nasopharyngeal Swab     Status: None   Collection Time: 08/25/20  5:52 PM   Specimen: Nasopharyngeal Swab  Result Value Ref Range Status   SARS Coronavirus 2 NEGATIVE NEGATIVE Final    Comment: (NOTE) SARS-CoV-2 target nucleic acids are NOT DETECTED.  The SARS-CoV-2  RNA is generally detectable in upper and lower respiratory specimens during the acute phase of infection. Negative results do not preclude SARS-CoV-2 infection, do not rule out co-infections with other pathogens, and should not be used as the sole basis for treatment or other patient management decisions. Negative results must be combined with clinical observations, patient history, and epidemiological information. The expected result is Negative.  Fact Sheet for Patients: HairSlick.no  Fact Sheet for Healthcare Providers: quierodirigir.com  This test is not  yet approved or cleared by the [place] and  has been authorized for detection and/or diagnosis of SARS-CoV-2 by FDA under an Emergency Use Authorization (EUA). This EUA will remain  in effect (meaning this test can be used) for the duration of the COVID-19 declaration under Se ction 564(b)(1) of the Act, 21 U.S.C. section 360bbb-3(b)(1), unless the authorization is terminated or revoked sooner.  Performed at [facility], [address], [place], [place] [address]   Surgical pcr screen     Status: None   Collection Time: 08/27/20  1:51 AM   Specimen: Nasal Mucosa; Nasal Swab  Result Value Ref Range Status   MRSA, PCR NEGATIVE NEGATIVE Final   Staphylococcus aureus NEGATIVE NEGATIVE Final    Comment: (NOTE) The Xpert SA Assay (FDA approved for NASAL specimens in patients 53 years of age and older), is one component of a comprehensive surveillance program. It is not intended to diagnose infection nor to guide or monitor treatment. Performed at [facility], [address] [place], [place] [address]     Radiology Studies: No results found. Scheduled Meds: . acetaminophen  650 mg Oral Q6H  . aspirin  81 mg Oral BID  . atenolol  50 mg Oral Daily  . calcium-vitamin D  1 tablet Oral Daily  . Chlorhexidine Gluconate Cloth  6 each Topical Daily  .  diclofenac sodium  2 g Topical QID  . DULoxetine  30 mg Oral Daily  . DULoxetine  60 mg Oral Daily  . gabapentin  800 mg Oral TID  . heparin  5,000 Units Subcutaneous Q8H  . pantoprazole  40 mg Oral Daily  . rosuvastatin  40 mg Oral Daily   Continuous Infusions: . sodium chloride 30 mL/hr at 08/29/20 1346    LOS: 4 days   Time spent: 35 mins   Jadi Deyarmin Laural Benes, MD How to contact the [facility] Attending or Consulting provider 7A - 7P or covering provider during after hours 7P -7A, for this patient?  1. Check the care team in [facility] and look for a) attending/consulting TR[facility] provider listed and b) the [facility] team listed 2. Log into [email or web address] and use [place]'s universal password to access. If you do not have the password, please contact the hospital operator. 3. Locate the [facility] provider you are looking for under [facility] and page to a number that you can be directly reached. 4. If you still have difficulty reaching the provider, please page the [facility] (Director on Call) for the Hospitalists listed on amion for assistance.  08/29/2020, 2:18 PM

## 2020-08-29 NOTE — Progress Notes (Signed)
   ORTHOPAEDIC PROGRESS NOTE  Procedure(s): left hip hemiarthroplasty for a displaced femoral neck fracture  DOS: 08/27/20  SUBJECTIVE: Resting comfortably.  No issues over night.  Pain in hip is improved.  No family at the bedside.  OBJECTIVE: PE:  Answers simple questions.    Toes warm and well perfused, 2+ DP pulse Active motion intact to EHL/TA/GS Decreased sensation to the left foot, globally No bruising over the lateral thigh Hip abduction pillow in place. Dressing over lateral hip is clean, dry and intact.   Vitals:   08/29/20 0600 08/29/20 0700  BP:    Pulse: 96 95  Resp: 16 13  Temp:    SpO2: 99% 98%     ASSESSMENT: [name] is a 87 y.o. female doing well postop.  Stable.  PT recommends SNF, waiting for placement   PLAN: Weightbearing: WBAT LLE; posterior hip precautions.  Insicional and dressing care: Reinforce dressings as needed Orthopedic device(s): Hip abduction pillow VTE prophylaxis: to be determined following surgery; plan for aspirin 81 mg BID due to fall risk  Pain control: PRN; patient has a history of narcotic use Follow - up plan: 2 weeks PT/OT consult  Contact information:     Tonnie Friedel A. Amedeo Kinsman, MD [place] [address] [place],  [place]  [address] Phone: [phone] Fax: [phone]

## 2020-08-30 NOTE — Plan of Care (Signed)

## 2020-08-30 NOTE — Progress Notes (Signed)
Pt is very agitated with IV line this shift. Continued to pull lines and had to redirect her that it was to help keep vein access if needed for medications ordered. Pt was not having education and writer stopped KVO fluids to calm patient down and allow her to get needed rest to participate in therapy sessions as ordered. Xanax given at this time.

## 2020-08-30 NOTE — Progress Notes (Signed)
PROGRESS NOTE   [name]  XBM:[id number] DOB: [birth year] DOA: 08/25/2020 PCP: Donita Brooks, MD   Chief Complaint  Patient presents with  . Fall   Level of care: Med-Surg  Brief Admission History:   87 y.o. female with medical history significant for atrial fibrillation, hypertension, hyperlipidemia, GERD, gait instability (ambulates with a walker) who presents to the emergency department via EMS due to fall sustained around 2:30 AM today.  Patient states that she got up at night to use the restroom and she came back to bed, shortly after this, she got up again for an unknown reason and sustained a fall.  Son at bedside, helped her back to bed, this morning when the home health aide tried to assist her to go to the bathroom, she was unable to get up due to left hip pain.  EMS was activated and patient was taken to the ED for further evaluation and management. Xrays confirmed left hip fracture.    Assessment & Plan:   Principal Problem:   Closed left hip fracture (HCC) Active Problems:   Hyperlipidemia   Essential hypertension   OSA (obstructive sleep apnea)   Pre-diabetes   Fall at home, initial encounter   Atrial fibrillation, chronic (HCC)   Gait abnormality   Leukocytosis   Hyperglycemia   Elevated brain natriuretic peptide (BNP) level   GERD (gastroesophageal reflux disease)  Acute left subcapital hip fracture due to fall at home - Pt is POD #3 s/p operative repair on 4/28.  Post op pain management and DVT prophylaxis per orthopedics.  Pt is awaiting SNF bed placement.     Chronic pain with acute exacerbation from recent hip fracture - postop pain mgmt per ortho orders. Pain seems to be better controlled today.    Atrial fibrillation - chronic - HR has been controlled with atenolol.  She is not fully anticoagulated due to frequent falling.  Has been on aspirin.  Cardiology consulted for preoperative evaluation.   Resumed home atenolol.    GERD - protonix ordered  for GI protection.   OSA - noncompliant with home CPAP use due to damaged equipment.  Outpatient follow up with pulmonary clinic for repeat sleep study, etc.   Hyperlipidemia - well controlled on crestor 20 mg daily.   Leukocytosis - reactive due to acute fracture.  CBC Normalized now.    Hypoxic respiratory failure - supplemental oxygen 2L min started at admission, wean as able.   DNR - not present on admission. After discussions with palliative care, family elects DNR status. Golden rod and DNR order completed.   DVT prophylaxis: SQ  Heparin, SCD Code Status: Full  Family Communication: son, daughter at bedside  Disposition: awaiting SNF placement Status is: Inpatient  Remains inpatient appropriate because:IV treatments appropriate due to intensity of illness or inability to take PO and Inpatient level of care appropriate due to severity of illness  Dispo: The patient is from: Home              Anticipated d/c is to: SNF  (awaiting SNF bed)               Patient currently is medically stable to d/c.   Difficult to place patient No Consultants:   Cardiology  Orthopedics  palliative  Procedures:   n/a  Antimicrobials:  n/a   Subjective: Pt is eating and drinking a lot better today.  She says that her pain seems to be controlled right now.  Objective: Vitals:   08/30/20 0614 08/30/20 1005 08/30/20 1017 08/30/20 1244  BP:  (!) 92/49 132/78   Pulse: (!) 103 (!) 113 99 (!) 103  Resp: 18 16  18   Temp: 99 F (37.2 C) 99.1 F (37.3 C)  98.3 F (36.8 C)  TempSrc:  Oral  Oral  SpO2: 100% 97%  100%  Weight:      Height:        Intake/Output Summary (Last 24 hours) at 08/30/2020 1414 Last data filed at 08/30/2020 1300 Gross per 24 hour  Intake 715.65 ml  Output 600 ml  Net 115.65 ml   Filed Weights   08/27/20 1455 08/29/20 0407 08/29/20 2024  Weight: 86.1 kg 85 kg 89.5 kg   Examination:  General exam: Pt resting comfortably, no distress, Appears calm and  comfortable. She has dementia.  Respiratory system: Clear to auscultation. Respiratory effort normal. Cardiovascular system: normal S1 & S2 heard. No JVD, murmurs, rubs, gallops or clicks. No pedal edema. Gastrointestinal system: Abdomen is nondistended, soft and nontender. No organomegaly or masses felt. Normal bowel sounds heard. Central nervous system: Alert and oriented. No focal neurological deficits. Extremities: bandages clean dry and intact. Palpable pulses BLEs.  Skin: No rashes, lesions or ulcers Psychiatry: Judgement and insight appear poor. Mood & affect appropriate.   Data Reviewed: I have personally reviewed following labs and imaging studies  CBC: Recent Labs  Lab 08/25/20 1610 08/26/20 0459 08/27/20 0426 08/28/20 0424 08/29/20 0520  WBC 11.3* 12.4* 10.4 9.2 7.6  NEUTROABS 8.6*  --   --   --   --   HGB 13.1 12.3 11.7* 9.1* 9.6*  HCT 43.0 40.3 39.5 29.5* 32.3*  MCV 96.6 97.6 98.0 97.4 98.8  PLT 169 148* 137* 125* 139*    Basic Metabolic Panel: Recent Labs  Lab 08/25/20 1610 08/26/20 0459 08/27/20 0426 08/28/20 0424 08/29/20 0520  NA 134* 138 136 135 137  K 4.6 4.5 4.4 4.4 4.4  CL 98 99 98 99 101  CO2 28 28 30 29 30   GLUCOSE 141* 127* 101* 130* 95  BUN 23 27* 30* 24* 19  CREATININE 0.96 0.98 0.87 0.72 0.64  CALCIUM 8.8* 9.2 8.7* 8.1* 8.3*  MG  --  1.7 2.2 1.9 2.0  PHOS  --  3.2  --   --   --     GFR: Estimated Creatinine Clearance: 57.8 mL/min (by C-G formula based on SCr of 0.64 mg/dL).  Liver Function Tests: Recent Labs  Lab 08/25/20 1610 08/26/20 0459 08/28/20 0424 08/29/20 0520  AST 31 22 18 24   ALT 21 17 11 10   ALKPHOS 52 46 39 43  BILITOT 1.0 1.2 0.9 1.0  PROT 7.1 6.6 5.7* 6.1*  ALBUMIN 3.6 3.4* 2.8* 2.9*    CBG: Recent Labs  Lab 08/27/20 1318  GLUCAP 128*    Recent Results (from the past 240 hour(s))  SARS CORONAVIRUS 2 (TAT 6-24 HRS) Nasopharyngeal Nasopharyngeal Swab     Status: None   Collection Time: 08/25/20  5:52 PM    Specimen: Nasopharyngeal Swab  Result Value Ref Range Status   SARS Coronavirus 2 NEGATIVE NEGATIVE Final    Comment: (NOTE) SARS-CoV-2 target nucleic acids are NOT DETECTED.  The SARS-CoV-2 RNA is generally detectable in upper and lower respiratory specimens during the acute phase of infection. Negative results do not preclude SARS-CoV-2 infection, do not rule out co-infections with other pathogens, and should not be used as the sole basis for treatment or other patient  management decisions. Negative results must be combined with clinical observations, patient history, and epidemiological information. The expected result is Negative.  Fact Sheet for Patients: HairSlick.no  Fact Sheet for Healthcare Providers: quierodirigir.com  This test is not yet approved or cleared by the [place] FDA and  has been authorized for detection and/or diagnosis of SARS-CoV-2 by FDA under an Emergency Use Authorization (EUA). This EUA will remain  in effect (meaning this test can be used) for the duration of the COVID-19 declaration under Se ction 564(b)(1) of the Act, 21 U.S.C. section 360bbb-3(b)(1), unless the authorization is terminated or revoked sooner.  Performed at [facility], [address], [place], [place] [address]   Surgical pcr screen     Status: None   Collection Time: 08/27/20  1:51 AM   Specimen: Nasal Mucosa; Nasal Swab  Result Value Ref Range Status   MRSA, PCR NEGATIVE NEGATIVE Final   Staphylococcus aureus NEGATIVE NEGATIVE Final    Comment: (NOTE) The Xpert SA Assay (FDA approved for NASAL specimens in patients 60 years of age and older), is one component of a comprehensive surveillance program. It is not intended to diagnose infection nor to guide or monitor treatment. Performed at [facility], [address], [place], [place] [address]     Radiology Studies: No results found. Scheduled Meds: .  acetaminophen  650 mg Oral Q6H  . aspirin  81 mg Oral BID  . atenolol  50 mg Oral Daily  . calcium-vitamin D  1 tablet Oral Daily  . Chlorhexidine Gluconate Cloth  6 each Topical Daily  . diclofenac sodium  2 g Topical QID  . DULoxetine  30 mg Oral Daily  . DULoxetine  60 mg Oral Daily  . gabapentin  800 mg Oral TID  . pantoprazole  40 mg Oral Daily  . rosuvastatin  40 mg Oral Daily   Continuous Infusions:   LOS: 5 days   Time spent: 35 mins   Shamyah Stantz Laural Benes, MD How to contact the [facility] Attending or Consulting provider 7A - 7P or covering provider during after hours 7P -7A, for this patient?  1. Check the care team in [facility] and look for a) attending/consulting TRH provider listed and b) the [facility] team listed 2. Log into [email or web address] and use [place]'s universal password to access. If you do not have the password, please contact the hospital operator. 3. Locate the [facility] provider you are looking for under [facility] and page to a number that you can be directly reached. 4. If you still have difficulty reaching the provider, please page the [facility] (Director on Call) for the Hospitalists listed on amion for assistance.  08/30/2020, 2:14 PM

## 2020-08-31 DIAGNOSIS — Z515 Encounter for palliative care: Secondary | ICD-10-CM | POA: Diagnosis not present

## 2020-08-31 DIAGNOSIS — F0390 Unspecified dementia without behavioral disturbance: Secondary | ICD-10-CM | POA: Diagnosis not present

## 2020-08-31 DIAGNOSIS — S72002D Fracture of unspecified part of neck of left femur, subsequent encounter for closed fracture with routine healing: Secondary | ICD-10-CM | POA: Diagnosis not present

## 2020-08-31 DIAGNOSIS — Z7189 Other specified counseling: Secondary | ICD-10-CM | POA: Diagnosis not present

## 2020-08-31 NOTE — Progress Notes (Signed)
   ORTHOPAEDIC PROGRESS NOTE  Procedure(s): left hip hemiarthroplasty for a displaced femoral neck fracture  DOS: 08/27/20  SUBJECTIVE: Resting. Hip pain improving.  No acute events.  She has been transferred to the floor over the weekend.  Waiting for placement.  OBJECTIVE: PE:  Answers simple questions.  Sleeping, but easily wakes up to answer questions.    Toes warm and well perfused, 2+ DP pulse Active motion intact to EHL/TA/GS Decreased sensation to the left foot, globally No bruising over the lateral thigh Hip abduction pillow in place. Dressing over lateral hip is clean, dry and intact.   Vitals:   08/31/20 1412 08/31/20 2105  BP: 109/73   Pulse: 80 88  Resp: 19 19  Temp: 98.7 F (37.1 C) 98.1 F (36.7 C)  SpO2: 97% 100%     ASSESSMENT: [name] is a 87 y.o. female doing well postop.  Stable.  PT recommends SNF, waiting for placement   PLAN: Weightbearing: WBAT LLE; posterior hip precautions.  Insicional and dressing care: Reinforce dressings as needed Orthopedic device(s): Hip abduction pillow VTE prophylaxis: to be determined following surgery; plan for aspirin 81 mg BID due to fall risk  Pain control: PRN; patient has a history of narcotic use Follow - up plan: 2 weeks PT/OT consult  Contact information:     Durene Dodge A. Amedeo Kinsman, MD [facility] [address] [place],  [place]  [address] Phone: [phone] Fax: [phone]

## 2020-08-31 NOTE — Progress Notes (Signed)
Physical Therapy Treatment Patient Details Name: [name] MRN: [id number] DOB: 1933/08/31 Today's Date: 08/31/2020    History of Present Illness [name] is an 87 y.o. female with medical history significant for atrial fibrillation, hypertension, hyperlipidemia, GERD, gait instability (ambulates with a walker) who presents to the emergency department via EMS due to fall sustained around 2:30 AM today.  Patient states that she got up at night to use the restroom and she came back to bed, shortly after this, she got up again for an unknown reason and sustained a fall.  Son at bedside, helped her back to bed, this morning when the home health aide tried to assist her to go to the bathroom, she was unable to get up due to left hip pain.  EMS was activated and patient was taken to the ED for further evaluation and management.  Pt had a Lt hip fx which was operated on 08/27/2020. PT is to follow posterior precautions with WBAT, abduction pillow in when pt is in the bed.    PT Comments    Patient demonstrates slow labored movement for sitting up at bedside with difficulty moving LLE due to increased pain, fair return for completing BLE ROM/strengthening exercises while seated at bedside and required tactile assistance to complete LLE LAQ's due to pain.  Patient able to take a few side steps at bedside before having to sit due to fatigue, LLE pain and generalized weakness.  Patient tolerated sitting up in chair after therapy - RN notified.  Patient will benefit from continued physical therapy in hospital and recommended venue below to increase strength, balance, endurance for safe ADLs and gait.      Follow Up Recommendations  SNF     Equipment Recommendations  None recommended by PT    Recommendations for Other Services       Precautions / Restrictions Precautions Precautions: Posterior Hip Required Braces or Orthoses: Other Brace Other Brace: Abduction pillow while in  bed Restrictions Weight Bearing Restrictions: Yes LLE Weight Bearing: Weight bearing as tolerated    Mobility  Bed Mobility Overal bed mobility: Needs Assistance Bed Mobility: Supine to Sit     Supine to sit: Max assist     General bed mobility comments: slow  labored movement with fair carryover for propping up on elbows    Transfers Overall transfer level: Needs assistance Equipment used: Rolling walker (2 wheeled) Transfers: Sit to/from [facility] Sit to Stand: Mod assist Stand pivot transfers: Mod assist       General transfer comment: increased time, labored movement  Ambulation/Gait Ambulation/Gait assistance: Mod assist;Max assist Gait Distance (Feet): 4 Feet Assistive device: Rolling walker (2 wheeled) Gait Pattern/deviations: Decreased step length - right;Decreased step length - left;Decreased stride length Gait velocity: decreased   General Gait Details: limited to 4-5 slow labored side steps with difficulty advancing LLE due to weakness, increased pain   Stairs             Wheelchair Mobility    Modified Rankin (Stroke Patients Only)       Balance Overall balance assessment: Needs assistance Sitting-balance support: Feet supported;No upper extremity supported Sitting balance-Leahy Scale: Good Sitting balance - Comments: seated at EOB                                    Cognition Arousal/Alertness: Awake/alert Behavior During Therapy: WFL for tasks assessed/performed Overall Cognitive Status:  Within Functional Limits for tasks assessed                                        Exercises General Exercises - Lower Extremity Ankle Circles/Pumps: Seated;AROM;Strengthening;Both;10 reps Long Arc Quad: Seated;AROM;AAROM;Strengthening;Both;10 reps    General Comments        Pertinent Vitals/Pain Pain Assessment: Faces Faces Pain Scale: Hurts little more Pain Location: left hip with  movement Pain Descriptors / Indicators: Sore;Grimacing Pain Intervention(s): Limited activity within patient's tolerance;Monitored during session;Repositioned    Home Living                      Prior Function            PT Goals (current goals can now be found in the care plan section) Acute Rehab PT Goals Patient Stated Goal: to walk again PT Goal Formulation: With patient/family Time For Goal Achievement: 09/11/20 Potential to Achieve Goals: Good Progress towards PT goals: Progressing toward goals    Frequency    Min 3X/week      PT Plan Current plan remains appropriate    Co-evaluation              AM-PAC PT "6 Clicks" Mobility   Outcome Measure  Help needed turning from your back to your side while in a flat bed without using bedrails?: A Lot Help needed moving from lying on your back to sitting on the side of a flat bed without using bedrails?: A Lot Help needed moving to and from a bed to a chair (including a wheelchair)?: A Lot Help needed standing up from a chair using your arms (e.g., wheelchair or bedside chair)?: A Lot Help needed to walk in hospital room?: A Lot Help needed climbing 3-5 steps with a railing? : Total 6 Click Score: 11    End of Session   Activity Tolerance: Patient tolerated treatment well;Patient limited by fatigue;Patient limited by pain Patient left: in chair;with call bell/phone within reach Nurse Communication: Mobility status PT Visit Diagnosis: Unsteadiness on feet (R26.81);Other abnormalities of gait and mobility (R26.89);Repeated falls (R29.6);Muscle weakness (generalized) (M62.81);History of falling (Z91.81);Pain Pain - Right/Left: Left Pain - part of body: Hip     Time: 1340-1411 PT Time Calculation (min) (ACUTE ONLY): 31 min  Charges:  $Therapeutic Exercise: 8-22 mins $Therapeutic Activity: 8-22 mins                     2:27 PM, 08/31/20 Lonell Grandchild, MPT Physical Therapist with [facility] [phone] office [phone] mobile phone

## 2020-08-31 NOTE — TOC Progression Note (Signed)
Transition of Care [facility]) - Progression Note    Patient Details  Name: [name] MRN: [id number] Date of Birth: Apr 04, 1934  Transition of Care [facility]) CM/SW Contact  Ihor Gully, LCSW Phone Number: 08/31/2020, 2:02 PM  Clinical Narrative:    Patient has not been vaccinated for COVID. Patient's insurance switched from traditional Medicare to [employer] on yesterday.  Blaine Hamper started today. Patient will discharge to [facility].   Expected Discharge Plan: [place] Barriers to Discharge: Continued Medical Work up  Expected Discharge Plan and Services Expected Discharge Plan: [place] arrangements for the past 2 months: Single Family Home                                       Social Determinants of Health (SDOH) Interventions    Readmission Risk Interventions No flowsheet data found.

## 2020-08-31 NOTE — Progress Notes (Signed)
OT Cancellation Note  Patient Details Name: [name] MRN: [id number] DOB: [birth year]   Cancelled Treatment:    Reason Eval/Treat Not Completed: Patient declined, no reason specified. Pt was lethargic but able to report history despite eyes being closed during questioning. Pt then refused multiple attempts to sit up and EOB reporting, "I don't feel like it." Pt was informed of importance of getting out of bed, but pt continued to refuse. Will attempt evaluation later as time permits.   Anishka Bushard OT, MOT   Larey Seat 08/31/2020, 9:08 AM

## 2020-08-31 NOTE — Progress Notes (Signed)
Palliative: [name] is sitting up in the [facility] chair in her room.  Nursing staff is at bedside attending needs.  She is alert, but with known dementia.  I believe that she is able to make her basic needs known.  There is no family at bedside at this time.  [name] tells me that her preference is to return home.  Nursing staff and I reassure her that she should only be in short-term rehab for limited time.  I shared that her family is planning to see her daily.  Anticipate discharge to [facility] today.  Conference with attending, bedside nursing staff, transition of care team related to patient condition, needs, goals of care, disposition.  Plan: Continue to treat the treatable but no CPR or intubation.  Short-term rehab after hip fracture repair.  Return home with outpatient palliative services following.  15 minutes [name], NP Palliative medicine team Team phone [phone] Greater than 50% of this time was spent counseling and coordinating care related to the above assessment and plan.

## 2020-08-31 NOTE — Care Management Important Message (Signed)
Important Message  Patient Details  Name: [name] MRN: [id number] Date of Birth: December 07, 1933   Medicare Important Message Given:  Yes Gilmore Laroche, RN will deliver letter due to contact precautions)     Tommy Medal 08/31/2020, 1:00 PM

## 2020-08-31 NOTE — Progress Notes (Signed)
PROGRESS NOTE   [name]  ZOX:[id number] DOB: [birth year] DOA: 08/25/2020 PCP: Donita Brooks, MD   Chief Complaint  Patient presents with  . Fall   Level of care: Med-Surg  Brief Admission History:   87 y.o. female with medical history significant for atrial fibrillation, hypertension, hyperlipidemia, GERD, gait instability (ambulates with a walker) who presents to the emergency department via EMS due to fall sustained around 2:30 AM today.  Patient states that she got up at night to use the restroom and she came back to bed, shortly after this, she got up again for an unknown reason and sustained a fall.  Son at bedside, helped her back to bed, this morning when the home health aide tried to assist her to go to the bathroom, she was unable to get up due to left hip pain.  EMS was activated and patient was taken to the ED for further evaluation and management. Xrays confirmed left hip fracture.    Assessment & Plan:   Principal Problem:   Closed left hip fracture (HCC) Active Problems:   Hyperlipidemia   Essential hypertension   OSA (obstructive sleep apnea)   Pre-diabetes   Fall at home, initial encounter   Atrial fibrillation, chronic (HCC)   Gait abnormality   Leukocytosis   Hyperglycemia   Elevated brain natriuretic peptide (BNP) level   GERD (gastroesophageal reflux disease)  Acute left subcapital hip fracture due to fall at home - Pt is POD #4 s/p operative repair on 4/28.  Post op pain management and DVT prophylaxis per orthopedics.  Pt is awaiting SNF bed placement.     Chronic pain with acute exacerbation from recent hip fracture - postop pain mgmt per ortho orders. Pain seems to be better controlled today.    Atrial fibrillation - chronic - HR has been controlled with atenolol.  She is not fully anticoagulated due to frequent falling.  Has been on aspirin.  Cardiology consulted for preoperative evaluation.   Resumed home atenolol.    GERD - protonix ordered  for GI protection.   OSA - noncompliant with home CPAP use due to damaged equipment.  Outpatient follow up with pulmonary clinic for repeat sleep study, etc.   Hyperlipidemia - well controlled on crestor 20 mg daily.   Leukocytosis - reactive due to acute fracture.  CBC Normalized now.    Hypoxic respiratory failure - supplemental oxygen 2L min started at admission, wean as able.   DNR - not present on admission. After discussions with palliative care, family elects DNR status. Golden rod and DNR order completed.   DVT prophylaxis: SQ  Heparin, SCD Code Status: Full  Family Communication: son, daughter at bedside  Disposition: awaiting SNF placement Status is: Inpatient  Remains inpatient appropriate because:IV treatments appropriate due to intensity of illness or inability to take PO and Inpatient level of care appropriate due to severity of illness  Dispo: The patient is from: Home              Anticipated d/c is to: SNF  (awaiting SNF bed)               Patient currently is medically stable to d/c.   Difficult to place patient No Consultants:   Cardiology  Orthopedics  palliative  Procedures:   n/a  Antimicrobials:  n/a   Subjective: Pt awake and no specific complaint today. Awaiting to go to rehab placement.       Objective: Vitals:   08/30/20 2124  08/31/20 0427 08/31/20 0432 08/31/20 1412  BP: (!) 85/62 (!) 59/31 110/72 109/73  Pulse:  88  80  Resp:  19  19  Temp:  98.9 F (37.2 C)  98.7 F (37.1 C)  TempSrc:    Oral  SpO2:  100%  97%  Weight:      Height:        Intake/Output Summary (Last 24 hours) at 08/31/2020 1455 Last data filed at 08/31/2020 1300 Gross per 24 hour  Intake 720 ml  Output 950 ml  Net -230 ml   Filed Weights   08/27/20 1455 08/29/20 0407 08/29/20 2024  Weight: 86.1 kg 85 kg 89.5 kg   Examination:  General exam: no distress, Appears calm and comfortable. She has dementia.  Respiratory system: Clear to auscultation.  Respiratory effort normal. Cardiovascular system: normal S1 & S2 heard. No JVD, murmurs, rubs, gallops or clicks. No pedal edema. Gastrointestinal system: Abdomen is nondistended, soft and nontender. No organomegaly or masses felt. Normal bowel sounds heard. Central nervous system: Alert and oriented. No focal neurological deficits. Extremities: bandages clean dry and intact. Palpable pulses BLEs.  Skin: No rashes, lesions or ulcers Psychiatry: Judgement and insight appear poor. Mood & affect appropriate.   Data Reviewed: I have personally reviewed following labs and imaging studies  CBC: Recent Labs  Lab 08/25/20 1610 08/26/20 0459 08/27/20 0426 08/28/20 0424 08/29/20 0520  WBC 11.3* 12.4* 10.4 9.2 7.6  NEUTROABS 8.6*  --   --   --   --   HGB 13.1 12.3 11.7* 9.1* 9.6*  HCT 43.0 40.3 39.5 29.5* 32.3*  MCV 96.6 97.6 98.0 97.4 98.8  PLT 169 148* 137* 125* 139*    Basic Metabolic Panel: Recent Labs  Lab 08/25/20 1610 08/26/20 0459 08/27/20 0426 08/28/20 0424 08/29/20 0520  NA 134* 138 136 135 137  K 4.6 4.5 4.4 4.4 4.4  CL 98 99 98 99 101  CO2 28 28 30 29 30   GLUCOSE 141* 127* 101* 130* 95  BUN 23 27* 30* 24* 19  CREATININE 0.96 0.98 0.87 0.72 0.64  CALCIUM 8.8* 9.2 8.7* 8.1* 8.3*  MG  --  1.7 2.2 1.9 2.0  PHOS  --  3.2  --   --   --     GFR: Estimated Creatinine Clearance: 57.8 mL/min (by C-G formula based on SCr of 0.64 mg/dL).  Liver Function Tests: Recent Labs  Lab 08/25/20 1610 08/26/20 0459 08/28/20 0424 08/29/20 0520  AST 31 22 18 24   ALT 21 17 11 10   ALKPHOS 52 46 39 43  BILITOT 1.0 1.2 0.9 1.0  PROT 7.1 6.6 5.7* 6.1*  ALBUMIN 3.6 3.4* 2.8* 2.9*    CBG: Recent Labs  Lab 08/27/20 1318  GLUCAP 128*    Recent Results (from the past 240 hour(s))  SARS CORONAVIRUS 2 (TAT 6-24 HRS) Nasopharyngeal Nasopharyngeal Swab     Status: None   Collection Time: 08/25/20  5:52 PM   Specimen: Nasopharyngeal Swab  Result Value Ref Range Status   SARS  Coronavirus 2 NEGATIVE NEGATIVE Final    Comment: (NOTE) SARS-CoV-2 target nucleic acids are NOT DETECTED.  The SARS-CoV-2 RNA is generally detectable in upper and lower respiratory specimens during the acute phase of infection. Negative results do not preclude SARS-CoV-2 infection, do not rule out co-infections with other pathogens, and should not be used as the sole basis for treatment or other patient management decisions. Negative results must be combined with clinical observations, patient history, and epidemiological  information. The expected result is Negative.  Fact Sheet for Patients: HairSlick.no  Fact Sheet for Healthcare Providers: quierodirigir.com  This test is not yet approved or cleared by the [place] FDA and  has been authorized for detection and/or diagnosis of SARS-CoV-2 by FDA under an Emergency Use Authorization (EUA). This EUA will remain  in effect (meaning this test can be used) for the duration of the COVID-19 declaration under Se ction 564(b)(1) of the Act, 21 U.S.C. section 360bbb-3(b)(1), unless the authorization is terminated or revoked sooner.  Performed at [facility], [address], [place], [place] [address]   Surgical pcr screen     Status: None   Collection Time: 08/27/20  1:51 AM   Specimen: Nasal Mucosa; Nasal Swab  Result Value Ref Range Status   MRSA, PCR NEGATIVE NEGATIVE Final   Staphylococcus aureus NEGATIVE NEGATIVE Final    Comment: (NOTE) The Xpert SA Assay (FDA approved for NASAL specimens in patients 3 years of age and older), is one component of a comprehensive surveillance program. It is not intended to diagnose infection nor to guide or monitor treatment. Performed at [facility], [address], [place], [place] [address]    Radiology Studies: No results found. Scheduled Meds: . acetaminophen  650 mg Oral Q6H  . aspirin  81 mg Oral BID  . atenolol  50  mg Oral Daily  . calcium-vitamin D  1 tablet Oral Daily  . Chlorhexidine Gluconate Cloth  6 each Topical Daily  . diclofenac sodium  2 g Topical QID  . DULoxetine  30 mg Oral Daily  . DULoxetine  60 mg Oral Daily  . gabapentin  800 mg Oral TID  . pantoprazole  40 mg Oral Daily  . rosuvastatin  40 mg Oral Daily   Continuous Infusions:   LOS: 6 days   Time spent: 35 mins   Antoninette Lerner Laural Benes, MD How to contact the [facility] Attending or Consulting provider 7A - 7P or covering provider during after hours 7P -7A, for this patient?  1. Check the care team in [facility] and look for a) attending/consulting TRH provider listed and b) the [facility] team listed 2. Log into [email or web address] and use [place]'s universal password to access. If you do not have the password, please contact the hospital operator. 3. Locate the [facility] provider you are looking for under [facility] and page to a number that you can be directly reached. 4. If you still have difficulty reaching the provider, please page the [facility] (Director on Call) for the Hospitalists listed on amion for assistance.  08/31/2020, 2:55 PM

## 2020-09-01 LAB — SARS CORONAVIRUS 2 (TAT 6-24 HRS): SARS Coronavirus 2: NEGATIVE

## 2020-09-01 MED ORDER — ASPIRIN EC 81 MG PO TBEC: DELAYED_RELEASE_TABLET | ORAL | 11 refills | Status: DC

## 2020-09-01 MED ORDER — ACETAMINOPHEN 325 MG PO TABS: 650.0000 mg | ORAL_TABLET | Freq: Four times a day (QID) | ORAL | Status: DC

## 2020-09-01 MED ORDER — SENNOSIDES-DOCUSATE SODIUM 8.6-50 MG PO TABS
1.0000 | ORAL_TABLET | Freq: Two times a day (BID) | ORAL | Status: DC
  Administered 2020-09-01: 1 via ORAL
  Filled 2020-09-01: qty 1

## 2020-09-01 MED ORDER — DULOXETINE HCL 30 MG PO CPEP: 90.0000 mg | ORAL_CAPSULE | Freq: Every day | ORAL | 2 refills | Status: DC

## 2020-09-01 MED ORDER — OXYCODONE HCL 5 MG PO TABS: 5.0000 mg | ORAL_TABLET | Freq: Four times a day (QID) | ORAL | 0 refills | Status: AC | PRN

## 2020-09-01 MED ORDER — ALPRAZOLAM 0.25 MG PO TABS: 0.2500 mg | ORAL_TABLET | Freq: Two times a day (BID) | ORAL | 0 refills | Status: DC | PRN

## 2020-09-01 MED ORDER — SENNOSIDES-DOCUSATE SODIUM 8.6-50 MG PO TABS: 1.0000 | ORAL_TABLET | Freq: Two times a day (BID) | ORAL | Status: DC

## 2020-09-01 MED ORDER — SODIUM CHLORIDE 0.9 % IV BOLUS
250.0000 mL | Freq: Once | INTRAVENOUS | Status: AC
  Administered 2020-09-01: 250 mL via INTRAVENOUS

## 2020-09-01 MED ORDER — BISACODYL 10 MG RE SUPP: 10.0000 mg | Freq: Every day | RECTAL | 0 refills | Status: DC | PRN

## 2020-09-01 NOTE — Discharge Instructions (Signed)
Post-operative plan:  The patient will be WBAT on the operative extremity. Hip abduction pillow in place at all times when in bed Posterior hip precautions PT/OT    DVT prophylaxis Aspirin 81 mg twice daily for 6 weeks.    Pain control with PRN pain medication preferring oral medicines.   Follow up plan will be scheduled in approximately 14 days for incision check and XR.  If she is DC to a facility and they are comfortable removing the staples around 2 weeks postop, I can see her in clinic at 6 weeks postop.  If there are any concerns, at any time, I can see her in follow up in the clinic    IMPORTANT INFORMATION: [place]  Follow with Primary care provider  [name], MD  and other consultants as instructed by your Hospitalist Physician  [place], WORSEN OR NEW PROBLEM DEVELOPS   Please note: You were cared for by a hospitalist during your hospital stay. Every effort will be made to forward records to your primary care provider.  You can request that your primary care provider send for your hospital records if they have not received them.  Once you are discharged, your primary care physician will handle any further medical issues. Please note that NO REFILLS for any discharge medications will be authorized once you are discharged, as it is imperative that you return to your primary care physician (or establish a relationship with a primary care physician if you do not have one) for your post hospital discharge needs so that they can reassess your need for medications and monitor your lab values.  Please get a complete blood count and chemistry panel checked by your Primary MD at your next visit, and again as instructed by your Primary MD.  Get Medicines reviewed and adjusted: Please take all your medications with you for your next visit with your Primary  MD  Laboratory/radiological data: Please request your Primary MD to go over all hospital tests and procedure/radiological results at the follow up, please ask your primary care provider to get all Hospital records sent to his/her office.  In some cases, they will be blood work, cultures and biopsy results pending at the time of your discharge. Please request that your primary care provider follow up on these results.  If you are diabetic, please bring your blood sugar readings with you to your follow up appointment with primary care.    Please call and make your follow up appointments as soon as possible.    Also Note the following: If you experience worsening of your admission symptoms, develop shortness of breath, life threatening emergency, suicidal or homicidal thoughts you must seek medical attention immediately by calling 911 or calling your MD immediately  if symptoms less severe.  You must read complete instructions/literature along with all the possible adverse reactions/side effects for all the Medicines you take and that have been prescribed to you. Take any new Medicines after you have completely understood and accpet all the possible adverse reactions/side effects.   Do not drive when taking Pain medications or sleeping medications (Benzodiazepines)  Do not take more than prescribed Pain, Sleep and Anxiety Medications. It is not advisable to combine anxiety,sleep and pain medications without talking with your primary care practitioner  Special Instructions: If you have smoked or chewed Tobacco  in the last 2 yrs please stop smoking, stop any  regular Alcohol  and or any Recreational drug use.  Wear Seat belts while driving.  Do not drive if taking any narcotic, mind altering or controlled substances or recreational drugs or alcohol.

## 2020-09-01 NOTE — Evaluation (Signed)
Occupational Therapy Evaluation Patient Details Name: [name] MRN: [id number] DOB: June 17, 1933 Today's Date: 09/01/2020    History of Present Illness [name] is an 87 y.o. female with medical history significant for atrial fibrillation, hypertension, hyperlipidemia, GERD, gait instability (ambulates with a walker) who presents to the emergency department via EMS due to fall sustained around 2:30 AM today.  Patient states that she got up at night to use the restroom and she came back to bed, shortly after this, she got up again for an unknown reason and sustained a fall.  Son at bedside, helped her back to bed, this morning when the home health aide tried to assist her to go to the bathroom, she was unable to get up due to left hip pain.  EMS was activated and patient was taken to the ED for further evaluation and management.  Pt had a Lt hip fx which was operated on 08/27/2020. PT is to follow posterior precautions with WBAT, abduction pillow in when pt is in the bed.   Clinical Impression   Pt resistant initially to OT evaluation due to reports of pain. Pt more receptive after nursing was notified of pt's request for pain medication. Pt requires max A for stand pivot transfers using RW. Extended time needed for weight shift to L LE to allow movement of R LE during the stand pivot transfer. Max A required for supine to sit bed mobility. Pt demonstrates general B UE weakness with ~145* A/ROM in L UE. Pt will benefit from continued OT in the hospital and recommended venue below to increase strength, balance, ROM, and endurance for safe ADL's.     Follow Up Recommendations  SNF    Equipment Recommendations  None recommended by OT           Precautions / Restrictions Precautions Precautions: Posterior Hip Required Braces or Orthoses: Other Brace Other Brace: Abduction pillow while in bed Restrictions Weight Bearing Restrictions: Yes LLE Weight Bearing: Weight bearing as tolerated       Mobility Bed Mobility Overal bed mobility: Needs Assistance Bed Mobility: Supine to Sit       Sit to supine: Max assist   General bed mobility comments: slow labored movement    Transfers Overall transfer level: Needs assistance Equipment used: Rolling walker (2 wheeled) Transfers: Sit to/from [facility] Sit to Stand: Mod assist;Max assist Stand pivot transfers: Max assist       General transfer comment: increased time; labored movement; difficulty shifting weight to L LE and moving R LE to assist with Stand Pivot Transfer.    Balance Overall balance assessment: Needs assistance Sitting-balance support: Feet supported;No upper extremity supported Sitting balance-Leahy Scale: Fair Sitting balance - Comments: seated at EOB   Standing balance support: Bilateral upper extremity supported;During functional activity Standing balance-Leahy Scale: Poor Standing balance comment: using RW during stand pivot transfer                           ADL either performed or assessed with clinical judgement   ADL Overall ADL's : Needs assistance/impaired                 Upper Body Dressing : Total assistance;Bed level Upper Body Dressing Details (indicate cue type and reason): total assist to don socks at bed level.     Toilet Transfer: Maximal assistance;RW;Stand-pivot Armed forces technical officer Details (indicate cue type and reason): simulated via EOB to chair with RW  Vision Baseline Vision/History: No visual deficits (per pet report) Patient Visual Report: No change from baseline                  Pertinent Vitals/Pain Pain Assessment: Faces Faces Pain Scale: Hurts little more Pain Location: left hip with movement; B shoulders with AA/ROM Pain Descriptors / Indicators: Sore;Grimacing Pain Intervention(s): Limited activity within patient's tolerance;Monitored during session     Hand Dominance Right   Extremity/Trunk  Assessment Upper Extremity Assessment Upper Extremity Assessment: Generalized weakness (Mild L UE decreased A/ROM to ~145*. [facility] P/ROM.)   Lower Extremity Assessment Lower Extremity Assessment: Defer to PT evaluation   Cervical / Trunk Assessment Cervical / Trunk Assessment: Normal   Communication Communication Communication: No difficulties   Cognition Arousal/Alertness: Awake/alert Behavior During Therapy: WFL for tasks assessed/performed Overall Cognitive Status: Within Functional Limits for tasks assessed                                                      Home Living Family/patient expects to be discharged to:: Skilled nursing facility Living Arrangements: Other relatives Available Help at Discharge: Family;Personal care attendant (per document review. Pt reports she lives with her sons.) Type of Home: Mobile home Home Access: Ramped entrance     Home Layout: One level     Bathroom Shower/Tub: Teacher, early years/pre: Standard     Home Equipment: Environmental consultant - 4 wheels;Bedside commode;Hospital bed;Wheelchair - manual (taken per doc review; pt possibly poor historian.)          Prior Functioning/Environment Level of Independence: Needs assistance  Gait / Transfers Assistance Needed: ambulates to the bathroom and back on her own, if she needs to go further she needs assistance for safety (taken per doc review due to Pt reports use Independent ambulation with RW in house.) ADL's / Homemaking Assistance Needed: aide assists (Pt reported independence with ADL's ; sons assist with IADL's according to pt.)            OT Problem List: Decreased strength;Decreased range of motion;Decreased activity tolerance;Impaired balance (sitting and/or standing);Obesity      OT Treatment/Interventions: Self-care/ADL training;Therapeutic exercise;Therapeutic activities;Balance training;Patient/family education;Energy conservation;DME and/or AE instruction     OT Goals(Current goals can be found in the care plan section) Acute Rehab OT Goals Patient Stated Goal: get pain meds OT Goal Formulation: With patient Time For Goal Achievement: 09/15/20 Potential to Achieve Goals: Good  OT Frequency: Min 2X/week       End of Session Equipment Utilized During Treatment: Gait belt;Rolling walker;Oxygen (2L O2) Nurse Communication: Patient requests pain meds  Activity Tolerance: Patient tolerated treatment well Patient left: in chair;with call bell/phone within reach;with chair alarm set  OT Visit Diagnosis: Unsteadiness on feet (R26.81);Repeated falls (R29.6);History of falling (Z91.81);Muscle weakness (generalized) (M62.81)                Time: 9937-1696 OT Time Calculation (min): 23 min Charges:  OT General Charges $OT Visit: 1 Visit OT Evaluation $OT Eval Low Complexity: [place] OT, MOT   Larey Seat 09/01/2020, 9:09 AM

## 2020-09-01 NOTE — TOC Transition Note (Signed)
Transition of Care [facility]) - CM/SW Discharge Note   Patient Details  Name: [name] MRN: [id number] Date of Birth: February 10, 1934  [facility] CM/SW Contact:  Ihor Gully, LCSW Phone Number: 09/01/2020, 1:55 PM   Clinical Narrative:    Discharge clinicals sent to facility. TOC signing off.    Final next level of care: Skilled Nursing Facility Barriers to Discharge: No Barriers Identified   Patient Goals and CMS Choice Patient states their goals for this hospitalization and ongoing recovery are:: rehab      Discharge Placement              Patient chooses bed at: [facility] Patient to be transferred to facility by: [facility] Name of family member notified: Audelia Acton, son Patient and family notified of of transfer: 09/01/20  Discharge Plan and Services                                     Social Determinants of Health (SDOH) Interventions     Readmission Risk Interventions No flowsheet data found.

## 2020-09-01 NOTE — Plan of Care (Signed)
  Problem: Acute Rehab OT Goals (only OT should resolve) Goal: Pt. Will Perform Grooming Flowsheets (Taken 09/01/2020 1009) Pt Will Perform Grooming:  sitting  with modified independence  bed level Goal: Pt. Will Perform Upper Body Dressing Flowsheets (Taken 09/01/2020 1009) Pt Will Perform Upper Body Dressing:  with modified independence  sitting Goal: Pt. Will Perform Lower Body Dressing Flowsheets (Taken 09/01/2020 1009) Pt Will Perform Lower Body Dressing:  with mod assist  with adaptive equipment  sitting/lateral leans  bed level Goal: Pt. Will Transfer To Toilet Flowsheets (Taken 09/01/2020 1009) Pt Will Transfer to Toilet:  with mod assist  stand pivot transfer  grab bars Goal: Pt/Caregiver Will Perform Home Exercise Program Flowsheets (Taken 09/01/2020 1009) Pt/caregiver will Perform Home Exercise Program:  Increased strength  Both right and left upper extremity  Increased ROM  Left upper extremity  Otis Burress OT, MOT

## 2020-09-01 NOTE — Discharge Summary (Signed)
Physician Discharge Summary  [name] [id number] DOB: 05-Jun-1933 DOA: 08/25/2020  PCP: [name], MD Orthopedics: Dr. Amedeo Kinsman  Admit date: 08/25/2020 Discharge date: 09/01/2020  Admitted From:  Home  Disposition: SNF   Recommendations for Outpatient Follow-up:  1. Follow up with Dr. Amedeo Kinsman (orthopedics) in 2 weeks for staple removal.  2. Assist with feeding.  3. Monitor for bowel movement and provide suppository if no BM in 24 hours 4. Ambulatory referral to palliative care.     Post-operative plan:  The patient will be WBAT on the operative extremity. Hip abduction pillow in place at all times when in bed Posterior hip precautions PT/OT   - SNF DVT prophylaxis Aspirin 81 mg twice daily for 6 weeks.    Pain control with PRN pain medication preferring oral medicines.   Follow up plan will be scheduled in approximately 14 days for incision check and XR.  If she is DC to a facility and they are comfortable removing the staples around 2 weeks postop, I can see her in clinic at 6 weeks postop.  If there are any concerns, at any time, Dr. Amedeo Kinsman can see her in follow up in the clinic  Discharge Condition: STABLE   CODE STATUS: DNR  DIET:  Dys 3, thin, please assist with feeding and encourage drinking, elevated head of bed at least 35 degree when eating  Brief Hospitalization Summary: Please see all hospital notes, images, labs for full details of the hospitalization. ADMISSION HPI: [name] is an 87 y.o. female with medical history significant for atrial fibrillation, hypertension, hyperlipidemia, GERD, gait instability (ambulates with a walker) who presents to the emergency department via EMS due to fall sustained around 2:30 AM today.  Patient states that she got up at night to use the restroom and she came back to bed, shortly after this, she got up again for an unknown reason and sustained a fall.  Son at bedside, helped her back to bed, this morning when the home  health aide tried to assist her to go to the bathroom, she was unable to get up due to left hip pain.  EMS was activated and patient was taken to the ED for further evaluation and management.  ED Course:  In the emergency department, she was intermittently tachycardic.  She was noted to have an O2 sat of 88.  On room air, so she was placed on supplemental oxygen at 2 LPM with improvement in O2 sats to 92-98%.  Work-up in the ED showed leukocytosis, hyperglycemia, BNP 133. Left hip x-ray showed mildly displaced and foreshortened subcapital left hip fracture. CT of head and CT cervical spine without contrast showed no evidence of acute intracranial pathology and no evidence of acute displaced fracture or subluxation of the cervical spine.  Orthopedic surgeon was consulted and will see patient in the morning with plan to operate on patient on Thursday (4/28) per ED physician.  Hospitalist was asked to admit patient for further evaluation and management.    Hospital Course  Acute left subcapital hip fracture due to fall at home - Pt is POD #5 s/p operative repair on 4/28.  Post op pain management and DVT prophylaxis per orthopedics.  Pt discharging to SNF.  Per ortho, DVT prophylaxis is aspirin 81 mg BID x 6 weeks, then she can resume regular home daily 81 mg.      Chronic pain with acute exacerbation from recent hip fracture - postop pain mgmt per ortho orders. Pain seems  to be better controlled.    Atrial fibrillation - chronic - HR has been controlled with atenolol.  She is not fully anticoagulated due to frequent falling.  Has been on aspirin.  Cardiology consulted for preoperative evaluation.   Resumed home atenolol.    GERD - protonix ordered for GI protection.   OSA - noncompliant with home CPAP use due to damaged equipment.  Outpatient follow up with pulmonary clinic for repeat sleep study, etc.   Hyperlipidemia - well controlled on crestor 20 mg daily.   Leukocytosis - reactive due to  acute fracture.  CBC Normalized now.    Hypoxic respiratory failure - supplemental oxygen 2L min.   DNR - not present on admission. After discussions with palliative care, family elects DNR status. [place] and DNR order completed.   DVT prophylaxis: SQ  Heparin, SCD Code Status: Full  Family Communication: son, daughter at bedside  Disposition: awaiting SNF placement Status is: Inpatient  Discharge Diagnoses:  Principal Problem:   Closed left hip fracture ([place]) Active Problems:   Hyperlipidemia   Essential hypertension   OSA (obstructive sleep apnea)   Pre-diabetes   Fall at home, initial encounter   Atrial fibrillation, chronic (HCC)   Gait abnormality   Leukocytosis   Hyperglycemia   Elevated brain natriuretic peptide (BNP) level   GERD (gastroesophageal reflux disease)  Discharge Instructions: Discharge Instructions    Amb Referral to Palliative Care   Complete by: As directed      Allergies as of 09/01/2020      Reactions   Sulfonamide Derivatives Hives, Rash      Medication List    STOP taking these medications   mupirocin ointment 2 % Commonly known as: BACTROBAN   nystatin cream Commonly known as: MYCOSTATIN   oxyCODONE-acetaminophen 7.5-325 MG tablet Commonly known as: Percocet     TAKE these medications   acetaminophen 325 MG tablet Commonly known as: TYLENOL Take 2 tablets (650 mg total) by mouth every 6 (six) hours. What changed:   medication strength  how much to take  when to take this  reasons to take this   ALPRAZolam 0.25 MG tablet Commonly known as: XANAX Take 1 tablet (0.25 mg total) by mouth 2 (two) times daily as needed for anxiety.   ammonium lactate 12 % cream Commonly known as: AMLACTIN Apply topically as needed for dry skin.   aspirin EC 81 MG tablet Take 1 po BID x 6 weeks then resume 1 po daily What changed:   how much to take  how to take this  when to take this  additional instructions   atenolol 50  MG tablet Commonly known as: TENORMIN Take 1 tablet (50 mg total) by mouth daily.   bisacodyl 10 MG suppository Commonly known as: DULCOLAX Place 1 suppository (10 mg total) rectally daily as needed for moderate constipation.   Calcium Carbonate-Vitamin D3 600-400 MG-UNIT Tabs Take 1 tablet by mouth daily.   diclofenac sodium 1 % Gel Commonly known as: VOLTAREN Apply 2 g topically 4 (four) times daily. Rub into affected area of foot 2 to 4 times daily   DULoxetine 30 MG capsule Commonly known as: CYMBALTA Take 3 capsules (90 mg total) by mouth daily. Takes along with 60mg  for total daily dose of 90mg . What changed:   how much to take  Another medication with the same name was removed. Continue taking this medication, and follow the directions you see here.   gabapentin 800 MG tablet Commonly known  as: NEURONTIN Take 1 tablet (800 mg total) by mouth 3 (three) times daily.   oxyCODONE 5 MG immediate release tablet Commonly known as: Oxy IR/ROXICODONE Take 1 tablet (5 mg total) by mouth every 6 (six) hours as needed for up to 5 days for severe pain.   pantoprazole 40 MG tablet Commonly known as: PROTONIX TAKE 1 TABLET BY MOUTH ONCE DAILY   rosuvastatin 40 MG tablet Commonly known as: CRESTOR TAKE 1 TABLET BY MOUTH EACH EVENING   senna-docusate 8.6-50 MG tablet Commonly known as: Senokot-S Take 1 tablet by mouth 2 (two) times daily.       Contact information for follow-up providers    Mordecai Rasmussen, MD. Schedule an appointment as soon as possible for a visit in 2 week(s).   Specialties: Orthopedic Surgery, Sports Medicine Why: Staple removal Contact information: [address] [place] [address] [phone]            Contact information for after-discharge care    Destination    [facility] .   Service: Skilled Nursing Contact information: [address] [place] [phone]                 Allergies  Allergen Reactions  . Sulfonamide Derivatives Hives and Rash   Allergies as of 09/01/2020      Reactions   Sulfonamide Derivatives Hives, Rash      Medication List    STOP taking these medications   mupirocin ointment 2 % Commonly known as: BACTROBAN   nystatin cream Commonly known as: MYCOSTATIN   oxyCODONE-acetaminophen 7.5-325 MG tablet Commonly known as: Percocet     TAKE these medications   acetaminophen 325 MG tablet Commonly known as: TYLENOL Take 2 tablets (650 mg total) by mouth every 6 (six) hours. What changed:   medication strength  how much to take  when to take this  reasons to take this   ALPRAZolam 0.25 MG tablet Commonly known as: XANAX Take 1 tablet (0.25 mg total) by mouth 2 (two) times daily as needed for anxiety.   ammonium lactate 12 % cream Commonly known as: AMLACTIN Apply topically as needed for dry skin.   aspirin EC 81 MG tablet Take 1 po BID x 6 weeks then resume 1 po daily What changed:   how much to take  how to take this  when to take this  additional instructions   atenolol 50 MG tablet Commonly known as: TENORMIN Take 1 tablet (50 mg total) by mouth daily.   bisacodyl 10 MG suppository Commonly known as: DULCOLAX Place 1 suppository (10 mg total) rectally daily as needed for moderate constipation.   Calcium Carbonate-Vitamin D3 600-400 MG-UNIT Tabs Take 1 tablet by mouth daily.   diclofenac sodium 1 % Gel Commonly known as: VOLTAREN Apply 2 g topically 4 (four) times daily. Rub into affected area of foot 2 to 4 times daily   DULoxetine 30 MG capsule Commonly known as: CYMBALTA Take 3 capsules (90 mg total) by mouth daily. Takes along with 60mg  for total daily dose of 90mg . What changed:   how much to take  Another medication with the same name was removed. Continue taking this medication, and follow the directions you see here.   gabapentin 800 MG  tablet Commonly known as: NEURONTIN Take 1 tablet (800 mg total) by mouth 3 (three) times daily.   oxyCODONE 5 MG immediate release tablet Commonly known as:  Oxy IR/ROXICODONE Take 1 tablet (5 mg total) by mouth every 6 (six) hours as needed for up to 5 days for severe pain.   pantoprazole 40 MG tablet Commonly known as: PROTONIX TAKE 1 TABLET BY MOUTH ONCE DAILY   rosuvastatin 40 MG tablet Commonly known as: CRESTOR TAKE 1 TABLET BY MOUTH EACH EVENING   senna-docusate 8.6-50 MG tablet Commonly known as: Senokot-S Take 1 tablet by mouth 2 (two) times daily.       Procedures/Studies: CT Head Wo Contrast  Result Date: 08/25/2020 CLINICAL DATA:  Un witnessed fall last night unsure of head injury EXAM: CT HEAD WITHOUT CONTRAST CT CERVICAL SPINE WITHOUT CONTRAST TECHNIQUE: Multidetector CT imaging of the head and cervical spine was performed following the standard protocol without intravenous contrast. Multiplanar CT image reconstructions of the cervical spine were also generated. COMPARISON:  March 27, 2018 and February 23, 2018. FINDINGS: CT HEAD FINDINGS Brain: No evidence of acute large vascular territory infarction, hemorrhage, extra-axial collection or mass lesion/mass effect. Stable global parenchymal volume loss with ex vacuo dilatation of ventricular system. Similar severe burden chronic microvascular ischemic white matter disease. Chronic low type infarcts in the right pons and bilateral basal ganglia. Vascular: No hyperdense vessel. Dense atherosclerotic calcifications of the skull base vessels with involvement of the bilateral MCAs. Skull: Normal. Negative for fracture or focal lesion. Sinuses/Orbits: The paranasal sinuses and mastoid air cells are predominantly clear. Orbits are grossly unremarkable. Other: None CT CERVICAL SPINE FINDINGS Alignment: Normal with preservation of the cervical lordosis. Degenerative anterolisthesis of C5 on C6. Skull base and vertebrae: No acute  fracture. No primary bone lesion or focal pathologic process. Soft tissues and spinal canal: No prevertebral fluid or swelling. No visible canal hematoma. Mineralized pannus posterior to the dens. Disc levels: Multilevel degenerative changes cervical spine including disc space narrowing, disc osteophyte complex and uncovertebral/facet hypertrophy most notable at C6-C7 Upper chest: None Other: Dense carotid artery calcifications. IMPRESSION: 1. No evidence of acute intracranial pathology. 2. Similar global parenchymal volume loss and sequela of microvascular ischemic disease. 3. No evidence of acute displaced fracture or subluxation of the cervical spine. 4. Multilevel degenerative changes of the cervical spine. 5. Dense carotid artery calcifications. Electronically Signed   By: Maudry Mayhew MD   On: 08/25/2020 17:36   CT Cervical Spine Wo Contrast  Result Date: 08/25/2020 CLINICAL DATA:  Un witnessed fall last night unsure of head injury EXAM: CT HEAD WITHOUT CONTRAST CT CERVICAL SPINE WITHOUT CONTRAST TECHNIQUE: Multidetector CT imaging of the head and cervical spine was performed following the standard protocol without intravenous contrast. Multiplanar CT image reconstructions of the cervical spine were also generated. COMPARISON:  March 27, 2018 and February 23, 2018. FINDINGS: CT HEAD FINDINGS Brain: No evidence of acute large vascular territory infarction, hemorrhage, extra-axial collection or mass lesion/mass effect. Stable global parenchymal volume loss with ex vacuo dilatation of ventricular system. Similar severe burden chronic microvascular ischemic white matter disease. Chronic low type infarcts in the right pons and bilateral basal ganglia. Vascular: No hyperdense vessel. Dense atherosclerotic calcifications of the skull base vessels with involvement of the bilateral MCAs. Skull: Normal. Negative for fracture or focal lesion. Sinuses/Orbits: The paranasal sinuses and mastoid air cells are  predominantly clear. Orbits are grossly unremarkable. Other: None CT CERVICAL SPINE FINDINGS Alignment: Normal with preservation of the cervical lordosis. Degenerative anterolisthesis of C5 on C6. Skull base and vertebrae: No acute fracture. No primary bone lesion or focal pathologic process. Soft tissues and spinal canal: No  prevertebral fluid or swelling. No visible canal hematoma. Mineralized pannus posterior to the dens. Disc levels: Multilevel degenerative changes cervical spine including disc space narrowing, disc osteophyte complex and uncovertebral/facet hypertrophy most notable at C6-C7 Upper chest: None Other: Dense carotid artery calcifications. IMPRESSION: 1. No evidence of acute intracranial pathology. 2. Similar global parenchymal volume loss and sequela of microvascular ischemic disease. 3. No evidence of acute displaced fracture or subluxation of the cervical spine. 4. Multilevel degenerative changes of the cervical spine. 5. Dense carotid artery calcifications. Electronically Signed   By: Dahlia Bailiff MD   On: 08/25/2020 17:36   DG CHEST PORT 1 VIEW  Result Date: 08/27/2020 CLINICAL DATA:  Dyspnea EXAM: PORTABLE CHEST 1 VIEW COMPARISON:  08/26/2020 FINDINGS: Multiple nodular pulmonary infiltrates are developed within the right lung, new since prior examination, possibly infectious or inflammatory in the acute setting. Mild prominence of the pulmonary interstitium is again noted in keeping with chronic interstitial change. No pneumothorax or pleural effusion. The thoracic aorta is tortuous and ectatic, similar to that noted on prior examination. Cardiac size is stably, mildly enlarged. Pulmonary vascularity is normal. IMPRESSION: Development of multifocal nodular infiltrate within the right lung, possibly infectious or inflammatory in the acute setting. Follow-up chest radiograph in 3-4 weeks is recommended to document resolution. Stable ectasias of the thoracic aorta. Stable cardiomegaly.  Electronically Signed   By: Fidela Salisbury MD   On: 08/27/2020 04:37   DG CHEST PORT 1 VIEW  Result Date: 08/26/2020 CLINICAL DATA:  Shortness of breath. EXAM: PORTABLE CHEST 1 VIEW COMPARISON:  08/25/2020 FINDINGS: The heart is within normal limits in size. There is stable tortuosity, ectasia and calcification of the thoracic aorta. Mild chronic bronchitic type interstitial lung changes but no infiltrates, edema or effusions. No pulmonary lesions. No pneumothorax. The bony thorax is intact.  Remote healed rib fractures are noted. IMPRESSION: No acute cardiopulmonary findings. Electronically Signed   By: Marijo Sanes M.D.   On: 08/26/2020 17:39   DG Chest Port 1 View  Result Date: 08/25/2020 CLINICAL DATA:  Fall EXAM: PORTABLE CHEST 1 VIEW COMPARISON:  May 21, 2020. FINDINGS: Unchanged cardiac enlargement. Tortuous calcified thoracic aorta. No focal consolidation. No significant pleural effusion or visible pneumothorax. Chronic thoracolumbar junction compression deformity. IMPRESSION: Stable exam, no acute pulmonary findings. Electronically Signed   By: Dahlia Bailiff MD   On: 08/25/2020 17:38   DG HIP UNILAT WITH PELVIS 2-3 VIEWS LEFT  Result Date: 08/27/2020 CLINICAL DATA:  Left hip replacement. EXAM: DG HIP (WITH OR WITHOUT PELVIS) 2-3V LEFT COMPARISON:  Left hip x-rays dated August 25, 2020. FINDINGS: The left hip demonstrates a hemiarthroplasty without evidence of hardware failure or complication. There is no fracture or dislocation. The alignment is anatomic. Post-surgical changes noted in the surrounding soft tissues. IMPRESSION: 1. Left hip hemiarthroplasty without acute postoperative complication. Electronically Signed   By: Titus Dubin M.D.   On: 08/27/2020 13:59   DG Hip Unilat W or Wo Pelvis 2-3 Views Left  Result Date: 08/25/2020 CLINICAL DATA:  Left hip pain after fall. EXAM: DG HIP (WITH OR WITHOUT PELVIS) 2-3V LEFT COMPARISON:  July 03, 2017. FINDINGS: Mildly displaced and  foreshortened left subcapital hip fracture. The femoral head appears well-seated within the acetabulum. Pelvic phleboliths. Vascular calcifications. IMPRESSION: Mildly displaced and foreshortened subcapital left hip fracture. Electronically Signed   By: Dahlia Bailiff MD   On: 08/25/2020 17:24     Subjective: Pt reports wanting something to drink.  Pain seems controlled.  Discharge Exam: Vitals:   09/01/20 0504 09/01/20 0954  BP: 112/77 98/66  Pulse: 90 97  Resp: 19 18  Temp: 98.2 F (36.8 C)   SpO2: 100% 100%   Vitals:   08/31/20 1412 08/31/20 2105 09/01/20 0504 09/01/20 0954  BP: 109/73  112/77 98/66  Pulse: 80 88 90 97  Resp: 19 19 19 18   Temp: 98.7 F (37.1 C) 98.1 F (36.7 C) 98.2 F (36.8 C)   TempSrc: Oral     SpO2: 97% 100% 100% 100%  Weight:      Height:       General: Pt is alert, awake, not in acute distress Cardiovascular: normal S1/S2 +, no rubs, no gallops Respiratory: CTA bilaterally, no wheezing, no rhonchi Abdominal: Soft, NT, ND, bowel sounds + Extremities: no edema, no cyanosis   The results of significant diagnostics from this hospitalization (including imaging, microbiology, ancillary and laboratory) are listed below for reference.     Microbiology: Recent Results (from the past 240 hour(s))  SARS CORONAVIRUS 2 (TAT 6-24 HRS) Nasopharyngeal Nasopharyngeal Swab     Status: None   Collection Time: 08/25/20  5:52 PM   Specimen: Nasopharyngeal Swab  Result Value Ref Range Status   SARS Coronavirus 2 NEGATIVE NEGATIVE Final    Comment: (NOTE) SARS-CoV-2 target nucleic acids are NOT DETECTED.  The SARS-CoV-2 RNA is generally detectable in upper and lower respiratory specimens during the acute phase of infection. Negative results do not preclude SARS-CoV-2 infection, do not rule out co-infections with other pathogens, and should not be used as the sole basis for treatment or other patient management decisions. Negative results must be combined  with clinical observations, patient history, and epidemiological information. The expected result is Negative.  Fact Sheet for Patients: SugarRoll.be  Fact Sheet for Healthcare Providers: https://www.woods-mathews.com/  This test is not yet approved or cleared by the [place] FDA and  has been authorized for detection and/or diagnosis of SARS-CoV-2 by FDA under an Emergency Use Authorization (EUA). This EUA will remain  in effect (meaning this test can be used) for the duration of the COVID-19 declaration under Se ction 564(b)(1) of the Act, 21 U.S.C. section 360bbb-3(b)(1), unless the authorization is terminated or revoked sooner.  Performed at [facility], [place] [address], [place], [place] [address]   Surgical pcr screen     Status: None   Collection Time: 08/27/20  1:51 AM   Specimen: Nasal Mucosa; Nasal Swab  Result Value Ref Range Status   MRSA, PCR NEGATIVE NEGATIVE Final   Staphylococcus aureus NEGATIVE NEGATIVE Final    Comment: (NOTE) The Xpert SA Assay (FDA approved for NASAL specimens in patients 76 years of age and older), is one component of a comprehensive surveillance program. It is not intended to diagnose infection nor to guide or monitor treatment. Performed at [facility], [address] [place], [place] [address]   SARS CORONAVIRUS 2 (TAT 6-24 HRS) Nasopharyngeal Nasopharyngeal Swab     Status: None   Collection Time: 08/31/20  3:04 PM   Specimen: Nasopharyngeal Swab  Result Value Ref Range Status   SARS Coronavirus 2 NEGATIVE NEGATIVE Final    Comment: (NOTE) SARS-CoV-2 target nucleic acids are NOT DETECTED.  The SARS-CoV-2 RNA is generally detectable in upper and lower respiratory specimens during the acute phase of infection. Negative results do not preclude SARS-CoV-2 infection, do not rule out co-infections with other pathogens, and should not be used as the sole basis for treatment or other  patient management decisions. Negative results must be combined with clinical observations, patient history, and epidemiological information. The expected result is Negative.  Fact Sheet for Patients: SugarRoll.be  Fact Sheet for Healthcare Providers: https://www.woods-mathews.com/  This test is not yet approved or cleared by the [place] FDA and  has been authorized for detection and/or diagnosis of SARS-CoV-2 by FDA under an Emergency Use Authorization (EUA). This EUA will remain  in effect (meaning this test can be used) for the duration of the COVID-19 declaration under Se ction 564(b)(1) of the Act, 21 U.S.C. section 360bbb-3(b)(1), unless the authorization is terminated or revoked sooner.  Performed at [facility], [place] [address] [place], [place] [address]      Labs: BNP (last 3 results) Recent Labs    08/25/20 1603  BNP 0000000*   Basic Metabolic Panel: Recent Labs  Lab 08/25/20 1610 08/26/20 0459 08/27/20 0426 08/28/20 0424 08/29/20 0520  NA 134* 138 136 135 137  K 4.6 4.5 4.4 4.4 4.4  CL 98 99 98 99 101  CO2 28 28 30 29 30   GLUCOSE 141* 127* 101* 130* 95  BUN 23 27* 30* 24* 19  CREATININE 0.96 0.98 0.87 0.72 0.64  CALCIUM 8.8* 9.2 8.7* 8.1* 8.3*  MG  --  1.7 2.2 1.9 2.0  PHOS  --  3.2  --   --   --    Liver Function Tests: Recent Labs  Lab 08/25/20 1610 08/26/20 0459 08/28/20 0424 08/29/20 0520  AST 31 22 18 24   ALT 21 17 11 10   ALKPHOS 52 46 39 43  BILITOT 1.0 1.2 0.9 1.0  PROT 7.1 6.6 5.7* 6.1*  ALBUMIN 3.6 3.4* 2.8* 2.9*   No results for input(s): LIPASE, AMYLASE in the last 168 hours. No results for input(s): AMMONIA in the last 168 hours. CBC: Recent Labs  Lab 08/25/20 1610 08/26/20 0459 08/27/20 0426 08/28/20 0424 08/29/20 0520  WBC 11.3* 12.4* 10.4 9.2 7.6  NEUTROABS 8.6*  --   --   --   --   HGB 13.1 12.3 11.7* 9.1* 9.6*  HCT 43.0 40.3 39.5 29.5* 32.3*  MCV 96.6 97.6 98.0  97.4 98.8  PLT 169 148* 137* 125* 139*   Cardiac Enzymes: No results for input(s): CKTOTAL, CKMB, CKMBINDEX, TROPONINI in the last 168 hours. BNP: Invalid input(s): POCBNP CBG: Recent Labs  Lab 08/27/20 1318  GLUCAP 128*   D-Dimer No results for input(s): DDIMER in the last 72 hours. Hgb A1c No results for input(s): HGBA1C in the last 72 hours. Lipid Profile No results for input(s): CHOL, HDL, LDLCALC, TRIG, CHOLHDL, LDLDIRECT in the last 72 hours. Thyroid function studies No results for input(s): TSH, T4TOTAL, T3FREE, THYROIDAB in the last 72 hours.  Invalid input(s): FREET3 Anemia work up No results for input(s): VITAMINB12, FOLATE, FERRITIN, TIBC, IRON, RETICCTPCT in the last 72 hours. Urinalysis    Component Value Date/Time   COLORURINE DARK YELLOW 04/07/2020 1449   APPEARANCEUR SLIGHTLY CLOUDY (A) 04/07/2020 1449   LABSPEC 1.025 04/07/2020 1449   PHURINE 5.5 04/07/2020 1449   GLUCOSEU TRACE (A) 04/07/2020 1449   GLUCOSEU NEG mg/dL 03/08/2007 2031   HGBUR NEGATIVE 04/07/2020 1449   HGBUR negative 03/06/2008 0846   BILIRUBINUR NEGATIVE 06/09/2018 0136   KETONESUR TRACE (A) 04/07/2020 1449   PROTEINUR 1+ (A) 04/07/2020 1449   UROBILINOGEN 0.2 09/20/2013 1535   NITRITE POSITIVE (A) 04/07/2020 1449   LEUKOCYTESUR 2+ (A) 04/07/2020 1449   Sepsis Labs Invalid input(s): PROCALCITONIN,  WBC,  LACTICIDVEN Microbiology Recent Results (from the past 240 hour(s))  SARS CORONAVIRUS 2 (TAT 6-24 HRS) Nasopharyngeal Nasopharyngeal Swab     Status: None   Collection Time: 08/25/20  5:52 PM   Specimen: Nasopharyngeal Swab  Result Value Ref Range Status   SARS Coronavirus 2 NEGATIVE NEGATIVE Final    Comment: (NOTE) SARS-CoV-2 target nucleic acids are NOT DETECTED.  The SARS-CoV-2 RNA is generally detectable in upper and lower respiratory specimens during the acute phase of infection. Negative results do not preclude SARS-CoV-2 infection, do not rule out co-infections with  other pathogens, and should not be used as the sole basis for treatment or other patient management decisions. Negative results must be combined with clinical observations, patient history, and epidemiological information. The expected result is Negative.  Fact Sheet for Patients: SugarRoll.be  Fact Sheet for Healthcare Providers: https://www.woods-mathews.com/  This test is not yet approved or cleared by the [place] FDA and  has been authorized for detection and/or diagnosis of SARS-CoV-2 by FDA under an Emergency Use Authorization (EUA). This EUA will remain  in effect (meaning this test can be used) for the duration of the COVID-19 declaration under Se ction 564(b)(1) of the Act, 21 U.S.C. section 360bbb-3(b)(1), unless the authorization is terminated or revoked sooner.  Performed at [facility], [place] [address] [place], [place] [address]   Surgical pcr screen     Status: None   Collection Time: 08/27/20  1:51 AM   Specimen: Nasal Mucosa; Nasal Swab  Result Value Ref Range Status   MRSA, PCR NEGATIVE NEGATIVE Final   Staphylococcus aureus NEGATIVE NEGATIVE Final    Comment: (NOTE) The Xpert SA Assay (FDA approved for NASAL specimens in patients 47 years of age and older), is one component of a comprehensive surveillance program. It is not intended to diagnose infection nor to guide or monitor treatment. Performed at [facility], [address] [place], [place] [address]   SARS CORONAVIRUS 2 (TAT 6-24 HRS) Nasopharyngeal Nasopharyngeal Swab     Status: None   Collection Time: 08/31/20  3:04 PM   Specimen: Nasopharyngeal Swab  Result Value Ref Range Status   SARS Coronavirus 2 NEGATIVE NEGATIVE Final    Comment: (NOTE) SARS-CoV-2 target nucleic acids are NOT DETECTED.  The SARS-CoV-2 RNA is generally detectable in upper and lower respiratory specimens during the acute phase of infection. Negative results do not  preclude SARS-CoV-2 infection, do not rule out co-infections with other pathogens, and should not be used as the sole basis for treatment or other patient management decisions. Negative results must be combined with clinical observations, patient history, and epidemiological information. The expected result is Negative.  Fact Sheet for Patients: SugarRoll.be  Fact Sheet for Healthcare Providers: https://www.woods-mathews.com/  This test is not yet approved or cleared by the [place] FDA and  has been authorized for detection and/or diagnosis of SARS-CoV-2 by FDA under an Emergency Use Authorization (EUA). This EUA will remain  in effect (meaning this test can be used) for the duration of the COVID-19 declaration under Se ction 564(b)(1) of the Act, 21 U.S.C. section 360bbb-3(b)(1), unless the authorization is terminated or revoked sooner.  Performed at [facility], [place] [address], [place], [place] [address]    Time coordinating discharge: 35 mins   SIGNED:  Irwin Brakeman, MD  [facility] 09/01/2020, 11:31 AM How to contact the [facility] Attending or Consulting provider [place] or covering provider during after hours [place], for  this patient?  1. Check the care team in [facility] and look for a) attending/consulting TR[facility] provider listed and b) the [facility] team listed 2. Log into [email or web address] and use [place]'s universal password to access. If you do not have the password, please contact the hospital operator. 3. Locate the [facility] provider you are looking for under [facility] and page to a number that you can be directly reached. 4. If you still have difficulty reaching the provider, please page the [facility] (Director on Call) for the Hospitalists listed on amion for assistance.

## 2020-09-01 NOTE — Progress Notes (Signed)
Pt discharged via EMS to [facility]. Pt was able to ambulate to stretcher with walker and standby assistance. Pt with no personal belongings at bedside at discharge. Foam wedge sent with pt to SNF rehab. Pt's daughter notified of pt's discharge.

## 2020-09-04 ENCOUNTER — Telehealth: Payer: Self-pay | Admitting: Orthopedic Surgery

## 2020-09-04 NOTE — Telephone Encounter (Signed)
[name] called back and I confirmed the date that was on file for this patient.

## 2020-09-04 NOTE — Telephone Encounter (Signed)
I received a call from [place] to confirm an appointment. I left a message for Juliann Pulse to call the office back.

## 2020-09-11 ENCOUNTER — Ambulatory Visit: Payer: 59

## 2020-09-11 ENCOUNTER — Other Ambulatory Visit: Payer: Self-pay

## 2020-09-11 ENCOUNTER — Ambulatory Visit (INDEPENDENT_AMBULATORY_CARE_PROVIDER_SITE_OTHER): Payer: 59 | Admitting: Orthopedic Surgery

## 2020-09-11 ENCOUNTER — Encounter: Payer: Self-pay | Admitting: Orthopedic Surgery

## 2020-09-11 VITALS — BP 104/69 | HR 83 | Ht 65.0 in | Wt 197.0 lb

## 2020-09-11 DIAGNOSIS — S72002S Fracture of unspecified part of neck of left femur, sequela: Secondary | ICD-10-CM

## 2020-09-11 DIAGNOSIS — S72002D Fracture of unspecified part of neck of left femur, subsequent encounter for closed fracture with routine healing: Secondary | ICD-10-CM

## 2020-09-11 DIAGNOSIS — Z96649 Presence of unspecified artificial hip joint: Secondary | ICD-10-CM | POA: Diagnosis not present

## 2020-09-11 NOTE — Progress Notes (Signed)
Orthopaedic Postop Note  Assessment: [name] is a 87 y.o. female s/p Left hip hemiarthroplasty  DOS: 08/27/2020  Plan: Jodell Cipro removed, steri strips placed Continue using hip abduction pillow while in bed until 6 weeks after surgery Continue posterior hip precautions until 3 months postop Continue with DVT prophylaxis for at least 6 weeks after surgery WBAT on the operative extremity Follow up in 4 weeks; call with any issues   Follow-up: Return in about 4 weeks (around 10/09/2020). XR at next visit: AP pelvis and Left hip  Subjective:  Chief Complaint  Patient presents with  . s/p L hip hemiarthroplasty    History of Present Illness: [name] is a 87 y.o. female who presents following the above stated procedure. This is her first postoperative visit.  She is recovering well.  She is in an assisted living facility.  Her pain is controlled.  She is more alert.  She is on oxygen through nasal cannula.  She is not using the hip abduction pillow at all times while in bed.  She says that she has been able to ambulate in the hall with assistance.   Review of Systems: No fevers or chills No numbness or tingling No Chest Pain No shortness of breath   Objective: BP 104/69   Pulse 83   Ht 5\' 5"  (1.651 m)   Wt 197 lb (89.4 kg)   BMI 32.78 kg/m   Physical Exam:  Alert and oriented.  No acute distress.   Seated in a wheelchair.  Surgical incision is healing well. No surrounding erythema or drainage.  Mild tenderness over the lateral thigh.  She tolerates gentle range of motion of the hip.  2+ DP pulse.  Active motion of the EHL/TA.  Sensation to dorsum of foot intact.   IMAGING: I personally ordered and reviewed the following images:  XR of the Left hip and AP pelvis demonstrates a cemented hip arthroplasty in good position.  The hip remains reduced.  There is no evidence of implant subsidence.  No acute fractures are noted.  Impression: Left hip  hemiarthroplasty in excellent position   Mordecai Rasmussen, MD 09/11/2020 8:55 AM

## 2020-09-17 ENCOUNTER — Other Ambulatory Visit: Payer: Self-pay | Admitting: Family Medicine

## 2020-09-17 ENCOUNTER — Other Ambulatory Visit: Payer: Self-pay | Admitting: Podiatry

## 2020-10-06 ENCOUNTER — Telehealth: Payer: Self-pay | Admitting: *Deleted

## 2020-10-06 NOTE — Telephone Encounter (Signed)
Received call from [place], [facility] PT with [facility].  Requested VO for [facility] PT 2x weekly x4 weeks, 1x weekly x4 weeks for S/P rehab discharge and oxygen education. VO given.   States that patient is on O2 2L/min via [place] continuously. States that SpO2 at rest 100% and SpO2 97% with activity.   Advised that we cannot give orders for oxygen until patient is seen in office.

## 2020-10-07 ENCOUNTER — Ambulatory Visit (INDEPENDENT_AMBULATORY_CARE_PROVIDER_SITE_OTHER): Payer: 59 | Admitting: Nurse Practitioner

## 2020-10-07 ENCOUNTER — Other Ambulatory Visit: Payer: Self-pay

## 2020-10-07 VITALS — BP 110/82 | HR 98 | Temp 85.0°F | Ht 65.0 in | Wt 188.0 lb

## 2020-10-07 DIAGNOSIS — R410 Disorientation, unspecified: Secondary | ICD-10-CM | POA: Diagnosis not present

## 2020-10-07 DIAGNOSIS — N3000 Acute cystitis without hematuria: Secondary | ICD-10-CM

## 2020-10-07 LAB — URINALYSIS, ROUTINE W REFLEX MICROSCOPIC
Bilirubin Urine: NEGATIVE
Glucose, UA: NEGATIVE
Hgb urine dipstick: NEGATIVE
Hyaline Cast: NONE SEEN /LPF
Ketones, ur: NEGATIVE
Nitrite: POSITIVE — AB
Protein, ur: NEGATIVE
RBC / HPF: NONE SEEN /HPF (ref 0–2)
Specific Gravity, Urine: 1.02 (ref 1.001–1.035)
pH: 6 (ref 5.0–8.0)

## 2020-10-07 LAB — MICROSCOPIC MESSAGE

## 2020-10-07 MED ORDER — NITROFURANTOIN MONOHYD MACRO 100 MG PO CAPS: 100.0000 mg | ORAL_CAPSULE | Freq: Two times a day (BID) | ORAL | 0 refills | Status: DC

## 2020-10-07 NOTE — Progress Notes (Signed)
Subjective:    Patient ID: [name], female    DOB: Aug 16, 1933, 87 y.o.   MRN: [id number]  HPI: [name] is a 87 y.o. female presenting with 2 daughters for urinary tract infection symptoms - "talking out of her head."  She lives with son.    Chief Complaint  Patient presents with   Urinary Tract Infection    Having delirium for the past days, thinks they may have uti   URINARY SYMPTOMS Daughters say when she gets "bad" UTI, she has delirium.  Last UTI was in December and she ended up needing to be hospitalized.  Duration: 2 days Dysuria: no Urinary frequency: no Urgency: no Small volume voids: no Symptom severity: mild Urinary incontinence:  yes; can be incontinent at times at baseline Foul odor: yes Hematuria: no Abdominal pain: yes Back pain:  has chronic midline low back pain at baseline- no unilateral pain Suprapubic pain/pressure: yes Flank pain: no Fever:  No Nausea: bo Vomiting: no Relief with cranberry juice: not tried Relief with pyridium: not tried Status: worse Previous urinary tract infection: yes Recurrent urinary tract infection: no Sexual activity: not sexually active History of sexually transmitted disease: no Treatments attempted: nothing tried   Needs humidity added to oxygen.  Has been on oxygen since discharge from the hospital for hip surgery.  Reports drops to the 70s without oxygen.  Allergies  Allergen Reactions   Sulfonamide Derivatives Hives and Rash    Outpatient Encounter Medications as of 10/07/2020  Medication Sig   acetaminophen (TYLENOL) 325 MG tablet Take 2 tablets (650 mg total) by mouth every 6 (six) hours.   ALPRAZolam (XANAX) 0.25 MG tablet Take 1 tablet (0.25 mg total) by mouth 2 (two) times daily as needed for anxiety.   ammonium lactate (AMLACTIN) 12 % cream Apply topically as needed for dry skin.   aspirin EC 81 MG tablet Take 1 po BID x 6 weeks then resume 1 po daily   atenolol (TENORMIN) 50 MG tablet TAKE  1 TABLET BY MOUTH DAILY   bisacodyl (DULCOLAX) 10 MG suppository Place 1 suppository (10 mg total) rectally daily as needed for moderate constipation.   Calcium Carbonate-Vitamin D3 600-400 MG-UNIT TABS Take 1 tablet by mouth daily.   diclofenac sodium (VOLTAREN) 1 % GEL Apply 2 g topically 4 (four) times daily. Rub into affected area of foot 2 to 4 times daily   DULoxetine (CYMBALTA) 30 MG capsule TAKE 1 CAPSULE BY MOUTH DAILY ALONG WITH 60 MG FOR TOTAL DAILY DOSE OF 90 MG   DULoxetine (CYMBALTA) 60 MG capsule TAKE 1 CAPSULE BY MOUTH DAILY ALONG WITH 30 MG FOR TOTAL DAILY DOSE OF 90 MG   gabapentin (NEURONTIN) 800 MG tablet Take 1 tablet (800 mg total) by mouth 3 (three) times daily.   nitrofurantoin, macrocrystal-monohydrate, (MACROBID) 100 MG capsule Take 1 capsule (100 mg total) by mouth 2 (two) times daily.   pantoprazole (PROTONIX) 40 MG tablet TAKE 1 TABLET BY MOUTH ONCE DAILY   rosuvastatin (CRESTOR) 40 MG tablet TAKE 1 TABLET BY MOUTH EACH EVENING   senna-docusate (SENOKOT-S) 8.6-50 MG tablet Take 1 tablet by mouth 2 (two) times daily.   No facility-administered encounter medications on file as of 10/07/2020.    Patient Active Problem List   Diagnosis Date Noted   Closed left hip fracture ([place]) 08/25/2020   Leukocytosis 08/25/2020   Hyperglycemia 08/25/2020   Elevated brain natriuretic peptide (BNP) level 08/25/2020   GERD (gastroesophageal reflux disease) 08/25/2020  Cerebrovascular accident (CVA) due to stenosis of basilar artery ([place]) 07/17/2019   Dyspnea 91/47/8295   Acute diastolic CHF (congestive heart failure) ([place]) 07/09/2019   Chest pain    Coronary artery calcification seen on CT scan    Gait abnormality 01/24/2019   Tremor 01/24/2019   Memory loss 01/24/2019   Atelectasis of left lung 04/06/2018   Acute encephalopathy 02/09/2018   Urinary tract infection due to extended-spectrum beta lactamase (ESBL) producing Escherichia coli 02/02/2018   Atrial fibrillation,  chronic ([place]) 02/02/2018   Constipation 01/31/2018   Urolithiasis 01/30/2018   T12 compression fracture ([place]) 09/04/2017   Fall at home, initial encounter 07/03/2017   Tachycardia 02/03/2017   Chronic pain syndrome 02/02/2017   Abnormal EKG 11/21/2013   [name] (abdominal aortic aneurysm) ([place]) 09/30/2013   OSA (obstructive sleep apnea) 06/29/2012   Dementia ([place]) 06/29/2012   Pre-diabetes 06/29/2012   Hypoxia, sleep related 06/29/2012   RA (rheumatoid arthritis) ([place]) 06/29/2012   Nonspecific abnormal results of cardiovascular function study 06/02/2011   Nonspecific abnormal electrocardiogram (ECG) (EKG) 05/18/2011   Osteoporosis 04/09/2008   Hyperlipidemia 05/12/2006   DEPENDENT EDEMA, LEGS, BILATERAL 05/12/2006   PERIPHERAL NEUROPATHY 03/11/2006   Essential hypertension 03/11/2006    Past Medical History:  Diagnosis Date   [name] (abdominal aortic aneurysm) ([place]) 09/2013   3.6 cm   Acute and chronic respiratory failure with hypoxia (HCC)    Acute delirium    Acute encephalopathy    Acute pyelonephritis    Atrial fibrillation (HCC)    Chronic pain syndrome    Constipation    Dementia (HCC)    Dependent edema    bilateral legs    Depression    Diabetes mellitus    pre-diabetes   Diverticulosis    Diverticulosis    Dysuria    E coli bacteremia    Fall    Gross hematuria    Hydronephrosis with renal and ureteral calculous obstruction    Hyperlipidemia    Hypertension    Hypoxemia    Idiopathic peripheral autonomic neuropathy    Infestation by bed bug    Nephrolithiasis    OSA on CPAP    Osteoarthritis    Osteopenia    T=  -2.1 in hip   Persistent mood (affective) disorder, unspecified (HCC)    Prediabetes    Rheumatoid arthritis ([place])    Rheumatoid arthritis(714.0)    S/P carpal tunnel release    Sepsis secondary to UTI [facility])    Spinal stenosis    Unspecified fracture of fifth lumbar vertebra, initial encounter for closed fracture ([place])    Unspecified fracture  of t11-T12 vertebra, initial encounter for closed fracture ([place])    Urinary incontinence    Urinary tract infection    hx of    Urolithiasis     Relevant past medical, surgical, family and social history reviewed and updated as indicated. Interim medical history since our last visit reviewed.  Review of Systems Per HPI unless specifically indicated above     Objective:    BP 110/82   Pulse 98   Temp (!) 85 F (29.4 C)   Ht 5\' 5"  (1.651 m)   Wt 188 lb (85.3 kg)   SpO2 98%   BMI 31.28 kg/m   Wt Readings from Last 3 Encounters:  10/07/20 188 lb (85.3 kg)  09/11/20 197 lb (89.4 kg)  08/29/20 197 lb 5 oz (89.5 kg)    Physical Exam Vitals and nursing note reviewed.  Constitutional:  General: She is not in acute distress.    Appearance: Normal appearance. She is not toxic-appearing.  Pulmonary:     Breath sounds: Rales present.  Abdominal:     General: Abdomen is flat. Bowel sounds are normal. There is no distension.     Palpations: Abdomen is soft. There is no mass.     Tenderness: There is no right CVA tenderness or left CVA tenderness.  Skin:    General: Skin is warm and dry.     Capillary Refill: Capillary refill takes less than 2 seconds.     Coloration: Skin is not jaundiced or pale.     Findings: No erythema.  Neurological:     Mental Status: She is alert and oriented to person, place, and time.     Motor: Weakness present.     Gait: Gait abnormal (patient wheelchair bound at baseline).  Psychiatric:        Attention and Perception: She perceives auditory hallucinations.        Mood and Affect: Mood and affect normal.        Behavior: Behavior is cooperative.        Thought Content: Thought content is delusional.        Cognition and Memory: Cognition is impaired. Memory is impaired.       Assessment & Plan:  1. Acute cystitis without hematuria Acute.  UA dipstick today showed positive nitrites, trace leukocyte esterace, 6-10 WBC, many bacteria.  Will  send urine for C&S and in meantime treat with Macrobid.  Previous UTI in December 2021 with resistance to other antibiotics.   - Urinalysis, Routine w reflex microscopic - nitrofurantoin, macrocrystal-monohydrate, (MACROBID) 100 MG capsule; Take 1 capsule (100 mg total) by mouth 2 (two) times daily.  Dispense: 10 capsule; Refill: 0  2. Acute delirium Acute.  Dementia at baseline.  Differentials including acute UTI, pneumonia, progression of dementia.  UA today + for nitrates and many bacteria under microscope.  Most likely acute UTI, however given rales on examination, concern for pneumonia vs. Atelectasis.  High risk for pneumonia given recently discharged from rehabilitation, age, and shallow breathing.  Encouraged to obtain chest x-ray.   - DG Chest 2 View; Future    Follow up plan: Return if symptoms worsen or fail to improve.

## 2020-10-09 ENCOUNTER — Encounter: Payer: Self-pay | Admitting: Nurse Practitioner

## 2020-10-09 ENCOUNTER — Encounter: Payer: 59 | Admitting: Orthopedic Surgery

## 2020-10-09 LAB — URINE CULTURE
MICRO NUMBER:: 11983672
SPECIMEN QUALITY:: ADEQUATE

## 2020-10-13 ENCOUNTER — Telehealth: Payer: Self-pay

## 2020-10-13 ENCOUNTER — Ambulatory Visit (INDEPENDENT_AMBULATORY_CARE_PROVIDER_SITE_OTHER): Payer: 59 | Admitting: Orthopedic Surgery

## 2020-10-13 ENCOUNTER — Encounter: Payer: Self-pay | Admitting: Orthopedic Surgery

## 2020-10-13 ENCOUNTER — Other Ambulatory Visit: Payer: Self-pay

## 2020-10-13 ENCOUNTER — Ambulatory Visit: Payer: 59

## 2020-10-13 DIAGNOSIS — Z96642 Presence of left artificial hip joint: Secondary | ICD-10-CM | POA: Diagnosis not present

## 2020-10-13 DIAGNOSIS — Z96649 Presence of unspecified artificial hip joint: Secondary | ICD-10-CM

## 2020-10-13 DIAGNOSIS — S72002D Fracture of unspecified part of neck of left femur, subsequent encounter for closed fracture with routine healing: Secondary | ICD-10-CM | POA: Diagnosis not present

## 2020-10-13 NOTE — Progress Notes (Signed)
Orthopaedic Postop Note  Assessment: [name] is a 87 y.o. female s/p Left hip hemiarthroplasty  DOS: 08/27/2020  Plan: Healing well.  Continue with posterior hip precautions.  Ambulate with the assistance of a walker.  In home PT and OT.  WBAT on the operative extremity.  If still having left knee pain at the next visit, can consider a steroid injection.    Follow up in 6 weeks; call with any issues   Follow-up: Return in about 6 weeks (around 11/24/2020). XR at next visit: AP pelvis and Left hip  Subjective:  Chief Complaint  Patient presents with   Post-op Follow-up    Left hip hemi / 08/27/20     History of Present Illness: [name] is a 87 y.o. female who presents following the above stated procedure. She is approximately 6 weeks out from surgery.  She is now at home and doing well.  She is working with PT and OT at home.  She is having left knee pain, but states she has arthritis in the knee.  She previously had a R TKA and was told she needed a L TKA, but did not want to proceed with surgery.  No issues with her surgical incision.  Review of Systems: No fevers or chills No numbness or tingling No Chest Pain No shortness of breath   Objective: There were no vitals taken for this visit.  Physical Exam:  Alert and oriented.  No acute distress.   Seated in a wheelchair  Surgical incision is healing well. No surrounding erythema or drainage.  Some diffuse mild tenderness over the lateral thigh.  Able to maintain a straight leg raise.  No pain in the hip with axial loading.  Tolerates gentle range of motion of the hip.   IMAGING: I personally ordered and reviewed the following images:  XR of the Left hip and AP pelvis demonstrates a cemented hip arthroplasty in good position.  No interval displacement.  Hip remains reduced.  No evidence of hardware failure or subsidence  Impression: Left hip hemiarthroplasty in excellent position   Mordecai Rasmussen,  MD 10/13/2020 2:00 PM

## 2020-10-13 NOTE — Telephone Encounter (Signed)
Forms faxed to [facility] home health and poc

## 2020-10-22 ENCOUNTER — Other Ambulatory Visit: Payer: Self-pay

## 2020-10-22 ENCOUNTER — Ambulatory Visit (INDEPENDENT_AMBULATORY_CARE_PROVIDER_SITE_OTHER): Payer: 59 | Admitting: Podiatry

## 2020-10-22 DIAGNOSIS — M79676 Pain in unspecified toe(s): Secondary | ICD-10-CM | POA: Diagnosis not present

## 2020-10-22 DIAGNOSIS — E1142 Type 2 diabetes mellitus with diabetic polyneuropathy: Secondary | ICD-10-CM | POA: Diagnosis not present

## 2020-10-22 DIAGNOSIS — S91312A Laceration without foreign body, left foot, initial encounter: Secondary | ICD-10-CM | POA: Diagnosis not present

## 2020-10-22 DIAGNOSIS — B351 Tinea unguium: Secondary | ICD-10-CM

## 2020-10-22 MED ORDER — MUPIROCIN 2 % EX OINT: 1.0000 "application " | TOPICAL_OINTMENT | Freq: Two times a day (BID) | CUTANEOUS | 0 refills | Status: DC

## 2020-10-23 ENCOUNTER — Telehealth: Payer: Self-pay | Admitting: Family Medicine

## 2020-10-23 ENCOUNTER — Ambulatory Visit (INDEPENDENT_AMBULATORY_CARE_PROVIDER_SITE_OTHER): Payer: 59

## 2020-10-23 VITALS — BP 122/78 | HR 77 | Temp 97.6°F | Ht 65.0 in | Wt 164.5 lb

## 2020-10-23 DIAGNOSIS — Z0000 Encounter for general adult medical examination without abnormal findings: Secondary | ICD-10-CM

## 2020-10-23 NOTE — Patient Instructions (Signed)
[name] , Thank you for taking time to come for your Medicare Wellness Visit. I appreciate your ongoing commitment to your health goals. Please review the following plan we discussed and let me know if I can assist you in the future.   Screening recommendations/referrals: Colonoscopy: No longer required  Mammogram: No longer required  Bone Density: No longer required  Recommended yearly ophthalmology/optometry visit for glaucoma screening and checkup Recommended yearly dental visit for hygiene and checkup  Vaccinations: Influenza vaccine: Up to date, next due fall 2022  Pneumococcal vaccine: Completed series  Tdap vaccine: Currently due, you may await and injury to receive  Shingles vaccine: Currently due, if you would like to receive we recommend that you do so at your local pharmacy     Advanced directives: Advance directive discussed with you today. Even though you declined this today please call our office should you change your mind and we can give you the proper paperwork for you to fill out.   Conditions/risks identified: None   Next appointment: None    Preventive Care 65 Years and Older, Female Preventive care refers to lifestyle choices and visits with your health care provider that can promote health and wellness. What does preventive care include? A yearly physical exam. This is also called an annual well check. Dental exams once or twice a year. Routine eye exams. Ask your health care provider how often you should have your eyes checked. Personal lifestyle choices, including: Daily care of your teeth and gums. Regular physical activity. Eating a healthy diet. Avoiding tobacco and drug use. Limiting alcohol use. Practicing safe sex. Taking low-dose aspirin every day. Taking vitamin and mineral supplements as recommended by your health care provider. What happens during an annual well check? The services and screenings done by your health care provider during your  annual well check will depend on your age, overall health, lifestyle risk factors, and family history of disease. Counseling  Your health care provider may ask you questions about your: Alcohol use. Tobacco use. Drug use. Emotional well-being. Home and relationship well-being. Sexual activity. Eating habits. History of falls. Memory and ability to understand (cognition). Work and work Statistician. Reproductive health. Screening  You may have the following tests or measurements: Height, weight, and BMI. Blood pressure. Lipid and cholesterol levels. These may be checked every 5 years, or more frequently if you are over 56 years old. Skin check. Lung cancer screening. You may have this screening every year starting at age 96 if you have a 30-pack-year history of smoking and currently smoke or have quit within the past 15 years. Fecal occult blood test (FOBT) of the stool. You may have this test every year starting at age 47. Flexible sigmoidoscopy or colonoscopy. You may have a sigmoidoscopy every 5 years or a colonoscopy every 10 years starting at age 27. Hepatitis C blood test. Hepatitis B blood test. Sexually transmitted disease (STD) testing. Diabetes screening. This is done by checking your blood sugar (glucose) after you have not eaten for a while (fasting). You may have this done every 1-3 years. Bone density scan. This is done to screen for osteoporosis. You may have this done starting at age 58. Mammogram. This may be done every 1-2 years. Talk to your health care provider about how often you should have regular mammograms. Talk with your health care provider about your test results, treatment options, and if necessary, the need for more tests. Vaccines  Your health care provider may recommend certain vaccines,  such as: Influenza vaccine. This is recommended every year. Tetanus, diphtheria, and acellular pertussis (Tdap, Td) vaccine. You may need a Td booster every 10  years. Zoster vaccine. You may need this after age 48. Pneumococcal 13-valent conjugate (PCV13) vaccine. One dose is recommended after age 48. Pneumococcal polysaccharide (PPSV23) vaccine. One dose is recommended after age 40. Talk to your health care provider about which screenings and vaccines you need and how often you need them. This information is not intended to replace advice given to you by your health care provider. Make sure you discuss any questions you have with your health care provider. Document Released: 05/15/2015 Document Revised: 01/06/2016 Document Reviewed: 02/17/2015 Elsevier Interactive Patient Education  2017 [place] Prevention in the Home Falls can cause injuries. They can happen to people of all ages. There are many things you can do to make your home safe and to help prevent falls. What can I do on the outside of my home? Regularly fix the edges of walkways and driveways and fix any cracks. Remove anything that might make you trip as you walk through a door, such as a raised step or threshold. Trim any bushes or trees on the path to your home. Use bright outdoor lighting. Clear any walking paths of anything that might make someone trip, such as rocks or tools. Regularly check to see if handrails are loose or broken. Make sure that both sides of any steps have handrails. Any raised decks and porches should have guardrails on the edges. Have any leaves, snow, or ice cleared regularly. Use sand or salt on walking paths during winter. Clean up any spills in your garage right away. This includes oil or grease spills. What can I do in the bathroom? Use night lights. Install grab bars by the toilet and in the tub and shower. Do not use towel bars as grab bars. Use non-skid mats or decals in the tub or shower. If you need to sit down in the shower, use a plastic, non-slip stool. Keep the floor dry. Clean up any water that spills on the floor as soon as it  happens. Remove soap buildup in the tub or shower regularly. Attach bath mats securely with double-sided non-slip rug tape. Do not have throw rugs and other things on the floor that can make you trip. What can I do in the bedroom? Use night lights. Make sure that you have a light by your bed that is easy to reach. Do not use any sheets or blankets that are too big for your bed. They should not hang down onto the floor. Have a firm chair that has side arms. You can use this for support while you get dressed. Do not have throw rugs and other things on the floor that can make you trip. What can I do in the kitchen? Clean up any spills right away. Avoid walking on wet floors. Keep items that you use a lot in easy-to-reach places. If you need to reach something above you, use a strong step stool that has a grab bar. Keep electrical cords out of the way. Do not use floor polish or wax that makes floors slippery. If you must use wax, use non-skid floor wax. Do not have throw rugs and other things on the floor that can make you trip. What can I do with my stairs? Do not leave any items on the stairs. Make sure that there are handrails on both sides of the stairs and  use them. Fix handrails that are broken or loose. Make sure that handrails are as long as the stairways. Check any carpeting to make sure that it is firmly attached to the stairs. Fix any carpet that is loose or worn. Avoid having throw rugs at the top or bottom of the stairs. If you do have throw rugs, attach them to the floor with carpet tape. Make sure that you have a light switch at the top of the stairs and the bottom of the stairs. If you do not have them, ask someone to add them for you. What else can I do to help prevent falls? Wear shoes that: Do not have high heels. Have rubber bottoms. Are comfortable and fit you well. Are closed at the toe. Do not wear sandals. If you use a stepladder: Make sure that it is fully opened.  Do not climb a closed stepladder. Make sure that both sides of the stepladder are locked into place. Ask someone to hold it for you, if possible. Clearly mark and make sure that you can see: Any grab bars or handrails. First and last steps. Where the edge of each step is. Use tools that help you move around (mobility aids) if they are needed. These include: Canes. Walkers. Scooters. Crutches. Turn on the lights when you go into a dark area. Replace any light bulbs as soon as they burn out. Set up your furniture so you have a clear path. Avoid moving your furniture around. If any of your floors are uneven, fix them. If there are any pets around you, be aware of where they are. Review your medicines with your doctor. Some medicines can make you feel dizzy. This can increase your chance of falling. Ask your doctor what other things that you can do to help prevent falls. This information is not intended to replace advice given to you by your health care provider. Make sure you discuss any questions you have with your health care provider. Document Released: 02/12/2009 Document Revised: 09/24/2015 Document Reviewed: 05/23/2014 Elsevier Interactive Patient Education  2017 [employer].

## 2020-10-23 NOTE — Progress Notes (Signed)
Subjective:   Merl Guardino is a 87 y.o. female who presents for Medicare Annual (Subsequent) preventive examination.  Review of Systems    N/A  Cardiac Risk Factors include: advanced age (>67men, >90 women);hypertension;dyslipidemia;diabetes mellitus     Objective:    Today's Vitals   10/23/20 0852  BP: 122/78  Pulse: 77  Temp: 97.6 F (36.4 C)  TempSrc: Temporal  SpO2: 98%  Weight: 164 lb 8 oz (74.6 kg)  Height: 5\' 5"  (1.651 m)   Body mass index is 27.37 kg/m.  Advanced Directives 10/23/2020 08/27/2020 08/25/2020 06/08/2018 04/06/2018 04/06/2018 02/23/2018  Does Patient Have a Medical Advance Directive? No No No No No No Yes  Type of Advance Directive - - - - - - Out of facility DNR (pink MOST or yellow form)  Does patient want to make changes to medical advance directive? - - - - - - No - Patient declined  Would patient like information on creating a medical advance directive? No - Patient declined No - Patient declined No - Patient declined No - Patient declined No - Patient declined - No - Patient declined    Current Medications (verified) Outpatient Encounter Medications as of 10/23/2020  Medication Sig   acetaminophen (TYLENOL) 325 MG tablet Take 2 tablets (650 mg total) by mouth every 6 (six) hours.   albuterol (VENTOLIN HFA) 108 (90 Base) MCG/ACT inhaler Inhale into the lungs.   ALPRAZolam (XANAX) 0.25 MG tablet Take 1 tablet (0.25 mg total) by mouth 2 (two) times daily as needed for anxiety.   ammonium lactate (AMLACTIN) 12 % cream Apply topically as needed for dry skin.   ammonium lactate (LAC-HYDRIN) 12 % lotion Apply topically.   aspirin EC 81 MG tablet Take 1 po BID x 6 weeks then resume 1 po daily   atenolol (TENORMIN) 50 MG tablet TAKE 1 TABLET BY MOUTH DAILY   bisacodyl (DULCOLAX) 10 MG suppository Place 1 suppository (10 mg total) rectally daily as needed for moderate constipation.   Calcium Carbonate-Vitamin D3 600-400 MG-UNIT TABS Take 1 tablet by mouth  daily.   diclofenac sodium (VOLTAREN) 1 % GEL Apply 2 g topically 4 (four) times daily. Rub into affected area of foot 2 to 4 times daily   DULoxetine (CYMBALTA) 30 MG capsule TAKE 1 CAPSULE BY MOUTH DAILY ALONG WITH 60 MG FOR TOTAL DAILY DOSE OF 90 MG   DULoxetine (CYMBALTA) 60 MG capsule TAKE 1 CAPSULE BY MOUTH DAILY ALONG WITH 30 MG FOR TOTAL DAILY DOSE OF 90 MG   fluticasone (FLONASE) 50 MCG/ACT nasal spray Place 1 spray into both nostrils daily.   furosemide (LASIX) 40 MG tablet Take 40 mg by mouth daily.   gabapentin (NEURONTIN) 300 MG capsule Take 1 capsule by mouth 3 (three) times daily.   gabapentin (NEURONTIN) 800 MG tablet Take 1 tablet (800 mg total) by mouth 3 (three) times daily.   mupirocin ointment (BACTROBAN) 2 % Apply 1 application topically 2 (two) times daily.   oxyCODONE (OXY IR/ROXICODONE) 5 MG immediate release tablet Take by mouth.   pantoprazole (PROTONIX) 40 MG tablet TAKE 1 TABLET BY MOUTH ONCE DAILY   rosuvastatin (CRESTOR) 40 MG tablet TAKE 1 TABLET BY MOUTH EACH EVENING   MUCINEX 600 MG 12 hr tablet Take 600 mg by mouth 2 (two) times daily. (Patient not taking: Reported on 10/23/2020)   senna-docusate (SENOKOT-S) 8.6-50 MG tablet Take 1 tablet by mouth 2 (two) times daily. (Patient not taking: Reported on 10/23/2020)   [DISCONTINUED]  nitrofurantoin, macrocrystal-monohydrate, (MACROBID) 100 MG capsule Take 1 capsule (100 mg total) by mouth 2 (two) times daily.   No facility-administered encounter medications on file as of 10/23/2020.    Allergies (verified) Sulfonamide derivatives   History: Past Medical History:  Diagnosis Date   [name] (abdominal aortic aneurysm) ([place]) 09/2013   3.6 cm   Acute and chronic respiratory failure with hypoxia (HCC)    Acute delirium    Acute encephalopathy    Acute pyelonephritis    Atrial fibrillation (HCC)    Chronic pain syndrome    Constipation    Dementia (HCC)    Dependent edema    bilateral legs    Depression     Diabetes mellitus    pre-diabetes   Diverticulosis    Diverticulosis    Dysuria    E coli bacteremia    Fall    Gross hematuria    Hydronephrosis with renal and ureteral calculous obstruction    Hyperlipidemia    Hypertension    Hypoxemia    Idiopathic peripheral autonomic neuropathy    Infestation by bed bug    Nephrolithiasis    OSA on CPAP    Osteoarthritis    Osteopenia    T=  -2.1 in hip   Persistent mood (affective) disorder, unspecified (HCC)    Prediabetes    Rheumatoid arthritis (HCC)    Rheumatoid arthritis(714.0)    S/P carpal tunnel release    Sepsis secondary to UTI [facility])    Spinal stenosis    Unspecified fracture of fifth lumbar vertebra, initial encounter for closed fracture ([place])    Unspecified fracture of t11-T12 vertebra, initial encounter for closed fracture ([place])    Urinary incontinence    Urinary tract infection    hx of    Urolithiasis    Past Surgical History:  Procedure Laterality Date   APPENDECTOMY     CARPAL TUNNEL RELEASE  12/27/2006   Right subcutaneous ulnar nerve transfer -- Right open carpal tunnel release   CHOLECYSTECTOMY     CYSTOSCOPY W/ URETERAL STENT PLACEMENT Right 01/30/2018   Procedure: CYSTOSCOPY WITH STENT REPLACEMENT retrograde pylegram;  Surgeon: Ardis Hughs, MD;  Location: [facility];  Service: Urology;  Laterality: Right;   CYSTOSCOPY WITH URETEROSCOPY AND STENT PLACEMENT Right 03/01/2018   Procedure: CYSTOSCPY/RIGHT URETEROSCOPY HOLMIUM LASER LITHOTRIPSY AND STENT EXCHANGE;  Surgeon: Ardis Hughs, MD;  Location: [facility];  Service: Urology;  Laterality: Right;   FOOT SURGERY     HIP ARTHROPLASTY Left 08/27/2020   Procedure: ARTHROPLASTY BIPOLAR HIP (HEMIARTHROPLASTY);  Surgeon: Mordecai Rasmussen, MD;  Location: [facility];  Service: Orthopedics;  Laterality: Left;   HOLMIUM LASER APPLICATION Right 08/67/6195   Procedure: HOLMIUM LASER APPLICATION;  Surgeon: Ardis Hughs, MD;  Location: [facility];  Service: Urology;   Laterality: Right;   JOINT REPLACEMENT     BTKR, RTHR   TOTAL KNEE ARTHROPLASTY  08/21/2007   Right total knee replacement   Family History  Problem Relation Age of Onset   Stroke Father    Coronary artery disease Son        CABG at age 64   Heart disease Son    Heart attack Son    Stroke Mother    Hypertension Sister    Diabetes Brother    Hypertension Maternal Aunt    Social History   Socioeconomic History   Marital status: Divorced    Spouse name: Not on file   Number of children: 33  Years of education: 8th grade   Highest education level: Not on file  Occupational History   Occupation: Retired  Tobacco Use   Smoking status: Never   Smokeless tobacco: Never  Vaping Use   Vaping Use: Never used  Substance and Sexual Activity   Alcohol use: No   Drug use: No   Sexual activity: Not on file  Other Topics Concern   Not on file  Social History Narrative   Lives at home with 2 sons   She has four living children.   Right-handed.   Two cups caffeine per day.   Social Determinants of Health   Financial Resource Strain: Low Risk    Difficulty of Paying Living Expenses: Not hard at all  Food Insecurity: No Food Insecurity   Worried About Charity fundraiser in the Last Year: Never true   [place] in the Last Year: Never true  Transportation Needs: No Transportation Needs   Lack of Transportation (Medical): No   Lack of Transportation (Non-Medical): No  Physical Activity: Inactive   Days of Exercise per Week: 0 days   Minutes of Exercise per Session: 0 min  Stress: No Stress Concern Present   Feeling of Stress : Not at all  Social Connections: Socially Isolated   Frequency of Communication with Friends and Family: Once a week   Frequency of Social Gatherings with Friends and Family: More than three times a week   Attends Religious Services: Never   Marine scientist or Organizations: No   Attends Music therapist: Never   Marital  Status: Divorced    Tobacco Counseling Counseling given: Not Answered   Clinical Intake:  Pre-visit preparation completed: Yes  Pain : No/denies pain     Nutritional Risks: None Diabetes: No  How often do you need to have someone help you when you read instructions, pamphlets, or other written materials from your doctor or pharmacy?: 5 - Always  Diabetic?No   Interpreter Needed?: No  Information entered by :: [place] of Daily Living In your present state of health, do you have any difficulty performing the following activities: 10/23/2020 08/26/2020  Hearing? [place]? N -  Difficulty concentrating or making decisions? Y -  Walking or climbing stairs? Y -  Dressing or bathing? Y -  Doing errands, shopping? Tempie Donning  Preparing Food and eating ? Y -  Using the Toilet? N -  In the past six months, have you accidently leaked urine? Y -  Comment currently wears pull ups -  Do you have problems with loss of bowel control? Y -  Managing your Medications? Y -  Managing your Finances? Y -  Housekeeping or managing your Housekeeping? Y -  Some recent data might be hidden    Patient Care Team: [name], MD as PCP - General (Family Medicine) Stanford Breed Denice Bors, MD as PCP - Cardiology (Cardiology)  Indicate any recent Medical Services you may have received from other than [facility] providers in the past year (date may be approximate).     Assessment:   This is a routine wellness examination for Annina.  Hearing/Vision screen Vision Screening - Comments:: Patient gets eyes examined once per year. Currently wears glasses to read   Dietary issues and exercise activities discussed: Current Exercise Habits: The patient does not participate in regular exercise at present, Exercise limited by: orthopedic condition(s)   Goals Addressed  This Visit's Progress    Patient Stated       I would like to be able to walk alone unassisted to go to the  bathroom       Prevent falls         Depression Screen PHQ 2/9 Scores 10/23/2020 05/25/2020 07/24/2017 04/17/2017 01/05/2017 12/22/2016 10/01/2015  PHQ - 2 Score 0 0 1 1 0 0 1  PHQ- 9 Score - - 3 3 - 4 5    Fall Risk Fall Risk  10/23/2020 05/25/2020 03/22/2019 07/24/2017 04/17/2017  Falls in the past year? 1 1 0 Yes No  Comment - - Emmi Telephone Survey: data to providers prior to load - -  Number falls in past yr: 1 1 - 2 or more -  Injury with Fall? 1 1 - Yes -  Risk for fall due to : Impaired balance/gait;History of fall(s);Impaired mobility - - - -  Follow up Falls evaluation completed;Falls prevention discussed Falls evaluation completed - Falls evaluation completed -    FALL RISK PREVENTION PERTAINING TO THE HOME:  Any stairs in or around the home? No  If so, are there any without handrails? No  Home free of loose throw rugs in walkways, pet beds, electrical cords, etc? Yes  Adequate lighting in your home to reduce risk of falls? Yes   ASSISTIVE DEVICES UTILIZED TO PREVENT FALLS:  Life alert? No  Use of a cane, walker or w/c? Yes  Grab bars in the bathroom? Yes  Shower chair or bench in shower? Yes  Elevated toilet seat or a handicapped toilet? No    Cognitive Function: Cognitive status assessed by direct observation.Patient has current diagnosis of cognitive impairment. . Patient is unable to complete screening 6CIT or MMSE.   MMSE - Mini Mental State Exam 07/17/2019 01/24/2019  Orientation to time 4 4  Orientation to Place 4 4  Registration 3 3  Attention/ Calculation 0 3  Recall 0 1  Language- name 2 objects 2 2  Language- repeat 1 1  Language- follow 3 step command 3 3  Language- read & follow direction 1 1  Write a sentence 0 0  Copy design 0 0  Total score 18 22        Immunizations Immunization History  Administered Date(s) Administered   Fluad Quad(high Dose 65+) 04/07/2020   Influenza Whole 02/17/2005, 03/15/2007, 02/21/2008   Influenza,inj,Quad PF,6+  Mos 01/11/2013, 06/17/2016, 01/05/2017   Influenza-Unspecified 02/06/2012   Pneumococcal Conjugate-13 08/23/2014   Pneumococcal Polysaccharide-23 07/23/2013   Zoster, Live 08/23/2014    TDAP status: Due, Education has been provided regarding the importance of this vaccine. Advised may receive this vaccine at local pharmacy or Health Dept. Aware to provide a copy of the vaccination record if obtained from local pharmacy or Health Dept. Verbalized acceptance and understanding.  Flu Vaccine status: Up to date  Pneumococcal vaccine status: Up to date  Covid-19 vaccine status: Declined, Education has been provided regarding the importance of this vaccine but patient still declined. Advised may receive this vaccine at local pharmacy or Health Dept.or vaccine clinic. Aware to provide a copy of the vaccination record if obtained from local pharmacy or Health Dept. Verbalized acceptance and understanding.  Qualifies for Shingles Vaccine? Yes   Zostavax completed No   Shingrix Completed?: No.    Education has been provided regarding the importance of this vaccine. Patient has been advised to call insurance company to determine out of pocket expense if they have not yet received  this vaccine. Advised may also receive vaccine at local pharmacy or Health Dept. Verbalized acceptance and understanding.  Screening Tests Health Maintenance  Topic Date Due   COVID-19 Vaccine (1) Never done   FOOT EXAM  Never done   TETANUS/TDAP  Never done   Zoster Vaccines- Shingrix (1 of 2) Never done   URINE MICROALBUMIN  03/07/2008   OPHTHALMOLOGY EXAM  07/13/2011   HEMOGLOBIN A1C  10/06/2020   INFLUENZA VACCINE  11/30/2020   DEXA SCAN  Completed   PNA vac Low Risk Adult  Completed   HPV VACCINES  Aged Out    Health Maintenance  Health Maintenance Due  Topic Date Due   COVID-19 Vaccine (1) Never done   FOOT EXAM  Never done   TETANUS/TDAP  Never done   Zoster Vaccines- Shingrix (1 of 2) Never done    URINE MICROALBUMIN  03/07/2008   OPHTHALMOLOGY EXAM  07/13/2011   HEMOGLOBIN A1C  10/06/2020    Colorectal cancer screening: No longer required.   Mammogram status: No longer required due to age.  Bone Density status: Completed 08/15/2016. Results reflect: Bone density results: OSTEOPENIA. Repeat every 5 years.  Lung Cancer Screening: (Low Dose CT Chest recommended if Age 10-80 years, 30 pack-year currently smoking OR have quit w/in 15years.) does not qualify.   Lung Cancer Screening Referral: N/A   Additional Screening:  Hepatitis C Screening: does not qualify;   Vision Screening: Recommended annual ophthalmology exams for early detection of glaucoma and other disorders of the eye. Is the patient up to date with their annual eye exam?  No  Who is the provider or what is the name of the office in which the patient attends annual eye exams? Dr. Katy Fitch  If pt is not established with a provider, would they like to be referred to a provider to establish care? No .   Dental Screening: Recommended annual dental exams for proper oral hygiene  Community Resource Referral / Chronic Care Management: CRR required this visit?  No   CCM required this visit?  No      Plan:     I have personally reviewed and noted the following in the patient's chart:   Medical and social history Use of alcohol, tobacco or illicit drugs  Current medications and supplements including opioid prescriptions.  Functional ability and status Nutritional status Physical activity Advanced directives List of other physicians Hospitalizations, surgeries, and ER visits in previous 12 months Vitals Screenings to include cognitive, depression, and falls Referrals and appointments  In addition, I have reviewed and discussed with patient certain preventive protocols, quality metrics, and best practice recommendations. A written personalized care plan for preventive services as well as general preventive health  recommendations were provided to patient.     Ofilia Neas, LPN   12/10/5724   Nurse Notes: None

## 2020-10-23 NOTE — Telephone Encounter (Signed)
Patient's son came in with patient for medicare wellness visit and asked if he could get a letter stating that patient has memory issues so that he could get medical power of attorney, and power attorney over patient through the court. Please advise?

## 2020-10-25 NOTE — Progress Notes (Signed)
She presents today chief complaint of painful elongated nails bilaterally.  States that she was recently in the hospital for hip replacement and then went on to physical therapy.  States that she has some swelling in her left foot and some peeling of the skin on the left foot.  Objective: Vital signs stable alert and oriented x3.  Left lower extremity does demonstrate moderate edema with some peeling of the skin around the foot most likely secondary to edema from her surgery.  Pulses are palpable.  Toenails are long thick yellow dystrophic onychomycotic.  Assessment: Diabetic peripheral neuropathy swelling of the left leg secondary to his previous surgery.  Pain in limb secondary to onychomycosis.  Plan: Debridement of nails bilateral.

## 2020-11-03 ENCOUNTER — Encounter: Payer: Self-pay | Admitting: Family Medicine

## 2020-11-03 NOTE — Telephone Encounter (Signed)
Call placed to patient son, Audelia Acton. [place].

## 2020-11-05 ENCOUNTER — Telehealth: Payer: Self-pay | Admitting: *Deleted

## 2020-11-05 NOTE — Chronic Care Management (AMB) (Signed)
  Chronic Care Management   Outreach Note  11/05/2020 Name: [name] MRN: [id number] DOB: November 09, 1933  [name] is a 87 y.o. year old female who is a primary care patient of Dennard Schaumann, [name], MD. I reached out to [name] by phone today in response to a referral sent by [name]'s PCP, Dr. Dennard Schaumann.       An unsuccessful telephone outreach was attempted today. The patient was referred to the case management team for assistance with care management and care coordination.   Follow Up Plan: A HIPAA compliant phone message was left for the patient providing contact information and requesting a return call. The care management team will reach out to the patient again over the next 7 days.  If patient returns call to provider office, please advise to call [place] at [phone].  [place] Management

## 2020-11-10 ENCOUNTER — Ambulatory Visit: Payer: 59 | Admitting: Orthopedic Surgery

## 2020-11-12 ENCOUNTER — Other Ambulatory Visit: Payer: Self-pay | Admitting: Podiatry

## 2020-11-12 NOTE — Chronic Care Management (AMB) (Signed)
  Chronic Care Management   Outreach Note  11/12/2020 Name: [name] MRN: [id number] DOB: 02/19/1934  [name] is a 87 y.o. year old female who is a primary care patient of Dennard Schaumann, [name], MD. I reached out to [name] by phone today in response to a referral sent by Ms. [name]'s PCP, Dr. Dennard Schaumann      A second unsuccessful telephone outreach was attempted today. The patient was referred to the case management team for assistance with care management and care coordination.   Follow Up Plan: A HIPAA compliant phone message was left for the patient providing contact information and requesting a return call. The care management team will reach out to the patient again over the next 7 days.  If patient returns call to provider office, please advise to call [place] at [phone].  [place] Management

## 2020-11-12 NOTE — Telephone Encounter (Signed)
Please advise 

## 2020-11-12 NOTE — Chronic Care Management (AMB) (Signed)
Chronic Care Management   Note  11/12/2020 Name: [name] MRN: [id number] DOB: December 10, 1933  [name] is a 87 y.o. year old female who is a primary care patient of Tanya Nones, Priscille Heidelberg, MD. I reached out to Genia Plants by phone today in response to a referral sent by Ms. Scarlette Slice Fabro's PCP, Dr. Tanya Nones      Ms. Delk was given information about Chronic Care Management services today including:  CCM service includes personalized support from designated clinical staff supervised by her physician, including individualized plan of care and coordination with other care providers 24/7 contact phone numbers for assistance for urgent and routine care needs. Service will only be billed when office clinical staff spend 20 minutes or more in a month to coordinate care. Only one practitioner may furnish and bill the service in a calendar month. The patient may stop CCM services at any time (effective at the end of the month) by phone call to the office staff. The patient will be responsible for cost sharing (co-pay) of up to 20% of the service fee (after annual deductible is met).  Confimred by son Jeananne Rama DPR on file verbally agreed to assistance and services provided by embedded care coordination/care management team today.  Follow up plan: Telephone appointment with care management team member scheduled for:12/07/2020  [facility] Guide, Embedded Care Coordination [facility] Management

## 2020-11-18 NOTE — Progress Notes (Deleted)
HPI: FU atrial fibrillation and abnormal functional study. I intitially saw in January of 2013 for an abnormal electrocardiogram. She had documented dynamic anterior T-wave changes. Myoview was performed in January 2013 and showed mild reversible defect at the apex. This was felt to possibly represent ischemia. Ejection fraction was 77%. I reviewed and felt low risk. Treated medically. Patient was seen with atrial fibrillation March 2019.  She has fallen several times and therefore anticoagulation discontinued. She has had multiple previous admissions after falls and with encephalopathy; also with renal calculi. Echocardiogram March 2021 showed normal LV function, severe left ventricular hypertrophy and mildly dilated aortic root; consider amyloid. Carotid Dopplers March 2021 showed 1 to 39% bilateral stenosis.  Abd ultrasound 12/21 showed [name] 4.1 cm. Since I last saw her,    Current Outpatient Medications  Medication Sig Dispense Refill   acetaminophen (TYLENOL) 325 MG tablet Take 2 tablets (650 mg total) by mouth every 6 (six) hours.     albuterol (VENTOLIN HFA) 108 (90 Base) MCG/ACT inhaler Inhale into the lungs.     ALPRAZolam (XANAX) 0.25 MG tablet Take 1 tablet (0.25 mg total) by mouth 2 (two) times daily as needed for anxiety. 14 tablet 0   ammonium lactate (AMLACTIN) 12 % cream Apply topically as needed for dry skin. 385 g 0   ammonium lactate (LAC-HYDRIN) 12 % lotion Apply topically.     aspirin EC 81 MG tablet Take 1 po BID x 6 weeks then resume 1 po daily 30 tablet 11   atenolol (TENORMIN) 50 MG tablet TAKE 1 TABLET BY MOUTH DAILY 30 tablet 10   bisacodyl (DULCOLAX) 10 MG suppository Place 1 suppository (10 mg total) rectally daily as needed for moderate constipation. 12 suppository 0   Calcium Carbonate-Vitamin D3 600-400 MG-UNIT TABS Take 1 tablet by mouth daily.     diclofenac sodium (VOLTAREN) 1 % GEL Apply 2 g topically 4 (four) times daily. Rub into affected area of foot 2 to  4 times daily 100 g 2   DULoxetine (CYMBALTA) 30 MG capsule TAKE 1 CAPSULE BY MOUTH DAILY ALONG WITH 60 MG FOR TOTAL DAILY DOSE OF 90 MG 20 capsule 10   DULoxetine (CYMBALTA) 60 MG capsule TAKE 1 CAPSULE BY MOUTH DAILY ALONG WITH 30 MG FOR TOTAL DAILY DOSE OF 90 MG 20 capsule 10   fluticasone (FLONASE) 50 MCG/ACT nasal spray Place 1 spray into both nostrils daily.     furosemide (LASIX) 40 MG tablet Take 40 mg by mouth daily.     gabapentin (NEURONTIN) 300 MG capsule Take 1 capsule by mouth 3 (three) times daily.     gabapentin (NEURONTIN) 800 MG tablet Take 1 tablet (800 mg total) by mouth 3 (three) times daily. 270 tablet 0   ketoconazole (NIZORAL) 2 % cream APPLY TOPICALLY DAILY *FOLLOW UP WITH ADAM MCDONALD [phone]* 60 g 10   MUCINEX 600 MG 12 hr tablet Take 600 mg by mouth 2 (two) times daily. (Patient not taking: Reported on 10/23/2020)     mupirocin ointment (BACTROBAN) 2 % APPLY TOPICALLY TO WOUND TWICE DAILY *FOLLOW UP WITH ADAM MCDONALD [phone]* 30 g 10   oxyCODONE (OXY IR/ROXICODONE) 5 MG immediate release tablet Take by mouth.     pantoprazole (PROTONIX) 40 MG tablet TAKE 1 TABLET BY MOUTH ONCE DAILY 30 tablet 3   rosuvastatin (CRESTOR) 40 MG tablet TAKE 1 TABLET BY MOUTH EACH EVENING 30 tablet 10   senna-docusate (SENOKOT-S) 8.6-50 MG tablet Take 1 tablet  by mouth 2 (two) times daily. (Patient not taking: Reported on 10/23/2020)     No current facility-administered medications for this visit.     Past Medical History:  Diagnosis Date   [name] (abdominal aortic aneurysm) ([place]) 09/2013   3.6 cm   Acute and chronic respiratory failure with hypoxia (HCC)    Acute delirium    Acute encephalopathy    Acute pyelonephritis    Atrial fibrillation (HCC)    Chronic pain syndrome    Constipation    Dementia (HCC)    Dependent edema    bilateral legs    Depression    Diabetes mellitus    pre-diabetes   Diverticulosis    Diverticulosis    Dysuria    E coli bacteremia     Fall    Gross hematuria    Hydronephrosis with renal and ureteral calculous obstruction    Hyperlipidemia    Hypertension    Hypoxemia    Idiopathic peripheral autonomic neuropathy    Infestation by bed bug    Nephrolithiasis    OSA on CPAP    Osteoarthritis    Osteopenia    T=  -2.1 in hip   Persistent mood (affective) disorder, unspecified (HCC)    Prediabetes    Rheumatoid arthritis (HCC)    Rheumatoid arthritis(714.0)    S/P carpal tunnel release    Sepsis secondary to UTI [facility])    Spinal stenosis    Unspecified fracture of fifth lumbar vertebra, initial encounter for closed fracture ([place])    Unspecified fracture of t11-T12 vertebra, initial encounter for closed fracture ([place])    Urinary incontinence    Urinary tract infection    hx of    Urolithiasis     Past Surgical History:  Procedure Laterality Date   APPENDECTOMY     CARPAL TUNNEL RELEASE  12/27/2006   Right subcutaneous ulnar nerve transfer -- Right open carpal tunnel release   CHOLECYSTECTOMY     CYSTOSCOPY W/ URETERAL STENT PLACEMENT Right 01/30/2018   Procedure: CYSTOSCOPY WITH STENT REPLACEMENT retrograde pylegram;  Surgeon: Ardis Hughs, MD;  Location: [facility];  Service: Urology;  Laterality: Right;   CYSTOSCOPY WITH URETEROSCOPY AND STENT PLACEMENT Right 03/01/2018   Procedure: CYSTOSCPY/RIGHT URETEROSCOPY HOLMIUM LASER LITHOTRIPSY AND STENT EXCHANGE;  Surgeon: Ardis Hughs, MD;  Location: [facility];  Service: Urology;  Laterality: Right;   FOOT SURGERY     HIP ARTHROPLASTY Left 08/27/2020   Procedure: ARTHROPLASTY BIPOLAR HIP (HEMIARTHROPLASTY);  Surgeon: Mordecai Rasmussen, MD;  Location: [facility];  Service: Orthopedics;  Laterality: Left;   HOLMIUM LASER APPLICATION Right 62/22/9798   Procedure: HOLMIUM LASER APPLICATION;  Surgeon: Ardis Hughs, MD;  Location: [facility];  Service: Urology;  Laterality: Right;   JOINT REPLACEMENT     BTKR, RTHR   TOTAL KNEE ARTHROPLASTY  08/21/2007   Right total  knee replacement    Social History   Socioeconomic History   Marital status: Divorced    Spouse name: Not on file   Number of children: 6   Years of education: 8th grade   Highest education level: Not on file  Occupational History   Occupation: Retired  Tobacco Use   Smoking status: Never   Smokeless tobacco: Never  Vaping Use   Vaping Use: Never used  Substance and Sexual Activity   Alcohol use: No   Drug use: No   Sexual activity: Not on file  Other Topics Concern   Not on file  Social  History Narrative   Lives at home with 2 sons   She has four living children.   Right-handed.   Two cups caffeine per day.   Social Determinants of Health   Financial Resource Strain: Low Risk    Difficulty of Paying Living Expenses: Not hard at all  Food Insecurity: No Food Insecurity   Worried About Charity fundraiser in the Last Year: Never true   [place] in the Last Year: Never true  Transportation Needs: No Transportation Needs   Lack of Transportation (Medical): No   Lack of Transportation (Non-Medical): No  Physical Activity: Inactive   Days of Exercise per Week: 0 days   Minutes of Exercise per Session: 0 min  Stress: No Stress Concern Present   Feeling of Stress : Not at all  Social Connections: Socially Isolated   Frequency of Communication with Friends and Family: Once a week   Frequency of Social Gatherings with Friends and Family: More than three times a week   Attends Religious Services: Never   Marine scientist or Organizations: No   Attends Music therapist: Never   Marital Status: Divorced  Human resources officer Violence: Not At Risk   Fear of Current or Ex-Partner: No   Emotionally Abused: No   Physically Abused: No   Sexually Abused: No    Family History  Problem Relation Age of Onset   Stroke Father    Coronary artery disease Son        CABG at age 48   Heart disease Son    Heart attack Son    Stroke Mother    Hypertension  Sister    Diabetes Brother    Hypertension Maternal Aunt     ROS: no fevers or chills, productive cough, hemoptysis, dysphasia, odynophagia, melena, hematochezia, dysuria, hematuria, rash, seizure activity, orthopnea, PND, pedal edema, claudication. Remaining systems are negative.  Physical Exam: Well-developed well-nourished in no acute distress.  Skin is warm and dry.  HEENT is normal.  Neck is supple.  Chest is clear to auscultation with normal expansion.  Cardiovascular exam is regular rate and rhythm.  Abdominal exam nontender or distended. No masses palpated. Extremities show no edema. neuro grossly intact  ECG- personally reviewed  A/P  1 permanent atrial fibrillation-continue atenolol for rate control; no anticoagulation given h/o recurrent falls.   2 prior abnormal nuclear study-previous abnormality felt possibly secondary to shifting breast attenuation. No CP. Continue ASA and statin.  3 abdominal aortic aneurysm-[facility] ultrasound 12/22.   4 hypertension-blood pressure controlled.  Continue present medical regimen.   5 hyperlipidemia-continue statin.   6 chronic diastolic/right CHF-euvolemic; she will continue lasix as needed.  Kirk Ruths, MD

## 2020-11-20 ENCOUNTER — Ambulatory Visit: Payer: Medicare Other | Admitting: Cardiology

## 2020-11-24 ENCOUNTER — Ambulatory Visit: Payer: 59 | Admitting: Orthopedic Surgery

## 2020-12-04 ENCOUNTER — Other Ambulatory Visit: Payer: Self-pay

## 2020-12-04 ENCOUNTER — Encounter: Payer: Self-pay | Admitting: Orthopedic Surgery

## 2020-12-04 ENCOUNTER — Ambulatory Visit: Payer: 59

## 2020-12-04 ENCOUNTER — Ambulatory Visit (INDEPENDENT_AMBULATORY_CARE_PROVIDER_SITE_OTHER): Payer: 59 | Admitting: Orthopedic Surgery

## 2020-12-04 DIAGNOSIS — Z96642 Presence of left artificial hip joint: Secondary | ICD-10-CM

## 2020-12-04 DIAGNOSIS — S72002D Fracture of unspecified part of neck of left femur, subsequent encounter for closed fracture with routine healing: Secondary | ICD-10-CM

## 2020-12-04 NOTE — Progress Notes (Signed)
Orthopaedic Postop Note  Assessment: [name] is a 87 y.o. female s/p Left hip hemiarthroplasty  DOS: 08/27/2020  Plan: Continues to improve.  No issues with her hip.  She has some pain in her left knee and a lesion over her left lateral ankle that is being followed by her podiatrist.  She is pleased with her improvements following surgery.  If she continues to have pain in the left knee, she can return for a full workup and consideration for a steroid injection.  Otherwise, I will see her around the 1 year anniversary of the surgery.     Follow-up: Return in about 9 months (around 09/03/2021). XR at next visit: AP pelvis and Left hip  Subjective:  Chief Complaint  Patient presents with   s/p hip hemiarthroplasty    Left hip/follow up/DOS 08/27/20    History of Present Illness: [name] is a 87 y.o. female who presents following the above stated procedure. She is approximately 3 months out from surgery, and doing well.  She has been discharged form home health PT.  Continues to have left knee pain, which is restricting her ability to ambulate.  No issues with her surgical incision.  She has a lesion or ulcer on her left lateral ankle that is being treated by her podiatrist.   Review of Systems: No fevers or chills No numbness or tingling No Chest Pain No shortness of breath   Objective: There were no vitals taken for this visit.  Physical Exam:  Alert and oriented.  No acute distress.   Seated in a wheelchair  Surgical incision is healing well.  No erythema or drainage.  She has no pain with axial loading.  Tolerates gentle range of motion.  Sensation intact over the dorsum of her foot.  Active motion to TA/EHL.  Small area of redness over the lateral ankle.  This area is tender to palpation. No drainage.  Tenderness along the medial and lateral joint line.  Mild effusion.   IMAGING: I personally ordered and reviewed the following images:  XR of the left hip  demonstrates a cemented left hip hemiarthroplasty.  Hip is reduced.  No acute injury.  No evidence of subsidence of the prosthesis.   Impression: Left hip hemiarthroplasty in good position.   Mordecai Rasmussen, MD 12/04/2020 11:03 PM

## 2020-12-07 ENCOUNTER — Ambulatory Visit (INDEPENDENT_AMBULATORY_CARE_PROVIDER_SITE_OTHER): Payer: 59 | Admitting: *Deleted

## 2020-12-07 DIAGNOSIS — I482 Chronic atrial fibrillation, unspecified: Secondary | ICD-10-CM | POA: Diagnosis not present

## 2020-12-07 DIAGNOSIS — I10 Essential (primary) hypertension: Secondary | ICD-10-CM

## 2020-12-07 NOTE — Patient Instructions (Addendum)
Visit Information   PATIENT GOALS:   Goals Addressed             This Visit's Progress    Track and Manage Heart Rate and Rhythm-Atrial Fibrillation       Timeframe:  Long-Range Goal Priority:  Medium Start Date:       12/07/2020                      Expected End Date:       06/09/2021                Follow Up Date -  01/22/2021   - check pulse (heart) rate once a day - make a plan to exercise regularly- continue chair exercises prescribed by physical therapist - make a plan to eat healthy - take medicine as prescribed  - look over educational material mailed to you- advanced directives and falls - please complete advanced directives- call RN care manager if any questions at [phone]   Why is this important?   Atrial fibrillation may have no symptoms. Sometimes the symptoms get worse or happen more often.  It is important to keep track of what your symptoms are and when they happen.  A change in symptoms is important to discuss with your doctor or nurse.  Being active and healthy eating will also help you manage your heart condition.     Notes:      Track and Manage My Blood Pressure-Hypertension       Timeframe:  Long-Range Goal Priority:  Medium Start Date:           12/07/2020                  Expected End Date:      06/09/2021                 Follow Up Date- 01/22/2021   - check blood pressure 3 times per week - choose a place to take my blood pressure (home, clinic or office, retail store) - write blood pressure results in a log or diary  - please look at [employer] for blood pressure cuff - follow low salt diet- avoid salty snacks, fast food. - look over educational material mailed to you- low sodium diet - continue to do your chair exercises - please be careful when walking, keep pathways clear   Why is this important?   You won't feel high blood pressure, but it can still hurt your blood vessels.  High blood pressure can cause heart or kidney  problems. It can also cause a stroke.  Making lifestyle changes like losing a little weight or eating less salt will help.  Checking your blood pressure at home and at different times of the day can help to control blood pressure.  If the doctor prescribes medicine remember to take it the way the doctor ordered.  Call the office if you cannot afford the medicine or if there are questions about it.     Notes:         Consent to CCM Services: [name] was given information about Chronic Care Management services including:  CCM service includes personalized support from designated clinical staff supervised by her physician, including individualized plan of care and coordination with other care providers 24/7 contact phone numbers for assistance for urgent and routine care needs. Service will only be billed when office clinical staff spend 20 minutes or more in a month  to coordinate care. Only one practitioner may furnish and bill the service in a calendar month. The patient may stop CCM services at any time (effective at the end of the month) by phone call to the office staff. The patient will be responsible for cost sharing (co-pay) of up to 20% of the service fee (after annual deductible is met).  Patient agreed to services and verbal consent obtained.   The patient verbalized understanding of instructions, educational materials, and care plan provided today and agreed to receive a mailed copy of patient instructions, educational materials, and care plan.   Telephone follow up appointment with care management team member scheduled for:  9/23/2022Preventing Falls and Fractures  Falls can be very serious, especially for older adults or people with osteoporosis  Falls can be caused by: Tripping or slipping Slow reflexes Balance problems Reduced muscle strength Poor vision or a recent change in prescription Illness and some medications (especially blood pressure pills, diuretics, heart  medicines, muscle relaxants and sleep medications) Drinking alcohol  To prevent falls outdoors: Use a can or walker if needed Wear rubber-soled shoes so you don't slip DO NOT buy "shape up" shoes with rocker bottom soles if you have balance problems.  The thick soles and shape make it more difficult to keep your balance. Put kitty litter or salt on icy sidewalks Walk on the grass if the sidewalks are slick Avoid walking on uneven ground whenever possible  T prevent falls indoors: Keep rooms clutter-free, especially hallways, stairs and paths to light switches Remove throw rugs Install night lights, especially to and in the bathroom Turn on lights before going downstairs Keep a flashlight next to your bed Buy a cordless phone to keep with you instead of jumping up to answer the phone Install grab bars in the bathroom near the shower and toilet Install rails on both sides of the stairs.  Make sure the stairs are well lit Wear slippers with non-skid soles.  Do not walk around in stockings or socks  Balance problems and dizziness are not a normal part of growing older.  If you begin having balance problems or dizziness see your doctor.  Physical Therapy can help you with many balance problems, strengthening hip and leg muscles and with gait training.  To keep your bones healthy make sure you are getting enough calcium and Vitamin D each day.  Ask your doctor or pharmacist about supplements.  Regular weight-bearing exercise like walking, lifting weights or dancing can help strengthen bones and prevent osteoporosis. Critical care medicine: Principles of diagnosis and management in the adult (4th ed., pp. 9476-5465). Saunders."> Miller's anesthesia (8th ed., pp. 232-250). Saunders.">  Advance Directive  Advance directives are legal documents that allow you to make decisions about your health care and medical treatment in case you become unable to communicate for yourself. Advance directives let  your wishes be known to family, friends,and health care providers. Discussing and writing advance directives should happen over time rather than all at once. Advance directives can be changed and updated at any time. There are different types of advance directives, such as: Medical power of attorney. Living will. Do not resuscitate (DNR) order or do not attempt resuscitation (DNAR) order. Health care proxy and medical power of attorney A health care proxy is also called a health care agent. This person is appointed to make medical decisions for you when you are unable to make decisions for yourself. Generally, people ask a trusted friend or family member to act  as their proxy and represent their preferences. Make sure you have an agreement with your trusted person to act as your proxy. A proxy may have tomake a medical decision on your behalf if your wishes are not known. A medical power of attorney, also called a durable power of attorney for health care, is a legal document that names your health care proxy. Depending on the laws in your state, the document may need to be: Signed. Notarized. Dated. Copied. Witnessed. Incorporated into your medical record. You may also want to appoint a trusted person to manage your money in the event you are unable to do so. This is called a durable power of attorney for finances. It is a separate legal document from the durable power of attorney for health care. You may choose your health care proxy or someone different toact as your agent in money matters. If you do not appoint a proxy, or there is a concern that the proxy is not acting in your best interest, a court may appoint a guardian to act on yourbehalf. Living will A living will is a set of instructions that state your wishes about medical care when you cannot express them yourself. Health care providers should keep a copy of your living will in your medical record. You may want to give a copy to family  members or friends. To alert caregivers in case of an emergency, you can place a card in your wallet to let them know that you have a living will and where they can find it. A living will is used if you become: Terminally ill. Disabled. Unable to communicate or make decisions. The following decisions should be included in your living will: To use or not to use life support equipment, such as dialysis machines and breathing machines (ventilators). Whether you want a DNR or DNAR order. This tells health care providers not to use cardiopulmonary resuscitation (CPR) if breathing or heartbeat stops. To use or not to use tube feeding. To be given or not to be given food and fluids. Whether you want comfort (palliative) care when the goal becomes comfort rather than a cure. Whether you want to donate your organs and tissues. A living will does not give instructions for distributing your money andproperty if you should pass away. DNR or DNAR A DNR or DNAR order is a request not to have CPR in the event that your heart stops beating or you stop breathing. If a DNR or DNAR order has not been made and shared, a health care provider will try to help any patient whose heart has stopped or who has stopped breathing. If you plan to have surgery, talk with your health care provider about how your DNR or DNAR order will be followed ifproblems occur. What if I do not have an advance directive? Some states assign family decision makers to act on your behalf if you do not have an advance directive. Each state has its own laws about advance directives. You may want to check with your health care provider, attorney, orstate representative about the laws in your state. Summary Advance directives are legal documents that allow you to make decisions about your health care and medical treatment in case you become unable to communicate for yourself. The process of discussing and writing advance directives should happen over  time. You can change and update advance directives at any time. Advance directives may include a medical power of attorney, a living will, and a DNR or DNAR  order. This information is not intended to replace advice given to you by your health care provider. Make sure you discuss any questions you have with your healthcare provider. Document Revised: 01/21/2020 Document Reviewed: 01/21/2020 Elsevier Patient Education  [place]. Low-Sodium Eating Plan Sodium, which is an element that makes up salt, helps you maintain a healthy balance of fluids in your body. Too much sodium can increase your bloodpressure and cause fluid and waste to be held in your body. Your health care provider or dietitian may recommend following this plan if you have high blood pressure (hypertension), kidney disease, liver disease, or heart failure. Eating less sodium can help lower your blood pressure, reduce swelling, and protect your heart, liver, andkidneys. What are tips for following this plan? Reading food labels The Nutrition Facts label lists the amount of sodium in one serving of the food. If you eat more than one serving, you must multiply the listed amount of sodium by the number of servings. Choose foods with less than 140 mg of sodium per serving. Avoid foods with 300 mg of sodium or more per serving. Shopping  Look for lower-sodium products, often labeled as "low-sodium" or "no salt added." Always check the sodium content, even if foods are labeled as "unsalted" or "no salt added." Buy fresh foods. Avoid canned foods and pre-made or frozen meals. Avoid canned, cured, or processed meats. Buy breads that have less than 80 mg of sodium per slice.  Cooking  Eat more home-cooked food and less restaurant, buffet, and fast food. Avoid adding salt when cooking. Use salt-free seasonings or herbs instead of table salt or sea salt. Check with your health care provider or pharmacist before using salt  substitutes. Cook with plant-based oils, such as canola, sunflower, or olive oil.  Meal planning When eating at a restaurant, ask that your food be prepared with less salt or no salt, if possible. Avoid dishes labeled as brined, pickled, cured, smoked, or made with soy sauce, miso, or teriyaki sauce. Avoid foods that contain MSG (monosodium glutamate). MSG is sometimes added to [place] food, bouillon, and some canned foods. Make meals that can be grilled, baked, poached, roasted, or steamed. These are generally made with less sodium. General information Most people on this plan should limit their sodium intake to 1,500-2,000 mg (milligrams) of sodium each day. What foods should I eat? Fruits Fresh, frozen, or canned fruit. Fruit juice. Vegetables Fresh or frozen vegetables. "No salt added" canned vegetables. "No salt added"tomato sauce and paste. Low-sodium or reduced-sodium tomato and vegetable juice. Grains Low-sodium cereals, including oats, puffed wheat and rice, and shredded wheat. Low-sodium crackers. Unsalted rice. Unsalted pasta. Low-sodium bread.Whole-grain breads and whole-grain pasta. Meats and other proteins Fresh or frozen (no salt added) meat, poultry, seafood, and fish. Low-sodium canned tuna and salmon. Unsalted nuts. Dried peas, beans, and lentils withoutadded salt. Unsalted canned beans. Eggs. Unsalted nut butters. Dairy Milk. Soy milk. Cheese that is naturally low in sodium, such as ricotta cheese, fresh mozzarella, or Swiss cheese. Low-sodium or reduced-sodium cheese. Creamcheese. Yogurt. Seasonings and condiments Fresh and dried herbs and spices. Salt-free seasonings. Low-sodium mustard and ketchup. Sodium-free salad dressing. Sodium-free light mayonnaise. Fresh orrefrigerated horseradish. Lemon juice. Vinegar. Other foods Homemade, reduced-sodium, or low-sodium soups. Unsalted popcorn and pretzels.Low-salt or salt-free chips. The items listed above may not be a complete  list of foods and beverages you can eat. Contact a dietitian for more information. What foods should I avoid? Vegetables Sauerkraut, pickled vegetables, and relishes.  Olives. [place] fries. Onion rings. Regular canned vegetables (not low-sodium or reduced-sodium). Regular canned tomato sauce and paste (not low-sodium or reduced-sodium). Regular tomato and vegetable juice (not low-sodium or reduced-sodium). Frozenvegetables in sauces. Grains Instant hot cereals. Bread stuffing, pancake, and biscuit mixes. Croutons. Seasoned rice or pasta mixes. Noodle soup cups. Boxed or frozen macaroni andcheese. Regular salted crackers. Self-rising flour. Meats and other proteins Meat or fish that is salted, canned, smoked, spiced, or pickled. Precooked or cured meat, such as sausages or meat loaves. Berniece Salines. Ham. Pepperoni. Hot dogs. Corned beef. Chipped beef. Salt pork. Jerky. Pickled herring. Anchovies andsardines. Regular canned tuna. Salted nuts. Dairy Processed cheese and cheese spreads. Hard cheeses. Cheese curds. Blue cheese.Feta cheese. String cheese. Regular cottage cheese. Buttermilk. Canned milk. Fats and oils Salted butter. Regular margarine. Ghee. Bacon fat. Seasonings and condiments Onion salt, garlic salt, seasoned salt, table salt, and sea salt. Canned and packaged gravies. Worcestershire sauce. Tartar sauce. Barbecue sauce. Teriyaki sauce. Soy sauce, including reduced-sodium. Steak sauce. Fish sauce. Oyster sauce. Cocktail sauce. Horseradish that you find on the shelf. Regular ketchup and mustard. Meat flavorings and tenderizers. Bouillon cubes. Hot sauce. Pre-made or packaged marinades. Pre-made or packaged taco seasonings. Relishes.Regular salad dressings. Salsa. Other foods Salted popcorn and pretzels. Corn chips and puffs. Potato and tortilla chips.Canned or dried soups. Pizza. Frozen entrees and pot pies. The items listed above may not be a complete list of foods and beverages you should avoid.  Contact a dietitian for more information. Summary Eating less sodium can help lower your blood pressure, reduce swelling, and protect your heart, liver, and kidneys. Most people on this plan should limit their sodium intake to 1,500-2,000 mg (milligrams) of sodium each day. Canned, boxed, and frozen foods are high in sodium. Restaurant foods, fast foods, and pizza are also very high in sodium. You also get sodium by adding salt to food. Try to cook at home, eat more fresh fruits and vegetables, and eat less fast food and canned, processed, or prepared foods. This information is not intended to replace advice given to you by your health care provider. Make sure you discuss any questions you have with your healthcare provider. Document Revised: 05/24/2019 Document Reviewed: 03/20/2019 Elsevier Patient Education  2022 [place] [facility], BSN RN Case Manager [facility] [phone]   CLINICAL CARE PLAN: Patient Care Plan: Atrial Fibrillation (Adult)     Problem Identified: Dysrhythmia (Atrial Fibrillation)   Priority: Medium     Long-Range Goal: Heart Rate and Rhythm Monitored and Managed   Start Date: 12/07/2020  Expected End Date: 06/09/2021  This Visit's Progress: On track  Priority: Medium  Note:   Current Barriers:  Knowledge deficits related to self health management of chronic afib Knowledge Deficits related to atrial fibrillation action plan No Advanced Directives in place Unable to self administer medications as prescribed- son provides oversight for medications Unable to perform ADLs independently- pt lives with son who is primary caregiver and there are 2 other sons in the home, pt is able to walk with assistance of walker, has wheelchair and ramp, had hip fracture in April and is still not currently at her previous level of independence but is working towards this, pt had home health PT and tries to complete the chair exercises prescribed.  Pt  has had left lateral ankle lesion which is sore but no broken skin, patient's son is going to make appointment with podiatrist to have checked as pt does have  regular follow up with podiatrist. Patient has PCS services M-F for 3 hours daily to assist with bathing and other ADL's. Unable to perform IADLs independently- son/ family assists Nurse Case Manager Clinical Goal(s):  patient will take all medications exactly as prescribed and will call provider for medication related questions patient will verbalize understanding of Afib Action Plan and when to call doctor patient will take all medications exactly as prescribed and will call provider for medication related questions patient will attend all scheduled medical appointments:  the patient will work with the care management team towards completion of advanced directives Interventions:  Collaboration with [name], MD regarding development and update of comprehensive plan of care as evidenced by provider attestation and co-signature Inter-disciplinary care team collaboration (see longitudinal plan of care) Basic overview and discussion of afib disease state Afib action plan reviewed Provided education to patient and/or caregiver about advanced directives   Mailed advanced directives packet to patient's home Reviewed medications with patient's son and discussed importance of taking as prescribed. Provided patient with educational materials related to falls and advanced directives. Reviewed scheduled/upcoming provider appointments including: Dr. Stanford Breed 9/22, Max Towner (podiatrist) 9/29 Discussed plans with patient for ongoing care management follow up and provided patient with direct contact information for care management team Reviewed safety precautions, importance of using walker, DME as needed Sent in basket to Dr. Dennard Schaumann reporting pt has not worn oxygen in 2-3 weeks and O2 saturation is >90 as son checks it daily, per son request,  does pt still need oxygen or use as PRN basis, son also wants to know primary care provider recommendation for audiologist to have hearing checked and pursue obtaining hearing aides. Self-Care Activities: Attends all scheduled provider appointments Calls provider office for new concerns or questions Patient Goals: - check pulse (heart) rate once a day - make a plan to exercise regularly- continue chair exercises prescribed by physical therapist - make a plan to eat healthy - take medicine as prescribed  - look over educational material mailed to you- advanced directives and falls - please complete advanced directives- call RN care manager if any questions at [phone] Follow Up Plan: Telephone follow up appointment with care management team member scheduled for:   01/22/2021    Patient Care Plan: Hypertension (Adult)     Problem Identified: Hypertension (Hypertension)   Priority: Medium     Long-Range Goal: Hypertension Monitored   Start Date: 12/07/2020  Expected End Date: 06/09/2021  This Visit's Progress: On track  Priority: Medium  Note:   Objective:  Last practice recorded BP readings:  BP Readings from Last 3 Encounters:  10/23/20 122/78  10/07/20 110/82  09/11/20 104/69   Most recent eGFR/CrCl: No results found for: EGFR  No components found for: CRCL Current Barriers:  Knowledge Deficits related to basic understanding of hypertension pathophysiology and self care management- pt does not have blood pressure cuff, son states he will look in [employer] benefit catalog to obtain a cuff and start checking blood pressure. Unable to self administer medications as prescribed- son provides oversight Unable to perform ADLs independently- PCS aide and sons assist pt as needed (bathing, cooking, medication oversight, etc), pt had fall back in April with hip fracture and is still recovering. Unable to perform IADLs independently- sons assist Case Manager Clinical Goal(s):   patient will verbalize understanding of plan for hypertension management patient will attend all scheduled medical appointments patient will demonstrate improved adherence to prescribed treatment plan for hypertension as evidenced  by taking all medications as prescribed, monitoring and recording blood pressure as directed, adhering to low sodium/DASH diet Interventions:  Collaboration with [name], MD regarding development and update of comprehensive plan of care as evidenced by provider attestation and co-signature Inter-disciplinary care team collaboration (see longitudinal plan of care) Evaluation of current treatment plan related to hypertension self management and patient's adherence to plan as established by provider. Provided education to patient re: stroke prevention, s/s of heart attack and stroke, DASH diet, complications of uncontrolled blood pressure Reviewed medications with patient and discussed importance of compliance Discussed plans with patient for ongoing care management follow up and provided patient with direct contact information for care management team Advised patient, providing education and rationale, to monitor blood pressure 3 x per week and record, calling PCP for findings outside established parameters.  Reviewed scheduled/upcoming provider appointments including: Dr. Stanford Breed 9/22  Max Hyatt (podiatrist) 9/29 Encouraged son to look at benefit catalog from [employer] to obtain blood pressure cuff Reviewed fall precautions Self-Care Activities:  Attends all scheduled provider appointments Calls provider office for new concerns, questions, or BP outside discussed parameters Checks BP and records as discussed Follows a low sodium diet/DASH diet Patient Goals: - check blood pressure 3 times per week - choose a place to take my blood pressure (home, clinic or office, retail store) - write blood pressure results in a log or diary  - please look at  [employer] for blood pressure cuff - follow low salt diet- avoid salty snacks, fast food. - look over educational material mailed to you- low sodium diet - continue to do your chair exercises - please be careful when walking, keep pathways clear Follow Up Plan: Telephone follow up appointment with care management team member scheduled for:  01/22/2021

## 2020-12-07 NOTE — Chronic Care Management (AMB) (Signed)
Chronic Care Management   CCM RN Visit Note  12/07/2020 Name: [name] MRN: [id number] DOB: 1933-08-01  Subjective: [name] is a 87 y.o. year old female who is a primary care patient of Pickard, [name], MD. The care management team was consulted for assistance with disease management and care coordination needs.    Engaged with patient by telephone for initial visit in response to provider referral for case management and/or care coordination services.   Consent to Services:  The patient was given the following information about Chronic Care Management services today, agreed to services, and gave verbal consent: 1. CCM service includes personalized support from designated clinical staff supervised by the primary care provider, including individualized plan of care and coordination with other care providers 2. 24/7 contact phone numbers for assistance for urgent and routine care needs. 3. Service will only be billed when office clinical staff spend 20 minutes or more in a month to coordinate care. 4. Only one practitioner may furnish and bill the service in a calendar month. 5.The patient may stop CCM services at any time (effective at the end of the month) by phone call to the office staff. 6. The patient will be responsible for cost sharing (co-pay) of up to 20% of the service fee (after annual deductible is met). Patient agreed to services and consent obtained.  Patient agreed to services and verbal consent obtained.   Assessment: Review of patient past medical history, allergies, medications, health status, including review of consultants reports, laboratory and other test data, was performed as part of comprehensive evaluation and provision of chronic care management services.   SDOH (Social Determinants of Health) assessments and interventions performed:  SDOH Interventions    Flowsheet Row Most Recent Value  SDOH Interventions   Food Insecurity Interventions Intervention  Not Indicated  Transportation Interventions Intervention Not Indicated        CCM Care Plan  Allergies  Allergen Reactions   Sulfonamide Derivatives Hives and Rash    Outpatient Encounter Medications as of 12/07/2020  Medication Sig   acetaminophen (TYLENOL) 325 MG tablet Take 2 tablets (650 mg total) by mouth every 6 (six) hours.   albuterol (VENTOLIN HFA) 108 (90 Base) MCG/ACT inhaler Inhale into the lungs.   ALPRAZolam (XANAX) 0.25 MG tablet Take 1 tablet (0.25 mg total) by mouth 2 (two) times daily as needed for anxiety.   ammonium lactate (AMLACTIN) 12 % cream Apply topically as needed for dry skin.   aspirin EC 81 MG tablet Take 1 po BID x 6 weeks then resume 1 po daily   atenolol (TENORMIN) 50 MG tablet TAKE 1 TABLET BY MOUTH DAILY   Calcium Carbonate-Vitamin D3 600-400 MG-UNIT TABS Take 1 tablet by mouth daily.   diclofenac sodium (VOLTAREN) 1 % GEL Apply 2 g topically 4 (four) times daily. Rub into affected area of foot 2 to 4 times daily   DULoxetine (CYMBALTA) 30 MG capsule TAKE 1 CAPSULE BY MOUTH DAILY ALONG WITH 60 MG FOR TOTAL DAILY DOSE OF 90 MG   DULoxetine (CYMBALTA) 60 MG capsule TAKE 1 CAPSULE BY MOUTH DAILY ALONG WITH 30 MG FOR TOTAL DAILY DOSE OF 90 MG   fluticasone (FLONASE) 50 MCG/ACT nasal spray Place 1 spray into both nostrils daily.   furosemide (LASIX) 40 MG tablet Take 40 mg by mouth daily.   gabapentin (NEURONTIN) 800 MG tablet Take 1 tablet (800 mg total) by mouth 3 (three) times daily.   ketoconazole (NIZORAL) 2 % cream  APPLY TOPICALLY DAILY *FOLLOW UP WITH ADAM MCDONALD [phone]*   mupirocin ointment (BACTROBAN) 2 % APPLY TOPICALLY TO WOUND TWICE DAILY *FOLLOW UP WITH ADAM MCDONALD [phone]*   oxyCODONE (OXY IR/ROXICODONE) 5 MG immediate release tablet Take by mouth.   pantoprazole (PROTONIX) 40 MG tablet TAKE 1 TABLET BY MOUTH ONCE DAILY   rosuvastatin (CRESTOR) 40 MG tablet TAKE 1 TABLET BY MOUTH EACH EVENING   ammonium lactate (LAC-HYDRIN) 12  % lotion Apply topically. (Patient not taking: Reported on 12/07/2020)   bisacodyl (DULCOLAX) 10 MG suppository Place 1 suppository (10 mg total) rectally daily as needed for moderate constipation. (Patient not taking: Reported on 12/07/2020)   gabapentin (NEURONTIN) 300 MG capsule Take 1 capsule by mouth 3 (three) times daily. (Patient not taking: Reported on 12/07/2020)   MUCINEX 600 MG 12 hr tablet Take 600 mg by mouth 2 (two) times daily. (Patient not taking: Reported on 12/07/2020)   senna-docusate (SENOKOT-S) 8.6-50 MG tablet Take 1 tablet by mouth 2 (two) times daily. (Patient not taking: Reported on 12/07/2020)   No facility-administered encounter medications on file as of 12/07/2020.    Patient Active Problem List   Diagnosis Date Noted   Closed left hip fracture ([place]) 08/25/2020   Leukocytosis 08/25/2020   Hyperglycemia 08/25/2020   Elevated brain natriuretic peptide (BNP) level 08/25/2020   GERD (gastroesophageal reflux disease) 08/25/2020   Cerebrovascular accident (CVA) due to stenosis of basilar artery ([place]) 07/17/2019   Dyspnea 29/56/2130   Acute diastolic CHF (congestive heart failure) ([place]) 07/09/2019   Chest pain    Coronary artery calcification seen on CT scan    Gait abnormality 01/24/2019   Tremor 01/24/2019   Memory loss 01/24/2019   Chronic bilateral low back pain without sciatica 06/06/2018   At risk for falls 06/06/2018   Senile osteoporosis 06/06/2018   Atelectasis of left lung 04/06/2018   Acute encephalopathy 02/09/2018   Urinary tract infection due to extended-spectrum beta lactamase (ESBL) producing Escherichia coli 02/02/2018   Atrial fibrillation, chronic ([place]) 02/02/2018   Constipation 01/31/2018   Urolithiasis 01/30/2018   T12 compression fracture ([place]) 09/04/2017   Fall at home, initial encounter 07/03/2017   Tachycardia 02/03/2017   Chronic pain syndrome 02/02/2017   Dizziness 12/15/2016   Inflammation of sacroiliac joint ([place]) 05/07/2015   Lumbar  spondylosis with myelopathy 02/11/2014   Peripheral polyneuropathy 02/11/2014   Opioid dependence ([place]) 02/11/2014   Abnormal EKG 11/21/2013   Displacement of lumbar intervertebral disc without myelopathy 11/18/2013   Lumbar radiculopathy 11/18/2013   [name] (abdominal aortic aneurysm) ([place]) 09/30/2013   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[place]) 01/03/2013   OSA (obstructive sleep apnea) 06/29/2012   Dementia ([place]) 06/29/2012   Pre-diabetes 06/29/2012   Hypoxia, sleep related 06/29/2012   RA (rheumatoid arthritis) ([place]) 06/29/2012   Nonspecific abnormal results of cardiovascular function study 06/02/2011   Nonspecific abnormal electrocardiogram (ECG) (EKG) 05/18/2011   Osteoporosis 04/09/2008   Hyperlipidemia 05/12/2006   DEPENDENT EDEMA, LEGS, BILATERAL 05/12/2006   PERIPHERAL NEUROPATHY 03/11/2006   Essential hypertension 03/11/2006    Conditions to be addressed/monitored:Atrial Fibrillation and HTN  Care Plan : Atrial Fibrillation (Adult)  Updates made by Kassie Mends, RN since 12/07/2020 12:00 AM     Problem: Dysrhythmia (Atrial Fibrillation)   Priority: Medium     Long-Range Goal: Heart Rate and Rhythm Monitored and Managed   Start Date: 12/07/2020  Expected End Date: 06/09/2021  This Visit's Progress: On track  Priority: Medium  Note:   Current Barriers:  Knowledge deficits  related to self health management of chronic afib Knowledge Deficits related to atrial fibrillation action plan No Advanced Directives in place Unable to self administer medications as prescribed- son provides oversight for medications Unable to perform ADLs independently- pt lives with son who is primary caregiver and there are 2 other sons in the home, pt is able to walk with assistance of walker, has wheelchair and ramp, had hip fracture in April and is still not currently at her previous level of independence but is working towards this, pt had home health PT and tries to complete the chair exercises  prescribed.  Pt has had left lateral ankle lesion which is sore but no broken skin, patient's son is going to make appointment with podiatrist to have checked as pt does have regular follow up with podiatrist. Patient has PCS services M-F for 3 hours daily to assist with bathing and other ADL's. Unable to perform IADLs independently- son/ family assists Nurse Case Manager Clinical Goal(s):  patient will take all medications exactly as prescribed and will call provider for medication related questions patient will verbalize understanding of Afib Action Plan and when to call doctor patient will take all medications exactly as prescribed and will call provider for medication related questions patient will attend all scheduled medical appointments:  the patient will work with the care management team towards completion of advanced directives Interventions:  Collaboration with [name], MD regarding development and update of comprehensive plan of care as evidenced by provider attestation and co-signature Inter-disciplinary care team collaboration (see longitudinal plan of care) Basic overview and discussion of afib disease state Afib action plan reviewed Provided education to patient and/or caregiver about advanced directives   Mailed advanced directives packet to patient's home Reviewed medications with patient's son and discussed importance of taking as prescribed. Provided patient with educational materials related to falls and advanced directives. Reviewed scheduled/upcoming provider appointments including: Dr. Stanford Breed 9/22, Max [place] (podiatrist) 9/29 Discussed plans with patient for ongoing care management follow up and provided patient with direct contact information for care management team Reviewed safety precautions, importance of using walker, DME as needed Sent in basket to Dr. Dennard Schaumann reporting pt has not worn oxygen in 2-3 weeks and O2 saturation is >90 as son checks it daily,  per son request, does pt still need oxygen or use as PRN basis, son also wants to know primary care provider recommendation for audiologist to have hearing checked and pursue obtaining hearing aides. Self-Care Activities: Attends all scheduled provider appointments Calls provider office for new concerns or questions Patient Goals: - check pulse (heart) rate once a day - make a plan to exercise regularly- continue chair exercises prescribed by physical therapist - make a plan to eat healthy - take medicine as prescribed  - look over educational material mailed to you- advanced directives and falls - please complete advanced directives- call RN care manager if any questions at [phone] Follow Up Plan: Telephone follow up appointment with care management team member scheduled for:   01/22/2021    Care Plan : Hypertension (Adult)  Updates made by Kassie Mends, RN since 12/07/2020 12:00 AM     Problem: Hypertension (Hypertension)   Priority: Medium     Long-Range Goal: Hypertension Monitored   Start Date: 12/07/2020  Expected End Date: 06/09/2021  This Visit's Progress: On track  Priority: Medium  Note:   Objective:  Last practice recorded BP readings:  BP Readings from Last 3 Encounters:  10/23/20 122/78  10/07/20  110/82  09/11/20 104/69   Most recent eGFR/CrCl: No results found for: EGFR  No components found for: CRCL Current Barriers:  Knowledge Deficits related to basic understanding of hypertension pathophysiology and self care management- pt does not have blood pressure cuff, son states he will look in [employer] benefit catalog to obtain a cuff and start checking blood pressure. Unable to self administer medications as prescribed- son provides oversight Unable to perform ADLs independently- PCS aide and sons assist pt as needed (bathing, cooking, medication oversight, etc), pt had fall back in April with hip fracture and is still recovering. Unable to perform IADLs  independently- sons assist Case Manager Clinical Goal(s):  patient will verbalize understanding of plan for hypertension management patient will attend all scheduled medical appointments patient will demonstrate improved adherence to prescribed treatment plan for hypertension as evidenced by taking all medications as prescribed, monitoring and recording blood pressure as directed, adhering to low sodium/DASH diet Interventions:  Collaboration with [name], MD regarding development and update of comprehensive plan of care as evidenced by provider attestation and co-signature Inter-disciplinary care team collaboration (see longitudinal plan of care) Evaluation of current treatment plan related to hypertension self management and patient's adherence to plan as established by provider. Provided education to patient re: stroke prevention, s/s of heart attack and stroke, DASH diet, complications of uncontrolled blood pressure Reviewed medications with patient and discussed importance of compliance Discussed plans with patient for ongoing care management follow up and provided patient with direct contact information for care management team Advised patient, providing education and rationale, to monitor blood pressure 3 x per week and record, calling PCP for findings outside established parameters.  Reviewed scheduled/upcoming provider appointments including: Dr. Stanford Breed 9/22  Max [place] (podiatrist) 9/29 Encouraged son to look at benefit catalog from [employer] to obtain blood pressure cuff Reviewed fall precautions Self-Care Activities:  Attends all scheduled provider appointments Calls provider office for new concerns, questions, or BP outside discussed parameters Checks BP and records as discussed Follows a low sodium diet/DASH diet Patient Goals: - check blood pressure 3 times per week - choose a place to take my blood pressure (home, clinic or office, retail store) - write  blood pressure results in a log or diary  - please look at [employer] for blood pressure cuff - follow low salt diet- avoid salty snacks, fast food. - look over educational material mailed to you- low sodium diet - continue to do your chair exercises - please be careful when walking, keep pathways clear Follow Up Plan: Telephone follow up appointment with care management team member scheduled for:  01/22/2021      Plan:Telephone follow up appointment with care management team member scheduled for:  01/22/2021  Jacqlyn Larsen [facility], BSN RN Case Manager [facility] [phone]

## 2020-12-08 ENCOUNTER — Other Ambulatory Visit: Payer: Self-pay | Admitting: Family Medicine

## 2020-12-08 DIAGNOSIS — H9193 Unspecified hearing loss, bilateral: Secondary | ICD-10-CM

## 2020-12-09 ENCOUNTER — Other Ambulatory Visit: Payer: Self-pay | Admitting: Neurology

## 2020-12-10 ENCOUNTER — Other Ambulatory Visit: Payer: Self-pay | Admitting: Family Medicine

## 2020-12-18 ENCOUNTER — Ambulatory Visit: Payer: 59 | Admitting: *Deleted

## 2020-12-18 DIAGNOSIS — I482 Chronic atrial fibrillation, unspecified: Secondary | ICD-10-CM

## 2020-12-18 DIAGNOSIS — I10 Essential (primary) hypertension: Secondary | ICD-10-CM

## 2020-12-18 NOTE — Chronic Care Management (AMB) (Signed)
Chronic Care Management   CCM RN Visit Note  12/18/2020 Name: [name] MRN: [id number] DOB: [birth year]  Subjective: [name] is a 87 y.o. year old female who is a primary care patient of Pickard, [name], MD. The care management team was consulted for assistance with disease management and care coordination needs.    Engaged with patient by telephone for follow up visit in response to provider referral for case management and/or care coordination services.   Consent to Services:  The patient was given information about Chronic Care Management services, agreed to services, and gave verbal consent prior to initiation of services.  Please see initial visit note for detailed documentation.   Patient agreed to services and verbal consent obtained.   Assessment: Review of patient past medical history, allergies, medications, health status, including review of consultants reports, laboratory and other test data, was performed as part of comprehensive evaluation and provision of chronic care management services.   SDOH (Social Determinants of Health) assessments and interventions performed:    CCM Care Plan  Allergies  Allergen Reactions   Sulfonamide Derivatives Hives and Rash    Outpatient Encounter Medications as of 12/18/2020  Medication Sig   acetaminophen (TYLENOL) 325 MG tablet Take 2 tablets (650 mg total) by mouth every 6 (six) hours.   albuterol (VENTOLIN HFA) 108 (90 Base) MCG/ACT inhaler Inhale into the lungs.   ALPRAZolam (XANAX) 0.25 MG tablet Take 1 tablet (0.25 mg total) by mouth 2 (two) times daily as needed for anxiety.   ammonium lactate (AMLACTIN) 12 % cream Apply topically as needed for dry skin.   ammonium lactate (LAC-HYDRIN) 12 % lotion Apply topically. (Patient not taking: Reported on 12/07/2020)   aspirin EC 81 MG tablet Take 1 po BID x 6 weeks then resume 1 po daily   atenolol (TENORMIN) 50 MG tablet TAKE 1 TABLET BY MOUTH DAILY   bisacodyl  (DULCOLAX) 10 MG suppository Place 1 suppository (10 mg total) rectally daily as needed for moderate constipation. (Patient not taking: Reported on 12/07/2020)   Calcium Carbonate-Vitamin D3 600-400 MG-UNIT TABS Take 1 tablet by mouth daily.   diclofenac sodium (VOLTAREN) 1 % GEL Apply 2 g topically 4 (four) times daily. Rub into affected area of foot 2 to 4 times daily   DULoxetine (CYMBALTA) 30 MG capsule TAKE 1 CAPSULE BY MOUTH DAILY ALONG WITH 60 MG FOR TOTAL DAILY DOSE OF 90 MG   DULoxetine (CYMBALTA) 60 MG capsule TAKE 1 CAPSULE BY MOUTH DAILY ALONG WITH 30 MG FOR TOTAL DAILY DOSE OF 90 MG   fluticasone (FLONASE) 50 MCG/ACT nasal spray Place 1 spray into both nostrils daily.   furosemide (LASIX) 40 MG tablet Take 40 mg by mouth daily.   gabapentin (NEURONTIN) 300 MG capsule Take 1 capsule by mouth 3 (three) times daily. (Patient not taking: Reported on 12/07/2020)   gabapentin (NEURONTIN) 800 MG tablet Take 1 tablet (800 mg total) by mouth 3 (three) times daily.   ketoconazole (NIZORAL) 2 % cream APPLY TOPICALLY DAILY *FOLLOW UP WITH ADAM MCDONALD [phone]*   MUCINEX 600 MG 12 hr tablet Take 600 mg by mouth 2 (two) times daily. (Patient not taking: Reported on 12/07/2020)   mupirocin ointment (BACTROBAN) 2 % APPLY TOPICALLY TO WOUND TWICE DAILY *FOLLOW UP WITH ADAM MCDONALD [phone]*   oxyCODONE (OXY IR/ROXICODONE) 5 MG immediate release tablet Take by mouth.   pantoprazole (PROTONIX) 40 MG tablet TAKE 1 TABLET BY MOUTH ONCE DAILY   rosuvastatin (CRESTOR) 40  MG tablet TAKE 1 TABLET BY MOUTH EACH EVENING   senna-docusate (SENOKOT-S) 8.6-50 MG tablet Take 1 tablet by mouth 2 (two) times daily. (Patient not taking: Reported on 12/07/2020)   No facility-administered encounter medications on file as of 12/18/2020.    Patient Active Problem List   Diagnosis Date Noted   Closed left hip fracture ([place]) 08/25/2020   Leukocytosis 08/25/2020   Hyperglycemia 08/25/2020   Elevated brain natriuretic  peptide (BNP) level 08/25/2020   GERD (gastroesophageal reflux disease) 08/25/2020   Cerebrovascular accident (CVA) due to stenosis of basilar artery ([place]) 07/17/2019   Dyspnea 37/48/2707   Acute diastolic CHF (congestive heart failure) ([place]) 07/09/2019   Chest pain    Coronary artery calcification seen on CT scan    Gait abnormality 01/24/2019   Tremor 01/24/2019   Memory loss 01/24/2019   Chronic bilateral low back pain without sciatica 06/06/2018   At risk for falls 06/06/2018   Senile osteoporosis 06/06/2018   Atelectasis of left lung 04/06/2018   Acute encephalopathy 02/09/2018   Urinary tract infection due to extended-spectrum beta lactamase (ESBL) producing Escherichia coli 02/02/2018   Atrial fibrillation, chronic ([place]) 02/02/2018   Constipation 01/31/2018   Urolithiasis 01/30/2018   T12 compression fracture ([place]) 09/04/2017   Fall at home, initial encounter 07/03/2017   Tachycardia 02/03/2017   Chronic pain syndrome 02/02/2017   Dizziness 12/15/2016   Inflammation of sacroiliac joint ([place]) 05/07/2015   Lumbar spondylosis with myelopathy 02/11/2014   Peripheral polyneuropathy 02/11/2014   Opioid dependence (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[place]) 01/03/2013   OSA (obstructive sleep apnea) 06/29/2012   Dementia ([place]) 06/29/2012   Pre-diabetes 06/29/2012   Hypoxia, sleep related 06/29/2012   RA (rheumatoid arthritis) ([place]) 06/29/2012   Nonspecific abnormal results of cardiovascular function study 06/02/2011   Nonspecific abnormal electrocardiogram (ECG) (EKG) 05/18/2011   Osteoporosis 04/09/2008   Hyperlipidemia 05/12/2006   DEPENDENT EDEMA, LEGS, BILATERAL 05/12/2006   PERIPHERAL NEUROPATHY 03/11/2006   Essential hypertension 03/11/2006    Conditions to be  addressed/monitored:Atrial Fibrillation and HTN  Care Plan : Atrial Fibrillation (Adult)  Updates made by Kassie Mends, RN since 12/18/2020 12:00 AM     Problem: Dysrhythmia (Atrial Fibrillation)   Priority: Medium     Long-Range Goal: Heart Rate and Rhythm Monitored and Managed   Start Date: 12/07/2020  Expected End Date: 06/09/2021  This Visit's Progress: On track  Recent Progress: On track  Priority: Medium  Note:   Current Barriers:  Knowledge deficits related to self health management of chronic afib Knowledge Deficits related to atrial fibrillation action plan No Advanced Directives in place Unable to self administer medications as prescribed- son provides oversight for medications Unable to perform ADLs independently- pt lives with son who is primary caregiver and there are 2 other sons in the home, pt is able to walk with assistance of walker, has wheelchair and ramp, had hip fracture in April and is still not currently at her previous level of independence but is working towards this, pt had home health PT and tries to complete the chair exercises prescribed.  Pt has had left lateral ankle lesion which is sore but no broken skin, patient's son is going to make appointment with podiatrist to have checked as pt does have regular follow up with podiatrist. Patient has PCS services  M-F for 3 hours daily to assist with bathing and other ADL's. Unable to perform IADLs independently- son/ family assists Nurse Case Manager Clinical Goal(s):  patient will take all medications exactly as prescribed and will call provider for medication related questions patient will verbalize understanding of Afib Action Plan and when to call doctor patient will take all medications exactly as prescribed and will call provider for medication related questions patient will attend all scheduled medical appointments:  the patient will work with the care management team towards completion of advanced  directives Interventions:  Collaboration with [name], MD regarding development and update of comprehensive plan of care as evidenced by provider attestation and co-signature Inter-disciplinary care team collaboration (see longitudinal plan of care) Basic overview and discussion of afib disease state Afib action plan reviewed Provided education to patient and/or caregiver about advanced directives   Mailed advanced directives packet to patient's home Reviewed medications with patient's son and discussed importance of taking as prescribed. Provided patient with educational materials related to falls and advanced directives. Reviewed scheduled/upcoming provider appointments including: Dr. Stanford Breed 9/22, Max [place] (podiatrist) 9/29 Discussed plans with patient for ongoing care management follow up and provided patient with direct contact information for care management team Reviewed safety precautions, importance of using walker, DME as needed Reviewed with patient that per primary care provider- if 02 saturations are above 90% it is okay to use oxygen as needed Self-Care Activities: Attends all scheduled provider appointments Calls provider office for new concerns or questions Patient Goals: - check pulse (heart) rate once a day - make a plan to exercise regularly- continue chair exercises prescribed by physical therapist - make a plan to eat healthy - take medicine as prescribed  - look over educational material mailed to you- advanced directives and falls - please complete advanced directives- call RN care manager if any questions at [phone] - per primary doctor, if 02 saturations are >90% it is okay to use oxygen as needed, please contact your doctor if any questions Follow Up Plan: Telephone follow up appointment with care management team member scheduled for:   01/22/2021    Care Plan : Hypertension (Adult)  Updates made by Kassie Mends, RN since 12/18/2020 12:00 AM      Problem: Hypertension (Hypertension)   Priority: Medium     Long-Range Goal: Hypertension Monitored   Start Date: 12/07/2020  Expected End Date: 06/09/2021  This Visit's Progress: On track  Recent Progress: On track  Priority: Medium  Note:   Objective:  Last practice recorded BP readings:  BP Readings from Last 3 Encounters:  10/23/20 122/78  10/07/20 110/82  09/11/20 104/69   Most recent eGFR/CrCl: No results found for: EGFR  No components found for: CRCL Current Barriers:  Knowledge Deficits related to basic understanding of hypertension pathophysiology and self care management- pt does not have blood pressure cuff, son states he will look in [employer] benefit catalog to obtain a cuff and start checking blood pressure. Unable to self administer medications as prescribed- son provides oversight Unable to perform ADLs independently- PCS aide and sons assist pt as needed (bathing, cooking, medication oversight, etc), pt had fall back in April with hip fracture and is still recovering. Unable to perform IADLs independently- sons assist Case Manager Clinical Goal(s):  patient will verbalize understanding of plan for hypertension management patient will attend all scheduled medical appointments patient will demonstrate improved adherence to prescribed treatment plan for hypertension as evidenced by taking all medications as  prescribed, monitoring and recording blood pressure as directed, adhering to low sodium/DASH diet Interventions:  Collaboration with [name], MD regarding development and update of comprehensive plan of care as evidenced by provider attestation and co-signature Inter-disciplinary care team collaboration (see longitudinal plan of care) Evaluation of current treatment plan related to hypertension self management and patient's adherence to plan as established by provider. Provided education to patient re: stroke prevention, s/s of heart attack and  stroke, DASH diet, complications of uncontrolled blood pressure Reviewed medications with patient and discussed importance of compliance Discussed plans with patient for ongoing care management follow up and provided patient with direct contact information for care management team Advised patient, providing education and rationale, to monitor blood pressure 3 x per week and record, calling PCP for findings outside established parameters.  Reviewed scheduled/upcoming provider appointments including: Dr. Stanford Breed 9/22  Max Hyatt (podiatrist) 9/29 Encouraged son to look at benefit catalog from [employer] to obtain blood pressure cuff Reviewed fall precautions Informed son Audelia Acton referral to audiologist is being completed Self-Care Activities:  Attends all scheduled provider appointments Calls provider office for new concerns, questions, or BP outside discussed parameters Checks BP and records as discussed Follows a low sodium diet/DASH diet Patient Goals: - check blood pressure 3 times per week - choose a place to take my blood pressure (home, clinic or office, retail store) - write blood pressure results in a log or diary  - please look at [employer] for blood pressure cuff - follow low salt diet- avoid salty snacks, fast food. - look over educational material mailed to you- low sodium diet - continue to do your chair exercises - please be careful when walking, keep pathways clear - referral for audiologist is being completed by your primary care doctor Follow Up Plan: Telephone follow up appointment with care management team member scheduled for:  01/22/2021      Plan:Telephone follow up appointment with care management team member scheduled for:  01/22/2021  Jacqlyn Larsen [facility], BSN RN Case Manager [facility] [phone]

## 2020-12-18 NOTE — Patient Instructions (Signed)
Visit Information  PATIENT GOALS:  Goals Addressed             This Visit's Progress    Track and Manage Heart Rate and Rhythm-Atrial Fibrillation       Timeframe:  Long-Range Goal Priority:  Medium Start Date:       12/07/2020                      Expected End Date:       06/09/2021                Follow Up Date -  01/22/2021   - check pulse (heart) rate once a day - make a plan to exercise regularly- continue chair exercises prescribed by physical therapist - make a plan to eat healthy - take medicine as prescribed  - look over educational material mailed to you- advanced directives and falls - please complete advanced directives- call RN care manager if any questions at [phone] - per primary doctor, if 02 saturations are >90% it is okay to use oxygen as needed, please contact your doctor if any questions   Why is this important?   Atrial fibrillation may have no symptoms. Sometimes the symptoms get worse or happen more often.  It is important to keep track of what your symptoms are and when they happen.  A change in symptoms is important to discuss with your doctor or nurse.  Being active and healthy eating will also help you manage your heart condition.     Notes:      Track and Manage My Blood Pressure-Hypertension       Timeframe:  Long-Range Goal Priority:  Medium Start Date:           12/07/2020                  Expected End Date:      06/09/2021                 Follow Up Date- 01/22/2021   - check blood pressure 3 times per week - choose a place to take my blood pressure (home, clinic or office, retail store) - write blood pressure results in a log or diary  - please look at [employer] for blood pressure cuff - follow low salt diet- avoid salty snacks, fast food. - look over educational material mailed to you- low sodium diet - continue to do your chair exercises - please be careful when walking, keep pathways clear - referral for audiologist is  being completed by your primary care doctor   Why is this important?   You won't feel high blood pressure, but it can still hurt your blood vessels.  High blood pressure can cause heart or kidney problems. It can also cause a stroke.  Making lifestyle changes like losing a little weight or eating less salt will help.  Checking your blood pressure at home and at different times of the day can help to control blood pressure.  If the doctor prescribes medicine remember to take it the way the doctor ordered.  Call the office if you cannot afford the medicine or if there are questions about it.     Notes:         The patient verbalized understanding of instructions, educational materials, and care plan provided today and declined offer to receive copy of patient instructions, educational materials, and care plan.   Telephone follow up appointment with care  management team member scheduled for:  01/22/2021  Jacqlyn Larsen [facility], BSN RN Case Manager [facility] [phone]

## 2020-12-21 ENCOUNTER — Other Ambulatory Visit: Payer: Self-pay | Admitting: Neurology

## 2020-12-24 ENCOUNTER — Other Ambulatory Visit: Payer: Self-pay

## 2020-12-24 ENCOUNTER — Ambulatory Visit: Payer: 59 | Attending: Audiologist | Admitting: Audiologist

## 2020-12-24 DIAGNOSIS — H903 Sensorineural hearing loss, bilateral: Secondary | ICD-10-CM | POA: Diagnosis not present

## 2020-12-24 NOTE — Procedures (Signed)
  Outpatient Audiology and [place], [place]  [id number] [phone]  AUDIOLOGICAL  EVALUATION  NAME: [name]     DOB:   Apr 25, 1934      MRN: [id number]                                                                                     DATE: 12/24/2020     REFERENT: [name], MD STATUS: Outpatient DIAGNOSIS: Sensorineural Hearing Loss Bilateral     History: [name], 87 y.o., was seen for an audiological evaluation. [name] was accompanied to the appointment by her daughter. [name] lives with her sons who are her primary care takers.  [name] is receiving a hearing evaluation due to concerns for inability to hear people on a daily basis. [name] has difficulty hearing in all situations. Her daughter says they are always yelling at her. This difficulty began gradually. No pain or pressure reported in either ear. Tinnitus present in both ears intermittently.  Medical history positive for dementia which is a risk factor for hearing loss. No other relevant case history reported.    Evaluation:  Otoscopy cerumen with no view of the tympanic membranes, bilaterally Tympanometry results were consistent with normal middle ear function and that sound is bypassing cerumen, bilaterally   Audiometric testing was completed using conventional audiometry with insert transducer. Speech Recognition Thresholds were consistent with pure tone averages. Word Recognition was fair at an elevated level. Pure tone thresholds show moderate to severe sensorineural hearing loss in both ears. Test results are consistent with age related changes.   Results:  The test results were reviewed with [name] and her daughter. [name] needs strong hearing aids for both ears. She still has decent ability to understand speech at louder levels. Hearing aids will help her greatly to communicate more easily in daily life. Her daughter was given the number of [facility] to help  [name] get set up through her insurance benefits for hearing aids. She was given two copies of her audiogram and told where ever she goes for hearing aids she needs to bring these copies with her. She and her daughter reported understanding.    Recommendations: Amplification is necessary for both ears. Hearing aids can be purchased from a variety of locations. See provided list for locations in the [place] area. A referral for an amplified captioned phone has been put in. [employer] will call the family to discuss needs and set up.     Alfonse Alpers  Audiologist, Au.D., CCC-A 12/24/2020  4:41 PM  Cc: [name], MD

## 2020-12-28 ENCOUNTER — Other Ambulatory Visit: Payer: Self-pay | Admitting: Neurology

## 2020-12-29 ENCOUNTER — Other Ambulatory Visit: Payer: Self-pay

## 2020-12-29 ENCOUNTER — Ambulatory Visit (HOSPITAL_COMMUNITY)
Admission: RE | Admit: 2020-12-29 | Discharge: 2020-12-29 | Disposition: A | Discharge status: Home / Self Care | Payer: 59 | Source: Ambulatory Visit | Attending: Family Medicine | Admitting: Family Medicine

## 2020-12-29 ENCOUNTER — Ambulatory Visit (INDEPENDENT_AMBULATORY_CARE_PROVIDER_SITE_OTHER): Payer: 59 | Admitting: Family Medicine

## 2020-12-29 VITALS — BP 120/64 | HR 82 | Temp 98.0°F | Resp 16

## 2020-12-29 DIAGNOSIS — G894 Chronic pain syndrome: Secondary | ICD-10-CM | POA: Diagnosis not present

## 2020-12-29 DIAGNOSIS — R7303 Prediabetes: Secondary | ICD-10-CM

## 2020-12-29 DIAGNOSIS — R059 Cough, unspecified: Secondary | ICD-10-CM | POA: Insufficient documentation

## 2020-12-29 DIAGNOSIS — Z8673 Personal history of transient ischemic attack (TIA), and cerebral infarction without residual deficits: Secondary | ICD-10-CM | POA: Diagnosis not present

## 2020-12-29 DIAGNOSIS — I482 Chronic atrial fibrillation, unspecified: Secondary | ICD-10-CM | POA: Diagnosis not present

## 2020-12-29 DIAGNOSIS — I714 Abdominal aortic aneurysm, without rupture, unspecified: Secondary | ICD-10-CM

## 2020-12-29 DIAGNOSIS — R300 Dysuria: Secondary | ICD-10-CM

## 2020-12-29 LAB — MICROSCOPIC MESSAGE

## 2020-12-29 LAB — URINALYSIS, ROUTINE W REFLEX MICROSCOPIC
Bilirubin Urine: NEGATIVE
Glucose, UA: NEGATIVE
Hgb urine dipstick: NEGATIVE
Hyaline Cast: NONE SEEN /LPF
Ketones, ur: NEGATIVE
Nitrite: POSITIVE — AB
RBC / HPF: NONE SEEN /HPF (ref 0–2)
Specific Gravity, Urine: 1.02 (ref 1.001–1.035)
pH: 6.5 (ref 5.0–8.0)

## 2020-12-29 MED ORDER — CEPHALEXIN 500 MG PO CAPS: 500.0000 mg | ORAL_CAPSULE | Freq: Three times a day (TID) | ORAL | 0 refills | Status: DC

## 2020-12-29 MED ORDER — FUROSEMIDE 40 MG PO TABS: 40.0000 mg | ORAL_TABLET | Freq: Every day | ORAL | 3 refills | Status: DC

## 2020-12-29 NOTE — Progress Notes (Signed)
Subjective:    Patient ID: [name], female    DOB: [birth year], 87 y.o.   MRN: [id number]  HPI MRI l-spine (2015): There is an abdominal aortic aneurysm increased in size since the  prior study. Measures a maximum of 3.6 cm. Recommend ultrasound  surveillance. Mild stable common bile duct dilatation. Renal cysts  are noted.   L1-2: Diffuse bulging uncovered disc and osteophytic ridging along  with moderate facet disease contributing to mild bilateral lateral  recess stenosis, right greater than left. There is also moderate  right foraminal stenosis. These findings are progressive.   L2-3: Diffuse bulging degenerated annulus, short pedicles and  advanced facet disease along with a broad-based right paracentral,  right foraminal and far right lateral disc protrusion all  contributing to significant spinal, bilateral lateral recess  stenosis and right foraminal stenosis. These findings are  significantly progressive since the prior MRI.   L3-4: Diffuse bulging annulus, osteophytic ridging and advanced  facet disease with mild to moderate bilateral lateral recess  stenosis and mild bilateral foraminal stenosis. Mild spinal  stenosis.   L4-5: Severe facet disease and degenerative anterolisthesis of L4.  There is a bulging uncovered disc and osteophytic ridging  contributing to severe spinal and bilateral lateral recess stenosis  mildly progressive since the prior MRI.   L5-S1: Severe facet disease but no disc protrusions, spinal or  foraminal stenosis.   Patient has a 4 cm [name] last evaluated in 12/21 and monitored annually by Dr. Stanford Breed.  She has a history of chronic microvascular ischemic changes on her MRI and even previous CVA- (MRI 10/20) and I feel she has vascular dementia. IMPRESSION: This MRI of the brain without contrast shows the following: 1.   Chronic lacunar infarctions in the upper right pons in the right lentiform nucleus, unchanged compared to the  07/03/2017 MRI scan. 2.   Extensive T2/flair hyperintense foci in the hemispheres consistent with moderately severe chronic microvascular ischemic change.  None of the foci appear to be acute. 3.   Moderate generalized cortical atrophy and mild corpus callosum atrophy, unchanged compared to the previous MRI.   4.   Partially empty sella turcica could be incidental or due to chronic increased intracranial pressures.  It is unchanged compared to the previous MRI 5.   There are no acute findings.  Patient is here today with her son for follow-up.  She is still in atrial fibrillation.  She is only taking aspirin due to concern about fall risk and intracranial hemorrhage.  Son states that recently she has become increasingly more confused.  He states that her urine has a foul odor and she also reports some dysuria.  Today on urinalysis, there is +2 leukocyte Estrace, positive nitrates and no blood.  This suggest a urinary tract infection as well.  She denies any chest pain shortness of breath or dyspnea on exertion.  Her blood pressure today is excellent at 120/64.  She is due to follow-up on fasting lab work.  She continues to have low back pain however her son is giving her oxycodone sparingly and only when she is in extreme pain.  Now that she is sedentary and spends majority of her day in a wheelchair, the patient has very little pain and does not require oxycodone regularly.    Past Medical History:  Diagnosis Date   [name] (abdominal aortic aneurysm) ([place]) 09/2013   3.6 cm   Acute and chronic respiratory failure with hypoxia (HCC)    Acute  delirium    Acute encephalopathy    Acute pyelonephritis    Atrial fibrillation (HCC)    Chronic pain syndrome    Constipation    Dementia (HCC)    Dependent edema    bilateral legs    Depression    Diabetes mellitus    pre-diabetes   Diverticulosis    Diverticulosis    Dysuria    E coli bacteremia    Fall    Gross hematuria    Hydronephrosis with renal  and ureteral calculous obstruction    Hyperlipidemia    Hypertension    Hypoxemia    Idiopathic peripheral autonomic neuropathy    Infestation by bed bug    Nephrolithiasis    OSA on CPAP    Osteoarthritis    Osteopenia    T=  -2.1 in hip   Persistent mood (affective) disorder, unspecified (HCC)    Prediabetes    Rheumatoid arthritis (HCC)    Rheumatoid arthritis(714.0)    S/P carpal tunnel release    Sepsis secondary to UTI [facility])    Spinal stenosis    Unspecified fracture of fifth lumbar vertebra, initial encounter for closed fracture ([place])    Unspecified fracture of t11-T12 vertebra, initial encounter for closed fracture ([place])    Urinary incontinence    Urinary tract infection    hx of    Urolithiasis    Past Surgical History:  Procedure Laterality Date   APPENDECTOMY     CARPAL TUNNEL RELEASE  12/27/2006   Right subcutaneous ulnar nerve transfer -- Right open carpal tunnel release   CHOLECYSTECTOMY     CYSTOSCOPY W/ URETERAL STENT PLACEMENT Right 01/30/2018   Procedure: CYSTOSCOPY WITH STENT REPLACEMENT retrograde pylegram;  Surgeon: Ardis Hughs, MD;  Location: [facility];  Service: Urology;  Laterality: Right;   CYSTOSCOPY WITH URETEROSCOPY AND STENT PLACEMENT Right 03/01/2018   Procedure: CYSTOSCPY/RIGHT URETEROSCOPY HOLMIUM LASER LITHOTRIPSY AND STENT EXCHANGE;  Surgeon: Ardis Hughs, MD;  Location: [facility];  Service: Urology;  Laterality: Right;   FOOT SURGERY     HIP ARTHROPLASTY Left 08/27/2020   Procedure: ARTHROPLASTY BIPOLAR HIP (HEMIARTHROPLASTY);  Surgeon: Mordecai Rasmussen, MD;  Location: [facility];  Service: Orthopedics;  Laterality: Left;   HOLMIUM LASER APPLICATION Right [id number]   Procedure: HOLMIUM LASER APPLICATION;  Surgeon: Ardis Hughs, MD;  [facility]: [facility];  Service: Urology;  Laterality: Right;   JOINT REPLACEMENT     BTKR, RTHR   TOTAL KNEE ARTHROPLASTY  08/21/2007   Right total knee replacement   Current Outpatient Medications on  File Prior to Visit  Medication Sig Dispense Refill   acetaminophen (TYLENOL) 325 MG tablet Take 2 tablets (650 mg total) by mouth every 6 (six) hours.     albuterol (VENTOLIN HFA) 108 (90 Base) MCG/ACT inhaler Inhale into the lungs.     ALPRAZolam (XANAX) 0.25 MG tablet Take 1 tablet (0.25 mg total) by mouth 2 (two) times daily as needed for anxiety. 14 tablet 0   ammonium lactate (AMLACTIN) 12 % cream Apply topically as needed for dry skin. 385 g 0   ammonium lactate (LAC-HYDRIN) 12 % lotion Apply topically. (Patient not taking: Reported on 12/07/2020)     aspirin EC 81 MG tablet Take 1 po BID x 6 weeks then resume 1 po daily 30 tablet 11   atenolol (TENORMIN) 50 MG tablet TAKE 1 TABLET BY MOUTH DAILY 30 tablet 10   bisacodyl (DULCOLAX) 10 MG suppository Place 1 suppository (10 mg total)  rectally daily as needed for moderate constipation. (Patient not taking: Reported on 12/07/2020) 12 suppository 0   Calcium Carbonate-Vitamin D3 600-400 MG-UNIT TABS Take 1 tablet by mouth daily.     diclofenac sodium (VOLTAREN) 1 % GEL Apply 2 g topically 4 (four) times daily. Rub into affected area of foot 2 to 4 times daily 100 g 2   DULoxetine (CYMBALTA) 30 MG capsule TAKE 1 CAPSULE BY MOUTH DAILY ALONG WITH 60 MG FOR TOTAL DAILY DOSE OF 90 MG 20 capsule 10   DULoxetine (CYMBALTA) 60 MG capsule TAKE 1 CAPSULE BY MOUTH DAILY ALONG WITH 30 MG FOR TOTAL DAILY DOSE OF 90 MG 20 capsule 10   fluticasone (FLONASE) 50 MCG/ACT nasal spray Place 1 spray into both nostrils daily.     furosemide (LASIX) 40 MG tablet Take 40 mg by mouth daily.     gabapentin (NEURONTIN) 300 MG capsule Take 1 capsule by mouth 3 (three) times daily. (Patient not taking: Reported on 12/07/2020)     gabapentin (NEURONTIN) 800 MG tablet Take 1 tablet (800 mg total) by mouth 3 (three) times daily. 270 tablet 0   ketoconazole (NIZORAL) 2 % cream APPLY TOPICALLY DAILY *FOLLOW UP WITH ADAM MCDONALD [id number]* 60 g 10   MUCINEX 600 MG 12 hr tablet  Take 600 mg by mouth 2 (two) times daily. (Patient not taking: Reported on 12/07/2020)     mupirocin ointment (BACTROBAN) 2 % APPLY TOPICALLY TO WOUND TWICE DAILY *FOLLOW UP WITH ADAM MCDONALD [id number]* 30 g 10   oxyCODONE (OXY IR/ROXICODONE) 5 MG immediate release tablet Take by mouth.     pantoprazole (PROTONIX) 40 MG tablet TAKE 1 TABLET BY MOUTH ONCE DAILY 30 tablet 10   rosuvastatin (CRESTOR) 40 MG tablet TAKE 1 TABLET BY MOUTH EACH EVENING 30 tablet 10   senna-docusate (SENOKOT-S) 8.6-50 MG tablet Take 1 tablet by mouth 2 (two) times daily. (Patient not taking: Reported on 12/07/2020)     No current facility-administered medications on file prior to visit.   Allergies  Allergen Reactions   Sulfonamide Derivatives Hives and Rash   Social History   Socioeconomic History   Marital status: Divorced    Spouse name: Not on file   Number of children: 6   Years of education: 8th grade   Highest education level: Not on file  Occupational History   Occupation: Retired  Tobacco Use   Smoking status: Never   Smokeless tobacco: Never  Vaping Use   Vaping Use: Never used  Substance and Sexual Activity   Alcohol use: No   Drug use: No   Sexual activity: Not on file  Other Topics Concern   Not on file  Social History Narrative   Lives at home with 2 sons   She has four living children.   Right-handed.   Two cups caffeine per day.   Social Determinants of Health   Financial Resource Strain: Low Risk    Difficulty of Paying Living Expenses: Not hard at all  Food Insecurity: No Food Insecurity   Worried About Charity fundraiser in the Last Year: Never true   [place] in the Last Year: Never true  Transportation Needs: No Transportation Needs   Lack of Transportation (Medical): No   Lack of Transportation (Non-Medical): No  Physical Activity: Inactive   Days of Exercise per Week: 0 days   Minutes of Exercise per Session: 0 min  Stress: No Stress Concern Present    Feeling of Stress :  Not at all  Social Connections: Socially Isolated   Frequency of Communication with Friends and Family: Once a week   Frequency of Social Gatherings with Friends and Family: More than three times a week   Attends Religious Services: Never   Marine scientist or Organizations: No   Attends Music therapist: Never   Marital Status: Divorced  Human resources officer Violence: Not At Risk   Fear of Current or Ex-Partner: No   Emotionally Abused: No   Physically Abused: No   Sexually Abused: No     Review of Systems  All other systems reviewed and are negative.     Objective:   Physical Exam Constitutional:      General: She is not in acute distress.    Appearance: Normal appearance. She is normal weight. She is not ill-appearing, toxic-appearing or diaphoretic.  Cardiovascular:     Rate and Rhythm: Normal rate. Rhythm irregular.     Heart sounds: Normal heart sounds. No murmur heard.   No gallop.  Pulmonary:     Effort: Pulmonary effort is normal. No tachypnea or respiratory distress.     Breath sounds: Rales present. No wheezing.  Chest:     Chest wall: No tenderness or edema.  Abdominal:     General: There is no distension.     Tenderness: There is no abdominal tenderness. There is no guarding or rebound.  Musculoskeletal:     Right lower leg: No edema.     Left lower leg: No edema.  Neurological:     Mental Status: She is alert.          Assessment & Plan:  Atrial fibrillation, chronic (HCC) - Plan: CBC with Differential/Platelet, COMPLETE METABOLIC PANEL WITH GFR, Lipid panel, Hemoglobin A1c  Pre-diabetes - Plan: CBC with Differential/Platelet, COMPLETE METABOLIC PANEL WITH GFR, Lipid panel, Hemoglobin A1c  History of CVA (cerebrovascular accident) - Plan: CBC with Differential/Platelet, COMPLETE METABOLIC PANEL WITH GFR, Lipid panel, Hemoglobin A1c  Chronic pain syndrome  Abdominal aortic aneurysm ([name]) without rupture  (HCC)  Cough - Plan: DG Chest 2 View Patient has right basilar Rales today on exam.  This is exactly where she had pneumonia in April.  I will repeat a chest x-ray to evaluate further.  However she appears to have a urinary tract infection and this is most likely the cause of her delirium and I will treat this with Keflex 500 mg p.o. 3 times daily for 7 days.  Her [name] is being followed by her cardiologist that her ultrasound was normal in December.  Her chronic pain syndrome has improved now that she is more sedentary and she is not requiring oxycodone frequently.  I explained my concern about risk of falls on the pain medication as well as confusion and the son is trying to limit her intake of pain medication.  Given her history of CVA as well as vascular dementia and atrial fibrillation, I feel the patient would be better protected from stroke on Eliquis.  Now that she is sedentary and not walking and spends the majority of her day in a wheelchair with 24/7 supervision, I feel her fall risk is less.  Therefore I want a check a CBC and if her hemoglobin is in the normal range, I would suggest switching from her aspirin back to Eliquis.  I will check an A1c to monitor her prediabetes.

## 2020-12-29 NOTE — Addendum Note (Signed)
Addended by: Sheral Flow on: 12/29/2020 04:22 PM   Modules accepted: Orders

## 2020-12-30 DIAGNOSIS — I482 Chronic atrial fibrillation, unspecified: Secondary | ICD-10-CM | POA: Diagnosis not present

## 2020-12-30 DIAGNOSIS — I10 Essential (primary) hypertension: Secondary | ICD-10-CM | POA: Diagnosis not present

## 2020-12-30 LAB — COMPLETE METABOLIC PANEL WITH GFR
AG Ratio: 1.4 (calc) (ref 1.0–2.5)
ALT: 7 U/L (ref 6–29)
AST: 15 U/L (ref 10–35)
Albumin: 3.8 g/dL (ref 3.6–5.1)
Alkaline phosphatase (APISO): 58 U/L (ref 37–153)
BUN: 15 mg/dL (ref 7–25)
CO2: 31 mmol/L (ref 20–32)
Calcium: 9.4 mg/dL (ref 8.6–10.4)
Chloride: 100 mmol/L (ref 98–110)
Creat: 0.93 mg/dL (ref 0.60–0.95)
Globulin: 2.7 g/dL (calc) (ref 1.9–3.7)
Glucose, Bld: 106 mg/dL — ABNORMAL HIGH (ref 65–99)
Potassium: 4.7 mmol/L (ref 3.5–5.3)
Sodium: 139 mmol/L (ref 135–146)
Total Bilirubin: 0.6 mg/dL (ref 0.2–1.2)
Total Protein: 6.5 g/dL (ref 6.1–8.1)
eGFR: 61 mL/min/{1.73_m2} (ref >=60)

## 2020-12-30 LAB — LIPID PANEL
Cholesterol: 101 mg/dL (ref <200)
HDL: 44 mg/dL — ABNORMAL LOW (ref >=50)
LDL Cholesterol (Calc): 38 mg/dL (calc)
Non-HDL Cholesterol (Calc): 57 mg/dL (calc) (ref <130)
Total CHOL/HDL Ratio: 2.3 (calc) (ref <5.0)
Triglycerides: 103 mg/dL (ref <150)

## 2020-12-30 LAB — CBC WITH DIFFERENTIAL/PLATELET
Absolute Monocytes: 366 cells/uL (ref 200–950)
Basophils Absolute: 72 cells/uL (ref 0–200)
Basophils Relative: 1.2 %
Eosinophils Absolute: 144 cells/uL (ref 15–500)
Eosinophils Relative: 2.4 %
HCT: 39.3 % (ref 35.0–45.0)
Hemoglobin: 12.3 g/dL (ref 11.7–15.5)
Lymphs Abs: 2034 cells/uL (ref 850–3900)
MCH: 27.7 pg (ref 27.0–33.0)
MCHC: 31.3 g/dL — ABNORMAL LOW (ref 32.0–36.0)
MCV: 88.5 fL (ref 80.0–100.0)
MPV: 10.3 fL (ref 7.5–12.5)
Monocytes Relative: 6.1 %
Neutro Abs: 3384 cells/uL (ref 1500–7800)
Neutrophils Relative %: 56.4 %
Platelets: 200 10*3/uL (ref 140–400)
RBC: 4.44 10*6/uL (ref 3.80–5.10)
RDW: 13.1 % (ref 11.0–15.0)
Total Lymphocyte: 33.9 %
WBC: 6 10*3/uL (ref 3.8–10.8)

## 2020-12-30 LAB — HEMOGLOBIN A1C
Hgb A1c MFr Bld: 6 % of total Hgb — ABNORMAL HIGH (ref <5.7)
Mean Plasma Glucose: 126 mg/dL
eAG (mmol/L): 7 mmol/L

## 2021-01-01 ENCOUNTER — Other Ambulatory Visit: Payer: Self-pay | Admitting: Family Medicine

## 2021-01-01 LAB — URINE CULTURE
MICRO NUMBER:: 12309899
SPECIMEN QUALITY:: ADEQUATE

## 2021-01-01 MED ORDER — NITROFURANTOIN MONOHYD MACRO 100 MG PO CAPS: 100.0000 mg | ORAL_CAPSULE | Freq: Two times a day (BID) | ORAL | 0 refills | Status: DC

## 2021-01-13 ENCOUNTER — Other Ambulatory Visit: Payer: Self-pay | Admitting: Family Medicine

## 2021-01-19 NOTE — Progress Notes (Deleted)
HPI: FU atrial fibrillation and abnormal functional study. I intitially saw in January of 2013 for an abnormal electrocardiogram. She had documented dynamic anterior T-wave changes. Myoview was performed in January 2013 and showed mild reversible defect at the apex. This was felt to possibly represent ischemia. Ejection fraction was 77%. I reviewed and felt low risk. Treated medically. Patient was seen with atrial fibrillation March 2019.  She has fallen several times and therefore anticoagulation discontinued. She has had multiple previous admissions after falls and with encephalopathy; also with renal calculi. Echocardiogram March 2021 showed normal LV function, severe left ventricular hypertrophy and mildly dilated aortic root; consider amyloid. Carotid Dopplers March 2021 showed 1 to 39% bilateral stenosis.  CTA March 2021 showed no pulmonary embolus, 3.7 cm dilatation of descending thoracic aorta, 3.4 cm dilatation of the aortic arch.  Abd ultrasound December 2021 showed 4.1 cm abdominal aortic aneurysm.  ABIs December 2021 noncompressible.  Since I last saw her,   Current Outpatient Medications  Medication Sig Dispense Refill   acetaminophen (TYLENOL) 325 MG tablet Take 2 tablets (650 mg total) by mouth every 6 (six) hours.     albuterol (VENTOLIN HFA) 108 (90 Base) MCG/ACT inhaler Inhale into the lungs.     ALPRAZolam (XANAX) 0.25 MG tablet Take 1 tablet (0.25 mg total) by mouth 2 (two) times daily as needed for anxiety. 14 tablet 0   ammonium lactate (AMLACTIN) 12 % cream Apply topically as needed for dry skin. 385 g 0   ammonium lactate (LAC-HYDRIN) 12 % lotion Apply topically.     aspirin EC 81 MG tablet Take 1 po BID x 6 weeks then resume 1 po daily 30 tablet 11   atenolol (TENORMIN) 50 MG tablet TAKE 1 TABLET BY MOUTH DAILY 30 tablet 10   bisacodyl (DULCOLAX) 10 MG suppository Place 1 suppository (10 mg total) rectally daily as needed for moderate constipation. 12 suppository 0    Calcium Carbonate-Vitamin D3 600-400 MG-UNIT TABS Take 1 tablet by mouth daily.     cephALEXin (KEFLEX) 500 MG capsule Take 1 capsule (500 mg total) by mouth 3 (three) times daily. 21 capsule 0   diclofenac sodium (VOLTAREN) 1 % GEL Apply 2 g topically 4 (four) times daily. Rub into affected area of foot 2 to 4 times daily 100 g 2   DULoxetine (CYMBALTA) 30 MG capsule TAKE 1 CAPSULE BY MOUTH DAILY ALONG WITH 60 MG FOR TOTAL DAILY DOSE OF 90 MG 20 capsule 10   DULoxetine (CYMBALTA) 60 MG capsule TAKE 1 CAPSULE BY MOUTH DAILY ALONG WITH 30 MG FOR TOTAL DAILY DOSE OF 90 MG 20 capsule 10   fluticasone (FLONASE) 50 MCG/ACT nasal spray Place 1 spray into both nostrils daily.     furosemide (LASIX) 40 MG tablet Take 1 tablet (40 mg total) by mouth daily. 30 tablet 3   gabapentin (NEURONTIN) 300 MG capsule Take 1 capsule by mouth 3 (three) times daily.     gabapentin (NEURONTIN) 800 MG tablet TAKE 1 TABLET BY MOUTH 3 TIMES DAILY 90 tablet 10   ketoconazole (NIZORAL) 2 % cream APPLY TOPICALLY DAILY *FOLLOW UP WITH ADAM MCDONALD [phone]* 60 g 10   MUCINEX 600 MG 12 hr tablet Take 600 mg by mouth 2 (two) times daily.     mupirocin ointment (BACTROBAN) 2 % APPLY TOPICALLY TO WOUND TWICE DAILY *FOLLOW UP WITH ADAM MCDONALD [phone]* 30 g 10   nitrofurantoin, macrocrystal-monohydrate, (MACROBID) 100 MG capsule Take 1 capsule (100 mg  total) by mouth 2 (two) times daily. 14 capsule 0   oxyCODONE (OXY IR/ROXICODONE) 5 MG immediate release tablet Take by mouth.     pantoprazole (PROTONIX) 40 MG tablet TAKE 1 TABLET BY MOUTH ONCE DAILY 30 tablet 10   rosuvastatin (CRESTOR) 40 MG tablet TAKE 1 TABLET BY MOUTH EACH EVENING 30 tablet 10   senna-docusate (SENOKOT-S) 8.6-50 MG tablet Take 1 tablet by mouth 2 (two) times daily.     No current facility-administered medications for this visit.     Past Medical History:  Diagnosis Date   [name] (abdominal aortic aneurysm) ([place]) 09/2013   3.6 cm   Acute and  chronic respiratory failure with hypoxia (HCC)    Acute delirium    Acute encephalopathy    Acute pyelonephritis    Atrial fibrillation (HCC)    Chronic pain syndrome    Constipation    Dementia (HCC)    Dependent edema    bilateral legs    Depression    Diabetes mellitus    pre-diabetes   Diverticulosis    Diverticulosis    Dysuria    E coli bacteremia    Fall    Gross hematuria    Hydronephrosis with renal and ureteral calculous obstruction    Hyperlipidemia    Hypertension    Hypoxemia    Idiopathic peripheral autonomic neuropathy    Infestation by bed bug    Nephrolithiasis    OSA on CPAP    Osteoarthritis    Osteopenia    T=  -2.1 in hip   Persistent mood (affective) disorder, unspecified (HCC)    Prediabetes    Rheumatoid arthritis (HCC)    Rheumatoid arthritis(714.0)    S/P carpal tunnel release    Sepsis secondary to UTI [facility])    Spinal stenosis    Unspecified fracture of fifth lumbar vertebra, initial encounter for closed fracture ([place])    Unspecified fracture of t11-T12 vertebra, initial encounter for closed fracture ([place])    Urinary incontinence    Urinary tract infection    hx of    Urolithiasis     Past Surgical History:  Procedure Laterality Date   APPENDECTOMY     CARPAL TUNNEL RELEASE  12/27/2006   Right subcutaneous ulnar nerve transfer -- Right open carpal tunnel release   CHOLECYSTECTOMY     CYSTOSCOPY W/ URETERAL STENT PLACEMENT Right 01/30/2018   Procedure: CYSTOSCOPY WITH STENT REPLACEMENT retrograde pylegram;  Surgeon: Ardis Hughs, MD;  [facility];  Service: Urology;  Laterality: Right;   CYSTOSCOPY WITH URETEROSCOPY AND STENT PLACEMENT Right 03/01/2018   Procedure: CYSTOSCPY/RIGHT URETEROSCOPY HOLMIUM LASER LITHOTRIPSY AND STENT EXCHANGE;  Surgeon: Ardis Hughs, MD;  [facility];  Service: Urology;  Laterality: Right;   FOOT SURGERY     HIP ARTHROPLASTY Left 08/27/2020   Procedure: ARTHROPLASTY BIPOLAR HIP  (HEMIARTHROPLASTY);  Surgeon: Mordecai Rasmussen, MD;  Location: [facility];  Service: Orthopedics;  Laterality: Left;   HOLMIUM LASER APPLICATION Right 41/66/0630   Procedure: HOLMIUM LASER APPLICATION;  Surgeon: Ardis Hughs, MD;  [facility];  Service: Urology;  Laterality: Right;   JOINT REPLACEMENT     BTKR, RTHR   TOTAL KNEE ARTHROPLASTY  08/21/2007   Right total knee replacement    Social History   Socioeconomic History   Marital status: Divorced    Spouse name: Not on file   Number of children: 6   Years of education: 8th grade   Highest education level: Not on  file  Occupational History   Occupation: Retired  Tobacco Use   Smoking status: Never   Smokeless tobacco: Never  Vaping Use   Vaping Use: Never used  Substance and Sexual Activity   Alcohol use: No   Drug use: No   Sexual activity: Not on file  Other Topics Concern   Not on file  Social History Narrative   Lives at home with 2 sons   She has four living children.   Right-handed.   Two cups caffeine per day.   Social Determinants of Health   Financial Resource Strain: Low Risk    Difficulty of Paying Living Expenses: Not hard at all  Food Insecurity: No Food Insecurity   Worried About Charity fundraiser in the Last Year: Never true   [place] in the Last Year: Never true  Transportation Needs: No Transportation Needs   Lack of Transportation (Medical): No   Lack of Transportation (Non-Medical): No  Physical Activity: Inactive   Days of Exercise per Week: 0 days   Minutes of Exercise per Session: 0 min  Stress: No Stress Concern Present   Feeling of Stress : Not at all  Social Connections: Socially Isolated   Frequency of Communication with Friends and Family: Once a week   Frequency of Social Gatherings with Friends and Family: More than three times a week   Attends Religious Services: Never   Marine scientist or Organizations: No   Attends Music therapist: Never    Marital Status: Divorced  Human resources officer Violence: Not At Risk   Fear of Current or Ex-Partner: No   Emotionally Abused: No   Physically Abused: No   Sexually Abused: No    Family History  Problem Relation Age of Onset   Stroke Father    Coronary artery disease Son        CABG at age 39   Heart disease Son    Heart attack Son    Stroke Mother    Hypertension Sister    Diabetes Brother    Hypertension Maternal Aunt     ROS: no fevers or chills, productive cough, hemoptysis, dysphasia, odynophagia, melena, hematochezia, dysuria, hematuria, rash, seizure activity, orthopnea, PND, pedal edema, claudication. Remaining systems are negative.  Physical Exam: Well-developed well-nourished in no acute distress.  Skin is warm and dry.  HEENT is normal.  Neck is supple.  Chest is clear to auscultation with normal expansion.  Cardiovascular exam is regular rate and rhythm.  Abdominal exam nontender or distended. No masses palpated. Extremities show no edema. neuro grossly intact  ECG- personally reviewed  A/P  1 permanent atrial fibrillation-continue beta-blocker for rate control.  As outlined in previous notes she has had multiple falls and therefore is not a good candidate for anticoagulation.  2 previous abnormal nuclear study-possibly secondary to shifting breast attenuation and not felt to be high risk.  Continue medical therapy.  She denies chest pain.  3 abdominal aortic aneurysm-she will need follow-up ultrasound December 2022.  4 hypertension-patient's blood pressure is controlled.  Continue present medical regimen.  5 hyperlipidemia-continue statin.  6 chronic diastolic/right-sided congestive heart failure-continue Lasix as needed.  Kirk Ruths, MD

## 2021-01-21 ENCOUNTER — Ambulatory Visit: Payer: 59 | Admitting: Cardiology

## 2021-01-22 ENCOUNTER — Telehealth: Payer: 59

## 2021-01-28 ENCOUNTER — Encounter: Payer: Self-pay | Admitting: Podiatry

## 2021-01-28 ENCOUNTER — Ambulatory Visit (INDEPENDENT_AMBULATORY_CARE_PROVIDER_SITE_OTHER): Payer: 59 | Admitting: Podiatry

## 2021-01-28 ENCOUNTER — Other Ambulatory Visit: Payer: Self-pay

## 2021-01-28 DIAGNOSIS — E1142 Type 2 diabetes mellitus with diabetic polyneuropathy: Secondary | ICD-10-CM | POA: Diagnosis not present

## 2021-01-28 DIAGNOSIS — B351 Tinea unguium: Secondary | ICD-10-CM

## 2021-01-28 DIAGNOSIS — M79676 Pain in unspecified toe(s): Secondary | ICD-10-CM

## 2021-01-29 NOTE — Progress Notes (Signed)
She presents today with her son chief complaint of painfully elongated toenails bilaterally.  Objective: Pulses are palpable but barely capillary fill time is sluggish.  History of known peripheral vascular disease.  Toenails are long thick yellow dystrophic onychomycotic.  Assessment: Pain limb secondary to onychomycosis peripheral vascular disease diabetic peripheral neuropathy.  Plan: Debridement of toenails 1 through 5 bilateral.

## 2021-02-13 ENCOUNTER — Inpatient Hospital Stay (HOSPITAL_COMMUNITY)
Admission: EM | Admit: 2021-02-13 | Discharge: 2021-02-17 | DRG: 682 | Disposition: A | Discharge status: Home Health | Payer: 59 | Attending: Internal Medicine | Admitting: Internal Medicine

## 2021-02-13 ENCOUNTER — Emergency Department (HOSPITAL_COMMUNITY): Payer: 59

## 2021-02-13 ENCOUNTER — Inpatient Hospital Stay (HOSPITAL_COMMUNITY): Payer: 59

## 2021-02-13 ENCOUNTER — Encounter (HOSPITAL_COMMUNITY): Payer: Self-pay | Admitting: Emergency Medicine

## 2021-02-13 ENCOUNTER — Other Ambulatory Visit: Payer: Self-pay

## 2021-02-13 DIAGNOSIS — B9620 Unspecified Escherichia coli [E. coli] as the cause of diseases classified elsewhere: Secondary | ICD-10-CM | POA: Diagnosis present

## 2021-02-13 DIAGNOSIS — E873 Alkalosis: Secondary | ICD-10-CM | POA: Diagnosis present

## 2021-02-13 DIAGNOSIS — I714 Abdominal aortic aneurysm, without rupture, unspecified: Secondary | ICD-10-CM | POA: Diagnosis present

## 2021-02-13 DIAGNOSIS — N179 Acute kidney failure, unspecified: Secondary | ICD-10-CM | POA: Diagnosis present

## 2021-02-13 DIAGNOSIS — Z20822 Contact with and (suspected) exposure to covid-19: Secondary | ICD-10-CM | POA: Diagnosis present

## 2021-02-13 DIAGNOSIS — N390 Urinary tract infection, site not specified: Secondary | ICD-10-CM | POA: Diagnosis present

## 2021-02-13 DIAGNOSIS — E874 Mixed disorder of acid-base balance: Secondary | ICD-10-CM | POA: Diagnosis present

## 2021-02-13 DIAGNOSIS — Z87442 Personal history of urinary calculi: Secondary | ICD-10-CM

## 2021-02-13 DIAGNOSIS — F32A Depression, unspecified: Secondary | ICD-10-CM | POA: Diagnosis present

## 2021-02-13 DIAGNOSIS — E876 Hypokalemia: Secondary | ICD-10-CM | POA: Diagnosis present

## 2021-02-13 DIAGNOSIS — I10 Essential (primary) hypertension: Secondary | ICD-10-CM | POA: Diagnosis present

## 2021-02-13 DIAGNOSIS — K219 Gastro-esophageal reflux disease without esophagitis: Secondary | ICD-10-CM | POA: Diagnosis present

## 2021-02-13 DIAGNOSIS — F0394 Unspecified dementia, unspecified severity, with anxiety: Secondary | ICD-10-CM | POA: Diagnosis present

## 2021-02-13 DIAGNOSIS — E119 Type 2 diabetes mellitus without complications: Secondary | ICD-10-CM | POA: Diagnosis present

## 2021-02-13 DIAGNOSIS — Z1612 Extended spectrum beta lactamase (ESBL) resistance: Secondary | ICD-10-CM | POA: Diagnosis present

## 2021-02-13 DIAGNOSIS — Z8673 Personal history of transient ischemic attack (TIA), and cerebral infarction without residual deficits: Secondary | ICD-10-CM

## 2021-02-13 DIAGNOSIS — R531 Weakness: Secondary | ICD-10-CM

## 2021-02-13 DIAGNOSIS — Z66 Do not resuscitate: Secondary | ICD-10-CM | POA: Diagnosis present

## 2021-02-13 DIAGNOSIS — J9611 Chronic respiratory failure with hypoxia: Secondary | ICD-10-CM | POA: Diagnosis present

## 2021-02-13 DIAGNOSIS — G9341 Metabolic encephalopathy: Secondary | ICD-10-CM

## 2021-02-13 DIAGNOSIS — E785 Hyperlipidemia, unspecified: Secondary | ICD-10-CM | POA: Diagnosis present

## 2021-02-13 DIAGNOSIS — G9009 Other idiopathic peripheral autonomic neuropathy: Secondary | ICD-10-CM | POA: Diagnosis present

## 2021-02-13 DIAGNOSIS — E860 Dehydration: Secondary | ICD-10-CM | POA: Diagnosis present

## 2021-02-13 DIAGNOSIS — E872 Acidosis, unspecified: Secondary | ICD-10-CM | POA: Diagnosis not present

## 2021-02-13 DIAGNOSIS — Z79899 Other long term (current) drug therapy: Secondary | ICD-10-CM

## 2021-02-13 DIAGNOSIS — N1831 Chronic kidney disease, stage 3a: Secondary | ICD-10-CM | POA: Diagnosis present

## 2021-02-13 DIAGNOSIS — I4891 Unspecified atrial fibrillation: Secondary | ICD-10-CM | POA: Diagnosis present

## 2021-02-13 DIAGNOSIS — Z87440 Personal history of urinary (tract) infections: Secondary | ICD-10-CM

## 2021-02-13 DIAGNOSIS — Z882 Allergy status to sulfonamides status: Secondary | ICD-10-CM

## 2021-02-13 DIAGNOSIS — M1990 Unspecified osteoarthritis, unspecified site: Secondary | ICD-10-CM | POA: Diagnosis present

## 2021-02-13 DIAGNOSIS — Z833 Family history of diabetes mellitus: Secondary | ICD-10-CM

## 2021-02-13 DIAGNOSIS — Z823 Family history of stroke: Secondary | ICD-10-CM

## 2021-02-13 DIAGNOSIS — G894 Chronic pain syndrome: Secondary | ICD-10-CM | POA: Diagnosis present

## 2021-02-13 DIAGNOSIS — M8580 Other specified disorders of bone density and structure, unspecified site: Secondary | ICD-10-CM | POA: Diagnosis present

## 2021-02-13 DIAGNOSIS — Z7982 Long term (current) use of aspirin: Secondary | ICD-10-CM

## 2021-02-13 DIAGNOSIS — M069 Rheumatoid arthritis, unspecified: Secondary | ICD-10-CM | POA: Diagnosis present

## 2021-02-13 DIAGNOSIS — G4733 Obstructive sleep apnea (adult) (pediatric): Secondary | ICD-10-CM | POA: Diagnosis present

## 2021-02-13 DIAGNOSIS — Z8249 Family history of ischemic heart disease and other diseases of the circulatory system: Secondary | ICD-10-CM

## 2021-02-13 LAB — CBC WITH DIFFERENTIAL/PLATELET
Abs Immature Granulocytes: 0.02 10*3/uL (ref 0.00–0.07)
Basophils Absolute: 0.1 10*3/uL (ref 0.0–0.1)
Basophils Relative: 1 %
Eosinophils Absolute: 0.1 10*3/uL (ref 0.0–0.5)
Eosinophils Relative: 1 %
HCT: 46.5 % — ABNORMAL HIGH (ref 36.0–46.0)
Hemoglobin: 14.7 g/dL (ref 12.0–15.0)
Immature Granulocytes: 0 %
Lymphocytes Relative: 18 %
Lymphs Abs: 1.6 10*3/uL (ref 0.7–4.0)
MCH: 28.5 pg (ref 26.0–34.0)
MCHC: 31.6 g/dL (ref 30.0–36.0)
MCV: 90.1 fL (ref 80.0–100.0)
Monocytes Absolute: 0.6 10*3/uL (ref 0.1–1.0)
Monocytes Relative: 7 %
Neutro Abs: 6.6 10*3/uL (ref 1.7–7.7)
Neutrophils Relative %: 73 %
Platelets: 217 10*3/uL (ref 150–400)
RBC: 5.16 MIL/uL — ABNORMAL HIGH (ref 3.87–5.11)
RDW: 14.3 % (ref 11.5–15.5)
WBC: 9 10*3/uL (ref 4.0–10.5)
nRBC: 0 % (ref 0.0–0.2)

## 2021-02-13 LAB — URINALYSIS, ROUTINE W REFLEX MICROSCOPIC
Bilirubin Urine: NEGATIVE
Glucose, UA: NEGATIVE mg/dL
Ketones, ur: NEGATIVE mg/dL
Nitrite: NEGATIVE
Protein, ur: NEGATIVE mg/dL
Specific Gravity, Urine: 1.015 (ref 1.005–1.030)
pH: 7 (ref 5.0–8.0)

## 2021-02-13 LAB — COMPREHENSIVE METABOLIC PANEL
ALT: 18 U/L (ref 0–44)
AST: 34 U/L (ref 15–41)
Albumin: 3.9 g/dL (ref 3.5–5.0)
Alkaline Phosphatase: 69 U/L (ref 38–126)
Anion gap: 15 (ref 5–15)
BUN: 24 mg/dL — ABNORMAL HIGH (ref 8–23)
CO2: 33 mmol/L — ABNORMAL HIGH (ref 22–32)
Calcium: 8.8 mg/dL — ABNORMAL LOW (ref 8.9–10.3)
Chloride: 87 mmol/L — ABNORMAL LOW (ref 98–111)
Creatinine, Ser: 1.5 mg/dL — ABNORMAL HIGH (ref 0.44–1.00)
GFR, Estimated: 34 mL/min — ABNORMAL LOW (ref >60)
Glucose, Bld: 161 mg/dL — ABNORMAL HIGH (ref 70–99)
Potassium: 2.8 mmol/L — ABNORMAL LOW (ref 3.5–5.1)
Sodium: 135 mmol/L (ref 135–145)
Total Bilirubin: 1.6 mg/dL — ABNORMAL HIGH (ref 0.3–1.2)
Total Protein: 7.9 g/dL (ref 6.5–8.1)

## 2021-02-13 LAB — LACTIC ACID, PLASMA
Lactic Acid, Venous: 2.3 mmol/L (ref 0.5–1.9)
Lactic Acid, Venous: 2.2 mmol/L (ref 0.5–1.9)
Lactic Acid, Venous: 2.3 mmol/L (ref 0.5–1.9)

## 2021-02-13 LAB — AMMONIA: Ammonia: <10 umol/L (ref 9–35)

## 2021-02-13 LAB — BLOOD GAS, ARTERIAL
Acid-Base Excess: 9.7 mmol/L — ABNORMAL HIGH (ref 0.0–2.0)
Bicarbonate: 33.8 mmol/L — ABNORMAL HIGH (ref 20.0–28.0)
FIO2: 32
O2 Saturation: 98.9 %
Patient temperature: 36.1
pCO2 arterial: 35 mmHg (ref 32.0–48.0)
pH, Arterial: 7.575 — ABNORMAL HIGH (ref 7.350–7.450)
pO2, Arterial: 133 mmHg — ABNORMAL HIGH (ref 83.0–108.0)

## 2021-02-13 LAB — PROTIME-INR
INR: 1.1 (ref 0.8–1.2)
Prothrombin Time: 14.2 seconds (ref 11.4–15.2)

## 2021-02-13 LAB — RESP PANEL BY RT-PCR (FLU A&B, COVID) ARPGX2
Influenza A by PCR: NEGATIVE
Influenza B by PCR: NEGATIVE
SARS Coronavirus 2 by RT PCR: NEGATIVE

## 2021-02-13 LAB — APTT: aPTT: 26 seconds (ref 24–36)

## 2021-02-13 LAB — POC OCCULT BLOOD, ED: Fecal Occult Bld: NEGATIVE

## 2021-02-13 LAB — PROCALCITONIN: Procalcitonin: 0.14 ng/mL

## 2021-02-13 MED ORDER — POTASSIUM CHLORIDE 10 MEQ/100ML IV SOLN
10.0000 meq | INTRAVENOUS | Status: AC
  Administered 2021-02-13: 10 meq via INTRAVENOUS
  Filled 2021-02-13 (×3): qty 100

## 2021-02-13 MED ORDER — LACTATED RINGERS IV BOLUS (SEPSIS)
500.0000 mL | Freq: Once | INTRAVENOUS | Status: AC
  Administered 2021-02-13: 500 mL via INTRAVENOUS

## 2021-02-13 MED ORDER — SODIUM CHLORIDE 0.9 % IV SOLN
1.0000 g | INTRAVENOUS | Status: DC
  Administered 2021-02-13: 1 g via INTRAVENOUS
  Filled 2021-02-13: qty 10

## 2021-02-13 MED ORDER — SODIUM CHLORIDE 0.9 % IV BOLUS
500.0000 mL | Freq: Once | INTRAVENOUS | Status: AC
  Administered 2021-02-13: 500 mL via INTRAVENOUS

## 2021-02-13 MED ORDER — POTASSIUM CHLORIDE 10 MEQ/100ML IV SOLN
10.0000 meq | INTRAVENOUS | Status: AC
  Administered 2021-02-13 – 2021-02-14 (×4): 10 meq via INTRAVENOUS
  Filled 2021-02-13 (×2): qty 100

## 2021-02-13 MED ORDER — LACTATED RINGERS IV BOLUS
1000.0000 mL | Freq: Once | INTRAVENOUS | Status: AC
  Administered 2021-02-13: 1000 mL via INTRAVENOUS

## 2021-02-13 MED ORDER — IOHEXOL 300 MG/ML  SOLN
80.0000 mL | Freq: Once | INTRAMUSCULAR | Status: AC | PRN
  Administered 2021-02-13: 80 mL via INTRAVENOUS

## 2021-02-13 MED ORDER — SODIUM CHLORIDE 0.9 % IV SOLN: INTRAVENOUS | Status: DC

## 2021-02-13 NOTE — ED Notes (Signed)
Date and time results received: 02/13/21 1738 (use smartphrase ".now" to insert current time)  Test: lactic Critical Value: 2.3  Name of Provider Notified: Kenton Kingfisher, [place]  Orders Received? Or Actions Taken?: see chart

## 2021-02-13 NOTE — ED Triage Notes (Signed)
Per [facility] family reports "black stools" x 2 days, 0/10 pain, pt c/o weakness, v/s WNL

## 2021-02-13 NOTE — ED Notes (Signed)
Patient transported to CT 

## 2021-02-13 NOTE — H&P (Signed)
TRH H&P    Patient Demographics:    [name], is a 87 y.o. female  MRN: [id number]  DOB - [birth year]  Admit Date - 02/13/2021  Referring MD/NP/PA: Vernie Shanks  Outpatient Primary MD for the patient is [name], MD  Patient coming from: Home  Chief complaint- altered mental status   HPI:    [name]  is a 87 y.o. female, with history of abdominal aortic aneurysm, chronic hypoxic respiratory failure, atrial fibrillation, dementia, depression, diabetes mellitus, hyperlipidemia, hypertension, and more presents the ED with a chief complaint of fatigue, somnolence, and weakness.  Patient has a history of dementia, lives with her 2 sons.  Daughter is at bedside.  History somewhat limited as patient is not able to give her own history, and daughter only notes with the sons have told her.  Daughter did go to see patient today and noted that she was dizzy upon standing when trying to ambulate with her walker to the bathroom.  Other than that she knows that the sons told her that the patient has had decreased appetite for the last 2 or 3 days.  Daughter did witness patient try to eat a bowl of cereal, she took about 1 or 2 spoonfuls and was done.  She has not had any change in her urine output, color, odor, frequency.  She has not complained of dysuria.  When she gets UTIs she usually is confused with hallucinations, which is much different kind of altered mental status.  To the daughter's knowledge patient has not been coughing any more than normal.  She has been wearing her baseline O2 as needed.  She has not had any fevers.  She has complained of some abdominal pain.  No further history could be obtained at this time.  Patient reports that right now she is comfortable except for that she would like something to eat.  Patient does not smoke, does not drink.  Patient is DNR.  In the ED Temp 97, heart rate 70-91,  respiratory rate 11-19, blood pressure 83/66 -115/97 maintaining her oxygen sats at her baseline oxygen requirement No leukocytosis with white blood cell count 9.0, hemoglobin 14.7 Chemistry panel reveals a hypokalemia 2.8, and increased bicarb at 33, hypochloremia 87, elevated creatinine at 1.5, glucose of 161 Chest x-ray shows no active disease EKG shows a heart rate of 88, A. fib, QTC 447 CT abdomen pelvis shows an aortic aneurysm 4.3 cm, increased from 4.0 cm 3 years ago.  Hepatic steatosis, coronary artery disease, diverticulosis without diverticulitis and no acute findings. VBG shows a pH of 7.575, bicarb 35 Sounds are worried about possible GI bleed is a reported some dark stools at home.  FOBT negative here with brown stool. Admission requested for further treatment of AKI and altered mental status    Review of systems:    Full review of systems cannot be obtained secondary to patient's dementia    Past History of the following :    Past Medical History:  Diagnosis Date   [name] (abdominal aortic aneurysm) 09/2013  3.6 cm   Acute and chronic respiratory failure with hypoxia (HCC)    Acute delirium    Acute encephalopathy    Acute pyelonephritis    Atrial fibrillation (HCC)    Chronic pain syndrome    Constipation    Dementia (HCC)    Dependent edema    bilateral legs    Depression    Diabetes mellitus    pre-diabetes   Diverticulosis    Diverticulosis    Dysuria    E coli bacteremia    Fall    Gross hematuria    Hydronephrosis with renal and ureteral calculous obstruction    Hyperlipidemia    Hypertension    Hypoxemia    Idiopathic peripheral autonomic neuropathy    Infestation by bed bug    Nephrolithiasis    OSA on CPAP    Osteoarthritis    Osteopenia    T=  -2.1 in hip   Persistent mood (affective) disorder, unspecified (HCC)    Prediabetes    Rheumatoid arthritis (HCC)    Rheumatoid arthritis(714.0)    S/P carpal tunnel release    Sepsis secondary to  UTI [facility])    Spinal stenosis    Unspecified fracture of fifth lumbar vertebra, initial encounter for closed fracture ([place])    Unspecified fracture of t11-T12 vertebra, initial encounter for closed fracture ([place])    Urinary incontinence    Urinary tract infection    hx of    Urolithiasis       Past Surgical History:  Procedure Laterality Date   APPENDECTOMY     CARPAL TUNNEL RELEASE  12/27/2006   Right subcutaneous ulnar nerve transfer -- Right open carpal tunnel release   CHOLECYSTECTOMY     CYSTOSCOPY W/ URETERAL STENT PLACEMENT Right 01/30/2018   Procedure: CYSTOSCOPY WITH STENT REPLACEMENT retrograde pylegram;  Surgeon: Ardis Hughs, MD;  Location: [facility];  Service: Urology;  Laterality: Right;   CYSTOSCOPY WITH URETEROSCOPY AND STENT PLACEMENT Right 03/01/2018   Procedure: CYSTOSCPY/RIGHT URETEROSCOPY HOLMIUM LASER LITHOTRIPSY AND STENT EXCHANGE;  Surgeon: Ardis Hughs, MD;  Location: [facility];  Service: Urology;  Laterality: Right;   FOOT SURGERY     HIP ARTHROPLASTY Left 08/27/2020   Procedure: ARTHROPLASTY BIPOLAR HIP (HEMIARTHROPLASTY);  Surgeon: Mordecai Rasmussen, MD;  Location: [facility];  Service: Orthopedics;  Laterality: Left;   HOLMIUM LASER APPLICATION Right 25/42/7062   Procedure: HOLMIUM LASER APPLICATION;  Surgeon: Ardis Hughs, MD;  Location: [facility];  Service: Urology;  Laterality: Right;   JOINT REPLACEMENT     BTKR, RTHR   TOTAL KNEE ARTHROPLASTY  08/21/2007   Right total knee replacement      Social History:      Social History   Tobacco Use   Smoking status: Never   Smokeless tobacco: Never  Substance Use Topics   Alcohol use: No       Family History :     Family History  Problem Relation Age of Onset   Stroke Father    Coronary artery disease Son        CABG at age 80   Heart disease Son    Heart attack Son    Stroke Mother    Hypertension Sister    Diabetes Brother    Hypertension Maternal Aunt       Home Medications:    Prior to Admission medications   Medication Sig Start Date End Date Taking? Authorizing Provider  acetaminophen (TYLENOL) 325 MG tablet Take 2 tablets (  650 mg total) by mouth every 6 (six) hours. 09/01/20  Yes Johnson, Clanford L, MD  albuterol (VENTOLIN HFA) 108 (90 Base) MCG/ACT inhaler Inhale 2 puffs into the lungs every 4 (four) hours as needed for wheezing or shortness of breath. 09/02/20  Yes [provider]  ALPRAZolam (XANAX) 0.25 MG tablet Take 1 tablet (0.25 mg total) by mouth 2 (two) times daily as needed for anxiety. 09/01/20  Yes Johnson, Clanford L, MD  ammonium lactate (AMLACTIN) 12 % cream Apply topically as needed for dry skin. Patient taking differently: Apply 1 application topically as needed for dry skin. 08/17/18  Yes Trula Slade, DPM  aspirin EC 81 MG tablet Take 1 po BID x 6 weeks then resume 1 po daily Patient taking differently: Take 81 mg by mouth daily. 09/01/20  Yes Johnson, Clanford L, MD  atenolol (TENORMIN) 50 MG tablet TAKE 1 TABLET BY MOUTH DAILY 09/17/20  Yes [name], MD  Calcium Carbonate-Vitamin D3 600-400 MG-UNIT TABS Take 1 tablet by mouth daily. 02/04/20  Yes [provider]  diclofenac sodium (VOLTAREN) 1 % GEL Apply 2 g topically 4 (four) times daily. Rub into affected area of foot 2 to 4 times daily 08/17/18  Yes Trula Slade, DPM  DULoxetine (CYMBALTA) 30 MG capsule TAKE 1 CAPSULE BY MOUTH DAILY ALONG WITH 60 MG FOR TOTAL DAILY DOSE OF 90 MG Patient taking differently: Take 30 mg by mouth daily. 09/17/20  Yes [name], MD  DULoxetine (CYMBALTA) 60 MG capsule TAKE 1 CAPSULE BY MOUTH DAILY ALONG WITH 30 MG FOR TOTAL DAILY DOSE OF 90 MG Patient taking differently: Take 60 mg by mouth See admin instructions. Take 60 mg daily with 30 mg capsule for a total of 90 mg daily 09/17/20  Yes Pickard, [name], MD  furosemide (LASIX) 40 MG tablet Take 1 tablet (40 mg total) by mouth daily. 12/29/20  Yes [name], MD   gabapentin (NEURONTIN) 800 MG tablet TAKE 1 TABLET BY MOUTH 3 TIMES DAILY 01/13/21  Yes [name], MD  ketoconazole (NIZORAL) 2 % cream APPLY TOPICALLY DAILY *FOLLOW UP WITH ADAM MCDONALD [phone]* Patient taking differently: Apply 1 application topically daily. 11/12/20  Yes Evelina Bucy, DPM  oxyCODONE (OXY IR/ROXICODONE) 5 MG immediate release tablet Take 5 mg by mouth every 6 (six) hours as needed for moderate pain. 09/07/20  Yes [provider]  pantoprazole (PROTONIX) 40 MG tablet TAKE 1 TABLET BY MOUTH ONCE DAILY Patient taking differently: Take 40 mg by mouth daily. 12/10/20  Yes [name], MD  polyethylene glycol (MIRALAX / GLYCOLAX) 17 g packet Take 17 g by mouth daily as needed for mild constipation or moderate constipation.   Yes [provider]  rosuvastatin (CRESTOR) 40 MG tablet TAKE 1 TABLET BY MOUTH EACH EVENING Patient taking differently: Take 40 mg by mouth daily. 08/20/20  Yes [name], MD  ammonium lactate (LAC-HYDRIN) 12 % lotion Apply topically. Patient not taking: Reported on 02/13/2021 09/01/20   [provider]  bisacodyl (DULCOLAX) 10 MG suppository Place 1 suppository (10 mg total) rectally daily as needed for moderate constipation. Patient not taking: No sig reported 09/01/20   Irwin Brakeman L, MD  cephALEXin (KEFLEX) 500 MG capsule Take 1 capsule (500 mg total) by mouth 3 (three) times daily. Patient not taking: No sig reported 12/29/20   [name], MD  fluticasone [facility]) 50 MCG/ACT nasal spray Place 1 spray into both nostrils daily. Patient  not taking: No sig reported 09/02/20   [provider]  gabapentin (NEURONTIN) 300 MG capsule Take 1 capsule by mouth 3 (three) times daily. Patient not taking: No sig reported 09/09/20   [provider]  MUCINEX 600 MG 12 hr tablet Take 600 mg by mouth 2 (two) times daily. Patient not taking: No sig reported 09/02/20   [provider]   mupirocin ointment (BACTROBAN) 2 % APPLY TOPICALLY TO WOUND TWICE DAILY *FOLLOW UP WITH ADAM MCDONALD [phone]* Patient not taking: No sig reported 11/12/20   Evelina Bucy, DPM  nitrofurantoin, macrocrystal-monohydrate, (MACROBID) 100 MG capsule Take 1 capsule (100 mg total) by mouth 2 (two) times daily. Patient not taking: No sig reported 01/01/21   [name], MD  senna-docusate (SENOKOT-S) 8.6-50 MG tablet Take 1 tablet by mouth 2 (two) times daily. Patient not taking: No sig reported 09/01/20   Murlean Iba, MD     Allergies:     Allergies  Allergen Reactions   Sulfonamide Derivatives Hives and Rash     Physical Exam:   Vitals  Blood pressure (!) 115/97, pulse 78, temperature (!) 97 F (36.1 C), temperature source Rectal, resp. rate 11, height 5\' 5"  (1.651 m), weight 74.4 kg, SpO2 99 %.  1.  General: Patient lying supine in bed,  no acute distress   2. Psychiatric: Alert and oriented x 2, mood and behavior normal for situation, pleasant and cooperative with exam   3. Neurologic: Speech and language are normal, face is symmetric, moves all 4 extremities voluntarily, at baseline without acute deficits on limited exam   4. HEENMT:  Head is atraumatic, normocephalic, pupils reactive to light, neck is supple, trachea is midline, mucous membranes are moist   5. Respiratory : Lungs are clear to auscultation bilaterally without wheezing, rhonchi, rales, no cyanosis, no increase in work of breathing or accessory muscle use   6. Cardiovascular : Heart rate normal, irregularly irregular, no murmurs, rubs or gallops, no peripheral edema, peripheral pulses palpated   7. Gastrointestinal:  Abdomen is soft, nondistended, mildly tender to palpation in the left upper quadrant, bowel sounds active, no masses or organomegaly palpated   8. Skin:  Skin is warm, dry and intact without rashes, acute lesions, or ulcers on limited exam   9.Musculoskeletal:  No acute  deformities or trauma, no asymmetry in tone, no peripheral edema, peripheral pulses palpated, no tenderness to palpation in the extremities     Data Review:    CBC Recent Labs  Lab 02/13/21 1641  WBC 9.0  HGB 14.7  HCT 46.5*  PLT 217  MCV 90.1  MCH 28.5  MCHC 31.6  RDW 14.3  LYMPHSABS 1.6  MONOABS 0.6  EOSABS 0.1  BASOSABS 0.1   ------------------------------------------------------------------------------------------------------------------  Results for orders placed or performed during the hospital encounter of 02/13/21 (from the past 48 hour(s))  POC occult blood, ED     Status: None   Collection Time: 02/13/21  4:39 PM  Result Value Ref Range   Fecal Occult Bld NEGATIVE NEGATIVE  Lactic acid, plasma     Status: Abnormal   Collection Time: 02/13/21  4:41 PM  Result Value Ref Range   Lactic Acid, Venous 2.3 (HH) 0.5 - 1.9 mmol/L    Comment: CRITICAL RESULT CALLED TO, READ BACK BY AND VERIFIED WITH: DOSS,M @ 1737 ON 02/13/21 BY JUW Performed at [facility], [address] [place], [place] [address]   Comprehensive metabolic panel     Status: Abnormal  Collection Time: 02/13/21  4:41 PM  Result Value Ref Range   Sodium 135 135 - 145 mmol/L   Potassium 2.8 (L) 3.5 - 5.1 mmol/L   Chloride 87 (L) 98 - 111 mmol/L   CO2 33 (H) 22 - 32 mmol/L   Glucose, Bld 161 (H) 70 - 99 mg/dL    Comment: Glucose reference range applies only to samples taken after fasting for at least 8 hours.   BUN 24 (H) 8 - 23 mg/dL   Creatinine, Ser 1.50 (H) 0.44 - 1.00 mg/dL   Calcium 8.8 (L) 8.9 - 10.3 mg/dL   Total Protein 7.9 6.5 - 8.1 g/dL   Albumin 3.9 3.5 - 5.0 g/dL   AST 34 15 - 41 U/L   ALT 18 0 - 44 U/L   Alkaline Phosphatase 69 38 - 126 U/L   Total Bilirubin 1.6 (H) 0.3 - 1.2 mg/dL   GFR, Estimated 34 (L) >60 mL/min    Comment: (NOTE) Calculated using the CKD-EPI Creatinine Equation (2021)    Anion gap 15 5 - 15    Comment: Performed at [facility], [address]  [place], [place] [address]  CBC WITH DIFFERENTIAL     Status: Abnormal   Collection Time: 02/13/21  4:41 PM  Result Value Ref Range   WBC 9.0 4.0 - 10.5 K/uL   RBC 5.16 (H) 3.87 - 5.11 MIL/uL   Hemoglobin 14.7 12.0 - 15.0 g/dL   HCT 46.5 (H) 36.0 - 46.0 %   MCV 90.1 80.0 - 100.0 fL   MCH 28.5 26.0 - 34.0 pg   MCHC 31.6 30.0 - 36.0 g/dL   RDW 14.3 11.5 - 15.5 %   Platelets 217 150 - 400 K/uL   nRBC 0.0 0.0 - 0.2 %   Neutrophils Relative % 73 %   Neutro Abs 6.6 1.7 - 7.7 K/uL   Lymphocytes Relative 18 %   Lymphs Abs 1.6 0.7 - 4.0 K/uL   Monocytes Relative 7 %   Monocytes Absolute 0.6 0.1 - 1.0 K/uL   Eosinophils Relative 1 %   Eosinophils Absolute 0.1 0.0 - 0.5 K/uL   Basophils Relative 1 %   Basophils Absolute 0.1 0.0 - 0.1 K/uL   Immature Granulocytes 0 %   Abs Immature Granulocytes 0.02 0.00 - 0.07 K/uL    Comment: Performed at [facility], [address], [place], [place] [address]  Protime-INR     Status: None   Collection Time: 02/13/21  4:41 PM  Result Value Ref Range   Prothrombin Time 14.2 11.4 - 15.2 seconds   INR 1.1 0.8 - 1.2    Comment: (NOTE) INR goal varies based on device and disease states. Performed at [facility], [address], [place], [place] [address]   APTT     Status: None   Collection Time: 02/13/21  4:41 PM  Result Value Ref Range   aPTT 26 24 - 36 seconds    Comment: Performed at [facility], [address] [place], [place] [address]  Procalcitonin - Baseline     Status: None   Collection Time: 02/13/21  4:41 PM  Result Value Ref Range   Procalcitonin 0.14 ng/mL    Comment:        Interpretation: PCT (Procalcitonin) <= 0.5 ng/mL: Systemic infection (sepsis) is not likely. Local bacterial infection is possible. (NOTE)       Sepsis PCT Algorithm           Lower Respiratory Tract  Infection PCT Algorithm    ----------------------------     ----------------------------         PCT < 0.25 ng/mL                 PCT < 0.10 ng/mL          Strongly encourage             Strongly discourage   discontinuation of antibiotics    initiation of antibiotics    ----------------------------     -----------------------------       PCT 0.25 - 0.50 ng/mL            PCT 0.10 - 0.25 ng/mL               OR       >80% decrease in PCT            Discourage initiation of                                            antibiotics      Encourage discontinuation           of antibiotics    ----------------------------     -----------------------------         PCT >= 0.50 ng/mL              PCT 0.26 - 0.50 ng/mL               AND        <80% decrease in PCT             Encourage initiation of                                             antibiotics       Encourage continuation           of antibiotics    ----------------------------     -----------------------------        PCT >= 0.50 ng/mL                  PCT > 0.50 ng/mL               AND         increase in PCT                  Strongly encourage                                      initiation of antibiotics    Strongly encourage escalation           of antibiotics                                     -----------------------------                                           PCT <= 0.25 ng/mL  OR                                        > 80% decrease in PCT                                      Discontinue / Do not initiate                                             antibiotics  Performed at [facility], [address] [place], [place] [address]   Blood gas, arterial     Status: Abnormal   Collection Time: 02/13/21  6:11 PM  Result Value Ref Range   FIO2 32.00    pH, Arterial 7.575 (H) 7.350 - 7.450   pCO2 arterial 35.0 32.0 - 48.0 mmHg   pO2, Arterial 133 (H) 83.0 - 108.0 mmHg   Bicarbonate 33.8 (H) 20.0 - 28.0 mmol/L   Acid-Base Excess 9.7 (H) 0.0 - 2.0 mmol/L   O2 Saturation 98.9 %   Patient temperature 36.1     Allens test (pass/fail) PASS PASS    Comment: Performed at [facility], [address] [place], [place] [address]  Urinalysis, Routine w reflex microscopic Urine, Catheterized     Status: Abnormal   Collection Time: 02/13/21  7:04 PM  Result Value Ref Range   Color, Urine YELLOW YELLOW   APPearance HAZY (A) CLEAR   Specific Gravity, Urine 1.015 1.005 - 1.030   pH 7.0 5.0 - 8.0   Glucose, UA NEGATIVE NEGATIVE mg/dL   Hgb urine dipstick SMALL (A) NEGATIVE   Bilirubin Urine NEGATIVE NEGATIVE   Ketones, ur NEGATIVE NEGATIVE mg/dL   Protein, ur NEGATIVE NEGATIVE mg/dL   Nitrite NEGATIVE NEGATIVE   Leukocytes,Ua SMALL (A) NEGATIVE   RBC / HPF 0-5 0 - 5 RBC/hpf   WBC, UA 11-20 0 - 5 WBC/hpf   Bacteria, UA RARE (A) NONE SEEN   Squamous Epithelial / LPF 6-10 0 - 5   Mucus PRESENT    Hyaline Casts, UA PRESENT     Comment: Performed at [facility], [address] [place], [place] [address]  Lactic acid, plasma     Status: Abnormal   Collection Time: 02/13/21  7:06 PM  Result Value Ref Range   Lactic Acid, Venous 2.2 (HH) 0.5 - 1.9 mmol/L    Comment: CRITICAL RESULT CALLED TO, READ BACK BY AND VERIFIED WITH: [place] 02/13/2021 COLEMAN,R Performed at [facility], [address] [place], [place] [address]   Resp Panel by RT-PCR (Flu A&B, Covid) Nasopharyngeal Swab     Status: None   Collection Time: 02/13/21  9:12 PM   Specimen: Nasopharyngeal Swab; Nasopharyngeal(NP) swabs in vial transport medium  Result Value Ref Range   SARS Coronavirus 2 by RT PCR NEGATIVE NEGATIVE    Comment: (NOTE) SARS-CoV-2 target nucleic acids are NOT DETECTED.  The SARS-CoV-2 RNA is generally detectable in upper respiratory specimens during the acute phase of infection. The lowest concentration of SARS-CoV-2 viral copies this assay can detect is 138 copies/mL. A negative result does not preclude SARS-Cov-2 infection and should not be used as the sole basis for treatment or other patient management  decisions. A negative result may occur with  improper specimen collection/handling, submission of specimen other than nasopharyngeal swab, presence of viral mutation(s) within the areas targeted by this assay, and inadequate number of viral copies(<138 copies/mL). A negative result must be combined with clinical observations, patient history, and epidemiological information. The expected result is Negative.  Fact Sheet for Patients:  EntrepreneurPulse.com.au  Fact Sheet for Healthcare Providers:  IncredibleEmployment.be  This test is no t yet approved or cleared by the [place] FDA and  has been authorized for detection and/or diagnosis of SARS-CoV-2 by FDA under an Emergency Use Authorization (EUA). This EUA will remain  in effect (meaning this test can be used) for the duration of the COVID-19 declaration under Section 564(b)(1) of the Act, 21 U.S.C.section 360bbb-3(b)(1), unless the authorization is terminated  or revoked sooner.       Influenza A by PCR NEGATIVE NEGATIVE   Influenza B by PCR NEGATIVE NEGATIVE    Comment: (NOTE) The Xpert Xpress SARS-CoV-2/FLU/RSV plus assay is intended as an aid in the diagnosis of influenza from Nasopharyngeal swab specimens and should not be used as a sole basis for treatment. Nasal washings and aspirates are unacceptable for Xpert Xpress SARS-CoV-2/FLU/RSV testing.  Fact Sheet for Patients: EntrepreneurPulse.com.au  Fact Sheet for Healthcare Providers: IncredibleEmployment.be  This test is not yet approved or cleared by the [place] FDA and has been authorized for detection and/or diagnosis of SARS-CoV-2 by FDA under an Emergency Use Authorization (EUA). This EUA will remain in effect (meaning this test can be used) for the duration of the COVID-19 declaration under Section 564(b)(1) of the Act, 21 U.S.C. section 360bbb-3(b)(1), unless the authorization  is terminated or revoked.  Performed at [facility], [address] [place], [place] [address]   Lactic acid, plasma     Status: Abnormal   Collection Time: 02/13/21  9:41 PM  Result Value Ref Range   Lactic Acid, Venous 2.3 (HH) 0.5 - 1.9 mmol/L    Comment: CRITICAL RESULT CALLED TO, READ BACK BY AND VERIFIED WITH: RAND,K 2203 02/13/2021 COLEMAN,R Performed at [facility], [address], [place], [place] [address]   Ammonia     Status: None   Collection Time: 02/13/21  9:41 PM  Result Value Ref Range   Ammonia <10 9 - 35 umol/L    Comment: Performed at [facility], [address], [place], [place] [address]    Chemistries  Recent Labs  Lab 02/13/21 1641  NA 135  K 2.8*  CL 87*  CO2 33*  GLUCOSE 161*  BUN 24*  CREATININE 1.50*  CALCIUM 8.8*  AST 34  ALT 18  ALKPHOS 69  BILITOT 1.6*   ------------------------------------------------------------------------------------------------------------------  ------------------------------------------------------------------------------------------------------------------ GFR: Estimated Creatinine Clearance: 27.7 mL/min (A) (by C-G formula based on SCr of 1.5 mg/dL (H)). Liver Function Tests: Recent Labs  Lab 02/13/21 1641  AST 34  ALT 18  ALKPHOS 69  BILITOT 1.6*  PROT 7.9  ALBUMIN 3.9   No results for input(s): LIPASE, AMYLASE in the last 168 hours. Recent Labs  Lab 02/13/21 2141  AMMONIA <10   Coagulation Profile: Recent Labs  Lab 02/13/21 1641  INR 1.1   Cardiac Enzymes: No results for input(s): CKTOTAL, CKMB, CKMBINDEX, TROPONINI in the last 168 hours. BNP (last 3 results) No results for input(s): PROBNP in the last 8760 hours. HbA1C: No results for input(s): HGBA1C in the last 72 hours. CBG: No results for input(s): GLUCAP in the last 168 hours. Lipid Profile: No results  for input(s): CHOL, HDL, LDLCALC, TRIG, CHOLHDL, LDLDIRECT in the last 72 hours. Thyroid Function Tests: No results for  input(s): TSH, T4TOTAL, FREET4, T3FREE, THYROIDAB in the last 72 hours. Anemia Panel: No results for input(s): VITAMINB12, FOLATE, FERRITIN, TIBC, IRON, RETICCTPCT in the last 72 hours.  --------------------------------------------------------------------------------------------------------------- Urine analysis:    Component Value Date/Time   COLORURINE YELLOW 02/13/2021 1904   APPEARANCEUR HAZY (A) 02/13/2021 1904   LABSPEC 1.015 02/13/2021 1904   PHURINE 7.0 02/13/2021 1904   GLUCOSEU NEGATIVE 02/13/2021 1904   GLUCOSEU NEG mg/dL 03/08/2007 2031   HGBUR SMALL (A) 02/13/2021 1904   HGBUR negative 03/06/2008 0846   BILIRUBINUR NEGATIVE 02/13/2021 1904   KETONESUR NEGATIVE 02/13/2021 1904   PROTEINUR NEGATIVE 02/13/2021 1904   UROBILINOGEN 0.2 09/20/2013 1535   NITRITE NEGATIVE 02/13/2021 1904   LEUKOCYTESUR SMALL (A) 02/13/2021 1904      Imaging Results:    CT HEAD WO CONTRAST (5MM)  Result Date: 02/13/2021 CLINICAL DATA:  Altered mental status and weakness EXAM: CT HEAD WITHOUT CONTRAST TECHNIQUE: Contiguous axial images were obtained from the base of the skull through the vertex without intravenous contrast. COMPARISON:  08/25/2020 FINDINGS: Brain: No evidence of acute infarction, hemorrhage, hydrocephalus, extra-axial collection or mass lesion/mass effect. Chronic atrophic changes and chronic white matter ischemic changes are seen. Vascular: Vascular calcifications are noted in the middle cerebral arteries bilaterally. Skull: Normal. Negative for fracture or focal lesion. Sinuses/Orbits: No acute finding. Other: None. IMPRESSION: Chronic atrophic and ischemic changes without acute abnormality. Electronically Signed   By: Inez Catalina M.D.   On: 02/13/2021 20:46   CT ABDOMEN PELVIS W CONTRAST  Result Date: 02/13/2021 CLINICAL DATA:  Abdominal pain, acute, nonlocalized EXAM: CT ABDOMEN AND PELVIS WITH CONTRAST TECHNIQUE: Multidetector CT imaging of the abdomen and pelvis was  performed using the standard protocol following bolus administration of intravenous contrast. CONTRAST:  51mL OMNIPAQUE IOHEXOL 300 MG/ML  SOLN COMPARISON:  02/24/2018 FINDINGS: Lower chest: Cardiomegaly. Aortic atherosclerosis and diffuse coronary artery disease. No acute abnormality. Hepatobiliary: Diffuse low-density throughout the liver throughout the liver compatible with fatty infiltration. Prior cholecystectomy. Common bile duct is dilated measuring 10 mm likely related to age and post cholecystectomy state. No focal hepatic abnormality. Pancreas: No focal abnormality or ductal dilatation. Spleen: No focal abnormality.  Normal size. Adrenals/Urinary Tract: Right lower pole renal cyst. No stones or hydronephrosis. Adrenal glands and urinary bladder unremarkable. Stomach/Bowel: Left colonic diverticulosis. No active diverticulitis. Stomach and small bowel decompressed, unremarkable. Vascular/Lymphatic: 4.3 cm abdominal aortic aneurysm compared to 4 cm previously. No adenopathy. Reproductive: Prior hysterectomy.  No adnexal masses. Other: No free fluid or free air. Musculoskeletal: Severe degenerative changes throughout the lumbar spine. Severe compression fracture at T12, stable. Moderate compression fracture at L5, stable. IMPRESSION: 4.3 cm abdominal aortic aneurysm compared to 4 cm in 2019. Hepatic steatosis. Coronary artery disease, aortic atherosclerosis. Left colonic diverticulosis.  No active diverticulitis. No acute findings. Electronically Signed   By: Rolm Baptise M.D.   On: 02/13/2021 19:09   DG Chest Port 1 View  Result Date: 02/13/2021 CLINICAL DATA:  Questionable sepsis.  Weakness. EXAM: PORTABLE CHEST 1 VIEW COMPARISON:  12/29/2020 FINDINGS: Stable cardiomediastinal contours. Aorta is calcified and tortuous. No focal airspace consolidation, pleural effusion, or pneumothorax. IMPRESSION: No active disease. Electronically Signed   By: Davina Poke D.O.   On: 02/13/2021 17:08        Assessment & Plan:    Active Problems:   AKI (acute kidney injury) ([place])  Dehydration   Lactic acidosis   Hypokalemia   Metabolic alkalosis   Acute metabolic encephalopathy   AKI 2/2 Dehydration Continue IV hydration Hold nephrotoxic agents when possible Monitor I and O Continue to monitor Acute metabolic encephalopathy Described as fatigue, somnolence, and generalized weakness CT head without acute abnormality Procal 0.14 - not likely to be infectious cause, d/c antibiotics Covid pending TSH in the AM Most likely 2/2 dehydration and electrolyte derangement - continue to treat as below Continue to monitor Lactic acidosis Lactic acid 2.3, 2.2, 2.3 2 L bolus in ED 0.5 L Bolus at admission Trend in the AM Dehydration 2/2 Poor PO intake Evidenced by lactic acidosis, AKI, hypotension See plan above Hypokalemia K+ 2/2 poor PO intake K+ 2.8 30 mEq in ED 72mEq at admission Recheck in the AM Check Mag in the AM Metabolic alkalosis pH 7.681 Bicarb 33 Related to hypokalemia and AKI [name] Continue Statin GERD Hold protonix in setting of AKI Diabetes mellitus type 2 From med list, appears to be diet controlled Glucose currently 161 Will add sliding scale coverage   DVT Prophylaxis-   Heparin - SCDs   AM Labs Ordered, also please review Full Orders  Family Communication: Admission, patients condition and plan of care including tests being ordered have been discussed with the patient and daughter who indicate understanding and agree with the plan and Code Status.  Code Status:  DNR  Admission status: Inpatient :The appropriate admission status for this patient is INPATIENT. Inpatient status is judged to be reasonable and necessary in order to provide the required intensity of service to ensure the patient's safety. The patient's presenting symptoms, physical exam findings, and initial radiographic and laboratory data in the context of their chronic comorbidities is felt  to place them at high risk for further clinical deterioration. Furthermore, it is not anticipated that the patient will be medically stable for discharge from the hospital within 2 midnights of admission. The following factors support the admission status of inpatient.     The patient's presenting symptoms include Altered mental status. The worrisome physical exam findings include abdominal tenderness. The initial radiographic and laboratory data are worrisome because of AKI, hypokalemia. The chronic co-morbidities include Afib, depression, dementia, GERD .       * I certify that at the point of admission it is my clinical judgment that the patient will require inpatient hospital care spanning beyond 2 midnights from the point of admission due to high intensity of service, high risk for further deterioration and high frequency of surveillance required.*  Time spent in minutes : [place]

## 2021-02-13 NOTE — ED Provider Notes (Signed)
[facility] EMERGENCY DEPARTMENT Provider Note   CSN: [id number] Arrival date & time: 02/13/21  1603     History No chief complaint on file.   [name] is a 87 y.o. female brought in with chief complaint of weakness.  Family states that the patient has been very weak and lethargic.  They noticed that she had some black stools and more concerned she might have a GI bleed.  She has an extensive past medical history including diabetes, dementia, atrial fibrillation not on chronic anticoagulation, history of E. coli bacteremia and sepsis, UTI, OSA, RA, CVA.  History is limited by the patient's dementia.  She denies any abdominal pain.   Rectal Bleeding     Past Medical History:  Diagnosis Date   [name] (abdominal aortic aneurysm) 09/2013   3.6 cm   Acute and chronic respiratory failure with hypoxia (HCC)    Acute delirium    Acute encephalopathy    Acute pyelonephritis    Atrial fibrillation (HCC)    Chronic pain syndrome    Constipation    Dementia (HCC)    Dependent edema    bilateral legs    Depression    Diabetes mellitus    pre-diabetes   Diverticulosis    Diverticulosis    Dysuria    E coli bacteremia    Fall    Gross hematuria    Hydronephrosis with renal and ureteral calculous obstruction    Hyperlipidemia    Hypertension    Hypoxemia    Idiopathic peripheral autonomic neuropathy    Infestation by bed bug    Nephrolithiasis    OSA on CPAP    Osteoarthritis    Osteopenia    T=  -2.1 in hip   Persistent mood (affective) disorder, unspecified (HCC)    Prediabetes    Rheumatoid arthritis (HCC)    Rheumatoid arthritis(714.0)    S/P carpal tunnel release    Sepsis secondary to UTI [facility])    Spinal stenosis    Unspecified fracture of fifth lumbar vertebra, initial encounter for closed fracture ([place])    Unspecified fracture of t11-T12 vertebra, initial encounter for closed fracture ([place])    Urinary incontinence    Urinary tract infection    hx of     Urolithiasis     Patient Active Problem List   Diagnosis Date Noted   Closed left hip fracture ([place]) 08/25/2020   Leukocytosis 08/25/2020   Hyperglycemia 08/25/2020   Elevated brain natriuretic peptide (BNP) level 08/25/2020   GERD (gastroesophageal reflux disease) 08/25/2020   Cerebrovascular accident (CVA) due to stenosis of basilar artery ([place]) 07/17/2019   Dyspnea 23/53/6144   Acute diastolic CHF (congestive heart failure) ([place]) 07/09/2019   Chest pain    Coronary artery calcification seen on CT scan    Gait abnormality 01/24/2019   Tremor 01/24/2019   Memory loss 01/24/2019   Chronic bilateral low back pain without sciatica 06/06/2018   At risk for falls 06/06/2018   Senile osteoporosis 06/06/2018   Atelectasis of left lung 04/06/2018   Acute encephalopathy 02/09/2018   Urinary tract infection due to extended-spectrum beta lactamase (ESBL) producing Escherichia coli 02/02/2018   Atrial fibrillation, chronic ([place]) 02/02/2018   Constipation 01/31/2018   Urolithiasis 01/30/2018   T12 compression fracture ([place]) 09/04/2017   Fall at home, initial encounter 07/03/2017   Tachycardia 02/03/2017   Chronic pain syndrome 02/02/2017   Dizziness 12/15/2016   Inflammation of sacroiliac joint ([place]) 05/07/2015   Lumbar spondylosis with myelopathy 02/11/2014  Peripheral polyneuropathy 02/11/2014   Opioid dependence ([place]) 02/11/2014   Abnormal EKG 11/21/2013   Displacement of lumbar intervertebral disc without myelopathy 11/18/2013   Lumbar radiculopathy 11/18/2013   [name] (abdominal aortic aneurysm) 09/30/2013   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[place]) 01/03/2013   OSA (obstructive sleep apnea) 06/29/2012   Dementia ([place]) 06/29/2012   Pre-diabetes 06/29/2012   Hypoxia, sleep related 06/29/2012   RA (rheumatoid arthritis) ([place]) 06/29/2012   Nonspecific abnormal results of cardiovascular function study 06/02/2011   Nonspecific abnormal electrocardiogram (ECG) (EKG) 05/18/2011    Osteoporosis 04/09/2008   Hyperlipidemia 05/12/2006   DEPENDENT EDEMA, LEGS, BILATERAL 05/12/2006   PERIPHERAL NEUROPATHY 03/11/2006   Essential hypertension 03/11/2006    Past Surgical History:  Procedure Laterality Date   APPENDECTOMY     CARPAL TUNNEL RELEASE  12/27/2006   Right subcutaneous ulnar nerve transfer -- Right open carpal tunnel release   CHOLECYSTECTOMY     CYSTOSCOPY W/ URETERAL STENT PLACEMENT Right 01/30/2018   Procedure: CYSTOSCOPY WITH STENT REPLACEMENT retrograde pylegram;  Surgeon: Ardis Hughs, MD;  Location: [facility];  Service: Urology;  Laterality: Right;   CYSTOSCOPY WITH URETEROSCOPY AND STENT PLACEMENT Right 03/01/2018   Procedure: CYSTOSCPY/RIGHT URETEROSCOPY HOLMIUM LASER LITHOTRIPSY AND STENT EXCHANGE;  Surgeon: Ardis Hughs, MD;  Location: [facility];  Service: Urology;  Laterality: Right;   FOOT SURGERY     HIP ARTHROPLASTY Left 08/27/2020   Procedure: ARTHROPLASTY BIPOLAR HIP (HEMIARTHROPLASTY);  Surgeon: Mordecai Rasmussen, MD;  Location: [facility];  Service: Orthopedics;  Laterality: Left;   HOLMIUM LASER APPLICATION Right 16/01/9603   Procedure: HOLMIUM LASER APPLICATION;  Surgeon: Ardis Hughs, MD;  Location: [facility];  Service: Urology;  Laterality: Right;   JOINT REPLACEMENT     BTKR, RTHR   TOTAL KNEE ARTHROPLASTY  08/21/2007   Right total knee replacement     OB History   No obstetric history on file.     Family History  Problem Relation Age of Onset   Stroke Father    Coronary artery disease Son        CABG at age 51   Heart disease Son    Heart attack Son    Stroke Mother    Hypertension Sister    Diabetes Brother    Hypertension Maternal Aunt     Social History   Tobacco Use   Smoking status: Never   Smokeless tobacco: Never  Vaping Use   Vaping Use: Never used  Substance Use Topics   Alcohol use: No   Drug use: No    Home Medications Prior to Admission medications   Medication Sig Start Date End Date Taking?  Authorizing Provider  acetaminophen (TYLENOL) 325 MG tablet Take 2 tablets (650 mg total) by mouth every 6 (six) hours. 09/01/20   Johnson, Clanford L, MD  albuterol (VENTOLIN HFA) 108 (90 Base) MCG/ACT inhaler Inhale into the lungs. 09/02/20   [provider]  ALPRAZolam Duanne Moron) 0.25 MG tablet Take 1 tablet (0.25 mg total) by mouth 2 (two) times daily as needed for anxiety. 09/01/20   Johnson, Clanford L, MD  ammonium lactate (AMLACTIN) 12 % cream Apply topically as needed for dry skin. 08/17/18   Trula Slade, DPM  ammonium lactate (LAC-HYDRIN) 12 % lotion Apply topically. 09/01/20   [provider]  aspirin EC 81 MG tablet Take 1 po BID x 6 weeks then resume 1 po daily 09/01/20   Johnson, Clanford L, MD  atenolol (TENORMIN) 50 MG tablet TAKE 1  TABLET BY MOUTH DAILY 09/17/20   [name], MD  bisacodyl (DULCOLAX) 10 MG suppository Place 1 suppository (10 mg total) rectally daily as needed for moderate constipation. 09/01/20   Johnson, Clanford L, MD  Calcium Carbonate-Vitamin D3 600-400 MG-UNIT TABS Take 1 tablet by mouth daily. 02/04/20   [provider]  cephALEXin (KEFLEX) 500 MG capsule Take 1 capsule (500 mg total) by mouth 3 (three) times daily. 12/29/20   [name], MD  diclofenac sodium (VOLTAREN) 1 % GEL Apply 2 g topically 4 (four) times daily. Rub into affected area of foot 2 to 4 times daily 08/17/18   Trula Slade, DPM  DULoxetine (CYMBALTA) 30 MG capsule TAKE 1 CAPSULE BY MOUTH DAILY ALONG WITH 60 MG FOR TOTAL DAILY DOSE OF 90 MG 09/17/20   [name], MD  DULoxetine (CYMBALTA) 60 MG capsule TAKE 1 CAPSULE BY MOUTH DAILY ALONG WITH 30 MG FOR TOTAL DAILY DOSE OF 90 MG 09/17/20   [name], MD  fluticasone (FLONASE) 50 MCG/ACT nasal spray Place 1 spray into both nostrils daily. 09/02/20   [provider]  furosemide (LASIX) 40 MG tablet Take 1 tablet (40 mg total) by mouth daily. 12/29/20   [name], MD  gabapentin  (NEURONTIN) 300 MG capsule Take 1 capsule by mouth 3 (three) times daily. 09/09/20   [provider]  gabapentin (NEURONTIN) 800 MG tablet TAKE 1 TABLET BY MOUTH 3 TIMES DAILY 01/13/21   [name], MD  ketoconazole (NIZORAL) 2 % cream APPLY TOPICALLY DAILY *FOLLOW UP WITH ADAM MCDONALD [phone]* 11/12/20   Evelina Bucy, DPM  MUCINEX 600 MG 12 hr tablet Take 600 mg by mouth 2 (two) times daily. 09/02/20   [provider]  mupirocin ointment (BACTROBAN) 2 % APPLY TOPICALLY TO WOUND TWICE DAILY *FOLLOW UP WITH ADAM MCDONALD [phone]* 11/12/20   Evelina Bucy, DPM  nitrofurantoin, macrocrystal-monohydrate, (MACROBID) 100 MG capsule Take 1 capsule (100 mg total) by mouth 2 (two) times daily. 01/01/21   [name], MD  oxyCODONE (OXY IR/ROXICODONE) 5 MG immediate release tablet Take by mouth. 09/07/20   [provider]  pantoprazole (PROTONIX) 40 MG tablet TAKE 1 TABLET BY MOUTH ONCE DAILY 12/10/20   [name], MD  rosuvastatin (CRESTOR) 40 MG tablet TAKE 1 TABLET BY MOUTH EACH EVENING 08/20/20   [name], MD  senna-docusate (SENOKOT-S) 8.6-50 MG tablet Take 1 tablet by mouth 2 (two) times daily. 09/01/20   Murlean Iba, MD    Allergies    Sulfonamide derivatives  Review of Systems   Review of Systems  Unable to perform ROS: Dementia  Gastrointestinal:  Positive for hematochezia.  Patient not complaining of hematochezia however nursing triage has placed this in the review of systems and I am unable to remove it. Physical Exam Updated Vital Signs BP (!) 87/65 (BP Location: Right Arm)   Pulse 91   Temp (!) 97 F (36.1 C) (Rectal)   Resp 16   Ht 5\' 5"  (1.651 m)   Wt 74.4 kg   SpO2 92%   BMI 27.29 kg/m   Physical Exam Vitals and nursing note reviewed.  Constitutional:      General: She is not in acute distress.    Appearance: She is well-developed. She is not diaphoretic.  HENT:     Head: Normocephalic and atraumatic.      Right Ear: External ear normal.     Left Ear: External ear  normal.     Nose: Nose normal.     Mouth/Throat:     Mouth: Mucous membranes are moist.  Eyes:     General: No scleral icterus.    Conjunctiva/sclera: Conjunctivae normal.  Cardiovascular:     Rate and Rhythm: Normal rate and regular rhythm.     Heart sounds: Normal heart sounds. No murmur heard.   No friction rub. No gallop.  Pulmonary:     Effort: Pulmonary effort is normal. No respiratory distress.     Breath sounds: Normal breath sounds.  Abdominal:     General: Bowel sounds are normal. There is no distension.     Palpations: Abdomen is soft. There is no mass.     Tenderness: There is abdominal tenderness. There is no guarding.     Comments: Tenderness to palpation in the epigastrium and upper quadrants of the abdomen  Musculoskeletal:     Cervical back: Normal range of motion.  Skin:    General: Skin is warm and dry.  Neurological:     Mental Status: She is alert and oriented to person, place, and time.  Psychiatric:        Behavior: Behavior normal.    ED Results / Procedures / Treatments   Labs (all labs ordered are listed, but only abnormal results are displayed) Labs Reviewed  CULTURE, BLOOD (ROUTINE X 2)  CULTURE, BLOOD (ROUTINE X 2)  LACTIC ACID, PLASMA  LACTIC ACID, PLASMA  COMPREHENSIVE METABOLIC PANEL  CBC WITH DIFFERENTIAL/PLATELET  PROTIME-INR  APTT  URINALYSIS, ROUTINE W REFLEX MICROSCOPIC    EKG None  Radiology No results found.  Procedures Procedures   Medications Ordered in ED Medications  lactated ringers bolus 500 mL (has no administration in time range)    ED Course  I have reviewed the triage vital signs and the nursing notes.  Pertinent labs & imaging results that were available during my care of the patient were reviewed by me and considered in my medical decision making (see chart for details).    MDM Rules/Calculators/A&P                            Patient here  with weakness. The differential diagnosis of weakness includes but is not limited to neurologic causes (GBS, myasthenia gravis, CVA, MS, ALS, transverse myelitis, spinal cord injury, CVA, botulism, ) and other causes: ACS, Arrhythmia, syncope, orthostatic hypotension, sepsis, hypoglycemia, electrolyte disturbance, hypothyroidism, respiratory failure, symptomatic anemia, dehydration, heat injury, polypharmacy, malignancy. I ordered and reviewed labs that included CBC without significant abnormality, no elevated white blood cell count, CMP with moderate hypokalemia, elevated blood glucose, patient's creatinine doubled from previous, point-of-care Hemoccult negative for blood, PT/INR within normal limits, UA without evidence of infection, lactic acid elevated.  I ordered and reviewed CT abdomen pelvis with contrast and portable 1 view chest x-ray both of which show no acute abnormalities.  I suspect the patient has significant dehydration given her poor oral intake.  No obvious source of infection, she does not meet sepsis criteria at this time.  No antibiotics given.  Case discussed with Dr. Clearence Ped who will admit the patient. Final Clinical Impression(s) / ED Diagnoses Final diagnoses:  Dehydration  Generalized weakness  AKI (acute kidney injury) [facility])    Rx / DC Orders ED Discharge Orders     None        Margarita Mail, PA-C 02/13/21 2333    Sherwood Gambler, MD 02/13/21 2350

## 2021-02-14 LAB — CBC WITH DIFFERENTIAL/PLATELET
Abs Immature Granulocytes: 0.03 10*3/uL (ref 0.00–0.07)
Basophils Absolute: 0.1 10*3/uL (ref 0.0–0.1)
Basophils Relative: 1 %
Eosinophils Absolute: 0.1 10*3/uL (ref 0.0–0.5)
Eosinophils Relative: 2 %
HCT: 41.7 % (ref 36.0–46.0)
Hemoglobin: 12.6 g/dL (ref 12.0–15.0)
Immature Granulocytes: 0 %
Lymphocytes Relative: 30 %
Lymphs Abs: 2.5 10*3/uL (ref 0.7–4.0)
MCH: 27.8 pg (ref 26.0–34.0)
MCHC: 30.2 g/dL (ref 30.0–36.0)
MCV: 91.9 fL (ref 80.0–100.0)
Monocytes Absolute: 0.8 10*3/uL (ref 0.1–1.0)
Monocytes Relative: 10 %
Neutro Abs: 4.8 10*3/uL (ref 1.7–7.7)
Neutrophils Relative %: 57 %
Platelets: 145 10*3/uL — ABNORMAL LOW (ref 150–400)
RBC: 4.54 MIL/uL (ref 3.87–5.11)
RDW: 14.4 % (ref 11.5–15.5)
WBC: 8.3 10*3/uL (ref 4.0–10.5)
nRBC: 0 % (ref 0.0–0.2)

## 2021-02-14 LAB — TSH: TSH: 0.734 u[IU]/mL (ref 0.350–4.500)

## 2021-02-14 LAB — COMPREHENSIVE METABOLIC PANEL
ALT: 15 U/L (ref 0–44)
AST: 22 U/L (ref 15–41)
Albumin: 3.2 g/dL — ABNORMAL LOW (ref 3.5–5.0)
Alkaline Phosphatase: 53 U/L (ref 38–126)
Anion gap: 10 (ref 5–15)
BUN: 21 mg/dL (ref 8–23)
CO2: 31 mmol/L (ref 22–32)
Calcium: 7.8 mg/dL — ABNORMAL LOW (ref 8.9–10.3)
Chloride: 95 mmol/L — ABNORMAL LOW (ref 98–111)
Creatinine, Ser: 1.22 mg/dL — ABNORMAL HIGH (ref 0.44–1.00)
GFR, Estimated: 43 mL/min — ABNORMAL LOW (ref >60)
Glucose, Bld: 99 mg/dL (ref 70–99)
Potassium: 3.2 mmol/L — ABNORMAL LOW (ref 3.5–5.1)
Sodium: 136 mmol/L (ref 135–145)
Total Bilirubin: 0.6 mg/dL (ref 0.3–1.2)
Total Protein: 6.4 g/dL — ABNORMAL LOW (ref 6.5–8.1)

## 2021-02-14 LAB — MAGNESIUM: Magnesium: 1.4 mg/dL — ABNORMAL LOW (ref 1.7–2.4)

## 2021-02-14 LAB — LACTIC ACID, PLASMA: Lactic Acid, Venous: 1.8 mmol/L (ref 0.5–1.9)

## 2021-02-14 MED ORDER — CALCIUM GLUCONATE-NACL 1-0.675 GM/50ML-% IV SOLN
1.0000 g | Freq: Once | INTRAVENOUS | Status: AC
  Administered 2021-02-14: 1000 mg via INTRAVENOUS
  Filled 2021-02-14: qty 50

## 2021-02-14 MED ORDER — ALBUTEROL SULFATE HFA 108 (90 BASE) MCG/ACT IN AERS: 2.0000 | INHALATION_SPRAY | RESPIRATORY_TRACT | Status: DC | PRN

## 2021-02-14 MED ORDER — SODIUM CHLORIDE 0.9 % IV SOLN: 1.0000 g | INTRAVENOUS | Status: DC

## 2021-02-14 MED ORDER — OXYCODONE HCL 5 MG PO TABS
5.0000 mg | ORAL_TABLET | Freq: Four times a day (QID) | ORAL | Status: DC | PRN
Start: 2021-02-14 — End: 2021-02-14

## 2021-02-14 MED ORDER — SODIUM CHLORIDE 0.9 % IV SOLN
1.0000 g | Freq: Two times a day (BID) | INTRAVENOUS | Status: DC
  Administered 2021-02-14 – 2021-02-17 (×6): 1 g via INTRAVENOUS
  Filled 2021-02-14 (×6): qty 1

## 2021-02-14 MED ORDER — POTASSIUM CHLORIDE CRYS ER 20 MEQ PO TBCR
40.0000 meq | EXTENDED_RELEASE_TABLET | Freq: Two times a day (BID) | ORAL | Status: AC
  Administered 2021-02-14 (×2): 40 meq via ORAL
  Filled 2021-02-14 (×2): qty 2

## 2021-02-14 MED ORDER — OXYCODONE HCL 5 MG PO TABS
5.0000 mg | ORAL_TABLET | ORAL | Status: DC | PRN
  Administered 2021-02-16 – 2021-02-17 (×2): 5 mg via ORAL
  Filled 2021-02-14 (×3): qty 1

## 2021-02-14 MED ORDER — MAGNESIUM SULFATE 2 GM/50ML IV SOLN
2.0000 g | Freq: Once | INTRAVENOUS | Status: AC
  Administered 2021-02-14: 2 g via INTRAVENOUS
  Filled 2021-02-14: qty 50

## 2021-02-14 MED ORDER — ONDANSETRON HCL 4 MG PO TABS: 4.0000 mg | ORAL_TABLET | Freq: Four times a day (QID) | ORAL | Status: DC | PRN

## 2021-02-14 MED ORDER — ASPIRIN EC 81 MG PO TBEC
81.0000 mg | DELAYED_RELEASE_TABLET | Freq: Every day | ORAL | Status: DC
  Administered 2021-02-14 – 2021-02-17 (×4): 81 mg via ORAL
  Filled 2021-02-14 (×4): qty 1

## 2021-02-14 MED ORDER — DULOXETINE HCL 60 MG PO CPEP: 60.0000 mg | ORAL_CAPSULE | ORAL | Status: DC

## 2021-02-14 MED ORDER — HEPARIN SODIUM (PORCINE) 5000 UNIT/ML IJ SOLN
5000.0000 [IU] | Freq: Three times a day (TID) | INTRAMUSCULAR | Status: DC
  Administered 2021-02-14 – 2021-02-17 (×9): 5000 [IU] via SUBCUTANEOUS
  Filled 2021-02-14 (×10): qty 1

## 2021-02-14 MED ORDER — ACETAMINOPHEN 325 MG PO TABS
650.0000 mg | ORAL_TABLET | Freq: Four times a day (QID) | ORAL | Status: DC | PRN
  Administered 2021-02-15 – 2021-02-16 (×2): 650 mg via ORAL
  Filled 2021-02-14 (×2): qty 2

## 2021-02-14 MED ORDER — ONDANSETRON HCL 4 MG/2ML IJ SOLN: 4.0000 mg | Freq: Four times a day (QID) | INTRAMUSCULAR | Status: DC | PRN

## 2021-02-14 MED ORDER — ATENOLOL 25 MG PO TABS
50.0000 mg | ORAL_TABLET | Freq: Every day | ORAL | Status: DC
  Administered 2021-02-14: 50 mg via ORAL
  Filled 2021-02-14 (×2): qty 2

## 2021-02-14 MED ORDER — ASPIRIN EC 81 MG PO TBEC: 81.0000 mg | DELAYED_RELEASE_TABLET | Freq: Every day | ORAL | Status: DC

## 2021-02-14 MED ORDER — GABAPENTIN 400 MG PO CAPS
800.0000 mg | ORAL_CAPSULE | Freq: Three times a day (TID) | ORAL | Status: DC
  Administered 2021-02-14 – 2021-02-15 (×4): 800 mg via ORAL
  Filled 2021-02-14 (×4): qty 2

## 2021-02-14 MED ORDER — SENNOSIDES-DOCUSATE SODIUM 8.6-50 MG PO TABS: 1.0000 | ORAL_TABLET | Freq: Two times a day (BID) | ORAL | Status: DC

## 2021-02-14 MED ORDER — ALBUTEROL SULFATE (2.5 MG/3ML) 0.083% IN NEBU: 2.5000 mg | INHALATION_SOLUTION | RESPIRATORY_TRACT | Status: DC | PRN

## 2021-02-14 MED ORDER — ROSUVASTATIN CALCIUM 20 MG PO TABS
40.0000 mg | ORAL_TABLET | Freq: Every day | ORAL | Status: DC
  Administered 2021-02-14 – 2021-02-17 (×4): 40 mg via ORAL
  Filled 2021-02-14 (×4): qty 2

## 2021-02-14 MED ORDER — ACETAMINOPHEN 650 MG RE SUPP: 650.0000 mg | Freq: Four times a day (QID) | RECTAL | Status: DC | PRN

## 2021-02-14 MED ORDER — MORPHINE SULFATE (PF) 2 MG/ML IV SOLN
2.0000 mg | INTRAVENOUS | Status: DC | PRN
Start: 2021-02-14 — End: 2021-02-17

## 2021-02-14 MED ORDER — HEPARIN SODIUM (PORCINE) 5000 UNIT/ML IJ SOLN: 5000.0000 [IU] | Freq: Three times a day (TID) | INTRAMUSCULAR | Status: DC

## 2021-02-14 MED ORDER — ALPRAZOLAM 0.25 MG PO TABS
0.2500 mg | ORAL_TABLET | Freq: Two times a day (BID) | ORAL | Status: DC | PRN
  Administered 2021-02-17: 0.25 mg via ORAL
  Filled 2021-02-14: qty 1

## 2021-02-14 NOTE — Progress Notes (Signed)
Pharmacy Antibiotic Note  [name] is a 87 y.o. female admitted on 02/13/2021 with UTI.  Pharmacy has been consulted for Merrem dosing. H/O ESBL org  Plan: Merrem 1gm IV q12h F/U cxs and clinical progress Monitor V/S, labs  Height: 5\' 5"  (165.1 cm) Weight: 74.4 kg (164 lb) IBW/kg (Calculated) : 57  Temp (24hrs), Avg:97.5 F (36.4 C), Min:97 F (36.1 C), Max:97.9 F (36.6 C)  Recent Labs  Lab 02/13/21 1641 02/13/21 1906 02/13/21 2141 02/14/21 0503  WBC 9.0  --   --  8.3  CREATININE 1.50*  --   --  1.22*  LATICACIDVEN 2.3* 2.2* 2.3* 1.8    Estimated Creatinine Clearance: 34.1 mL/min (A) (by C-G formula based on SCr of 1.22 mg/dL (H)).    Allergies  Allergen Reactions   Sulfonamide Derivatives Hives and Rash    Antimicrobials this admission: merrem 10/16 >>   Microbiology results: 10/16 BCx: pending 10/16 UCx: pending   Thank you for allowing pharmacy to be a part of this patient's care.  Isac Sarna, BS [place], [place] Clinical Pharmacist Pager [phone]  02/14/2021 3:18 PM

## 2021-02-14 NOTE — Progress Notes (Addendum)
PROGRESS NOTE  Joury Allcorn RWE:[id number] DOB: 1933/11/23 DOA: 02/13/2021 PCP: [name], MD  HPI/Recap of past 24 hours: [name]  is a 87 y.o. female, with history of abdominal aortic aneurysm, chronic hypoxic respiratory failure, atrial fibrillation not on anticoagulation, dementia, chronic anxiety/depression, hyperlipidemia, hypertension, and more presents [facility] ED from home with a chief complaint of fatigue, somnolence, and generalized weakness.  Patient lives with her 2 sons.  She has complained of some abdominal pain.  Work-up in the ED revealed AKI, dehydration, presumptive UTI for which she was started on Rocephin and electrolyte disturbances which are being repleted.  02/14/2021: Patient was seen and examined at her bedside in the ED.  She is more alert and asking to eat.  Started on Rocephin for presented UTI.  Urine culture in process.  Recent history of ESBL E. coli, Rocephin switched to meropenem.  Assessment/Plan: Active Problems:   AKI (acute kidney injury) ([place])   Dehydration   Lactic acidosis   Hypokalemia   Metabolic alkalosis   Acute metabolic encephalopathy  AKI, suspect prerenal in the setting of dehydration from poor oral intake. Baseline creatinine appears to be 0.93 with GFR greater than 60 Presented with creatinine of 1.50 with GFR of 34 Creatinine downtrending on IV fluid Monitor urine output with strict I's and O's. Avoid nephrotoxic agents and dehydration.  Presumptive UTI, recent history of ESBL E. Coli UA positive for pyuria, lactic acid 2.3. Urine culture in process. Urine culture obtained from 12/29/2020 positive for ESBL E. coli. Start meropenem, follow urine culture for ID and sensitivities. Also follow blood cultures x2 peripherally.  Acute metabolic encephalopathy in the setting of dementia suspect secondary to UTI. More alert at the time of this visit Reorient as needed Fall precautions Delirium  precautions.  Hypokalemia Serum potassium 3.2 Repleted orally with KCl 40 mEq twice daily x2 doses. Repeat BMP in the morning.  Hypomagnesemia Magnesium 1.4 Repleted intravenously with IV magnesium sulfate 2 g x 1 dose. Repeat BMP and magnesium level in the morning.  Hypokalemia Calcium 7.8, albumin 3.2 Corrected calcium for albumin 3.4 Repleted with IV calcium gluconate 1 g x 1 dose. Repeat BMP in the morning.  Generalized weakness PT OT to assess Fall precautions.    Critical care time: 55 minutes.   Code Status: DNR  Family Communication: None at bedside  Disposition Plan: Likely will discharge to home with home health services.   Consultants: None  Procedures: None  Antimicrobials: Meropenem  DVT prophylaxis: Subcu heparin 3 times daily  Status is: Inpatient  Patient will require at least 2 midnights for further evaluation and treatment of present condition.      Objective: Vitals:   02/14/21 1134 02/14/21 1200 02/14/21 1215 02/14/21 1400  BP:  120/72  104/74  Pulse:  85 75 80  Resp:  18 16 (!) 24  Temp: 97.9 F (36.6 C)     TempSrc: Oral     SpO2:  100% 99% 100%  Weight:      Height:        Intake/Output Summary (Last 24 hours) at 02/14/2021 1501 Last data filed at 02/14/2021 1332 Gross per 24 hour  Intake 2712.48 ml  Output 1100 ml  Net 1612.48 ml   Filed Weights   02/13/21 1607  Weight: 74.4 kg    Exam:  General: 87 y.o. year-old female well developed well nourished in no acute distress.  Alert and pleasantly confused. Cardiovascular: Regular rate and rhythm with no rubs or  gallops.  No thyromegaly or JVD noted.   Respiratory: Clear to auscultation with no wheezes or rales. Good inspiratory effort. Abdomen: Soft nontender nondistended with normal bowel sounds x4 quadrants. Musculoskeletal: No lower extremity edema. 2/4 pulses in all 4 extremities. Skin: Trace ulcerative lesions noted or rashes, Psychiatry: Mood is  appropriate for condition and setting   Data Reviewed: CBC: Recent Labs  Lab 02/13/21 1641 02/14/21 0503  WBC 9.0 8.3  NEUTROABS 6.6 4.8  HGB 14.7 12.6  HCT 46.5* 41.7  MCV 90.1 91.9  PLT 217 811*   Basic Metabolic Panel: Recent Labs  Lab 02/13/21 1641 02/14/21 0503  NA 135 136  K 2.8* 3.2*  CL 87* 95*  CO2 33* 31  GLUCOSE 161* 99  BUN 24* 21  CREATININE 1.50* 1.22*  CALCIUM 8.8* 7.8*  MG  --  1.4*   GFR: Estimated Creatinine Clearance: 34.1 mL/min (A) (by C-G formula based on SCr of 1.22 mg/dL (H)). Liver Function Tests: Recent Labs  Lab 02/13/21 1641 02/14/21 0503  AST 34 22  ALT 18 15  ALKPHOS 69 53  BILITOT 1.6* 0.6  PROT 7.9 6.4*  ALBUMIN 3.9 3.2*   No results for input(s): LIPASE, AMYLASE in the last 168 hours. Recent Labs  Lab 02/13/21 2141  AMMONIA <10   Coagulation Profile: Recent Labs  Lab 02/13/21 1641  INR 1.1   Cardiac Enzymes: No results for input(s): CKTOTAL, CKMB, CKMBINDEX, TROPONINI in the last 168 hours. BNP (last 3 results) No results for input(s): PROBNP in the last 8760 hours. HbA1C: No results for input(s): HGBA1C in the last 72 hours. CBG: No results for input(s): GLUCAP in the last 168 hours. Lipid Profile: No results for input(s): CHOL, HDL, LDLCALC, TRIG, CHOLHDL, LDLDIRECT in the last 72 hours. Thyroid Function Tests: Recent Labs    02/14/21 0503  TSH 0.734   Anemia Panel: No results for input(s): VITAMINB12, FOLATE, FERRITIN, TIBC, IRON, RETICCTPCT in the last 72 hours. Urine analysis:    Component Value Date/Time   COLORURINE YELLOW 02/13/2021 1904   APPEARANCEUR HAZY (A) 02/13/2021 1904   LABSPEC 1.015 02/13/2021 1904   PHURINE 7.0 02/13/2021 1904   GLUCOSEU NEGATIVE 02/13/2021 1904   GLUCOSEU NEG mg/dL 03/08/2007 2031   HGBUR SMALL (A) 02/13/2021 1904   HGBUR negative 03/06/2008 0846   BILIRUBINUR NEGATIVE 02/13/2021 1904   KETONESUR NEGATIVE 02/13/2021 1904   PROTEINUR NEGATIVE 02/13/2021 1904    UROBILINOGEN 0.2 09/20/2013 1535   NITRITE NEGATIVE 02/13/2021 1904   LEUKOCYTESUR SMALL (A) 02/13/2021 1904   Sepsis Labs: @LABRCNTIP (procalcitonin:4,lacticidven:4)  ) Recent Results (from the past 240 hour(s))  Blood Culture (routine x 2)     Status: None (Preliminary result)   Collection Time: 02/13/21  4:42 PM   Specimen: Left Antecubital; Blood  Result Value Ref Range Status   Specimen Description LEFT ANTECUBITAL  Final   Special Requests Immunocompromised  Final   Culture   Final    NO GROWTH < 24 HOURS Performed at [facility], [address], [place], [place] [address]    Report Status PENDING  Incomplete  Blood Culture (routine x 2)     Status: None (Preliminary result)   Collection Time: 02/13/21  4:50 PM   Specimen: BLOOD RIGHT HAND  Result Value Ref Range Status   Specimen Description BLOOD RIGHT HAND  Final   Special Requests Immunocompromised  Final   Culture   Final    NO GROWTH < 24 HOURS Performed at [facility], [address], [place], [place] [address]    Report Status PENDING  Incomplete  Resp Panel by RT-PCR (Flu A&B, Covid) Nasopharyngeal Swab     Status: None   Collection Time: 02/13/21  9:12 PM   Specimen: Nasopharyngeal Swab; Nasopharyngeal(NP) swabs in vial transport medium  Result Value Ref Range Status   SARS Coronavirus 2 by RT PCR NEGATIVE NEGATIVE Final    Comment: (NOTE) SARS-CoV-2 target nucleic acids are NOT DETECTED.  The SARS-CoV-2 RNA is generally detectable in upper respiratory specimens during the acute phase of infection. The lowest concentration of SARS-CoV-2 viral copies this assay can detect is 138 copies/mL. A negative result does not preclude SARS-Cov-2 infection and should not be used as the sole basis for treatment or other patient management decisions. A negative result may occur with  improper specimen collection/handling, submission of specimen other than nasopharyngeal swab, presence of viral mutation(s) within  the areas targeted by this assay, and inadequate number of viral copies(<138 copies/mL). A negative result must be combined with clinical observations, patient history, and epidemiological information. The expected result is Negative.  Fact Sheet for Patients:  EntrepreneurPulse.com.au  Fact Sheet for Healthcare Providers:  IncredibleEmployment.be  This test is no t yet approved or cleared by the [place] FDA and  has been authorized for detection and/or diagnosis of SARS-CoV-2 by FDA under an Emergency Use Authorization (EUA). This EUA will remain  in effect (meaning this test can be used) for the duration of the COVID-19 declaration under Section 564(b)(1) of the Act, 21 U.S.C.section 360bbb-3(b)(1), unless the authorization is terminated  or revoked sooner.       Influenza A by PCR NEGATIVE NEGATIVE Final   Influenza B by PCR NEGATIVE NEGATIVE Final    Comment: (NOTE) The Xpert Xpress SARS-CoV-2/FLU/RSV plus assay is intended as an aid in the diagnosis of influenza from Nasopharyngeal swab specimens and should not be used as a sole basis for treatment. Nasal washings and aspirates are unacceptable for Xpert Xpress SARS-CoV-2/FLU/RSV testing.  Fact Sheet for Patients: EntrepreneurPulse.com.au  Fact Sheet for Healthcare Providers: IncredibleEmployment.be  This test is not yet approved or cleared by the [place] FDA and has been authorized for detection and/or diagnosis of SARS-CoV-2 by FDA under an Emergency Use Authorization (EUA). This EUA will remain in effect (meaning this test can be used) for the duration of the COVID-19 declaration under Section 564(b)(1) of the Act, 21 U.S.C. section 360bbb-3(b)(1), unless the authorization is terminated or revoked.  Performed at [facility], [address] [place], [place] [address]       Studies: CT HEAD WO CONTRAST (5MM)  Result Date:  02/13/2021 CLINICAL DATA:  Altered mental status and weakness EXAM: CT HEAD WITHOUT CONTRAST TECHNIQUE: Contiguous axial images were obtained from the base of the skull through the vertex without intravenous contrast. COMPARISON:  08/25/2020 FINDINGS: Brain: No evidence of acute infarction, hemorrhage, hydrocephalus, extra-axial collection or mass lesion/mass effect. Chronic atrophic changes and chronic white matter ischemic changes are seen. Vascular: Vascular calcifications are noted in the middle cerebral arteries bilaterally. Skull: Normal. Negative for fracture or focal lesion. Sinuses/Orbits: No acute finding. Other: None. IMPRESSION: Chronic atrophic and ischemic changes without acute abnormality. Electronically Signed   By: Inez Catalina M.D.   On: 02/13/2021 20:46   CT ABDOMEN PELVIS W CONTRAST  Result Date: 02/13/2021 CLINICAL DATA:  Abdominal pain, acute, nonlocalized EXAM: CT ABDOMEN AND PELVIS WITH CONTRAST TECHNIQUE: Multidetector CT imaging of the abdomen and pelvis was performed using the standard  protocol following bolus administration of intravenous contrast. CONTRAST:  55mL OMNIPAQUE IOHEXOL 300 MG/ML  SOLN COMPARISON:  02/24/2018 FINDINGS: Lower chest: Cardiomegaly. Aortic atherosclerosis and diffuse coronary artery disease. No acute abnormality. Hepatobiliary: Diffuse low-density throughout the liver throughout the liver compatible with fatty infiltration. Prior cholecystectomy. Common bile duct is dilated measuring 10 mm likely related to age and post cholecystectomy state. No focal hepatic abnormality. Pancreas: No focal abnormality or ductal dilatation. Spleen: No focal abnormality.  Normal size. Adrenals/Urinary Tract: Right lower pole renal cyst. No stones or hydronephrosis. Adrenal glands and urinary bladder unremarkable. Stomach/Bowel: Left colonic diverticulosis. No active diverticulitis. Stomach and small bowel decompressed, unremarkable. Vascular/Lymphatic: 4.3 cm abdominal aortic  aneurysm compared to 4 cm previously. No adenopathy. Reproductive: Prior hysterectomy.  No adnexal masses. Other: No free fluid or free air. Musculoskeletal: Severe degenerative changes throughout the lumbar spine. Severe compression fracture at T12, stable. Moderate compression fracture at L5, stable. IMPRESSION: 4.3 cm abdominal aortic aneurysm compared to 4 cm in 2019. Hepatic steatosis. Coronary artery disease, aortic atherosclerosis. Left colonic diverticulosis.  No active diverticulitis. No acute findings. Electronically Signed   By: Rolm Baptise M.D.   On: 02/13/2021 19:09   DG Chest Port 1 View  Result Date: 02/13/2021 CLINICAL DATA:  Questionable sepsis.  Weakness. EXAM: PORTABLE CHEST 1 VIEW COMPARISON:  12/29/2020 FINDINGS: Stable cardiomediastinal contours. Aorta is calcified and tortuous. No focal airspace consolidation, pleural effusion, or pneumothorax. IMPRESSION: No active disease. Electronically Signed   By: Davina Poke D.O.   On: 02/13/2021 17:08    Scheduled Meds:  aspirin EC  81 mg Oral Daily   atenolol  50 mg Oral Daily   DULoxetine  60 mg Oral See admin instructions   gabapentin  800 mg Oral TID   heparin  5,000 Units Subcutaneous Q8H   potassium chloride  40 mEq Oral BID   rosuvastatin  40 mg Oral Daily    Continuous Infusions:  sodium chloride 75 mL/hr at 02/14/21 1331   cefTRIAXone (ROCEPHIN)  IV       LOS: 1 day     Kayleen Memos, MD [facility] Pager [phone]  If 7PM-7AM, please contact night-coverage [email or web address] Password [email or web address] 02/14/2021, 3:01 PM

## 2021-02-15 LAB — COMPREHENSIVE METABOLIC PANEL
ALT: 13 U/L (ref 0–44)
AST: 22 U/L (ref 15–41)
Albumin: 3.1 g/dL — ABNORMAL LOW (ref 3.5–5.0)
Alkaline Phosphatase: 51 U/L (ref 38–126)
Anion gap: 6 (ref 5–15)
BUN: 19 mg/dL (ref 8–23)
CO2: 30 mmol/L (ref 22–32)
Calcium: 8.5 mg/dL — ABNORMAL LOW (ref 8.9–10.3)
Chloride: 102 mmol/L (ref 98–111)
Creatinine, Ser: 1.14 mg/dL — ABNORMAL HIGH (ref 0.44–1.00)
GFR, Estimated: 47 mL/min — ABNORMAL LOW (ref >60)
Glucose, Bld: 114 mg/dL — ABNORMAL HIGH (ref 70–99)
Potassium: 4.5 mmol/L (ref 3.5–5.1)
Sodium: 138 mmol/L (ref 135–145)
Total Bilirubin: 1 mg/dL (ref 0.3–1.2)
Total Protein: 6.2 g/dL — ABNORMAL LOW (ref 6.5–8.1)

## 2021-02-15 LAB — CBC
HCT: 40.3 % (ref 36.0–46.0)
Hemoglobin: 11.8 g/dL — ABNORMAL LOW (ref 12.0–15.0)
MCH: 27.4 pg (ref 26.0–34.0)
MCHC: 29.3 g/dL — ABNORMAL LOW (ref 30.0–36.0)
MCV: 93.7 fL (ref 80.0–100.0)
Platelets: 137 10*3/uL — ABNORMAL LOW (ref 150–400)
RBC: 4.3 MIL/uL (ref 3.87–5.11)
RDW: 14.6 % (ref 11.5–15.5)
WBC: 5.6 10*3/uL (ref 4.0–10.5)
nRBC: 0 % (ref 0.0–0.2)

## 2021-02-15 LAB — PHOSPHORUS: Phosphorus: 3.1 mg/dL (ref 2.5–4.6)

## 2021-02-15 LAB — MAGNESIUM: Magnesium: 1.7 mg/dL (ref 1.7–2.4)

## 2021-02-15 MED ORDER — GABAPENTIN 100 MG PO CAPS
100.0000 mg | ORAL_CAPSULE | Freq: Three times a day (TID) | ORAL | Status: DC
  Administered 2021-02-15 – 2021-02-17 (×6): 100 mg via ORAL
  Filled 2021-02-15 (×6): qty 1

## 2021-02-15 NOTE — TOC Initial Note (Signed)
Transition of Care [facility]) - Initial/Assessment Note    Patient Details  Name: [name] MRN: [id number] Date of Birth: [birth year]  Transition of [facility]) CM/SW Contact:    Shade Flood, LCSW Phone Number: 02/15/2021, 12:54 PM  Clinical Narrative:                  Pt admitted from home. She is high risk for readmission. Spoke with pt's son by phone today to assess. Pt lives with two of her sons. She has an aide Mon-Fri 8A-11A. Son states that someone is with pt 24/7. She uses a walker for independent ambulation but son states that someone has to be there for supervision purposes while she is ambulating. Son takes pt to appointments. Prescription medications are delivered to the home. Son states pt used to have a cpap but that it broke and was never replaced. He states that ED MD indicated pt would need a new one. Explained that pt will need a new sleep study since it has been over 10 years since the last one and that MD here would have to make a referral for one to be done as an outpatient after dc. Son expressed understanding. Updated MD who states he will refer.  TOC will follow and assist with dc planning.  Expected Discharge Plan: [place] Barriers to Discharge: Continued Medical Work up   Patient Goals and CMS Choice Patient states their goals for this hospitalization and ongoing recovery are:: go home CMS Medicare.gov Compare Post Acute Care list provided to:: Patient Represenative (must comment) Choice offered to / list presented to : Adult Children  Expected Discharge Plan and Services Expected Discharge Plan: [place] In-house Referral: Clinical Social Work     Living arrangements for the past 2 months: Single Family Home                                      Prior Living Arrangements/Services Living arrangements for the past 2 months: Single Family Home Lives with:: Adult Children Patient language and need for  interpreter reviewed:: Yes        Need for Family Participation in Patient Care: Yes (Comment) Care giver support system in place?: Yes (comment) Current home services: DME, Other (comment) (aide) Criminal Activity/Legal Involvement Pertinent to Current Situation/Hospitalization: No - Comment as needed  Activities of Daily Living Home Assistive Devices/Equipment: Walker (specify type) ADL Screening (condition at time of admission) Patient's cognitive ability adequate to safely complete daily activities?: Yes Is the patient deaf or have difficulty hearing?: Yes Does the patient have difficulty seeing, even when wearing glasses/contacts?: No Does the patient have difficulty concentrating, remembering, or making decisions?: Yes Patient able to express need for assistance with ADLs?: Yes Does the patient have difficulty dressing or bathing?: Yes Independently performs ADLs?: No Communication: Independent Dressing (OT): Needs assistance Is this a change from baseline?: Pre-admission baseline Grooming: Needs assistance Is this a change from baseline?: Pre-admission baseline Feeding: Independent Bathing: Needs assistance Is this a change from baseline?: Pre-admission baseline Toileting: Needs assistance Is this a change from baseline?: Pre-admission baseline In/Out Bed: Needs assistance Is this a change from baseline?: Pre-admission baseline Walks in Home: Needs assistance Is this a change from baseline?: Pre-admission baseline Does the patient have difficulty walking or climbing stairs?: Yes Weakness of Legs: Both Weakness of Arms/Hands: None  Permission Sought/Granted  Emotional Assessment       Orientation: : Oriented to Self Alcohol / Substance Use: Not Applicable Psych Involvement: No (comment)  Admission diagnosis:  Dehydration [E86.0] Generalized weakness [R53.1] AKI (acute kidney injury) ([place]) [N17.9] Patient Active Problem List   Diagnosis Date  Noted   AKI (acute kidney injury) ([place]) 02/13/2021   Dehydration 02/13/2021   Lactic acidosis 02/13/2021   Hypokalemia 82/95/6213   Metabolic alkalosis 08/65/7846   Acute metabolic encephalopathy 96/29/5284   Closed left hip fracture ([place]) 08/25/2020   Leukocytosis 08/25/2020   Hyperglycemia 08/25/2020   Elevated brain natriuretic peptide (BNP) level 08/25/2020   GERD (gastroesophageal reflux disease) 08/25/2020   Cerebrovascular accident (CVA) due to stenosis of basilar artery ([place]) 07/17/2019   Dyspnea 13/24/4010   Acute diastolic CHF (congestive heart failure) ([place]) 07/09/2019   Chest pain    Coronary artery calcification seen on CT scan    Gait abnormality 01/24/2019   Tremor 01/24/2019   Memory loss 01/24/2019   Chronic bilateral low back pain without sciatica 06/06/2018   At risk for falls 06/06/2018   Senile osteoporosis 06/06/2018   Atelectasis of left lung 04/06/2018   Acute encephalopathy 02/09/2018   Urinary tract infection due to extended-spectrum beta lactamase (ESBL) producing Escherichia coli 02/02/2018   Atrial fibrillation, chronic ([place]) 02/02/2018   Constipation 01/31/2018   Urolithiasis 01/30/2018   T12 compression fracture ([place]) 09/04/2017   Fall at home, initial encounter 07/03/2017   Tachycardia 02/03/2017   Chronic pain syndrome 02/02/2017   Dizziness 12/15/2016   Inflammation of sacroiliac joint ([place]) 05/07/2015   Lumbar spondylosis with myelopathy 02/11/2014   Peripheral polyneuropathy 02/11/2014   Opioid dependence ([place]) 02/11/2014   Abnormal EKG 11/21/2013   Displacement of lumbar intervertebral disc without myelopathy 11/18/2013   Lumbar radiculopathy 11/18/2013   [name] (abdominal aortic aneurysm) 09/30/2013   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[place]) 01/03/2013   OSA (obstructive sleep apnea) 06/29/2012   Dementia ([place]) 06/29/2012   Pre-diabetes 06/29/2012   Hypoxia, sleep related 06/29/2012   RA (rheumatoid arthritis) ([place]) 06/29/2012    Nonspecific abnormal results of cardiovascular function study 06/02/2011   Nonspecific abnormal electrocardiogram (ECG) (EKG) 05/18/2011   Osteoporosis 04/09/2008   Hyperlipidemia 05/12/2006   DEPENDENT EDEMA, LEGS, BILATERAL 05/12/2006   PERIPHERAL NEUROPATHY 03/11/2006   Essential hypertension 03/11/2006   PCP:  [name], MD Pharmacy:   [id number] - [place], [place] - [address] [place] [address] Phone: [phone] Fax: [phone]     Social Determinants of Health (SDOH) Interventions    Readmission Risk Interventions No flowsheet data found.

## 2021-02-15 NOTE — Progress Notes (Signed)
PROGRESS NOTE    [name]  ZOX:[id number] DOB: [birth year] DOA: 02/13/2021 PCP: Donita Brooks, MD    Brief Narrative:  Mrs. Borrayo was admitted to the hospital with the working diagnosis of AKI and acute metabolic encephalopathy in the setting if urinary tract infection.    87 year old female past medical history for abdominal arctic aneurysm, chronic hypoxic respiratory failure, atrial fibrillation, depression, type II [place] mellitus, dyslipidemia, hypertension and dementia who presented with altered mental status.  Patient was noted to have dizziness upon standing and difficulty walking.  Over the last 2 to 3 days leading to hospitalization he had decreased p.o. intake. Because of persistent symptoms she was brought to the hospital for further evaluation.  On her initial physical examination she was afebrile, heart rate 70-91, respiratory rate 11-19, blood pressure 83/66, 115/97, oxygen saturation 99%.  Patient was awake and alert x2, her lungs were clear to auscultation bilaterally, heart S1-S2, present, rhythmic, soft abdomen, no lower extremity edema.  ABG pH 7.57, PCO2 35, PO2 133, bicarb 33.8.  Sodium 135, potassium 2.8, chloride 87, bicarb 33, glucose 161, BUN 24, creatinine 1.50, white count 9.0, hemoglobin 14.7, hematocrit 46.5, platelets 217. SARS COVID-19 negative.  Urinalysis specific gravity 1.015, 0-5 red cells, 11-20 white cells.  Head CT negative for acute changes. CT of the abdomen pelvis with 4.3 cm abdominal arctic aneurysm.  Hepatic steatosis.  Diverticulosis.  No diverticulitis.  Chest radiograph mild cardiomegaly, bilateral atelectasis..  EKG 88 bpm, normal axis, normal QTC, atrial fibrillation rhythm, no significant ST segment or T wave changes.  Assessment & Plan:   Active Problems:   AKI (acute kidney injury) (HCC)   Dehydration   Lactic acidosis   Hypokalemia   Metabolic alkalosis   Acute metabolic encephalopathy   Acute metabolic encephalopathy  in the setting of urinary tract infection. (No sepsis)  Patient is more awake and alert, continue to be very weak and deconditioned.  Wbc is 5,6, urine culture positive for E coli >100,000 CFU, old records personally reviewed she had ESBL E coli in the urine.   Plan to continue antibiotic therapy with meropenem, and wait for sensitivities, Decrease dose of gabapentin in the setting of reduction in GFR.   2. AKI, hypokalemia, hypomagnesemia renal function with serum cr at 1,14 with K at 4,5 and serum bicarbonate at 30, Mg is 1,7 Patient is tolerating po well Plan to hold on IV fluids for now.   3. Dementia, anxiety and depression. No agitation, continue with alprazolam and duloxetine   4. Dyslipidemia. Continue with rosuvastatin   5. HTN. Discontinue atenolol due to risk of hypotension.   Patient continue to be at high risk for  worsening encephalopathy   Status is: Inpatient  Remains inpatient appropriate because: IV antibiotics      DVT prophylaxis:  Enoxaparin   Code Status:    DNR  Family Communication:  No family at the bedside    Antimicrobials:  Imipenem    Subjective: Patient with no nausea or vomiting, no chest pain or dyspnea, continue to be very weak and deconditioned   Objective: Vitals:   02/14/21 2019 02/14/21 2037 02/15/21 0522 02/15/21 0941  BP: (!) 83/64 92/67 102/81 108/72  Pulse: 78 82 88 72  Resp: 18 20 18 18   Temp: 98.7 F (37.1 C) 98.2 F (36.8 C) 98.3 F (36.8 C)   TempSrc: Oral Oral    SpO2: 100% 99% 99% 95%  Weight:      Height:  Intake/Output Summary (Last 24 hours) at 02/15/2021 1249 Last data filed at 02/15/2021 0900 Gross per 24 hour  Intake 2545.24 ml  Output 600 ml  Net 1945.24 ml   Filed Weights   02/13/21 1607  Weight: 74.4 kg    Examination:   General: Not in pain or dyspnea, deconditioned and ill looking appearing  Neurology: Awake and alert, non focal  E ENT: no pallor, no icterus, oral mucosa  moist Cardiovascular: No JVD. S1-S2 present, rhythmic, no gallops, rubs, or murmurs. No lower extremity edema. Pulmonary: positive breath sounds bilaterally, adequate air movement, no wheezing, rhonchi or rales. Gastrointestinal. Abdomen soft and non tender Skin. No rashes Musculoskeletal: no joint deformities     Data Reviewed: I have personally reviewed following labs and imaging studies  CBC: Recent Labs  Lab 02/13/21 1641 02/14/21 0503 02/15/21 0615  WBC 9.0 8.3 5.6  NEUTROABS 6.6 4.8  --   HGB 14.7 12.6 11.8*  HCT 46.5* 41.7 40.3  MCV 90.1 91.9 93.7  PLT 217 145* 137*   Basic Metabolic Panel: Recent Labs  Lab 02/13/21 1641 02/14/21 0503 02/15/21 0615  NA 135 136 138  K 2.8* 3.2* 4.5  CL 87* 95* 102  CO2 33* 31 30  GLUCOSE 161* 99 114*  BUN 24* 21 19  CREATININE 1.50* 1.22* 1.14*  CALCIUM 8.8* 7.8* 8.5*  MG  --  1.4* 1.7  PHOS  --   --  3.1   GFR: Estimated Creatinine Clearance: 36.5 mL/min (A) (by C-G formula based on SCr of 1.14 mg/dL (H)). Liver Function Tests: Recent Labs  Lab 02/13/21 1641 02/14/21 0503 02/15/21 0615  AST 34 22 22  ALT 18 15 13   ALKPHOS 69 53 51  BILITOT 1.6* 0.6 1.0  PROT 7.9 6.4* 6.2*  ALBUMIN 3.9 3.2* 3.1*   No results for input(s): LIPASE, AMYLASE in the last 168 hours. Recent Labs  Lab 02/13/21 2141  AMMONIA <10   Coagulation Profile: Recent Labs  Lab 02/13/21 1641  INR 1.1   Cardiac Enzymes: No results for input(s): CKTOTAL, CKMB, CKMBINDEX, TROPONINI in the last 168 hours. BNP (last 3 results) No results for input(s): PROBNP in the last 8760 hours. HbA1C: No results for input(s): HGBA1C in the last 72 hours. CBG: No results for input(s): GLUCAP in the last 168 hours. Lipid Profile: No results for input(s): CHOL, HDL, LDLCALC, TRIG, CHOLHDL, LDLDIRECT in the last 72 hours. Thyroid Function Tests: Recent Labs    02/14/21 0503  TSH 0.734   Anemia Panel: No results for input(s): VITAMINB12, FOLATE,  FERRITIN, TIBC, IRON, RETICCTPCT in the last 72 hours.    Radiology Studies: I have reviewed all of the imaging during this hospital visit personally     Scheduled Meds:  aspirin EC  81 mg Oral Daily   atenolol  50 mg Oral Daily   DULoxetine  60 mg Oral See admin instructions   gabapentin  800 mg Oral TID   heparin  5,000 Units Subcutaneous Q8H   rosuvastatin  40 mg Oral Daily   Continuous Infusions:  sodium chloride 75 mL/hr at 02/15/21 0507   meropenem (MERREM) IV 1 g (02/15/21 1147)     LOS: 2 days        Santoria Chason Annett Gula, MD

## 2021-02-15 NOTE — Plan of Care (Signed)

## 2021-02-16 LAB — URINE CULTURE: Culture: >=100000 — AB

## 2021-02-16 LAB — BASIC METABOLIC PANEL
Anion gap: 7 (ref 5–15)
BUN: 17 mg/dL (ref 8–23)
CO2: 30 mmol/L (ref 22–32)
Calcium: 8.9 mg/dL (ref 8.9–10.3)
Chloride: 100 mmol/L (ref 98–111)
Creatinine, Ser: 1.05 mg/dL — ABNORMAL HIGH (ref 0.44–1.00)
GFR, Estimated: 52 mL/min — ABNORMAL LOW (ref >60)
Glucose, Bld: 130 mg/dL — ABNORMAL HIGH (ref 70–99)
Potassium: 4 mmol/L (ref 3.5–5.1)
Sodium: 137 mmol/L (ref 135–145)

## 2021-02-16 MED ORDER — DULOXETINE HCL 60 MG PO CPEP
90.0000 mg | ORAL_CAPSULE | Freq: Every day | ORAL | Status: DC
  Administered 2021-02-16 – 2021-02-17 (×2): 90 mg via ORAL
  Filled 2021-02-16 (×2): qty 1

## 2021-02-16 MED ORDER — POLYETHYLENE GLYCOL 3350 17 G PO PACK
17.0000 g | PACK | Freq: Every day | ORAL | Status: DC
  Administered 2021-02-16 – 2021-02-17 (×2): 17 g via ORAL
  Filled 2021-02-16 (×2): qty 1

## 2021-02-16 NOTE — TOC Progression Note (Signed)
Transition of Care [facility]) - Progression Note    Patient Details  Name: [name] MRN: [id number] Date of Birth: [birth year]  Transition of [facility]) CM/SW Contact  Shade Flood, LCSW Phone Number: 02/16/2021, 10:54 AM  Clinical Narrative:     TOC following for dc planning. Anticipating dc with HH in 1-2 days. [place] referral made to [facility] [address] who accepted. Pt will need HH orders placed. TOC will follow.   Expected Discharge Plan: [place] Barriers to Discharge: Continued Medical Work up  Expected Discharge Plan and Services Expected Discharge Plan: [place] In-house Referral: Clinical Social Work   Post Acute Care Choice: [place] arrangements for the past 2 months: [place]: RN, PT [facility] Agency: [place] Date [place]: 02/16/21   Representative spoke with at [place]: [place] ([place]) Interventions    Readmission Risk Interventions Readmission Risk Prevention Plan 02/16/2021  Transportation Screening Complete  HRI or [place] Complete  Social Work Consult for [place] Planning/Counseling Complete  Palliative Care Screening Not Applicable  Medication Review Press photographer) Complete  Some recent data might be hidden

## 2021-02-16 NOTE — Plan of Care (Signed)

## 2021-02-16 NOTE — Plan of Care (Signed)
  Problem: Acute Rehab PT Goals(only PT should resolve) Goal: Pt Will Go Supine/Side To Sit Outcome: Progressing Flowsheets (Taken 02/16/2021 1103) Pt will go Supine/Side to Sit: with min guard assist Goal: Pt Will Go Sit To Supine/Side Outcome: Progressing Flowsheets (Taken 02/16/2021 1103) Pt will go Sit to Supine/Side: with supervision Goal: Patient Will Transfer Sit To/From Stand Outcome: Progressing Flowsheets (Taken 02/16/2021 1103) Patient will transfer sit to/from stand: with supervision Goal: Pt Will Ambulate Outcome: Progressing Flowsheets (Taken 02/16/2021 1103) Pt will Ambulate:  50 feet  with min guard assist  with rolling walker Goal: Pt/caregiver will Perform Home Exercise Program Outcome: Progressing Flowsheets (Taken 02/16/2021 1103) Pt/caregiver will Perform Home Exercise Program:  For increased ROM  For increased strengthening  For improved balance  With Supervision, verbal cues required/provided   Talbot Grumbling PT, DPT 02/16/21, 11:04 AM

## 2021-02-16 NOTE — Evaluation (Signed)
Physical Therapy Evaluation Patient Details Name: [name] MRN: [id number] DOB: [birth year] Today's Date: 02/16/2021  History of Present Illness  [name] is a 87 y.o. female presents with fatigue, weakness. CT Chronic atrophic and ischemic changes without acute abnormality. Pt admitted with UTI. PMH: diabetes, HRN, dementia, afib, OSA, RA, CVA, [name], osteopenia, UTI   Clinical Impression  Pt admitted with above diagnosis. Pt pleasant, aware she is in the hospital, able to states she has help at home, lives with son, no steps and ramp to enter, and uses RW to ambulate in the home. Pt currently requiring min A to come to sitting EOB with HOB elevated, min guard to power to stand and min A with limited sidesteps up to [facility]. Pt limited by chronic L knee and LBP complaints. Pt agreeable to HHPT with sons and aide at home to progress back to baseline. DME rec pending home equipment. Pt currently with functional limitations due to the deficits listed below (see PT Problem List). Pt will benefit from skilled PT to increase their independence and safety with mobility to allow discharge to the venue listed below.          Recommendations for follow up therapy are one component of a multi-disciplinary discharge planning process, led by the attending physician.  Recommendations may be updated based on patient status, additional functional criteria and insurance authorization.  Follow Up Recommendations Home health PT;Supervision/Assistance - 24 hour    Equipment Recommendations  Other (comment) (TBD pending home equipment)    Recommendations for Other Services       Precautions / Restrictions Precautions Precautions: Fall Restrictions Weight Bearing Restrictions: No      Mobility  Bed Mobility Overal bed mobility: Needs Assistance Bed Mobility: Supine to Sit;Sit to Supine  Supine to sit: Min assist;HOB elevated Sit to supine: Min guard   General bed mobility comments: min A to  lift trunk into sitting with elevated HOB and use of bedrail, requires increased time, min guard to return to supine    Transfers Overall transfer level: Needs assistance Equipment used: Rolling walker (2 wheeled) Transfers: Sit to/from Stand Sit to Stand: Min guard  General transfer comment: min guard to steady with powering to stand, BLE braced against bed, slow to power up  Ambulation/Gait Ambulation/Gait assistance: Min assist Assistive device: Rolling walker (2 wheeled) Gait Pattern/deviations: Step-to pattern;Trunk flexed;Shuffle  General Gait Details: pt takes 4 shuffling, slow sidesteps with bed behind her up to the [facility], maintains trunk flexed, no knee buckling noted, requests to sit down multiple times due to chronic L knee and back pain limiting tolerance  Stairs     Wheelchair Mobility    Modified Rankin (Stroke Patients Only)       Balance Overall balance assessment: Needs assistance Sitting-balance support: Feet supported Sitting balance-Leahy Scale: Fair Sitting balance - Comments: seated EOB   Standing balance support: During functional activity;Bilateral upper extremity supported Standing balance-Leahy Scale: Poor Standing balance comment: reliant on UE support       Pertinent Vitals/Pain Pain Assessment: Faces Faces Pain Scale: Hurts little more Pain Location: L knee and back Pain Descriptors / Indicators: Discomfort ("chronic") Pain Intervention(s): Limited activity within patient's tolerance;Monitored during session;Patient requesting pain meds-RN notified;Repositioned    Home Living Family/patient expects to be discharged to:: Private residence Living Arrangements: Alone Available Help at Discharge: Family;Personal care attendant;Available 24 hours/day (aide M-F and family assisting)   Home Access: Ramped entrance  Home Layout: One level Home Equipment: Dan Humphreys -  2 wheels Additional Comments: Pt poor historian, reports living with son, has aide  to assist, one level home and uses RW to ambulate short distance.    Prior Function Level of Independence: Needs assistance   Gait / Transfers Assistance Needed: pt reports ambulating limited distances with RW, supv for safety  ADL's / Homemaking Assistance Needed: pt reports aide assists pt with bathing and dressing; family and aide complete household chores        Hand Dominance   Dominant Hand: Right    Extremity/Trunk Assessment   Upper Extremity Assessment Upper Extremity Assessment: Defer to OT evaluation    Lower Extremity Assessment Lower Extremity Assessment: Generalized weakness;RLE deficits/detail;LLE deficits/detail RLE Deficits / Details: AROM WNL, strength grossly 3+/5, denies numbness/tingling RLE Sensation: WNL RLE Coordination: WNL LLE Deficits / Details: AROM WNL, knee with active quad set, SAQ lacks ~10 deg extension, difficulty performing SLR, strength grossly 3+/5, denies numbness/tingling LLE Sensation: WNL LLE Coordination: WNL    Cervical / Trunk Assessment Cervical / Trunk Assessment: Kyphotic  Communication   Communication: No difficulties  Cognition Arousal/Alertness: Awake/alert Behavior During Therapy: WFL for tasks assessed/performed Overall Cognitive Status: History of cognitive impairments - at baseline           General Comments      Exercises     Assessment/Plan    PT Assessment Patient needs continued PT services  PT Problem List Decreased strength;Decreased range of motion;Decreased activity tolerance;Decreased balance;Decreased mobility;Obesity;Pain       PT Treatment Interventions DME instruction;Gait training;Functional mobility training;Therapeutic activities;Therapeutic exercise;Balance training;Patient/family education    PT Goals (Current goals can be found in the Care Plan section)  Acute Rehab PT Goals Patient Stated Goal: agreeable to HHPT with family support PT Goal Formulation: With patient Time For Goal  Achievement: 03/02/21 Potential to Achieve Goals: Good    Frequency Min 3X/week   Barriers to discharge        Co-evaluation               AM-PAC PT "6 Clicks" Mobility  Outcome Measure Help needed turning from your back to your side while in a flat bed without using bedrails?: A Little Help needed moving from lying on your back to sitting on the side of a flat bed without using bedrails?: A Little Help needed moving to and from a bed to a chair (including a wheelchair)?: A Little Help needed standing up from a chair using your arms (e.g., wheelchair or bedside chair)?: A Little Help needed to walk in hospital room?: A Lot Help needed climbing 3-5 steps with a railing? : Total 6 Click Score: 15    End of Session Equipment Utilized During Treatment: Gait belt Activity Tolerance: Patient tolerated treatment well;Patient limited by pain Patient left: in bed;with call bell/phone within reach;with bed alarm set;with nursing/sitter in room Nurse Communication: Mobility status PT Visit Diagnosis: Unsteadiness on feet (R26.81);Other abnormalities of gait and mobility (R26.89);Muscle weakness (generalized) (M62.81);Difficulty in walking, not elsewhere classified (R26.2)    Time: 5784-6962 PT Time Calculation (min) (ACUTE ONLY): 25 min   Charges:   PT Evaluation $PT Eval Low Complexity: 1 Low PT Treatments $Therapeutic Activity: 8-22 mins         Tori Jakye Mullens PT, DPT 02/16/21, 11:01 AM

## 2021-02-16 NOTE — Progress Notes (Signed)
PROGRESS NOTE    [name]  WGN:[id number] DOB: 1934/04/13 DOA: 02/13/2021 PCP: Donita Brooks, MD    Brief Narrative:  [name] was admitted to the hospital with the working diagnosis of AKI and acute metabolic encephalopathy in the setting if urinary tract infection.     87 year old female past medical history for abdominal arctic aneurysm, chronic hypoxic respiratory failure, atrial fibrillation, depression, type II diabetes mellitus, dyslipidemia, hypertension and dementia who presented with altered mental status.  Patient was noted to have dizziness upon standing and difficulty walking.  Over the last 2 to 3 days leading to hospitalization she had decreased p.o. intake. Because of persistent symptoms she was brought to the hospital for further evaluation.  On her initial physical examination she was afebrile, heart rate 70-91, respiratory rate 11-19, blood pressure 83/66, 115/97, oxygen saturation 99%.  Patient was awake and alert x2, her lungs were clear to auscultation bilaterally, heart S1-S2, present, rhythmic, soft abdomen, no lower extremity edema.   ABG pH 7.57, PCO2 35, PO2 133, bicarb 33.8.  Sodium 135, potassium 2.8, chloride 87, bicarb 33, glucose 161, BUN 24, creatinine 1.50, white count 9.0, hemoglobin 14.7, hematocrit 46.5, platelets 217. SARS COVID-19 negative.   Urinalysis specific gravity 1.015, 0-5 red cells, 11-20 white cells.   Head CT negative for acute changes. CT of the abdomen pelvis with 4.3 cm abdominal arctic aneurysm.  Hepatic steatosis.  Diverticulosis.  No diverticulitis.   Chest radiograph mild cardiomegaly, bilateral atelectasis..   EKG 88 bpm, normal axis, normal QTC, atrial fibrillation rhythm, no significant ST segment or T wave changes.  Urine culture positive for E Coli ESBL. She has been responding well to meropenem. Plan to complete at least 5 days of antibiotic therapy in the hospital.  She will return home with home health services  under the supervision of her son.   Assessment & Plan:   Active Problems:   AKI (acute kidney injury) (HCC)   Dehydration   Lactic acidosis   Hypokalemia   Metabolic alkalosis   Acute metabolic encephalopathy     Acute metabolic encephalopathy in the setting of urinary tract infection. (No sepsis)  Mentation continue to improve, PT has recommended home health services.  Urine culture positive for E coli >100,000 CFU, ESBL.   She has been responding well to antibiotic therapy, will plan to complete 5 days in the hospital of IV antibiotic therapy (10/ 21/22)  2. AKI, hypokalemia, hypomagnesemia  Patient with improvement in po intake, IV fluids have been discontinued. Renal function with serum cr at 1.0 with K at 4,0 and serum bicarbonate at 30.   3. Dementia, anxiety and depression.  Her mentation has improved and is close to baseline per her daughter at the bedside Continue with alprazolam and duloxetine  Continue with lower dose of gabapentin to prevent encephalopathy.    4. Dyslipidemia. On rosuvastatin    5. HTN. (Hypotension) Continue to hold atenolol due to risk of hypotension. Systolic blood pressure 100 to 90.   Patient continue to be at high risk for worsening infection  Status is: Inpatient  Remains inpatient appropriate because:  IV antibiotic therapy for multidrug resistant gram negative infection.   DVT prophylaxis: Enoxaparin   Code Status:    DNR   Family Communication:  I spoke with patient's daughter at the bedside, we talked in detail about patient's condition, plan of care and prognosis and all questions were addressed.     Antimicrobials:  Meropenem.  Subjective: Patient with no nausea or vomiting, no dyspnea or chest pain, no abdominal pain, Continue to be very weak and deconditioned   Objective: Vitals:   02/15/21 1950 02/16/21 0629 02/16/21 0931 02/16/21 1405  BP: 119/74 114/72 94/65 90/62   Pulse: 84 70 92 87  Resp: 18 18  20   Temp:  98.6 F (37 C) 98.4 F (36.9 C)  98.6 F (37 C)  TempSrc: Oral   Oral  SpO2: 99% 99%  97%  Weight:      Height:        Intake/Output Summary (Last 24 hours) at 02/16/2021 1421 Last data filed at 02/16/2021 0900 Gross per 24 hour  Intake 780 ml  Output 2950 ml  Net -2170 ml   Filed Weights   02/13/21 1607  Weight: 74.4 kg    Examination:   General: deconditioned  Neurology: Awake and alert, non focal  E ENT: no pallor, no icterus, oral mucosa moist Cardiovascular: No JVD. S1-S2 present, rhythmic, no gallops, rubs, or murmurs. Trace non pitting bilateral lower extremity edema. Pulmonary: positive breath sounds bilaterally, adequate air movement, no wheezing, rhonchi or rales. Gastrointestinal. Abdomen soft and non tender Skin. No rashes Musculoskeletal: no joint deformities     Data Reviewed: I have personally reviewed following labs and imaging studies  CBC: Recent Labs  Lab 02/13/21 1641 02/14/21 0503 02/15/21 0615  WBC 9.0 8.3 5.6  NEUTROABS 6.6 4.8  --   HGB 14.7 12.6 11.8*  HCT 46.5* 41.7 40.3  MCV 90.1 91.9 93.7  PLT 217 145* 137*   Basic Metabolic Panel: Recent Labs  Lab 02/13/21 1641 02/14/21 0503 02/15/21 0615 02/16/21 0618  NA 135 136 138 137  K 2.8* 3.2* 4.5 4.0  CL 87* 95* 102 100  CO2 33* 31 30 30   GLUCOSE 161* 99 114* 130*  BUN 24* 21 19 17   CREATININE 1.50* 1.22* 1.14* 1.05*  CALCIUM 8.8* 7.8* 8.5* 8.9  MG  --  1.4* 1.7  --   PHOS  --   --  3.1  --    GFR: Estimated Creatinine Clearance: 39.6 mL/min (A) (by C-G formula based on SCr of 1.05 mg/dL (H)). Liver Function Tests: Recent Labs  Lab 02/13/21 1641 02/14/21 0503 02/15/21 0615  AST 34 22 22  ALT 18 15 13   ALKPHOS 69 53 51  BILITOT 1.6* 0.6 1.0  PROT 7.9 6.4* 6.2*  ALBUMIN 3.9 3.2* 3.1*   No results for input(s): LIPASE, AMYLASE in the last 168 hours. Recent Labs  Lab 02/13/21 2141  AMMONIA <10   Coagulation Profile: Recent Labs  Lab 02/13/21 1641  INR 1.1    Cardiac Enzymes: No results for input(s): CKTOTAL, CKMB, CKMBINDEX, TROPONINI in the last 168 hours. BNP (last 3 results) No results for input(s): PROBNP in the last 8760 hours. HbA1C: No results for input(s): HGBA1C in the last 72 hours. CBG: No results for input(s): GLUCAP in the last 168 hours. Lipid Profile: No results for input(s): CHOL, HDL, LDLCALC, TRIG, CHOLHDL, LDLDIRECT in the last 72 hours. Thyroid Function Tests: Recent Labs    02/14/21 0503  TSH 0.734   Anemia Panel: No results for input(s): VITAMINB12, FOLATE, FERRITIN, TIBC, IRON, RETICCTPCT in the last 72 hours.    Radiology Studies: I have reviewed all of the imaging during this hospital visit personally     Scheduled Meds:  aspirin EC  81 mg Oral Daily   DULoxetine  60 mg Oral See admin instructions   gabapentin  100  mg Oral TID   heparin  5,000 Units Subcutaneous Q8H   rosuvastatin  40 mg Oral Daily   Continuous Infusions:  meropenem (MERREM) IV 1 g (02/16/21 0935)     LOS: 3 days        Anyla Israelson Annett Gula, MD

## 2021-02-17 ENCOUNTER — Telehealth: Payer: Self-pay

## 2021-02-17 LAB — BASIC METABOLIC PANEL
Anion gap: 6 (ref 5–15)
BUN: 18 mg/dL (ref 8–23)
CO2: 31 mmol/L (ref 22–32)
Calcium: 8.5 mg/dL — ABNORMAL LOW (ref 8.9–10.3)
Chloride: 96 mmol/L — ABNORMAL LOW (ref 98–111)
Creatinine, Ser: 0.98 mg/dL (ref 0.44–1.00)
GFR, Estimated: 57 mL/min — ABNORMAL LOW (ref >60)
Glucose, Bld: 114 mg/dL — ABNORMAL HIGH (ref 70–99)
Potassium: 4 mmol/L (ref 3.5–5.1)
Sodium: 133 mmol/L — ABNORMAL LOW (ref 135–145)

## 2021-02-17 LAB — CBC
HCT: 37 % (ref 36.0–46.0)
Hemoglobin: 11.4 g/dL — ABNORMAL LOW (ref 12.0–15.0)
MCH: 28.1 pg (ref 26.0–34.0)
MCHC: 30.8 g/dL (ref 30.0–36.0)
MCV: 91.1 fL (ref 80.0–100.0)
Platelets: 149 10*3/uL — ABNORMAL LOW (ref 150–400)
RBC: 4.06 MIL/uL (ref 3.87–5.11)
RDW: 14.2 % (ref 11.5–15.5)
WBC: 5.5 10*3/uL (ref 4.0–10.5)
nRBC: 0 % (ref 0.0–0.2)

## 2021-02-17 MED ORDER — ATENOLOL 25 MG PO TABS: 12.5000 mg | ORAL_TABLET | Freq: Every day | ORAL | 1 refills | Status: DC

## 2021-02-17 MED ORDER — FOSFOMYCIN TROMETHAMINE 3 G PO PACK
3.0000 g | PACK | Freq: Once | ORAL | Status: AC
  Administered 2021-02-17: 3 g via ORAL
  Filled 2021-02-17: qty 3

## 2021-02-17 MED ORDER — ATENOLOL 25 MG PO TABS
12.5000 mg | ORAL_TABLET | Freq: Every day | ORAL | Status: DC
  Administered 2021-02-17: 12.5 mg via ORAL
  Filled 2021-02-17: qty 1

## 2021-02-17 NOTE — Evaluation (Signed)
Occupational Therapy Evaluation Patient Details Name: [name] MRN: [id number] DOB: 31-Jan-1934 Today's Date: 02/17/2021   History of Present Illness [name] is a 87 y.o. female presents with fatigue, weakness. CT Chronic atrophic and ischemic changes without acute abnormality. Pt admitted with UTI. PMH: diabetes, HRN, dementia, afib, OSA, RA, CVA, [name], osteopenia, UTI   Clinical Impression   Pt reported that she did not sleep well and was feeling tired. Pt demonstrates need for moderate assist for lower body dressing at bed level and min A for supine to sit bed mobility. Once in bed pt demonstrates fair seated balance. Slight assist needed for initial sit to stand from EOB with min G for lateral steps using RW. Pt reports that she has 24/7 assist at home and is assisted for ADL's at baseline. Pt left in bed with bed alarm set and call bell within reach. Pt will benefit from continued OT in the hospital and recommended venue below to increase strength, balance, and endurance for safe ADL's.        Recommendations for follow up therapy are one component of a multi-disciplinary discharge planning process, led by the attending physician.  Recommendations may be updated based on patient status, additional functional criteria and insurance authorization.   Follow Up Recommendations  Home health OT;Supervision/Assistance - 24 hour    Equipment Recommendations  Tub/shower seat           Precautions / Restrictions Precautions Precautions: Fall Restrictions Weight Bearing Restrictions: No      Mobility Bed Mobility Overal bed mobility: Needs Assistance Bed Mobility: Supine to Sit;Sit to Supine     Supine to sit: Min assist;HOB elevated Sit to supine: Min guard   General bed mobility comments: Single hand held assist to lift trunk from elvated HOB.    Transfers Overall transfer level: Needs assistance Equipment used: Rolling walker (2 wheeled) Transfers: Sit  to/from Stand Sit to Stand: Min guard;Min assist         General transfer comment: Very slight assist with boost from sit to stand using RW. Pt able to take lateral steps to head of bed with MIn G assist.    Balance Overall balance assessment: Needs assistance Sitting-balance support: Feet supported Sitting balance-Leahy Scale: Fair Sitting balance - Comments: seated EOB   Standing balance support: During functional activity;Bilateral upper extremity supported Standing balance-Leahy Scale: Poor Standing balance comment: using RW                           ADL either performed or assessed with clinical judgement   ADL Overall ADL's : Needs assistance/impaired                     Lower Body Dressing: Moderate assistance;Bed level Lower Body Dressing Details (indicate cue type and reason): Pt able to don R sock. Assisted to don L sock at bed level. Toilet Transfer: Minimal assistance;Min Camera operator Details (indicate cue type and reason): Partially simulated via sit to stand and lateral steps at EOB.         Functional mobility during ADLs: Min guard;Rolling walker (Lateral steps using RW)       Vision Baseline Vision/History:  (Pt reports needing glasses) Ability to See in Adequate Light: 0 Adequate Patient Visual Report: No change from baseline                  Pertinent Vitals/Pain Pain Assessment: No/denies pain  Hand Dominance Right   Extremity/Trunk Assessment Upper Extremity Assessment Upper Extremity Assessment: Generalized weakness (Mild L shoulder flexion A/ROM limitation.)   Lower Extremity Assessment Lower Extremity Assessment: Defer to PT evaluation   Cervical / Trunk Assessment Cervical / Trunk Assessment: Kyphotic   Communication Communication Communication: HOH   Cognition Arousal/Alertness: Awake/alert Behavior During Therapy: WFL for tasks assessed/performed Overall Cognitive Status:  History of cognitive impairments - at baseline                                                      Home Living Family/patient expects to be discharged to:: Private residence Living Arrangements: Alone Available Help at Discharge: Family;Personal care attendant;Available 24 hours/day Type of Home: Mobile home Home Access: Ramped entrance     Home Layout: One level     Bathroom Shower/Tub: Chief Strategy Officer: Standard Bathroom Accessibility: Yes   Home Equipment: Walker - 2 wheels   Additional Comments: History taken from review of PT documentation.      Prior Functioning/Environment Level of Independence: Needs assistance  Gait / Transfers Assistance Needed: pt reports ambulating limited distances with RW, supv for safety ADL's / Homemaking Assistance Needed: pt reports aide assists pt with bathing and dressing; family and aide complete household chores   Comments: Prior function taken from document review of PT note.        OT Problem List: Decreased strength;Decreased range of motion;Decreased activity tolerance;Impaired balance (sitting and/or standing)      OT Treatment/Interventions: Self-care/ADL training;Therapeutic exercise;Therapeutic activities;DME and/or AE instruction;Patient/family education;Balance training;Cognitive remediation/compensation    OT Goals(Current goals can be found in the care plan section) Acute Rehab OT Goals Patient Stated Goal: return home OT Goal Formulation: With patient Time For Goal Achievement: 03/03/21 Potential to Achieve Goals: Good  OT Frequency: Min 2X/week    End of Session Equipment Utilized During Treatment: Rolling walker  Activity Tolerance: Patient tolerated treatment well Patient left: in bed;with call bell/phone within reach;with bed alarm set  OT Visit Diagnosis: Unsteadiness on feet (R26.81);Muscle weakness (generalized) (M62.81);Other abnormalities of gait and mobility  (R26.89)                Time: 0272-5366 OT Time Calculation (min): 13 min Charges:  OT General Charges $OT Visit: 1 Visit OT Evaluation $OT Eval Low Complexity: 1 Low  Evanell Redlich OT, MOT  Danie Chandler 02/17/2021, 10:33 AM

## 2021-02-17 NOTE — Telephone Encounter (Signed)
Transition Care Management Follow-up Telephone Call Date of discharge and from where: 02/17/2021 [facility] Diagnosis: TOC. D/C 02/17/2021 [facility]. Dx: Acute Metabolic Encephalopathy/UTI/No Sepsis.  How have you been since you were released from the hospital? Spoke with pt's son. He states she is doing well and resting.  Any questions or concerns? No  Items Reviewed: Did the pt receive and understand the discharge instructions provided? Yes  Medications obtained and verified? Yes  Other? No  Any new allergies since your discharge? No  Dietary orders reviewed? Yes Do you have support at home? Yes   Home Care and Equipment/Supplies: Were home health services ordered? yes If so, what is the name of the agency? [facility]  Has the agency set up a time to come to the patient's home? Not yet per pt's son Audelia Acton Were any new equipment or medical supplies ordered?  No What is the name of the medical supply agency? N/a  Do you have any questions related to the use of the equipment or supplies? No  Functional Questionnaire: (I = Independent and D = Dependent) ADLs: D  Bathing/Dressing- D  Meal Prep- D  Eating- D  Maintaining continence- D  Transferring/Ambulation- D  Managing Meds- D  Follow up appointments reviewed:  PCP Hospital f/u appt confirmed? Yes  Scheduled to see Dr. Dennard Schaumann on 02/23/21 @ 12:15. [facility] f/u appt confirmed? No  Are transportation arrangements needed? No  If their condition worsens, is the pt aware to call PCP or go to the Emergency Dept.? Yes Was the patient provided with contact information for the PCP's office or ED? Yes Was to pt encouraged to call back with questions or concerns? Yes

## 2021-02-17 NOTE — TOC Transition Note (Addendum)
Transition of Care [facility]) - CM/SW Discharge Note   Patient Details  Name: [name] MRN: [id number] Date of Birth: 1933/08/18  [facility] CM/SW Contact:  Yvonne Arnt, [name] Phone Number: 02/17/2021, 1:30 PM   Clinical Narrative:  Pt d/c today. [name] with [name] notified for home health PT. Order in. [name] discussed with pt's son, [name]. He asked about shower bench. Per [employer], insurance will not cover and it is $60 out of pocket. [name] states he will look for one on his own.  [name] asked about outpatient sleep study. Per MD, pt will need to discuss with PCP. Shane aware.     Final next level of care: [place] Barriers to Discharge: Barriers Resolved   Patient Goals and CMS Choice Patient states their goals for this hospitalization and ongoing recovery are:: go home CMS Medicare.gov Compare Post Acute Care list provided to:: Patient Represenative (must comment) Choice offered to / list presented to : Adult Children  Discharge Placement                  Name of family member notified: [name]- son Patient and family notified of of transfer: 02/17/21  Discharge Plan and Services In-house Referral: Clinical Social Work   Post Acute Care Choice: Home Health          DME Arranged: N/A         HH Arranged: PT HH Agency: [place] Date [facility] Agency Contacted: 02/17/21 Time [place]: [id number] Representative spoke with at [place]: [place] ([place]) Interventions     Readmission Risk Interventions Readmission Risk Prevention Plan 02/16/2021  Transportation Screening Complete  HRI or [place] Complete  Social Work Consult for [place] Planning/Counseling Complete  Palliative Care Screening Not Applicable  Medication Review Press photographer) Complete  Some recent data might be hidden

## 2021-02-17 NOTE — Discharge Summary (Signed)
Physician Discharge Summary  [name] WKM:[id number] DOB: 08-31-1933 DOA: 02/13/2021  PCP: [name], MD  Admit date: 02/13/2021 Discharge date: 02/17/2021  Admitted From: Home Disposition:  Home   Recommendations for Outpatient Follow-up:  Follow up with PCP in 1-2 weeks Please obtain BMP/CBC in one week   Home Health: HHPT   Discharge Condition: Stable CODE STATUS: DNR Diet recommendation: Regular   Brief/Interim Summary: 87 year old female past medical history for abdominal arctic aneurysm, chronic hypoxic respiratory failure, atrial fibrillation, depression, type II diabetes mellitus, dyslipidemia, hypertension and dementia who presented with altered mental status.  Patient was noted to have dizziness upon standing and difficulty walking.  Over the last 2 to 3 days leading to hospitalization she had decreased p.o. intake. Because of persistent symptoms she was brought to the hospital for further evaluation.  On her initial physical examination she was afebrile, heart rate 70-91, respiratory rate 11-19, blood pressure 83/66, 115/97, oxygen saturation 99%.  Patient was awake and alert x2, her lungs were clear to auscultation bilaterally, heart S1-S2, present, rhythmic, soft abdomen, no lower extremity edema.   ABG pH 7.57, PCO2 35, PO2 133, bicarb 33.8.  Sodium 135, potassium 2.8, chloride 87, bicarb 33, glucose 161, BUN 24, creatinine 1.50, white count 9.0, hemoglobin 14.7, hematocrit 46.5, platelets 217. SARS COVID-19 negative.   Urinalysis specific gravity 1.015, 0-5 red cells, 11-20 white cells.   Head CT negative for acute changes. CT of the abdomen pelvis with 4.3 cm abdominal arctic aneurysm.  Hepatic steatosis.  Diverticulosis.  No diverticulitis.   Chest radiograph mild cardiomegaly, bilateral atelectasis..   EKG 88 bpm, normal axis, normal QTC, atrial fibrillation rhythm, no significant ST segment or T wave changes.   Urine culture positive for E  Coli ESBL. She has been responding well to meropenem. Plan to complete at least 5 days of antibiotic therapy in the hospital.  She will return home with home health services under the supervision of her son.   Discharge Diagnoses:     Acute metabolic encephalopathy in the setting of urinary tract infection. (No sepsis)  Mentation continue to improve, PT has recommended home health services.  Urine culture positive for E coli >100,000 CFU, ESBL.  -mental status at baseline at time of d/c   She has been responding well to antibiotic therapy -she had 4 days of merrem -02/17/21--give one time dose fosfomycin   2. AKI, hypokalemia, hypomagnesemia  Patient with improvement in po intake, IV fluids have been discontinued. -baseline creatinine 0.6-0.9 -serum creatinine peaked 1.50 -serum creatinine 1/05 at time of d/c   3. Dementia, anxiety and depression.  Her mentation has improved and is close to baseline per her daughter at the bedside Continue with alprazolam and duloxetine  Continue with lower dose of gabapentin to prevent encephalopathy.    4. Dyslipidemia. On rosuvastatin    5. HTN. (Hypotension) Continue to hold atenolol due to risk of hypotension>>restart low dose 12.5 mg daily at time of d/c Systolic blood pressure 116 to 90.    Patient continue to be at high risk for worsening infection  Allergies as of 02/17/2021       Reactions   Sulfonamide Derivatives Hives, Rash        Medication List     STOP taking these medications    bisacodyl 10 MG suppository Commonly known as: DULCOLAX   cephALEXin 500 MG capsule Commonly known as: KEFLEX   fluticasone 50 MCG/ACT nasal spray Commonly known as: FLONASE   Mucinex  600 MG 12 hr tablet Generic drug: guaiFENesin   mupirocin ointment 2 % Commonly known as: BACTROBAN   nitrofurantoin (macrocrystal-monohydrate) 100 MG capsule Commonly known as: Macrobid   senna-docusate 8.6-50 MG tablet Commonly known as:  Senokot-S       TAKE these medications    acetaminophen 325 MG tablet Commonly known as: TYLENOL Take 2 tablets (650 mg total) by mouth every 6 (six) hours.   albuterol 108 (90 Base) MCG/ACT inhaler Commonly known as: VENTOLIN HFA Inhale 2 puffs into the lungs every 4 (four) hours as needed for wheezing or shortness of breath.   ALPRAZolam 0.25 MG tablet Commonly known as: XANAX Take 1 tablet (0.25 mg total) by mouth 2 (two) times daily as needed for anxiety.   ammonium lactate 12 % cream Commonly known as: AMLACTIN Apply topically as needed for dry skin. What changed:  how much to take Another medication with the same name was removed. Continue taking this medication, and follow the directions you see here.   aspirin EC 81 MG tablet Take 1 po BID x 6 weeks then resume 1 po daily What changed:  how much to take how to take this when to take this additional instructions   atenolol 25 MG tablet Commonly known as: TENORMIN Take 0.5 tablets (12.5 mg total) by mouth daily. What changed:  medication strength how much to take   Calcium Carbonate-Vitamin D3 600-400 MG-UNIT Tabs Take 1 tablet by mouth daily.   diclofenac sodium 1 % Gel Commonly known as: VOLTAREN Apply 2 g topically 4 (four) times daily. Rub into affected area of foot 2 to 4 times daily   DULoxetine 60 MG capsule Commonly known as: CYMBALTA TAKE 1 CAPSULE BY MOUTH DAILY ALONG WITH 30 MG FOR TOTAL DAILY DOSE OF 90 MG What changed: See the new instructions.   DULoxetine 30 MG capsule Commonly known as: CYMBALTA TAKE 1 CAPSULE BY MOUTH DAILY ALONG WITH 60 MG FOR TOTAL DAILY DOSE OF 90 MG What changed: See the new instructions.   furosemide 40 MG tablet Commonly known as: LASIX Take 1 tablet (40 mg total) by mouth daily.   gabapentin 800 MG tablet Commonly known as: NEURONTIN TAKE 1 TABLET BY MOUTH 3 TIMES DAILY What changed: Another medication with the same name was removed. Continue taking this  medication, and follow the directions you see here.   ketoconazole 2 % cream Commonly known as: NIZORAL APPLY TOPICALLY DAILY *FOLLOW UP WITH ADAM MCDONALD [phone]* What changed: See the new instructions.   oxyCODONE 5 MG immediate release tablet Commonly known as: Oxy IR/ROXICODONE Take 5 mg by mouth every 6 (six) hours as needed for moderate pain.   pantoprazole 40 MG tablet Commonly known as: PROTONIX TAKE 1 TABLET BY MOUTH ONCE DAILY   polyethylene glycol 17 g packet Commonly known as: MIRALAX / GLYCOLAX Take 17 g by mouth daily as needed for mild constipation or moderate constipation.   rosuvastatin 40 MG tablet Commonly known as: CRESTOR TAKE 1 TABLET BY MOUTH EACH EVENING What changed: See the new instructions.        Allergies  Allergen Reactions   Sulfonamide Derivatives Hives and Rash    Consultations: none   Procedures/Studies: CT HEAD WO CONTRAST (5MM)  Result Date: 02/13/2021 CLINICAL DATA:  Altered mental status and weakness EXAM: CT HEAD WITHOUT CONTRAST TECHNIQUE: Contiguous axial images were obtained from the base of the skull through the vertex without intravenous contrast. COMPARISON:  08/25/2020 FINDINGS: Brain: No evidence of acute  infarction, hemorrhage, hydrocephalus, extra-axial collection or mass lesion/mass effect. Chronic atrophic changes and chronic white matter ischemic changes are seen. Vascular: Vascular calcifications are noted in the middle cerebral arteries bilaterally. Skull: Normal. Negative for fracture or focal lesion. Sinuses/Orbits: No acute finding. Other: None. IMPRESSION: Chronic atrophic and ischemic changes without acute abnormality. Electronically Signed   By: Inez Catalina M.D.   On: 02/13/2021 20:46   CT ABDOMEN PELVIS W CONTRAST  Result Date: 02/13/2021 CLINICAL DATA:  Abdominal pain, acute, nonlocalized EXAM: CT ABDOMEN AND PELVIS WITH CONTRAST TECHNIQUE: Multidetector CT imaging of the abdomen and pelvis was  performed using the standard protocol following bolus administration of intravenous contrast. CONTRAST:  31mL OMNIPAQUE IOHEXOL 300 MG/ML  SOLN COMPARISON:  02/24/2018 FINDINGS: Lower chest: Cardiomegaly. Aortic atherosclerosis and diffuse coronary artery disease. No acute abnormality. Hepatobiliary: Diffuse low-density throughout the liver throughout the liver compatible with fatty infiltration. Prior cholecystectomy. Common bile duct is dilated measuring 10 mm likely related to age and post cholecystectomy state. No focal hepatic abnormality. Pancreas: No focal abnormality or ductal dilatation. Spleen: No focal abnormality.  Normal size. Adrenals/Urinary Tract: Right lower pole renal cyst. No stones or hydronephrosis. Adrenal glands and urinary bladder unremarkable. Stomach/Bowel: Left colonic diverticulosis. No active diverticulitis. Stomach and small bowel decompressed, unremarkable. Vascular/Lymphatic: 4.3 cm abdominal aortic aneurysm compared to 4 cm previously. No adenopathy. Reproductive: Prior hysterectomy.  No adnexal masses. Other: No free fluid or free air. Musculoskeletal: Severe degenerative changes throughout the lumbar spine. Severe compression fracture at T12, stable. Moderate compression fracture at L5, stable. IMPRESSION: 4.3 cm abdominal aortic aneurysm compared to 4 cm in 2019. Hepatic steatosis. Coronary artery disease, aortic atherosclerosis. Left colonic diverticulosis.  No active diverticulitis. No acute findings. Electronically Signed   By: Rolm Baptise M.D.   On: 02/13/2021 19:09   DG Chest Port 1 View  Result Date: 02/13/2021 CLINICAL DATA:  Questionable sepsis.  Weakness. EXAM: PORTABLE CHEST 1 VIEW COMPARISON:  12/29/2020 FINDINGS: Stable cardiomediastinal contours. Aorta is calcified and tortuous. No focal airspace consolidation, pleural effusion, or pneumothorax. IMPRESSION: No active disease. Electronically Signed   By: Davina Poke D.O.   On: 02/13/2021 17:08         Discharge Exam: Vitals:   02/16/21 2056 02/17/21 0503  BP: 119/69 111/75  Pulse: 100 (!) 108  Resp: 18 18  Temp: 98.4 F (36.9 C) 98.3 F (36.8 C)  SpO2: 94% 97%   Vitals:   02/16/21 0931 02/16/21 1405 02/16/21 2056 02/17/21 0503  BP: 94/65 90/62 119/69 111/75  Pulse: 92 87 100 (!) 108  Resp:  20 18 18   Temp:  98.6 F (37 C) 98.4 F (36.9 C) 98.3 F (36.8 C)  TempSrc:  Oral Oral   SpO2:  97% 94% 97%  Weight:      Height:        General: Pt is alert, awake, not in acute distress Cardiovascular: RRR, S1/S2 +, no rubs, no gallops Respiratory: CTA bilaterally, no wheezing, no rhonchi Abdominal: Soft, NT, ND, bowel sounds + Extremities: no edema, no cyanosis   The results of significant diagnostics from this hospitalization (including imaging, microbiology, ancillary and laboratory) are listed below for reference.    Significant Diagnostic Studies: CT HEAD WO CONTRAST (5MM)  Result Date: 02/13/2021 CLINICAL DATA:  Altered mental status and weakness EXAM: CT HEAD WITHOUT CONTRAST TECHNIQUE: Contiguous axial images were obtained from the base of the skull through the vertex without intravenous contrast. COMPARISON:  08/25/2020 FINDINGS: Brain: No evidence of  acute infarction, hemorrhage, hydrocephalus, extra-axial collection or mass lesion/mass effect. Chronic atrophic changes and chronic white matter ischemic changes are seen. Vascular: Vascular calcifications are noted in the middle cerebral arteries bilaterally. Skull: Normal. Negative for fracture or focal lesion. Sinuses/Orbits: No acute finding. Other: None. IMPRESSION: Chronic atrophic and ischemic changes without acute abnormality. Electronically Signed   By: Inez Catalina M.D.   On: 02/13/2021 20:46   CT ABDOMEN PELVIS W CONTRAST  Result Date: 02/13/2021 CLINICAL DATA:  Abdominal pain, acute, nonlocalized EXAM: CT ABDOMEN AND PELVIS WITH CONTRAST TECHNIQUE: Multidetector CT imaging of the abdomen and pelvis  was performed using the standard protocol following bolus administration of intravenous contrast. CONTRAST:  62mL OMNIPAQUE IOHEXOL 300 MG/ML  SOLN COMPARISON:  02/24/2018 FINDINGS: Lower chest: Cardiomegaly. Aortic atherosclerosis and diffuse coronary artery disease. No acute abnormality. Hepatobiliary: Diffuse low-density throughout the liver throughout the liver compatible with fatty infiltration. Prior cholecystectomy. Common bile duct is dilated measuring 10 mm likely related to age and post cholecystectomy state. No focal hepatic abnormality. Pancreas: No focal abnormality or ductal dilatation. Spleen: No focal abnormality.  Normal size. Adrenals/Urinary Tract: Right lower pole renal cyst. No stones or hydronephrosis. Adrenal glands and urinary bladder unremarkable. Stomach/Bowel: Left colonic diverticulosis. No active diverticulitis. Stomach and small bowel decompressed, unremarkable. Vascular/Lymphatic: 4.3 cm abdominal aortic aneurysm compared to 4 cm previously. No adenopathy. Reproductive: Prior hysterectomy.  No adnexal masses. Other: No free fluid or free air. Musculoskeletal: Severe degenerative changes throughout the lumbar spine. Severe compression fracture at T12, stable. Moderate compression fracture at L5, stable. IMPRESSION: 4.3 cm abdominal aortic aneurysm compared to 4 cm in 2019. Hepatic steatosis. Coronary artery disease, aortic atherosclerosis. Left colonic diverticulosis.  No active diverticulitis. No acute findings. Electronically Signed   By: Rolm Baptise M.D.   On: 02/13/2021 19:09   DG Chest Port 1 View  Result Date: 02/13/2021 CLINICAL DATA:  Questionable sepsis.  Weakness. EXAM: PORTABLE CHEST 1 VIEW COMPARISON:  12/29/2020 FINDINGS: Stable cardiomediastinal contours. Aorta is calcified and tortuous. No focal airspace consolidation, pleural effusion, or pneumothorax. IMPRESSION: No active disease. Electronically Signed   By: Davina Poke D.O.   On: 02/13/2021 17:08     Microbiology: Recent Results (from the past 240 hour(s))  Blood Culture (routine x 2)     Status: None (Preliminary result)   Collection Time: 02/13/21  4:42 PM   Specimen: Left Antecubital; Blood  Result Value Ref Range Status   Specimen Description LEFT ANTECUBITAL  Final   Special Requests Immunocompromised  Final   Culture   Final    NO GROWTH 4 DAYS Performed at [facility], [address], [place], [place] [address]    Report Status PENDING  Incomplete  Blood Culture (routine x 2)     Status: None (Preliminary result)   Collection Time: 02/13/21  4:50 PM   Specimen: BLOOD RIGHT HAND  Result Value Ref Range Status   Specimen Description BLOOD RIGHT HAND  Final   Special Requests Immunocompromised  Final   Culture   Final    NO GROWTH 4 DAYS Performed at [facility], [address] [place], [place] [address]    Report Status PENDING  Incomplete  Urine Culture     Status: Abnormal   Collection Time: 02/13/21  7:04 PM   Specimen: Urine, Clean Catch  Result Value Ref Range Status   Specimen Description   Final    URINE, CLEAN CATCH Performed at [facility], [address], [place], [place] [address]  Special Requests   Final    NONE Performed at [facility], [address], [place], [place] [address]    Culture (A)  Final    >=100,000 COLONIES/mL ESCHERICHIA COLI Confirmed Extended Spectrum Beta-Lactamase Producer (ESBL).  In bloodstream infections from ESBL organisms, carbapenems are preferred over piperacillin/tazobactam. They are shown to have a lower risk of mortality.    Report Status 02/16/2021 FINAL  Final   Organism ID, Bacteria ESCHERICHIA COLI (A)  Final      Susceptibility   Escherichia coli - MIC*    AMPICILLIN >=32 RESISTANT Resistant     CEFAZOLIN >=64 RESISTANT Resistant     CEFEPIME 16 RESISTANT Resistant     CEFTRIAXONE >=64 RESISTANT Resistant     CIPROFLOXACIN >=4 RESISTANT Resistant     GENTAMICIN <=1 SENSITIVE Sensitive     IMIPENEM  <=0.25 SENSITIVE Sensitive     NITROFURANTOIN <=16 SENSITIVE Sensitive     TRIMETH/SULFA >=320 RESISTANT Resistant     AMPICILLIN/SULBACTAM >=32 RESISTANT Resistant     PIP/TAZO 64 INTERMEDIATE Intermediate     * >=100,000 COLONIES/mL ESCHERICHIA COLI  Resp Panel by RT-PCR (Flu A&B, Covid) Nasopharyngeal Swab     Status: None   Collection Time: 02/13/21  9:12 PM   Specimen: Nasopharyngeal Swab; Nasopharyngeal(NP) swabs in vial transport medium  Result Value Ref Range Status   SARS Coronavirus 2 by RT PCR NEGATIVE NEGATIVE Final    Comment: (NOTE) SARS-CoV-2 target nucleic acids are NOT DETECTED.  The SARS-CoV-2 RNA is generally detectable in upper respiratory specimens during the acute phase of infection. The lowest concentration of SARS-CoV-2 viral copies this assay can detect is 138 copies/mL. A negative result does not preclude SARS-Cov-2 infection and should not be used as the sole basis for treatment or other patient management decisions. A negative result may occur with  improper specimen collection/handling, submission of specimen other than nasopharyngeal swab, presence of viral mutation(s) within the areas targeted by this assay, and inadequate number of viral copies(<138 copies/mL). A negative result must be combined with clinical observations, patient history, and epidemiological information. The expected result is Negative.  Fact Sheet for Patients:  EntrepreneurPulse.com.au  Fact Sheet for Healthcare Providers:  IncredibleEmployment.be  This test is no t yet approved or cleared by the [place] FDA and  has been authorized for detection and/or diagnosis of SARS-CoV-2 by FDA under an Emergency Use Authorization (EUA). This EUA will remain  in effect (meaning this test can be used) for the duration of the COVID-19 declaration under Section 564(b)(1) of the Act, 21 U.S.C.section 360bbb-3(b)(1), unless the authorization is  terminated  or revoked sooner.       Influenza A by PCR NEGATIVE NEGATIVE Final   Influenza B by PCR NEGATIVE NEGATIVE Final    Comment: (NOTE) The Xpert Xpress SARS-CoV-2/FLU/RSV plus assay is intended as an aid in the diagnosis of influenza from Nasopharyngeal swab specimens and should not be used as a sole basis for treatment. Nasal washings and aspirates are unacceptable for Xpert Xpress SARS-CoV-2/FLU/RSV testing.  Fact Sheet for Patients: EntrepreneurPulse.com.au  Fact Sheet for Healthcare Providers: IncredibleEmployment.be  This test is not yet approved or cleared by the [place] FDA and has been authorized for detection and/or diagnosis of SARS-CoV-2 by FDA under an Emergency Use Authorization (EUA). This EUA will remain in effect (meaning this test can be used) for the duration of the COVID-19 declaration under Section 564(b)(1) of the Act, 21 U.S.C. section 360bbb-3(b)(1), unless the authorization is terminated or  revoked.  Performed at [facility], [address], [place], [place] [address]      Labs: Basic Metabolic Panel: Recent Labs  Lab 02/13/21 1641 02/14/21 0503 02/15/21 0615 02/16/21 0618 02/17/21 0553  NA 135 136 138 137 133*  K 2.8* 3.2* 4.5 4.0 4.0  CL 87* 95* 102 100 96*  CO2 33* 31 30 30 31   GLUCOSE 161* 99 114* 130* 114*  BUN 24* 21 19 17 18   CREATININE 1.50* 1.22* 1.14* 1.05* 0.98  CALCIUM 8.8* 7.8* 8.5* 8.9 8.5*  MG  --  1.4* 1.7  --   --   PHOS  --   --  3.1  --   --    Liver Function Tests: Recent Labs  Lab 02/13/21 1641 02/14/21 0503 02/15/21 0615  AST 34 22 22  ALT 18 15 13   ALKPHOS 69 53 51  BILITOT 1.6* 0.6 1.0  PROT 7.9 6.4* 6.2*  ALBUMIN 3.9 3.2* 3.1*   No results for input(s): LIPASE, AMYLASE in the last 168 hours. Recent Labs  Lab 02/13/21 2141  AMMONIA <10   CBC: Recent Labs  Lab 02/13/21 1641 02/14/21 0503 02/15/21 0615 02/17/21 0553  WBC 9.0 8.3 5.6 5.5   NEUTROABS 6.6 4.8  --   --   HGB 14.7 12.6 11.8* 11.4*  HCT 46.5* 41.7 40.3 37.0  MCV 90.1 91.9 93.7 91.1  PLT 217 145* 137* 149*   Cardiac Enzymes: No results for input(s): CKTOTAL, CKMB, CKMBINDEX, TROPONINI in the last 168 hours. BNP: Invalid input(s): POCBNP CBG: No results for input(s): GLUCAP in the last 168 hours.  Time coordinating discharge:  36 minutes  Signed:  Orson Eva, DO [facility] Pager: [phone] 02/17/2021, 12:53 PM

## 2021-02-17 NOTE — Plan of Care (Signed)
  Problem: Acute Rehab OT Goals (only OT should resolve) Goal: Pt. Will Perform Grooming Flowsheets (Taken 02/17/2021 1035) Pt Will Perform Grooming:  standing  with supervision  with adaptive equipment Goal: Pt. Will Perform Lower Body Dressing Flowsheets (Taken 02/17/2021 1035) Pt Will Perform Lower Body Dressing:  with min assist  sitting/lateral leans  sit to/from stand  with adaptive equipment Goal: Pt. Will Transfer To Toilet Flowsheets (Taken 02/17/2021 1035) Pt Will Transfer to Toilet:  with supervision  stand pivot transfer Goal: Pt/Caregiver Will Perform Home Exercise Program Flowsheets (Taken 02/17/2021 1035) Pt/caregiver will Perform Home Exercise Program:  Increased strength  Both right and left upper extremity  With Supervision  Juelz Claar OT, MOT

## 2021-02-18 LAB — CULTURE, BLOOD (ROUTINE X 2)
Culture: NO GROWTH
Culture: NO GROWTH

## 2021-02-19 ENCOUNTER — Telehealth: Payer: Self-pay | Admitting: *Deleted

## 2021-02-19 ENCOUNTER — Other Ambulatory Visit: Payer: Self-pay | Admitting: *Deleted

## 2021-02-19 MED ORDER — METFORMIN HCL ER 500 MG PO TB24: 1000.0000 mg | ORAL_TABLET | Freq: Every day | ORAL | 3 refills | Status: DC

## 2021-02-19 NOTE — Telephone Encounter (Signed)
Received call from [place], [facility] PT with [facility] [phone]~ telephone.   Reports that patient was hospitalized with dehydration and weakness on 02/13/2021. States that she has returned home with [place] orders.   Requested VO for HH PT 1x weekly x1 week, 2x weekly x3 weeks for home safety, transfers, and strengthening. VO given.   Also reports that patient BP noted at 90/68. States that patient is asymptomatic. Reports that Hospital did decrease Atenolol to 25mg  (1/2) tab PO QD.

## 2021-02-22 NOTE — Telephone Encounter (Signed)
Call placed to patient son, [name]. [place].   Call placed to [facility] and he was made aware. States that he is seeing patient on 02/23/2021 and will make them aware if he cannot reach them today.

## 2021-02-23 ENCOUNTER — Ambulatory Visit (INDEPENDENT_AMBULATORY_CARE_PROVIDER_SITE_OTHER): Payer: 59 | Admitting: Family Medicine

## 2021-02-23 ENCOUNTER — Encounter: Payer: Self-pay | Admitting: Family Medicine

## 2021-02-23 ENCOUNTER — Other Ambulatory Visit: Payer: Self-pay

## 2021-02-23 VITALS — BP 110/64 | HR 90 | Temp 97.8°F | Resp 16

## 2021-02-23 DIAGNOSIS — R4181 Age-related cognitive decline: Secondary | ICD-10-CM

## 2021-02-23 DIAGNOSIS — G9341 Metabolic encephalopathy: Secondary | ICD-10-CM | POA: Diagnosis not present

## 2021-02-23 DIAGNOSIS — R0681 Apnea, not elsewhere classified: Secondary | ICD-10-CM

## 2021-02-23 DIAGNOSIS — N3000 Acute cystitis without hematuria: Secondary | ICD-10-CM | POA: Diagnosis not present

## 2021-02-23 DIAGNOSIS — E860 Dehydration: Secondary | ICD-10-CM | POA: Diagnosis not present

## 2021-02-23 LAB — BASIC METABOLIC PANEL WITH GFR
BUN/Creatinine Ratio: 18 (calc) (ref 6–22)
BUN: 19 mg/dL (ref 7–25)
CO2: 30 mmol/L (ref 20–32)
Calcium: 8.9 mg/dL (ref 8.6–10.4)
Chloride: 95 mmol/L — ABNORMAL LOW (ref 98–110)
Creat: 1.08 mg/dL — ABNORMAL HIGH (ref 0.60–0.95)
Glucose, Bld: 182 mg/dL — ABNORMAL HIGH (ref 65–99)
Potassium: 3.5 mmol/L (ref 3.5–5.3)
Sodium: 136 mmol/L (ref 135–146)
eGFR: 50 mL/min/{1.73_m2} — ABNORMAL LOW (ref >=60)

## 2021-02-23 NOTE — Progress Notes (Signed)
Subjective:    Patient ID: [name], female    DOB: July 18, 1933, 87 y.o.   MRN: [id number]  HPI Admit date: 02/13/2021 Discharge date: 02/17/2021   Admitted From: Home Disposition:  Home    Recommendations for Outpatient Follow-up:  Follow up with PCP in 1-2 weeks Please obtain BMP/CBC in one week     Home Health: [facility]     Discharge Condition: Stable CODE STATUS: DNR Diet recommendation: Regular     Brief/Interim Summary: 87 year old female past medical history for abdominal arctic aneurysm, chronic hypoxic respiratory failure, atrial fibrillation, depression, type II diabetes mellitus, dyslipidemia, hypertension and dementia who presented with altered mental status.  Patient was noted to have dizziness upon standing and difficulty walking.  Over the last 2 to 3 days leading to hospitalization she had decreased p.o. intake. Because of persistent symptoms she was brought to the hospital for further evaluation.  On her initial physical examination she was afebrile, heart rate 70-91, respiratory rate 11-19, blood pressure 83/66, 115/97, oxygen saturation 99%.  Patient was awake and alert x2, her lungs were clear to auscultation bilaterally, heart S1-S2, present, rhythmic, soft abdomen, no lower extremity edema.   ABG pH 7.57, PCO2 35, PO2 133, bicarb 33.8.  Sodium 135, potassium 2.8, chloride 87, bicarb 33, glucose 161, BUN 24, creatinine 1.50, white count 9.0, hemoglobin 14.7, hematocrit 46.5, platelets 217. SARS COVID-19 negative.   Urinalysis specific gravity 1.015, 0-5 red cells, 11-20 white cells.   Head CT negative for acute changes. CT of the abdomen pelvis with 4.3 cm abdominal arctic aneurysm.  Hepatic steatosis.  Diverticulosis.  No diverticulitis.   Chest radiograph mild cardiomegaly, bilateral atelectasis..   EKG 88 bpm, normal axis, normal QTC, atrial fibrillation rhythm, no significant ST segment or T wave changes.   Urine culture positive for E Coli ESBL.  She has been responding well to meropenem. Plan to complete at least 5 days of antibiotic therapy in the hospital.  She will return home with home health services under the supervision of her son.    Discharge Diagnoses:        Acute metabolic encephalopathy in the setting of urinary tract infection. (No sepsis)  Mentation continue to improve, PT has recommended home health services.  Urine culture positive for E coli >100,000 CFU, ESBL.  -mental status at baseline at time of d/c   She has been responding well to antibiotic therapy -she had 4 days of merrem -02/17/21--give one time dose fosfomycin   2. AKI, hypokalemia, hypomagnesemia  Patient with improvement in po intake, IV fluids have been discontinued. -baseline creatinine 0.6-0.9 -serum creatinine peaked 1.50 -serum creatinine 1/05 at time of d/c   3. Dementia, anxiety and depression.  Her mentation has improved and is close to baseline per her daughter at the bedside Continue with alprazolam and duloxetine  Continue with lower dose of gabapentin to prevent encephalopathy.    4. Dyslipidemia. On rosuvastatin    5. HTN. (Hypotension) Continue to hold atenolol due to risk of hypotension>>restart low dose 12.5 mg daily at time of d/c Systolic blood pressure 782 to 90.    02/23/21 Patient is here today for follow-up.  Since discharge from the hospital we have had to discontinue atenolol because of low blood pressures.  She was still having systolic blood pressures in the 90s.  Off the atenolol, her blood pressure today is 956 systolic.  She denies any lightheadedness, syncope, or near syncope.  She states that she is feeling better.  She is less weak.  She denies any dysuria or urgency or frequency.  However both she and her son admit that she is not drinking much.  She is still taking Lasix on a daily basis as a diuretic however there is no swelling in her legs today and no evidence of fluid overload.  She is using oxycodone  sparingly perhaps once a week for back pain.  She is also using alprazolam sparingly maybe 1 or 2 times a week for anxiety.  She does take gabapentin 800 mg 3 times a day for neuropathy but she has been on that for quite some time.  She is back to her base status.  However there is some confusion there because she asked her son if he understands what I am saying.  I believe she is demonstrating age-related cognitive decline.  Her son mentions that they noticed/witnessed several apneic episodes while in the hospital and suggested that she should have a sleep study.  This could also be contributing to her confusion and disorientation.  Past Medical History:  Diagnosis Date   [name] (abdominal aortic aneurysm) 09/2013   3.6 cm   Acute and chronic respiratory failure with hypoxia (HCC)    Acute delirium    Acute encephalopathy    Acute pyelonephritis    Atrial fibrillation (HCC)    Chronic pain syndrome    Constipation    Dementia (HCC)    Dependent edema    bilateral legs    Depression    Diabetes mellitus    pre-diabetes   Diverticulosis    Diverticulosis    Dysuria    E coli bacteremia    Fall    Gross hematuria    Hydronephrosis with renal and ureteral calculous obstruction    Hyperlipidemia    Hypertension    Hypoxemia    Idiopathic peripheral autonomic neuropathy    Infestation by bed bug    Nephrolithiasis    OSA on CPAP    Osteoarthritis    Osteopenia    T=  -2.1 in hip   Persistent mood (affective) disorder, unspecified (HCC)    Prediabetes    Rheumatoid arthritis (HCC)    Rheumatoid arthritis(714.0)    S/P carpal tunnel release    Sepsis secondary to UTI [facility])    Spinal stenosis    Unspecified fracture of fifth lumbar vertebra, initial encounter for closed fracture ([place])    Unspecified fracture of t11-T12 vertebra, initial encounter for closed fracture ([place])    Urinary incontinence    Urinary tract infection    hx of    Urolithiasis    Past Surgical History:   Procedure Laterality Date   APPENDECTOMY     CARPAL TUNNEL RELEASE  12/27/2006   Right subcutaneous ulnar nerve transfer -- Right open carpal tunnel release   CHOLECYSTECTOMY     CYSTOSCOPY W/ URETERAL STENT PLACEMENT Right 01/30/2018   Procedure: CYSTOSCOPY WITH STENT REPLACEMENT retrograde pylegram;  Surgeon: Ardis Hughs, MD;  Location: [facility];  Service: Urology;  Laterality: Right;   CYSTOSCOPY WITH URETEROSCOPY AND STENT PLACEMENT Right 03/01/2018   Procedure: CYSTOSCPY/RIGHT URETEROSCOPY HOLMIUM LASER LITHOTRIPSY AND STENT EXCHANGE;  Surgeon: Ardis Hughs, MD;  Location: [facility];  Service: Urology;  Laterality: Right;   FOOT SURGERY     HIP ARTHROPLASTY Left 08/27/2020   Procedure: ARTHROPLASTY BIPOLAR HIP (HEMIARTHROPLASTY);  Surgeon: Mordecai Rasmussen, MD;  Location: [facility];  Service: Orthopedics;  Laterality: Left;   HOLMIUM LASER APPLICATION Right 69/67/8938  Procedure: HOLMIUM LASER APPLICATION;  Surgeon: Ardis Hughs, MD;  Location: [facility];  Service: Urology;  Laterality: Right;   JOINT REPLACEMENT     BTKR, RTHR   TOTAL KNEE ARTHROPLASTY  08/21/2007   Right total knee replacement   Current Outpatient Medications on File Prior to Visit  Medication Sig Dispense Refill   acetaminophen (TYLENOL) 325 MG tablet Take 2 tablets (650 mg total) by mouth every 6 (six) hours.     albuterol (VENTOLIN HFA) 108 (90 Base) MCG/ACT inhaler Inhale 2 puffs into the lungs every 4 (four) hours as needed for wheezing or shortness of breath.     ALPRAZolam (XANAX) 0.25 MG tablet Take 1 tablet (0.25 mg total) by mouth 2 (two) times daily as needed for anxiety. 14 tablet 0   ammonium lactate (AMLACTIN) 12 % cream Apply topically as needed for dry skin. (Patient taking differently: Apply 1 application topically as needed for dry skin.) 385 g 0   aspirin EC 81 MG tablet Take 1 po BID x 6 weeks then resume 1 po daily (Patient taking differently: Take 81 mg by mouth daily.) 30 tablet 11    Calcium Carbonate-Vitamin D3 600-400 MG-UNIT TABS Take 1 tablet by mouth daily.     diclofenac sodium (VOLTAREN) 1 % GEL Apply 2 g topically 4 (four) times daily. Rub into affected area of foot 2 to 4 times daily 100 g 2   DULoxetine (CYMBALTA) 30 MG capsule TAKE 1 CAPSULE BY MOUTH DAILY ALONG WITH 60 MG FOR TOTAL DAILY DOSE OF 90 MG (Patient taking differently: Take 30 mg by mouth daily.) 20 capsule 10   DULoxetine (CYMBALTA) 60 MG capsule TAKE 1 CAPSULE BY MOUTH DAILY ALONG WITH 30 MG FOR TOTAL DAILY DOSE OF 90 MG (Patient taking differently: Take 60 mg by mouth See admin instructions. Take 60 mg daily with 30 mg capsule for a total of 90 mg daily) 20 capsule 10   furosemide (LASIX) 40 MG tablet Take 1 tablet (40 mg total) by mouth daily. 30 tablet 3   gabapentin (NEURONTIN) 800 MG tablet TAKE 1 TABLET BY MOUTH 3 TIMES DAILY 90 tablet 10   oxyCODONE (OXY IR/ROXICODONE) 5 MG immediate release tablet Take 5 mg by mouth every 6 (six) hours as needed for moderate pain.     pantoprazole (PROTONIX) 40 MG tablet TAKE 1 TABLET BY MOUTH ONCE DAILY (Patient taking differently: Take 40 mg by mouth daily.) 30 tablet 10   polyethylene glycol (MIRALAX / GLYCOLAX) 17 g packet Take 17 g by mouth daily as needed for mild constipation or moderate constipation.     rosuvastatin (CRESTOR) 40 MG tablet TAKE 1 TABLET BY MOUTH EACH EVENING (Patient taking differently: Take 40 mg by mouth daily.) 30 tablet 10   No current facility-administered medications on file prior to visit.   Allergies  Allergen Reactions   Sulfonamide Derivatives Hives and Rash   Social History   Socioeconomic History   Marital status: Divorced    Spouse name: Not on file   Number of children: 6   Years of education: 8th grade   Highest education level: Not on file  Occupational History   Occupation: Retired  Tobacco Use   Smoking status: Never   Smokeless tobacco: Never  Vaping Use   Vaping Use: Never used  Substance and Sexual  Activity   Alcohol use: No   Drug use: No   Sexual activity: Not on file  Other Topics Concern   Not on file  Social History Narrative   Lives at home with 2 sons   She has four living children.   Right-handed.   Two cups caffeine per day.   Social Determinants of Health   Financial Resource Strain: Low Risk    Difficulty of Paying Living Expenses: Not hard at all  Food Insecurity: No Food Insecurity   Worried About Charity fundraiser in the Last Year: Never true   [employer] in the Last Year: Never true  Transportation Needs: No Transportation Needs   Lack of Transportation (Medical): No   Lack of Transportation (Non-Medical): No  Physical Activity: Inactive   Days of Exercise per Week: 0 days   Minutes of Exercise per Session: 0 min  Stress: No Stress Concern Present   Feeling of Stress : Not at all  Social Connections: Socially Isolated   Frequency of Communication with Friends and Family: Once a week   Frequency of Social Gatherings with Friends and Family: More than three times a week   Attends Religious Services: Never   Marine scientist or Organizations: No   Attends Music therapist: Never   Marital Status: Divorced  Human resources officer Violence: Not At Risk   Fear of Current or Ex-Partner: No   Emotionally Abused: No   Physically Abused: No   Sexually Abused: No     Review of Systems  All other systems reviewed and are negative.     Objective:   Physical Exam Constitutional:      General: She is not in acute distress.    Appearance: Normal appearance. She is normal weight. She is not ill-appearing, toxic-appearing or diaphoretic.  Cardiovascular:     Rate and Rhythm: Normal rate. Rhythm irregular.     Heart sounds: Normal heart sounds. No murmur heard.   No gallop.  Pulmonary:     Effort: Pulmonary effort is normal. No tachypnea or respiratory distress.     Breath sounds: Rales present. No wheezing.  Chest:     Chest wall: No  tenderness or edema.  Abdominal:     General: There is no distension.     Tenderness: There is no abdominal tenderness. There is no guarding or rebound.  Musculoskeletal:     Right lower leg: No edema.     Left lower leg: No edema.  Neurological:     Mental Status: She is alert.          Assessment & Plan:  Dehydration - Plan: BASIC METABOLIC PANEL WITH GFR  Acute cystitis without hematuria  Acute metabolic encephalopathy  Age-related cognitive decline  Apnea The rales on her physical exam today are chronic and were present in April and at her last office visit.  I believe this is likely atelectasis.  Patient is showing signs of dehydration and hypotension therefore we should discontinue atenolol and Lasix altogether and encourage the patient to drink more fluid.  Repeat a BMP today to monitor her acute kidney injury and ensure resolution.  Regarding her age-related cognitive decline, she is using alprazolam and oxycodone once or twice a week and therefore I do not feel that this is likely contributing.  Gabapentin is at a high dose and the patient has been on that for quite some time.  At the present time they do not want to decrease the dose of gabapentin because she requires it for her back pain and neuropathy.  However that being said, I will consult neurology given the witnessed apneic episodes  that she experienced in the hospital to consider if she would be a good candidate for sleep study in case this is contributing to some of her cognitive decline and confusion with undiagnosed sleep apnea.  I also encouraged them to change her more frequently when she has any incontinent episodes and to empty her bladder more frequently to avoid urinary tract infections.

## 2021-02-26 DIAGNOSIS — G9341 Metabolic encephalopathy: Secondary | ICD-10-CM | POA: Diagnosis not present

## 2021-02-26 DIAGNOSIS — E860 Dehydration: Secondary | ICD-10-CM | POA: Diagnosis not present

## 2021-02-26 DIAGNOSIS — E876 Hypokalemia: Secondary | ICD-10-CM | POA: Diagnosis not present

## 2021-02-26 DIAGNOSIS — N179 Acute kidney failure, unspecified: Secondary | ICD-10-CM | POA: Diagnosis not present

## 2021-02-26 DIAGNOSIS — I119 Hypertensive heart disease without heart failure: Secondary | ICD-10-CM

## 2021-03-03 ENCOUNTER — Telehealth: Payer: Self-pay | Admitting: *Deleted

## 2021-03-03 NOTE — Telephone Encounter (Signed)
Received call from patient son, [name].   Reports that [facility] SN was out to see patient today and noted BP 130/88, HR 95- 100. HH SN concerned as this was an elevation from the last time patient was seen and requested patient son contact office.   Advised that Atenolol has been stopped due to hypotension. BP and HR will go up without med.   Advised to contact office if BP consistently >140/90, HR >100.

## 2021-03-04 NOTE — Telephone Encounter (Signed)
She ws previously on Atenolol 12.5mg  PO QD. Do you want to resume this dose?

## 2021-03-05 LAB — HM DIABETES EYE EXAM

## 2021-03-05 NOTE — Telephone Encounter (Signed)
Call placed to patient son, Audelia Acton. [place].

## 2021-03-08 MED ORDER — ATENOLOL 25 MG PO TABS: 12.5000 mg | ORAL_TABLET | Freq: Every day | ORAL | 3 refills | Status: DC

## 2021-03-08 NOTE — Telephone Encounter (Signed)
Call placed to patient. LMTRC.  

## 2021-03-08 NOTE — Addendum Note (Signed)
Addended by: Sheral Flow on: 03/08/2021 06:20 PM   Modules accepted: Orders

## 2021-03-08 NOTE — Telephone Encounter (Signed)
Patient son returned call.  Reports that patient BP 140/92 today.   Advised to resume Atenolol 12.5mg  PO QD.  Prescription sent to pharmacy.

## 2021-03-11 NOTE — Progress Notes (Deleted)
Cardiology Office Note   Date:  03/11/2021   ID:  Gavin Faivre, Alferd Apa March 17, 1934, MRN [id number]  PCP:  [name], MD  Cardiologist:  Lubertha South  No chief complaint on file.    History of Present Illness: [name] is a 87 y.o. female who presents for post discharge appointment .She was admitted from 02/13/2021 to 02/17/2021 due to dizziness up standing, AMS, and decreased po intake. . She has past medical history of abdominal arctic aneurysm, chronic hypoxic respiratory failure, atrial fibrillation, chronic diastolic and R-sided heart failure, depression, type II diabetes mellitus, dyslipidemia, hypertension and dementia. She was treated for E-coli UTI.  She was last seen by Dr. Stanford Breed on 08/29/2019 and due to history of chronic falls, she is not a candidate to be placed on anticoagulation. She had another admission in 08/21/2020 for fall and hip injury and was found to be in Afib RVR, and seen on consult by cardiology. No changes were made in her regimen.  Past Medical History:  Diagnosis Date   [name] (abdominal aortic aneurysm) 09/2013   3.6 cm   Acute and chronic respiratory failure with hypoxia (HCC)    Acute delirium    Acute encephalopathy    Acute pyelonephritis    Atrial fibrillation (HCC)    Chronic pain syndrome    Constipation    Dementia (HCC)    Dependent edema    bilateral legs    Depression    Diabetes mellitus    pre-diabetes   Diverticulosis    Diverticulosis    Dysuria    E coli bacteremia    Fall    Gross hematuria    Hydronephrosis with renal and ureteral calculous obstruction    Hyperlipidemia    Hypertension    Hypoxemia    Idiopathic peripheral autonomic neuropathy    Infestation by bed bug    Nephrolithiasis    OSA on CPAP    Osteoarthritis    Osteopenia    T=  -2.1 in hip   Persistent mood (affective) disorder, unspecified (HCC)    Prediabetes    Rheumatoid arthritis (HCC)    Rheumatoid arthritis(714.0)    S/P carpal tunnel  release    Sepsis secondary to UTI [facility])    Spinal stenosis    Unspecified fracture of fifth lumbar vertebra, initial encounter for closed fracture ([place])    Unspecified fracture of t11-T12 vertebra, initial encounter for closed fracture ([place])    Urinary incontinence    Urinary tract infection    hx of    Urolithiasis     Past Surgical History:  Procedure Laterality Date   APPENDECTOMY     CARPAL TUNNEL RELEASE  12/27/2006   Right subcutaneous ulnar nerve transfer -- Right open carpal tunnel release   CHOLECYSTECTOMY     CYSTOSCOPY W/ URETERAL STENT PLACEMENT Right 01/30/2018   Procedure: CYSTOSCOPY WITH STENT REPLACEMENT retrograde pylegram;  Surgeon: Ardis Hughs, MD;  Location: [facility];  Service: Urology;  Laterality: Right;   CYSTOSCOPY WITH URETEROSCOPY AND STENT PLACEMENT Right 03/01/2018   Procedure: CYSTOSCPY/RIGHT URETEROSCOPY HOLMIUM LASER LITHOTRIPSY AND STENT EXCHANGE;  Surgeon: Ardis Hughs, MD;  [facility];  Service: Urology;  Laterality: Right;   FOOT SURGERY     HIP ARTHROPLASTY Left 08/27/2020   Procedure: ARTHROPLASTY BIPOLAR HIP (HEMIARTHROPLASTY);  Surgeon: Mordecai Rasmussen, MD;  Location: [facility];  Service: Orthopedics;  Laterality: Left;   HOLMIUM LASER APPLICATION Right 01/75/1025   Procedure: HOLMIUM LASER APPLICATION;  Surgeon:  Ardis Hughs, MD;  [facility];  Service: Urology;  Laterality: Right;   JOINT REPLACEMENT     BTKR, RTHR   TOTAL KNEE ARTHROPLASTY  08/21/2007   Right total knee replacement     Current Outpatient Medications  Medication Sig Dispense Refill   acetaminophen (TYLENOL) 325 MG tablet Take 2 tablets (650 mg total) by mouth every 6 (six) hours.     albuterol (VENTOLIN HFA) 108 (90 Base) MCG/ACT inhaler Inhale 2 puffs into the lungs every 4 (four) hours as needed for wheezing or shortness of breath.     ALPRAZolam (XANAX) 0.25 MG tablet Take 1 tablet (0.25 mg total) by mouth 2 (two) times daily as needed for  anxiety. 14 tablet 0   ammonium lactate (AMLACTIN) 12 % cream Apply topically as needed for dry skin. (Patient taking differently: Apply 1 application topically as needed for dry skin.) 385 g 0   aspirin EC 81 MG tablet Take 1 po BID x 6 weeks then resume 1 po daily (Patient taking differently: Take 81 mg by mouth daily.) 30 tablet 11   atenolol (TENORMIN) 25 MG tablet Take 0.5 tablets (12.5 mg total) by mouth daily. 15 tablet 3   Calcium Carbonate-Vitamin D3 600-400 MG-UNIT TABS Take 1 tablet by mouth daily.     diclofenac sodium (VOLTAREN) 1 % GEL Apply 2 g topically 4 (four) times daily. Rub into affected area of foot 2 to 4 times daily 100 g 2   DULoxetine (CYMBALTA) 30 MG capsule TAKE 1 CAPSULE BY MOUTH DAILY ALONG WITH 60 MG FOR TOTAL DAILY DOSE OF 90 MG (Patient taking differently: Take 30 mg by mouth daily.) 20 capsule 10   DULoxetine (CYMBALTA) 60 MG capsule TAKE 1 CAPSULE BY MOUTH DAILY ALONG WITH 30 MG FOR TOTAL DAILY DOSE OF 90 MG (Patient taking differently: Take 60 mg by mouth See admin instructions. Take 60 mg daily with 30 mg capsule for a total of 90 mg daily) 20 capsule 10   furosemide (LASIX) 40 MG tablet Take 1 tablet (40 mg total) by mouth daily. 30 tablet 3   gabapentin (NEURONTIN) 800 MG tablet TAKE 1 TABLET BY MOUTH 3 TIMES DAILY 90 tablet 10   oxyCODONE (OXY IR/ROXICODONE) 5 MG immediate release tablet Take 5 mg by mouth every 6 (six) hours as needed for moderate pain.     pantoprazole (PROTONIX) 40 MG tablet TAKE 1 TABLET BY MOUTH ONCE DAILY (Patient taking differently: Take 40 mg by mouth daily.) 30 tablet 10   polyethylene glycol (MIRALAX / GLYCOLAX) 17 g packet Take 17 g by mouth daily as needed for mild constipation or moderate constipation.     rosuvastatin (CRESTOR) 40 MG tablet TAKE 1 TABLET BY MOUTH EACH EVENING (Patient taking differently: Take 40 mg by mouth daily.) 30 tablet 10   No current facility-administered medications for this visit.    Allergies:    Sulfonamide derivatives    Social History:  The patient  reports that she has never smoked. She has never used smokeless tobacco. She reports that she does not drink alcohol and does not use drugs.   Family History:  The patient's family history includes Coronary artery disease in her son; Diabetes in her brother; Heart attack in her son; Heart disease in her son; Hypertension in her maternal aunt and sister; Stroke in her father and mother.    ROS: All other systems are reviewed and negative. Unless otherwise mentioned in H&P    PHYSICAL EXAM: VS:  There were no vitals taken for this visit. , BMI There is no height or weight on file to calculate BMI. GEN: Well nourished, well developed, in no acute distress HEENT: normal Neck: no JVD, carotid bruits, or masses Cardiac: ***RRR; no murmurs, rubs, or gallops,no edema  Respiratory:  Clear to auscultation bilaterally, normal work of breathing GI: soft, nontender, nondistended, + BS MS: no deformity or atrophy Skin: warm and dry, no rash Neuro:  Strength and sensation are intact Psych: euthymic mood, full affect   EKG:  EKG {ACTION; IS/IS EXN:[id number]} ordered today. The ekg ordered today demonstrates ***   Recent Labs: 08/25/2020: B Natriuretic Peptide 133.0 02/14/2021: TSH 0.734 02/15/2021: ALT 13; Magnesium 1.7 02/17/2021: Hemoglobin 11.4; Platelets 149 02/23/2021: BUN 19; Creat 1.08; Potassium 3.5; Sodium 136    Lipid Panel    Component Value Date/Time   CHOL 101 12/29/2020 1459   TRIG 103 12/29/2020 1459   HDL 44 (L) 12/29/2020 1459   CHOLHDL 2.3 12/29/2020 1459   VLDL 24 07/10/2019 0305   LDLCALC 38 12/29/2020 1459      Wt Readings from Last 3 Encounters:  02/13/21 164 lb (74.4 kg)  10/23/20 164 lb 8 oz (74.6 kg)  10/07/20 188 lb (85.3 kg)      Other studies Reviewed: ABI's 04/03/2020 Summary:  Right: Resting right ankle-brachial index indicates noncompressible right  lower extremity arteries. The right  toe-brachial index is normal.   Left: Resting left ankle-brachial index indicates noncompressible left  lower extremity arteries. The left toe-brachial index is normal.   Abdominal Aortic Duplex 04/03/2020 Summary:  Abdominal Aorta: There is evidence of abnormal dilatation of the proximal  Abdominal aorta. The largest aortic measurement is 4.1 cm. The largest  aortic diameter remains essentially unchanged compared to prior exam.  Previous diameter measurement was 4.0  cm obtained on 09/11/2019.   Echocardiogram 07/10/2019 1. Left ventricular ejection fraction, by estimation, is 60 to 65%. The  left ventricle has normal function. The left ventricle has no regional  wall motion abnormalities. There is severe left ventricular hypertrophy.  Left ventricular diastolic function  could not be evaluated. Left ventricular diastolic function could not be  evaluated.   2. Right ventricular systolic function is low normal. The right  ventricular size is normal. There is mildly elevated pulmonary artery  systolic pressure.   3. The mitral valve is abnormal. Trivial mitral valve regurgitation.   4. The aortic valve is tricuspid. Aortic valve regurgitation is not  visualized. Mild aortic valve sclerosis is present, with no evidence of  aortic valve stenosis.   5. Aortic dilatation noted. There is mild dilatation of the aortic root  and of the ascending aorta measuring 39 mm.   6. The inferior vena cava is dilated in size with <50% respiratory  variability, suggesting right atrial pressure of 15 mmHg.   Conclusion(s)/Recommendation(s): Findings consistent with Severe LVH and  diastolic dysfunction, consider outpatient amyloidosis workup.   ASSESSMENT AND PLAN:  1.  ***   Current medicines are reviewed at length with the patient today.  I have spent *** dedicated to the care of this patient on the date of this encounter to include pre-visit review of records, assessment, management and  diagnostic testing,with shared decision making.  Labs/ tests ordered today include: *** Phill Myron. West Pugh, ANP, [facility]   03/11/2021 10:55 AM    [facility] [address] Office [phone] Fax [phone]  Notice: This dictation was prepared with Dragon dictation  along with smaller phrase technology. Any transcriptional errors that result from this process are unintentional and may not be corrected upon review.

## 2021-03-12 ENCOUNTER — Ambulatory Visit: Payer: 59 | Admitting: Adult Health

## 2021-03-12 ENCOUNTER — Observation Stay (HOSPITAL_BASED_OUTPATIENT_CLINIC_OR_DEPARTMENT_OTHER)
Admission: EM | Admit: 2021-03-12 | Discharge: 2021-03-16 | Disposition: A | Discharge status: Home Health | Payer: 59 | Attending: Internal Medicine | Admitting: Internal Medicine

## 2021-03-12 ENCOUNTER — Emergency Department (HOSPITAL_BASED_OUTPATIENT_CLINIC_OR_DEPARTMENT_OTHER): Payer: 59 | Admitting: Radiology

## 2021-03-12 ENCOUNTER — Other Ambulatory Visit: Payer: Self-pay

## 2021-03-12 ENCOUNTER — Encounter (HOSPITAL_BASED_OUTPATIENT_CLINIC_OR_DEPARTMENT_OTHER): Payer: Self-pay

## 2021-03-12 ENCOUNTER — Emergency Department (HOSPITAL_BASED_OUTPATIENT_CLINIC_OR_DEPARTMENT_OTHER): Payer: 59

## 2021-03-12 DIAGNOSIS — I5032 Chronic diastolic (congestive) heart failure: Secondary | ICD-10-CM | POA: Diagnosis present

## 2021-03-12 DIAGNOSIS — E785 Hyperlipidemia, unspecified: Secondary | ICD-10-CM | POA: Diagnosis present

## 2021-03-12 DIAGNOSIS — Z79899 Other long term (current) drug therapy: Secondary | ICD-10-CM | POA: Diagnosis not present

## 2021-03-12 DIAGNOSIS — B9620 Unspecified Escherichia coli [E. coli] as the cause of diseases classified elsewhere: Secondary | ICD-10-CM | POA: Insufficient documentation

## 2021-03-12 DIAGNOSIS — G894 Chronic pain syndrome: Secondary | ICD-10-CM | POA: Diagnosis not present

## 2021-03-12 DIAGNOSIS — F0390 Unspecified dementia without behavioral disturbance: Secondary | ICD-10-CM | POA: Insufficient documentation

## 2021-03-12 DIAGNOSIS — Z87442 Personal history of urinary calculi: Secondary | ICD-10-CM

## 2021-03-12 DIAGNOSIS — Z1619 Resistance to other specified beta lactam antibiotics: Secondary | ICD-10-CM | POA: Insufficient documentation

## 2021-03-12 DIAGNOSIS — I130 Hypertensive heart and chronic kidney disease with heart failure and stage 1 through stage 4 chronic kidney disease, or unspecified chronic kidney disease: Secondary | ICD-10-CM | POA: Diagnosis not present

## 2021-03-12 DIAGNOSIS — M069 Rheumatoid arthritis, unspecified: Secondary | ICD-10-CM | POA: Diagnosis present

## 2021-03-12 DIAGNOSIS — Z66 Do not resuscitate: Secondary | ICD-10-CM | POA: Diagnosis not present

## 2021-03-12 DIAGNOSIS — Z79891 Long term (current) use of opiate analgesic: Secondary | ICD-10-CM | POA: Diagnosis not present

## 2021-03-12 DIAGNOSIS — N3000 Acute cystitis without hematuria: Secondary | ICD-10-CM | POA: Diagnosis not present

## 2021-03-12 DIAGNOSIS — R7303 Prediabetes: Secondary | ICD-10-CM | POA: Insufficient documentation

## 2021-03-12 DIAGNOSIS — Z1612 Extended spectrum beta lactamase (ESBL) resistance: Secondary | ICD-10-CM | POA: Diagnosis not present

## 2021-03-12 DIAGNOSIS — E1122 Type 2 diabetes mellitus with diabetic chronic kidney disease: Secondary | ICD-10-CM | POA: Diagnosis present

## 2021-03-12 DIAGNOSIS — N1831 Chronic kidney disease, stage 3a: Secondary | ICD-10-CM | POA: Diagnosis present

## 2021-03-12 DIAGNOSIS — G9340 Encephalopathy, unspecified: Secondary | ICD-10-CM | POA: Diagnosis present

## 2021-03-12 DIAGNOSIS — I10 Essential (primary) hypertension: Secondary | ICD-10-CM | POA: Diagnosis present

## 2021-03-12 DIAGNOSIS — I482 Chronic atrial fibrillation, unspecified: Secondary | ICD-10-CM | POA: Diagnosis not present

## 2021-03-12 DIAGNOSIS — Z8619 Personal history of other infectious and parasitic diseases: Secondary | ICD-10-CM

## 2021-03-12 DIAGNOSIS — Z1611 Resistance to penicillins: Secondary | ICD-10-CM | POA: Insufficient documentation

## 2021-03-12 DIAGNOSIS — M8580 Other specified disorders of bone density and structure, unspecified site: Secondary | ICD-10-CM | POA: Diagnosis present

## 2021-03-12 DIAGNOSIS — Z882 Allergy status to sulfonamides status: Secondary | ICD-10-CM

## 2021-03-12 DIAGNOSIS — G9341 Metabolic encephalopathy: Secondary | ICD-10-CM | POA: Diagnosis not present

## 2021-03-12 DIAGNOSIS — R5381 Other malaise: Secondary | ICD-10-CM | POA: Diagnosis not present

## 2021-03-12 DIAGNOSIS — Z8249 Family history of ischemic heart disease and other diseases of the circulatory system: Secondary | ICD-10-CM

## 2021-03-12 DIAGNOSIS — R2681 Unsteadiness on feet: Secondary | ICD-10-CM | POA: Insufficient documentation

## 2021-03-12 DIAGNOSIS — Z20822 Contact with and (suspected) exposure to covid-19: Secondary | ICD-10-CM | POA: Insufficient documentation

## 2021-03-12 DIAGNOSIS — K5900 Constipation, unspecified: Secondary | ICD-10-CM | POA: Diagnosis present

## 2021-03-12 DIAGNOSIS — R410 Disorientation, unspecified: Secondary | ICD-10-CM | POA: Diagnosis present

## 2021-03-12 DIAGNOSIS — B9689 Other specified bacterial agents as the cause of diseases classified elsewhere: Secondary | ICD-10-CM | POA: Diagnosis not present

## 2021-03-12 DIAGNOSIS — Z833 Family history of diabetes mellitus: Secondary | ICD-10-CM

## 2021-03-12 DIAGNOSIS — Z7982 Long term (current) use of aspirin: Secondary | ICD-10-CM

## 2021-03-12 DIAGNOSIS — A499 Bacterial infection, unspecified: Secondary | ICD-10-CM

## 2021-03-12 DIAGNOSIS — Z823 Family history of stroke: Secondary | ICD-10-CM

## 2021-03-12 LAB — URINALYSIS, ROUTINE W REFLEX MICROSCOPIC
Bilirubin Urine: NEGATIVE
Glucose, UA: NEGATIVE mg/dL
Hgb urine dipstick: NEGATIVE
Ketones, ur: NEGATIVE mg/dL
Nitrite: NEGATIVE
Protein, ur: NEGATIVE mg/dL
Specific Gravity, Urine: 1.017 (ref 1.005–1.030)
pH: 7 (ref 5.0–8.0)

## 2021-03-12 LAB — CBC
HCT: 41.8 % (ref 36.0–46.0)
Hemoglobin: 12.9 g/dL (ref 12.0–15.0)
MCH: 27.7 pg (ref 26.0–34.0)
MCHC: 30.9 g/dL (ref 30.0–36.0)
MCV: 89.9 fL (ref 80.0–100.0)
Platelets: 174 10*3/uL (ref 150–400)
RBC: 4.65 MIL/uL (ref 3.87–5.11)
RDW: 14.2 % (ref 11.5–15.5)
WBC: 5.4 10*3/uL (ref 4.0–10.5)
nRBC: 0 % (ref 0.0–0.2)

## 2021-03-12 LAB — RESP PANEL BY RT-PCR (FLU A&B, COVID) ARPGX2
Influenza A by PCR: NEGATIVE
Influenza B by PCR: NEGATIVE
SARS Coronavirus 2 by RT PCR: NEGATIVE

## 2021-03-12 LAB — COMPREHENSIVE METABOLIC PANEL
ALT: 9 U/L (ref 0–44)
AST: 16 U/L (ref 15–41)
Albumin: 3.7 g/dL (ref 3.5–5.0)
Alkaline Phosphatase: 65 U/L (ref 38–126)
Anion gap: 10 (ref 5–15)
BUN: 15 mg/dL (ref 8–23)
CO2: 30 mmol/L (ref 22–32)
Calcium: 9.3 mg/dL (ref 8.9–10.3)
Chloride: 98 mmol/L (ref 98–111)
Creatinine, Ser: 1 mg/dL (ref 0.44–1.00)
GFR, Estimated: 55 mL/min — ABNORMAL LOW (ref >60)
Glucose, Bld: 98 mg/dL (ref 70–99)
Potassium: 4.5 mmol/L (ref 3.5–5.1)
Sodium: 138 mmol/L (ref 135–145)
Total Bilirubin: 0.8 mg/dL (ref 0.3–1.2)
Total Protein: 7.3 g/dL (ref 6.5–8.1)

## 2021-03-12 MED ORDER — SODIUM CHLORIDE 0.9 % IV SOLN
1.0000 g | Freq: Three times a day (TID) | INTRAVENOUS | Status: DC
  Administered 2021-03-12: 1 g via INTRAVENOUS
  Filled 2021-03-12: qty 1

## 2021-03-12 NOTE — ED Notes (Signed)
Attempted to call report, secretary said to "call back in a few minutes".

## 2021-03-12 NOTE — Progress Notes (Signed)
[facility] will assume care on arrival to accepting facility. Until arrival, care as per EDP. However, [facility] available 24/7 for questions and assistance.   Nursing staff please page [facility] Admits and Consults ([phone]) as soon as the patient arrives to the hospital.  Launa Goedken, DO  

## 2021-03-12 NOTE — ED Notes (Signed)
Called [facility] to transport patient to [facility] 3W room 22

## 2021-03-12 NOTE — ED Notes (Signed)
Patient also complaining if a Headache that began shortly after Triage and Right Flank Pain from the result of a Fall she sustained going to the [facility] a few days PTA.

## 2021-03-12 NOTE — ED Provider Notes (Signed)
[facility] Provider Note  CSN: [id number] Arrival date & time: 03/12/21 1812    History Chief Complaint  Patient presents with   Altered Mental Status    [name] is a 87 y.o. female with history of dementia and recurrent ESBL UTI typically associated with an acute encephalopathy brought to the ED by daughter for change in mental status since earlier today. She lives with two other of her children and has a caregiver with her in the morning. This afternoon, she became confused, did not recognize family members, seeing things/people that are not there. She has not had a fever or vomiting. She is unable to give many details but reports some R lower rib pain that seems to stem from a fall she had several days ago. No known head injury.    Past Medical History:  Diagnosis Date   [name] (abdominal aortic aneurysm) 09/2013   3.6 cm   Acute and chronic respiratory failure with hypoxia (HCC)    Acute delirium    Acute encephalopathy    Acute pyelonephritis    Atrial fibrillation (HCC)    Chronic pain syndrome    Constipation    Dementia (HCC)    Dependent edema    bilateral legs    Depression    Diabetes mellitus    pre-diabetes   Diverticulosis    Diverticulosis    Dysuria    E coli bacteremia    Fall    Gross hematuria    Hydronephrosis with renal and ureteral calculous obstruction    Hyperlipidemia    Hypertension    Hypoxemia    Idiopathic peripheral autonomic neuropathy    Infestation by bed bug    Nephrolithiasis    OSA on CPAP    Osteoarthritis    Osteopenia    T=  -2.1 in hip   Persistent mood (affective) disorder, unspecified (HCC)    Prediabetes    Rheumatoid arthritis (HCC)    Rheumatoid arthritis(714.0)    S/P carpal tunnel release    Sepsis secondary to UTI [facility])    Spinal stenosis    Unspecified fracture of fifth lumbar vertebra, initial encounter for closed fracture ([place])    Unspecified fracture of t11-T12  vertebra, initial encounter for closed fracture ([place])    Urinary incontinence    Urinary tract infection    hx of    Urolithiasis     Past Surgical History:  Procedure Laterality Date   APPENDECTOMY     CARPAL TUNNEL RELEASE  12/27/2006   Right subcutaneous ulnar nerve transfer -- Right open carpal tunnel release   CHOLECYSTECTOMY     CYSTOSCOPY W/ URETERAL STENT PLACEMENT Right 01/30/2018   Procedure: CYSTOSCOPY WITH STENT REPLACEMENT retrograde pylegram;  Surgeon: Ardis Hughs, MD;  [facility];  Service: Urology;  Laterality: Right;   CYSTOSCOPY WITH URETEROSCOPY AND STENT PLACEMENT Right 03/01/2018   Procedure: CYSTOSCPY/RIGHT URETEROSCOPY HOLMIUM LASER LITHOTRIPSY AND STENT EXCHANGE;  Surgeon: Ardis Hughs, MD;  [facility];  Service: Urology;  Laterality: Right;   FOOT SURGERY     HIP ARTHROPLASTY Left 08/27/2020   Procedure: ARTHROPLASTY BIPOLAR HIP (HEMIARTHROPLASTY);  Surgeon: Mordecai Rasmussen, MD;  Location: [facility];  Service: Orthopedics;  Laterality: Left;   HOLMIUM LASER APPLICATION Right 22/63/3354   Procedure: HOLMIUM LASER APPLICATION;  Surgeon: Ardis Hughs, MD;  [facility];  Service: Urology;  Laterality: Right;   JOINT REPLACEMENT     BTKR, RTHR   TOTAL  KNEE ARTHROPLASTY  08/21/2007   Right total knee replacement    Family History  Problem Relation Age of Onset   Stroke Father    Coronary artery disease Son        CABG at age 6   Heart disease Son    Heart attack Son    Stroke Mother    Hypertension Sister    Diabetes Brother    Hypertension Maternal Aunt     Social History   Tobacco Use   Smoking status: Never   Smokeless tobacco: Never  Vaping Use   Vaping Use: Never used  Substance Use Topics   Alcohol use: No   Drug use: No     Home Medications Prior to Admission medications   Medication Sig Start Date End Date Taking? Authorizing Provider  acetaminophen (TYLENOL) 325 MG tablet Take 2 tablets (650 mg  total) by mouth every 6 (six) hours. 09/01/20   Johnson, Clanford L, MD  albuterol (VENTOLIN HFA) 108 (90 Base) MCG/ACT inhaler Inhale 2 puffs into the lungs every 4 (four) hours as needed for wheezing or shortness of breath. 09/02/20   [provider]  ALPRAZolam Duanne Moron) 0.25 MG tablet Take 1 tablet (0.25 mg total) by mouth 2 (two) times daily as needed for anxiety. 09/01/20   Johnson, Clanford L, MD  ammonium lactate (AMLACTIN) 12 % cream Apply topically as needed for dry skin. Patient taking differently: Apply 1 application topically as needed for dry skin. 08/17/18   Trula Slade, DPM  aspirin EC 81 MG tablet Take 1 po BID x 6 weeks then resume 1 po daily Patient taking differently: Take 81 mg by mouth daily. 09/01/20   Johnson, Clanford L, MD  atenolol (TENORMIN) 25 MG tablet Take 0.5 tablets (12.5 mg total) by mouth daily. 03/08/21   [name], MD  Calcium Carbonate-Vitamin D3 600-400 MG-UNIT TABS Take 1 tablet by mouth daily. 02/04/20   [provider]  diclofenac sodium (VOLTAREN) 1 % GEL Apply 2 g topically 4 (four) times daily. Rub into affected area of foot 2 to 4 times daily 08/17/18   Trula Slade, DPM  DULoxetine (CYMBALTA) 30 MG capsule TAKE 1 CAPSULE BY MOUTH DAILY ALONG WITH 60 MG FOR TOTAL DAILY DOSE OF 90 MG Patient taking differently: Take 30 mg by mouth daily. 09/17/20   [name], MD  DULoxetine (CYMBALTA) 60 MG capsule TAKE 1 CAPSULE BY MOUTH DAILY ALONG WITH 30 MG FOR TOTAL DAILY DOSE OF 90 MG Patient taking differently: Take 60 mg by mouth See admin instructions. Take 60 mg daily with 30 mg capsule for a total of 90 mg daily 09/17/20   [name], MD  furosemide (LASIX) 40 MG tablet Take 1 tablet (40 mg total) by mouth daily. 12/29/20   [name], MD  gabapentin (NEURONTIN) 800 MG tablet TAKE 1 TABLET BY MOUTH 3 TIMES DAILY 01/13/21   [name], MD  oxyCODONE (OXY IR/ROXICODONE) 5 MG immediate release tablet Take 5 mg by  mouth every 6 (six) hours as needed for moderate pain. 09/07/20   [provider]  pantoprazole (PROTONIX) 40 MG tablet TAKE 1 TABLET BY MOUTH ONCE DAILY Patient taking differently: Take 40 mg by mouth daily. 12/10/20   [name], MD  polyethylene glycol (MIRALAX / GLYCOLAX) 17 g packet Take 17 g by mouth daily as needed for mild constipation or moderate constipation.    [provider]  rosuvastatin (CRESTOR) 40 MG tablet  TAKE 1 TABLET BY MOUTH EACH EVENING Patient taking differently: Take 40 mg by mouth daily. 08/20/20   [name], MD     Allergies    Sulfonamide derivatives   Review of Systems   Review of Systems Unable to assess due to mental status.    Physical Exam BP 122/90   Pulse 93   Temp 98.1 F (36.7 C) (Oral)   Resp 19   Ht 5\' 5"  (1.651 m)   Wt 74.4 kg   SpO2 92%   BMI 27.29 kg/m   Physical Exam Vitals and nursing note reviewed.  Constitutional:      Appearance: Normal appearance.  HENT:     Head: Normocephalic and atraumatic.     Nose: Nose normal.     Mouth/Throat:     Mouth: Mucous membranes are moist.  Eyes:     Extraocular Movements: Extraocular movements intact.     Conjunctiva/sclera: Conjunctivae normal.  Cardiovascular:     Rate and Rhythm: Normal rate.  Pulmonary:     Effort: Pulmonary effort is normal.     Breath sounds: Normal breath sounds.  Chest:     Chest wall: Tenderness (R lower ribs) present.  Abdominal:     General: Abdomen is flat.     Palpations: Abdomen is soft.     Tenderness: There is no abdominal tenderness.  Musculoskeletal:        General: No swelling. Normal range of motion.     Cervical back: Neck supple.  Skin:    General: Skin is warm and dry.  Neurological:     General: No focal deficit present.     Mental Status: She is alert.     Cranial Nerves: No cranial nerve deficit.     Sensory: No sensory deficit.     Motor: No weakness.  Psychiatric:        Mood and Affect: Mood  normal.     ED Results / Procedures / Treatments   Labs (all labs ordered are listed, but only abnormal results are displayed) Labs Reviewed  COMPREHENSIVE METABOLIC PANEL - Abnormal; Notable for the following components:      Result Value   GFR, Estimated 55 (*)    All other components within normal limits  URINALYSIS, ROUTINE W REFLEX MICROSCOPIC - Abnormal; Notable for the following components:   Leukocytes,Ua TRACE (*)    Bacteria, UA MANY (*)    All other components within normal limits  URINE CULTURE  CBC  CBG MONITORING, ED    EKG None   Radiology DG Ribs Unilateral W/Chest Right  Result Date: 03/12/2021 CLINICAL DATA:  Fall, right lateral rib pain. EXAM: RIGHT RIBS AND CHEST - 3+ VIEW COMPARISON:  Chest x-ray 02/13/2021. FINDINGS: Aorta is ectatic with atherosclerotic calcifications. Heart size is within normal limits. The lungs and costophrenic angles are clear. There is no pneumothorax. No acute right-sided rib fractures are seen. There is a healed right eighth rib fracture. IMPRESSION: 1. No acute right rib fracture. 2. No acute cardiopulmonary process. Electronically Signed   By: Ronney Asters M.D.   On: 03/12/2021 20:11   CT Head Wo Contrast  Result Date: 03/12/2021 CLINICAL DATA:  Mental status change, unknown cause EXAM: CT HEAD WITHOUT CONTRAST TECHNIQUE: Contiguous axial images were obtained from the base of the skull through the vertex without intravenous contrast. COMPARISON:  CT head 02/13/2021. BRAIN: BRAIN Cerebral ventricle sizes are concordant with the degree of cerebral volume loss. Patchy and confluent areas of decreased  attenuation are noted throughout the deep and periventricular white matter of the cerebral hemispheres bilaterally, compatible with chronic microvascular ischemic disease. No evidence of large-territorial acute infarction. No parenchymal hemorrhage. No mass lesion. No extra-axial collection. No mass effect or midline shift. No  hydrocephalus. Basilar cisterns are patent. Empty sella. Vascular: No hyperdense vessel. Atherosclerotic calcifications are present within the cavernous internal carotid and vertebral arteries. Skull: No acute fracture or focal lesion. Sinuses/Orbits: Paranasal sinuses and mastoid air cells are clear. Bilateral lens replacement. Otherwise the orbits are unremarkable. Other: Redemonstration of nonspecific calcifications along the retromandibular fat pad. IMPRESSION: 1. No acute intracranial abnormality. 2. Empty sella. Findings is often a normal anatomic variant but can be associated with idiopathic intracranial hypertension (pseudotumor cerebri). Electronically Signed   By: Iven Finn M.D.   On: 03/12/2021 20:16    Procedures Procedures  Medications Ordered in the ED Medications  meropenem (MERREM) 1 g in sodium chloride 0.9 % 100 mL IVPB (has no administration in time range)     MDM Rules/Calculators/A&P MDM  Patient with presentation similar to previous UTI, known to have ESBL and typically gets admitted for IV Abx. She is otherwise non-toxic appearing, no signs of sepsis. Will check labs, CT head, xray ribs.   ED Course  I have reviewed the triage vital signs and the nursing notes.  Pertinent labs & imaging results that were available during my care of the patient were reviewed by me and considered in my medical decision making (see chart for details).  Clinical Course as of 03/12/21 2153  Fri Mar 12, 2021  1956 CBC and CMP are normal. UA with many bacteria and 6-10 WBC could represent acute UTI in this patient with chronic incontinence. Will await CT head and Rib xray. Send urine for culture.  [CS]  2015 Xray is neg for fracture or acute lung problem.  [CS]  2024 CT neg for acute process.  [CS]  2147 Patient continues to be confused. Will begin Merrem for ESBL, discuss admission with Hospitalist.  [CS]  2151 Spoke with Dr. Bridgett Larsson, Hospitalist, who will accept for admission.  [CS]     Clinical Course User Index [CS] Truddie Hidden, MD    Final Clinical Impression(s) / ED Diagnoses Final diagnoses:  Acute cystitis without hematuria  Acute encephalopathy  ESBL (extended spectrum beta-lactamase) producing bacteria infection    Rx / DC Orders ED Discharge Orders     None        Truddie Hidden, MD 03/12/21 2153

## 2021-03-12 NOTE — ED Notes (Signed)
Patient transported to CT 

## 2021-03-12 NOTE — ED Triage Notes (Signed)
Patient here POV from Home with Daughter for Confusion.  Daughter states the Patient has a History of Dementia in which she is typically A&Ox4 and will intermittently become confused at times.  Daughter states she believes she has a UTI due to History where she'll get a UTI and become increasingly confused which is occurring now since yesterday PM. Family states urine a Strong Odor.  NAD Noted during Triage. BIB Wheelchair. Patient is confused at the time of Triage.

## 2021-03-12 NOTE — ED Notes (Signed)
Patient returned from CT

## 2021-03-12 NOTE — ED Notes (Signed)
Learta Codding- [phone] to be contacted for room assignment.

## 2021-03-12 NOTE — ED Notes (Signed)
Attempted to call report a second time, no response.

## 2021-03-13 ENCOUNTER — Encounter (HOSPITAL_COMMUNITY): Payer: Self-pay | Admitting: Family Medicine

## 2021-03-13 DIAGNOSIS — Z823 Family history of stroke: Secondary | ICD-10-CM | POA: Diagnosis not present

## 2021-03-13 DIAGNOSIS — G9340 Encephalopathy, unspecified: Secondary | ICD-10-CM | POA: Diagnosis not present

## 2021-03-13 DIAGNOSIS — M8580 Other specified disorders of bone density and structure, unspecified site: Secondary | ICD-10-CM | POA: Diagnosis not present

## 2021-03-13 DIAGNOSIS — Z7982 Long term (current) use of aspirin: Secondary | ICD-10-CM | POA: Diagnosis not present

## 2021-03-13 DIAGNOSIS — R7303 Prediabetes: Secondary | ICD-10-CM | POA: Diagnosis not present

## 2021-03-13 DIAGNOSIS — M069 Rheumatoid arthritis, unspecified: Secondary | ICD-10-CM | POA: Diagnosis not present

## 2021-03-13 DIAGNOSIS — I130 Hypertensive heart and chronic kidney disease with heart failure and stage 1 through stage 4 chronic kidney disease, or unspecified chronic kidney disease: Secondary | ICD-10-CM | POA: Diagnosis not present

## 2021-03-13 DIAGNOSIS — G894 Chronic pain syndrome: Secondary | ICD-10-CM | POA: Diagnosis not present

## 2021-03-13 DIAGNOSIS — E785 Hyperlipidemia, unspecified: Secondary | ICD-10-CM | POA: Diagnosis not present

## 2021-03-13 DIAGNOSIS — N1831 Chronic kidney disease, stage 3a: Secondary | ICD-10-CM | POA: Diagnosis not present

## 2021-03-13 DIAGNOSIS — Z79891 Long term (current) use of opiate analgesic: Secondary | ICD-10-CM | POA: Diagnosis not present

## 2021-03-13 DIAGNOSIS — Z8619 Personal history of other infectious and parasitic diseases: Secondary | ICD-10-CM | POA: Diagnosis not present

## 2021-03-13 DIAGNOSIS — G9341 Metabolic encephalopathy: Secondary | ICD-10-CM | POA: Diagnosis not present

## 2021-03-13 DIAGNOSIS — I5032 Chronic diastolic (congestive) heart failure: Secondary | ICD-10-CM | POA: Diagnosis not present

## 2021-03-13 DIAGNOSIS — Z66 Do not resuscitate: Secondary | ICD-10-CM | POA: Diagnosis not present

## 2021-03-13 DIAGNOSIS — R5381 Other malaise: Secondary | ICD-10-CM | POA: Diagnosis not present

## 2021-03-13 DIAGNOSIS — N3000 Acute cystitis without hematuria: Secondary | ICD-10-CM | POA: Diagnosis not present

## 2021-03-13 DIAGNOSIS — E1122 Type 2 diabetes mellitus with diabetic chronic kidney disease: Secondary | ICD-10-CM | POA: Diagnosis not present

## 2021-03-13 DIAGNOSIS — Z833 Family history of diabetes mellitus: Secondary | ICD-10-CM | POA: Diagnosis not present

## 2021-03-13 DIAGNOSIS — Z1611 Resistance to penicillins: Secondary | ICD-10-CM | POA: Diagnosis not present

## 2021-03-13 DIAGNOSIS — Z87442 Personal history of urinary calculi: Secondary | ICD-10-CM | POA: Diagnosis not present

## 2021-03-13 DIAGNOSIS — F0390 Unspecified dementia without behavioral disturbance: Secondary | ICD-10-CM | POA: Diagnosis not present

## 2021-03-13 DIAGNOSIS — Z1619 Resistance to other specified beta lactam antibiotics: Secondary | ICD-10-CM | POA: Diagnosis not present

## 2021-03-13 DIAGNOSIS — R2681 Unsteadiness on feet: Secondary | ICD-10-CM | POA: Diagnosis not present

## 2021-03-13 DIAGNOSIS — B9620 Unspecified Escherichia coli [E. coli] as the cause of diseases classified elsewhere: Secondary | ICD-10-CM | POA: Diagnosis not present

## 2021-03-13 DIAGNOSIS — B9689 Other specified bacterial agents as the cause of diseases classified elsewhere: Secondary | ICD-10-CM | POA: Diagnosis not present

## 2021-03-13 DIAGNOSIS — I482 Chronic atrial fibrillation, unspecified: Secondary | ICD-10-CM | POA: Diagnosis not present

## 2021-03-13 DIAGNOSIS — Z882 Allergy status to sulfonamides status: Secondary | ICD-10-CM | POA: Diagnosis not present

## 2021-03-13 DIAGNOSIS — Z8249 Family history of ischemic heart disease and other diseases of the circulatory system: Secondary | ICD-10-CM | POA: Diagnosis not present

## 2021-03-13 DIAGNOSIS — Z1612 Extended spectrum beta lactamase (ESBL) resistance: Secondary | ICD-10-CM | POA: Diagnosis not present

## 2021-03-13 DIAGNOSIS — R410 Disorientation, unspecified: Secondary | ICD-10-CM | POA: Diagnosis present

## 2021-03-13 DIAGNOSIS — Z79899 Other long term (current) drug therapy: Secondary | ICD-10-CM | POA: Diagnosis not present

## 2021-03-13 DIAGNOSIS — Z20822 Contact with and (suspected) exposure to covid-19: Secondary | ICD-10-CM | POA: Diagnosis not present

## 2021-03-13 LAB — CBC
HCT: 37.8 % (ref 36.0–46.0)
Hemoglobin: 11.9 g/dL — ABNORMAL LOW (ref 12.0–15.0)
MCH: 28.1 pg (ref 26.0–34.0)
MCHC: 31.5 g/dL (ref 30.0–36.0)
MCV: 89.2 fL (ref 80.0–100.0)
Platelets: 163 10*3/uL (ref 150–400)
RBC: 4.24 MIL/uL (ref 3.87–5.11)
RDW: 14.1 % (ref 11.5–15.5)
WBC: 5.3 10*3/uL (ref 4.0–10.5)
nRBC: 0 % (ref 0.0–0.2)

## 2021-03-13 LAB — BASIC METABOLIC PANEL
Anion gap: 7 (ref 5–15)
BUN: 14 mg/dL (ref 8–23)
CO2: 28 mmol/L (ref 22–32)
Calcium: 8.9 mg/dL (ref 8.9–10.3)
Chloride: 98 mmol/L (ref 98–111)
Creatinine, Ser: 0.95 mg/dL (ref 0.44–1.00)
GFR, Estimated: 59 mL/min — ABNORMAL LOW (ref >60)
Glucose, Bld: 117 mg/dL — ABNORMAL HIGH (ref 70–99)
Potassium: 3.8 mmol/L (ref 3.5–5.1)
Sodium: 133 mmol/L — ABNORMAL LOW (ref 135–145)

## 2021-03-13 LAB — TSH: TSH: 2.657 u[IU]/mL (ref 0.350–4.500)

## 2021-03-13 LAB — VITAMIN B12: Vitamin B-12: 307 pg/mL (ref 180–914)

## 2021-03-13 LAB — AMMONIA: Ammonia: 25 umol/L (ref 9–35)

## 2021-03-13 LAB — RPR: RPR Ser Ql: NONREACTIVE

## 2021-03-13 LAB — CBG MONITORING, ED: Glucose-Capillary: 94 mg/dL (ref 70–99)

## 2021-03-13 MED ORDER — ALBUTEROL SULFATE (2.5 MG/3ML) 0.083% IN NEBU: 2.5000 mg | INHALATION_SOLUTION | RESPIRATORY_TRACT | Status: DC | PRN

## 2021-03-13 MED ORDER — ROSUVASTATIN CALCIUM 20 MG PO TABS
40.0000 mg | ORAL_TABLET | Freq: Every day | ORAL | Status: DC
  Administered 2021-03-13 – 2021-03-16 (×4): 40 mg via ORAL
  Filled 2021-03-13 (×4): qty 2

## 2021-03-13 MED ORDER — ENOXAPARIN SODIUM 40 MG/0.4ML IJ SOSY
40.0000 mg | PREFILLED_SYRINGE | INTRAMUSCULAR | Status: DC
  Administered 2021-03-13 – 2021-03-16 (×4): 40 mg via SUBCUTANEOUS
  Filled 2021-03-13 (×4): qty 0.4

## 2021-03-13 MED ORDER — ATENOLOL 25 MG PO TABS
12.5000 mg | ORAL_TABLET | Freq: Every day | ORAL | Status: DC
  Administered 2021-03-13 – 2021-03-16 (×4): 12.5 mg via ORAL
  Filled 2021-03-13 (×4): qty 1

## 2021-03-13 MED ORDER — ASPIRIN EC 81 MG PO TBEC
81.0000 mg | DELAYED_RELEASE_TABLET | Freq: Every day | ORAL | Status: DC
  Administered 2021-03-13 – 2021-03-16 (×4): 81 mg via ORAL
  Filled 2021-03-13 (×4): qty 1

## 2021-03-13 MED ORDER — ACETAMINOPHEN 325 MG PO TABS
650.0000 mg | ORAL_TABLET | Freq: Four times a day (QID) | ORAL | Status: DC | PRN
  Administered 2021-03-13 – 2021-03-14 (×2): 650 mg via ORAL
  Filled 2021-03-13 (×2): qty 2

## 2021-03-13 MED ORDER — PANTOPRAZOLE SODIUM 40 MG PO TBEC
40.0000 mg | DELAYED_RELEASE_TABLET | Freq: Every day | ORAL | Status: DC
  Administered 2021-03-13 – 2021-03-16 (×4): 40 mg via ORAL
  Filled 2021-03-13 (×4): qty 1

## 2021-03-13 MED ORDER — DICLOFENAC SODIUM 1 % EX GEL
2.0000 g | Freq: Four times a day (QID) | CUTANEOUS | Status: DC
  Administered 2021-03-13 – 2021-03-16 (×6): 2 g via TOPICAL
  Filled 2021-03-13: qty 100

## 2021-03-13 MED ORDER — SODIUM CHLORIDE 0.9 % IV SOLN
1.0000 g | Freq: Two times a day (BID) | INTRAVENOUS | Status: DC
  Administered 2021-03-13 – 2021-03-16 (×7): 1 g via INTRAVENOUS
  Filled 2021-03-13 (×8): qty 1

## 2021-03-13 NOTE — Progress Notes (Signed)
Patient arrived to room 2W22 from [facility].  Assessment complete, VS obtained, and Admission database began.

## 2021-03-13 NOTE — H&P (Signed)
History and Physical    [name] MBT:[id number] DOB: 1933-12-29 DOA: 03/12/2021  PCP: [name], MD   Patient coming from: Home   Chief Complaint: Confusion, "I feel sick"   HPI: [name] is a pleasant 87 y.o. female with medical history significant for dementia, atrial fibrillation not anticoagulated, chronic pain, mild renal insufficiency, chronic diastolic CHF, and history of ESBL infections, now presenting to the emergency department for evaluation of confusion.  Family became concerned over the past day when the patient developed waxing and waning confusion, not recognizing them at times.  Patient fell several days ago and had been complaining of some pain in her right side but there was no apparent head injury.  Family noticed a new foul odor from the patient's urine and suspects she has a UTI as she has had similar confusion before in this setting.  Patient states that she has been feeling sick but is unable to elaborate and has trouble answering basic questions due to being easily distracted.  [facility] ED Course: Upon arrival to the ED, patient is found to be afebrile, saturating well on room air, and with blood pressure 150/100.  Chemistry panel and CBC are unremarkable, COVID-19 PCR is negative, and there is no acute finding on head CT or chest x-ray.  Patient was started on meropenem and sent to [facility] for ongoing evaluation and managment.  Review of Systems:  All other systems reviewed and apart from HPI, are negative.  Past Medical History:  Diagnosis Date   [name] (abdominal aortic aneurysm) 09/2013   3.6 cm   Acute and chronic respiratory failure with hypoxia (HCC)    Acute delirium    Acute encephalopathy    Acute pyelonephritis    Atrial fibrillation (HCC)    Chronic pain syndrome    Constipation    Dementia (HCC)    Dependent edema    bilateral legs    Depression    Diabetes mellitus    pre-diabetes   Diverticulosis     Diverticulosis    Dysuria    E coli bacteremia    Fall    Gross hematuria    Hydronephrosis with renal and ureteral calculous obstruction    Hyperlipidemia    Hypertension    Hypoxemia    Idiopathic peripheral autonomic neuropathy    Infestation by bed bug    Nephrolithiasis    OSA on CPAP    Osteoarthritis    Osteopenia    T=  -2.1 in hip   Persistent mood (affective) disorder, unspecified (HCC)    Prediabetes    Rheumatoid arthritis (HCC)    Rheumatoid arthritis(714.0)    S/P carpal tunnel release    Sepsis secondary to UTI [facility])    Spinal stenosis    Unspecified fracture of fifth lumbar vertebra, initial encounter for closed fracture ([place])    Unspecified fracture of t11-T12 vertebra, initial encounter for closed fracture ([place])    Urinary incontinence    Urinary tract infection    hx of    Urolithiasis     Past Surgical History:  Procedure Laterality Date   APPENDECTOMY     CARPAL TUNNEL RELEASE  12/27/2006   Right subcutaneous ulnar nerve transfer -- Right open carpal tunnel release   CHOLECYSTECTOMY     CYSTOSCOPY W/ URETERAL STENT PLACEMENT Right 01/30/2018   Procedure: CYSTOSCOPY WITH STENT REPLACEMENT retrograde pylegram;  Surgeon: Ardis Hughs, MD;  Location: [facility];  Service: Urology;  Laterality: Right;  CYSTOSCOPY WITH URETEROSCOPY AND STENT PLACEMENT Right 03/01/2018   Procedure: CYSTOSCPY/RIGHT URETEROSCOPY HOLMIUM LASER LITHOTRIPSY AND STENT EXCHANGE;  Surgeon: Ardis Hughs, MD;  Location: [facility];  Service: Urology;  Laterality: Right;   FOOT SURGERY     HIP ARTHROPLASTY Left 08/27/2020   Procedure: ARTHROPLASTY BIPOLAR HIP (HEMIARTHROPLASTY);  Surgeon: Mordecai Rasmussen, MD;  Location: [facility];  Service: Orthopedics;  Laterality: Left;   HOLMIUM LASER APPLICATION Right 10/23/7626   Procedure: HOLMIUM LASER APPLICATION;  Surgeon: Ardis Hughs, MD;  Location: [facility];  Service: Urology;  Laterality: Right;   JOINT REPLACEMENT     BTKR, RTHR    TOTAL KNEE ARTHROPLASTY  08/21/2007   Right total knee replacement    Social History:   reports that she has never smoked. She has never used smokeless tobacco. She reports that she does not drink alcohol and does not use drugs.  Allergies  Allergen Reactions   Sulfonamide Derivatives Hives and Rash    Family History  Problem Relation Age of Onset   Stroke Father    Coronary artery disease Son        CABG at age 60   Heart disease Son    Heart attack Son    Stroke Mother    Hypertension Sister    Diabetes Brother    Hypertension Maternal Aunt      Prior to Admission medications   Medication Sig Start Date End Date Taking? Authorizing Provider  acetaminophen (TYLENOL) 325 MG tablet Take 2 tablets (650 mg total) by mouth every 6 (six) hours. 09/01/20   Johnson, Clanford L, MD  albuterol (VENTOLIN HFA) 108 (90 Base) MCG/ACT inhaler Inhale 2 puffs into the lungs every 4 (four) hours as needed for wheezing or shortness of breath. 09/02/20   [provider]  ALPRAZolam Duanne Moron) 0.25 MG tablet Take 1 tablet (0.25 mg total) by mouth 2 (two) times daily as needed for anxiety. 09/01/20   Johnson, Clanford L, MD  ammonium lactate (AMLACTIN) 12 % cream Apply topically as needed for dry skin. Patient taking differently: Apply 1 application topically as needed for dry skin. 08/17/18   Trula Slade, DPM  aspirin EC 81 MG tablet Take 1 po BID x 6 weeks then resume 1 po daily Patient taking differently: Take 81 mg by mouth daily. 09/01/20   Johnson, Clanford L, MD  atenolol (TENORMIN) 25 MG tablet Take 0.5 tablets (12.5 mg total) by mouth daily. 03/08/21   [name], MD  Calcium Carbonate-Vitamin D3 600-400 MG-UNIT TABS Take 1 tablet by mouth daily. 02/04/20   [provider]  diclofenac sodium (VOLTAREN) 1 % GEL Apply 2 g topically 4 (four) times daily. Rub into affected area of foot 2 to 4 times daily 08/17/18   Trula Slade, DPM  DULoxetine (CYMBALTA) 30 MG capsule  TAKE 1 CAPSULE BY MOUTH DAILY ALONG WITH 60 MG FOR TOTAL DAILY DOSE OF 90 MG Patient taking differently: Take 30 mg by mouth daily. 09/17/20   [name], MD  DULoxetine (CYMBALTA) 60 MG capsule TAKE 1 CAPSULE BY MOUTH DAILY ALONG WITH 30 MG FOR TOTAL DAILY DOSE OF 90 MG Patient taking differently: Take 60 mg by mouth See admin instructions. Take 60 mg daily with 30 mg capsule for a total of 90 mg daily 09/17/20   [name], MD  furosemide (LASIX) 40 MG tablet Take 1 tablet (40 mg total) by mouth daily. 12/29/20   [name], MD  gabapentin (  NEURONTIN) 800 MG tablet TAKE 1 TABLET BY MOUTH 3 TIMES DAILY 01/13/21   [name], MD  oxyCODONE (OXY IR/ROXICODONE) 5 MG immediate release tablet Take 5 mg by mouth every 6 (six) hours as needed for moderate pain. 09/07/20   [provider]  pantoprazole (PROTONIX) 40 MG tablet TAKE 1 TABLET BY MOUTH ONCE DAILY Patient taking differently: Take 40 mg by mouth daily. 12/10/20   [name], MD  polyethylene glycol (MIRALAX / GLYCOLAX) 17 g packet Take 17 g by mouth daily as needed for mild constipation or moderate constipation.    [provider]  rosuvastatin (CRESTOR) 40 MG tablet TAKE 1 TABLET BY MOUTH EACH EVENING Patient taking differently: Take 40 mg by mouth daily. 08/20/20   [name], MD    Physical Exam: Vitals:   03/12/21 2300 03/13/21 0000 03/13/21 0050 03/13/21 0405  BP: (!) 142/96 120/73 127/82 105/81  Pulse: 93 97 91 80  Resp: 18 17 17 19   Temp:   98.7 F (37.1 C) 98 F (36.7 C)  TempSrc:   Oral Oral  SpO2: 91% 93% 96% 94%  Weight:      Height:        Constitutional: NAD, calm  Eyes: PERTLA, lids and conjunctivae normal ENMT: Mucous membranes are moist. Posterior pharynx clear of any exudate or lesions.   Neck: supple, no masses  Respiratory: no wheezing, no crackles. No accessory muscle use.  Cardiovascular: S1 & S2 heard, regular rate and rhythm. No extremity edema.   Abdomen: No distension, no tenderness, soft. Bowel sounds active.  Musculoskeletal: no clubbing / cyanosis. No joint deformity upper and lower extremities.   Skin: no significant rashes, lesions, ulcers. Warm, dry, well-perfused. Neurologic: CN 2-12 grossly intact. Moving all extremities. Sleeping, wakes to voice. Oriented to person only.  Psychiatric: Calm. Cooperative.    Labs and Imaging on Admission: I have personally reviewed following labs and imaging studies  CBC: Recent Labs  Lab 03/12/21 1839  WBC 5.4  HGB 12.9  HCT 41.8  MCV 89.9  PLT 789   Basic Metabolic Panel: Recent Labs  Lab 03/12/21 1839  NA 138  K 4.5  CL 98  CO2 30  GLUCOSE 98  BUN 15  CREATININE 1.00  CALCIUM 9.3   GFR: Estimated Creatinine Clearance: 41.6 mL/min (by C-G formula based on SCr of 1 mg/dL). Liver Function Tests: Recent Labs  Lab 03/12/21 1839  AST 16  ALT 9  ALKPHOS 65  BILITOT 0.8  PROT 7.3  ALBUMIN 3.7   No results for input(s): LIPASE, AMYLASE in the last 168 hours. No results for input(s): AMMONIA in the last 168 hours. Coagulation Profile: No results for input(s): INR, PROTIME in the last 168 hours. Cardiac Enzymes: No results for input(s): CKTOTAL, CKMB, CKMBINDEX, TROPONINI in the last 168 hours. BNP (last 3 results) No results for input(s): PROBNP in the last 8760 hours. HbA1C: No results for input(s): HGBA1C in the last 72 hours. CBG: No results for input(s): GLUCAP in the last 168 hours. Lipid Profile: No results for input(s): CHOL, HDL, LDLCALC, TRIG, CHOLHDL, LDLDIRECT in the last 72 hours. Thyroid Function Tests: No results for input(s): TSH, T4TOTAL, FREET4, T3FREE, THYROIDAB in the last 72 hours. Anemia Panel: No results for input(s): VITAMINB12, FOLATE, FERRITIN, TIBC, IRON, RETICCTPCT in the last 72 hours. Urine analysis:    Component Value Date/Time   COLORURINE YELLOW 03/12/2021 1828   APPEARANCEUR CLEAR 03/12/2021 1828   LABSPEC 1.017 03/12/2021  1828  PHURINE 7.0 03/12/2021 1828   GLUCOSEU NEGATIVE 03/12/2021 1828   GLUCOSEU NEG mg/dL 03/08/2007 2031   HGBUR NEGATIVE 03/12/2021 1828   HGBUR negative 03/06/2008 0846   BILIRUBINUR NEGATIVE 03/12/2021 1828   KETONESUR NEGATIVE 03/12/2021 1828   PROTEINUR NEGATIVE 03/12/2021 1828   UROBILINOGEN 0.2 09/20/2013 1535   NITRITE NEGATIVE 03/12/2021 1828   LEUKOCYTESUR TRACE (A) 03/12/2021 1828   Sepsis Labs: @LABRCNTIP (procalcitonin:4,lacticidven:4) ) Recent Results (from the past 240 hour(s))  Resp Panel by RT-PCR (Flu A&B, Covid) Nasopharyngeal Swab     Status: None   Collection Time: 03/12/21 10:17 PM   Specimen: Nasopharyngeal Swab; Nasopharyngeal(NP) swabs in vial transport medium  Result Value Ref Range Status   SARS Coronavirus 2 by RT PCR NEGATIVE NEGATIVE Final    Comment: (NOTE) SARS-CoV-2 target nucleic acids are NOT DETECTED.  The SARS-CoV-2 RNA is generally detectable in upper respiratory specimens during the acute phase of infection. The lowest concentration of SARS-CoV-2 viral copies this assay can detect is 138 copies/mL. A negative result does not preclude SARS-Cov-2 infection and should not be used as the sole basis for treatment or other patient management decisions. A negative result may occur with  improper specimen collection/handling, submission of specimen other than nasopharyngeal swab, presence of viral mutation(s) within the areas targeted by this assay, and inadequate number of viral copies(<138 copies/mL). A negative result must be combined with clinical observations, patient history, and epidemiological information. The expected result is Negative.  Fact Sheet for Patients:  EntrepreneurPulse.com.au  Fact Sheet for Healthcare Providers:  IncredibleEmployment.be  This test is no t yet approved or cleared by the [place] FDA and  has been authorized for detection and/or diagnosis of SARS-CoV-2 by FDA  under an Emergency Use Authorization (EUA). This EUA will remain  in effect (meaning this test can be used) for the duration of the COVID-19 declaration under Section 564(b)(1) of the Act, 21 U.S.C.section 360bbb-3(b)(1), unless the authorization is terminated  or revoked sooner.       Influenza A by PCR NEGATIVE NEGATIVE Final   Influenza B by PCR NEGATIVE NEGATIVE Final    Comment: (NOTE) The Xpert Xpress SARS-CoV-2/FLU/RSV plus assay is intended as an aid in the diagnosis of influenza from Nasopharyngeal swab specimens and should not be used as a sole basis for treatment. Nasal washings and aspirates are unacceptable for Xpert Xpress SARS-CoV-2/FLU/RSV testing.  Fact Sheet for Patients: EntrepreneurPulse.com.au  Fact Sheet for Healthcare Providers: IncredibleEmployment.be  This test is not yet approved or cleared by the [place] FDA and has been authorized for detection and/or diagnosis of SARS-CoV-2 by FDA under an Emergency Use Authorization (EUA). This EUA will remain in effect (meaning this test can be used) for the duration of the COVID-19 declaration under Section 564(b)(1) of the Act, 21 U.S.C. section 360bbb-3(b)(1), unless the authorization is terminated or revoked.  Performed at [facility], [address], [place], [place] [address]      Radiological Exams on Admission: DG Ribs Unilateral W/Chest Right  Result Date: 03/12/2021 CLINICAL DATA:  Fall, right lateral rib pain. EXAM: RIGHT RIBS AND CHEST - 3+ VIEW COMPARISON:  Chest x-ray 02/13/2021. FINDINGS: Aorta is ectatic with atherosclerotic calcifications. Heart size is within normal limits. The lungs and costophrenic angles are clear. There is no pneumothorax. No acute right-sided rib fractures are seen. There is a healed right eighth rib fracture. IMPRESSION: 1. No acute right rib fracture. 2. No acute cardiopulmonary process. Electronically Signed    By: Warren Lacy  Dagoberto Reef M.D.   On: 03/12/2021 20:11   CT Head Wo Contrast  Result Date: 03/12/2021 CLINICAL DATA:  Mental status change, unknown cause EXAM: CT HEAD WITHOUT CONTRAST TECHNIQUE: Contiguous axial images were obtained from the base of the skull through the vertex without intravenous contrast. COMPARISON:  CT head 02/13/2021. BRAIN: BRAIN Cerebral ventricle sizes are concordant with the degree of cerebral volume loss. Patchy and confluent areas of decreased attenuation are noted throughout the deep and periventricular white matter of the cerebral hemispheres bilaterally, compatible with chronic microvascular ischemic disease. No evidence of large-territorial acute infarction. No parenchymal hemorrhage. No mass lesion. No extra-axial collection. No mass effect or midline shift. No hydrocephalus. Basilar cisterns are patent. Empty sella. Vascular: No hyperdense vessel. Atherosclerotic calcifications are present within the cavernous internal carotid and vertebral arteries. Skull: No acute fracture or focal lesion. Sinuses/Orbits: Paranasal sinuses and mastoid air cells are clear. Bilateral lens replacement. Otherwise the orbits are unremarkable. Other: Redemonstration of nonspecific calcifications along the retromandibular fat pad. IMPRESSION: 1. No acute intracranial abnormality. 2. Empty sella. Findings is often a normal anatomic variant but can be associated with idiopathic intracranial hypertension (pseudotumor cerebri). Electronically Signed   By: Iven Finn M.D.   On: 03/12/2021 20:16    Assessment/Plan   1. Acute encephalopathy  - Pt with dementia presents with 1 day of increased confusion and fatigue, similar to when she had a UTI   - No acute findings on head CT in ED and basic labs appear stable  - Urine was cultured and empiric antibiotic started in ED for presumed UTI  - Check ammonia, TSH, and B12, follow urine culture, continue antibiotic, institute delirium precautions    2.  Suspected UTI; hx of ESBL  - No fever or leukocytosis but family note the presentation is very similar to when pt had UTI and she returned to baseline with antibiotics  - Urine sent for culture from ED and meropenem started given hx of recent ESBL    3. Atrial fibrillation  - Not anticoagulated d/t fall risk  - Continue atenolol    4. CKD IIIa  - Appears to be at baseline   5. Chronic pain  - Initially, plan to hold medications that could be contributing to her presentation     DVT prophylaxis: Lovenox  Code Status: DNR  Level of Care: Level of care: Med-Surg Family Communication: none available at time of admission   Disposition Plan:  Patient is from: home  Anticipated d/c is to: TBD Anticipated d/c date is: 03/15/21  Patient currently: Pending urine culture, improvement in mental status  Consults called: none  Admission status: Observation     Vianne Bulls, MD [facility]  03/13/2021, 5:05 AM

## 2021-03-13 NOTE — Progress Notes (Signed)
PROGRESS NOTE    [name]  SJG:[id number] DOB: Nov 21, 1933 DOA: 03/12/2021 PCP: [name], MD   Chief Complaint  Patient presents with   Altered Mental Status   Brief Narrative/Hospital Course:  [name], 87 y.o. female with PMH of  dementia, atrial fibrillation not anticoagulated, chronic pain, mild renal insufficiency, chronic diastolic CHF, and history of ESBL infections presented due to altered mental status over the past day with waxing and waning confusion not recognizing family members also complaining of pain on the right side without obvious injury and was having new fall order in urine and has had similar presentation with UTI in the past.  Seen in the ED vitals are stable CBC BMP stable COVID-19 negative influenza negative CT head and chest x-ray no acute finding no rib fracture.  UA was drawn and showed trace leukocyte esterase WBC 6-10 urine culture was sent and placed on meropenem due to concern for UTI, and with recent ESBL UTI  Subjective: Son at bedside Mentation improving, has " slight dementia" Generalized weakness+ Afebrile overnight  Assessment & Plan:  Acute metabolic encephalopathy in the setting of dementia Dementia: Suspecting UTI contributing to her altered mental status and fatigue.  TSH is stable B12 307, RPR pending ammonia normal.  CT head no acute finding on admission.  Mentation is improving.  Continue on supportive care, delirium precaution.  Suspected UTI with history of ESBL: Continue on meropenem pending further urine culture report.  Mentation improving   Chronic pain syndrome: Holding home meds, resume slowly  HTN Atrial fibrillation, chronic-not on anticoagulation due to fall risk Chronic diastolic CHF: BP well controlled.  Continue atenolol, Crestor and aspirin.  Stage 3a chronic kidney disease-renal function at baseline  DVT prophylaxis: enoxaparin (LOVENOX) injection 40 mg Start: 03/13/21 1000 Code Status:   Code  Status: DNR Family Communication: plan of care discussed with patient and her son at bedside. Status is: admitted as Observation Patient remains hospitalized for ongoing management of acute encephalopathy and UTI, with IV antibiotics pending further urine culture report.  Objective: Vitals last 24 hrs: Vitals:   03/12/21 2300 03/13/21 0000 03/13/21 0050 03/13/21 0405  BP: (!) 142/96 120/73 127/82 105/81  Pulse: 93 97 91 80  Resp: 18 17 17 19   Temp:   98.7 F (37.1 C) 98 F (36.7 C)  TempSrc:   Oral Oral  SpO2: 91% 93% 96% 94%  Weight:      Height:       Weight change:   Intake/Output Summary (Last 24 hours) at 03/13/2021 0839 Last data filed at 03/13/2021 0050 Gross per 24 hour  Intake --  Output 200 ml  Net -200 ml   Net IO Since Admission: -200 mL [03/13/21 0839]   Physical Examination: General exam: AAO to people place. weak,older than stated age. HEENT:Oral mucosa moist, Ear/Nose WNL grossly,dentition normal. Respiratory system: B/l diminished BS, no use of accessory muscle, non tender. Cardiovascular system: S1 & S2 +,No JVD. Gastrointestinal system: Abdomen soft, NT,ND, BS+. Nervous System:Alert, awake, moving extremities. Extremities: edema none, distal peripheral pulses palpable.  Skin: No rashes, no icterus. MSK: Normal muscle bulk, tone, power.  Medications reviewed:  Scheduled Meds:  aspirin EC  81 mg Oral Daily   atenolol  12.5 mg Oral Daily   diclofenac Sodium  2 g Topical QID   enoxaparin (LOVENOX) injection  40 mg Subcutaneous Q24H   pantoprazole  40 mg Oral Daily   rosuvastatin  40 mg Oral Daily   Continuous  Infusions:  meropenem (MERREM) IV      Diet Order             Diet Heart Room service appropriate? Yes with Assist; Fluid consistency: Thin  Diet effective now                  Weight change:   Wt Readings from Last 3 Encounters:  03/12/21 74.4 kg  02/13/21 74.4 kg  10/23/20 74.6 kg    Consultants:see note  Procedures:see  note Antimicrobials: Anti-infectives (From admission, onward)    Start     Dose/Rate Route Frequency Ordered Stop   03/13/21 1000  meropenem (MERREM) 1 g in sodium chloride 0.9 % 100 mL IVPB        1 g 200 mL/hr over 30 Minutes Intravenous Every 12 hours 03/13/21 0108     03/12/21 2200  meropenem (MERREM) 1 g in sodium chloride 0.9 % 100 mL IVPB  Status:  Discontinued        1 g 200 mL/hr over 30 Minutes Intravenous Every 8 hours 03/12/21 2139 03/13/21 0108      Culture/Microbiology    Component Value Date/Time   SDES  02/13/2021 1904    URINE, CLEAN CATCH Performed at [facility], [address], [place], [place] [address]    [facility]  02/13/2021 1904    NONE Performed at [facility], [address] [place], [place] [address]    CULT (A) 02/13/2021 1904    >=100,000 COLONIES/mL ESCHERICHIA COLI Confirmed Extended Spectrum Beta-Lactamase Producer (ESBL).  In bloodstream infections from ESBL organisms, carbapenems are preferred over piperacillin/tazobactam. They are shown to have a lower risk of mortality.    REPTSTATUS 02/16/2021 FINAL 02/13/2021 1904    Other culture-see note  Unresulted Labs (From admission, onward)     Start     Ordered   03/20/21 0500  Creatinine, serum  (enoxaparin (LOVENOX)    CrCl >/= 30 ml/min)  Weekly,   R     Comments: while on enoxaparin therapy   Question:  Specimen collection method  Answer:  Lab=Lab collect   03/13/21 0504   03/13/21 0543  RPR  Add-on,   AD       Question:  Specimen collection method  Answer:  Lab=Lab collect   03/13/21 0542   03/12/21 1940  Urine Culture  Once,   STAT       Question:  Indication  Answer:  Altered mental status (if no other cause identified)   03/12/21 1939           Data Reviewed: I have personally reviewed following labs and imaging studies CBC: Recent Labs  Lab 03/12/21 1839 03/13/21 0608  WBC 5.4 5.3  HGB 12.9 11.9*  HCT 41.8 37.8  MCV 89.9 89.2  PLT 174 076   Basic Metabolic  Panel: Recent Labs  Lab 03/12/21 1839 03/13/21 0608  NA 138 133*  K 4.5 3.8  CL 98 98  CO2 30 28  GLUCOSE 98 117*  BUN 15 14  CREATININE 1.00 0.95  CALCIUM 9.3 8.9   GFR: Estimated Creatinine Clearance: 43.7 mL/min (by C-G formula based on SCr of 0.95 mg/dL). Liver Function Tests: Recent Labs  Lab 03/12/21 1839  AST 16  ALT 9  ALKPHOS 65  BILITOT 0.8  PROT 7.3  ALBUMIN 3.7   No results for input(s): LIPASE, AMYLASE in the last 168 hours. Recent Labs  Lab 03/13/21 0608  AMMONIA 25   Coagulation Profile: No results for input(s):  INR, PROTIME in the last 168 hours. Cardiac Enzymes: No results for input(s): CKTOTAL, CKMB, CKMBINDEX, TROPONINI in the last 168 hours. BNP (last 3 results) No results for input(s): PROBNP in the last 8760 hours. HbA1C: No results for input(s): HGBA1C in the last 72 hours. CBG: Recent Labs  Lab 03/12/21 1836  GLUCAP 94   Lipid Profile: No results for input(s): CHOL, HDL, LDLCALC, TRIG, CHOLHDL, LDLDIRECT in the last 72 hours. Thyroid Function Tests: Recent Labs    03/13/21 0608  TSH 2.657   Anemia Panel: Recent Labs    03/13/21 0608  VITAMINB12 307   Sepsis Labs: No results for input(s): PROCALCITON, LATICACIDVEN in the last 168 hours.  Recent Results (from the past 240 hour(s))  Resp Panel by RT-PCR (Flu A&B, Covid) Nasopharyngeal Swab     Status: None   Collection Time: 03/12/21 10:17 PM   Specimen: Nasopharyngeal Swab; Nasopharyngeal(NP) swabs in vial transport medium  Result Value Ref Range Status   SARS Coronavirus 2 by RT PCR NEGATIVE NEGATIVE Final    Comment: (NOTE) SARS-CoV-2 target nucleic acids are NOT DETECTED.  The SARS-CoV-2 RNA is generally detectable in upper respiratory specimens during the acute phase of infection. The lowest concentration of SARS-CoV-2 viral copies this assay can detect is 138 copies/mL. A negative result does not preclude SARS-Cov-2 infection and should not be used as the sole  basis for treatment or other patient management decisions. A negative result may occur with  improper specimen collection/handling, submission of specimen other than nasopharyngeal swab, presence of viral mutation(s) within the areas targeted by this assay, and inadequate number of viral copies(<138 copies/mL). A negative result must be combined with clinical observations, patient history, and epidemiological information. The expected result is Negative.  Fact Sheet for Patients:  EntrepreneurPulse.com.au  Fact Sheet for Healthcare Providers:  IncredibleEmployment.be  This test is no t yet approved or cleared by the [place] FDA and  has been authorized for detection and/or diagnosis of SARS-CoV-2 by FDA under an Emergency Use Authorization (EUA). This EUA will remain  in effect (meaning this test can be used) for the duration of the COVID-19 declaration under Section 564(b)(1) of the Act, 21 U.S.C.section 360bbb-3(b)(1), unless the authorization is terminated  or revoked sooner.       Influenza A by PCR NEGATIVE NEGATIVE Final   Influenza B by PCR NEGATIVE NEGATIVE Final    Comment: (NOTE) The Xpert Xpress SARS-CoV-2/FLU/RSV plus assay is intended as an aid in the diagnosis of influenza from Nasopharyngeal swab specimens and should not be used as a sole basis for treatment. Nasal washings and aspirates are unacceptable for Xpert Xpress SARS-CoV-2/FLU/RSV testing.  Fact Sheet for Patients: EntrepreneurPulse.com.au  Fact Sheet for Healthcare Providers: IncredibleEmployment.be  This test is not yet approved or cleared by the [place] FDA and has been authorized for detection and/or diagnosis of SARS-CoV-2 by FDA under an Emergency Use Authorization (EUA). This EUA will remain in effect (meaning this test can be used) for the duration of the COVID-19 declaration under Section 564(b)(1) of the Act,  21 U.S.C. section 360bbb-3(b)(1), unless the authorization is terminated or revoked.  Performed at [facility], [address], [place], [place] [address]      Radiology Studies: DG Ribs Unilateral W/Chest Right  Result Date: 03/12/2021 CLINICAL DATA:  Fall, right lateral rib pain. EXAM: RIGHT RIBS AND CHEST - 3+ VIEW COMPARISON:  Chest x-ray 02/13/2021. FINDINGS: Aorta is ectatic with atherosclerotic calcifications. Heart size is within normal limits.  The lungs and costophrenic angles are clear. There is no pneumothorax. No acute right-sided rib fractures are seen. There is a healed right eighth rib fracture. IMPRESSION: 1. No acute right rib fracture. 2. No acute cardiopulmonary process. Electronically Signed   By: Ronney Asters M.D.   On: 03/12/2021 20:11   CT Head Wo Contrast  Result Date: 03/12/2021 CLINICAL DATA:  Mental status change, unknown cause EXAM: CT HEAD WITHOUT CONTRAST TECHNIQUE: Contiguous axial images were obtained from the base of the skull through the vertex without intravenous contrast. COMPARISON:  CT head 02/13/2021. BRAIN: BRAIN Cerebral ventricle sizes are concordant with the degree of cerebral volume loss. Patchy and confluent areas of decreased attenuation are noted throughout the deep and periventricular white matter of the cerebral hemispheres bilaterally, compatible with chronic microvascular ischemic disease. No evidence of large-territorial acute infarction. No parenchymal hemorrhage. No mass lesion. No extra-axial collection. No mass effect or midline shift. No hydrocephalus. Basilar cisterns are patent. Empty sella. Vascular: No hyperdense vessel. Atherosclerotic calcifications are present within the cavernous internal carotid and vertebral arteries. Skull: No acute fracture or focal lesion. Sinuses/Orbits: Paranasal sinuses and mastoid air cells are clear. Bilateral lens replacement. Otherwise the orbits are unremarkable. Other:  Redemonstration of nonspecific calcifications along the retromandibular fat pad. IMPRESSION: 1. No acute intracranial abnormality. 2. Empty sella. Findings is often a normal anatomic variant but can be associated with idiopathic intracranial hypertension (pseudotumor cerebri). Electronically Signed   By: Iven Finn M.D.   On: 03/12/2021 20:16     LOS: 0 days   Antonieta Pert, MD [facility]  03/13/2021, 8:39 AM

## 2021-03-14 DIAGNOSIS — I5032 Chronic diastolic (congestive) heart failure: Secondary | ICD-10-CM | POA: Diagnosis not present

## 2021-03-14 DIAGNOSIS — G9340 Encephalopathy, unspecified: Secondary | ICD-10-CM | POA: Diagnosis not present

## 2021-03-14 DIAGNOSIS — I482 Chronic atrial fibrillation, unspecified: Secondary | ICD-10-CM | POA: Diagnosis not present

## 2021-03-14 DIAGNOSIS — N3000 Acute cystitis without hematuria: Secondary | ICD-10-CM | POA: Diagnosis not present

## 2021-03-14 DIAGNOSIS — I10 Essential (primary) hypertension: Secondary | ICD-10-CM

## 2021-03-14 LAB — URINE CULTURE: Culture: NO GROWTH

## 2021-03-14 MED ORDER — SODIUM CHLORIDE 0.9 % IV SOLN: INTRAVENOUS | Status: DC | PRN

## 2021-03-14 MED ORDER — OXYCODONE HCL 5 MG PO TABS
5.0000 mg | ORAL_TABLET | Freq: Four times a day (QID) | ORAL | Status: DC | PRN
  Administered 2021-03-14: 5 mg via ORAL
  Filled 2021-03-14: qty 1

## 2021-03-14 NOTE — Plan of Care (Signed)
Delirium overnight; self-removing PIVs.   Problem: Education: Goal: Knowledge of General Education information will improve Description: Including pain rating scale, medication(s)/side effects and non-pharmacologic comfort measures Outcome: Progressing   Problem: Health Behavior/Discharge Planning: Goal: Ability to manage health-related needs will improve Outcome: Progressing   Problem: Clinical Measurements: Goal: Ability to maintain clinical measurements within normal limits will improve Outcome: Progressing Goal: Will remain free from infection Outcome: Progressing Goal: Diagnostic test results will improve Outcome: Progressing Goal: Respiratory complications will improve Outcome: Progressing Goal: Cardiovascular complication will be avoided Outcome: Progressing   Problem: Activity: Goal: Risk for activity intolerance will decrease Outcome: Progressing   Problem: Nutrition: Goal: Adequate nutrition will be maintained Outcome: Progressing   Problem: Coping: Goal: Level of anxiety will decrease Outcome: Progressing   Problem: Elimination: Goal: Will not experience complications related to bowel motility Outcome: Progressing Goal: Will not experience complications related to urinary retention Outcome: Progressing   Problem: Pain Managment: Goal: General experience of comfort will improve Outcome: Progressing   Problem: Safety: Goal: Ability to remain free from injury will improve Outcome: Progressing   Problem: Skin Integrity: Goal: Risk for impaired skin integrity will decrease Outcome: Progressing

## 2021-03-14 NOTE — Progress Notes (Signed)
PROGRESS NOTE    [name]  KZL:[id number] DOB: 06-11-1933 DOA: 03/12/2021 PCP: [name], MD   Brief Narrative/Hospital Course:  [name], 87 y.o. female with past medical history of dementia, atrial fibrillation not on anticoagulation, chronic pain, mild renal insufficiency, chronic diastolic heart failure and history of ESBL infection presented hospital with altered mental status and confusion and not recognizing family members.  In the ED patient had stable vitals.  COVID was negative.  Influenza was negative.  CT head and x-ray of the chest was negative.  Urinalysis showed leukocyte esterase and white cells.  Patient was empirically placed on meropenem for concern of ESBL UTI   Assessment & Plan:  Acute metabolic encephalopathy in the setting of dementia Likely secondary to UTI.  On meropenem.  TSH stable.  Vitamin B12 307.  RPR was nonreactive.  Ammonia of 25.  CT head scan was negative for acute findings.  It is currently alert awake and oriented to place and person.  E. coli UTI with history of ESBL.  Continue meropenem, pending further urine culture report.  Mentation improving  Chronic pain syndrome: Resume oxycodone from home  Essential HTN,  Continue atenolol.  Chronic atrial fibrillation.-not on anticoagulation due to fall risk,  Chronic diastolic CHF:  Compensated at this time.  Continue atenolol, Crestor and aspirin.  Stage 3a chronic kidney disease- Continue to monitor closely.  DVT prophylaxis: enoxaparin (LOVENOX) injection 40 mg Start: 03/13/21 1000     Code Status: DNR  Family Communication:  Spoke with the patient at bedside.    Status is: Inpatient  Patient is inpatient due to acute metabolic encephalopathy and UTI, with IV antibiotics, pending further urine culture report.  Subjective: Today, patient was seen and examined at bedside.  Patient states that she wants to go home.  Denies any pain, nausea,  vomiting.  Objective: Vitals last 24 hrs: Vitals:   03/13/21 2039 03/13/21 2338 03/14/21 0348 03/14/21 0908  BP: 103/62 105/78 110/81 (!) 142/91  Pulse: 93 88 96 90  Resp: 19 20 18 18   Temp: 98.1 F (36.7 C) 98.2 F (36.8 C) 98.2 F (36.8 C) 98.4 F (36.9 C)  TempSrc: Oral Oral Oral Oral  SpO2: 94% 92% 94% 95%  Weight:      Height:       Weight change:   Intake/Output Summary (Last 24 hours) at 03/14/2021 1411 Last data filed at 03/14/2021 0700 Gross per 24 hour  Intake 360 ml  Output 950 ml  Net -590 ml    Net IO Since Admission: -490 mL [03/14/21 1411]   Physical Examination: General:  Average built, not in obvious distress, oriented to place and person HENT:   No scleral pallor or icterus noted. Oral mucosa is moist.  Chest:  Clear breath sounds.  Diminished breath sounds bilaterally. No crackles or wheezes.  CVS: S1 &S2 heard. No murmur.  Regular rate and rhythm. Abdomen: Soft, nontender, nondistended.  Bowel sounds are heard.   Extremities: No cyanosis, clubbing or edema.  Peripheral pulses are palpable. Psych: Alert, awake and oriented to place and person, normal mood CNS:  No cranial nerve deficits.  Power equal in all extremities.   Skin: Warm and dry.  No rashes noted.  Medications reviewed:  Scheduled Meds:  aspirin EC  81 mg Oral Daily   atenolol  12.5 mg Oral Daily   diclofenac Sodium  2 g Topical QID   enoxaparin (LOVENOX) injection  40 mg Subcutaneous Q24H   pantoprazole  40 mg Oral Daily   rosuvastatin  40 mg Oral Daily   Continuous Infusions:  meropenem (MERREM) IV 1 g (03/14/21 0910)   Consultants: None  Procedures: None  Antimicrobials: Meropenem  Anti-infectives (From admission, onward)    Start     Dose/Rate Route Frequency Ordered Stop   03/13/21 1000  meropenem (MERREM) 1 g in sodium chloride 0.9 % 100 mL IVPB        1 g 200 mL/hr over 30 Minutes Intravenous Every 12 hours 03/13/21 0108     03/12/21 2200  meropenem (MERREM) 1  g in sodium chloride 0.9 % 100 mL IVPB  Status:  Discontinued        1 g 200 mL/hr over 30 Minutes Intravenous Every 8 hours 03/12/21 2139 03/13/21 0108      Culture/Microbiology    Component Value Date/Time   SDES  03/12/2021 1828    URINE, CLEAN CATCH Performed at [facility], [address] [place], [place] [address]    [facility]  03/12/2021 1828    NONE Performed at [facility], [address], [place], [place] [address]    CULT (A) 03/12/2021 1828    >=100,000 COLONIES/mL ESCHERICHIA COLI SUSCEPTIBILITIES TO FOLLOW Performed at [facility] [address] [place], [place] [address]    REPTSTATUS PENDING 03/12/2021 1828     Data Reviewed: I personally reviewed the following labs and imaging studies.    CBC: Recent Labs  Lab 03/12/21 1839 03/13/21 0608  WBC 5.4 5.3  HGB 12.9 11.9*  HCT 41.8 37.8  MCV 89.9 89.2  PLT 174 671    Basic Metabolic Panel: Recent Labs  Lab 03/12/21 1839 03/13/21 0608  NA 138 133*  K 4.5 3.8  CL 98 98  CO2 30 28  GLUCOSE 98 117*  BUN 15 14  CREATININE 1.00 0.95  CALCIUM 9.3 8.9    GFR: Estimated Creatinine Clearance: 43.7 mL/min (by C-G formula based on SCr of 0.95 mg/dL). Liver Function Tests: Recent Labs  Lab 03/12/21 1839  AST 16  ALT 9  ALKPHOS 65  BILITOT 0.8  PROT 7.3  ALBUMIN 3.7    No results for input(s): LIPASE, AMYLASE in the last 168 hours. Recent Labs  Lab 03/13/21 0608  AMMONIA 25    Coagulation Profile: No results for input(s): INR, PROTIME in the last 168 hours. Cardiac Enzymes: No results for input(s): CKTOTAL, CKMB, CKMBINDEX, TROPONINI in the last 168 hours. BNP (last 3 results) No results for input(s): PROBNP in the last 8760 hours. HbA1C: No results for input(s): HGBA1C in the last 72 hours. CBG: Recent Labs  Lab 03/12/21 1836  GLUCAP 94    Lipid Profile: No results for input(s): CHOL, HDL, LDLCALC, TRIG, CHOLHDL, LDLDIRECT in  the last 72 hours. Thyroid Function Tests: Recent Labs    03/13/21 0608  TSH 2.657    Anemia Panel: Recent Labs    03/13/21 0608  VITAMINB12 307    Sepsis Labs: No results for input(s): PROCALCITON, LATICACIDVEN in the last 168 hours.  Recent Results (from the past 240 hour(s))  Urine Culture     Status: Abnormal (Preliminary result)   Collection Time: 03/12/21  6:28 PM   Specimen: Urine, Clean Catch  Result Value Ref Range Status   Specimen Description   Final    URINE, CLEAN CATCH Performed at [facility], [address] [place],  [address]    Special Requests   Final    NONE Performed  at [facility], [address] [place], [place] [address]    Culture (A)  Final    >=100,000 COLONIES/mL ESCHERICHIA COLI SUSCEPTIBILITIES TO FOLLOW Performed at [facility] [address] [place], [place] [address]    Report Status PENDING  Incomplete  Resp Panel by RT-PCR (Flu A&B, Covid) Nasopharyngeal Swab     Status: None   Collection Time: 03/12/21 10:17 PM   Specimen: Nasopharyngeal Swab; Nasopharyngeal(NP) swabs in vial transport medium  Result Value Ref Range Status   SARS Coronavirus 2 by RT PCR NEGATIVE NEGATIVE Final    Comment: (NOTE) SARS-CoV-2 target nucleic acids are NOT DETECTED.  The SARS-CoV-2 RNA is generally detectable in upper respiratory specimens during the acute phase of infection. The lowest concentration of SARS-CoV-2 viral copies this assay can detect is 138 copies/mL. A negative result does not preclude SARS-Cov-2 infection and should not be used as the sole basis for treatment or other patient management decisions. A negative result may occur with  improper specimen collection/handling, submission of specimen other than nasopharyngeal swab, presence of viral mutation(s) within the areas targeted by this assay, and inadequate number of viral copies(<138 copies/mL). A negative result must be  combined with clinical observations, patient history, and epidemiological information. The expected result is Negative.  Fact Sheet for Patients:  EntrepreneurPulse.com.au  Fact Sheet for Healthcare Providers:  IncredibleEmployment.be  This test is no t yet approved or cleared by the [place] FDA and  has been authorized for detection and/or diagnosis of SARS-CoV-2 by FDA under an Emergency Use Authorization (EUA). This EUA will remain  in effect (meaning this test can be used) for the duration of the COVID-19 declaration under Section 564(b)(1) of the Act, 21 U.S.C.section 360bbb-3(b)(1), unless the authorization is terminated  or revoked sooner.       Influenza A by PCR NEGATIVE NEGATIVE Final   Influenza B by PCR NEGATIVE NEGATIVE Final    Comment: (NOTE) The Xpert Xpress SARS-CoV-2/FLU/RSV plus assay is intended as an aid in the diagnosis of influenza from Nasopharyngeal swab specimens and should not be used as a sole basis for treatment. Nasal washings and aspirates are unacceptable for Xpert Xpress SARS-CoV-2/FLU/RSV testing.  Fact Sheet for Patients: EntrepreneurPulse.com.au  Fact Sheet for Healthcare Providers: IncredibleEmployment.be  This test is not yet approved or cleared by the [place] FDA and has been authorized for detection and/or diagnosis of SARS-CoV-2 by FDA under an Emergency Use Authorization (EUA). This EUA will remain in effect (meaning this test can be used) for the duration of the COVID-19 declaration under Section 564(b)(1) of the Act, 21 U.S.C. section 360bbb-3(b)(1), unless the authorization is terminated or revoked.  Performed at [facility], [address], [place], [place] [address]       Radiology Studies: DG Ribs Unilateral W/Chest Right  Result Date: 03/12/2021 CLINICAL DATA:  Fall, right lateral rib pain. EXAM: RIGHT RIBS AND  CHEST - 3+ VIEW COMPARISON:  Chest x-ray 02/13/2021. FINDINGS: Aorta is ectatic with atherosclerotic calcifications. Heart size is within normal limits. The lungs and costophrenic angles are clear. There is no pneumothorax. No acute right-sided rib fractures are seen. There is a healed right eighth rib fracture. IMPRESSION: 1. No acute right rib fracture. 2. No acute cardiopulmonary process. Electronically Signed   By: Ronney Asters M.D.   On: 03/12/2021 20:11   CT Head Wo Contrast  Result Date: 03/12/2021 CLINICAL DATA:  Mental status change, unknown cause EXAM: CT HEAD WITHOUT CONTRAST TECHNIQUE:  Contiguous axial images were obtained from the base of the skull through the vertex without intravenous contrast. COMPARISON:  CT head 02/13/2021. BRAIN: BRAIN Cerebral ventricle sizes are concordant with the degree of cerebral volume loss. Patchy and confluent areas of decreased attenuation are noted throughout the deep and periventricular white matter of the cerebral hemispheres bilaterally, compatible with chronic microvascular ischemic disease. No evidence of large-territorial acute infarction. No parenchymal hemorrhage. No mass lesion. No extra-axial collection. No mass effect or midline shift. No hydrocephalus. Basilar cisterns are patent. Empty sella. Vascular: No hyperdense vessel. Atherosclerotic calcifications are present within the cavernous internal carotid and vertebral arteries. Skull: No acute fracture or focal lesion. Sinuses/Orbits: Paranasal sinuses and mastoid air cells are clear. Bilateral lens replacement. Otherwise the orbits are unremarkable. Other: Redemonstration of nonspecific calcifications along the retromandibular fat pad. IMPRESSION: 1. No acute intracranial abnormality. 2. Empty sella. Findings is often a normal anatomic variant but can be associated with idiopathic intracranial hypertension (pseudotumor cerebri). Electronically Signed   By: Iven Finn M.D.   On: 03/12/2021 20:16      LOS: 1 day   Flora Lipps, MD [facility] 03/14/2021, 2:11 PM

## 2021-03-15 DIAGNOSIS — I5032 Chronic diastolic (congestive) heart failure: Secondary | ICD-10-CM | POA: Diagnosis not present

## 2021-03-15 DIAGNOSIS — G9340 Encephalopathy, unspecified: Secondary | ICD-10-CM | POA: Diagnosis not present

## 2021-03-15 DIAGNOSIS — N3000 Acute cystitis without hematuria: Secondary | ICD-10-CM | POA: Diagnosis not present

## 2021-03-15 DIAGNOSIS — I482 Chronic atrial fibrillation, unspecified: Secondary | ICD-10-CM | POA: Diagnosis not present

## 2021-03-15 LAB — URINE CULTURE: Culture: >=100000 — AB

## 2021-03-15 NOTE — Evaluation (Signed)
Physical Therapy Evaluation Patient Details Name: [name] MRN: [id number] DOB: 1933/10/20 Today's Date: 03/15/2021  History of Present Illness  pt is an 87 y/o female admitted 11/11 with confusion and feeling sick.    Work up for encephalopathy and ESBL E coli UTI.  PMHx:  Acute on chronic respiratory failure with hypoxia, afib, dementia, DM, HTN, OA, RA, B TKA, R THA  Clinical Impression  Pt admitted with/for AMS and UTI.  Pt is likely at baseline mentation, but still weak and needing light minimal assist for OOB.  Pt currently limited functionally due to the problems listed below.  (see problems list.)  Pt will benefit from PT to maximize function and safety to be able to get home safely with available assist from family.        Recommendations for follow up therapy are one component of a multi-disciplinary discharge planning process, led by the attending physician.  Recommendations may be updated based on patient status, additional functional criteria and insurance authorization.  Follow Up Recommendations Home health PT    Assistance Recommended at Discharge Intermittent Supervision/Assistance  Functional Status Assessment Patient has had a recent decline in their functional status and demonstrates the ability to make significant improvements in function in a reasonable and predictable amount of time.  Equipment Recommendations  None recommended by PT    Recommendations for Other Services       Precautions / Restrictions Precautions Precautions: Fall      Mobility  Bed Mobility Overal bed mobility: Needs Assistance Bed Mobility: Supine to Sit;Sit to Supine     Supine to sit: Min guard Sit to supine: Min assist   General bed mobility comments: slow and labored, but no help to get to EOB, LE assist for sit to supine    Transfers Overall transfer level: Needs assistance Equipment used: Rolling walker (2 wheels) Transfers: Sit to/from Stand Sit to Stand: Min  assist;From elevated surface           General transfer comment: cues for hand placement, assist forward and with boost    Ambulation/Gait Ambulation/Gait assistance: Min assist Gait Distance (Feet): 3 Feet (forward and back) Assistive device: Rolling walker (2 wheels) Gait Pattern/deviations: Step-through pattern   Gait velocity interpretation: <1.31 ft/sec, indicative of household ambulator   General Gait Details: short, mildly unsteady steps.  Limited by pt  Stairs            Wheelchair Mobility    Modified Rankin (Stroke Patients Only)       Balance Overall balance assessment: Mild deficits observed, not formally tested;Needs assistance Sitting-balance support: Single extremity supported;No upper extremity supported;Feet supported Sitting balance-Leahy Scale: Fair     Standing balance support: Bilateral upper extremity supported;During functional activity Standing balance-Leahy Scale: Poor Standing balance comment: reliant on UE's or external support                             Pertinent Vitals/Pain Pain Assessment: No/denies pain    Home Living Family/patient expects to be discharged to:: Private residence Living Arrangements: Children Available Help at Discharge: Family;Personal care attendant;Available 24 hours/day (3 sons at home, PCA 3 hours M-F) Type of Home: Mobile home Home Access: Ramped entrance       Home Layout: One level Home Equipment: Agricultural consultant (2 wheels);Wheelchair - manual;BSC/3in1      Prior Function Prior Level of Function : Needs assist  Mobility Comments: Always uses RW and A close by, ADLs Comments: PCA helps with ADL's     Hand Dominance   Dominant Hand: Right    Extremity/Trunk Assessment   Upper Extremity Assessment Upper Extremity Assessment: Generalized weakness    Lower Extremity Assessment Lower Extremity Assessment: Generalized weakness    Cervical / Trunk  Assessment Cervical / Trunk Assessment: Kyphotic  Communication   Communication: HOH  Cognition Arousal/Alertness: Awake/alert Behavior During Therapy: WFL for tasks assessed/performed Overall Cognitive Status: History of cognitive impairments - at baseline                                          General Comments      Exercises     Assessment/Plan    PT Assessment Patient needs continued PT services  PT Problem List Decreased strength;Decreased activity tolerance;Decreased balance;Decreased mobility;Decreased knowledge of use of DME;Decreased cognition       PT Treatment Interventions DME instruction;Gait training;Functional mobility training;Therapeutic activities;Balance training;Patient/family education    PT Goals (Current goals can be found in the Care Plan section)  Acute Rehab PT Goals Patient Stated Goal: didn't participate PT Goal Formulation: Patient unable to participate in goal setting Time For Goal Achievement: 03/29/21 Potential to Achieve Goals: Fair    Frequency Min 3X/week   Barriers to discharge        Co-evaluation               AM-PAC PT "6 Clicks" Mobility  Outcome Measure Help needed turning from your back to your side while in a flat bed without using bedrails?: A Little Help needed moving from lying on your back to sitting on the side of a flat bed without using bedrails?: A Little Help needed moving to and from a bed to a chair (including a wheelchair)?: A Little Help needed standing up from a chair using your arms (e.g., wheelchair or bedside chair)?: A Little Help needed to walk in hospital room?: A Little Help needed climbing 3-5 steps with a railing? : A Lot 6 Click Score: 17    End of Session   Activity Tolerance: Patient tolerated treatment well Patient left: in bed;with call bell/phone within reach;with bed alarm set Nurse Communication: Mobility status PT Visit Diagnosis: Other abnormalities of gait and  mobility (R26.89);Unsteadiness on feet (R26.81);Muscle weakness (generalized) (M62.81);Difficulty in walking, not elsewhere classified (R26.2)    Time: 5409-8119 PT Time Calculation (min) (ACUTE ONLY): 33 min   Charges:   PT Evaluation $PT Eval Moderate Complexity: 1 Mod PT Treatments $Gait Training: 8-22 mins        03/15/2021  Jacinto Halim., PT Acute Rehabilitation Services [phone]  (pager) [phone]  (office)  Eliseo Gum Jaley Yan 03/15/2021, 7:02 PM

## 2021-03-15 NOTE — Plan of Care (Signed)
Pt. Walked to the bathroom with walker. BMx1 Pt is alert x2.   Problem: Education: Goal: Knowledge of General Education information will improve Description: Including pain rating scale, medication(s)/side effects and non-pharmacologic comfort measures Outcome: Progressing   Problem: Health Behavior/Discharge Planning: Goal: Ability to manage health-related needs will improve Outcome: Progressing   Problem: Clinical Measurements: Goal: Ability to maintain clinical measurements within normal limits will improve Outcome: Progressing Goal: Will remain free from infection Outcome: Progressing Goal: Diagnostic test results will improve Outcome: Progressing Goal: Respiratory complications will improve Outcome: Progressing Goal: Cardiovascular complication will be avoided Outcome: Progressing   Problem: Activity: Goal: Risk for activity intolerance will decrease Outcome: Progressing   Problem: Nutrition: Goal: Adequate nutrition will be maintained Outcome: Progressing   Problem: Coping: Goal: Level of anxiety will decrease Outcome: Progressing   Problem: Elimination: Goal: Will not experience complications related to bowel motility Outcome: Progressing Goal: Will not experience complications related to urinary retention Outcome: Progressing   Problem: Pain Managment: Goal: General experience of comfort will improve Outcome: Progressing   Problem: Safety: Goal: Ability to remain free from injury will improve Outcome: Progressing   Problem: Skin Integrity: Goal: Risk for impaired skin integrity will decrease Outcome: Progressing

## 2021-03-15 NOTE — Progress Notes (Signed)
PROGRESS NOTE    [name]  LPF:[id number] DOB: [birth year] DOA: 03/12/2021 PCP: [name], MD   Brief Narrative/Hospital Course:  [name], 87 y.o. female with past medical history of dementia, atrial fibrillation not on anticoagulation, chronic pain, mild renal insufficiency, chronic diastolic heart failure and history of ESBL infection presented hospital with altered mental status and confusion and not recognizing family members.  In the ED, patient had stable vitals.  COVID was negative.  Influenza was negative.  CT head and x-ray of the chest was negative.  Urinalysis showed leukocyte esterase and white cells.  Patient was empirically placed on meropenem for concern of ESBL UTI   Assessment & Plan:  Acute metabolic encephalopathy in the setting of dementia, likely secondary to UTI.  On IV meropenem.  TSH stable.  Vitamin B12 at 307.  RPR was nonreactive.  Ammonia of 25.  CT head scan was negative for acute findings.  Patient is currently at her baseline mentation.  ESBL E. coli UTI with history of ESBL.  Continue meropenem, urine culture shows resistant to multiple antibiotic including penicillin and cephalosporin Zosyn and Bactrim.   sensitive to meropenem at this time  Chronic pain syndrome: Resumed oxycodone from home, pain controlled  Essential HTN,  Continue atenolol.  Chronic atrial fibrillation.-not on anticoagulation due to fall risk, overall controlled.  Chronic diastolic CHF:  Compensated at this time.  Continue atenolol, Crestor and aspirin.  Stage 3a chronic kidney disease- Continue to monitor closely.  Debility, weakness.  We will get PT evaluation.  Might need rehabilitation  DVT prophylaxis: enoxaparin (LOVENOX) injection 40 mg Start: 03/13/21 1000     Code Status: DNR  Family Communication:  Spoke with the patient's son at bedside and updated him about the clinical condition of the patient..  Patient's family stated that she was at rehab  in the past.  Status is: Inpatient  Patient is inpatient due to acute metabolic encephalopathy and UTI, with IV antibiotics, pending physical therapy evaluation.  Subjective: Today, patient was seen and examined at bedside.  Patient denies overt pain today.  Denies any dysuria, urgency, frequency.  Denies shortness of breath cough or fever.    Objective: Vitals last 24 hrs: Vitals:   03/14/21 2037 03/15/21 0019 03/15/21 0512 03/15/21 0826  BP: (!) 136/91 (!) 133/99 (!) 141/93 140/81  Pulse: 97 (!) 101 (!) 101 (!) 106  Resp: (!) 21 20 17 18   Temp: (!) 97.5 F (36.4 C) 97.8 F (36.6 C) 98.2 F (36.8 C) 98 F (36.7 C)  TempSrc: Oral Oral Oral   SpO2: 95% 95% 94% 94%  Weight:      Height:       Weight change:   Intake/Output Summary (Last 24 hours) at 03/15/2021 1320 Last data filed at 03/15/2021 0400 Gross per 24 hour  Intake 250.34 ml  Output --  Net 250.34 ml    Net IO Since Admission: -239.66 mL [03/15/21 1320]   Body mass index is 27.29 kg/m.   Physical Examination: General:  Average built, not in obvious distress, oriented to place and person. HENT:   No scleral pallor or icterus noted. Oral mucosa is moist.  Chest:   Diminished breath sounds bilaterally. No crackles or wheezes.  CVS: S1 &S2 heard. No murmur.  Regular rate and rhythm. Abdomen: Soft, nontender, nondistended.  Bowel sounds are heard.   Extremities: No cyanosis, clubbing or edema.  Peripheral pulses are palpable. Psych: Alert, awake and oriented to place and person,  normal mood CNS:  No cranial nerve deficits.  Power equal in all extremities.   Skin: Warm and dry.  No rashes noted.  Medications reviewed:  Scheduled Meds:  aspirin EC  81 mg Oral Daily   atenolol  12.5 mg Oral Daily   diclofenac Sodium  2 g Topical QID   enoxaparin (LOVENOX) injection  40 mg Subcutaneous Q24H   pantoprazole  40 mg Oral Daily   rosuvastatin  40 mg Oral Daily   Continuous Infusions:  sodium chloride Stopped  (03/15/21 0351)   meropenem (MERREM) IV 1 g (03/15/21 1106)   Consultants: None  Procedures: None  Antimicrobials: Meropenem  Anti-infectives (From admission, onward)    Start     Dose/Rate Route Frequency Ordered Stop   03/13/21 1000  meropenem (MERREM) 1 g in sodium chloride 0.9 % 100 mL IVPB        1 g 200 mL/hr over 30 Minutes Intravenous Every 12 hours 03/13/21 0108     03/12/21 2200  meropenem (MERREM) 1 g in sodium chloride 0.9 % 100 mL IVPB  Status:  Discontinued        1 g 200 mL/hr over 30 Minutes Intravenous Every 8 hours 03/12/21 2139 03/13/21 0108      Culture/Microbiology    Component Value Date/Time   SDES URINE, CLEAN CATCH 03/13/2021 0051   SPECREQUEST NONE 03/13/2021 0051   CULT  03/13/2021 0051    NO GROWTH Performed at [facility], [address] [place], [place] [address]    REPTSTATUS 03/14/2021 FINAL 03/13/2021 0051     Data Reviewed: I personally reviewed the following labs and imaging studies.    CBC: Recent Labs  Lab 03/12/21 1839 03/13/21 0608  WBC 5.4 5.3  HGB 12.9 11.9*  HCT 41.8 37.8  MCV 89.9 89.2  PLT 174 637    Basic Metabolic Panel: Recent Labs  Lab 03/12/21 1839 03/13/21 0608  NA 138 133*  K 4.5 3.8  CL 98 98  CO2 30 28  GLUCOSE 98 117*  BUN 15 14  CREATININE 1.00 0.95  CALCIUM 9.3 8.9    GFR: Estimated Creatinine Clearance: 43.7 mL/min (by C-G formula based on SCr of 0.95 mg/dL). Liver Function Tests: Recent Labs  Lab 03/12/21 1839  AST 16  ALT 9  ALKPHOS 65  BILITOT 0.8  PROT 7.3  ALBUMIN 3.7    No results for input(s): LIPASE, AMYLASE in the last 168 hours. Recent Labs  Lab 03/13/21 0608  AMMONIA 25    Coagulation Profile: No results for input(s): INR, PROTIME in the last 168 hours. Cardiac Enzymes: No results for input(s): CKTOTAL, CKMB, CKMBINDEX, TROPONINI in the last 168 hours. BNP (last 3 results) No results for input(s): PROBNP in the last 8760 hours. HbA1C: No results for  input(s): HGBA1C in the last 72 hours. CBG: Recent Labs  Lab 03/12/21 1836  GLUCAP 94    Lipid Profile: No results for input(s): CHOL, HDL, LDLCALC, TRIG, CHOLHDL, LDLDIRECT in the last 72 hours. Thyroid Function Tests: Recent Labs    03/13/21 0608  TSH 2.657    Anemia Panel: Recent Labs    03/13/21 0608  VITAMINB12 307    Sepsis Labs: No results for input(s): PROCALCITON, LATICACIDVEN in the last 168 hours.  Recent Results (from the past 240 hour(s))  Urine Culture     Status: Abnormal   Collection Time: 03/12/21  6:28 PM   Specimen: Urine, Clean Catch  Result Value Ref Range Status  Specimen Description   Final    URINE, CLEAN CATCH Performed at [facility], [address], [place], [place] [address]    Special Requests   Final    NONE Performed at [facility], [address], [place], [place] [address]    Culture (A)  Final    >=100,000 COLONIES/mL ESCHERICHIA COLI Confirmed Extended Spectrum Beta-Lactamase Producer (ESBL).  In bloodstream infections from ESBL organisms, carbapenems are preferred over piperacillin/tazobactam. They are shown to have a lower risk of mortality.    Report Status 03/15/2021 FINAL  Final   Organism ID, Bacteria ESCHERICHIA COLI (A)  Final      Susceptibility   Escherichia coli - MIC*    AMPICILLIN >=32 RESISTANT Resistant     CEFAZOLIN >=64 RESISTANT Resistant     CEFEPIME 16 RESISTANT Resistant     CEFTRIAXONE >=64 RESISTANT Resistant     CIPROFLOXACIN >=4 RESISTANT Resistant     GENTAMICIN <=1 SENSITIVE Sensitive     IMIPENEM <=0.25 SENSITIVE Sensitive     NITROFURANTOIN <=16 SENSITIVE Sensitive     TRIMETH/SULFA >=320 RESISTANT Resistant     AMPICILLIN/SULBACTAM >=32 RESISTANT Resistant     PIP/TAZO 64 INTERMEDIATE Intermediate     * >=100,000 COLONIES/mL ESCHERICHIA COLI  Resp Panel by RT-PCR (Flu A&B, Covid) Nasopharyngeal Swab     Status: None   Collection Time: 03/12/21 10:17  PM   Specimen: Nasopharyngeal Swab; Nasopharyngeal(NP) swabs in vial transport medium  Result Value Ref Range Status   SARS Coronavirus 2 by RT PCR NEGATIVE NEGATIVE Final    Comment: (NOTE) SARS-CoV-2 target nucleic acids are NOT DETECTED.  The SARS-CoV-2 RNA is generally detectable in upper respiratory specimens during the acute phase of infection. The lowest concentration of SARS-CoV-2 viral copies this assay can detect is 138 copies/mL. A negative result does not preclude SARS-Cov-2 infection and should not be used as the sole basis for treatment or other patient management decisions. A negative result may occur with  improper specimen collection/handling, submission of specimen other than nasopharyngeal swab, presence of viral mutation(s) within the areas targeted by this assay, and inadequate number of viral copies(<138 copies/mL). A negative result must be combined with clinical observations, patient history, and epidemiological information. The expected result is Negative.  Fact Sheet for Patients:  EntrepreneurPulse.com.au  Fact Sheet for Healthcare Providers:  IncredibleEmployment.be  This test is no t yet approved or cleared by the [place] FDA and  has been authorized for detection and/or diagnosis of SARS-CoV-2 by FDA under an Emergency Use Authorization (EUA). This EUA will remain  in effect (meaning this test can be used) for the duration of the COVID-19 declaration under Section 564(b)(1) of the Act, 21 U.S.C.section 360bbb-3(b)(1), unless the authorization is terminated  or revoked sooner.       Influenza A by PCR NEGATIVE NEGATIVE Final   Influenza B by PCR NEGATIVE NEGATIVE Final    Comment: (NOTE) The Xpert Xpress SARS-CoV-2/FLU/RSV plus assay is intended as an aid in the diagnosis of influenza from Nasopharyngeal swab specimens and should not be used as a sole basis for treatment. Nasal washings and aspirates are  unacceptable for Xpert Xpress SARS-CoV-2/FLU/RSV testing.  Fact Sheet for Patients: EntrepreneurPulse.com.au  Fact Sheet for Healthcare Providers: IncredibleEmployment.be  This test is not yet approved or cleared by the [place] FDA and has been authorized for detection and/or diagnosis of SARS-CoV-2 by FDA under an Emergency Use Authorization (EUA). This EUA will remain in effect (meaning this test  can be used) for the duration of the COVID-19 declaration under Section 564(b)(1) of the Act, 21 U.S.C. section 360bbb-3(b)(1), unless the authorization is terminated or revoked.  Performed at [facility], [address], [place], [place] [address]   Urine Culture     Status: None   Collection Time: 03/13/21 12:51 AM   Specimen: Urine, Clean Catch  Result Value Ref Range Status   Specimen Description URINE, CLEAN CATCH  Final   Special Requests NONE  Final   Culture   Final    NO GROWTH Performed at [facility], [place] [address], [place], [place] [address]    Report Status 03/14/2021 FINAL  Final      Radiology Studies: No results found.   LOS: 2 days   Flora Lipps, MD [facility] 03/15/2021, 1:20 PM

## 2021-03-16 DIAGNOSIS — G9340 Encephalopathy, unspecified: Secondary | ICD-10-CM | POA: Diagnosis not present

## 2021-03-16 DIAGNOSIS — I482 Chronic atrial fibrillation, unspecified: Secondary | ICD-10-CM | POA: Diagnosis not present

## 2021-03-16 DIAGNOSIS — N3000 Acute cystitis without hematuria: Secondary | ICD-10-CM | POA: Diagnosis not present

## 2021-03-16 DIAGNOSIS — I5032 Chronic diastolic (congestive) heart failure: Secondary | ICD-10-CM | POA: Diagnosis not present

## 2021-03-16 LAB — BASIC METABOLIC PANEL
Anion gap: 11 (ref 5–15)
BUN: 18 mg/dL (ref 8–23)
CO2: 24 mmol/L (ref 22–32)
Calcium: 9.2 mg/dL (ref 8.9–10.3)
Chloride: 102 mmol/L (ref 98–111)
Creatinine, Ser: 0.93 mg/dL (ref 0.44–1.00)
GFR, Estimated: >60 mL/min (ref >60)
Glucose, Bld: 107 mg/dL — ABNORMAL HIGH (ref 70–99)
Potassium: 4.1 mmol/L (ref 3.5–5.1)
Sodium: 137 mmol/L (ref 135–145)

## 2021-03-16 LAB — CBC
HCT: 42 % (ref 36.0–46.0)
Hemoglobin: 13.4 g/dL (ref 12.0–15.0)
MCH: 28.2 pg (ref 26.0–34.0)
MCHC: 31.9 g/dL (ref 30.0–36.0)
MCV: 88.4 fL (ref 80.0–100.0)
Platelets: 183 10*3/uL (ref 150–400)
RBC: 4.75 MIL/uL (ref 3.87–5.11)
RDW: 14.1 % (ref 11.5–15.5)
WBC: 6.7 10*3/uL (ref 4.0–10.5)
nRBC: 0 % (ref 0.0–0.2)

## 2021-03-16 LAB — MAGNESIUM: Magnesium: 1.9 mg/dL (ref 1.7–2.4)

## 2021-03-16 NOTE — Progress Notes (Signed)
NURSING PROGRESS NOTE  [name] [id number] Discharge Data: 03/16/2021 2:10 PM Attending Provider: Flora Lipps, MD [id number], [name], MD     [name] to be D/C'd Home per MD order.  Discussed with the patient the After Visit Summary and all questions fully answered. All IV's discontinued with no bleeding noted. All belongings returned to patient for patient to take home.   Last Vital Signs:  Blood pressure 126/73, pulse 99, temperature 98 F (36.7 C), resp. rate 18, height 5\' 5"  (1.651 m), weight 74.4 kg, SpO2 96 %.  Discharge Medication List Allergies as of 03/16/2021       Reactions   Sulfonamide Derivatives Hives, Rash        Medication List     STOP taking these medications    furosemide 40 MG tablet Commonly known as: LASIX       TAKE these medications    acetaminophen 325 MG tablet Commonly known as: TYLENOL Take 2 tablets (650 mg total) by mouth every 6 (six) hours.   albuterol 108 (90 Base) MCG/ACT inhaler Commonly known as: VENTOLIN HFA Inhale 2 puffs into the lungs every 4 (four) hours as needed for wheezing or shortness of breath.   ALPRAZolam 0.25 MG tablet Commonly known as: XANAX Take 1 tablet (0.25 mg total) by mouth 2 (two) times daily as needed for anxiety.   ammonium lactate 12 % cream Commonly known as: AMLACTIN Apply topically as needed for dry skin. What changed: how much to take   aspirin EC 81 MG tablet Take 1 po BID x 6 weeks then resume 1 po daily What changed:  how much to take how to take this when to take this additional instructions   atenolol 25 MG tablet Commonly known as: TENORMIN Take 0.5 tablets (12.5 mg total) by mouth daily.   Calcium Carbonate-Vitamin D3 600-400 MG-UNIT Tabs Take 1 tablet by mouth daily.   diclofenac sodium 1 % Gel Commonly known as: VOLTAREN Apply 2 g topically 4 (four) times daily. Rub into affected area of foot 2 to 4 times daily   DULoxetine 60 MG capsule Commonly  known as: CYMBALTA TAKE 1 CAPSULE BY MOUTH DAILY ALONG WITH 30 MG FOR TOTAL DAILY DOSE OF 90 MG What changed: See the new instructions.   DULoxetine 30 MG capsule Commonly known as: CYMBALTA TAKE 1 CAPSULE BY MOUTH DAILY ALONG WITH 60 MG FOR TOTAL DAILY DOSE OF 90 MG What changed: See the new instructions.   gabapentin 800 MG tablet Commonly known as: NEURONTIN TAKE 1 TABLET BY MOUTH 3 TIMES DAILY   ketoconazole 2 % cream Commonly known as: NIZORAL Apply 1 application topically daily.   mupirocin ointment 2 % Commonly known as: BACTROBAN Apply 1 application topically 2 (two) times daily.   oxyCODONE 5 MG immediate release tablet Commonly known as: Oxy IR/ROXICODONE Take 5 mg by mouth every 6 (six) hours as needed for moderate pain.   pantoprazole 40 MG tablet Commonly known as: PROTONIX TAKE 1 TABLET BY MOUTH ONCE DAILY   polyethylene glycol 17 g packet Commonly known as: MIRALAX / GLYCOLAX Take 17 g by mouth daily as needed for mild constipation or moderate constipation.   rosuvastatin 40 MG tablet Commonly known as: CRESTOR TAKE 1 TABLET BY MOUTH EACH EVENING What changed: See the new instructions.

## 2021-03-16 NOTE — Discharge Summary (Signed)
Physician Discharge Summary  [name] ION:[id number] DOB: June 28, 1933 DOA: 03/12/2021  PCP: Donita Brooks, MD  Admit date: 03/12/2021 Discharge date: 03/16/2021  Admitted From: Home  Discharge disposition: Home with home health  Recommendations for Outpatient Follow-Up:   Follow up with your primary care provider in one week.  Check CBC, BMP, magnesium in the next visit  Discharge Diagnosis:   Principal Problem:   Acute encephalopathy Active Problems:   Essential hypertension   Dementia (HCC)   Chronic pain syndrome   ESBL (extended spectrum beta-lactamase) producing bacteria infection   Atrial fibrillation, chronic (HCC)   Chronic diastolic CHF (congestive heart failure) (HCC)   Stage 3a chronic kidney disease (CKD) (HCC)   Acute cystitis without hematuria  Discharge Condition: Improved.  Diet recommendation: Low sodium, heart healthy.    Wound care: None.  Code status: DNR   History of Present Illness:   [name], 87 y.o. female with past medical history of dementia, atrial fibrillation not on anticoagulation, chronic pain, mild renal insufficiency, chronic diastolic heart failure and history of ESBL infection presented hospital with altered mental status and confusion and not recognizing family members.  In the ED, patient had stable vitals.  COVID was negative.  Influenza was negative.  CT head and x-ray of the chest was negative.  Urinalysis showed leukocyte esterase and white cells.  Patient was empirically placed on meropenem for concern of ESBL UTI.   Hospital Course:   Following conditions were addressed during hospitalization as listed below,  Acute metabolic encephalopathy likely multifactorial in the setting of dementia, polypharmacy, and possible UTI.  Patient was initiated on IV meropenem in the hospital since that she had a history of ESBL UTI in the past.  TSH was within normal limits.  Vitamin B12 at 307.  RPR was nonreactive.   Ammonia of 25.  CT head scan was negative for acute findings.  At this time patient is at her baseline mentation.   ESBL E. coli UTI with history of ESBL.  Received IV meropenem, urine culture shows resistant to multiple antibiotic including penicillin and cephalosporin, Zosyn and Bactrim.  Spoke with infectious disease who recommended no further antibiotics since the patient is afebrile without any leukocytosis and urinary symptoms.   Chronic pain syndrome: Resumed oxycodone from home, pain controlled at this time but have spoken with the family members about minimizing narcotics.   Essential HTN,  Continue atenolol from home..   Chronic atrial fibrillation.-not on anticoagulation due to fall risk, overall controlled.  Continue atenolol from home   Chronic diastolic CHF:  Compensated at this time.  Continue atenolol, Crestor and aspirin.   Stage 3a chronic kidney disease- Continue to monitor closely.   Debility, weakness.  Physical therapy has seen the patient and recommended home health PT on discharge.  Disposition.  At this time, patient is stable for disposition home with outpatient PCP follow-up.  Spoke with the patient's daughter at bedside.  Medical Consultants:   Verbal consult with infectious disease Dr Thedore Mins  Procedures:    None Subjective:   Today, patient seen and examined at bedside patient denies overt pain, nausea, vomiting, fever or chills.  Patient's daughter at bedside.  Discharge Exam:   Vitals:   03/16/21 0420 03/16/21 0812  BP: (!) 146/96 126/73  Pulse: 95 99  Resp: 16 18  Temp: 98.4 F (36.9 C) 98 F (36.7 C)  SpO2: 95% 96%   Vitals:   03/15/21 2020 03/16/21 0028 03/16/21 0420 03/16/21  0812  BP: (!) 131/98 (!) 149/90 (!) 146/96 126/73  Pulse: 96 99 95 99  Resp: 16 20 16 18   Temp: (!) 97.4 F (36.3 C) 98.2 F (36.8 C) 98.4 F (36.9 C) 98 F (36.7 C)  TempSrc: Oral Oral    SpO2: 93% 95% 95% 96%  Weight:      Height:       Body mass  index is 27.29 kg/m.   General: Alert awake, not in obvious distress HENT: pupils equally reacting to light,  No scleral pallor or icterus noted. Oral mucosa is moist.  Chest:  Clear breath sounds.  Diminished breath sounds bilaterally. No crackles or wheezes.  CVS: S1 &S2 heard. No murmur.  Regular rate and rhythm. Abdomen: Soft, nontender, nondistended.  Bowel sounds are heard.   Extremities: No cyanosis, clubbing or edema.  Peripheral pulses are palpable. Psych: Alert, awake and oriented to place and person, normal mood CNS:  No cranial nerve deficits.  Power equal in all extremities.   Skin: Warm and dry.  No rashes noted.  The results of significant diagnostics from this hospitalization (including imaging, microbiology, ancillary and laboratory) are listed below for reference.     Diagnostic Studies:   DG Ribs Unilateral W/Chest Right  Result Date: 03/12/2021 CLINICAL DATA:  Fall, right lateral rib pain. EXAM: RIGHT RIBS AND CHEST - 3+ VIEW COMPARISON:  Chest x-ray 02/13/2021. FINDINGS: Aorta is ectatic with atherosclerotic calcifications. Heart size is within normal limits. The lungs and costophrenic angles are clear. There is no pneumothorax. No acute right-sided rib fractures are seen. There is a healed right eighth rib fracture. IMPRESSION: 1. No acute right rib fracture. 2. No acute cardiopulmonary process. Electronically Signed   By: Darliss Cheney M.D.   On: 03/12/2021 20:11   CT Head Wo Contrast  Result Date: 03/12/2021 CLINICAL DATA:  Mental status change, unknown cause EXAM: CT HEAD WITHOUT CONTRAST TECHNIQUE: Contiguous axial images were obtained from the base of the skull through the vertex without intravenous contrast. COMPARISON:  CT head 02/13/2021. BRAIN: BRAIN Cerebral ventricle sizes are concordant with the degree of cerebral volume loss. Patchy and confluent areas of decreased attenuation are noted throughout the deep and periventricular white matter of the cerebral  hemispheres bilaterally, compatible with chronic microvascular ischemic disease. No evidence of large-territorial acute infarction. No parenchymal hemorrhage. No mass lesion. No extra-axial collection. No mass effect or midline shift. No hydrocephalus. Basilar cisterns are patent. Empty sella. Vascular: No hyperdense vessel. Atherosclerotic calcifications are present within the cavernous internal carotid and vertebral arteries. Skull: No acute fracture or focal lesion. Sinuses/Orbits: Paranasal sinuses and mastoid air cells are clear. Bilateral lens replacement. Otherwise the orbits are unremarkable. Other: Redemonstration of nonspecific calcifications along the retromandibular fat pad. IMPRESSION: 1. No acute intracranial abnormality. 2. Empty sella. Findings is often a normal anatomic variant but can be associated with idiopathic intracranial hypertension (pseudotumor cerebri). Electronically Signed   By: Tish Frederickson M.D.   On: 03/12/2021 20:16     Labs:   Basic Metabolic Panel: Recent Labs  Lab 03/12/21 1839 03/13/21 0608 03/16/21 0228  NA 138 133* 137  K 4.5 3.8 4.1  CL 98 98 102  CO2 30 28 24   GLUCOSE 98 117* 107*  BUN 15 14 18   CREATININE 1.00 0.95 0.93  CALCIUM 9.3 8.9 9.2  MG  --   --  1.9   GFR Estimated Creatinine Clearance: 44.7 mL/min (by C-G formula based on SCr of 0.93 mg/dL). Liver Function  Tests: Recent Labs  Lab 03/12/21 1839  AST 16  ALT 9  ALKPHOS 65  BILITOT 0.8  PROT 7.3  ALBUMIN 3.7   No results for input(s): LIPASE, AMYLASE in the last 168 hours. Recent Labs  Lab 03/13/21 0608  AMMONIA 25   Coagulation profile No results for input(s): INR, PROTIME in the last 168 hours.  CBC: Recent Labs  Lab 03/12/21 1839 03/13/21 0608 03/16/21 0228  WBC 5.4 5.3 6.7  HGB 12.9 11.9* 13.4  HCT 41.8 37.8 42.0  MCV 89.9 89.2 88.4  PLT 174 163 183   Cardiac Enzymes: No results for input(s): CKTOTAL, CKMB, CKMBINDEX, TROPONINI in the last 168  hours. BNP: Invalid input(s): POCBNP CBG: Recent Labs  Lab 03/12/21 1836  GLUCAP 94   D-Dimer No results for input(s): DDIMER in the last 72 hours. Hgb A1c No results for input(s): HGBA1C in the last 72 hours. Lipid Profile No results for input(s): CHOL, HDL, LDLCALC, TRIG, CHOLHDL, LDLDIRECT in the last 72 hours. Thyroid function studies No results for input(s): TSH, T4TOTAL, T3FREE, THYROIDAB in the last 72 hours.  Invalid input(s): FREET3 Anemia work up No results for input(s): VITAMINB12, FOLATE, FERRITIN, TIBC, IRON, RETICCTPCT in the last 72 hours. Microbiology Recent Results (from the past 240 hour(s))  Urine Culture     Status: Abnormal   Collection Time: 03/12/21  6:28 PM   Specimen: Urine, Clean Catch  Result Value Ref Range Status   Specimen Description   Final    URINE, CLEAN CATCH Performed at [facility], [address], [place], [place] [address]    Special Requests   Final    NONE Performed at [facility], [address] [place], [place] [address]    Culture (A)  Final    >=100,000 COLONIES/mL ESCHERICHIA COLI Confirmed Extended Spectrum Beta-Lactamase Producer (ESBL).  In bloodstream infections from ESBL organisms, carbapenems are preferred over piperacillin/tazobactam. They are shown to have a lower risk of mortality.    Report Status 03/15/2021 FINAL  Final   Organism ID, Bacteria ESCHERICHIA COLI (A)  Final      Susceptibility   Escherichia coli - MIC*    AMPICILLIN >=32 RESISTANT Resistant     CEFAZOLIN >=64 RESISTANT Resistant     CEFEPIME 16 RESISTANT Resistant     CEFTRIAXONE >=64 RESISTANT Resistant     CIPROFLOXACIN >=4 RESISTANT Resistant     GENTAMICIN <=1 SENSITIVE Sensitive     IMIPENEM <=0.25 SENSITIVE Sensitive     NITROFURANTOIN <=16 SENSITIVE Sensitive     TRIMETH/SULFA >=320 RESISTANT Resistant     AMPICILLIN/SULBACTAM >=32 RESISTANT Resistant     PIP/TAZO 64 INTERMEDIATE Intermediate      * >=100,000 COLONIES/mL ESCHERICHIA COLI  Resp Panel by RT-PCR (Flu A&B, Covid) Nasopharyngeal Swab     Status: None   Collection Time: 03/12/21 10:17 PM   Specimen: Nasopharyngeal Swab; Nasopharyngeal(NP) swabs in vial transport medium  Result Value Ref Range Status   SARS Coronavirus 2 by RT PCR NEGATIVE NEGATIVE Final    Comment: (NOTE) SARS-CoV-2 target nucleic acids are NOT DETECTED.  The SARS-CoV-2 RNA is generally detectable in upper respiratory specimens during the acute phase of infection. The lowest concentration of SARS-CoV-2 viral copies this assay can detect is 138 copies/mL. A negative result does not preclude SARS-Cov-2 infection and should not be used as the sole basis for treatment or other patient management decisions. A negative result may occur with  improper specimen collection/handling, submission of specimen other than  nasopharyngeal swab, presence of viral mutation(s) within the areas targeted by this assay, and inadequate number of viral copies(<138 copies/mL). A negative result must be combined with clinical observations, patient history, and epidemiological information. The expected result is Negative.  Fact Sheet for Patients:  BloggerCourse.com  Fact Sheet for Healthcare Providers:  SeriousBroker.it  This test is no t yet approved or cleared by the [place] FDA and  has been authorized for detection and/or diagnosis of SARS-CoV-2 by FDA under an Emergency Use Authorization (EUA). This EUA will remain  in effect (meaning this test can be used) for the duration of the COVID-19 declaration under Section 564(b)(1) of the Act, 21 U.S.C.section 360bbb-3(b)(1), unless the authorization is terminated  or revoked sooner.       Influenza A by PCR NEGATIVE NEGATIVE Final   Influenza B by PCR NEGATIVE NEGATIVE Final    Comment: (NOTE) The Xpert Xpress SARS-CoV-2/FLU/RSV plus assay is intended as an  aid in the diagnosis of influenza from Nasopharyngeal swab specimens and should not be used as a sole basis for treatment. Nasal washings and aspirates are unacceptable for Xpert Xpress SARS-CoV-2/FLU/RSV testing.  Fact Sheet for Patients: BloggerCourse.com  Fact Sheet for Healthcare Providers: SeriousBroker.it  This test is not yet approved or cleared by the [place] FDA and has been authorized for detection and/or diagnosis of SARS-CoV-2 by FDA under an Emergency Use Authorization (EUA). This EUA will remain in effect (meaning this test can be used) for the duration of the COVID-19 declaration under Section 564(b)(1) of the Act, 21 U.S.C. section 360bbb-3(b)(1), unless the authorization is terminated or revoked.  Performed at [facility], [address] [place], [place] [address]   Urine Culture     Status: None   Collection Time: 03/13/21 12:51 AM   Specimen: Urine, Clean Catch  Result Value Ref Range Status   Specimen Description URINE, CLEAN CATCH  Final   Special Requests NONE  Final   Culture   Final    NO GROWTH Performed at [facility], [address], [place], [place] [address]    Report Status 03/14/2021 FINAL  Final     Discharge Instructions:   Discharge Instructions     Call MD for:  persistant nausea and vomiting   Complete by: As directed    Call MD for:  temperature >100.4   Complete by: As directed    Diet - low sodium heart healthy   Complete by: As directed    Discharge instructions   Complete by: As directed    Follow-up with your primary care physician in 1 week.  Seek medical attention for worsening symptoms.  Continue physical therapy at home. Minimize oxycodone for pain if possible. Discuss with your primary care provider regarding medications and possibility of lowering doses.   Increase activity slowly   Complete by: As directed       Allergies as of  03/16/2021       Reactions   Sulfonamide Derivatives Hives, Rash        Medication List     STOP taking these medications    furosemide 40 MG tablet Commonly known as: LASIX       TAKE these medications    acetaminophen 325 MG tablet Commonly known as: TYLENOL Take 2 tablets (650 mg total) by mouth every 6 (six) hours.   albuterol 108 (90 Base) MCG/ACT inhaler Commonly known as: VENTOLIN HFA Inhale 2 puffs into the lungs every 4 (four) hours as  needed for wheezing or shortness of breath.   ALPRAZolam 0.25 MG tablet Commonly known as: XANAX Take 1 tablet (0.25 mg total) by mouth 2 (two) times daily as needed for anxiety.   ammonium lactate 12 % cream Commonly known as: AMLACTIN Apply topically as needed for dry skin. What changed: how much to take   aspirin EC 81 MG tablet Take 1 po BID x 6 weeks then resume 1 po daily What changed:  how much to take how to take this when to take this additional instructions   atenolol 25 MG tablet Commonly known as: TENORMIN Take 0.5 tablets (12.5 mg total) by mouth daily.   Calcium Carbonate-Vitamin D3 600-400 MG-UNIT Tabs Take 1 tablet by mouth daily.   diclofenac sodium 1 % Gel Commonly known as: VOLTAREN Apply 2 g topically 4 (four) times daily. Rub into affected area of foot 2 to 4 times daily   DULoxetine 60 MG capsule Commonly known as: CYMBALTA TAKE 1 CAPSULE BY MOUTH DAILY ALONG WITH 30 MG FOR TOTAL DAILY DOSE OF 90 MG What changed: See the new instructions.   DULoxetine 30 MG capsule Commonly known as: CYMBALTA TAKE 1 CAPSULE BY MOUTH DAILY ALONG WITH 60 MG FOR TOTAL DAILY DOSE OF 90 MG What changed: See the new instructions.   gabapentin 800 MG tablet Commonly known as: NEURONTIN TAKE 1 TABLET BY MOUTH 3 TIMES DAILY   ketoconazole 2 % cream Commonly known as: NIZORAL Apply 1 application topically daily.   mupirocin ointment 2 % Commonly known as: BACTROBAN Apply 1 application topically 2 (two)  times daily.   oxyCODONE 5 MG immediate release tablet Commonly known as: Oxy IR/ROXICODONE Take 5 mg by mouth every 6 (six) hours as needed for moderate pain.   pantoprazole 40 MG tablet Commonly known as: PROTONIX TAKE 1 TABLET BY MOUTH ONCE DAILY   polyethylene glycol 17 g packet Commonly known as: MIRALAX / GLYCOLAX Take 17 g by mouth daily as needed for mild constipation or moderate constipation.   rosuvastatin 40 MG tablet Commonly known as: CRESTOR TAKE 1 TABLET BY MOUTH EACH EVENING What changed: See the new instructions.        Follow-up Information     Donita Brooks, MD. Schedule an appointment as soon as possible for a visit in 1 week(s).   Specialty: Family Medicine Contact information: [address] [place] [address] [phone]         Lewayne Bunting, MD .   Specialty: Cardiology Contact information: [address] [place] [address] [phone]                  Time coordinating discharge: 39 minutes  Signed:  Mosi Hannold  [facility] 03/16/2021, 9:27 AM

## 2021-03-16 NOTE — TOC Transition Note (Signed)
Transition of Care [facility]) - CM/SW Discharge Note   Patient Details  Name: [name] MRN: [id number] Date of Birth: Dec 03, 1933  Transition of [facility]) CM/SW Contact:  Pollie Friar, RN Phone Number: 03/16/2021, 10:16 AM   Clinical Narrative:    Patient is discharging home with home health services through [place]. Pt was recently active with [facility] and would like to use them again. Information on the AVS. Pt has all needed DME at home: wheelchair/ walker/ shower seat Pt has needed transportation home today and transport to needed appointments. Daughter states the patients sons manage the patients medications and there are no issues at home.    Final next level of care: Home w Home Health Services Barriers to Discharge: No Barriers Identified   Patient Goals and CMS Choice   CMS Medicare.gov Compare Post Acute Care list provided to:: Patient Represenative (must comment) Choice offered to / list presented to : Adult Children  Discharge Placement                       Discharge Plan and Services                          HH Arranged: PT HH Agency: [place] Date [facility] Agency Contacted: 03/16/21   Representative spoke with at [place]: [place] ([place]) Interventions     Readmission Risk Interventions Readmission Risk Prevention Plan 02/16/2021  Transportation Screening Complete  HRI or [place] Complete  Social Work Consult for [place] Planning/Counseling Complete  Palliative Care Screening Not Applicable  Medication Review Press photographer) Complete  Some recent data might be hidden

## 2021-03-17 ENCOUNTER — Telehealth: Payer: Self-pay

## 2021-03-17 NOTE — Telephone Encounter (Signed)
Transition Care Management Follow-up Telephone Call Date of discharge and from where: 03/16/21 [name] Diagnosis: Acute Encephalopathy How have you been since you were released from the hospital? Spoke with pt's son [name], he states patient is doing better. Any questions or concerns? No  Items Reviewed: Did the pt receive and understand the discharge instructions provided? Yes  Medications obtained and verified? Yes  Other? No  Any new allergies since your discharge? No  Dietary orders reviewed? Yes Do you have support at home? Yes   Home Care and Equipment/Supplies: Were home health services ordered? not applicable If so, what is the name of the agency? Pt already has [place] established to come in.   Has the agency set up a time to come to the patient's home? not applicable Were any new equipment or medical supplies ordered?  No What is the name of the medical supply agency?  Were you able to get the supplies/equipment? not applicable Do you have any questions related to the use of the equipment or supplies? No  Functional Questionnaire: (I = Independent and D = Dependent) ADLs: D  Bathing/Dressing- D  Meal Prep- D  Eating- I  Maintaining continence- D  Transferring/Ambulation- D  Managing Meds- D  Follow up appointments reviewed:  PCP Hospital f/u appt confirmed? Yes  Scheduled to see Dr. Dennard Schaumann on 03/29/21 @ 4:15. [place] [facility] f/u appt confirmed? No  Are transportation arrangements needed? No  If their condition worsens, is the pt aware to call PCP or go to the Emergency Dept.? Yes Was the patient provided with contact information for the PCP's office or ED? Yes Was to pt encouraged to call back with questions or concerns? Yes

## 2021-03-23 ENCOUNTER — Other Ambulatory Visit (HOSPITAL_COMMUNITY): Payer: Self-pay | Admitting: Cardiology

## 2021-03-23 DIAGNOSIS — I714 Abdominal aortic aneurysm, without rupture, unspecified: Secondary | ICD-10-CM

## 2021-03-29 ENCOUNTER — Other Ambulatory Visit: Payer: Self-pay

## 2021-03-29 ENCOUNTER — Ambulatory Visit (INDEPENDENT_AMBULATORY_CARE_PROVIDER_SITE_OTHER): Payer: 59 | Admitting: Family Medicine

## 2021-03-29 ENCOUNTER — Encounter: Payer: Self-pay | Admitting: Family Medicine

## 2021-03-29 VITALS — BP 112/82 | HR 91 | Temp 97.2°F | Resp 18 | Ht 65.0 in | Wt 170.0 lb

## 2021-03-29 DIAGNOSIS — R8271 Bacteriuria: Secondary | ICD-10-CM | POA: Diagnosis not present

## 2021-03-29 LAB — URINALYSIS, ROUTINE W REFLEX MICROSCOPIC
Bilirubin Urine: NEGATIVE
Glucose, UA: NEGATIVE
Hgb urine dipstick: NEGATIVE
Hyaline Cast: NONE SEEN /LPF
Ketones, ur: NEGATIVE
Nitrite: NEGATIVE
Protein, ur: NEGATIVE
Specific Gravity, Urine: 1.025 (ref 1.001–1.035)
pH: 5.5 (ref 5.0–8.0)

## 2021-03-29 LAB — MICROSCOPIC MESSAGE

## 2021-03-29 MED ORDER — ATENOLOL 25 MG PO TABS: 12.5000 mg | ORAL_TABLET | Freq: Every day | ORAL | 3 refills | Status: DC

## 2021-03-30 NOTE — Progress Notes (Signed)
Subjective:    Patient ID: [name], female    DOB: 07-18-1933, 87 y.o.   MRN: [id number]  HPI   Patient is here today for hospital discharge follow-up.  Patient was taken to the hospital with confusion.  A urine culture again showed ESBL E. coli.  Patient was initially treated with IV meropenem.  However infectious disease was consulted per the discharge summary and they recommended no additional antibiotics suggesting that the patient is colonized given the lack of leukocytosis and the lack of symptoms related to a urinary tract infection.  Therefore it is assumed that the patient has asymptomatic bacteriuria.  Review of her chart reveals urine culture positive for the same species on 04/07/2020, 10/07/2020, 12/29/2020, 02/13/2021, and 03/12/2021.  This would suggest colonization.  However the patient has never had a urine culture while "not confused".  Therefore the son is confused and suggest how to we know that this is asymptomatic bacteriuria/colonization if the patient's confusion improves after she receives antibiotics.  At the present time, the confusion is thought to be multifactorial due to age/dementia/possible UTI. Past Medical History:  Diagnosis Date   [name] (abdominal aortic aneurysm) 09/2013   3.6 cm   Acute and chronic respiratory failure with hypoxia (HCC)    Acute delirium    Acute encephalopathy    Acute pyelonephritis    Atrial fibrillation (HCC)    Chronic pain syndrome    Constipation    Dementia (HCC)    Dependent edema    bilateral legs    Depression    Diabetes mellitus    pre-diabetes   Diverticulosis    Diverticulosis    Dysuria    E coli bacteremia    Fall    Gross hematuria    Hydronephrosis with renal and ureteral calculous obstruction    Hyperlipidemia    Hypertension    Hypoxemia    Idiopathic peripheral autonomic neuropathy    Infestation by bed bug    Nephrolithiasis    OSA on CPAP    Osteoarthritis    Osteopenia    T=  -2.1 in hip    Persistent mood (affective) disorder, unspecified (HCC)    Prediabetes    Rheumatoid arthritis (HCC)    Rheumatoid arthritis(714.0)    S/P carpal tunnel release    Sepsis secondary to UTI [facility])    Spinal stenosis    Unspecified fracture of fifth lumbar vertebra, initial encounter for closed fracture ([place])    Unspecified fracture of t11-T12 vertebra, initial encounter for closed fracture ([place])    Urinary incontinence    Urinary tract infection    hx of    Urolithiasis    Past Surgical History:  Procedure Laterality Date   APPENDECTOMY     CARPAL TUNNEL RELEASE  12/27/2006   Right subcutaneous ulnar nerve transfer -- Right open carpal tunnel release   CHOLECYSTECTOMY     CYSTOSCOPY W/ URETERAL STENT PLACEMENT Right 01/30/2018   Procedure: CYSTOSCOPY WITH STENT REPLACEMENT retrograde pylegram;  Surgeon: Ardis Hughs, MD;  Location: [facility];  Service: Urology;  Laterality: Right;   CYSTOSCOPY WITH URETEROSCOPY AND STENT PLACEMENT Right 03/01/2018   Procedure: CYSTOSCPY/RIGHT URETEROSCOPY HOLMIUM LASER LITHOTRIPSY AND STENT EXCHANGE;  Surgeon: Ardis Hughs, MD;  Location: [facility];  Service: Urology;  Laterality: Right;   FOOT SURGERY     HIP ARTHROPLASTY Left 08/27/2020   Procedure: ARTHROPLASTY BIPOLAR HIP (HEMIARTHROPLASTY);  Surgeon: Mordecai Rasmussen, MD;  Location: [facility];  Service: Orthopedics;  Laterality: Left;   HOLMIUM LASER APPLICATION Right 71/09/2692   Procedure: HOLMIUM LASER APPLICATION;  Surgeon: Ardis Hughs, MD;  Location: [facility];  Service: Urology;  Laterality: Right;   JOINT REPLACEMENT     BTKR, RTHR   TOTAL KNEE ARTHROPLASTY  08/21/2007   Right total knee replacement   Current Outpatient Medications on File Prior to Visit  Medication Sig Dispense Refill   acetaminophen (TYLENOL) 325 MG tablet Take 2 tablets (650 mg total) by mouth every 6 (six) hours.     albuterol (VENTOLIN HFA) 108 (90 Base) MCG/ACT inhaler Inhale 2 puffs into the lungs every 4  (four) hours as needed for wheezing or shortness of breath.     ALPRAZolam (XANAX) 0.25 MG tablet Take 1 tablet (0.25 mg total) by mouth 2 (two) times daily as needed for anxiety. 14 tablet 0   ammonium lactate (AMLACTIN) 12 % cream Apply topically as needed for dry skin. 385 g 0   aspirin EC 81 MG tablet Take 1 po BID x 6 weeks then resume 1 po daily 30 tablet 11   Calcium Carbonate-Vitamin D3 600-400 MG-UNIT TABS Take 1 tablet by mouth daily.     diclofenac sodium (VOLTAREN) 1 % GEL Apply 2 g topically 4 (four) times daily. Rub into affected area of foot 2 to 4 times daily 100 g 2   DULoxetine (CYMBALTA) 30 MG capsule TAKE 1 CAPSULE BY MOUTH DAILY ALONG WITH 60 MG FOR TOTAL DAILY DOSE OF 90 MG 20 capsule 10   DULoxetine (CYMBALTA) 60 MG capsule TAKE 1 CAPSULE BY MOUTH DAILY ALONG WITH 30 MG FOR TOTAL DAILY DOSE OF 90 MG 20 capsule 10   gabapentin (NEURONTIN) 800 MG tablet TAKE 1 TABLET BY MOUTH 3 TIMES DAILY 90 tablet 10   ketoconazole (NIZORAL) 2 % cream Apply 1 application topically daily.     mupirocin ointment (BACTROBAN) 2 % Apply 1 application topically 2 (two) times daily.     oxyCODONE (OXY IR/ROXICODONE) 5 MG immediate release tablet Take 5 mg by mouth every 6 (six) hours as needed for moderate pain.     pantoprazole (PROTONIX) 40 MG tablet TAKE 1 TABLET BY MOUTH ONCE DAILY 30 tablet 10   polyethylene glycol (MIRALAX / GLYCOLAX) 17 g packet Take 17 g by mouth daily as needed for mild constipation or moderate constipation.     rosuvastatin (CRESTOR) 40 MG tablet TAKE 1 TABLET BY MOUTH EACH EVENING 30 tablet 10   No current facility-administered medications on file prior to visit.   Allergies  Allergen Reactions   Sulfonamide Derivatives Hives and Rash   Social History   Socioeconomic History   Marital status: Divorced    Spouse name: Not on file   Number of children: 6   Years of education: 8th grade   Highest education level: Not on file  Occupational History   Occupation:  Retired  Tobacco Use   Smoking status: Never   Smokeless tobacco: Never  Vaping Use   Vaping Use: Never used  Substance and Sexual Activity   Alcohol use: No   Drug use: No   Sexual activity: Not on file  Other Topics Concern   Not on file  Social History Narrative   Lives at home with 2 sons   She has four living children.   Right-handed.   Two cups caffeine per day.   Social Determinants of Health   Financial Resource Strain: Low Risk    Difficulty of Paying Living Expenses: Not  hard at all  Food Insecurity: No Food Insecurity   Worried About Charity fundraiser in the Last Year: Never true   Ran Out of Food in the Last Year: Never true  Transportation Needs: No Transportation Needs   Lack of Transportation (Medical): No   Lack of Transportation (Non-Medical): No  Physical Activity: Inactive   Days of Exercise per Week: 0 days   Minutes of Exercise per Session: 0 min  Stress: No Stress Concern Present   Feeling of Stress : Not at all  Social Connections: Socially Isolated   Frequency of Communication with Friends and Family: Once a week   Frequency of Social Gatherings with Friends and Family: More than three times a week   Attends Religious Services: Never   Marine scientist or Organizations: No   Attends Music therapist: Never   Marital Status: Divorced  Human resources officer Violence: Not At Risk   Fear of Current or Ex-Partner: No   Emotionally Abused: No   Physically Abused: No   Sexually Abused: No     Review of Systems  All other systems reviewed and are negative.     Objective:   Physical Exam Constitutional:      General: She is not in acute distress.    Appearance: Normal appearance. She is normal weight. She is not ill-appearing, toxic-appearing or diaphoretic.  Cardiovascular:     Rate and Rhythm: Normal rate. Rhythm irregular.     Heart sounds: Normal heart sounds. No murmur heard.   No gallop.  Pulmonary:     Effort:  Pulmonary effort is normal. No tachypnea or respiratory distress.     Breath sounds: No wheezing or rhonchi.  Chest:     Chest wall: No tenderness or edema.  Abdominal:     General: There is no distension.     Tenderness: There is no abdominal tenderness. There is no guarding or rebound.  Musculoskeletal:     Right lower leg: No edema.     Left lower leg: No edema.  Neurological:     Mental Status: She is alert.          Assessment & Plan:  Asymptomatic bacteriuria - Plan: Urinalysis, Routine w reflex microscopic, Urine Culture Patient is asymptomatic today.  She states that she feels great.  She is at her baseline mental status.  Son is concerned.  He does not want to avoid treatment if he could cause his mother to deteriorate.  Therefore postulated that if we repeat a urine culture today that shows the same species, it would prove colonization.  Also explained to the patient and her son that treating asymptomatic bacteriuria would not improve her condition or prevent hospitalizations.  Son understands this and feels comfortable repeating urine culture today.  I am using the urine culture to show the patient's son that she is colonized and that we should only treat a urinary tract infection if there are other symptoms besides confusion such as dysuria, leukocytosis, abdominal pain, CVA tenderness, etc.

## 2021-04-02 ENCOUNTER — Other Ambulatory Visit: Payer: Self-pay | Admitting: Family Medicine

## 2021-04-02 DIAGNOSIS — N179 Acute kidney failure, unspecified: Secondary | ICD-10-CM | POA: Diagnosis not present

## 2021-04-02 DIAGNOSIS — I5032 Chronic diastolic (congestive) heart failure: Secondary | ICD-10-CM | POA: Diagnosis not present

## 2021-04-02 DIAGNOSIS — I482 Chronic atrial fibrillation, unspecified: Secondary | ICD-10-CM

## 2021-04-02 DIAGNOSIS — G894 Chronic pain syndrome: Secondary | ICD-10-CM

## 2021-04-02 DIAGNOSIS — J9622 Acute and chronic respiratory failure with hypercapnia: Secondary | ICD-10-CM

## 2021-04-02 DIAGNOSIS — N1831 Chronic kidney disease, stage 3a: Secondary | ICD-10-CM | POA: Diagnosis not present

## 2021-04-02 DIAGNOSIS — I130 Hypertensive heart and chronic kidney disease with heart failure and stage 1 through stage 4 chronic kidney disease, or unspecified chronic kidney disease: Secondary | ICD-10-CM | POA: Diagnosis not present

## 2021-04-02 DIAGNOSIS — K5790 Diverticulosis of intestine, part unspecified, without perforation or abscess without bleeding: Secondary | ICD-10-CM

## 2021-04-02 LAB — URINE CULTURE
MICRO NUMBER:: 12683966
SPECIMEN QUALITY:: ADEQUATE

## 2021-04-05 ENCOUNTER — Other Ambulatory Visit (HOSPITAL_COMMUNITY): Payer: Self-pay | Admitting: Cardiology

## 2021-04-05 ENCOUNTER — Encounter: Payer: Self-pay | Admitting: *Deleted

## 2021-04-05 ENCOUNTER — Other Ambulatory Visit: Payer: Self-pay

## 2021-04-05 ENCOUNTER — Ambulatory Visit (HOSPITAL_COMMUNITY)
Admission: RE | Admit: 2021-04-05 | Discharge: 2021-04-05 | Disposition: A | Discharge status: Home / Self Care | Payer: 59 | Source: Ambulatory Visit | Attending: Cardiology | Admitting: Cardiology

## 2021-04-05 DIAGNOSIS — I714 Abdominal aortic aneurysm, without rupture, unspecified: Secondary | ICD-10-CM | POA: Insufficient documentation

## 2021-04-05 DIAGNOSIS — I7141 Pararenal abdominal aortic aneurysm, without rupture: Secondary | ICD-10-CM | POA: Diagnosis not present

## 2021-04-29 ENCOUNTER — Ambulatory Visit (INDEPENDENT_AMBULATORY_CARE_PROVIDER_SITE_OTHER): Payer: 59 | Admitting: Podiatry

## 2021-04-29 ENCOUNTER — Encounter: Payer: Self-pay | Admitting: Podiatry

## 2021-04-29 ENCOUNTER — Other Ambulatory Visit: Payer: Self-pay

## 2021-04-29 DIAGNOSIS — B351 Tinea unguium: Secondary | ICD-10-CM | POA: Diagnosis not present

## 2021-04-29 DIAGNOSIS — M79676 Pain in unspecified toe(s): Secondary | ICD-10-CM | POA: Diagnosis not present

## 2021-04-29 DIAGNOSIS — E1142 Type 2 diabetes mellitus with diabetic polyneuropathy: Secondary | ICD-10-CM | POA: Diagnosis not present

## 2021-04-29 NOTE — Progress Notes (Signed)
She presents today chief complaint of painful elongated toenails and numbness in her feet.  Objective: Vital signs stable alert oriented x3 pulses are strong and palpable however she does have severe loss of sensation protective sensation vibratory sensation to the plantar aspect of the bilateral foot.  She does have skin peeling to the plantar aspect of the foot but no open lesions.  Nails are thick yellow dystrophic-like mycotic bilaterally.  Muscle tone and strength is normal bilateral.  Orthopedic evaluation demonstrates all joints distal ankle full range of motion without crepitation.  She does have a history of amputation to lesser digits of the right foot.  Assessment: Diabetic peripheral neuropathy with pain in limb secondary onychomycosis.  Plan: Debridement of toenails 1 through 5 bilateral.

## 2021-05-03 NOTE — Progress Notes (Deleted)
Office Visit    Patient Name: [name] Date of Encounter: 05/03/2021  PCP:  [name], MD   [place]  Cardiologist:  Kirk Ruths, MD  Advanced Practice Provider:  No care team member to display Electrophysiologist:  None    Chief Complaint    [name] is a 88 y.o. female with a hx of permanent atrial fibrillation (not on anticoagulation due to multiple falls, currently on ASA), chronic diastolic CHF, CVA (see 07/1285 due to basilar artery stenosis), [name] (4.2 cm in 2020), coronary artery calcification on CT scan, essential hypertension, HL, prediabetes, OSA, GERD, gait instability, and arthritis who was recently admitted to the hospital after a fall at home 07/2020 presents today for annual follow-up visit.   Past Medical History    Past Medical History:  Diagnosis Date   [name] (abdominal aortic aneurysm) 09/2013   3.6 cm   Acute and chronic respiratory failure with hypoxia (HCC)    Acute delirium    Acute encephalopathy    Acute pyelonephritis    Atrial fibrillation (HCC)    Chronic pain syndrome    Constipation    Dementia (HCC)    Dependent edema    bilateral legs    Depression    Diabetes mellitus    pre-diabetes   Diverticulosis    Diverticulosis    Dysuria    E coli bacteremia    Fall    Gross hematuria    Hydronephrosis with renal and ureteral calculous obstruction    Hyperlipidemia    Hypertension    Hypoxemia    Idiopathic peripheral autonomic neuropathy    Infestation by bed bug    Nephrolithiasis    OSA on CPAP    Osteoarthritis    Osteopenia    T=  -2.1 in hip   Persistent mood (affective) disorder, unspecified (HCC)    Prediabetes    Rheumatoid arthritis (HCC)    Rheumatoid arthritis(714.0)    S/P carpal tunnel release    Sepsis secondary to UTI [facility])    Spinal stenosis    Unspecified fracture of fifth lumbar vertebra, initial encounter for closed fracture ([place])    Unspecified fracture of t11-T12  vertebra, initial encounter for closed fracture ([place])    Urinary incontinence    Urinary tract infection    hx of    Urolithiasis    Past Surgical History:  Procedure Laterality Date   APPENDECTOMY     CARPAL TUNNEL RELEASE  12/27/2006   Right subcutaneous ulnar nerve transfer -- Right open carpal tunnel release   CHOLECYSTECTOMY     CYSTOSCOPY W/ URETERAL STENT PLACEMENT Right 01/30/2018   Procedure: CYSTOSCOPY WITH STENT REPLACEMENT retrograde pylegram;  Surgeon: Ardis Hughs, MD;  [facility];  Service: Urology;  Laterality: Right;   CYSTOSCOPY WITH URETEROSCOPY AND STENT PLACEMENT Right 03/01/2018   Procedure: CYSTOSCPY/RIGHT URETEROSCOPY HOLMIUM LASER LITHOTRIPSY AND STENT EXCHANGE;  Surgeon: Ardis Hughs, MD;  Location[facility];  Service: Urology;  Laterality: Right;   FOOT SURGERY     HIP ARTHROPLASTY Left 08/27/2020   Procedure: ARTHROPLASTY BIPOLAR HIP (HEMIARTHROPLASTY);  Surgeon: Mordecai Rasmussen, MD;  Location: [facility];  Service: Orthopedics;  Laterality: Left;   HOLMIUM LASER APPLICATION Right 86/76/7209   Procedure: HOLMIUM LASER APPLICATION;  Surgeon: Ardis Hughs, MD;  [facility];  Service: Urology;  Laterality: Right;   JOINT REPLACEMENT     BTKR, RTHR   TOTAL KNEE ARTHROPLASTY  08/21/2007  Right total knee replacement    Allergies  Allergies  Allergen Reactions   Sulfonamide Derivatives Hives and Rash    History of Present Illness    [name] is a 88 y.o. female with a hx of permanent atrial fibrillation (not on anticoagulation due to multiple falls, currently on ASA), chronic diastolic CHF, CVA (see 0/0867 due to basilar artery stenosis), [name] (4.2 cm in 2020), coronary artery calcification on CT scan, essential hypertension, HL, prediabetes, OSA, GERD, gait instability, and arthritis who was recently admitted to the hospital after a fall at home 07/2020 presents today for annual follow-up visit last seen 07/2020.  In the ED she  was noted to be tachycardic.  Resting oxygen saturation was 88%.  She was placed on 2 L of oxygen via nasal cannula.  Left hip x-ray showed mild displacement and left hip fracture.  CT of the head revealed no evidence of acute intracranial pathology, global parenchymal volume loss and sequelae of microvascular ischemic disease.  Her EKG from 08/25/2020 showed atrial fibrillation, rate 116, low voltage QRS and nonspecific T wave changes laterally.  We were asked to review her chart at this time for cardiac clearance for orthopedic surgery.  She was not noted to have any ischemic changes on EKG.   Today, she ***   Permanent atrial fibrillation -Not on anticoagulation due to multiple falls, currently only on ASA  2.  Chronic diastolic CHF -No evidence of fluid overload -Continue compression socks/stockings -Please call our office if you gain more than 2 to 3 pounds overnight or 5 pounds in 1 week  3.  [name] -4.2 cm measured in 2020 -Continue annual surveillance  4.  Coronary calcification on CT scan  5.  Essential hypertension -BP well controlled (today -Continue to monitor blood pressure at home -Continue low-sodium diet  6.  Hyperlipidemia, LDL less than 70 due to CAD -Last lipid panel 08/2020 showed total cholesterol 101, H LDL 38, triglycerides 103 -LDL at goal, continue Crestor 40 mg daily  7.  Prediabetes -Recent A1c 6.0, 11/2020 -Continue dietary modification  8.  OSA  9.  GERD  EKGs/Labs/Other Studies Reviewed:   The following studies were reviewed today:  Vascular ultrasound duplex for [name] 04/06/2019 -4.2 cm proximal segment of abdominal aortic aneurysm  Echocardiogram 07/10/2019  Left ventricular ejection fraction, by estimation, is 60 to 65%. The left ventricle has normal function. The left ventricle has no regional wall motion abnormalities. There is severe left ventricular hypertrophy. Left ventricular diastolic function could not be evaluated. Left ventricular  diastolic function could not be evaluated. 2. Right ventricular systolic function is low normal. The right ventricular size is normal. There is mildly elevated pulmonary artery systolic pressure. 3. The mitral valve is abnormal. Trivial mitral valve regurgitation. 4. The aortic valve is tricuspid. Aortic valve regurgitation is not visualized. Mild aortic valve sclerosis is present, with no evidence of aortic valve stenosis. 5. Aortic dilatation noted. There is mild dilatation of the aortic root and of the ascending aorta measuring 39 mm. 6. The inferior vena cava is dilated in size with <50% respiratory variability, suggesting right atrial pressure of 15 mmHg. Conclusion(s)/Recommendation(s): Findings consistent with Severe LVH and diastolic dysfunction, consider outpatient amyloidosis workup.  EKG:  EKG is *** ordered today.  The ekg ordered today demonstrates ***  Recent Labs: 08/25/2020: B Natriuretic Peptide 133.0 03/12/2021: ALT 9 03/13/2021: TSH 2.657 03/16/2021: BUN 18; Creatinine, Ser 0.93; Hemoglobin 13.4; Magnesium 1.9; Platelets 183; Potassium 4.1; Sodium 137  Recent  Lipid Panel    Component Value Date/Time   CHOL 101 12/29/2020 1459   TRIG 103 12/29/2020 1459   HDL 44 (L) 12/29/2020 1459   CHOLHDL 2.3 12/29/2020 1459   VLDL 24 07/10/2019 0305   LDLCALC 38 12/29/2020 1459    Risk Assessment/Calculations:  {Does this patient have ATRIAL FIBRILLATION?:[phone]}  Home Medications   No outpatient medications have been marked as taking for the 05/04/21 encounter (Appointment) with Ledora Bottcher, [place].     Review of Systems   ***   All other systems reviewed and are otherwise negative except as noted above.  Physical Exam    VS:  There were no vitals taken for this visit. , BMI There is no height or weight on file to calculate BMI.  Wt Readings from Last 3 Encounters:  03/29/21 170 lb (77.1 kg)  03/12/21 164 lb 0.4 oz (74.4 kg)  02/13/21 164 lb (74.4 kg)      GEN: Well nourished, well developed, in no acute distress. HEENT: normal. Neck: Supple, no JVD, carotid bruits, or masses. Cardiac: ***RRR, no murmurs, rubs, or gallops. No clubbing, cyanosis, edema.  ***Radials/PT 2+ and equal bilaterally.  Respiratory:  ***Respirations regular and unlabored, clear to auscultation bilaterally. GI: Soft, nontender, nondistended. MS: No deformity or atrophy. Skin: Warm and dry, no rash. Neuro:  Strength and sensation are intact. Psych: Normal affect.  Assessment & Plan     Disposition: Follow up {follow up:15908} with Kirk Ruths, MD or APP.  Signed, Elgie Collard, PA-C 05/03/2021, 8:11 PM [facility]

## 2021-05-04 ENCOUNTER — Ambulatory Visit: Payer: 59 | Admitting: Physician Assistant

## 2021-05-04 DIAGNOSIS — I482 Chronic atrial fibrillation, unspecified: Secondary | ICD-10-CM

## 2021-05-04 DIAGNOSIS — I714 Abdominal aortic aneurysm, without rupture, unspecified: Secondary | ICD-10-CM

## 2021-05-04 DIAGNOSIS — E785 Hyperlipidemia, unspecified: Secondary | ICD-10-CM

## 2021-05-04 DIAGNOSIS — K219 Gastro-esophageal reflux disease without esophagitis: Secondary | ICD-10-CM

## 2021-05-04 DIAGNOSIS — R7303 Prediabetes: Secondary | ICD-10-CM

## 2021-05-04 DIAGNOSIS — I5032 Chronic diastolic (congestive) heart failure: Secondary | ICD-10-CM

## 2021-05-04 DIAGNOSIS — I2510 Atherosclerotic heart disease of native coronary artery without angina pectoris: Secondary | ICD-10-CM

## 2021-05-04 DIAGNOSIS — G4733 Obstructive sleep apnea (adult) (pediatric): Secondary | ICD-10-CM

## 2021-05-31 ENCOUNTER — Other Ambulatory Visit: Payer: Self-pay | Admitting: Family Medicine

## 2021-05-31 DIAGNOSIS — I6381 Other cerebral infarction due to occlusion or stenosis of small artery: Secondary | ICD-10-CM

## 2021-06-27 ENCOUNTER — Other Ambulatory Visit: Payer: Self-pay | Admitting: Family Medicine

## 2021-06-27 DIAGNOSIS — R8271 Bacteriuria: Secondary | ICD-10-CM

## 2021-07-13 ENCOUNTER — Emergency Department (HOSPITAL_COMMUNITY)
Admission: EM | Admit: 2021-07-13 | Discharge: 2021-07-13 | Disposition: A | Discharge status: Home / Self Care | Payer: 59 | Attending: Emergency Medicine | Admitting: Emergency Medicine

## 2021-07-13 ENCOUNTER — Encounter (HOSPITAL_COMMUNITY): Payer: Self-pay

## 2021-07-13 ENCOUNTER — Emergency Department (HOSPITAL_COMMUNITY): Payer: 59

## 2021-07-13 ENCOUNTER — Other Ambulatory Visit: Payer: Self-pay

## 2021-07-13 DIAGNOSIS — N390 Urinary tract infection, site not specified: Secondary | ICD-10-CM | POA: Diagnosis not present

## 2021-07-13 DIAGNOSIS — Z20822 Contact with and (suspected) exposure to covid-19: Secondary | ICD-10-CM | POA: Diagnosis not present

## 2021-07-13 DIAGNOSIS — Z87440 Personal history of urinary (tract) infections: Secondary | ICD-10-CM | POA: Insufficient documentation

## 2021-07-13 DIAGNOSIS — J9610 Chronic respiratory failure, unspecified whether with hypoxia or hypercapnia: Secondary | ICD-10-CM | POA: Insufficient documentation

## 2021-07-13 DIAGNOSIS — Z7982 Long term (current) use of aspirin: Secondary | ICD-10-CM | POA: Diagnosis not present

## 2021-07-13 DIAGNOSIS — R4182 Altered mental status, unspecified: Secondary | ICD-10-CM | POA: Diagnosis present

## 2021-07-13 LAB — PROTIME-INR
INR: 1.1 (ref 0.8–1.2)
Prothrombin Time: 14.3 seconds (ref 11.4–15.2)

## 2021-07-13 LAB — COMPREHENSIVE METABOLIC PANEL
ALT: 14 U/L (ref 0–44)
AST: 22 U/L (ref 15–41)
Albumin: 3.5 g/dL (ref 3.5–5.0)
Alkaline Phosphatase: 52 U/L (ref 38–126)
Anion gap: 8 (ref 5–15)
BUN: 20 mg/dL (ref 8–23)
CO2: 30 mmol/L (ref 22–32)
Calcium: 9 mg/dL (ref 8.9–10.3)
Chloride: 96 mmol/L — ABNORMAL LOW (ref 98–111)
Creatinine, Ser: 1.08 mg/dL — ABNORMAL HIGH (ref 0.44–1.00)
GFR, Estimated: 50 mL/min — ABNORMAL LOW (ref >60)
Glucose, Bld: 107 mg/dL — ABNORMAL HIGH (ref 70–99)
Potassium: 4.5 mmol/L (ref 3.5–5.1)
Sodium: 134 mmol/L — ABNORMAL LOW (ref 135–145)
Total Bilirubin: 1.1 mg/dL (ref 0.3–1.2)
Total Protein: 7.1 g/dL (ref 6.5–8.1)

## 2021-07-13 LAB — URINALYSIS, ROUTINE W REFLEX MICROSCOPIC
Bilirubin Urine: NEGATIVE
Glucose, UA: NEGATIVE mg/dL
Ketones, ur: NEGATIVE mg/dL
Leukocytes,Ua: NEGATIVE
Nitrite: POSITIVE — AB
Protein, ur: NEGATIVE mg/dL
Specific Gravity, Urine: 1.016 (ref 1.005–1.030)
pH: 7 (ref 5.0–8.0)

## 2021-07-13 LAB — CBC WITH DIFFERENTIAL/PLATELET
Abs Immature Granulocytes: 0.02 10*3/uL (ref 0.00–0.07)
Basophils Absolute: 0.1 10*3/uL (ref 0.0–0.1)
Basophils Relative: 1 %
Eosinophils Absolute: 0.2 10*3/uL (ref 0.0–0.5)
Eosinophils Relative: 2 %
HCT: 41 % (ref 36.0–46.0)
Hemoglobin: 12.9 g/dL (ref 12.0–15.0)
Immature Granulocytes: 0 %
Lymphocytes Relative: 29 %
Lymphs Abs: 1.9 10*3/uL (ref 0.7–4.0)
MCH: 28.9 pg (ref 26.0–34.0)
MCHC: 31.5 g/dL (ref 30.0–36.0)
MCV: 91.9 fL (ref 80.0–100.0)
Monocytes Absolute: 0.4 10*3/uL (ref 0.1–1.0)
Monocytes Relative: 5 %
Neutro Abs: 4.1 10*3/uL (ref 1.7–7.7)
Neutrophils Relative %: 63 %
Platelets: 152 10*3/uL (ref 150–400)
RBC: 4.46 MIL/uL (ref 3.87–5.11)
RDW: 13.3 % (ref 11.5–15.5)
WBC: 6.5 10*3/uL (ref 4.0–10.5)
nRBC: 0 % (ref 0.0–0.2)

## 2021-07-13 LAB — RESP PANEL BY RT-PCR (FLU A&B, COVID) ARPGX2
Influenza A by PCR: NEGATIVE
Influenza B by PCR: NEGATIVE
SARS Coronavirus 2 by RT PCR: NEGATIVE

## 2021-07-13 LAB — TROPONIN I (HIGH SENSITIVITY)
Troponin I (High Sensitivity): 7 ng/L (ref <18)
Troponin I (High Sensitivity): 6 ng/L (ref <18)

## 2021-07-13 LAB — BRAIN NATRIURETIC PEPTIDE: B Natriuretic Peptide: 50 pg/mL (ref 0.0–100.0)

## 2021-07-13 LAB — LACTIC ACID, PLASMA
Lactic Acid, Venous: 1.8 mmol/L (ref 0.5–1.9)
Lactic Acid, Venous: 1.6 mmol/L (ref 0.5–1.9)

## 2021-07-13 LAB — APTT: aPTT: 28 seconds (ref 24–36)

## 2021-07-13 MED ORDER — FOSFOMYCIN TROMETHAMINE 3 G PO PACK
3.0000 g | PACK | Freq: Once | ORAL | Status: AC
  Administered 2021-07-13: 3 g via ORAL
  Filled 2021-07-13: qty 3

## 2021-07-13 NOTE — ED Triage Notes (Signed)
Pt arrived from home via [facility] with family having complaints of Altered Mental Status for the past two days. Family states that her urine has had a strong odor and pt has hx of UTIs in past.  ?

## 2021-07-13 NOTE — ED Provider Notes (Signed)
?[facility] ?Provider Note ? ? ?CSN: [id number] ?Arrival date & time: 07/13/21  5784 ? ?  ? ?History ? ?Chief Complaint  ?Patient presents with  ? Altered Mental Status  ? ? ?[name] is a 88 y.o. female. ? ?Patient brought to the emergency department from home by ambulance.  Family called ambulance because she has been confused for 2 days.  Family reports that they have noticed a foul odor to her urine and she has had similar behavior in the past with urinary tract infection. ? ? ?  ? ?Home Medications ?Prior to Admission medications   ?Medication Sig Start Date End Date Taking? Authorizing Provider  ?acetaminophen (TYLENOL) 325 MG tablet Take 2 tablets (650 mg total) by mouth every 6 (six) hours. 09/01/20   Johnson, Clanford L, MD  ?albuterol (VENTOLIN HFA) 108 (90 Base) MCG/ACT inhaler Inhale 2 puffs into the lungs every 4 (four) hours as needed for wheezing or shortness of breath. 09/02/20   [provider]  ?ALPRAZolam Duanne Moron) 0.25 MG tablet Take 1 tablet (0.25 mg total) by mouth 2 (two) times daily as needed for anxiety. 09/01/20   Johnson, Clanford L, MD  ?ammonium lactate (AMLACTIN) 12 % cream Apply topically as needed for dry skin. 08/17/18   Trula Slade, DPM  ?aspirin EC 81 MG tablet Take 1 po BID x 6 weeks then resume 1 po daily 09/01/20   Irwin Brakeman L, MD  ?atenolol (TENORMIN) 25 MG tablet TAKE 1/2 TABLET BY MOUTH DAILY 06/28/21   [name], MD  ?Calcium Carbonate-Vitamin D3 600-400 MG-UNIT TABS Take 1 tablet by mouth daily. 02/04/20   [provider]  ?diclofenac sodium (VOLTAREN) 1 % GEL Apply 2 g topically 4 (four) times daily. Rub into affected area of foot 2 to 4 times daily 08/17/18   Trula Slade, DPM  ?DULoxetine (CYMBALTA) 30 MG capsule TAKE 1 CAPSULE BY MOUTH DAILY WITH 60 MG FOR TOTAL DAILY DOSE OF 90 MG 04/02/21   [name], MD  ?DULoxetine (CYMBALTA) 60 MG capsule TAKE 1 CAPSULE BY MOUTH DAILY WITH 30 MG FOR TOTAL DAILY DOSE  OF 90 MG 04/02/21   [name], MD  ?gabapentin (NEURONTIN) 800 MG tablet TAKE 1 TABLET BY MOUTH 3 TIMES DAILY 01/13/21   [name], MD  ?ketoconazole (NIZORAL) 2 % cream Apply 1 application topically daily. 02/25/21   [provider]  ?mupirocin ointment (BACTROBAN) 2 % Apply 1 application topically 2 (two) times daily. 02/25/21   [provider]  ?oxyCODONE (OXY IR/ROXICODONE) 5 MG immediate release tablet Take 5 mg by mouth every 6 (six) hours as needed for moderate pain. 09/07/20   [provider]  ?pantoprazole (PROTONIX) 40 MG tablet TAKE 1 TABLET BY MOUTH ONCE DAILY 12/10/20   [name], MD  ?polyethylene glycol (MIRALAX / GLYCOLAX) 17 g packet Take 17 g by mouth daily as needed for mild constipation or moderate constipation.    [provider]  ?rosuvastatin (CRESTOR) 40 MG tablet TAKE 1 TABLET BY MOUTH EACH EVENING 05/31/21   [name], MD  ?   ? ?Allergies    ?Sulfonamide derivatives   ? ?Review of Systems   ?Review of Systems  ?Psychiatric/Behavioral:  Positive for confusion.   ? ?Physical Exam ?Updated Vital Signs ?BP 115/63   Pulse (!) 102   Temp 98 ?F (36.7 ?C) (Oral)   Resp 16   Ht '5\' 5"'$  (1.651 m)   Wt  77.1 kg   SpO2 94%   BMI 28.29 kg/m?  ?Physical Exam ?Vitals and nursing note reviewed.  ?Constitutional:   ?   General: She is not in acute distress. ?   Appearance: She is well-developed.  ?HENT:  ?   Head: Normocephalic and atraumatic.  ?   Mouth/Throat:  ?   Mouth: Mucous membranes are moist.  ?Eyes:  ?   General: Vision grossly intact. Gaze aligned appropriately.  ?   Extraocular Movements: Extraocular movements intact.  ?   Conjunctiva/sclera: Conjunctivae normal.  ?Cardiovascular:  ?   Rate and Rhythm: Normal rate and regular rhythm.  ?   Pulses: Normal pulses.  ?   Heart sounds: Normal heart sounds, S1 normal and S2 normal. No murmur heard. ?  No friction rub. No gallop.  ?Pulmonary:  ?   Effort: Pulmonary effort is normal. No  respiratory distress.  ?   Breath sounds: Normal breath sounds.  ?Abdominal:  ?   General: Bowel sounds are normal.  ?   Palpations: Abdomen is soft.  ?   Tenderness: There is no abdominal tenderness. There is no guarding or rebound.  ?   Hernia: No hernia is present.  ?Musculoskeletal:     ?   General: No swelling.  ?   Cervical back: Full passive range of motion without pain, normal range of motion and neck supple. No spinous process tenderness or muscular tenderness. Normal range of motion.  ?   Right lower leg: No edema.  ?   Left lower leg: No edema.  ?Skin: ?   General: Skin is warm and dry.  ?   Capillary Refill: Capillary refill takes less than 2 seconds.  ?   Findings: No ecchymosis, erythema, rash or wound.  ?Neurological:  ?   General: No focal deficit present.  ?   Mental Status: She is alert. She is confused.  ?   GCS: GCS eye subscore is 4. GCS verbal subscore is 4. GCS motor subscore is 6.  ?   Cranial Nerves: Cranial nerves 2-12 are intact.  ?   Sensory: Sensation is intact.  ?   Motor: Motor function is intact.  ?   Coordination: Coordination is intact.  ?Psychiatric:     ?   Attention and Perception: Attention normal.     ?   Mood and Affect: Mood normal.     ?   Speech: Speech normal.     ?   Behavior: Behavior normal.  ? ? ?ED Results / Procedures / Treatments   ?Labs ?(all labs ordered are listed, but only abnormal results are displayed) ?Labs Reviewed  ?COMPREHENSIVE METABOLIC PANEL - Abnormal; Notable for the following components:  ?    Result Value  ? Sodium 134 (*)   ? Chloride 96 (*)   ? Glucose, Bld 107 (*)   ? Creatinine, Ser 1.08 (*)   ? GFR, Estimated 50 (*)   ? All other components within normal limits  ?URINALYSIS, ROUTINE W REFLEX MICROSCOPIC - Abnormal; Notable for the following components:  ? APPearance HAZY (*)   ? Hgb urine dipstick SMALL (*)   ? Nitrite POSITIVE (*)   ? Bacteria, UA RARE (*)   ? All other components within normal limits  ?RESP PANEL BY RT-PCR (FLU A&B, COVID)  ARPGX2  ?CULTURE, BLOOD (ROUTINE X 2)  ?CULTURE, BLOOD (ROUTINE X 2)  ?URINE CULTURE  ?LACTIC ACID, PLASMA  ?LACTIC ACID, PLASMA  ?CBC WITH DIFFERENTIAL/PLATELET  ?PROTIME-INR  ?APTT  ?  BRAIN NATRIURETIC PEPTIDE  ?TROPONIN I (HIGH SENSITIVITY)  ?TROPONIN I (HIGH SENSITIVITY)  ? ? ?EKG ?EKG Interpretation ? ?Date/Time:  Tuesday July 13 2021 03:32:34 EDT ?Ventricular Rate:  94 ?PR Interval:    ?QRS Duration: 81 ?QT Interval:  335 ?QTC Calculation: 426 ?R Axis:   29 ?Text Interpretation: Atrial fibrillation Low voltage, precordial leads Confirmed by Orpah Greek [phone]) on 07/13/2021 6:04:38 AM ? ?Radiology ?DG Chest Port 1 View ? ?Result Date: 07/13/2021 ?CLINICAL DATA:  88 year old female with possible sepsis. Altered mental status and strongly urine odor. EXAM: PORTABLE CHEST 1 VIEW COMPARISON:  Portable chest 02/13/2021.  Chest CTA 07/09/2019. FINDINGS: Portable AP upright view at 0422 hours. Calcified aortic atherosclerosis. Tortuous thoracic aorta appears stable. Other mediastinal contours are within normal limits. Visualized tracheal air column is within normal limits. Allowing for portable technique the lungs are clear. No pneumothorax or definite pleural effusion. Osteopenia. No acute osseous abnormality identified. Paucity of bowel gas in the upper abdomen. IMPRESSION: No acute cardiopulmonary abnormality. Aortic Atherosclerosis (ICD10-I70.0). Electronically Signed   By: Genevie Ann M.D.   On: 07/13/2021 04:42   ? ?Procedures ?Procedures  ? ? ?Medications Ordered in ED ?Medications  ?fosfomycin (MONUROL) packet 3 g (has no administration in time range)  ? ? ?ED Course/ Medical Decision Making/ A&P ?  ?                        ?Medical Decision Making ?Amount and/or Complexity of Data Reviewed ?Independent Historian: caregiver and EMS ?Labs: ordered. Decision-making details documented in ED Course. ?Radiology: ordered and independent interpretation performed. Decision-making details documented in ED  Course. ?ECG/medicine tests: ordered and independent interpretation performed. Decision-making details documented in ED Course. ? ?Risk ?Prescription drug management. ? ? ?Presents to the emergency department with in

## 2021-07-13 NOTE — Discharge Instructions (Addendum)
Follow-up with primary doctor this week for a recheck.  If any symptoms including confusion worsen or if she develops a fever, return to the ER immediately. ?

## 2021-07-15 LAB — URINE CULTURE: Culture: >=100000 — AB

## 2021-07-16 ENCOUNTER — Telehealth: Payer: Self-pay | Admitting: Emergency Medicine

## 2021-07-16 NOTE — Telephone Encounter (Signed)
Post ED Visit - Positive Culture Follow-up ? ?Culture report reviewed by antimicrobial stewardship pharmacist: ?[place] Team ?'[]'$  Elenor Quinones, Pharm.D. ?'[x]'$  Heide Guile, Pharm.D., BCPS AQ-ID ?'[]'$  Parks Neptune, Pharm.D., BCPS ?'[]'$  Alycia Rossetti, Pharm.D., BCPS ?'[]'$  [place], Pharm.D., BCPS, AAHIVP ?'[]'$  Legrand Como, Pharm.D., BCPS, AAHIVP ?'[]'$  Salome Arnt, PharmD, BCPS ?'[]'$  Johnnette Gourd, PharmD, BCPS ?'[]'$  Hughes Better, PharmD, BCPS ?'[]'$  Leeroy Cha, PharmD ?'[]'$  Laqueta Linden, PharmD, BCPS ?'[]'$  Albertina Parr, PharmD ? ?[place] Team ?'[]'$  Leodis Sias, PharmD ?'[]'$  Lindell Spar, PharmD ?'[]'$  Royetta Asal, PharmD ?'[]'$  Graylin Shiver, Rph ?'[]'$  Rema Fendt) Glennon Mac, PharmD ?'[]'$  Arlyn Dunning, PharmD ?'[]'$  Netta Cedars, PharmD ?'[]'$  Dia Sitter, PharmD ?'[]'$  Leone Haven, PharmD ?'[]'$  Gretta Arab, PharmD ?'[]'$  Theodis Shove, PharmD ?'[]'$  Peggyann Juba, PharmD ?'[]'$  Reuel Boom, PharmD ? ? ?Positive urine culture ?Treated with Fosfomycin. No further patient follow-up is required at this time. ? ?Milus Mallick ?07/16/2021, 1:08 PM ?  ?

## 2021-07-18 LAB — CULTURE, BLOOD (ROUTINE X 2)
Culture: NO GROWTH
Culture: NO GROWTH
Special Requests: ADEQUATE

## 2021-07-29 ENCOUNTER — Ambulatory Visit: Payer: 59 | Admitting: Podiatry

## 2021-08-24 ENCOUNTER — Telehealth: Payer: Self-pay | Admitting: *Deleted

## 2021-08-24 NOTE — Chronic Care Management (AMB) (Signed)
?  Care Management  ? ?Note ? ?08/24/2021 ?Name: [name] MRN: [id number] DOB: [birth year] ? ?[name] is a 88 y.o. year old female who is a primary care patient of Dennard Schaumann, [name], MD and is actively engaged with the care management team. I reached out to [name] by phone today to assist with re-scheduling a follow up visit with the RN Case Manager ? ?Follow up plan: ?Unsuccessful telephone outreach attempt made. A HIPAA compliant phone message was left for the patient providing contact information and requesting a return call.  ?The care management team will reach out to the patient again over the next 7 days.  ?If patient returns call to provider office, please advise to call Lea at [phone] ? ?Laverda Sorenson  ?Care Guide, Embedded Care Coordination ?[facility]  Care Management  ?Direct Dial: [phone] ? ?

## 2021-08-26 NOTE — Chronic Care Management (AMB) (Signed)
?  Care Management  ? ?Note ? ?08/26/2021 ?Name: [name] MRN: [id number] DOB: Mar 01, 1934 ? ?[name] is a 88 y.o. year old female who is a primary care patient of [name], Yvonne Mcgee, MD and is actively engaged with the care management team. I reached out to [name] by phone today to assist with re-scheduling a follow up visit with the RN Case Manager ? ?Follow up plan: ?Telephone appointment with care management team member scheduled for:09/07/21 ? ?[name]  ?Care Guide, Embedded Care Coordination ?[facility]  Care Management  ?Direct Dial: [phone] ? ?

## 2021-09-01 ENCOUNTER — Telehealth: Payer: Self-pay

## 2021-09-01 NOTE — Telephone Encounter (Signed)
Pt's son called in regarding changing the quantity for Cymbalta up to 30cap instead of 20caps. Otherwise the medication does not last pt for the month.  ? ?Please call in new Rx  ?

## 2021-09-02 ENCOUNTER — Emergency Department (HOSPITAL_COMMUNITY): Payer: 59

## 2021-09-02 ENCOUNTER — Emergency Department (HOSPITAL_COMMUNITY)
Admission: EM | Admit: 2021-09-02 | Discharge: 2021-09-02 | Disposition: A | Discharge status: Home / Self Care | Payer: 59 | Attending: Emergency Medicine | Admitting: Emergency Medicine

## 2021-09-02 ENCOUNTER — Encounter (HOSPITAL_COMMUNITY): Payer: Self-pay

## 2021-09-02 ENCOUNTER — Other Ambulatory Visit: Payer: Self-pay

## 2021-09-02 DIAGNOSIS — F0390 Unspecified dementia without behavioral disturbance: Secondary | ICD-10-CM | POA: Diagnosis not present

## 2021-09-02 DIAGNOSIS — R079 Chest pain, unspecified: Secondary | ICD-10-CM

## 2021-09-02 DIAGNOSIS — J069 Acute upper respiratory infection, unspecified: Secondary | ICD-10-CM | POA: Diagnosis not present

## 2021-09-02 DIAGNOSIS — Z96642 Presence of left artificial hip joint: Secondary | ICD-10-CM | POA: Insufficient documentation

## 2021-09-02 DIAGNOSIS — Z96651 Presence of right artificial knee joint: Secondary | ICD-10-CM | POA: Insufficient documentation

## 2021-09-02 DIAGNOSIS — E119 Type 2 diabetes mellitus without complications: Secondary | ICD-10-CM | POA: Insufficient documentation

## 2021-09-02 DIAGNOSIS — Z79899 Other long term (current) drug therapy: Secondary | ICD-10-CM | POA: Diagnosis not present

## 2021-09-02 DIAGNOSIS — Z7982 Long term (current) use of aspirin: Secondary | ICD-10-CM | POA: Diagnosis not present

## 2021-09-02 DIAGNOSIS — I10 Essential (primary) hypertension: Secondary | ICD-10-CM | POA: Insufficient documentation

## 2021-09-02 DIAGNOSIS — Z20822 Contact with and (suspected) exposure to covid-19: Secondary | ICD-10-CM | POA: Insufficient documentation

## 2021-09-02 LAB — BASIC METABOLIC PANEL
Anion gap: 7 (ref 5–15)
BUN: 20 mg/dL (ref 8–23)
CO2: 29 mmol/L (ref 22–32)
Calcium: 8.8 mg/dL — ABNORMAL LOW (ref 8.9–10.3)
Chloride: 102 mmol/L (ref 98–111)
Creatinine, Ser: 1.13 mg/dL — ABNORMAL HIGH (ref 0.44–1.00)
GFR, Estimated: 48 mL/min — ABNORMAL LOW (ref >60)
Glucose, Bld: 163 mg/dL — ABNORMAL HIGH (ref 70–99)
Potassium: 4 mmol/L (ref 3.5–5.1)
Sodium: 138 mmol/L (ref 135–145)

## 2021-09-02 LAB — CBC
HCT: 42.6 % (ref 36.0–46.0)
Hemoglobin: 12.9 g/dL (ref 12.0–15.0)
MCH: 27.9 pg (ref 26.0–34.0)
MCHC: 30.3 g/dL (ref 30.0–36.0)
MCV: 92 fL (ref 80.0–100.0)
Platelets: 141 10*3/uL — ABNORMAL LOW (ref 150–400)
RBC: 4.63 MIL/uL (ref 3.87–5.11)
RDW: 13.5 % (ref 11.5–15.5)
WBC: 6.1 10*3/uL (ref 4.0–10.5)
nRBC: 0 % (ref 0.0–0.2)

## 2021-09-02 LAB — RESP PANEL BY RT-PCR (FLU A&B, COVID) ARPGX2
Influenza A by PCR: NEGATIVE
Influenza B by PCR: NEGATIVE
SARS Coronavirus 2 by RT PCR: NEGATIVE

## 2021-09-02 LAB — TROPONIN I (HIGH SENSITIVITY)
Troponin I (High Sensitivity): 6 ng/L (ref <18)
Troponin I (High Sensitivity): 6 ng/L (ref <18)

## 2021-09-02 LAB — D-DIMER, QUANTITATIVE: D-Dimer, Quant: 1.24 ug/mL-FEU — ABNORMAL HIGH (ref 0.00–0.50)

## 2021-09-02 MED ORDER — IOHEXOL 350 MG/ML SOLN
75.0000 mL | Freq: Once | INTRAVENOUS | Status: AC | PRN
  Administered 2021-09-02: 75 mL via INTRAVENOUS

## 2021-09-02 NOTE — ED Triage Notes (Signed)
Reports cp x 2 days.  Reports sob.  No noe noted at present.  Reports hurts worse when she breaths.  Resp even and unlabored.    Pt is alert and oriented.  Denies fever.  Reports cough and runny nose.  ?

## 2021-09-02 NOTE — ED Provider Notes (Signed)
This patient is an 88 year old female, she has no known history of cardiac disease, she presents to the hospital with a complaint of chest pain that started approximately 3 days ago, intermittent, worse with deep breathing, not associated with coughing though she does feel little short of breath.  She denies any significant swelling in her legs or fevers or chills. ? ?EKG performed on Sep 02, 2021 at 1340 2 PM shows heart rate of 101, rhythm appears to be atrial fibrillation, normal axis, normal intervals, normal ST segments, normal T waves, unremarkable otherwise. ? ?The patient is on a baby aspirin, ? ?Compared to 3/23 - no new changes. ? ?Medical Screening Exam initiated and feel Pt stable for completion of MSE by EDP when room available. ?  ?Noemi Chapel, MD ?09/02/21 1452 ? ?

## 2021-09-02 NOTE — ED Provider Notes (Signed)
?[facility] ?Provider Note ? ? ?CSN: [id number] ?Arrival date & time: 09/02/21  1321 ? ?  ? ?History ? ?Chief Complaint  ?Patient presents with  ? Chest Pain  ? ? ?[name] is a 88 y.o. female. ? ? ?Chest Pain ?Associated symptoms: cough and shortness of breath   ?Associated symptoms: no fatigue and no fever   ?Patient presents with chest pain and shortness of breath.  Has had for around 3 days.  Worse with breathing.  Has had a little bit of a cough.  States she is not having fevers.  No known sick contacts.  Does have apparent history of atrial fibrillation.  Does not appear to be on blood thinners.  Has pain in both legs but not necessarily swelling.  Not worse with exertion.  Pain is worse with palpation. ?  ?Past Medical History:  ?Diagnosis Date  ? [name] (abdominal aortic aneurysm) ([place]) 09/2013  ? 3.6 cm  ? Acute and chronic respiratory failure with hypoxia (HCC)   ? Acute delirium   ? Acute encephalopathy   ? Acute pyelonephritis   ? Atrial fibrillation ([place])   ? Chronic pain syndrome   ? Constipation   ? Dementia ([place])   ? Dependent edema   ? bilateral legs   ? Depression   ? Diabetes mellitus   ? pre-diabetes  ? Diverticulosis   ? Diverticulosis   ? Dysuria   ? E coli bacteremia   ? Fall   ? Gross hematuria   ? Hydronephrosis with renal and ureteral calculous obstruction   ? Hyperlipidemia   ? Hypertension   ? Hypoxemia   ? Idiopathic peripheral autonomic neuropathy   ? Infestation by bed bug   ? Nephrolithiasis   ? OSA on CPAP   ? Osteoarthritis   ? Osteopenia   ? T=  -2.1 in hip  ? Persistent mood (affective) disorder, unspecified ([place])   ? Prediabetes   ? Rheumatoid arthritis ([place])   ? Rheumatoid arthritis(714.0)   ? S/P carpal tunnel release   ? Sepsis secondary to UTI [facility])   ? Spinal stenosis   ? Unspecified fracture of fifth lumbar vertebra, initial encounter for closed fracture ([place])   ? Unspecified fracture of t11-T12 vertebra, initial encounter for closed fracture ([place])   ?  Urinary incontinence   ? Urinary tract infection   ? hx of   ? Urolithiasis   ? ?Past Surgical History:  ?Procedure Laterality Date  ? APPENDECTOMY    ? CARPAL TUNNEL RELEASE  12/27/2006  ? Right subcutaneous ulnar nerve transfer -- Right open carpal tunnel release  ? CHOLECYSTECTOMY    ? CYSTOSCOPY W/ URETERAL STENT PLACEMENT Right 01/30/2018  ? Procedure: CYSTOSCOPY WITH STENT REPLACEMENT retrograde pylegram;  Surgeon: Ardis Hughs, MD;  Location: [facility];  Service: Urology;  Laterality: Right;  ? CYSTOSCOPY WITH URETEROSCOPY AND STENT PLACEMENT Right 03/01/2018  ? Procedure: CYSTOSCPY/RIGHT URETEROSCOPY HOLMIUM LASER LITHOTRIPSY AND STENT EXCHANGE;  Surgeon: Ardis Hughs, MD;  Location: [facility];  Service: Urology;  Laterality: Right;  ? FOOT SURGERY    ? HIP ARTHROPLASTY Left 08/27/2020  ? Procedure: ARTHROPLASTY BIPOLAR HIP (HEMIARTHROPLASTY);  Surgeon: Mordecai Rasmussen, MD;  Location: [facility];  Service: Orthopedics;  Laterality: Left;  ? HOLMIUM LASER APPLICATION Right 64/40/3474  ? Procedure: HOLMIUM LASER APPLICATION;  Surgeon: Ardis Hughs, MD;  Location: [facility];  Service: Urology;  Laterality: Right;  ? JOINT REPLACEMENT    ? BTKR, RTHR  ?  TOTAL KNEE ARTHROPLASTY  08/21/2007  ? Right total knee replacement  ? ? ?Home Medications ?Prior to Admission medications   ?Medication Sig Start Date End Date Taking? Authorizing Provider  ?acetaminophen (TYLENOL) 325 MG tablet Take 2 tablets (650 mg total) by mouth every 6 (six) hours. 09/01/20   Johnson, Clanford L, MD  ?albuterol (VENTOLIN HFA) 108 (90 Base) MCG/ACT inhaler Inhale 2 puffs into the lungs every 4 (four) hours as needed for wheezing or shortness of breath. 09/02/20   [provider]  ?ALPRAZolam Duanne Moron) 0.25 MG tablet Take 1 tablet (0.25 mg total) by mouth 2 (two) times daily as needed for anxiety. 09/01/20   Johnson, Clanford L, MD  ?ammonium lactate (AMLACTIN) 12 % cream Apply topically as needed for dry skin. 08/17/18   Trula Slade, DPM  ?aspirin EC 81 MG tablet Take 1 po BID x 6 weeks then resume 1 po daily 09/01/20   Irwin Brakeman L, MD  ?atenolol (TENORMIN) 25 MG tablet TAKE 1/2 TABLET BY MOUTH DAILY 06/28/21   [name], MD  ?Calcium Carbonate-Vitamin D3 600-400 MG-UNIT TABS Take 1 tablet by mouth daily. 02/04/20   [provider]  ?diclofenac sodium (VOLTAREN) 1 % GEL Apply 2 g topically 4 (four) times daily. Rub into affected area of foot 2 to 4 times daily 08/17/18   Trula Slade, DPM  ?DULoxetine (CYMBALTA) 30 MG capsule TAKE 1 CAPSULE BY MOUTH DAILY WITH 60 MG FOR TOTAL DAILY DOSE OF 90 MG 04/02/21   [name], MD  ?DULoxetine (CYMBALTA) 60 MG capsule TAKE 1 CAPSULE BY MOUTH DAILY WITH 30 MG FOR TOTAL DAILY DOSE OF 90 MG 04/02/21   [name], MD  ?gabapentin (NEURONTIN) 800 MG tablet TAKE 1 TABLET BY MOUTH 3 TIMES DAILY 01/13/21   [name], MD  ?ketoconazole (NIZORAL) 2 % cream Apply 1 application topically daily. 02/25/21   [provider]  ?mupirocin ointment (BACTROBAN) 2 % Apply 1 application topically 2 (two) times daily. 02/25/21   [provider]  ?oxyCODONE (OXY IR/ROXICODONE) 5 MG immediate release tablet Take 5 mg by mouth every 6 (six) hours as needed for moderate pain. 09/07/20   [provider]  ?pantoprazole (PROTONIX) 40 MG tablet TAKE 1 TABLET BY MOUTH ONCE DAILY 12/10/20   [name], MD  ?polyethylene glycol (MIRALAX / GLYCOLAX) 17 g packet Take 17 g by mouth daily as needed for mild constipation or moderate constipation.    [provider]  ?rosuvastatin (CRESTOR) 40 MG tablet TAKE 1 TABLET BY MOUTH EACH EVENING 05/31/21   [name], MD  ?   ? ?Allergies    ?Sulfonamide derivatives   ? ?Review of Systems   ?Review of Systems  ?Constitutional:  Negative for fatigue and fever.  ?Respiratory:  Positive for cough and shortness of breath. Negative for choking.   ?Cardiovascular:  Positive for chest pain.  ? ?Physical  Exam ?Updated Vital Signs ?BP 123/72   Pulse 92   Temp 98.3 ?F (36.8 ?C) (Oral)   Resp 20   Ht '5\' 2"'$  (1.575 m)   Wt 77.1 kg   SpO2 94%   BMI 31.09 kg/m?  ?Physical Exam ?Vitals and nursing note reviewed.  ?HENT:  ?   Head: Atraumatic.  ?Cardiovascular:  ?   Rate and Rhythm: Normal rate. Rhythm irregular.  ?Pulmonary:  ?   Comments: Mildly harsh breath sounds without focal rales or rhonchi. ?Chest:  ?   Chest wall:  Tenderness present.  ?   Comments: Tenderness to anterior mid chest wall without crepitance or deformity. ?Abdominal:  ?   Tenderness: There is no abdominal tenderness.  ?Musculoskeletal:  ?   Cervical back: Neck supple.  ?   Right lower leg: Tenderness present. No edema.  ?   Left lower leg: Tenderness present. No edema.  ?Skin: ?   General: Skin is warm.  ?   Capillary Refill: Capillary refill takes less than 2 seconds.  ?Neurological:  ?   Mental Status: She is alert.  ?   Comments: Patient appears to be at her neurologic baseline.  ? ? ?ED Results / Procedures / Treatments   ?Labs ?(all labs ordered are listed, but only abnormal results are displayed) ?Labs Reviewed  ?BASIC METABOLIC PANEL - Abnormal; Notable for the following components:  ?    Result Value  ? Glucose, Bld 163 (*)   ? Creatinine, Ser 1.13 (*)   ? Calcium 8.8 (*)   ? GFR, Estimated 48 (*)   ? All other components within normal limits  ?CBC - Abnormal; Notable for the following components:  ? Platelets 141 (*)   ? All other components within normal limits  ?D-DIMER, QUANTITATIVE - Abnormal; Notable for the following components:  ? D-Dimer, Quant 1.24 (*)   ? All other components within normal limits  ?RESP PANEL BY RT-PCR (FLU A&B, COVID) ARPGX2  ?TROPONIN I (HIGH SENSITIVITY)  ?TROPONIN I (HIGH SENSITIVITY)  ? ? ?EKG ?EKG Interpretation ? ?Date/Time:  Thursday Sep 02 2021 13:42:51 EDT ?Ventricular Rate:  101 ?PR Interval:    ?QRS Duration: 62 ?QT Interval:  318 ?QTC Calculation: 412 ?R Axis:   23 ?Text Interpretation: Atrial  fibrillation with rapid ventricular response Low voltage QRS Cannot rule out Anterior infarct , age undetermined Abnormal ECG since last tracing no significant change Confirmed by Noemi Chapel [phone]) on 09/02/2021 3:07:

## 2021-09-07 ENCOUNTER — Telehealth: Payer: Self-pay | Admitting: *Deleted

## 2021-09-07 ENCOUNTER — Telehealth: Payer: 59

## 2021-09-07 ENCOUNTER — Ambulatory Visit: Payer: 59 | Admitting: Orthopedic Surgery

## 2021-09-07 NOTE — Telephone Encounter (Signed)
CSW spoke with pt's son, Audelia Acton, who reports he and  his brothers live with pt and provide care along with "[facility]" PCS services- pt gets 5 days per week of PCS care. Per son, they are able to provide transportation, meal assistance, etc and do not identify any SW needs. Care Guide to get pt/family scheduled with [facility] and CSW will await further need/orders. ? ?.Eduard Clos MSW, LCSW ?Licensed Clinical Social Worker ?[place]    ?[phone]  ?

## 2021-09-07 NOTE — Chronic Care Management (AMB) (Signed)
?  Chronic Care Management ?Note ? ?09/07/2021 ?Name: Flossie Wexler MRN: [id number] DOB: [birth year] ? ?Peja Allender is a 88 y.o. year old female who is a primary care patient of Dennard Schaumann, [name], MD and is actively engaged with the care management team. I reached out to [name] by phone today to assist with re-scheduling a follow up visit with the RN Case Manager ? ?Follow up plan: ?Unsuccessful telephone outreach attempt made. A HIPAA compliant phone message was left for the patient providing contact information and requesting a return call.  ?The care management team will reach out to the patient again over the next 7 days.  ?If patient returns call to provider office, please advise to call [place] at [phone]. ? ?Laverda Sorenson  ?Care Guide, Embedded Care Coordination ?[place]  Care Management  ?Direct Dial: [phone] ? ?

## 2021-09-14 ENCOUNTER — Ambulatory Visit: Payer: 59 | Admitting: Orthopedic Surgery

## 2021-09-14 NOTE — Chronic Care Management (AMB) (Signed)
?  Chronic Care Management ?Note ? ?09/14/2021 ?Name: [name] MRN: [id number] DOB: Jan 18, 1934 ? ?[name] is a 88 y.o. year old female who is a primary care patient of Dennard Schaumann, [name], MD and is actively engaged with the care management team. I reached out to [name] by phone today to assist with re-scheduling a follow up visit with the RN Case Manager ? ?Follow up plan: ?Unsuccessful telephone outreach attempt made. A HIPAA compliant phone message was left for the patient providing contact information and requesting a return call.  ?The care management team will reach out to the patient again over the next 7 days.  ?If patient returns call to provider office, please advise to call [place] at [phone]. ? ?Laverda Sorenson  ?Care Guide, Embedded Care Coordination ?[place]  Care Management  ?Direct Dial: [phone] ? ?

## 2021-09-14 NOTE — Chronic Care Management (AMB) (Signed)
?  Chronic Care Management ?Note ? ?09/14/2021 ?Name: Amerie Beaumont MRN: [id number] DOB: 04-Nov-1933 ? ?Neveen Daponte is a 88 y.o. year old female who is a primary care patient of Dennard Schaumann, [name], MD and is actively engaged with the care management team. I reached out to [name] by phone today to assist with re-scheduling a follow up visit with the RN Case Manager ? ?Follow up plan: ?Telephone appointment with care management team member scheduled for:10/11/21 ? ?Laverda Sorenson  ?Care Guide, Embedded Care Coordination ?[place]  Care Management  ?Direct Dial: [phone] ? ?

## 2021-09-17 ENCOUNTER — Ambulatory Visit (INDEPENDENT_AMBULATORY_CARE_PROVIDER_SITE_OTHER): Payer: 59

## 2021-09-17 ENCOUNTER — Ambulatory Visit (INDEPENDENT_AMBULATORY_CARE_PROVIDER_SITE_OTHER): Payer: 59 | Admitting: Orthopedic Surgery

## 2021-09-17 ENCOUNTER — Encounter: Payer: Self-pay | Admitting: Orthopedic Surgery

## 2021-09-17 DIAGNOSIS — G8929 Other chronic pain: Secondary | ICD-10-CM | POA: Diagnosis not present

## 2021-09-17 DIAGNOSIS — M25562 Pain in left knee: Secondary | ICD-10-CM | POA: Diagnosis not present

## 2021-09-17 DIAGNOSIS — S72002D Fracture of unspecified part of neck of left femur, subsequent encounter for closed fracture with routine healing: Secondary | ICD-10-CM | POA: Diagnosis not present

## 2021-09-17 NOTE — Progress Notes (Signed)
Orthopaedic Postop Note  Assessment: [name] is a 88 y.o. female s/p Left hip hemiarthroplasty  DOS: 08/27/2020  Plan: Left hip is healing well.  Radiographs are stable.  She is able to ambulate with some assistance, including a walker.  No pain with gentle range of motion of the left hip.  She does have chronic left knee pain.  Radiographs from several years ago demonstrates advanced degenerative changes.  She is interested in a steroid injection.  This was completed in clinic today.  Procedure note injection Left knee joint   Verbal consent was obtained to inject the left knee joint  Timeout was completed to confirm the site of injection.  The skin was prepped with alcohol and ethyl chloride was sprayed at the injection site.  A 21-gauge needle was used to inject 40 mg of Depo-Medrol and 1% lidocaine (3 cc) into the left knee using an anterolateral approach.  There were no complications. A sterile bandage was applied.   i   Follow-up: Return if symptoms worsen or fail to improve. XR at next visit: AP pelvis and Left hip  Subjective:  Chief Complaint  Patient presents with   Routine Post Op    s/p Left hip hemiarthroplasty  DOS: 08/27/2020     History of Present Illness: [name] is a 88 y.o. female who presents following the above stated procedure. She is approximately 1 year out from surgery, and doing well.  She has occasional pain in the left leg, which is primarily coming from her knee at this point.  She does ambulate within her house, with the assistance of a walker.  No issues with the surgical incision.  She is complaining of pain in the lateral aspect of the left knee.  She has previously had a right knee replacement, and was told she needed a replacement in her left knee.  However, she has not had this done.  She has had injections in the past and these were successful.  Review of Systems: No fevers or chills No numbness or tingling No Chest Pain No  shortness of breath   Objective: There were no vitals taken for this visit.  Physical Exam:  Alert and oriented.  No acute distress.   Seated in a wheelchair  No tenderness to palpation over the lateral hip.  Surgical incisions are well-healed.  No surrounding erythema or drainage.  No pain with axial loading.  She tolerates gentle range of motion of the left hip.  She does have tenderness to palpation of the lateral knee.  Tenderness to palpation along the lateral joint line.  Toes are warm and well-perfused.  IMAGING: I personally ordered and reviewed the following images:  XR of the left hip demonstrates a cemented left hip hemiarthroplasty.  Hip is reduced.  No acute injury.  No evidence of subsidence of the prosthesis.   Impression: Left hip hemiarthroplasty in good position.   Mordecai Rasmussen, MD 09/17/2021 10:29 PM

## 2021-09-17 NOTE — Patient Instructions (Signed)

## 2021-10-01 ENCOUNTER — Ambulatory Visit (INDEPENDENT_AMBULATORY_CARE_PROVIDER_SITE_OTHER): Payer: 59 | Admitting: Family Medicine

## 2021-10-01 ENCOUNTER — Encounter: Payer: Self-pay | Admitting: Family Medicine

## 2021-10-01 VITALS — BP 130/82 | HR 92 | Ht 62.0 in | Wt 170.0 lb

## 2021-10-01 DIAGNOSIS — I10 Essential (primary) hypertension: Secondary | ICD-10-CM

## 2021-10-01 DIAGNOSIS — Z8673 Personal history of transient ischemic attack (TIA), and cerebral infarction without residual deficits: Secondary | ICD-10-CM | POA: Diagnosis not present

## 2021-10-01 DIAGNOSIS — E782 Mixed hyperlipidemia: Secondary | ICD-10-CM

## 2021-10-01 MED ORDER — OXYCODONE HCL 5 MG PO TABS: 5.0000 mg | ORAL_TABLET | Freq: Four times a day (QID) | ORAL | 0 refills | Status: DC | PRN

## 2021-10-01 NOTE — Progress Notes (Signed)
Subjective:    Patient ID: [name], female    DOB: April 23, 1934, 88 y.o.   MRN: [id number]  HPI Patient is here today for follow-up of her chronic medical problems.  She has a history of chronic pain.  However, she is only using oxycodone 1 or 2 times a week.  Her son is monitoring her medication use and trying to keep this to a minimum.  Her dementia seems stable.  She denies any recent falls.  She denies any acute pain.  She denies any chest pain or shortness of breath or dyspnea on exertion although she is extremely sedentary.  She recently went to the emergency room due to chest pain where labs were normal except for elevated D-dimer.  CT angiogram of the chest revealed three-vessel coronary artery disease, a stable aortic aneurysm 3.6 cm, but was otherwise negative.  She denies any residual chest pain or shortness of breath or pleurisy.  Her blood pressure today is excellent at 130/82 Past Medical History:  Diagnosis Date   [name] (abdominal aortic aneurysm) ([place]) 09/2013   3.6 cm   Acute and chronic respiratory failure with hypoxia (HCC)    Acute delirium    Acute encephalopathy    Acute pyelonephritis    Atrial fibrillation (HCC)    Chronic pain syndrome    Constipation    Dementia (HCC)    Dependent edema    bilateral legs    Depression    Diabetes mellitus    pre-diabetes   Diverticulosis    Diverticulosis    Dysuria    E coli bacteremia    Fall    Gross hematuria    Hydronephrosis with renal and ureteral calculous obstruction    Hyperlipidemia    Hypertension    Hypoxemia    Idiopathic peripheral autonomic neuropathy    Infestation by bed bug    Nephrolithiasis    OSA on CPAP    Osteoarthritis    Osteopenia    T=  -2.1 in hip   Persistent mood (affective) disorder, unspecified (HCC)    Prediabetes    Rheumatoid arthritis (HCC)    Rheumatoid arthritis(714.0)    S/P carpal tunnel release    Sepsis secondary to UTI [facility])    Spinal stenosis    Unspecified  fracture of fifth lumbar vertebra, initial encounter for closed fracture ([place])    Unspecified fracture of t11-T12 vertebra, initial encounter for closed fracture ([place])    Urinary incontinence    Urinary tract infection    hx of    Urolithiasis    Past Surgical History:  Procedure Laterality Date   APPENDECTOMY     CARPAL TUNNEL RELEASE  12/27/2006   Right subcutaneous ulnar nerve transfer -- Right open carpal tunnel release   CHOLECYSTECTOMY     CYSTOSCOPY W/ URETERAL STENT PLACEMENT Right 01/30/2018   Procedure: CYSTOSCOPY WITH STENT REPLACEMENT retrograde pylegram;  Surgeon: Ardis Hughs, MD;  Location: [facility];  Service: Urology;  Laterality: Right;   CYSTOSCOPY WITH URETEROSCOPY AND STENT PLACEMENT Right 03/01/2018   Procedure: CYSTOSCPY/RIGHT URETEROSCOPY HOLMIUM LASER LITHOTRIPSY AND STENT EXCHANGE;  Surgeon: Ardis Hughs, MD;  Location: [facility];  Service: Urology;  Laterality: Right;   FOOT SURGERY     HIP ARTHROPLASTY Left 08/27/2020   Procedure: ARTHROPLASTY BIPOLAR HIP (HEMIARTHROPLASTY);  Surgeon: Mordecai Rasmussen, MD;  Location: [facility];  Service: Orthopedics;  Laterality: Left;   HOLMIUM LASER APPLICATION Right 09/73/5329   Procedure: HOLMIUM LASER APPLICATION;  Surgeon:  Ardis Hughs, MD;  Location: [facility];  Service: Urology;  Laterality: Right;   JOINT REPLACEMENT     BTKR, RTHR   TOTAL KNEE ARTHROPLASTY  08/21/2007   Right total knee replacement   Current Outpatient Medications on File Prior to Visit  Medication Sig Dispense Refill   acetaminophen (TYLENOL) 325 MG tablet Take 2 tablets (650 mg total) by mouth every 6 (six) hours.     albuterol (VENTOLIN HFA) 108 (90 Base) MCG/ACT inhaler Inhale 2 puffs into the lungs every 4 (four) hours as needed for wheezing or shortness of breath.     ammonium lactate (AMLACTIN) 12 % cream Apply topically as needed for dry skin. 385 g 0   aspirin EC 81 MG tablet Take 1 po BID x 6 weeks then resume 1 po daily 30 tablet  11   atenolol (TENORMIN) 25 MG tablet TAKE 1/2 TABLET BY MOUTH DAILY 15 tablet 10   Calcium Carbonate-Vitamin D3 600-400 MG-UNIT TABS Take 1 tablet by mouth daily.     diclofenac sodium (VOLTAREN) 1 % GEL Apply 2 g topically 4 (four) times daily. Rub into affected area of foot 2 to 4 times daily 100 g 2   DULoxetine (CYMBALTA) 30 MG capsule TAKE 1 CAPSULE BY MOUTH DAILY WITH 60 MG FOR TOTAL DAILY DOSE OF 90 MG 20 capsule 10   DULoxetine (CYMBALTA) 60 MG capsule TAKE 1 CAPSULE BY MOUTH DAILY WITH 30 MG FOR TOTAL DAILY DOSE OF 90 MG 20 capsule 10   gabapentin (NEURONTIN) 800 MG tablet TAKE 1 TABLET BY MOUTH 3 TIMES DAILY 90 tablet 10   ketoconazole (NIZORAL) 2 % cream Apply 1 application topically daily.     pantoprazole (PROTONIX) 40 MG tablet TAKE 1 TABLET BY MOUTH ONCE DAILY 30 tablet 10   polyethylene glycol (MIRALAX / GLYCOLAX) 17 g packet Take 17 g by mouth daily as needed for mild constipation or moderate constipation.     rosuvastatin (CRESTOR) 40 MG tablet TAKE 1 TABLET BY MOUTH EACH EVENING 90 tablet 1   mupirocin ointment (BACTROBAN) 2 % Apply 1 application topically 2 (two) times daily. (Patient not taking: Reported on 10/01/2021)     No current facility-administered medications on file prior to visit.   Allergies  Allergen Reactions   Sulfonamide Derivatives Hives and Rash   Social History   Socioeconomic History   Marital status: Divorced    Spouse name: Not on file   Number of children: 6   Years of education: 8th grade   Highest education level: Not on file  Occupational History   Occupation: Retired  Tobacco Use   Smoking status: Never   Smokeless tobacco: Never  Vaping Use   Vaping Use: Never used  Substance and Sexual Activity   Alcohol use: No   Drug use: No   Sexual activity: Not on file  Other Topics Concern   Not on file  Social History Narrative   Lives at home with 2 sons   She has four living children.   Right-handed.   Two cups caffeine per day.    Social Determinants of Health   Financial Resource Strain: Low Risk    Difficulty of Paying Living Expenses: Not hard at all  Food Insecurity: No Food Insecurity   Worried About Charity fundraiser in the Last Year: Never true   [place] in the Last Year: Never true  Transportation Needs: No Transportation Needs   Lack of Transportation (Medical): No  Lack of Transportation (Non-Medical): No  Physical Activity: Inactive   Days of Exercise per Week: 0 days   Minutes of Exercise per Session: 0 min  Stress: No Stress Concern Present   Feeling of Stress : Not at all  Social Connections: Socially Isolated   Frequency of Communication with Friends and Family: Once a week   Frequency of Social Gatherings with Friends and Family: More than three times a week   Attends Religious Services: Never   Marine scientist or Organizations: No   Attends Music therapist: Never   Marital Status: Divorced  Human resources officer Violence: Not At Risk   Fear of Current or Ex-Partner: No   Emotionally Abused: No   Physically Abused: No   Sexually Abused: No     Review of Systems  All other systems reviewed and are negative.     Objective:   Physical Exam Constitutional:      General: She is not in acute distress.    Appearance: Normal appearance. She is normal weight. She is not ill-appearing, toxic-appearing or diaphoretic.  Cardiovascular:     Rate and Rhythm: Normal rate. Rhythm irregular.     Heart sounds: Normal heart sounds. No murmur heard.   No gallop.  Pulmonary:     Effort: Pulmonary effort is normal. No tachypnea or respiratory distress.     Breath sounds: No wheezing or rhonchi.  Chest:     Chest wall: No tenderness or edema.  Abdominal:     General: There is no distension.     Tenderness: There is no abdominal tenderness. There is no guarding or rebound.  Musculoskeletal:     Right lower leg: No edema.     Left lower leg: No edema.  Neurological:      Mental Status: She is alert.          Assessment & Plan:  History of CVA (cerebrovascular accident) - Plan: Lipid panel  Mixed hyperlipidemia  Essential hypertension Patient recently had a CBC and a CMP checked at the hospital that were within normal limits except for some mild chronic kidney disease stage IIIa.  Blood pressure today is excellent.  I will check a fasting lipid panel.  Given her history of a CVA and her known coronary artery disease, I like her LDL cholesterol be below 70.  Otherwise I encouraged incentive spirometry as the patient does have bibasilar crackles concerning for atelectasis which I believe is due to shallow breathing.  I will refill her oxycodone which she uses sparingly for pain under the supervision of her son.  Otherwise she is doing well with no complaints.

## 2021-10-02 LAB — LIPID PANEL
Cholesterol: 241 mg/dL — ABNORMAL HIGH (ref <200)
HDL: 57 mg/dL (ref >=50)
LDL Cholesterol (Calc): 162 mg/dL (calc) — ABNORMAL HIGH
Non-HDL Cholesterol (Calc): 184 mg/dL (calc) — ABNORMAL HIGH (ref <130)
Total CHOL/HDL Ratio: 4.2 (calc) (ref <5.0)
Triglycerides: 108 mg/dL (ref <150)

## 2021-10-11 ENCOUNTER — Ambulatory Visit (INDEPENDENT_AMBULATORY_CARE_PROVIDER_SITE_OTHER): Payer: 59 | Admitting: *Deleted

## 2021-10-11 ENCOUNTER — Telehealth: Payer: Self-pay

## 2021-10-11 DIAGNOSIS — I10 Essential (primary) hypertension: Secondary | ICD-10-CM

## 2021-10-11 DIAGNOSIS — I482 Chronic atrial fibrillation, unspecified: Secondary | ICD-10-CM

## 2021-10-11 NOTE — Telephone Encounter (Signed)
I left a message for the patient to return my call.

## 2021-10-11 NOTE — Patient Instructions (Signed)
Visit Information  Thank you for taking time to visit with me today. Please don't hesitate to contact me if I can be of assistance to you before our next scheduled telephone appointment.  Following are the goals we discussed today:  Take medications as prescribed   Attend all scheduled provider appointments Call pharmacy for medication refills 3-7 days in advance of running out of medications Call provider office for new concerns or questions  check pulse (heart) rate once a day make a plan to eat healthy keep all lab appointments take medicine as prescribed check blood pressure 3 times per week choose a place to take my blood pressure (home, clinic or office, retail store) write blood pressure results in a log or diary learn about high blood pressure keep a blood pressure log take blood pressure log to all doctor appointments call doctor for signs and symptoms of high blood pressure take medications for blood pressure exactly as prescribed report new symptoms to your doctor eat more whole grains, fruits and vegetables, lean meats and healthy fats Continue using pulse oximeter to check heart rate and oxygen saturation Try to walk daily- even if only a few minutes  Our next appointment is by telephone on 12/24/21 at 1045 am  Please call the care guide team at [phone] if you need to cancel or reschedule your appointment.   If you are experiencing a Mental Health or [place] or need someone to talk to, please call the Suicide and Crisis Lifeline: 988 call the [place] National Suicide Prevention Lifeline: 515 395 6292 or TTY: 819 202 7827 TTY 505-624-2884) to talk to a trained counselor call 1-800-273-TALK (toll free, 24 hour hotline) go to [facility] [address] [place] [phone]) call the [place]: [phone] call 911   The patient verbalized understanding of instructions, educational  materials, and care plan provided today and DECLINED offer to receive copy of patient instructions, educational materials, and care plan.   Jacqlyn Larsen RNC, BSN RN Case Manager [facility] [phone]

## 2021-10-11 NOTE — Telephone Encounter (Signed)
-----   Message from [name], MD sent at 10/10/2021  3:55 PM EDT ----- Cholesterol is too high.  Verify if she is taking crestor daily.   1) If not, start crestor 40 mg poqd and recheck in 3 months OR 2) If she is add zetia 10 mg poqday and recheck in 3 months.

## 2021-10-11 NOTE — Chronic Care Management (AMB) (Signed)
Chronic Care Management   CCM RN Visit Note  10/11/2021 Name: [name] MRN: [id number] DOB: 1933-05-28  Subjective: [name] is a 88 y.o. year old female who is a primary care patient of Pickard, [name], MD. The care management team was consulted for assistance with disease management and care coordination needs.    Engaged with patient by telephone for follow up visit in response to provider referral for case management and/or care coordination services.   Consent to Services:  The patient was given information about Chronic Care Management services, agreed to services, and gave verbal consent prior to initiation of services.  Please see initial visit note for detailed documentation.   Patient agreed to services and verbal consent obtained.   Assessment: Review of patient past medical history, allergies, medications, health status, including review of consultants reports, laboratory and other test data, was performed as part of comprehensive evaluation and provision of chronic care management services.   SDOH (Social Determinants of Health) assessments and interventions performed:    CCM Care Plan  Allergies  Allergen Reactions   Sulfonamide Derivatives Hives and Rash    Outpatient Encounter Medications as of 10/11/2021  Medication Sig   acetaminophen (TYLENOL) 325 MG tablet Take 2 tablets (650 mg total) by mouth every 6 (six) hours.   albuterol (VENTOLIN HFA) 108 (90 Base) MCG/ACT inhaler Inhale 2 puffs into the lungs every 4 (four) hours as needed for wheezing or shortness of breath.   ammonium lactate (AMLACTIN) 12 % cream Apply topically as needed for dry skin.   aspirin EC 81 MG tablet Take 1 po BID x 6 weeks then resume 1 po daily   atenolol (TENORMIN) 25 MG tablet TAKE 1/2 TABLET BY MOUTH DAILY   Calcium Carbonate-Vitamin D3 600-400 MG-UNIT TABS Take 1 tablet by mouth daily.   diclofenac sodium (VOLTAREN) 1 % GEL Apply 2 g topically 4 (four) times daily. Rub  into affected area of foot 2 to 4 times daily   DULoxetine (CYMBALTA) 30 MG capsule TAKE 1 CAPSULE BY MOUTH DAILY WITH 60 MG FOR TOTAL DAILY DOSE OF 90 MG   DULoxetine (CYMBALTA) 60 MG capsule TAKE 1 CAPSULE BY MOUTH DAILY WITH 30 MG FOR TOTAL DAILY DOSE OF 90 MG   gabapentin (NEURONTIN) 800 MG tablet TAKE 1 TABLET BY MOUTH 3 TIMES DAILY   ketoconazole (NIZORAL) 2 % cream Apply 1 application topically daily.   mupirocin ointment (BACTROBAN) 2 % Apply 1 application  topically 2 (two) times daily.   oxyCODONE (OXY IR/ROXICODONE) 5 MG immediate release tablet Take 1 tablet (5 mg total) by mouth every 6 (six) hours as needed for moderate pain.   pantoprazole (PROTONIX) 40 MG tablet TAKE 1 TABLET BY MOUTH ONCE DAILY   polyethylene glycol (MIRALAX / GLYCOLAX) 17 g packet Take 17 g by mouth daily as needed for mild constipation or moderate constipation.   rosuvastatin (CRESTOR) 40 MG tablet TAKE 1 TABLET BY MOUTH EACH EVENING   No facility-administered encounter medications on file as of 10/11/2021.    Patient Active Problem List   Diagnosis Date Noted   Acute cystitis without hematuria    Stage 3a chronic kidney disease (CKD) ([place]) 02/13/2021   Dehydration 02/13/2021   Lactic acidosis 02/13/2021   Hypokalemia 00/92/3300   Metabolic alkalosis 76/22/6333   Acute metabolic encephalopathy 54/56/2563   Closed left hip fracture ([place]) 08/25/2020   Leukocytosis 08/25/2020   Hyperglycemia 08/25/2020   Elevated brain natriuretic peptide (BNP) level 08/25/2020  GERD (gastroesophageal reflux disease) 08/25/2020   Cerebrovascular accident (CVA) due to stenosis of basilar artery ([place]) 07/17/2019   Dyspnea 07/09/2019   Chronic diastolic CHF (congestive heart failure) ([place]) 07/09/2019   Chest pain    Coronary artery calcification seen on CT scan    Gait abnormality 01/24/2019   Tremor 01/24/2019   Memory loss 01/24/2019   Chronic bilateral low back pain without sciatica 06/06/2018   At risk for falls  06/06/2018   Senile osteoporosis 06/06/2018   Atelectasis of left lung 04/06/2018   Acute encephalopathy 02/09/2018   ESBL (extended spectrum beta-lactamase) producing bacteria infection 02/02/2018   Atrial fibrillation, chronic ([place]) 02/02/2018   Constipation 01/31/2018   Urolithiasis 01/30/2018   T12 compression fracture ([place]) 09/04/2017   Fall at home, initial encounter 07/03/2017   Tachycardia 02/03/2017   Chronic pain syndrome 02/02/2017   Dizziness 12/15/2016   Inflammation of sacroiliac joint ([place]) 05/07/2015   Lumbar spondylosis with myelopathy 02/11/2014   Peripheral polyneuropathy 02/11/2014   Opioid dependence ([place]) 02/11/2014   Abnormal EKG 11/21/2013   Displacement of lumbar intervertebral disc without myelopathy 11/18/2013   Lumbar radiculopathy 11/18/2013   [name] (abdominal aortic aneurysm) ([place]) 09/30/2013   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[place]) 01/03/2013   OSA (obstructive sleep apnea) 06/29/2012   Dementia ([place]) 06/29/2012   Pre-diabetes 06/29/2012   Hypoxia, sleep related 06/29/2012   RA (rheumatoid arthritis) ([place]) 06/29/2012   Nonspecific abnormal results of cardiovascular function study 06/02/2011   Nonspecific abnormal electrocardiogram (ECG) (EKG) 05/18/2011   Osteoporosis 04/09/2008   Hyperlipidemia 05/12/2006   DEPENDENT EDEMA, LEGS, BILATERAL 05/12/2006   PERIPHERAL NEUROPATHY 03/11/2006   Essential hypertension 03/11/2006    Conditions to be addressed/monitored:Atrial Fibrillation and HTN   Care Plan : RN Care Manager Plan of Care  Updates made by Kassie Mends, RN since 10/11/2021 12:00 AM     Problem: No plan of care established for management of chronic disease state  (Hypertension, Atrial Fibrillation)   Priority: High     Long-Range Goal: Development of plan of care for chronic disease management (HTN, Atrial Fibrillation)   Start Date: 10/11/2021  Expected End Date: 04/09/2022  Priority: High  Note:   Current Barriers:  Knowledge  Deficits related to plan of care for management of Atrial Fibrillation and HTN  Chronic Disease Management support and education needs related to Atrial Fibrillation and HTN Knowledge deficits related to self health management of chronic afib Knowledge Deficits related to atrial fibrillation action plan Knowledge Deficits related to basic understanding of hypertension pathophysiology and self care management- pt does not have blood pressure cuff, son states he will look in [employer] benefit catalog to obtain a cuff and start checking blood pressure. No Advanced Directives in place- patient's son reports they did complete advanced directives and had notarized Unable to self administer medications as prescribed- son provides oversight for medications Unable to perform ADLs independently- pt lives with son who is primary caregiver and there are 2 other sons in the home, pt is able to walk with assistance of walker, has wheelchair and ramp, had hip fracture in April 2023. Patient has PCS services M-F for 3 hours daily to assist with bathing and other ADL's. Unable to perform IADLs independently- son/ family assists  RNCM Clinical Goal(s):  Patient will verbalize understanding of plan for management of Atrial Fibrillation and HTN as evidenced by patient, caregiver report, review of EHR and  through collaboration with RN Care manager, provider, and care team.   Interventions:  1:1 collaboration with primary care provider regarding development and update of comprehensive plan of care as evidenced by provider attestation and co-signature Inter-disciplinary care team collaboration (see longitudinal plan of care) Evaluation of current treatment plan related to  self management and patient's adherence to plan as established by provider   AFIB Interventions: (Status:  Goal on track:  Yes.) Long Term Goal   Counseled on increased risk of stroke due to Afib and benefits of anticoagulation for stroke  prevention Reviewed importance of adherence to anticoagulant exactly as prescribed Counseled on importance of regular laboratory monitoring as prescribed Afib action plan reviewed Confirmed with patient's son that advanced directives completed  Hypertension Interventions:  (Status:  Goal on track:  Yes.) Long Term Goal Last practice recorded BP readings:  BP Readings from Last 3 Encounters:  10/01/21 130/82  09/02/21 123/72  07/13/21 115/63  Most recent eGFR/CrCl:  Lab Results  Component Value Date   EGFR 50 (L) 02/23/2021    No components found for: "CRCL"  Reviewed medications with patient and discussed importance of compliance Counseled on the importance of exercise goals with target of 150 minutes per week Advised patient, providing education and rationale, to monitor blood pressure daily and record, calling PCP for findings outside established parameters Discussed complications of poorly controlled blood pressure such as heart disease, stroke, circulatory complications, vision complications, kidney impairment, sexual dysfunction  Reviewed importance of following low sodium diet  Patient Goals/Self-Care Activities: Take medications as prescribed   Attend all scheduled provider appointments Call pharmacy for medication refills 3-7 days in advance of running out of medications Call provider office for new concerns or questions  - check pulse (heart) rate once a day - make a plan to eat healthy - keep all lab appointments - take medicine as prescribed check blood pressure 3 times per week choose a place to take my blood pressure (home, clinic or office, retail store) write blood pressure results in a log or diary learn about high blood pressure keep a blood pressure log take blood pressure log to all doctor appointments call doctor for signs and symptoms of high blood pressure take medications for blood pressure exactly as prescribed report new symptoms to your doctor eat  more whole grains, fruits and vegetables, lean meats and healthy fats Continue using pulse oximeter to check heart rate and oxygen saturation Try to walk daily- even if only a few minutes       Plan:Telephone follow up appointment with care management team member scheduled for:  12/24/21  Jacqlyn Larsen [facility], BSN RN Case Manager [facility] [phone]

## 2021-10-13 ENCOUNTER — Other Ambulatory Visit: Payer: Self-pay

## 2021-10-13 DIAGNOSIS — E782 Mixed hyperlipidemia: Secondary | ICD-10-CM

## 2021-10-13 MED ORDER — EZETIMIBE 10 MG PO TABS: 10.0000 mg | ORAL_TABLET | Freq: Every day | ORAL | 3 refills | Status: DC

## 2021-10-14 ENCOUNTER — Emergency Department (HOSPITAL_COMMUNITY)
Admission: EM | Admit: 2021-10-14 | Discharge: 2021-10-14 | Disposition: A | Discharge status: Home / Self Care | Payer: 59 | Source: Home / Self Care | Attending: Emergency Medicine | Admitting: Emergency Medicine

## 2021-10-14 ENCOUNTER — Emergency Department (HOSPITAL_COMMUNITY): Payer: 59

## 2021-10-14 ENCOUNTER — Encounter (HOSPITAL_COMMUNITY): Payer: Self-pay | Admitting: Emergency Medicine

## 2021-10-14 ENCOUNTER — Other Ambulatory Visit: Payer: Self-pay

## 2021-10-14 DIAGNOSIS — F0390 Unspecified dementia without behavioral disturbance: Secondary | ICD-10-CM | POA: Insufficient documentation

## 2021-10-14 DIAGNOSIS — N3000 Acute cystitis without hematuria: Secondary | ICD-10-CM | POA: Diagnosis not present

## 2021-10-14 DIAGNOSIS — Z7982 Long term (current) use of aspirin: Secondary | ICD-10-CM | POA: Insufficient documentation

## 2021-10-14 DIAGNOSIS — E860 Dehydration: Secondary | ICD-10-CM | POA: Diagnosis not present

## 2021-10-14 DIAGNOSIS — R531 Weakness: Secondary | ICD-10-CM

## 2021-10-14 LAB — URINALYSIS, ROUTINE W REFLEX MICROSCOPIC
Bilirubin Urine: NEGATIVE
Glucose, UA: NEGATIVE mg/dL
Hgb urine dipstick: NEGATIVE
Ketones, ur: NEGATIVE mg/dL
Leukocytes,Ua: NEGATIVE
Nitrite: NEGATIVE
Protein, ur: NEGATIVE mg/dL
Specific Gravity, Urine: 1.017 (ref 1.005–1.030)
pH: 7 (ref 5.0–8.0)

## 2021-10-14 LAB — COMPREHENSIVE METABOLIC PANEL
ALT: 12 U/L (ref 0–44)
AST: 18 U/L (ref 15–41)
Albumin: 3.6 g/dL (ref 3.5–5.0)
Alkaline Phosphatase: 63 U/L (ref 38–126)
Anion gap: 9 (ref 5–15)
BUN: 19 mg/dL (ref 8–23)
CO2: 29 mmol/L (ref 22–32)
Calcium: 8.9 mg/dL (ref 8.9–10.3)
Chloride: 96 mmol/L — ABNORMAL LOW (ref 98–111)
Creatinine, Ser: 1.05 mg/dL — ABNORMAL HIGH (ref 0.44–1.00)
GFR, Estimated: 52 mL/min — ABNORMAL LOW (ref >60)
Glucose, Bld: 107 mg/dL — ABNORMAL HIGH (ref 70–99)
Potassium: 4.4 mmol/L (ref 3.5–5.1)
Sodium: 134 mmol/L — ABNORMAL LOW (ref 135–145)
Total Bilirubin: 1.3 mg/dL — ABNORMAL HIGH (ref 0.3–1.2)
Total Protein: 7.1 g/dL (ref 6.5–8.1)

## 2021-10-14 LAB — CBC WITH DIFFERENTIAL/PLATELET
Abs Immature Granulocytes: 0.04 10*3/uL (ref 0.00–0.07)
Basophils Absolute: 0.1 10*3/uL (ref 0.0–0.1)
Basophils Relative: 1 %
Eosinophils Absolute: 0.1 10*3/uL (ref 0.0–0.5)
Eosinophils Relative: 1 %
HCT: 41 % (ref 36.0–46.0)
Hemoglobin: 13 g/dL (ref 12.0–15.0)
Immature Granulocytes: 0 %
Lymphocytes Relative: 15 %
Lymphs Abs: 1.5 10*3/uL (ref 0.7–4.0)
MCH: 29 pg (ref 26.0–34.0)
MCHC: 31.7 g/dL (ref 30.0–36.0)
MCV: 91.3 fL (ref 80.0–100.0)
Monocytes Absolute: 0.5 10*3/uL (ref 0.1–1.0)
Monocytes Relative: 5 %
Neutro Abs: 7.8 10*3/uL — ABNORMAL HIGH (ref 1.7–7.7)
Neutrophils Relative %: 78 %
Platelets: 158 10*3/uL (ref 150–400)
RBC: 4.49 MIL/uL (ref 3.87–5.11)
RDW: 13.4 % (ref 11.5–15.5)
WBC: 10 10*3/uL (ref 4.0–10.5)
nRBC: 0 % (ref 0.0–0.2)

## 2021-10-14 LAB — TROPONIN I (HIGH SENSITIVITY)
Troponin I (High Sensitivity): 6 ng/L (ref <18)
Troponin I (High Sensitivity): 6 ng/L (ref <18)

## 2021-10-14 NOTE — ED Notes (Signed)
Went over [employer] with daughter. All questions answered. Assisted to vehicle with family.

## 2021-10-14 NOTE — ED Triage Notes (Signed)
Pt to ER via EMS from home with reports of generalized weakness, unsure of onset.  Pt has hx of dementia and recurrent UTIs.  Pt is normally able to ambulate with a walker, but has been unable to ambulate "recently".  PT has not been eating today per family.  EMS CBG 91

## 2021-10-14 NOTE — ED Notes (Signed)
Pt to radiology for scans

## 2021-10-14 NOTE — Discharge Instructions (Signed)
Please follow up with your primary doctor's office for [name]'s worsening weakness.  We have consulted our case manager to contact you within the next few days about any additional possible assistance at home, in terms of a health aide.  However if [name] continues to decline in status, you may need to talk to her doctor about options for placement if he cannot take care of her at home.  The medical work-up today did not show signs of stroke, urine infection, heart attack, pneumonia, or other life-threatening conditions.  Please make sure she takes her evening medications when she gets home before the

## 2021-10-14 NOTE — ED Provider Notes (Signed)
[facility] EMERGENCY DEPARTMENT Provider Note   CSN: [id number] Arrival date & time: 10/14/21  1750     History  Chief Complaint  Patient presents with   Weakness    [name] is a 88 y.o. female with a history of dementia presenting from home with generalized weakness.  Patient is presenting by EMS.  She is normally able to walk with a walker but has not been able to recently.  Her family reported she not been eating much.  The patient herself appears fairly demented, cannot provide further history.  I spoke to [employer] her son by phone who reports she has been behaving strangely lately, has hx of confusion with UTI's.  Today she did not want to walk.  Her daughter at the bedside tells me the patient is some chronic borderline hypoxia and this is likely due to hypopnea and poor respiratory effort.  She has oxygen to wear "as needed at home".   Last UTI culture 07/13/21: Specimen Description IN/OUT CATH URINE  Performed at [facility] [address], [place], [place] [address]   Special Requests NONE  Performed at [facility], [address] [place], [place] [address]   Culture >=100,000 COLONIES/mL KLEBSIELLA PNEUMONIAE Abnormal    Report Status 07/15/2021 FINAL   Organism ID, Bacteria KLEBSIELLA PNEUMONIAE Abnormal    Resulting Agency [facility]     Susceptibility   Klebsiella pneumoniae    MIC    AMPICILLIN RESISTANT  Resistant    AMPICILLIN/SULBACTAM 4 SENSITIVE  Sensitive    CEFAZOLIN <=4 SENSITIVE  Sensitive    CEFEPIME <=0.12 SENS... Sensitive    CEFTRIAXONE <=0.25 SENS... Sensitive    CIPROFLOXACIN <=0.25 SENS... Sensitive    GENTAMICIN <=1 SENSITIVE  Sensitive    IMIPENEM <=0.25 SENS... Sensitive    NITROFURANTOIN 32 SENSITIVE  Sensitive    PIP/TAZO <=4 SENSITIVE  Sensitive    TRIMETH/SULFA <=20 SENSIT... Sensitive           HPI     Home Medications Prior to Admission medications   Medication Sig Start Date End Date Taking? Authorizing  Provider  acetaminophen (TYLENOL) 325 MG tablet Take 2 tablets (650 mg total) by mouth every 6 (six) hours. 09/01/20  Yes Johnson, Clanford L, MD  albuterol (VENTOLIN HFA) 108 (90 Base) MCG/ACT inhaler Inhale 2 puffs into the lungs every 4 (four) hours as needed for wheezing or shortness of breath. 09/02/20  Yes [provider]  ammonium lactate (AMLACTIN) 12 % cream Apply topically as needed for dry skin. 08/17/18  Yes Trula Slade, DPM  aspirin EC 81 MG tablet Take 1 po BID x 6 weeks then resume 1 po daily 09/01/20  Yes Johnson, Clanford L, MD  atenolol (TENORMIN) 25 MG tablet TAKE 1/2 TABLET BY MOUTH DAILY 06/28/21  Yes [name], MD  Calcium Carbonate-Vitamin D3 600-400 MG-UNIT TABS Take 1 tablet by mouth daily. 02/04/20  Yes [provider]  diclofenac sodium (VOLTAREN) 1 % GEL Apply 2 g topically 4 (four) times daily. Rub into affected area of foot 2 to 4 times daily 08/17/18  Yes Trula Slade, DPM  DULoxetine (CYMBALTA) 30 MG capsule TAKE 1 CAPSULE BY MOUTH DAILY WITH 60 MG FOR TOTAL DAILY DOSE OF 90 MG Patient taking differently: Take 30 mg by mouth See admin instructions. Take 1 capsule by mouth daily with 60 mg for total daily dose of 90 mg 04/02/21  Yes [name], MD  DULoxetine (CYMBALTA) 60 MG  capsule TAKE 1 CAPSULE BY MOUTH DAILY WITH 30 MG FOR TOTAL DAILY DOSE OF 90 MG Patient taking differently: Take 60 mg by mouth See admin instructions. Take 1 capsule by mouth with 30 mg for a total dose of 90 mg 04/02/21  Yes [name], MD  gabapentin (NEURONTIN) 800 MG tablet TAKE 1 TABLET BY MOUTH 3 TIMES DAILY 01/13/21  Yes [name], MD  ketoconazole (NIZORAL) 2 % cream Apply 1 application topically daily. 02/25/21  Yes [provider]  mupirocin ointment (BACTROBAN) 2 % Apply 1 application  topically 2 (two) times daily. 02/25/21  Yes [provider]  oxyCODONE (OXY IR/ROXICODONE) 5 MG immediate release tablet Take 1 tablet (5  mg total) by mouth every 6 (six) hours as needed for moderate pain. 10/01/21  Yes [name], MD  pantoprazole (PROTONIX) 40 MG tablet TAKE 1 TABLET BY MOUTH ONCE DAILY 12/10/20  Yes [name], MD  polyethylene glycol (MIRALAX / GLYCOLAX) 17 g packet Take 17 g by mouth daily as needed for mild constipation or moderate constipation.   Yes [provider]  rosuvastatin (CRESTOR) 40 MG tablet TAKE 1 TABLET BY MOUTH EACH EVENING Patient taking differently: Take 40 mg by mouth daily. 05/31/21  Yes [name], MD  ezetimibe (ZETIA) 10 MG tablet Take 1 tablet (10 mg total) by mouth daily. 10/13/21   [name], MD      Allergies    Sulfonamide derivatives    Review of Systems   Review of Systems  Physical Exam Updated Vital Signs BP 135/88   Pulse 99   Temp 98.5 F (36.9 C)   Resp 16   Ht '5\' 2"'$  (1.575 m)   Wt 77 kg   SpO2 91%   BMI 31.05 kg/m  Physical Exam Constitutional:      General: She is not in acute distress.    Comments: Appears pleasantly demented  HENT:     Head: Normocephalic and atraumatic.  Eyes:     Conjunctiva/sclera: Conjunctivae normal.     Pupils: Pupils are equal, round, and reactive to light.  Cardiovascular:     Rate and Rhythm: Normal rate and regular rhythm.  Pulmonary:     Effort: Pulmonary effort is normal. No respiratory distress.  Abdominal:     General: There is no distension.     Tenderness: There is no abdominal tenderness.  Skin:    General: Skin is warm and dry.  Neurological:     General: No focal deficit present.     Mental Status: She is alert. Mental status is at baseline.     ED Results / Procedures / Treatments   Labs (all labs ordered are listed, but only abnormal results are displayed) Labs Reviewed  COMPREHENSIVE METABOLIC PANEL - Abnormal; Notable for the following components:      Result Value   Sodium 134 (*)    Chloride 96 (*)    Glucose, Bld 107 (*)    Creatinine, Ser 1.05 (*)    Total  Bilirubin 1.3 (*)    GFR, Estimated 52 (*)    All other components within normal limits  CBC WITH DIFFERENTIAL/PLATELET - Abnormal; Notable for the following components:   Neutro Abs 7.8 (*)    All other components within normal limits  URINALYSIS, ROUTINE W REFLEX MICROSCOPIC - Abnormal; Notable for the following components:   Color, Urine AMBER (*)    APPearance CLOUDY (*)    All other components within  normal limits  TROPONIN I (HIGH SENSITIVITY)  TROPONIN I (HIGH SENSITIVITY)    EKG None  Radiology DG Chest 2 View  Result Date: 10/14/2021 CLINICAL DATA:  hypoxia, evaluate for PNA EXAM: CHEST - 2 VIEW COMPARISON:  09/02/2021 FINDINGS: Cardiomegaly. Tortuous, calcified aorta. No overt edema. No confluent opacities or effusions. No acute bony abnormality. IMPRESSION: Cardiomegaly.  No active disease. Electronically Signed   By: Rolm Baptise M.D.   On: 10/14/2021 19:19   CT Head Wo Contrast  Result Date: 10/14/2021 CLINICAL DATA:  Mental status change, unknown cause EXAM: CT HEAD WITHOUT CONTRAST TECHNIQUE: Contiguous axial images were obtained from the base of the skull through the vertex without intravenous contrast. RADIATION DOSE REDUCTION: This exam was performed according to the departmental dose-optimization program which includes automated exposure control, adjustment of the mA and/or kV according to patient size and/or use of iterative reconstruction technique. COMPARISON:  03/12/2021 FINDINGS: Brain: There is atrophy and chronic small vessel disease changes. No acute intracranial abnormality. Specifically, no hemorrhage, hydrocephalus, mass lesion, acute infarction, or significant intracranial injury. Vascular: No hyperdense vessel or unexpected calcification. Skull: No acute calvarial abnormality. Sinuses/Orbits: No acute findings Other: None IMPRESSION: Atrophy, chronic microvascular disease. No acute intracranial abnormality. Electronically Signed   By: Rolm Baptise M.D.   On:  10/14/2021 19:18    Procedures Procedures    Medications Ordered in ED Medications - No data to display  ED Course/ Medical Decision Making/ A&P                           Medical Decision Making Amount and/or Complexity of Data Reviewed Labs: ordered. Radiology: ordered. ECG/medicine tests: ordered.   This patient presents to the Emergency Department with complaint of altered mental status.  This involves an extensive number of treatment options, and is a complaint that carries with it a high risk of complications and morbidity.  The differential diagnosis includes hypoglycemia vs metabolic encephalopathy vs infection (including cystitis) vs ICH vs stroke vs polypharmacy vs other  I ordered, reviewed, and interpreted labs, including no evidence of UTI, troponin, electrolytes, hemoglobin, white blood cell count largely unremarkable. I ordered imaging studies which included CT scan of the head, x-ray of the chest I independently visualized and interpreted imaging which showed no evidence of pneumonia, no focal CVA and the monitor tracing which showed atrial fibrillation, chronic Additional history was obtained from patient's son by phone, EMS Previous records obtained and reviewed showing most recent positive urine culture result from March of this year as listed above  After the interventions stated above, I reevaluated the patient and found she remained clinically stable, in discussion with her son and her daughter at bedside, that she is reasonably stable for discharge home.  Social work was consulted to see if they could assist the family with additional home health         Final Clinical Impression(s) / ED Diagnoses Final diagnoses:  Weakness    Rx / DC Orders ED Discharge Orders     None         Aunesti Pellegrino, Carola Rhine, MD 10/14/21 2320

## 2021-10-15 ENCOUNTER — Other Ambulatory Visit: Payer: Self-pay

## 2021-10-15 ENCOUNTER — Encounter (HOSPITAL_COMMUNITY): Payer: Self-pay

## 2021-10-15 ENCOUNTER — Inpatient Hospital Stay (HOSPITAL_COMMUNITY)
Admission: EM | Admit: 2021-10-15 | Discharge: 2021-10-20 | DRG: 689 | Disposition: A | Discharge status: Skilled Nursing Facility | Payer: 59 | Attending: Internal Medicine | Admitting: Internal Medicine

## 2021-10-15 DIAGNOSIS — Z1611 Resistance to penicillins: Secondary | ICD-10-CM | POA: Diagnosis present

## 2021-10-15 DIAGNOSIS — E860 Dehydration: Secondary | ICD-10-CM | POA: Diagnosis present

## 2021-10-15 DIAGNOSIS — N1831 Chronic kidney disease, stage 3a: Secondary | ICD-10-CM | POA: Diagnosis present

## 2021-10-15 DIAGNOSIS — B961 Klebsiella pneumoniae [K. pneumoniae] as the cause of diseases classified elsewhere: Secondary | ICD-10-CM | POA: Diagnosis present

## 2021-10-15 DIAGNOSIS — E785 Hyperlipidemia, unspecified: Secondary | ICD-10-CM | POA: Diagnosis present

## 2021-10-15 DIAGNOSIS — Z96642 Presence of left artificial hip joint: Secondary | ICD-10-CM | POA: Diagnosis present

## 2021-10-15 DIAGNOSIS — I5032 Chronic diastolic (congestive) heart failure: Secondary | ICD-10-CM | POA: Diagnosis present

## 2021-10-15 DIAGNOSIS — G894 Chronic pain syndrome: Secondary | ICD-10-CM | POA: Diagnosis present

## 2021-10-15 DIAGNOSIS — G4733 Obstructive sleep apnea (adult) (pediatric): Secondary | ICD-10-CM | POA: Diagnosis present

## 2021-10-15 DIAGNOSIS — I714 Abdominal aortic aneurysm, without rupture, unspecified: Secondary | ICD-10-CM | POA: Diagnosis present

## 2021-10-15 DIAGNOSIS — F0390 Unspecified dementia without behavioral disturbance: Secondary | ICD-10-CM | POA: Diagnosis present

## 2021-10-15 DIAGNOSIS — I482 Chronic atrial fibrillation, unspecified: Secondary | ICD-10-CM | POA: Diagnosis present

## 2021-10-15 DIAGNOSIS — F03A0 Unspecified dementia, mild, without behavioral disturbance, psychotic disturbance, mood disturbance, and anxiety: Secondary | ICD-10-CM | POA: Diagnosis present

## 2021-10-15 DIAGNOSIS — D72829 Elevated white blood cell count, unspecified: Secondary | ICD-10-CM | POA: Diagnosis present

## 2021-10-15 DIAGNOSIS — N3000 Acute cystitis without hematuria: Principal | ICD-10-CM | POA: Diagnosis present

## 2021-10-15 DIAGNOSIS — Z87440 Personal history of urinary (tract) infections: Secondary | ICD-10-CM

## 2021-10-15 DIAGNOSIS — Z96651 Presence of right artificial knee joint: Secondary | ICD-10-CM | POA: Diagnosis present

## 2021-10-15 DIAGNOSIS — R7303 Prediabetes: Secondary | ICD-10-CM | POA: Diagnosis present

## 2021-10-15 DIAGNOSIS — M069 Rheumatoid arthritis, unspecified: Secondary | ICD-10-CM | POA: Diagnosis present

## 2021-10-15 DIAGNOSIS — R262 Difficulty in walking, not elsewhere classified: Secondary | ICD-10-CM | POA: Diagnosis present

## 2021-10-15 DIAGNOSIS — E876 Hypokalemia: Secondary | ICD-10-CM | POA: Diagnosis not present

## 2021-10-15 DIAGNOSIS — G9341 Metabolic encephalopathy: Secondary | ICD-10-CM | POA: Diagnosis present

## 2021-10-15 DIAGNOSIS — R8271 Bacteriuria: Secondary | ICD-10-CM

## 2021-10-15 DIAGNOSIS — F32A Depression, unspecified: Secondary | ICD-10-CM | POA: Diagnosis present

## 2021-10-15 DIAGNOSIS — Z823 Family history of stroke: Secondary | ICD-10-CM

## 2021-10-15 DIAGNOSIS — Z6831 Body mass index (BMI) 31.0-31.9, adult: Secondary | ICD-10-CM | POA: Diagnosis not present

## 2021-10-15 DIAGNOSIS — N390 Urinary tract infection, site not specified: Secondary | ICD-10-CM | POA: Diagnosis present

## 2021-10-15 DIAGNOSIS — E669 Obesity, unspecified: Secondary | ICD-10-CM | POA: Diagnosis present

## 2021-10-15 DIAGNOSIS — K219 Gastro-esophageal reflux disease without esophagitis: Secondary | ICD-10-CM | POA: Diagnosis present

## 2021-10-15 DIAGNOSIS — Z833 Family history of diabetes mellitus: Secondary | ICD-10-CM

## 2021-10-15 DIAGNOSIS — Z882 Allergy status to sulfonamides status: Secondary | ICD-10-CM

## 2021-10-15 DIAGNOSIS — Z79891 Long term (current) use of opiate analgesic: Secondary | ICD-10-CM

## 2021-10-15 DIAGNOSIS — R630 Anorexia: Secondary | ICD-10-CM | POA: Diagnosis present

## 2021-10-15 DIAGNOSIS — I509 Heart failure, unspecified: Secondary | ICD-10-CM

## 2021-10-15 DIAGNOSIS — I130 Hypertensive heart and chronic kidney disease with heart failure and stage 1 through stage 4 chronic kidney disease, or unspecified chronic kidney disease: Secondary | ICD-10-CM | POA: Diagnosis present

## 2021-10-15 DIAGNOSIS — Z7982 Long term (current) use of aspirin: Secondary | ICD-10-CM

## 2021-10-15 DIAGNOSIS — R531 Weakness: Secondary | ICD-10-CM

## 2021-10-15 DIAGNOSIS — I10 Essential (primary) hypertension: Secondary | ICD-10-CM

## 2021-10-15 DIAGNOSIS — E66811 Obesity, class 1: Secondary | ICD-10-CM | POA: Diagnosis present

## 2021-10-15 DIAGNOSIS — Z8249 Family history of ischemic heart disease and other diseases of the circulatory system: Secondary | ICD-10-CM

## 2021-10-15 DIAGNOSIS — R4182 Altered mental status, unspecified: Principal | ICD-10-CM

## 2021-10-15 LAB — CBC WITH DIFFERENTIAL/PLATELET
Abs Immature Granulocytes: 0.32 10*3/uL — ABNORMAL HIGH (ref 0.00–0.07)
Basophils Absolute: 0.1 10*3/uL (ref 0.0–0.1)
Basophils Relative: 0 %
Eosinophils Absolute: 0 10*3/uL (ref 0.0–0.5)
Eosinophils Relative: 0 %
HCT: 37.2 % (ref 36.0–46.0)
Hemoglobin: 11.8 g/dL — ABNORMAL LOW (ref 12.0–15.0)
Immature Granulocytes: 2 %
Lymphocytes Relative: 5 %
Lymphs Abs: 0.7 10*3/uL (ref 0.7–4.0)
MCH: 28.6 pg (ref 26.0–34.0)
MCHC: 31.7 g/dL (ref 30.0–36.0)
MCV: 90.3 fL (ref 80.0–100.0)
Monocytes Absolute: 0.8 10*3/uL (ref 0.1–1.0)
Monocytes Relative: 5 %
Neutro Abs: 14.1 10*3/uL — ABNORMAL HIGH (ref 1.7–7.7)
Neutrophils Relative %: 88 %
Platelets: 160 10*3/uL (ref 150–400)
RBC: 4.12 MIL/uL (ref 3.87–5.11)
RDW: 13.3 % (ref 11.5–15.5)
WBC: 15.9 10*3/uL — ABNORMAL HIGH (ref 4.0–10.5)
nRBC: 0 % (ref 0.0–0.2)

## 2021-10-15 LAB — COMPREHENSIVE METABOLIC PANEL
ALT: 13 U/L (ref 0–44)
AST: 19 U/L (ref 15–41)
Albumin: 3.2 g/dL — ABNORMAL LOW (ref 3.5–5.0)
Alkaline Phosphatase: 55 U/L (ref 38–126)
Anion gap: 10 (ref 5–15)
BUN: 21 mg/dL (ref 8–23)
CO2: 27 mmol/L (ref 22–32)
Calcium: 8.7 mg/dL — ABNORMAL LOW (ref 8.9–10.3)
Chloride: 95 mmol/L — ABNORMAL LOW (ref 98–111)
Creatinine, Ser: 1.04 mg/dL — ABNORMAL HIGH (ref 0.44–1.00)
GFR, Estimated: 53 mL/min — ABNORMAL LOW (ref >60)
Glucose, Bld: 133 mg/dL — ABNORMAL HIGH (ref 70–99)
Potassium: 3.9 mmol/L (ref 3.5–5.1)
Sodium: 132 mmol/L — ABNORMAL LOW (ref 135–145)
Total Bilirubin: 1.6 mg/dL — ABNORMAL HIGH (ref 0.3–1.2)
Total Protein: 6.7 g/dL (ref 6.5–8.1)

## 2021-10-15 LAB — URINALYSIS, ROUTINE W REFLEX MICROSCOPIC
Bilirubin Urine: NEGATIVE
Glucose, UA: NEGATIVE mg/dL
Hgb urine dipstick: NEGATIVE
Ketones, ur: 5 mg/dL — AB
Leukocytes,Ua: NEGATIVE
Nitrite: NEGATIVE
Protein, ur: 30 mg/dL — AB
Specific Gravity, Urine: 1.026 (ref 1.005–1.030)
pH: 5 (ref 5.0–8.0)

## 2021-10-15 LAB — BLOOD GAS, ARTERIAL
Acid-Base Excess: 5.1 mmol/L — ABNORMAL HIGH (ref 0.0–2.0)
Bicarbonate: 27.8 mmol/L (ref 20.0–28.0)
Drawn by: 22223
FIO2: 32 %
O2 Saturation: 100 %
Patient temperature: 37
pCO2 arterial: 34 mmHg (ref 32–48)
pH, Arterial: 7.52 — ABNORMAL HIGH (ref 7.35–7.45)
pO2, Arterial: 157 mmHg — ABNORMAL HIGH (ref 83–108)

## 2021-10-15 LAB — TSH: TSH: 1.452 u[IU]/mL (ref 0.350–4.500)

## 2021-10-15 MED ORDER — POLYETHYLENE GLYCOL 3350 17 G PO PACK: 17.0000 g | PACK | Freq: Every day | ORAL | Status: DC | PRN

## 2021-10-15 MED ORDER — ONDANSETRON HCL 4 MG/2ML IJ SOLN: 4.0000 mg | Freq: Four times a day (QID) | INTRAMUSCULAR | Status: DC | PRN

## 2021-10-15 MED ORDER — SODIUM CHLORIDE 0.9 % IV SOLN
1.0000 g | INTRAVENOUS | Status: DC
  Administered 2021-10-16 – 2021-10-18 (×3): 1 g via INTRAVENOUS
  Filled 2021-10-15 (×3): qty 10

## 2021-10-15 MED ORDER — ASPIRIN 81 MG PO TBEC
81.0000 mg | DELAYED_RELEASE_TABLET | Freq: Every day | ORAL | Status: DC
  Administered 2021-10-15 – 2021-10-20 (×6): 81 mg via ORAL
  Filled 2021-10-15 (×6): qty 1

## 2021-10-15 MED ORDER — ONDANSETRON HCL 4 MG PO TABS: 4.0000 mg | ORAL_TABLET | Freq: Four times a day (QID) | ORAL | Status: DC | PRN

## 2021-10-15 MED ORDER — ENOXAPARIN SODIUM 40 MG/0.4ML IJ SOSY
40.0000 mg | PREFILLED_SYRINGE | INTRAMUSCULAR | Status: DC
  Administered 2021-10-15: 40 mg via SUBCUTANEOUS
  Filled 2021-10-15 (×2): qty 0.4

## 2021-10-15 MED ORDER — ATENOLOL 25 MG PO TABS
12.5000 mg | ORAL_TABLET | Freq: Every day | ORAL | Status: DC
  Administered 2021-10-15 – 2021-10-20 (×6): 12.5 mg via ORAL
  Filled 2021-10-15 (×6): qty 1

## 2021-10-15 MED ORDER — OYSTER SHELL CALCIUM/D3 500-5 MG-MCG PO TABS
1.0000 | ORAL_TABLET | Freq: Every day | ORAL | Status: DC
  Administered 2021-10-15 – 2021-10-20 (×5): 1 via ORAL
  Filled 2021-10-15 (×6): qty 1

## 2021-10-15 MED ORDER — ACETAMINOPHEN 325 MG PO TABS
650.0000 mg | ORAL_TABLET | Freq: Four times a day (QID) | ORAL | Status: DC
  Administered 2021-10-15 – 2021-10-20 (×20): 650 mg via ORAL
  Filled 2021-10-15 (×21): qty 2

## 2021-10-15 MED ORDER — ROSUVASTATIN CALCIUM 20 MG PO TABS
40.0000 mg | ORAL_TABLET | Freq: Every day | ORAL | Status: DC
  Administered 2021-10-15 – 2021-10-20 (×6): 40 mg via ORAL
  Filled 2021-10-15 (×6): qty 2

## 2021-10-15 MED ORDER — LACTATED RINGERS IV BOLUS
500.0000 mL | Freq: Once | INTRAVENOUS | Status: AC
  Administered 2021-10-15: 500 mL via INTRAVENOUS

## 2021-10-15 MED ORDER — ACETAMINOPHEN 650 MG RE SUPP: 650.0000 mg | Freq: Four times a day (QID) | RECTAL | Status: DC | PRN

## 2021-10-15 MED ORDER — KETOCONAZOLE 2 % EX CREA
1.0000 | TOPICAL_CREAM | Freq: Every day | CUTANEOUS | Status: DC
  Administered 2021-10-16 – 2021-10-20 (×4): 1 via TOPICAL
  Filled 2021-10-15: qty 15

## 2021-10-15 MED ORDER — PANTOPRAZOLE SODIUM 40 MG PO TBEC
40.0000 mg | DELAYED_RELEASE_TABLET | Freq: Every day | ORAL | Status: DC
  Administered 2021-10-15 – 2021-10-20 (×6): 40 mg via ORAL
  Filled 2021-10-15 (×6): qty 1

## 2021-10-15 MED ORDER — SODIUM CHLORIDE 0.9 % IV SOLN
1.0000 g | Freq: Once | INTRAVENOUS | Status: AC
  Administered 2021-10-15: 1 g via INTRAVENOUS
  Filled 2021-10-15: qty 10

## 2021-10-15 MED ORDER — DICLOFENAC SODIUM 1 % TD GEL
2.0000 g | Freq: Four times a day (QID) | TRANSDERMAL | Status: DC
Start: 2021-10-15 — End: 2021-10-20
  Administered 2021-10-15 – 2021-10-20 (×17): 2 g via TOPICAL
  Filled 2021-10-15: qty 100
  Filled 2021-10-15: qty 1
  Filled 2021-10-15: qty 100

## 2021-10-15 MED ORDER — ACETAMINOPHEN 325 MG PO TABS: 650.0000 mg | ORAL_TABLET | Freq: Four times a day (QID) | ORAL | Status: DC | PRN

## 2021-10-15 MED ORDER — ALBUTEROL SULFATE HFA 108 (90 BASE) MCG/ACT IN AERS: 2.0000 | INHALATION_SPRAY | RESPIRATORY_TRACT | Status: DC | PRN

## 2021-10-15 MED ORDER — LACTATED RINGERS IV SOLN
INTRAVENOUS | Status: AC
  Administered 2021-10-15: 75 mL/h via INTRAVENOUS

## 2021-10-15 MED ORDER — NALOXONE HCL 0.4 MG/ML IJ SOLN
0.4000 mg | Freq: Once | INTRAMUSCULAR | Status: AC
Start: 2021-10-15 — End: 2021-10-15
  Administered 2021-10-15: 0.4 mg via INTRAVENOUS
  Filled 2021-10-15: qty 1

## 2021-10-15 MED ORDER — EZETIMIBE 10 MG PO TABS
10.0000 mg | ORAL_TABLET | Freq: Every day | ORAL | Status: DC
  Administered 2021-10-15 – 2021-10-20 (×6): 10 mg via ORAL
  Filled 2021-10-15 (×6): qty 1

## 2021-10-15 MED ORDER — LACTATED RINGERS IV BOLUS
1000.0000 mL | Freq: Once | INTRAVENOUS | Status: AC
  Administered 2021-10-15: 1000 mL via INTRAVENOUS

## 2021-10-15 NOTE — ED Notes (Signed)
Report given to 300 RN. 

## 2021-10-15 NOTE — Progress Notes (Signed)
   10/15/21 1755  Assess: MEWS Score  Temp 98.3 F (36.8 C)  BP (!) 88/59  MAP (mmHg) 70  Pulse Rate (!) 104  Resp 17  SpO2 95 %  O2 Device Room Air  Assess: MEWS Score  MEWS Temp 0  MEWS Systolic 1  MEWS Pulse 1  MEWS RR 0  MEWS LOC 0  MEWS Score 2  MEWS Score Color Yellow  Assess: if the MEWS score is Yellow or Red  Were vital signs taken at a resting state? Yes  Focused Assessment Change from prior assessment (see assessment flowsheet)  Does the patient meet 2 or more of the SIRS criteria? No  MEWS guidelines implemented *See Row Information* Yes  Treat  Pain Scale 0-10  Pain Score 0  Breathing 0  Negative Vocalization 0  Facial Expression 0  Body Language 0  Consolability 0  PAINAD Score 0  Take Vital Signs  Increase Vital Sign Frequency  Yellow: Q 2hr X 2 then Q 4hr X 2, if remains yellow, continue Q 4hrs  Escalate  MEWS: Escalate Yellow: discuss with charge nurse/RN and consider discussing with provider and RRT  Notify: Charge Nurse/RN  Name of Charge Nurse/RN Notified Victorino Dike, RN  Date Charge Nurse/RN Notified 10/15/21  Time Charge Nurse/RN Notified 1837  Notify: Provider  Provider Name/Title Dr. Manuella Ghazi  Date Provider Notified 10/15/21  Time Provider Notified 1837  Method of Notification Page  Notification Reason Change in status  Provider response No new orders  Date of Provider Response 10/15/21  Time of Provider Response 1837  Document  Patient Outcome Stabilized after interventions  Progress note created (see row info) Yes  Assess: SIRS CRITERIA  SIRS Temperature  0  SIRS Pulse 1  SIRS Respirations  0  SIRS WBC 0  SIRS Score Sum  1

## 2021-10-15 NOTE — Progress Notes (Signed)
RT obtaine ABG at 0515 and dropped off in lab at 0519.

## 2021-10-15 NOTE — ED Notes (Signed)
PO meds given, told pt her breakfast tray was at bedside, pt stated she did not feel hungry at this time

## 2021-10-15 NOTE — ED Provider Notes (Signed)
[facility] EMERGENCY DEPARTMENT  Provider Note  CSN: [id number] Arrival date & time: 10/15/21 0355  History Chief Complaint  Patient presents with   Altered Mental Status    [name] is a 88 y.o. female brought to the ED via EMS from home. Son at bedside provides much of the history. She was in the ED several hours ago for general weakness and confusion. She had an essentially negative workup and was ultimately discharged home with plans for Social Work to call to arrange home health services. Son reports she has continued to be off her baseline which is alert but confused and able to ambulate with a walker. She has not been able to walk or get out of bed since yesterday morning. She called her son to go to the rest room during the night and they were unable to get her out of bed. She has a history of chronic pain, on Oxycodone which she as using sparingly, 1-2 times per week previously. That Rx was refilled earlier in the week and son reports she has been taking it more often since then for her pain. She has had poor PO intake during the last 2 days. No reported fever, vomiting, diarrhea or falls.    Home Medications Prior to Admission medications   Medication Sig Start Date End Date Taking? Authorizing Provider  acetaminophen (TYLENOL) 325 MG tablet Take 2 tablets (650 mg total) by mouth every 6 (six) hours. 09/01/20   Johnson, Clanford L, MD  albuterol (VENTOLIN HFA) 108 (90 Base) MCG/ACT inhaler Inhale 2 puffs into the lungs every 4 (four) hours as needed for wheezing or shortness of breath. 09/02/20   [provider]  ammonium lactate (AMLACTIN) 12 % cream Apply topically as needed for dry skin. 08/17/18   Trula Slade, DPM  aspirin EC 81 MG tablet Take 1 po BID x 6 weeks then resume 1 po daily 09/01/20   Irwin Brakeman L, MD  atenolol (TENORMIN) 25 MG tablet TAKE 1/2 TABLET BY MOUTH DAILY 06/28/21   [name], MD  Calcium Carbonate-Vitamin D3 600-400 MG-UNIT  TABS Take 1 tablet by mouth daily. 02/04/20   [provider]  diclofenac sodium (VOLTAREN) 1 % GEL Apply 2 g topically 4 (four) times daily. Rub into affected area of foot 2 to 4 times daily 08/17/18   Trula Slade, DPM  DULoxetine (CYMBALTA) 30 MG capsule TAKE 1 CAPSULE BY MOUTH DAILY WITH 60 MG FOR TOTAL DAILY DOSE OF 90 MG Patient taking differently: Take 30 mg by mouth See admin instructions. Take 1 capsule by mouth daily with 60 mg for total daily dose of 90 mg 04/02/21   [name], MD  DULoxetine (CYMBALTA) 60 MG capsule TAKE 1 CAPSULE BY MOUTH DAILY WITH 30 MG FOR TOTAL DAILY DOSE OF 90 MG Patient taking differently: Take 60 mg by mouth See admin instructions. Take 1 capsule by mouth with 30 mg for a total dose of 90 mg 04/02/21   [name], MD  ezetimibe (ZETIA) 10 MG tablet Take 1 tablet (10 mg total) by mouth daily. 10/13/21   [name], MD  gabapentin (NEURONTIN) 800 MG tablet TAKE 1 TABLET BY MOUTH 3 TIMES DAILY 01/13/21   [name], MD  ketoconazole (NIZORAL) 2 % cream Apply 1 application topically daily. 02/25/21   [provider]  mupirocin ointment (BACTROBAN) 2 % Apply 1 application  topically 2 (two) times daily. 02/25/21   [provider]  oxyCODONE (OXY IR/ROXICODONE) 5 MG immediate release tablet Take 1 tablet (5 mg total) by mouth every 6 (six) hours as needed for moderate pain. 10/01/21   [name], MD  pantoprazole (PROTONIX) 40 MG tablet TAKE 1 TABLET BY MOUTH ONCE DAILY 12/10/20   [name], MD  polyethylene glycol (MIRALAX / GLYCOLAX) 17 g packet Take 17 g by mouth daily as needed for mild constipation or moderate constipation.    [provider]  rosuvastatin (CRESTOR) 40 MG tablet TAKE 1 TABLET BY MOUTH EACH EVENING Patient taking differently: Take 40 mg by mouth daily. 05/31/21   [name], MD     Allergies    Sulfonamide derivatives   Review of Systems   Review of  Systems Please see HPI for pertinent positives and negatives  Physical Exam BP 100/66   Pulse (!) 106   Temp 98.2 F (36.8 C)   Resp 17   Ht '5\' 2"'$  (1.575 m) Comment: Simultaneous filing. User may not have seen previous data.  Wt 77 kg Comment: Simultaneous filing. User may not have seen previous data.  SpO2 96%   BMI 31.05 kg/m   Physical Exam Vitals and nursing note reviewed.  Constitutional:      Appearance: She is obese.     Comments: Somnolent  HENT:     Head: Normocephalic and atraumatic.     Nose: Nose normal.     Mouth/Throat:     Mouth: Mucous membranes are dry.  Eyes:     Extraocular Movements: Extraocular movements intact.     Conjunctiva/sclera: Conjunctivae normal.     Pupils: Pupils are equal, round, and reactive to light.  Cardiovascular:     Rate and Rhythm: Tachycardia present.  Pulmonary:     Effort: Pulmonary effort is normal.     Breath sounds: Rhonchi present. No wheezing or rales.  Abdominal:     General: Abdomen is flat.     Palpations: Abdomen is soft.     Tenderness: There is no abdominal tenderness.  Musculoskeletal:        General: No swelling. Normal range of motion.     Cervical back: Neck supple.  Skin:    General: Skin is warm and dry.  Neurological:     General: No focal deficit present.     Mental Status: She is disoriented.     Cranial Nerves: No cranial nerve deficit.     Motor: Weakness (generalized, no focal deficit) present.  Psychiatric:        Mood and Affect: Mood normal.     ED Results / Procedures / Treatments   EKG None  Procedures Procedures  Medications Ordered in the ED Medications  cefTRIAXone (ROCEPHIN) 1 g in sodium chloride 0.9 % 100 mL IVPB (1 g Intravenous New Bag/Given 10/15/21 0648)  lactated ringers bolus 500 mL (0 mLs Intravenous Stopped 10/15/21 0624)  naloxone [facility]) injection 0.4 mg (0.4 mg Intravenous Given 10/15/21 0558)  lactated ringers bolus 1,000 mL (1,000 mLs Intravenous New Bag/Given  10/15/21 0624)    Initial Impression and Plan  Patient continued weakness, worse from her usual dementia, now unable to stay awake or walk per her baseline. Will recheck labs, including ABG to eval for change from earlier today as well as to check for hypercapnia. Consider elyte abnormality, infection or polypharmacy as well.   ED Course   Clinical Course as of 10/15/21 0649  Fri Oct 15, 2021  0515 CBC with leukocytosis, worsened from  prior ED visit.  [CS]  2482 CMP is unchanged from previous.  [CS]  5003 ABG does not show hypercapnia as a cause of her weakness.  [CS]  7048 Will try a small dose of Narcan to see if that affects her somnolence.  [CS]  [phone] Patient did not have any response to Narcan. Still tachycardic, minimal urine in bladder on cath, urine is dark. Will give additional IVF.  [CS]  [phone] UA has bacteria without other signs of infection, on a cath sample. Will add culture. Given leukocytosis and persistent weakness, will begin Abx and discuss admission with Hospitalist.  [CS]  [phone] Spoke with Dr. Josephine Cables, Hospitalist, who will evaluate for admission.  [CS]    Clinical Course User Index [CS] Truddie Hidden, MD     MDM Rules/Calculators/A&P Medical Decision Making Problems Addressed: Altered mental status, unspecified altered mental status type: acute illness or injury Bacteriuria: acute illness or injury Dehydration: acute illness or injury General weakness: acute illness or injury  Amount and/or Complexity of Data Reviewed Labs: ordered. Decision-making details documented in ED Course.  Risk Prescription drug management. Decision regarding hospitalization.    Final Clinical Impression(s) / ED Diagnoses Final diagnoses:  Altered mental status, unspecified altered mental status type  Dehydration  General weakness  Bacteriuria    Rx / DC Orders ED Discharge Orders     None        Truddie Hidden, MD 10/15/21 [phone]

## 2021-10-15 NOTE — ED Notes (Signed)
Son left to go home, updated on room assignment

## 2021-10-15 NOTE — H&P (Addendum)
History and Physical    Lamaiya Bevins XBJ:[id number] DOB: 21-Jan-1934 DOA: 10/15/2021  PCP: Donita Brooks, MD   Patient coming from: Home  Chief Complaint: Weakness/confusion  HPI: [name] is a 88 y.o. female with medical history significant for chronic atrial fibrillation not on anticoagulation due to falls, mild dementia, prior UTI, chronic pain, dyslipidemia, chronic diastolic heart failure, stage IIIa chronic kidney disease, and GERD who was brought to the ED yesterday on account of some worsening weakness at home.  She was also noted to have some mild confusion, but work-up was noted to be negative and therefore patient was sent home.  She returned to the ED earlier this morning with worsening weakness and confusion and patient states that she was very prone to falling and unable to ambulate with her walker.  She has also had poor oral intake over the last 2 days, no fever, vomiting, diarrhea, chills, or falls.  She has had prior history of Klebsiella UTI which was sensitive to Rocephin.   ED Course: Vital signs stable and patient is afebrile.  Minimally tachycardic with atrial fibrillation.  Leukocytosis of 15,900 noted and patient has been started on Rocephin and IV fluid.  CT head with some atrophy and no other acute changes.  Chest x-ray with some cardiomegaly noted.  Review of Systems: Could not be obtained given patient condition.  Past Medical History:  Diagnosis Date   [name] (abdominal aortic aneurysm) (HCC) 09/2013   3.6 cm   Acute and chronic respiratory failure with hypoxia (HCC)    Acute delirium    Acute encephalopathy    Acute pyelonephritis    Atrial fibrillation (HCC)    Chronic pain syndrome    Constipation    Dementia (HCC)    Dependent edema    bilateral legs    Depression    Diabetes mellitus    pre-diabetes   Diverticulosis    Diverticulosis    Dysuria    E coli bacteremia    Fall    Gross hematuria    Hydronephrosis with renal and ureteral  calculous obstruction    Hyperlipidemia    Hypertension    Hypoxemia    Idiopathic peripheral autonomic neuropathy    Infestation by bed bug    Nephrolithiasis    OSA on CPAP    Osteoarthritis    Osteopenia    T=  -2.1 in hip   Persistent mood (affective) disorder, unspecified (HCC)    Prediabetes    Rheumatoid arthritis (HCC)    Rheumatoid arthritis(714.0)    S/P carpal tunnel release    Sepsis secondary to UTI [facility])    Spinal stenosis    Unspecified fracture of fifth lumbar vertebra, initial encounter for closed fracture (HCC)    Unspecified fracture of t11-T12 vertebra, initial encounter for closed fracture (HCC)    Urinary incontinence    Urinary tract infection    hx of    Urolithiasis     Past Surgical History:  Procedure Laterality Date   APPENDECTOMY     CARPAL TUNNEL RELEASE  12/27/2006   Right subcutaneous ulnar nerve transfer -- Right open carpal tunnel release   CHOLECYSTECTOMY     CYSTOSCOPY W/ URETERAL STENT PLACEMENT Right 01/30/2018   Procedure: CYSTOSCOPY WITH STENT REPLACEMENT retrograde pylegram;  Surgeon: Crist Fat, MD;  Location: [facility];  Service: Urology;  Laterality: Right;   CYSTOSCOPY WITH URETEROSCOPY AND STENT PLACEMENT Right 03/01/2018   Procedure: CYSTOSCPY/RIGHT URETEROSCOPY HOLMIUM LASER LITHOTRIPSY AND STENT  EXCHANGE;  Surgeon: Crist Fat, MD;  Location: [facility];  Service: Urology;  Laterality: Right;   FOOT SURGERY     HIP ARTHROPLASTY Left 08/27/2020   Procedure: ARTHROPLASTY BIPOLAR HIP (HEMIARTHROPLASTY);  Surgeon: Oliver Barre, MD;  Location: [facility];  Service: Orthopedics;  Laterality: Left;   HOLMIUM LASER APPLICATION Right 03/01/2018   Procedure: HOLMIUM LASER APPLICATION;  Surgeon: Crist Fat, MD;  Location: [facility];  Service: Urology;  Laterality: Right;   JOINT REPLACEMENT     BTKR, RTHR   TOTAL KNEE ARTHROPLASTY  08/21/2007   Right total knee replacement     reports that she has never smoked. She has  never used smokeless tobacco. She reports that she does not drink alcohol and does not use drugs.  Allergies  Allergen Reactions   Sulfonamide Derivatives Hives and Rash    Family History  Problem Relation Age of Onset   Stroke Father    Coronary artery disease Son        CABG at age 44   Heart disease Son    Heart attack Son    Stroke Mother    Hypertension Sister    Diabetes Brother    Hypertension Maternal Aunt     Prior to Admission medications   Medication Sig Start Date End Date Taking? Authorizing Provider  acetaminophen (TYLENOL) 325 MG tablet Take 2 tablets (650 mg total) by mouth every 6 (six) hours. 09/01/20  Yes Johnson, Clanford L, MD  albuterol (VENTOLIN HFA) 108 (90 Base) MCG/ACT inhaler Inhale 2 puffs into the lungs every 4 (four) hours as needed for wheezing or shortness of breath. 09/02/20  Yes [provider]  ammonium lactate (AMLACTIN) 12 % cream Apply topically as needed for dry skin. 08/17/18  Yes Vivi Barrack, DPM  aspirin EC 81 MG tablet Take 1 po BID x 6 weeks then resume 1 po daily 09/01/20  Yes Johnson, Clanford L, MD  atenolol (TENORMIN) 25 MG tablet TAKE 1/2 TABLET BY MOUTH DAILY 06/28/21  Yes Donita Brooks, MD  Calcium Carbonate-Vitamin D3 600-400 MG-UNIT TABS Take 1 tablet by mouth daily. 02/04/20  Yes [provider]  diclofenac sodium (VOLTAREN) 1 % GEL Apply 2 g topically 4 (four) times daily. Rub into affected area of foot 2 to 4 times daily 08/17/18  Yes Vivi Barrack, DPM  DULoxetine (CYMBALTA) 30 MG capsule TAKE 1 CAPSULE BY MOUTH DAILY WITH 60 MG FOR TOTAL DAILY DOSE OF 90 MG Patient taking differently: Take 30 mg by mouth See admin instructions. Take 1 capsule by mouth daily with 60 mg for total daily dose of 90 mg 04/02/21  Yes Donita Brooks, MD  DULoxetine (CYMBALTA) 60 MG capsule TAKE 1 CAPSULE BY MOUTH DAILY WITH 30 MG FOR TOTAL DAILY DOSE OF 90 MG Patient taking differently: Take 60 mg by mouth See admin  instructions. Take 1 capsule by mouth with 30 mg for a total dose of 90 mg 04/02/21  Yes Donita Brooks, MD  gabapentin (NEURONTIN) 800 MG tablet TAKE 1 TABLET BY MOUTH 3 TIMES DAILY 01/13/21  Yes Donita Brooks, MD  ketoconazole (NIZORAL) 2 % cream Apply 1 application topically daily. 02/25/21  Yes [provider]  mupirocin ointment (BACTROBAN) 2 % Apply 1 application  topically 2 (two) times daily. 02/25/21  Yes [provider]  oxyCODONE (OXY IR/ROXICODONE) 5 MG immediate release tablet Take 1 tablet (5 mg total) by mouth every 6 (six) hours as needed for  moderate pain. 10/01/21  Yes Donita Brooks, MD  pantoprazole (PROTONIX) 40 MG tablet TAKE 1 TABLET BY MOUTH ONCE DAILY 12/10/20  Yes Donita Brooks, MD  polyethylene glycol (MIRALAX / GLYCOLAX) 17 g packet Take 17 g by mouth daily as needed for mild constipation or moderate constipation.   Yes [provider]  rosuvastatin (CRESTOR) 40 MG tablet TAKE 1 TABLET BY MOUTH EACH EVENING Patient taking differently: Take 40 mg by mouth daily. 05/31/21  Yes Donita Brooks, MD  ezetimibe (ZETIA) 10 MG tablet Take 1 tablet (10 mg total) by mouth daily. Patient not taking: Reported on 10/15/2021 10/13/21   Donita Brooks, MD    Physical Exam: Vitals:   10/15/21 0500 10/15/21 0630 10/15/21 0645 10/15/21 0726  BP: 111/72  100/66 104/67  Pulse:   (!) 106 98  Resp: (!) 21 19 17  (!) 21  Temp:      SpO2: 95%  96% 98%  Weight:      Height:        Constitutional: NAD, calm, comfortable, confused, obese Vitals:   10/15/21 0500 10/15/21 0630 10/15/21 0645 10/15/21 0726  BP: 111/72  100/66 104/67  Pulse:   (!) 106 98  Resp: (!) 21 19 17  (!) 21  Temp:      SpO2: 95%  96% 98%  Weight:      Height:       Eyes: lids and conjunctivae normal Neck: normal, supple Respiratory: clear to auscultation bilaterally. Normal respiratory effort. No accessory muscle use.  On 3 L nasal cannula Cardiovascular: Irregular and  mildly tachycardic Abdomen: no tenderness, no distention. Bowel sounds positive.  Musculoskeletal:  No edema. Skin: no rashes, lesions, ulcers.  Psychiatric: Flat affect  Labs on Admission: I have personally reviewed following labs and imaging studies  CBC: Recent Labs  Lab 10/14/21 1847 10/15/21 0438  WBC 10.0 15.9*  NEUTROABS 7.8* 14.1*  HGB 13.0 11.8*  HCT 41.0 37.2  MCV 91.3 90.3  PLT 158 160   Basic Metabolic Panel: Recent Labs  Lab 10/14/21 1847 10/15/21 0438  NA 134* 132*  K 4.4 3.9  CL 96* 95*  CO2 29 27  GLUCOSE 107* 133*  BUN 19 21  CREATININE 1.05* 1.04*  CALCIUM 8.9 8.7*   GFR: Estimated Creatinine Clearance: 38 mL/min (A) (by C-G formula based on SCr of 1.04 mg/dL (H)). Liver Function Tests: Recent Labs  Lab 10/14/21 1847 10/15/21 0438  AST 18 19  ALT 12 13  ALKPHOS 63 55  BILITOT 1.3* 1.6*  PROT 7.1 6.7  ALBUMIN 3.6 3.2*   No results for input(s): "LIPASE", "AMYLASE" in the last 168 hours. No results for input(s): "AMMONIA" in the last 168 hours. Coagulation Profile: No results for input(s): "INR", "PROTIME" in the last 168 hours. Cardiac Enzymes: No results for input(s): "CKTOTAL", "CKMB", "CKMBINDEX", "TROPONINI" in the last 168 hours. BNP (last 3 results) No results for input(s): "PROBNP" in the last 8760 hours. HbA1C: No results for input(s): "HGBA1C" in the last 72 hours. CBG: No results for input(s): "GLUCAP" in the last 168 hours. Lipid Profile: No results for input(s): "CHOL", "HDL", "LDLCALC", "TRIG", "CHOLHDL", "LDLDIRECT" in the last 72 hours. Thyroid Function Tests: No results for input(s): "TSH", "T4TOTAL", "FREET4", "T3FREE", "THYROIDAB" in the last 72 hours. Anemia Panel: No results for input(s): "VITAMINB12", "FOLATE", "FERRITIN", "TIBC", "IRON", "RETICCTPCT" in the last 72 hours. Urine analysis:    Component Value Date/Time   COLORURINE AMBER (A) 10/15/2021 0421   APPEARANCEUR HAZY (  A) 10/15/2021 0421   LABSPEC  1.026 10/15/2021 0421   PHURINE 5.0 10/15/2021 0421   GLUCOSEU NEGATIVE 10/15/2021 0421   GLUCOSEU NEG mg/dL 95/63/8756 4332   HGBUR NEGATIVE 10/15/2021 0421   HGBUR negative 03/06/2008 0846   BILIRUBINUR NEGATIVE 10/15/2021 0421   KETONESUR 5 (A) 10/15/2021 0421   PROTEINUR 30 (A) 10/15/2021 0421   UROBILINOGEN 0.2 09/20/2013 1535   NITRITE NEGATIVE 10/15/2021 0421   LEUKOCYTESUR NEGATIVE 10/15/2021 0421    Radiological Exams on Admission: DG Chest 2 View  Result Date: 10/14/2021 CLINICAL DATA:  hypoxia, evaluate for PNA EXAM: CHEST - 2 VIEW COMPARISON:  09/02/2021 FINDINGS: Cardiomegaly. Tortuous, calcified aorta. No overt edema. No confluent opacities or effusions. No acute bony abnormality. IMPRESSION: Cardiomegaly.  No active disease. Electronically Signed   By: Charlett Nose M.D.   On: 10/14/2021 19:19   CT Head Wo Contrast  Result Date: 10/14/2021 CLINICAL DATA:  Mental status change, unknown cause EXAM: CT HEAD WITHOUT CONTRAST TECHNIQUE: Contiguous axial images were obtained from the base of the skull through the vertex without intravenous contrast. RADIATION DOSE REDUCTION: This exam was performed according to the departmental dose-optimization program which includes automated exposure control, adjustment of the mA and/or kV according to patient size and/or use of iterative reconstruction technique. COMPARISON:  03/12/2021 FINDINGS: Brain: There is atrophy and chronic small vessel disease changes. No acute intracranial abnormality. Specifically, no hemorrhage, hydrocephalus, mass lesion, acute infarction, or significant intracranial injury. Vascular: No hyperdense vessel or unexpected calcification. Skull: No acute calvarial abnormality. Sinuses/Orbits: No acute findings Other: None IMPRESSION: Atrophy, chronic microvascular disease. No acute intracranial abnormality. Electronically Signed   By: Charlett Nose M.D.   On: 10/14/2021 19:18    EKG: Independently reviewed. Afib  100bpm.  Assessment/Plan Principal Problem:   UTI (urinary tract infection) Active Problems:   Dementia (HCC)   Chronic pain syndrome   Atrial fibrillation, chronic (HCC)   Leukocytosis   Stage 3a chronic kidney disease (CKD) (HCC)   Acute metabolic encephalopathy   Acute cystitis without hematuria    Acute metabolic encephalopathy-multifactorial -In the setting of UTI with noted history of dementia as well as chronic narcotic use -Continue treatment with Rocephin for UTI with prior history of Klebsiella -Monitor urine cultures -Continue IV fluid -Monitor repeat labs  Chronic atrial fibrillation -Continue atenolol for rate control -Patient is not on anticoagulation due to history of falls  Chronic low back pain -Hold Cymbalta, gabapentin, and narcotics until mentation improves -Tylenol as needed for now  GERD -PPI  Dyslipidemia -Continue Zetia and statin  Debility/deconditioning -Has history of falls -Fall precaution -PT evaluation with likely need for placement  CKD stage IIIa -Near baseline creatinine levels, monitor  Chronic diastolic CHF -Currently appears compensated and will receive IV fluid -Continue atenolol, Crestor, and aspirin  Obesity -BMI 31.05 -Lifestyle changes outpatient   DVT prophylaxis: Lovenox Code Status: Full Family Communication: Son at bedside Disposition Plan:Admit for tx of UTI-acute encephalopathy Consults called:None Admission status: Inpatient, Tele  Severity of Illness: The appropriate patient status for this patient is INPATIENT. Inpatient status is judged to be reasonable and necessary in order to provide the required intensity of service to ensure the patient's safety. The patient's presenting symptoms, physical exam findings, and initial radiographic and laboratory data in the context of their chronic comorbidities is felt to place them at high risk for further clinical deterioration. Furthermore, it is not anticipated  that the patient will be medically stable for discharge from the hospital within  2 midnights of admission.   * I certify that at the point of admission it is my clinical judgment that the patient will require inpatient hospital care spanning beyond 2 midnights from the point of admission due to high intensity of service, high risk for further deterioration and high frequency of surveillance required.*   Rowland Ericsson D Montario Zilka DO [facility]  If 7PM-7AM, please contact night-coverage [email or web address]  10/15/2021, 7:51 AM

## 2021-10-15 NOTE — ED Notes (Signed)
Pt resting quietly in bed, son at bedside. Pt sleeping but easily wakes to verbal stimuli. Son updated on status

## 2021-10-15 NOTE — ED Notes (Signed)
Hospitalist at bedside 

## 2021-10-15 NOTE — Plan of Care (Signed)
  Problem: Acute Rehab PT Goals(only PT should resolve) Goal: Pt Will Go Supine/Side To Sit Outcome: Progressing Flowsheets (Taken 10/15/2021 1109) Pt will go Supine/Side to Sit:  with moderate assist  with minimal assist Goal: Patient Will Transfer Sit To/From Stand Outcome: Progressing Flowsheets (Taken 10/15/2021 1109) Patient will transfer sit to/from stand:  with minimal assist  with moderate assist Goal: Pt Will Transfer Bed To Chair/Chair To Bed Outcome: Progressing Flowsheets (Taken 10/15/2021 1109) Pt will Transfer Bed to Chair/Chair to Bed:  with mod assist  with min assist Goal: Pt Will Perform Standing Balance Or Pre-Gait Outcome: Progressing Flowsheets (Taken 10/15/2021 1109) Pt will perform standing balance or pre-gait:  with minimal assist  with moderate assist  1-2 min  with bilateral UE support Goal: Pt Will Ambulate Outcome: Progressing Flowsheets (Taken 10/15/2021 1109) Pt will Ambulate:  25 feet  with minimal assist  with moderate assist   11:10 AM, 10/15/21 Lestine Box, S/PT

## 2021-10-15 NOTE — ED Notes (Signed)
Pt placed in bedside chair by PT/OT, no s/s of acute distress

## 2021-10-15 NOTE — ED Triage Notes (Signed)
Pt brought in by [facility] from home. Pts daughter concerned for continued weakness. Hx of dementia and UTI's. Pt discharged earlier tonight for same.

## 2021-10-15 NOTE — Evaluation (Signed)
Physical Therapy Evaluation Patient Details Name: [name] MRN: [id number] DOB: May 29, 1933 Today's Date: 10/15/2021  History of Present Illness  [name] is a 88 y.o. female with medical history significant for chronic atrial fibrillation not on anticoagulation due to falls, mild dementia, prior UTI, chronic pain, dyslipidemia, chronic diastolic heart failure, stage IIIa chronic kidney disease, and GERD who was brought to the ED yesterday on account of some worsening weakness at home.  She was also noted to have some mild confusion, but work-up was noted to be negative and therefore patient was sent home.  She returned to the ED earlier this morning with worsening weakness and confusion and patient states that she was very prone to falling and unable to ambulate with her walker.  She has also had poor oral intake over the last 2 days, no fever, vomiting, diarrhea, chills, or falls.  She has had prior history of Klebsiella UTI which was sensitive to Rocephin.   Clinical Impression  Patient presents supine in bed and consents to PT evaluation. Patient is mod assist with bed mobility requiring HHA with labored movement and extended time needed. Patient demonstrates poor trunk control and generalized weakness affecting supine to sit. Patient is mod assist with transfers and use of RW. Significant amount of extended time needed due to lack of spatial awareness and need for verbal cueing. Forward trunk lean impacts patient's base of support and impacts balancing ability. Difficulty with weight shifting and coordination of B LE. Limited to a few steps in room with gait. Mod assist and RW needed. Moderate gait deficits impacting stability and balance. Primarily limited by fatigue. Vitals assessed after transfers/gait with SpO2 at 92% and in therapeutic range. Patient tolerated sitting up in chair after therapy. Patient will benefit from continued skilled physical therapy in hospital and recommended  venue below to increase strength, balance, endurance for safe ADLs and gait.      Recommendations for follow up therapy are one component of a multi-disciplinary discharge planning process, led by the attending physician.  Recommendations may be updated based on patient status, additional functional criteria and insurance authorization.  Follow Up Recommendations Skilled nursing-short term rehab (<3 hours/day)    Assistance Recommended at Discharge Set up Supervision/Assistance  Patient can return home with the following  A lot of help with walking and/or transfers;Help with stairs or ramp for entrance;Assistance with cooking/housework;A lot of help with bathing/dressing/bathroom    Equipment Recommendations None recommended by PT  Recommendations for Other Services       Functional Status Assessment Patient has had a recent decline in their functional status and demonstrates the ability to make significant improvements in function in a reasonable and predictable amount of time.     Precautions / Restrictions Precautions Precautions: Fall Restrictions Weight Bearing Restrictions: No      Mobility  Bed Mobility Overal bed mobility: Needs Assistance Bed Mobility: Supine to Sit     Supine to sit: Mod assist     General bed mobility comments: Mod assist with supine to sit. Labored movement and extended time needed. HHA provided due to poor trunk control and generalized weakness.    Transfers Overall transfer level: Needs assistance Equipment used: Rolling walker (2 wheels) Transfers: Sit to/from Stand, Bed to chair/wheelchair/BSC Sit to Stand: Mod assist   Step pivot transfers: Mod assist       General transfer comment: Mod assist with transfers with use of RW. Significant amount of extended time needed along with lack of  spatial awareness. Forward lean impacts base of support and balance during transfer. Difficulty with weight shifting and coordinating B LE. Moderate  verbal cues provided.    Ambulation/Gait Ambulation/Gait assistance: Mod assist Gait Distance (Feet): 4 Feet Assistive device: Rolling walker (2 wheels) Gait Pattern/deviations: Step-to pattern, Decreased step length - right, Decreased step length - left, Decreased stride length, Decreased weight shift to right, Decreased weight shift to left, Trunk flexed, Narrow base of support Gait velocity: decreased     General Gait Details: Limited to a few steps in room with mod assist and RW. Moderate gait deficits impacting balance and stability during gait. Limited by fatigue  Stairs            Wheelchair Mobility    Modified Rankin (Stroke Patients Only)       Balance Overall balance assessment: Needs assistance Sitting-balance support: Bilateral upper extremity supported, Feet unsupported Sitting balance-Leahy Scale: Fair Sitting balance - Comments: Able to sit EOB with UE support but struggles to maintain midline   Standing balance support: Bilateral upper extremity supported, Reliant on assistive device for balance, During functional activity Standing balance-Leahy Scale: Fair Standing balance comment: Fair with reliance on RW.                             Pertinent Vitals/Pain Pain Assessment Pain Assessment: Faces Faces Pain Scale: Hurts little more Pain Location: B hands Pain Descriptors / Indicators: Aching Pain Intervention(s): Limited activity within patient's tolerance, Monitored during session, Repositioned    Home Living Family/patient expects to be discharged to:: Private residence Living Arrangements: Other relatives Available Help at Discharge: Family;Available 24 hours/day Type of Home: Mobile home Home Access: Ramped entrance       Home Layout: One level Home Equipment: Wheelchair - manual;Grab bars - tub/shower;Toilet riser;Shower Land (2 wheels);Hospital bed      Prior Function Prior Level of Function : Needs assist        Physical Assist : Mobility (physical);ADLs (physical) Mobility (physical): Gait;Bed mobility;Transfers ADLs (physical): IADLs Mobility Comments: Patient reports ambulating at home with RW but needs assist in which "somebody has to be with me." Not a community ambulator and not driving ADLs Comments: Patient reports needing total assist with most ADL's. Could not state ADL or functional task she completes individually     Hand Dominance   Dominant Hand: Right    Extremity/Trunk Assessment   Upper Extremity Assessment Upper Extremity Assessment: Generalized weakness    Lower Extremity Assessment Lower Extremity Assessment: Generalized weakness    Cervical / Trunk Assessment Cervical / Trunk Assessment: Kyphotic  Communication   Communication: HOH  Cognition Arousal/Alertness: Awake/alert Behavior During Therapy: WFL for tasks assessed/performed Overall Cognitive Status: Within Functional Limits for tasks assessed                                          General Comments      Exercises     Assessment/Plan    PT Assessment Patient needs continued PT services  PT Problem List Decreased strength;Decreased activity tolerance;Decreased balance;Decreased mobility;Decreased coordination       PT Treatment Interventions DME instruction;Gait training;Functional mobility training;Therapeutic activities;Therapeutic exercise;Balance training    PT Goals (Current goals can be found in the Care Plan section)  Acute Rehab PT Goals Patient Stated Goal: return home PT Goal Formulation: With patient  Time For Goal Achievement: 10/29/21    Frequency Min 3X/week     Co-evaluation               AM-PAC PT "6 Clicks" Mobility  Outcome Measure Help needed turning from your back to your side while in a flat bed without using bedrails?: A Lot Help needed moving from lying on your back to sitting on the side of a flat bed without using bedrails?: A  Lot Help needed moving to and from a bed to a chair (including a wheelchair)?: A Lot Help needed standing up from a chair using your arms (e.g., wheelchair or bedside chair)?: A Lot Help needed to walk in hospital room?: A Lot Help needed climbing 3-5 steps with a railing? : Total 6 Click Score: 11    End of Session   Activity Tolerance: Patient tolerated treatment well;No increased pain;Patient limited by fatigue Patient left: in chair;with call bell/phone within reach Nurse Communication: Mobility status PT Visit Diagnosis: Unsteadiness on feet (R26.81);Other abnormalities of gait and mobility (R26.89);Muscle weakness (generalized) (M62.81)    Time: 4098-1191 PT Time Calculation (min) (ACUTE ONLY): 29 min   Charges:   PT Evaluation $PT Eval Moderate Complexity: 1 Mod          11:08 AM, 10/15/21 Lestine Box, S/PT

## 2021-10-16 DIAGNOSIS — N3000 Acute cystitis without hematuria: Secondary | ICD-10-CM | POA: Diagnosis not present

## 2021-10-16 LAB — BASIC METABOLIC PANEL
Anion gap: 8 (ref 5–15)
BUN: 22 mg/dL (ref 8–23)
CO2: 28 mmol/L (ref 22–32)
Calcium: 8.5 mg/dL — ABNORMAL LOW (ref 8.9–10.3)
Chloride: 99 mmol/L (ref 98–111)
Creatinine, Ser: 0.9 mg/dL (ref 0.44–1.00)
GFR, Estimated: >60 mL/min (ref >60)
Glucose, Bld: 103 mg/dL — ABNORMAL HIGH (ref 70–99)
Potassium: 3.7 mmol/L (ref 3.5–5.1)
Sodium: 135 mmol/L (ref 135–145)

## 2021-10-16 LAB — CBC
HCT: 34.3 % — ABNORMAL LOW (ref 36.0–46.0)
Hemoglobin: 11 g/dL — ABNORMAL LOW (ref 12.0–15.0)
MCH: 29.1 pg (ref 26.0–34.0)
MCHC: 32.1 g/dL (ref 30.0–36.0)
MCV: 90.7 fL (ref 80.0–100.0)
Platelets: 136 10*3/uL — ABNORMAL LOW (ref 150–400)
RBC: 3.78 MIL/uL — ABNORMAL LOW (ref 3.87–5.11)
RDW: 13.4 % (ref 11.5–15.5)
WBC: 9.5 10*3/uL (ref 4.0–10.5)
nRBC: 0 % (ref 0.0–0.2)

## 2021-10-16 LAB — MAGNESIUM: Magnesium: 1.6 mg/dL — ABNORMAL LOW (ref 1.7–2.4)

## 2021-10-16 MED ORDER — HYDROXYZINE HCL 25 MG PO TABS
25.0000 mg | ORAL_TABLET | Freq: Three times a day (TID) | ORAL | Status: DC | PRN
  Administered 2021-10-16 – 2021-10-17 (×2): 25 mg via ORAL
  Filled 2021-10-16 (×2): qty 1

## 2021-10-16 MED ORDER — MAGNESIUM SULFATE 2 GM/50ML IV SOLN
2.0000 g | Freq: Once | INTRAVENOUS | Status: AC
  Administered 2021-10-16: 2 g via INTRAVENOUS
  Filled 2021-10-16: qty 50

## 2021-10-16 NOTE — Progress Notes (Signed)
PROGRESS NOTE    [name]  HYQ:[id number] DOB: 1934/01/02 DOA: 10/15/2021 PCP: Donita Brooks, MD   Brief Narrative:    [name] is a 88 y.o. female with medical history significant for chronic atrial fibrillation not on anticoagulation due to falls, mild dementia, prior UTI, chronic pain, dyslipidemia, chronic diastolic heart failure, stage IIIa chronic kidney disease, and GERD who was brought to the ED yesterday on account of some worsening weakness at home.  Patient was admitted with acute metabolic encephalopathy-multifactorial in the setting of UTI with history of dementia as well as chronic narcotic use.  Assessment & Plan:   Principal Problem:   UTI (urinary tract infection) Active Problems:   Dementia (HCC)   Chronic pain syndrome   Atrial fibrillation, chronic (HCC)   Leukocytosis   Stage 3a chronic kidney disease (CKD) (HCC)   Acute metabolic encephalopathy   Acute cystitis without hematuria  Assessment and Plan:   Acute metabolic encephalopathy-multifactorial -In the setting of UTI with noted history of dementia as well as chronic narcotic use -Continue treatment with Rocephin for UTI with prior history of Klebsiella -Monitor urine cultures which are pending -Monitor repeat labs   Chronic atrial fibrillation -Continue atenolol for rate control -Patient is not on anticoagulation due to history of falls   Chronic low back pain -Hold Cymbalta, gabapentin, and narcotics until mentation improves -Tylenol as needed for now   GERD -PPI   Dyslipidemia -Continue Zetia and statin   Debility/deconditioning -Has history of falls -Fall precaution -PT evaluation recommending SNF  Hypomagnesemia -Replete and reevaluate   CKD stage IIIa-stable -Near baseline creatinine levels, monitor   Chronic diastolic CHF -Currently appears compensated and will receive IV fluid -Continue atenolol, Crestor, and aspirin   Obesity -BMI 31.05 -Lifestyle  changes outpatient    DVT prophylaxis:Lovenox Code Status: Full Family Communication: Tried calling son on 6/17 with no response Disposition Plan:  Status is: Inpatient Remains inpatient appropriate because: Ongoing confusion and need for IV medications.   Consultants:  None  Procedures:  None  Antimicrobials:  Anti-infectives (From admission, onward)    Start     Dose/Rate Route Frequency Ordered Stop   10/16/21 0600  cefTRIAXone (ROCEPHIN) 1 g in sodium chloride 0.9 % 100 mL IVPB        1 g 200 mL/hr over 30 Minutes Intravenous Every 24 hours 10/15/21 0754     10/15/21 0645  cefTRIAXone (ROCEPHIN) 1 g in sodium chloride 0.9 % 100 mL IVPB        1 g 200 mL/hr over 30 Minutes Intravenous  Once 10/15/21 9528 10/15/21 0721       Subjective: Patient seen and evaluated today with no new acute complaints or concerns. No acute concerns or events noted overnight.  Objective: Vitals:   10/15/21 1200 10/15/21 1755 10/15/21 2155 10/16/21 0602  BP: 99/62 (!) 88/59 97/62 122/80  Pulse: 96 (!) 104 98 99  Resp: 18 17 19 19   Temp: 99 F (37.2 C) 98.3 F (36.8 C)  97.6 F (36.4 C)  TempSrc: Oral Oral    SpO2: 95% 95% 94% 94%  Weight:      Height:        Intake/Output Summary (Last 24 hours) at 10/16/2021 1033 Last data filed at 10/16/2021 0900 Gross per 24 hour  Intake 592.79 ml  Output 400 ml  Net 192.79 ml   Filed Weights   10/15/21 0359  Weight: 77 kg    Examination:  General exam: Appears  calm and comfortable  Respiratory system: Clear to auscultation. Respiratory effort normal. Cardiovascular system: S1 & S2 heard, RRR.  Gastrointestinal system: Abdomen is soft Central nervous system: Alert and awake Extremities: No edema Skin: No significant lesions noted Psychiatry: Flat affect.    Data Reviewed: I have personally reviewed following labs and imaging studies  CBC: Recent Labs  Lab 10/14/21 1847 10/15/21 0438 10/16/21 0528  WBC 10.0 15.9* 9.5   NEUTROABS 7.8* 14.1*  --   HGB 13.0 11.8* 11.0*  HCT 41.0 37.2 34.3*  MCV 91.3 90.3 90.7  PLT 158 160 136*   Basic Metabolic Panel: Recent Labs  Lab 10/14/21 1847 10/15/21 0438 10/16/21 0528  NA 134* 132* 135  K 4.4 3.9 3.7  CL 96* 95* 99  CO2 29 27 28   GLUCOSE 107* 133* 103*  BUN 19 21 22   CREATININE 1.05* 1.04* 0.90  CALCIUM 8.9 8.7* 8.5*  MG  --   --  1.6*   GFR: Estimated Creatinine Clearance: 43.9 mL/min (by C-G formula based on SCr of 0.9 mg/dL). Liver Function Tests: Recent Labs  Lab 10/14/21 1847 10/15/21 0438  AST 18 19  ALT 12 13  ALKPHOS 63 55  BILITOT 1.3* 1.6*  PROT 7.1 6.7  ALBUMIN 3.6 3.2*   No results for input(s): "LIPASE", "AMYLASE" in the last 168 hours. No results for input(s): "AMMONIA" in the last 168 hours. Coagulation Profile: No results for input(s): "INR", "PROTIME" in the last 168 hours. Cardiac Enzymes: No results for input(s): "CKTOTAL", "CKMB", "CKMBINDEX", "TROPONINI" in the last 168 hours. BNP (last 3 results) No results for input(s): "PROBNP" in the last 8760 hours. HbA1C: No results for input(s): "HGBA1C" in the last 72 hours. CBG: No results for input(s): "GLUCAP" in the last 168 hours. Lipid Profile: No results for input(s): "CHOL", "HDL", "LDLCALC", "TRIG", "CHOLHDL", "LDLDIRECT" in the last 72 hours. Thyroid Function Tests: Recent Labs    10/15/21 0438  TSH 1.452   Anemia Panel: No results for input(s): "VITAMINB12", "FOLATE", "FERRITIN", "TIBC", "IRON", "RETICCTPCT" in the last 72 hours. Sepsis Labs: No results for input(s): "PROCALCITON", "LATICACIDVEN" in the last 168 hours.  No results found for this or any previous visit (from the past 240 hour(s)).       Radiology Studies: DG Chest 2 View  Result Date: 10/14/2021 CLINICAL DATA:  hypoxia, evaluate for PNA EXAM: CHEST - 2 VIEW COMPARISON:  09/02/2021 FINDINGS: Cardiomegaly. Tortuous, calcified aorta. No overt edema. No confluent opacities or effusions.  No acute bony abnormality. IMPRESSION: Cardiomegaly.  No active disease. Electronically Signed   By: Charlett Nose M.D.   On: 10/14/2021 19:19   CT Head Wo Contrast  Result Date: 10/14/2021 CLINICAL DATA:  Mental status change, unknown cause EXAM: CT HEAD WITHOUT CONTRAST TECHNIQUE: Contiguous axial images were obtained from the base of the skull through the vertex without intravenous contrast. RADIATION DOSE REDUCTION: This exam was performed according to the departmental dose-optimization program which includes automated exposure control, adjustment of the mA and/or kV according to patient size and/or use of iterative reconstruction technique. COMPARISON:  03/12/2021 FINDINGS: Brain: There is atrophy and chronic small vessel disease changes. No acute intracranial abnormality. Specifically, no hemorrhage, hydrocephalus, mass lesion, acute infarction, or significant intracranial injury. Vascular: No hyperdense vessel or unexpected calcification. Skull: No acute calvarial abnormality. Sinuses/Orbits: No acute findings Other: None IMPRESSION: Atrophy, chronic microvascular disease. No acute intracranial abnormality. Electronically Signed   By: Charlett Nose M.D.   On: 10/14/2021 19:18  Scheduled Meds:  acetaminophen  650 mg Oral Q6H   aspirin EC  81 mg Oral Daily   atenolol  12.5 mg Oral Daily   calcium-vitamin D  1 tablet Oral Daily   diclofenac sodium  2 g Topical QID   ezetimibe  10 mg Oral Daily   ketoconazole  1 Application Topical Daily   pantoprazole  40 mg Oral Daily   rosuvastatin  40 mg Oral Daily   Continuous Infusions:  cefTRIAXone (ROCEPHIN)  IV 1 g (10/16/21 0605)   magnesium sulfate bolus IVPB       LOS: 1 day    Time spent: 35 minutes    Batoul Limes Hoover Brunette, DO [facility]  If 7PM-7AM, please contact night-coverage [email or web address] 10/16/2021, 10:33 AM

## 2021-10-16 NOTE — NC FL2 (Signed)
[place] LEVEL OF CARE SCREENING TOOL     IDENTIFICATION  Patient Name: [name] Birthdate: 1933-11-09 Sex: female Admission Date (Current Location): 10/15/2021  [place] and [place] Number:  Kathleen Argue [id number] [place] and Address:  [facility] [place] [address], [place], [place] [address]      Provider Number: [id number]  Attending Physician Name and Address:  Rodena Goldmann, DO  Relative Name and Phone Number:  Laverle Hobby Son [phone]    Current Level of Care: Hospital Recommended Level of Care: [place] Prior Approval Number:    Date Approved/Denied:   PASRR Number: [id number]  Discharge Plan: SNF    Current Diagnoses: Patient Active Problem List   Diagnosis Date Noted   UTI (urinary tract infection) 10/15/2021   Acute cystitis without hematuria    Stage 3a chronic kidney disease (CKD) ([place]) 02/13/2021   Dehydration 02/13/2021   Lactic acidosis 02/13/2021   Hypokalemia 31/54/0086   Metabolic alkalosis 76/19/5093   Acute metabolic encephalopathy 26/71/2458   Closed left hip fracture ([place]) 08/25/2020   Leukocytosis 08/25/2020   Hyperglycemia 08/25/2020   Elevated brain natriuretic peptide (BNP) level 08/25/2020   GERD (gastroesophageal reflux disease) 08/25/2020   Cerebrovascular accident (CVA) due to stenosis of basilar artery ([place]) 07/17/2019   Dyspnea 07/09/2019   Chronic diastolic CHF (congestive heart failure) ([place]) 07/09/2019   Chest pain    Coronary artery calcification seen on CT scan    Gait abnormality 01/24/2019   Tremor 01/24/2019   Memory loss 01/24/2019   Chronic bilateral low back pain without sciatica 06/06/2018   At risk for falls 06/06/2018   Senile osteoporosis 06/06/2018   Atelectasis of left lung 04/06/2018   Acute encephalopathy 02/09/2018   ESBL (extended spectrum beta-lactamase) producing bacteria infection 02/02/2018   Atrial fibrillation, chronic ([place])  02/02/2018   Constipation 01/31/2018   Urolithiasis 01/30/2018   T12 compression fracture ([place]) 09/04/2017   Fall at home, initial encounter 07/03/2017   Tachycardia 02/03/2017   Chronic pain syndrome 02/02/2017   Dizziness 12/15/2016   Inflammation of sacroiliac joint ([place]) 05/07/2015   Lumbar spondylosis with myelopathy 02/11/2014   Peripheral polyneuropathy 02/11/2014   Opioid dependence (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[place]) 01/03/2013   OSA (obstructive sleep apnea) 06/29/2012   Dementia ([place]) 06/29/2012   Pre-diabetes 06/29/2012   Hypoxia, sleep related 06/29/2012   RA (rheumatoid arthritis) ([place]) 06/29/2012   Nonspecific abnormal results of cardiovascular function study 06/02/2011   Nonspecific abnormal electrocardiogram (ECG) (EKG) 05/18/2011   Osteoporosis 04/09/2008   Hyperlipidemia 05/12/2006   DEPENDENT EDEMA, LEGS, BILATERAL 05/12/2006   PERIPHERAL NEUROPATHY 03/11/2006   Essential hypertension 03/11/2006    Orientation RESPIRATION BLADDER Height & Weight     Self, Place  Normal Incontinent, External catheter Weight: 169 lb 12.1 oz (77 kg) (Simultaneous filing. User may not have seen previous data.) Height:  '5\' 2"'$  (157.5 cm) (Simultaneous filing. User may not have seen previous data.)  BEHAVIORAL SYMPTOMS/MOOD NEUROLOGICAL BOWEL NUTRITION STATUS      Continent Diet (see discharge summary)  AMBULATORY STATUS COMMUNICATION OF NEEDS Skin   Limited Assist Verbally Normal                       Personal Care Assistance Level of Assistance  Bathing,  Feeding, Dressing Bathing Assistance: Limited assistance Feeding assistance: Limited assistance Dressing Assistance: Limited assistance     Functional Limitations Info  Sight, Hearing, Speech Sight Info:  Adequate Hearing Info: Impaired Speech Info: Adequate    SPECIAL CARE FACTORS FREQUENCY  PT (By licensed PT), OT (By licensed OT)     PT Frequency: 5x week OT Frequency: 5x week            Contractures Contractures Info: Not present    Additional Factors Info  Code Status, Allergies Code Status Info: full Allergies Info: Sulfonamide Derivatives           Current Medications (10/16/2021):  This is the current hospital active medication list Current Facility-Administered Medications  Medication Dose Route Frequency Provider Last Rate Last Admin   acetaminophen (TYLENOL) tablet 650 mg  650 mg Oral Q6H PRN Manuella Ghazi, Pratik D, DO       Or   acetaminophen (TYLENOL) suppository 650 mg  650 mg Rectal Q6H PRN Manuella Ghazi, Pratik D, DO       acetaminophen (TYLENOL) tablet 650 mg  650 mg Oral Q6H Shah, Pratik D, DO   650 mg at 10/16/21 1230   albuterol (VENTOLIN HFA) 108 (90 Base) MCG/ACT inhaler 2 puff  2 puff Inhalation Q4H PRN Manuella Ghazi, Pratik D, DO       aspirin EC tablet 81 mg  81 mg Oral Daily Manuella Ghazi, Pratik D, DO   81 mg at 10/16/21 0809   atenolol (TENORMIN) tablet 12.5 mg  12.5 mg Oral Daily Manuella Ghazi, Pratik D, DO   12.5 mg at 10/16/21 0809   calcium-vitamin D (OSCAL WITH D) 500-5 MG-MCG per tablet 1 tablet  1 tablet Oral Daily Manuella Ghazi, Pratik D, DO   1 tablet at 10/16/21 0809   cefTRIAXone (ROCEPHIN) 1 g in sodium chloride 0.9 % 100 mL IVPB  1 g Intravenous Q24H Manuella Ghazi, Pratik D, DO 200 mL/hr at 10/16/21 0605 1 g at 10/16/21 5498   diclofenac sodium (VOLTAREN) 1 % transdermal gel 2 g  2 g Topical QID Manuella Ghazi, Pratik D, DO   2 g at 10/16/21 1343   ezetimibe (ZETIA) tablet 10 mg  10 mg Oral Daily Manuella Ghazi, Pratik D, DO   10 mg at 10/16/21 0810   ketoconazole (NIZORAL) 2 % cream 1 Application  1 Application Topical Daily Heath Lark D, DO   1 Application at 26/41/58 0810   ondansetron (ZOFRAN) tablet 4 mg  4 mg Oral Q6H PRN Manuella Ghazi, Pratik D, DO       Or   ondansetron (ZOFRAN) injection 4 mg  4 mg Intravenous  Q6H PRN Manuella Ghazi, Pratik D, DO       pantoprazole (PROTONIX) EC tablet 40 mg  40 mg Oral Daily Manuella Ghazi, Pratik D, DO   40 mg at 10/16/21 0809   polyethylene glycol (MIRALAX / GLYCOLAX) packet 17 g  17 g Oral Daily PRN Manuella Ghazi, Pratik D, DO       rosuvastatin (CRESTOR) tablet 40 mg  40 mg Oral Daily Manuella Ghazi, Pratik D, DO   40 mg at 10/16/21 0809     Discharge Medications: Please see discharge summary for a list of discharge medications.  Relevant Imaging Results:  Relevant Lab Results:   Additional Information ssn:[id number]  Joanne Chars, LCSW

## 2021-10-16 NOTE — TOC Initial Note (Signed)
Transition of Care [facility]) - Initial/Assessment Note    Patient Details  Name: [name] MRN: [id number] Date of Birth: 10/10/1933  [facility] CM/SW Contact:    Joanne Chars, LCSW Phone Number: 10/16/2021, 2:11 PM  Clinical Narrative:        Pt oriented x2, CSW spoke with daughter     Learta Codding.  Pt lives with her two sons, no current services in home.  Liza agreeable to SNF plan, discussed choice and first choice is [facility], 2nd choice is [facility].  Permission given to send out referral in hub.           Expected Discharge Plan: Skilled Nursing Facility Barriers to Discharge: Continued Medical Work up, SNF Pending bed offer   Patient Goals and CMS Choice     Choice offered to / list presented to : Adult Children (daughter Learta Codding)  Expected Discharge Plan and Services Expected Discharge Plan: [place] In-house Referral: Clinical Social Work   Post Acute Care Choice: [place] Living arrangements for the past 2 months: [place]                                      Prior Living Arrangements/Services Living arrangements for the past 2 months: Single Family Home Lives with:: Adult Children (lives with 2 sons)          Need for Family Participation in Patient Care: Yes (Comment) Care giver support system in place?: Yes (comment) Current home services: Other (comment) (none) Criminal Activity/Legal Involvement Pertinent to Current Situation/Hospitalization: No - Comment as needed  Activities of Daily Living Home Assistive Devices/Equipment: None ADL Screening (condition at time of admission) Patient's cognitive ability adequate to safely complete daily activities?: Yes Is the patient deaf or have difficulty hearing?: Yes Does the patient have difficulty seeing, even when wearing glasses/contacts?: No Does the patient have difficulty concentrating, remembering, or making decisions?:  Yes Patient able to express need for assistance with ADLs?: Yes Does the patient have difficulty dressing or bathing?: Yes Independently performs ADLs?: No Communication: Independent Dressing (OT): Needs assistance Is this a change from baseline?: Pre-admission baseline Grooming: Needs assistance Is this a change from baseline?: Pre-admission baseline Feeding: Independent Bathing: Needs assistance Is this a change from baseline?: Pre-admission baseline Toileting: Needs assistance Is this a change from baseline?: Pre-admission baseline In/Out Bed: Needs assistance Is this a change from baseline?: Change from baseline, expected to last <3 days Walks in Home: Needs assistance Is this a change from baseline?: Pre-admission baseline Does the patient have difficulty walking or climbing stairs?: Yes Weakness of Legs: Both Weakness of Arms/Hands: Both  Permission Sought/Granted                  Emotional Assessment Appearance::  (phone assessment) Attitude/Demeanor/Rapport: Unable to Assess Affect (typically observed): Unable to Assess Orientation: : Oriented to Self, Oriented to Place      Admission diagnosis:  Dehydration [E86.0] UTI (urinary tract infection) [N39.0] General weakness [R53.1] Bacteriuria [R82.71] Altered mental status, unspecified altered mental status type [R41.82] Patient Active Problem List   Diagnosis Date Noted   UTI (urinary tract infection) 10/15/2021   Acute cystitis without hematuria    Stage 3a chronic kidney disease (CKD) ([place]) 02/13/2021   Dehydration 02/13/2021   Lactic acidosis 02/13/2021   Hypokalemia 69/79/4801   Metabolic alkalosis 65/53/7482   Acute metabolic encephalopathy  02/13/2021   Closed left hip fracture ([place]) 08/25/2020   Leukocytosis 08/25/2020   Hyperglycemia 08/25/2020   Elevated brain natriuretic peptide (BNP) level 08/25/2020   GERD (gastroesophageal reflux disease) 08/25/2020   Cerebrovascular accident (CVA) due to  stenosis of basilar artery ([place]) 07/17/2019   Dyspnea 07/09/2019   Chronic diastolic CHF (congestive heart failure) ([place]) 07/09/2019   Chest pain    Coronary artery calcification seen on CT scan    Gait abnormality 01/24/2019   Tremor 01/24/2019   Memory loss 01/24/2019   Chronic bilateral low back pain without sciatica 06/06/2018   At risk for falls 06/06/2018   Senile osteoporosis 06/06/2018   Atelectasis of left lung 04/06/2018   Acute encephalopathy 02/09/2018   ESBL (extended spectrum beta-lactamase) producing bacteria infection 02/02/2018   Atrial fibrillation, chronic ([place]) 02/02/2018   Constipation 01/31/2018   Urolithiasis 01/30/2018   T12 compression fracture ([place]) 09/04/2017   Fall at home, initial encounter 07/03/2017   Tachycardia 02/03/2017   Chronic pain syndrome 02/02/2017   Dizziness 12/15/2016   Inflammation of sacroiliac joint ([place]) 05/07/2015   Lumbar spondylosis with myelopathy 02/11/2014   Peripheral polyneuropathy 02/11/2014   Opioid dependence ([place]) 02/11/2014   Abnormal EKG 11/21/2013   Displacement of lumbar intervertebral disc without myelopathy 11/18/2013   Lumbar radiculopathy 11/18/2013   [name] (abdominal aortic aneurysm) ([place]) 09/30/2013   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[place]) 01/03/2013   OSA (obstructive sleep apnea) 06/29/2012   Dementia ([place]) 06/29/2012   Pre-diabetes 06/29/2012   Hypoxia, sleep related 06/29/2012   RA (rheumatoid arthritis) ([place]) 06/29/2012   Nonspecific abnormal results of cardiovascular function study 06/02/2011   Nonspecific abnormal electrocardiogram (ECG) (EKG) 05/18/2011   Osteoporosis 04/09/2008   Hyperlipidemia 05/12/2006   DEPENDENT EDEMA, LEGS, BILATERAL 05/12/2006   PERIPHERAL NEUROPATHY 03/11/2006   Essential hypertension 03/11/2006   PCP:  [name], MD Pharmacy:   [id number] [place], [place] - [address] [place]  [id number]: [phone]: [email or web address]: [phone]: [phone]    Social Determinants of Health (SDOH) Interventions    Readmission Risk Interventions    02/16/2021   10:54 AM  Readmission Risk Prevention Plan  Transportation Screening Complete  [place] or [place]  Social Work Consult for RTrain[place]Planning/Counseling Complete  Palliative Care Screening Not Applicable  Medication Review (Press photographer Complete

## 2021-10-17 DIAGNOSIS — N3000 Acute cystitis without hematuria: Secondary | ICD-10-CM | POA: Diagnosis not present

## 2021-10-17 LAB — CBC
HCT: 33.8 % — ABNORMAL LOW (ref 36.0–46.0)
Hemoglobin: 11 g/dL — ABNORMAL LOW (ref 12.0–15.0)
MCH: 29.1 pg (ref 26.0–34.0)
MCHC: 32.5 g/dL (ref 30.0–36.0)
MCV: 89.4 fL (ref 80.0–100.0)
Platelets: 156 10*3/uL (ref 150–400)
RBC: 3.78 MIL/uL — ABNORMAL LOW (ref 3.87–5.11)
RDW: 13.5 % (ref 11.5–15.5)
WBC: 8.8 10*3/uL (ref 4.0–10.5)
nRBC: 0 % (ref 0.0–0.2)

## 2021-10-17 LAB — BASIC METABOLIC PANEL
Anion gap: 7 (ref 5–15)
BUN: 21 mg/dL (ref 8–23)
CO2: 28 mmol/L (ref 22–32)
Calcium: 8.2 mg/dL — ABNORMAL LOW (ref 8.9–10.3)
Chloride: 103 mmol/L (ref 98–111)
Creatinine, Ser: 0.89 mg/dL (ref 0.44–1.00)
GFR, Estimated: >60 mL/min (ref >60)
Glucose, Bld: 88 mg/dL (ref 70–99)
Potassium: 3.8 mmol/L (ref 3.5–5.1)
Sodium: 138 mmol/L (ref 135–145)

## 2021-10-17 LAB — MAGNESIUM: Magnesium: 2.1 mg/dL (ref 1.7–2.4)

## 2021-10-17 MED ORDER — ENSURE ENLIVE PO LIQD
237.0000 mL | Freq: Two times a day (BID) | ORAL | Status: DC
  Administered 2021-10-17 – 2021-10-20 (×6): 237 mL via ORAL

## 2021-10-17 NOTE — Progress Notes (Signed)
PROGRESS NOTE    [name]  ZOX:[id number] DOB: 01-11-1934 DOA: 10/15/2021 PCP: Donita Brooks, MD   Brief Narrative:    [name] is a 88 y.o. female with medical history significant for chronic atrial fibrillation not on anticoagulation due to falls, mild dementia, prior UTI, chronic pain, dyslipidemia, chronic diastolic heart failure, stage IIIa chronic kidney disease, and GERD who was brought to the ED yesterday on account of some worsening weakness at home.  Patient was admitted with acute metabolic encephalopathy-multifactorial in the setting of UTI with history of dementia as well as chronic narcotic use.  Assessment & Plan:   Principal Problem:   UTI (urinary tract infection) Active Problems:   Dementia (HCC)   Chronic pain syndrome   Atrial fibrillation, chronic (HCC)   Leukocytosis   Stage 3a chronic kidney disease (CKD) (HCC)   Acute metabolic encephalopathy   Acute cystitis without hematuria  Assessment and Plan:   Acute metabolic encephalopathy-multifactorial -In the setting of Klebsiella UTI with noted history of dementia as well as chronic narcotic use -Continue treatment with Rocephin for UTI  -Monitor urine sensitivities which are pending -Monitor repeat labs   Chronic atrial fibrillation -Continue atenolol for rate control -Patient is not on anticoagulation due to history of falls   Chronic low back pain -Hold Cymbalta, gabapentin, and narcotics until mentation improves -Tylenol as needed for now   GERD -PPI   Dyslipidemia -Continue Zetia and statin   Debility/deconditioning -Has history of falls -Fall precaution -PT evaluation recommending SNF   CKD stage IIIa-stable -Near baseline creatinine levels, monitor   Chronic diastolic CHF -Currently appears compensated  -Continue atenolol, Crestor, and aspirin   Obesity -BMI 31.05 -Lifestyle changes outpatient  Poor oral intake -Dietitian consulted -Nutritional shakes  ordered    DVT prophylaxis:Lovenox Code Status: Full Family Communication: Discussed with son at bedside 6/18 Disposition Plan:  Status is: Inpatient Remains inpatient appropriate because: Ongoing confusion and need for IV medications.     Consultants:  None   Procedures:  None   Antimicrobials:  Anti-infectives (From admission, onward)    Start     Dose/Rate Route Frequency Ordered Stop   10/16/21 0600  cefTRIAXone (ROCEPHIN) 1 g in sodium chloride 0.9 % 100 mL IVPB        1 g 200 mL/hr over 30 Minutes Intravenous Every 24 hours 10/15/21 0754     10/15/21 0645  cefTRIAXone (ROCEPHIN) 1 g in sodium chloride 0.9 % 100 mL IVPB        1 g 200 mL/hr over 30 Minutes Intravenous  Once 10/15/21 8119 10/15/21 0721      Subjective: Patient seen and evaluated today with no new acute complaints or concerns. No acute concerns or events noted overnight.  She has not been eating very well and denies much of an appetite.  Objective: Vitals:   10/16/21 1900 10/17/21 0420 10/17/21 0736 10/17/21 0940  BP: (!) 128/94 118/79 109/71 (!) 132/93  Pulse: 83 (!) 101 (!) 101 (!) 108  Resp: 19 20 20 20   Temp: 98.7 F (37.1 C) 98 F (36.7 C) 98.2 F (36.8 C) 98.7 F (37.1 C)  TempSrc: Oral Oral Oral Oral  SpO2: 97% 98% 93% 93%  Weight:      Height:        Intake/Output Summary (Last 24 hours) at 10/17/2021 1024 Last data filed at 10/17/2021 0900 Gross per 24 hour  Intake 270 ml  Output 600 ml  Net -330 ml  Filed Weights   10/15/21 0359  Weight: 77 kg    Examination:  General exam: Appears calm and comfortable  Respiratory system: Clear to auscultation. Respiratory effort normal. Cardiovascular system: S1 & S2 heard, RRR.  Gastrointestinal system: Abdomen is soft Central nervous system: Alert and awake Extremities: No edema Skin: No significant lesions noted Psychiatry: Flat affect.    Data Reviewed: I have personally reviewed following labs and imaging  studies  CBC: Recent Labs  Lab 10/14/21 1847 10/15/21 0438 10/16/21 0528 10/17/21 0607  WBC 10.0 15.9* 9.5 8.8  NEUTROABS 7.8* 14.1*  --   --   HGB 13.0 11.8* 11.0* 11.0*  HCT 41.0 37.2 34.3* 33.8*  MCV 91.3 90.3 90.7 89.4  PLT 158 160 136* 156   Basic Metabolic Panel: Recent Labs  Lab 10/14/21 1847 10/15/21 0438 10/16/21 0528 10/17/21 0607  NA 134* 132* 135 138  K 4.4 3.9 3.7 3.8  CL 96* 95* 99 103  CO2 29 27 28 28   GLUCOSE 107* 133* 103* 88  BUN 19 21 22 21   CREATININE 1.05* 1.04* 0.90 0.89  CALCIUM 8.9 8.7* 8.5* 8.2*  MG  --   --  1.6* 2.1   GFR: Estimated Creatinine Clearance: 44.4 mL/min (by C-G formula based on SCr of 0.89 mg/dL). Liver Function Tests: Recent Labs  Lab 10/14/21 1847 10/15/21 0438  AST 18 19  ALT 12 13  ALKPHOS 63 55  BILITOT 1.3* 1.6*  PROT 7.1 6.7  ALBUMIN 3.6 3.2*   No results for input(s): "LIPASE", "AMYLASE" in the last 168 hours. No results for input(s): "AMMONIA" in the last 168 hours. Coagulation Profile: No results for input(s): "INR", "PROTIME" in the last 168 hours. Cardiac Enzymes: No results for input(s): "CKTOTAL", "CKMB", "CKMBINDEX", "TROPONINI" in the last 168 hours. BNP (last 3 results) No results for input(s): "PROBNP" in the last 8760 hours. HbA1C: No results for input(s): "HGBA1C" in the last 72 hours. CBG: No results for input(s): "GLUCAP" in the last 168 hours. Lipid Profile: No results for input(s): "CHOL", "HDL", "LDLCALC", "TRIG", "CHOLHDL", "LDLDIRECT" in the last 72 hours. Thyroid Function Tests: Recent Labs    10/15/21 0438  TSH 1.452   Anemia Panel: No results for input(s): "VITAMINB12", "FOLATE", "FERRITIN", "TIBC", "IRON", "RETICCTPCT" in the last 72 hours. Sepsis Labs: No results for input(s): "PROCALCITON", "LATICACIDVEN" in the last 168 hours.  Recent Results (from the past 240 hour(s))  Urine Culture     Status: Abnormal (Preliminary result)   Collection Time: 10/15/21  6:33 AM    Specimen: Urine, Clean Catch  Result Value Ref Range Status   Specimen Description   Final    URINE, CLEAN CATCH Performed at [facility], [address], [place], [place] [address]    Special Requests   Final    NONE Performed at [facility], [address], [place], [place] [address]    Culture (A)  Final    >=100,000 COLONIES/mL KLEBSIELLA PNEUMONIAE SUSCEPTIBILITIES TO FOLLOW Performed at [facility], [address], [place], [place] [address]    Report Status PENDING  Incomplete         Radiology Studies: No results found.      Scheduled Meds:  acetaminophen  650 mg Oral Q6H   aspirin EC  81 mg Oral Daily   atenolol  12.5 mg Oral Daily   calcium-vitamin D  1 tablet Oral Daily   diclofenac sodium  2 g Topical QID   ezetimibe  10 mg Oral Daily  feeding supplement  237 mL Oral BID BM   ketoconazole  1 Application Topical Daily   pantoprazole  40 mg Oral Daily   rosuvastatin  40 mg Oral Daily   Continuous Infusions:  cefTRIAXone (ROCEPHIN)  IV 1 g (10/17/21 0527)     LOS: 2 days    Time spent: 35 minutes    Deaunte Dente Hoover Brunette, DO [facility]  If 7PM-7AM, please contact night-coverage [email or web address] 10/17/2021, 10:24 AM

## 2021-10-17 NOTE — Progress Notes (Signed)
Pt has been asleep most of am. Awakens to name called, oriented to person only. Only complaint is, "I'm cold." Pt refused breakfast this am, states, "I ain't got no appetite." Pt did drink ensure that was given, but only did so with this nurse holding the bottle and reminding her to drink. Pt assists to turn self left to right, denies any c/o pain at this time.

## 2021-10-18 DIAGNOSIS — N3000 Acute cystitis without hematuria: Secondary | ICD-10-CM | POA: Diagnosis not present

## 2021-10-18 LAB — BASIC METABOLIC PANEL
Anion gap: 10 (ref 5–15)
BUN: 16 mg/dL (ref 8–23)
CO2: 24 mmol/L (ref 22–32)
Calcium: 8.3 mg/dL — ABNORMAL LOW (ref 8.9–10.3)
Chloride: 102 mmol/L (ref 98–111)
Creatinine, Ser: 0.8 mg/dL (ref 0.44–1.00)
GFR, Estimated: >60 mL/min (ref >60)
Glucose, Bld: 92 mg/dL (ref 70–99)
Potassium: 3.8 mmol/L (ref 3.5–5.1)
Sodium: 136 mmol/L (ref 135–145)

## 2021-10-18 LAB — URINE CULTURE: Culture: >=100000 — AB

## 2021-10-18 LAB — MAGNESIUM: Magnesium: 1.9 mg/dL (ref 1.7–2.4)

## 2021-10-18 MED ORDER — MIRTAZAPINE 15 MG PO TABS
15.0000 mg | ORAL_TABLET | Freq: Every day | ORAL | Status: DC
  Administered 2021-10-18 – 2021-10-19 (×2): 15 mg via ORAL
  Filled 2021-10-18 (×2): qty 1

## 2021-10-18 MED ORDER — CEPHALEXIN 500 MG PO CAPS
500.0000 mg | ORAL_CAPSULE | Freq: Three times a day (TID) | ORAL | Status: AC
  Administered 2021-10-19 (×3): 500 mg via ORAL
  Filled 2021-10-18 (×3): qty 1

## 2021-10-18 MED ORDER — ADULT MULTIVITAMIN W/MINERALS CH
1.0000 | ORAL_TABLET | Freq: Every day | ORAL | Status: DC
Start: 2021-10-18 — End: 2021-10-20
  Administered 2021-10-18 – 2021-10-20 (×3): 1 via ORAL
  Filled 2021-10-18 (×3): qty 1

## 2021-10-18 NOTE — Progress Notes (Signed)
Physical Therapy Treatment Patient Details Name: [name] MRN: [id number] DOB: 1933/10/07 Today's Date: 10/18/2021   History of Present Illness [name] is a 88 y.o. female with medical history significant for chronic atrial fibrillation not on anticoagulation due to falls, mild dementia, prior UTI, chronic pain, dyslipidemia, chronic diastolic heart failure, stage IIIa chronic kidney disease, and GERD who was brought to the ED yesterday on account of some worsening weakness at home.  She was also noted to have some mild confusion, but work-up was noted to be negative and therefore patient was sent home.  She returned to the ED earlier this morning with worsening weakness and confusion and patient states that she was very prone to falling and unable to ambulate with her walker.  She has also had poor oral intake over the last 2 days, no fever, vomiting, diarrhea, chills, or falls.  She has had prior history of Klebsiella UTI which was sensitive to Rocephin.    PT Comments    Nursing reports patient transferred bed to chair with assistance but was unable to tolerate sitting in chair for one hour this morning. Patient initially declined to work with therapist today. Patient agreeable to bed mobility supine to sit with min guard to min assist and use of bedrails for sitting at edge of bed for 2 therapeutic exercises. Patient complained of back pain in sitting position. Patient refused transfer out of bed to chair and/or short distance ambulation in room with RW. Patient then performed sit to supine transfer with min guard to min assist and use of bed rails. Patient agreeable to performing bed level exercises for AROM, AAROM and strengthening of lower extremities.  Patient would continue to benefit from skilled physical therapy in current environment and next venue to continue return to prior function and increase strength, endurance, balance, coordination, and functional mobility and gait  skills.    Recommendations for follow up therapy are one component of a multi-disciplinary discharge planning process, led by the attending physician.  Recommendations may be updated based on patient status, additional functional criteria and insurance authorization.  Follow Up Recommendations  Skilled nursing-short term rehab (<3 hours/day)     Assistance Recommended at Discharge Set up Supervision/Assistance  Patient can return home with the following A lot of help with walking and/or transfers;Help with stairs or ramp for entrance;Assistance with cooking/housework;A lot of help with bathing/dressing/bathroom   Equipment Recommendations  None recommended by PT    Recommendations for Other Services       Precautions / Restrictions Precautions Precautions: Fall Restrictions Weight Bearing Restrictions: No     Mobility  Bed Mobility Overal bed mobility: Needs Assistance Bed Mobility: Supine to Sit, Sit to Supine     Supine to sit: HOB elevated, Min guard, Min assist Sit to supine: Min guard, HOB elevated   General bed mobility comments: slow, labored movement and heavy use of bed rails for trunk    Transfers   General transfer comment: patient refused    Ambulation/Gait     General Gait Details: patient refused   Stairs       Wheelchair Mobility    Modified Rankin (Stroke Patients Only)       Balance Overall balance assessment: Needs assistance Sitting-balance support: Bilateral upper extremity supported, Feet unsupported Sitting balance-Leahy Scale: Fair Sitting balance - Comments: Able to sit EOB with UE support but struggles to maintain midline          Cognition Arousal/Alertness: Awake/alert Behavior During Therapy:  WFL for tasks assessed/performed Overall Cognitive Status: Within Functional Limits for tasks assessed        Exercises General Exercises - Upper Extremity Shoulder Flexion: AROM, Strengthening, Both, 10 reps,  Seated General Exercises - Lower Extremity Short Arc Quad: AROM, Strengthening, Both, 10 reps, Supine Heel Slides: AROM, Strengthening, Both, 10 reps, Supine Hip ABduction/ADduction: AAROM, Strengthening, Both, 10 reps, Supine Hip Flexion/Marching: AROM, Strengthening, Both, 10 reps, Seated Toe Raises: AROM, Strengthening, Both, 10 reps, Supine Heel Raises: AROM, Strengthening, Both, 10 reps, Supine    General Comments        Pertinent Vitals/Pain Pain Assessment Pain Assessment: Faces Faces Pain Scale: Hurts little more Pain Location: low back Pain Descriptors / Indicators: Aching, Dull, Grimacing Pain Intervention(s): Limited activity within patient's tolerance, Monitored during session, Repositioned    Home Living         Prior Function            PT Goals (current goals can now be found in the care plan section) Acute Rehab PT Goals Patient Stated Goal: return home PT Goal Formulation: With patient Time For Goal Achievement: 10/29/21 Progress towards PT goals: Progressing toward goals    Frequency    Min 3X/week      PT Plan Current plan remains appropriate       AM-PAC PT "6 Clicks" Mobility   Outcome Measure  Help needed turning from your back to your side while in a flat bed without using bedrails?: A Little Help needed moving from lying on your back to sitting on the side of a flat bed without using bedrails?: A Little Help needed moving to and from a bed to a chair (including a wheelchair)?: A Lot Help needed standing up from a chair using your arms (e.g., wheelchair or bedside chair)?: A Lot Help needed to walk in hospital room?: A Lot Help needed climbing 3-5 steps with a railing? : Total 6 Click Score: 13    End of Session   Activity Tolerance: Patient tolerated treatment well;Patient limited by fatigue Patient left: with call bell/phone within reach;in bed;with bed alarm set Nurse Communication: Mobility status PT Visit Diagnosis:  Unsteadiness on feet (R26.81);Other abnormalities of gait and mobility (R26.89);Muscle weakness (generalized) (M62.81)     Time: 1610-9604 PT Time Calculation (min) (ACUTE ONLY): 23 min  Charges:  $Therapeutic Exercise: 8-22 mins $Therapeutic Activity: 8-22 mins                     Floria Raveling. Hartnett-Rands, MS, PT Per [place] [phone]  Pamala Hurry  Hartnett-Rands 10/18/2021, 2:59 PM

## 2021-10-18 NOTE — Progress Notes (Signed)
PROGRESS NOTE    [name]  ZOX:[id number] DOB: [birth year] DOA: 10/15/2021 PCP: Donita Brooks, MD   Brief Narrative:   [name] is a 88 y.o. female with medical history significant for chronic atrial fibrillation not on anticoagulation due to falls, mild dementia, prior UTI, chronic pain, dyslipidemia, chronic diastolic heart failure, stage IIIa chronic kidney disease, and GERD who was brought to the ED yesterday on account of some worsening weakness at home.  Patient was admitted with acute metabolic encephalopathy-multifactorial in the setting of UTI with history of dementia as well as chronic narcotic use.  Assessment & Plan:   Principal Problem:   UTI (urinary tract infection) Active Problems:   Dementia (HCC)   Chronic pain syndrome   Atrial fibrillation, chronic (HCC)   Leukocytosis   Stage 3a chronic kidney disease (CKD) (HCC)   Acute metabolic encephalopathy   Acute cystitis without hematuria  Assessment and Plan:   Acute metabolic encephalopathy-multifactorial -In the setting of Klebsiella UTI with noted history of dementia as well as chronic narcotic use -Continue treatment now with Keflex -Monitor urine sensitivities which are pending -Monitor repeat labs   Chronic atrial fibrillation -Continue atenolol for rate control -Patient is not on anticoagulation due to history of falls   Chronic low back pain -Hold Cymbalta, gabapentin, and narcotics until mentation improves -Tylenol as needed for now   GERD -PPI   Dyslipidemia -Continue Zetia and statin   Debility/deconditioning -Has history of falls -Fall precaution -PT evaluation recommending SNF   CKD stage IIIa-stable -Near baseline creatinine levels, monitor   Chronic diastolic CHF -Currently appears compensated  -Continue atenolol, Crestor, and aspirin   Obesity -BMI 31.05 -Lifestyle changes outpatient   Poor oral intake -Dietitian consulted -Nutritional shakes  ordered -Start Remeron QHS on 6/19    DVT prophylaxis:Lovenox Code Status: Full Family Communication: Discussed with son at bedside 6/18 Disposition Plan:  Status is: Inpatient Remains inpatient appropriate because: Ongoing confusion and need for IV medications.     Consultants:  None   Procedures:  None   Antimicrobials:  Anti-infectives (From admission, onward)    Start     Dose/Rate Route Frequency Ordered Stop   10/19/21 0600  cephALEXin (KEFLEX) capsule 500 mg        500 mg Oral Every 8 hours 10/18/21 1036     10/16/21 0600  cefTRIAXone (ROCEPHIN) 1 g in sodium chloride 0.9 % 100 mL IVPB  Status:  Discontinued        1 g 200 mL/hr over 30 Minutes Intravenous Every 24 hours 10/15/21 0754 10/18/21 1036   10/15/21 0645  cefTRIAXone (ROCEPHIN) 1 g in sodium chloride 0.9 % 100 mL IVPB        1 g 200 mL/hr over 30 Minutes Intravenous  Once 10/15/21 8119 10/15/21 0721      Subjective: Patient seen and evaluated today with no new acute complaints or concerns. No acute concerns or events noted overnight. She continues not to have an appetite and is not eating much.  Objective: Vitals:   10/17/21 2048 10/18/21 0353 10/18/21 0355 10/18/21 1301  BP: (!) 145/87 (!) 141/76  (!) 137/95  Pulse: (!) 102 98 98 91  Resp: 18 19  18   Temp: 98.1 F (36.7 C) 98.6 F (37 C)  98.2 F (36.8 C)  TempSrc:  Oral  Oral  SpO2: 92% (!) 89% 93% 94%  Weight:      Height:        Intake/Output Summary (Last  24 hours) at 10/18/2021 1511 Last data filed at 10/18/2021 1300 Gross per 24 hour  Intake 360 ml  Output 250 ml  Net 110 ml   Filed Weights   10/15/21 0359  Weight: 77 kg    Examination:  General exam: Appears calm and comfortable  Respiratory system: Clear to auscultation. Respiratory effort normal. Cardiovascular system: S1 & S2 heard, RRR.  Gastrointestinal system: Abdomen is soft Central nervous system: Alert and awake Extremities: No edema Skin: No significant lesions  noted Psychiatry: Flat affect.    Data Reviewed: I have personally reviewed following labs and imaging studies  CBC: Recent Labs  Lab 10/14/21 1847 10/15/21 0438 10/16/21 0528 10/17/21 0607  WBC 10.0 15.9* 9.5 8.8  NEUTROABS 7.8* 14.1*  --   --   HGB 13.0 11.8* 11.0* 11.0*  HCT 41.0 37.2 34.3* 33.8*  MCV 91.3 90.3 90.7 89.4  PLT 158 160 136* 156   Basic Metabolic Panel: Recent Labs  Lab 10/14/21 1847 10/15/21 0438 10/16/21 0528 10/17/21 0607 10/18/21 0441  NA 134* 132* 135 138 136  K 4.4 3.9 3.7 3.8 3.8  CL 96* 95* 99 103 102  CO2 29 27 28 28 24   GLUCOSE 107* 133* 103* 88 92  BUN 19 21 22 21 16   CREATININE 1.05* 1.04* 0.90 0.89 0.80  CALCIUM 8.9 8.7* 8.5* 8.2* 8.3*  MG  --   --  1.6* 2.1 1.9   GFR: Estimated Creatinine Clearance: 49.4 mL/min (by C-G formula based on SCr of 0.8 mg/dL). Liver Function Tests: Recent Labs  Lab 10/14/21 1847 10/15/21 0438  AST 18 19  ALT 12 13  ALKPHOS 63 55  BILITOT 1.3* 1.6*  PROT 7.1 6.7  ALBUMIN 3.6 3.2*   No results for input(s): "LIPASE", "AMYLASE" in the last 168 hours. No results for input(s): "AMMONIA" in the last 168 hours. Coagulation Profile: No results for input(s): "INR", "PROTIME" in the last 168 hours. Cardiac Enzymes: No results for input(s): "CKTOTAL", "CKMB", "CKMBINDEX", "TROPONINI" in the last 168 hours. BNP (last 3 results) No results for input(s): "PROBNP" in the last 8760 hours. HbA1C: No results for input(s): "HGBA1C" in the last 72 hours. CBG: No results for input(s): "GLUCAP" in the last 168 hours. Lipid Profile: No results for input(s): "CHOL", "HDL", "LDLCALC", "TRIG", "CHOLHDL", "LDLDIRECT" in the last 72 hours. Thyroid Function Tests: No results for input(s): "TSH", "T4TOTAL", "FREET4", "T3FREE", "THYROIDAB" in the last 72 hours. Anemia Panel: No results for input(s): "VITAMINB12", "FOLATE", "FERRITIN", "TIBC", "IRON", "RETICCTPCT" in the last 72 hours. Sepsis Labs: No results for  input(s): "PROCALCITON", "LATICACIDVEN" in the last 168 hours.  Recent Results (from the past 240 hour(s))  Urine Culture     Status: Abnormal   Collection Time: 10/15/21  6:33 AM   Specimen: Urine, Clean Catch  Result Value Ref Range Status   Specimen Description   Final    URINE, CLEAN CATCH Performed at [facility], [address], [place], [place] [address]    Special Requests   Final    NONE Performed at [facility], [address], [place], [place] [address]    Culture >=100,000 COLONIES/mL KLEBSIELLA PNEUMONIAE (A)  Final   Report Status 10/18/2021 FINAL  Final   Organism ID, Bacteria KLEBSIELLA PNEUMONIAE (A)  Final      Susceptibility   Klebsiella pneumoniae - MIC*    AMPICILLIN RESISTANT Resistant     CEFAZOLIN <=4 SENSITIVE Sensitive     CEFEPIME <=0.12 SENSITIVE Sensitive     CEFTRIAXONE <=  0.25 SENSITIVE Sensitive     CIPROFLOXACIN <=0.25 SENSITIVE Sensitive     GENTAMICIN <=1 SENSITIVE Sensitive     IMIPENEM <=0.25 SENSITIVE Sensitive     NITROFURANTOIN 64 INTERMEDIATE Intermediate     TRIMETH/SULFA <=20 SENSITIVE Sensitive     AMPICILLIN/SULBACTAM 4 SENSITIVE Sensitive     PIP/TAZO <=4 SENSITIVE Sensitive     * >=100,000 COLONIES/mL KLEBSIELLA PNEUMONIAE         Radiology Studies: No results found.      Scheduled Meds:  acetaminophen  650 mg Oral Q6H   aspirin EC  81 mg Oral Daily   atenolol  12.5 mg Oral Daily   calcium-vitamin D  1 tablet Oral Daily   [START ON 10/19/2021] cephALEXin  500 mg Oral Q8H   diclofenac sodium  2 g Topical QID   ezetimibe  10 mg Oral Daily   feeding supplement  237 mL Oral BID BM   ketoconazole  1 Application Topical Daily   mirtazapine  15 mg Oral QHS   pantoprazole  40 mg Oral Daily   rosuvastatin  40 mg Oral Daily     LOS: 3 days    Time spent: 35 minutes    Guenther Dunshee Hoover Brunette, DO [facility]  If 7PM-7AM, please contact night-coverage [email or web address] 10/18/2021, 3:11 PM

## 2021-10-18 NOTE — TOC Progression Note (Signed)
Transition of Care [facility]) - Progression Note    Patient Details  Name: Taliya Mcclard MRN: [id number] Date of Birth: 02-11-1934  Transition of Care [facility]) CM/SW Contact  Shade Flood, LCSW Phone Number: 10/18/2021, 11:48 AM  Clinical Narrative:     TOC following. Spoke with pt's daughter, Learta Codding, to review bed offers. She/pt select [facility]. Insurance authorization started. Per MD, pt medically stable for dc if authorization received. Will follow.  Expected Discharge Plan: [place] Barriers to Discharge: Continued Medical Work up, SNF Pending bed offer  Expected Discharge Plan and Services Expected Discharge Plan: [place] In-house Referral: Clinical Social Work   Post Acute Care Choice: [place] arrangements for the past 2 months: [place] Determinants of Health (SDOH) Interventions    Readmission Risk Interventions    02/16/2021   10:54 AM  Readmission Risk Prevention Plan  Transportation Screening Complete  HRI or Home Care Consult Complete  Social Work Consult for [place] Planning/Counseling Complete  Palliative Care Screening Not Applicable  Medication Review [employer]) Complete

## 2021-10-18 NOTE — Progress Notes (Signed)
Initial Nutrition Assessment  DOCUMENTATION CODES:   Obesity unspecified  INTERVENTION:  Liberalize diet from a heart healthy to a regular diet with chopped meats to provide widest variety of menu options to enhance nutritional adequacy Ensure Enlive po BID, each supplement provides 350 kcal and 20 grams of protein. Magic cup TID with meals, each supplement provides 290 kcal and 9 grams of protein MVI with minerals daily  NUTRITION DIAGNOSIS:   Inadequate oral intake related to poor appetite as evidenced by per patient/family report, meal completion < 50%.  GOAL:   Patient will meet greater than or equal to 90% of their needs  MONITOR:   PO intake, Supplement acceptance, Diet advancement, Labs, Weight trends  REASON FOR ASSESSMENT:   Consult Poor PO  ASSESSMENT:   Pt admitted from home with weakness and confusion secondary to UTI. PMH significant for chronic afib, mild dementia, prior UTI, chronic pain, dyslipidemia, CHF, CKD stage IIIa, and GERD.  Pt noted to be medically stable and pending d/c to SNF.   Pt resting in bed with no family present at bedside. She states that she lives at home with her sons. She endorses having a poor appetite for a while and states that food just makes her nauseous. Pt reports drinking ~2 Ensure but states that she does not usually finish them. Her mobility has been limited and she reports that she is unable to stand by herself.   Meal completions: 06/15: 0%- breakfast and lunch, 5%-dinner 06/19: 0%-breakfast and lunch  Pt states that she is unsure of her wt but that her clothes seemed to begin to fit more loosely PTA. Reviewed pt's wt hx. It appears within the last year, she has had a 9.7% wt loss. Will continue to monitor wt trends.   Edema: mild pitting BLE  Medications: calcium-vitamin D, protonix  Labs reviewed  NUTRITION - FOCUSED PHYSICAL EXAM:  Flowsheet Row Most Recent Value  Orbital Region No depletion  Upper Arm Region No  depletion  Thoracic and Lumbar Region No depletion  Buccal Region Mild depletion  Temple Region Mild depletion  Clavicle Bone Region No depletion  Clavicle and Acromion Bone Region No depletion  Scapular Bone Region No depletion  Dorsal Hand Mild depletion  Patellar Region No depletion  Anterior Thigh Region No depletion  Posterior Calf Region No depletion  Edema (RD Assessment) Mild  [BLE]  Hair Reviewed  Eyes Reviewed  Mouth Other (Comment)  [no dentition]  Skin Reviewed  Nails Reviewed       Diet Order:   Diet Order             Diet Heart Room service appropriate? Yes; Fluid consistency: Thin  Diet effective now                  EDUCATION NEEDS:   No education needs have been identified at this time  Skin:  Skin Assessment: Reviewed RN Assessment  Last BM:  6/18  (type 6, type 5)  Height:   Ht Readings from Last 1 Encounters:  10/15/21 '5\' 2"'$  (1.575 m)    Weight:   Wt Readings from Last 1 Encounters:  10/15/21 77 kg    Ideal Body Weight:  50 kg  BMI:  Body mass index is 31.05 kg/m.  Estimated Nutritional Needs:   Kcal:  1500-1700  Protein:  70-85g  Fluid:  >/=1.5L  Clayborne Dana, RDN, LDN Clinical Nutrition

## 2021-10-19 DIAGNOSIS — N3000 Acute cystitis without hematuria: Secondary | ICD-10-CM | POA: Diagnosis not present

## 2021-10-19 MED ORDER — NYSTATIN 100000 UNIT/GM EX POWD
Freq: Three times a day (TID) | CUTANEOUS | Status: DC
Start: 2021-10-19 — End: 2021-10-20
  Filled 2021-10-19: qty 15

## 2021-10-19 MED ORDER — DEXTROSE-NACL 5-0.9 % IV SOLN: INTRAVENOUS | Status: DC

## 2021-10-19 NOTE — Progress Notes (Signed)
Physical Therapy Treatment Patient Details Name: [name] MRN: [id number] DOB: 25-Sep-1933 Today's Date: 10/19/2021   History of Present Illness [name] is a 88 y.o. female with medical history significant for chronic atrial fibrillation not on anticoagulation due to falls, mild dementia, prior UTI, chronic pain, dyslipidemia, chronic diastolic heart failure, stage IIIa chronic kidney disease, and GERD who was brought to the ED yesterday on account of some worsening weakness at home.  She was also noted to have some mild confusion, but work-up was noted to be negative and therefore patient was sent home.  She returned to the ED earlier this morning with worsening weakness and confusion and patient states that she was very prone to falling and unable to ambulate with her walker.  She has also had poor oral intake over the last 2 days, no fever, vomiting, diarrhea, chills, or falls.  She has had prior history of Klebsiella UTI which was sensitive to Rocephin.    PT Comments    Patient presents in bed and agreeable for therapy.  Patient demonstrates slow labored movement for sitting up at bedside with poor carryover for propping up on elbows due to weakness, had to pull patient to sitting with Min assist, fair/good return for completing BLE ROM/strengthening exercises while seated at bedside with occasional rest breaks due to fatigue, tolerated 2 trials of stepping forward/backwards at bedside using RW and limited mostly due to c/o fatigue/generalized weakness.  Patient tolerated sitting up in chair after therapy - nursing staff notified.  Patient will benefit from continued skilled physical therapy in hospital and recommended venue below to increase strength, balance, endurance for safe ADLs and gait.       Recommendations for follow up therapy are one component of a multi-disciplinary discharge planning process, led by the attending physician.  Recommendations may be updated based on  patient status, additional functional criteria and insurance authorization.  Follow Up Recommendations  Skilled nursing-short term rehab (<3 hours/day)     Assistance Recommended at Discharge Set up Supervision/Assistance  Patient can return home with the following A lot of help with walking and/or transfers;Help with stairs or ramp for entrance;Assistance with cooking/housework;A lot of help with bathing/dressing/bathroom   Equipment Recommendations  None recommended by PT    Recommendations for Other Services       Precautions / Restrictions Precautions Precautions: Fall Restrictions Weight Bearing Restrictions: No     Mobility  Bed Mobility Overal bed mobility: Needs Assistance Bed Mobility: Supine to Sit     Supine to sit: Min assist     General bed mobility comments: slow labored movement with HOB flat    Transfers Overall transfer level: Needs assistance Equipment used: Rolling walker (2 wheels) Transfers: Sit to/from Stand, Bed to chair/wheelchair/BSC Sit to Stand: Min assist   Step pivot transfers: Min assist, Mod assist       General transfer comment: slow labored movement    Ambulation/Gait Ambulation/Gait assistance: Mod assist Gait Distance (Feet): 8 Feet Assistive device: Rolling walker (2 wheels) Gait Pattern/deviations: Decreased step length - right, Decreased step length - left, Decreased stride length, Trunk flexed Gait velocity: decreased     General Gait Details: slow labored unsteady move with slightly increased endurance/distance for taking side steps and steps forward/backwards at bedside without loss of balance, limited mostly due to fatigue   Stairs             Wheelchair Mobility    Modified Rankin (Stroke Patients Only)  Balance Overall balance assessment: Needs assistance Sitting-balance support: Feet supported, No upper extremity supported Sitting balance-Leahy Scale: Fair Sitting balance - Comments:  fair/good seated at EOB   Standing balance support: During functional activity, Bilateral upper extremity supported Standing balance-Leahy Scale: Fair Standing balance comment: using RW                            Cognition Arousal/Alertness: Awake/alert Behavior During Therapy: WFL for tasks assessed/performed Overall Cognitive Status: Within Functional Limits for tasks assessed                                          Exercises General Exercises - Lower Extremity Long Arc Quad: Seated, AROM, Strengthening, Both, 10 reps Hip Flexion/Marching: Seated, AROM, Strengthening, Both, 10 reps Toe Raises: Seated, AROM, Strengthening, Both, 10 reps Heel Raises: Seated, AROM, Strengthening, Both, 10 reps    General Comments        Pertinent Vitals/Pain Pain Assessment Pain Assessment: No/denies pain    Home Living                          Prior Function            PT Goals (current goals can now be found in the care plan section) Acute Rehab PT Goals Patient Stated Goal: return home after rehab PT Goal Formulation: With patient Time For Goal Achievement: 10/29/21 Progress towards PT goals: Progressing toward goals    Frequency    Min 3X/week      PT Plan Current plan remains appropriate    Co-evaluation              AM-PAC PT "6 Clicks" Mobility   Outcome Measure  Help needed turning from your back to your side while in a flat bed without using bedrails?: A Little Help needed moving from lying on your back to sitting on the side of a flat bed without using bedrails?: A Little Help needed moving to and from a bed to a chair (including a wheelchair)?: A Lot Help needed standing up from a chair using your arms (e.g., wheelchair or bedside chair)?: A Lot Help needed to walk in hospital room?: A Lot Help needed climbing 3-5 steps with a railing? : Total 6 Click Score: 13    End of Session   Activity Tolerance: Patient  tolerated treatment well;Patient limited by fatigue Patient left: in chair Nurse Communication: Mobility status PT Visit Diagnosis: Unsteadiness on feet (R26.81);Other abnormalities of gait and mobility (R26.89);Muscle weakness (generalized) (M62.81)     Time: 3893-7342 PT Time Calculation (min) (ACUTE ONLY): 20 min  Charges:  $Therapeutic Exercise: 8-22 mins $Therapeutic Activity: 8-22 mins                     11:04 AM, 10/19/21 Lonell Grandchild, MPT Physical Therapist with [facility] [phone] office [phone] mobile phone

## 2021-10-19 NOTE — TOC Progression Note (Signed)
Transition of Care [facility]) - Progression Note    Patient Details  Name: Naziya Hegwood MRN: [id number] Date of Birth: 30-Sep-1933  [facility] of [facility]) CM/SW Contact  Salome Arnt, [place] Phone Number: 10/19/2021, 11:08 AM  Clinical Narrative: Everlene Balls requesting updated PT note as pt refused out of bed activity yesterday. PT notified and CMA sending today's note. [place] updated. TOC will continue to follow.       Expected Discharge Plan: Skilled Nursing Facility Barriers to Discharge: Insurance Authorization  Expected Discharge Plan and Services Expected Discharge Plan: [place] In-house Referral: Clinical Social Work   Post Acute Care Choice: [place] Living arrangements for the past 2 months: [place] Determinants of Health (SDOH) Interventions    Readmission Risk Interventions    02/16/2021   10:54 AM  Readmission Risk Prevention Plan  Transportation Screening Complete  HRI or Home Care Consult Complete  Social Work Consult for [place] Planning/Counseling Complete  Palliative Care Screening Not Applicable  Medication Review Press photographer) Complete

## 2021-10-19 NOTE — Progress Notes (Signed)
PROGRESS NOTE    [name]  QMV:[id number] DOB: 1933-07-15 DOA: 10/15/2021 PCP: Donita Brooks, MD   Brief Narrative:    [name] is a 88 y.o. female with medical history significant for chronic atrial fibrillation not on anticoagulation due to falls, mild dementia, prior UTI, chronic pain, dyslipidemia, chronic diastolic heart failure, stage IIIa chronic kidney disease, and GERD who was brought to the ED yesterday on account of some worsening weakness at home.  Patient was admitted with acute metabolic encephalopathy-multifactorial in the setting of UTI with history of dementia as well as chronic narcotic use.  She is noted to have Klebsiella UTI and is on Keflex for treatment.  She unfortunately is having very poor p.o. intake and is only taking occasional supplements and has been started on IV fluid.  SNF placement pending.  Assessment & Plan:   Principal Problem:   UTI (urinary tract infection) Active Problems:   Dementia (HCC)   Chronic pain syndrome   Atrial fibrillation, chronic (HCC)   Leukocytosis   Stage 3a chronic kidney disease (CKD) (HCC)   Acute metabolic encephalopathy   Acute cystitis without hematuria  Assessment and Plan:   Acute metabolic encephalopathy-multifactorial -In the setting of Klebsiella UTI with noted history of dementia as well as chronic narcotic use -Continue treatment now with Keflex, will DC by tomorrow after 5 day course -Monitor urine sensitivities which are pending -Monitor repeat labs   Chronic atrial fibrillation -Continue atenolol for rate control -Patient is not on anticoagulation due to history of falls   Chronic low back pain -Hold Cymbalta, gabapentin, and narcotics until mentation improves -Tylenol as needed for now   GERD -PPI   Dyslipidemia -Continue Zetia and statin   Debility/deconditioning -Has history of falls -Fall precaution -PT evaluation recommending SNF with insurance authorization pending    CKD stage IIIa-stable -Near baseline creatinine levels, monitor   Chronic diastolic CHF -Currently appears compensated  -Continue atenolol, Crestor, and aspirin   Obesity -BMI 31.05 -Lifestyle changes outpatient   Poor oral intake/anorexia -Dietitian consulted -Nutritional shakes ordered -Start Remeron QHS on 6/19 -Start D5 IV fluid 6/20    DVT prophylaxis:Lovenox Code Status: Full Family Communication: Discussed with son at bedside 6/18, tried calling 6/20 with no response Disposition Plan:  Status is: Inpatient Remains inpatient appropriate because: Ongoing confusion and need for IV medications.     Consultants:  None   Procedures:  None   Antimicrobials:  Anti-infectives (From admission, onward)    Start     Dose/Rate Route Frequency Ordered Stop   10/19/21 0600  cephALEXin (KEFLEX) capsule 500 mg        500 mg Oral Every 8 hours 10/18/21 1036     10/16/21 0600  cefTRIAXone (ROCEPHIN) 1 g in sodium chloride 0.9 % 100 mL IVPB  Status:  Discontinued        1 g 200 mL/hr over 30 Minutes Intravenous Every 24 hours 10/15/21 0754 10/18/21 1036   10/15/21 0645  cefTRIAXone (ROCEPHIN) 1 g in sodium chloride 0.9 % 100 mL IVPB        1 g 200 mL/hr over 30 Minutes Intravenous  Once 10/15/21 2841 10/15/21 0721         Subjective: Patient seen and evaluated today and is somewhat somnolent.  According to staff, she is not eating or drinking.  She did have 1 Ensure shake earlier today, however.  Objective: Vitals:   10/18/21 1301 10/18/21 2202 10/19/21 0545 10/19/21 1004  BP: Marland Kitchen)  137/95 140/89 (!) 139/93 (!) 140/92  Pulse: 91 (!) 103 (!) 102   Resp: 18 20 16    Temp: 98.2 F (36.8 C) 98.9 F (37.2 C)    TempSrc: Oral Oral    SpO2: 94% 93% 96%   Weight:      Height:        Intake/Output Summary (Last 24 hours) at 10/19/2021 1331 Last data filed at 10/19/2021 0900 Gross per 24 hour  Intake 60 ml  Output 1350 ml  Net -1290 ml   Filed Weights   10/15/21 0359   Weight: 77 kg    Examination:  General exam: Appears calm and comfortable  Respiratory system: Clear to auscultation. Respiratory effort normal. Cardiovascular system: S1 & S2 heard, RRR.  Gastrointestinal system: Abdomen is soft Central nervous system: Alert and awake Extremities: No edema Skin: No significant lesions noted Psychiatry: Flat affect.    Data Reviewed: I have personally reviewed following labs and imaging studies  CBC: Recent Labs  Lab 10/14/21 1847 10/15/21 0438 10/16/21 0528 10/17/21 0607  WBC 10.0 15.9* 9.5 8.8  NEUTROABS 7.8* 14.1*  --   --   HGB 13.0 11.8* 11.0* 11.0*  HCT 41.0 37.2 34.3* 33.8*  MCV 91.3 90.3 90.7 89.4  PLT 158 160 136* 156   Basic Metabolic Panel: Recent Labs  Lab 10/14/21 1847 10/15/21 0438 10/16/21 0528 10/17/21 0607 10/18/21 0441  NA 134* 132* 135 138 136  K 4.4 3.9 3.7 3.8 3.8  CL 96* 95* 99 103 102  CO2 29 27 28 28 24   GLUCOSE 107* 133* 103* 88 92  BUN 19 21 22 21 16   CREATININE 1.05* 1.04* 0.90 0.89 0.80  CALCIUM 8.9 8.7* 8.5* 8.2* 8.3*  MG  --   --  1.6* 2.1 1.9   GFR: Estimated Creatinine Clearance: 49.4 mL/min (by C-G formula based on SCr of 0.8 mg/dL). Liver Function Tests: Recent Labs  Lab 10/14/21 1847 10/15/21 0438  AST 18 19  ALT 12 13  ALKPHOS 63 55  BILITOT 1.3* 1.6*  PROT 7.1 6.7  ALBUMIN 3.6 3.2*   No results for input(s): "LIPASE", "AMYLASE" in the last 168 hours. No results for input(s): "AMMONIA" in the last 168 hours. Coagulation Profile: No results for input(s): "INR", "PROTIME" in the last 168 hours. Cardiac Enzymes: No results for input(s): "CKTOTAL", "CKMB", "CKMBINDEX", "TROPONINI" in the last 168 hours. BNP (last 3 results) No results for input(s): "PROBNP" in the last 8760 hours. HbA1C: No results for input(s): "HGBA1C" in the last 72 hours. CBG: No results for input(s): "GLUCAP" in the last 168 hours. Lipid Profile: No results for input(s): "CHOL", "HDL", "LDLCALC",  "TRIG", "CHOLHDL", "LDLDIRECT" in the last 72 hours. Thyroid Function Tests: No results for input(s): "TSH", "T4TOTAL", "FREET4", "T3FREE", "THYROIDAB" in the last 72 hours. Anemia Panel: No results for input(s): "VITAMINB12", "FOLATE", "FERRITIN", "TIBC", "IRON", "RETICCTPCT" in the last 72 hours. Sepsis Labs: No results for input(s): "PROCALCITON", "LATICACIDVEN" in the last 168 hours.  Recent Results (from the past 240 hour(s))  Urine Culture     Status: Abnormal   Collection Time: 10/15/21  6:33 AM   Specimen: Urine, Clean Catch  Result Value Ref Range Status   Specimen Description   Final    URINE, CLEAN CATCH Performed at [facility], [address] [place], [place] [address]    Special Requests   Final    NONE Performed at [facility], [address], [place], [place] [address]    Culture >=100,000 COLONIES/mL  KLEBSIELLA PNEUMONIAE (A)  Final   Report Status 10/18/2021 FINAL  Final   Organism ID, Bacteria KLEBSIELLA PNEUMONIAE (A)  Final      Susceptibility   Klebsiella pneumoniae - MIC*    AMPICILLIN RESISTANT Resistant     CEFAZOLIN <=4 SENSITIVE Sensitive     CEFEPIME <=0.12 SENSITIVE Sensitive     CEFTRIAXONE <=0.25 SENSITIVE Sensitive     CIPROFLOXACIN <=0.25 SENSITIVE Sensitive     GENTAMICIN <=1 SENSITIVE Sensitive     IMIPENEM <=0.25 SENSITIVE Sensitive     NITROFURANTOIN 64 INTERMEDIATE Intermediate     TRIMETH/SULFA <=20 SENSITIVE Sensitive     AMPICILLIN/SULBACTAM 4 SENSITIVE Sensitive     PIP/TAZO <=4 SENSITIVE Sensitive     * >=100,000 COLONIES/mL KLEBSIELLA PNEUMONIAE         Radiology Studies: No results found.      Scheduled Meds:  acetaminophen  650 mg Oral Q6H   aspirin EC  81 mg Oral Daily   atenolol  12.5 mg Oral Daily   calcium-vitamin D  1 tablet Oral Daily   cephALEXin  500 mg Oral Q8H   diclofenac sodium  2 g Topical QID   ezetimibe  10 mg Oral Daily   feeding supplement  237 mL Oral BID BM   ketoconazole  1 Application  Topical Daily   mirtazapine  15 mg Oral QHS   multivitamin with minerals  1 tablet Oral Daily   pantoprazole  40 mg Oral Daily   rosuvastatin  40 mg Oral Daily   Continuous Infusions:  dextrose 5 % and 0.9% NaCl       LOS: 4 days    Time spent: 35 minutes    Kevin Mario Hoover Brunette, DO [facility]  If 7PM-7AM, please contact night-coverage [email or web address] 10/19/2021, 1:31 PM

## 2021-10-19 NOTE — TOC Progression Note (Signed)
Transition of Care [facility] - Progression Note    Patient Details  Name: [name] MRN: [id number] Date of Birth: Mar 08, 1934  Transition of Care [facility]) CM/SW Contact  Joaquin Courts, RN Phone Number: 10/19/2021, 2:43 PM  Clinical Narrative:    Authorization approved.  Approved 6/20 - 6/22, next review is 6/22, Candace Cruise id [id number], plan auth id [id number].    Expected Discharge Plan: [place] Barriers to Discharge: Continued Medical Work up  Expected Discharge Plan and Services Expected Discharge Plan: [place] In-house Referral: Clinical Social Work   Post Acute Care Choice: [place] Living arrangements for the past 2 months: [place] Determinants of Health (SDOH) Interventions    Readmission Risk Interventions    02/16/2021   10:54 AM  Readmission Risk Prevention Plan  Transportation Screening Complete  HRI or Home Care Consult Complete  Social Work Consult for [place] Planning/Counseling Complete  Palliative Care Screening Not Applicable  Medication Review Press photographer) Complete

## 2021-10-19 NOTE — Progress Notes (Addendum)
Noted patient had redness under breast, and blanchable redness to sacrum. Applied protective foam to sacrum. MD Manuella Ghazi made aware. New orders placed.

## 2021-10-20 DIAGNOSIS — G894 Chronic pain syndrome: Secondary | ICD-10-CM

## 2021-10-20 DIAGNOSIS — E66811 Obesity, class 1: Secondary | ICD-10-CM | POA: Diagnosis present

## 2021-10-20 DIAGNOSIS — I509 Heart failure, unspecified: Secondary | ICD-10-CM

## 2021-10-20 DIAGNOSIS — E669 Obesity, unspecified: Secondary | ICD-10-CM

## 2021-10-20 DIAGNOSIS — N3000 Acute cystitis without hematuria: Secondary | ICD-10-CM | POA: Diagnosis not present

## 2021-10-20 DIAGNOSIS — G9341 Metabolic encephalopathy: Secondary | ICD-10-CM | POA: Diagnosis not present

## 2021-10-20 DIAGNOSIS — N1831 Chronic kidney disease, stage 3a: Secondary | ICD-10-CM

## 2021-10-20 DIAGNOSIS — I482 Chronic atrial fibrillation, unspecified: Secondary | ICD-10-CM

## 2021-10-20 DIAGNOSIS — I10 Essential (primary) hypertension: Secondary | ICD-10-CM

## 2021-10-20 LAB — BASIC METABOLIC PANEL
Anion gap: 10 (ref 5–15)
BUN: 20 mg/dL (ref 8–23)
CO2: 22 mmol/L (ref 22–32)
Calcium: 8.7 mg/dL — ABNORMAL LOW (ref 8.9–10.3)
Chloride: 106 mmol/L (ref 98–111)
Creatinine, Ser: 0.82 mg/dL (ref 0.44–1.00)
GFR, Estimated: >60 mL/min (ref >60)
Glucose, Bld: 129 mg/dL — ABNORMAL HIGH (ref 70–99)
Potassium: 3.3 mmol/L — ABNORMAL LOW (ref 3.5–5.1)
Sodium: 138 mmol/L (ref 135–145)

## 2021-10-20 LAB — MAGNESIUM: Magnesium: 2.1 mg/dL (ref 1.7–2.4)

## 2021-10-20 MED ORDER — ENSURE ENLIVE PO LIQD: 237.0000 mL | Freq: Two times a day (BID) | ORAL | 0 refills | Status: AC

## 2021-10-20 MED ORDER — POTASSIUM CHLORIDE CRYS ER 20 MEQ PO TBCR
40.0000 meq | EXTENDED_RELEASE_TABLET | ORAL | Status: DC
  Administered 2021-10-20: 40 meq via ORAL
  Filled 2021-10-20: qty 2

## 2021-10-20 NOTE — Progress Notes (Signed)
Report called to [facility] at [facility]. EMS called for transport.

## 2021-10-20 NOTE — Discharge Summary (Addendum)
Physician Discharge Summary   Patient: [name] MRN: [id number] DOB: Mar 13, 1934  Admit date:     10/15/2021  Discharge date: 10/20/21  Discharge Physician: Tawni Millers   PCP: [name], MD   Recommendations at discharge:    Patient has completed antibiotic therapy in the hospital Holding opiate analgesics for now Follow up with primary care in 7 to 10 days.    I spoke over the phone with the patient's son about patient's  condition, plan of care, prognosis and all questions were addressed.   Discharge Diagnoses: Principal Problem:   Acute metabolic encephalopathy Active Problems:   Acute cystitis without hematuria   Atrial fibrillation, chronic (HCC)   Chronic pain syndrome   Stage 3a chronic kidney disease (CKD) (HCC)   Heart failure (HCC)   Hypertension   Class 1 obesity  Resolved Problems:   * No resolved hospital problems. [facility] Course: [name] was admitted to the hospital with the working diagnosis of acute metabolic encephalopathy in the setting of urinary tract infection.   88 yo female with the past medical history of atrial fibrillation, dementia, heart failure, chronic kidney diease and GERD who presented with altered mental status. At home patient had worsening weakness that prompted a ED evaluation, she was found stable and discharged home. Unfortunately her symptoms continue to worsen with poor oral intake and difficulty ambulating prompting her to come back to the ED. On her initial physical examination her blood pressure was 111/72, HR 106, RR 21 and 02 saturation 95%, lungs clear to auscultation, heart with S1 and S2 present irregularly irregular, abdomen not distended and no lower extremity edema.   Na 135, K 3,7 cl 99, bicarbonate 28, glucoe 103 bun 22 cr 0,90 Mag 1,6  Wbc 9,5 hgb 11,0 plt 136   UA SG 1,026, protein 30, 0-5 wbc, 0-5 rbc many bacteria  Urine culture positive for Klebsiella   Chest radiograph with  cardiomegaly with no infiltrates.   EKG 100 bpm, normal axis, normal intervals, atrial fibrillation rhythm with no significant ST segment or T wave changes, positive low voltage.   Patient was placed on supportive medical therapy and IV antibiotic therapy for urine infection.   Patient symptoms have improved, continue very weak and deconditioned.  Plan to transfer to SNF.    Assessment and Plan: * Acute metabolic encephalopathy Multifactorial encephalopathy.  Chronic dementia.   Patient was placed on supportive medical therapy including IV fluids and IV antibiotics for urine infection.   Her encephalopathy has improved, but continue to be very weak and deconditioned.  Pt and Ot have recommended transfer to SNF for further therapy.   Acute cystitis without hematuria Urinary tract infection.   Urine culture positive for Klebsiella resistant to ampicillin and nitrofurantoin.  She was treated with IV ceftriaxone and then transitioned to oral cephalexin with good toleration.   Plan to follow up as outpatient.    Atrial fibrillation, chronic ([place]) Patient remained rate control atrial fibrillation. She is not on anticoagulation due to severe deconditioning and risk of falls.   Stage 3a chronic kidney disease (CKD) (HCC) Hypokalemia. Hypomagnesemia   Renal function with serum cr at 0,82 with K at 3,3 and serum bicarbonate at 22.  Mg 2,1 today.   Plan to add Kcl po 40 meq x2 dosed before her discharge.  Follow up renal function and electrolytes as outpatient.  Her po intake is improving.   Chronic pain syndrome Pain has been controlled with duloxetine  and gabapentin.  Holding on opioid analgesics for now to prevent side effects.  Continue PT and OT at SNF.   Heart failure (HCC) Chronic diastolic heart failure Patient with no signs of exacerbation.  Plan to continue with atenolol.  Dyslipidemia continue statin therapy.   Hypertension Continue blood pressure control with  carvedilol.   Class 1 obesity Calculated BMI is 31,0          Consultants: none  Procedures performed: none   Disposition: Skilled nursing facility Diet recommendation:  Discharge Diet Orders (From admission, onward)     Start     Ordered   10/20/21 0000  Diet - low sodium heart healthy        10/20/21 1302           Cardiac diet DISCHARGE MEDICATION: Allergies as of 10/20/2021       Reactions   Sulfonamide Derivatives Hives, Rash        Medication List     STOP taking these medications    oxyCODONE 5 MG immediate release tablet Commonly known as: Oxy IR/ROXICODONE       TAKE these medications    acetaminophen 325 MG tablet Commonly known as: TYLENOL Take 2 tablets (650 mg total) by mouth every 6 (six) hours.   albuterol 108 (90 Base) MCG/ACT inhaler Commonly known as: VENTOLIN HFA Inhale 2 puffs into the lungs every 4 (four) hours as needed for wheezing or shortness of breath.   ammonium lactate 12 % cream Commonly known as: AMLACTIN Apply topically as needed for dry skin.   aspirin EC 81 MG tablet Take 1 po BID x 6 weeks then resume 1 po daily   atenolol 25 MG tablet Commonly known as: TENORMIN TAKE 1/2 TABLET BY MOUTH DAILY   Calcium Carbonate-Vitamin D3 600-400 MG-UNIT Tabs Take 1 tablet by mouth daily.   diclofenac sodium 1 % Gel Commonly known as: VOLTAREN Apply 2 g topically 4 (four) times daily. Rub into affected area of foot 2 to 4 times daily   DULoxetine 30 MG capsule Commonly known as: CYMBALTA TAKE 1 CAPSULE BY MOUTH DAILY WITH 60 MG FOR TOTAL DAILY DOSE OF 90 MG What changed:  See the new instructions. Another medication with the same name was removed. Continue taking this medication, and follow the directions you see here.   ezetimibe 10 MG tablet Commonly known as: Zetia Take 1 tablet (10 mg total) by mouth daily.   feeding supplement Liqd Take 237 mLs by mouth 2 (two) times daily between meals.   gabapentin 800  MG tablet Commonly known as: NEURONTIN TAKE 1 TABLET BY MOUTH 3 TIMES DAILY   ketoconazole 2 % cream Commonly known as: NIZORAL Apply 1 application topically daily.   mupirocin ointment 2 % Commonly known as: BACTROBAN Apply 1 application  topically 2 (two) times daily.   pantoprazole 40 MG tablet Commonly known as: PROTONIX TAKE 1 TABLET BY MOUTH ONCE DAILY   polyethylene glycol 17 g packet Commonly known as: MIRALAX / GLYCOLAX Take 17 g by mouth daily as needed for mild constipation or moderate constipation.   rosuvastatin 40 MG tablet Commonly known as: CRESTOR TAKE 1 TABLET BY MOUTH EACH EVENING What changed: See the new instructions.        Contact information for after-discharge care     Destination     [facility] .   Service: Skilled Nursing Contact information: [address] [place] [phone]  Discharge Exam: Filed Weights   10/15/21 0359  Weight: 77 kg   BP 138/87 (BP Location: Left Arm)   Pulse 93   Temp 97.6 F (36.4 C) (Oral)   Resp 19   Ht '5\' 2"'$  (1.575 m) Comment: Simultaneous filing. User may not have seen previous data.  Wt 77 kg Comment: Simultaneous filing. User may not have seen previous data.  SpO2 95%   BMI 31.05 kg/m   Patient with no chest pain or dyspnea, her po intake is improving  Neurology awake and alert ENT with pallor Cardiovascular with S1 and S2 present and rhythmic  Respiratory with no rales Abdomen not distended No lower extremity edema   Condition at discharge: stable  The results of significant diagnostics from this hospitalization (including imaging, microbiology, ancillary and laboratory) are listed below for reference.   Imaging Studies: DG Chest 2 View  Result Date: 10/14/2021 CLINICAL DATA:  hypoxia, evaluate for PNA EXAM: CHEST - 2 VIEW COMPARISON:  09/02/2021 FINDINGS: Cardiomegaly. Tortuous,  calcified aorta. No overt edema. No confluent opacities or effusions. No acute bony abnormality. IMPRESSION: Cardiomegaly.  No active disease. Electronically Signed   By: Rolm Baptise M.D.   On: 10/14/2021 19:19   CT Head Wo Contrast  Result Date: 10/14/2021 CLINICAL DATA:  Mental status change, unknown cause EXAM: CT HEAD WITHOUT CONTRAST TECHNIQUE: Contiguous axial images were obtained from the base of the skull through the vertex without intravenous contrast. RADIATION DOSE REDUCTION: This exam was performed according to the departmental dose-optimization program which includes automated exposure control, adjustment of the mA and/or kV according to patient size and/or use of iterative reconstruction technique. COMPARISON:  03/12/2021 FINDINGS: Brain: There is atrophy and chronic small vessel disease changes. No acute intracranial abnormality. Specifically, no hemorrhage, hydrocephalus, mass lesion, acute infarction, or significant intracranial injury. Vascular: No hyperdense vessel or unexpected calcification. Skull: No acute calvarial abnormality. Sinuses/Orbits: No acute findings Other: None IMPRESSION: Atrophy, chronic microvascular disease. No acute intracranial abnormality. Electronically Signed   By: Rolm Baptise M.D.   On: 10/14/2021 19:18    Microbiology: Results for orders placed or performed during the hospital encounter of 10/15/21  Urine Culture     Status: Abnormal   Collection Time: 10/15/21  6:33 AM   Specimen: Urine, Clean Catch  Result Value Ref Range Status   Specimen Description   Final    URINE, CLEAN CATCH Performed at [facility], [address] [place], [place] [address]    Special Requests   Final    NONE Performed at [facility], [address] [place], [place] [address]    Culture >=100,000 COLONIES/mL KLEBSIELLA PNEUMONIAE (A)  Final   Report Status 10/18/2021 FINAL  Final   Organism ID, Bacteria KLEBSIELLA PNEUMONIAE (A)  Final      Susceptibility   Klebsiella  pneumoniae - MIC*    AMPICILLIN RESISTANT Resistant     CEFAZOLIN <=4 SENSITIVE Sensitive     CEFEPIME <=0.12 SENSITIVE Sensitive     CEFTRIAXONE <=0.25 SENSITIVE Sensitive     CIPROFLOXACIN <=0.25 SENSITIVE Sensitive     GENTAMICIN <=1 SENSITIVE Sensitive     IMIPENEM <=0.25 SENSITIVE Sensitive     NITROFURANTOIN 64 INTERMEDIATE Intermediate     TRIMETH/SULFA <=20 SENSITIVE Sensitive     AMPICILLIN/SULBACTAM 4 SENSITIVE Sensitive     PIP/TAZO <=4 SENSITIVE Sensitive     * >=100,000 COLONIES/mL KLEBSIELLA PNEUMONIAE    Labs: CBC: Recent Labs  Lab 10/14/21 1847 10/15/21 0438 10/16/21 0528 10/17/21 2229  WBC 10.0 15.9* 9.5 8.8  NEUTROABS 7.8* 14.1*  --   --   HGB 13.0 11.8* 11.0* 11.0*  HCT 41.0 37.2 34.3* 33.8*  MCV 91.3 90.3 90.7 89.4  PLT 158 160 136* 676   Basic Metabolic Panel: Recent Labs  Lab 10/15/21 0438 10/16/21 0528 10/17/21 0607 10/18/21 0441 10/20/21 0444  NA 132* 135 138 136 138  K 3.9 3.7 3.8 3.8 3.3*  CL 95* 99 103 102 106  CO2 '27 28 28 24 22  '$ GLUCOSE 133* 103* 88 92 129*  BUN '21 22 21 16 20  '$ CREATININE 1.04* 0.90 0.89 0.80 0.82  CALCIUM 8.7* 8.5* 8.2* 8.3* 8.7*  MG  --  1.6* 2.1 1.9 2.1   Liver Function Tests: Recent Labs  Lab 10/14/21 1847 10/15/21 0438  AST 18 19  ALT 12 13  ALKPHOS 63 55  BILITOT 1.3* 1.6*  PROT 7.1 6.7  ALBUMIN 3.6 3.2*   CBG: No results for input(s): "GLUCAP" in the last 168 hours.  Discharge time spent: greater than 30 minutes.  Signed: Tawni Millers, MD [facility] 10/20/2021

## 2021-10-20 NOTE — Assessment & Plan Note (Signed)
Chronic diastolic heart failure Patient with no signs of exacerbation.  Plan to continue with atenolol.  Dyslipidemia continue statin therapy.

## 2021-10-20 NOTE — TOC Transition Note (Signed)
Transition of Care [facility]) - CM/SW Discharge Note   Patient Details  Name: [name] MRN: [id number] Date of Birth: [birth year]  [facility]) CM/SW Contact:  Shade Flood, LCSW Phone Number: 10/20/2021, 1:23 PM   Clinical Narrative:     Pt stable for dc to [facility] today per MD. Tyson Alias at [facility] and they can accept pt today. Updated pt's sons, Legrand Como and Kasandra Knudsen, by phone and left pt's daughter a HIPPA compliant message.  DC clinical sent electronically. RN to call report. EMS arranged.  There are no other TOC needs for dc.  Final next level of care: Skilled Nursing Facility Barriers to Discharge: Barriers Resolved   Patient Goals and CMS Choice Patient states their goals for this hospitalization and ongoing recovery are:: rehab CMS Medicare.gov Compare Post Acute Care list provided to:: Patient Represenative (must comment) Choice offered to / list presented to : Adult Children (daughter Learta Codding)  Discharge Placement              Patient chooses bed at: [facility] Patient to be transferred to facility by: EMS Name of family member notified: Learta Codding Patient and family notified of of transfer: 10/20/21  Discharge Plan and Services In-house Referral: Clinical Social Work   Post Acute Care Choice: [place]                               Social Determinants of Health (SDOH) Interventions     Readmission Risk Interventions    02/16/2021   10:54 AM  Readmission Risk Prevention Plan  Transportation Screening Complete  HRI or [place] Complete  Social Work Consult for [place] Planning/Counseling Complete  Palliative Care Screening Not Applicable  Medication Review [employer]) Complete

## 2021-10-20 NOTE — Hospital Course (Signed)
[name] was admitted to the hospital with the working diagnosis of acute metabolic encephalopathy in the setting of urinary tract infection.   88 yo female with the past medical history of atrial fibrillation, dementia, heart failure, chronic kidney diease and GERD who presented with altered mental status. At home patient had worsening weakness that prompted a ED evaluation, she was found stable and discharged home. Unfortunately her symptoms continue to worsen with poor oral intake and difficulty ambulating prompting her to come back to the ED. On her initial physical examination her blood pressure was 111/72, HR 106, RR 21 and 02 saturation 95%, lungs clear to auscultation, heart with S1 and S2 present irregularly irregular, abdomen not distended and no lower extremity edema.   Na 135, K 3,7 cl 99, bicarbonate 28, glucoe 103 bun 22 cr 0,90 Mag 1,6  Wbc 9,5 hgb 11,0 plt 136   UA SG 1,026, protein 30, 0-5 wbc, 0-5 rbc many bacteria  Urine culture positive for Klebsiella   Chest radiograph with cardiomegaly with no infiltrates.   EKG 100 bpm, normal axis, normal intervals, atrial fibrillation rhythm with no significant ST segment or T wave changes, positive low voltage.   Patient was placed on supportive medical therapy and IV antibiotic therapy for urine infection.   Patient symptoms have improved, continue very weak and deconditioned.  Plan to transfer to SNF.

## 2021-10-20 NOTE — Assessment & Plan Note (Signed)
Continue blood pressure control with carvedilol.  

## 2021-10-20 NOTE — Assessment & Plan Note (Signed)
Patient remained rate control atrial fibrillation. She is not on anticoagulation due to severe deconditioning and risk of falls.

## 2021-10-20 NOTE — Assessment & Plan Note (Signed)
Urinary tract infection.   Urine culture positive for Klebsiella resistant to ampicillin and nitrofurantoin.  She was treated with IV ceftriaxone and then transitioned to oral cephalexin with good toleration.   Plan to follow up as outpatient.

## 2021-10-20 NOTE — Assessment & Plan Note (Addendum)
Hypokalemia. Hypomagnesemia   Renal function with serum cr at 0,82 with K at 3,3 and serum bicarbonate at 22.  Mg 2,1 today.   Plan to add Kcl po 40 meq x2 dosed before her discharge.  Follow up renal function and electrolytes as outpatient.  Her po intake is improving.

## 2021-10-20 NOTE — Assessment & Plan Note (Signed)
Pain has been controlled with duloxetine and gabapentin.  Holding on opioid analgesics for now to prevent side effects.  Continue PT and OT at SNF.

## 2021-10-20 NOTE — Assessment & Plan Note (Addendum)
Multifactorial encephalopathy.  Chronic dementia.   Patient was placed on supportive medical therapy including IV fluids and IV antibiotics for urine infection.   Her encephalopathy has improved, but continue to be very weak and deconditioned.  Pt and Ot have recommended transfer to SNF for further therapy.

## 2021-10-20 NOTE — Progress Notes (Addendum)
Physical Therapy Treatment Patient Details Name: [name] MRN: [id number] DOB: [birth year] Today's Date: 10/20/2021   History of Present Illness [name] is a 88 y.o. female with medical history significant for chronic atrial fibrillation not on anticoagulation due to falls, mild dementia, prior UTI, chronic pain, dyslipidemia, chronic diastolic heart failure, stage IIIa chronic kidney disease, and GERD who was brought to the ED yesterday on account of some worsening weakness at home.  She was also noted to have some mild confusion, but work-up was noted to be negative and therefore patient was sent home.  She returned to the ED earlier this morning with worsening weakness and confusion and patient states that she was very prone to falling and unable to ambulate with her walker.  She has also had poor oral intake over the last 2 days, no fever, vomiting, diarrhea, chills, or falls.  She has had prior history of Klebsiella UTI which was sensitive to Rocephin.    PT Comments    Patient supine in bed upon arrival and agreeable to participating in therapy session with encouragement. Patient performed bed mobility with min assist to elevate trunk and use of bilateral upper extremities in bed rails after cuing. Patient able to perform transfers with min guard to min assist using RW and cuing for sequencing of steps and placement of hands. Patient ambulated 6 feet with RW from bed to chair and min guard assist for safety. Patient did exhibit gait deviations and hunched over posturing increasing her risk for falls.  Patient agreeable to sitting up on chair at end of therapy session. Overall, patient required less assistance to complete mobility. Limited by fatigue. On room air throughout session. Patient would continue to benefit from skilled physical therapy in current environment and next venue to continue return to prior function and increase strength, endurance, balance, coordination, and  functional mobility and gait skills.    Recommendations for follow up therapy are one component of a multi-disciplinary discharge planning process, led by the attending physician.  Recommendations may be updated based on patient status, additional functional criteria and insurance authorization.  Follow Up Recommendations  Skilled nursing-short term rehab (<3 hours/day) Can patient physically be transported by private vehicle: Yes   Assistance Recommended at Discharge Set up Supervision/Assistance  Patient can return home with the following Help with stairs or ramp for entrance;Assistance with cooking/housework;A lot of help with bathing/dressing/bathroom;Assist for transportation;Direct supervision/assist for medications management;Direct supervision/assist for financial management;A little help with walking and/or transfers   Equipment Recommendations  None recommended by PT    Recommendations for Other Services       Precautions / Restrictions Precautions Precautions: Fall Restrictions Weight Bearing Restrictions: No     Mobility  Bed Mobility Overal bed mobility: Needs Assistance Bed Mobility: Supine to Sit     Supine to sit: Min assist     General bed mobility comments: slow labored movement; cues for bed rail use with bilateral UEs; min assist for trunk    Transfers Overall transfer level: Needs assistance Equipment used: Rolling walker (2 wheels) Transfers: Sit to/from Stand, Bed to chair/wheelchair/BSC Sit to Stand: Min assist, Min guard   Step pivot transfers: Min assist, Min guard   Anterior-Posterior transfers: Min guard (verbal and gestural cues)   General transfer comment: slow labored movement; verbal and gestural cues for sequencing of steps and placement of hands with use of RW to sit to stand transfer    Ambulation/Gait Ambulation/Gait assistance: Min assist, Min guard Gait  Distance (Feet): 6 Feet Assistive device: Rolling walker (2 wheels) Gait  Pattern/deviations: Decreased step length - right, Decreased step length - left, Decreased stride length, Trunk flexed Gait velocity: decreased     General Gait Details: slow labored cadence with slightly decreased assistance required for ambulation with RW without loss of balance, limited mostly due to fatigue   Stairs             Wheelchair Mobility    Modified Rankin (Stroke Patients Only)       Balance Overall balance assessment: Needs assistance Sitting-balance support: Feet supported, No upper extremity supported Sitting balance-Leahy Scale: Fair Sitting balance - Comments: fair/good seated at EOB   Standing balance support: During functional activity, Bilateral upper extremity supported Standing balance-Leahy Scale: Fair Standing balance comment: using RW        Cognition Arousal/Alertness: Awake/alert Behavior During Therapy: WFL for tasks assessed/performed Overall Cognitive Status: Within Functional Limits for tasks assessed        Exercises General Exercises - Upper Extremity Shoulder Flexion: AROM, Strengthening, Both, 10 reps, Seated General Exercises - Lower Extremity Long Arc Quad: Seated, AROM, Strengthening, Both, 10 reps Hip ABduction/ADduction: Strengthening, Both, 10 reps, Seated Hip Flexion/Marching: Seated, AROM, Strengthening, Both, 10 reps Toe Raises: Seated, AROM, Strengthening, Both, 10 reps Heel Raises: Seated, AROM, Strengthening, Both, 10 reps    General Comments        Pertinent Vitals/Pain Pain Assessment Pain Assessment: No/denies pain    Home Living       Prior Function            PT Goals (current goals can now be found in the care plan section) Acute Rehab PT Goals Patient Stated Goal: return home after rehab PT Goal Formulation: With patient Time For Goal Achievement: 10/29/21 Progress towards PT goals: Progressing toward goals    Frequency    Min 3X/week      PT Plan Current plan remains  appropriate       AM-PAC PT "6 Clicks" Mobility   Outcome Measure  Help needed turning from your back to your side while in a flat bed without using bedrails?: A Little Help needed moving from lying on your back to sitting on the side of a flat bed without using bedrails?: A Little Help needed moving to and from a bed to a chair (including a wheelchair)?: A Little Help needed standing up from a chair using your arms (e.g., wheelchair or bedside chair)?: A Little Help needed to walk in hospital room?: A Lot Help needed climbing 3-5 steps with a railing? : Total 6 Click Score: 15    End of Session Equipment Utilized During Treatment: Gait belt Activity Tolerance: Patient tolerated treatment well;Patient limited by fatigue Patient left: in chair;with chair alarm set;with call bell/phone within reach Nurse Communication: Mobility status PT Visit Diagnosis: Unsteadiness on feet (R26.81);Other abnormalities of gait and mobility (R26.89);Muscle weakness (generalized) (M62.81)     Time: 3976-7341 PT Time Calculation (min) (ACUTE ONLY): 26 min  Charges:  $Therapeutic Exercise: 8-22 mins $Therapeutic Activity: 8-22 mins                     Floria Raveling. Hartnett-Rands, MS, PT Per [place] [phone]  Yvonne Hurry  [name] 10/20/2021, 12:08 PM

## 2021-10-20 NOTE — Assessment & Plan Note (Signed)
Calculated BMI is 31,0 

## 2021-10-24 ENCOUNTER — Other Ambulatory Visit: Payer: Self-pay | Admitting: Family Medicine

## 2021-10-29 DIAGNOSIS — I10 Essential (primary) hypertension: Secondary | ICD-10-CM | POA: Diagnosis not present

## 2021-10-29 DIAGNOSIS — I4891 Unspecified atrial fibrillation: Secondary | ICD-10-CM

## 2021-11-09 ENCOUNTER — Telehealth: Payer: Self-pay

## 2021-11-09 NOTE — Telephone Encounter (Signed)
Amy-PT w/[facility]   Called requesting verbal orders for pt:  2x's once a week for 1 wk 1x'x once a week for 2 wk   Also for gate training, balance, transfer and safety. Plus a home exercise program  "Ok" on behalf of Dr. Dennard Schaumann for the requested care service needed for pt

## 2021-11-12 ENCOUNTER — Telehealth: Payer: Self-pay

## 2021-11-12 NOTE — Telephone Encounter (Signed)
[name] w/[facility] called in to get verbal orders for OT for this pt.   Requesting OT for 1wk 1 or 1wk 4  "Ok" on behalf of Dr. Dennard Schaumann per requested service needed for pt

## 2021-11-23 ENCOUNTER — Other Ambulatory Visit: Payer: Self-pay | Admitting: Family Medicine

## 2021-12-09 ENCOUNTER — Ambulatory Visit: Payer: 59 | Admitting: Podiatry

## 2021-12-09 DIAGNOSIS — E1122 Type 2 diabetes mellitus with diabetic chronic kidney disease: Secondary | ICD-10-CM

## 2021-12-09 DIAGNOSIS — I509 Heart failure, unspecified: Secondary | ICD-10-CM | POA: Diagnosis not present

## 2021-12-09 DIAGNOSIS — D631 Anemia in chronic kidney disease: Secondary | ICD-10-CM

## 2021-12-09 DIAGNOSIS — N135 Crossing vessel and stricture of ureter without hydronephrosis: Secondary | ICD-10-CM

## 2021-12-09 DIAGNOSIS — I130 Hypertensive heart and chronic kidney disease with heart failure and stage 1 through stage 4 chronic kidney disease, or unspecified chronic kidney disease: Secondary | ICD-10-CM | POA: Diagnosis not present

## 2021-12-09 DIAGNOSIS — I4891 Unspecified atrial fibrillation: Secondary | ICD-10-CM | POA: Diagnosis not present

## 2021-12-09 DIAGNOSIS — N1831 Chronic kidney disease, stage 3a: Secondary | ICD-10-CM

## 2021-12-11 ENCOUNTER — Ambulatory Visit (INDEPENDENT_AMBULATORY_CARE_PROVIDER_SITE_OTHER): Payer: 59 | Admitting: Podiatry

## 2021-12-11 DIAGNOSIS — B351 Tinea unguium: Secondary | ICD-10-CM

## 2021-12-11 DIAGNOSIS — M79676 Pain in unspecified toe(s): Secondary | ICD-10-CM

## 2021-12-11 DIAGNOSIS — D2372 Other benign neoplasm of skin of left lower limb, including hip: Secondary | ICD-10-CM

## 2021-12-11 DIAGNOSIS — E1142 Type 2 diabetes mellitus with diabetic polyneuropathy: Secondary | ICD-10-CM | POA: Diagnosis not present

## 2021-12-11 DIAGNOSIS — D2371 Other benign neoplasm of skin of right lower limb, including hip: Secondary | ICD-10-CM

## 2021-12-11 NOTE — Progress Notes (Signed)
She presents today chief complaint of painful elongated toenails.  Objective: Pulses are strongly palpable.  Neurologic sensorium is diminished per Semmes Weinstein monofilament.  Deep tendon reflexes are not elicitable.  Toenails are long thick yellow dystrophic-like mycotic painful palpation.  Assessment: Pain in limb secondary to diabetic peripheral neuropathy and painful elongated nails.  Plan: Discussed etiology pathology and surgical therapies debrided toenails 1 through 5 bilateral.  Follow-up with her on as-needed basis.

## 2021-12-17 ENCOUNTER — Ambulatory Visit: Payer: Self-pay | Admitting: *Deleted

## 2021-12-17 ENCOUNTER — Encounter: Payer: Self-pay | Admitting: *Deleted

## 2021-12-17 DIAGNOSIS — I482 Chronic atrial fibrillation, unspecified: Secondary | ICD-10-CM

## 2021-12-17 DIAGNOSIS — I10 Essential (primary) hypertension: Secondary | ICD-10-CM

## 2021-12-17 NOTE — Patient Instructions (Signed)
Visit Information  Thank you for taking time to visit with me today. Please don't hesitate to contact me if I can be of assistance to you before our next scheduled telephone appointment.  Following are the goals we discussed today:  Take medications as prescribed   Attend all scheduled provider appointments Call pharmacy for medication refills 3-7 days in advance of running out of medications Call provider office for new concerns or questions  check pulse (heart) rate once a day make a plan to exercise regularly make a plan to eat healthy keep all lab appointments take medicine as prescribed check blood pressure 3 times per week choose a place to take my blood pressure (home, clinic or office, retail store) write blood pressure results in a log or diary learn about high blood pressure keep a blood pressure log take blood pressure log to all doctor appointments call doctor for signs and symptoms of high blood pressure keep all doctor appointments take medications for blood pressure exactly as prescribed report new symptoms to your doctor eat more whole grains, fruits and vegetables, lean meats and healthy fats Continue using pulse oximeter to check heart rate and oxygen saturation Try to walk daily- even if only a few minutes Case closure today- please let your doctor know if you have any future needs for care management  Please call the care guide team at [phone] if you need to cancel or reschedule your appointment.   If you are experiencing a Mental Health or [place] or need someone to talk to, please call the Suicide and Crisis Lifeline: 988 call the [place] National Suicide Prevention Lifeline: (971) 840-9679 or TTY: (818)368-7652 TTY 707-574-6172) to talk to a trained counselor call 1-800-273-TALK (toll free, 24 hour hotline) go to [facility] [address] [place] [phone]) call the [place]:  [phone] call 911   The patient verbalized understanding of instructions, educational materials, and care plan provided today and DECLINED offer to receive copy of patient instructions, educational materials, and care plan.   No further follow up required: case closure  Jacqlyn Larsen [facility], BSN RN Case Manager [facility] [phone]

## 2021-12-17 NOTE — Chronic Care Management (AMB) (Signed)
   12/17/2021  [name] 1933/07/20 [id number]   Incoming call received from patient's son [name], spoke with patient's son about plan of care and case closure, Audelia Acton reports patient has all medications and taking as prescribed, no new concerns reported, verbalizes understanding of case closure today.  Jacqlyn Larsen RNC, BSN RN Case Manager [facility] [phone]

## 2021-12-17 NOTE — Chronic Care Management (AMB) (Signed)
Care Management    RN Visit Note  12/17/2021 Name: [name] MRN: [id number] DOB: 29-Jul-1933  Subjective: [name] is a 88 y.o. year old female who is a primary care patient of Pickard, [name], MD. The care management team was consulted for assistance with disease management and care coordination needs.    Engaged with patient by telephone for follow up visit in response to provider referral for case management and/or care coordination services.   Consent to Services:   Ms. Bognar was given information about Care Management services today including:  Care Management services includes personalized support from designated clinical staff supervised by her physician, including individualized plan of care and coordination with other care providers 24/7 contact phone numbers for assistance for urgent and routine care needs. The patient may stop case management services at any time by phone call to the office staff.  Patient agreed to services and consent obtained.   Assessment: Review of patient past medical history, allergies, medications, health status, including review of consultants reports, laboratory and other test data, was performed as part of comprehensive evaluation and provision of chronic care management services.   SDOH (Social Determinants of Health) assessments and interventions performed:    Care Plan  Allergies  Allergen Reactions   Sulfonamide Derivatives Hives and Rash    Outpatient Encounter Medications as of 12/17/2021  Medication Sig Note   acetaminophen (TYLENOL) 325 MG tablet Take 2 tablets (650 mg total) by mouth every 6 (six) hours.    albuterol (VENTOLIN HFA) 108 (90 Base) MCG/ACT inhaler Inhale 2 puffs into the lungs every 4 (four) hours as needed for wheezing or shortness of breath.    ammonium lactate (AMLACTIN) 12 % cream Apply topically as needed for dry skin.    aspirin EC 81 MG tablet Take 1 po BID x 6 weeks then resume 1 po daily     atenolol (TENORMIN) 25 MG tablet TAKE 1/2 TABLET BY MOUTH DAILY    Calcium Carbonate-Vitamin D3 600-400 MG-UNIT TABS Take 1 tablet by mouth daily.    diclofenac sodium (VOLTAREN) 1 % GEL Apply 2 g topically 4 (four) times daily. Rub into affected area of foot 2 to 4 times daily    DULoxetine (CYMBALTA) 60 MG capsule Take by mouth.    ezetimibe (ZETIA) 10 MG tablet Take 1 tablet (10 mg total) by mouth daily. 10/14/2021: Pt has not started yet   gabapentin (NEURONTIN) 800 MG tablet TAKE 1 TABLET BY MOUTH THREE TIMES DAILY    ketoconazole (NIZORAL) 2 % cream Apply 1 application topically daily.    mupirocin ointment (BACTROBAN) 2 % Apply 1 application  topically 2 (two) times daily.    pantoprazole (PROTONIX) 40 MG tablet TAKE 1 TABLET BY MOUTH ONCE DAILY    polyethylene glycol (MIRALAX / GLYCOLAX) 17 g packet Take 17 g by mouth daily as needed for mild constipation or moderate constipation.    rosuvastatin (CRESTOR) 40 MG tablet TAKE 1 TABLET BY MOUTH EACH EVENING (Patient taking differently: Take 40 mg by mouth daily.)    No facility-administered encounter medications on file as of 12/17/2021.    Patient Active Problem List   Diagnosis Date Noted   Class 1 obesity 10/20/2021   Heart failure ([place]) 10/20/2021   Hypertension    Acute cystitis without hematuria    Stage 3a chronic kidney disease (CKD) ([place]) 02/13/2021   Dehydration 02/13/2021   Lactic acidosis 02/13/2021   Hypokalemia 04/88/8916   Metabolic alkalosis 94/50/3888  Acute metabolic encephalopathy 43/32/9518   Closed left hip fracture (HCC) 08/25/2020   Hyperglycemia 08/25/2020   Elevated brain natriuretic peptide (BNP) level 08/25/2020   GERD (gastroesophageal reflux disease) 08/25/2020   Cerebrovascular accident (CVA) due to stenosis of basilar artery ([place]) 07/17/2019   Dyspnea 07/09/2019   Chronic diastolic CHF (congestive heart failure) ([place]) 07/09/2019   Chest pain    Coronary artery calcification seen on CT scan     Gait abnormality 01/24/2019   Tremor 01/24/2019   Memory loss 01/24/2019   Chronic bilateral low back pain without sciatica 06/06/2018   At risk for falls 06/06/2018   Senile osteoporosis 06/06/2018   Atelectasis of left lung 04/06/2018   Acute encephalopathy 02/09/2018   ESBL (extended spectrum beta-lactamase) producing bacteria infection 02/02/2018   Atrial fibrillation, chronic ([place]) 02/02/2018   Constipation 01/31/2018   Urolithiasis 01/30/2018   T12 compression fracture ([place]) 09/04/2017   Fall at home, initial encounter 07/03/2017   Tachycardia 02/03/2017   Chronic pain syndrome 02/02/2017   Dizziness 12/15/2016   Inflammation of sacroiliac joint ([place]) 05/07/2015   Lumbar spondylosis with myelopathy 02/11/2014   Peripheral polyneuropathy 02/11/2014   Opioid dependence ([place]) 02/11/2014   Abnormal EKG 11/21/2013   Displacement of lumbar intervertebral disc without myelopathy 11/18/2013   Lumbar radiculopathy 11/18/2013   [name] (abdominal aortic aneurysm) ([place]) 09/30/2013   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[place]) 01/03/2013   OSA (obstructive sleep apnea) 06/29/2012   Pre-diabetes 06/29/2012   Hypoxia, sleep related 06/29/2012   RA (rheumatoid arthritis) ([place]) 06/29/2012   Nonspecific abnormal results of cardiovascular function study 06/02/2011   Nonspecific abnormal electrocardiogram (ECG) (EKG) 05/18/2011   Osteoporosis 04/09/2008   Hyperlipidemia 05/12/2006   DEPENDENT EDEMA, LEGS, BILATERAL 05/12/2006   PERIPHERAL NEUROPATHY 03/11/2006   Essential hypertension 03/11/2006    Conditions to be addressed/monitored: Atrial Fibrillation and HTN  Care Plan : RN Care Manager Plan of Care  Updates made by Kassie Mends, RN since 12/17/2021 12:00 AM  Completed 12/17/2021   Problem: No plan of care established for management of chronic disease state  (Hypertension, Atrial Fibrillation) Resolved 12/17/2021  Priority: High     Long-Range Goal: Development of plan of care for  chronic disease management (HTN, Atrial Fibrillation) Completed 12/17/2021  Start Date: 10/11/2021  Expected End Date: 04/09/2022  Priority: High  Note:   Current Barriers:  Knowledge Deficits related to plan of care for management of Atrial Fibrillation and HTN  Chronic Disease Management support and education needs related to Atrial Fibrillation and HTN Knowledge deficits related to self health management of chronic afib Knowledge Deficits related to atrial fibrillation action plan Knowledge Deficits related to basic understanding of hypertension pathophysiology and self care management- pt does not have blood pressure cuff, son states he will look in [employer] benefit catalog to obtain a cuff and start checking blood pressure. No Advanced Directives in place- patient's son reports they did complete advanced directives and had notarized Unable to self administer medications as prescribed- son provides oversight for medications Unable to perform ADLs independently- pt lives with son who is primary caregiver and there are 2 other sons in the home, pt is able to walk with assistance of walker, has wheelchair and ramp, had hip fracture in April 2023. Patient has PCS services M-F for 3 hours daily to assist with bathing and other ADL's. Unable to perform IADLs independently- son/ family assists 12/17/21- unable to reach patient's son Audelia Acton (left message), spoke with patient's daughter [name] who reports  she will have Shane call RN care Freight forwarder, Learta Codding states she does not know of any changes with patient, continues with PCS services and lives with Audelia Acton and another son who continue to provide oversight with medications and all of patient's care,  no new concerns reported today.  RNCM Clinical Goal(s):  Patient will verbalize understanding of plan for management of Atrial Fibrillation and HTN as evidenced by patient, caregiver report, review of EHR and  through collaboration with RN Care manager,  provider, and care team.   Interventions: 1:1 collaboration with primary care provider regarding development and update of comprehensive plan of care as evidenced by provider attestation and co-signature Inter-disciplinary care team collaboration (see longitudinal plan of care) Evaluation of current treatment plan related to  self management and patient's adherence to plan as established by provider   AFIB Interventions: (Status:  Goal on track:  Yes.) Long Term Goal   Counseled on increased risk of stroke due to Afib and benefits of anticoagulation for stroke prevention Reviewed importance of adherence to anticoagulant exactly as prescribed Counseled on importance of regular laboratory monitoring as prescribed Afib action plan reviewed Plan of care reviewed including case closure  Hypertension Interventions:  (Status:  Goal on track:  Yes.) Long Term Goal Last practice recorded BP readings:  BP Readings from Last 3 Encounters:  10/01/21 130/82  09/02/21 123/72  07/13/21 115/63  Most recent eGFR/CrCl:  Lab Results  Component Value Date   EGFR 50 (L) 02/23/2021    No components found for: "CRCL"  Evaluation of current treatment plan related to hypertension self management and patient's adherence to plan as established by provider Reviewed medications with patient and discussed importance of compliance Counseled on the importance of exercise goals with target of 150 minutes per week Advised patient, providing education and rationale, to monitor blood pressure daily and record, calling PCP for findings outside established parameters Discussed complications of poorly controlled blood pressure such as heart disease, stroke, circulatory complications, vision complications, kidney impairment, sexual dysfunction  Reinforced importance of following low sodium diet Reviewed importance of taking medications as prescribed  Patient Goals/Self-Care Activities: Take medications as prescribed    Attend all scheduled provider appointments Call pharmacy for medication refills 3-7 days in advance of running out of medications Call provider office for new concerns or questions  - check pulse (heart) rate once a day - make a plan to exercise regularly - make a plan to eat healthy - keep all lab appointments - take medicine as prescribed check blood pressure 3 times per week choose a place to take my blood pressure (home, clinic or office, retail store) write blood pressure results in a log or diary learn about high blood pressure keep a blood pressure log take blood pressure log to all doctor appointments call doctor for signs and symptoms of high blood pressure keep all doctor appointments take medications for blood pressure exactly as prescribed report new symptoms to your doctor eat more whole grains, fruits and vegetables, lean meats and healthy fats Continue using pulse oximeter to check heart rate and oxygen saturation Try to walk daily- even if only a few minutes Case closure today- please let your doctor know if you have any future needs for care management       Plan: No further follow up required: case closure  Jacqlyn Larsen [facility], BSN RN Case Manager [facility] [phone]

## 2021-12-24 ENCOUNTER — Telehealth: Payer: 59

## 2021-12-30 ENCOUNTER — Ambulatory Visit (INDEPENDENT_AMBULATORY_CARE_PROVIDER_SITE_OTHER): Payer: 59 | Admitting: Family Medicine

## 2021-12-30 VITALS — BP 126/62 | HR 91 | Temp 97.1°F | Ht 61.0 in | Wt 169.0 lb

## 2021-12-30 DIAGNOSIS — R300 Dysuria: Secondary | ICD-10-CM | POA: Diagnosis not present

## 2021-12-30 DIAGNOSIS — L304 Erythema intertrigo: Secondary | ICD-10-CM | POA: Diagnosis not present

## 2021-12-30 LAB — URINALYSIS, ROUTINE W REFLEX MICROSCOPIC
Bilirubin Urine: NEGATIVE
Glucose, UA: NEGATIVE
Hgb urine dipstick: NEGATIVE
Ketones, ur: NEGATIVE
Nitrite: POSITIVE — AB
Protein, ur: NEGATIVE
Specific Gravity, Urine: 1.025 (ref 1.001–1.035)
pH: 5.5 (ref 5.0–8.0)

## 2021-12-30 LAB — MICROSCOPIC MESSAGE

## 2021-12-30 MED ORDER — CEPHALEXIN 500 MG PO CAPS: 500.0000 mg | ORAL_CAPSULE | Freq: Three times a day (TID) | ORAL | 0 refills | Status: DC

## 2021-12-30 MED ORDER — CLOTRIMAZOLE-BETAMETHASONE 1-0.05 % EX CREA: 1.0000 | TOPICAL_CREAM | Freq: Two times a day (BID) | CUTANEOUS | 0 refills | Status: DC

## 2021-12-30 NOTE — Addendum Note (Signed)
Addended by: Randal Buba K on: 12/30/2021 11:19 AM   Modules accepted: Orders

## 2021-12-30 NOTE — Progress Notes (Signed)
Subjective:    Patient ID: [name], female    DOB: 1933/11/05, 88 y.o.   MRN: [id number]  Dysuria   Medication Refill   Patient is a very pleasant 88 year old Caucasian female who is confined to a wheelchair who presents today with dysuria.  Was admitted to the hospital in June with a Klebsiella UTI.  Klebsiella was sensitive to Cipro and Keflex.  She was treated with Keflex.  Over the last few days she has developed dysuria and frequency and urgency again.  She also reports irritation and discomfort on her upper inner thighs in the crease between her upper thigh and her vulva and also in the clitoral hood.  Chaperone was present for the exam.  I had the patient's stand.  After we removed her diaper, there was erythema on the skin in the crease between the thigh and the vulva.  There was also some erythema and irritation on the labia majora near the clitoral hood suspicious for Candida intertrigo snacks guys my class Past Medical History:  Diagnosis Date   [name] (abdominal aortic aneurysm) ([place]) 09/2013   3.6 cm   Acute and chronic respiratory failure with hypoxia (HCC)    Acute delirium    Acute encephalopathy    Acute pyelonephritis    Atrial fibrillation (HCC)    Chronic pain syndrome    Constipation    Dementia (HCC)    Dependent edema    bilateral legs    Depression    Diabetes mellitus    pre-diabetes   Diverticulosis    Diverticulosis    Dysuria    E coli bacteremia    Fall    Gross hematuria    Hydronephrosis with renal and ureteral calculous obstruction    Hyperlipidemia    Hypertension    Hypoxemia    Idiopathic peripheral autonomic neuropathy    Infestation by bed bug    Nephrolithiasis    OSA on CPAP    Osteoarthritis    Osteopenia    T=  -2.1 in hip   Persistent mood (affective) disorder, unspecified (HCC)    Prediabetes    Rheumatoid arthritis (HCC)    Rheumatoid arthritis(714.0)    S/P carpal tunnel release    Sepsis secondary to UTI [facility])     Spinal stenosis    Unspecified fracture of fifth lumbar vertebra, initial encounter for closed fracture ([place])    Unspecified fracture of t11-T12 vertebra, initial encounter for closed fracture ([place])    Urinary incontinence    Urinary tract infection    hx of    Urolithiasis    Past Surgical History:  Procedure Laterality Date   APPENDECTOMY     CARPAL TUNNEL RELEASE  12/27/2006   Right subcutaneous ulnar nerve transfer -- Right open carpal tunnel release   CHOLECYSTECTOMY     CYSTOSCOPY W/ URETERAL STENT PLACEMENT Right 01/30/2018   Procedure: CYSTOSCOPY WITH STENT REPLACEMENT retrograde pylegram;  Surgeon: Ardis Hughs, MD;  Location: [facility];  Service: Urology;  Laterality: Right;   CYSTOSCOPY WITH URETEROSCOPY AND STENT PLACEMENT Right 03/01/2018   Procedure: CYSTOSCPY/RIGHT URETEROSCOPY HOLMIUM LASER LITHOTRIPSY AND STENT EXCHANGE;  Surgeon: Ardis Hughs, MD;  Location: [facility];  Service: Urology;  Laterality: Right;   FOOT SURGERY     HIP ARTHROPLASTY Left 08/27/2020   Procedure: ARTHROPLASTY BIPOLAR HIP (HEMIARTHROPLASTY);  Surgeon: Mordecai Rasmussen, MD;  Location: [facility];  Service: Orthopedics;  Laterality: Left;   HOLMIUM LASER APPLICATION Right 83/66/2947   Procedure:  HOLMIUM LASER APPLICATION;  Surgeon: Ardis Hughs, MD;  Location: [facility];  Service: Urology;  Laterality: Right;   JOINT REPLACEMENT     BTKR, RTHR   TOTAL KNEE ARTHROPLASTY  08/21/2007   Right total knee replacement   Current Outpatient Medications on File Prior to Visit  Medication Sig Dispense Refill   acetaminophen (TYLENOL) 325 MG tablet Take 2 tablets (650 mg total) by mouth every 6 (six) hours.     albuterol (VENTOLIN HFA) 108 (90 Base) MCG/ACT inhaler Inhale 2 puffs into the lungs every 4 (four) hours as needed for wheezing or shortness of breath.     ammonium lactate (AMLACTIN) 12 % cream Apply topically as needed for dry skin. 385 g 0   aspirin EC 81 MG tablet Take 1 po BID x 6 weeks  then resume 1 po daily 30 tablet 11   atenolol (TENORMIN) 25 MG tablet TAKE 1/2 TABLET BY MOUTH DAILY 15 tablet 10   Calcium Carbonate-Vitamin D3 600-400 MG-UNIT TABS Take 1 tablet by mouth daily.     diclofenac sodium (VOLTAREN) 1 % GEL Apply 2 g topically 4 (four) times daily. Rub into affected area of foot 2 to 4 times daily 100 g 2   DULoxetine (CYMBALTA) 30 MG capsule Take 30 mg by mouth daily.     DULoxetine (CYMBALTA) 60 MG capsule Take by mouth.     ezetimibe (ZETIA) 10 MG tablet Take 1 tablet (10 mg total) by mouth daily. 90 tablet 3   gabapentin (NEURONTIN) 800 MG tablet TAKE 1 TABLET BY MOUTH THREE TIMES DAILY 90 tablet 10   ketoconazole (NIZORAL) 2 % cream Apply 1 application topically daily.     mupirocin ointment (BACTROBAN) 2 % Apply 1 application  topically 2 (two) times daily.     pantoprazole (PROTONIX) 40 MG tablet TAKE 1 TABLET BY MOUTH ONCE DAILY 30 tablet 2   polyethylene glycol (MIRALAX / GLYCOLAX) 17 g packet Take 17 g by mouth daily as needed for mild constipation or moderate constipation.     rosuvastatin (CRESTOR) 40 MG tablet TAKE 1 TABLET BY MOUTH EACH EVENING (Patient taking differently: Take 40 mg by mouth daily.) 90 tablet 1   No current facility-administered medications on file prior to visit.   Allergies  Allergen Reactions   Sulfonamide Derivatives Hives and Rash   Social History   Socioeconomic History   Marital status: Divorced    Spouse name: Not on file   Number of children: 6   Years of education: 8th grade   Highest education level: Not on file  Occupational History   Occupation: Retired  Tobacco Use   Smoking status: Never   Smokeless tobacco: Never  Vaping Use   Vaping Use: Never used  Substance and Sexual Activity   Alcohol use: No   Drug use: No   Sexual activity: Not on file  Other Topics Concern   Not on file  Social History Narrative   Lives at home with 2 sons   She has four living children.   Right-handed.   Two cups  caffeine per day.   Social Determinants of Health   Financial Resource Strain: Low Risk  (10/23/2020)   Overall Financial Resource Strain (CARDIA)    Difficulty of Paying Living Expenses: Not hard at all  Food Insecurity: No Food Insecurity (12/07/2020)   Hunger Vital Sign    Worried About Running Out of Food in the Last Year: Never true    Ran Out of Food  in the Last Year: Never true  Transportation Needs: No Transportation Needs (12/07/2020)   PRAPARE - Hydrologist (Medical): No    Lack of Transportation (Non-Medical): No  Physical Activity: Inactive (10/23/2020)   Exercise Vital Sign    Days of Exercise per Week: 0 days    Minutes of Exercise per Session: 0 min  Stress: No Stress Concern Present (10/23/2020)   [place]    Feeling of Stress : Not at all  Social Connections: Socially Isolated (10/23/2020)   Social Connection and Isolation Panel [NHANES]    Frequency of Communication with Friends and Family: Once a week    Frequency of Social Gatherings with Friends and Family: More than three times a week    Attends Religious Services: Never    Marine scientist or Organizations: No    Attends Archivist Meetings: Never    Marital Status: Divorced  Human resources officer Violence: Not At Risk (10/23/2020)   Humiliation, Afraid, Rape, and Kick questionnaire    Fear of Current or Ex-Partner: No    Emotionally Abused: No    Physically Abused: No    Sexually Abused: No     Review of Systems  Genitourinary:  Positive for dysuria.  All other systems reviewed and are negative.      Objective:   Physical Exam Exam conducted with a chaperone present.  Constitutional:      General: She is not in acute distress.    Appearance: Normal appearance. She is normal weight. She is not ill-appearing, toxic-appearing or diaphoretic.  Cardiovascular:     Rate and Rhythm: Normal rate.  Rhythm irregular.     Heart sounds: Normal heart sounds. No murmur heard.    No gallop.  Pulmonary:     Effort: Pulmonary effort is normal. No tachypnea or respiratory distress.     Breath sounds: No wheezing or rhonchi.  Chest:     Chest wall: No tenderness or edema.  Abdominal:     General: There is no distension.     Tenderness: There is no abdominal tenderness. There is no guarding or rebound.  Genitourinary:    Labia:        Right: Rash present.        Left: Rash present.   Musculoskeletal:     Right lower leg: No edema.     Left lower leg: No edema.  Neurological:     Mental Status: She is alert.           Assessment & Plan:  Dysuria  Intertrigo I believe the patient has a UTI.  Begin Keflex 500 mg p.o. 3 times daily for 7 days.  Meanwhile I will treat Candida intertrigo with Lotrisone cream apply twice daily for 1 week to the affected area.  Recommended that they allow the area to stay open to the air is much as possible.  I believe that sitting in her diaper and staying damp is irritating the skin more.  Therefore I recommended that they allow her to wear loosefitting clothing to try to facilitate keeping the area dry

## 2022-01-13 ENCOUNTER — Other Ambulatory Visit: Payer: 59

## 2022-01-21 ENCOUNTER — Other Ambulatory Visit: Payer: Self-pay | Admitting: Family Medicine

## 2022-01-21 DIAGNOSIS — I6381 Other cerebral infarction due to occlusion or stenosis of small artery: Secondary | ICD-10-CM

## 2022-01-24 NOTE — Telephone Encounter (Signed)
Requested Prescriptions  Pending Prescriptions Disp Refills  . rosuvastatin (CRESTOR) 40 MG tablet [Pharmacy Med Name: ROSUVASTATIN '40MG'$  TAB 40 Tablet] 30 tablet 10    Sig: TAKE 1 TABLET BY MOUTH EACH EVENING     Cardiovascular:  Antilipid - Statins 2 Failed - 01/21/2022  6:29 PM      Failed - Lipid Panel in normal range within the last 12 months    Cholesterol  Date Value Ref Range Status  10/01/2021 241 (H) <200 mg/dL Final   LDL Cholesterol (Calc)  Date Value Ref Range Status  10/01/2021 162 (H) mg/dL (calc) Final    Comment:    Reference range: <100 . Desirable range <100 mg/dL for primary prevention;   <70 mg/dL for patients with CHD or diabetic patients  with > or = 2 CHD risk factors. Marland Kitchen LDL-C is now calculated using the Martin-Hopkins  calculation, which is a validated novel method providing  better accuracy than the Friedewald equation in the  estimation of LDL-C.  Cresenciano Genre et al. Annamaria Helling. 7425;956(38): 2061-2068  (http://education.[facility].com/faq/FAQ164)    HDL  Date Value Ref Range Status  10/01/2021 57 > OR = 50 mg/dL Final   Triglycerides  Date Value Ref Range Status  10/01/2021 108 <150 mg/dL Final         Passed - Cr in normal range and within 360 days    Creat  Date Value Ref Range Status  02/23/2021 1.08 (H) 0.60 - 0.95 mg/dL Final   Creatinine, Ser  Date Value Ref Range Status  10/20/2021 0.82 0.44 - 1.00 mg/dL Final   Creatinine, Urine  Date Value Ref Range Status  03/08/2007 135.3 mg/dL Final         Passed - Patient is not pregnant      Passed - Valid encounter within last 12 months    Recent Outpatient Visits          3 months ago History of CVA (cerebrovascular accident)   [place] [name], MD   10 months ago Asymptomatic bacteriuria   [place] Dennard Schaumann, [name], MD   11 months ago Dehydration   [place] Pickard, [name], MD   1 year ago Atrial  fibrillation, chronic ([place])   [place] Dennard Schaumann, [name], MD   1 year ago Acute cystitis without hematuria   [place] Noemi Chapel A, NP             . pantoprazole ([place]) 40 MG tablet [Pharmacy Med Name: PANTOPRAZOLE DR '40MG'$  TAB 40 Tablet] 30 tablet 10    Sig: TAKE 1 TABLET BY MOUTH ONCE DAILY     Gastroenterology: Proton Pump Inhibitors Passed - 01/21/2022  6:29 PM      Passed - Valid encounter within last 12 months    Recent Outpatient Visits          3 months ago History of CVA (cerebrovascular accident)   [place] [name], MD   10 months ago Asymptomatic bacteriuria   [place] [name], MD   11 months ago Dehydration   [place] Dennard Schaumann, [name], MD   1 year ago Atrial fibrillation, chronic ([place])   [place] Pickard, [name], MD   1 year ago Acute cystitis without hematuria   [place] Eulogio Bear, NP

## 2022-02-24 ENCOUNTER — Other Ambulatory Visit: Payer: Self-pay

## 2022-02-24 DIAGNOSIS — G894 Chronic pain syndrome: Secondary | ICD-10-CM

## 2022-02-24 MED ORDER — DULOXETINE HCL 30 MG PO CPEP: 30.0000 mg | ORAL_CAPSULE | Freq: Every day | ORAL | 3 refills | Status: DC

## 2022-02-24 MED ORDER — DULOXETINE HCL 60 MG PO CPEP: 60.0000 mg | ORAL_CAPSULE | Freq: Every day | ORAL | 3 refills | Status: DC

## 2022-03-17 ENCOUNTER — Ambulatory Visit (INDEPENDENT_AMBULATORY_CARE_PROVIDER_SITE_OTHER): Payer: 59 | Admitting: Podiatry

## 2022-03-17 ENCOUNTER — Encounter: Payer: Self-pay | Admitting: Podiatry

## 2022-03-17 DIAGNOSIS — B351 Tinea unguium: Secondary | ICD-10-CM

## 2022-03-17 DIAGNOSIS — M79676 Pain in unspecified toe(s): Secondary | ICD-10-CM

## 2022-03-17 DIAGNOSIS — E1142 Type 2 diabetes mellitus with diabetic polyneuropathy: Secondary | ICD-10-CM

## 2022-03-17 DIAGNOSIS — D2372 Other benign neoplasm of skin of left lower limb, including hip: Secondary | ICD-10-CM | POA: Diagnosis not present

## 2022-03-17 DIAGNOSIS — D2371 Other benign neoplasm of skin of right lower limb, including hip: Secondary | ICD-10-CM

## 2022-03-17 NOTE — Progress Notes (Signed)
She presents today with her son chief complaint of painful elongated toenails multiple calluses plantar aspect of forefoot bilaterally.  Objective: Pulses are strongly palpable.  Feet are dry and scaly.  She does have no open lesions or wounds.  Toenails are long thick yellow dystrophic clinical mycotic history of amputation of toes 2 and 3 of the right foot.  Assessment diabetes diabetic peripheral neuropathy angiopathy history of amputation.  Pain limb secondary onychomycosis.  Plan: Debridement of toenails 1 through 5 bilateral.  Debrided all benign skin lesions bilateral.

## 2022-03-23 ENCOUNTER — Telehealth: Payer: Self-pay | Admitting: Family Medicine

## 2022-03-23 NOTE — Telephone Encounter (Signed)
Left message for patient to call back and schedule Medicare Annual Wellness Visit (AWV) in office.   If not able to come in office, please offer to do virtually or by telephone.   Last AWV: 10/23/2020   Please schedule at anytime with [facility] [place]  If any questions, please contact me at [phone].  Thank you ,  Colletta [place]

## 2022-04-01 ENCOUNTER — Ambulatory Visit (HOSPITAL_COMMUNITY)
Admission: RE | Admit: 2022-04-01 | Discharge: 2022-04-01 | Disposition: A | Discharge status: Home / Self Care | Payer: 59 | Source: Ambulatory Visit | Attending: Cardiovascular Disease | Admitting: Cardiovascular Disease

## 2022-04-01 DIAGNOSIS — I714 Abdominal aortic aneurysm, without rupture, unspecified: Secondary | ICD-10-CM | POA: Diagnosis present

## 2022-04-01 DIAGNOSIS — I7141 Pararenal abdominal aortic aneurysm, without rupture: Secondary | ICD-10-CM | POA: Diagnosis not present

## 2022-04-04 ENCOUNTER — Encounter: Payer: Self-pay | Admitting: *Deleted

## 2022-04-19 ENCOUNTER — Ambulatory Visit (INDEPENDENT_AMBULATORY_CARE_PROVIDER_SITE_OTHER): Payer: 59

## 2022-04-19 VITALS — Ht 61.0 in | Wt 161.0 lb

## 2022-04-19 DIAGNOSIS — Z0000 Encounter for general adult medical examination without abnormal findings: Secondary | ICD-10-CM | POA: Diagnosis not present

## 2022-04-19 NOTE — Progress Notes (Signed)
Subjective:   [name] is a 88 y.o. female who presents for Medicare Annual (Subsequent) preventive examination.  I connected with  [name] on 04/19/22 by a audio enabled telemedicine application and verified that I am speaking with the correct person using two identifiers.  Patient Location: Home  Provider Location: Office/Clinic  I discussed the limitations of evaluation and management by telemedicine. The patient expressed understanding and agreed to proceed.  Review of Systems     Cardiac Risk Factors include: advanced age (>95mn, >>79women);dyslipidemia;hypertension;sedentary lifestyle;obesity (BMI >30kg/m2)     Objective:    Today's Vitals   04/19/22 1101  Weight: 161 lb (73 kg)  Height: '5\' 1"'$  (1.549 m)   Body mass index is 30.42 kg/m.     04/19/2022   11:12 AM 10/15/2021    3:00 PM 10/15/2021    4:00 AM 10/14/2021    6:02 PM 09/02/2021    1:51 PM 07/13/2021    3:40 AM 03/12/2021    6:24 PM  Advanced Directives  Does Patient Have a Medical Advance Directive? Yes No No No No No No  Type of Advance Directive Living will;Healthcare Power of Attorney        Does patient want to make changes to medical advance directive? No - Patient declined        Copy of HHendersonin Chart? No - copy requested        Would patient like information on creating a medical advance directive?  No - Patient declined No - Patient declined No - Patient declined No - Patient declined No - Patient declined No - Patient declined    Current Medications (verified) Outpatient Encounter Medications as of 04/19/2022  Medication Sig   acetaminophen (TYLENOL) 325 MG tablet Take 2 tablets (650 mg total) by mouth every 6 (six) hours.   albuterol (VENTOLIN HFA) 108 (90 Base) MCG/ACT inhaler Inhale 2 puffs into the lungs every 4 (four) hours as needed for wheezing or shortness of breath.   ammonium lactate (AMLACTIN) 12 % cream Apply topically as needed for dry skin.    aspirin EC 81 MG tablet Take 1 po BID x 6 weeks then resume 1 po daily   atenolol (TENORMIN) 25 MG tablet TAKE 1/2 TABLET BY MOUTH DAILY   Calcium Carbonate-Vitamin D3 600-400 MG-UNIT TABS Take 1 tablet by mouth daily.   cephALEXin (KEFLEX) 500 MG capsule Take 1 capsule (500 mg total) by mouth 3 (three) times daily.   clotrimazole-betamethasone (LOTRISONE) cream Apply 1 Application topically 2 (two) times daily.   diclofenac sodium (VOLTAREN) 1 % GEL Apply 2 g topically 4 (four) times daily. Rub into affected area of foot 2 to 4 times daily   DULoxetine (CYMBALTA) 30 MG capsule Take 1 capsule (30 mg total) by mouth daily.   DULoxetine (CYMBALTA) 60 MG capsule Take 1 capsule (60 mg total) by mouth daily.   ezetimibe (ZETIA) 10 MG tablet Take 1 tablet (10 mg total) by mouth daily.   gabapentin (NEURONTIN) 800 MG tablet TAKE 1 TABLET BY MOUTH THREE TIMES DAILY   ketoconazole (NIZORAL) 2 % cream Apply 1 application topically daily.   mupirocin ointment (BACTROBAN) 2 % Apply 1 application  topically 2 (two) times daily.   pantoprazole (PROTONIX) 40 MG tablet TAKE 1 TABLET BY MOUTH ONCE DAILY   polyethylene glycol (MIRALAX / GLYCOLAX) 17 g packet Take 17 g by mouth daily as needed for mild constipation or moderate constipation.   rosuvastatin (  CRESTOR) 40 MG tablet TAKE 1 TABLET BY MOUTH EACH EVENING   No facility-administered encounter medications on file as of 04/19/2022.    Allergies (verified) Sulfonamide derivatives   History: Past Medical History:  Diagnosis Date   [name] (abdominal aortic aneurysm) ([place]) 09/2013   3.6 cm   Acute and chronic respiratory failure with hypoxia (HCC)    Acute delirium    Acute encephalopathy    Acute pyelonephritis    Atrial fibrillation (HCC)    Chronic pain syndrome    Constipation    Dementia (HCC)    Dependent edema    bilateral legs    Depression    Diabetes mellitus    pre-diabetes   Diverticulosis    Diverticulosis    Dysuria    E coli  bacteremia    Fall    Gross hematuria    Hydronephrosis with renal and ureteral calculous obstruction    Hyperlipidemia    Hypertension    Hypoxemia    Idiopathic peripheral autonomic neuropathy    Infestation by bed bug    Nephrolithiasis    OSA on CPAP    Osteoarthritis    Osteopenia    T=  -2.1 in hip   Persistent mood (affective) disorder, unspecified (HCC)    Prediabetes    Rheumatoid arthritis (HCC)    Rheumatoid arthritis(714.0)    S/P carpal tunnel release    Sepsis secondary to UTI [facility])    Spinal stenosis    Unspecified fracture of fifth lumbar vertebra, initial encounter for closed fracture ([place])    Unspecified fracture of t11-T12 vertebra, initial encounter for closed fracture ([place])    Urinary incontinence    Urinary tract infection    hx of    Urolithiasis    Past Surgical History:  Procedure Laterality Date   APPENDECTOMY     CARPAL TUNNEL RELEASE  12/27/2006   Right subcutaneous ulnar nerve transfer -- Right open carpal tunnel release   CHOLECYSTECTOMY     CYSTOSCOPY W/ URETERAL STENT PLACEMENT Right 01/30/2018   Procedure: CYSTOSCOPY WITH STENT REPLACEMENT retrograde pylegram;  Surgeon: Ardis Hughs, MD;  Location: [facility];  Service: Urology;  Laterality: Right;   CYSTOSCOPY WITH URETEROSCOPY AND STENT PLACEMENT Right 03/01/2018   Procedure: CYSTOSCPY/RIGHT URETEROSCOPY HOLMIUM LASER LITHOTRIPSY AND STENT EXCHANGE;  Surgeon: Ardis Hughs, MD;  Location: [facility];  Service: Urology;  Laterality: Right;   FOOT SURGERY     HIP ARTHROPLASTY Left 08/27/2020   Procedure: ARTHROPLASTY BIPOLAR HIP (HEMIARTHROPLASTY);  Surgeon: Mordecai Rasmussen, MD;  Location: [facility];  Service: Orthopedics;  Laterality: Left;   HOLMIUM LASER APPLICATION Right 18/29/9371   Procedure: HOLMIUM LASER APPLICATION;  Surgeon: Ardis Hughs, MD;  Location: [facility];  Service: Urology;  Laterality: Right;   JOINT REPLACEMENT     BTKR, RTHR   TOTAL KNEE ARTHROPLASTY  08/21/2007    Right total knee replacement   Family History  Problem Relation Age of Onset   Stroke Father    Coronary artery disease Son        CABG at age 15   Heart disease Son    Heart attack Son    Stroke Mother    Hypertension Sister    Diabetes Brother    Hypertension Maternal Aunt    Social History   Socioeconomic History   Marital status: Divorced    Spouse name: Not on file   Number of children: 6   Years of education: 8th grade  Highest education level: Not on file  Occupational History   Occupation: Retired  Tobacco Use   Smoking status: Never   Smokeless tobacco: Never  Vaping Use   Vaping Use: Never used  Substance and Sexual Activity   Alcohol use: No   Drug use: No   Sexual activity: Not on file  Other Topics Concern   Not on file  Social History Narrative   Lives at home with 2 sons   She has four living children.   Right-handed.   Two cups caffeine per day.   Social Determinants of Health   Financial Resource Strain: Low Risk  (04/19/2022)   Overall Financial Resource Strain (CARDIA)    Difficulty of Paying Living Expenses: Not hard at all  Food Insecurity: No Food Insecurity (04/19/2022)   Hunger Vital Sign    Worried About Running Out of Food in the Last Year: Never true    Ran Out of Food in the Last Year: Never true  Transportation Needs: No Transportation Needs (04/19/2022)   PRAPARE - Hydrologist (Medical): No    Lack of Transportation (Non-Medical): No  Physical Activity: Inactive (04/19/2022)   Exercise Vital Sign    Days of Exercise per Week: 0 days    Minutes of Exercise per Session: 0 min  Stress: No Stress Concern Present (04/19/2022)   [place]    Feeling of Stress : Not at all  Social Connections: Socially Isolated (04/19/2022)   Social Connection and Isolation Panel [NHANES]    Frequency of Communication with Friends and Family: More than  three times a week    Frequency of Social Gatherings with Friends and Family: Three times a week    Attends Religious Services: Never    Active Member of Clubs or Organizations: No    Attends Music therapist: Never    Marital Status: Divorced    Tobacco Counseling Counseling given: Not Answered   Clinical Intake:  Pre-visit preparation completed: Yes  Pain : No/denies pain  Diabetes: No  How often do you need to have someone help you when you read instructions, pamphlets, or other written materials from your doctor or pharmacy?: 1 - Never  Diabetic?No  Interpreter Needed?: No  Comments: Assisted with appointment by son Bennie Hind Information entered by :: Denman George LPN   Activities of Daily Living    04/19/2022   11:12 AM 10/15/2021    3:00 PM  In your present state of health, do you have any difficulty performing the following activities:  Hearing? 1 1  Vision? 0 0  Difficulty concentrating or making decisions? 1 1  Walking or climbing stairs? 1 1  Dressing or bathing? 1 1  Doing errands, shopping? 1 1  Preparing Food and eating ? N   Comment Able to feed self   Using the Toilet? N   In the past six months, have you accidently leaked urine? Y   Do you have problems with loss of bowel control? N   Managing your Medications? Y   Managing your Finances? Y   Housekeeping or managing your Housekeeping? Y     Patient Care Team: [name], MD as PCP - General (Family Medicine) Stanford Breed Denice Bors, MD as PCP - Cardiology (Cardiology) Garrel Ridgel, DPM as Consulting Physician (Podiatry)  Indicate any recent Medical Services you may have received from other than [facility] providers in the past year (date may  be approximate).     Assessment:   This is a routine wellness examination for [name].  Hearing/Vision screen Hearing Screening - Comments:: No concerns at this time   Vision Screening - Comments:: No concerns at this time   Dietary  issues and exercise activities discussed: Current Exercise Habits: The patient does not participate in regular exercise at present   Goals Addressed             This Visit's Progress    Prevent falls   On track     Depression Screen    04/19/2022   11:10 AM 10/01/2021    3:42 PM 12/07/2020    4:01 PM 12/07/2020    3:35 PM 10/23/2020    9:01 AM 05/25/2020    2:09 PM 07/24/2017    9:30 AM  PHQ 2/9 Scores  PHQ - 2 Score 0 0 0 0 0 0 1  PHQ- 9 Score       3    Fall Risk    04/19/2022   11:06 AM 10/01/2021    3:42 PM 12/07/2020    3:35 PM 10/23/2020    9:00 AM 05/25/2020    2:09 PM  [place] in the past year? 0 0 '1 1 1  '$ Number falls in past yr: 0  '1 1 1  '$ Injury with Fall? 0  '1 1 1  '$ Risk for fall due to : Impaired balance/gait;Impaired mobility   Impaired balance/gait;History of fall(s);Impaired mobility   Follow up Falls evaluation completed;Education provided;Falls prevention discussed Falls evaluation completed  Falls evaluation completed;Falls prevention discussed Falls evaluation completed    FALL RISK PREVENTION PERTAINING TO THE HOME:  Any stairs in or around the home? No  If so, are there any without handrails? No  Home free of loose throw rugs in walkways, pet beds, electrical cords, etc? Yes  Adequate lighting in your home to reduce risk of falls? Yes   ASSISTIVE DEVICES UTILIZED TO PREVENT FALLS:  Life alert? No  Use of a cane, walker or w/c? Yes  Grab bars in the bathroom? Yes  Shower chair or bench in shower? Yes  Elevated toilet seat or a handicapped toilet? Yes   TIMED UP AND GO:  Was the test performed? No . Telephonic visit   Cognitive Function:    07/17/2019    2:01 PM 01/24/2019   11:00 AM  MMSE - Mini Mental State Exam  Orientation to time 4 4  Orientation to Place 4 4  Registration 3 3  Attention/ Calculation 0 3  Recall 0 1  Language- name 2 objects 2 2  Language- repeat 1 1  Language- follow 3 step command 3 3  Language- read &  follow direction 1 1  Write a sentence 0 0  Copy design 0 0  Total score 18 22        04/19/2022   11:13 AM  6CIT Screen  What Year? 4 points  What month? 0 points  What time? 0 points  Count back from 20 0 points  Months in reverse 4 points  Repeat phrase 6 points  Total Score 14 points    Immunizations Immunization History  Administered Date(s) Administered   Fluad Quad(high Dose 65+) 04/07/2020   Influenza Whole 02/17/2005, 03/15/2007, 02/21/2008   Influenza,inj,Quad PF,6+ Mos 01/11/2013, 06/17/2016, 01/05/2017   Influenza-Unspecified 02/06/2012   Pneumococcal Conjugate-13 08/23/2014   Pneumococcal Polysaccharide-23 07/23/2013   Zoster, Live 08/23/2014    TDAP status: Due, Education has  been provided regarding the importance of this vaccine. Advised may receive this vaccine at local pharmacy or Health Dept. Aware to provide a copy of the vaccination record if obtained from local pharmacy or Health Dept. Verbalized acceptance and understanding.  Flu Vaccine status: Due, Education has been provided regarding the importance of this vaccine. Advised may receive this vaccine at local pharmacy or Health Dept. Aware to provide a copy of the vaccination record if obtained from local pharmacy or Health Dept. Verbalized acceptance and understanding.  Pneumococcal vaccine status: Up to date  Covid-19 vaccine status: Information provided on how to obtain vaccines.   Qualifies for Shingles Vaccine? Yes   Zostavax completed No   Shingrix Completed?: No.    Education has been provided regarding the importance of this vaccine. Patient has been advised to call insurance company to determine out of pocket expense if they have not yet received this vaccine. Advised may also receive vaccine at local pharmacy or Health Dept. Verbalized acceptance and understanding.  Screening Tests Health Maintenance  Topic Date Due   COVID-19 Vaccine (1) Never done   DTaP/Tdap/Td (1 - Tdap) Never done    Zoster Vaccines- Shingrix (1 of 2) Never done   INFLUENZA VACCINE  11/30/2021   Medicare Annual Wellness (AWV)  04/20/2023   Pneumonia Vaccine 49+ Years old  Completed   DEXA SCAN  Completed   HPV VACCINES  Aged Out    Health Maintenance  Health Maintenance Due  Topic Date Due   COVID-19 Vaccine (1) Never done   DTaP/Tdap/Td (1 - Tdap) Never done   Zoster Vaccines- Shingrix (1 of 2) Never done   INFLUENZA VACCINE  11/30/2021    Colorectal cancer screening: No longer required.   Mammogram status: No longer required due to age.  Bone Density status: Completed 08/15/16. Results reflect: Bone density results: OSTEOPOROSIS. Repeat every as directed years.  Lung Cancer Screening: (Low Dose CT Chest recommended if Age 76-80 years, 30 pack-year currently smoking OR have quit w/in 15years.) does not qualify.   Lung Cancer Screening Referral: n/a  Additional Screening:  Hepatitis C Screening: does not qualify  Vision Screening: Recommended annual ophthalmology exams for early detection of glaucoma and other disorders of the eye. Is the patient up to date with their annual eye exam?  Yes  Who is the provider or what is the name of the office in which the patient attends annual eye exams? Unable to provide doctor's name  If pt is not established with a provider, would they like to be referred to a provider to establish care? No .   Dental Screening: Recommended annual dental exams for proper oral hygiene  Community Resource Referral / Chronic Care Management: CRR required this visit?  No   CCM required this visit?  No      Plan:     I have personally reviewed and noted the following in the patient's chart:   Medical and social history Use of alcohol, tobacco or illicit drugs  Current medications and supplements including opioid prescriptions. Patient is not currently taking opioid prescriptions. Functional ability and status Nutritional status Physical activity Advanced  directives List of other physicians Hospitalizations, surgeries, and ER visits in previous 12 months Vitals Screenings to include cognitive, depression, and falls Referrals and appointments  In addition, I have reviewed and discussed with patient certain preventive protocols, quality metrics, and best practice recommendations. A written personalized care plan for preventive services as well as general preventive health recommendations were provided  to patient.     Vanetta Mulders, LPN   [id number]   Due to this being a virtual visit, the after visit summary with patients personalized plan was offered to patient via mail or my-chart.  Per request, patient was mailed a copy of AVS  Nurse Notes: No concerns

## 2022-04-19 NOTE — Patient Instructions (Signed)
Ms. [name] , Thank you for taking time to come for your Medicare Wellness Visit. I appreciate your ongoing commitment to your health goals. Please review the following plan we discussed and let me know if I can assist you in the future.   These are the goals we discussed:  Goals      Patient Stated     I would like to be able to walk alone unassisted to go to the bathroom      Prevent falls     Track and Manage Heart Rate and Rhythm-Atrial Fibrillation     Timeframe:  Long-Range Goal Priority:  Medium Start Date:       12/07/2020                      Expected End Date:       06/09/2021                Follow Up Date -  01/22/2021   - check pulse (heart) rate once a day - make a plan to exercise regularly- continue chair exercises prescribed by physical therapist - make a plan to eat healthy - take medicine as prescribed  - look over educational material mailed to you- advanced directives and falls - please complete advanced directives- call RN care manager if any questions at [phone] - per primary doctor, if 02 saturations are >90% it is okay to use oxygen as needed, please contact your doctor if any questions   Why is this important?   Atrial fibrillation may have no symptoms. Sometimes the symptoms get worse or happen more often.  It is important to keep track of what your symptoms are and when they happen.  A change in symptoms is important to discuss with your doctor or nurse.  Being active and healthy eating will also help you manage your heart condition.     Notes:      Track and Manage My Blood Pressure-Hypertension     Timeframe:  Long-Range Goal Priority:  Medium Start Date:           12/07/2020                  Expected End Date:      06/09/2021                 Follow Up Date- 01/22/2021   - check blood pressure 3 times per week - choose a place to take my blood pressure (home, clinic or office, retail store) - write blood pressure results in a log or diary  - please  look at [employer] for blood pressure cuff - follow low salt diet- avoid salty snacks, fast food. - look over educational material mailed to you- low sodium diet - continue to do your chair exercises - please be careful when walking, keep pathways clear - referral for audiologist is being completed by your primary care doctor   Why is this important?   You won't feel high blood pressure, but it can still hurt your blood vessels.  High blood pressure can cause heart or kidney problems. It can also cause a stroke.  Making lifestyle changes like losing a little weight or eating less salt will help.  Checking your blood pressure at home and at different times of the day can help to control blood pressure.  If the doctor prescribes medicine remember to take it the way the doctor ordered.  Call the office if  you cannot afford the medicine or if there are questions about it.     Notes:         This is a list of the screening recommended for you and due dates:  Health Maintenance  Topic Date Due   COVID-19 Vaccine (1) Never done   Complete foot exam   Never done   DTaP/Tdap/Td vaccine (1 - Tdap) Never done   Zoster (Shingles) Vaccine (1 of 2) Never done   Hemoglobin A1C  06/29/2021   Flu Shot  11/30/2021   Eye exam for diabetics  03/05/2022   Medicare Annual Wellness Visit  04/20/2023   Pneumonia Vaccine  Completed   DEXA scan (bone density measurement)  Completed   HPV Vaccine  Aged Out    Advanced directives: Please bring a copy of your health care power of attorney and living will to the office to be added to your chart at your convenience.   Conditions/risks identified: Aim for 6-8 glasses of water and 5 servings of fruits and vegetables each day.   Next appointment: Follow up in one year for your annual wellness visit    Preventive Care 65 Years and Older, Female Preventive care refers to lifestyle choices and visits with your health care provider that can  promote health and wellness. What does preventive care include? A yearly physical exam. This is also called an annual well check. Dental exams once or twice a year. Routine eye exams. Ask your health care provider how often you should have your eyes checked. Personal lifestyle choices, including: Daily care of your teeth and gums. Regular physical activity. Eating a healthy diet. Avoiding tobacco and drug use. Limiting alcohol use. Practicing safe sex. Taking low-dose aspirin every day. Taking vitamin and mineral supplements as recommended by your health care provider. What happens during an annual well check? The services and screenings done by your health care provider during your annual well check will depend on your age, overall health, lifestyle risk factors, and family history of disease. Counseling  Your health care provider may ask you questions about your: Alcohol use. Tobacco use. Drug use. Emotional well-being. Home and relationship well-being. Sexual activity. Eating habits. History of falls. Memory and ability to understand (cognition). Work and work Statistician. Reproductive health. Screening  You may have the following tests or measurements: Height, weight, and BMI. Blood pressure. Lipid and cholesterol levels. These may be checked every 5 years, or more frequently if you are over 69 years old. Skin check. Lung cancer screening. You may have this screening every year starting at age 36 if you have a 30-pack-year history of smoking and currently smoke or have quit within the past 15 years. Fecal occult blood test (FOBT) of the stool. You may have this test every year starting at age 24. Flexible sigmoidoscopy or colonoscopy. You may have a sigmoidoscopy every 5 years or a colonoscopy every 10 years starting at age 27. Hepatitis C blood test. Hepatitis B blood test. Sexually transmitted disease (STD) testing. Diabetes screening. This is done by checking your  blood sugar (glucose) after you have not eaten for a while (fasting). You may have this done every 1-3 years. Bone density scan. This is done to screen for osteoporosis. You may have this done starting at age 40. Mammogram. This may be done every 1-2 years. Talk to your health care provider about how often you should have regular mammograms. Talk with your health care provider about your test results, treatment options, and if  necessary, the need for more tests. Vaccines  Your health care provider may recommend certain vaccines, such as: Influenza vaccine. This is recommended every year. Tetanus, diphtheria, and acellular pertussis (Tdap, Td) vaccine. You may need a Td booster every 10 years. Zoster vaccine. You may need this after age 42. Pneumococcal 13-valent conjugate (PCV13) vaccine. One dose is recommended after age 66. Pneumococcal polysaccharide (PPSV23) vaccine. One dose is recommended after age 49. Talk to your health care provider about which screenings and vaccines you need and how often you need them. This information is not intended to replace advice given to you by your health care provider. Make sure you discuss any questions you have with your health care provider. Document Released: 05/15/2015 Document Revised: 01/06/2016 Document Reviewed: 02/17/2015 Elsevier Interactive Patient Education  2017 [place] Prevention in the Home Falls can cause injuries. They can happen to people of all ages. There are many things you can do to make your home safe and to help prevent falls. What can I do on the outside of my home? Regularly fix the edges of walkways and driveways and fix any cracks. Remove anything that might make you trip as you walk through a door, such as a raised step or threshold. Trim any bushes or trees on the path to your home. Use bright outdoor lighting. Clear any walking paths of anything that might make someone trip, such as rocks or tools. Regularly  check to see if handrails are loose or broken. Make sure that both sides of any steps have handrails. Any raised decks and porches should have guardrails on the edges. Have any leaves, snow, or ice cleared regularly. Use sand or salt on walking paths during winter. Clean up any spills in your garage right away. This includes oil or grease spills. What can I do in the bathroom? Use night lights. Install grab bars by the toilet and in the tub and shower. Do not use towel bars as grab bars. Use non-skid mats or decals in the tub or shower. If you need to sit down in the shower, use a plastic, non-slip stool. Keep the floor dry. Clean up any water that spills on the floor as soon as it happens. Remove soap buildup in the tub or shower regularly. Attach bath mats securely with double-sided non-slip rug tape. Do not have throw rugs and other things on the floor that can make you trip. What can I do in the bedroom? Use night lights. Make sure that you have a light by your bed that is easy to reach. Do not use any sheets or blankets that are too big for your bed. They should not hang down onto the floor. Have a firm chair that has side arms. You can use this for support while you get dressed. Do not have throw rugs and other things on the floor that can make you trip. What can I do in the kitchen? Clean up any spills right away. Avoid walking on wet floors. Keep items that you use a lot in easy-to-reach places. If you need to reach something above you, use a strong step stool that has a grab bar. Keep electrical cords out of the way. Do not use floor polish or wax that makes floors slippery. If you must use wax, use non-skid floor wax. Do not have throw rugs and other things on the floor that can make you trip. What can I do with my stairs? Do not leave any items on  the stairs. Make sure that there are handrails on both sides of the stairs and use them. Fix handrails that are broken or loose.  Make sure that handrails are as long as the stairways. Check any carpeting to make sure that it is firmly attached to the stairs. Fix any carpet that is loose or worn. Avoid having throw rugs at the top or bottom of the stairs. If you do have throw rugs, attach them to the floor with carpet tape. Make sure that you have a light switch at the top of the stairs and the bottom of the stairs. If you do not have them, ask someone to add them for you. What else can I do to help prevent falls? Wear shoes that: Do not have high heels. Have rubber bottoms. Are comfortable and fit you well. Are closed at the toe. Do not wear sandals. If you use a stepladder: Make sure that it is fully opened. Do not climb a closed stepladder. Make sure that both sides of the stepladder are locked into place. Ask someone to hold it for you, if possible. Clearly mark and make sure that you can see: Any grab bars or handrails. First and last steps. Where the edge of each step is. Use tools that help you move around (mobility aids) if they are needed. These include: Canes. Walkers. Scooters. Crutches. Turn on the lights when you go into a dark area. Replace any light bulbs as soon as they burn out. Set up your furniture so you have a clear path. Avoid moving your furniture around. If any of your floors are uneven, fix them. If there are any pets around you, be aware of where they are. Review your medicines with your doctor. Some medicines can make you feel dizzy. This can increase your chance of falling. Ask your doctor what other things that you can do to help prevent falls. This information is not intended to replace advice given to you by your health care provider. Make sure you discuss any questions you have with your health care provider. Document Released: 02/12/2009 Document Revised: 09/24/2015 Document Reviewed: 05/23/2014 Elsevier Interactive Patient Education  2017 [employer].

## 2022-05-23 ENCOUNTER — Other Ambulatory Visit: Payer: Self-pay | Admitting: Family Medicine

## 2022-05-23 DIAGNOSIS — G894 Chronic pain syndrome: Secondary | ICD-10-CM

## 2022-05-24 NOTE — Telephone Encounter (Signed)
Patient will need an office visit before next refill. Requested Prescriptions  Pending Prescriptions Disp Refills   DULoxetine (CYMBALTA) 30 MG capsule [Pharmacy Med Name: DULOXETINE DR '30MG'$  CAP 30 Capsule] 90 capsule 0    Sig: TAKE 1 CAPSULE BY MOUTH DAILY     Psychiatry: Antidepressants - SNRI - duloxetine Failed - 05/23/2022  6:09 PM      Failed - Valid encounter within last 6 months    Recent Outpatient Visits           7 months ago History of CVA (cerebrovascular accident)   [place] [name], MD   1 year ago Asymptomatic bacteriuria   [place] [name], MD   1 year ago Dehydration   [facility] [name], MD   1 year ago Atrial fibrillation, chronic ([place])   [place] [name], MD   1 year ago Acute cystitis without hematuria   [place] Eulogio Bear, NP              Passed - Cr in normal range and within 360 days    Creat  Date Value Ref Range Status  02/23/2021 1.08 (H) 0.60 - 0.95 mg/dL Final   Creatinine, Ser  Date Value Ref Range Status  10/20/2021 0.82 0.44 - 1.00 mg/dL Final   Creatinine, Urine  Date Value Ref Range Status  03/08/2007 135.3 mg/dL Final         Passed - eGFR is 30 or above and within 360 days    GFR, Est African American  Date Value Ref Range Status  12/12/2018 66 > OR = 60 mL/min/1.56m Final   GFR calc Af Amer  Date Value Ref Range Status  07/10/2019 47 (L) >60 mL/min Final   GFR, Est Non African American  Date Value Ref Range Status  12/12/2018 57 (L) > OR = 60 mL/min/1.744mFinal   GFR, Estimated  Date Value Ref Range Status  10/20/2021 >60 >60 mL/min Final    Comment:    (NOTE) Calculated using the CKD-EPI Creatinine Equation (2021)    eGFR  Date Value Ref Range Status  02/23/2021 50 (L) > OR = 60 mL/min/1.7348minal    Comment:    The eGFR is based on the CKD-EPI 2021 equation. To  calculate  the new eGFR from a previous Creatinine or Cystatin C result, go to https://www.kidney.org/professionals/ kdoqi/gfr%[email or web address]          Passed - Completed PHQ-2 or PHQ-9 in the last 360 days      Passed - Last BP in normal range    BP Readings from Last 1 Encounters:  12/30/21 126/62          DULoxetine (CYMBALTA) 60 MG capsule [Pharmacy Med Name: DULOXETINE '60MG'$  CAPSULE 60 Capsule] 90 capsule 0    Sig: TAKE 1 CAPSULE BY MOUTH DAILY *TAKE IN ADDITION TO THE CYMBALTA 30 MG TO EQUAL A TOTAL OF 90 MG DAILY*     Psychiatry: Antidepressants - SNRI - duloxetine Failed - 05/23/2022  6:09 PM      Failed - Valid encounter within last 6 months    Recent Outpatient Visits           7 months ago History of CVA (cerebrovascular accident)   [facility]   1 year ago Asymptomatic bacteriuria   [place] [place] Mcgee[facility]  1 year ago Dehydration   [place] Pickard, [name], MD   1 year ago Atrial fibrillation, chronic ([place])   [facility] [name], MD   1 year ago Acute cystitis without hematuria   [place] Eulogio Bear, NP              Passed - Cr in normal range and within 360 days    Creat  Date Value Ref Range Status  02/23/2021 1.08 (H) 0.60 - 0.95 mg/dL Final   Creatinine, Ser  Date Value Ref Range Status  10/20/2021 0.82 0.44 - 1.00 mg/dL Final   Creatinine, Urine  Date Value Ref Range Status  03/08/2007 135.3 mg/dL Final         Passed - eGFR is 30 or above and within 360 days    GFR, Est African American  Date Value Ref Range Status  12/12/2018 66 > OR = 60 mL/min/1.100m Final   GFR calc Af Amer  Date Value Ref Range Status  07/10/2019 47 (L) >60 mL/min Final   GFR, Est Non African American  Date Value Ref Range Status  12/12/2018 57 (L) > OR = 60 mL/min/1.726mFinal   GFR, Estimated  Date Value Ref Range Status   10/20/2021 >60 >60 mL/min Final    Comment:    (NOTE) Calculated using the CKD-EPI Creatinine Equation (2021)    eGFR  Date Value Ref Range Status  02/23/2021 50 (L) > OR = 60 mL/min/1.7366minal    Comment:    The eGFR is based on the CKD-EPI 2021 equation. To calculate  the new eGFR from a previous Creatinine or Cystatin C result, go to https://www.kidney.org/professionals/ kdoqi/gfr%5Fcalculator          Passed - Completed PHQ-2 or PHQ-9 in the last 360 days      Passed - Last BP in normal range    BP Readings from Last 1 Encounters:  12/30/21 126/62

## 2022-06-21 ENCOUNTER — Ambulatory Visit: Payer: 59 | Admitting: Podiatry

## 2022-06-22 ENCOUNTER — Other Ambulatory Visit: Payer: Self-pay | Admitting: Family Medicine

## 2022-06-22 DIAGNOSIS — R8271 Bacteriuria: Secondary | ICD-10-CM

## 2022-06-23 NOTE — Telephone Encounter (Signed)
Courtesy refill. Patient will need an office visit for further refills. Requested Prescriptions  Pending Prescriptions Disp Refills   atenolol (TENORMIN) 25 MG tablet [Pharmacy Med Name: ATENOLOL 25 MG TABS 25 Tablet] 15 tablet 0    Sig: TAKE 1/2 TABLET BY MOUTH DAILY     Cardiovascular: Beta Blockers 2 Failed - 06/22/2022  6:12 PM      Failed - Valid encounter within last 6 months    Recent Outpatient Visits           8 months ago History of CVA (cerebrovascular accident)   [place] [name], MD   1 year ago Asymptomatic bacteriuria   [place] [name], MD   1 year ago Dehydration   [facility] [name], MD   1 year ago Atrial fibrillation, chronic ([place])   [facility] [name], MD   1 year ago Acute cystitis without hematuria   [place] Eulogio Bear, NP              Passed - Cr in normal range and within 360 days    Creat  Date Value Ref Range Status  02/23/2021 1.08 (H) 0.60 - 0.95 mg/dL Final   Creatinine, Ser  Date Value Ref Range Status  10/20/2021 0.82 0.44 - 1.00 mg/dL Final   Creatinine, Urine  Date Value Ref Range Status  03/08/2007 135.3 mg/dL Final         Passed - Last BP in normal range    BP Readings from Last 1 Encounters:  12/30/21 126/62         Passed - Last Heart Rate in normal range    Pulse Readings from Last 1 Encounters:  12/30/21 91

## 2022-06-23 NOTE — Telephone Encounter (Signed)
Called pt - left message on machine to call back and schedule office visit.

## 2022-07-01 ENCOUNTER — Encounter: Payer: Self-pay | Admitting: Family Medicine

## 2022-07-01 ENCOUNTER — Ambulatory Visit (INDEPENDENT_AMBULATORY_CARE_PROVIDER_SITE_OTHER): Payer: 59 | Admitting: Family Medicine

## 2022-07-01 VITALS — BP 124/72 | HR 66 | Temp 97.9°F | Ht 65.0 in | Wt 165.0 lb

## 2022-07-01 DIAGNOSIS — Z8673 Personal history of transient ischemic attack (TIA), and cerebral infarction without residual deficits: Secondary | ICD-10-CM | POA: Diagnosis not present

## 2022-07-01 DIAGNOSIS — G894 Chronic pain syndrome: Secondary | ICD-10-CM

## 2022-07-01 DIAGNOSIS — I10 Essential (primary) hypertension: Secondary | ICD-10-CM | POA: Diagnosis not present

## 2022-07-01 DIAGNOSIS — I482 Chronic atrial fibrillation, unspecified: Secondary | ICD-10-CM

## 2022-07-01 DIAGNOSIS — R7303 Prediabetes: Secondary | ICD-10-CM

## 2022-07-01 NOTE — Progress Notes (Signed)
Subjective:    Patient ID: [name], female    DOB: 1933-08-02, 89 y.o.   MRN: [id number]  HPI Patient is here today for follow-up of her chronic medical problems.  She has a history of a remote stroke, hypertension, hyperlipidemia, and prediabetes. 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  On MRI 2015, she was found to have: Advanced degenerative lumbar spondylosis with multilevel disc  disease and facet disease. Multilevel degenerative spondylolisthesis  most notable at L4 and L3. Severe facet disease but no definite pars  defects.  However, her son has been able to wean her away from all narcotics.  She is no longer taking oxycodone!  They also have a new caregiver for her at home who is giving her a bath twice a week.  Due to improved hygiene, she has not had a urinary tract infection since I last saw her. She denies any recent falls.  Son states that her memory loss has remained stable.  I suspect that she has underlying vascular dementia.  He denies any hallucinations.  He denies any sundowning.  He denies any delirium Past Medical History:  Diagnosis Date   [name] (abdominal aortic aneurysm) ([place]) 09/2013   3.6 cm   Acute and chronic respiratory failure with hypoxia (HCC)    Acute delirium    Acute encephalopathy    Acute pyelonephritis    Atrial fibrillation (HCC)    Chronic pain syndrome    Constipation    Dementia (HCC)    Dependent edema    bilateral legs    Depression    Diabetes mellitus    pre-diabetes   Diverticulosis    Diverticulosis    Dysuria    E coli bacteremia    Fall    Gross hematuria    Hydronephrosis with renal and ureteral calculous obstruction    Hyperlipidemia    Hypertension    Hypoxemia    Idiopathic peripheral autonomic neuropathy    Infestation by bed bug    Nephrolithiasis    OSA on CPAP    Osteoarthritis    Osteopenia    T=  -2.1 in hip   Persistent mood (affective) disorder, unspecified (HCC)    Prediabetes     Rheumatoid arthritis (HCC)    Rheumatoid arthritis(714.0)    S/P carpal tunnel release    Sepsis secondary to UTI [facility])    Spinal stenosis    Unspecified fracture of fifth lumbar vertebra, initial encounter for closed fracture ([place])    Unspecified fracture of t11-T12 vertebra, initial encounter for closed fracture ([place])    Urinary incontinence    Urinary tract infection    hx of    Urolithiasis    Past Surgical History:  Procedure Laterality Date   APPENDECTOMY     CARPAL TUNNEL RELEASE  12/27/2006   Right subcutaneous ulnar nerve transfer -- Right open carpal tunnel release   CHOLECYSTECTOMY     CYSTOSCOPY W/ URETERAL STENT PLACEMENT Right 01/30/2018   Procedure: CYSTOSCOPY WITH STENT REPLACEMENT retrograde pylegram;  Surgeon: Ardis Hughs, MD;  Location: [facility];  Service: Urology;  Laterality: Right;   CYSTOSCOPY WITH URETEROSCOPY AND STENT PLACEMENT Right 03/01/2018   Procedure: CYSTOSCPY/RIGHT URETEROSCOPY HOLMIUM LASER LITHOTRIPSY AND STENT EXCHANGE;  Surgeon: Ardis Hughs, MD;  Location: [facility];  Service: Urology;  Laterality: Right;   FOOT SURGERY     HIP ARTHROPLASTY Left 08/27/2020   Procedure: ARTHROPLASTY BIPOLAR HIP (HEMIARTHROPLASTY);  Surgeon: Amedeo Kinsman,  Jeannette How, MD;  Location: [facility];  Service: Orthopedics;  Laterality: Left;   HOLMIUM LASER APPLICATION Right [id number]   Procedure: HOLMIUM LASER APPLICATION;  Surgeon: Ardis Hughs, MD;  Location: [facility];  Service: Urology;  Laterality: Right;   JOINT REPLACEMENT     BTKR, RTHR   TOTAL KNEE ARTHROPLASTY  08/21/2007   Right total knee replacement   Current Outpatient Medications on File Prior to Visit  Medication Sig Dispense Refill   acetaminophen (TYLENOL) 325 MG tablet Take 2 tablets (650 mg total) by mouth every 6 (six) hours.     albuterol (VENTOLIN HFA) 108 (90 Base) MCG/ACT inhaler Inhale 2 puffs into the lungs every 4 (four) hours as needed for wheezing or shortness of breath.     ammonium  lactate (AMLACTIN) 12 % cream Apply topically as needed for dry skin. 385 g 0   aspirin EC 81 MG tablet Take 1 po BID x 6 weeks then resume 1 po daily 30 tablet 11   atenolol (TENORMIN) 25 MG tablet TAKE 1/2 TABLET BY MOUTH DAILY 15 tablet 0   Calcium Carbonate-Vitamin D3 600-400 MG-UNIT TABS Take 1 tablet by mouth daily.     cephALEXin (KEFLEX) 500 MG capsule Take 1 capsule (500 mg total) by mouth 3 (three) times daily. 21 capsule 0   clotrimazole-betamethasone (LOTRISONE) cream Apply 1 Application topically 2 (two) times daily. 30 g 0   diclofenac sodium (VOLTAREN) 1 % GEL Apply 2 g topically 4 (four) times daily. Rub into affected area of foot 2 to 4 times daily 100 g 2   DULoxetine (CYMBALTA) 30 MG capsule TAKE 1 CAPSULE BY MOUTH DAILY 90 capsule 0   DULoxetine (CYMBALTA) 60 MG capsule TAKE 1 CAPSULE BY MOUTH DAILY *TAKE IN ADDITION TO THE CYMBALTA 30 MG TO EQUAL A TOTAL OF 90 MG DAILY* 90 capsule 0   ezetimibe (ZETIA) 10 MG tablet Take 1 tablet (10 mg total) by mouth daily. 90 tablet 3   gabapentin (NEURONTIN) 800 MG tablet TAKE 1 TABLET BY MOUTH THREE TIMES DAILY 90 tablet 10   ketoconazole (NIZORAL) 2 % cream Apply 1 application topically daily.     mupirocin ointment (BACTROBAN) 2 % Apply 1 application  topically 2 (two) times daily.     pantoprazole (PROTONIX) 40 MG tablet TAKE 1 TABLET BY MOUTH ONCE DAILY 90 tablet 1   polyethylene glycol (MIRALAX / GLYCOLAX) 17 g packet Take 17 g by mouth daily as needed for mild constipation or moderate constipation.     rosuvastatin (CRESTOR) 40 MG tablet TAKE 1 TABLET BY MOUTH EACH EVENING 90 tablet 1   No current facility-administered medications on file prior to visit.   Allergies  Allergen Reactions   Sulfonamide Derivatives Hives and Rash   Social History   Socioeconomic History   Marital status: Divorced    Spouse name: Not on file   Number of children: 6   Years of education: 8th grade   Highest education level: Not on file   Occupational History   Occupation: Retired  Tobacco Use   Smoking status: Never   Smokeless tobacco: Never  Vaping Use   Vaping Use: Never used  Substance and Sexual Activity   Alcohol use: No   Drug use: No   Sexual activity: Not on file  Other Topics Concern   Not on file  Social History Narrative   Lives at home with 2 sons   She has four living children.   Right-handed.  Two cups caffeine per day.   Social Determinants of Health   Financial Resource Strain: Low Risk  (04/19/2022)   Overall Financial Resource Strain (CARDIA)    Difficulty of Paying Living Expenses: Not hard at all  Food Insecurity: No Food Insecurity (04/19/2022)   Hunger Vital Sign    Worried About Running Out of Food in the Last Year: Never true    Ran Out of Food in the Last Year: Never true  Transportation Needs: No Transportation Needs (04/19/2022)   PRAPARE - Hydrologist (Medical): No    Lack of Transportation (Non-Medical): No  Physical Activity: Inactive (04/19/2022)   Exercise Vital Sign    Days of Exercise per Week: 0 days    Minutes of Exercise per Session: 0 min  Stress: No Stress Concern Present (04/19/2022)   [place]    Feeling of Stress : Not at all  Social Connections: Socially Isolated (04/19/2022)   Social Connection and Isolation Panel [NHANES]    Frequency of Communication with Friends and Family: More than three times a week    Frequency of Social Gatherings with Friends and Family: Three times a week    Attends Religious Services: Never    Active Member of Clubs or Organizations: No    Attends Archivist Meetings: Never    Marital Status: Divorced  Human resources officer Violence: Not At Risk (04/19/2022)   Humiliation, Afraid, Rape, and Kick questionnaire    Fear of Current or Ex-Partner: No    Emotionally Abused: No    Physically Abused: No    Sexually Abused: No      Review of Systems  All other systems reviewed and are negative.      Objective:   Physical Exam Constitutional:      General: She is not in acute distress.    Appearance: Normal appearance. She is normal weight. She is not ill-appearing, toxic-appearing or diaphoretic.  Cardiovascular:     Rate and Rhythm: Normal rate. Rhythm irregular.     Heart sounds: Normal heart sounds. No murmur heard.    No gallop.  Pulmonary:     Effort: Pulmonary effort is normal. No tachypnea or respiratory distress.     Breath sounds: No wheezing or rhonchi.  Chest:     Chest wall: No tenderness or edema.  Abdominal:     General: There is no distension.     Tenderness: There is no abdominal tenderness. There is no guarding or rebound.  Musculoskeletal:     Right lower leg: No edema.     Left lower leg: No edema.  Neurological:     Mental Status: She is alert.           Assessment & Plan:  History of CVA (cerebrovascular accident) - Plan: Hemoglobin A1c, CBC with Differential/Platelet, COMPLETE METABOLIC PANEL WITH GFR, Lipid panel  Essential hypertension - Plan: Hemoglobin A1c, CBC with Differential/Platelet, COMPLETE METABOLIC PANEL WITH GFR, Lipid panel  Atrial fibrillation, chronic (HCC) - Plan: Hemoglobin A1c, CBC with Differential/Platelet, COMPLETE METABOLIC PANEL WITH GFR, Lipid panel  Chronic pain syndrome  Pre-diabetes - Plan: Hemoglobin A1c, CBC with Differential/Platelet, COMPLETE METABOLIC PANEL WITH GFR, Lipid panel Patient has a history of prediabetes.  Check a hemoglobin A1c.  Check baseline labs including a CBC and a CMP to monitor her renal function as well as to screen for any anemia.  Given her history of stroke, check a fasting lipid  panel.  Goal LDL cholesterol is less than 70.  Congratulated the patient on being able to stop her oxycodone.  Encourage deep breathing exercises to help with pneumonia prevention.  I am concerned that the patient sits the majority of the  day and breathes very shallow.  She has some persistent chronic crackles in both lung bases that I suspect are due to atelectasis

## 2022-07-02 LAB — CBC WITH DIFFERENTIAL/PLATELET
Absolute Monocytes: 396 cells/uL (ref 200–950)
Basophils Absolute: 78 cells/uL (ref 0–200)
Basophils Relative: 1.3 %
Eosinophils Absolute: 150 cells/uL (ref 15–500)
Eosinophils Relative: 2.5 %
HCT: 38.4 % (ref 35.0–45.0)
Hemoglobin: 12.1 g/dL (ref 11.7–15.5)
Lymphs Abs: 2076 cells/uL (ref 850–3900)
MCH: 28.1 pg (ref 27.0–33.0)
MCHC: 31.5 g/dL — ABNORMAL LOW (ref 32.0–36.0)
MCV: 89.1 fL (ref 80.0–100.0)
MPV: 10.5 fL (ref 7.5–12.5)
Monocytes Relative: 6.6 %
Neutro Abs: 3300 cells/uL (ref 1500–7800)
Neutrophils Relative %: 55 %
Platelets: 168 10*3/uL (ref 140–400)
RBC: 4.31 10*6/uL (ref 3.80–5.10)
RDW: 13.1 % (ref 11.0–15.0)
Total Lymphocyte: 34.6 %
WBC: 6 10*3/uL (ref 3.8–10.8)

## 2022-07-02 LAB — COMPLETE METABOLIC PANEL WITH GFR
AG Ratio: 1.5 (calc) (ref 1.0–2.5)
ALT: 9 U/L (ref 6–29)
AST: 15 U/L (ref 10–35)
Albumin: 3.9 g/dL (ref 3.6–5.1)
Alkaline phosphatase (APISO): 55 U/L (ref 37–153)
BUN/Creatinine Ratio: 17 (calc) (ref 6–22)
BUN: 18 mg/dL (ref 7–25)
CO2: 31 mmol/L (ref 20–32)
Calcium: 9.2 mg/dL (ref 8.6–10.4)
Chloride: 101 mmol/L (ref 98–110)
Creat: 1.05 mg/dL — ABNORMAL HIGH (ref 0.60–0.95)
Globulin: 2.6 g/dL (calc) (ref 1.9–3.7)
Glucose, Bld: 126 mg/dL — ABNORMAL HIGH (ref 65–99)
Potassium: 4.6 mmol/L (ref 3.5–5.3)
Sodium: 139 mmol/L (ref 135–146)
Total Bilirubin: 0.6 mg/dL (ref 0.2–1.2)
Total Protein: 6.5 g/dL (ref 6.1–8.1)
eGFR: 52 mL/min/{1.73_m2} — ABNORMAL LOW (ref >=60)

## 2022-07-02 LAB — LIPID PANEL
Cholesterol: 93 mg/dL (ref <200)
HDL: 48 mg/dL — ABNORMAL LOW (ref >=50)
LDL Cholesterol (Calc): 27 mg/dL (calc)
Non-HDL Cholesterol (Calc): 45 mg/dL (calc) (ref <130)
Total CHOL/HDL Ratio: 1.9 (calc) (ref <5.0)
Triglycerides: 98 mg/dL (ref <150)

## 2022-07-02 LAB — HEMOGLOBIN A1C
Hgb A1c MFr Bld: 5.8 % of total Hgb — ABNORMAL HIGH (ref <5.7)
Mean Plasma Glucose: 120 mg/dL
eAG (mmol/L): 6.6 mmol/L

## 2022-07-05 ENCOUNTER — Ambulatory Visit (INDEPENDENT_AMBULATORY_CARE_PROVIDER_SITE_OTHER): Payer: 59 | Admitting: Podiatry

## 2022-07-05 DIAGNOSIS — D2371 Other benign neoplasm of skin of right lower limb, including hip: Secondary | ICD-10-CM | POA: Diagnosis not present

## 2022-07-05 DIAGNOSIS — D2372 Other benign neoplasm of skin of left lower limb, including hip: Secondary | ICD-10-CM

## 2022-07-05 DIAGNOSIS — M79676 Pain in unspecified toe(s): Secondary | ICD-10-CM | POA: Diagnosis not present

## 2022-07-05 DIAGNOSIS — E1142 Type 2 diabetes mellitus with diabetic polyneuropathy: Secondary | ICD-10-CM | POA: Diagnosis not present

## 2022-07-05 DIAGNOSIS — B351 Tinea unguium: Secondary | ICD-10-CM | POA: Diagnosis not present

## 2022-07-05 MED ORDER — CLOTRIMAZOLE-BETAMETHASONE 1-0.05 % EX CREA: 1.0000 | TOPICAL_CREAM | Freq: Every day | CUTANEOUS | 0 refills | Status: DC

## 2022-07-05 NOTE — Progress Notes (Signed)
She presents today for follow-up of her routine footcare.  Objective: Vital signs stable oriented x 3 pulses are palpable bilateral.  She has vesicular tinea pedis to the left foot most likely associated with the toenails.  Assessment: Diabetic peripheral neuropathy with pain in limb secondary to onychomycosis and vesicular tinea pedis left.  Plan: Wrote a prescription for clotrimazole and debrided her toenails for her 1 through 5 left and 1 4 and 5 right.

## 2022-07-21 ENCOUNTER — Other Ambulatory Visit: Payer: Self-pay | Admitting: Family Medicine

## 2022-07-21 DIAGNOSIS — I6381 Other cerebral infarction due to occlusion or stenosis of small artery: Secondary | ICD-10-CM

## 2022-07-21 DIAGNOSIS — R8271 Bacteriuria: Secondary | ICD-10-CM

## 2022-07-22 NOTE — Telephone Encounter (Signed)
Last OV 07/01/22.  Requested Prescriptions  Pending Prescriptions Disp Refills   atenolol (TENORMIN) 25 MG tablet [Pharmacy Med Name: ATENOLOL 25 MG TABS 25 Tablet] 15 tablet 10    Sig: TAKE 1/2 TABLET BY MOUTH DAILY     Cardiovascular: Beta Blockers 2 Failed - 07/22/2022 10:07 AM      Failed - Cr in normal range and within 360 days    Creat  Date Value Ref Range Status  07/01/2022 1.05 (H) 0.60 - 0.95 mg/dL Final   Creatinine, Urine  Date Value Ref Range Status  03/08/2007 135.3 mg/dL Final         Failed - Valid encounter within last 6 months    Recent Outpatient Visits           9 months ago History of CVA (cerebrovascular accident)   [place] [name], MD   1 year ago Asymptomatic bacteriuria   [place] [name], MD   1 year ago Dehydration   [place] [facility] [name], MD   1 year ago Atrial fibrillation, chronic ([place])   [place] [facility] Dennard Schaumann, [name], MD   1 year ago Acute cystitis without hematuria   [place] Noemi Chapel A, NP              Passed - Last BP in normal range    BP Readings from Last 1 Encounters:  07/01/22 124/72         Passed - Last Heart Rate in normal range    Pulse Readings from Last 1 Encounters:  07/01/22 66          pantoprazole (PROTONIX) 40 MG tablet [Pharmacy Med Name: PANTOPRAZOLE DR 40MG  TAB 40 Tablet] 90 tablet 1    Sig: TAKE 1 TABLET BY MOUTH ONCE DAILY     Gastroenterology: Proton Pump Inhibitors Passed - 07/22/2022 10:07 AM      Passed - Valid encounter within last 12 months    Recent Outpatient Visits           9 months ago History of CVA (cerebrovascular accident)   [place] [name], MD   1 year ago Asymptomatic bacteriuria   [place] [name], MD   1 year ago Dehydration   [place] [name], MD   1 year ago  Atrial fibrillation, chronic ([place])   [place] [name], MD   1 year ago Acute cystitis without hematuria   [place], Jessica A, NP               rosuvastatin (CRESTOR) 40 MG tablet [Pharmacy Med Name: ROSUVASTATIN 40MG  TAB 40 Tablet] 90 tablet 1    Sig: TAKE 1 TABLET BY MOUTH IN THE EVENING     Cardiovascular:  Antilipid - Statins 2 Failed - 07/22/2022 10:07 AM      Failed - Cr in normal range and within 360 days    Creat  Date Value Ref Range Status  07/01/2022 1.05 (H) 0.60 - 0.95 mg/dL Final   Creatinine, Urine  Date Value Ref Range Status  03/08/2007 135.3 mg/dL Final         Failed - Lipid Panel in normal range within the last 12 months    Cholesterol  Date Value Ref Range Status  07/01/2022 93 <200 mg/dL Final   LDL Cholesterol (Calc)  Date Value Ref Range Status  07/01/2022 27 mg/dL (calc) Final    Comment:    Reference range: <100 . Desirable range <100 mg/dL for primary prevention;   <70 mg/dL for patients with CHD or diabetic patients  with > or = 2 CHD risk factors. Mar[place] Kitchen LDL-C is now calculated using the Martin-Hopkins  calculation, which is a validated novel method providing  better accuracy than the Friedewald equation in the  estimation of LDL-C.  Cresenciano Genre et al. Annamaria Helling. [id number]): 2061-2068  (http://education.[facility].com/faq/FAQ164)    HDL  Date Value Ref Range Status  07/01/2022 48 (L) > OR = 50 mg/dL Final   Triglycerides  Date Value Ref Range Status  07/01/2022 98 <150 mg/dL Final         Passed - Patient is not pregnant      Passed - Valid encounter within last 12 months    Recent Outpatient Visits           9 months ago History of CVA (cerebrovascular accident)   [place] [name], MD   1 year ago Asymptomatic bacteriuria   [place] [name], MD   1 year ago Dehydration   [place]  [name], MD   1 year ago Atrial fibrillation, chronic [facility])   [place] [facility] [name], MD   1 year ago Acute cystitis without hematuria   [place] Eulogio Bear, NP

## 2022-07-22 NOTE — Telephone Encounter (Signed)
Requested medication (s) are due for refill today: yes   Requested medication (s) are on the active medication list: yes   Last refill:  06/23/22 #15 0 refills  Future visit scheduled: no   Notes to clinic:  last OV 07/01/22. No refills remain. Pharmacy requesting 10 refills. How many refills do you want?     Requested Prescriptions  Pending Prescriptions Disp Refills   atenolol (TENORMIN) 25 MG tablet [Pharmacy Med Name: ATENOLOL 25 MG TABS 25 Tablet] 15 tablet 10    Sig: TAKE 1/2 TABLET BY MOUTH DAILY     Cardiovascular: Beta Blockers 2 Failed - 07/22/2022 10:07 AM      Failed - Cr in normal range and within 360 days    Creat  Date Value Ref Range Status  07/01/2022 1.05 (H) 0.60 - 0.95 mg/dL Final   Creatinine, Urine  Date Value Ref Range Status  03/08/2007 135.3 mg/dL Final         Failed - Valid encounter within last 6 months    Recent Outpatient Visits           9 months ago History of CVA (cerebrovascular accident)   [place] [name], MD   1 year ago Asymptomatic bacteriuria   [place] [name], MD   1 year ago Dehydration   [place] [facility] [name], MD   1 year ago Atrial fibrillation, chronic ([place])   [place] [facility] Dennard Schaumann, [name], MD   1 year ago Acute cystitis without hematuria   [place] Noemi Chapel A, NP              Passed - Last BP in normal range    BP Readings from Last 1 Encounters:  07/01/22 124/72         Passed - Last Heart Rate in normal range    Pulse Readings from Last 1 Encounters:  07/01/22 66         Signed Prescriptions Disp Refills   pantoprazole (PROTONIX) 40 MG tablet 90 tablet 1    Sig: TAKE 1 TABLET BY MOUTH ONCE DAILY     Gastroenterology: Proton Pump Inhibitors Passed - 07/22/2022 10:07 AM      Passed - Valid encounter within last 12 months    Recent Outpatient Visits           9 months ago  History of CVA (cerebrovascular accident)   [place] Pickard, [name], MD   1 year ago Asymptomatic bacteriuria   [place] Pickard, [name], MD   1 year ago Dehydration   [place] Dennard Schaumann, [name], MD   1 year ago Atrial fibrillation, chronic ([place])   [place] [name], MD   1 year ago Acute cystitis without hematuria   [place], Jessica A, NP               rosuvastatin (CRESTOR) 40 MG tablet 90 tablet 1    Sig: TAKE 1 TABLET BY MOUTH IN THE EVENING     Cardiovascular:  Antilipid - Statins 2 Failed - 07/22/2022 10:07 AM      Failed - Cr in normal range and within 360 days    Creat  Date Value Ref Range Status  07/01/2022 1.05 (H) 0.60 - 0.95 mg/dL Final   Creatinine, Urine  Date Value Ref Range Status  03/08/2007  135.3 mg/dL Final         Failed - Lipid Panel in normal range within the last 12 months    Cholesterol  Date Value Ref Range Status  07/01/2022 93 <200 mg/dL Final   LDL Cholesterol (Calc)  Date Value Ref Range Status  07/01/2022 27 mg/dL (calc) Final    Comment:    Reference range: <100 . Desirable range <100 mg/dL for primary prevention;   <70 mg/dL for patients with CHD or diabetic patients  with > or = 2 CHD risk factors. Mar[place] Kitchen LDL-C is now calculated using the Martin-Hopkins  calculation, which is a validated novel method providing  better accuracy than the Friedewald equation in the  estimation of LDL-C.  Cresenciano Genre et al. Annamaria Helling. [id number]): 2061-2068  (http://education.[facility].com/faq/FAQ164)    HDL  Date Value Ref Range Status  07/01/2022 48 (L) > OR = 50 mg/dL Final   Triglycerides  Date Value Ref Range Status  07/01/2022 98 <150 mg/dL Final         Passed - Patient is not pregnant      Passed - Valid encounter within last 12 months    Recent Outpatient Visits           9 months ago History of CVA  (cerebrovascular accident)   [place] [name], MD   1 year ago Asymptomatic bacteriuria   [place] [name], MD   1 year ago Dehydration   [place] [name], MD   1 year ago Atrial fibrillation, chronic [facility])   [place] [facility] [name], MD   1 year ago Acute cystitis without hematuria   [place] Eulogio Bear, NP

## 2022-07-27 ENCOUNTER — Other Ambulatory Visit: Payer: Self-pay | Admitting: Family Medicine

## 2022-07-27 DIAGNOSIS — R8271 Bacteriuria: Secondary | ICD-10-CM

## 2022-07-28 NOTE — Telephone Encounter (Signed)
Unable to refill per protocol, Rx request refilled 07/27/22.  Requested Prescriptions  Pending Prescriptions Disp Refills   atenolol (TENORMIN) 25 MG tablet [Pharmacy Med Name: ATENOLOL 25 MG TABS 25 Tablet] 15 tablet 10    Sig: TAKE 1/2 TABLET BY MOUTH DAILY     Cardiovascular: Beta Blockers 2 Failed - 07/27/2022  6:15 PM      Failed - Cr in normal range and within 360 days    Creat  Date Value Ref Range Status  07/01/2022 1.05 (H) 0.60 - 0.95 mg/dL Final   Creatinine, Urine  Date Value Ref Range Status  03/08/2007 135.3 mg/dL Final         Failed - Valid encounter within last 6 months    Recent Outpatient Visits           10 months ago History of CVA (cerebrovascular accident)   [place] [name], MD   1 year ago Asymptomatic bacteriuria   [place] [name], MD   1 year ago Dehydration   [place] [name], MD   1 year ago Atrial fibrillation, chronic [facility])   [place] Dennard Schaumann, [name], MD   1 year ago Acute cystitis without hematuria   [place] Noemi Chapel A, NP              Passed - Last BP in normal range    BP Readings from Last 1 Encounters:  07/01/22 124/72         Passed - Last Heart Rate in normal range    Pulse Readings from Last 1 Encounters:  07/01/22 66

## 2022-08-19 ENCOUNTER — Other Ambulatory Visit: Payer: Self-pay | Admitting: Family Medicine

## 2022-08-19 DIAGNOSIS — G894 Chronic pain syndrome: Secondary | ICD-10-CM

## 2022-08-22 NOTE — Telephone Encounter (Signed)
Requested Prescriptions  Pending Prescriptions Disp Refills   DULoxetine (CYMBALTA) 60 MG capsule [Pharmacy Med Name: DULOXETINE  CAPSULE 60 Capsule] 90 capsule 0    Sig: TAKE 1 CAPSULE BY MOUTH DAILY *TAKE IN ADDITION TO THE CYMBALTA 30 MG TO EQUAL A TOTAL OF 90 MG DAILY* *PATIENT NEEDS APPOINTMENT FOR FURTHER REFILLS*     Psychiatry: Antidepressants - SNRI - duloxetine Failed - 08/19/2022  6:03 PM      Failed - Cr in normal range and within 360 days    Creat  Date Value Ref Range Status  07/01/2022 1.05 (H) 0.60 - 0.95 mg/dL Final   Creatinine, Urine  Date Value Ref Range Status  03/08/2007 135.3 mg/dL Final         Failed - Valid encounter within last 6 months    Recent Outpatient Visits           10 months ago History of CVA (cerebrovascular accident)   [facility] Donita Brooks, MD   1 year ago Asymptomatic bacteriuria   [facility] Donita Brooks, MD   1 year ago Dehydration   [facility] Donita Brooks, MD   1 year ago Atrial fibrillation, chronic (HCC)   [facility] Donita Brooks, MD   1 year ago Acute cystitis without hematuria   [facility] Valentino Nose, NP              Passed - eGFR is 30 or above and within 360 days    GFR, Est African American  Date Value Ref Range Status  12/12/2018 66 > OR = 60 mL/min/1.37m2 Final   GFR calc Af Amer  Date Value Ref Range Status  07/10/2019 47 (L) >60 mL/min Final   GFR, Est Non African American  Date Value Ref Range Status  12/12/2018 57 (L) > OR = 60 mL/min/1.53m2 Final   GFR, Estimated  Date Value Ref Range Status  10/20/2021 >60 >60 mL/min Final    Comment:    (NOTE) Calculated using the CKD-EPI Creatinine Equation (2021)    eGFR  Date Value Ref Range Status  07/01/2022 52 (L) > OR = 60 mL/min/1.11m2 Final         Passed - Completed PHQ-2 or PHQ-9 in the last 360 days      Passed - Last BP in  normal range    BP Readings from Last 1 Encounters:  07/01/22 124/72          DULoxetine (CYMBALTA) 30 MG capsule [Pharmacy Med Name: DULOXETINE DR  CAP 30 Capsule] 90 capsule 0    Sig: TAKE 1 CAPSULE BY MOUTH DAILY     Psychiatry: Antidepressants - SNRI - duloxetine Failed - 08/19/2022  6:03 PM      Failed - Cr in normal range and within 360 days    Creat  Date Value Ref Range Status  07/01/2022 1.05 (H) 0.60 - 0.95 mg/dL Final   Creatinine, Urine  Date Value Ref Range Status  03/08/2007 135.3 mg/dL Final         Failed - Valid encounter within last 6 months    Recent Outpatient Visits           10 months ago History of CVA (cerebrovascular accident)   [facility] Donita Brooks, MD   1 year ago Asymptomatic bacteriuria   [facility] Donita Brooks, MD   1 year ago Dehydration  [facility] Pickard, Priscille Heidelberg, MD   1 year ago Atrial fibrillation, chronic [facility])   [facility] Tanya Nones, Priscille Heidelberg, MD   1 year ago Acute cystitis without hematuria   [facility] Valentino Nose, NP              Passed - eGFR is 30 or above and within 360 days    GFR, Est African American  Date Value Ref Range Status  12/12/2018 66 > OR = 60 mL/min/1.63m2 Final   GFR calc Af Amer  Date Value Ref Range Status  07/10/2019 47 (L) >60 mL/min Final   GFR, Est Non African American  Date Value Ref Range Status  12/12/2018 57 (L) > OR = 60 mL/min/1.5m2 Final   GFR, Estimated  Date Value Ref Range Status  10/20/2021 >60 >60 mL/min Final    Comment:    (NOTE) Calculated using the CKD-EPI Creatinine Equation (2021)    eGFR  Date Value Ref Range Status  07/01/2022 52 (L) > OR = 60 mL/min/1.73m2 Final         Passed - Completed PHQ-2 or PHQ-9 in the last 360 days      Passed - Last BP in normal range    BP Readings from Last 1 Encounters:  07/01/22 124/72

## 2022-08-24 ENCOUNTER — Other Ambulatory Visit: Payer: Self-pay | Admitting: Podiatry

## 2022-09-16 ENCOUNTER — Other Ambulatory Visit: Payer: Self-pay | Admitting: Family Medicine

## 2022-09-16 DIAGNOSIS — E782 Mixed hyperlipidemia: Secondary | ICD-10-CM

## 2022-09-19 NOTE — Telephone Encounter (Signed)
Requested Prescriptions  Pending Prescriptions Disp Refills   ezetimibe (ZETIA) 10 MG tablet [Pharmacy Med Name: EZETIMIBE 10 MG TABS 10 Tablet] 90 tablet 3    Sig: TAKE 1 TABLET (10 MG TOTAL) BY MOUTH DAILY.     Cardiovascular:  Antilipid - Sterol Transport Inhibitors Failed - 09/16/2022  6:10 PM      Failed - Lipid Panel in normal range within the last 12 months    Cholesterol  Date Value Ref Range Status  07/01/2022 93 <200 mg/dL Final   LDL Cholesterol (Calc)  Date Value Ref Range Status  07/01/2022 27 mg/dL (calc) Final    Comment:    Reference range: <100 . Desirable range <100 mg/dL for primary prevention;   <70 mg/dL for patients with CHD or diabetic patients  with > or = 2 CHD risk factors. Marland Kitchen LDL-C is now calculated using the Martin-Hopkins  calculation, which is a validated novel method providing  better accuracy than the Friedewald equation in the  estimation of LDL-C.  Horald Pollen et al. Lenox Ahr. 1610;960(45): 2061-2068  (http://education.[facility].com/faq/FAQ164)    HDL  Date Value Ref Range Status  07/01/2022 48 (L) > OR = 50 mg/dL Final   Triglycerides  Date Value Ref Range Status  07/01/2022 98 <150 mg/dL Final         Passed - AST in normal range and within 360 days    AST  Date Value Ref Range Status  07/01/2022 15 10 - 35 U/L Final         Passed - ALT in normal range and within 360 days    ALT  Date Value Ref Range Status  07/01/2022 9 6 - 29 U/L Final         Passed - Patient is not pregnant      Passed - Valid encounter within last 12 months    Recent Outpatient Visits           11 months ago History of CVA (cerebrovascular accident)   [facility] Donita Brooks, MD   1 year ago Asymptomatic bacteriuria   [facility] Donita Brooks, MD   1 year ago Dehydration   [facility] Donita Brooks, MD   1 year ago Atrial fibrillation, chronic (HCC)   [facility]  Pickard, Priscille Heidelberg, MD   1 year ago Acute cystitis without hematuria   [facility] Valentino Nose, NP

## 2022-10-04 ENCOUNTER — Ambulatory Visit: Payer: 59 | Admitting: Podiatry

## 2022-10-18 ENCOUNTER — Ambulatory Visit (INDEPENDENT_AMBULATORY_CARE_PROVIDER_SITE_OTHER): Payer: 59 | Admitting: Podiatry

## 2022-10-18 DIAGNOSIS — D2372 Other benign neoplasm of skin of left lower limb, including hip: Secondary | ICD-10-CM

## 2022-10-18 DIAGNOSIS — E1142 Type 2 diabetes mellitus with diabetic polyneuropathy: Secondary | ICD-10-CM

## 2022-10-18 DIAGNOSIS — M79676 Pain in unspecified toe(s): Secondary | ICD-10-CM

## 2022-10-18 DIAGNOSIS — B351 Tinea unguium: Secondary | ICD-10-CM | POA: Diagnosis not present

## 2022-10-18 DIAGNOSIS — D2371 Other benign neoplasm of skin of right lower limb, including hip: Secondary | ICD-10-CM | POA: Diagnosis not present

## 2022-10-18 NOTE — Progress Notes (Signed)
She presents today with her son chief complaint of painful elongated toenails 1 through 5 left 1 4 and 5 on the right foot.  Objective: Vital signs stable alert oriented x 3 there is no erythema edema salines drainage odor toenails are long thick yellow dystrophic with mycotic  Assessment pain limb secondary to onychomycosis.  Plan: Debridement of toenails 1 through 5 bilateral.

## 2022-10-19 ENCOUNTER — Other Ambulatory Visit: Payer: Self-pay | Admitting: Family Medicine

## 2022-10-20 NOTE — Telephone Encounter (Signed)
Requested Prescriptions  Pending Prescriptions Disp Refills   gabapentin (NEURONTIN) 800 MG tablet [Pharmacy Med Name: GABAPENTIN 800 MG TABS 800 Tablet] 90 tablet 9    Sig: TAKE 1 TABLET BY MOUTH 3 TIMES DAILY     Neurology: Anticonvulsants - gabapentin Failed - 10/20/2022  8:18 AM      Failed - Cr in normal range and within 360 days    Creat  Date Value Ref Range Status  07/01/2022 1.05 (H) 0.60 - 0.95 mg/dL Final   Creatinine, Urine  Date Value Ref Range Status  03/08/2007 135.3 mg/dL Final         Failed - Valid encounter within last 12 months    Recent Outpatient Visits           1 year ago History of CVA (cerebrovascular accident)   [facility] Donita Brooks, MD   1 year ago Asymptomatic bacteriuria   [facility] Donita Brooks, MD   1 year ago Dehydration   [facility] Donita Brooks, MD   1 year ago Atrial fibrillation, chronic (HCC)   [facility] Donita Brooks, MD   2 years ago Acute cystitis without hematuria   [facility] Valentino Nose, NP              Passed - Completed PHQ-2 or PHQ-9 in the last 360 days

## 2022-11-18 ENCOUNTER — Other Ambulatory Visit: Payer: Self-pay | Admitting: Family Medicine

## 2022-11-18 DIAGNOSIS — G894 Chronic pain syndrome: Secondary | ICD-10-CM

## 2022-11-21 NOTE — Telephone Encounter (Signed)
Last OV 07/01/22, within protocol.  Requested Prescriptions  Pending Prescriptions Disp Refills   DULoxetine (CYMBALTA) 30 MG capsule [Pharmacy Med Name: DULOXETINE DR 30MG  CAP 30 Capsule] 90 capsule 0    Sig: TAKE 1 CAPSULE BY MOUTH DAILY     Psychiatry: Antidepressants - SNRI - duloxetine Failed - 11/18/2022  6:04 PM      Failed - Cr in normal range and within 360 days    Creat  Date Value Ref Range Status  07/01/2022 1.05 (H) 0.60 - 0.95 mg/dL Final   Creatinine, Urine  Date Value Ref Range Status  03/08/2007 135.3 mg/dL Final         Failed - Valid encounter within last 6 months    Recent Outpatient Visits           1 year ago History of CVA (cerebrovascular accident)   [facility] Donita Brooks, MD   1 year ago Asymptomatic bacteriuria   [facility] Donita Brooks, MD   1 year ago Dehydration   [facility] Donita Brooks, MD   1 year ago Atrial fibrillation, chronic (HCC)   [facility] Donita Brooks, MD   2 years ago Acute cystitis without hematuria   [facility] Valentino Nose, NP              Passed - eGFR is 30 or above and within 360 days    GFR, Est African American  Date Value Ref Range Status  12/12/2018 66 > OR = 60 mL/min/1.89m2 Final   GFR calc Af Amer  Date Value Ref Range Status  07/10/2019 47 (L) >60 mL/min Final   GFR, Est Non African American  Date Value Ref Range Status  12/12/2018 57 (L) > OR = 60 mL/min/1.68m2 Final   GFR, Estimated  Date Value Ref Range Status  10/20/2021 >60 >60 mL/min Final    Comment:    (NOTE) Calculated using the CKD-EPI Creatinine Equation (2021)    eGFR  Date Value Ref Range Status  07/01/2022 52 (L) > OR = 60 mL/min/1.60m2 Final         Passed - Completed PHQ-2 or PHQ-9 in the last 360 days      Passed - Last BP in normal range    BP Readings from Last 1 Encounters:  07/01/22 124/72           DULoxetine (CYMBALTA) 60 MG capsule [Pharmacy Med Name: DULOXETINE 60MG  CAPSULE 60 Capsule] 90 capsule 0    Sig: TAKE 1 CAPSULE BY MOUTH DAILY *TAKE IN ADDITION TO THE CYMBALTA 30 MG TO EQUAL A TOTAL OF 90 MG DAILY. *PATIENT NEEDS APPOINTMENT FOR FURTHER REFILLS*     Psychiatry: Antidepressants - SNRI - duloxetine Failed - 11/18/2022  6:04 PM      Failed - Cr in normal range and within 360 days    Creat  Date Value Ref Range Status  07/01/2022 1.05 (H) 0.60 - 0.95 mg/dL Final   Creatinine, Urine  Date Value Ref Range Status  03/08/2007 135.3 mg/dL Final         Failed - Valid encounter within last 6 months    Recent Outpatient Visits           1 year ago History of CVA (cerebrovascular accident)   [facility] Donita Brooks, MD   1 year ago Asymptomatic bacteriuria   [facility] Pickard, Priscille Heidelberg, MD  1 year ago Dehydration   [facility] Pickard, Priscille Heidelberg, MD   1 year ago Atrial fibrillation, chronic (HCC)   [facility] Donita Brooks, MD   2 years ago Acute cystitis without hematuria   [facility] Valentino Nose, NP              Passed - eGFR is 30 or above and within 360 days    GFR, Est African American  Date Value Ref Range Status  12/12/2018 66 > OR = 60 mL/min/1.39m2 Final   GFR calc Af Amer  Date Value Ref Range Status  07/10/2019 47 (L) >60 mL/min Final   GFR, Est Non African American  Date Value Ref Range Status  12/12/2018 57 (L) > OR = 60 mL/min/1.51m2 Final   GFR, Estimated  Date Value Ref Range Status  10/20/2021 >60 >60 mL/min Final    Comment:    (NOTE) Calculated using the CKD-EPI Creatinine Equation (2021)    eGFR  Date Value Ref Range Status  07/01/2022 52 (L) > OR = 60 mL/min/1.45m2 Final         Passed - Completed PHQ-2 or PHQ-9 in the last 360 days      Passed - Last BP in normal range    BP Readings from Last 1 Encounters:  07/01/22  124/72

## 2023-01-03 LAB — HM DIABETES EYE EXAM

## 2023-01-05 ENCOUNTER — Ambulatory Visit (INDEPENDENT_AMBULATORY_CARE_PROVIDER_SITE_OTHER): Payer: 59 | Admitting: Family Medicine

## 2023-01-05 VITALS — BP 126/80 | HR 87 | Temp 98.5°F | Ht 65.0 in | Wt 169.0 lb

## 2023-01-05 DIAGNOSIS — R1319 Other dysphagia: Secondary | ICD-10-CM | POA: Diagnosis not present

## 2023-01-05 NOTE — Progress Notes (Signed)
Subjective:    Patient ID: [name], female    DOB: Jun 15, 1933, 89 y.o.   MRN: [id number]  HPI Patient has a history of a remote stroke, hypertension, hyperlipidemia, and prediabetes. 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  She also has mild vascular dementia. Over the last 12 months, she reports progressive dysphagia.  Her son states that whenever she eats, food feels like it is getting stuck halfway down her throat.  It is especially worse with meat and chicken.  However it is gotten to the point now will bread is starting to cause her problems.  The food will get stuck, she will have to call for follow-up of food.  She denies any has pain.  She denies any dysphagia with liquids.  She denies any hemoptysis or hematemesis. Past Medical History:  Diagnosis Date   [name] (abdominal aortic aneurysm) (HCC) 09/2013   3.6 cm   Acute and chronic respiratory failure with hypoxia (HCC)    Acute delirium    Acute encephalopathy    Acute pyelonephritis    Atrial fibrillation (HCC)    Chronic pain syndrome    Constipation    Dementia (HCC)    Dependent edema    bilateral legs    Depression    Diabetes mellitus    pre-diabetes   Diverticulosis    Diverticulosis    Dysuria    E coli bacteremia    Fall    Gross hematuria    Hydronephrosis with renal and ureteral calculous obstruction    Hyperlipidemia    Hypertension    Hypoxemia    Idiopathic peripheral autonomic neuropathy    Infestation by bed bug    Nephrolithiasis    OSA on CPAP    Osteoarthritis    Osteopenia    T=  -2.1 in hip   Persistent mood (affective) disorder, unspecified (HCC)    Prediabetes    Rheumatoid arthritis (HCC)    Rheumatoid arthritis(714.0)    S/P carpal tunnel release    Sepsis secondary to UTI [facility])    Spinal stenosis    Unspecified fracture of fifth lumbar vertebra, initial encounter for closed fracture (HCC)    Unspecified fracture of t11-T12 vertebra, initial encounter for  closed fracture (HCC)    Urinary incontinence    Urinary tract infection    hx of    Urolithiasis    Past Surgical History:  Procedure Laterality Date   APPENDECTOMY     CARPAL TUNNEL RELEASE  12/27/2006   Right subcutaneous ulnar nerve transfer -- Right open carpal tunnel release   CHOLECYSTECTOMY     CYSTOSCOPY W/ URETERAL STENT PLACEMENT Right 01/30/2018   Procedure: CYSTOSCOPY WITH STENT REPLACEMENT retrograde pylegram;  Surgeon: Crist Fat, MD;  Location: [facility];  Service: Urology;  Laterality: Right;   CYSTOSCOPY WITH URETEROSCOPY AND STENT PLACEMENT Right 03/01/2018   Procedure: CYSTOSCPY/RIGHT URETEROSCOPY HOLMIUM LASER LITHOTRIPSY AND STENT EXCHANGE;  Surgeon: Crist Fat, MD;  Location: [facility];  Service: Urology;  Laterality: Right;   FOOT SURGERY     HIP ARTHROPLASTY Left 08/27/2020   Procedure: ARTHROPLASTY BIPOLAR HIP (HEMIARTHROPLASTY);  Surgeon: Oliver Barre, MD;  Location: [facility];  Service: Orthopedics;  Laterality: Left;   HOLMIUM LASER APPLICATION Right 03/01/2018   Procedure: HOLMIUM LASER APPLICATION;  Surgeon: Crist Fat, MD;  Location: [facility];  Service: Urology;  Laterality: Right;   JOINT REPLACEMENT     BTKR, RTHR  TOTAL KNEE ARTHROPLASTY  08/21/2007   Right total knee replacement   Current Outpatient Medications on File Prior to Visit  Medication Sig Dispense Refill   acetaminophen (TYLENOL) 325 MG tablet Take 2 tablets (650 mg total) by mouth every 6 (six) hours.     albuterol (VENTOLIN HFA) 108 (90 Base) MCG/ACT inhaler Inhale 2 puffs into the lungs every 4 (four) hours as needed for wheezing or shortness of breath.     ammonium lactate (AMLACTIN) 12 % cream Apply topically as needed for dry skin. 385 g 0   aspirin EC 81 MG tablet Take 1 po BID x 6 weeks then resume 1 po daily 30 tablet 11   atenolol (TENORMIN) 25 MG tablet TAKE 1/2 TABLET BY MOUTH DAILY 15 tablet 10   Calcium Carbonate-Vitamin D3 600-400 MG-UNIT TABS Take 1  tablet by mouth daily.     clotrimazole-betamethasone (LOTRISONE) cream APPLY 1 APPLICATION TOPICALLY DAILY 30 g 0   diclofenac sodium (VOLTAREN) 1 % GEL Apply 2 g topically 4 (four) times daily. Rub into affected area of foot 2 to 4 times daily 100 g 2   DULoxetine (CYMBALTA) 30 MG capsule TAKE 1 CAPSULE BY MOUTH DAILY 90 capsule 0   DULoxetine (CYMBALTA) 60 MG capsule TAKE 1 CAPSULE BY MOUTH DAILY *TAKE IN ADDITION TO THE CYMBALTA 30 MG TO EQUAL A TOTAL OF 90 MG DAILY. *PATIENT NEEDS APPOINTMENT FOR FURTHER REFILLS* 90 capsule 0   ezetimibe (ZETIA) 10 MG tablet TAKE 1 TABLET (10 MG TOTAL) BY MOUTH DAILY. 90 tablet 3   gabapentin (NEURONTIN) 800 MG tablet TAKE 1 TABLET BY MOUTH 3 TIMES DAILY 90 tablet 5   ketoconazole (NIZORAL) 2 % cream Apply 1 application topically daily.     mupirocin ointment (BACTROBAN) 2 % Apply 1 application  topically 2 (two) times daily.     pantoprazole (PROTONIX) 40 MG tablet TAKE 1 TABLET BY MOUTH ONCE DAILY 90 tablet 1   polyethylene glycol (MIRALAX / GLYCOLAX) 17 g packet Take 17 g by mouth daily as needed for mild constipation or moderate constipation.     rosuvastatin (CRESTOR) 40 MG tablet TAKE 1 TABLET BY MOUTH IN THE EVENING 90 tablet 1   No current facility-administered medications on file prior to visit.   Allergies  Allergen Reactions   Sulfonamide Derivatives Hives and Rash   Social History   Socioeconomic History   Marital status: Divorced    Spouse name: Not on file   Number of children: 6   Years of education: 8th grade   Highest education level: Not on file  Occupational History   Occupation: Retired  Tobacco Use   Smoking status: Never   Smokeless tobacco: Never  Vaping Use   Vaping status: Never Used  Substance and Sexual Activity   Alcohol use: No   Drug use: No   Sexual activity: Not on file  Other Topics Concern   Not on file  Social History Narrative   Lives at home with 2 sons   She has four living children.    Right-handed.   Two cups caffeine per day.   Social Determinants of Health   Financial Resource Strain: Low Risk  (04/19/2022)   Overall Financial Resource Strain (CARDIA)    Difficulty of Paying Living Expenses: Not hard at all  Food Insecurity: No Food Insecurity (04/19/2022)   Hunger Vital Sign    Worried About Running Out of Food in the Last Year: Never true    Ran Out of  Food in the Last Year: Never true  Transportation Needs: No Transportation Needs (04/19/2022)   PRAPARE - Administrator, Civil Service (Medical): No    Lack of Transportation (Non-Medical): No  Physical Activity: Inactive (04/19/2022)   Exercise Vital Sign    Days of Exercise per Week: 0 days    Minutes of Exercise per Session: 0 min  Stress: No Stress Concern Present (04/19/2022)   [employer] of Occupational Health - Occupational Stress Questionnaire    Feeling of Stress : Not at all  Social Connections: Socially Isolated (04/19/2022)   Social Connection and Isolation Panel [NHANES]    Frequency of Communication with Friends and Family: More than three times a week    Frequency of Social Gatherings with Friends and Family: Three times a week    Attends Religious Services: Never    Active Member of Clubs or Organizations: No    Attends Banker Meetings: Never    Marital Status: Divorced  Catering manager Violence: Not At Risk (04/19/2022)   Humiliation, Afraid, Rape, and Kick questionnaire    Fear of Current or Ex-Partner: No    Emotionally Abused: No    Physically Abused: No    Sexually Abused: No     Review of Systems  All other systems reviewed and are negative.      Objective:   Physical Exam Constitutional:      General: She is not in acute distress.    Appearance: Normal appearance. She is normal weight. She is not ill-appearing, toxic-appearing or diaphoretic.  Cardiovascular:     Rate and Rhythm: Normal rate. Rhythm irregular.     Heart sounds: Normal  heart sounds. No murmur heard.    No gallop.  Pulmonary:     Effort: Pulmonary effort is normal. No tachypnea or respiratory distress.     Breath sounds: No wheezing or rhonchi.  Chest:     Chest wall: No tenderness or edema.  Abdominal:     General: There is no distension.     Tenderness: There is no abdominal tenderness. There is no guarding or rebound.  Musculoskeletal:     Right lower leg: No edema.     Left lower leg: No edema.  Neurological:     Mental Status: She is alert.           Assessment & Plan:  Esophageal dysphagia - Plan: Ambulatory referral to Gastroenterology Symptoms sound like the patient may have developed an esophageal stricture.  We discussed performing a barium swallow to confirm versus a GI consultation to discuss the EGD.  I have recommended talking to the gastroenterologist about performing an EGD as this test would confirm the diagnosis and allow them to fix the problem simultaneously.

## 2023-01-17 ENCOUNTER — Ambulatory Visit (INDEPENDENT_AMBULATORY_CARE_PROVIDER_SITE_OTHER): Payer: 59 | Admitting: Gastroenterology

## 2023-01-17 ENCOUNTER — Encounter: Payer: Self-pay | Admitting: Gastroenterology

## 2023-01-17 VITALS — BP 121/77 | HR 90 | Temp 98.7°F | Ht 65.0 in | Wt 160.4 lb

## 2023-01-17 DIAGNOSIS — R1310 Dysphagia, unspecified: Secondary | ICD-10-CM | POA: Diagnosis not present

## 2023-01-17 NOTE — H&P (View-Only) (Signed)
GI Office Note    Referring Provider: Donita Brooks, MD Primary Care Physician:  Donita Brooks, MD  Primary Gastroenterologist: Roetta Sessions, MD   Chief Complaint   Chief Complaint  Patient presents with   Dysphagia    Food is getting stuck causing her to get choked     History of Present Illness   [name] is a 89 y.o. female presenting today for dysphagia at the request of Dr. Tanya Nones.  Patient presents with her son, [name].  She has baseline dementia.  He provides majority of the history for her.  He has noticed that she tends to have issues swallowing solid foods over the past one year, gradually worsening. Can happen with hard or soft food. She will cough and bring food up at times.  Never seems to be in distress or trying to catch her breath.  Does not seem to complain of reflux, heartburn, abdominal pain.  No unintentional weight loss. No teeth/dentures. No weight loss. BM regular. No melena, brbpr.   No known EGD/TCS.   Medications   Current Outpatient Medications  Medication Sig Dispense Refill   acetaminophen (TYLENOL) 325 MG tablet Take 2 tablets (650 mg total) by mouth every 6 (six) hours.     albuterol (VENTOLIN HFA) 108 (90 Base) MCG/ACT inhaler Inhale 2 puffs into the lungs every 4 (four) hours as needed for wheezing or shortness of breath.     ammonium lactate (AMLACTIN) 12 % cream Apply topically as needed for dry skin. 385 g 0   aspirin EC 81 MG tablet Take 1 po BID x 6 weeks then resume 1 po daily 30 tablet 11   atenolol (TENORMIN) 25 MG tablet TAKE 1/2 TABLET BY MOUTH DAILY 15 tablet 10   Calcium Carbonate-Vitamin D3 600-400 MG-UNIT TABS Take 1 tablet by mouth daily.     clotrimazole-betamethasone (LOTRISONE) cream APPLY 1 APPLICATION TOPICALLY DAILY 30 g 0   diclofenac sodium (VOLTAREN) 1 % GEL Apply 2 g topically 4 (four) times daily. Rub into affected area of foot 2 to 4 times daily 100 g 2   DULoxetine (CYMBALTA) 30 MG capsule TAKE  1 CAPSULE BY MOUTH DAILY 90 capsule 0   DULoxetine (CYMBALTA) 60 MG capsule TAKE 1 CAPSULE BY MOUTH DAILY *TAKE IN ADDITION TO THE CYMBALTA 30 MG TO EQUAL A TOTAL OF 90 MG DAILY. *PATIENT NEEDS APPOINTMENT FOR FURTHER REFILLS* 90 capsule 0   ezetimibe (ZETIA) 10 MG tablet TAKE 1 TABLET (10 MG TOTAL) BY MOUTH DAILY. 90 tablet 3   gabapentin (NEURONTIN) 800 MG tablet TAKE 1 TABLET BY MOUTH 3 TIMES DAILY 90 tablet 5   ketoconazole (NIZORAL) 2 % cream Apply 1 application topically daily.     mupirocin ointment (BACTROBAN) 2 % Apply 1 application  topically 2 (two) times daily.     pantoprazole (PROTONIX) 40 MG tablet TAKE 1 TABLET BY MOUTH ONCE DAILY 90 tablet 1   polyethylene glycol (MIRALAX / GLYCOLAX) 17 g packet Take 17 g by mouth daily as needed for mild constipation or moderate constipation.     rosuvastatin (CRESTOR) 40 MG tablet TAKE 1 TABLET BY MOUTH IN THE EVENING 90 tablet 1   No current facility-administered medications for this visit.    Allergies   Allergies as of 01/17/2023 - Review Complete 01/17/2023  Allergen Reaction Noted   Sulfonamide derivatives Hives and Rash     Past Medical History   Past Medical History:  Diagnosis Date  [name] (abdominal aortic aneurysm) (HCC) 09/2013   3.6 cm   Acute and chronic respiratory failure with hypoxia (HCC)    Acute delirium    Acute encephalopathy    Acute pyelonephritis    Atrial fibrillation (HCC)    Chronic pain syndrome    Constipation    Dementia (HCC)    Dependent edema    bilateral legs    Depression    Diabetes mellitus    pre-diabetes   Diverticulosis    Diverticulosis    Dysuria    E coli bacteremia    Fall    Gross hematuria    Hydronephrosis with renal and ureteral calculous obstruction    Hyperlipidemia    Hypertension    Hypoxemia    Idiopathic peripheral autonomic neuropathy    Infestation by bed bug    Nephrolithiasis    OSA on CPAP    Osteoarthritis    Osteopenia    T=  -2.1 in hip   Persistent  mood (affective) disorder, unspecified (HCC)    Prediabetes    Rheumatoid arthritis (HCC)    Rheumatoid arthritis(714.0)    S/P carpal tunnel release    Sepsis secondary to UTI [facility])    Spinal stenosis    Unspecified fracture of fifth lumbar vertebra, initial encounter for closed fracture (HCC)    Unspecified fracture of t11-T12 vertebra, initial encounter for closed fracture (HCC)    Urinary incontinence    Urinary tract infection    hx of    Urolithiasis     Past Surgical History   Past Surgical History:  Procedure Laterality Date   APPENDECTOMY     CARPAL TUNNEL RELEASE  12/27/2006   Right subcutaneous ulnar nerve transfer -- Right open carpal tunnel release   CHOLECYSTECTOMY     CYSTOSCOPY W/ URETERAL STENT PLACEMENT Right 01/30/2018   Procedure: CYSTOSCOPY WITH STENT REPLACEMENT retrograde pylegram;  Surgeon: Crist Fat, MD;  Location: [facility];  Service: Urology;  Laterality: Right;   CYSTOSCOPY WITH URETEROSCOPY AND STENT PLACEMENT Right 03/01/2018   Procedure: CYSTOSCPY/RIGHT URETEROSCOPY HOLMIUM LASER LITHOTRIPSY AND STENT EXCHANGE;  Surgeon: Crist Fat, MD;  Location: [facility];  Service: Urology;  Laterality: Right;   FOOT SURGERY     HIP ARTHROPLASTY Left 08/27/2020   Procedure: ARTHROPLASTY BIPOLAR HIP (HEMIARTHROPLASTY);  Surgeon: Oliver Barre, MD;  Location: [facility];  Service: Orthopedics;  Laterality: Left;   HOLMIUM LASER APPLICATION Right 03/01/2018   Procedure: HOLMIUM LASER APPLICATION;  Surgeon: Crist Fat, MD;  [facility]: [facility];  Service: Urology;  Laterality: Right;   JOINT REPLACEMENT     BTKR, RTHR   TOTAL KNEE ARTHROPLASTY  08/21/2007   Right total knee replacement    Past Family History   Family History  Problem Relation Age of Onset   Stroke Mother    Stroke Father    Hypertension Sister    Diabetes Brother    Coronary artery disease Son        CABG at age 3   Heart disease Son    Heart attack Son    Hypertension  Maternal Aunt    Cancer Daughter    Colon cancer Neg Hx     Past Social History   Social History   Socioeconomic History   Marital status: Divorced    Spouse name: Not on file   Number of children: 6   Years of education: 8th grade   Highest education level: Not on file  Occupational History  Occupation: Retired  Tobacco Use   Smoking status: Never   Smokeless tobacco: Never  Vaping Use   Vaping status: Never Used  Substance and Sexual Activity   Alcohol use: No   Drug use: No   Sexual activity: Not on file  Other Topics Concern   Not on file  Social History Narrative   Lives at home with 2 sons   She has four living children.   Right-handed.   Two cups caffeine per day.   Social Determinants of Health   Financial Resource Strain: Low Risk  (04/19/2022)   Overall Financial Resource Strain (CARDIA)    Difficulty of Paying Living Expenses: Not hard at all  Food Insecurity: No Food Insecurity (04/19/2022)   Hunger Vital Sign    Worried About Running Out of Food in the Last Year: Never true    Ran Out of Food in the Last Year: Never true  Transportation Needs: No Transportation Needs (04/19/2022)   PRAPARE - Administrator, Civil Service (Medical): No    Lack of Transportation (Non-Medical): No  Physical Activity: Inactive (04/19/2022)   Exercise Vital Sign    Days of Exercise per Week: 0 days    Minutes of Exercise per Session: 0 min  Stress: No Stress Concern Present (04/19/2022)   [employer] of Occupational Health - Occupational Stress Questionnaire    Feeling of Stress : Not at all  Social Connections: Socially Isolated (04/19/2022)   Social Connection and Isolation Panel [NHANES]    Frequency of Communication with Friends and Family: More than three times a week    Frequency of Social Gatherings with Friends and Family: Three times a week    Attends Religious Services: Never    Active Member of Clubs or Organizations: No    Attends Occupational hygienist Meetings: Never    Marital Status: Divorced  Catering manager Violence: Not At Risk (04/19/2022)   Humiliation, Afraid, Rape, and Kick questionnaire    Fear of Current or Ex-Partner: No    Emotionally Abused: No    Physically Abused: No    Sexually Abused: No    Review of Systems  Questionable reliability  General: Negative for anorexia, weight loss, fever, chills, fatigue, weakness. Eyes: Negative for vision changes.  ENT: Negative for hoarseness,   nasal congestion.  See HPI CV: Negative for chest pain, angina, palpitations, dyspnea on exertion, positive peripheral edema.  Respiratory: Negative for dyspnea at rest, dyspnea on exertion, cough, sputum, wheezing.  GI: See history of present illness. GU:  Negative for dysuria, hematuria, urinary incontinence, urinary frequency, nocturnal urination.  MS: Negative for joint pain, low back pain.  Derm: Negative for rash or itching.  Neuro: Negative for weakness, abnormal sensation, seizure, frequent headaches, memory loss,  confusion.  Psych: Negative for anxiety, depression, suicidal ideation, hallucinations.  Endo: Negative for unusual weight change.  Heme: Negative for bruising or bleeding. Allergy: Negative for rash or hives.  Physical Exam   BP 121/77 (BP Location: Left Arm, Patient Position: Sitting, Cuff Size: Large)   Pulse 90   Temp 98.7 F (37.1 C) (Oral)   Ht 5\' 5"  (1.651 m)   Wt 160 lb 6.4 oz (72.8 kg)   SpO2 90%   BMI 26.69 kg/m    General: Elderly, pleasant in no acute distress.  Head: Normocephalic, atraumatic.   Eyes: Conjunctiva pink, no icterus. Mouth: Oropharyngeal mucosa moist and pink Neck: Supple without thyromegaly, masses, or lymphadenopathy.  Lungs: Clear to auscultation bilaterally.  Heart: Regular rate and rhythm, no murmurs rubs or gallops.  Abdomen: Bowel sounds are normal, nontender, nondistended, no rebound or guarding.   Rectal: Not performed Extremities: Trace bilateral  lower extremity edema. No clubbing or deformities.  Neuro: Alert and oriented x 4 , grossly normal neurologically.  Skin: Warm and dry, no rash or jaundice.   Psych: Alert and cooperative, normal mood and affect.  Labs   Lab Results  Component Value Date   NA 139 07/01/2022   CL 101 07/01/2022   K 4.6 07/01/2022   CO2 31 07/01/2022   BUN 18 07/01/2022   CREATININE 1.05 (H) 07/01/2022   EGFR 52 (L) 07/01/2022   CALCIUM 9.2 07/01/2022   PHOS 3.1 02/15/2021   ALBUMIN 3.2 (L) 10/15/2021   GLUCOSE 126 (H) 07/01/2022   Lab Results  Component Value Date   WBC 6.0 07/01/2022   HGB 12.1 07/01/2022   HCT 38.4 07/01/2022   MCV 89.1 07/01/2022   PLT 168 07/01/2022   Lab Results  Component Value Date   HGBA1C 5.8 (H) 07/01/2022   Lab Results  Component Value Date   ALT 9 07/01/2022   AST 15 07/01/2022   ALKPHOS 55 10/15/2021   BILITOT 0.6 07/01/2022    Imaging Studies   No results found.  Assessment   *Esophageal dysphagia  Possible esophageal stricture/ring but cannot rule out large hiatal hernia, esophageal motility disorder, oropharyngeal dysphagia, malignancy.  Would offer her an upper endoscopy for further evaluation but if unremarkable or fails to respond to esophageal dilation, she may need additional workup.     PLAN   EGD with esophageal dilation with Dr. Jena Gauss.  ASA 3.  I have discussed the risks, alternatives, benefits with regards to but not limited to the risk of reaction to medication, bleeding, infection, perforation and the patient is agreeable to proceed. Written consent to be obtained. Continue pantoprazole 40 mg daily.   Leanna Battles. Melvyn Neth, MHS, PA-C [facility]

## 2023-01-17 NOTE — Patient Instructions (Signed)
Continue pantoprazole 40mg  daily. Upper endoscopy to be scheduled.

## 2023-01-17 NOTE — Progress Notes (Signed)
GI Office Note    Referring Provider: Donita Brooks, MD Primary Care Physician:  Donita Brooks, MD  Primary Gastroenterologist: Roetta Sessions, MD   Chief Complaint   Chief Complaint  Patient presents with   Dysphagia    Food is getting stuck causing her to get choked     History of Present Illness   [name] is a 89 y.o. female presenting today for dysphagia at the request of Dr. Tanya Nones.  Patient presents with her son, [name].  She has baseline dementia.  He provides majority of the history for her.  He has noticed that she tends to have issues swallowing solid foods over the past one year, gradually worsening. Can happen with hard or soft food. She will cough and bring food up at times.  Never seems to be in distress or trying to catch her breath.  Does not seem to complain of reflux, heartburn, abdominal pain.  No unintentional weight loss. No teeth/dentures. No weight loss. BM regular. No melena, brbpr.   No known EGD/TCS.   Medications   Current Outpatient Medications  Medication Sig Dispense Refill   acetaminophen (TYLENOL) 325 MG tablet Take 2 tablets (650 mg total) by mouth every 6 (six) hours.     albuterol (VENTOLIN HFA) 108 (90 Base) MCG/ACT inhaler Inhale 2 puffs into the lungs every 4 (four) hours as needed for wheezing or shortness of breath.     ammonium lactate (AMLACTIN) 12 % cream Apply topically as needed for dry skin. 385 g 0   aspirin EC 81 MG tablet Take 1 po BID x 6 weeks then resume 1 po daily 30 tablet 11   atenolol (TENORMIN) 25 MG tablet TAKE 1/2 TABLET BY MOUTH DAILY 15 tablet 10   Calcium Carbonate-Vitamin D3 600-400 MG-UNIT TABS Take 1 tablet by mouth daily.     clotrimazole-betamethasone (LOTRISONE) cream APPLY 1 APPLICATION TOPICALLY DAILY 30 g 0   diclofenac sodium (VOLTAREN) 1 % GEL Apply 2 g topically 4 (four) times daily. Rub into affected area of foot 2 to 4 times daily 100 g 2   DULoxetine (CYMBALTA) 30 MG capsule TAKE  1 CAPSULE BY MOUTH DAILY 90 capsule 0   DULoxetine (CYMBALTA) 60 MG capsule TAKE 1 CAPSULE BY MOUTH DAILY *TAKE IN ADDITION TO THE CYMBALTA 30 MG TO EQUAL A TOTAL OF 90 MG DAILY. *PATIENT NEEDS APPOINTMENT FOR FURTHER REFILLS* 90 capsule 0   ezetimibe (ZETIA) 10 MG tablet TAKE 1 TABLET (10 MG TOTAL) BY MOUTH DAILY. 90 tablet 3   gabapentin (NEURONTIN) 800 MG tablet TAKE 1 TABLET BY MOUTH 3 TIMES DAILY 90 tablet 5   ketoconazole (NIZORAL) 2 % cream Apply 1 application topically daily.     mupirocin ointment (BACTROBAN) 2 % Apply 1 application  topically 2 (two) times daily.     pantoprazole (PROTONIX) 40 MG tablet TAKE 1 TABLET BY MOUTH ONCE DAILY 90 tablet 1   polyethylene glycol (MIRALAX / GLYCOLAX) 17 g packet Take 17 g by mouth daily as needed for mild constipation or moderate constipation.     rosuvastatin (CRESTOR) 40 MG tablet TAKE 1 TABLET BY MOUTH IN THE EVENING 90 tablet 1   No current facility-administered medications for this visit.    Allergies   Allergies as of 01/17/2023 - Review Complete 01/17/2023  Allergen Reaction Noted   Sulfonamide derivatives Hives and Rash     Past Medical History   Past Medical History:  Diagnosis Date  [name] (abdominal aortic aneurysm) (HCC) 09/2013   3.6 cm   Acute and chronic respiratory failure with hypoxia (HCC)    Acute delirium    Acute encephalopathy    Acute pyelonephritis    Atrial fibrillation (HCC)    Chronic pain syndrome    Constipation    Dementia (HCC)    Dependent edema    bilateral legs    Depression    Diabetes mellitus    pre-diabetes   Diverticulosis    Diverticulosis    Dysuria    E coli bacteremia    Fall    Gross hematuria    Hydronephrosis with renal and ureteral calculous obstruction    Hyperlipidemia    Hypertension    Hypoxemia    Idiopathic peripheral autonomic neuropathy    Infestation by bed bug    Nephrolithiasis    OSA on CPAP    Osteoarthritis    Osteopenia    T=  -2.1 in hip   Persistent  mood (affective) disorder, unspecified (HCC)    Prediabetes    Rheumatoid arthritis (HCC)    Rheumatoid arthritis(714.0)    S/P carpal tunnel release    Sepsis secondary to UTI [facility])    Spinal stenosis    Unspecified fracture of fifth lumbar vertebra, initial encounter for closed fracture (HCC)    Unspecified fracture of t11-T12 vertebra, initial encounter for closed fracture (HCC)    Urinary incontinence    Urinary tract infection    hx of    Urolithiasis     Past Surgical History   Past Surgical History:  Procedure Laterality Date   APPENDECTOMY     CARPAL TUNNEL RELEASE  12/27/2006   Right subcutaneous ulnar nerve transfer -- Right open carpal tunnel release   CHOLECYSTECTOMY     CYSTOSCOPY W/ URETERAL STENT PLACEMENT Right 01/30/2018   Procedure: CYSTOSCOPY WITH STENT REPLACEMENT retrograde pylegram;  Surgeon: Crist Fat, MD;  Location: [facility];  Service: Urology;  Laterality: Right;   CYSTOSCOPY WITH URETEROSCOPY AND STENT PLACEMENT Right 03/01/2018   Procedure: CYSTOSCPY/RIGHT URETEROSCOPY HOLMIUM LASER LITHOTRIPSY AND STENT EXCHANGE;  Surgeon: Crist Fat, MD;  Location: [facility];  Service: Urology;  Laterality: Right;   FOOT SURGERY     HIP ARTHROPLASTY Left 08/27/2020   Procedure: ARTHROPLASTY BIPOLAR HIP (HEMIARTHROPLASTY);  Surgeon: Oliver Barre, MD;  Location: [facility];  Service: Orthopedics;  Laterality: Left;   HOLMIUM LASER APPLICATION Right 03/01/2018   Procedure: HOLMIUM LASER APPLICATION;  Surgeon: Crist Fat, MD;  [facility];  Service: Urology;  Laterality: Right;   JOINT REPLACEMENT     BTKR, RTHR   TOTAL KNEE ARTHROPLASTY  08/21/2007   Right total knee replacement    Past Family History   Family History  Problem Relation Age of Onset   Stroke Mother    Stroke Father    Hypertension Sister    Diabetes Brother    Coronary artery disease Son        CABG at age 60   Heart disease Son    Heart attack Son    Hypertension  Maternal Aunt    Cancer Daughter    Colon cancer Neg Hx     Past Social History   Social History   Socioeconomic History   Marital status: Divorced    Spouse name: Not on file   Number of children: 6   Years of education: 8th grade   Highest education level: Not on file  Occupational History  Occupation: Retired  Tobacco Use   Smoking status: Never   Smokeless tobacco: Never  Vaping Use   Vaping status: Never Used  Substance and Sexual Activity   Alcohol use: No   Drug use: No   Sexual activity: Not on file  Other Topics Concern   Not on file  Social History Narrative   Lives at home with 2 sons   She has four living children.   Right-handed.   Two cups caffeine per day.   Social Determinants of Health   Financial Resource Strain: Low Risk  (04/19/2022)   Overall Financial Resource Strain (CARDIA)    Difficulty of Paying Living Expenses: Not hard at all  Food Insecurity: No Food Insecurity (04/19/2022)   Hunger Vital Sign    Worried About Running Out of Food in the Last Year: Never true    Ran Out of Food in the Last Year: Never true  Transportation Needs: No Transportation Needs (04/19/2022)   PRAPARE - Administrator, Civil Service (Medical): No    Lack of Transportation (Non-Medical): No  Physical Activity: Inactive (04/19/2022)   Exercise Vital Sign    Days of Exercise per Week: 0 days    Minutes of Exercise per Session: 0 min  Stress: No Stress Concern Present (04/19/2022)   [employer] of Occupational Health - Occupational Stress Questionnaire    Feeling of Stress : Not at all  Social Connections: Socially Isolated (04/19/2022)   Social Connection and Isolation Panel [NHANES]    Frequency of Communication with Friends and Family: More than three times a week    Frequency of Social Gatherings with Friends and Family: Three times a week    Attends Religious Services: Never    Active Member of Clubs or Organizations: No    Attends Occupational hygienist Meetings: Never    Marital Status: Divorced  Catering manager Violence: Not At Risk (04/19/2022)   Humiliation, Afraid, Rape, and Kick questionnaire    Fear of Current or Ex-Partner: No    Emotionally Abused: No    Physically Abused: No    Sexually Abused: No    Review of Systems  Questionable reliability  General: Negative for anorexia, weight loss, fever, chills, fatigue, weakness. Eyes: Negative for vision changes.  ENT: Negative for hoarseness,   nasal congestion.  See HPI CV: Negative for chest pain, angina, palpitations, dyspnea on exertion, positive peripheral edema.  Respiratory: Negative for dyspnea at rest, dyspnea on exertion, cough, sputum, wheezing.  GI: See history of present illness. GU:  Negative for dysuria, hematuria, urinary incontinence, urinary frequency, nocturnal urination.  MS: Negative for joint pain, low back pain.  Derm: Negative for rash or itching.  Neuro: Negative for weakness, abnormal sensation, seizure, frequent headaches, memory loss,  confusion.  Psych: Negative for anxiety, depression, suicidal ideation, hallucinations.  Endo: Negative for unusual weight change.  Heme: Negative for bruising or bleeding. Allergy: Negative for rash or hives.  Physical Exam   BP 121/77 (BP Location: Left Arm, Patient Position: Sitting, Cuff Size: Large)   Pulse 90   Temp 98.7 F (37.1 C) (Oral)   Ht 5\' 5"  (1.651 m)   Wt 160 lb 6.4 oz (72.8 kg)   SpO2 90%   BMI 26.69 kg/m    General: Elderly, pleasant in no acute distress.  Head: Normocephalic, atraumatic.   Eyes: Conjunctiva pink, no icterus. Mouth: Oropharyngeal mucosa moist and pink Neck: Supple without thyromegaly, masses, or lymphadenopathy.  Lungs: Clear to auscultation bilaterally.  Heart: Regular rate and rhythm, no murmurs rubs or gallops.  Abdomen: Bowel sounds are normal, nontender, nondistended, no rebound or guarding.   Rectal: Not performed Extremities: Trace bilateral  lower extremity edema. No clubbing or deformities.  Neuro: Alert and oriented x 4 , grossly normal neurologically.  Skin: Warm and dry, no rash or jaundice.   Psych: Alert and cooperative, normal mood and affect.  Labs   Lab Results  Component Value Date   NA 139 07/01/2022   CL 101 07/01/2022   K 4.6 07/01/2022   CO2 31 07/01/2022   BUN 18 07/01/2022   CREATININE 1.05 (H) 07/01/2022   EGFR 52 (L) 07/01/2022   CALCIUM 9.2 07/01/2022   PHOS 3.1 02/15/2021   ALBUMIN 3.2 (L) 10/15/2021   GLUCOSE 126 (H) 07/01/2022   Lab Results  Component Value Date   WBC 6.0 07/01/2022   HGB 12.1 07/01/2022   HCT 38.4 07/01/2022   MCV 89.1 07/01/2022   PLT 168 07/01/2022   Lab Results  Component Value Date   HGBA1C 5.8 (H) 07/01/2022   Lab Results  Component Value Date   ALT 9 07/01/2022   AST 15 07/01/2022   ALKPHOS 55 10/15/2021   BILITOT 0.6 07/01/2022    Imaging Studies   No results found.  Assessment   *Esophageal dysphagia  Possible esophageal stricture/ring but cannot rule out large hiatal hernia, esophageal motility disorder, oropharyngeal dysphagia, malignancy.  Would offer her an upper endoscopy for further evaluation but if unremarkable or fails to respond to esophageal dilation, she may need additional workup.     PLAN   EGD with esophageal dilation with Dr. Jena Gauss.  ASA 3.  I have discussed the risks, alternatives, benefits with regards to but not limited to the risk of reaction to medication, bleeding, infection, perforation and the patient is agreeable to proceed. Written consent to be obtained. Continue pantoprazole 40 mg daily.   Leanna Battles. Melvyn Neth, MHS, PA-C [facility]

## 2023-01-18 ENCOUNTER — Encounter: Payer: Self-pay | Admitting: *Deleted

## 2023-01-18 ENCOUNTER — Encounter: Payer: Self-pay | Admitting: Gastroenterology

## 2023-01-18 ENCOUNTER — Telehealth: Payer: Self-pay | Admitting: *Deleted

## 2023-01-18 NOTE — Telephone Encounter (Signed)
[facility]  EGD/ED w/Dr.Rourk, ASA 3

## 2023-01-18 NOTE — Telephone Encounter (Signed)
Pt has been scheduled for 02/15/23. Instructions mailed.   [employer] PA: Notification or Prior Authorization is not required for the requested services You are not required to submit a notification/prior authorization based on the information provided. If you have general questions about the prior authorization requirements, visit [email or web address] > Clinician Resources > Advance and Admission Notification Requirements. The number above acknowledges your notification. Please write this reference number down for future reference. If you would like to request an organization determination, please call us at [phone]. Decision ID #: [id number]

## 2023-01-19 ENCOUNTER — Other Ambulatory Visit: Payer: Self-pay | Admitting: Family Medicine

## 2023-01-19 DIAGNOSIS — I6381 Other cerebral infarction due to occlusion or stenosis of small artery: Secondary | ICD-10-CM

## 2023-01-20 ENCOUNTER — Encounter: Payer: Self-pay | Admitting: Family Medicine

## 2023-01-20 NOTE — Telephone Encounter (Signed)
Requested Prescriptions  Pending Prescriptions Disp Refills   rosuvastatin (CRESTOR) 40 MG tablet [Pharmacy Med Name: ROSUVASTATIN 40MG  TAB 40 Tablet] 90 tablet 1    Sig: TAKE 1 TABLET BY MOUTH IN THE EVENING     Cardiovascular:  Antilipid - Statins 2 Failed - 01/19/2023  8:20 AM      Failed - Cr in normal range and within 360 days    Creat  Date Value Ref Range Status  07/01/2022 1.05 (H) 0.60 - 0.95 mg/dL Final   Creatinine, Urine  Date Value Ref Range Status  03/08/2007 135.3 mg/dL Final         Failed - Valid encounter within last 12 months    Recent Outpatient Visits           1 year ago History of CVA (cerebrovascular accident)   [facility] Donita Brooks, MD   1 year ago Asymptomatic bacteriuria   [facility] Donita Brooks, MD   1 year ago Dehydration   [facility] Donita Brooks, MD   2 years ago Atrial fibrillation, chronic (HCC)   [facility] Donita Brooks, MD   2 years ago Acute cystitis without hematuria   [facility] Cathlean Marseilles A, NP              Failed - Lipid Panel in normal range within the last 12 months    Cholesterol  Date Value Ref Range Status  07/01/2022 93 <200 mg/dL Final   LDL Cholesterol (Calc)  Date Value Ref Range Status  07/01/2022 27 mg/dL (calc) Final    Comment:    Reference range: <100 . Desirable range <100 mg/dL for primary prevention;   <70 mg/dL for patients with CHD or diabetic patients  with > or = 2 CHD risk factors. Marland Kitchen LDL-C is now calculated using the Martin-Hopkins  calculation, which is a validated novel method providing  better accuracy than the Friedewald equation in the  estimation of LDL-C.  Horald Pollen et al. Lenox Ahr. 4098;119(14): 2061-2068  (http://education.[employer].com/faq/FAQ164)    HDL  Date Value Ref Range Status  07/01/2022 48 (L) > OR = 50 mg/dL Final   Triglycerides  Date Value Ref Range  Status  07/01/2022 98 <150 mg/dL Final         Passed - Patient is not pregnant       pantoprazole (PROTONIX) 40 MG tablet [Pharmacy Med Name: PANTOPRAZOLE DR 40MG  TAB 40 Tablet] 90 tablet 1    Sig: TAKE 1 TABLET BY MOUTH ONCE DAILY     Gastroenterology: Proton Pump Inhibitors Failed - 01/19/2023  8:20 AM      Failed - Valid encounter within last 12 months    Recent Outpatient Visits           1 year ago History of CVA (cerebrovascular accident)   [facility] Donita Brooks, MD   1 year ago Asymptomatic bacteriuria   [facility] Donita Brooks, MD   1 year ago Dehydration   [facility] Donita Brooks, MD   2 years ago Atrial fibrillation, chronic (HCC)   [facility] Donita Brooks, MD   2 years ago Acute cystitis without hematuria   [facility] Medicine Valentino Nose, NP

## 2023-01-24 ENCOUNTER — Ambulatory Visit (INDEPENDENT_AMBULATORY_CARE_PROVIDER_SITE_OTHER): Payer: 59 | Admitting: Podiatry

## 2023-01-24 ENCOUNTER — Encounter: Payer: Self-pay | Admitting: Podiatry

## 2023-01-24 DIAGNOSIS — M79676 Pain in unspecified toe(s): Secondary | ICD-10-CM

## 2023-01-24 DIAGNOSIS — B351 Tinea unguium: Secondary | ICD-10-CM | POA: Diagnosis not present

## 2023-01-24 DIAGNOSIS — E1142 Type 2 diabetes mellitus with diabetic polyneuropathy: Secondary | ICD-10-CM

## 2023-01-25 NOTE — Progress Notes (Signed)
She presents today chief complaint of painful elongated toenails.  States that her feet hurt all the time because of the neuropathy.  Objective: Pulses are barely palpable.  Capillary fill time is immediate toenails are long thick yellow dystrophic onychomycotic.  No open lesions or wounds  Assessment: Pain in limb secondary to diabetic peripheral neuropathy and onychomycosis.  Plan: Debridement of toenails 1 through 5 bilateral.

## 2023-02-01 ENCOUNTER — Telehealth: Payer: Self-pay | Admitting: Family Medicine

## 2023-02-01 ENCOUNTER — Other Ambulatory Visit: Payer: Self-pay

## 2023-02-01 DIAGNOSIS — R062 Wheezing: Secondary | ICD-10-CM

## 2023-02-01 DIAGNOSIS — R0602 Shortness of breath: Secondary | ICD-10-CM

## 2023-02-01 DIAGNOSIS — L304 Erythema intertrigo: Secondary | ICD-10-CM

## 2023-02-01 MED ORDER — ALBUTEROL SULFATE HFA 108 (90 BASE) MCG/ACT IN AERS: 2.0000 | INHALATION_SPRAY | RESPIRATORY_TRACT | 3 refills | Status: DC | PRN

## 2023-02-01 MED ORDER — CLOTRIMAZOLE-BETAMETHASONE 1-0.05 % EX CREA: 1.0000 | TOPICAL_CREAM | Freq: Every day | CUTANEOUS | 1 refills | Status: DC

## 2023-02-01 NOTE — Telephone Encounter (Signed)
Patient's son Renae Fickle called to follow up on refills requested for   albuterol (VENTOLIN HFA) 108 (90 Base) MCG/ACT inhaler   clotrimazole-betamethasone (LOTRISONE) cream [[id number]]   LOV 01/05/2023  Patient out of both medications.   Pharmacy advised patient to call; stated they're still waiting for a reply from this office.  Pharmacy confirmed as:   [id number] - [place], [place] - [address] [place], [place] [address] Phone: [phone]  Fax: [phone] DEA #: [id number]   Please advise at [phone].

## 2023-02-13 NOTE — Patient Instructions (Signed)
20    Your procedure is scheduled on: 02/15/2023  Report to [facility] Main Entrance at     12:30 PM.  Call this number if you have problems the morning of surgery: [phone]   Remember:   Follow instructions on letter from office regarding when to stop eating and drinking        No Smoking the day of procedure  You may have CLEAR liquids until 11:00 am      Take these medicines the morning of surgery with A SIP OF WATER: Atenolol, Cymbalta, Gabapentin, and Pantoprazole  No diabetic medications am of procedure   Do not wear jewelry, make-up or nail polish.  Do not wear lotions, powders, or perfumes. You may wear deodorant.                Do not bring valuables to the hospital.  Contacts, dentures or bridgework may not be worn into surgery.  Leave suitcase in the car. After surgery it may be brought to your room.  For patients admitted to the hospital, checkout time is 11:00 AM the day of discharge.   Patients discharged the day of surgery will not be allowed to drive home. Upper Endoscopy, Adult Upper endoscopy is a procedure to look inside the upper GI (gastrointestinal) tract. The upper GI tract is made up of: The part of the body that moves food from your mouth to your stomach (esophagus). The stomach. The first part of your small intestine (duodenum). This procedure is also called esophagogastroduodenoscopy (EGD) or gastroscopy. In this procedure, your health care provider passes a thin, flexible tube (endoscope) through your mouth and down your esophagus into your stomach. A small camera is attached to the end of the tube. Images from the camera appear on a monitor in the exam room. During this procedure, your health care provider may also remove a small piece of tissue to be sent to a lab and examined under a microscope (biopsy). Your health care provider may do an upper endoscopy to diagnose cancers of the upper GI tract. You may also have this procedure to find the cause of  other conditions, such as: Stomach pain. Heartburn. Pain or problems when swallowing. Nausea and vomiting. Stomach bleeding. Stomach ulcers. Tell a health care provider about: Any allergies you have. All medicines you are taking, including vitamins, herbs, eye drops, creams, and over-the-counter medicines. Any problems you or family members have had with anesthetic medicines. Any blood disorders you have. Any surgeries you have had. Any medical conditions you have. Whether you are pregnant or may be pregnant. What are the risks? Generally, this is a safe procedure. However, problems may occur, including: Infection. Bleeding. Allergic reactions to medicines. A tear or hole (perforation) in the esophagus, stomach, or duodenum. What happens before the procedure? Staying hydrated Follow instructions from your health care provider about hydration, which may include: Up to 4 hours before the procedure - you may continue to drink clear liquids, such as water, clear fruit juice, black coffee, and plain tea.   Medicines Ask your health care provider about: Changing or stopping your regular medicines. This is especially important if you are taking diabetes medicines or blood thinners. Taking medicines such as aspirin and ibuprofen. These medicines can thin your blood. Do not take these medicines unless your health care provider tells you to take them. Taking over-the-counter medicines, vitamins, herbs, and supplements. General instructions Plan to have someone take you home from the hospital or clinic. If you  will be going home right after the procedure, plan to have someone with you for 24 hours. Ask your health care provider what steps will be taken to help prevent infection. What happens during the procedure?  An IV will be inserted into one of your veins. You may be given one or more of the following: A medicine to help you relax (sedative). A medicine to numb the throat (local  anesthetic). You will lie on your left side on an exam table. Your health care provider will pass the endoscope through your mouth and down your esophagus. Your health care provider will use the scope to check the inside of your esophagus, stomach, and duodenum. Biopsies may be taken. The endoscope will be removed. The procedure may vary among health care providers and hospitals. What happens after the procedure? Your blood pressure, heart rate, breathing rate, and blood oxygen level will be monitored until you leave the hospital or clinic. Do not drive for 24 hours if you were given a sedative during your procedure. When your throat is no longer numb, you may be given some fluids to drink. It is up to you to get the results of your procedure. Ask your health care provider, or the department that is doing the procedure, when your results will be ready. Summary Upper endoscopy is a procedure to look inside the upper GI tract. During the procedure, an IV will be inserted into one of your veins. You may be given a medicine to help you relax. A medicine will be used to numb your throat. The endoscope will be passed through your mouth and down your esophagus. This information is not intended to replace advice given to you by your health care provider. Make sure you discuss any questions you have with your health care provider. Document Revised: 10/11/2017 Document Reviewed: 09/18/2017 Elsevier Patient Education  2020 Elsevier Inc.                                                                                                                                      EndoscopyCare After  Please read the instructions outlined below and refer to this sheet in the next few weeks. These discharge instructions provide you with general information on caring for yourself after you leave the hospital. Your doctor may also give you specific instructions. While your treatment has been planned according to the  most current medical practices available, unavoidable complications occasionally occur. If you have any problems or questions after discharge, please call your doctor. HOME CARE INSTRUCTIONS Activity You may resume your regular activity but move at a slower pace for the next 24 hours.  Take frequent rest periods for the next 24 hours.  Walking will help expel (get rid of) the air and reduce the bloated feeling in your abdomen.  No driving for 24 hours (because of the anesthesia (medicine) used during the test).  You may shower.  Do not sign any important legal documents or operate any machinery for 24 hours (because of the anesthesia used during the test).  Nutrition Drink plenty of fluids.  You may resume your normal diet.  Begin with a light meal and progress to your normal diet.  Avoid alcoholic beverages for 24 hours or as instructed by your caregiver.  Medications You may resume your normal medications unless your caregiver tells you otherwise. What you can expect today You may experience abdominal discomfort such as a feeling of fullness or "gas" pains.  You may experience a sore throat for 2 to 3 days. This is normal. Gargling with salt water may help this.  Follow-up Your doctor will discuss the results of your test with you. SEEK IMMEDIATE MEDICAL CARE IF: You have excessive nausea (feeling sick to your stomach) and/or vomiting.  You have severe abdominal pain and distention (swelling).  You have trouble swallowing.  You have a temperature over 100 F (37.8 C).  You have rectal bleeding or vomiting of blood.  Document Released: 12/01/2003 Document Revised: 04/07/2011 Document Reviewed: 06/13/2007 Esophageal Dilatation Esophageal dilatation, also called esophageal dilation, is a procedure to widen or open a blocked or narrowed part of the esophagus. The esophagus is the part of the body that moves food and liquid from the mouth to the stomach. You may need this procedure  if: You have a buildup of scar tissue in your esophagus that makes it difficult, painful, or impossible to swallow. This can be caused by gastroesophageal reflux disease (GERD). You have cancer of the esophagus. There is a problem with how food moves through your esophagus. In some cases, you may need this procedure repeated at a later time to dilate the esophagus gradually. Tell a health care provider about: Any allergies you have. All medicines you are taking, including vitamins, herbs, eye drops, creams, and over-the-counter medicines. Any problems you or family members have had with anesthetic medicines. Any blood disorders you have. Any surgeries you have had. Any medical conditions you have. Any antibiotic medicines you are required to take before dental procedures. Whether you are pregnant or may be pregnant. What are the risks? Generally, this is a safe procedure. However, problems may occur, including: Bleeding due to a tear in the lining of the esophagus. A hole, or perforation, in the esophagus. What happens before the procedure? Ask your health care provider about: Changing or stopping your regular medicines. This is especially important if you are taking diabetes medicines or blood thinners. Taking medicines such as aspirin and ibuprofen. These medicines can thin your blood. Do not take these medicines unless your health care provider tells you to take them. Taking over-the-counter medicines, vitamins, herbs, and supplements. Follow instructions from your health care provider about eating or drinking restrictions. Plan to have a responsible adult take you home from the hospital or clinic. Plan to have a responsible adult care for you for the time you are told after you leave the hospital or clinic. This is important. What happens during the procedure? You may be given a medicine to help you relax (sedative). A numbing medicine may be sprayed into the back of your throat, or  you may gargle the medicine. Your health care provider may perform the dilatation using various surgical instruments, such as: Simple dilators. This instrument is carefully placed in the esophagus to stretch it. Guided wire bougies. This involves using an endoscope to insert a wire into the esophagus. A dilator is passed over this  wire to enlarge the esophagus. Then the wire is removed. Balloon dilators. An endoscope with a small balloon is inserted into the esophagus. The balloon is inflated to stretch the esophagus and open it up. The procedure may vary among health care providers and hospitals. What can I expect after the procedure? Your blood pressure, heart rate, breathing rate, and blood oxygen level will be monitored until you leave the hospital or clinic. Your throat may feel slightly sore and numb. This will get better over time. You will not be allowed to eat or drink until your throat is no longer numb. When you are able to drink, urinate, and sit on the edge of the bed without nausea or dizziness, you may be able to return home. Follow these instructions at home: Take over-the-counter and prescription medicines only as told by your health care provider. If you were given a sedative during the procedure, it can affect you for several hours. Do not drive or operate machinery until your health care provider says that it is safe. Plan to have a responsible adult care for you for the time you are told. This is important. Follow instructions from your health care provider about any eating or drinking restrictions. Do not use any products that contain nicotine or tobacco, such as cigarettes, e-cigarettes, and chewing tobacco. If you need help quitting, ask your health care provider. Keep all follow-up visits. This is important. Contact a health care provider if: You have a fever. You have pain that is not relieved by medicine. Get help right away if: You have chest pain. You have trouble  breathing. You have trouble swallowing. You vomit blood. You have black, tarry, or bloody stools. These symptoms may represent a serious problem that is an emergency. Do not wait to see if the symptoms will go away. Get medical help right away. Call your local emergency services (911 in the [place] Do not drive yourself to the hospital. Summary Esophageal dilatation, also called esophageal dilation, is a procedure to widen or open a blocked or narrowed part of the esophagus. Plan to have a responsible adult take you home from the hospital or clinic. For this procedure, a numbing medicine may be sprayed into the back of your throat, or you may gargle the medicine. Do not drive or operate machinery until your health care provider says that it is safe. This information is not intended to replace advice given to you by your health care provider. Make sure you discuss any questions you have with your health care provider. Document Revised: 09/04/2019 Document Reviewed: 09/04/2019 Elsevier Patient Education  2024 [employer].

## 2023-02-14 ENCOUNTER — Encounter (HOSPITAL_COMMUNITY): Payer: Self-pay

## 2023-02-14 ENCOUNTER — Telehealth: Payer: Self-pay | Admitting: *Deleted

## 2023-02-14 ENCOUNTER — Encounter (HOSPITAL_COMMUNITY)
Admission: RE | Admit: 2023-02-14 | Discharge: 2023-02-14 | Disposition: A | Discharge status: Home / Self Care | Payer: 59 | Source: Ambulatory Visit | Attending: Internal Medicine

## 2023-02-14 VITALS — BP 126/85 | HR 69 | Temp 97.9°F | Ht 65.0 in | Wt 160.5 lb

## 2023-02-14 DIAGNOSIS — Z01818 Encounter for other preprocedural examination: Secondary | ICD-10-CM | POA: Insufficient documentation

## 2023-02-14 DIAGNOSIS — N1831 Chronic kidney disease, stage 3a: Secondary | ICD-10-CM | POA: Insufficient documentation

## 2023-02-14 DIAGNOSIS — I10 Essential (primary) hypertension: Secondary | ICD-10-CM | POA: Insufficient documentation

## 2023-02-14 HISTORY — DX: Cardiac arrhythmia, unspecified: I49.9

## 2023-02-14 LAB — BASIC METABOLIC PANEL
Anion gap: 9 (ref 5–15)
BUN: 20 mg/dL (ref 8–23)
CO2: 29 mmol/L (ref 22–32)
Calcium: 8.9 mg/dL (ref 8.9–10.3)
Chloride: 97 mmol/L — ABNORMAL LOW (ref 98–111)
Creatinine, Ser: 0.9 mg/dL (ref 0.44–1.00)
GFR, Estimated: >60 mL/min (ref >60)
Glucose, Bld: 104 mg/dL — ABNORMAL HIGH (ref 70–99)
Potassium: 4.4 mmol/L (ref 3.5–5.1)
Sodium: 135 mmol/L (ref 135–145)

## 2023-02-14 NOTE — Telephone Encounter (Signed)
Spoke with pt's son Vincenza Hews (on [place]) and he is aware that his mom is back on for tomorrow for her procedure.

## 2023-02-14 NOTE — Telephone Encounter (Signed)
Sent message to [employer]. Neva Seat, RN

## 2023-02-14 NOTE — Telephone Encounter (Signed)
Cardiac Clearance Received: Today Jethro Bolus, RN  Kathe Mariner, LPN Hey! Patient will need cardiac clearance for [name] before proceeding.  It has been several years since the patient has been seen by cardiology as far as what we can gather from [facility].  Patient is aware of the cancellation.

## 2023-02-14 NOTE — Telephone Encounter (Signed)
She had imaging of [name] 04/2022. Has imaging yearly. Stable [name] for several year. Not sure of reason for needing cardiac clearance? Please reach out to [facility] to find out if anesthesia gave orders for cardiac clearance and did they see that her [name] is being monitored yearly and stable.

## 2023-02-14 NOTE — Telephone Encounter (Signed)
Per Armando Gang, anesthesia reviewed additional available studies and ok to proceed with EGD.   Let's make sure patient is aware.

## 2023-02-14 NOTE — Telephone Encounter (Signed)
LMTRC

## 2023-02-15 ENCOUNTER — Encounter (HOSPITAL_COMMUNITY)
Admission: RE | Disposition: A | Discharge status: Home / Self Care | Payer: Self-pay | Source: Home / Self Care | Attending: Internal Medicine

## 2023-02-15 ENCOUNTER — Ambulatory Visit (HOSPITAL_COMMUNITY)
Admission: RE | Admit: 2023-02-15 | Discharge: 2023-02-15 | Disposition: A | Discharge status: Home / Self Care | Payer: 59 | Attending: Internal Medicine | Admitting: Internal Medicine

## 2023-02-15 ENCOUNTER — Ambulatory Visit (HOSPITAL_COMMUNITY): Payer: 59 | Admitting: Certified Registered Nurse Anesthetist

## 2023-02-15 ENCOUNTER — Encounter (HOSPITAL_COMMUNITY): Payer: Self-pay | Admitting: Internal Medicine

## 2023-02-15 DIAGNOSIS — Q403 Congenital malformation of stomach, unspecified: Secondary | ICD-10-CM | POA: Diagnosis not present

## 2023-02-15 DIAGNOSIS — R0602 Shortness of breath: Secondary | ICD-10-CM | POA: Diagnosis not present

## 2023-02-15 DIAGNOSIS — K219 Gastro-esophageal reflux disease without esophagitis: Secondary | ICD-10-CM | POA: Insufficient documentation

## 2023-02-15 DIAGNOSIS — Z791 Long term (current) use of non-steroidal anti-inflammatories (NSAID): Secondary | ICD-10-CM | POA: Diagnosis not present

## 2023-02-15 DIAGNOSIS — K222 Esophageal obstruction: Secondary | ICD-10-CM

## 2023-02-15 DIAGNOSIS — K2289 Other specified disease of esophagus: Secondary | ICD-10-CM | POA: Insufficient documentation

## 2023-02-15 DIAGNOSIS — M069 Rheumatoid arthritis, unspecified: Secondary | ICD-10-CM | POA: Insufficient documentation

## 2023-02-15 DIAGNOSIS — I110 Hypertensive heart disease with heart failure: Secondary | ICD-10-CM | POA: Diagnosis not present

## 2023-02-15 DIAGNOSIS — R1310 Dysphagia, unspecified: Secondary | ICD-10-CM

## 2023-02-15 DIAGNOSIS — Z833 Family history of diabetes mellitus: Secondary | ICD-10-CM | POA: Diagnosis not present

## 2023-02-15 DIAGNOSIS — E119 Type 2 diabetes mellitus without complications: Secondary | ICD-10-CM | POA: Insufficient documentation

## 2023-02-15 DIAGNOSIS — K2950 Unspecified chronic gastritis without bleeding: Secondary | ICD-10-CM | POA: Diagnosis not present

## 2023-02-15 DIAGNOSIS — I2510 Atherosclerotic heart disease of native coronary artery without angina pectoris: Secondary | ICD-10-CM | POA: Diagnosis not present

## 2023-02-15 DIAGNOSIS — K3189 Other diseases of stomach and duodenum: Secondary | ICD-10-CM | POA: Insufficient documentation

## 2023-02-15 DIAGNOSIS — F0390 Unspecified dementia without behavioral disturbance: Secondary | ICD-10-CM | POA: Insufficient documentation

## 2023-02-15 DIAGNOSIS — F32A Depression, unspecified: Secondary | ICD-10-CM | POA: Diagnosis not present

## 2023-02-15 DIAGNOSIS — G894 Chronic pain syndrome: Secondary | ICD-10-CM | POA: Diagnosis not present

## 2023-02-15 DIAGNOSIS — G4733 Obstructive sleep apnea (adult) (pediatric): Secondary | ICD-10-CM | POA: Diagnosis not present

## 2023-02-15 DIAGNOSIS — I509 Heart failure, unspecified: Secondary | ICD-10-CM | POA: Insufficient documentation

## 2023-02-15 DIAGNOSIS — Z79899 Other long term (current) drug therapy: Secondary | ICD-10-CM | POA: Insufficient documentation

## 2023-02-15 DIAGNOSIS — Z7982 Long term (current) use of aspirin: Secondary | ICD-10-CM | POA: Insufficient documentation

## 2023-02-15 HISTORY — PX: BIOPSY: SHX5522

## 2023-02-15 HISTORY — PX: MALONEY DILATION: SHX5535

## 2023-02-15 HISTORY — PX: ESOPHAGOGASTRODUODENOSCOPY (EGD) WITH PROPOFOL: SHX5813

## 2023-02-15 SURGERY — ESOPHAGOGASTRODUODENOSCOPY (EGD) WITH PROPOFOL
Anesthesia: General

## 2023-02-15 MED ORDER — LACTATED RINGERS IV SOLN: INTRAVENOUS | Status: DC | PRN

## 2023-02-15 MED ORDER — LIDOCAINE HCL (CARDIAC) PF 100 MG/5ML IV SOSY
PREFILLED_SYRINGE | INTRAVENOUS | Status: DC | PRN
Start: 2023-02-15 — End: 2023-02-15
  Administered 2023-02-15: 60 mg via INTRATRACHEAL

## 2023-02-15 MED ORDER — PROPOFOL 500 MG/50ML IV EMUL
INTRAVENOUS | Status: DC | PRN
  Administered 2023-02-15: 150 ug/kg/min via INTRAVENOUS

## 2023-02-15 MED ORDER — PROPOFOL 10 MG/ML IV BOLUS
INTRAVENOUS | Status: DC | PRN
  Administered 2023-02-15: 50 mg via INTRAVENOUS

## 2023-02-15 NOTE — Discharge Instructions (Signed)
EGD Discharge instructions Please read the instructions outlined below and refer to this sheet in the next few weeks. These discharge instructions provide you with general information on caring for yourself after you leave the hospital. Your doctor may also give you specific instructions. While your treatment has been planned according to the most current medical practices available, unavoidable complications occasionally occur. If you have any problems or questions after discharge, please call your doctor. ACTIVITY You may resume your regular activity but move at a slower pace for the next 24 hours.  Take frequent rest periods for the next 24 hours.  Walking will help expel (get rid of) the air and reduce the bloated feeling in your abdomen.  No driving for 24 hours (because of the anesthesia (medicine) used during the test).  You may shower.  Do not sign any important legal documents or operate any machinery for 24 hours (because of the anesthesia used during the test).  NUTRITION Drink plenty of fluids.  You may resume your normal diet.  Begin with a light meal and progress to your normal diet.  Avoid alcoholic beverages for 24 hours or as instructed by your caregiver.  MEDICATIONS You may resume your normal medications unless your caregiver tells you otherwise.  WHAT YOU CAN EXPECT TODAY You may experience abdominal discomfort such as a feeling of fullness or "gas" pains.  FOLLOW-UP Your doctor will discuss the results of your test with you.  SEEK IMMEDIATE MEDICAL ATTENTION IF ANY OF THE FOLLOWING OCCUR: Excessive nausea (feeling sick to your stomach) and/or vomiting.  Severe abdominal pain and distention (swelling).  Trouble swallowing.  Temperature over 101 F (37.8 C).  Rectal bleeding or vomiting of blood.      your esophagus and stomach were somewhat inflamed.  I took samples from both areas  I stretched your esophagus.    Further recommendations to follow once I get the  sample report back for review   at patient request, I called Lang Snow at [phone] to voicemail left a message   office visit with her in 6 weeks

## 2023-02-15 NOTE — Interval H&P Note (Signed)
discussed with patient and son.  No change.  EGD with esophageal dilation as feasible/appropriate per plan. The risks, benefits, limitations, alternatives and imponderables have been reviewed with the patient. Potential for esophageal dilation, biopsy, etc. have also been reviewed.  Questions have been answered. All parties agreeable. History and Physical Interval Note:  02/15/2023 1:19 PM  [name]  has presented today for surgery, with the diagnosis of dysphagia.  The various methods of treatment have been discussed with the patient and family. After consideration of risks, benefits and other options for treatment, the patient has consented to  Procedure(s) with comments: ESOPHAGOGASTRODUODENOSCOPY (EGD) WITH PROPOFOL (N/A) - 2:15 pm, asa 3/4 MALONEY DILATION (N/A) as a surgical intervention.  The patient's history has been reviewed, patient examined, no change in status, stable for surgery.  I have reviewed the patient's chart and labs.  Questions were answered to the patient's satisfaction.     Eula Listen

## 2023-02-15 NOTE — Op Note (Signed)
[facility] Patient Name: [name] Procedure Date: 02/15/2023 1:02 PM MRN: [id number] Date of Birth: 1933/12/17 Attending MD: Gennette Pac , MD, [id number] CSN: [id number] Age: 89 Admit Type: Outpatient Procedure:                Upper GI endoscopy Indications:              Dysphagia Providers:                Gennette Pac, MD, Crystal Page, Lennice Sites                            Technician, Technician Referring MD:              Medicines:                Propofol per Anesthesia Complications:            No immediate complications. Estimated Blood Loss:     Estimated blood loss was minimal. Procedure:                Pre-Anesthesia Assessment:                           - Prior to the procedure, a History and Physical                            was performed, and patient medications and                            allergies were reviewed. The patient's tolerance of                            previous anesthesia was also reviewed. The risks                            and benefits of the procedure and the sedation                            options and risks were discussed with the patient.                            All questions were answered, and informed consent                            was obtained. Prior Anticoagulants: The patient has                            taken no anticoagulant or antiplatelet agents. ASA                            Grade Assessment: III - A patient with severe                            systemic disease. After reviewing the risks and  benefits, the patient was deemed in satisfactory                            condition to undergo the procedure.                           After obtaining informed consent, the endoscope was                            passed under direct vision. Throughout the                            procedure, the patient's blood pressure, pulse, and                            oxygen  saturations were monitored continuously. The                            [id number]-H190 ([id number]) scope was introduced through the                            mouth, and advanced to the second part of duodenum.                            The upper GI endoscopy was accomplished without                            difficulty. The patient tolerated the procedure                            well. Scope In: 1:32:21 PM Scope Out: 1:44:58 PM Total Procedure Duration: 0 hours 12 minutes 37 seconds  Findings:      Tubular esophagus abnormal and there appeared to be nonpulsatile       external compression mid esophagus. Proximal esophageal mucosa had a       reticulated appearance (see photos). Otherwise, no obvious esophagitis.       Passed the diagnostic adult scope without difficulty.      Gastric mucosa mottled. Pyloric channel deformed. No ulcer or obvious       infiltrating process.      The duodenal bulb and second portion of the duodenum were normal. Scope       removed. A 52 French Maloney dilator passed with mild resistance       subsequently a 18 [place] was passed with mild resistance. A look       back revealed no apparent complication related to this maneuver.       Biopsies of the gastric antrum and body were taken for histologic study.       Subsequently, biopsies of the abnormal appearing antrum and body taken       for histologic study. Finally, biopsies of the distal and proximal       esophagus taken for histologic study. Impression:               -Probable extrinsic compression mid esophagus.  Abnormal proximal esophageal mucosa of uncertain                            significance?"status post dilation followed by                            biopsy. Biopsy                           -Abnormal gastric mucosa with deformity of the                            pyloric channel of uncertain significance?"status                            post gastric biopsy                            Normal duodenal bulb and second portion of the                            duodenum. Moderate Sedation:      Moderate (conscious) sedation was administered by the nurse and       supervised by the endoscopist. The following parameters were monitored:       oxygen saturation, heart rate, blood pressure, respiratory rate, EKG,       adequacy of pulmonary ventilation, and response to care. Recommendation:           - Patient has a contact number available for                            emergencies. The signs and symptoms of potential                            delayed complications were discussed with the                            patient. Return to normal activities tomorrow.                            Written discharge instructions were provided to the                            patient.                           - Advance diet as tolerated.                           - Continue present medications.                           - Return to my office in 6 weeks. Procedure Code(s):        --- Professional ---                           [phone],  Esophagogastroduodenoscopy, flexible,                            transoral; diagnostic, including collection of                            specimen(s) by brushing or washing, when performed                            (separate procedure) Diagnosis Code(s):        --- Professional ---                           R13.10, Dysphagia, unspecified CPT copyright 2022 American Medical Association. All rights reserved. The codes documented in this report are preliminary and upon coder review may  be revised to meet current compliance requirements. Gerrit Friends. Neely Cecena, MD Gennette Pac, MD 02/15/2023 2:04:56 PM This report has been signed electronically. Number of Addenda: 0

## 2023-02-15 NOTE — Anesthesia Preprocedure Evaluation (Signed)
Anesthesia Evaluation  Patient identified by MRN, date of birth, ID band Patient awake    Reviewed: Allergy & Precautions, H&P , NPO status , Patient's Chart, lab work & pertinent test results, reviewed documented beta blocker date and time   Airway Mallampati: II  TM Distance: >3 FB Neck ROM: full    Dental no notable dental hx.    Pulmonary neg pulmonary ROS, shortness of breath, sleep apnea    Pulmonary exam normal breath sounds clear to auscultation       Cardiovascular Exercise Tolerance: Good hypertension, + CAD and +CHF  negative cardio ROS + dysrhythmias  Rhythm:regular Rate:Normal     Neuro/Psych  PSYCHIATRIC DISORDERS  Depression   Dementia  Neuromuscular disease CVA negative neurological ROS  negative psych ROS   GI/Hepatic negative GI ROS, Neg liver ROS,GERD  ,,  Endo/Other  negative endocrine ROSdiabetes    Renal/GU Renal diseasenegative Renal ROS  negative genitourinary   Musculoskeletal   Abdominal   Peds  Hematology negative hematology ROS (+)   Anesthesia Other Findings   Reproductive/Obstetrics negative OB ROS                             Anesthesia Physical Anesthesia Plan  ASA: 3  Anesthesia Plan: General   Post-op Pain Management:    Induction:   PONV Risk Score and Plan: Propofol infusion  Airway Management Planned:   Additional Equipment:   Intra-op Plan:   Post-operative Plan:   Informed Consent: I have reviewed the patients History and Physical, chart, labs and discussed the procedure including the risks, benefits and alternatives for the proposed anesthesia with the patient or authorized representative who has indicated his/her understanding and acceptance.     Dental Advisory Given  Plan Discussed with: CRNA  Anesthesia Plan Comments:        Anesthesia Quick Evaluation

## 2023-02-15 NOTE — Transfer of Care (Signed)
Immediate Anesthesia Transfer of Care Note  Patient: [name]  Procedure(s) Performed: ESOPHAGOGASTRODUODENOSCOPY (EGD) WITH PROPOFOL MALONEY DILATION BIOPSY  Patient Location: Endoscopy Unit  Anesthesia Type:General  Level of Consciousness: drowsy  Airway & Oxygen Therapy: Patient Spontanous Breathing  Post-op Assessment: Report given to RN and Post -op Vital signs reviewed and stable  Post vital signs: Reviewed and stable  Last Vitals:  Vitals Value Taken Time  BP 102/62 02/15/23 1351  Temp 36.5 C 02/15/23 1351  Pulse 87 02/15/23 1351  Resp 12 02/15/23 1351  SpO2 99 % 02/15/23 1351    Last Pain:  Vitals:   02/15/23 1351  TempSrc: Axillary  PainSc:          Complications: No notable events documented.

## 2023-02-16 ENCOUNTER — Other Ambulatory Visit: Payer: Self-pay | Admitting: Family Medicine

## 2023-02-16 DIAGNOSIS — G894 Chronic pain syndrome: Secondary | ICD-10-CM

## 2023-02-17 LAB — SURGICAL PATHOLOGY

## 2023-02-17 NOTE — Telephone Encounter (Signed)
Requested medications are due for refill today.  yes  Requested medications are on the active medications list.  yes  Last refill. 11/21/2022 #90 0 rf for both  Future visit scheduled.   no  Notes to clinic.  Last refill notes that pt needs OV for next refill. Please review    Requested Prescriptions  Pending Prescriptions Disp Refills   DULoxetine (CYMBALTA) 30 MG capsule [Pharmacy Med Name: DULOXETINE DR 30MG  CAP 30 Capsule] 30 capsule 10    Sig: TAKE 1 CAPSULE BY MOUTH DAILY     Psychiatry: Antidepressants - SNRI - duloxetine Failed - 02/16/2023  6:09 PM      Failed - Valid encounter within last 6 months    Recent Outpatient Visits           1 year ago History of CVA (cerebrovascular accident)   [facility] Donita Brooks, MD   1 year ago Asymptomatic bacteriuria   [facility] Donita Brooks, MD   1 year ago Dehydration   [facility] Donita Brooks, MD   2 years ago Atrial fibrillation, chronic (HCC)   [facility] Donita Brooks, MD   2 years ago Acute cystitis without hematuria   [facility] Valentino Nose, NP              Passed - Cr in normal range and within 360 days    Creat  Date Value Ref Range Status  07/01/2022 1.05 (H) 0.60 - 0.95 mg/dL Final   Creatinine, Ser  Date Value Ref Range Status  02/14/2023 0.90 0.44 - 1.00 mg/dL Final   Creatinine, Urine  Date Value Ref Range Status  03/08/2007 135.3 mg/dL Final         Passed - eGFR is 30 or above and within 360 days    GFR, Est African American  Date Value Ref Range Status  12/12/2018 66 > OR = 60 mL/min/1.25m2 Final   GFR calc Af Amer  Date Value Ref Range Status  07/10/2019 47 (L) >60 mL/min Final   GFR, Est Non African American  Date Value Ref Range Status  12/12/2018 57 (L) > OR = 60 mL/min/1.28m2 Final   GFR, Estimated  Date Value Ref Range Status  02/14/2023 >60 >60 mL/min Final     Comment:    (NOTE) Calculated using the CKD-EPI Creatinine Equation (2021)    eGFR  Date Value Ref Range Status  07/01/2022 52 (L) > OR = 60 mL/min/1.64m2 Final         Passed - Completed PHQ-2 or PHQ-9 in the last 360 days      Passed - Last BP in normal range    BP Readings from Last 1 Encounters:  02/15/23 (!) 103/51          DULoxetine (CYMBALTA) 60 MG capsule [Pharmacy Med Name: DULOXETINE 60MG  CAPSULE 60 Capsule] 30 capsule 10    Sig: TAKE 1 CAPSULE BY MOUTH DAILY *TAKE IN ADDITION TO THE CYMBALTA 30 MG TO EQUAL A TOTAL OF 90 MG DAILY. *PATIENT NEEDS APPOINTMENT FOR FURTHER REFILLS*     Psychiatry: Antidepressants - SNRI - duloxetine Failed - 02/16/2023  6:09 PM      Failed - Valid encounter within last 6 months    Recent Outpatient Visits           1 year ago History of CVA (cerebrovascular accident)   [facility] Pickard, Priscille Heidelberg, MD  1 year ago Asymptomatic bacteriuria   [facility] Pickard, Priscille Heidelberg, MD   1 year ago Dehydration   [facility] Donita Brooks, MD   2 years ago Atrial fibrillation, chronic (HCC)   [facility] Donita Brooks, MD   2 years ago Acute cystitis without hematuria   [facility] Valentino Nose, NP              Passed - Cr in normal range and within 360 days    Creat  Date Value Ref Range Status  07/01/2022 1.05 (H) 0.60 - 0.95 mg/dL Final   Creatinine, Ser  Date Value Ref Range Status  02/14/2023 0.90 0.44 - 1.00 mg/dL Final   Creatinine, Urine  Date Value Ref Range Status  03/08/2007 135.3 mg/dL Final         Passed - eGFR is 30 or above and within 360 days    GFR, Est African American  Date Value Ref Range Status  12/12/2018 66 > OR = 60 mL/min/1.81m2 Final   GFR calc Af Amer  Date Value Ref Range Status  07/10/2019 47 (L) >60 mL/min Final   GFR, Est Non African American  Date Value Ref Range Status  12/12/2018 57 (L) >  OR = 60 mL/min/1.98m2 Final   GFR, Estimated  Date Value Ref Range Status  02/14/2023 >60 >60 mL/min Final    Comment:    (NOTE) Calculated using the CKD-EPI Creatinine Equation (2021)    eGFR  Date Value Ref Range Status  07/01/2022 52 (L) > OR = 60 mL/min/1.69m2 Final         Passed - Completed PHQ-2 or PHQ-9 in the last 360 days      Passed - Last BP in normal range    BP Readings from Last 1 Encounters:  02/15/23 (!) 103/51

## 2023-02-18 ENCOUNTER — Encounter: Payer: Self-pay | Admitting: Internal Medicine

## 2023-02-19 NOTE — Anesthesia Postprocedure Evaluation (Signed)
Anesthesia Post Note  Patient: [name]  Procedure(s) Performed: ESOPHAGOGASTRODUODENOSCOPY (EGD) WITH PROPOFOL MALONEY DILATION BIOPSY  Patient location during evaluation: Phase II Anesthesia Type: General Level of consciousness: awake Pain management: pain level controlled Vital Signs Assessment: post-procedure vital signs reviewed and stable Respiratory status: spontaneous breathing and respiratory function stable Cardiovascular status: blood pressure returned to baseline and stable Postop Assessment: no headache and no apparent nausea or vomiting Anesthetic complications: no Comments: Late entry   No notable events documented.   Last Vitals:  Vitals:   02/15/23 1351 02/15/23 1401  BP: 102/62 (!) 103/51  Pulse: 87 83  Resp: 12 18  Temp: 36.5 C   SpO2: 99% 96%    Last Pain:  Vitals:   02/16/23 1152  TempSrc:   PainSc: 0-No pain                 Windell Norfolk

## 2023-02-22 ENCOUNTER — Encounter (HOSPITAL_COMMUNITY): Payer: Self-pay | Admitting: Internal Medicine

## 2023-03-07 ENCOUNTER — Telehealth: Payer: Self-pay

## 2023-03-07 ENCOUNTER — Other Ambulatory Visit: Payer: Self-pay

## 2023-03-07 DIAGNOSIS — G894 Chronic pain syndrome: Secondary | ICD-10-CM

## 2023-03-07 MED ORDER — DULOXETINE HCL 60 MG PO CPEP: 60.0000 mg | ORAL_CAPSULE | Freq: Every day | ORAL | 1 refills | Status: DC

## 2023-03-07 MED ORDER — DULOXETINE HCL 30 MG PO CPEP: 30.0000 mg | ORAL_CAPSULE | Freq: Every day | ORAL | 1 refills | Status: DC

## 2023-03-07 NOTE — Telephone Encounter (Signed)
[facility] pharmacy called in to request refills of these meds:  DULoxetine (CYMBALTA) 30 MG capsule [[id number]] DULoxetine (CYMBALTA) 60 MG capsule [[id number]]  LOV: 01/05/23  PHARMACY: [facility] - [address], [place] - [address] [place] [address] Phone: [phone]  Fax: [phone]  CB#: [phone]

## 2023-03-22 ENCOUNTER — Other Ambulatory Visit: Payer: Self-pay | Admitting: *Deleted

## 2023-03-22 DIAGNOSIS — I714 Abdominal aortic aneurysm, without rupture, unspecified: Secondary | ICD-10-CM

## 2023-03-27 ENCOUNTER — Telehealth: Payer: Self-pay

## 2023-03-27 NOTE — Telephone Encounter (Signed)
Copied from CRM [id number]. Topic: Clinical - Medical Advice >> Mar 27, 2023  8:34 AM Larwance Sachs wrote: Reason for CRM: Patient representative called in regarding scheduling patient an appointment due to a cold. Patient has bad cough with congestion and sore throat. Patient representative would like a call back regarding any over the counter medication he can give patient that will not effect patients current medication until patients appointment  Call back at [phone]

## 2023-03-31 ENCOUNTER — Encounter (HOSPITAL_BASED_OUTPATIENT_CLINIC_OR_DEPARTMENT_OTHER): Payer: Self-pay

## 2023-03-31 ENCOUNTER — Inpatient Hospital Stay (HOSPITAL_BASED_OUTPATIENT_CLINIC_OR_DEPARTMENT_OTHER)
Admission: EM | Admit: 2023-03-31 | Discharge: 2023-04-03 | DRG: 189 | Disposition: A | Discharge status: Home / Self Care | Payer: 59 | Attending: Internal Medicine | Admitting: Internal Medicine

## 2023-03-31 ENCOUNTER — Other Ambulatory Visit: Payer: Self-pay

## 2023-03-31 ENCOUNTER — Emergency Department (HOSPITAL_BASED_OUTPATIENT_CLINIC_OR_DEPARTMENT_OTHER): Payer: 59

## 2023-03-31 DIAGNOSIS — M8580 Other specified disorders of bone density and structure, unspecified site: Secondary | ICD-10-CM | POA: Diagnosis present

## 2023-03-31 DIAGNOSIS — G894 Chronic pain syndrome: Secondary | ICD-10-CM | POA: Diagnosis present

## 2023-03-31 DIAGNOSIS — E1140 Type 2 diabetes mellitus with diabetic neuropathy, unspecified: Secondary | ICD-10-CM | POA: Diagnosis present

## 2023-03-31 DIAGNOSIS — I2510 Atherosclerotic heart disease of native coronary artery without angina pectoris: Secondary | ICD-10-CM | POA: Diagnosis present

## 2023-03-31 DIAGNOSIS — N1831 Chronic kidney disease, stage 3a: Secondary | ICD-10-CM | POA: Diagnosis present

## 2023-03-31 DIAGNOSIS — Z1152 Encounter for screening for COVID-19: Secondary | ICD-10-CM

## 2023-03-31 DIAGNOSIS — E669 Obesity, unspecified: Secondary | ICD-10-CM | POA: Diagnosis present

## 2023-03-31 DIAGNOSIS — I130 Hypertensive heart and chronic kidney disease with heart failure and stage 1 through stage 4 chronic kidney disease, or unspecified chronic kidney disease: Secondary | ICD-10-CM | POA: Diagnosis present

## 2023-03-31 DIAGNOSIS — E1122 Type 2 diabetes mellitus with diabetic chronic kidney disease: Secondary | ICD-10-CM | POA: Diagnosis present

## 2023-03-31 DIAGNOSIS — J45909 Unspecified asthma, uncomplicated: Secondary | ICD-10-CM | POA: Diagnosis present

## 2023-03-31 DIAGNOSIS — Z8249 Family history of ischemic heart disease and other diseases of the circulatory system: Secondary | ICD-10-CM

## 2023-03-31 DIAGNOSIS — F0390 Unspecified dementia without behavioral disturbance: Secondary | ICD-10-CM | POA: Diagnosis present

## 2023-03-31 DIAGNOSIS — G4730 Sleep apnea, unspecified: Secondary | ICD-10-CM | POA: Diagnosis present

## 2023-03-31 DIAGNOSIS — Z96642 Presence of left artificial hip joint: Secondary | ICD-10-CM | POA: Diagnosis present

## 2023-03-31 DIAGNOSIS — J9601 Acute respiratory failure with hypoxia: Principal | ICD-10-CM | POA: Diagnosis present

## 2023-03-31 DIAGNOSIS — J9621 Acute and chronic respiratory failure with hypoxia: Principal | ICD-10-CM | POA: Diagnosis present

## 2023-03-31 DIAGNOSIS — Z6826 Body mass index (BMI) 26.0-26.9, adult: Secondary | ICD-10-CM

## 2023-03-31 DIAGNOSIS — J069 Acute upper respiratory infection, unspecified: Secondary | ICD-10-CM | POA: Diagnosis present

## 2023-03-31 DIAGNOSIS — I482 Chronic atrial fibrillation, unspecified: Secondary | ICD-10-CM | POA: Diagnosis present

## 2023-03-31 DIAGNOSIS — E878 Other disorders of electrolyte and fluid balance, not elsewhere classified: Secondary | ICD-10-CM | POA: Diagnosis present

## 2023-03-31 DIAGNOSIS — Z96651 Presence of right artificial knee joint: Secondary | ICD-10-CM | POA: Diagnosis present

## 2023-03-31 DIAGNOSIS — I5032 Chronic diastolic (congestive) heart failure: Secondary | ICD-10-CM | POA: Diagnosis present

## 2023-03-31 DIAGNOSIS — J189 Pneumonia, unspecified organism: Secondary | ICD-10-CM | POA: Diagnosis present

## 2023-03-31 DIAGNOSIS — F32A Depression, unspecified: Secondary | ICD-10-CM | POA: Diagnosis present

## 2023-03-31 DIAGNOSIS — G4733 Obstructive sleep apnea (adult) (pediatric): Secondary | ICD-10-CM | POA: Diagnosis present

## 2023-03-31 DIAGNOSIS — Z87442 Personal history of urinary calculi: Secondary | ICD-10-CM

## 2023-03-31 DIAGNOSIS — Z833 Family history of diabetes mellitus: Secondary | ICD-10-CM

## 2023-03-31 DIAGNOSIS — M069 Rheumatoid arthritis, unspecified: Secondary | ICD-10-CM | POA: Diagnosis present

## 2023-03-31 DIAGNOSIS — Z7982 Long term (current) use of aspirin: Secondary | ICD-10-CM

## 2023-03-31 DIAGNOSIS — R509 Fever, unspecified: Secondary | ICD-10-CM | POA: Diagnosis not present

## 2023-03-31 DIAGNOSIS — E785 Hyperlipidemia, unspecified: Secondary | ICD-10-CM | POA: Diagnosis present

## 2023-03-31 LAB — CBC WITH DIFFERENTIAL/PLATELET
Abs Immature Granulocytes: 0.05 10*3/uL (ref 0.00–0.07)
Basophils Absolute: 0.1 10*3/uL (ref 0.0–0.1)
Basophils Relative: 1 %
Eosinophils Absolute: 0.1 10*3/uL (ref 0.0–0.5)
Eosinophils Relative: 2 %
HCT: 38.3 % (ref 36.0–46.0)
Hemoglobin: 12.2 g/dL (ref 12.0–15.0)
Immature Granulocytes: 1 %
Lymphocytes Relative: 31 %
Lymphs Abs: 2.2 10*3/uL (ref 0.7–4.0)
MCH: 28.8 pg (ref 26.0–34.0)
MCHC: 31.9 g/dL (ref 30.0–36.0)
MCV: 90.3 fL (ref 80.0–100.0)
Monocytes Absolute: 0.5 10*3/uL (ref 0.1–1.0)
Monocytes Relative: 7 %
Neutro Abs: 4 10*3/uL (ref 1.7–7.7)
Neutrophils Relative %: 58 %
Platelets: 228 10*3/uL (ref 150–400)
RBC: 4.24 MIL/uL (ref 3.87–5.11)
RDW: 13.2 % (ref 11.5–15.5)
WBC: 6.9 10*3/uL (ref 4.0–10.5)
nRBC: 0 % (ref 0.0–0.2)

## 2023-03-31 LAB — BASIC METABOLIC PANEL
Anion gap: 8 (ref 5–15)
BUN: 16 mg/dL (ref 8–23)
CO2: 31 mmol/L (ref 22–32)
Calcium: 9 mg/dL (ref 8.9–10.3)
Chloride: 97 mmol/L — ABNORMAL LOW (ref 98–111)
Creatinine, Ser: 1 mg/dL (ref 0.44–1.00)
GFR, Estimated: 55 mL/min — ABNORMAL LOW (ref >60)
Glucose, Bld: 147 mg/dL — ABNORMAL HIGH (ref 70–99)
Potassium: 3.8 mmol/L (ref 3.5–5.1)
Sodium: 136 mmol/L (ref 135–145)

## 2023-03-31 LAB — RESP PANEL BY RT-PCR (RSV, FLU A&B, COVID)  RVPGX2
Influenza A by PCR: NEGATIVE
Influenza B by PCR: NEGATIVE
Resp Syncytial Virus by PCR: NEGATIVE
SARS Coronavirus 2 by RT PCR: NEGATIVE

## 2023-03-31 MED ORDER — DEXAMETHASONE SODIUM PHOSPHATE 10 MG/ML IJ SOLN
10.0000 mg | Freq: Once | INTRAMUSCULAR | Status: AC
  Administered 2023-03-31: 10 mg via INTRAVENOUS
  Filled 2023-03-31: qty 1

## 2023-03-31 MED ORDER — IPRATROPIUM-ALBUTEROL 0.5-2.5 (3) MG/3ML IN SOLN
3.0000 mL | Freq: Once | RESPIRATORY_TRACT | Status: AC
  Administered 2023-03-31: 3 mL via RESPIRATORY_TRACT
  Filled 2023-03-31: qty 3

## 2023-03-31 MED ORDER — AZITHROMYCIN 250 MG PO TABS: 500.0000 mg | ORAL_TABLET | Freq: Once | ORAL | Status: DC

## 2023-03-31 MED ORDER — AMOXICILLIN-POT CLAVULANATE 875-125 MG PO TABS: 1.0000 | ORAL_TABLET | Freq: Two times a day (BID) | ORAL | 0 refills | Status: DC

## 2023-03-31 MED ORDER — SODIUM CHLORIDE 0.9 % IV SOLN
500.0000 mg | Freq: Once | INTRAVENOUS | Status: AC
  Administered 2023-03-31: 500 mg via INTRAVENOUS
  Filled 2023-03-31: qty 5

## 2023-03-31 MED ORDER — AZITHROMYCIN 250 MG PO TABS: 250.0000 mg | ORAL_TABLET | Freq: Every day | ORAL | 0 refills | Status: DC

## 2023-03-31 MED ORDER — SODIUM CHLORIDE 0.9 % IV SOLN
1.0000 g | Freq: Once | INTRAVENOUS | Status: AC
  Administered 2023-03-31: 1 g via INTRAVENOUS
  Filled 2023-03-31: qty 10

## 2023-03-31 MED ORDER — AMOXICILLIN-POT CLAVULANATE 875-125 MG PO TABS: 1.0000 | ORAL_TABLET | Freq: Once | ORAL | Status: DC

## 2023-03-31 NOTE — ED Triage Notes (Signed)
Pt to triage c/o generalized body aches with cough x 1 week. Pt states she has had no energy and has been sleeping more than normal. Pt on room air VSS NAD

## 2023-03-31 NOTE — ED Provider Notes (Signed)
[place] EMERGENCY DEPARTMENT AT [facility] Provider Note   CSN: [id number] Arrival date & time: 03/31/23  2108     History  Chief Complaint  Patient presents with   Generalized Body Aches    [name] is a 89 y.o. adult past medical history significant for A-fib presents today for cough generalized bodyaches x 5 days.  Patient endorses fatigue, chills, congestion and productive cough.  Patient denies nausea, vomiting, diarrhea, sore throat, or abdominal pain.  HPI     Home Medications Prior to Admission medications   Medication Sig Start Date End Date Taking? Authorizing Provider  acetaminophen (TYLENOL) 325 MG tablet Take 2 tablets (650 mg total) by mouth every 6 (six) hours. 09/01/20   Johnson, Clanford L, MD  albuterol (VENTOLIN HFA) 108 (90 Base) MCG/ACT inhaler Inhale 2 puffs into the lungs every 4 (four) hours as needed for wheezing or shortness of breath. 02/01/23   Donita Brooks, MD  ammonium lactate (AMLACTIN) 12 % cream Apply topically as needed for dry skin. 08/17/18   Vivi Barrack, DPM  aspirin EC 81 MG tablet Take 1 po BID x 6 weeks then resume 1 po daily 09/01/20   Standley Dakins L, MD  atenolol (TENORMIN) 25 MG tablet TAKE 1/2 TABLET BY MOUTH DAILY 07/27/22   Donita Brooks, MD  Calcium Carbonate-Vitamin D3 600-400 MG-UNIT TABS Take 1 tablet by mouth daily. 02/04/20   [provider]  clotrimazole-betamethasone (LOTRISONE) cream Apply 1 Application topically daily. 02/01/23   Donita Brooks, MD  diclofenac sodium (VOLTAREN) 1 % GEL Apply 2 g topically 4 (four) times daily. Rub into affected area of foot 2 to 4 times daily 08/17/18   Vivi Barrack, DPM  DULoxetine (CYMBALTA) 30 MG capsule Take 1 capsule (30 mg total) by mouth daily. 03/07/23   Donita Brooks, MD  DULoxetine (CYMBALTA) 60 MG capsule Take 1 capsule (60 mg total) by mouth daily. Take in addition to the Cymbalta 30 mg to equal a total dose of 90 mg per day.  03/07/23   Donita Brooks, MD  ezetimibe (ZETIA) 10 MG tablet TAKE 1 TABLET (10 MG TOTAL) BY MOUTH DAILY. 09/19/22   Donita Brooks, MD  gabapentin (NEURONTIN) 800 MG tablet TAKE 1 TABLET BY MOUTH 3 TIMES DAILY 10/20/22   Donita Brooks, MD  ketoconazole (NIZORAL) 2 % cream Apply 1 application topically daily. 02/25/21   [provider]  mupirocin ointment (BACTROBAN) 2 % Apply 1 application  topically 2 (two) times daily. 02/25/21   [provider]  pantoprazole (PROTONIX) 40 MG tablet TAKE 1 TABLET BY MOUTH ONCE DAILY 01/20/23   Donita Brooks, MD  polyethylene glycol (MIRALAX / GLYCOLAX) 17 g packet Take 17 g by mouth daily as needed for mild constipation or moderate constipation.    [provider]  rosuvastatin (CRESTOR) 40 MG tablet TAKE 1 TABLET BY MOUTH IN THE EVENING 01/20/23   Donita Brooks, MD      Allergies    Sulfonamide derivatives    Review of Systems   Review of Systems  Constitutional:  Positive for chills and fatigue.  HENT:  Positive for congestion.   Respiratory:  Positive for cough.     Physical Exam Updated Vital Signs BP 117/66   Pulse 86   Temp 98.1 F (36.7 C) (Oral)   Resp (!) 21   Wt 72.8 kg   SpO2 92%   BMI 26.71 kg/m  Physical Exam  Vitals and nursing note reviewed.  Constitutional:      General: He is not in acute distress.    Appearance: Normal appearance. He is well-developed.  HENT:     Head: Normocephalic and atraumatic.     Nose: Nose normal.     Mouth/Throat:     Mouth: Mucous membranes are moist.  Eyes:     Conjunctiva/sclera: Conjunctivae normal.  Cardiovascular:     Rate and Rhythm: Normal rate and regular rhythm.     Heart sounds: No murmur heard. Pulmonary:     Effort: Pulmonary effort is normal. No respiratory distress.     Breath sounds: Rhonchi present.  Abdominal:     Palpations: Abdomen is soft.     Tenderness: There is no abdominal tenderness.  Musculoskeletal:        General: No  swelling.     Cervical back: Neck supple. No rigidity.  Skin:    General: Skin is warm and dry.     Capillary Refill: Capillary refill takes less than 2 seconds.  Neurological:     General: No focal deficit present.     Mental Status: He is alert.  Psychiatric:        Mood and Affect: Mood normal.     ED Results / Procedures / Treatments   Labs (all labs ordered are listed, but only abnormal results are displayed) Labs Reviewed  BASIC METABOLIC PANEL - Abnormal; Notable for the following components:      Result Value   Chloride 97 (*)    Glucose, Bld 147 (*)    GFR, Estimated 55 (*)    All other components within normal limits  RESP PANEL BY RT-PCR (RSV, FLU A&B, COVID)  [id number]  CBC WITH DIFFERENTIAL/PLATELET    EKG None  Radiology DG Chest Portable 1 View  Result Date: 03/31/2023 CLINICAL DATA:  Coughing and body aches for 1 week. EXAM: PORTABLE CHEST 1 VIEW COMPARISON:  PA and lateral chest 10/14/2021 FINDINGS: The heart is moderately enlarged but unchanged. No vascular congestion is seen. There are bilateral minimal pleural effusions. The lungs are otherwise clear. There is a stable mediastinum with tortuous and calcified aorta. There is osteopenia with degenerative changes of the spine and shoulders and right shoulder os acromiale. No new osseous abnormality. IMPRESSION: 1. Cardiomegaly with minimal bilateral pleural effusions. No overt CHF findings. 2. No other evidence of acute chest disease. 3. Aortic atherosclerosis. Electronically Signed   By: Almira Bar M.D.   On: 03/31/2023 22:55    Procedures Procedures    Medications Ordered in ED Medications  cefTRIAXone (ROCEPHIN) 1 g in sodium chloride 0.9 % 100 mL IVPB (has no administration in time range)  azithromycin (ZITHROMAX) 500 mg in sodium chloride 0.9 % 250 mL IVPB (has no administration in time range)  ipratropium-albuterol (DUONEB) 0.5-2.5 (3) MG/3ML nebulizer solution 3 mL (3 mLs Nebulization Given  03/31/23 2301)    ED Course/ Medical Decision Making/ A&P                                 Medical Decision Making Amount and/or Complexity of Data Reviewed Labs: ordered. Radiology: ordered.  Risk Prescription drug management.   This patient presents to the ED with chief complaint(s) of cough and generalized bodyaches with pertinent past medical history of none which further complicates the presenting complaint. The complaint involves an extensive differential diagnosis and also carries with it a high risk  of complications and morbidity.    The differential diagnosis includes COVID, flu, pneumonia, upper respiratory infection  Additional history obtained: Records reviewed Primary Care Documents  ED Course and Reassessment: Patient satting in the mid 80s on room air while resting in bed.  Patient placed on 2 L nasal cannula satting around 95%.  Independent labs interpretation:  The following labs were independently interpreted:  CBC: No notable findings BMP: Mild hypochloremia, mildly decreased GFR Respiratory panel: None  Independent visualization of imaging: - I independently visualized the following imaging with scope of interpretation limited to determining acute life threatening conditions related to emergency care: Chest x-ray, which revealed cardiomegaly with minimal bilateral pleural effusions  Consultation: - Consulted or discussed management/test interpretation w/ external professional: Hospitalist, Dr. Arlean Hopping who agreed to admission for acute hypoxic respiratory failure  Consideration for admission or further workup: Admission for acute hypoxic respiratory failure        Final Clinical Impression(s) / ED Diagnoses Final diagnoses:  Community acquired pneumonia, unspecified laterality    Rx / DC Orders ED Discharge Orders          Ordered    amoxicillin-clavulanate (AUGMENTIN) 875-125 MG tablet  Every 12 hours,   Status:  Discontinued        03/31/23  2307    azithromycin (ZITHROMAX) 250 MG tablet  Daily,   Status:  Discontinued        03/31/23 2307              Dolphus Jenny, PA-C 03/31/23 [id number]    Benjiman Core, MD 03/31/23 2350

## 2023-03-31 NOTE — ED Triage Notes (Signed)
PT denies CP/SOB 

## 2023-03-31 NOTE — ED Notes (Signed)
RT placed on [place] 2 Lpm for desaturation into the mid 80's. Pt respiratory status stable at this time on [place] 2 Lpm w/no distress noted. BLBS rhonchi/dim. RT will continue to monitor while at [facility].    03/31/23 2310  Vitals  Pulse Rate 86  Pulse Rate Source Monitor  Resp (!) 21  Respiratory Pattern Regular;Unlabored;Symmetrical  SpO2 92 %  Oxygen Therapy  O2 Device Nasal Cannula  O2 Flow Rate (L/min) 2 L/min  FiO2 (%) 28 %

## 2023-04-01 DIAGNOSIS — J9601 Acute respiratory failure with hypoxia: Secondary | ICD-10-CM

## 2023-04-01 DIAGNOSIS — Z8249 Family history of ischemic heart disease and other diseases of the circulatory system: Secondary | ICD-10-CM | POA: Diagnosis not present

## 2023-04-01 DIAGNOSIS — N1831 Chronic kidney disease, stage 3a: Secondary | ICD-10-CM | POA: Diagnosis present

## 2023-04-01 DIAGNOSIS — E878 Other disorders of electrolyte and fluid balance, not elsewhere classified: Secondary | ICD-10-CM | POA: Diagnosis present

## 2023-04-01 DIAGNOSIS — I5032 Chronic diastolic (congestive) heart failure: Secondary | ICD-10-CM | POA: Diagnosis present

## 2023-04-01 DIAGNOSIS — J45909 Unspecified asthma, uncomplicated: Secondary | ICD-10-CM | POA: Diagnosis present

## 2023-04-01 DIAGNOSIS — J069 Acute upper respiratory infection, unspecified: Secondary | ICD-10-CM | POA: Diagnosis present

## 2023-04-01 DIAGNOSIS — F32A Depression, unspecified: Secondary | ICD-10-CM | POA: Diagnosis present

## 2023-04-01 DIAGNOSIS — Z96651 Presence of right artificial knee joint: Secondary | ICD-10-CM | POA: Diagnosis present

## 2023-04-01 DIAGNOSIS — Z1152 Encounter for screening for COVID-19: Secondary | ICD-10-CM | POA: Diagnosis not present

## 2023-04-01 DIAGNOSIS — E1122 Type 2 diabetes mellitus with diabetic chronic kidney disease: Secondary | ICD-10-CM | POA: Diagnosis present

## 2023-04-01 DIAGNOSIS — E669 Obesity, unspecified: Secondary | ICD-10-CM | POA: Diagnosis present

## 2023-04-01 DIAGNOSIS — J189 Pneumonia, unspecified organism: Secondary | ICD-10-CM | POA: Diagnosis present

## 2023-04-01 DIAGNOSIS — G4730 Sleep apnea, unspecified: Secondary | ICD-10-CM | POA: Diagnosis present

## 2023-04-01 DIAGNOSIS — I2510 Atherosclerotic heart disease of native coronary artery without angina pectoris: Secondary | ICD-10-CM | POA: Diagnosis present

## 2023-04-01 DIAGNOSIS — E785 Hyperlipidemia, unspecified: Secondary | ICD-10-CM | POA: Diagnosis present

## 2023-04-01 DIAGNOSIS — J9621 Acute and chronic respiratory failure with hypoxia: Secondary | ICD-10-CM | POA: Diagnosis present

## 2023-04-01 DIAGNOSIS — Z6826 Body mass index (BMI) 26.0-26.9, adult: Secondary | ICD-10-CM | POA: Diagnosis not present

## 2023-04-01 DIAGNOSIS — I130 Hypertensive heart and chronic kidney disease with heart failure and stage 1 through stage 4 chronic kidney disease, or unspecified chronic kidney disease: Secondary | ICD-10-CM | POA: Diagnosis present

## 2023-04-01 DIAGNOSIS — G894 Chronic pain syndrome: Secondary | ICD-10-CM | POA: Diagnosis present

## 2023-04-01 DIAGNOSIS — E1140 Type 2 diabetes mellitus with diabetic neuropathy, unspecified: Secondary | ICD-10-CM | POA: Diagnosis present

## 2023-04-01 DIAGNOSIS — Z96642 Presence of left artificial hip joint: Secondary | ICD-10-CM | POA: Diagnosis present

## 2023-04-01 DIAGNOSIS — M069 Rheumatoid arthritis, unspecified: Secondary | ICD-10-CM | POA: Diagnosis present

## 2023-04-01 DIAGNOSIS — I482 Chronic atrial fibrillation, unspecified: Secondary | ICD-10-CM | POA: Diagnosis present

## 2023-04-01 DIAGNOSIS — F0390 Unspecified dementia without behavioral disturbance: Secondary | ICD-10-CM | POA: Diagnosis present

## 2023-04-01 DIAGNOSIS — R509 Fever, unspecified: Secondary | ICD-10-CM | POA: Diagnosis present

## 2023-04-01 LAB — CBC
HCT: 37.3 % (ref 36.0–46.0)
Hemoglobin: 11.8 g/dL — ABNORMAL LOW (ref 12.0–15.0)
MCH: 28.3 pg (ref 26.0–34.0)
MCHC: 31.6 g/dL (ref 30.0–36.0)
MCV: 89.4 fL (ref 80.0–100.0)
Platelets: 216 10*3/uL (ref 150–400)
RBC: 4.17 MIL/uL (ref 3.87–5.11)
RDW: 13.2 % (ref 11.5–15.5)
WBC: 6.6 10*3/uL (ref 4.0–10.5)
nRBC: 0 % (ref 0.0–0.2)

## 2023-04-01 LAB — RESPIRATORY PANEL BY PCR

## 2023-04-01 LAB — CREATININE, SERUM
Creatinine, Ser: 1.07 mg/dL — ABNORMAL HIGH (ref 0.44–1.00)
GFR, Estimated: 50 mL/min — ABNORMAL LOW (ref >60)

## 2023-04-01 LAB — PROCALCITONIN: Procalcitonin: <0.1 ng/mL

## 2023-04-01 LAB — BRAIN NATRIURETIC PEPTIDE: B Natriuretic Peptide: 154.5 pg/mL — ABNORMAL HIGH (ref 0.0–100.0)

## 2023-04-01 LAB — LACTIC ACID, PLASMA
Lactic Acid, Venous: 2.3 mmol/L (ref 0.5–1.9)
Lactic Acid, Venous: 1.9 mmol/L (ref 0.5–1.9)

## 2023-04-01 LAB — C-REACTIVE PROTEIN: CRP: 7.2 mg/dL — ABNORMAL HIGH (ref <1.0)

## 2023-04-01 LAB — CK: Total CK: 34 U/L — ABNORMAL LOW (ref 38–234)

## 2023-04-01 MED ORDER — DULOXETINE HCL 60 MG PO CPEP
60.0000 mg | ORAL_CAPSULE | Freq: Every day | ORAL | Status: DC
  Administered 2023-04-01 – 2023-04-03 (×3): 60 mg via ORAL
  Filled 2023-04-01 (×3): qty 1

## 2023-04-01 MED ORDER — EZETIMIBE 10 MG PO TABS
10.0000 mg | ORAL_TABLET | Freq: Every day | ORAL | Status: DC
  Administered 2023-04-01 – 2023-04-03 (×3): 10 mg via ORAL
  Filled 2023-04-01 (×3): qty 1

## 2023-04-01 MED ORDER — ATENOLOL 25 MG PO TABS
12.5000 mg | ORAL_TABLET | Freq: Every day | ORAL | Status: DC
  Administered 2023-04-02 – 2023-04-03 (×2): 12.5 mg via ORAL
  Filled 2023-04-01 (×3): qty 0.5

## 2023-04-01 MED ORDER — ACETAMINOPHEN 325 MG PO TABS: 650.0000 mg | ORAL_TABLET | Freq: Four times a day (QID) | ORAL | Status: DC | PRN

## 2023-04-01 MED ORDER — SODIUM CHLORIDE 0.9% FLUSH
3.0000 mL | Freq: Two times a day (BID) | INTRAVENOUS | Status: DC
  Administered 2023-04-01 – 2023-04-03 (×5): 3 mL via INTRAVENOUS

## 2023-04-01 MED ORDER — POTASSIUM CHLORIDE IN NACL 20-0.9 MEQ/L-% IV SOLN
INTRAVENOUS | Status: DC
  Filled 2023-04-01: qty 1000

## 2023-04-01 MED ORDER — METHYLPREDNISOLONE SODIUM SUCC 40 MG IJ SOLR: 40.0000 mg | Freq: Two times a day (BID) | INTRAMUSCULAR | Status: DC

## 2023-04-01 MED ORDER — ROSUVASTATIN CALCIUM 20 MG PO TABS
40.0000 mg | ORAL_TABLET | Freq: Every evening | ORAL | Status: DC
  Administered 2023-04-01 – 2023-04-02 (×2): 40 mg via ORAL
  Filled 2023-04-01 (×2): qty 2

## 2023-04-01 MED ORDER — ACETAMINOPHEN 650 MG RE SUPP: 650.0000 mg | Freq: Four times a day (QID) | RECTAL | Status: DC | PRN

## 2023-04-01 MED ORDER — ALBUTEROL SULFATE (2.5 MG/3ML) 0.083% IN NEBU: 2.5000 mg | INHALATION_SOLUTION | RESPIRATORY_TRACT | Status: DC | PRN

## 2023-04-01 MED ORDER — ONDANSETRON HCL 4 MG/2ML IJ SOLN: 4.0000 mg | Freq: Four times a day (QID) | INTRAMUSCULAR | Status: DC | PRN

## 2023-04-01 MED ORDER — ASPIRIN 81 MG PO TBEC
81.0000 mg | DELAYED_RELEASE_TABLET | Freq: Every day | ORAL | Status: DC
  Administered 2023-04-01 – 2023-04-03 (×3): 81 mg via ORAL
  Filled 2023-04-01 (×3): qty 1

## 2023-04-01 MED ORDER — METHYLPREDNISOLONE SODIUM SUCC 40 MG IJ SOLR
40.0000 mg | INTRAMUSCULAR | Status: DC
  Administered 2023-04-01 – 2023-04-03 (×3): 40 mg via INTRAVENOUS
  Filled 2023-04-01 (×3): qty 1

## 2023-04-01 MED ORDER — ONDANSETRON HCL 4 MG PO TABS: 4.0000 mg | ORAL_TABLET | Freq: Four times a day (QID) | ORAL | Status: DC | PRN

## 2023-04-01 MED ORDER — SODIUM CHLORIDE 0.9 % IV SOLN
500.0000 mg | INTRAVENOUS | Status: DC
  Administered 2023-04-01 – 2023-04-02 (×2): 500 mg via INTRAVENOUS
  Filled 2023-04-01 (×2): qty 5

## 2023-04-01 MED ORDER — SODIUM CHLORIDE 0.9 % IV SOLN
1.0000 g | INTRAVENOUS | Status: DC
  Administered 2023-04-01 – 2023-04-02 (×2): 1 g via INTRAVENOUS
  Filled 2023-04-01 (×2): qty 10

## 2023-04-01 MED ORDER — ENOXAPARIN SODIUM 40 MG/0.4ML IJ SOSY
40.0000 mg | PREFILLED_SYRINGE | INTRAMUSCULAR | Status: DC
  Administered 2023-04-01 – 2023-04-03 (×3): 40 mg via SUBCUTANEOUS
  Filled 2023-04-01 (×3): qty 0.4

## 2023-04-01 NOTE — H&P (Addendum)
History and Physical    Patient: [name] MVH:[id number] DOB: 13-Aug-1933 DOA: 03/31/2023 DOS: the patient was seen and examined on 04/01/2023 PCP: Donita Brooks, MD  Patient coming from: Home Medical readiness/disposition: Anticipate patient will be ready for discharge in the next 2 to 3 days.  Plan to discharge back to home environment  Chief Complaint:  Chief Complaint  Patient presents with   Generalized Body Aches   HPI: [name] is a 89 y.o. adult with medical history significant of dyslipidemia, neuropathy and chronic back pain, hypertension, asthma, sleep apnea, atrial fibrillation not on anticoagulation, HFpEF, CAD and CKD 3.  She presented to the ED with reports of generalized bodyaches excessive sleeping, chills, subjective fevers congestion and cough for 5 days.  No nausea or vomiting.  In the ED she was afebrile, no leukocytosis, drowsy, hemodynamically stable and maintaining atrial fibrillation with ventricular rates in the 60s to 80s, she was hypoxemic with room air sats in the mid 80s and required oxygen at 2 L/min.  Labs were unremarkable and she did not have any leukocytosis.  Respiratory PCR was negative for flu, RSV or COVID.  Chest x-ray without any signs of acute infection and just minimal bilateral pleural effusions.  Hospitalist has been asked to evaluate the patient for admission.  Patient has been transferred from [facility] to a room at [facility].  Upon my evaluation of the patient she was drowsy but would awaken and answer questions appropriately.  She was complaining of feeling very tired and was not hungry.  Currently not coughing or having any difficulty breathing or increased work of breathing.   Review of Systems: As mentioned in the history of present illness. All other systems reviewed and are negative. Past Medical History:  Diagnosis Date   [name] (abdominal aortic aneurysm) (HCC) 09/2013   3.6 cm   Acute and chronic  respiratory failure with hypoxia (HCC)    Acute delirium    Acute encephalopathy    Acute pyelonephritis    Atrial fibrillation (HCC)    Chronic pain syndrome    Constipation    Dementia (HCC)    Dependent edema    bilateral legs    Depression    Diabetes mellitus    pre-diabetes   Diverticulosis    Diverticulosis    Dysrhythmia    Dysuria    E coli bacteremia    Fall    Gross hematuria    Hydronephrosis with renal and ureteral calculous obstruction    Hyperlipidemia    Hypertension    Hypoxemia    Idiopathic peripheral autonomic neuropathy    Infestation by bed bug    Nephrolithiasis    OSA on CPAP    Osteoarthritis    Osteopenia    T=  -2.1 in hip   Persistent mood (affective) disorder, unspecified (HCC)    Prediabetes    Rheumatoid arthritis (HCC)    Rheumatoid arthritis(714.0)    S/P carpal tunnel release    Sepsis secondary to UTI [facility])    Spinal stenosis    Unspecified fracture of fifth lumbar vertebra, initial encounter for closed fracture (HCC)    Unspecified fracture of t11-T12 vertebra, initial encounter for closed fracture [facility])    Urinary incontinence    Urinary tract infection    hx of    Urolithiasis    Past Surgical History:  Procedure Laterality Date   APPENDECTOMY     BIOPSY  02/15/2023   Procedure: BIOPSY;  Surgeon:  Corbin Ade, MD;  Location: [facility];  Service: Endoscopy;;   CARPAL TUNNEL RELEASE  12/27/2006   Right subcutaneous ulnar nerve transfer -- Right open carpal tunnel release   CHOLECYSTECTOMY     CYSTOSCOPY W/ URETERAL STENT PLACEMENT Right 01/30/2018   Procedure: CYSTOSCOPY WITH STENT REPLACEMENT retrograde pylegram;  Surgeon: Crist Fat, MD;  Location: [facility];  Service: Urology;  Laterality: Right;   CYSTOSCOPY WITH URETEROSCOPY AND STENT PLACEMENT Right 03/01/2018   Procedure: CYSTOSCPY/RIGHT URETEROSCOPY HOLMIUM LASER LITHOTRIPSY AND STENT EXCHANGE;  Surgeon: Crist Fat, MD;  Location: [facility];   Service: Urology;  Laterality: Right;   ESOPHAGOGASTRODUODENOSCOPY (EGD) WITH PROPOFOL N/A 02/15/2023   Procedure: ESOPHAGOGASTRODUODENOSCOPY (EGD) WITH PROPOFOL;  Surgeon: Corbin Ade, MD;  [facility];  Service: Endoscopy;  Laterality: N/A;  2:15 pm, asa 3/4   FOOT SURGERY     HIP ARTHROPLASTY Left 08/27/2020   Procedure: ARTHROPLASTY BIPOLAR HIP (HEMIARTHROPLASTY);  Surgeon: Oliver Barre, MD;  Location: [facility];  Service: Orthopedics;  Laterality: Left;   HOLMIUM LASER APPLICATION Right 03/01/2018   Procedure: HOLMIUM LASER APPLICATION;  Surgeon: Crist Fat, MD;  Location: [facility];  Service: Urology;  Laterality: Right;   JOINT REPLACEMENT     BTKR, RTHR   MALONEY DILATION N/A 02/15/2023   Procedure: MALONEY DILATION;  Surgeon: Corbin Ade, MD;  Location[facility];  Service: Endoscopy;  Laterality: N/A;   TOTAL KNEE ARTHROPLASTY  08/21/2007   Right total knee replacement   Social History:  reports that he has never smoked. He has never used smokeless tobacco. He reports that he does not drink alcohol and does not use drugs.  Allergies  Allergen Reactions   Sulfonamide Derivatives Hives and Rash    Family History  Problem Relation Age of Onset   Stroke Mother    Stroke Father    Hypertension Sister    Diabetes Brother    Coronary artery disease Son        CABG at age 91   Heart disease Son    Heart attack Son    Hypertension Maternal Aunt    Cancer Daughter    Colon cancer Neg Hx     Prior to Admission medications   Medication Sig Start Date End Date Taking? Authorizing Provider  acetaminophen (TYLENOL) 325 MG tablet Take 2 tablets (650 mg total) by mouth every 6 (six) hours. 09/01/20   Johnson, Clanford L, MD  albuterol (VENTOLIN HFA) 108 (90 Base) MCG/ACT inhaler Inhale 2 puffs into the lungs every 4 (four) hours as needed for wheezing or shortness of breath. 02/01/23   Donita Brooks, MD  ammonium lactate (AMLACTIN) 12 % cream Apply  topically as needed for dry skin. 08/17/18   Vivi Barrack, DPM  aspirin EC 81 MG tablet Take 1 po BID x 6 weeks then resume 1 po daily 09/01/20   Standley Dakins L, MD  atenolol (TENORMIN) 25 MG tablet TAKE 1/2 TABLET BY MOUTH DAILY 07/27/22   Donita Brooks, MD  Calcium Carbonate-Vitamin D3 600-400 MG-UNIT TABS Take 1 tablet by mouth daily. 02/04/20   [provider]  clotrimazole-betamethasone (LOTRISONE) cream Apply 1 Application topically daily. 02/01/23   Donita Brooks, MD  diclofenac sodium (VOLTAREN) 1 % GEL Apply 2 g topically 4 (four) times daily. Rub into affected area of foot 2 to 4 times daily 08/17/18   Vivi Barrack, DPM  DULoxetine (CYMBALTA) 30 MG capsule Take 1  capsule (30 mg total) by mouth daily. 03/07/23   Donita Brooks, MD  DULoxetine (CYMBALTA) 60 MG capsule Take 1 capsule (60 mg total) by mouth daily. Take in addition to the Cymbalta 30 mg to equal a total dose of 90 mg per day. 03/07/23   Donita Brooks, MD  ezetimibe (ZETIA) 10 MG tablet TAKE 1 TABLET (10 MG TOTAL) BY MOUTH DAILY. 09/19/22   Donita Brooks, MD  gabapentin (NEURONTIN) 800 MG tablet TAKE 1 TABLET BY MOUTH 3 TIMES DAILY 10/20/22   Donita Brooks, MD  ketoconazole (NIZORAL) 2 % cream Apply 1 application topically daily. 02/25/21   [provider]  mupirocin ointment (BACTROBAN) 2 % Apply 1 application  topically 2 (two) times daily. 02/25/21   [provider]  pantoprazole (PROTONIX) 40 MG tablet TAKE 1 TABLET BY MOUTH ONCE DAILY 01/20/23   Donita Brooks, MD  polyethylene glycol (MIRALAX / GLYCOLAX) 17 g packet Take 17 g by mouth daily as needed for mild constipation or moderate constipation.    [provider]  rosuvastatin (CRESTOR) 40 MG tablet TAKE 1 TABLET BY MOUTH IN THE EVENING 01/20/23   Donita Brooks, MD    Physical Exam: Vitals:   04/01/23 0100 04/01/23 0200 04/01/23 0600 04/01/23 0848  BP: 106/81 101/66 123/87 97/61  Pulse: 85 76 82    Resp: 20 18 18 15   Temp:  98.4 F (36.9 C) 98.1 F (36.7 C) 98 F (36.7 C)  TempSrc:  Oral Oral Oral  SpO2: 95% 94% 95% 96%  Weight:  75.6 kg    Height:  4\' 11"  (1.499 m)     Constitutional: NAD, calm, comfortable Respiratory: Coarse to auscultation with expiratory crackles.  No increased work of breathing.  Moving air adequately through all lung fields.  2 L of oxygen. Cardiovascular: Irregular rate and rhythm, no murmurs / rubs / gallops. No extremity edema. 2+ pedal pulses. .  Abdomen: no tenderness, no masses palpated. No hepatosplenomegaly. Bowel sounds positive.  Musculoskeletal: no clubbing / cyanosis. No joint deformity upper and lower extremities. Good ROM, no contractures. Normal muscle tone.  Skin: no rashes, lesions, ulcers. No induration-mostly warm and dry although skin was moist on her back from where she had been sleeping on her back covered with blankets. Neurologic: CN 2-12 grossly intact. Sensation intact,  Strength 5/5 x all 4 extremities.  Psychiatric: Drowsy but oriented x 3.   Data Reviewed:  Initial sodium 136, potassium 3.8, chloride 97, CO2 31, glucose 147, BUN 16, creatinine 1, GFR 55-repeat a.m. electrolytes are pending  White count 6900, hemoglobin 12.2, platelets 228,000, normal differential, repeat CBC this a.m. essentially unchanged  Respiratory PCR for influenza, RSV and COVID-negative  Chest x-ray as above  Assessment and Plan: Acute hypoxemic respiratory failure secondary to URI (viral versus early bacterial) Continue supportive care with oxygen Empiric IV Rocephin and Zithromax for possible early CAP Obtain 20 pathogen respiratory viral panel, check procalcitonin and CRP Treat underlying causes as above Patient not eating well and remains drowsy-similar symptoms at home for 5 days so suspect she likely is volume depleted so we will utilize normal saline with potassium at 100 cc/h for the next 24 hours **Lactic acid found to be elevated at  2.3**  Acute altered mental status Likely due to acute infection as above Obtain urinalysis and culture Patient has been weak at home for 5 days so obtain PT evaluation  History of asthma Currently not wheezing Albuterol as needed Solu-Medrol  40 mg IV daily  Sleep apnea Very drowsy so we will check ETCO2 and if necessary obtain an ABG  CKD 3A Avoid nephrotoxins Follow labs  Chronic atrial fibrillation Currently rate controlled Not on anticoagulation prior to admission  Dyslipidemia Continue statin  Hypertension/HFpEF Current blood pressure suboptimal Hold any antihypertensive medications and administer IV fluids as above  Chronic back pain/neuropathy Continue gabapentin and Cymbalta  CAD Currently asymptomatic       Advance Care Planning:   Code Status: Full Code   VTE prophylaxis: Lovenox  Consults: None  Family Communication: Patient only  Severity of Illness: The appropriate patient status for this patient is INPATIENT. Inpatient status is judged to be reasonable and necessary in order to provide the required intensity of service to ensure the patient's safety. The patient's presenting symptoms, physical exam findings, and initial radiographic and laboratory data in the context of their chronic comorbidities is felt to place them at high risk for further clinical deterioration. Furthermore, it is not anticipated that the patient will be medically stable for discharge from the hospital within 2 midnights of admission.   * I certify that at the point of admission it is my clinical judgment that the patient will require inpatient hospital care spanning beyond 2 midnights from the point of admission due to high intensity of service, high risk for further deterioration and high frequency of surveillance required.*  Author: Junious Silk, NP 04/01/2023 9:35 AM  For on call review [email or web address].

## 2023-04-01 NOTE — Evaluation (Signed)
Physical Therapy Evaluation Patient Details Name: [name] MRN: [id number] DOB: June 05, 1933 Today's Date: 04/01/2023  History of Present Illness  89 y.o. female presents from [facility] ED to [facility] 04/01/23 w/ generalized body aches x5 days and cough. Workup for acute hypoxic respiratory distress complicated by potential acute asthma exacerbation, in setting of potential viral respiratory infection. PMHx:  Acute on chronic respiratory failure with hypoxia, afib, dementia, DM, HTN, OA, RA, B TKA, R THA   Clinical Impression  Pt in bed upon arrival and agreeable to PT eval. Prior to admission, pt needed physical assistance with all mobility and all ADLs. Pt reported ambulating ~20 feet to the bathroom on good days. Pt has 24/7 care from family and PCA for 4 hours a day. Pt was able to stand with ModA and RW and march in place. Pt felt fatigued after marches with daughter also recommending that the pt rest. Pt presents to therapy session with decreased strength, balance, and activity tolerance. Pt would benefit from acute skilled PT to address functional impairments. Recommending post-acute HHPT to work on improving patients quality of life and level of independence. Acute PT to follow.          If plan is discharge home, recommend the following: A little help with walking and/or transfers;A little help with bathing/dressing/bathroom;Assistance with cooking/housework;Assistance with feeding;Direct supervision/assist for medications management;Direct supervision/assist for financial management;Assist for transportation;Help with stairs or ramp for entrance   Can travel by private vehicle    No    Equipment Recommendations None recommended by PT  Recommendations for Other Services  OT consult    Functional Status Assessment Patient has had a recent decline in their functional status and demonstrates the ability to make significant improvements in function in a reasonable and predictable amount of  time.     Precautions / Restrictions Precautions Precautions: Fall Restrictions Weight Bearing Restrictions: No      Mobility  Bed Mobility Overal bed mobility: Needs Assistance Bed Mobility: Supine to Sit, Sit to Supine     Supine to sit: Min assist, HOB elevated, Used rails Sit to supine: Min assist, HOB elevated, Used rails   General bed mobility comments: MinA for supine/sit w/ 1HH to complete trunk elevation. Needed MinA to complete moving B LE's into bed    Transfers Overall transfer level: Needs assistance Equipment used: Rolling walker (2 wheels) Transfers: Sit to/from Stand Sit to Stand: Mod assist    General transfer comment: ModA for boost up and initial rise. Pt steady in standing. Able to march in place before becoming fatigued and requesting to lay down    Ambulation/Gait  General Gait Details: deferred due to fatigue        Balance Overall balance assessment: Needs assistance Sitting-balance support: No upper extremity supported, Feet supported Sitting balance-Leahy Scale: Good     Standing balance support: Bilateral upper extremity supported, During functional activity, Reliant on assistive device for balance Standing balance-Leahy Scale: Poor Standing balance comment: reliant on RW        Pertinent Vitals/Pain Pain Assessment Pain Assessment: No/denies pain    Home Living Family/patient expects to be discharged to:: Private residence Living Arrangements: Children Available Help at Discharge: Family;Available 24 hours/day Type of Home: Mobile home Home Access: Ramped entrance       Home Layout: One level Home Equipment: Grab bars - toilet;Grab bars - tub/shower;Tub bench;Rolling Walker (2 wheels);Wheelchair - manual;Hospital bed;Lift chair Additional Comments: has PCA for 4 hours a day, helps with ADLs, cooking "  everything"    Prior Function Prior Level of Function : Needs assist       Physical Assist : Mobility (physical);ADLs  (physical) Mobility (physical): Transfers;Gait;Stairs;Bed mobility ADLs (physical): Grooming;Bathing;Dressing;Toileting;IADLs Mobility Comments: reports always needing asisstance to stand and ambulate, uses RW ADLs Comments: assist for all ADLs, does not cook     Extremity/Trunk Assessment   Upper Extremity Assessment Upper Extremity Assessment: Defer to OT evaluation    Lower Extremity Assessment Lower Extremity Assessment: Generalized weakness    Cervical / Trunk Assessment Cervical / Trunk Assessment: Kyphotic  Communication   Communication Communication: No apparent difficulties  Cognition Arousal: Alert Behavior During Therapy: WFL for tasks assessed/performed Overall Cognitive Status: History of cognitive impairments - at baseline  General Comments: Oriented to self, situation, and that she is in a hospital        General Comments General comments (skin integrity, edema, etc.): VSS on 2L, upon sitting EOB pt with productive cough     PT Assessment Patient needs continued PT services  PT Problem List Decreased strength;Decreased activity tolerance;Decreased balance;Decreased mobility       PT Treatment Interventions DME instruction;Gait training;Functional mobility training;Therapeutic activities;Therapeutic exercise;Balance training;Patient/family education    PT Goals (Current goals can be found in the Care Plan section)  Acute Rehab PT Goals Patient Stated Goal: to go home PT Goal Formulation: With patient/family Time For Goal Achievement: 04/15/23 Potential to Achieve Goals: Good    Frequency Min 1X/week        AM-PAC PT "6 Clicks" Mobility  Outcome Measure Help needed turning from your back to your side while in a flat bed without using bedrails?: A Little Help needed moving from lying on your back to sitting on the side of a flat bed without using bedrails?: A Little Help needed moving to and from a bed to a chair (including a wheelchair)?: A  Lot Help needed standing up from a chair using your arms (e.g., wheelchair or bedside chair)?: A Lot Help needed to walk in hospital room?: A Lot Help needed climbing 3-5 steps with a railing? : A Lot 6 Click Score: 14    End of Session Equipment Utilized During Treatment: Gait belt;Oxygen Activity Tolerance: Patient limited by fatigue Patient left: in bed;with bed alarm set;with call bell/phone within reach;with family/visitor present Nurse Communication: Mobility status PT Visit Diagnosis: Unsteadiness on feet (R26.81);Muscle weakness (generalized) (M62.81)    Time: 1610-9604 PT Time Calculation (min) (ACUTE ONLY): 24 min   Charges:   PT Evaluation $PT Eval Low Complexity: 1 Low   PT General Charges $$ ACUTE PT VISIT: 1 Visit         Hilton Cork, PT, DPT Secure Chat Preferred  Rehab Office [phone]   Arturo Morton Brion Aliment 04/01/2023, 12:48 PM

## 2023-04-01 NOTE — Progress Notes (Signed)
Hospitalist Transfer Note:    Nursing staff, Please call [facility] Admits & Consults System-Wide number on Amion [phone]) as soon as patient's arrival, so appropriate admitting provider can evaluate the pt.   Transferring facility: [facility] Requesting provider: Melina Modena, PA (EDP at [facility]) Reason for transfer: admission for further evaluation and management of acute hypoxic respiratory distress complicated by potential acute asthma exacerbation, in setting of potential viral respiratory infection.    89  year old female with medical history notable for asthma, who presented to [facility] ED complaining of 5 days of progressive cough, shortness of breath, subjective fever, generalized myalgias.  She conveys a history of asthma, but denies any known baseline supplemental oxygen requirements  Vital signs in the ED were notable for the following: Initial oxygen saturation in the mid 80s on room air, Sos[place] improving into the low to mid 90s on 2 L nasal cannula.  Afebrile.  Blood pressure 117/66.  Labs were notable for wbc of 6900.  COVID, influenza, RSV PCR were all negative.  Imaging notable for cardiomegaly with minimal bilateral pleural effusions, but otherwise no evidence of acute cardiopulmonary process.  At [facility], she started on azithromycin and Rocephin for coverage of potential radiographic false negative pneumonia.  She also received dose of IV Decadron, and duo nebulizer treatment  Subsequently, I accepted this patient for transfer for inpatient admission to a med-tele bed at [facility] or [facility] (first available) for further work-up and management of the above.      Yvonne Pigg, DO Hospitalist

## 2023-04-01 NOTE — Plan of Care (Signed)
  Problem: Clinical Measurements: Goal: Ability to maintain clinical measurements within normal limits will improve Outcome: Progressing Goal: Will remain free from infection Outcome: Progressing Goal: Respiratory complications will improve Outcome: Progressing   Problem: Activity: Goal: Risk for activity intolerance will decrease Outcome: Progressing   Problem: Nutrition: Goal: Adequate nutrition will be maintained Outcome: Progressing   Problem: Pain Management: Goal: General experience of comfort will improve Outcome: Progressing   Problem: Safety: Goal: Ability to remain free from injury will improve Outcome: Progressing   Problem: Skin Integrity: Goal: Risk for impaired skin integrity will decrease Outcome: Progressing

## 2023-04-01 NOTE — Plan of Care (Signed)
  Problem: Clinical Measurements: Goal: Ability to maintain clinical measurements within normal limits will improve Outcome: Progressing   Problem: Nutrition: Goal: Adequate nutrition will be maintained Outcome: Progressing   Problem: Coping: Goal: Level of anxiety will decrease Outcome: Progressing   Problem: Elimination: Goal: Will not experience complications related to bowel motility Outcome: Progressing Goal: Will not experience complications related to urinary retention Outcome: Progressing   Problem: Pain Management: Goal: General experience of comfort will improve Outcome: Progressing   Problem: Safety: Goal: Ability to remain free from injury will improve Outcome: Progressing   Problem: Skin Integrity: Goal: Risk for impaired skin integrity will decrease Outcome: Progressing

## 2023-04-02 DIAGNOSIS — J9601 Acute respiratory failure with hypoxia: Secondary | ICD-10-CM | POA: Diagnosis not present

## 2023-04-02 LAB — CBC
HCT: 35.8 % — ABNORMAL LOW (ref 36.0–46.0)
Hemoglobin: 11.2 g/dL — ABNORMAL LOW (ref 12.0–15.0)
MCH: 28.4 pg (ref 26.0–34.0)
MCHC: 31.3 g/dL (ref 30.0–36.0)
MCV: 90.6 fL (ref 80.0–100.0)
Platelets: 221 10*3/uL (ref 150–400)
RBC: 3.95 MIL/uL (ref 3.87–5.11)
RDW: 13.1 % (ref 11.5–15.5)
WBC: 10.5 10*3/uL (ref 4.0–10.5)
nRBC: 0 % (ref 0.0–0.2)

## 2023-04-02 LAB — COMPREHENSIVE METABOLIC PANEL
ALT: 12 U/L (ref 0–44)
AST: 18 U/L (ref 15–41)
Albumin: 2.6 g/dL — ABNORMAL LOW (ref 3.5–5.0)
Alkaline Phosphatase: 59 U/L (ref 38–126)
Anion gap: 12 (ref 5–15)
BUN: 22 mg/dL (ref 8–23)
CO2: 26 mmol/L (ref 22–32)
Calcium: 8.9 mg/dL (ref 8.9–10.3)
Chloride: 101 mmol/L (ref 98–111)
Creatinine, Ser: 1.26 mg/dL — ABNORMAL HIGH (ref 0.44–1.00)
GFR, Estimated: 41 mL/min — ABNORMAL LOW (ref >60)
Glucose, Bld: 113 mg/dL — ABNORMAL HIGH (ref 70–99)
Potassium: 3.7 mmol/L (ref 3.5–5.1)
Sodium: 139 mmol/L (ref 135–145)
Total Bilirubin: 0.5 mg/dL (ref <1.2)
Total Protein: 6.3 g/dL — ABNORMAL LOW (ref 6.5–8.1)

## 2023-04-02 NOTE — Plan of Care (Signed)

## 2023-04-02 NOTE — Progress Notes (Signed)
Occupational Therapy Evaluation Patient Details Name: [name] MRN: [id number] DOB: [birth year] Today's Date: 04/02/2023   History of Present Illness 89 y.o. female presents from [facility] ED to [facility] 04/01/23 w/ generalized body aches x5 days and cough. Workup for acute hypoxic respiratory distress complicated by potential acute asthma exacerbation, in setting of potential viral respiratory infection. PMHx:  Acute on chronic respiratory failure with hypoxia, afib, dementia, DM, HTN, OA, RA, B TKA, R THA   Clinical Impression   PT admitted with acute asthma exacerbation with viral respirator infection. Pt currently with functional limitiations due to the deficits listed below (see OT problem list). Pt at baseline lives at home with family and paid caregivers per chart review. Pt is oriented to location, reason for admission and self. Pt at this time  Pt will benefit from skilled OT to increase their independence and safety with adls and balance to allow discharge skilled inpatient follow up therapy, <3 hours/day. Pt might be able to progress further to home services if family has support needed..         If plan is discharge home, recommend the following: A little help with walking and/or transfers;A lot of help with bathing/dressing/bathroom    Functional Status Assessment  Patient has had a recent decline in their functional status and demonstrates the ability to make significant improvements in function in a reasonable and predictable amount of time.  Equipment Recommendations  Other (comment) (TBA based on family needs- w/c for pushing lower distances, has RW already , [facility])    Recommendations for Other Services       Precautions / Restrictions Precautions Precautions: Fall Restrictions Weight Bearing Restrictions: No      Mobility Bed Mobility Overal bed mobility: Needs Assistance Bed Mobility: Supine to Sit, Sit to Supine     Supine to sit: Min assist, HOB elevated, Used  rails Sit to supine: Min assist, HOB elevated, Used rails   General bed mobility comments: HHA and progressed to eob    Transfers Overall transfer level: Needs assistance Equipment used: 1 person hand held assist Transfers: Sit to/from Stand Sit to Stand: Min assist           General transfer comment: able to sit step toward [facility]      Balance Overall balance assessment: Needs assistance Sitting-balance support: No upper extremity supported, Feet supported Sitting balance-Leahy Scale: Good     Standing balance support: During functional activity, Reliant on assistive device for balance Standing balance-Leahy Scale: Poor Standing balance comment: reliant on RW                           ADL either performed or assessed with clinical judgement   ADL Overall ADL's : Needs assistance/impaired Eating/Feeding: Set up   Grooming: Set up;Bed level Grooming Details (indicate cue type and reason): washed faced Upper Body Bathing: Minimal assistance   Lower Body Bathing: Maximal assistance   Upper Body Dressing : Minimal assistance   Lower Body Dressing: Maximal assistance                       Vision Baseline Vision/History: 0 No visual deficits Ability to See in Adequate Light: 0 Adequate Patient Visual Report: No change from baseline       Perception         Praxis         Pertinent Vitals/Pain Pain Assessment Pain Assessment: Faces Faces Pain Scale:  Hurts a little bit Pain Location: generalized Pain Descriptors / Indicators: Discomfort Pain Intervention(s): Monitored during session, Premedicated before session, Repositioned, Limited activity within patient's tolerance     Extremity/Trunk Assessment Upper Extremity Assessment Upper Extremity Assessment: Overall WFL for tasks assessed   Lower Extremity Assessment Lower Extremity Assessment: Defer to PT evaluation   Cervical / Trunk Assessment Cervical / Trunk Assessment: Kyphotic    Communication Communication Communication: No apparent difficulties   Cognition Arousal: Alert Behavior During Therapy: WFL for tasks assessed/performed Overall Cognitive Status: History of cognitive impairments - at baseline                                 General Comments: Oriented to self, situation, and that she is in a hospital     General Comments  VSS    Exercises     Shoulder Instructions      Home Living Family/patient expects to be discharged to:: Private residence Living Arrangements: Children Available Help at Discharge: Family;Available 24 hours/day Type of Home: Mobile home Home Access: Ramped entrance     Home Layout: One level     Bathroom Shower/Tub: Chief Strategy Officer: Handicapped height Bathroom Accessibility: Yes   Home Equipment: Grab bars - toilet;Grab bars - tub/shower;Tub bench;Rolling Walker (2 wheels);Wheelchair - manual;Hospital bed;Lift chair   Additional Comments: has PCA for 4 hours a day, helps with ADLs, cooking "everything"      Prior Functioning/Environment Prior Level of Function : Needs assist       Physical Assist : Mobility (physical);ADLs (physical) Mobility (physical): Transfers;Gait;Stairs;Bed mobility ADLs (physical): Grooming;Bathing;Dressing;Toileting;IADLs Mobility Comments: reports always needing asisstance to stand and ambulate, uses RW ADLs Comments: assist for all ADLs, does not cook        OT Problem List: Impaired balance (sitting and/or standing);Decreased activity tolerance;Decreased safety awareness;Decreased cognition;Decreased knowledge of use of DME or AE;Decreased knowledge of precautions;Obesity      OT Treatment/Interventions: Self-care/ADL training;Energy conservation;DME and/or AE instruction;Therapeutic activities;Patient/family education;Balance training    OT Goals(Current goals can be found in the care plan section) Acute Rehab OT Goals Patient Stated Goal: to  get warmer-- pt reports increased warmth with door closed and movement OT Goal Formulation: Patient unable to participate in goal setting Time For Goal Achievement: 04/16/23 Potential to Achieve Goals: Good  OT Frequency: Min 1X/week    Co-evaluation              AM-PAC OT "6 Clicks" Daily Activity     Outcome Measure Help from another person eating meals?: A Little Help from another person taking care of personal grooming?: A Little Help from another person toileting, which includes using toliet, bedpan, or urinal?: A Lot Help from another person bathing (including washing, rinsing, drying)?: A Lot Help from another person to put on and taking off regular upper body clothing?: A Little Help from another person to put on and taking off regular lower body clothing?: A Lot 6 Click Score: 15   End of Session Nurse Communication: Mobility status;Precautions  Activity Tolerance: Patient tolerated treatment well Patient left: in bed;with call bell/phone within reach;with bed alarm set  OT Visit Diagnosis: Unsteadiness on feet (R26.81);Muscle weakness (generalized) (M62.81)                Time: 1520-1540 OT Time Calculation (min): 20 min Charges:  OT General Charges $OT Visit: 1 Visit OT Evaluation $OT Eval Moderate Complexity: 1 Mod  Timmothy Euler, OTR/L  Acute Rehabilitation Services Office: [phone] .   Mateo Flow 04/02/2023, 4:03 PM

## 2023-04-02 NOTE — Plan of Care (Signed)
  Problem: Clinical Measurements: Goal: Ability to maintain clinical measurements within normal limits will improve Outcome: Progressing Goal: Will remain free from infection Outcome: Progressing Goal: Respiratory complications will improve Outcome: Progressing   Problem: Activity: Goal: Risk for activity intolerance will decrease Outcome: Progressing   Problem: Nutrition: Goal: Adequate nutrition will be maintained Outcome: Progressing   Problem: Pain Management: Goal: General experience of comfort will improve Outcome: Progressing   Problem: Safety: Goal: Ability to remain free from injury will improve Outcome: Progressing   Problem: Skin Integrity: Goal: Risk for impaired skin integrity will decrease Outcome: Progressing

## 2023-04-02 NOTE — Progress Notes (Signed)
PROGRESS NOTE    [name]  ZOX:[id number] DOB: March 24, 1934 DOA: 03/31/2023 PCP: Donita Brooks, MD    Brief Narrative:  [name] is a 89 y.o. adult here with cough/congestion and body aches for 5 days.  NP swab negative.  Will treat symptomatically for now and monitor for improvement.  Check BNP and stop IVF for now.    Assessment and Plan: Acute hypoxemic respiratory failure secondary to URI (viral versus early bacterial) -weaned to RA -Empiric IV Rocephin and Zithromax for possible early CAP -PT Eval   Acute altered mental status -resolved   History of asthma Currently not wheezing Albuterol as needed Solu-Medrol 40 mg IV daily- change to PO upon d/c -flutter valve    CKD 3A -trend   Chronic atrial fibrillation Currently rate controlled Not on anticoagulation prior to admission   Dyslipidemia -statin   Hypertension/HFpEF -BP improving   Chronic back pain/neuropathy Continue gabapentin and Cymbalta   CAD -asymptomatic     DVT prophylaxis: enoxaparin (LOVENOX) injection 40 mg Start: 04/01/23 0930    Code Status: Full Code  Disposition Plan:  Level of care: Telemetry Status is: Inpatient Remains inpatient appropriate    Consultants:  none   Subjective: No SOB, no CP-- feeling better  Objective: Vitals:   04/01/23 1600 04/01/23 2014 04/02/23 0459 04/02/23 0754  BP: 115/73 109/72 115/66 122/87  Pulse: 79 87 91 82  Resp:  18 20 18   Temp: 97.8 F (36.6 C) 98.3 F (36.8 C) 98.2 F (36.8 C) 98.9 F (37.2 C)  TempSrc: Oral Oral Oral Oral  SpO2: 94% 93% 95% 95%  Weight:      Height:        Intake/Output Summary (Last 24 hours) at 04/02/2023 1206 Last data filed at 04/01/2023 2116 Gross per 24 hour  Intake 300 ml  Output --  Net 300 ml   Filed Weights   03/31/23 2118 04/01/23 0200  Weight: 72.8 kg 75.6 kg    Examination:   General: Appearance:    Obese adult in no acute distress     Lungs:     respirations  unlabored  Heart:    Normal heart rate. .    MS:   All extremities are intact.    Neurologic:   Awake, alert       Data Reviewed: I have personally reviewed following labs and imaging studies  CBC: Recent Labs  Lab 03/31/23 2123 04/01/23 0847 04/02/23 0717  WBC 6.9 6.6 10.5  NEUTROABS 4.0  --   --   HGB 12.2 11.8* 11.2*  HCT 38.3 37.3 35.8*  MCV 90.3 89.4 90.6  PLT 228 216 221   Basic Metabolic Panel: Recent Labs  Lab 03/31/23 2123 04/01/23 0847 04/02/23 0717  NA 136  --  139  K 3.8  --  3.7  CL 97*  --  101  CO2 31  --  26  GLUCOSE 147*  --  113*  BUN 16  --  22  CREATININE 1.00 1.07* 1.26*  CALCIUM 9.0  --  8.9   GFR: Estimated Creatinine Clearance (by C-G formula based on SCr of 1.26 mg/dL (H)) Female: 81.1 mL/min (A) Female: 34.4 mL/min (A) Liver Function Tests: Recent Labs  Lab 04/02/23 0717  AST 18  ALT 12  ALKPHOS 59  BILITOT 0.5  PROT 6.3*  ALBUMIN 2.6*   No results for input(s): "LIPASE", "AMYLASE" in the last 168 hours. No results for input(s): "AMMONIA" in the last 168 hours.  Coagulation Profile: No results for input(s): "INR", "PROTIME" in the last 168 hours. Cardiac Enzymes: Recent Labs  Lab 04/01/23 0935  CKTOTAL 34*   BNP (last 3 results) No results for input(s): "PROBNP" in the last 8760 hours. HbA1C: No results for input(s): "HGBA1C" in the last 72 hours. CBG: No results for input(s): "GLUCAP" in the last 168 hours. Lipid Profile: No results for input(s): "CHOL", "HDL", "LDLCALC", "TRIG", "CHOLHDL", "LDLDIRECT" in the last 72 hours. Thyroid Function Tests: No results for input(s): "TSH", "T4TOTAL", "FREET4", "T3FREE", "THYROIDAB" in the last 72 hours. Anemia Panel: No results for input(s): "VITAMINB12", "FOLATE", "FERRITIN", "TIBC", "IRON", "RETICCTPCT" in the last 72 hours. Sepsis Labs: Recent Labs  Lab 04/01/23 0935 04/01/23 1131  PROCALCITON <0.10  --   LATICACIDVEN 2.3* 1.9    Recent Results (from the past 240  hour(s))  Resp panel by RT-PCR (RSV, Flu A&B, Covid) Anterior Nasal Swab     Status: None   Collection Time: 03/31/23  9:23 PM   Specimen: Anterior Nasal Swab  Result Value Ref Range Status   SARS Coronavirus 2 by RT PCR NEGATIVE NEGATIVE Final    Comment: (NOTE) SARS-CoV-2 target nucleic acids are NOT DETECTED.  The SARS-CoV-2 RNA is generally detectable in upper respiratory specimens during the acute phase of infection. The lowest concentration of SARS-CoV-2 viral copies this assay can detect is 138 copies/mL. A negative result does not preclude SARS-Cov-2 infection and should not be used as the sole basis for treatment or other patient management decisions. A negative result may occur with  improper specimen collection/handling, submission of specimen other than nasopharyngeal swab, presence of viral mutation(s) within the areas targeted by this assay, and inadequate number of viral copies(<138 copies/mL). A negative result must be combined with clinical observations, patient history, and epidemiological information. The expected result is Negative.  Fact Sheet for Patients:  BloggerCourse.com  Fact Sheet for Healthcare Providers:  SeriousBroker.it  This test is no t yet approved or cleared by the [place] FDA and  has been authorized for detection and/or diagnosis of SARS-CoV-2 by FDA under an Emergency Use Authorization (EUA). This EUA will remain  in effect (meaning this test can be used) for the duration of the COVID-19 declaration under Section 564(b)(1) of the Act, 21 U.S.C.section 360bbb-3(b)(1), unless the authorization is terminated  or revoked sooner.       Influenza A by PCR NEGATIVE NEGATIVE Final   Influenza B by PCR NEGATIVE NEGATIVE Final    Comment: (NOTE) The Xpert Xpress SARS-CoV-2/FLU/RSV plus assay is intended as an aid in the diagnosis of influenza from Nasopharyngeal swab specimens and should not  be used as a sole basis for treatment. Nasal washings and aspirates are unacceptable for Xpert Xpress SARS-CoV-2/FLU/RSV testing.  Fact Sheet for Patients: BloggerCourse.com  Fact Sheet for Healthcare Providers: SeriousBroker.it  This test is not yet approved or cleared by the [place] FDA and has been authorized for detection and/or diagnosis of SARS-CoV-2 by FDA under an Emergency Use Authorization (EUA). This EUA will remain in effect (meaning this test can be used) for the duration of the COVID-19 declaration under Section 564(b)(1) of the Act, 21 U.S.C. section 360bbb-3(b)(1), unless the authorization is terminated or revoked.     Resp Syncytial Virus by PCR NEGATIVE NEGATIVE Final    Comment: (NOTE) Fact Sheet for Patients: BloggerCourse.com  Fact Sheet for Healthcare Providers: SeriousBroker.it  This test is not yet approved or cleared by the [place] and has been authorized for  detection and/or diagnosis of SARS-CoV-2 by FDA under an Emergency Use Authorization (EUA). This EUA will remain in effect (meaning this test can be used) for the duration of the COVID-19 declaration under Section 564(b)(1) of the Act, 21 U.S.C. section 360bbb-3(b)(1), unless the authorization is terminated or revoked.  Performed at [facility], [address] [place], [place] [address]   Respiratory (~20 pathogens) panel by PCR     Status: None   Collection Time: 04/01/23  6:47 AM   Specimen: Nasopharyngeal Swab; Respiratory  Result Value Ref Range Status   Adenovirus NOT DETECTED NOT DETECTED Final   Coronavirus 229E NOT DETECTED NOT DETECTED Final    Comment: (NOTE) The Coronavirus on the Respiratory Panel, DOES NOT test for the novel  Coronavirus (2019 nCoV)    Coronavirus HKU1 NOT DETECTED NOT DETECTED Final   Coronavirus NL63 NOT DETECTED NOT  DETECTED Final   Coronavirus OC43 NOT DETECTED NOT DETECTED Final   Metapneumovirus NOT DETECTED NOT DETECTED Final   Rhinovirus / Enterovirus NOT DETECTED NOT DETECTED Final   Influenza A NOT DETECTED NOT DETECTED Final   Influenza B NOT DETECTED NOT DETECTED Final   Parainfluenza Virus 1 NOT DETECTED NOT DETECTED Final   Parainfluenza Virus 2 NOT DETECTED NOT DETECTED Final   Parainfluenza Virus 3 NOT DETECTED NOT DETECTED Final   Parainfluenza Virus 4 NOT DETECTED NOT DETECTED Final   Respiratory Syncytial Virus NOT DETECTED NOT DETECTED Final   Bordetella pertussis NOT DETECTED NOT DETECTED Final   Bordetella Parapertussis NOT DETECTED NOT DETECTED Final   Chlamydophila pneumoniae NOT DETECTED NOT DETECTED Final   Mycoplasma pneumoniae NOT DETECTED NOT DETECTED Final    Comment: Performed at [facility], [address], [place], [place] [address]         Radiology Studies: DG Chest Portable 1 View  Result Date: 03/31/2023 CLINICAL DATA:  Coughing and body aches for 1 week. EXAM: PORTABLE CHEST 1 VIEW COMPARISON:  PA and lateral chest 10/14/2021 FINDINGS: The heart is moderately enlarged but unchanged. No vascular congestion is seen. There are bilateral minimal pleural effusions. The lungs are otherwise clear. There is a stable mediastinum with tortuous and calcified aorta. There is osteopenia with degenerative changes of the spine and shoulders and right shoulder os acromiale. No new osseous abnormality. IMPRESSION: 1. Cardiomegaly with minimal bilateral pleural effusions. No overt CHF findings. 2. No other evidence of acute chest disease. 3. Aortic atherosclerosis. Electronically Signed   By: Almira Bar M.D.   On: 03/31/2023 22:55        Scheduled Meds:  aspirin EC  81 mg Oral Daily   atenolol  12.5 mg Oral Daily   DULoxetine  60 mg Oral Daily   enoxaparin (LOVENOX) injection  40 mg Subcutaneous Q24H   ezetimibe  10 mg Oral Daily   methylPREDNISolone  (SOLU-MEDROL) injection  40 mg Intravenous Q24H   rosuvastatin  40 mg Oral QPM   sodium chloride flush  3 mL Intravenous Q12H   Continuous Infusions:  azithromycin (ZITHROMAX) 500 mg in sodium chloride 0.9 % 250 mL IVPB 500 mg (04/01/23 2233)   cefTRIAXone (ROCEPHIN)  IV 1 g (04/01/23 2116)     LOS: 1 day    Time spent: 45  min minutes spent on chart review, discussion with nursing staff, consultants, updating family and interview/physical exam; more than 50% of that time was spent in counseling and/or coordination of care.    Joseph Art, DO [facility] Available via  Epic secure chat 7am-7pm After these hours, please refer to coverage provider listed on [email or web address] 04/02/2023, 12:06 PM

## 2023-04-03 DIAGNOSIS — J9601 Acute respiratory failure with hypoxia: Secondary | ICD-10-CM | POA: Diagnosis not present

## 2023-04-03 LAB — BASIC METABOLIC PANEL
Anion gap: 12 (ref 5–15)
BUN: 23 mg/dL (ref 8–23)
CO2: 24 mmol/L (ref 22–32)
Calcium: 8.9 mg/dL (ref 8.9–10.3)
Chloride: 103 mmol/L (ref 98–111)
Creatinine, Ser: 0.91 mg/dL (ref 0.44–1.00)
GFR, Estimated: >60 mL/min (ref >60)
Glucose, Bld: 98 mg/dL (ref 70–99)
Potassium: 4.1 mmol/L (ref 3.5–5.1)
Sodium: 139 mmol/L (ref 135–145)

## 2023-04-03 LAB — CBC
HCT: 37.9 % (ref 36.0–46.0)
Hemoglobin: 12 g/dL (ref 12.0–15.0)
MCH: 28.5 pg (ref 26.0–34.0)
MCHC: 31.7 g/dL (ref 30.0–36.0)
MCV: 90 fL (ref 80.0–100.0)
Platelets: 228 10*3/uL (ref 150–400)
RBC: 4.21 MIL/uL (ref 3.87–5.11)
RDW: 13.2 % (ref 11.5–15.5)
WBC: 10.9 10*3/uL — ABNORMAL HIGH (ref 4.0–10.5)
nRBC: 0 % (ref 0.0–0.2)

## 2023-04-03 MED ORDER — DOXYCYCLINE HYCLATE 100 MG PO TABS: 100.0000 mg | ORAL_TABLET | Freq: Two times a day (BID) | ORAL | 0 refills | Status: DC

## 2023-04-03 MED ORDER — PREDNISONE 20 MG PO TABS: 40.0000 mg | ORAL_TABLET | Freq: Every day | ORAL | 0 refills | Status: DC

## 2023-04-03 MED ORDER — NYSTATIN 100000 UNIT/ML MT SUSP: 5.0000 mL | Freq: Four times a day (QID) | OROMUCOSAL | Status: DC

## 2023-04-03 MED ORDER — ASPIRIN EC 81 MG PO TBEC: 81.0000 mg | DELAYED_RELEASE_TABLET | Freq: Every day | ORAL | Status: AC

## 2023-04-03 MED ORDER — NYSTATIN 100000 UNIT/ML MT SUSP: 5.0000 mL | Freq: Four times a day (QID) | OROMUCOSAL | 0 refills | Status: DC

## 2023-04-03 NOTE — Discharge Summary (Signed)
Physician Discharge Summary  [name] OAC:[id number] DOB: [birth year] DOA: 03/31/2023  PCP: Donita Brooks, MD  Admit date: 03/31/2023 Discharge date: 04/03/2023  Admitted From: home Discharge disposition: home   Recommendations for Outpatient Follow-Up:   Refused home health  Discharge Diagnosis:   Principal Problem:   Acute hypoxic respiratory failure [facility])    Discharge Condition: Improved.  Diet recommendation: Low sodium, heart healthy.  Carbohydrate-modified.  Regular.  Wound care: None.  Code status: Full.   History of Present Illness:   [name] is a 89 y.o. adult with medical history significant of dyslipidemia, neuropathy and chronic back pain, hypertension, asthma, sleep apnea, atrial fibrillation not on anticoagulation, HFpEF, CAD and CKD 3.  She presented to the ED with reports of generalized bodyaches excessive sleeping, chills, subjective fevers congestion and cough for 5 days.  No nausea or vomiting.  In the ED she was afebrile, no leukocytosis, drowsy, hemodynamically stable and maintaining atrial fibrillation with ventricular rates in the 60s to 80s, she was hypoxemic with room air sats in the mid 80s and required oxygen at 2 L/min.  Labs were unremarkable and she did not have any leukocytosis.  Respiratory PCR was negative for flu, RSV or COVID.  Chest x-ray without any signs of acute infection and just minimal bilateral pleural effusions.  Hospitalist has been asked to evaluate the patient for admission.   [facility] at [facility].  Upon my evaluation of the patient she was drowsy but would awaken and answer questions appropriately.  She was complaining of feeling very tired and was not hungry.  Currently not coughing or having any difficulty breathing or increased work of breathing.    Hospital Course by Problem:   Acute hypoxemic respiratory failure secondary to URI (viral  versus early bacterial) -weaned to RA -Empiric IV Rocephin and Zithromax for possible early CAP- change to PO abx -improved   Acute altered mental status -resolved   History of asthma Currently not wheezing Albuterol as needed Solu-Medrol 40 mg IV daily- change to PO upon d/c -flutter valve     CKD 3A -trend   Chronic atrial fibrillation Currently rate controlled Not on anticoagulation prior to admission   Dyslipidemia -statin   Hypertension/HFpEF -BP improving   Chronic back pain/neuropathy Continue gabapentin and Cymbalta   CAD -asymptomatic    Medical Consultants:      Discharge Exam:   Vitals:   04/03/23 0500 04/03/23 0738  BP: 125/88 (!) 131/93  Pulse: (!) 108 97  Resp: 18 16  Temp: 98.6 F (37 C) 98.5 F (36.9 C)  SpO2: 94% 96%   Vitals:   04/02/23 1507 04/02/23 2122 04/03/23 0500 04/03/23 0738  BP: (!) 152/101 (!) 148/85 125/88 (!) 131/93  Pulse: 72 85 (!) 108 97  Resp: 18 18 18 16   Temp: 98.1 F (36.7 C) 98 F (36.7 C) 98.6 F (37 C) 98.5 F (36.9 C)  TempSrc: Oral Oral Oral Oral  SpO2: 95%  94% 96%  Weight:      Height:        General exam: Appears calm and comfortable.    The results of significant diagnostics from this hospitalization (including imaging, microbiology, ancillary and laboratory) are listed below for reference.     Procedures and Diagnostic Studies:   DG Chest Portable 1 View  Result Date: 03/31/2023 CLINICAL DATA:  Coughing and body aches for 1 week. EXAM: PORTABLE  CHEST 1 VIEW COMPARISON:  PA and lateral chest 10/14/2021 FINDINGS: The heart is moderately enlarged but unchanged. No vascular congestion is seen. There are bilateral minimal pleural effusions. The lungs are otherwise clear. There is a stable mediastinum with tortuous and calcified aorta. There is osteopenia with degenerative changes of the spine and shoulders and right shoulder os acromiale. No new osseous abnormality. IMPRESSION: 1. Cardiomegaly  with minimal bilateral pleural effusions. No overt CHF findings. 2. No other evidence of acute chest disease. 3. Aortic atherosclerosis. Electronically Signed   By: Almira Bar M.D.   On: 03/31/2023 22:55     Labs:   Basic Metabolic Panel: Recent Labs  Lab 03/31/23 2123 04/01/23 0847 04/02/23 0717 04/03/23 0729  NA 136  --  139 139  K 3.8  --  3.7 4.1  CL 97*  --  101 103  CO2 31  --  26 24  GLUCOSE 147*  --  113* 98  BUN 16  --  22 23  CREATININE 1.00 1.07* 1.26* 0.91  CALCIUM 9.0  --  8.9 8.9   GFR Estimated Creatinine Clearance (by C-G formula based on SCr of 0.91 mg/dL) Female: 16.1 mL/min Female: 47.6 mL/min Liver Function Tests: Recent Labs  Lab 04/02/23 0717  AST 18  ALT 12  ALKPHOS 59  BILITOT 0.5  PROT 6.3*  ALBUMIN 2.6*   No results for input(s): "LIPASE", "AMYLASE" in the last 168 hours. No results for input(s): "AMMONIA" in the last 168 hours. Coagulation profile No results for input(s): "INR", "PROTIME" in the last 168 hours.  CBC: Recent Labs  Lab 03/31/23 2123 04/01/23 0847 04/02/23 0717 04/03/23 0729  WBC 6.9 6.6 10.5 10.9*  NEUTROABS 4.0  --   --   --   HGB 12.2 11.8* 11.2* 12.0  HCT 38.3 37.3 35.8* 37.9  MCV 90.3 89.4 90.6 90.0  PLT 228 216 221 228   Cardiac Enzymes: Recent Labs  Lab 04/01/23 0935  CKTOTAL 34*   BNP: Invalid input(s): "POCBNP" CBG: No results for input(s): "GLUCAP" in the last 168 hours. D-Dimer No results for input(s): "DDIMER" in the last 72 hours. Hgb A1c No results for input(s): "HGBA1C" in the last 72 hours. Lipid Profile No results for input(s): "CHOL", "HDL", "LDLCALC", "TRIG", "CHOLHDL", "LDLDIRECT" in the last 72 hours. Thyroid function studies No results for input(s): "TSH", "T4TOTAL", "T3FREE", "THYROIDAB" in the last 72 hours.  Invalid input(s): "FREET3" Anemia work up No results for input(s): "VITAMINB12", "FOLATE", "FERRITIN", "TIBC", "IRON", "RETICCTPCT" in the last 72  hours. Microbiology Recent Results (from the past 240 hour(s))  Resp panel by RT-PCR (RSV, Flu A&B, Covid) Anterior Nasal Swab     Status: None   Collection Time: 03/31/23  9:23 PM   Specimen: Anterior Nasal Swab  Result Value Ref Range Status   SARS Coronavirus 2 by RT PCR NEGATIVE NEGATIVE Final    Comment: (NOTE) SARS-CoV-2 target nucleic acids are NOT DETECTED.  The SARS-CoV-2 RNA is generally detectable in upper respiratory specimens during the acute phase of infection. The lowest concentration of SARS-CoV-2 viral copies this assay can detect is 138 copies/mL. A negative result does not preclude SARS-Cov-2 infection and should not be used as the sole basis for treatment or other patient management decisions. A negative result may occur with  improper specimen collection/handling, submission of specimen other than nasopharyngeal swab, presence of viral mutation(s) within the areas targeted by this assay, and inadequate number of viral copies(<138 copies/mL). A negative result must be combined  with clinical observations, patient history, and epidemiological information. The expected result is Negative.  Fact Sheet for Patients:  BloggerCourse.com  Fact Sheet for Healthcare Providers:  SeriousBroker.it  This test is no t yet approved or cleared by the [place] FDA and  has been authorized for detection and/or diagnosis of SARS-CoV-2 by FDA under an Emergency Use Authorization (EUA). This EUA will remain  in effect (meaning this test can be used) for the duration of the COVID-19 declaration under Section 564(b)(1) of the Act, 21 U.S.C.section 360bbb-3(b)(1), unless the authorization is terminated  or revoked sooner.       Influenza A by PCR NEGATIVE NEGATIVE Final   Influenza B by PCR NEGATIVE NEGATIVE Final    Comment: (NOTE) The Xpert Xpress SARS-CoV-2/FLU/RSV plus assay is intended as an aid in the diagnosis of  influenza from Nasopharyngeal swab specimens and should not be used as a sole basis for treatment. Nasal washings and aspirates are unacceptable for Xpert Xpress SARS-CoV-2/FLU/RSV testing.  Fact Sheet for Patients: BloggerCourse.com  Fact Sheet for Healthcare Providers: SeriousBroker.it  This test is not yet approved or cleared by the [place] FDA and has been authorized for detection and/or diagnosis of SARS-CoV-2 by FDA under an Emergency Use Authorization (EUA). This EUA will remain in effect (meaning this test can be used) for the duration of the COVID-19 declaration under Section 564(b)(1) of the Act, 21 U.S.C. section 360bbb-3(b)(1), unless the authorization is terminated or revoked.     Resp Syncytial Virus by PCR NEGATIVE NEGATIVE Final    Comment: (NOTE) Fact Sheet for Patients: BloggerCourse.com  Fact Sheet for Healthcare Providers: SeriousBroker.it  This test is not yet approved or cleared by the [place] FDA and has been authorized for detection and/or diagnosis of SARS-CoV-2 by FDA under an Emergency Use Authorization (EUA). This EUA will remain in effect (meaning this test can be used) for the duration of the COVID-19 declaration under Section 564(b)(1) of the Act, 21 U.S.C. section 360bbb-3(b)(1), unless the authorization is terminated or revoked.  Performed at [facility], [address] [place], [place] [address]   Respiratory (~20 pathogens) panel by PCR     Status: None   Collection Time: 04/01/23  6:47 AM   Specimen: Nasopharyngeal Swab; Respiratory  Result Value Ref Range Status   Adenovirus NOT DETECTED NOT DETECTED Final   Coronavirus 229E NOT DETECTED NOT DETECTED Final    Comment: (NOTE) The Coronavirus on the Respiratory Panel, DOES NOT test for the novel  Coronavirus (2019 nCoV)    Coronavirus HKU1 NOT DETECTED  NOT DETECTED Final   Coronavirus NL63 NOT DETECTED NOT DETECTED Final   Coronavirus OC43 NOT DETECTED NOT DETECTED Final   Metapneumovirus NOT DETECTED NOT DETECTED Final   Rhinovirus / Enterovirus NOT DETECTED NOT DETECTED Final   Influenza A NOT DETECTED NOT DETECTED Final   Influenza B NOT DETECTED NOT DETECTED Final   Parainfluenza Virus 1 NOT DETECTED NOT DETECTED Final   Parainfluenza Virus 2 NOT DETECTED NOT DETECTED Final   Parainfluenza Virus 3 NOT DETECTED NOT DETECTED Final   Parainfluenza Virus 4 NOT DETECTED NOT DETECTED Final   Respiratory Syncytial Virus NOT DETECTED NOT DETECTED Final   Bordetella pertussis NOT DETECTED NOT DETECTED Final   Bordetella Parapertussis NOT DETECTED NOT DETECTED Final   Chlamydophila pneumoniae NOT DETECTED NOT DETECTED Final   Mycoplasma pneumoniae NOT DETECTED NOT DETECTED Final    Comment: Performed at [facility], [address], [place], [place]  [address]     Discharge Instructions:   Discharge Instructions     Diet - low sodium heart healthy   Complete by: As directed    Discharge instructions   Complete by: As directed    Home health Continue flutter valve   Increase activity slowly   Complete by: As directed       Allergies as of 04/03/2023       Reactions   Sulfonamide Derivatives Hives, Rash        Medication List     TAKE these medications    albuterol 108 (90 Base) MCG/ACT inhaler Commonly known as: VENTOLIN HFA Inhale 2 puffs into the lungs every 4 (four) hours as needed for wheezing or shortness of breath.   aspirin EC 81 MG tablet Take 1 tablet (81 mg total) by mouth daily. What changed:  how much to take how to take this when to take this additional instructions   atenolol 25 MG tablet Commonly known as: TENORMIN TAKE 1/2 TABLET BY MOUTH DAILY   Calcium Carbonate-Vitamin D3 600-400 MG-UNIT Tabs Take 1 tablet by mouth daily.   clotrimazole-betamethasone cream Commonly known as:  LOTRISONE Apply 1 Application topically daily as needed (fungal infection).   CORICIDIN HBP PO Take 1 tablet by mouth every 6 (six) hours as needed (cold symptoms).   doxycycline 100 MG tablet Commonly known as: VIBRA-TABS Take 1 tablet (100 mg total) by mouth 2 (two) times daily.   DULoxetine 30 MG capsule Commonly known as: CYMBALTA Take 1 capsule (30 mg total) by mouth daily. What changed: additional instructions   DULoxetine 60 MG capsule Commonly known as: CYMBALTA Take 1 capsule (60 mg total) by mouth daily. Take in addition to the Cymbalta 30 mg to equal a total dose of 90 mg per day. What changed: Another medication with the same name was changed. Make sure you understand how and when to take each.   ezetimibe 10 MG tablet Commonly known as: ZETIA TAKE 1 TABLET (10 MG TOTAL) BY MOUTH DAILY.   gabapentin 800 MG tablet Commonly known as: NEURONTIN TAKE 1 TABLET BY MOUTH 3 TIMES DAILY   pantoprazole 40 MG tablet Commonly known as: PROTONIX TAKE 1 TABLET BY MOUTH ONCE DAILY   predniSONE 20 MG tablet Commonly known as: DELTASONE Take 2 tablets (40 mg total) by mouth daily.   rosuvastatin 40 MG tablet Commonly known as: CRESTOR TAKE 1 TABLET BY MOUTH IN THE EVENING          Time coordinating discharge: 45 min  Signed:  Joseph Art DO  [facility] 04/03/2023, 9:09 AM

## 2023-04-03 NOTE — Progress Notes (Signed)
Occupational Therapy Treatment Patient Details Name: [name] MRN: [id number] DOB: [birth year] Today's Date: 04/03/2023   History of present illness 89 y.o. female presents from [facility] ED to [facility] 04/01/23 w/ generalized body aches x5 days and cough. Workup for acute hypoxic respiratory distress complicated by potential acute asthma exacerbation, in setting of potential viral respiratory infection. PMHx:  Acute on chronic respiratory failure with hypoxia, afib, dementia, DM, HTN, OA, RA, B TKA, R THA   OT comments  Patient participated with OT treatment following encouragement to address bed mobility and transfers before transfer home with family. Patient able to get to EOB with min assist, needing assistance to scoot hips forward with use of bed pad. Patient ambulated to bathroom for toilet transfer with min assist. Once on toilet patient began complaints of nausea and asking to return to bed. Patient ambulated back to EOB and was min assist to return to supine. BP seated on EOB after ambulating from bathroom 169/95 (116) in supine 156/103 (120). Discharge recommendations continue to be appropriate.       If plan is discharge home, recommend the following:  A little help with walking and/or transfers;A lot of help with bathing/dressing/bathroom   Equipment Recommendations  None recommended by OT    Recommendations for Other Services      Precautions / Restrictions Precautions Precautions: Fall Restrictions Weight Bearing Restrictions: No       Mobility Bed Mobility Overal bed mobility: Needs Assistance Bed Mobility: Supine to Sit, Sit to Supine     Supine to sit: Min assist, HOB elevated, Used rails Sit to supine: Min assist, HOB elevated, Used rails   General bed mobility comments: use of bed pad to scoot towards EOB    Transfers Overall transfer level: Needs assistance Equipment used: Rolling walker (2 wheels) Transfers: Sit to/from Stand, Bed to  chair/wheelchair/BSC Sit to Stand: Min assist     Step pivot transfers: Min assist     General transfer comment: cues for hand placement and safety     Balance Overall balance assessment: Needs assistance Sitting-balance support: No upper extremity supported, Feet supported Sitting balance-Leahy Scale: Good     Standing balance support: During functional activity, Reliant on assistive device for balance Standing balance-Leahy Scale: Poor Standing balance comment: reliant on RW                           ADL either performed or assessed with clinical judgement   ADL Overall ADL's : Needs assistance/impaired     Grooming: Wash/dry hands;Wash/dry face;Set up;Sitting                   Toilet Transfer: Minimal assistance;Ambulation;Regular Toilet;Rolling walker (2 wheels)                  Extremity/Trunk Assessment              Vision       Perception     Praxis      Cognition Arousal: Alert Behavior During Therapy: WFL for tasks assessed/performed Overall Cognitive Status: History of cognitive impairments - at baseline                                 General Comments: required encouragement to participate, complaints of nausea once up and asking to return to bed        Exercises  Shoulder Instructions       General Comments BP seated on EOB after ambulating from bathroom 169/95 (116) in supine 156/103 (120)    Pertinent Vitals/ Pain       Pain Assessment Pain Assessment: Faces Faces Pain Scale: Hurts a little bit Pain Location: generalized Pain Descriptors / Indicators: Discomfort Pain Intervention(s): Monitored during session, Repositioned  Home Living                                          Prior Functioning/Environment              Frequency  Min 1X/week        Progress Toward Goals  OT Goals(current goals can now be found in the care plan section)  Progress towards  OT goals: Progressing toward goals  Acute Rehab OT Goals Patient Stated Goal: go home OT Goal Formulation: With patient Time For Goal Achievement: 04/16/23 Potential to Achieve Goals: Good ADL Goals Pt Will Perform Grooming: with modified independence;sitting Pt Will Perform Upper Body Bathing: with set-up;sitting Pt Will Transfer to Toilet: with min assist;ambulating;regular height toilet Additional ADL Goal #1: pt will complete bed mobility superivsion level as precursor to adls.  Plan      Co-evaluation                 AM-PAC OT "6 Clicks" Daily Activity     Outcome Measure   Help from another person eating meals?: A Little Help from another person taking care of personal grooming?: A Little Help from another person toileting, which includes using toliet, bedpan, or urinal?: A Lot Help from another person bathing (including washing, rinsing, drying)?: A Lot Help from another person to put on and taking off regular upper body clothing?: A Little Help from another person to put on and taking off regular lower body clothing?: A Lot 6 Click Score: 15    End of Session Equipment Utilized During Treatment: Gait belt;Rolling walker (2 wheels)  OT Visit Diagnosis: Unsteadiness on feet (R26.81);Muscle weakness (generalized) (M62.81)   Activity Tolerance Other (comment) (complaints of nausea)   Patient Left in bed;with call bell/phone within reach;with bed alarm set   Nurse Communication Mobility status;Precautions        Time: 0272-5366 OT Time Calculation (min): 19 min  Charges: OT General Charges $OT Visit: 1 Visit OT Treatments $Self Care/Home Management : 8-22 mins  Alfonse Flavors, OTA Acute Rehabilitation Services  Office [phone]   Dewain Penning 04/03/2023, 1:01 PM

## 2023-04-03 NOTE — TOC Transition Note (Signed)
Transition of Care [facility]) - CM/SW Discharge Note   Patient Details  Name: [name] MRN: [id number] Date of Birth: 28-Apr-1934  Transition of Care [facility]) CM/SW Contact:  Ronny Bacon, RN Phone Number: 04/03/2023, 9:57 AM   Clinical Narrative:  Patient is being discharged today. Spoke with patient at bedside, patient declined HH services at this time. Patient reports lives at home with two brothers and has an aide that comes in from 76 am until that assists with cooking, bathing and cleaning.     Final next level of care: Home/Self Care Barriers to Discharge: No Barriers Identified   Patient Goals and CMS Choice      Discharge Placement                         Discharge Plan and Services Additional resources added to the After Visit Summary for                            [facility] Arranged: Patient Refused HH          Social Determinants of Health (SDOH) Interventions SDOH Screenings   Food Insecurity: No Food Insecurity (04/01/2023)  Housing: Low Risk  (04/01/2023)  Transportation Needs: No Transportation Needs (04/01/2023)  Utilities: Not At Risk (04/01/2023)  Alcohol Screen: Low Risk  (04/19/2022)  Depression (PHQ2-9): Low Risk  (01/05/2023)  Financial Resource Strain: Low Risk  (04/19/2022)  Physical Activity: Inactive (04/19/2022)  Social Connections: Socially Isolated (04/19/2022)  Stress: No Stress Concern Present (04/19/2022)  Tobacco Use: Low Risk  (03/31/2023)     Readmission Risk Interventions    02/16/2021   10:54 AM  Readmission Risk Prevention Plan  Transportation Screening Complete  HRI or Home Care Consult Complete  Social Work Consult for Recovery Care Planning/Counseling Complete  Palliative Care Screening Not Applicable  Medication Review [employer]) Complete

## 2023-04-03 NOTE — Plan of Care (Signed)

## 2023-04-03 NOTE — Progress Notes (Signed)
Pt is at 96% on Room Air while sitting up in bed and 93% while walking to and from bathroom.

## 2023-04-04 ENCOUNTER — Telehealth: Payer: Self-pay | Admitting: *Deleted

## 2023-04-04 NOTE — Transitions of Care (Post Inpatient/ED Visit) (Signed)
   04/04/2023  Name: [name] MRN: [id number] DOB: 1933-06-05  Today's TOC FU Call Status: Today's TOC FU Call Status:: Unsuccessful Call (1st Attempt) Unsuccessful Call (1st Attempt) Date: 04/04/23  Attempted to reach the patient regarding the most recent Inpatient/ED visit.  Follow Up Plan: Additional outreach attempts will be made to reach the patient to complete the Transitions of Care (Post Inpatient/ED visit) call.   Irving Shows [facility], BSN RN Care Manager/ Transition of Care [place]/ [facility] [phone]

## 2023-04-05 ENCOUNTER — Telehealth: Payer: Self-pay

## 2023-04-05 NOTE — Transitions of Care (Post Inpatient/ED Visit) (Signed)
   04/05/2023  Name: [name] MRN: [id number] DOB: Jul 18, 1933  Today's TOC FU Call Status: Today's TOC FU Call Status:: Unsuccessful Call (2nd Attempt) Unsuccessful Call (2nd Attempt) Date: 04/05/23  Attempted to reach the patient regarding the most recent Inpatient/ED visit. Left a voice mail with call back number for today.   Follow Up Plan: Additional outreach attempts will be made to reach the patient to complete the Transitions of Care (Post Inpatient/ED visit) call.    Gabriel Cirri MSN, RN [place]  [facility] Management Coordinator [email or web address] Direct Dial: [phone] Fax: [phone]

## 2023-04-06 ENCOUNTER — Telehealth: Payer: Self-pay | Admitting: *Deleted

## 2023-04-06 NOTE — Transitions of Care (Post Inpatient/ED Visit) (Signed)
   04/06/2023  Name: [name] MRN: [id number] DOB: Feb 11, 1934  Today's TOC FU Call Status: Today's TOC FU Call Status:: Unsuccessful Call (3rd Attempt) Unsuccessful Call (3rd Attempt) Date: 04/06/23  Attempted to reach the patient regarding the most recent Inpatient/ED visit.  Follow Up Plan: No further outreach attempts will be made at this time. We have been unable to contact the patient.  Irving Shows [facility], BSN RN Care Manager/ Transition of Care [place]/ [facility] [phone]

## 2023-04-12 ENCOUNTER — Ambulatory Visit: Payer: 59 | Admitting: Family Medicine

## 2023-04-12 ENCOUNTER — Encounter: Payer: Self-pay | Admitting: Family Medicine

## 2023-04-12 VITALS — BP 132/76 | HR 66 | Temp 98.3°F | Ht 59.0 in | Wt 164.0 lb

## 2023-04-12 DIAGNOSIS — Z8709 Personal history of other diseases of the respiratory system: Secondary | ICD-10-CM | POA: Insufficient documentation

## 2023-04-12 DIAGNOSIS — J9601 Acute respiratory failure with hypoxia: Secondary | ICD-10-CM

## 2023-04-12 DIAGNOSIS — Z09 Encounter for follow-up examination after completed treatment for conditions other than malignant neoplasm: Secondary | ICD-10-CM | POA: Diagnosis not present

## 2023-04-12 NOTE — Assessment & Plan Note (Signed)
Symptoms resolved, normal exam findings s/p recent URI requiring hospitalization and antibiotics. Antibiotics and prednisone completed. Thrush resolved. No new medications or changes to medication regimen. Return to office if symptoms return.

## 2023-04-12 NOTE — Progress Notes (Signed)
Subjective:  HPI: [name] is a 89 y.o. adult presenting on 04/12/2023 for Cough (Pt c/o cough/congestion and was admitted to hospital due to pneumonia dx. Pt also was being for thrush. )   HPI Patient is in today for follow up for recent URI. She was hospitalized since making this appointment and treated for acute hypoxemic respiratory failure secondary to URI. Was treated with empiric IV Rocephin and Zithromax and discharged with Doxycycline and Prednisone, she has completed these. CXR while hospitalized showed minimal pleural effusions and she was negative for covid, flu, rsv. She has not been taking Nystatin for thrush due to diarrhea. Symptoms have resolved and she is feeling back to her baseline. She denies SOB, wheezing, fever, chills, body aches, cough.   Review of Systems  All other systems reviewed and are negative.   Relevant past medical history reviewed and updated as indicated.   Past Medical History:  Diagnosis Date   [name] (abdominal aortic aneurysm) (HCC) 09/2013   3.6 cm   Acute and chronic respiratory failure with hypoxia (HCC)    Acute delirium    Acute encephalopathy    Acute pyelonephritis    Atrial fibrillation (HCC)    Chronic pain syndrome    Constipation    Dementia (HCC)    Dependent edema    bilateral legs    Depression    Diabetes mellitus    pre-diabetes   Diverticulosis    Diverticulosis    Dysrhythmia    Dysuria    E coli bacteremia    Fall    Gross hematuria    Hydronephrosis with renal and ureteral calculous obstruction    Hyperlipidemia    Hypertension    Hypoxemia    Idiopathic peripheral autonomic neuropathy    Infestation by bed bug    Nephrolithiasis    OSA on CPAP    Osteoarthritis    Osteopenia    T=  -2.1 in hip   Persistent mood (affective) disorder, unspecified (HCC)    Prediabetes    Rheumatoid arthritis (HCC)    Rheumatoid arthritis(714.0)    S/P carpal tunnel release    Sepsis secondary to UTI [facility])     Spinal stenosis    Unspecified fracture of fifth lumbar vertebra, initial encounter for closed fracture (HCC)    Unspecified fracture of t11-T12 vertebra, initial encounter for closed fracture (HCC)    Urinary incontinence    Urinary tract infection    hx of    Urolithiasis      Past Surgical History:  Procedure Laterality Date   APPENDECTOMY     BIOPSY  02/15/2023   Procedure: BIOPSY;  Surgeon: Corbin Ade, MD;  Location: [facility];  Service: Endoscopy;;   CARPAL TUNNEL RELEASE  12/27/2006   Right subcutaneous ulnar nerve transfer -- Right open carpal tunnel release   CHOLECYSTECTOMY     CYSTOSCOPY W/ URETERAL STENT PLACEMENT Right 01/30/2018   Procedure: CYSTOSCOPY WITH STENT REPLACEMENT retrograde pylegram;  Surgeon: Crist Fat, MD;  Location: [facility];  Service: Urology;  Laterality: Right;   CYSTOSCOPY WITH URETEROSCOPY AND STENT PLACEMENT Right 03/01/2018   Procedure: CYSTOSCPY/RIGHT URETEROSCOPY HOLMIUM LASER LITHOTRIPSY AND STENT EXCHANGE;  Surgeon: Crist Fat, MD;  Location: [facility];  Service: Urology;  Laterality: Right;   ESOPHAGOGASTRODUODENOSCOPY (EGD) WITH PROPOFOL N/A 02/15/2023   Procedure: ESOPHAGOGASTRODUODENOSCOPY (EGD) WITH PROPOFOL;  Surgeon: Corbin Ade, MD;  Location: [facility];  Service: Endoscopy;  Laterality: N/A;  2:15 pm, asa  3/4   FOOT SURGERY     HIP ARTHROPLASTY Left 08/27/2020   Procedure: ARTHROPLASTY BIPOLAR HIP (HEMIARTHROPLASTY);  Surgeon: Oliver Barre, MD;  Location: [facility];  Service: Orthopedics;  Laterality: Left;   HOLMIUM LASER APPLICATION Right 03/01/2018   Procedure: HOLMIUM LASER APPLICATION;  Surgeon: Crist Fat, MD;  Location: [facility];  Service: Urology;  Laterality: Right;   JOINT REPLACEMENT     BTKR, RTHR   MALONEY DILATION N/A 02/15/2023   Procedure: MALONEY DILATION;  Surgeon: Corbin Ade, MD;  Location: [facility];  Service: Endoscopy;  Laterality: N/A;   TOTAL KNEE ARTHROPLASTY   08/21/2007   Right total knee replacement    Allergies and medications reviewed and updated.   Current Outpatient Medications:    albuterol (VENTOLIN HFA) 108 (90 Base) MCG/ACT inhaler, Inhale 2 puffs into the lungs every 4 (four) hours as needed for wheezing or shortness of breath., Disp: 8 g, Rfl: 3   aspirin EC 81 MG tablet, Take 1 tablet (81 mg total) by mouth daily., Disp: , Rfl:    atenolol (TENORMIN) 25 MG tablet, TAKE 1/2 TABLET BY MOUTH DAILY, Disp: 15 tablet, Rfl: 10   Calcium Carbonate-Vitamin D3 600-400 MG-UNIT TABS, Take 1 tablet by mouth daily., Disp: , Rfl:    clotrimazole-betamethasone (LOTRISONE) cream, Apply 1 Application topically daily as needed (fungal infection)., Disp: , Rfl:    DM-APAP-CPM (CORICIDIN HBP PO), Take 1 tablet by mouth every 6 (six) hours as needed (cold symptoms)., Disp: , Rfl:    DULoxetine (CYMBALTA) 30 MG capsule, Take 1 capsule (30 mg total) by mouth daily. (Patient taking differently: Take 30 mg by mouth daily. Take in addition to the Cymbalta 60 mg to equal a total dose of 90 mg per day.), Disp: 90 capsule, Rfl: 1   DULoxetine (CYMBALTA) 60 MG capsule, Take 1 capsule (60 mg total) by mouth daily. Take in addition to the Cymbalta 30 mg to equal a total dose of 90 mg per day., Disp: 90 capsule, Rfl: 1   ezetimibe (ZETIA) 10 MG tablet, TAKE 1 TABLET (10 MG TOTAL) BY MOUTH DAILY., Disp: 90 tablet, Rfl: 3   gabapentin (NEURONTIN) 800 MG tablet, TAKE 1 TABLET BY MOUTH 3 TIMES DAILY, Disp: 90 tablet, Rfl: 5   pantoprazole (PROTONIX) 40 MG tablet, TAKE 1 TABLET BY MOUTH ONCE DAILY, Disp: 90 tablet, Rfl: 1   rosuvastatin (CRESTOR) 40 MG tablet, TAKE 1 TABLET BY MOUTH IN THE EVENING, Disp: 90 tablet, Rfl: 1  Allergies  Allergen Reactions   Sulfonamide Derivatives Hives and Rash    Objective:   BP 132/76   Pulse 66   Temp 98.3 F (36.8 C)   Ht 4\' 11"  (1.499 m)   Wt 164 lb (74.4 kg)   SpO2 93%   BMI 33.12 kg/m      04/12/2023    8:04 AM 04/03/2023     7:38 AM 04/03/2023    5:00 AM  Vitals with BMI  Height 4\' 11"     Weight 164 lbs    BMI 33.11    Systolic 132 131 784  Diastolic 76 93 88  Pulse 66 97 108     Physical Exam Vitals and nursing note reviewed.  Constitutional:      Appearance: Normal appearance. He is normal weight.  HENT:     Head: Normocephalic and atraumatic.  Cardiovascular:     Rate and Rhythm: Normal rate and regular rhythm.     Pulses: Normal pulses.  Heart sounds: Normal heart sounds.  Pulmonary:     Effort: Pulmonary effort is normal.     Breath sounds: Normal breath sounds.  Skin:    General: Skin is warm and dry.     Capillary Refill: Capillary refill takes less than 2 seconds.  Neurological:     General: No focal deficit present.     Mental Status: He is alert and oriented to person, place, and time. Mental status is at baseline.  Psychiatric:        Mood and Affect: Mood normal.        Behavior: Behavior normal.        Thought Content: Thought content normal.        Judgment: Judgment normal.     Assessment & Plan:  History of URI (upper respiratory infection) Assessment & Plan: Symptoms resolved, normal exam findings s/p recent URI requiring hospitalization and antibiotics. Antibiotics and prednisone completed. Thrush resolved. No new medications or changes to medication regimen. Return to office if symptoms return.      Follow up plan: Return if symptoms worsen or fail to improve.  Park Meo, FNP

## 2023-04-18 ENCOUNTER — Ambulatory Visit (HOSPITAL_COMMUNITY)
Admission: RE | Admit: 2023-04-18 | Discharge: 2023-04-18 | Disposition: A | Discharge status: Home / Self Care | Payer: 59 | Source: Ambulatory Visit | Attending: Cardiology | Admitting: Cardiology

## 2023-04-18 ENCOUNTER — Other Ambulatory Visit: Payer: Self-pay | Admitting: Family Medicine

## 2023-04-18 DIAGNOSIS — I714 Abdominal aortic aneurysm, without rupture, unspecified: Secondary | ICD-10-CM | POA: Diagnosis present

## 2023-04-18 DIAGNOSIS — I7141 Pararenal abdominal aortic aneurysm, without rupture: Secondary | ICD-10-CM | POA: Diagnosis not present

## 2023-04-20 ENCOUNTER — Encounter: Payer: Self-pay | Admitting: *Deleted

## 2023-04-20 ENCOUNTER — Ambulatory Visit: Payer: 59 | Admitting: Podiatry

## 2023-04-20 ENCOUNTER — Other Ambulatory Visit: Payer: Self-pay | Admitting: *Deleted

## 2023-04-20 ENCOUNTER — Encounter: Payer: Self-pay | Admitting: Podiatry

## 2023-04-20 DIAGNOSIS — M79676 Pain in unspecified toe(s): Secondary | ICD-10-CM | POA: Diagnosis not present

## 2023-04-20 DIAGNOSIS — D2371 Other benign neoplasm of skin of right lower limb, including hip: Secondary | ICD-10-CM | POA: Diagnosis not present

## 2023-04-20 DIAGNOSIS — E1142 Type 2 diabetes mellitus with diabetic polyneuropathy: Secondary | ICD-10-CM

## 2023-04-20 DIAGNOSIS — D2372 Other benign neoplasm of skin of left lower limb, including hip: Secondary | ICD-10-CM | POA: Diagnosis not present

## 2023-04-20 DIAGNOSIS — I714 Abdominal aortic aneurysm, without rupture, unspecified: Secondary | ICD-10-CM

## 2023-04-20 DIAGNOSIS — B351 Tinea unguium: Secondary | ICD-10-CM

## 2023-04-20 MED ORDER — NAFTIFINE HCL 2 % EX CREA: 1.0000 | TOPICAL_CREAM | Freq: Two times a day (BID) | CUTANEOUS | 2 refills | Status: DC

## 2023-04-24 NOTE — Progress Notes (Signed)
She presents today chief complaint of painful elongated toenails.  Objective: Toenails are long thick yellow dystrophic-like mycotic painful palpation as well as debridement.  Assessment: Pain in limb secondary to onychomycosis.  Plan: Debridement of toenails 1 through 5 bilateral.

## 2023-04-27 ENCOUNTER — Ambulatory Visit: Payer: 59

## 2023-04-27 VITALS — Ht 59.0 in | Wt 164.0 lb

## 2023-04-27 DIAGNOSIS — Z0000 Encounter for general adult medical examination without abnormal findings: Secondary | ICD-10-CM

## 2023-04-27 NOTE — Progress Notes (Signed)
Subjective:   [name] is a 89 y.o. female who presents for Medicare Annual (Subsequent) preventive examination.  Visit Complete: Virtual I connected with  [name] and son, Yvonne Needle on 04/27/23 by a audio enabled telemedicine application and verified that I am speaking with the correct person using two identifiers.  Patient Location: Home  Provider Location: Home Office  I discussed the limitations of evaluation and management by telemedicine. The patient expressed understanding and agreed to proceed.  Vital Signs: Because this visit was a virtual/telehealth visit, some criteria may be missing or patient reported. Any vitals not documented were not able to be obtained and vitals that have been documented are patient reported.  Cardiac Risk Factors include: advanced age (>26men, >19 women);hypertension;dyslipidemia;family history of premature cardiovascular disease;sedentary lifestyle     Objective:    Today's Vitals   04/27/23 1011 04/27/23 1012  Weight: 164 lb (74.4 kg)   Height: 4\' 11"  (1.499 m)   PainSc: 6  6   PainLoc: Generalized    Body mass index is 33.12 kg/m.     04/27/2023   10:13 AM 04/01/2023    3:00 AM 02/15/2023   11:23 AM 02/14/2023    2:58 PM 04/19/2022   11:12 AM 10/15/2021    3:00 PM 10/15/2021    4:00 AM  Advanced Directives  Does Patient Have a Medical Advance Directive? Yes Yes No No Yes No No  Type of Estate agent of [employer];Living will Healthcare Power of Attorney   Living will;Healthcare Power of Attorney    Does patient want to make changes to medical advance directive?  No - Patient declined   No - Patient declined    Copy of Healthcare Power of Attorney in Chart? No - copy requested No - copy requested   No - copy requested    Would patient like information on creating a medical advance directive?    No - Patient declined  No - Patient declined No - Patient declined    Current Medications  (verified) Outpatient Encounter Medications as of 04/27/2023  Medication Sig   albuterol (VENTOLIN HFA) 108 (90 Base) MCG/ACT inhaler Inhale 2 puffs into the lungs every 4 (four) hours as needed for wheezing or shortness of breath.   aspirin EC 81 MG tablet Take 1 tablet (81 mg total) by mouth daily.   atenolol (TENORMIN) 25 MG tablet TAKE 1/2 TABLET BY MOUTH DAILY   Calcium Carbonate-Vitamin D3 600-400 MG-UNIT TABS Take 1 tablet by mouth daily.   clotrimazole-betamethasone (LOTRISONE) cream Apply 1 Application topically daily as needed (fungal infection).   DM-APAP-CPM (CORICIDIN HBP PO) Take 1 tablet by mouth every 6 (six) hours as needed (cold symptoms).   DULoxetine (CYMBALTA) 30 MG capsule Take 1 capsule (30 mg total) by mouth daily. (Patient taking differently: Take 30 mg by mouth daily. Take in addition to the Cymbalta 60 mg to equal a total dose of 90 mg per day.)   DULoxetine (CYMBALTA) 60 MG capsule Take 1 capsule (60 mg total) by mouth daily. Take in addition to the Cymbalta 30 mg to equal a total dose of 90 mg per day.   ezetimibe (ZETIA) 10 MG tablet TAKE 1 TABLET (10 MG TOTAL) BY MOUTH DAILY.   gabapentin (NEURONTIN) 800 MG tablet TAKE 1 TABLET BY MOUTH 3 TIMES DAILY   Naftifine HCl (NAFTIN) 2 % CREA Apply 1 Application topically 2 (two) times daily.   pantoprazole (PROTONIX) 40 MG tablet TAKE 1 TABLET BY MOUTH  ONCE DAILY   rosuvastatin (CRESTOR) 40 MG tablet TAKE 1 TABLET BY MOUTH IN THE EVENING   No facility-administered encounter medications on file as of 04/27/2023.    Allergies (verified) Sulfonamide derivatives   History: Past Medical History:  Diagnosis Date   [name] (abdominal aortic aneurysm) (HCC) 09/2013   3.6 cm   Acute and chronic respiratory failure with hypoxia (HCC)    Acute delirium    Acute encephalopathy    Acute pyelonephritis    Atrial fibrillation (HCC)    Chronic pain syndrome    Constipation    Dementia (HCC)    Dependent edema    bilateral legs     Depression    Diabetes mellitus    pre-diabetes   Diverticulosis    Diverticulosis    Dysrhythmia    Dysuria    E coli bacteremia    Fall    Gross hematuria    Hydronephrosis with renal and ureteral calculous obstruction    Hyperlipidemia    Hypertension    Hypoxemia    Idiopathic peripheral autonomic neuropathy    Infestation by bed bug    Nephrolithiasis    OSA on CPAP    Osteoarthritis    Osteopenia    T=  -2.1 in hip   Persistent mood (affective) disorder, unspecified (HCC)    Prediabetes    Rheumatoid arthritis (HCC)    Rheumatoid arthritis(714.0)    S/P carpal tunnel release    Sepsis secondary to UTI [facility]    Spinal stenosis    Unspecified fracture of fifth lumbar vertebra, initial encounter for closed fracture (HCC)    Unspecified fracture of t11-T12 vertebra, initial encounter for closed fracture (HCC)    Urinary incontinence    Urinary tract infection    hx of    Urolithiasis    Past Surgical History:  Procedure Laterality Date   APPENDECTOMY     BIOPSY  02/15/2023   Procedure: BIOPSY;  Surgeon: Corbin Ade, MD;  [facility]: [facility];  Service: Endoscopy;;   CARPAL TUNNEL RELEASE  12/27/2006   Right subcutaneous ulnar nerve transfer -- Right open carpal tunnel release   CHOLECYSTECTOMY     CYSTOSCOPY W/ URETERAL STENT PLACEMENT Right 01/30/2018   Procedure: CYSTOSCOPY WITH STENT REPLACEMENT retrograde pylegram;  Surgeon: Crist Fat, MD;  Location: [facility];  Service: Urology;  Laterality: Right;   CYSTOSCOPY WITH URETEROSCOPY AND STENT PLACEMENT Right 03/01/2018   Procedure: CYSTOSCPY/RIGHT URETEROSCOPY HOLMIUM LASER LITHOTRIPSY AND STENT EXCHANGE;  Surgeon: Crist Fat, MD;  Location: [facility];  Service: Urology;  Laterality: Right;   ESOPHAGOGASTRODUODENOSCOPY (EGD) WITH PROPOFOL N/A 02/15/2023   Procedure: ESOPHAGOGASTRODUODENOSCOPY (EGD) WITH PROPOFOL;  Surgeon: Corbin Ade, MD;  [facility];  Service: Endoscopy;   Laterality: N/A;  2:15 pm, asa 3/4   FOOT SURGERY     HIP ARTHROPLASTY Left 08/27/2020   Procedure: ARTHROPLASTY BIPOLAR HIP (HEMIARTHROPLASTY);  Surgeon: Oliver Barre, MD;  Location: [facility];  Service: Orthopedics;  Laterality: Left;   HOLMIUM LASER APPLICATION Right 03/01/2018   Procedure: HOLMIUM LASER APPLICATION;  Surgeon: Crist Fat, MD;  Location: [facility];  Service: Urology;  Laterality: Right;   JOINT REPLACEMENT     BTKR, RTHR   MALONEY DILATION N/A 02/15/2023   Procedure: MALONEY DILATION;  Surgeon: Corbin Ade, MD;  [facility];  Service: Endoscopy;  Laterality: N/A;   TOTAL KNEE ARTHROPLASTY  08/21/2007   Right total knee replacement   Family  History  Problem Relation Age of Onset   Stroke Mother    Stroke Father    Hypertension Sister    Diabetes Brother    Coronary artery disease Son        CABG at age 45   Heart disease Son    Heart attack Son    Hypertension Maternal Aunt    Cancer Daughter    Colon cancer Neg Hx    Social History   Socioeconomic History   Marital status: Divorced    Spouse name: Not on file   Number of children: 6   Years of education: 8th grade   Highest education level: Not on file  Occupational History   Occupation: Retired  Tobacco Use   Smoking status: Never   Smokeless tobacco: Never  Vaping Use   Vaping status: Never Used  Substance and Sexual Activity   Alcohol use: No   Drug use: No   Sexual activity: Not on file  Other Topics Concern   Not on file  Social History Narrative   Lives at home with 2 sons   She has four living children.   Right-handed.   Two cups caffeine per day.   Social Drivers of Corporate investment banker Strain: Low Risk  (04/27/2023)   Overall Financial Resource Strain (CARDIA)    Difficulty of Paying Living Expenses: Not hard at all  Food Insecurity: No Food Insecurity (04/27/2023)   Hunger Vital Sign    Worried About Running Out of Food in the Last Year: Never true     Ran Out of Food in the Last Year: Never true  Transportation Needs: No Transportation Needs (04/27/2023)   PRAPARE - Administrator, Civil Service (Medical): No    Lack of Transportation (Non-Medical): No  Physical Activity: Inactive (04/27/2023)   Exercise Vital Sign    Days of Exercise per Week: 0 days    Minutes of Exercise per Session: 0 min  Stress: No Stress Concern Present (04/27/2023)   [employer] of Occupational Health - Occupational Stress Questionnaire    Feeling of Stress : Not at all  Social Connections: Socially Isolated (04/27/2023)   Social Connection and Isolation Panel [NHANES]    Frequency of Communication with Friends and Family: More than three times a week    Frequency of Social Gatherings with Friends and Family: Three times a week    Attends Religious Services: Never    Active Member of Clubs or Organizations: No    Attends Banker Meetings: Never    Marital Status: Divorced    Tobacco Counseling Counseling given: Not Answered   Clinical Intake:  Pre-visit preparation completed: Yes  Pain : 0-10 Pain Score: 6  Pain Type: Chronic pain Pain Location: Generalized     BMI - recorded: 33.12 Nutritional Status: BMI > 30  Obese Nutritional Risks: None Diabetes: No  How often do you need to have someone help you when you read instructions, pamphlets, or other written materials from your doctor or pharmacy?: 1 - Never What is the last grade level you completed in school?: 7th grade  Interpreter Needed?: No  Information entered by :: Susie Cassette, LPN.   Activities of Daily Living    04/27/2023   10:16 AM 04/01/2023    3:00 AM  In your present state of health, do you have any difficulty performing the following activities:  Hearing? 1 1  Comment has hearing aids   Vision? 0 0  Difficulty  concentrating or making decisions? 1 0  Comment dementia, cognitive impairment   Walking or climbing stairs? 1   Comment  uses a walker   Dressing or bathing? 1   Comment has an aid during the week to assist with ADLs   Doing errands, shopping? 1 1  Preparing Food and eating ? Y   Using the Toilet? Y   In the past six months, have you accidently leaked urine? Y   Do you have problems with loss of bowel control? Y   Managing your Medications? Y   Managing your Finances? Y   Housekeeping or managing your Housekeeping? Y     Patient Care Team: Donita Brooks, MD as PCP - General (Family Medicine) Jens Som Madolyn Frieze, MD as PCP - Cardiology (Cardiology) Elinor Parkinson, DPM as Consulting Physician (Podiatry)  Indicate any recent Medical Services you may have received from other than [facility] providers in the past year (date may be approximate).     Assessment:   This is a routine wellness examination for [name].  Hearing/Vision screen Hearing Screening - Comments:: Wears hearing aids for both ears. Vision Screening - Comments:: Wears rx glasses - up to date with routine eye exams with [facility]    Goals Addressed             This Visit's Progress    Client understands the importance of follow-up with providers by attending scheduled visits        Depression Screen    04/27/2023   10:14 AM 04/12/2023    8:10 AM 01/05/2023    2:39 PM 04/19/2022   11:10 AM 10/01/2021    3:42 PM 12/07/2020    4:01 PM 12/07/2020    3:35 PM  PHQ 2/9 Scores  PHQ - 2 Score 0 0 0 0 0 0 0  PHQ- 9 Score 0          Fall Risk    04/27/2023   10:23 AM 04/12/2023    8:10 AM 01/05/2023    2:39 PM 04/19/2022   11:06 AM 10/01/2021    3:42 PM  Fall Risk   Falls in the past year? 1 1 0 0 0  Number falls in past yr: 0 0  0   Injury with Fall? 1 1  0   Risk for fall due to : History of fall(s);Impaired balance/gait;Impaired mobility History of fall(s);Impaired balance/gait;Impaired mobility  Impaired balance/gait;Impaired mobility   Follow up Education provided;Falls prevention discussed;Falls evaluation completed  Education provided;Falls prevention discussed;Falls evaluation completed  Falls evaluation completed;Education provided;Falls prevention discussed Falls evaluation completed    MEDICARE RISK AT HOME: Medicare Risk at Home Any stairs in or around the home?: No (use a ramp outside) If so, are there any without handrails?: No Home free of loose throw rugs in walkways, pet beds, electrical cords, etc?: Yes Adequate lighting in your home to reduce risk of falls?: Yes Life alert?: No Use of a cane, walker or w/c?: Yes Grab bars in the bathroom?: Yes Shower chair or bench in shower?: Yes Elevated toilet seat or a handicapped toilet?: Yes  TIMED UP AND GO:  Was the test performed?  No    Cognitive Function:    04/27/2023   10:15 AM 07/17/2019    2:01 PM 01/24/2019   11:00 AM  MMSE - Mini Mental State Exam  Not completed: Unable to complete    Orientation to time  4 4  Orientation to Place  4 4  Registration  3 3  Attention/ Calculation  0 3  Recall  0 1  Language- name 2 objects  2 2  Language- repeat  1 1  Language- follow 3 step command  3 3  Language- read & follow direction  1 1  Write a sentence  0 0  Copy design  0 0  Total score  18 22        04/19/2022   11:13 AM  6CIT Screen  What Year? 4 points  What month? 0 points  What time? 0 points  Count back from 20 0 points  Months in reverse 4 points  Repeat phrase 6 points  Total Score 14 points    Immunizations Immunization History  Administered Date(s) Administered   Fluad Quad(high Dose 65+) 04/07/2020   Influenza Whole 02/17/2005, 03/15/2007, 02/21/2008   Influenza,inj,Quad PF,6+ Mos 01/11/2013, 06/17/2016, 01/05/2017   Influenza-Unspecified 02/06/2012   Pneumococcal Conjugate-13 08/23/2014   Pneumococcal Polysaccharide-23 07/23/2013   Zoster, Live 08/23/2014    TDAP status: Due, Education has been provided regarding the importance of this vaccine. Advised may receive this vaccine at local pharmacy or  Health Dept. Aware to provide a copy of the vaccination record if obtained from local pharmacy or Health Dept. Verbalized acceptance and understanding.  Flu Vaccine status: Due, Education has been provided regarding the importance of this vaccine. Advised may receive this vaccine at local pharmacy or Health Dept. Aware to provide a copy of the vaccination record if obtained from local pharmacy or Health Dept. Verbalized acceptance and understanding.  Pneumococcal vaccine status: Up to date  Covid-19 vaccine status: Declined, Education has been provided regarding the importance of this vaccine but patient still declined. Advised may receive this vaccine at local pharmacy or Health Dept.or vaccine clinic. Aware to provide a copy of the vaccination record if obtained from local pharmacy or Health Dept. Verbalized acceptance and understanding.  Qualifies for Shingles Vaccine? Yes   Zostavax completed Yes   Shingrix Completed?: No.    Education has been provided regarding the importance of this vaccine. Patient has been advised to call insurance company to determine out of pocket expense if they have not yet received this vaccine. Advised may also receive vaccine at local pharmacy or Health Dept. Verbalized acceptance and understanding.  Screening Tests Health Maintenance  Topic Date Due   COVID-19 Vaccine (1) 04/28/2023 (Originally 12/30/1938)   Zoster Vaccines- Shingrix (1 of 2) 07/11/2023 (Originally 12/31/1952)   INFLUENZA VACCINE  07/31/2023 (Originally 12/01/2022)   DTaP/Tdap/Td (1 - Tdap) 04/11/2024 (Originally 12/31/1952)   Medicare Annual Wellness (AWV)  04/26/2024   Pneumonia Vaccine 20+ Years old  Completed   DEXA SCAN  Completed   HPV VACCINES  Aged Out    Health Maintenance  There are no preventive care reminders to display for this patient.   Colorectal cancer screening: No longer required.   Mammogram status: No longer required due to age.  Bone density status: No longer  required or recommended.  Lung Cancer Screening: (Low Dose CT Chest recommended if Age 76-80 years, 20 pack-year currently smoking OR have quit w/in 15years.) does not qualify.   Lung Cancer Screening Referral: no  Additional Screening:  Hepatitis C Screening: does not qualify.  Vision Screening: Recommended annual ophthalmology exams for early detection of glaucoma and other disorders of the eye. Is the patient up to date with their annual eye exam?  Yes  Who is the provider or what is the name of the office in which  the patient attends annual eye exams? [facility] If pt is not established with a provider, would they like to be referred to a provider to establish care? No .   Dental Screening: Recommended annual dental exams for proper oral hygiene  Community Resource Referral / Chronic Care Management: CRR required this visit?  No   CCM required this visit?  No     Plan:     I have personally reviewed and noted the following in the patient's chart:   Medical and social history Use of alcohol, tobacco or illicit drugs  Current medications and supplements including opioid prescriptions. Patient is not currently taking opioid prescriptions. Functional ability and status Nutritional status Physical activity Advanced directives List of other physicians Hospitalizations, surgeries, and ER visits in previous 12 months Vitals Screenings to include cognitive, depression, and falls Referrals and appointments  In addition, I have reviewed and discussed with patient certain preventive protocols, quality metrics, and best practice recommendations. A written personalized care plan for preventive services as well as general preventive health recommendations were provided to patient.     Mickeal Needy, LPN   [id number]   After Visit Summary: (MyChart) Due to this being a telephonic visit, the after visit summary with patients personalized plan was offered to patient  via MyChart   Nurse Notes: None at this time.  Broadus John (son, POA) completed AWV for patient due to her having dementia.

## 2023-04-27 NOTE — Patient Instructions (Signed)
Mr. Kaminskas , Thank you for taking time to come for your Medicare Wellness Visit. I appreciate your ongoing commitment to your health goals. Please review the following plan we discussed and let me know if I can assist you in the future.   Referrals/Orders/Follow-Ups/Clinician Recommendations: Continue to follow up with Dr. Tanya Nones and any other providers that you may see to maintain your health.  This is a list of the screening recommended for you and due dates:  Health Maintenance  Topic Date Due   COVID-19 Vaccine (1) 04/28/2023*   Zoster (Shingles) Vaccine (1 of 2) 07/11/2023*   Flu Shot  07/31/2023*   DTaP/Tdap/Td vaccine (1 - Tdap) 04/11/2024*   Medicare Annual Wellness Visit  04/26/2024   Pneumonia Vaccine  Completed   DEXA scan (bone density measurement)  Completed   HPV Vaccine  Aged Out  *Topic was postponed. The date shown is not the original due date.    Advanced directives: (Copy Requested) Please bring a copy of your health care power of attorney and living will to the office to be added to your chart at your convenience.  Next Medicare Annual Wellness Visit scheduled for next year: No

## 2023-05-02 ENCOUNTER — Ambulatory Visit (INDEPENDENT_AMBULATORY_CARE_PROVIDER_SITE_OTHER): Payer: 59 | Admitting: Family Medicine

## 2023-05-02 ENCOUNTER — Encounter: Payer: Self-pay | Admitting: Family Medicine

## 2023-05-02 VITALS — BP 124/76 | HR 92 | Temp 98.6°F | Ht 59.0 in | Wt 164.0 lb

## 2023-05-02 DIAGNOSIS — M25561 Pain in right knee: Secondary | ICD-10-CM | POA: Diagnosis not present

## 2023-05-02 DIAGNOSIS — M25562 Pain in left knee: Secondary | ICD-10-CM

## 2023-05-02 DIAGNOSIS — G44209 Tension-type headache, unspecified, not intractable: Secondary | ICD-10-CM

## 2023-05-02 DIAGNOSIS — G8929 Other chronic pain: Secondary | ICD-10-CM

## 2023-05-02 MED ORDER — PREDNISONE 20 MG PO TABS: ORAL_TABLET | ORAL | 0 refills | Status: DC

## 2023-05-02 MED ORDER — TIZANIDINE HCL 4 MG PO TABS: 4.0000 mg | ORAL_TABLET | Freq: Four times a day (QID) | ORAL | 0 refills | Status: DC | PRN

## 2023-05-02 MED ORDER — MELOXICAM 7.5 MG PO TABS: 7.5000 mg | ORAL_TABLET | Freq: Every day | ORAL | 0 refills | Status: DC

## 2023-05-02 NOTE — Progress Notes (Signed)
 Subjective:    Patient ID: [name], adult    DOB: 10/29/1933, 89 y.o.   MRN: [id number]  HPI Patient has a history of a remote stroke, hypertension, hyperlipidemia, and prediabetes. 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  Patient presents with a 5-day history of right temporal headache.  The pain is located above her right ear and just posterior to her right ear.  The current pressure-like pain.  There is no pulsatile nature to it.  It feels better if she puts pressure on her scalp or massages her scalp.  Close examination of the scalp reveals no cellulitis or shingles.  There is no bruising on the scalp.  She denies any falls.  She denies hitting her head.  Son states that the pain comes and goes.  It gradually improves with Tylenol  and then returns.  She denies any fevers or chills.  She denies any neurologic deficit.  She denies any numbness or tingling or weakness in her extremities.  She is at her baseline mental status.  She is not confused.  She denies any neck stiffness.  She denies any pain in her jaw.  There is no pain chewing.  She denies any blurry vision.  There is no tenderness to palpation of the temporal artery.  She also complains of pain in her hand I merrily the MCP PT and DP joints.  She has diffuse osteoarthritic deformities including Heberden nodules on several of the joints of her fingers.  She is not currently on any NSAID.  Her last creatinine earlier this month was acceptable.  She also complains of bilateral knee pain and she would like to see an orthopedist to discuss possible cortisone injections.  She has a remote history of a knee replacement Past Medical History:  Diagnosis Date   [name] (abdominal aortic aneurysm) (HCC) 09/2013   3.6 cm   Acute and chronic respiratory failure with hypoxia (HCC)    Acute delirium    Acute encephalopathy    Acute pyelonephritis    Atrial fibrillation (HCC)    Chronic pain syndrome    Constipation     Dementia (HCC)    Dependent edema    bilateral legs    Depression    Diabetes mellitus    pre-diabetes   Diverticulosis    Diverticulosis    Dysrhythmia    Dysuria    E coli bacteremia    Fall    Gross hematuria    Hydronephrosis with renal and ureteral calculous obstruction    Hyperlipidemia    Hypertension    Hypoxemia    Idiopathic peripheral autonomic neuropathy    Infestation by bed bug    Nephrolithiasis    OSA on CPAP    Osteoarthritis    Osteopenia    T=  -2.1 in hip   Persistent mood (affective) disorder, unspecified (HCC)    Prediabetes    Rheumatoid arthritis (HCC)    Rheumatoid arthritis(714.0)    S/P carpal tunnel release    Sepsis secondary to UTI [facility])    Spinal stenosis    Unspecified fracture of fifth lumbar vertebra, initial encounter for closed fracture (HCC)    Unspecified fracture of t11-T12 vertebra, initial encounter for closed fracture (HCC)    Urinary incontinence    Urinary tract infection    hx of    Urolithiasis    Past Surgical History:  Procedure Laterality Date   APPENDECTOMY     BIOPSY  02/15/2023   Procedure: BIOPSY;  Surgeon: Shaaron Lamar HERO, MD;  Location: [facility];  Service: Endoscopy;;   CARPAL TUNNEL RELEASE  12/27/2006   Right subcutaneous ulnar nerve transfer -- Right open carpal tunnel release   CHOLECYSTECTOMY     CYSTOSCOPY W/ URETERAL STENT PLACEMENT Right 01/30/2018   Procedure: CYSTOSCOPY WITH STENT REPLACEMENT retrograde pylegram;  Surgeon: Cam Morene ORN, MD;  Location: [facility];  Service: Urology;  Laterality: Right;   CYSTOSCOPY WITH URETEROSCOPY AND STENT PLACEMENT Right 03/01/2018   Procedure: CYSTOSCPY/RIGHT URETEROSCOPY HOLMIUM LASER LITHOTRIPSY AND STENT EXCHANGE;  Surgeon: Cam Morene ORN, MD;  Location: [facility];  Service: Urology;  Laterality: Right;   ESOPHAGOGASTRODUODENOSCOPY (EGD) WITH PROPOFOL  N/A 02/15/2023   Procedure: ESOPHAGOGASTRODUODENOSCOPY (EGD) WITH PROPOFOL ;  Surgeon: Shaaron Lamar HERO,  MD;  Location: [facility];  Service: Endoscopy;  Laterality: N/A;  2:15 pm, asa 3/4   FOOT SURGERY     HIP ARTHROPLASTY Left 08/27/2020   Procedure: ARTHROPLASTY BIPOLAR HIP (HEMIARTHROPLASTY);  Surgeon: Onesimo Oneil LABOR, MD;  Location: [facility];  Service: Orthopedics;  Laterality: Left;   HOLMIUM LASER APPLICATION Right 03/01/2018   Procedure: HOLMIUM LASER APPLICATION;  Surgeon: Cam Morene ORN, MD;  Location: [facility];  Service: Urology;  Laterality: Right;   JOINT REPLACEMENT     BTKR, RTHR   MALONEY DILATION N/A 02/15/2023   Procedure: MALONEY DILATION;  Surgeon: Shaaron Lamar HERO, MD;  Location: [facility];  Service: Endoscopy;  Laterality: N/A;   TOTAL KNEE ARTHROPLASTY  08/21/2007   Right total knee replacement   Current Outpatient Medications on File Prior to Visit  Medication Sig Dispense Refill   albuterol  (VENTOLIN  HFA) 108 (90 Base) MCG/ACT inhaler Inhale 2 puffs into the lungs every 4 (four) hours as needed for wheezing or shortness of breath. 8 g 3   aspirin  EC 81 MG tablet Take 1 tablet (81 mg total) by mouth daily.     atenolol  (TENORMIN ) 25 MG tablet TAKE 1/2 TABLET BY MOUTH DAILY 15 tablet 10   Calcium  Carbonate-Vitamin D3 600-400 MG-UNIT TABS Take 1 tablet by mouth daily.     clotrimazole -betamethasone  (LOTRISONE ) cream Apply 1 Application topically daily as needed (fungal infection).     DM-APAP-CPM (CORICIDIN HBP PO) Take 1 tablet by mouth every 6 (six) hours as needed (cold symptoms).     DULoxetine  (CYMBALTA ) 30 MG capsule Take 1 capsule (30 mg total) by mouth daily. (Patient taking differently: Take 30 mg by mouth daily. Take in addition to the Cymbalta  60 mg to equal a total dose of 90 mg per day.) 90 capsule 1   DULoxetine  (CYMBALTA ) 60 MG capsule Take 1 capsule (60 mg total) by mouth daily. Take in addition to the Cymbalta  30 mg to equal a total dose of 90 mg per day. 90 capsule 1   ezetimibe  (ZETIA ) 10 MG tablet TAKE 1 TABLET (10 MG TOTAL) BY MOUTH DAILY. 90  tablet 3   gabapentin  (NEURONTIN ) 800 MG tablet TAKE 1 TABLET BY MOUTH 3 TIMES DAILY 90 tablet 10   Naftifine  HCl (NAFTIN ) 2 % CREA Apply 1 Application topically 2 (two) times daily. 60 g 2   pantoprazole  (PROTONIX ) 40 MG tablet TAKE 1 TABLET BY MOUTH ONCE DAILY 90 tablet 1   rosuvastatin  (CRESTOR ) 40 MG tablet TAKE 1 TABLET BY MOUTH IN THE EVENING 90 tablet 1   No current facility-administered medications on file prior to visit.   Allergies  Allergen Reactions   Sulfonamide Derivatives Hives and Rash  Social History   Socioeconomic History   Marital status: Divorced    Spouse name: Not on file   Number of children: 6   Years of education: 8th grade   Highest education level: Not on file  Occupational History   Occupation: Retired  Tobacco Use   Smoking status: Never   Smokeless tobacco: Never  Vaping Use   Vaping status: Never Used  Substance and Sexual Activity   Alcohol use: No   Drug use: No   Sexual activity: Not on file  Other Topics Concern   Not on file  Social History Narrative   Lives at home with 2 sons   She has four living children.   Right-handed.   Two cups caffeine per day.   Social Drivers of Corporate Investment Banker Strain: Low Risk  (04/27/2023)   Overall Financial Resource Strain (CARDIA)    Difficulty of Paying Living Expenses: Not hard at all  Food Insecurity: No Food Insecurity (04/27/2023)   Hunger Vital Sign    Worried About Running Out of Food in the Last Year: Never true    Ran Out of Food in the Last Year: Never true  Transportation Needs: No Transportation Needs (04/27/2023)   PRAPARE - Administrator, Civil Service (Medical): No    Lack of Transportation (Non-Medical): No  Physical Activity: Inactive (04/27/2023)   Exercise Vital Sign    Days of Exercise per Week: 0 days    Minutes of Exercise per Session: 0 min  Stress: No Stress Concern Present (04/27/2023)   [employer] of Occupational Health - Occupational  Stress Questionnaire    Feeling of Stress : Not at all  Social Connections: Socially Isolated (04/27/2023)   Social Connection and Isolation Panel [NHANES]    Frequency of Communication with Friends and Family: More than three times a week    Frequency of Social Gatherings with Friends and Family: Three times a week    Attends Religious Services: Never    Active Member of Clubs or Organizations: No    Attends Banker Meetings: Never    Marital Status: Divorced  Catering Manager Violence: Not At Risk (04/27/2023)   Humiliation, Afraid, Rape, and Kick questionnaire    Fear of Current or Ex-Partner: No    Emotionally Abused: No    Physically Abused: No    Sexually Abused: No     Review of Systems  All other systems reviewed and are negative.      Objective:   Physical Exam Constitutional:      General: He is not in acute distress.    Appearance: Normal appearance. He is normal weight. He is not ill-appearing, toxic-appearing or diaphoretic.  HENT:     Head: Normocephalic and atraumatic.  Eyes:     General: No visual field deficit or scleral icterus.    Extraocular Movements: Extraocular movements intact.     Right eye: Normal extraocular motion.     Left eye: Normal extraocular motion.     Pupils: Pupils are equal, round, and reactive to light.  Cardiovascular:     Rate and Rhythm: Normal rate. Rhythm irregular.     Heart sounds: Normal heart sounds. No murmur heard.    No gallop.  Pulmonary:     Effort: Pulmonary effort is normal. No tachypnea or respiratory distress.     Breath sounds: No wheezing or rhonchi.  Chest:     Chest wall: No tenderness or edema.  Abdominal:  General: There is no distension.     Tenderness: There is no abdominal tenderness. There is no guarding or rebound.  Musculoskeletal:     Cervical back: Normal range of motion.     Right lower leg: No edema.     Left lower leg: No edema.  Neurological:     Mental Status: He is alert  and oriented to person, place, and time. Mental status is at baseline.     Cranial Nerves: No cranial nerve deficit, dysarthria or facial asymmetry.     Sensory: No sensory deficit.     Motor: No weakness.     Coordination: Coordination normal.     Gait: Gait normal.           Assessment & Plan:  Chronic pain of both knees - Plan: Ambulatory referral to Orthopedic Surgery  Tension headache I believe the patient is having a tension headache.  There is no evidence on her exam for a stroke.  There is no evidence of any increased intracranial pressure.  She denies any falls or concussions.  I will treat the patient with a prednisone  taper pack as an anti-inflammatory coupled with tizanidine  4 mg every 8 hours as needed for muscle tension.  If the headache worsens I want her to go to the emergency room.  If she develops any neurologic symptoms or go to the emergency room.  No evidence of pleuritis on exam today.  I will consult orthopedic surgery regarding cortisone injections in her knees.  In the future we may treat osteoarthritis in her hands with meloxicam .  However we have to be very careful with this given her renal function

## 2023-05-08 ENCOUNTER — Emergency Department (HOSPITAL_COMMUNITY): Payer: 59

## 2023-05-08 ENCOUNTER — Other Ambulatory Visit: Payer: Self-pay

## 2023-05-08 ENCOUNTER — Encounter (HOSPITAL_COMMUNITY): Payer: Self-pay

## 2023-05-08 ENCOUNTER — Emergency Department (HOSPITAL_COMMUNITY)
Admission: EM | Admit: 2023-05-08 | Discharge: 2023-05-08 | Disposition: A | Discharge status: Home / Self Care | Payer: 59 | Attending: Emergency Medicine | Admitting: Emergency Medicine

## 2023-05-08 DIAGNOSIS — I5030 Unspecified diastolic (congestive) heart failure: Secondary | ICD-10-CM | POA: Insufficient documentation

## 2023-05-08 DIAGNOSIS — W19XXXA Unspecified fall, initial encounter: Secondary | ICD-10-CM | POA: Diagnosis not present

## 2023-05-08 DIAGNOSIS — Z7982 Long term (current) use of aspirin: Secondary | ICD-10-CM | POA: Insufficient documentation

## 2023-05-08 DIAGNOSIS — I4891 Unspecified atrial fibrillation: Secondary | ICD-10-CM | POA: Diagnosis not present

## 2023-05-08 DIAGNOSIS — N189 Chronic kidney disease, unspecified: Secondary | ICD-10-CM | POA: Diagnosis not present

## 2023-05-08 DIAGNOSIS — R0989 Other specified symptoms and signs involving the circulatory and respiratory systems: Secondary | ICD-10-CM | POA: Insufficient documentation

## 2023-05-08 DIAGNOSIS — Z7901 Long term (current) use of anticoagulants: Secondary | ICD-10-CM | POA: Diagnosis not present

## 2023-05-08 DIAGNOSIS — S0990XA Unspecified injury of head, initial encounter: Secondary | ICD-10-CM | POA: Diagnosis present

## 2023-05-08 DIAGNOSIS — Z043 Encounter for examination and observation following other accident: Secondary | ICD-10-CM | POA: Diagnosis not present

## 2023-05-08 DIAGNOSIS — Z9181 History of falling: Secondary | ICD-10-CM | POA: Diagnosis present

## 2023-05-08 DIAGNOSIS — I130 Hypertensive heart and chronic kidney disease with heart failure and stage 1 through stage 4 chronic kidney disease, or unspecified chronic kidney disease: Secondary | ICD-10-CM | POA: Insufficient documentation

## 2023-05-08 DIAGNOSIS — I6789 Other cerebrovascular disease: Secondary | ICD-10-CM | POA: Diagnosis not present

## 2023-05-08 DIAGNOSIS — Y92009 Unspecified place in unspecified non-institutional (private) residence as the place of occurrence of the external cause: Secondary | ICD-10-CM | POA: Insufficient documentation

## 2023-05-08 LAB — URINALYSIS, ROUTINE W REFLEX MICROSCOPIC
Bilirubin Urine: NEGATIVE
Glucose, UA: NEGATIVE mg/dL
Hgb urine dipstick: NEGATIVE
Ketones, ur: NEGATIVE mg/dL
Leukocytes,Ua: NEGATIVE
Nitrite: NEGATIVE
Protein, ur: NEGATIVE mg/dL
Specific Gravity, Urine: 1.019 (ref 1.005–1.030)
pH: 6 (ref 5.0–8.0)

## 2023-05-08 NOTE — ED Provider Notes (Signed)
 [place] EMERGENCY DEPARTMENT AT [facility] Provider Note   CSN: [id number] Arrival date & time: 05/08/23  [id number]     History  Chief Complaint  Patient presents with   [name] is a 89 y.o. adult.  The history is provided by a relative. The history is limited by the condition of the patient (Dementia).  Fall  She has history of hypertension, hyperlipidemia, diastolic heart failure, stroke, chronic kidney disease, atrial fibrillation not on anticoagulation because of frequent falls.  Family member heard something from the room and found her on the floor on her knees.  She denies any injury.  He states that she usually knows to sit on the bed and ask for help when she needs to get out of bed.  He is concerned that she may have a UTI as she gets frequent UTIs and does not obey her usual prohibitions when she gets a UTI.  He states it has been over a year since her last fall.   Home Medications Prior to Admission medications   Medication Sig Start Date End Date Taking? Authorizing Provider  albuterol  (VENTOLIN  HFA) 108 (90 Base) MCG/ACT inhaler Inhale 2 puffs into the lungs every 4 (four) hours as needed for wheezing or shortness of breath. 02/01/23   Duanne Butler DASEN, MD  aspirin  EC 81 MG tablet Take 1 tablet (81 mg total) by mouth daily. 04/03/23   Vann, Jessica U, DO  atenolol  (TENORMIN ) 25 MG tablet TAKE 1/2 TABLET BY MOUTH DAILY 07/27/22   Duanne Butler DASEN, MD  Calcium  Carbonate-Vitamin D3 600-400 MG-UNIT TABS Take 1 tablet by mouth daily. 02/04/20   [provider]  clotrimazole -betamethasone  (LOTRISONE ) cream Apply 1 Application topically daily as needed (fungal infection).    [provider]  DM-APAP-CPM (CORICIDIN HBP PO) Take 1 tablet by mouth every 6 (six) hours as needed (cold symptoms).    [provider]  DULoxetine  (CYMBALTA ) 30 MG capsule Take 1 capsule (30 mg total) by mouth daily. Patient taking differently: Take 30 mg by  mouth daily. Take in addition to the Cymbalta  60 mg to equal a total dose of 90 mg per day. 03/07/23   Duanne Butler DASEN, MD  DULoxetine  (CYMBALTA ) 60 MG capsule Take 1 capsule (60 mg total) by mouth daily. Take in addition to the Cymbalta  30 mg to equal a total dose of 90 mg per day. 03/07/23   Duanne Butler DASEN, MD  ezetimibe  (ZETIA ) 10 MG tablet TAKE 1 TABLET (10 MG TOTAL) BY MOUTH DAILY. 09/19/22   Duanne Butler DASEN, MD  gabapentin  (NEURONTIN ) 800 MG tablet TAKE 1 TABLET BY MOUTH 3 TIMES DAILY 04/19/23   Duanne Butler DASEN, MD  meloxicam  (MOBIC ) 7.5 MG tablet Take 1 tablet (7.5 mg total) by mouth daily. 05/02/23   Duanne Butler DASEN, MD  Naftifine  HCl (NAFTIN ) 2 % CREA Apply 1 Application topically 2 (two) times daily. 04/20/23   Hyatt, Max T, DPM  pantoprazole  (PROTONIX ) 40 MG tablet TAKE 1 TABLET BY MOUTH ONCE DAILY 01/20/23   Duanne Butler DASEN, MD  predniSONE  (DELTASONE ) 20 MG tablet 3 tabs poqday 1-2, 2 tabs poqday 3-4, 1 tab poqday 5-6 05/02/23   Duanne Butler DASEN, MD  rosuvastatin  (CRESTOR ) 40 MG tablet TAKE 1 TABLET BY MOUTH IN THE EVENING 01/20/23   Duanne Butler DASEN, MD  tiZANidine  (ZANAFLEX ) 4 MG tablet Take 1 tablet (4 mg total) by mouth every 6 (six) hours as needed for muscle spasms. 05/02/23  Duanne Butler DASEN, MD      Allergies    Sulfonamide derivatives    Review of Systems   Review of Systems  Unable to perform ROS: Dementia    Physical Exam Updated Vital Signs BP (!) 153/92 (BP Location: Left Arm)   Pulse 82   Temp 98.5 F (36.9 C) (Oral)   Resp 16   Ht 4' 11 (1.499 m)   Wt 75 kg   SpO2 95%   BMI 33.40 kg/m  Physical Exam Vitals and nursing note reviewed.   89 year old female, resting comfortably and in no acute distress. Vital signs are significant for elevated blood pressure. Oxygen saturation is 95%, which is normal. Head is normocephalic and atraumatic. PERRLA, EOMI. Oropharynx is clear. Neck is nontender. Back is nontender. Lungs are clear without rales,  wheezes, or rhonchi. Chest is nontender. Heart has regular rate and rhythm without murmur. Abdomen is soft, flat, nontender. Extremities have no cyanosis or edema, full range of motion is present. Skin is warm and dry without rash. Neurologic: Awake and alert, oriented to person, cranial nerves are intact, moves all extremities equally.  ED Results / Procedures / Treatments   Labs (all labs ordered are listed, but only abnormal results are displayed) Labs Reviewed  URINALYSIS, ROUTINE W REFLEX MICROSCOPIC   Radiology No results found.  Procedures Procedures    Medications Ordered in ED Medications - No data to display  ED Course/ Medical Decision Making/ A&P                                 Medical Decision Making Amount and/or Complexity of Data Reviewed Labs: ordered. Radiology: ordered.   Fall at home without obvious injury.  Possible occult UTI.  Also, son states that she has had chest congestion for several weeks, I have ordered a chest x-ray to rule out pneumonia and a urinalysis to rule out UTI.  I have ordered CT scans of head and cervical spine to look for occult trauma.  I have reviewed her past records, and I note hospitalization 08/25/2020-09/01/2020 for hip fracture following fall.    CT scans showed no evidence of acute injury.  Chest x-ray shows no evidence of pneumonia.  Have independently viewed the images, and agree with radiologist interpretation.  I have reviewed her laboratory tests, and my interpretation is that urinalysis shows no evidence of UTI.  I am discharging her to continue routine care at home..  Final Clinical Impression(s) / ED Diagnoses Final diagnoses:  Fall at home, initial encounter    Rx / DC Orders ED Discharge Orders     None         Raford Lenis, MD 05/08/23 [phone]

## 2023-05-08 NOTE — ED Triage Notes (Signed)
 Pt arrived fom home via [facility] who report were called out by Pts son (POA), who is requesting medical evaluaiton. Pt denies any pain. Pt reports she fell on her buttocks while getting out of bed this morning to use the restroom. Pt denies LOC. Per family, Pt has had previous falls in the past, has Hx of dementia, previous hip fracture and shortening on RLE.

## 2023-05-08 NOTE — ED Notes (Signed)
 Patient transported to CT

## 2023-05-09 ENCOUNTER — Telehealth: Payer: Self-pay

## 2023-05-09 NOTE — Transitions of Care (Post Inpatient/ED Visit) (Signed)
   05/09/2023  Name: [name] MRN: [id number] DOB: 1934-01-15  Today's TOC FU Call Status: Today's TOC FU Call Status:: Unsuccessful Call (1st Attempt) Unsuccessful Call (1st Attempt) Date: 05/09/23  Attempted to reach the patient regarding the most recent Inpatient/ED visit.  Follow Up Plan: Additional outreach attempts will be made to reach the patient to complete the Transitions of Care (Post Inpatient/ED visit) call.   Signature Julian Lemmings, LPN [facility] Nurse Health Advisor Direct Dial [phone]

## 2023-05-10 ENCOUNTER — Ambulatory Visit (INDEPENDENT_AMBULATORY_CARE_PROVIDER_SITE_OTHER): Payer: 59 | Admitting: Family Medicine

## 2023-05-10 ENCOUNTER — Encounter: Payer: Self-pay | Admitting: Family Medicine

## 2023-05-10 VITALS — BP 120/78 | HR 101 | Temp 98.4°F | Ht 59.0 in | Wt 173.0 lb

## 2023-05-10 DIAGNOSIS — R300 Dysuria: Secondary | ICD-10-CM

## 2023-05-10 DIAGNOSIS — N3000 Acute cystitis without hematuria: Secondary | ICD-10-CM | POA: Diagnosis not present

## 2023-05-10 LAB — URINALYSIS, ROUTINE W REFLEX MICROSCOPIC
Bilirubin Urine: NEGATIVE
Glucose, UA: NEGATIVE
Hgb urine dipstick: NEGATIVE
Hyaline Cast: NONE SEEN /[LPF]
Ketones, ur: NEGATIVE
Nitrite: POSITIVE — AB
Protein, ur: NEGATIVE
RBC / HPF: NONE SEEN /[HPF] (ref 0–2)
Specific Gravity, Urine: 1.02 (ref 1.001–1.035)
pH: 7 (ref 5.0–8.0)

## 2023-05-10 LAB — MICROSCOPIC MESSAGE

## 2023-05-10 MED ORDER — CEPHALEXIN 500 MG PO CAPS: 500.0000 mg | ORAL_CAPSULE | Freq: Two times a day (BID) | ORAL | 0 refills | Status: DC

## 2023-05-10 NOTE — Assessment & Plan Note (Signed)
 UA positive. Urine dipstick shows positive for nitrates and positive for leukocytes.  Micro exam: 6-10 WBC's per HPF, moderate+ bacteria, and contaminated specimen. Will treat with Keflex 500mg  BID x7d. Return to office if symptoms persist or worsen.

## 2023-05-10 NOTE — Addendum Note (Signed)
 Addended by: Park Meo on: 05/10/2023 02:31 PM   Modules accepted: Orders

## 2023-05-10 NOTE — Progress Notes (Addendum)
 Subjective:  HPI: [name] is a 89 y.o. adult presenting on 05/10/2023 for Follow-up (Patients son call to schedule appointment for UTI//Symptoms for about 2 days/)   HPI Patient is in today with her son who aids in history for 2 days of acting weird and talking out of her head with burning while urinating per her son. She did have a fall 2 days ago and was evaluated in the ED. At that time her UA was negative as well as head CT and chest x-ray. Her son reports she is complaining of dysuria. Denies fever, chills, body aches, blood in urine, back or abdominal pain, urinary frequency, vaginal bleeding, discharge, or odor, itching.    Review of Systems  All other systems reviewed and are negative.   Relevant past medical history reviewed and updated as indicated.   Past Medical History:  Diagnosis Date  . [name] (abdominal aortic aneurysm) (HCC) 09/2013   3.6 cm  . Acute and chronic respiratory failure with hypoxia (HCC)   . Acute delirium   . Acute encephalopathy   . Acute pyelonephritis   . Atrial fibrillation (HCC)   . Chronic pain syndrome   . Constipation   . Dementia (HCC)   . Dependent edema    bilateral legs   . Depression   . Diabetes mellitus    pre-diabetes  . Diverticulosis   . Diverticulosis   . Dysrhythmia   . Dysuria   . E coli bacteremia   . Fall   . Gross hematuria   . Hydronephrosis with renal and ureteral calculous obstruction   . Hyperlipidemia   . Hypertension   . Hypoxemia   . Idiopathic peripheral autonomic neuropathy   . Infestation by bed bug   . Nephrolithiasis   . OSA on CPAP   . Osteoarthritis   . Osteopenia    T=  -2.1 in hip  . Persistent mood (affective) disorder, unspecified (HCC)   . Prediabetes   . Rheumatoid arthritis (HCC)   . Rheumatoid arthritis(714.0)   . S/P carpal tunnel release   . Sepsis secondary to UTI (HCC)   . Spinal stenosis   . Unspecified fracture of fifth lumbar vertebra, initial encounter for closed  fracture (HCC)   . Unspecified fracture of t11-T12 vertebra, initial encounter for closed fracture (HCC)   . Urinary incontinence   . Urinary tract infection    hx of   . Urolithiasis      Past Surgical History:  Procedure Laterality Date  . APPENDECTOMY    . BIOPSY  02/15/2023   Procedure: BIOPSY;  Surgeon: Shaaron Lamar HERO, MD;  Location: [facility];  Service: Endoscopy;;  . CARPAL TUNNEL RELEASE  12/27/2006   Right subcutaneous ulnar nerve transfer -- Right open carpal tunnel release  . CHOLECYSTECTOMY    . CYSTOSCOPY W/ URETERAL STENT PLACEMENT Right 01/30/2018   Procedure: CYSTOSCOPY WITH STENT REPLACEMENT retrograde pylegram;  Surgeon: Cam Morene ORN, MD;  Location: [facility];  Service: Urology;  Laterality: Right;  . CYSTOSCOPY WITH URETEROSCOPY AND STENT PLACEMENT Right 03/01/2018   Procedure: CYSTOSCPY/RIGHT URETEROSCOPY HOLMIUM LASER LITHOTRIPSY AND STENT EXCHANGE;  Surgeon: Cam Morene ORN, MD;  Location: [facility];  Service: Urology;  Laterality: Right;  . ESOPHAGOGASTRODUODENOSCOPY (EGD) WITH PROPOFOL  N/A 02/15/2023   Procedure: ESOPHAGOGASTRODUODENOSCOPY (EGD) WITH PROPOFOL ;  Surgeon: Shaaron Lamar HERO, MD;  Location: [facility];  Service: Endoscopy;  Laterality: N/A;  2:15 pm, asa 3/4  . FOOT SURGERY    . HIP  ARTHROPLASTY Left 08/27/2020   Procedure: ARTHROPLASTY BIPOLAR HIP (HEMIARTHROPLASTY);  Surgeon: Onesimo Oneil LABOR, MD;  Location: [facility];  Service: Orthopedics;  Laterality: Left;  . HOLMIUM LASER APPLICATION Right 03/01/2018   Procedure: HOLMIUM LASER APPLICATION;  Surgeon: Cam Morene ORN, MD;  Location: [facility];  Service: Urology;  Laterality: Right;  . JOINT REPLACEMENT     BTKR, RTHR  . MALONEY DILATION N/A 02/15/2023   Procedure: AGAPITO DILATION;  Surgeon: Shaaron Lamar HERO, MD;  Location: [facility];  Service: Endoscopy;  Laterality: N/A;  . TOTAL KNEE ARTHROPLASTY  08/21/2007   Right total knee replacement    Allergies and medications reviewed and  updated.   Current Outpatient Medications:  .  albuterol  (VENTOLIN  HFA) 108 (90 Base) MCG/ACT inhaler, Inhale 2 puffs into the lungs every 4 (four) hours as needed for wheezing or shortness of breath., Disp: 8 g, Rfl: 3 .  aspirin  EC 81 MG tablet, Take 1 tablet (81 mg total) by mouth daily., Disp: , Rfl:  .  atenolol  (TENORMIN ) 25 MG tablet, TAKE 1/2 TABLET BY MOUTH DAILY, Disp: 15 tablet, Rfl: 10 .  Calcium  Carbonate-Vitamin D3 600-400 MG-UNIT TABS, Take 1 tablet by mouth daily., Disp: , Rfl:  .  cephALEXin  (KEFLEX ) 500 MG capsule, Take 1 capsule (500 mg total) by mouth 2 (two) times daily., Disp: 14 capsule, Rfl: 0 .  clotrimazole -betamethasone  (LOTRISONE ) cream, Apply 1 Application topically daily as needed (fungal infection)., Disp: , Rfl:  .  DM-APAP-CPM (CORICIDIN HBP PO), Take 1 tablet by mouth every 6 (six) hours as needed (cold symptoms)., Disp: , Rfl:  .  DULoxetine  (CYMBALTA ) 30 MG capsule, Take 1 capsule (30 mg total) by mouth daily. (Patient taking differently: Take 30 mg by mouth daily. Take in addition to the Cymbalta  60 mg to equal a total dose of 90 mg per day.), Disp: 90 capsule, Rfl: 1 .  DULoxetine  (CYMBALTA ) 60 MG capsule, Take 1 capsule (60 mg total) by mouth daily. Take in addition to the Cymbalta  30 mg to equal a total dose of 90 mg per day., Disp: 90 capsule, Rfl: 1 .  ezetimibe  (ZETIA ) 10 MG tablet, TAKE 1 TABLET (10 MG TOTAL) BY MOUTH DAILY., Disp: 90 tablet, Rfl: 3 .  gabapentin  (NEURONTIN ) 800 MG tablet, TAKE 1 TABLET BY MOUTH 3 TIMES DAILY, Disp: 90 tablet, Rfl: 10 .  meloxicam  (MOBIC ) 7.5 MG tablet, Take 1 tablet (7.5 mg total) by mouth daily., Disp: 30 tablet, Rfl: 0 .  Naftifine  HCl (NAFTIN ) 2 % CREA, Apply 1 Application topically 2 (two) times daily., Disp: 60 g, Rfl: 2 .  pantoprazole  (PROTONIX ) 40 MG tablet, TAKE 1 TABLET BY MOUTH ONCE DAILY, Disp: 90 tablet, Rfl: 1 .  predniSONE  (DELTASONE ) 20 MG tablet, 3 tabs poqday 1-2, 2 tabs poqday 3-4, 1 tab poqday 5-6,  Disp: 12 tablet, Rfl: 0 .  rosuvastatin  (CRESTOR ) 40 MG tablet, TAKE 1 TABLET BY MOUTH IN THE EVENING, Disp: 90 tablet, Rfl: 1 .  tiZANidine  (ZANAFLEX ) 4 MG tablet, Take 1 tablet (4 mg total) by mouth every 6 (six) hours as needed for muscle spasms., Disp: 30 tablet, Rfl: 0  Allergies  Allergen Reactions  . Sulfonamide Derivatives Hives and Rash    Objective:   BP 120/78   Pulse (!) 101   Temp 98.4 F (36.9 C) (Oral)   Ht 4' 11 (1.499 m)   Wt 173 lb (78.5 kg)   SpO2 91%   BMI 34.94 kg/m  05/10/2023   10:01 AM 05/08/2023    8:38 AM 05/08/2023    6:30 AM  Vitals with BMI  Height 4' 11    Weight 173 lbs    BMI 34.92    Systolic 120 157 846  Diastolic 78 98 92  Pulse 101 80 82     Physical Exam Vitals and nursing note reviewed.  Constitutional:      Appearance: Normal appearance. He is obese. He is not ill-appearing.  HENT:     Head: Normocephalic and atraumatic.  Cardiovascular:     Rate and Rhythm: Normal rate and regular rhythm.     Pulses: Normal pulses.     Heart sounds: Normal heart sounds.  Pulmonary:     Effort: Pulmonary effort is normal.     Breath sounds: Normal breath sounds.  Abdominal:     General: Bowel sounds are normal. There is no distension.     Palpations: Abdomen is soft.     Tenderness: There is no abdominal tenderness. There is no right CVA tenderness or left CVA tenderness.  Lymphadenopathy:     Cervical: No cervical adenopathy.  Skin:    General: Skin is warm and dry.     Capillary Refill: Capillary refill takes less than 2 seconds.  Neurological:     General: No focal deficit present.     Mental Status: He is alert and oriented to person, place, and time. Mental status is at baseline.  Psychiatric:        Mood and Affect: Mood normal.        Behavior: Behavior normal.        Thought Content: Thought content normal.        Judgment: Judgment normal.    Assessment & Plan:  Acute cystitis without hematuria Assessment & Plan: UA  positive. Urine dipstick shows positive for nitrates and positive for leukocytes.  Micro exam: 6-10 WBC's per HPF, moderate+ bacteria, and contaminated specimen. Will treat with Keflex  500mg  BID x7d. Return to office if symptoms persist or worsen.   Orders: -     Urine Culture  Dysuria -     Urinalysis, Routine w reflex microscopic; Future  Other orders -     Cephalexin ; Take 1 capsule (500 mg total) by mouth 2 (two) times daily.  Dispense: 14 capsule; Refill: 0 -     Microscopic Message     Follow up plan: Return if symptoms worsen or fail to improve.  Jeoffrey GORMAN Barrio, FNP

## 2023-05-11 ENCOUNTER — Telehealth: Payer: Self-pay

## 2023-05-11 ENCOUNTER — Other Ambulatory Visit: Payer: Self-pay | Admitting: Podiatry

## 2023-05-11 LAB — URINE CULTURE
MICRO NUMBER:: 15932654
SPECIMEN QUALITY:: ADEQUATE

## 2023-05-11 MED ORDER — NAFTIFINE HCL 2 % EX CREA: 1.0000 | TOPICAL_CREAM | Freq: Two times a day (BID) | CUTANEOUS | 2 refills | Status: DC

## 2023-05-11 NOTE — Telephone Encounter (Signed)
 Patient's son called and left a message. Dr Al Corpus was going to send in a cream for her, but the pharmacy didn't get anything. Looks like he sent in Naftin 2%, but sent it to mail order - I'll re-send to [facility] -thanks

## 2023-05-12 ENCOUNTER — Other Ambulatory Visit: Payer: Self-pay | Admitting: Podiatry

## 2023-05-12 NOTE — Transitions of Care (Post Inpatient/ED Visit) (Signed)
   05/12/2023  Name: [name] MRN: [id number] DOB: 12/07/1933  Today's TOC FU Call Status: Today's TOC FU Call Status:: Unsuccessful Call (1st Attempt) Unsuccessful Call (1st Attempt) Date: 05/09/23  Attempted to reach the patient regarding the most recent Inpatient/ED visit.  Follow Up Plan: No further outreach attempts will be made at this time. We have been unable to contact the patient. Patient already seen in office Signature Julian Lemmings, LPN [facility] Nurse Health Advisor Direct Dial [phone]

## 2023-06-13 ENCOUNTER — Other Ambulatory Visit: Payer: Self-pay | Admitting: *Deleted

## 2023-06-13 MED ORDER — KETOCONAZOLE 2 % EX CREA: 1.0000 | TOPICAL_CREAM | Freq: Two times a day (BID) | CUTANEOUS | 6 refills | Status: AC

## 2023-06-19 ENCOUNTER — Other Ambulatory Visit: Payer: Self-pay | Admitting: Family Medicine

## 2023-06-19 DIAGNOSIS — R8271 Bacteriuria: Secondary | ICD-10-CM

## 2023-06-20 NOTE — Telephone Encounter (Signed)
Last OV 05/02/23 Requested Prescriptions  Pending Prescriptions Disp Refills   atenolol (TENORMIN) 25 MG tablet [Pharmacy Med Name: ATENOLOL 25 MG TABS 25 Tablet] 15 tablet 10    Sig: TAKE ONE-HALF TABLET BY MOUTH DAILY     Cardiovascular: Beta Blockers 2 Failed - 06/20/2023  9:33 AM      Failed - Valid encounter within last 6 months    Recent Outpatient Visits           1 year ago History of CVA (cerebrovascular accident)   [facility] Donita Brooks, MD   2 years ago Asymptomatic bacteriuria   [facility] Pickard, Priscille Heidelberg, MD   2 years ago Dehydration   [facility] Donita Brooks, MD   2 years ago Atrial fibrillation, chronic (HCC)   [facility] Donita Brooks, MD   2 years ago Acute cystitis without hematuria   [facility] Valentino Nose, NP              Passed - Cr in normal range and within 360 days    Creat  Date Value Ref Range Status  07/01/2022 1.05 (H) 0.60 - 0.95 mg/dL Final   Creatinine, Ser  Date Value Ref Range Status  04/03/2023 0.91 0.44 - 1.00 mg/dL Final   Creatinine, Urine  Date Value Ref Range Status  03/08/2007 135.3 mg/dL Final         Passed - Last BP in normal range    BP Readings from Last 1 Encounters:  05/10/23 120/78         Passed - Last Heart Rate in normal range    Pulse Readings from Last 1 Encounters:  05/10/23 (!) 101

## 2023-07-02 ENCOUNTER — Other Ambulatory Visit: Payer: Self-pay | Admitting: Family Medicine

## 2023-07-04 ENCOUNTER — Ambulatory Visit (INDEPENDENT_AMBULATORY_CARE_PROVIDER_SITE_OTHER): Admitting: Family Medicine

## 2023-07-04 ENCOUNTER — Encounter: Payer: Self-pay | Admitting: Family Medicine

## 2023-07-04 VITALS — BP 120/86 | HR 92 | Temp 97.6°F | Ht 59.0 in | Wt 169.0 lb

## 2023-07-04 DIAGNOSIS — R300 Dysuria: Secondary | ICD-10-CM

## 2023-07-04 DIAGNOSIS — N3000 Acute cystitis without hematuria: Secondary | ICD-10-CM | POA: Diagnosis not present

## 2023-07-04 LAB — URINALYSIS, ROUTINE W REFLEX MICROSCOPIC
Glucose, UA: NEGATIVE
Hgb urine dipstick: NEGATIVE
Hyaline Cast: NONE SEEN /LPF
Ketones, ur: NEGATIVE
Nitrite: NEGATIVE
RBC / HPF: NONE SEEN /HPF (ref 0–2)
Specific Gravity, Urine: 1.025 (ref 1.001–1.035)
pH: 5.5 (ref 5.0–8.0)

## 2023-07-04 LAB — MICROSCOPIC MESSAGE

## 2023-07-04 MED ORDER — CEPHALEXIN 500 MG PO CAPS: 500.0000 mg | ORAL_CAPSULE | Freq: Two times a day (BID) | ORAL | 0 refills | Status: DC

## 2023-07-04 NOTE — Progress Notes (Signed)
 Subjective:  HPI: [name] is a 89 y.o. adult presenting on 07/04/2023 for Acute Visit (UTI)   HPI Patient is in today with her son for 3-4 days reporting "not feeling good" and sleeping longer than normal. Denies fever, chills, body aches, urinary frequency, confusion, AMS. Endorses dysuria. No recent antibiotics.   Review of Systems  All other systems reviewed and are negative.   Relevant past medical history reviewed and updated as indicated.   Past Medical History:  Diagnosis Date   [name] (abdominal aortic aneurysm) (HCC) 09/2013   3.6 cm   Acute and chronic respiratory failure with hypoxia (HCC)    Acute delirium    Acute encephalopathy    Acute pyelonephritis    Atrial fibrillation (HCC)    Chronic pain syndrome    Constipation    Dementia (HCC)    Dependent edema    bilateral legs    Depression    Diabetes mellitus    pre-diabetes   Diverticulosis    Diverticulosis    Dysrhythmia    Dysuria    E coli bacteremia    Fall    Gross hematuria    Hydronephrosis with renal and ureteral calculous obstruction    Hyperlipidemia    Hypertension    Hypoxemia    Idiopathic peripheral autonomic neuropathy    Infestation by bed bug    Nephrolithiasis    OSA on CPAP    Osteoarthritis    Osteopenia    T=  -2.1 in hip   Persistent mood (affective) disorder, unspecified (HCC)    Prediabetes    Rheumatoid arthritis (HCC)    Rheumatoid arthritis(714.0)    S/P carpal tunnel release    Sepsis secondary to UTI [facility])    Spinal stenosis    Unspecified fracture of fifth lumbar vertebra, initial encounter for closed fracture (HCC)    Unspecified fracture of t11-T12 vertebra, initial encounter for closed fracture (HCC)    Urinary incontinence    Urinary tract infection    hx of    Urolithiasis      Past Surgical History:  Procedure Laterality Date   APPENDECTOMY     BIOPSY  02/15/2023   Procedure: BIOPSY;  Surgeon: Corbin Ade, MD;  [facility]: [facility];   Service: Endoscopy;;   CARPAL TUNNEL RELEASE  12/27/2006   Right subcutaneous ulnar nerve transfer -- Right open carpal tunnel release   CHOLECYSTECTOMY     CYSTOSCOPY W/ URETERAL STENT PLACEMENT Right 01/30/2018   Procedure: CYSTOSCOPY WITH STENT REPLACEMENT retrograde pylegram;  Surgeon: Crist Fat, MD;  Location: [facility];  Service: Urology;  Laterality: Right;   CYSTOSCOPY WITH URETEROSCOPY AND STENT PLACEMENT Right 03/01/2018   Procedure: CYSTOSCPY/RIGHT URETEROSCOPY HOLMIUM LASER LITHOTRIPSY AND STENT EXCHANGE;  Surgeon: Crist Fat, MD;  Location: [facility];  Service: Urology;  Laterality: Right;   ESOPHAGOGASTRODUODENOSCOPY (EGD) WITH PROPOFOL N/A 02/15/2023   Procedure: ESOPHAGOGASTRODUODENOSCOPY (EGD) WITH PROPOFOL;  Surgeon: Corbin Ade, MD;  [facility];  Service: Endoscopy;  Laterality: N/A;  2:15 pm, asa 3/4   FOOT SURGERY     HIP ARTHROPLASTY Left 08/27/2020   Procedure: ARTHROPLASTY BIPOLAR HIP (HEMIARTHROPLASTY);  Surgeon: Oliver Barre, MD;  Location: [facility];  Service: Orthopedics;  Laterality: Left;   HOLMIUM LASER APPLICATION Right 03/01/2018   Procedure: HOLMIUM LASER APPLICATION;  Surgeon: Crist Fat, MD;  Location: [facility];  Service: Urology;  Laterality: Right;   JOINT REPLACEMENT     BTKR, RTHR  MALONEY DILATION N/A 02/15/2023   Procedure: Elease Hashimoto DILATION;  Surgeon: Corbin Ade, MD;  [facility];  Service: Endoscopy;  Laterality: N/A;   TOTAL KNEE ARTHROPLASTY  08/21/2007   Right total knee replacement    Allergies and medications reviewed and updated.   Current Outpatient Medications:    albuterol (VENTOLIN HFA) 108 (90 Base) MCG/ACT inhaler, Inhale 2 puffs into the lungs every 4 (four) hours as needed for wheezing or shortness of breath., Disp: 8 g, Rfl: 3   aspirin EC 81 MG tablet, Take 1 tablet (81 mg total) by mouth daily., Disp: , Rfl:    atenolol (TENORMIN) 25 MG tablet, TAKE ONE-HALF TABLET BY MOUTH  DAILY, Disp: 45 tablet, Rfl: 1   Calcium Carbonate-Vitamin D3 600-400 MG-UNIT TABS, Take 1 tablet by mouth daily., Disp: , Rfl:    clotrimazole-betamethasone (LOTRISONE) cream, Apply 1 Application topically daily as needed (fungal infection)., Disp: , Rfl:    DM-APAP-CPM (CORICIDIN HBP PO), Take 1 tablet by mouth every 6 (six) hours as needed (cold symptoms)., Disp: , Rfl:    DULoxetine (CYMBALTA) 30 MG capsule, Take 1 capsule (30 mg total) by mouth daily. (Patient taking differently: Take 30 mg by mouth daily. Take in addition to the Cymbalta 60 mg to equal a total dose of 90 mg per day.), Disp: 90 capsule, Rfl: 1   DULoxetine (CYMBALTA) 60 MG capsule, Take 1 capsule (60 mg total) by mouth daily. Take in addition to the Cymbalta 30 mg to equal a total dose of 90 mg per day., Disp: 90 capsule, Rfl: 1   ezetimibe (ZETIA) 10 MG tablet, TAKE 1 TABLET (10 MG TOTAL) BY MOUTH DAILY., Disp: 90 tablet, Rfl: 3   gabapentin (NEURONTIN) 800 MG tablet, TAKE 1 TABLET BY MOUTH 3 TIMES DAILY, Disp: 90 tablet, Rfl: 10   ketoconazole (NIZORAL) 2 % cream, Apply 1 Application topically 2 (two) times daily., Disp: 15 g, Rfl: 6   meloxicam (MOBIC) 7.5 MG tablet, TAKE 1 TABLET BY MOUTH EVERY DAY, Disp: 30 tablet, Rfl: 0   Naftifine HCl 2 % CREA, APPLY TO AFFECTED AREA TWICE A DAY, Disp: 60 g, Rfl: 2   pantoprazole (PROTONIX) 40 MG tablet, TAKE 1 TABLET BY MOUTH ONCE DAILY, Disp: 90 tablet, Rfl: 1   predniSONE (DELTASONE) 20 MG tablet, 3 tabs poqday 1-2, 2 tabs poqday 3-4, 1 tab poqday 5-6, Disp: 12 tablet, Rfl: 0   rosuvastatin (CRESTOR) 40 MG tablet, TAKE 1 TABLET BY MOUTH IN THE EVENING, Disp: 90 tablet, Rfl: 1   tiZANidine (ZANAFLEX) 4 MG tablet, Take 1 tablet (4 mg total) by mouth every 6 (six) hours as needed for muscle spasms., Disp: 30 tablet, Rfl: 0   cephALEXin (KEFLEX) 500 MG capsule, Take 1 capsule (500 mg total) by mouth 2 (two) times daily., Disp: 14 capsule, Rfl: 0  Allergies  Allergen Reactions    Sulfonamide Derivatives Hives and Rash    Objective:   BP 120/86   Pulse 92   Temp 97.6 F (36.4 C) (Oral)   Ht 4\' 11"  (1.499 m)   Wt 169 lb (76.7 kg)   SpO2 95%   BMI 34.13 kg/m      07/04/2023    3:36 PM 05/10/2023   10:01 AM 05/08/2023    8:38 AM  Vitals with BMI  Height 4\' 11"  4\' 11"    Weight 169 lbs 173 lbs   BMI 34.12 34.92   Systolic 120 120 161  Diastolic 86 78 98  Pulse  92 101 80     Physical Exam Vitals and nursing note reviewed.  Constitutional:      Appearance: Normal appearance. He is normal weight.  Eyes:     Extraocular Movements:     Right eye: Normal extraocular motion and no nystagmus.     Left eye: Normal extraocular motion and no nystagmus.  Cardiovascular:     Rate and Rhythm: Normal rate and regular rhythm.  Abdominal:     General: Bowel sounds are normal. There is no distension.     Palpations: Abdomen is soft.     Tenderness: There is no right CVA tenderness or left CVA tenderness.  Genitourinary:    Comments: Deferred using shared decision making Musculoskeletal:     Cervical back: Normal range of motion and neck supple.  Skin:    General: Skin is warm and dry.  Neurological:     General: No focal deficit present.     Mental Status: He is alert. Mental status is at baseline.  Psychiatric:        Mood and Affect: Mood normal.        Speech: Speech normal.        Behavior: Behavior normal.        Thought Content: Thought content normal.        Cognition and Memory: Cognition and memory normal.        Judgment: Judgment normal.     Assessment & Plan:  Acute cystitis without hematuria Assessment & Plan: UA positive. Urine dipstick shows positive for bili, protein, and positive for leukocytes.  Micro exam: 6-10 WBC's per HPF, moderate+ bacteria, and contaminated specimen. Will treat with Keflex 500mg  BID x7d. Return to office if symptoms persist or worsen.   Orders: -     Cephalexin; Take 1 capsule (500 mg total) by mouth 2 (two)  times daily.  Dispense: 14 capsule; Refill: 0  Dysuria -     Urinalysis, Routine w reflex microscopic -     Urine Culture  Other orders -     Microscopic Message     Follow up plan: Return if symptoms worsen or fail to improve.  Park Meo, FNP

## 2023-07-04 NOTE — Assessment & Plan Note (Signed)
 UA positive. Urine dipstick shows positive for bili, protein, and positive for leukocytes.  Micro exam: 6-10 WBC's per HPF, moderate+ bacteria, and contaminated specimen. Will treat with Keflex 500mg  BID x7d. Return to office if symptoms persist or worsen.

## 2023-07-06 LAB — URINE CULTURE
MICRO NUMBER:: 16159645
SPECIMEN QUALITY:: ADEQUATE

## 2023-07-08 ENCOUNTER — Encounter (HOSPITAL_COMMUNITY): Payer: Self-pay

## 2023-07-08 ENCOUNTER — Emergency Department (HOSPITAL_COMMUNITY)

## 2023-07-08 ENCOUNTER — Observation Stay (HOSPITAL_COMMUNITY)
Admission: EM | Admit: 2023-07-08 | Discharge: 2023-07-10 | Disposition: A | Discharge status: Home Health | Attending: Family Medicine | Admitting: Family Medicine

## 2023-07-08 ENCOUNTER — Other Ambulatory Visit: Payer: Self-pay

## 2023-07-08 DIAGNOSIS — Z96642 Presence of left artificial hip joint: Secondary | ICD-10-CM | POA: Diagnosis not present

## 2023-07-08 DIAGNOSIS — Z7982 Long term (current) use of aspirin: Secondary | ICD-10-CM | POA: Diagnosis not present

## 2023-07-08 DIAGNOSIS — Z79899 Other long term (current) drug therapy: Secondary | ICD-10-CM | POA: Diagnosis not present

## 2023-07-08 DIAGNOSIS — B961 Klebsiella pneumoniae [K. pneumoniae] as the cause of diseases classified elsewhere: Secondary | ICD-10-CM | POA: Diagnosis not present

## 2023-07-08 DIAGNOSIS — Z1152 Encounter for screening for COVID-19: Secondary | ICD-10-CM | POA: Insufficient documentation

## 2023-07-08 DIAGNOSIS — R0902 Hypoxemia: Secondary | ICD-10-CM

## 2023-07-08 DIAGNOSIS — F0390 Unspecified dementia without behavioral disturbance: Secondary | ICD-10-CM | POA: Diagnosis not present

## 2023-07-08 DIAGNOSIS — J101 Influenza due to other identified influenza virus with other respiratory manifestations: Secondary | ICD-10-CM | POA: Diagnosis not present

## 2023-07-08 DIAGNOSIS — I130 Hypertensive heart and chronic kidney disease with heart failure and stage 1 through stage 4 chronic kidney disease, or unspecified chronic kidney disease: Secondary | ICD-10-CM | POA: Diagnosis not present

## 2023-07-08 DIAGNOSIS — E1122 Type 2 diabetes mellitus with diabetic chronic kidney disease: Secondary | ICD-10-CM | POA: Insufficient documentation

## 2023-07-08 DIAGNOSIS — K219 Gastro-esophageal reflux disease without esophagitis: Secondary | ICD-10-CM | POA: Insufficient documentation

## 2023-07-08 DIAGNOSIS — N390 Urinary tract infection, site not specified: Secondary | ICD-10-CM | POA: Diagnosis not present

## 2023-07-08 DIAGNOSIS — D696 Thrombocytopenia, unspecified: Secondary | ICD-10-CM | POA: Diagnosis not present

## 2023-07-08 DIAGNOSIS — G8929 Other chronic pain: Secondary | ICD-10-CM | POA: Insufficient documentation

## 2023-07-08 DIAGNOSIS — M25569 Pain in unspecified knee: Secondary | ICD-10-CM | POA: Insufficient documentation

## 2023-07-08 DIAGNOSIS — M545 Low back pain, unspecified: Secondary | ICD-10-CM | POA: Diagnosis not present

## 2023-07-08 DIAGNOSIS — N1831 Chronic kidney disease, stage 3a: Secondary | ICD-10-CM | POA: Diagnosis present

## 2023-07-08 DIAGNOSIS — I5032 Chronic diastolic (congestive) heart failure: Secondary | ICD-10-CM | POA: Insufficient documentation

## 2023-07-08 DIAGNOSIS — J9601 Acute respiratory failure with hypoxia: Secondary | ICD-10-CM | POA: Diagnosis not present

## 2023-07-08 DIAGNOSIS — E785 Hyperlipidemia, unspecified: Secondary | ICD-10-CM | POA: Insufficient documentation

## 2023-07-08 DIAGNOSIS — I482 Chronic atrial fibrillation, unspecified: Secondary | ICD-10-CM | POA: Diagnosis not present

## 2023-07-08 DIAGNOSIS — I714 Abdominal aortic aneurysm, without rupture, unspecified: Secondary | ICD-10-CM | POA: Diagnosis present

## 2023-07-08 DIAGNOSIS — Z96653 Presence of artificial knee joint, bilateral: Secondary | ICD-10-CM | POA: Insufficient documentation

## 2023-07-08 DIAGNOSIS — I7143 Infrarenal abdominal aortic aneurysm, without rupture: Secondary | ICD-10-CM | POA: Diagnosis not present

## 2023-07-08 DIAGNOSIS — Z8673 Personal history of transient ischemic attack (TIA), and cerebral infarction without residual deficits: Secondary | ICD-10-CM | POA: Diagnosis not present

## 2023-07-08 DIAGNOSIS — R531 Weakness: Principal | ICD-10-CM

## 2023-07-08 DIAGNOSIS — J111 Influenza due to unidentified influenza virus with other respiratory manifestations: Secondary | ICD-10-CM

## 2023-07-08 LAB — CBC WITH DIFFERENTIAL/PLATELET
Abs Immature Granulocytes: 0.04 10*3/uL (ref 0.00–0.07)
Basophils Absolute: 0.1 10*3/uL (ref 0.0–0.1)
Basophils Relative: 1 %
Eosinophils Absolute: 0 10*3/uL (ref 0.0–0.5)
Eosinophils Relative: 1 %
HCT: 42.8 % (ref 36.0–46.0)
Hemoglobin: 13.6 g/dL (ref 12.0–15.0)
Immature Granulocytes: 1 %
Lymphocytes Relative: 13 %
Lymphs Abs: 0.8 10*3/uL (ref 0.7–4.0)
MCH: 28.9 pg (ref 26.0–34.0)
MCHC: 31.8 g/dL (ref 30.0–36.0)
MCV: 91.1 fL (ref 80.0–100.0)
Monocytes Absolute: 0.5 10*3/uL (ref 0.1–1.0)
Monocytes Relative: 8 %
Neutro Abs: 5 10*3/uL (ref 1.7–7.7)
Neutrophils Relative %: 76 %
Platelets: 95 10*3/uL — ABNORMAL LOW (ref 150–400)
RBC: 4.7 MIL/uL (ref 3.87–5.11)
RDW: 14 % (ref 11.5–15.5)
WBC: 6.5 10*3/uL (ref 4.0–10.5)
nRBC: 0 % (ref 0.0–0.2)

## 2023-07-08 LAB — URINALYSIS, W/ REFLEX TO CULTURE (INFECTION SUSPECTED)
Bilirubin Urine: NEGATIVE
Glucose, UA: NEGATIVE mg/dL
Ketones, ur: 5 mg/dL — AB
Leukocytes,Ua: NEGATIVE
Nitrite: NEGATIVE
Protein, ur: 30 mg/dL — AB
Specific Gravity, Urine: 1.017 (ref 1.005–1.030)
pH: 7 (ref 5.0–8.0)

## 2023-07-08 LAB — COMPREHENSIVE METABOLIC PANEL
ALT: 25 U/L (ref 0–44)
AST: 34 U/L (ref 15–41)
Albumin: 3.5 g/dL (ref 3.5–5.0)
Alkaline Phosphatase: 56 U/L (ref 38–126)
Anion gap: 11 (ref 5–15)
BUN: 24 mg/dL — ABNORMAL HIGH (ref 8–23)
CO2: 26 mmol/L (ref 22–32)
Calcium: 8.8 mg/dL — ABNORMAL LOW (ref 8.9–10.3)
Chloride: 97 mmol/L — ABNORMAL LOW (ref 98–111)
Creatinine, Ser: 1.17 mg/dL — ABNORMAL HIGH (ref 0.44–1.00)
GFR, Estimated: 45 mL/min — ABNORMAL LOW (ref >60)
Glucose, Bld: 122 mg/dL — ABNORMAL HIGH (ref 70–99)
Potassium: 4.6 mmol/L (ref 3.5–5.1)
Sodium: 134 mmol/L — ABNORMAL LOW (ref 135–145)
Total Bilirubin: 0.7 mg/dL (ref 0.0–1.2)
Total Protein: 6.8 g/dL (ref 6.5–8.1)

## 2023-07-08 LAB — RESP PANEL BY RT-PCR (RSV, FLU A&B, COVID)  RVPGX2
Influenza A by PCR: POSITIVE — AB
Influenza B by PCR: NEGATIVE
Resp Syncytial Virus by PCR: NEGATIVE
SARS Coronavirus 2 by RT PCR: NEGATIVE

## 2023-07-08 LAB — PROTIME-INR
INR: 1.2 (ref 0.8–1.2)
Prothrombin Time: 15.8 s — ABNORMAL HIGH (ref 11.4–15.2)

## 2023-07-08 LAB — BLOOD GAS, VENOUS
Acid-Base Excess: 1 mmol/L (ref 0.0–2.0)
Bicarbonate: 27.9 mmol/L (ref 20.0–28.0)
O2 Saturation: 36 %
Patient temperature: 37.3
pCO2, Ven: 54 mmHg (ref 44–60)
pH, Ven: 7.33 (ref 7.25–7.43)
pO2, Ven: <31 mmHg — CL (ref 32–45)

## 2023-07-08 LAB — BRAIN NATRIURETIC PEPTIDE: B Natriuretic Peptide: 83 pg/mL (ref 0.0–100.0)

## 2023-07-08 LAB — LACTIC ACID, PLASMA: Lactic Acid, Venous: 1.2 mmol/L (ref 0.5–1.9)

## 2023-07-08 LAB — TROPONIN I (HIGH SENSITIVITY)
Troponin I (High Sensitivity): 14 ng/L (ref <18)
Troponin I (High Sensitivity): 17 ng/L (ref <18)

## 2023-07-08 LAB — APTT: aPTT: 30 s (ref 24–36)

## 2023-07-08 MED ORDER — SODIUM CHLORIDE 0.9 % IV BOLUS
1000.0000 mL | Freq: Once | INTRAVENOUS | Status: AC
  Administered 2023-07-08: 1000 mL via INTRAVENOUS

## 2023-07-08 MED ORDER — GUAIFENESIN-DM 100-10 MG/5ML PO SYRP
5.0000 mL | ORAL_SOLUTION | ORAL | Status: DC | PRN
  Administered 2023-07-10: 5 mL via ORAL
  Filled 2023-07-08: qty 10

## 2023-07-08 MED ORDER — ROSUVASTATIN CALCIUM 20 MG PO TABS
40.0000 mg | ORAL_TABLET | Freq: Every evening | ORAL | Status: DC
  Administered 2023-07-08 – 2023-07-09 (×2): 40 mg via ORAL
  Filled 2023-07-08 (×2): qty 2

## 2023-07-08 MED ORDER — OSELTAMIVIR PHOSPHATE 75 MG PO CAPS
75.0000 mg | ORAL_CAPSULE | Freq: Once | ORAL | Status: AC
  Administered 2023-07-08: 75 mg via ORAL
  Filled 2023-07-08: qty 1

## 2023-07-08 MED ORDER — ENOXAPARIN SODIUM 40 MG/0.4ML IJ SOSY: 40.0000 mg | PREFILLED_SYRINGE | INTRAMUSCULAR | Status: DC

## 2023-07-08 MED ORDER — ASPIRIN 81 MG PO TBEC
81.0000 mg | DELAYED_RELEASE_TABLET | Freq: Every day | ORAL | Status: DC
  Administered 2023-07-09 – 2023-07-10 (×2): 81 mg via ORAL
  Filled 2023-07-08 (×2): qty 1

## 2023-07-08 MED ORDER — OSELTAMIVIR PHOSPHATE 30 MG PO CAPS
30.0000 mg | ORAL_CAPSULE | Freq: Two times a day (BID) | ORAL | Status: DC
Start: 2023-07-08 — End: 2023-07-13
  Administered 2023-07-09 – 2023-07-10 (×3): 30 mg via ORAL
  Filled 2023-07-08 (×5): qty 1

## 2023-07-08 MED ORDER — SODIUM CHLORIDE 0.9 % IV SOLN
1.0000 g | Freq: Once | INTRAVENOUS | Status: AC
  Administered 2023-07-09: 1 g via INTRAVENOUS
  Filled 2023-07-08: qty 10

## 2023-07-08 MED ORDER — OSELTAMIVIR PHOSPHATE 75 MG PO CAPS
75.0000 mg | ORAL_CAPSULE | Freq: Once | ORAL | Status: AC
Start: 2023-07-08 — End: 2023-07-08
  Administered 2023-07-08: 75 mg via ORAL
  Filled 2023-07-08: qty 1

## 2023-07-08 MED ORDER — SODIUM CHLORIDE 0.9 % IV SOLN
1.0000 g | Freq: Once | INTRAVENOUS | Status: AC
  Administered 2023-07-08: 1 g via INTRAVENOUS
  Filled 2023-07-08: qty 10

## 2023-07-08 MED ORDER — EZETIMIBE 10 MG PO TABS
10.0000 mg | ORAL_TABLET | Freq: Every day | ORAL | Status: DC
  Administered 2023-07-08 – 2023-07-10 (×3): 10 mg via ORAL
  Filled 2023-07-08 (×3): qty 1

## 2023-07-08 MED ORDER — ATENOLOL 25 MG PO TABS
12.5000 mg | ORAL_TABLET | Freq: Every day | ORAL | Status: DC
  Administered 2023-07-08 – 2023-07-10 (×3): 12.5 mg via ORAL
  Filled 2023-07-08: qty 0.5
  Filled 2023-07-08: qty 1
  Filled 2023-07-08: qty 0.5

## 2023-07-08 MED ORDER — MELATONIN 5 MG PO TABS
5.0000 mg | ORAL_TABLET | Freq: Once | ORAL | Status: AC
  Administered 2023-07-08: 5 mg via ORAL
  Filled 2023-07-08: qty 1

## 2023-07-08 MED ORDER — IOHEXOL 350 MG/ML SOLN
75.0000 mL | Freq: Once | INTRAVENOUS | Status: AC | PRN
  Administered 2023-07-08: 75 mL via INTRAVENOUS

## 2023-07-08 MED ORDER — IPRATROPIUM-ALBUTEROL 0.5-2.5 (3) MG/3ML IN SOLN
3.0000 mL | RESPIRATORY_TRACT | Status: AC
  Administered 2023-07-08 – 2023-07-09 (×5): 3 mL via RESPIRATORY_TRACT
  Filled 2023-07-08 (×6): qty 3

## 2023-07-08 MED ORDER — ACETAMINOPHEN 325 MG PO TABS
650.0000 mg | ORAL_TABLET | Freq: Once | ORAL | Status: AC
  Administered 2023-07-08: 650 mg via ORAL
  Filled 2023-07-08: qty 2

## 2023-07-08 MED ORDER — GABAPENTIN 400 MG PO CAPS
400.0000 mg | ORAL_CAPSULE | Freq: Three times a day (TID) | ORAL | Status: DC
  Administered 2023-07-08 – 2023-07-10 (×5): 400 mg via ORAL
  Filled 2023-07-08: qty 4
  Filled 2023-07-08 (×4): qty 1

## 2023-07-08 MED ORDER — ACETAMINOPHEN 325 MG PO TABS
650.0000 mg | ORAL_TABLET | Freq: Four times a day (QID) | ORAL | Status: DC | PRN
  Administered 2023-07-08 – 2023-07-09 (×2): 650 mg via ORAL
  Filled 2023-07-08 (×2): qty 2

## 2023-07-08 MED ORDER — ACETAMINOPHEN 650 MG RE SUPP: 650.0000 mg | Freq: Four times a day (QID) | RECTAL | Status: DC | PRN

## 2023-07-08 MED ORDER — PANTOPRAZOLE SODIUM 40 MG PO TBEC
40.0000 mg | DELAYED_RELEASE_TABLET | Freq: Every day | ORAL | Status: DC
  Administered 2023-07-08 – 2023-07-10 (×3): 40 mg via ORAL
  Filled 2023-07-08 (×3): qty 1

## 2023-07-08 MED ORDER — DULOXETINE HCL 60 MG PO CPEP
60.0000 mg | ORAL_CAPSULE | Freq: Every day | ORAL | Status: DC
Start: 2023-07-08 — End: 2023-07-10
  Administered 2023-07-08 – 2023-07-10 (×3): 60 mg via ORAL
  Filled 2023-07-08 (×2): qty 1
  Filled 2023-07-08: qty 2

## 2023-07-08 MED ORDER — DICLOFENAC SODIUM 1 % EX GEL
4.0000 g | Freq: Four times a day (QID) | CUTANEOUS | Status: DC
  Administered 2023-07-09 – 2023-07-10 (×5): 4 g via TOPICAL
  Filled 2023-07-08 (×2): qty 100

## 2023-07-08 MED ORDER — IOHEXOL 300 MG/ML  SOLN
80.0000 mL | Freq: Once | INTRAMUSCULAR | Status: AC | PRN
  Administered 2023-07-08: 80 mL via INTRAVENOUS

## 2023-07-08 NOTE — ED Notes (Signed)
 Patient via [facility] from [facility] for Increased weakness x3 days, recent eval for uti abx no improvement, re-eval. Vascular to see for an increase in known [name], swabbed flu A positive.   Afib en route, known hx.  Alert and oriented.

## 2023-07-08 NOTE — ED Provider Notes (Signed)
 [place] EMERGENCY DEPARTMENT AT [facility] Provider Note   CSN: [id number] Arrival date & time: 07/08/23  6962     History  No chief complaint on file.   [name] is a 89 y.o. adult.  She is brought in by ambulance from home.  General weakness, urinary symptoms.  Saw PCP few days ago and put on antibiotics Keflex for possible UTI.  Antibiotics grew out Klebsiella.  Her main complaint is she has pain all over although she does endorse dysuria.  She is a very poor historian.  The history is provided by the patient and the EMS personnel.  Weakness Severity:  Severe Onset quality:  Gradual Duration:  4 days Timing:  Constant Progression:  Worsening Chronicity:  New Context: urinary tract infection   Relieved by:  Nothing Associated symptoms: difficulty walking and dysuria        Home Medications Prior to Admission medications   Medication Sig Start Date End Date Taking? Authorizing Provider  albuterol (VENTOLIN HFA) 108 (90 Base) MCG/ACT inhaler Inhale 2 puffs into the lungs every 4 (four) hours as needed for wheezing or shortness of breath. 02/01/23   Donita Brooks, MD  aspirin EC 81 MG tablet Take 1 tablet (81 mg total) by mouth daily. 04/03/23   Joseph Art, DO  atenolol (TENORMIN) 25 MG tablet TAKE ONE-HALF TABLET BY MOUTH DAILY 06/20/23   Donita Brooks, MD  Calcium Carbonate-Vitamin D3 600-400 MG-UNIT TABS Take 1 tablet by mouth daily. 02/04/20   [provider]  cephALEXin (KEFLEX) 500 MG capsule Take 1 capsule (500 mg total) by mouth 2 (two) times daily. 07/04/23   Park Meo, FNP  clotrimazole-betamethasone (LOTRISONE) cream Apply 1 Application topically daily as needed (fungal infection).    [provider]  DM-APAP-CPM (CORICIDIN HBP PO) Take 1 tablet by mouth every 6 (six) hours as needed (cold symptoms).    [provider]  DULoxetine (CYMBALTA) 30 MG capsule Take 1 capsule (30 mg total) by mouth daily. Patient  taking differently: Take 30 mg by mouth daily. Take in addition to the Cymbalta 60 mg to equal a total dose of 90 mg per day. 03/07/23   Donita Brooks, MD  DULoxetine (CYMBALTA) 60 MG capsule Take 1 capsule (60 mg total) by mouth daily. Take in addition to the Cymbalta 30 mg to equal a total dose of 90 mg per day. 03/07/23   Donita Brooks, MD  ezetimibe (ZETIA) 10 MG tablet TAKE 1 TABLET (10 MG TOTAL) BY MOUTH DAILY. 09/19/22   Donita Brooks, MD  gabapentin (NEURONTIN) 800 MG tablet TAKE 1 TABLET BY MOUTH 3 TIMES DAILY 04/19/23   Donita Brooks, MD  ketoconazole (NIZORAL) 2 % cream Apply 1 Application topically 2 (two) times daily. 06/13/23   Hyatt, Max T, DPM  meloxicam (MOBIC) 7.5 MG tablet TAKE 1 TABLET BY MOUTH EVERY DAY 07/03/23   Donita Brooks, MD  Naftifine HCl 2 % CREA APPLY TO AFFECTED AREA TWICE A DAY 05/12/23   Standiford, Jenelle Mages, DPM  pantoprazole (PROTONIX) 40 MG tablet TAKE 1 TABLET BY MOUTH ONCE DAILY 01/20/23   Donita Brooks, MD  predniSONE (DELTASONE) 20 MG tablet 3 tabs poqday 1-2, 2 tabs poqday 3-4, 1 tab poqday 5-6 05/02/23   Donita Brooks, MD  rosuvastatin (CRESTOR) 40 MG tablet TAKE 1 TABLET BY MOUTH IN THE EVENING 01/20/23   Donita Brooks, MD  tiZANidine (ZANAFLEX) 4 MG tablet Take  1 tablet (4 mg total) by mouth every 6 (six) hours as needed for muscle spasms. 05/02/23   Donita Brooks, MD      Allergies    Sulfonamide derivatives    Review of Systems   Review of Systems  Genitourinary:  Positive for dysuria.  Neurological:  Positive for weakness.    Physical Exam Updated Vital Signs BP (!) 107/57 (BP Location: Right Arm)   Pulse 94   Temp 98.2 F (36.8 C) (Oral)   Resp 20   Ht 4\' 11"  (1.499 m)   Wt 76.6 kg   SpO2 100%   BMI 34.11 kg/m  Physical Exam Vitals and nursing note reviewed.  Constitutional:      General: He is in acute distress.     Appearance: Normal appearance. He is well-developed. He is obese.  HENT:     Head:  Normocephalic and atraumatic.  Eyes:     Conjunctiva/sclera: Conjunctivae normal.  Cardiovascular:     Rate and Rhythm: Regular rhythm. Tachycardia present.  Pulmonary:     Effort: Pulmonary effort is normal.     Breath sounds: Rhonchi present.  Abdominal:     Tenderness: There is no abdominal tenderness. There is no guarding or rebound.  Musculoskeletal:        General: No deformity.     Cervical back: Neck supple.  Skin:    General: Skin is warm and dry.  Neurological:     General: No focal deficit present.     Mental Status: He is alert. He is disoriented.     GCS: GCS eye subscore is 4. GCS verbal subscore is 5. GCS motor subscore is 6.     Motor: No weakness.     ED Results / Procedures / Treatments   Labs (all labs ordered are listed, but only abnormal results are displayed) Labs Reviewed  RESP PANEL BY RT-PCR (RSV, FLU A&B, COVID)  RVPGX2 - Abnormal; Notable for the following components:      Result Value   Influenza A by PCR POSITIVE (*)    All other components within normal limits  COMPREHENSIVE METABOLIC PANEL - Abnormal; Notable for the following components:   Sodium 134 (*)    Chloride 97 (*)    Glucose, Bld 122 (*)    BUN 24 (*)    Creatinine, Ser 1.17 (*)    Calcium 8.8 (*)    GFR, Estimated 45 (*)    All other components within normal limits  CBC WITH DIFFERENTIAL/PLATELET - Abnormal; Notable for the following components:   Platelets 95 (*)    All other components within normal limits  PROTIME-INR - Abnormal; Notable for the following components:   Prothrombin Time 15.8 (*)    All other components within normal limits  BLOOD GAS, VENOUS - Abnormal; Notable for the following components:   pO2, Ven <31 (*)    All other components within normal limits  URINALYSIS, W/ REFLEX TO CULTURE (INFECTION SUSPECTED) - Abnormal; Notable for the following components:   APPearance HAZY (*)    Hgb urine dipstick SMALL (*)    Ketones, ur 5 (*)    Protein, ur 30 (*)     Bacteria, UA RARE (*)    All other components within normal limits  CULTURE, BLOOD (ROUTINE X 2)  CULTURE, BLOOD (ROUTINE X 2)  LACTIC ACID, PLASMA  APTT  BRAIN NATRIURETIC PEPTIDE  TROPONIN I (HIGH SENSITIVITY)  TROPONIN I (HIGH SENSITIVITY)    EKG EKG Interpretation Date/Time:  Saturday July 08 2023 07:25:56 EST Ventricular Rate:  127 PR Interval:    QRS Duration:  79 QT Interval:  290 QTC Calculation: 422 R Axis:   54  Text Interpretation: Atrial fibrillation Low voltage, precordial leads rate increased from prior 10/24 Confirmed by Meridee Score [phone]) on 07/08/2023 8:20:01 AM  Radiology CT Angio Abd/Pel W and/or Wo Contrast Result Date: 07/08/2023 CLINICAL DATA:  Enlarging infrarenal abdominal aortic aneurysm on routine CT from earlier today. EXAM: CTA ABDOMEN AND PELVIS WITHOUT AND WITH CONTRAST TECHNIQUE: Multidetector CT imaging of the abdomen and pelvis was performed using the standard protocol during bolus administration of intravenous contrast. Multiplanar reconstructed images and MIPs were obtained and reviewed to evaluate the vascular anatomy. RADIATION DOSE REDUCTION: This exam was performed according to the departmental dose-optimization program which includes automated exposure control, adjustment of the mA and/or kV according to patient size and/or use of iterative reconstruction technique. CONTRAST:  75mL OMNIPAQUE IOHEXOL 350 MG/ML SOLN COMPARISON:  Three hundred twenty-five CT abdomen/pelvis FINDINGS: VASCULAR Aorta: Tortuous atherosclerotic abdominal aorta with fusiform infrarenal abdominal aortic aneurysm measuring 4.4 cm AP x 5.4 cm TRANSVERSE maximum diameter on sagittal series 10/image 127, previously 4.2 cm AP x 5.0 cm TRANSVERSE maximum diameter on 02/13/2021 CT abdomen/pelvis study using similar measurement technique, minimally increased. Moderate eccentric mural plaque within the aneurysm. No acute dissection. No acute surrounding fat stranding. No acute  penetrating atherosclerotic ulcer. Celiac: Approximately 60-70% ostial stenosis. SMA: Near occlusion proximally, likely greater than 90% stenosis. Renals: Moderate proximal stenoses in the bilateral renal arteries. IMA: Multifocal mild-to-moderate stenoses. Inflow: No significant stenosis.  No dissection or aneurysm. Proximal Outflow: Bilateral common femoral and visualized portions of the superficial and profunda femoral arteries are patent without evidence of aneurysm, dissection, vasculitis or significant stenosis. Veins: No acute abnormality on this arterial phase scan. Review of the MIP images confirms the above findings. NON-VASCULAR Lower chest: Coronary atherosclerosis. No acute abnormality at the lung bases. Hepatobiliary: Mildly lobulated liver surface, cannot exclude cirrhosis. No liver masses. Cholecystectomy. Bile ducts are stable and within normal post cholecystectomy limits with CBD diameter 8 mm. Pancreas: Normal, with no mass or duct dilation. Spleen: Normal size. No mass. Adrenals/Urinary Tract: Normal adrenals. Simple 2.1 cm lower right renal cyst, for which no follow-up imaging is recommended. No hydronephrosis. Normal bladder. Stomach/Bowel: Normal non-distended stomach. Normal caliber small bowel with no small bowel wall thickening. Appendix not discretely visualized. Marked left colonic diverticulosis with no large bowel wall thickening or significant pericolonic fat stranding. Vascular/Lymphatic: No pathologically enlarged lymph nodes in the abdomen or pelvis. Reproductive: Status post hysterectomy, with no abnormal findings at the vaginal cuff. No adnexal mass. Other: No pneumoperitoneum, ascites or focal fluid collection. Musculoskeletal: No aggressive appearing focal osseous lesions. Left total hip arthroplasty. Chronic severe T12 and moderate L5 vertebral compression fractures. Marked thoracolumbar spondylosis. Diffuse osteopenia. IMPRESSION: 1. Tortuous atherosclerotic abdominal aorta  with fusiform infrarenal abdominal aortic aneurysm measuring 4.4 cm AP x 5.4 cm transverse maximum diameter, minimally increased from 4.2 x 5.0 cm since 02/13/2021 CT abdomen/pelvis study using similar measurement technique. Recommend follow-up CT or MR as appropriate in 6 months and referral to or continued care with vascular specialist. (Ref.: J Vasc Surg. 2018; 67:2-77 and J Am Coll Radiol 2013;10(10):789-794.) 2. 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 3. 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 4. Moderate proximal stenoses in the bilateral renal arteries. 5. Multifocal mild-to-moderate stenoses in the IMA. 6. Marked left colonic  diverticulosis. 7. Mildly lobulated liver surface, cannot exclude cirrhosis. No liver masses. 8. Chronic severe T12 and moderate L5 vertebral compression fractures. Diffuse osteopenia. Electronically Signed   By: Delbert Phenix M.D.   On: 07/08/2023 11:58   CT ABDOMEN PELVIS W CONTRAST Result Date: 07/08/2023 CLINICAL DATA:  Recent diagnosis of UTI. On antibiotics but having pain with urination. EXAM: CT ABDOMEN AND PELVIS WITH CONTRAST TECHNIQUE: Multidetector CT imaging of the abdomen and pelvis was performed using the standard protocol following bolus administration of intravenous contrast. RADIATION DOSE REDUCTION: This exam was performed according to the departmental dose-optimization program which includes automated exposure control, adjustment of the mA and/or kV according to patient size and/or use of iterative reconstruction technique. CONTRAST:  80mL OMNIPAQUE IOHEXOL 300 MG/ML  SOLN COMPARISON:  Abdomen pelvis CT 02/13/2021. FINDINGS: Lower chest: Heart is enlarged. Coronary artery calcifications are seen. Small hiatal hernia. Breathing motion at the bases. Hepatobiliary: No focal liver abnormality is seen. No gallstones, gallbladder wall thickening, or biliary dilatation. Pancreas: Unremarkable. No pancreatic ductal dilatation  or surrounding inflammatory changes. Spleen: Normal in size without focal abnormality. Adrenals/Urinary Tract: The adrenal glands are preserved. Mild renal atrophy. No collecting system dilatation. Preserved parenchymal enhancement. The ureters have normal course and caliber extending down to the urinary bladder. Bladder is underdistended. Bosniak 1 lower pole right-sided renal cyst identified measuring 2.6 cm with Hounsfield unit 5. No specific follow-up. Few parapelvic left-sided renal cysts also suggested. Punctate nonobstructing lower pole renal stones. Stomach/Bowel: Stomach is mildly distended with air. Small bowel has a normal course and caliber. Large bowel is normal course and caliber with scattered colonic stool. Extensive left-sided colonic diverticulosis including the descending and sigmoid colon. The appendix is not well seen in the right lower quadrant but no pericecal stranding or fluid. Vascular/Lymphatic: Extensive vascular calcifications identified. Once again there is a abdominal aortic aneurysm with significant mural plaque and thrombus. Dimension today on series 2, image 26 measures 6.0 by 4.4 cm. On the prior when measured in same fashion is today this would have measured 5.1 x 4.3 cm, increasing in size. Significant calcified plaque along branch vessels including celiac and SMA. The SMA is occluded proximally. There is significant stenosis along the celiac. The IMA as calcified plaque and is slightly enlarged compared to the expected. Is also significant calcified plaque along the renal artery origins. Please correlate for level of hypertension. Normal caliber IVC. No specific abnormal lymph node enlargement identified in the abdomen and pelvis. Reproductive: Status post hysterectomy. No adnexal masses. Other: No free intra-air or free fluid. Musculoskeletal: Streak artifact from left hip arthroplasty. Diffuse osteopenia. Moderate degenerative changes seen along the spine and pelvis.  Multilevel stenosis. Also multilevel compression deformities along the spine these are similar to previous examination cleaning severe at T12 and mild at L5. IMPRESSION: Preserved renal enhancement. No inflammatory changes. Benign renal cysts. Punctate nonobstructing renal stones. No collecting system dilatation. Sigmoid colon and descending colon diverticulosis without obstruction. Enlarging infrarenal abdominal aortic aneurysm now measuring up to 6.0 x 4.4 cm. Recommend vascular surgery consultation. In addition there are several areas of significant calcified plaque along branch vessels. There is occlusion of the SMA. Significant stenosis of the celiac, renal arteries. Ectatic inferior mesenteric artery. Enlarged heart.  Coronary artery calcifications. Stable compression deformities along the spine.  Severe osteopenia Motion Electronically Signed   By: Karen Kays M.D.   On: 07/08/2023 10:31   DG Chest Port 1 View Result Date: 07/08/2023 CLINICAL DATA:  Questionable sepsis -  evaluate for abnormality EXAM: PORTABLE CHEST 1 VIEW COMPARISON:  May 08, 2023 FINDINGS: The cardiomediastinal silhouette is unchanged and enlarged in contour.Tortuous thoracic aorta. Atherosclerotic calcifications. No pleural effusion. No pneumothorax. No acute pleuroparenchymal abnormality. IMPRESSION: No acute cardiopulmonary abnormality. Electronically Signed   By: Meda Klinefelter M.D.   On: 07/08/2023 08:48    Procedures .Critical Care  Performed by: Terrilee Files, MD Authorized by: Terrilee Files, MD   Critical care provider statement:    Critical care time (minutes):  30   Critical care was necessary to treat or prevent imminent or life-threatening deterioration of the following conditions:  Circulatory failure   Critical care was time spent personally by me on the following activities:  Development of treatment plan with patient or surrogate, discussions with consultants, evaluation of patient's response to  treatment, examination of patient, ordering and review of laboratory studies, ordering and review of radiographic studies, ordering and performing treatments and interventions, pulse oximetry, re-evaluation of patient's condition and review of old charts     Medications Ordered in ED Medications  sodium chloride 0.9 % bolus 1,000 mL (0 mLs Intravenous Stopped 07/08/23 1228)  cefTRIAXone (ROCEPHIN) 1 g in sodium chloride 0.9 % 100 mL IVPB (0 g Intravenous Stopped 07/08/23 1228)  acetaminophen (TYLENOL) tablet 650 mg (650 mg Oral Given 07/08/23 1024)  iohexol (OMNIPAQUE) 300 MG/ML solution 80 mL (80 mLs Intravenous Contrast Given 07/08/23 1004)  iohexol (OMNIPAQUE) 350 MG/ML injection 75 mL (75 mLs Intravenous Contrast Given 07/08/23 1108)  oseltamivir (TAMIFLU) capsule 75 mg (75 mg Oral Given 07/08/23 1224)    ED Course/ Medical Decision Making/ A&P Clinical Course as of 07/08/23 1723  Sat Jul 08, 2023  0839 Chest x-ray interpreted by me as no acute infiltrate.  Awaiting radiology reading. [MB]  0915 Lab significant for new low platelets of 95. [MB]  1048 Discussed with Dr. Sherral Hammers vascular surgery.  He is asking that the patient get a CT angio.  He will review her imaging and get back to me with recommendations [MB]  1150 Updated patient and her son regarding plan for transfer to [facility].  They are in agreement. [MB]    Clinical Course User Index [MB] Terrilee Files, MD                                 Medical Decision Making Amount and/or Complexity of Data Reviewed Labs: ordered. Radiology: ordered.  Risk OTC drugs. Prescription drug management.   This patient complains of abdominal pain, dysuria, general weakness; this involves an extensive number of treatment Options and is a complaint that carries with it a high risk of complications and morbidity. The differential includes UTI, pyelonephritis, obstruction, perforation, vascular  I ordered, reviewed and interpreted labs, which  included CBC with normal white count, new low platelets, chemistries with elevated glucose elevated BUN and creatinine, urinalysis without clear signs of infection, VBG without acidosis, flu positive, blood culture sent, troponins flat I ordered medication IV fluids IV antibiotics, oral Tamiflu and reviewed PMP when indicated. I ordered imaging studies which included chest x-ray, CT abdomen and pelvis, CT angio abdomen and pelvis and I independently    visualized and interpreted imaging which showed enlarged aorta, worsening celiac disease Additional history obtained from patient's son and EMS Previous records obtained and reviewed in epic including recent PCP notes I consulted vascular Dr. Karin Lieu and discussed lab and imaging findings and discussed disposition.  Cardiac monitoring reviewed, sinus rhythm Social determinants considered, patient with social isolation medical illiteracy, decreased physical activity Critical Interventions: Initiation of angio for concern for worsening [name] possible celiac occlusion  After the interventions stated above, I reevaluated the patient and found patient still to be with increased work of breathing and feeling unwell Admission and further testing considered, transferring to [facility] for vascular evaluation and likely medical admission.         Final Clinical Impression(s) / ED Diagnoses Final diagnoses:  Generalized weakness  Thrombocytopenia (HCC)  Influenza  Infrarenal abdominal aortic aneurysm ([name]) without rupture [facility])    Rx / DC Orders ED Discharge Orders     None         Terrilee Files, MD 07/08/23 1727

## 2023-07-08 NOTE — ED Triage Notes (Signed)
 Pt c/o increasing weakness x3 days. Dx'd with a UTI and put on abx but says she's feeling no relief. Took morning meds today. Still having painful urination per pt. Hx of HTN and a.fib. CBG 120 per EMS

## 2023-07-08 NOTE — ED Notes (Signed)
 Admitting provider at bedside.

## 2023-07-08 NOTE — ED Notes (Signed)
 Paged Dr. Sherral Hammers at this time. [place]

## 2023-07-08 NOTE — Consult Note (Signed)
 Hospital Consult    Reason for Consult: [name] with slight increase in size. Requesting Physician: ED MRN #:  [id number]  History of Present Illness: This is a 89 y.o. female who initially presented to [facility] with a several day history of failure to thrive, malaise, fever.  Workup demonstrated flu positive, UTI.  Due to some 9 described symptoms, and known [name], the abdomen was scanned.  This demonstrated slight increase in the pararenal abdominal aortic aneurysm.  Being that there was some question regarding abdominal pain, and also some question regarding SMA stenosis versus occlusion, I asked that she be transferred to [facility] for further evaluation.  On exam, Anayeli was resting comfortably, accompanied by her son.  She noted a several day history of malaise. She denies significant abdominal pain, back pain, chest pain. While she has not been hungry over the last few days, prior to that, she has had no previous issues of food fear, postprandial pain.  Her son says that she loves to eat.   Past Medical History:  Diagnosis Date   [name] (abdominal aortic aneurysm) (HCC) 09/2013   3.6 cm   Acute and chronic respiratory failure with hypoxia (HCC)    Acute delirium    Acute encephalopathy    Acute pyelonephritis    Atrial fibrillation (HCC)    Chronic pain syndrome    Constipation    Dementia (HCC)    Dependent edema    bilateral legs    Depression    Diabetes mellitus    pre-diabetes   Diverticulosis    Diverticulosis    Dysrhythmia    Dysuria    E coli bacteremia    Fall    Gross hematuria    Hydronephrosis with renal and ureteral calculous obstruction    Hyperlipidemia    Hypertension    Hypoxemia    Idiopathic peripheral autonomic neuropathy    Infestation by bed bug    Nephrolithiasis    OSA on CPAP    Osteoarthritis    Osteopenia    T=  -2.1 in hip   Persistent mood (affective) disorder, unspecified (HCC)    Prediabetes    Rheumatoid arthritis (HCC)     Rheumatoid arthritis(714.0)    S/P carpal tunnel release    Sepsis secondary to UTI [facility])    Spinal stenosis    Unspecified fracture of fifth lumbar vertebra, initial encounter for closed fracture (HCC)    Unspecified fracture of t11-T12 vertebra, initial encounter for closed fracture (HCC)    Urinary incontinence    Urinary tract infection    hx of    Urolithiasis     Past Surgical History:  Procedure Laterality Date   APPENDECTOMY     BIOPSY  02/15/2023   Procedure: BIOPSY;  Surgeon: Corbin Ade, MD;  Location: [facility];  Service: Endoscopy;;   CARPAL TUNNEL RELEASE  12/27/2006   Right subcutaneous ulnar nerve transfer -- Right open carpal tunnel release   CHOLECYSTECTOMY     CYSTOSCOPY W/ URETERAL STENT PLACEMENT Right 01/30/2018   Procedure: CYSTOSCOPY WITH STENT REPLACEMENT retrograde pylegram;  Surgeon: Crist Fat, MD;  Location: [facility];  Service: Urology;  Laterality: Right;   CYSTOSCOPY WITH URETEROSCOPY AND STENT PLACEMENT Right 03/01/2018   Procedure: CYSTOSCPY/RIGHT URETEROSCOPY HOLMIUM LASER LITHOTRIPSY AND STENT EXCHANGE;  Surgeon: Crist Fat, MD;  Location: [facility];  Service: Urology;  Laterality: Right;   ESOPHAGOGASTRODUODENOSCOPY (EGD) WITH PROPOFOL N/A 02/15/2023   Procedure: ESOPHAGOGASTRODUODENOSCOPY (EGD) WITH PROPOFOL;  Surgeon: Corbin Ade, MD;  Location: [facility];  Service: Endoscopy;  Laterality: N/A;  2:15 pm, asa 3/4   FOOT SURGERY     HIP ARTHROPLASTY Left 08/27/2020   Procedure: ARTHROPLASTY BIPOLAR HIP (HEMIARTHROPLASTY);  Surgeon: Oliver Barre, MD;  Location: [facility];  Service: Orthopedics;  Laterality: Left;   HOLMIUM LASER APPLICATION Right 03/01/2018   Procedure: HOLMIUM LASER APPLICATION;  Surgeon: Crist Fat, MD;  Location: [facility];  Service: Urology;  Laterality: Right;   JOINT REPLACEMENT     BTKR, RTHR   MALONEY DILATION N/A 02/15/2023   Procedure: MALONEY DILATION;  Surgeon: Corbin Ade, MD;   Location: [facility];  Service: Endoscopy;  Laterality: N/A;   TOTAL KNEE ARTHROPLASTY  08/21/2007   Right total knee replacement    Allergies  Allergen Reactions   Sulfonamide Derivatives Hives and Rash    Prior to Admission medications   Medication Sig Start Date End Date Taking? Authorizing Provider  albuterol (VENTOLIN HFA) 108 (90 Base) MCG/ACT inhaler Inhale 2 puffs into the lungs every 4 (four) hours as needed for wheezing or shortness of breath. 02/01/23   Donita Brooks, MD  aspirin EC 81 MG tablet Take 1 tablet (81 mg total) by mouth daily. 04/03/23   Joseph Art, DO  atenolol (TENORMIN) 25 MG tablet TAKE ONE-HALF TABLET BY MOUTH DAILY 06/20/23   Donita Brooks, MD  Calcium Carbonate-Vitamin D3 600-400 MG-UNIT TABS Take 1 tablet by mouth daily. 02/04/20   [provider]  cephALEXin (KEFLEX) 500 MG capsule Take 1 capsule (500 mg total) by mouth 2 (two) times daily. 07/04/23   Park Meo, FNP  clotrimazole-betamethasone (LOTRISONE) cream Apply 1 Application topically daily as needed (fungal infection).    [provider]  DM-APAP-CPM (CORICIDIN HBP PO) Take 1 tablet by mouth every 6 (six) hours as needed (cold symptoms).    [provider]  DULoxetine (CYMBALTA) 30 MG capsule Take 1 capsule (30 mg total) by mouth daily. Patient taking differently: Take 30 mg by mouth daily. Take in addition to the Cymbalta 60 mg to equal a total dose of 90 mg per day. 03/07/23   Donita Brooks, MD  DULoxetine (CYMBALTA) 60 MG capsule Take 1 capsule (60 mg total) by mouth daily. Take in addition to the Cymbalta 30 mg to equal a total dose of 90 mg per day. 03/07/23   Donita Brooks, MD  ezetimibe (ZETIA) 10 MG tablet TAKE 1 TABLET (10 MG TOTAL) BY MOUTH DAILY. 09/19/22   Donita Brooks, MD  gabapentin (NEURONTIN) 800 MG tablet TAKE 1 TABLET BY MOUTH 3 TIMES DAILY 04/19/23   Donita Brooks, MD  ketoconazole (NIZORAL) 2 % cream Apply 1 Application  topically 2 (two) times daily. 06/13/23   Hyatt, Max T, DPM  meloxicam (MOBIC) 7.5 MG tablet TAKE 1 TABLET BY MOUTH EVERY DAY 07/03/23   Donita Brooks, MD  Naftifine HCl 2 % CREA APPLY TO AFFECTED AREA TWICE A DAY 05/12/23   Standiford, Jenelle Mages, DPM  pantoprazole (PROTONIX) 40 MG tablet TAKE 1 TABLET BY MOUTH ONCE DAILY 01/20/23   Donita Brooks, MD  predniSONE (DELTASONE) 20 MG tablet 3 tabs poqday 1-2, 2 tabs poqday 3-4, 1 tab poqday 5-6 05/02/23   Donita Brooks, MD  rosuvastatin (CRESTOR) 40 MG tablet TAKE 1 TABLET BY MOUTH IN THE EVENING 01/20/23   Donita Brooks, MD  tiZANidine (ZANAFLEX) 4 MG tablet Take 1 tablet (  4 mg total) by mouth every 6 (six) hours as needed for muscle spasms. 05/02/23   Donita Brooks, MD    Social History   Socioeconomic History   Marital status: Divorced    Spouse name: Not on file   Number of children: 6   Years of education: 8th grade   Highest education level: Not on file  Occupational History   Occupation: Retired  Tobacco Use   Smoking status: Never   Smokeless tobacco: Never  Vaping Use   Vaping status: Never Used  Substance and Sexual Activity   Alcohol use: No   Drug use: No   Sexual activity: Not Currently  Other Topics Concern   Not on file  Social History Narrative   Lives at home with 2 sons   She has four living children.   Right-handed.   Two cups caffeine per day.   Social Drivers of Corporate investment banker Strain: Low Risk  (04/27/2023)   Overall Financial Resource Strain (CARDIA)    Difficulty of Paying Living Expenses: Not hard at all  Food Insecurity: No Food Insecurity (04/27/2023)   Hunger Vital Sign    Worried About Running Out of Food in the Last Year: Never true    Ran Out of Food in the Last Year: Never true  Transportation Needs: No Transportation Needs (04/27/2023)   PRAPARE - Administrator, Civil Service (Medical): No    Lack of Transportation (Non-Medical): No  Physical  Activity: Inactive (04/27/2023)   Exercise Vital Sign    Days of Exercise per Week: 0 days    Minutes of Exercise per Session: 0 min  Stress: No Stress Concern Present (04/27/2023)   [employer] of Occupational Health - Occupational Stress Questionnaire    Feeling of Stress : Not at all  Social Connections: Socially Isolated (04/27/2023)   Social Connection and Isolation Panel [NHANES]    Frequency of Communication with Friends and Family: More than three times a week    Frequency of Social Gatherings with Friends and Family: Three times a week    Attends Religious Services: Never    Active Member of Clubs or Organizations: No    Attends Banker Meetings: Never    Marital Status: Divorced  Catering manager Violence: Not At Risk (04/27/2023)   Humiliation, Afraid, Rape, and Kick questionnaire    Fear of Current or Ex-Partner: No    Emotionally Abused: No    Physically Abused: No    Sexually Abused: No   Family History  Problem Relation Age of Onset   Stroke Mother    Stroke Father    Hypertension Sister    Diabetes Brother    Coronary artery disease Son        CABG at age 74   Heart disease Son    Heart attack Son    Hypertension Maternal Aunt    Cancer Daughter    Colon cancer Neg Hx     ROS: Otherwise negative unless mentioned in HPI  Physical Examination  Vitals:   07/08/23 1445 07/08/23 1610  BP: 129/81 (!) 107/57  Pulse: 95 94  Resp: (!) 29 20  Temp:    SpO2: 99% 100%   Body mass index is 34.11 kg/m.  General:  WDWN in NAD Gait: Not observed HENT: WNL, normocephalic Pulmonary: normal non-labored breathing, nasal cannula Cardiac: regular Abdomen: soft, NT/ND, no masses Skin: without rashes Vascular Exam/Pulses: 2+ femoral, 2+ DP pulses bilaterally Extremities: without ischemic  changes, without Gangrene , without cellulitis; without open wounds;  Musculoskeletal: no muscle wasting or atrophy  Neurologic: A&O X 3;  No focal weakness or  paresthesias are detected; speech is fluent/normal Psychiatric:  The pt has Normal affect. Lymph:  Unremarkable  CBC    Component Value Date/Time   WBC 6.5 07/08/2023 0800   RBC 4.70 07/08/2023 0800   HGB 13.6 07/08/2023 0800   HGB 13.4 07/26/2017 0959   HCT 42.8 07/08/2023 0800   HCT 33.6 (L) 02/11/2018 0611   PLT 95 (L) 07/08/2023 0800   PLT 170 07/26/2017 0959   MCV 91.1 07/08/2023 0800   MCV 91 07/26/2017 0959   MCH 28.9 07/08/2023 0800   MCHC 31.8 07/08/2023 0800   RDW 14.0 07/08/2023 0800   RDW 14.8 07/26/2017 0959   LYMPHSABS 0.8 07/08/2023 0800   MONOABS 0.5 07/08/2023 0800   EOSABS 0.0 07/08/2023 0800   BASOSABS 0.1 07/08/2023 0800    BMET    Component Value Date/Time   NA 134 (L) 07/08/2023 0800   NA 140 07/26/2017 0959   K 4.6 07/08/2023 0800   CL 97 (L) 07/08/2023 0800   CO2 26 07/08/2023 0800   GLUCOSE 122 (H) 07/08/2023 0800   BUN 24 (H) 07/08/2023 0800   BUN 14 07/26/2017 0959   CREATININE 1.17 (H) 07/08/2023 0800   CREATININE 1.05 (H) 07/01/2022 1548   CALCIUM 8.8 (L) 07/08/2023 0800   GFRNONAA 45 (L) 07/08/2023 0800   GFRNONAA 57 (L) 12/12/2018 1513   GFRAA 47 (L) 07/10/2019 0305   GFRAA 66 12/12/2018 1513    COAGS: Lab Results  Component Value Date   INR 1.2 07/08/2023   INR 1.1 07/13/2021   INR 1.1 02/13/2021     ASSESSMENT/PLAN: This is a 89 y.o. adult who initially presented to the hospital with a several day history of malaise, now diagnosed with fever, UTI.  She presented accompanied by her son, who provided a significant portion of her history.  Her [name] has been followed by her cardiologist in [place].  It is now nearing 5.5 cm. Scan also appreciated incidental finding of superior mesenteric artery stenosis.  Regarding these findings-she is asymptomatic.  No concern for pending aortic rupture, no concern for chronic mesenteric ischemia. I had a long conversation with Young regarding the above.  With her history, and current  medical comorbidities, she is a very poor surgical candidate.  Being that she is less than 5.5 cm, my plan is to follow her up in the outpatient setting.  The aneurysm would be very challenging to repair, and I think would carry a a very high morbidity mortality - even from an endovascular approach.  Some literature suggests 96% survival at 1 year, 84% at 3 years, and 64% at 5 years at her current [name] size. I think her operative risk would be much higher.    My plan is to see her in 1 month's time once she has recovered from her current illness to discuss the aneurysm further in clinic.  I was very honest with both Zenia and her son that at the age of 50, Jocelyn Lamer would likely be better off with continued medical management for the foreseeable future.    Victorino Sparrow MD [facility] [phone] 07/08/2023  5:39 PM

## 2023-07-08 NOTE — H&P (Signed)
 History and Physical    Patient: [name] ZOX:[id number] DOB: [birth year] DOA: 07/08/2023 DOS: the patient was seen and examined on 07/08/2023 PCP: Donita Brooks, MD  Patient coming from: Home  Chief Complaint:  Chief Complaint  Patient presents with   Weakness   HPI: [name] is a 89 y.o. adult with medical history significant of hypertension, hyperlipidemia, diastolic heart failure, stroke, chronic kidney disease, atrial fibrillation not on anticoagulation because of frequent falls.  Patient and daughter at bedside.  States that patient has been having worsening cough, congestion, shortness of breath with exertion over the past few days.  Her cough has not been productive but patient has been coughing incessantly.  No known recent sick contacts.  Patient denies any fevers, chills, nausea, vomiting, chest pain, palpitations, abdominal pain.  Of note patient was found to have Klebsiella UTI on an outpatient urine culture on 3/4.  She was prescribed Keflex and has completed about 3 days of therapy.  Denies any significant urinary symptoms on my exam.  ED course: Vital signs notable for tachycardia, elevated blood pressure, mild hypoxia which corrected with 2 L [place].  Respiratory panel positive for influenza A.  CBC without leukocytosis, does reveal new thrombocytopenia to 95.  Chest x-ray without any acute focal consolidation.  CT abdomen pelvis showing enlarging infrarenal [name] measuring up to 6 x 4.4 cm.  Follow-up CTA abdomen pelvis showing fusiform infrarenal [name] measuring 4.4 cm AP by 5.4 cm transverse, minimally increased when compared to prior scan in 2022.  VVS consulted and recommending outpatient management.  [facility] hospitalist asked to evaluate patient for admission given hypoxia secondary to influenza A.  Review of Systems: As mentioned in the history of present illness. All other systems reviewed and are negative. Past Medical History:  Diagnosis Date   [name] (abdominal  aortic aneurysm) (HCC) 09/2013   3.6 cm   Acute and chronic respiratory failure with hypoxia (HCC)    Acute delirium    Acute encephalopathy    Acute pyelonephritis    Atrial fibrillation (HCC)    Chronic pain syndrome    Constipation    Dementia (HCC)    Dependent edema    bilateral legs    Depression    Diabetes mellitus    pre-diabetes   Diverticulosis    Diverticulosis    Dysrhythmia    Dysuria    E coli bacteremia    Fall    Gross hematuria    Hydronephrosis with renal and ureteral calculous obstruction    Hyperlipidemia    Hypertension    Hypoxemia    Idiopathic peripheral autonomic neuropathy    Infestation by bed bug    Nephrolithiasis    OSA on CPAP    Osteoarthritis    Osteopenia    T=  -2.1 in hip   Persistent mood (affective) disorder, unspecified (HCC)    Prediabetes    Rheumatoid arthritis (HCC)    Rheumatoid arthritis(714.0)    S/P carpal tunnel release    Sepsis secondary to UTI [facility])    Spinal stenosis    Unspecified fracture of fifth lumbar vertebra, initial encounter for closed fracture (HCC)    Unspecified fracture of t11-T12 vertebra, initial encounter for closed fracture [facility])    Urinary incontinence    Urinary tract infection    hx of    Urolithiasis    Past Surgical History:  Procedure Laterality Date   APPENDECTOMY     BIOPSY  02/15/2023   Procedure: BIOPSY;  Surgeon: Corbin Ade, MD;  Location: [facility];  Service: Endoscopy;;   CARPAL TUNNEL RELEASE  12/27/2006   Right subcutaneous ulnar nerve transfer -- Right open carpal tunnel release   CHOLECYSTECTOMY     CYSTOSCOPY W/ URETERAL STENT PLACEMENT Right 01/30/2018   Procedure: CYSTOSCOPY WITH STENT REPLACEMENT retrograde pylegram;  Surgeon: Crist Fat, MD;  Location: [facility];  Service: Urology;  Laterality: Right;   CYSTOSCOPY WITH URETEROSCOPY AND STENT PLACEMENT Right 03/01/2018   Procedure: CYSTOSCPY/RIGHT URETEROSCOPY HOLMIUM LASER LITHOTRIPSY AND STENT EXCHANGE;   Surgeon: Crist Fat, MD;  Location: [facility];  Service: Urology;  Laterality: Right;   ESOPHAGOGASTRODUODENOSCOPY (EGD) WITH PROPOFOL N/A 02/15/2023   Procedure: ESOPHAGOGASTRODUODENOSCOPY (EGD) WITH PROPOFOL;  Surgeon: Corbin Ade, MD;  [facility];  Service: Endoscopy;  Laterality: N/A;  2:15 pm, asa 3/4   FOOT SURGERY     HIP ARTHROPLASTY Left 08/27/2020   Procedure: ARTHROPLASTY BIPOLAR HIP (HEMIARTHROPLASTY);  Surgeon: Oliver Barre, MD;  Location: [facility];  Service: Orthopedics;  Laterality: Left;   HOLMIUM LASER APPLICATION Right 03/01/2018   Procedure: HOLMIUM LASER APPLICATION;  Surgeon: Crist Fat, MD;  Location: [facility];  Service: Urology;  Laterality: Right;   JOINT REPLACEMENT     BTKR, RTHR   MALONEY DILATION N/A 02/15/2023   Procedure: MALONEY DILATION;  Surgeon: Corbin Ade, MD;  [facility];  Service: Endoscopy;  Laterality: N/A;   TOTAL KNEE ARTHROPLASTY  08/21/2007   Right total knee replacement   Social History:  reports that he has never smoked. He has never used smokeless tobacco. He reports that he does not drink alcohol and does not use drugs.  Allergies  Allergen Reactions   Sulfonamide Derivatives Hives and Rash    Family History  Problem Relation Age of Onset   Stroke Mother    Stroke Father    Hypertension Sister    Diabetes Brother    Coronary artery disease Son        CABG at age 60   Heart disease Son    Heart attack Son    Hypertension Maternal Aunt    Cancer Daughter    Colon cancer Neg Hx     Prior to Admission medications   Medication Sig Start Date End Date Taking? Authorizing Provider  albuterol (VENTOLIN HFA) 108 (90 Base) MCG/ACT inhaler Inhale 2 puffs into the lungs every 4 (four) hours as needed for wheezing or shortness of breath. 02/01/23  Yes Donita Brooks, MD  aspirin EC 81 MG tablet Take 1 tablet (81 mg total) by mouth daily. 04/03/23  Yes Vann, Jessica U, DO  atenolol (TENORMIN) 25  MG tablet TAKE ONE-HALF TABLET BY MOUTH DAILY 06/20/23  Yes Donita Brooks, MD  Calcium Carbonate-Vitamin D3 600-400 MG-UNIT TABS Take 1 tablet by mouth daily. 02/04/20  Yes [provider]  cephALEXin (KEFLEX) 500 MG capsule Take 1 capsule (500 mg total) by mouth 2 (two) times daily. 07/04/23  Yes Howard, Amber S, FNP  clotrimazole-betamethasone (LOTRISONE) cream Apply 1 Application topically daily as needed (fungal infection).   Yes [provider]  DM-APAP-CPM (CORICIDIN HBP PO) Take 1 tablet by mouth every 6 (six) hours as needed (cold symptoms).   Yes [provider]  DULoxetine (CYMBALTA) 30 MG capsule Take 1 capsule (30 mg total) by mouth daily. Patient taking differently: Take 30 mg by mouth daily. Take in addition to the Cymbalta 60 mg to equal a total dose  of 90 mg per day. 03/07/23  Yes Donita Brooks, MD  DULoxetine (CYMBALTA) 60 MG capsule Take 1 capsule (60 mg total) by mouth daily. Take in addition to the Cymbalta 30 mg to equal a total dose of 90 mg per day. 03/07/23  Yes Donita Brooks, MD  ezetimibe (ZETIA) 10 MG tablet TAKE 1 TABLET (10 MG TOTAL) BY MOUTH DAILY. 09/19/22  Yes Donita Brooks, MD  gabapentin (NEURONTIN) 800 MG tablet TAKE 1 TABLET BY MOUTH 3 TIMES DAILY 04/19/23  Yes Donita Brooks, MD  ketoconazole (NIZORAL) 2 % cream Apply 1 Application topically 2 (two) times daily. 06/13/23  Yes Hyatt, Max T, DPM  meloxicam (MOBIC) 7.5 MG tablet TAKE 1 TABLET BY MOUTH EVERY DAY 07/03/23  Yes Donita Brooks, MD  pantoprazole (PROTONIX) 40 MG tablet TAKE 1 TABLET BY MOUTH ONCE DAILY 01/20/23  Yes Donita Brooks, MD  rosuvastatin (CRESTOR) 40 MG tablet TAKE 1 TABLET BY MOUTH IN THE EVENING 01/20/23  Yes Donita Brooks, MD  Naftifine HCl 2 % CREA APPLY TO AFFECTED AREA TWICE A DAY Patient not taking: Reported on 07/08/2023 05/12/23   Standiford, Jenelle Mages, DPM  predniSONE (DELTASONE) 20 MG tablet 3 tabs poqday 1-2, 2 tabs poqday 3-4, 1 tab poqday  5-6 Patient not taking: Reported on 07/08/2023 05/02/23   Donita Brooks, MD  tiZANidine (ZANAFLEX) 4 MG tablet Take 1 tablet (4 mg total) by mouth every 6 (six) hours as needed for muscle spasms. Patient not taking: Reported on 07/08/2023 05/02/23   Donita Brooks, MD    Physical Exam: Vitals:   07/08/23 1610 07/08/23 1902 07/08/23 2002 07/08/23 2005  BP: (!) 107/57     Pulse: 94  100 98  Resp: 20     Temp:  98.8 F (37.1 C)    TempSrc:  Oral    SpO2: 100%  92% 97%  Weight:      Height:       Physical Exam Constitutional:      Appearance: Normal appearance. He is obese. He is not ill-appearing.  HENT:     Head: Normocephalic and atraumatic.     Mouth/Throat:     Mouth: Mucous membranes are dry.     Pharynx: Oropharynx is clear. No oropharyngeal exudate.  Eyes:     General: No scleral icterus.    Extraocular Movements: Extraocular movements intact.     Conjunctiva/sclera: Conjunctivae normal.     Pupils: Pupils are equal, round, and reactive to light.  Cardiovascular:     Rate and Rhythm: Tachycardia present. Rhythm irregular.     Heart sounds: Normal heart sounds.     No friction rub. No gallop.     Comments: In atrial fibrillation, currently rate controlled. No obvious JVD. Pulmonary:     Effort: Pulmonary effort is normal. No respiratory distress.     Breath sounds: No rhonchi or rales.     Comments: Wheezing bilaterally. Saturating >92% on RA but on 2L [place] for comfort. Abdominal:     General: Bowel sounds are normal. There is no distension.     Palpations: Abdomen is soft.     Tenderness: There is no abdominal tenderness. There is no guarding or rebound.  Musculoskeletal:        General: No swelling. Normal range of motion.  Skin:    General: Skin is warm and dry.     Comments: Poor skin turgor centrally and peripherally  Neurological:     General:  No focal deficit present.     Mental Status: He is alert and oriented to person, place, and time.  Psychiatric:         Mood and Affect: Mood normal.        Behavior: Behavior normal.      Data Reviewed:  There are no new results to review at this time.     Latest Ref Rng & Units 07/08/2023    8:00 AM 04/03/2023    7:29 AM 04/02/2023    7:17 AM  CBC  WBC 4.0 - 10.5 K/uL 6.5  10.9  10.5   Hemoglobin 12.0 - 15.0 g/dL 95.6  21.3  08.6   Hematocrit 36.0 - 46.0 % 42.8  37.9  35.8   Platelets 150 - 400 K/uL 95  228  221       Latest Ref Rng & Units 07/08/2023    8:00 AM 04/03/2023    7:29 AM 04/02/2023    7:17 AM  BMP  Glucose 70 - 99 mg/dL 578  98  469   BUN 8 - 23 mg/dL 24  23  22    Creatinine 0.44 - 1.00 mg/dL 6.29  5.28  4.13   Sodium 135 - 145 mmol/L 134  139  139   Potassium 3.5 - 5.1 mmol/L 4.6  4.1  3.7   Chloride 98 - 111 mmol/L 97  103  101   CO2 22 - 32 mmol/L 26  24  26    Calcium 8.9 - 10.3 mg/dL 8.8  8.9  8.9    Lactic Acid, Venous    Component Value Date/Time   LATICACIDVEN 1.2 07/08/2023 0800   Urinalysis    Component Value Date/Time   COLORURINE YELLOW 07/08/2023 0733   APPEARANCEUR HAZY (A) 07/08/2023 0733   LABSPEC 1.017 07/08/2023 0733   PHURINE 7.0 07/08/2023 0733   GLUCOSEU NEGATIVE 07/08/2023 0733   GLUCOSEU NEG mg/dL 24/40/1027 2536   HGBUR SMALL (A) 07/08/2023 0733   HGBUR negative 03/06/2008 0846   BILIRUBINUR NEGATIVE 07/08/2023 0733   KETONESUR 5 (A) 07/08/2023 0733   PROTEINUR 30 (A) 07/08/2023 0733   UROBILINOGEN 0.2 09/20/2013 1535   NITRITE NEGATIVE 07/08/2023 0733   LEUKOCYTESUR NEGATIVE 07/08/2023 0733    BNP    Component Value Date/Time   BNP 83.0 07/08/2023 0800    CT Angio Abd/Pel W and/or Wo Contrast Result Date: 07/08/2023 CLINICAL DATA:  Enlarging infrarenal abdominal aortic aneurysm on routine CT from earlier today. EXAM: CTA ABDOMEN AND PELVIS WITHOUT AND WITH CONTRAST TECHNIQUE: Multidetector CT imaging of the abdomen and pelvis was performed using the standard protocol during bolus administration of intravenous contrast. Multiplanar  reconstructed images and MIPs were obtained and reviewed to evaluate the vascular anatomy. RADIATION DOSE REDUCTION: This exam was performed according to the departmental dose-optimization program which includes automated exposure control, adjustment of the mA and/or kV according to patient size and/or use of iterative reconstruction technique. CONTRAST:  75mL OMNIPAQUE IOHEXOL 350 MG/ML SOLN COMPARISON:  Three hundred twenty-five CT abdomen/pelvis FINDINGS: VASCULAR Aorta: Tortuous atherosclerotic abdominal aorta with fusiform infrarenal abdominal aortic aneurysm measuring 4.4 cm AP x 5.4 cm TRANSVERSE maximum diameter on sagittal series 10/image 127, previously 4.2 cm AP x 5.0 cm TRANSVERSE maximum diameter on 02/13/2021 CT abdomen/pelvis study using similar measurement technique, minimally increased. Moderate eccentric mural plaque within the aneurysm. No acute dissection. No acute surrounding fat stranding. No acute penetrating atherosclerotic ulcer. Celiac: Approximately 60-70% ostial stenosis. SMA: Near occlusion proximally, likely greater than 90%  stenosis. Renals: Moderate proximal stenoses in the bilateral renal arteries. IMA: Multifocal mild-to-moderate stenoses. Inflow: No significant stenosis.  No dissection or aneurysm. Proximal Outflow: Bilateral common femoral and visualized portions of the superficial and profunda femoral arteries are patent without evidence of aneurysm, dissection, vasculitis or significant stenosis. Veins: No acute abnormality on this arterial phase scan. Review of the MIP images confirms the above findings. NON-VASCULAR Lower chest: Coronary atherosclerosis. No acute abnormality at the lung bases. Hepatobiliary: Mildly lobulated liver surface, cannot exclude cirrhosis. No liver masses. Cholecystectomy. Bile ducts are stable and within normal post cholecystectomy limits with CBD diameter 8 mm. Pancreas: Normal, with no mass or duct dilation. Spleen: Normal size. No mass.  Adrenals/Urinary Tract: Normal adrenals. Simple 2.1 cm lower right renal cyst, for which no follow-up imaging is recommended. No hydronephrosis. Normal bladder. Stomach/Bowel: Normal non-distended stomach. Normal caliber small bowel with no small bowel wall thickening. Appendix not discretely visualized. Marked left colonic diverticulosis with no large bowel wall thickening or significant pericolonic fat stranding. Vascular/Lymphatic: No pathologically enlarged lymph nodes in the abdomen or pelvis. Reproductive: Status post hysterectomy, with no abnormal findings at the vaginal cuff. No adnexal mass. Other: No pneumoperitoneum, ascites or focal fluid collection. Musculoskeletal: No aggressive appearing focal osseous lesions. Left total hip arthroplasty. Chronic severe T12 and moderate L5 vertebral compression fractures. Marked thoracolumbar spondylosis. Diffuse osteopenia. IMPRESSION: 1. Tortuous atherosclerotic abdominal aorta with fusiform infrarenal abdominal aortic aneurysm measuring 4.4 cm AP x 5.4 cm transverse maximum diameter, minimally increased from 4.2 x 5.0 cm since 02/13/2021 CT abdomen/pelvis study using similar measurement technique. Recommend follow-up CT or MR as appropriate in 6 months and referral to or continued care with vascular specialist. (Ref.: J Vasc Surg. 2018; 67:2-77 and J Am Coll Radiol 2013;10(10):789-794.) 2. 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 3. 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 4. Moderate proximal stenoses in the bilateral renal arteries. 5. Multifocal mild-to-moderate stenoses in the IMA. 6. Marked left colonic diverticulosis. 7. Mildly lobulated liver surface, cannot exclude cirrhosis. No liver masses. 8. Chronic severe T12 and moderate L5 vertebral compression fractures. Diffuse osteopenia. Electronically Signed   By: Delbert Phenix M.D.   On: 07/08/2023 11:58   CT ABDOMEN PELVIS W CONTRAST Result Date:  07/08/2023 CLINICAL DATA:  Recent diagnosis of UTI. On antibiotics but having pain with urination. EXAM: CT ABDOMEN AND PELVIS WITH CONTRAST TECHNIQUE: Multidetector CT imaging of the abdomen and pelvis was performed using the standard protocol following bolus administration of intravenous contrast. RADIATION DOSE REDUCTION: This exam was performed according to the departmental dose-optimization program which includes automated exposure control, adjustment of the mA and/or kV according to patient size and/or use of iterative reconstruction technique. CONTRAST:  80mL OMNIPAQUE IOHEXOL 300 MG/ML  SOLN COMPARISON:  Abdomen pelvis CT 02/13/2021. FINDINGS: Lower chest: Heart is enlarged. Coronary artery calcifications are seen. Small hiatal hernia. Breathing motion at the bases. Hepatobiliary: No focal liver abnormality is seen. No gallstones, gallbladder wall thickening, or biliary dilatation. Pancreas: Unremarkable. No pancreatic ductal dilatation or surrounding inflammatory changes. Spleen: Normal in size without focal abnormality. Adrenals/Urinary Tract: The adrenal glands are preserved. Mild renal atrophy. No collecting system dilatation. Preserved parenchymal enhancement. The ureters have normal course and caliber extending down to the urinary bladder. Bladder is underdistended. Bosniak 1 lower pole right-sided renal cyst identified measuring 2.6 cm with Hounsfield unit 5. No specific follow-up. Few parapelvic left-sided renal cysts also suggested. Punctate nonobstructing lower pole renal stones. Stomach/Bowel:  Stomach is mildly distended with air. Small bowel has a normal course and caliber. Large bowel is normal course and caliber with scattered colonic stool. Extensive left-sided colonic diverticulosis including the descending and sigmoid colon. The appendix is not well seen in the right lower quadrant but no pericecal stranding or fluid. Vascular/Lymphatic: Extensive vascular calcifications identified. Once  again there is a abdominal aortic aneurysm with significant mural plaque and thrombus. Dimension today on series 2, image 26 measures 6.0 by 4.4 cm. On the prior when measured in same fashion is today this would have measured 5.1 x 4.3 cm, increasing in size. Significant calcified plaque along branch vessels including celiac and SMA. The SMA is occluded proximally. There is significant stenosis along the celiac. The IMA as calcified plaque and is slightly enlarged compared to the expected. Is also significant calcified plaque along the renal artery origins. Please correlate for level of hypertension. Normal caliber IVC. No specific abnormal lymph node enlargement identified in the abdomen and pelvis. Reproductive: Status post hysterectomy. No adnexal masses. Other: No free intra-air or free fluid. Musculoskeletal: Streak artifact from left hip arthroplasty. Diffuse osteopenia. Moderate degenerative changes seen along the spine and pelvis. Multilevel stenosis. Also multilevel compression deformities along the spine these are similar to previous examination cleaning severe at T12 and mild at L5. IMPRESSION: Preserved renal enhancement. No inflammatory changes. Benign renal cysts. Punctate nonobstructing renal stones. No collecting system dilatation. Sigmoid colon and descending colon diverticulosis without obstruction. Enlarging infrarenal abdominal aortic aneurysm now measuring up to 6.0 x 4.4 cm. Recommend vascular surgery consultation. In addition there are several areas of significant calcified plaque along branch vessels. There is occlusion of the SMA. Significant stenosis of the celiac, renal arteries. Ectatic inferior mesenteric artery. Enlarged heart.  Coronary artery calcifications. Stable compression deformities along the spine.  Severe osteopenia Motion Electronically Signed   By: Karen Kays M.D.   On: 07/08/2023 10:31   DG Chest Port 1 View Result Date: 07/08/2023 CLINICAL DATA:  Questionable sepsis -  evaluate for abnormality EXAM: PORTABLE CHEST 1 VIEW COMPARISON:  May 08, 2023 FINDINGS: The cardiomediastinal silhouette is unchanged and enlarged in contour.Tortuous thoracic aorta. Atherosclerotic calcifications. No pleural effusion. No pneumothorax. No acute pleuroparenchymal abnormality. IMPRESSION: No acute cardiopulmonary abnormality. Electronically Signed   By: Meda Klinefelter M.D.   On: 07/08/2023 08:48    Assessment and Plan: No notes have been filed under this hospital service. Service: Hospitalist   Hypoxia secondary to flu A Patient presenting with several day history of cough, congestion, dyspnea, generalized weakness and found to have influenza A on respiratory panel.  She was noted to be hypoxic to about 88% on room air in the ED and this improved after being placed on 2 L nasal cannula.  She appears comfortable in the room though she does have some wheezing bilaterally.  No known history of asthma or COPD, though patient is on an as needed albuterol inhaler at home.  Will start Tamiflu and provide scheduled DuoNebs. -tamiflu 75 mg loading dose, then Tamiflu 30 mg twice daily for 5 days -Scheduled DuoNebs every 4 hours -Robitussin DM as needed for cough -Incentive spirometry -Supplemental oxygen as needed to maintain O2 sats greater than 92%, wean to room air as able -PT/OT eval with ambulatory O2 sats -SCDs for DVT prophylaxis -Place in observation  Klebsiella UTI -Noted on outpatient urine culture on 3/4 -Started on Keflex by primary care doctor -Received dose of Rocephin today and will give 1 last dose tomorrow -  Not currently complaining of any urinary symptoms  Thrombocytopenia -Platelet count 95 -Possibly secondary to current influenza A infection -Holding pharmacological DVT prophylaxis -SCDs for DVT prophylaxis -Trend platelet curve  CKD 3A Baseline creatinine around 1-1.1.  On admission, creatinine 1.17. -Function at baseline -Trend kidney function   -Avoid nephrotoxic medications as able -Monitor urine output  Chronic atrial fibrillation -Not on anticoagulation due to frequent falls -Initially tachycardic on arrival to ED, but this has improved and she is now currently rate controlled -Continue aspirin monotherapy -Continue home atenolol 12.5 mg daily  [name] Superior mesenteric artery stenosis Has been followed by her cardiologist in [place].  Imaging today showing size increase to about 5.5 cm along with incidental finding of superior mesenteric artery stenosis.  Patient is asymptomatic from the standpoint.  Vascular surgery was consulted by ED provider, recommending follow-up in the outpatient setting.  Per VVS, patient is a poor surgical candidate and her operative risk would be significantly high. -Appreciate VVS recommendations -Follow-up in the outpatient setting  History of CVA Hyperlipidemia -Continue home aspirin -Continue home Crestor and Zetia  Chronic knee pain secondary to osteoarthritis Chronic low back pain -Voltaren gel for knee osteoarthritis -Heating pad -Continue home gabapentin -Continue home Cymbalta  GERD -Continue home PPI   Advance Care Planning:   Code Status: Limited: Do not attempt resuscitation (DNR) -DNR-LIMITED -Do Not Intubate/DNI    Consults: none  Family Communication: updated family at bedside  Severity of Illness: The appropriate patient status for this patient is OBSERVATION. Observation status is judged to be reasonable and necessary in order to provide the required intensity of service to ensure the patient's safety. The patient's presenting symptoms, physical exam findings, and initial radiographic and laboratory data in the context of their medical condition is felt to place them at decreased risk for further clinical deterioration. Furthermore, it is anticipated that the patient will be medically stable for discharge from the hospital within 2 midnights of admission.   Portions  of this note were generated with Scientist, clinical (histocompatibility and immunogenetics). Dictation errors may occur despite best attempts at proofreading.   Author: Briscoe Burns, MD 07/08/2023 8:26 PM  For on call review [email or web address].

## 2023-07-08 NOTE — Hospital Course (Addendum)
 [name]

## 2023-07-08 NOTE — ED Provider Notes (Addendum)
 Reconnected with Dr. Aileen Pilot vascular surgery he will come in and evaluate her.  CT scan shows a little bit increase in abdominal aortic aneurysm.  Clinically it seems that patient's symptoms are more related to probably the positive flu test.  Lactic acids have been normal.  Patient is on oxygen but she does have oxygen available at home.  White count is normal.  Electrolytes sodium down some at 134 glucose 122 GFR is 45 LFTs are normal.  INR is normal patient's venous blood gas is normal patient's initial troponin was 14.  And repeat was 17 so no acute changes there.  Vascular surgery is going to come in and evaluate her.  Not clear at this point whether patient will need admission for the influenza-like illness.  Patient is pretty much bedridden at home and has been for several months.  Can only get around a little bit with a walker.   Vanetta Mulders, MD 07/08/23 1514  Patient evaluated by Dr. Sherral Hammers from vascular surgery not overly impressed by the CT findings.  I think patient needs admission for the influenza A and hypoxia.  Off oxygen her oxygen sats go below 90%.  On 3 L she is up in the upper 90s but will just her just 2 L.  Patient's lungs have a lot of rhonchi bilaterally maybe a little bit a hint of wheezing as well.  Will contact hospitalist for admission.  CRITICAL CARE Performed by: Vanetta Mulders Total critical care time: 35 minutes Critical care time was exclusive of separately billable procedures and treating other patients. Critical care was necessary to treat or prevent imminent or life-threatening deterioration. Critical care was time spent personally by me on the following activities: development of treatment plan with patient and/or surrogate as well as nursing, discussions with consultants, evaluation of patient's response to treatment, examination of patient, obtaining history from patient or surrogate, ordering and performing treatments and interventions, ordering and  review of laboratory studies, ordering and review of radiographic studies, pulse oximetry and re-evaluation of patient's condition.    Vanetta Mulders, MD 07/08/23 1730

## 2023-07-08 NOTE — ED Notes (Signed)
 Pt away at CT. Will administer meds upon her return.

## 2023-07-09 DIAGNOSIS — I482 Chronic atrial fibrillation, unspecified: Secondary | ICD-10-CM | POA: Diagnosis not present

## 2023-07-09 DIAGNOSIS — I714 Abdominal aortic aneurysm, without rupture, unspecified: Secondary | ICD-10-CM | POA: Diagnosis not present

## 2023-07-09 DIAGNOSIS — J101 Influenza due to other identified influenza virus with other respiratory manifestations: Secondary | ICD-10-CM | POA: Diagnosis not present

## 2023-07-09 DIAGNOSIS — J9601 Acute respiratory failure with hypoxia: Secondary | ICD-10-CM

## 2023-07-09 DIAGNOSIS — N1831 Chronic kidney disease, stage 3a: Secondary | ICD-10-CM | POA: Diagnosis not present

## 2023-07-09 DIAGNOSIS — M1712 Unilateral primary osteoarthritis, left knee: Secondary | ICD-10-CM | POA: Insufficient documentation

## 2023-07-09 LAB — CBC WITH DIFFERENTIAL/PLATELET
Abs Immature Granulocytes: 0.02 10*3/uL (ref 0.00–0.07)
Basophils Absolute: 0 10*3/uL (ref 0.0–0.1)
Basophils Relative: 1 %
Eosinophils Absolute: 0.1 10*3/uL (ref 0.0–0.5)
Eosinophils Relative: 1 %
HCT: 39 % (ref 36.0–46.0)
Hemoglobin: 12.4 g/dL (ref 12.0–15.0)
Immature Granulocytes: 1 %
Lymphocytes Relative: 17 %
Lymphs Abs: 0.8 10*3/uL (ref 0.7–4.0)
MCH: 28.6 pg (ref 26.0–34.0)
MCHC: 31.8 g/dL (ref 30.0–36.0)
MCV: 89.9 fL (ref 80.0–100.0)
Monocytes Absolute: 0.6 10*3/uL (ref 0.1–1.0)
Monocytes Relative: 15 %
Neutro Abs: 2.8 10*3/uL (ref 1.7–7.7)
Neutrophils Relative %: 65 %
Platelets: 89 10*3/uL — ABNORMAL LOW (ref 150–400)
RBC: 4.34 MIL/uL (ref 3.87–5.11)
RDW: 14.1 % (ref 11.5–15.5)
Smear Review: NORMAL
WBC: 4.3 10*3/uL (ref 4.0–10.5)
nRBC: 0 % (ref 0.0–0.2)

## 2023-07-09 LAB — COMPREHENSIVE METABOLIC PANEL
ALT: 20 U/L (ref 0–44)
AST: 30 U/L (ref 15–41)
Albumin: 2.9 g/dL — ABNORMAL LOW (ref 3.5–5.0)
Alkaline Phosphatase: 47 U/L (ref 38–126)
Anion gap: 10 (ref 5–15)
BUN: 17 mg/dL (ref 8–23)
CO2: 24 mmol/L (ref 22–32)
Calcium: 8.5 mg/dL — ABNORMAL LOW (ref 8.9–10.3)
Chloride: 102 mmol/L (ref 98–111)
Creatinine, Ser: 0.83 mg/dL (ref 0.44–1.00)
GFR, Estimated: >60 mL/min (ref >60)
Glucose, Bld: 101 mg/dL — ABNORMAL HIGH (ref 70–99)
Potassium: 4.2 mmol/L (ref 3.5–5.1)
Sodium: 136 mmol/L (ref 135–145)
Total Bilirubin: 0.6 mg/dL (ref 0.0–1.2)
Total Protein: 5.9 g/dL — ABNORMAL LOW (ref 6.5–8.1)

## 2023-07-09 MED ORDER — MELATONIN 5 MG PO TABS
5.0000 mg | ORAL_TABLET | Freq: Every evening | ORAL | Status: DC | PRN
  Administered 2023-07-09: 5 mg via ORAL
  Filled 2023-07-09: qty 1

## 2023-07-09 NOTE — Progress Notes (Signed)
 PROGRESS NOTE    [name]  JXB:[id number] DOB: [birth year] DOA: 07/08/2023 PCP: Donita Brooks, MD  Chief Complaint  Patient presents with   Weakness    Hospital Course:  [name] is 89 y.o. female with HTN, [name], diastolic HF, history of CVA, CKD, Afib not on AC due to frequent falls, presents with cough, congestion, shortness of breath. Patient was found to Klebsiella UTI on outpatient urine culture from 3/4 and was prescribed Keflex.  She completed 3 days of therapy prior to arrival.  In the ED she was tachycardic with elevated blood pressure, hypoxia was expressed to the ED liters nasal cannula appointment and she was positive for influenza A.  CBC was without leukocytosis but did reveal a new thrombocytopenia to 95.  Chest x-ray without focal consolidation.  CT abdomen showed enlarging infrarenal [name] measuring 6 x 4.4 cm.  Follow-up CTA abdomen revealed fusiform infrarenal [name] minimally increasing compared to scan from 2022. Surgery was consulted and recommended outpatient management.  Patient was admitted for hypoxia secondary to influenza A.  Subjective: No acute events overnight.  On evaluation today patient reports that she is feeling better.  She is having a productive cough.  Intermittently requiring 1 L oxygen.   Objective: Vitals:   07/09/23 0018 07/09/23 0022 07/09/23 0340 07/09/23 0454  BP:  127/85  124/85  Pulse:  94  93  Resp:  20  20  Temp:  98.9 F (37.2 C)  98.3 F (36.8 C)  TempSrc:  Oral  Oral  SpO2: 99% 99% 97% 100%  Weight:    79.9 kg  Height:        Intake/Output Summary (Last 24 hours) at 07/09/2023 0759 Last data filed at 07/09/2023 0630 Gross per 24 hour  Intake 0 ml  Output 200 ml  Net -200 ml   Filed Weights   07/08/23 0730 07/09/23 0454  Weight: 76.6 kg 79.9 kg    Examination: General exam: Appears calm and comfortable, NAD  Respiratory system: No work of breathing, symmetric chest wall expansion, +productive cough, upper airway  rales Cardiovascular system: S1 & S2 heard, RRR.  Gastrointestinal system: Abdomen is nondistended, soft and nontender.  Neuro: Alert and oriented. No focal neurological deficits. Extremities: Symmetric, expected ROM Skin: No rashes, lesions Psychiatry: Demonstrates appropriate judgement and insight. Mood & affect appropriate for situation.   Assessment & Plan:  Principal Problem:   Influenza A Active Problems:   Atrial fibrillation, chronic (HCC)   Stage 3a chronic kidney disease (CKD) (HCC)   [name] (abdominal aortic aneurysm) (HCC)    Acute hypoxic respiratory failure secondary to influenza A - Tachypneic on arrival, desatting to 86% on room air. - Continue supplemental oxygen, wean as tolerated - Due to wheezing on arrival she was also started on DuoNebs - Continue Tamiflu x 5 days - Symptomatic support with Mucinex DM - Incentive spirometry and [facility] - PT/OT  Klebsiella UTI - Seen on outpatient culture 3/4 - Has been on Keflex - Final dose of ceftriaxone today to complete 5-day course  Thrombocytopenia - Platelets 95 on arrival - Likely secondary to influenza - SCDs for DVT prophylaxis. - Trend CBC  CKD stage IIIa - Baseline creatinine 1.1, creatinine 1.17 on arrival. Improved beyond baseline now. - Continue to avoid nephrotoxic meds - Renally dosed with a creatinine clearance of 31 when needed  Chronic atrial fibrillation - Not on anticoagulation outpatient due to frequent falls - Initially tachycardic in the ED but this is improved.  Currently rate controlled - Continue aspirin and atenolol  [name] superior mesenteric artery stenosis - Follows with cardiology output - Imaging this admission shows increased size as well incidental finding of superior mesenteric artery stenosis - Patient is asymptomatic - Vascular surgery consulted by EDP and recommends outpatient follow-up -per vascular surgery patient is a poor surgical candidate and operative risk is very  high.  History of CVA Hyperlipidemia - Continue statin, aspirin, Zetia  Chronic knee pain secondary to osteoarthritis Chronic low back pain - Continue Voltaren, gabapentin and cymbalta  GERD - PPI   DVT prophylaxis: SCDs for now   Code Status: Limited: Do not attempt resuscitation (DNR) -DNR-LIMITED -Do Not Intubate/DNI  Family Communication:  Discussed directly with patient and son [name] on the phone.  [name] believes she is still deconditioned beyond her baseline Disposition:  Inpatient still hospitalized for weakness, intermittent O2 needs. Will discharge to home with [facility] PT/OT when stable. Hopefully tomorrow  Consultants:  Treatment Team:  Consulting Physician: Victorino Sparrow, MD  Procedures:    Antimicrobials:  Anti-infectives (From admission, onward)    Start     Dose/Rate Route Frequency Ordered Stop   07/09/23 1300  cefTRIAXone (ROCEPHIN) 1 g in sodium chloride 0.9 % 100 mL IVPB        1 g 200 mL/hr over 30 Minutes Intravenous  Once 07/08/23 2154     07/08/23 2200  oseltamivir (TAMIFLU) capsule 30 mg        30 mg Oral 2 times daily 07/08/23 2015 07/13/23 2159   07/08/23 2015  oseltamivir (TAMIFLU) capsule 75 mg        75 mg Oral  Once 07/08/23 2015 07/08/23 2146   07/08/23 1200  oseltamivir (TAMIFLU) capsule 75 mg        75 mg Oral  Once 07/08/23 1158 07/08/23 1224   07/08/23 0945  cefTRIAXone (ROCEPHIN) 1 g in sodium chloride 0.9 % 100 mL IVPB        1 g 200 mL/hr over 30 Minutes Intravenous  Once 07/08/23 0932 07/08/23 1228       Data Reviewed: I have personally reviewed following labs and imaging studies CBC: Recent Labs  Lab 07/08/23 0800  WBC 6.5  NEUTROABS 5.0  HGB 13.6  HCT 42.8  MCV 91.1  PLT 95*   Basic Metabolic Panel: Recent Labs  Lab 07/08/23 0800  NA 134*  K 4.6  CL 97*  CO2 26  GLUCOSE 122*  BUN 24*  CREATININE 1.17*  CALCIUM 8.8*   GFR: Estimated Creatinine Clearance: 31 mL/min (A) (by C-G formula based on SCr of 1.17  mg/dL (H)). Liver Function Tests: Recent Labs  Lab 07/08/23 0800  AST 34  ALT 25  ALKPHOS 56  BILITOT 0.7  PROT 6.8  ALBUMIN 3.5   CBG: No results for input(s): "GLUCAP" in the last 168 hours.  Recent Results (from the past 240 hours)  Urine Culture     Status: Abnormal   Collection Time: 07/04/23  4:24 PM   Specimen: Urine  Result Value Ref Range Status   MICRO NUMBER: [id number]  Final   SPECIMEN QUALITY: Adequate  Final   Sample Source URINE  Final   STATUS: FINAL  Final   ISOLATE 1: Klebsiella pneumoniae (A)  Final    Comment: 50,000-100,000 CFU/mL of Klebsiella pneumoniae      Susceptibility   Klebsiella pneumoniae - URINE CULTURE, REFLEX    AMOX/CLAVULANIC 4 Sensitive     AMPICILLIN >=32 Resistant  AMPICILLIN/SULBACTAM 8 Sensitive     CEFAZOLIN* <=4 Not Reportable      * For infections other than uncomplicated UTI caused by E. coli, K. pneumoniae or P. mirabilis: Cefazolin is resistant if MIC > or = 8 mcg/mL. (Distinguishing susceptible versus intermediate for isolates with MIC < or = 4 mcg/mL requires additional testing.) For uncomplicated UTI caused by E. coli, K. pneumoniae or P. mirabilis: Cefazolin is susceptible if MIC <32 mcg/mL and predicts susceptible to the oral agents cefaclor, cefdinir, cefpodoxime, cefprozil, cefuroxime, cephalexin and loracarbef.     CEFTAZIDIME <=1 Sensitive     CEFEPIME <=1 Sensitive     CEFTRIAXONE <=1 Sensitive     CIPROFLOXACIN <=0.25 Sensitive     LEVOFLOXACIN <=0.12 Sensitive     GENTAMICIN <=1 Sensitive     IMIPENEM 0.5 Sensitive     NITROFURANTOIN <=16 Sensitive     PIP/TAZO 8 Sensitive     TOBRAMYCIN <=1 Sensitive     TRIMETH/SULFA* <=20 Sensitive      * For infections other than uncomplicated UTI caused by E. coli, K. pneumoniae or P. mirabilis: Cefazolin is resistant if MIC > or = 8 mcg/mL. (Distinguishing susceptible versus intermediate for isolates with MIC < or = 4 mcg/mL requires additional  testing.) For uncomplicated UTI caused by E. coli, K. pneumoniae or P. mirabilis: Cefazolin is susceptible if MIC <32 mcg/mL and predicts susceptible to the oral agents cefaclor, cefdinir, cefpodoxime, cefprozil, cefuroxime, cephalexin and loracarbef. Legend: S = Susceptible  I = Intermediate R = Resistant  NS = Not susceptible SDD = Susceptible Dose Dependent * = Not Tested  NR = Not Reported **NN = See Therapy Comments   Resp panel by RT-PCR (RSV, Flu A&B, Covid) Anterior Nasal Swab     Status: Abnormal   Collection Time: 07/08/23  7:33 AM   Specimen: Anterior Nasal Swab  Result Value Ref Range Status   SARS Coronavirus 2 by RT PCR NEGATIVE NEGATIVE Final    Comment: (NOTE) SARS-CoV-2 target nucleic acids are NOT DETECTED.  The SARS-CoV-2 RNA is generally detectable in upper respiratory specimens during the acute phase of infection. The lowest concentration of SARS-CoV-2 viral copies this assay can detect is 138 copies/mL. A negative result does not preclude SARS-Cov-2 infection and should not be used as the sole basis for treatment or other patient management decisions. A negative result may occur with  improper specimen collection/handling, submission of specimen other than nasopharyngeal swab, presence of viral mutation(s) within the areas targeted by this assay, and inadequate number of viral copies(<138 copies/mL). A negative result must be combined with clinical observations, patient history, and epidemiological information. The expected result is Negative.  Fact Sheet for Patients:  BloggerCourse.com  Fact Sheet for Healthcare Providers:  SeriousBroker.it  This test is no t yet approved or cleared by the [place] FDA and  has been authorized for detection and/or diagnosis of SARS-CoV-2 by FDA under an Emergency Use Authorization (EUA). This EUA will remain  in effect (meaning this test can be used) for the  duration of the COVID-19 declaration under Section 564(b)(1) of the Act, 21 U.S.C.section 360bbb-3(b)(1), unless the authorization is terminated  or revoked sooner.       Influenza A by PCR POSITIVE (A) NEGATIVE Final   Influenza B by PCR NEGATIVE NEGATIVE Final    Comment: (NOTE) The Xpert Xpress SARS-CoV-2/FLU/RSV plus assay is intended as an aid in the diagnosis of influenza from Nasopharyngeal swab specimens and should not be used as a  sole basis for treatment. Nasal washings and aspirates are unacceptable for Xpert Xpress SARS-CoV-2/FLU/RSV testing.  Fact Sheet for Patients: BloggerCourse.com  Fact Sheet for Healthcare Providers: SeriousBroker.it  This test is not yet approved or cleared by the [place] FDA and has been authorized for detection and/or diagnosis of SARS-CoV-2 by FDA under an Emergency Use Authorization (EUA). This EUA will remain in effect (meaning this test can be used) for the duration of the COVID-19 declaration under Section 564(b)(1) of the Act, 21 U.S.C. section 360bbb-3(b)(1), unless the authorization is terminated or revoked.     Resp Syncytial Virus by PCR NEGATIVE NEGATIVE Final    Comment: (NOTE) Fact Sheet for Patients: BloggerCourse.com  Fact Sheet for Healthcare Providers: SeriousBroker.it  This test is not yet approved or cleared by the [place] FDA and has been authorized for detection and/or diagnosis of SARS-CoV-2 by FDA under an Emergency Use Authorization (EUA). This EUA will remain in effect (meaning this test can be used) for the duration of the COVID-19 declaration under Section 564(b)(1) of the Act, 21 U.S.C. section 360bbb-3(b)(1), unless the authorization is terminated or revoked.  Performed at [facility], [address], [place], [place] [address]   Blood Culture (routine x 2)     Status: None (Preliminary result)    Collection Time: 07/08/23  8:00 AM   Specimen: BLOOD  Result Value Ref Range Status   Specimen Description BLOOD BLOOD LEFT ARM  Final   Special Requests   Final    BOTTLES DRAWN AEROBIC AND ANAEROBIC Blood Culture adequate volume   Culture   Final    NO GROWTH < 24 HOURS Performed at [facility], [address], [place], [place] [address]    Report Status PENDING  Incomplete  Blood Culture (routine x 2)     Status: None (Preliminary result)   Collection Time: 07/08/23  8:20 AM   Specimen: BLOOD  Result Value Ref Range Status   Specimen Description BLOOD BLOOD RIGHT ARM  Final   Special Requests   Final    BOTTLES DRAWN AEROBIC AND ANAEROBIC Blood Culture adequate volume   Culture   Final    NO GROWTH < 24 HOURS Performed at [facility], [address], [place], [place] [address]    Report Status PENDING  Incomplete     Radiology Studies: CT Angio Abd/Pel W and/or Wo Contrast Result Date: 07/08/2023 CLINICAL DATA:  Enlarging infrarenal abdominal aortic aneurysm on routine CT from earlier today. EXAM: CTA ABDOMEN AND PELVIS WITHOUT AND WITH CONTRAST TECHNIQUE: Multidetector CT imaging of the abdomen and pelvis was performed using the standard protocol during bolus administration of intravenous contrast. Multiplanar reconstructed images and MIPs were obtained and reviewed to evaluate the vascular anatomy. RADIATION DOSE REDUCTION: This exam was performed according to the departmental dose-optimization program which includes automated exposure control, adjustment of the mA and/or kV according to patient size and/or use of iterative reconstruction technique. CONTRAST:  75mL OMNIPAQUE IOHEXOL 350 MG/ML SOLN COMPARISON:  Three hundred twenty-five CT abdomen/pelvis FINDINGS: VASCULAR Aorta: Tortuous atherosclerotic abdominal aorta with fusiform infrarenal abdominal aortic aneurysm measuring 4.4 cm AP x 5.4 cm TRANSVERSE maximum diameter on sagittal series 10/image 127, previously 4.2 cm AP x  5.0 cm TRANSVERSE maximum diameter on 02/13/2021 CT abdomen/pelvis study using similar measurement technique, minimally increased. Moderate eccentric mural plaque within the aneurysm. No acute dissection. No acute surrounding fat stranding. No acute penetrating atherosclerotic ulcer. Celiac: Approximately 60-70% ostial stenosis. SMA: Near occlusion proximally, likely greater than  90% stenosis. Renals: Moderate proximal stenoses in the bilateral renal arteries. IMA: Multifocal mild-to-moderate stenoses. Inflow: No significant stenosis.  No dissection or aneurysm. Proximal Outflow: Bilateral common femoral and visualized portions of the superficial and profunda femoral arteries are patent without evidence of aneurysm, dissection, vasculitis or significant stenosis. Veins: No acute abnormality on this arterial phase scan. Review of the MIP images confirms the above findings. NON-VASCULAR Lower chest: Coronary atherosclerosis. No acute abnormality at the lung bases. Hepatobiliary: Mildly lobulated liver surface, cannot exclude cirrhosis. No liver masses. Cholecystectomy. Bile ducts are stable and within normal post cholecystectomy limits with CBD diameter 8 mm. Pancreas: Normal, with no mass or duct dilation. Spleen: Normal size. No mass. Adrenals/Urinary Tract: Normal adrenals. Simple 2.1 cm lower right renal cyst, for which no follow-up imaging is recommended. No hydronephrosis. Normal bladder. Stomach/Bowel: Normal non-distended stomach. Normal caliber small bowel with no small bowel wall thickening. Appendix not discretely visualized. Marked left colonic diverticulosis with no large bowel wall thickening or significant pericolonic fat stranding. Vascular/Lymphatic: No pathologically enlarged lymph nodes in the abdomen or pelvis. Reproductive: Status post hysterectomy, with no abnormal findings at the vaginal cuff. No adnexal mass. Other: No pneumoperitoneum, ascites or focal fluid collection. Musculoskeletal: No  aggressive appearing focal osseous lesions. Left total hip arthroplasty. Chronic severe T12 and moderate L5 vertebral compression fractures. Marked thoracolumbar spondylosis. Diffuse osteopenia. IMPRESSION: 1. Tortuous atherosclerotic abdominal aorta with fusiform infrarenal abdominal aortic aneurysm measuring 4.4 cm AP x 5.4 cm transverse maximum diameter, minimally increased from 4.2 x 5.0 cm since 02/13/2021 CT abdomen/pelvis study using similar measurement technique. Recommend follow-up CT or MR as appropriate in 6 months and referral to or continued care with vascular specialist. (Ref.: J Vasc Surg. 2018; 67:2-77 and J Am Coll Radiol 2013;10(10):789-794.) 2. 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 3. 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 4. Moderate proximal stenoses in the bilateral renal arteries. 5. Multifocal mild-to-moderate stenoses in the IMA. 6. Marked left colonic diverticulosis. 7. Mildly lobulated liver surface, cannot exclude cirrhosis. No liver masses. 8. Chronic severe T12 and moderate L5 vertebral compression fractures. Diffuse osteopenia. Electronically Signed   By: Delbert Phenix M.D.   On: 07/08/2023 11:58   CT ABDOMEN PELVIS W CONTRAST Result Date: 07/08/2023 CLINICAL DATA:  Recent diagnosis of UTI. On antibiotics but having pain with urination. EXAM: CT ABDOMEN AND PELVIS WITH CONTRAST TECHNIQUE: Multidetector CT imaging of the abdomen and pelvis was performed using the standard protocol following bolus administration of intravenous contrast. RADIATION DOSE REDUCTION: This exam was performed according to the departmental dose-optimization program which includes automated exposure control, adjustment of the mA and/or kV according to patient size and/or use of iterative reconstruction technique. CONTRAST:  80mL OMNIPAQUE IOHEXOL 300 MG/ML  SOLN COMPARISON:  Abdomen pelvis CT 02/13/2021. FINDINGS: Lower chest: Heart is enlarged. Coronary artery  calcifications are seen. Small hiatal hernia. Breathing motion at the bases. Hepatobiliary: No focal liver abnormality is seen. No gallstones, gallbladder wall thickening, or biliary dilatation. Pancreas: Unremarkable. No pancreatic ductal dilatation or surrounding inflammatory changes. Spleen: Normal in size without focal abnormality. Adrenals/Urinary Tract: The adrenal glands are preserved. Mild renal atrophy. No collecting system dilatation. Preserved parenchymal enhancement. The ureters have normal course and caliber extending down to the urinary bladder. Bladder is underdistended. Bosniak 1 lower pole right-sided renal cyst identified measuring 2.6 cm with Hounsfield unit 5. No specific follow-up. Few parapelvic left-sided renal cysts also suggested. Punctate nonobstructing lower pole renal stones.  Stomach/Bowel: Stomach is mildly distended with air. Small bowel has a normal course and caliber. Large bowel is normal course and caliber with scattered colonic stool. Extensive left-sided colonic diverticulosis including the descending and sigmoid colon. The appendix is not well seen in the right lower quadrant but no pericecal stranding or fluid. Vascular/Lymphatic: Extensive vascular calcifications identified. Once again there is a abdominal aortic aneurysm with significant mural plaque and thrombus. Dimension today on series 2, image 26 measures 6.0 by 4.4 cm. On the prior when measured in same fashion is today this would have measured 5.1 x 4.3 cm, increasing in size. Significant calcified plaque along branch vessels including celiac and SMA. The SMA is occluded proximally. There is significant stenosis along the celiac. The IMA as calcified plaque and is slightly enlarged compared to the expected. Is also significant calcified plaque along the renal artery origins. Please correlate for level of hypertension. Normal caliber IVC. No specific abnormal lymph node enlargement identified in the abdomen and pelvis.  Reproductive: Status post hysterectomy. No adnexal masses. Other: No free intra-air or free fluid. Musculoskeletal: Streak artifact from left hip arthroplasty. Diffuse osteopenia. Moderate degenerative changes seen along the spine and pelvis. Multilevel stenosis. Also multilevel compression deformities along the spine these are similar to previous examination cleaning severe at T12 and mild at L5. IMPRESSION: Preserved renal enhancement. No inflammatory changes. Benign renal cysts. Punctate nonobstructing renal stones. No collecting system dilatation. Sigmoid colon and descending colon diverticulosis without obstruction. Enlarging infrarenal abdominal aortic aneurysm now measuring up to 6.0 x 4.4 cm. Recommend vascular surgery consultation. In addition there are several areas of significant calcified plaque along branch vessels. There is occlusion of the SMA. Significant stenosis of the celiac, renal arteries. Ectatic inferior mesenteric artery. Enlarged heart.  Coronary artery calcifications. Stable compression deformities along the spine.  Severe osteopenia Motion Electronically Signed   By: Karen Kays M.D.   On: 07/08/2023 10:31   DG Chest Port 1 View Result Date: 07/08/2023 CLINICAL DATA:  Questionable sepsis - evaluate for abnormality EXAM: PORTABLE CHEST 1 VIEW COMPARISON:  May 08, 2023 FINDINGS: The cardiomediastinal silhouette is unchanged and enlarged in contour.Tortuous thoracic aorta. Atherosclerotic calcifications. No pleural effusion. No pneumothorax. No acute pleuroparenchymal abnormality. IMPRESSION: No acute cardiopulmonary abnormality. Electronically Signed   By: Meda Klinefelter M.D.   On: 07/08/2023 08:48    Scheduled Meds:  aspirin EC  81 mg Oral Daily   atenolol  12.5 mg Oral Daily   diclofenac Sodium  4 g Topical QID   DULoxetine  60 mg Oral Daily   ezetimibe  10 mg Oral Daily   gabapentin  400 mg Oral TID   ipratropium-albuterol  3 mL Nebulization Q4H   oseltamivir  30 mg  Oral BID   pantoprazole  40 mg Oral Daily   rosuvastatin  40 mg Oral QPM   Continuous Infusions:  cefTRIAXone (ROCEPHIN)  IV       LOS: 0 days   Total time spent interpreting labs and vitals, coordinating care amongst consultants and care team members, directly assessing and discussing care with the patient and/or family: 61 min   Debarah Crape, DO [facility]  To contact the attending physician between 7A-7P please use Epic Chat. To contact the covering physician during after hours 7P-7A, please review Amion.   07/09/2023, 7:59 AM   *This document has been created with the assistance of dictation software. Please excuse typographical errors. *

## 2023-07-09 NOTE — Evaluation (Addendum)
 Occupational Therapy Evaluation Patient Details Name: [name] MRN: [id number] DOB: 05-22-1933 Today's Date: 07/09/2023   History of Present Illness   89 y.o. female worsening cough, congestion, shortness of breath with exertion over the past few days, Hypoxia secondary to flu A, UTI, Thrombocytopenia.  PMHx:  Acute on chronic respiratory failure with hypoxia, afib, dementia, DM, HTN, OA, RA, B TKA, R THA, history of CVA     Clinical Impressions Pt c/o generally feeling unwell in bed, some pain in B knees with activity. Pt lives at home with two sons who assists as needed 24/7, PCA 5X/wk 11-4, PLOF ambulates short distances with RW, needs help in/out of bed, has w/c and ramp for community. Pt has hospital bed, RW, w/c, lift chair, shower chair, no further equipment needs. Pt at this time is close to baseline, fearful of falling but able to complete bed mobility and transfer with increased time and min A using RW. Pt O2 sats remained above 94 on room air throughout eval, BP 121/89 sitting EOB when Pt reported mild dizziness.  Pt would benefit from [facility] follow up to maximize safety and independence in home, will continue to follow acutely to progress as able.      If plan is discharge home, recommend the following:   A lot of help with walking and/or transfers;A lot of help with bathing/dressing/bathroom;Assistance with cooking/housework;Assist for transportation;Help with stairs or ramp for entrance     Functional Status Assessment   Patient has had a recent decline in their functional status and demonstrates the ability to make significant improvements in function in a reasonable and predictable amount of time.     Equipment Recommendations   None recommended by OT     Recommendations for Other Services         Precautions/Restrictions   Precautions Precautions: Fall Restrictions Weight Bearing Restrictions Per Provider Order: No     Mobility Bed  Mobility Overal bed mobility: Needs Assistance Bed Mobility: Supine to Sit     Supine to sit: Min assist     General bed mobility comments: min A, increased time to scoot to EOB    Transfers Overall transfer level: Needs assistance Equipment used: Rolling walker (2 wheels) Transfers: Sit to/from Stand, Bed to chair/wheelchair/BSC Sit to Stand: Min assist     Step pivot transfers: Min assist     General transfer comment: min A for STS and transfer to recliner, Pt fearful of falling but overall performs task well.      Balance Overall balance assessment: Needs assistance Sitting-balance support: No upper extremity supported, Feet supported Sitting balance-Leahy Scale: Fair     Standing balance support: Bilateral upper extremity supported, During functional activity, Reliant on assistive device for balance Standing balance-Leahy Scale: Poor Standing balance comment: Pt reliant on RW for support, min A for standing, fearful of falling                           ADL either performed or assessed with clinical judgement   ADL Overall ADL's : Needs assistance/impaired Eating/Feeding: Independent   Grooming: Set up;Sitting   Upper Body Bathing: Minimal assistance;Sitting   Lower Body Bathing: Moderate assistance;Sitting/lateral leans   Upper Body Dressing : Minimal assistance;Sitting   Lower Body Dressing: Moderate assistance;Sit to/from stand   Toilet Transfer: Minimal assistance;Rolling walker (2 wheels);BSC/3in1   Toileting- Clothing Manipulation and Hygiene: Moderate assistance;Sitting/lateral lean       Functional mobility during ADLs:  Minimal assistance;Rolling walker (2 wheels);+2 for safety/equipment General ADL Comments: Pt close to baseline, min-mod A for dressing/bathing, min A for STS and stand pivot transfer to recliner using RW for support.     Vision Baseline Vision/History: 1 Wears glasses Ability to See in Adequate Light: 0  Adequate Patient Visual Report: No change from baseline       Perception         Praxis         Pertinent Vitals/Pain Pain Assessment Pain Assessment: 0-10 Pain Score: 5  Pain Location: B knees with activity Pain Descriptors / Indicators: Aching Pain Intervention(s): Monitored during session     Extremity/Trunk Assessment Upper Extremity Assessment Upper Extremity Assessment: Overall WFL for tasks assessed;LUE deficits/detail LUE Deficits / Details: mild-mod decreased FM to L hand due to stiffness, strength is good. LUE Sensation: WNL LUE Coordination: decreased fine motor   Lower Extremity Assessment Lower Extremity Assessment: Defer to PT evaluation       Communication Communication Communication: Impaired Factors Affecting Communication: Hearing impaired   Cognition Arousal: Alert Behavior During Therapy: WFL for tasks assessed/performed Cognition: No apparent impairments             OT - Cognition Comments: A/Ox4                 Following commands: Intact       Cueing  General Comments          Exercises     Shoulder Instructions      Home Living Family/patient expects to be discharged to:: Private residence Living Arrangements: Children Available Help at Discharge: Family;Available 24 hours/day Type of Home: Mobile home Home Access: Ramped entrance     Home Layout: One level     Bathroom Shower/Tub: Chief Strategy Officer: Handicapped height Bathroom Accessibility: Yes   Home Equipment: Grab bars - toilet;Grab bars - tub/shower;Tub bench;Rolling Walker (2 wheels);Wheelchair - manual;Hospital bed;Lift chair   Additional Comments: Pt lives with 2 sons who are available all day, PCA M-F 11-4 to help with cooking/cleaning/bathing.      Prior Functioning/Environment Prior Level of Function : Needs assist             Mobility Comments: uses RW, doesn't go out much but does use w/c for doctor  appointments. ADLs Comments: PCA helps with cooking/cleaning and bathing, uses    OT Problem List: Decreased strength;Decreased range of motion;Decreased activity tolerance;Impaired balance (sitting and/or standing);Pain;Impaired UE functional use   OT Treatment/Interventions: Self-care/ADL training;Therapeutic exercise;Energy conservation;DME and/or AE instruction;Therapeutic activities;Patient/family education;Balance training      OT Goals(Current goals can be found in the care plan section)   Acute Rehab OT Goals Patient Stated Goal: to return home OT Goal Formulation: With patient Time For Goal Achievement: 07/23/23 Potential to Achieve Goals: Good   OT Frequency:  Min 2X/week    Co-evaluation PT/OT/SLP Co-Evaluation/Treatment: Yes Reason for Co-Treatment: Complexity of the patient's impairments (multi-system involvement);For patient/therapist safety;To address functional/ADL transfers   OT goals addressed during session: ADL's and self-care;Proper use of Adaptive equipment and DME      AM-PAC OT "6 Clicks" Daily Activity     Outcome Measure Help from another person eating meals?: None Help from another person taking care of personal grooming?: A Little Help from another person toileting, which includes using toliet, bedpan, or urinal?: A Lot Help from another person bathing (including washing, rinsing, drying)?: A Lot Help from another person to put on and taking off regular upper  body clothing?: A Little Help from another person to put on and taking off regular lower body clothing?: A Lot 6 Click Score: 16   End of Session Equipment Utilized During Treatment: Gait belt;Rolling walker (2 wheels) Nurse Communication: Mobility status  Activity Tolerance: Patient tolerated treatment well Patient left: in chair;with call bell/phone within reach;with chair alarm set  OT Visit Diagnosis: Unsteadiness on feet (R26.81);Other abnormalities of gait and mobility (R26.89);Muscle  weakness (generalized) (M62.81);Pain Pain - part of body: Knee                Time: 1226-1300 OT Time Calculation (min): 34 min Charges:  OT General Charges $OT Visit: 1 Visit OT Evaluation $OT Eval Moderate Complexity: 1 [address], OTR/L   Alexis Goodell 07/09/2023, 1:26 PM

## 2023-07-09 NOTE — Care Management Obs Status (Signed)
 MEDICARE OBSERVATION STATUS NOTIFICATION   Patient Details  Name: Caitlan Chauca MRN: [id number] Date of Birth: 10-Dec-1933   Medicare Observation Status Notification Given:  Yes    Halton Neas G., RN 07/09/2023, 8:45 AM

## 2023-07-09 NOTE — Plan of Care (Signed)
  Problem: Clinical Measurements: Goal: Ability to maintain clinical measurements within normal limits will improve Outcome: Progressing   Problem: Clinical Measurements: Goal: Will remain free from infection Outcome: Progressing   Problem: Clinical Measurements: Goal: Diagnostic test results will improve Outcome: Progressing   Problem: Clinical Measurements: Goal: Diagnostic test results will improve Outcome: Progressing   Problem: Clinical Measurements: Goal: Will remain free from infection Outcome: Progressing   Problem: Clinical Measurements: Goal: Ability to maintain clinical measurements within normal limits will improve Outcome: Progressing   Problem: Clinical Measurements: Goal: Cardiovascular complication will be avoided Outcome: Progressing   Problem: Activity: Goal: Risk for activity intolerance will decrease Outcome: Progressing   Problem: Elimination: Goal: Will not experience complications related to bowel motility Outcome: Progressing

## 2023-07-09 NOTE — Evaluation (Signed)
 Physical Therapy Evaluation Patient Details Name: [name] MRN: [id number] DOB: 12-02-1933 Today's Date: 07/09/2023  History of Present Illness  89 y.o. female admitted 07/08/23 with worsening cough, congestion, shortness of breath with exertion over the past few days, Hypoxia secondary to flu A, UTI, Thrombocytopenia.  PMHx:  Acute on chronic respiratory failure with hypoxia, afib, dementia, DM, HTN, OA, RA, B TKA, R THA, history of CVA  Clinical Impression  Patient presents with decreased mobility due to generalized weakness, decreased activity tolerance with c/o light headedness on EOB (though HR/BP stable), and decreased balance.  She was previously ambulating to bathroom with son assist using her walker.  She used wheelchair when going out and has aide daily through the week to help with showering.  Feel she will benefit from skilled PT in the acute setting and from follow up HHPT at d/c.       If plan is discharge home, recommend the following: A little help with walking and/or transfers;A little help with bathing/dressing/bathroom;Assistance with cooking/housework;Assist for transportation;Help with stairs or ramp for entrance   Can travel by private vehicle        Equipment Recommendations None recommended by PT  Recommendations for Other Services       Functional Status Assessment Patient has had a recent decline in their functional status and demonstrates the ability to make significant improvements in function in a reasonable and predictable amount of time.     Precautions / Restrictions Precautions Precautions: Fall Precaution/Restrictions Comments: watch O2      Mobility  Bed Mobility Overal bed mobility: Needs Assistance Bed Mobility: Supine to Sit     Supine to sit: Min assist, HOB elevated     General bed mobility comments: min A, increased time to scoot to EOB    Transfers Overall transfer level: Needs assistance Equipment used: Rolling walker (2  wheels) Transfers: Sit to/from Stand, Bed to chair/wheelchair/BSC Sit to Stand: Min assist   Step pivot transfers: Min assist       General transfer comment: min A +2 (for safety) for STS and transfer to recliner, Pt fearful of falling but overall performs task well.    Ambulation/Gait               General Gait Details: declined to ambulate stating she felt too light headed  Careers information officer     Tilt Bed    Modified Rankin (Stroke Patients Only)       Balance Overall balance assessment: Needs assistance Sitting-balance support: No upper extremity supported, Feet supported Sitting balance-Leahy Scale: Fair     Standing balance support: Bilateral upper extremity supported, During functional activity, Reliant on assistive device for balance Standing balance-Leahy Scale: Poor Standing balance comment: Pt reliant on RW for support, min A for standing, fearful of falling                             Pertinent Vitals/Pain Pain Assessment Pain Assessment: 0-10 Pain Score: 5  Pain Location: B knees with activity Pain Descriptors / Indicators: Aching Pain Intervention(s): Monitored during session    Home Living Family/patient expects to be discharged to:: Private residence Living Arrangements: Children Available Help at Discharge: Family;Available 24 hours/day;Personal care attendant Type of Home: Mobile home Home Access: Ramped entrance       Home Layout: One level Home Equipment: Grab bars - toilet;Grab bars -  tub/shower;Tub bench;Rolling Walker (2 wheels);Wheelchair - manual;Hospital bed;Lift chair Additional Comments: Pt lives with 2 sons who are available all day, PCA M-F 11-4 to help with cooking/cleaning/bathing.    Prior Function Prior Level of Function : Needs assist             Mobility Comments: uses RW, doesn't go out much but does use w/c for doctor appointments. ADLs Comments: PCA helps with  cooking/cleaning and bathing, uses     Extremity/Trunk Assessment   Upper Extremity Assessment Upper Extremity Assessment: Defer to OT evaluation LUE Deficits / Details: mild-mod decreased FM to L hand due to stiffness, strength is good. LUE Sensation: WNL LUE Coordination: decreased fine motor    Lower Extremity Assessment Lower Extremity Assessment: Generalized weakness;LLE deficits/detail LLE Deficits / Details: reports limited knee ROM with arthritis and cannot put on her sock    Cervical / Trunk Assessment Cervical / Trunk Assessment: Kyphotic  Communication   Communication Communication: Impaired Factors Affecting Communication: Hearing impaired    Cognition Arousal: Alert Behavior During Therapy: WFL for tasks assessed/performed   PT - Cognitive impairments: Safety/Judgement, Problem solving                       PT - Cognition Comments: slow processing and fearful of falling, needing encouragement (likely baseline) Following commands: Intact       Cueing       General Comments General comments (skin integrity, edema, etc.): using pure wick and some brief incontinence with sit to stand, c/o light headed, but BP stable while on EOB 130's/50's  She was on RA throughout with SpO2 92% or greater throughout    Exercises     Assessment/Plan    PT Assessment Patient needs continued PT services  PT Problem List Decreased strength;Decreased activity tolerance;Decreased balance;Decreased knowledge of use of DME;Decreased mobility;Cardiopulmonary status limiting activity       PT Treatment Interventions DME instruction;Therapeutic exercise;Gait training;Balance training;Functional mobility training;Therapeutic activities;Patient/family education    PT Goals (Current goals can be found in the Care Plan section)  Acute Rehab PT Goals Patient Stated Goal: return home PT Goal Formulation: With patient Time For Goal Achievement: 07/23/23 Potential to Achieve  Goals: Fair    Frequency Min 2X/week     Co-evaluation PT/OT/SLP Co-Evaluation/Treatment: Yes Reason for Co-Treatment: For patient/therapist safety;To address functional/ADL transfers PT goals addressed during session: Mobility/safety with mobility;Balance OT goals addressed during session: ADL's and self-care;Proper use of Adaptive equipment and DME       AM-PAC PT "6 Clicks" Mobility  Outcome Measure Help needed turning from your back to your side while in a flat bed without using bedrails?: A Little Help needed moving from lying on your back to sitting on the side of a flat bed without using bedrails?: A Little Help needed moving to and from a bed to a chair (including a wheelchair)?: A Little Help needed standing up from a chair using your arms (e.g., wheelchair or bedside chair)?: A Little Help needed to walk in hospital room?: Total Help needed climbing 3-5 steps with a railing? : Total 6 Click Score: 14    End of Session Equipment Utilized During Treatment: Gait belt Activity Tolerance: Patient limited by fatigue Patient left: in chair;with call bell/phone within reach;with chair alarm set Nurse Communication: Mobility status PT Visit Diagnosis: Muscle weakness (generalized) (M62.81);Other abnormalities of gait and mobility (R26.89)    Time: 1226-1300 PT Time Calculation (min) (ACUTE ONLY): 34 min   Charges:  PT Evaluation $PT Eval Moderate Complexity: 1 Mod   PT General Charges $$ ACUTE PT VISIT: 1 Visit         Sheran Lawless, PT Acute Rehabilitation Services Office:[phone] 07/09/2023   Elray Mcgregor 07/09/2023, 2:43 PM

## 2023-07-10 ENCOUNTER — Other Ambulatory Visit (HOSPITAL_COMMUNITY): Payer: Self-pay

## 2023-07-10 DIAGNOSIS — I714 Abdominal aortic aneurysm, without rupture, unspecified: Secondary | ICD-10-CM | POA: Diagnosis not present

## 2023-07-10 DIAGNOSIS — I482 Chronic atrial fibrillation, unspecified: Secondary | ICD-10-CM | POA: Diagnosis not present

## 2023-07-10 DIAGNOSIS — J101 Influenza due to other identified influenza virus with other respiratory manifestations: Secondary | ICD-10-CM | POA: Diagnosis not present

## 2023-07-10 DIAGNOSIS — N1831 Chronic kidney disease, stage 3a: Secondary | ICD-10-CM | POA: Diagnosis not present

## 2023-07-10 LAB — COMPREHENSIVE METABOLIC PANEL
ALT: 18 U/L (ref 0–44)
AST: 24 U/L (ref 15–41)
Albumin: 2.7 g/dL — ABNORMAL LOW (ref 3.5–5.0)
Alkaline Phosphatase: 46 U/L (ref 38–126)
Anion gap: 11 (ref 5–15)
BUN: 17 mg/dL (ref 8–23)
CO2: 26 mmol/L (ref 22–32)
Calcium: 8.6 mg/dL — ABNORMAL LOW (ref 8.9–10.3)
Chloride: 99 mmol/L (ref 98–111)
Creatinine, Ser: 1.06 mg/dL — ABNORMAL HIGH (ref 0.44–1.00)
GFR, Estimated: 51 mL/min — ABNORMAL LOW (ref >60)
Glucose, Bld: 81 mg/dL (ref 70–99)
Potassium: 4.3 mmol/L (ref 3.5–5.1)
Sodium: 136 mmol/L (ref 135–145)
Total Bilirubin: 1 mg/dL (ref 0.0–1.2)
Total Protein: 5.7 g/dL — ABNORMAL LOW (ref 6.5–8.1)

## 2023-07-10 LAB — MAGNESIUM: Magnesium: 1.6 mg/dL — ABNORMAL LOW (ref 1.7–2.4)

## 2023-07-10 LAB — CBC WITH DIFFERENTIAL/PLATELET
Abs Immature Granulocytes: 0.02 10*3/uL (ref 0.00–0.07)
Basophils Absolute: 0.1 10*3/uL (ref 0.0–0.1)
Basophils Relative: 1 %
Eosinophils Absolute: 0.2 10*3/uL (ref 0.0–0.5)
Eosinophils Relative: 4 %
HCT: 39.7 % (ref 36.0–46.0)
Hemoglobin: 12.9 g/dL (ref 12.0–15.0)
Immature Granulocytes: 0 %
Lymphocytes Relative: 24 %
Lymphs Abs: 1.1 10*3/uL (ref 0.7–4.0)
MCH: 29.1 pg (ref 26.0–34.0)
MCHC: 32.5 g/dL (ref 30.0–36.0)
MCV: 89.4 fL (ref 80.0–100.0)
Monocytes Absolute: 0.5 10*3/uL (ref 0.1–1.0)
Monocytes Relative: 12 %
Neutro Abs: 2.7 10*3/uL (ref 1.7–7.7)
Neutrophils Relative %: 59 %
Platelets: 103 10*3/uL — ABNORMAL LOW (ref 150–400)
RBC: 4.44 MIL/uL (ref 3.87–5.11)
RDW: 14.2 % (ref 11.5–15.5)
WBC: 4.6 10*3/uL (ref 4.0–10.5)
nRBC: 0 % (ref 0.0–0.2)

## 2023-07-10 LAB — PHOSPHORUS: Phosphorus: 2.5 mg/dL (ref 2.5–4.6)

## 2023-07-10 MED ORDER — OSELTAMIVIR PHOSPHATE 30 MG PO CAPS
30.0000 mg | ORAL_CAPSULE | Freq: Two times a day (BID) | ORAL | 0 refills | Status: AC
  Filled 2023-07-10: qty 4, 2d supply, fill #0

## 2023-07-10 NOTE — Discharge Summary (Signed)
 Physician Discharge Summary   Patient: [name] MRN: [id number] DOB: Mar 24, 1934  Admit date:     07/08/2023  Discharge date: 07/10/23  Discharge Physician: Debarah Crape   PCP: Donita Brooks, MD   Recommendations at discharge:    Follow up with PCP to check plts. Follow up with Vascular Surgery for [name] and SMAS monitoring  Discharge Diagnoses: Principal Problem:   Influenza A Active Problems:   Atrial fibrillation, chronic (HCC)   Stage 3a chronic kidney disease (CKD) (HCC)   [name] (abdominal aortic aneurysm) (HCC)  Resolved Problems:   * No resolved hospital problems. *  Hospital Course: [name] is 89 y.o. female with HTN, [name], diastolic HF, history of CVA, CKD, Afib not on AC due to frequent falls, presents with cough, congestion, shortness of breath. Patient was found to Klebsiella UTI on outpatient urine culture from 3/4 and was prescribed Keflex.  She completed 3 days of therapy prior to arrival and completed treatment while here.  In the ED she was tachycardic with elevated blood pressure, hypoxia which improved on 2liters nasal cannula, and she tested positive for influenza A.  CBC was without leukocytosis but did reveal a new thrombocytopenia to 95.  Chest x-ray without focal consolidation.  CT abdomen showed enlarging infrarenal [name] measuring 6 x 4.4 cm.  Follow-up CTA abdomen revealed fusiform infrarenal [name] minimally increasing compared to scan from 2022. Vascular Surgery was consulted and recommended outpatient management.  Patient was admitted for hypoxia secondary to influenza A. Patient was easily able to wean to room air within 24 hours after admission.  By reevaluation on 3/10 patient reported feeling back to her physiologic baseline and ready to discharge home.  Home health physical therapy and Occupational Therapy were arranged for the patient at discharge. Care plan was discussed with the patient, as well as her son, [name] at bedside.   Acute  hypoxic respiratory failure secondary to influenza A - Tachypneic on arrival, desatting to 86% on room air. - O2 weaned, now on Room Air - Due to wheezing on arrival she was also started on DuoNebs - Continue Tamiflu x 5 days - Symptomatic support with Mucinex DM - Incentive spirometry and FV - PT/OT   Klebsiella UTI - Seen on outpatient culture 3/4 - Has been on Keflex - Has now completed 5-day course   Thrombocytopenia - Platelets 95 on arrival, improving - Likely secondary to influenza - SCDs for DVT prophylaxis. - Trend CBC, repeat with PCP outpatient.   CKD stage IIIa - Baseline creatinine 1.1, creatinine 1.17 on arrival. Improved beyond baseline now. - Continue to avoid nephrotoxic meds - Renally dosed with a creatinine clearance of 34 when needed   Chronic atrial fibrillation - Not on anticoagulation outpatient due to frequent falls - Initially tachycardic in the ED but this is improved.  Currently rate controlled - Continue aspirin and atenolol   [name] superior mesenteric artery stenosis - Follows with cardiology output - Imaging this admission shows increased size as well incidental finding of superior mesenteric artery stenosis - Patient is asymptomatic - Vascular surgery consulted by EDP and recommends outpatient follow-up, referral placed. -per vascular surgery patient is a poor surgical candidate and operative risk is very high.   History of CVA Hyperlipidemia - Continue statin, aspirin, Zetia   Chronic knee pain secondary to osteoarthritis Chronic low back pain - Continue Voltaren, gabapentin and cymbalta   GERD - PPI  Consultants: Vascular Surgery Procedures performed: n/a  Disposition: Home with [facility]  PT/OT Diet recommendation:  Discharge Diet Orders (From admission, onward)     Start     Ordered   07/10/23 0000  Diet general        07/10/23 1245           Cardiac diet DISCHARGE MEDICATION: Allergies as of 07/10/2023       Reactions    Sulfonamide Derivatives Hives, Rash        Medication List     STOP taking these medications    cephALEXin 500 MG capsule Commonly known as: KEFLEX   CORICIDIN HBP PO       TAKE these medications    albuterol 108 (90 Base) MCG/ACT inhaler Commonly known as: VENTOLIN HFA Inhale 2 puffs into the lungs every 4 (four) hours as needed for wheezing or shortness of breath.   aspirin EC 81 MG tablet Take 1 tablet (81 mg total) by mouth daily.   atenolol 25 MG tablet Commonly known as: TENORMIN TAKE ONE-HALF TABLET BY MOUTH DAILY   Calcium Carbonate-Vitamin D3 600-400 MG-UNIT Tabs Take 1 tablet by mouth daily.   clotrimazole-betamethasone cream Commonly known as: LOTRISONE Apply 1 Application topically daily as needed (fungal infection).   DULoxetine 30 MG capsule Commonly known as: CYMBALTA Take 1 capsule (30 mg total) by mouth daily. What changed: additional instructions   DULoxetine 60 MG capsule Commonly known as: CYMBALTA Take 1 capsule (60 mg total) by mouth daily. Take in addition to the Cymbalta 30 mg to equal a total dose of 90 mg per day. What changed: Another medication with the same name was changed. Make sure you understand how and when to take each.   ezetimibe 10 MG tablet Commonly known as: ZETIA TAKE 1 TABLET (10 MG TOTAL) BY MOUTH DAILY.   gabapentin 800 MG tablet Commonly known as: NEURONTIN TAKE 1 TABLET BY MOUTH 3 TIMES DAILY   ketoconazole 2 % cream Commonly known as: NIZORAL Apply 1 Application topically 2 (two) times daily.   meloxicam 7.5 MG tablet Commonly known as: MOBIC TAKE 1 TABLET BY MOUTH EVERY DAY   oseltamivir 30 MG capsule Commonly known as: TAMIFLU Take 1 capsule (30 mg total) by mouth 2 (two) times daily for 2 days.   pantoprazole 40 MG tablet Commonly known as: PROTONIX TAKE 1 TABLET BY MOUTH ONCE DAILY   rosuvastatin 40 MG tablet Commonly known as: CRESTOR TAKE 1 TABLET BY MOUTH IN THE EVENING         Follow-up Information     Victorino Sparrow, MD Follow up in 4 week(s).   Specialty: Vascular Surgery Why: Office will call you to arrange your appt (sent) Contact information: [address] [place] [address] [phone]                Discharge Exam: Filed Weights   07/08/23 0730 07/09/23 0454 07/10/23 0422  Weight: 76.6 kg 79.9 kg 79.9 kg   Constitutional:  Normal appearance. Non toxic-appearing.  HENT: Head Normocephalic and atraumatic.  Mucous membranes are moist.  Eyes:  Extraocular intact. Conjunctivae normal. Pupils are equal, round, and reactive to light.  Cardiovascular: Rate and Rhythm: Normal rate and regular rhythm.  Pulmonary: Non labored, symmetric rise of chest wall. Upper airway rales, +occasional cough, on room air.  Musculoskeletal:  Normal range of motion.  Skin: warm and dry. not jaundiced.  Neurological: No focal deficit present. alert. Oriented. Psychiatric: Mood and Affect congruent.    Condition at discharge: stable  The results of significant diagnostics  from this hospitalization (including imaging, microbiology, ancillary and laboratory) are listed below for reference.   Imaging Studies: CT Angio Abd/Pel W and/or Wo Contrast Result Date: 07/08/2023 CLINICAL DATA:  Enlarging infrarenal abdominal aortic aneurysm on routine CT from earlier today. EXAM: CTA ABDOMEN AND PELVIS WITHOUT AND WITH CONTRAST TECHNIQUE: Multidetector CT imaging of the abdomen and pelvis was performed using the standard protocol during bolus administration of intravenous contrast. Multiplanar reconstructed images and MIPs were obtained and reviewed to evaluate the vascular anatomy. RADIATION DOSE REDUCTION: This exam was performed according to the departmental dose-optimization program which includes automated exposure control, adjustment of the mA and/or kV according to patient size and/or use of iterative reconstruction technique. CONTRAST:  75mL OMNIPAQUE IOHEXOL 350 MG/ML  SOLN COMPARISON:  Three hundred twenty-five CT abdomen/pelvis FINDINGS: VASCULAR Aorta: Tortuous atherosclerotic abdominal aorta with fusiform infrarenal abdominal aortic aneurysm measuring 4.4 cm AP x 5.4 cm TRANSVERSE maximum diameter on sagittal series 10/image 127, previously 4.2 cm AP x 5.0 cm TRANSVERSE maximum diameter on 02/13/2021 CT abdomen/pelvis study using similar measurement technique, minimally increased. Moderate eccentric mural plaque within the aneurysm. No acute dissection. No acute surrounding fat stranding. No acute penetrating atherosclerotic ulcer. Celiac: Approximately 60-70% ostial stenosis. SMA: Near occlusion proximally, likely greater than 90% stenosis. Renals: Moderate proximal stenoses in the bilateral renal arteries. IMA: Multifocal mild-to-moderate stenoses. Inflow: No significant stenosis.  No dissection or aneurysm. Proximal Outflow: Bilateral common femoral and visualized portions of the superficial and profunda femoral arteries are patent without evidence of aneurysm, dissection, vasculitis or significant stenosis. Veins: No acute abnormality on this arterial phase scan. Review of the MIP images confirms the above findings. NON-VASCULAR Lower chest: Coronary atherosclerosis. No acute abnormality at the lung bases. Hepatobiliary: Mildly lobulated liver surface, cannot exclude cirrhosis. No liver masses. Cholecystectomy. Bile ducts are stable and within normal post cholecystectomy limits with CBD diameter 8 mm. Pancreas: Normal, with no mass or duct dilation. Spleen: Normal size. No mass. Adrenals/Urinary Tract: Normal adrenals. Simple 2.1 cm lower right renal cyst, for which no follow-up imaging is recommended. No hydronephrosis. Normal bladder. Stomach/Bowel: Normal non-distended stomach. Normal caliber small bowel with no small bowel wall thickening. Appendix not discretely visualized. Marked left colonic diverticulosis with no large bowel wall thickening or significant  pericolonic fat stranding. Vascular/Lymphatic: No pathologically enlarged lymph nodes in the abdomen or pelvis. Reproductive: Status post hysterectomy, with no abnormal findings at the vaginal cuff. No adnexal mass. Other: No pneumoperitoneum, ascites or focal fluid collection. Musculoskeletal: No aggressive appearing focal osseous lesions. Left total hip arthroplasty. Chronic severe T12 and moderate L5 vertebral compression fractures. Marked thoracolumbar spondylosis. Diffuse osteopenia. IMPRESSION: 1. Tortuous atherosclerotic abdominal aorta with fusiform infrarenal abdominal aortic aneurysm measuring 4.4 cm AP x 5.4 cm transverse maximum diameter, minimally increased from 4.2 x 5.0 cm since 02/13/2021 CT abdomen/pelvis study using similar measurement technique. Recommend follow-up CT or MR as appropriate in 6 months and referral to or continued care with vascular specialist. (Ref.: J Vasc Surg. 2018; 67:2-77 and J Am Coll Radiol 2013;10(10):789-794.) 2. 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 3. 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 4. Moderate proximal stenoses in the bilateral renal arteries. 5. Multifocal mild-to-moderate stenoses in the IMA. 6. Marked left colonic diverticulosis. 7. Mildly lobulated liver surface, cannot exclude cirrhosis. No liver masses. 8. Chronic severe T12 and moderate L5 vertebral compression fractures. Diffuse osteopenia. Electronically Signed   By: Delbert Phenix M.D.   On: 07/08/2023 11:58  CT ABDOMEN PELVIS W CONTRAST Result Date: 07/08/2023 CLINICAL DATA:  Recent diagnosis of UTI. On antibiotics but having pain with urination. EXAM: CT ABDOMEN AND PELVIS WITH CONTRAST TECHNIQUE: Multidetector CT imaging of the abdomen and pelvis was performed using the standard protocol following bolus administration of intravenous contrast. RADIATION DOSE REDUCTION: This exam was performed according to the departmental dose-optimization program  which includes automated exposure control, adjustment of the mA and/or kV according to patient size and/or use of iterative reconstruction technique. CONTRAST:  80mL OMNIPAQUE IOHEXOL 300 MG/ML  SOLN COMPARISON:  Abdomen pelvis CT 02/13/2021. FINDINGS: Lower chest: Heart is enlarged. Coronary artery calcifications are seen. Small hiatal hernia. Breathing motion at the bases. Hepatobiliary: No focal liver abnormality is seen. No gallstones, gallbladder wall thickening, or biliary dilatation. Pancreas: Unremarkable. No pancreatic ductal dilatation or surrounding inflammatory changes. Spleen: Normal in size without focal abnormality. Adrenals/Urinary Tract: The adrenal glands are preserved. Mild renal atrophy. No collecting system dilatation. Preserved parenchymal enhancement. The ureters have normal course and caliber extending down to the urinary bladder. Bladder is underdistended. Bosniak 1 lower pole right-sided renal cyst identified measuring 2.6 cm with Hounsfield unit 5. No specific follow-up. Few parapelvic left-sided renal cysts also suggested. Punctate nonobstructing lower pole renal stones. Stomach/Bowel: Stomach is mildly distended with air. Small bowel has a normal course and caliber. Large bowel is normal course and caliber with scattered colonic stool. Extensive left-sided colonic diverticulosis including the descending and sigmoid colon. The appendix is not well seen in the right lower quadrant but no pericecal stranding or fluid. Vascular/Lymphatic: Extensive vascular calcifications identified. Once again there is a abdominal aortic aneurysm with significant mural plaque and thrombus. Dimension today on series 2, image 26 measures 6.0 by 4.4 cm. On the prior when measured in same fashion is today this would have measured 5.1 x 4.3 cm, increasing in size. Significant calcified plaque along branch vessels including celiac and SMA. The SMA is occluded proximally. There is significant stenosis along the  celiac. The IMA as calcified plaque and is slightly enlarged compared to the expected. Is also significant calcified plaque along the renal artery origins. Please correlate for level of hypertension. Normal caliber IVC. No specific abnormal lymph node enlargement identified in the abdomen and pelvis. Reproductive: Status post hysterectomy. No adnexal masses. Other: No free intra-air or free fluid. Musculoskeletal: Streak artifact from left hip arthroplasty. Diffuse osteopenia. Moderate degenerative changes seen along the spine and pelvis. Multilevel stenosis. Also multilevel compression deformities along the spine these are similar to previous examination cleaning severe at T12 and mild at L5. IMPRESSION: Preserved renal enhancement. No inflammatory changes. Benign renal cysts. Punctate nonobstructing renal stones. No collecting system dilatation. Sigmoid colon and descending colon diverticulosis without obstruction. Enlarging infrarenal abdominal aortic aneurysm now measuring up to 6.0 x 4.4 cm. Recommend vascular surgery consultation. In addition there are several areas of significant calcified plaque along branch vessels. There is occlusion of the SMA. Significant stenosis of the celiac, renal arteries. Ectatic inferior mesenteric artery. Enlarged heart.  Coronary artery calcifications. Stable compression deformities along the spine.  Severe osteopenia Motion Electronically Signed   By: Karen Kays M.D.   On: 07/08/2023 10:31   DG Chest Port 1 View Result Date: 07/08/2023 CLINICAL DATA:  Questionable sepsis - evaluate for abnormality EXAM: PORTABLE CHEST 1 VIEW COMPARISON:  May 08, 2023 FINDINGS: The cardiomediastinal silhouette is unchanged and enlarged in contour.Tortuous thoracic aorta. Atherosclerotic calcifications. No pleural effusion. No pneumothorax. No acute pleuroparenchymal abnormality. IMPRESSION: No acute  cardiopulmonary abnormality. Electronically Signed   By: Meda Klinefelter M.D.   On:  07/08/2023 08:48    Microbiology: Results for orders placed or performed during the hospital encounter of 07/08/23  Resp panel by RT-PCR (RSV, Flu A&B, Covid) Anterior Nasal Swab     Status: Abnormal   Collection Time: 07/08/23  7:33 AM   Specimen: Anterior Nasal Swab  Result Value Ref Range Status   SARS Coronavirus 2 by RT PCR NEGATIVE NEGATIVE Final    Comment: (NOTE) SARS-CoV-2 target nucleic acids are NOT DETECTED.  The SARS-CoV-2 RNA is generally detectable in upper respiratory specimens during the acute phase of infection. The lowest concentration of SARS-CoV-2 viral copies this assay can detect is 138 copies/mL. A negative result does not preclude SARS-Cov-2 infection and should not be used as the sole basis for treatment or other patient management decisions. A negative result may occur with  improper specimen collection/handling, submission of specimen other than nasopharyngeal swab, presence of viral mutation(s) within the areas targeted by this assay, and inadequate number of viral copies(<138 copies/mL). A negative result must be combined with clinical observations, patient history, and epidemiological information. The expected result is Negative.  Fact Sheet for Patients:  BloggerCourse.com  Fact Sheet for Healthcare Providers:  SeriousBroker.it  This test is no t yet approved or cleared by the [place] FDA and  has been authorized for detection and/or diagnosis of SARS-CoV-2 by FDA under an Emergency Use Authorization (EUA). This EUA will remain  in effect (meaning this test can be used) for the duration of the COVID-19 declaration under Section 564(b)(1) of the Act, 21 U.S.C.section 360bbb-3(b)(1), unless the authorization is terminated  or revoked sooner.       Influenza A by PCR POSITIVE (A) NEGATIVE Final   Influenza B by PCR NEGATIVE NEGATIVE Final    Comment: (NOTE) The Xpert Xpress  SARS-CoV-2/FLU/RSV plus assay is intended as an aid in the diagnosis of influenza from Nasopharyngeal swab specimens and should not be used as a sole basis for treatment. Nasal washings and aspirates are unacceptable for Xpert Xpress SARS-CoV-2/FLU/RSV testing.  Fact Sheet for Patients: BloggerCourse.com  Fact Sheet for Healthcare Providers: SeriousBroker.it  This test is not yet approved or cleared by the [place] FDA and has been authorized for detection and/or diagnosis of SARS-CoV-2 by FDA under an Emergency Use Authorization (EUA). This EUA will remain in effect (meaning this test can be used) for the duration of the COVID-19 declaration under Section 564(b)(1) of the Act, 21 U.S.C. section 360bbb-3(b)(1), unless the authorization is terminated or revoked.     Resp Syncytial Virus by PCR NEGATIVE NEGATIVE Final    Comment: (NOTE) Fact Sheet for Patients: BloggerCourse.com  Fact Sheet for Healthcare Providers: SeriousBroker.it  This test is not yet approved or cleared by the [place] FDA and has been authorized for detection and/or diagnosis of SARS-CoV-2 by FDA under an Emergency Use Authorization (EUA). This EUA will remain in effect (meaning this test can be used) for the duration of the COVID-19 declaration under Section 564(b)(1) of the Act, 21 U.S.C. section 360bbb-3(b)(1), unless the authorization is terminated or revoked.  Performed at [facility], [address] [place], [place] [address]   Blood Culture (routine x 2)     Status: None (Preliminary result)   Collection Time: 07/08/23  8:00 AM   Specimen: BLOOD  Result Value Ref Range Status   Specimen Description BLOOD BLOOD LEFT ARM  Final   Special Requests  Final    BOTTLES DRAWN AEROBIC AND ANAEROBIC Blood Culture adequate volume   Culture   Final    NO GROWTH 2 DAYS Performed at [facility], [address], [place], [place] [address]    Report Status PENDING  Incomplete  Blood Culture (routine x 2)     Status: None (Preliminary result)   Collection Time: 07/08/23  8:20 AM   Specimen: BLOOD  Result Value Ref Range Status   Specimen Description BLOOD BLOOD RIGHT ARM  Final   Special Requests   Final    BOTTLES DRAWN AEROBIC AND ANAEROBIC Blood Culture adequate volume   Culture   Final    NO GROWTH 2 DAYS Performed at [facility], [address] [place], [place] [address]    Report Status PENDING  Incomplete    Labs: CBC: Recent Labs  Lab 07/08/23 0800 07/09/23 0828 07/10/23 0733  WBC 6.5 4.3 4.6  NEUTROABS 5.0 2.8 2.7  HGB 13.6 12.4 12.9  HCT 42.8 39.0 39.7  MCV 91.1 89.9 89.4  PLT 95* 89* 103*   Basic Metabolic Panel: Recent Labs  Lab 07/08/23 0800 07/09/23 0828 07/10/23 0733  NA 134* 136 136  K 4.6 4.2 4.3  CL 97* 102 99  CO2 26 24 26   GLUCOSE 122* 101* 81  BUN 24* 17 17  CREATININE 1.17* 0.83 1.06*  CALCIUM 8.8* 8.5* 8.6*  MG  --   --  1.6*  PHOS  --   --  2.5   Liver Function Tests: Recent Labs  Lab 07/08/23 0800 07/09/23 0828 07/10/23 0733  AST 34 30 24  ALT 25 20 18   ALKPHOS 56 47 46  BILITOT 0.7 0.6 1.0  PROT 6.8 5.9* 5.7*  ALBUMIN 3.5 2.9* 2.7*   CBG: No results for input(s): "GLUCAP" in the last 168 hours.  Discharge time spent: 32 minutes.  Signed: Debarah Crape, DO [facility] 07/10/2023

## 2023-07-10 NOTE — Plan of Care (Signed)
   Problem: Clinical Measurements: Goal: Ability to maintain clinical measurements within normal limits will improve Outcome: Progressing   Problem: Clinical Measurements: Goal: Will remain free from infection Outcome: Progressing   Problem: Clinical Measurements: Goal: Diagnostic test results will improve Outcome: Progressing

## 2023-07-10 NOTE — Plan of Care (Signed)
  Problem: Clinical Measurements: Goal: Ability to maintain clinical measurements within normal limits will improve 07/10/2023 0710 by Lexine Baton, RN Outcome: Progressing 07/10/2023 0709 by Lexine Baton, RN Outcome: Progressing   Problem: Clinical Measurements: Goal: Ability to maintain clinical measurements within normal limits will improve 07/10/2023 0709 by Lexine Baton, RN Outcome: Progressing   Problem: Clinical Measurements: Goal: Will remain free from infection 07/10/2023 0710 by Lexine Baton, RN Outcome: Progressing 07/10/2023 0709 by Lexine Baton, RN Outcome: Progressing   Problem: Clinical Measurements: Goal: Diagnostic test results will improve Outcome: Progressing

## 2023-07-10 NOTE — Progress Notes (Addendum)
 Mobility Specialist: Progress Note   07/10/23 1211  Mobility  Activity Transferred from bed to chair  Level of Assistance Contact guard assist, steadying assist  Assistive Device Front wheel walker  Activity Response Tolerated well  Mobility Referral Yes  Mobility visit 1 Mobility  Mobility Specialist Start Time (ACUTE ONLY) [id number]  Mobility Specialist Stop Time (ACUTE ONLY) 0929  Mobility Specialist Time Calculation (min) (ACUTE ONLY) 24 min    BP (sitting in recliner): 127/84  Pt was agreeable to mobility session - received in bed. MinA for bed mobility to assist with trunk elevation. MinG for STS and stand pivot transfer. C/o slight dizziness once EOB but symtoms subsided once settled in the chair. Left in chair with all needs met, call bell in reach. Chair alarm on.  Maurene Capes Mobility Specialist Please contact via SecureChat or Rehab office at [phone]

## 2023-07-13 LAB — CULTURE, BLOOD (ROUTINE X 2)
Special Requests: ADEQUATE
Special Requests: ADEQUATE

## 2023-07-14 ENCOUNTER — Ambulatory Visit: Payer: Self-pay | Admitting: Family Medicine

## 2023-07-14 NOTE — Telephone Encounter (Addendum)
 3rd attempt, this RN attempted [name]'s number, "call cannot be completed as dialed". Routing to clinic for follow up.  2nd attempt, this RN attempted [name]'s number x3, "call cannot be completed as dialed" message.

## 2023-07-14 NOTE — Telephone Encounter (Signed)
 Copied from CRM [id number]. Topic: Clinical - Medical Advice >> Jul 14, 2023  8:10 AM Clide Dales wrote: Patient's son, [name] called and stated that EMS was called to check on patient this morning because she was exhibiting increased symptoms of UTI and confusion. EMS said everything checked out ok but to follow up with PCP. Patient's son states that patient is scheduled to see PCP on 3/18 for a hospital follow up from being hospitalized for the flu. Patient son states they have some antibiotics left from the last UTI and is asking if it is ok to start treating now or wait until her appointment next week. Please advise.  Attempted three times to call [name]. Reach a recording "Unable to complete the number as dialed."

## 2023-07-18 ENCOUNTER — Other Ambulatory Visit: Payer: Self-pay | Admitting: Family Medicine

## 2023-07-18 ENCOUNTER — Ambulatory Visit (INDEPENDENT_AMBULATORY_CARE_PROVIDER_SITE_OTHER): Admitting: Family Medicine

## 2023-07-18 VITALS — BP 120/72 | HR 95 | Temp 97.6°F | Ht 59.0 in | Wt 167.4 lb

## 2023-07-18 DIAGNOSIS — I6381 Other cerebral infarction due to occlusion or stenosis of small artery: Secondary | ICD-10-CM

## 2023-07-18 DIAGNOSIS — J111 Influenza due to unidentified influenza virus with other respiratory manifestations: Secondary | ICD-10-CM | POA: Diagnosis not present

## 2023-07-18 MED ORDER — AMOXICILLIN-POT CLAVULANATE 875-125 MG PO TABS: 1.0000 | ORAL_TABLET | Freq: Two times a day (BID) | ORAL | 0 refills | Status: DC

## 2023-07-18 NOTE — Progress Notes (Signed)
 Subjective:    Patient ID: [name], female    DOB: 1933-09-03, 89 y.o.   MRN: [id number]  HPI Patient was recently admitted to the hospital due to sepsis from influenza.  She was battling a urinary tract infection and was then discovered to have influenza.  While in the hospital, CT scan showed worsening abdominal aortic aneurysm of 6 m.  However vascular surgery deemed the patient too high surgical risk to repair.  Patient completed antibiotic therapy in the hospital for her urinary tract infection.  Hospitalization was complicated by tachycardia due to sepsis and thrombocytopenia.  Since discharge from the hospital, the patient's son states that she is coughing constantly.  She has a cough productive of brown sputum.  On exam today she is coughing up brown sputum frequently.  She has lung congestion.  Pulmonary exam is abnormal.  However pulse oximetry is 95% on room air Past Medical History:  Diagnosis Date   [name] (abdominal aortic aneurysm) (HCC) 09/2013   3.6 cm   Acute and chronic respiratory failure with hypoxia (HCC)    Acute delirium    Acute encephalopathy    Acute pyelonephritis    Atrial fibrillation (HCC)    Chronic pain syndrome    Constipation    Dementia (HCC)    Dependent edema    bilateral legs    Depression    Diabetes mellitus    pre-diabetes   Diverticulosis    Diverticulosis    Dysrhythmia    Dysuria    E coli bacteremia    Fall    Gross hematuria    Hydronephrosis with renal and ureteral calculous obstruction    Hyperlipidemia    Hypertension    Hypoxemia    Idiopathic peripheral autonomic neuropathy    Infestation by bed bug    Nephrolithiasis    OSA on CPAP    Osteoarthritis    Osteopenia    T=  -2.1 in hip   Persistent mood (affective) disorder, unspecified (HCC)    Prediabetes    Rheumatoid arthritis (HCC)    Rheumatoid arthritis(714.0)    S/P carpal tunnel release    Sepsis secondary to UTI [facility])    Spinal stenosis    Unspecified  fracture of fifth lumbar vertebra, initial encounter for closed fracture (HCC)    Unspecified fracture of t11-T12 vertebra, initial encounter for closed fracture (HCC)    Urinary incontinence    Urinary tract infection    hx of    Urolithiasis    Past Surgical History:  Procedure Laterality Date   APPENDECTOMY     BIOPSY  02/15/2023   Procedure: BIOPSY;  Surgeon: Corbin Ade, MD;  [facility]: [facility];  Service: Endoscopy;;   CARPAL TUNNEL RELEASE  12/27/2006   Right subcutaneous ulnar nerve transfer -- Right open carpal tunnel release   CHOLECYSTECTOMY     CYSTOSCOPY W/ URETERAL STENT PLACEMENT Right 01/30/2018   Procedure: CYSTOSCOPY WITH STENT REPLACEMENT retrograde pylegram;  Surgeon: Crist Fat, MD;  Location: [facility];  Service: Urology;  Laterality: Right;   CYSTOSCOPY WITH URETEROSCOPY AND STENT PLACEMENT Right 03/01/2018   Procedure: CYSTOSCPY/RIGHT URETEROSCOPY HOLMIUM LASER LITHOTRIPSY AND STENT EXCHANGE;  Surgeon: Crist Fat, MD;  Location: [facility];  Service: Urology;  Laterality: Right;   ESOPHAGOGASTRODUODENOSCOPY (EGD) WITH PROPOFOL N/A 02/15/2023   Procedure: ESOPHAGOGASTRODUODENOSCOPY (EGD) WITH PROPOFOL;  Surgeon: Corbin Ade, MD;  [facility];  Service: Endoscopy;  Laterality: N/A;  2:15 pm, asa 3/4  FOOT SURGERY     HIP ARTHROPLASTY Left 08/27/2020   Procedure: ARTHROPLASTY BIPOLAR HIP (HEMIARTHROPLASTY);  Surgeon: Oliver Barre, MD;  Location: [facility];  Service: Orthopedics;  Laterality: Left;   HOLMIUM LASER APPLICATION Right 03/01/2018   Procedure: HOLMIUM LASER APPLICATION;  Surgeon: Crist Fat, MD;  Location: [facility];  Service: Urology;  Laterality: Right;   JOINT REPLACEMENT     BTKR, RTHR   MALONEY DILATION N/A 02/15/2023   Procedure: MALONEY DILATION;  Surgeon: Corbin Ade, MD;  [facility];  Service: Endoscopy;  Laterality: N/A;   TOTAL KNEE ARTHROPLASTY  08/21/2007   Right total knee replacement    Current Outpatient Medications on File Prior to Visit  Medication Sig Dispense Refill   albuterol (VENTOLIN HFA) 108 (90 Base) MCG/ACT inhaler Inhale 2 puffs into the lungs every 4 (four) hours as needed for wheezing or shortness of breath. 8 g 3   aspirin EC 81 MG tablet Take 1 tablet (81 mg total) by mouth daily.     atenolol (TENORMIN) 25 MG tablet TAKE ONE-HALF TABLET BY MOUTH DAILY 45 tablet 1   Calcium Carbonate-Vitamin D3 600-400 MG-UNIT TABS Take 1 tablet by mouth daily.     clotrimazole-betamethasone (LOTRISONE) cream Apply 1 Application topically daily as needed (fungal infection).     DULoxetine (CYMBALTA) 30 MG capsule Take 1 capsule (30 mg total) by mouth daily. (Patient taking differently: Take 30 mg by mouth daily. Take in addition to the Cymbalta 60 mg to equal a total dose of 90 mg per day.) 90 capsule 1   DULoxetine (CYMBALTA) 60 MG capsule Take 1 capsule (60 mg total) by mouth daily. Take in addition to the Cymbalta 30 mg to equal a total dose of 90 mg per day. 90 capsule 1   ezetimibe (ZETIA) 10 MG tablet TAKE 1 TABLET (10 MG TOTAL) BY MOUTH DAILY. 90 tablet 3   gabapentin (NEURONTIN) 800 MG tablet TAKE 1 TABLET BY MOUTH 3 TIMES DAILY 90 tablet 10   ketoconazole (NIZORAL) 2 % cream Apply 1 Application topically 2 (two) times daily. 15 g 6   meloxicam (MOBIC) 7.5 MG tablet TAKE 1 TABLET BY MOUTH EVERY DAY 30 tablet 0   pantoprazole (PROTONIX) 40 MG tablet TAKE 1 TABLET BY MOUTH ONCE DAILY 90 tablet 1   rosuvastatin (CRESTOR) 40 MG tablet TAKE 1 TABLET BY MOUTH IN THE EVENING 90 tablet 1   No current facility-administered medications on file prior to visit.   Allergies  Allergen Reactions   Sulfonamide Derivatives Hives and Rash   Social History   Socioeconomic History   Marital status: Divorced    Spouse name: Not on file   Number of children: 6   Years of education: 8th grade   Highest education level: Not on file  Occupational History   Occupation: Retired   Tobacco Use   Smoking status: Never   Smokeless tobacco: Never  Vaping Use   Vaping status: Never Used  Substance and Sexual Activity   Alcohol use: No   Drug use: No   Sexual activity: Not Currently  Other Topics Concern   Not on file  Social History Narrative   Lives at home with 2 sons   She has four living children.   Right-handed.   Two cups caffeine per day.   Social Drivers of Health   Financial Resource Strain: Low Risk  (04/27/2023)   Overall Financial Resource Strain (CARDIA)    Difficulty of Paying Living  Expenses: Not hard at all  Food Insecurity: No Food Insecurity (07/09/2023)   Hunger Vital Sign    Worried About Running Out of Food in the Last Year: Never true    Ran Out of Food in the Last Year: Never true  Transportation Needs: No Transportation Needs (07/09/2023)   PRAPARE - Administrator, Civil Service (Medical): No    Lack of Transportation (Non-Medical): No  Physical Activity: Inactive (04/27/2023)   Exercise Vital Sign    Days of Exercise per Week: 0 days    Minutes of Exercise per Session: 0 min  Stress: No Stress Concern Present (04/27/2023)   [employer] of Occupational Health - Occupational Stress Questionnaire    Feeling of Stress : Not at all  Social Connections: Moderately Isolated (07/09/2023)   Social Connection and Isolation Panel [NHANES]    Frequency of Communication with Friends and Family: Three times a week    Frequency of Social Gatherings with Friends and Family: Once a week    Attends Religious Services: More than 4 times per year    Active Member of [employer] or Organizations: No    Attends Banker Meetings: Never    Marital Status: Divorced  Catering manager Violence: Not At Risk (07/09/2023)   Humiliation, Afraid, Rape, and Kick questionnaire    Fear of Current or Ex-Partner: No    Emotionally Abused: No    Physically Abused: No    Sexually Abused: No     Review of Systems  All other systems  reviewed and are negative.      Objective:   Physical Exam Constitutional:      General: She is not in acute distress.    Appearance: Normal appearance. She is normal weight. She is not ill-appearing, toxic-appearing or diaphoretic.  Cardiovascular:     Rate and Rhythm: Normal rate. Rhythm irregular.     Heart sounds: Normal heart sounds. No murmur heard.    No gallop.  Pulmonary:     Effort: Pulmonary effort is normal. No tachypnea or respiratory distress.     Breath sounds: Examination of the right-middle field reveals rhonchi. Examination of the left-middle field reveals rhonchi and rales. Examination of the right-lower field reveals rhonchi. Examination of the left-lower field reveals rhonchi and rales. Rhonchi and rales present. No wheezing.    Chest:     Chest wall: No tenderness or edema.  Abdominal:     General: There is no distension.     Tenderness: There is no abdominal tenderness. There is no guarding or rebound.  Musculoskeletal:     Right lower leg: No edema.     Left lower leg: No edema.  Neurological:     Mental Status: She is alert.           Assessment & Plan:  Influenza - Plan: CBC with Differential/Platelet, COMPLETE METABOLIC PANEL WITH GFR I am concerned about secondary pneumonia.  Add Augmentin 875 mg twice daily days.  Recommended Mucinex twice daily.  Recommended incentive spirometry and deep breathing exercises with aggressive pulmonary toilet.  Check CBC and CMP to follow-up thrombocytopenia.  If breathing is worsening she needs to be reevaluated immediately.  I agree that the patient is too high surgical risk to repair the [name].  Defer management to vascular surgery

## 2023-07-19 LAB — CBC WITH DIFFERENTIAL/PLATELET
Absolute Lymphocytes: 2597 {cells}/uL (ref 850–3900)
Absolute Monocytes: 518 {cells}/uL (ref 200–950)
Basophils Absolute: 67 {cells}/uL (ref 0–200)
Basophils Relative: 0.9 %
Eosinophils Absolute: 118 {cells}/uL (ref 15–500)
Eosinophils Relative: 1.6 %
HCT: 40.6 % (ref 35.0–45.0)
Hemoglobin: 12.9 g/dL (ref 11.7–15.5)
MCH: 28.7 pg (ref 27.0–33.0)
MCHC: 31.8 g/dL — ABNORMAL LOW (ref 32.0–36.0)
MCV: 90.2 fL (ref 80.0–100.0)
MPV: 10.4 fL (ref 7.5–12.5)
Monocytes Relative: 7 %
Neutro Abs: 4100 {cells}/uL (ref 1500–7800)
Neutrophils Relative %: 55.4 %
Platelets: 174 10*3/uL (ref 140–400)
RBC: 4.5 10*6/uL (ref 3.80–5.10)
RDW: 13.5 % (ref 11.0–15.0)
Total Lymphocyte: 35.1 %
WBC: 7.4 10*3/uL (ref 3.8–10.8)

## 2023-07-19 LAB — COMPLETE METABOLIC PANEL WITH GFR
AG Ratio: 1.3 (calc) (ref 1.0–2.5)
ALT: 17 U/L (ref 6–29)
AST: 27 U/L (ref 10–35)
Albumin: 3.5 g/dL — ABNORMAL LOW (ref 3.6–5.1)
Alkaline phosphatase (APISO): 58 U/L (ref 37–153)
BUN: 19 mg/dL (ref 7–25)
CO2: 34 mmol/L — ABNORMAL HIGH (ref 20–32)
Calcium: 8.7 mg/dL (ref 8.6–10.4)
Chloride: 98 mmol/L (ref 98–110)
Creat: 0.89 mg/dL (ref 0.60–0.95)
Globulin: 2.6 g/dL (ref 1.9–3.7)
Glucose, Bld: 140 mg/dL — ABNORMAL HIGH (ref 65–99)
Potassium: 4.1 mmol/L (ref 3.5–5.3)
Sodium: 139 mmol/L (ref 135–146)
Total Bilirubin: 0.9 mg/dL (ref 0.2–1.2)
Total Protein: 6.1 g/dL (ref 6.1–8.1)

## 2023-07-19 NOTE — Telephone Encounter (Signed)
 Requested Prescriptions  Pending Prescriptions Disp Refills   pantoprazole (PROTONIX) 40 MG tablet [Pharmacy Med Name: PANTOPRAZOLE DR 40MG  TAB 40 Tablet] 90 tablet 0    Sig: TAKE 1 TABLET BY MOUTH ONCE DAILY     Gastroenterology: Proton Pump Inhibitors Failed - 07/19/2023 11:07 AM      Failed - Valid encounter within last 12 months    Recent Outpatient Visits           1 year ago History of CVA (cerebrovascular accident)   [facility] Donita Brooks, MD   2 years ago Asymptomatic bacteriuria   [facility] Pickard, Priscille Heidelberg, MD   2 years ago Dehydration   [facility] Donita Brooks, MD   2 years ago Atrial fibrillation, chronic (HCC)   [facility] Donita Brooks, MD   2 years ago Acute cystitis without hematuria   [facility] Medicine Cathlean Marseilles A, NP               rosuvastatin (CRESTOR) 40 MG tablet [Pharmacy Med Name: ROSUVASTATIN 40MG  TAB 40 Tablet] 90 tablet 0    Sig: TAKE 1 TABLET BY MOUTH IN THE EVENING     Cardiovascular:  Antilipid - Statins 2 Failed - 07/19/2023 11:07 AM      Failed - Valid encounter within last 12 months    Recent Outpatient Visits           1 year ago History of CVA (cerebrovascular accident)   [facility] Donita Brooks, MD   2 years ago Asymptomatic bacteriuria   [facility] Pickard, Priscille Heidelberg, MD   2 years ago Dehydration   [facility] Donita Brooks, MD   2 years ago Atrial fibrillation, chronic (HCC)   [facility] Donita Brooks, MD   2 years ago Acute cystitis without hematuria   [facility] Cathlean Marseilles A, NP              Failed - Lipid Panel in normal range within the last 12 months    Cholesterol  Date Value Ref Range Status  07/01/2022 93 <200 mg/dL Final   LDL Cholesterol (Calc)  Date Value Ref Range Status  07/01/2022 27 mg/dL (calc)  Final    Comment:    Reference range: <100 . Desirable range <100 mg/dL for primary prevention;   <70 mg/dL for patients with CHD or diabetic patients  with > or = 2 CHD risk factors. Marland Kitchen LDL-C is now calculated using the Martin-Hopkins  calculation, which is a validated novel method providing  better accuracy than the Friedewald equation in the  estimation of LDL-C.  Horald Pollen et al. Lenox Ahr. 1610;960(45): 2061-2068  (http://education.[facility].com/faq/FAQ164)    HDL  Date Value Ref Range Status  07/01/2022 48 (L) > OR = 50 mg/dL Final   Triglycerides  Date Value Ref Range Status  07/01/2022 98 <150 mg/dL Final         Passed - Cr in normal range and within 360 days    Creat  Date Value Ref Range Status  07/18/2023 0.89 0.60 - 0.95 mg/dL Final   Creatinine, Urine  Date Value Ref Range Status  03/08/2007 135.3 mg/dL Final         Passed - Patient is not pregnant

## 2023-07-20 ENCOUNTER — Ambulatory Visit (INDEPENDENT_AMBULATORY_CARE_PROVIDER_SITE_OTHER): Payer: 59 | Admitting: Podiatry

## 2023-07-20 ENCOUNTER — Encounter: Payer: Self-pay | Admitting: Podiatry

## 2023-07-20 DIAGNOSIS — D2371 Other benign neoplasm of skin of right lower limb, including hip: Secondary | ICD-10-CM | POA: Diagnosis not present

## 2023-07-20 DIAGNOSIS — M79676 Pain in unspecified toe(s): Secondary | ICD-10-CM

## 2023-07-20 DIAGNOSIS — B351 Tinea unguium: Secondary | ICD-10-CM | POA: Diagnosis not present

## 2023-07-20 DIAGNOSIS — E1142 Type 2 diabetes mellitus with diabetic polyneuropathy: Secondary | ICD-10-CM

## 2023-07-20 DIAGNOSIS — D2372 Other benign neoplasm of skin of left lower limb, including hip: Secondary | ICD-10-CM | POA: Diagnosis not present

## 2023-07-20 NOTE — Progress Notes (Signed)
She presents today chief complaint of painful elongated toenails.  Objective: Toenails are long thick yellow dystrophic-like mycotic painful palpation as well as debridement.  Assessment: Pain in limb secondary to onychomycosis.  Plan: Debridement of toenails 1 through 5 bilateral.

## 2023-08-01 ENCOUNTER — Other Ambulatory Visit: Payer: Self-pay

## 2023-08-01 NOTE — Telephone Encounter (Signed)
 Prescription Request  08/01/2023  LOV: 07/18/23  What is the name of the medication or equipment? meloxicam (MOBIC) 7.5 MG tablet [[id number]]   Have you contacted your pharmacy to request a refill? Yes   Which pharmacy would you like this sent to?  [id number] - [place], [place] - [address] [place] [address] Phone: [phone] Fax: [phone]    Patient notified that their request is being sent to the clinical staff for review and that they should receive a response within 2 business days.   Please advise at [facility] [phone]

## 2023-08-03 MED ORDER — MELOXICAM 7.5 MG PO TABS: 7.5000 mg | ORAL_TABLET | Freq: Every day | ORAL | 0 refills | Status: DC

## 2023-08-03 NOTE — Telephone Encounter (Signed)
 Requested Prescriptions  Pending Prescriptions Disp Refills   meloxicam (MOBIC) 7.5 MG tablet 90 tablet 0    Sig: Take 1 tablet (7.5 mg total) by mouth daily.     Analgesics:  COX2 Inhibitors Failed - 08/03/2023 11:11 AM      Failed - Manual Review: Labs are only required if the patient has taken medication for more than 8 weeks.      Passed - HGB in normal range and within 360 days    Hemoglobin  Date Value Ref Range Status  07/18/2023 12.9 11.7 - 15.5 g/dL Final  16/01/9603 54.0 11.1 - 15.9 g/dL Final         Passed - Cr in normal range and within 360 days    Creat  Date Value Ref Range Status  07/18/2023 0.89 0.60 - 0.95 mg/dL Final   Creatinine, Urine  Date Value Ref Range Status  03/08/2007 135.3 mg/dL Final         Passed - HCT in normal range and within 360 days    HCT  Date Value Ref Range Status  07/18/2023 40.6 35.0 - 45.0 % Final   Hematocrit  Date Value Ref Range Status  02/11/2018 33.6 (L) 34.0 - 46.6 % Final         Passed - AST in normal range and within 360 days    AST  Date Value Ref Range Status  07/18/2023 27 10 - 35 U/L Final         Passed - ALT in normal range and within 360 days    ALT  Date Value Ref Range Status  07/18/2023 17 6 - 29 U/L Final         Passed - eGFR is 30 or above and within 360 days    GFR, Est African American  Date Value Ref Range Status  12/12/2018 66 > OR = 60 mL/min/1.89m2 Final   GFR calc Af Amer  Date Value Ref Range Status  07/10/2019 47 (L) >60 mL/min Final   GFR, Est Non African American  Date Value Ref Range Status  12/12/2018 57 (L) > OR = 60 mL/min/1.11m2 Final   GFR, Estimated  Date Value Ref Range Status  07/10/2023 51 (L) >60 mL/min Final    Comment:    (NOTE) Calculated using the CKD-EPI Creatinine Equation (2021)    eGFR  Date Value Ref Range Status  07/01/2022 52 (L) > OR = 60 mL/min/1.44m2 Final         Passed - Patient is not pregnant      Passed - Valid encounter within last 12  months    Recent Outpatient Visits           2 weeks ago Influenza   [facility] Donita Brooks, MD   1 month ago Acute cystitis without hematuria   [place] [facility] [place], FNP   2 months ago Acute cystitis without hematuria   [place] [facility] [place], FNP   3 months ago Chronic pain of both knees   [place] [facility] Pickard, Priscille Heidelberg, MD   3 months ago History of URI (upper respiratory infection)    [facility] [place], FNP

## 2023-08-10 ENCOUNTER — Ambulatory Visit (INDEPENDENT_AMBULATORY_CARE_PROVIDER_SITE_OTHER): Admitting: Vascular Surgery

## 2023-08-10 ENCOUNTER — Encounter: Payer: Self-pay | Admitting: Vascular Surgery

## 2023-08-10 VITALS — BP 101/67 | HR 94 | Temp 97.7°F | Resp 20 | Ht 59.0 in | Wt 167.0 lb

## 2023-08-10 DIAGNOSIS — I7141 Pararenal abdominal aortic aneurysm, without rupture: Secondary | ICD-10-CM

## 2023-08-10 NOTE — Progress Notes (Signed)
 Office Note    HPI: [name] is a 89 y.o. (August 06, 1933) female presenting in follow-up with known pararenal aneurysm appreciated last month as an incidental finding on CT.  Her initial presentation was for failure to thrive, malaise, fever.  She was flu positive, and had a UTI.  Fortunately, she has recovered from this.    On exam today, [name] was doing well, accompanied by her son.  [name] presented in a wheelchair-she is able to stand and take a few steps with use of a walker and help, but that is it.  Per her son, her appetite is improving, but all she asks for is pancakes and bacon.  She denies chest pain, back pain, abdominal pain Does not ambulate enough for claudication.  No rest pain, no tissue loss   Past Medical History:  Diagnosis Date   [name] (abdominal aortic aneurysm) (HCC) 09/2013   3.6 cm   Acute and chronic respiratory failure with hypoxia (HCC)    Acute delirium    Acute encephalopathy    Acute pyelonephritis    Atrial fibrillation (HCC)    Chronic pain syndrome    Constipation    Dementia (HCC)    Dependent edema    bilateral legs    Depression    Diabetes mellitus    pre-diabetes   Diverticulosis    Diverticulosis    Dysrhythmia    Dysuria    E coli bacteremia    Fall    Gross hematuria    Hydronephrosis with renal and ureteral calculous obstruction    Hyperlipidemia    Hypertension    Hypoxemia    Idiopathic peripheral autonomic neuropathy    Infestation by bed bug    Nephrolithiasis    OSA on CPAP    Osteoarthritis    Osteopenia    T=  -2.1 in hip   Persistent mood (affective) disorder, unspecified (HCC)    Prediabetes    Rheumatoid arthritis (HCC)    Rheumatoid arthritis(714.0)    S/P carpal tunnel release    Sepsis secondary to UTI [facility])    Spinal stenosis    Unspecified fracture of fifth lumbar vertebra, initial encounter for closed fracture (HCC)    Unspecified fracture of t11-T12 vertebra, initial encounter for closed fracture  (HCC)    Urinary incontinence    Urinary tract infection    hx of    Urolithiasis     Past Surgical History:  Procedure Laterality Date   APPENDECTOMY     BIOPSY  02/15/2023   Procedure: BIOPSY;  Surgeon: Corbin Ade, MD;  Location: [facility];  Service: Endoscopy;;   CARPAL TUNNEL RELEASE  12/27/2006   Right subcutaneous ulnar nerve transfer -- Right open carpal tunnel release   CHOLECYSTECTOMY     CYSTOSCOPY W/ URETERAL STENT PLACEMENT Right 01/30/2018   Procedure: CYSTOSCOPY WITH STENT REPLACEMENT retrograde pylegram;  Surgeon: Crist Fat, MD;  Location: [facility];  Service: Urology;  Laterality: Right;   CYSTOSCOPY WITH URETEROSCOPY AND STENT PLACEMENT Right 03/01/2018   Procedure: CYSTOSCPY/RIGHT URETEROSCOPY HOLMIUM LASER LITHOTRIPSY AND STENT EXCHANGE;  Surgeon: Crist Fat, MD;  Location: [facility];  Service: Urology;  Laterality: Right;   ESOPHAGOGASTRODUODENOSCOPY (EGD) WITH PROPOFOL N/A 02/15/2023   Procedure: ESOPHAGOGASTRODUODENOSCOPY (EGD) WITH PROPOFOL;  Surgeon: Corbin Ade, MD;  [facility];  Service: Endoscopy;  Laterality: N/A;  2:15 pm, asa 3/4   FOOT SURGERY     HIP ARTHROPLASTY Left 08/27/2020   Procedure: ARTHROPLASTY BIPOLAR  HIP (HEMIARTHROPLASTY);  Surgeon: Oliver Barre, MD;  Location: [facility];  Service: Orthopedics;  Laterality: Left;   HOLMIUM LASER APPLICATION Right 03/01/2018   Procedure: HOLMIUM LASER APPLICATION;  Surgeon: Crist Fat, MD;  Location: [facility];  Service: Urology;  Laterality: Right;   JOINT REPLACEMENT     BTKR, RTHR   MALONEY DILATION N/A 02/15/2023   Procedure: MALONEY DILATION;  Surgeon: Corbin Ade, MD;  [facility]: [facility];  Service: Endoscopy;  Laterality: N/A;   TOTAL KNEE ARTHROPLASTY  08/21/2007   Right total knee replacement    Social History   Socioeconomic History   Marital status: Divorced    Spouse name: Not on file   Number of children: 6   Years of education: 8th grade    Highest education level: Not on file  Occupational History   Occupation: Retired  Tobacco Use   Smoking status: Never   Smokeless tobacco: Never  Vaping Use   Vaping status: Never Used  Substance and Sexual Activity   Alcohol use: No   Drug use: No   Sexual activity: Not Currently  Other Topics Concern   Not on file  Social History Narrative   Lives at home with 2 sons   She has four living children.   Right-handed.   Two cups caffeine per day.   Social Drivers of Corporate investment banker Strain: Low Risk  (04/27/2023)   Overall Financial Resource Strain (CARDIA)    Difficulty of Paying Living Expenses: Not hard at all  Food Insecurity: No Food Insecurity (07/09/2023)   Hunger Vital Sign    Worried About Running Out of Food in the Last Year: Never true    Ran Out of Food in the Last Year: Never true  Transportation Needs: No Transportation Needs (07/09/2023)   PRAPARE - Administrator, Civil Service (Medical): No    Lack of Transportation (Non-Medical): No  Physical Activity: Inactive (04/27/2023)   Exercise Vital Sign    Days of Exercise per Week: 0 days    Minutes of Exercise per Session: 0 min  Stress: No Stress Concern Present (04/27/2023)   [employer] of Occupational Health - Occupational Stress Questionnaire    Feeling of Stress : Not at all  Social Connections: Moderately Isolated (07/09/2023)   Social Connection and Isolation Panel [NHANES]    Frequency of Communication with Friends and Family: Three times a week    Frequency of Social Gatherings with Friends and Family: Once a week    Attends Religious Services: More than 4 times per year    Active Member of [employer] or Organizations: No    Attends Banker Meetings: Never    Marital Status: Divorced  Catering manager Violence: Not At Risk (07/09/2023)   Humiliation, Afraid, Rape, and Kick questionnaire    Fear of Current or Ex-Partner: No    Emotionally Abused: No    Physically  Abused: No    Sexually Abused: No   Family History  Problem Relation Age of Onset   Stroke Mother    Stroke Father    Hypertension Sister    Diabetes Brother    Coronary artery disease Son        CABG at age 59   Heart disease Son    Heart attack Son    Hypertension Maternal Aunt    Cancer Daughter    Colon cancer Neg Hx     Current Outpatient Medications  Medication Sig Dispense  Refill   albuterol (VENTOLIN HFA) 108 (90 Base) MCG/ACT inhaler Inhale 2 puffs into the lungs every 4 (four) hours as needed for wheezing or shortness of breath. 8 g 3   amoxicillin-clavulanate (AUGMENTIN) 875-125 MG tablet Take 1 tablet by mouth 2 (two) times daily. 20 tablet 0   aspirin EC 81 MG tablet Take 1 tablet (81 mg total) by mouth daily.     atenolol (TENORMIN) 25 MG tablet TAKE ONE-HALF TABLET BY MOUTH DAILY 45 tablet 1   Calcium Carbonate-Vitamin D3 600-400 MG-UNIT TABS Take 1 tablet by mouth daily.     clotrimazole-betamethasone (LOTRISONE) cream Apply 1 Application topically daily as needed (fungal infection).     DULoxetine (CYMBALTA) 30 MG capsule Take 1 capsule (30 mg total) by mouth daily. (Patient taking differently: Take 30 mg by mouth daily. Take in addition to the Cymbalta 60 mg to equal a total dose of 90 mg per day.) 90 capsule 1   DULoxetine (CYMBALTA) 60 MG capsule Take 1 capsule (60 mg total) by mouth daily. Take in addition to the Cymbalta 30 mg to equal a total dose of 90 mg per day. 90 capsule 1   ezetimibe (ZETIA) 10 MG tablet TAKE 1 TABLET (10 MG TOTAL) BY MOUTH DAILY. 90 tablet 3   gabapentin (NEURONTIN) 800 MG tablet TAKE 1 TABLET BY MOUTH 3 TIMES DAILY 90 tablet 10   ketoconazole (NIZORAL) 2 % cream Apply 1 Application topically 2 (two) times daily. 15 g 6   meloxicam (MOBIC) 7.5 MG tablet Take 1 tablet (7.5 mg total) by mouth daily. 90 tablet 0   pantoprazole (PROTONIX) 40 MG tablet TAKE 1 TABLET BY MOUTH ONCE DAILY 90 tablet 0   rosuvastatin (CRESTOR) 40 MG tablet TAKE 1  TABLET BY MOUTH IN THE EVENING 90 tablet 0   No current facility-administered medications for this visit.    Allergies  Allergen Reactions   Sulfonamide Derivatives Hives and Rash     REVIEW OF SYSTEMS:  [X]  denotes positive finding, [ ]  denotes negative finding Cardiac  Comments:  Chest pain or chest pressure:    Shortness of breath upon exertion:    Short of breath when lying flat:    Irregular heart rhythm:        Vascular    Pain in calf, thigh, or hip brought on by ambulation:    Pain in feet at night that wakes you up from your sleep:     Blood clot in your veins:    Leg swelling:         Pulmonary    Oxygen at home:    Productive cough:     Wheezing:         Neurologic    Sudden weakness in arms or legs:     Sudden numbness in arms or legs:     Sudden onset of difficulty speaking or slurred speech:    Temporary loss of vision in one eye:     Problems with dizziness:         Gastrointestinal    Blood in stool:     Vomited blood:         Genitourinary    Burning when urinating:     Blood in urine:        Psychiatric    Major depression:         Hematologic    Bleeding problems:    Problems with blood clotting too easily:  Skin    Rashes or ulcers:        Constitutional    Fever or chills:      PHYSICAL EXAMINATION:  Vitals:   08/10/23 1504  BP: 101/67  Pulse: 94  Resp: 20  Temp: 97.7 F (36.5 C)  TempSrc: Temporal  SpO2: 90%  Weight: 167 lb (75.8 kg)  Height: 4\' 11"  (1.499 m)    General:  WDWN in NAD; vital signs documented above Gait: Not observed HENT: WNL, normocephalic Pulmonary: normal non-labored breathing , without wheezing Cardiac: regular HR Abdomen: soft, NT, no masses Skin: without rashes Vascular Exam/Pulses:  Right Left  Radial 2+ (normal) 2+ (normal)              DP 2+ 2+       Extremities: without ischemic changes, without Gangrene , without cellulitis; without open wounds;  Musculoskeletal: no muscle  wasting or atrophy  Neurologic: A&O X 3;  No focal weakness or paresthesias are detected Psychiatric:  The pt has Normal affect.   Non-Invasive Vascular Imaging:    IMPRESSION: 1. Tortuous atherosclerotic abdominal aorta with fusiform infrarenal abdominal aortic aneurysm measuring 4.4 cm AP x 5.4 cm transverse maximum diameter, minimally increased from 4.2 x 5.0 cm since 02/13/2021 CT abdomen/pelvis study using similar measurement technique. Recommend follow-up CT or MR as appropriate in 6 months and referral to or continued care with vascular specialist. (Ref.: J Vasc Surg. 2018; 67:2-77 and J Am Coll Radiol 2013;10(10):789-794.) 2. 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 3. 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 4. Moderate proximal stenoses in the bilateral renal arteries. 5. Multifocal mild-to-moderate stenoses in the IMA. 6. Marked left colonic diverticulosis. 7. Mildly lobulated liver surface, cannot exclude cirrhosis. No liver masses. 8. Chronic severe T12 and moderate L5 vertebral compression fractures. Diffuse osteopenia.    ASSESSMENT/PLAN: [name] is a 89 y.o. female presenting with asymptomatic 5 cm pararenal aneurysm.  This CT was reviewed from 07/08/2023.  Measurements were confirmed.  I had a long discussion with [name] and her son regarding aneurysmal disease and the natural history.   We also discussed that her aneurysm would be a very difficult repair.  With her comorbidities, I think that it would be best served with continued medical management as I think operative intervention would likely lead to mortality.  I was very clear about this to both [name] and her son.  She is currently DNR/DNI, and plans to discuss the aneurysm further with her family.  At this point, we will continue to treat medically.  If family chooses to move forward with surgery, my plan would be to intervene at 5.5 to 6 cm due to the  complexity of the case. I think the case would require physician modified endograft with likely prestenting of the superior mesenteric artery and celiac artery.   Victorino Sparrow, MD [facility] [phone]

## 2023-08-11 ENCOUNTER — Other Ambulatory Visit: Payer: Self-pay | Admitting: *Deleted

## 2023-08-11 DIAGNOSIS — I7141 Pararenal abdominal aortic aneurysm, without rupture: Secondary | ICD-10-CM

## 2023-08-16 ENCOUNTER — Other Ambulatory Visit: Payer: Self-pay | Admitting: Family Medicine

## 2023-08-16 DIAGNOSIS — G894 Chronic pain syndrome: Secondary | ICD-10-CM

## 2023-08-17 NOTE — Telephone Encounter (Signed)
 Requested Prescriptions  Pending Prescriptions Disp Refills   DULoxetine (CYMBALTA) 30 MG capsule [Pharmacy Med Name: DULOXETINE DR 30MG  CAP 30 Capsule] 90 capsule 1    Sig: TAKE 1 CAPSULE BY MOUTH DAILY     Psychiatry: Antidepressants - SNRI - duloxetine Passed - 08/17/2023  3:52 PM      Passed - Cr in normal range and within 360 days    Creat  Date Value Ref Range Status  07/18/2023 0.89 0.60 - 0.95 mg/dL Final   Creatinine, Urine  Date Value Ref Range Status  03/08/2007 135.3 mg/dL Final         Passed - eGFR is 30 or above and within 360 days    GFR, Est African American  Date Value Ref Range Status  12/12/2018 66 > OR = 60 mL/min/1.12m2 Final   GFR calc Af Amer  Date Value Ref Range Status  07/10/2019 47 (L) >60 mL/min Final   GFR, Est Non African American  Date Value Ref Range Status  12/12/2018 57 (L) > OR = 60 mL/min/1.33m2 Final   GFR, Estimated  Date Value Ref Range Status  07/10/2023 51 (L) >60 mL/min Final    Comment:    (NOTE) Calculated using the CKD-EPI Creatinine Equation (2021)    eGFR  Date Value Ref Range Status  07/01/2022 52 (L) > OR = 60 mL/min/1.46m2 Final         Passed - Completed PHQ-2 or PHQ-9 in the last 360 days      Passed - Last BP in normal range    BP Readings from Last 1 Encounters:  08/10/23 101/67         Passed - Valid encounter within last 6 months    Recent Outpatient Visits           1 month ago Influenza   [place] [facility] Donita Brooks, MD   1 month ago Acute cystitis without hematuria   [place] [facility] Park Meo, FNP   3 months ago Acute cystitis without hematuria   [place] [facility] Park Meo, FNP   3 months ago Chronic pain of both knees   [place] [facility] Donita Brooks, MD   4 months ago History of URI (upper respiratory infection)   [place] [facility] Dimas Aguas, Binnie Rail, FNP                DULoxetine (CYMBALTA) 60 MG capsule [Pharmacy Med Name: DULOXETINE DR 60MG  CAP 60 Capsule] 90 capsule 1    Sig: TAKE 1 CAPSULE BY MOUTH DAILY IN ADDITION TO CYMBALTA 30 MG TO EQUAL A TOTAL DOSE OF 90 MG PER DAY     Psychiatry: Antidepressants - SNRI - duloxetine Passed - 08/17/2023  3:52 PM      Passed - Cr in normal range and within 360 days    Creat  Date Value Ref Range Status  07/18/2023 0.89 0.60 - 0.95 mg/dL Final   Creatinine, Urine  Date Value Ref Range Status  03/08/2007 135.3 mg/dL Final         Passed - eGFR is 30 or above and within 360 days    GFR, Est African American  Date Value Ref Range Status  12/12/2018 66 > OR = 60 mL/min/1.44m2 Final   GFR calc Af Amer  Date Value Ref Range Status  07/10/2019 47 (L) >60 mL/min Final   GFR, Est Non  African American  Date Value Ref Range Status  12/12/2018 57 (L) > OR = 60 mL/min/1.47m2 Final   GFR, Estimated  Date Value Ref Range Status  07/10/2023 51 (L) >60 mL/min Final    Comment:    (NOTE) Calculated using the CKD-EPI Creatinine Equation (2021)    eGFR  Date Value Ref Range Status  07/01/2022 52 (L) > OR = 60 mL/min/1.73m2 Final         Passed - Completed PHQ-2 or PHQ-9 in the last 360 days      Passed - Last BP in normal range    BP Readings from Last 1 Encounters:  08/10/23 101/67         Passed - Valid encounter within last 6 months    Recent Outpatient Visits           1 month ago Influenza   [facility] Austine Lefort, MD   1 month ago Acute cystitis without hematuria   [place] [facility] Jenelle Mis, FNP   3 months ago Acute cystitis without hematuria   [place] [facility] Jenelle Mis, FNP   3 months ago Chronic pain of both knees   [place] [facility] Cheril Cork, Cisco Crest, MD   4 months ago History of URI (upper respiratory infection)   [place] [facility] Jenelle Mis, FNP

## 2023-08-24 ENCOUNTER — Encounter (HOSPITAL_BASED_OUTPATIENT_CLINIC_OR_DEPARTMENT_OTHER): Payer: Self-pay

## 2023-08-24 ENCOUNTER — Observation Stay (HOSPITAL_BASED_OUTPATIENT_CLINIC_OR_DEPARTMENT_OTHER)
Admission: EM | Admit: 2023-08-24 | Discharge: 2023-08-27 | Disposition: A | Discharge status: Home / Self Care | Attending: Internal Medicine | Admitting: Internal Medicine

## 2023-08-24 ENCOUNTER — Other Ambulatory Visit: Payer: Self-pay

## 2023-08-24 ENCOUNTER — Emergency Department (HOSPITAL_BASED_OUTPATIENT_CLINIC_OR_DEPARTMENT_OTHER)

## 2023-08-24 DIAGNOSIS — K219 Gastro-esophageal reflux disease without esophagitis: Secondary | ICD-10-CM | POA: Diagnosis not present

## 2023-08-24 DIAGNOSIS — Z79899 Other long term (current) drug therapy: Secondary | ICD-10-CM | POA: Diagnosis not present

## 2023-08-24 DIAGNOSIS — E1122 Type 2 diabetes mellitus with diabetic chronic kidney disease: Secondary | ICD-10-CM | POA: Diagnosis not present

## 2023-08-24 DIAGNOSIS — Z96651 Presence of right artificial knee joint: Secondary | ICD-10-CM | POA: Insufficient documentation

## 2023-08-24 DIAGNOSIS — N1831 Chronic kidney disease, stage 3a: Secondary | ICD-10-CM | POA: Insufficient documentation

## 2023-08-24 DIAGNOSIS — N390 Urinary tract infection, site not specified: Secondary | ICD-10-CM | POA: Diagnosis not present

## 2023-08-24 DIAGNOSIS — E1140 Type 2 diabetes mellitus with diabetic neuropathy, unspecified: Secondary | ICD-10-CM | POA: Diagnosis not present

## 2023-08-24 DIAGNOSIS — I5032 Chronic diastolic (congestive) heart failure: Secondary | ICD-10-CM | POA: Diagnosis not present

## 2023-08-24 DIAGNOSIS — Z8673 Personal history of transient ischemic attack (TIA), and cerebral infarction without residual deficits: Secondary | ICD-10-CM | POA: Insufficient documentation

## 2023-08-24 DIAGNOSIS — M179 Osteoarthritis of knee, unspecified: Secondary | ICD-10-CM | POA: Insufficient documentation

## 2023-08-24 DIAGNOSIS — I130 Hypertensive heart and chronic kidney disease with heart failure and stage 1 through stage 4 chronic kidney disease, or unspecified chronic kidney disease: Secondary | ICD-10-CM | POA: Insufficient documentation

## 2023-08-24 DIAGNOSIS — N3000 Acute cystitis without hematuria: Secondary | ICD-10-CM

## 2023-08-24 DIAGNOSIS — Z96642 Presence of left artificial hip joint: Secondary | ICD-10-CM | POA: Diagnosis not present

## 2023-08-24 DIAGNOSIS — R0602 Shortness of breath: Secondary | ICD-10-CM

## 2023-08-24 DIAGNOSIS — R109 Unspecified abdominal pain: Secondary | ICD-10-CM | POA: Diagnosis present

## 2023-08-24 DIAGNOSIS — Z7982 Long term (current) use of aspirin: Secondary | ICD-10-CM | POA: Diagnosis not present

## 2023-08-24 DIAGNOSIS — I482 Chronic atrial fibrillation, unspecified: Secondary | ICD-10-CM | POA: Diagnosis not present

## 2023-08-24 DIAGNOSIS — R062 Wheezing: Secondary | ICD-10-CM

## 2023-08-24 DIAGNOSIS — E785 Hyperlipidemia, unspecified: Secondary | ICD-10-CM | POA: Diagnosis not present

## 2023-08-24 DIAGNOSIS — I714 Abdominal aortic aneurysm, without rupture, unspecified: Secondary | ICD-10-CM | POA: Insufficient documentation

## 2023-08-24 LAB — URINALYSIS, ROUTINE W REFLEX MICROSCOPIC
Bilirubin Urine: NEGATIVE
Glucose, UA: NEGATIVE mg/dL
Hgb urine dipstick: NEGATIVE
Ketones, ur: NEGATIVE mg/dL
Nitrite: POSITIVE — AB
Specific Gravity, Urine: 1.026 (ref 1.005–1.030)
pH: 5.5 (ref 5.0–8.0)

## 2023-08-24 LAB — COMPREHENSIVE METABOLIC PANEL WITH GFR
ALT: 15 U/L (ref 0–44)
AST: 28 U/L (ref 15–41)
Albumin: 4.1 g/dL (ref 3.5–5.0)
Alkaline Phosphatase: 64 U/L (ref 38–126)
Anion gap: 8 (ref 5–15)
BUN: 17 mg/dL (ref 8–23)
CO2: 32 mmol/L (ref 22–32)
Calcium: 9.3 mg/dL (ref 8.9–10.3)
Chloride: 101 mmol/L (ref 98–111)
Creatinine, Ser: 0.93 mg/dL (ref 0.44–1.00)
GFR, Estimated: 59 mL/min — ABNORMAL LOW (ref >60)
Glucose, Bld: 149 mg/dL — ABNORMAL HIGH (ref 70–99)
Potassium: 3.9 mmol/L (ref 3.5–5.1)
Sodium: 140 mmol/L (ref 135–145)
Total Bilirubin: 0.6 mg/dL (ref 0.0–1.2)
Total Protein: 7.1 g/dL (ref 6.5–8.1)

## 2023-08-24 LAB — CBC WITH DIFFERENTIAL/PLATELET
Abs Immature Granulocytes: 0.02 10*3/uL (ref 0.00–0.07)
Basophils Absolute: 0.1 10*3/uL (ref 0.0–0.1)
Basophils Relative: 1 %
Eosinophils Absolute: 0.2 10*3/uL (ref 0.0–0.5)
Eosinophils Relative: 3 %
HCT: 43.1 % (ref 36.0–46.0)
Hemoglobin: 13.2 g/dL (ref 12.0–15.0)
Immature Granulocytes: 0 %
Lymphocytes Relative: 37 %
Lymphs Abs: 3.2 10*3/uL (ref 0.7–4.0)
MCH: 28.8 pg (ref 26.0–34.0)
MCHC: 30.6 g/dL (ref 30.0–36.0)
MCV: 93.9 fL (ref 80.0–100.0)
Monocytes Absolute: 0.6 10*3/uL (ref 0.1–1.0)
Monocytes Relative: 7 %
Neutro Abs: 4.5 10*3/uL (ref 1.7–7.7)
Neutrophils Relative %: 52 %
Platelets: 201 10*3/uL (ref 150–400)
RBC: 4.59 MIL/uL (ref 3.87–5.11)
RDW: 14.6 % (ref 11.5–15.5)
WBC: 8.7 10*3/uL (ref 4.0–10.5)
nRBC: 0 % (ref 0.0–0.2)

## 2023-08-24 MED ORDER — IOHEXOL 350 MG/ML SOLN
100.0000 mL | Freq: Once | INTRAVENOUS | Status: AC | PRN
  Administered 2023-08-24: 100 mL via INTRAVENOUS

## 2023-08-24 MED ORDER — ASPIRIN 81 MG PO TBEC
81.0000 mg | DELAYED_RELEASE_TABLET | Freq: Every day | ORAL | Status: DC
  Administered 2023-08-24 – 2023-08-27 (×4): 81 mg via ORAL
  Filled 2023-08-24 (×4): qty 1

## 2023-08-24 MED ORDER — ACETAMINOPHEN 325 MG PO TABS
650.0000 mg | ORAL_TABLET | Freq: Four times a day (QID) | ORAL | Status: DC | PRN
  Administered 2023-08-24 – 2023-08-25 (×2): 650 mg via ORAL
  Filled 2023-08-24 (×2): qty 2

## 2023-08-24 MED ORDER — ENOXAPARIN SODIUM 40 MG/0.4ML IJ SOSY
40.0000 mg | PREFILLED_SYRINGE | INTRAMUSCULAR | Status: DC
  Administered 2023-08-24 – 2023-08-26 (×3): 40 mg via SUBCUTANEOUS
  Filled 2023-08-24 (×2): qty 0.4

## 2023-08-24 MED ORDER — ONDANSETRON HCL 4 MG PO TABS: 4.0000 mg | ORAL_TABLET | Freq: Four times a day (QID) | ORAL | Status: DC | PRN

## 2023-08-24 MED ORDER — DULOXETINE HCL 30 MG PO CPEP
90.0000 mg | ORAL_CAPSULE | Freq: Every day | ORAL | Status: DC
  Administered 2023-08-24 – 2023-08-27 (×4): 90 mg via ORAL
  Filled 2023-08-24 (×4): qty 3

## 2023-08-24 MED ORDER — FENTANYL CITRATE PF 50 MCG/ML IJ SOSY
50.0000 ug | PREFILLED_SYRINGE | Freq: Once | INTRAMUSCULAR | Status: AC
  Administered 2023-08-24: 50 ug via INTRAVENOUS
  Filled 2023-08-24: qty 1

## 2023-08-24 MED ORDER — PANTOPRAZOLE SODIUM 40 MG PO TBEC
40.0000 mg | DELAYED_RELEASE_TABLET | Freq: Every day | ORAL | Status: DC
  Administered 2023-08-24 – 2023-08-27 (×4): 40 mg via ORAL
  Filled 2023-08-24 (×4): qty 1

## 2023-08-24 MED ORDER — ROSUVASTATIN CALCIUM 10 MG PO TABS
40.0000 mg | ORAL_TABLET | Freq: Every evening | ORAL | Status: DC
  Administered 2023-08-24 – 2023-08-26 (×3): 40 mg via ORAL
  Filled 2023-08-24: qty 8
  Filled 2023-08-24 (×3): qty 4

## 2023-08-24 MED ORDER — ATENOLOL 25 MG PO TABS
12.5000 mg | ORAL_TABLET | Freq: Every day | ORAL | Status: DC
  Administered 2023-08-24 – 2023-08-27 (×4): 12.5 mg via ORAL
  Filled 2023-08-24 (×4): qty 1

## 2023-08-24 MED ORDER — ONDANSETRON HCL 4 MG/2ML IJ SOLN
4.0000 mg | Freq: Once | INTRAMUSCULAR | Status: AC
  Administered 2023-08-24: 4 mg via INTRAVENOUS
  Filled 2023-08-24: qty 2

## 2023-08-24 MED ORDER — ACETAMINOPHEN 500 MG PO TABS
1000.0000 mg | ORAL_TABLET | Freq: Once | ORAL | Status: AC
  Administered 2023-08-24: 1000 mg via ORAL
  Filled 2023-08-24: qty 2

## 2023-08-24 MED ORDER — GABAPENTIN 400 MG PO CAPS
800.0000 mg | ORAL_CAPSULE | Freq: Three times a day (TID) | ORAL | Status: DC
  Administered 2023-08-24 – 2023-08-27 (×9): 800 mg via ORAL
  Filled 2023-08-24 (×3): qty 2
  Filled 2023-08-24: qty 8
  Filled 2023-08-24: qty 2
  Filled 2023-08-24: qty 8
  Filled 2023-08-24 (×2): qty 2
  Filled 2023-08-24: qty 8

## 2023-08-24 MED ORDER — SODIUM CHLORIDE 0.9 % IV SOLN
1.0000 g | INTRAVENOUS | Status: DC
  Administered 2023-08-25 – 2023-08-27 (×3): 1 g via INTRAVENOUS
  Filled 2023-08-24 (×3): qty 10

## 2023-08-24 MED ORDER — CEFTRIAXONE SODIUM 2 G IJ SOLR
2.0000 g | Freq: Once | INTRAMUSCULAR | Status: AC
  Administered 2023-08-24: 2 g via INTRAVENOUS
  Filled 2023-08-24: qty 20

## 2023-08-24 MED ORDER — KETOROLAC TROMETHAMINE 15 MG/ML IJ SOLN
15.0000 mg | Freq: Once | INTRAMUSCULAR | Status: AC
  Administered 2023-08-24: 15 mg via INTRAVENOUS

## 2023-08-24 MED ORDER — EZETIMIBE 10 MG PO TABS
10.0000 mg | ORAL_TABLET | Freq: Every day | ORAL | Status: DC
  Administered 2023-08-24 – 2023-08-27 (×4): 10 mg via ORAL
  Filled 2023-08-24 (×4): qty 1

## 2023-08-24 MED ORDER — SODIUM CHLORIDE 0.9 % IV BOLUS
1000.0000 mL | Freq: Once | INTRAVENOUS | Status: AC
  Administered 2023-08-24: 1000 mL via INTRAVENOUS

## 2023-08-24 MED ORDER — ACETAMINOPHEN 650 MG RE SUPP: 650.0000 mg | Freq: Four times a day (QID) | RECTAL | Status: DC | PRN

## 2023-08-24 MED ORDER — ALBUTEROL SULFATE (2.5 MG/3ML) 0.083% IN NEBU: 2.5000 mg | INHALATION_SOLUTION | RESPIRATORY_TRACT | Status: DC | PRN

## 2023-08-24 MED ORDER — TRAZODONE HCL 50 MG PO TABS
25.0000 mg | ORAL_TABLET | Freq: Every evening | ORAL | Status: DC | PRN
  Administered 2023-08-25 – 2023-08-26 (×2): 25 mg via ORAL
  Filled 2023-08-24 (×2): qty 1

## 2023-08-24 MED ORDER — KETOROLAC TROMETHAMINE 30 MG/ML IJ SOLN
15.0000 mg | Freq: Once | INTRAMUSCULAR | Status: DC
  Filled 2023-08-24: qty 1

## 2023-08-24 MED ORDER — ONDANSETRON HCL 4 MG/2ML IJ SOLN: 4.0000 mg | Freq: Four times a day (QID) | INTRAMUSCULAR | Status: DC | PRN

## 2023-08-24 MED ORDER — OXYCODONE HCL 5 MG PO TABS
5.0000 mg | ORAL_TABLET | ORAL | Status: DC | PRN
  Administered 2023-08-25 (×2): 5 mg via ORAL
  Filled 2023-08-24 (×2): qty 1

## 2023-08-24 NOTE — ED Notes (Signed)
-  Called [facility] at 608am to set up transportation to [facility], will be after 7am.

## 2023-08-24 NOTE — ED Triage Notes (Addendum)
 Rt. Flank pain x 2 days  Worse this am Denies N/V Denies dysuria Pain increases when taking a deep breath

## 2023-08-24 NOTE — ED Notes (Signed)
 CT called to get pt asap. She is otw

## 2023-08-24 NOTE — H&P (Signed)
 History and Physical  [name] VZD:[id number] DOB: 08/13/1933 DOA: 08/24/2023  PCP: Austine Lefort, MD   Chief Complaint: Right-sided flank pain  HPI: [name] is a 89 y.o. female with medical history significant for [name], CVA, CKD, hyperlipidemia, heart failure with preserved EF, atrial fibrillation on anticoagulation, dementia, presented to the ER with complaints of right-sided flank pain found to have UTI.  Patient denies fever, chest pain, or abdominal pain.  No changes in bowel habits.  Workup as detailed below shows evidence of UTI.  She was started on empiric IV antibiotics, and admitted to the hospitalist service.  Currently she is resting comfortably, with no complaints.  Of note she was treated recently for Klebsiella pneumonia UTI, which was sensitive to everything except ampicillin.  Review of Systems: Please see HPI for pertinent positives and negatives. A complete 10 system review of systems are otherwise negative.  Past Medical History:  Diagnosis Date   [name] (abdominal aortic aneurysm) (HCC) 09/2013   3.6 cm   Acute and chronic respiratory failure with hypoxia (HCC)    Acute delirium    Acute encephalopathy    Acute pyelonephritis    Atrial fibrillation (HCC)    Chronic pain syndrome    Constipation    Dementia (HCC)    Dependent edema    bilateral legs    Depression    Diabetes mellitus    pre-diabetes   Diverticulosis    Diverticulosis    Dysrhythmia    Dysuria    E coli bacteremia    Fall    Gross hematuria    Hydronephrosis with renal and ureteral calculous obstruction    Hyperlipidemia    Hypertension    Hypoxemia    Idiopathic peripheral autonomic neuropathy    Infestation by bed bug    Nephrolithiasis    OSA on CPAP    Osteoarthritis    Osteopenia    T=  -2.1 in hip   Persistent mood (affective) disorder, unspecified (HCC)    Prediabetes    Rheumatoid arthritis (HCC)    Rheumatoid arthritis(714.0)    S/P carpal tunnel release     Sepsis secondary to UTI [facility])    Spinal stenosis    Unspecified fracture of fifth lumbar vertebra, initial encounter for closed fracture (HCC)    Unspecified fracture of t11-T12 vertebra, initial encounter for closed fracture (HCC)    Urinary incontinence    Urinary tract infection    hx of    Urolithiasis    Past Surgical History:  Procedure Laterality Date   APPENDECTOMY     BIOPSY  02/15/2023   Procedure: BIOPSY;  Surgeon: Suzette Espy, MD;  Location: [facility];  Service: Endoscopy;;   CARPAL TUNNEL RELEASE  12/27/2006   Right subcutaneous ulnar nerve transfer -- Right open carpal tunnel release   CHOLECYSTECTOMY     CYSTOSCOPY W/ URETERAL STENT PLACEMENT Right 01/30/2018   Procedure: CYSTOSCOPY WITH STENT REPLACEMENT retrograde pylegram;  Surgeon: Andrez Banker, MD;  Location: [facility];  Service: Urology;  Laterality: Right;   CYSTOSCOPY WITH URETEROSCOPY AND STENT PLACEMENT Right 03/01/2018   Procedure: CYSTOSCPY/RIGHT URETEROSCOPY HOLMIUM LASER LITHOTRIPSY AND STENT EXCHANGE;  Surgeon: Andrez Banker, MD;  Location: [facility];  Service: Urology;  Laterality: Right;   ESOPHAGOGASTRODUODENOSCOPY (EGD) WITH PROPOFOL  N/A 02/15/2023   Procedure: ESOPHAGOGASTRODUODENOSCOPY (EGD) WITH PROPOFOL ;  Surgeon: Suzette Espy, MD;  Location: [facility];  Service: Endoscopy;  Laterality: N/A;  2:15 pm, asa 3/4  FOOT SURGERY     HIP ARTHROPLASTY Left 08/27/2020   Procedure: ARTHROPLASTY BIPOLAR HIP (HEMIARTHROPLASTY);  Surgeon: Tonita Frater, MD;  Location: [facility];  Service: Orthopedics;  Laterality: Left;   HOLMIUM LASER APPLICATION Right 03/01/2018   Procedure: HOLMIUM LASER APPLICATION;  Surgeon: Andrez Banker, MD;  Location: [facility];  Service: Urology;  Laterality: Right;   JOINT REPLACEMENT     BTKR, RTHR   MALONEY DILATION N/A 02/15/2023   Procedure: MALONEY DILATION;  Surgeon: Suzette Espy, MD;  Location: [facility];  Service: Endoscopy;  Laterality: N/A;    TOTAL KNEE ARTHROPLASTY  08/21/2007   Right total knee replacement   Social History:  reports that she has never smoked. She has never used smokeless tobacco. She reports that she does not drink alcohol and does not use drugs.  Allergies  Allergen Reactions   Sulfonamide Derivatives Hives and Rash    Family History  Problem Relation Age of Onset   Stroke Mother    Stroke Father    Hypertension Sister    Diabetes Brother    Coronary artery disease Son        CABG at age 23   Heart disease Son    Heart attack Son    Hypertension Maternal Aunt    Cancer Daughter    Colon cancer Neg Hx      Prior to Admission medications   Medication Sig Start Date End Date Taking? Authorizing Provider  albuterol  (VENTOLIN  HFA) 108 (90 Base) MCG/ACT inhaler Inhale 2 puffs into the lungs every 4 (four) hours as needed for wheezing or shortness of breath. 02/01/23   Austine Lefort, MD  aspirin  EC 81 MG tablet Take 1 tablet (81 mg total) by mouth daily. 04/03/23   Vann, Jessica U, DO  atenolol  (TENORMIN ) 25 MG tablet TAKE ONE-HALF TABLET BY MOUTH DAILY 06/20/23   Austine Lefort, MD  Calcium  Carbonate-Vitamin D3 600-400 MG-UNIT TABS Take 1 tablet by mouth daily. 02/04/20   [provider]  clotrimazole -betamethasone  (LOTRISONE ) cream Apply 1 Application topically daily as needed (fungal infection).    [provider]  DULoxetine  (CYMBALTA ) 30 MG capsule TAKE 1 CAPSULE BY MOUTH DAILY Patient taking differently: Take 30 mg by mouth daily. Total daily dose 90 mg 08/17/23   Austine Lefort, MD  DULoxetine  (CYMBALTA ) 60 MG capsule TAKE 1 CAPSULE BY MOUTH DAILY IN ADDITION TO CYMBALTA  30 MG TO EQUAL A TOTAL DOSE OF 90 MG PER DAY Patient taking differently: Take 60 mg by mouth daily. Total daily dose of 90 mg 08/17/23   Austine Lefort, MD  ezetimibe  (ZETIA ) 10 MG tablet TAKE 1 TABLET (10 MG TOTAL) BY MOUTH DAILY. 09/19/22   Austine Lefort, MD  gabapentin  (NEURONTIN ) 800 MG tablet TAKE 1  TABLET BY MOUTH 3 TIMES DAILY 04/19/23   Austine Lefort, MD  ketoconazole  (NIZORAL ) 2 % cream Apply 1 Application topically 2 (two) times daily. 06/13/23   Hyatt, Max T, DPM  meloxicam  (MOBIC ) 7.5 MG tablet Take 1 tablet (7.5 mg total) by mouth daily. 08/03/23   Austine Lefort, MD  pantoprazole  (PROTONIX ) 40 MG tablet TAKE 1 TABLET BY MOUTH ONCE DAILY 07/19/23   Austine Lefort, MD  rosuvastatin  (CRESTOR ) 40 MG tablet TAKE 1 TABLET BY MOUTH IN THE EVENING 07/19/23   Austine Lefort, MD    Physical Exam: BP (!) 152/90 (BP Location: Left Arm)   Pulse 96   Temp 97.8 F (36.6 C) (  Oral)   Resp 20   Ht 4\' 11"  (1.499 m)   Wt 77.5 kg   SpO2 95%   BMI 34.51 kg/m  General:  Alert, oriented, calm, in no acute distress, elderly female appearing her stated age Cardiovascular: RRR, no murmurs or rubs, no peripheral edema  Respiratory: clear to auscultation bilaterally, no wheezes, no crackles  Abdomen: soft, nontender, nondistended, normal bowel tones heard  Skin: dry, no rashes  Musculoskeletal: no joint effusions, normal range of motion  Psychiatric: appropriate affect, normal speech  Neurologic: extraocular muscles intact, clear speech, moving all extremities with intact sensorium         Labs on Admission:  Basic Metabolic Panel: Recent Labs  Lab 08/24/23 0411  NA 140  K 3.9  CL 101  CO2 32  GLUCOSE 149*  BUN 17  CREATININE 0.93  CALCIUM  9.3   Liver Function Tests: Recent Labs  Lab 08/24/23 0411  AST 28  ALT 15  ALKPHOS 64  BILITOT 0.6  PROT 7.1  ALBUMIN  4.1   No results for input(s): "LIPASE", "AMYLASE" in the last 168 hours. No results for input(s): "AMMONIA" in the last 168 hours. CBC: Recent Labs  Lab 08/24/23 0411  WBC 8.7  NEUTROABS 4.5  HGB 13.2  HCT 43.1  MCV 93.9  PLT 201   Cardiac Enzymes: No results for input(s): "CKTOTAL", "CKMB", "CKMBINDEX", "TROPONINI" in the last 168 hours. BNP (last 3 results) Recent Labs    04/01/23 0847  07/08/23 0800  BNP 154.5* 83.0    ProBNP (last 3 results) No results for input(s): "PROBNP" in the last 8760 hours.  CBG: No results for input(s): "GLUCAP" in the last 168 hours.  Radiological Exams on Admission: CT Angio Chest/Abd/Pel for Dissection W and/or Wo Contrast Result Date: 08/24/2023 CLINICAL DATA:  Evaluate for acute aortic syndrome. Complains of right flank pain. Worse this morning. Pain is exacerbated by taking a deep breath. EXAM: CT ANGIOGRAPHY CHEST, ABDOMEN AND PELVIS TECHNIQUE: Non-contrast CT of the chest was initially obtained. Multidetector CT imaging through the chest, abdomen and pelvis was performed using the standard protocol during bolus administration of intravenous contrast. Multiplanar reconstructed images and MIPs were obtained and reviewed to evaluate the vascular anatomy. RADIATION DOSE REDUCTION: This exam was performed according to the departmental dose-optimization program which includes automated exposure control, adjustment of the mA and/or kV according to patient size and/or use of iterative reconstruction technique. CONTRAST:  OMNIPAQUE  IOHEXOL  350 MG/ML SOLN COMPARISON:  07/08/2023 FINDINGS: CTA CHEST FINDINGS Cardiovascular: Preferential opacification of the thoracic aorta. No evidence of thoracic aortic aneurysm or dissection. Extensive atherosclerotic calcifications and aortic tortuosity identified. Coronary artery calcifications. No pericardial effusion. Main pulmonary artery and its proximal branches appear patent without signs of incidental pulmonary embolus. Mediastinum/Nodes: No enlarged mediastinal, hilar, or axillary lymph nodes. Thyroid  gland, trachea, and esophagus demonstrate no significant findings. Lungs/Pleura: Some retained secretions are noted along the left lateral wall of the distal trachea. Central airways appear patent. No pleural effusion, airspace consolidation or pneumothorax. Diffuse bronchial wall thickening noted. Mild patchy  ground-glass density noted within the anterior right upper lobe. Musculoskeletal: Osteopenia. Vertebral plana deformity is noted at the T12 level which is unchanged from the previous exam. No acute or suspicious osseous findings. Review of the MIP images confirms the above findings. CTA ABDOMEN AND PELVIS FINDINGS VASCULAR Aorta: Extensive aortic atherosclerosis. Abdominal aortic aneurysm is again identified. Supra renal abdominal aorta measures 3.4 cm, image 59/8. Infrarenal abdominal aortic aneurysm measures 5.8 cm, image  69/8.1 no signs of dissection. No periaortic fluid or soft tissue edema. 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 No signs of dissection. 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 Renals: Moderate proximal stenosis of bilateral renal arteries. No dissection or aneurysm. Patent. Multifocal mild stenosis. IMA: Patent.  Multifocal mild-to-moderate stenosis as before. Inflow: No significant stenosis. Veins: No obvious venous abnormality within the limitations of this arterial phase study. Review of the MIP images confirms the above findings. NON-VASCULAR Hepatobiliary: Mild lobular contour of the liver. No focal liver lesion identified. Cholecystectomy. Common bile duct measures up to 1 cm. No calcified common bile duct stones identified. Pancreas: Unremarkable. No pancreatic ductal dilatation or surrounding inflammatory changes. Spleen: Normal in size without focal abnormality. Adrenals/Urinary Tract: Normal adrenal glands. Thin, curvilinear calcifications within the lower pole of the right kidney may be vascular nature, image 43/8. No signs of obstructive uropathy. Inferior pole right kidney cyst compatible with a Bosniak class 1 lesion. This measures 2.2 cm. No follow-up imaging recommended. No focal bladder abnormality. Stomach/Bowel: Stomach appears normal. The appendix is not visualized. No secondary signs of acute appendicitis.  No bowel wall thickening, inflammation, or distension. No signs of pneumatosis. Left-sided colonic diverticulosis. No signs of acute diverticulitis. Lymphatic: No signs of abdominopelvic adenopathy. Reproductive: Status post hysterectomy. No adnexal masses. Other: No free fluid or fluid collections. No signs of pneumoperitoneum. Musculoskeletal: Status post left hip arthroplasty. Remote healed left inferior pubic rami fracture. Chronic moderate L5 compression deformity. Marked thoracolumbar spondylosis. Leftward curvature of the lumbar spine. Review of the MIP images confirms the above findings. IMPRESSION: 1. No signs of thoracic aortic aneurysm or dissection. 2. Abdominal aortic aneurysm measuring up to 5.8 cm infrarenal. Recommend follow-up every 6 months and vascular consultation. 3. 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 4. 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 5. Moderate proximal stenosis of bilateral renal arteries. 6. Left-sided colonic diverticulosis. No signs of acute diverticulitis. 7. Chronic vertebral plana deformity at the T12 level. Chronic moderate L5 compression deformity. 8.  Aortic Atherosclerosis (ICD10-I70.0). Electronically Signed   By: Kimberley Penman M.D.   On: 08/24/2023 05:30   Assessment/Plan [name] is a 89 y.o. female with medical history significant for [name], CVA, CKD, hyperlipidemia, heart failure with preserved EF, atrial fibrillation on anticoagulation, dementia, presented to the ER with complaints of right-sided flank pain found to have UTI.  UTI-with right-sided flank pain, though no etiology for pain seen on CT imaging.  Likely due to UTI in the setting of hypotension, abnormal urinalysis and flank pain. -Observation admission -Follow-up blood and urine cultures -Continue treatment with empiric IV Rocephin   Abdominal aortic aneurysm-measuring up to 5.8 cm infrarenal, outpatient vascular surgery  follow-up recommended.  Heart failure with preserved EF-patient appears euvolemic on exam -Continue aspirin , atenolol   Hyperlipidemia-Crestor , Zetia   GERD-Protonix   Neuropathy-gabapentin   Depression-Cymbalta   DVT prophylaxis: Lovenox      Code Status: Limited: Do not attempt resuscitation (DNR) -DNR-LIMITED -Do Not Intubate/DNI   Consults called: None  Admission status: Observation  Time spent: 59 minutes  Melroy Bougher Rickey Charm MD [facility] Pager [phone]  If 7PM-7AM, please contact night-coverage [email or web address] Password [email or web address]  08/24/2023, 1:06 PM

## 2023-08-24 NOTE — ED Provider Notes (Signed)
 [place] EMERGENCY DEPARTMENT AT [facility] Provider Note   CSN: [id number] Arrival date & time: 08/24/23  3267     History  Chief Complaint  Patient presents with   Rt. flank pain    [name] is a 89 y.o. female.  The history is provided by the patient and a relative.  Flank Pain This is a new problem. The current episode started more than 2 days ago. The problem occurs constantly. The problem has not changed since onset.Pertinent negatives include no chest pain, no abdominal pain, no headaches and no shortness of breath. Nothing aggravates the symptoms. Nothing relieves the symptoms. She has tried nothing for the symptoms. The treatment provided no relief.  Patient with [name] presents with right flank pain.  No n/v/d.  No fevers.      Past Medical History:  Diagnosis Date   [name] (abdominal aortic aneurysm) (HCC) 09/2013   3.6 cm   Acute and chronic respiratory failure with hypoxia (HCC)    Acute delirium    Acute encephalopathy    Acute pyelonephritis    Atrial fibrillation (HCC)    Chronic pain syndrome    Constipation    Dementia (HCC)    Dependent edema    bilateral legs    Depression    Diabetes mellitus    pre-diabetes   Diverticulosis    Diverticulosis    Dysrhythmia    Dysuria    E coli bacteremia    Fall    Gross hematuria    Hydronephrosis with renal and ureteral calculous obstruction    Hyperlipidemia    Hypertension    Hypoxemia    Idiopathic peripheral autonomic neuropathy    Infestation by bed bug    Nephrolithiasis    OSA on CPAP    Osteoarthritis    Osteopenia    T=  -2.1 in hip   Persistent mood (affective) disorder, unspecified (HCC)    Prediabetes    Rheumatoid arthritis (HCC)    Rheumatoid arthritis(714.0)    S/P carpal tunnel release    Sepsis secondary to UTI [facility])    Spinal stenosis    Unspecified fracture of fifth lumbar vertebra, initial encounter for closed fracture (HCC)    Unspecified fracture of t11-T12  vertebra, initial encounter for closed fracture (HCC)    Urinary incontinence    Urinary tract infection    hx of    Urolithiasis      Home Medications Prior to Admission medications   Medication Sig Start Date End Date Taking? Authorizing Provider  albuterol  (VENTOLIN  HFA) 108 (90 Base) MCG/ACT inhaler Inhale 2 puffs into the lungs every 4 (four) hours as needed for wheezing or shortness of breath. 02/01/23   Austine Lefort, MD  amoxicillin -clavulanate (AUGMENTIN ) 875-125 MG tablet Take 1 tablet by mouth 2 (two) times daily. 07/18/23   Austine Lefort, MD  aspirin  EC 81 MG tablet Take 1 tablet (81 mg total) by mouth daily. 04/03/23   Vann, Jessica U, DO  atenolol  (TENORMIN ) 25 MG tablet TAKE ONE-HALF TABLET BY MOUTH DAILY 06/20/23   Austine Lefort, MD  Calcium  Carbonate-Vitamin D3 600-400 MG-UNIT TABS Take 1 tablet by mouth daily. 02/04/20   [provider]  clotrimazole -betamethasone  (LOTRISONE ) cream Apply 1 Application topically daily as needed (fungal infection).    [provider]  DULoxetine  (CYMBALTA ) 30 MG capsule TAKE 1 CAPSULE BY MOUTH DAILY 08/17/23   Austine Lefort, MD  DULoxetine  (CYMBALTA ) 60 MG capsule TAKE 1 CAPSULE BY  MOUTH DAILY IN ADDITION TO CYMBALTA  30 MG TO EQUAL A TOTAL DOSE OF 90 MG PER DAY 08/17/23   Austine Lefort, MD  ezetimibe  (ZETIA ) 10 MG tablet TAKE 1 TABLET (10 MG TOTAL) BY MOUTH DAILY. 09/19/22   Austine Lefort, MD  gabapentin  (NEURONTIN ) 800 MG tablet TAKE 1 TABLET BY MOUTH 3 TIMES DAILY 04/19/23   Austine Lefort, MD  ketoconazole  (NIZORAL ) 2 % cream Apply 1 Application topically 2 (two) times daily. 06/13/23   Hyatt, Max T, DPM  meloxicam  (MOBIC ) 7.5 MG tablet Take 1 tablet (7.5 mg total) by mouth daily. 08/03/23   Austine Lefort, MD  pantoprazole  (PROTONIX ) 40 MG tablet TAKE 1 TABLET BY MOUTH ONCE DAILY 07/19/23   Austine Lefort, MD  rosuvastatin  (CRESTOR ) 40 MG tablet TAKE 1 TABLET BY MOUTH IN THE EVENING 07/19/23   Austine Lefort, MD      Allergies    Sulfonamide derivatives    Review of Systems   Review of Systems  Constitutional:  Negative for fever.  Respiratory:  Negative for shortness of breath.   Cardiovascular:  Negative for chest pain.  Gastrointestinal:  Negative for abdominal pain.  Genitourinary:  Positive for flank pain.  Neurological:  Negative for headaches.  All other systems reviewed and are negative.   Physical Exam Updated Vital Signs BP 129/84   Pulse 99   Temp (!) 97.4 F (36.3 C)   Resp (!) 27   Ht 4\' 11"  (1.499 m)   Wt 75 kg   SpO2 99%   BMI 33.40 kg/m  Physical Exam Vitals and nursing note reviewed.  Constitutional:      General: She is not in acute distress.    Appearance: Normal appearance. She is well-developed.  HENT:     Head: Normocephalic and atraumatic.     Nose: Nose normal.  Eyes:     Pupils: Pupils are equal, round, and reactive to light.  Cardiovascular:     Rate and Rhythm: Normal rate and regular rhythm.     Pulses: Normal pulses.     Heart sounds: Normal heart sounds.  Pulmonary:     Effort: Pulmonary effort is normal. No respiratory distress.     Breath sounds: Normal breath sounds.  Abdominal:     General: Bowel sounds are normal. There is no distension.     Palpations: Abdomen is soft.     Tenderness: There is no abdominal tenderness. There is no guarding or rebound.  Musculoskeletal:        General: Normal range of motion.     Cervical back: Normal range of motion and neck supple.  Skin:    General: Skin is dry.     Capillary Refill: Capillary refill takes less than 2 seconds.     Findings: No erythema or rash.  Neurological:     General: No focal deficit present.     Mental Status: She is alert.     Deep Tendon Reflexes: Reflexes normal.  Psychiatric:        Mood and Affect: Mood normal.     ED Results / Procedures / Treatments   Labs (all labs ordered are listed, but only abnormal results are displayed) Results for orders  placed or performed during the hospital encounter of 08/24/23  Urinalysis, Routine w reflex microscopic -Urine, Clean Catch   Collection Time: 08/24/23  3:52 AM  Result Value Ref Range   Color, Urine YELLOW YELLOW   APPearance CLEAR CLEAR  Specific Gravity, Urine 1.026 1.005 - 1.030   pH 5.5 5.0 - 8.0   Glucose, UA NEGATIVE NEGATIVE mg/dL   Hgb urine dipstick NEGATIVE NEGATIVE   Bilirubin Urine NEGATIVE NEGATIVE   Ketones, ur NEGATIVE NEGATIVE mg/dL   Protein, ur TRACE (A) NEGATIVE mg/dL   Nitrite POSITIVE (A) NEGATIVE   Leukocytes,Ua SMALL (A) NEGATIVE   RBC / HPF 0-5 0 - 5 RBC/hpf   WBC, UA 21-50 0 - 5 WBC/hpf   Bacteria, UA MANY (A) NONE SEEN   Squamous Epithelial / HPF 0-5 0 - 5 /HPF   Mucus PRESENT    Hyaline Casts, UA PRESENT    Ca Oxalate Crys, UA PRESENT   Comprehensive metabolic panel   Collection Time: 08/24/23  4:11 AM  Result Value Ref Range   Sodium 140 135 - 145 mmol/L   Potassium 3.9 3.5 - 5.1 mmol/L   Chloride 101 98 - 111 mmol/L   CO2 32 22 - 32 mmol/L   Glucose, Bld 149 (H) 70 - 99 mg/dL   BUN 17 8 - 23 mg/dL   Creatinine, Ser 4.09 0.44 - 1.00 mg/dL   Calcium  9.3 8.9 - 10.3 mg/dL   Total Protein 7.1 6.5 - 8.1 g/dL   Albumin  4.1 3.5 - 5.0 g/dL   AST 28 15 - 41 U/L   ALT 15 0 - 44 U/L   Alkaline Phosphatase 64 38 - 126 U/L   Total Bilirubin 0.6 0.0 - 1.2 mg/dL   GFR, Estimated 59 (L) >60 mL/min   Anion gap 8 5 - 15  CBC with Differential   Collection Time: 08/24/23  4:11 AM  Result Value Ref Range   WBC 8.7 4.0 - 10.5 K/uL   RBC 4.59 3.87 - 5.11 MIL/uL   Hemoglobin 13.2 12.0 - 15.0 g/dL   HCT 81.1 91.4 - 78.2 %   MCV 93.9 80.0 - 100.0 fL   MCH 28.8 26.0 - 34.0 pg   MCHC 30.6 30.0 - 36.0 g/dL   RDW 95.6 21.3 - 08.6 %   Platelets 201 150 - 400 K/uL   nRBC 0.0 0.0 - 0.2 %   Neutrophils Relative % 52 %   Neutro Abs 4.5 1.7 - 7.7 K/uL   Lymphocytes Relative 37 %   Lymphs Abs 3.2 0.7 - 4.0 K/uL   Monocytes Relative 7 %   Monocytes Absolute 0.6 0.1  - 1.0 K/uL   Eosinophils Relative 3 %   Eosinophils Absolute 0.2 0.0 - 0.5 K/uL   Basophils Relative 1 %   Basophils Absolute 0.1 0.0 - 0.1 K/uL   Immature Granulocytes 0 %   Abs Immature Granulocytes 0.02 0.00 - 0.07 K/uL   CT Angio Chest/Abd/Pel for Dissection W and/or Wo Contrast Result Date: 08/24/2023 CLINICAL DATA:  Evaluate for acute aortic syndrome. Complains of right flank pain. Worse this morning. Pain is exacerbated by taking a deep breath. EXAM: CT ANGIOGRAPHY CHEST, ABDOMEN AND PELVIS TECHNIQUE: Non-contrast CT of the chest was initially obtained. Multidetector CT imaging through the chest, abdomen and pelvis was performed using the standard protocol during bolus administration of intravenous contrast. Multiplanar reconstructed images and MIPs were obtained and reviewed to evaluate the vascular anatomy. RADIATION DOSE REDUCTION: This exam was performed according to the departmental dose-optimization program which includes automated exposure control, adjustment of the mA and/or kV according to patient size and/or use of iterative reconstruction technique. CONTRAST:  OMNIPAQUE  IOHEXOL  350 MG/ML SOLN COMPARISON:  07/08/2023 FINDINGS: CTA CHEST FINDINGS Cardiovascular: Preferential  opacification of the thoracic aorta. No evidence of thoracic aortic aneurysm or dissection. Extensive atherosclerotic calcifications and aortic tortuosity identified. Coronary artery calcifications. No pericardial effusion. Main pulmonary artery and its proximal branches appear patent without signs of incidental pulmonary embolus. Mediastinum/Nodes: No enlarged mediastinal, hilar, or axillary lymph nodes. Thyroid  gland, trachea, and esophagus demonstrate no significant findings. Lungs/Pleura: Some retained secretions are noted along the left lateral wall of the distal trachea. Central airways appear patent. No pleural effusion, airspace consolidation or pneumothorax. Diffuse bronchial wall thickening noted. Mild  patchy ground-glass density noted within the anterior right upper lobe. Musculoskeletal: Osteopenia. Vertebral plana deformity is noted at the T12 level which is unchanged from the previous exam. No acute or suspicious osseous findings. Review of the MIP images confirms the above findings. CTA ABDOMEN AND PELVIS FINDINGS VASCULAR Aorta: Extensive aortic atherosclerosis. Abdominal aortic aneurysm is again identified. Supra renal abdominal aorta measures 3.4 cm, image 59/8. Infrarenal abdominal aortic aneurysm measures 5.8 cm, image 69/8.1 no signs of dissection. No periaortic fluid or soft tissue edema. 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 No signs of dissection. 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 Renals: Moderate proximal stenosis of bilateral renal arteries. No dissection or aneurysm. Patent. Multifocal mild stenosis. IMA: Patent.  Multifocal mild-to-moderate stenosis as before. Inflow: No significant stenosis. Veins: No obvious venous abnormality within the limitations of this arterial phase study. Review of the MIP images confirms the above findings. NON-VASCULAR Hepatobiliary: Mild lobular contour of the liver. No focal liver lesion identified. Cholecystectomy. Common bile duct measures up to 1 cm. No calcified common bile duct stones identified. Pancreas: Unremarkable. No pancreatic ductal dilatation or surrounding inflammatory changes. Spleen: Normal in size without focal abnormality. Adrenals/Urinary Tract: Normal adrenal glands. Thin, curvilinear calcifications within the lower pole of the right kidney may be vascular nature, image 43/8. No signs of obstructive uropathy. Inferior pole right kidney cyst compatible with a Bosniak class 1 lesion. This measures 2.2 cm. No follow-up imaging recommended. No focal bladder abnormality. Stomach/Bowel: Stomach appears normal. The appendix is not visualized. No secondary signs of acute  appendicitis. No bowel wall thickening, inflammation, or distension. No signs of pneumatosis. Left-sided colonic diverticulosis. No signs of acute diverticulitis. Lymphatic: No signs of abdominopelvic adenopathy. Reproductive: Status post hysterectomy. No adnexal masses. Other: No free fluid or fluid collections. No signs of pneumoperitoneum. Musculoskeletal: Status post left hip arthroplasty. Remote healed left inferior pubic rami fracture. Chronic moderate L5 compression deformity. Marked thoracolumbar spondylosis. Leftward curvature of the lumbar spine. Review of the MIP images confirms the above findings. IMPRESSION: 1. No signs of thoracic aortic aneurysm or dissection. 2. Abdominal aortic aneurysm measuring up to 5.8 cm infrarenal. Recommend follow-up every 6 months and vascular consultation. 3. 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 4. 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 5. Moderate proximal stenosis of bilateral renal arteries. 6. Left-sided colonic diverticulosis. No signs of acute diverticulitis. 7. Chronic vertebral plana deformity at the T12 level. Chronic moderate L5 compression deformity. 8.  Aortic Atherosclerosis (ICD10-I70.0). Electronically Signed   By: Kimberley Penman M.D.   On: 08/24/2023 05:30     Radiology CT Angio Chest/Abd/Pel for Dissection W and/or Wo Contrast Result Date: 08/24/2023 CLINICAL DATA:  Evaluate for acute aortic syndrome. Complains of right flank pain. Worse this morning. Pain is exacerbated by taking a deep breath. EXAM: CT ANGIOGRAPHY CHEST, ABDOMEN AND PELVIS TECHNIQUE:  Non-contrast CT of the chest was initially obtained. Multidetector CT imaging through the chest, abdomen and pelvis was performed using the standard protocol during bolus administration of intravenous contrast. Multiplanar reconstructed images and MIPs were obtained and reviewed to evaluate the vascular anatomy. RADIATION DOSE  REDUCTION: This exam was performed according to the departmental dose-optimization program which includes automated exposure control, adjustment of the mA and/or kV according to patient size and/or use of iterative reconstruction technique. CONTRAST:  OMNIPAQUE  IOHEXOL  350 MG/ML SOLN COMPARISON:  07/08/2023 FINDINGS: CTA CHEST FINDINGS Cardiovascular: Preferential opacification of the thoracic aorta. No evidence of thoracic aortic aneurysm or dissection. Extensive atherosclerotic calcifications and aortic tortuosity identified. Coronary artery calcifications. No pericardial effusion. Main pulmonary artery and its proximal branches appear patent without signs of incidental pulmonary embolus. Mediastinum/Nodes: No enlarged mediastinal, hilar, or axillary lymph nodes. Thyroid  gland, trachea, and esophagus demonstrate no significant findings. Lungs/Pleura: Some retained secretions are noted along the left lateral wall of the distal trachea. Central airways appear patent. No pleural effusion, airspace consolidation or pneumothorax. Diffuse bronchial wall thickening noted. Mild patchy ground-glass density noted within the anterior right upper lobe. Musculoskeletal: Osteopenia. Vertebral plana deformity is noted at the T12 level which is unchanged from the previous exam. No acute or suspicious osseous findings. Review of the MIP images confirms the above findings. CTA ABDOMEN AND PELVIS FINDINGS VASCULAR Aorta: Extensive aortic atherosclerosis. Abdominal aortic aneurysm is again identified. Supra renal abdominal aorta measures 3.4 cm, image 59/8. Infrarenal abdominal aortic aneurysm measures 5.8 cm, image 69/8.1 no signs of dissection. No periaortic fluid or soft tissue edema. 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 No signs of dissection. 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 Renals: Moderate proximal stenosis of bilateral renal arteries. No  dissection or aneurysm. Patent. Multifocal mild stenosis. IMA: Patent.  Multifocal mild-to-moderate stenosis as before. Inflow: No significant stenosis. Veins: No obvious venous abnormality within the limitations of this arterial phase study. Review of the MIP images confirms the above findings. NON-VASCULAR Hepatobiliary: Mild lobular contour of the liver. No focal liver lesion identified. Cholecystectomy. Common bile duct measures up to 1 cm. No calcified common bile duct stones identified. Pancreas: Unremarkable. No pancreatic ductal dilatation or surrounding inflammatory changes. Spleen: Normal in size without focal abnormality. Adrenals/Urinary Tract: Normal adrenal glands. Thin, curvilinear calcifications within the lower pole of the right kidney may be vascular nature, image 43/8. No signs of obstructive uropathy. Inferior pole right kidney cyst compatible with a Bosniak class 1 lesion. This measures 2.2 cm. No follow-up imaging recommended. No focal bladder abnormality. Stomach/Bowel: Stomach appears normal. The appendix is not visualized. No secondary signs of acute appendicitis. No bowel wall thickening, inflammation, or distension. No signs of pneumatosis. Left-sided colonic diverticulosis. No signs of acute diverticulitis. Lymphatic: No signs of abdominopelvic adenopathy. Reproductive: Status post hysterectomy. No adnexal masses. Other: No free fluid or fluid collections. No signs of pneumoperitoneum. Musculoskeletal: Status post left hip arthroplasty. Remote healed left inferior pubic rami fracture. Chronic moderate L5 compression deformity. Marked thoracolumbar spondylosis. Leftward curvature of the lumbar spine. Review of the MIP images confirms the above findings. IMPRESSION: 1. No signs of thoracic aortic aneurysm or dissection. 2. Abdominal aortic aneurysm measuring up to 5.8 cm infrarenal. Recommend follow-up every 6 months and vascular consultation. 3. High-grade stenosis at the origin of the  celiac artery approximately 60-70% as before. 4. 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 5. Moderate proximal stenosis  of bilateral renal arteries. 6. Left-sided colonic diverticulosis. No signs of acute diverticulitis. 7. Chronic vertebral plana deformity at the T12 level. Chronic moderate L5 compression deformity. 8.  Aortic Atherosclerosis (ICD10-I70.0). Electronically Signed   By: Kimberley Penman M.D.   On: 08/24/2023 05:30    Procedures Procedures    Medications Ordered in ED Medications  fentaNYL  (SUBLIMAZE ) injection 50 mcg (50 mcg Intravenous Given 08/24/23 0418)  ondansetron  (ZOFRAN ) injection 4 mg (4 mg Intravenous Given 08/24/23 0418)  iohexol  (OMNIPAQUE ) 350 MG/ML injection 100 mL (100 mLs Intravenous Contrast Given 08/24/23 0445)  sodium chloride  0.9 % bolus 1,000 mL (0 mLs Intravenous Stopped 08/24/23 0559)  cefTRIAXone  (ROCEPHIN ) 2 g in sodium chloride  0.9 % 100 mL IVPB (0 g Intravenous Stopped 08/24/23 0559)  acetaminophen  (TYLENOL ) tablet 1,000 mg (1,000 mg Oral Given 08/24/23 0601)  ketorolac  (TORADOL ) 15 MG/ML injection 15 mg (15 mg Intravenous Given 08/24/23 0601)    EKG Interpretation Date/Time:  Thursday Amellia Panik 24 2025 04:59:50 EDT Ventricular Rate:  106 PR Interval:    QRS Duration:  88 QT Interval:  335 QTC Calculation: 445 R Axis:   55  Text Interpretation: multifocal atrial  Antidromic AV  reentry tachycardia (uses an accessory pathway)  Low voltage, precordial leads  Confirmed by Maralee Senate, Johnrobert Foti ([id number]) on 08/24/2023 6:23:01 AM         ED Course/ Medical Decision Making/ A&P                                 Medical Decision Making Patient with flank pain for 2 or so days with [name]  Amount and/or Complexity of Data Reviewed Independent Historian:     Details: Family member see above  External Data Reviewed: notes.    Details: Previous notes reviewed  Labs: ordered.    Details: Urine is consistent with UTI, normal white count 8.7  normal hemoglobin 13.2, normal platelets normal sodium 140, normal potassium 3.9 normal creatinine 0.93  Radiology: ordered and independent interpretation performed.    Details: [name] by me on CTA ECG/medicine tests: ordered and independent interpretation performed. Decision-making details documented in ED Course.  Risk OTC drugs. Prescription drug management. Decision regarding hospitalization. Risk Details: Will treat for UTI and admit to medicine as pain is not controlled and patient has a h/o e coli sepsis.      Final Clinical Impression(s) / ED Diagnoses Final diagnoses:  Urinary tract infection without hematuria, site unspecified   The patient appears reasonably stabilized for admission considering the current resources, flow, and capabilities available in the ED at this time, and I doubt any other [facility] requiring further screening and/or treatment in the ED prior to admission.  Rx / DC Orders ED Discharge Orders     None         Sinclair Arrazola, MD 08/24/23 [phone]

## 2023-08-24 NOTE — ED Notes (Signed)
 CT called to expedite CT scan per MD Palumbo.

## 2023-08-24 NOTE — ED Notes (Signed)
 RT  Note: Patient found on 2lpm [place] with an SPO2 100%. She is tolerating well at this time and stated the oxygen seems to make her feel better

## 2023-08-24 NOTE — ED Notes (Signed)
 Strong pedal pulses found- marked. Pt undressed, zoll pads placed,

## 2023-08-24 NOTE — Plan of Care (Signed)
 Plan of Care Note for accepted transfer  Patient: [name]    ZOX:[id number]  DOA: 08/24/2023     Facility requesting transfer: [facility] ED  Requesting Provider: Tonya Fredrickson, MD  Reason for transfer: UTI / hypotension   Facility course:   35 F with hx HTN, [name], diastolic HF, history of CVA, CKD, Afib not on AC due to frequent falls, recent UTI w Kleb pna (amp R, otherwise S). P/w flank pain. Initially hypotensive, got 1 L IVF with improvement. UA c/w UTI, Ucx pending. Imaging w no findings of pyelo. + incidental vascular findings, follows w vascular OP for known [name]. Treated with CTX.   Plan of care: The patient is accepted for obs to Med-surg  unit, at [facility]   Author: Arnulfo Larch, MD  08/24/2023  Check [email or web address] for on-call coverage.  Nursing staff, Please call [facility] Admits & Consults System-Wide number on Amion as soon as patient's arrival, so appropriate admitting provider can evaluate the pt.

## 2023-08-24 NOTE — Plan of Care (Signed)

## 2023-08-24 NOTE — ED Notes (Signed)
 Patient transported to CT with RN and MD and remained on monitor.

## 2023-08-24 NOTE — Progress Notes (Signed)
 Pt arrived to [facility] oriented A/O 1. Pt groggy and unable to complete admission questions at this time. RN left a voicemail for pt's son Shiela Dooms) on the number - [phone] for a call back.

## 2023-08-25 DIAGNOSIS — N390 Urinary tract infection, site not specified: Secondary | ICD-10-CM | POA: Diagnosis not present

## 2023-08-25 DIAGNOSIS — N3000 Acute cystitis without hematuria: Secondary | ICD-10-CM | POA: Diagnosis not present

## 2023-08-25 LAB — BASIC METABOLIC PANEL WITH GFR
Anion gap: 10 (ref 5–15)
BUN: 19 mg/dL (ref 8–23)
CO2: 26 mmol/L (ref 22–32)
Calcium: 8.3 mg/dL — ABNORMAL LOW (ref 8.9–10.3)
Chloride: 101 mmol/L (ref 98–111)
Creatinine, Ser: 0.91 mg/dL (ref 0.44–1.00)
GFR, Estimated: >60 mL/min (ref >60)
Glucose, Bld: 113 mg/dL — ABNORMAL HIGH (ref 70–99)
Potassium: 4.3 mmol/L (ref 3.5–5.1)
Sodium: 137 mmol/L (ref 135–145)

## 2023-08-25 LAB — CBC
HCT: 38.7 % (ref 36.0–46.0)
HCT: 39.2 % (ref 36.0–46.0)
Hemoglobin: 11.5 g/dL — ABNORMAL LOW (ref 12.0–15.0)
Hemoglobin: 11.3 g/dL — ABNORMAL LOW (ref 12.0–15.0)
MCH: 29.3 pg (ref 26.0–34.0)
MCH: 29.2 pg (ref 26.0–34.0)
MCHC: 29.7 g/dL — ABNORMAL LOW (ref 30.0–36.0)
MCHC: 28.8 g/dL — ABNORMAL LOW (ref 30.0–36.0)
MCV: 98.5 fL (ref 80.0–100.0)
MCV: 101.3 fL — ABNORMAL HIGH (ref 80.0–100.0)
Platelets: 146 10*3/uL — ABNORMAL LOW (ref 150–400)
Platelets: 148 10*3/uL — ABNORMAL LOW (ref 150–400)
RBC: 3.93 MIL/uL (ref 3.87–5.11)
RBC: 3.87 MIL/uL (ref 3.87–5.11)
RDW: 14.5 % (ref 11.5–15.5)
RDW: 14.6 % (ref 11.5–15.5)
WBC: 5.7 10*3/uL (ref 4.0–10.5)
WBC: 5.9 10*3/uL (ref 4.0–10.5)
nRBC: 0 % (ref 0.0–0.2)
nRBC: 0 % (ref 0.0–0.2)

## 2023-08-25 LAB — BRAIN NATRIURETIC PEPTIDE: B Natriuretic Peptide: 69.4 pg/mL (ref 0.0–100.0)

## 2023-08-25 MED ORDER — IPRATROPIUM-ALBUTEROL 0.5-2.5 (3) MG/3ML IN SOLN
3.0000 mL | Freq: Three times a day (TID) | RESPIRATORY_TRACT | Status: DC
  Administered 2023-08-25 – 2023-08-27 (×6): 3 mL via RESPIRATORY_TRACT
  Filled 2023-08-25 (×8): qty 3

## 2023-08-25 MED ORDER — HYDRALAZINE HCL 20 MG/ML IJ SOLN: 10.0000 mg | INTRAMUSCULAR | Status: DC | PRN

## 2023-08-25 MED ORDER — IPRATROPIUM-ALBUTEROL 0.5-2.5 (3) MG/3ML IN SOLN: 3.0000 mL | RESPIRATORY_TRACT | Status: DC | PRN

## 2023-08-25 MED ORDER — FUROSEMIDE 10 MG/ML IJ SOLN
40.0000 mg | Freq: Once | INTRAMUSCULAR | Status: AC
  Administered 2023-08-25: 40 mg via INTRAVENOUS
  Filled 2023-08-25: qty 4

## 2023-08-25 MED ORDER — METOPROLOL TARTRATE 5 MG/5ML IV SOLN: 5.0000 mg | INTRAVENOUS | Status: DC | PRN

## 2023-08-25 NOTE — TOC Initial Note (Addendum)
 Transition of Care [facility]) - Initial/Assessment Note    Patient Details  Name: [name] MRN: [id number] Date of Birth: Sep 09, 1933  Transition of Care [facility]) CM/SW Contact:    Kathryn Parish, RN Phone Number: 08/25/2023, 11:36 AM  Clinical Narrative:                 MOON; Pt from home; lives in mobile home with son; PCP, insurance, verified; DME walker, wheelchair. No HH or SDOH. Patients son will transport home at discharge. TOC will follow for [facility] needs.  Expected Discharge Plan: Home/Self Care Barriers to Discharge: Continued Medical Work up   Patient Goals and CMS Choice Patient states their goals for this hospitalization and ongoing recovery are:: Home with my sons   Choice offered to / list presented to : NA      Expected Discharge Plan and Services                         DME Arranged: N/A DME Agency: NA         HH Agency: NA        Prior Living Arrangements/Services   Lives with:: Adult Children Patient language and need for interpreter reviewed:: Yes Do you feel safe going back to the place where you live?: Yes      Need for Family Participation in Patient Care: Yes (Comment) Care giver support system in place?: Yes (comment)   Criminal Activity/Legal Involvement Pertinent to Current Situation/Hospitalization: No - Comment as needed  Activities of Daily Living   ADL Screening (condition at time of admission) Independently performs ADLs?: No Does the patient have a NEW difficulty with bathing/dressing/toileting/self-feeding that is expected to last >3 days?: No Does the patient have a NEW difficulty with getting in/out of bed, walking, or climbing stairs that is expected to last >3 days?: No Does the patient have a NEW difficulty with communication that is expected to last >3 days?: No Is the patient deaf or have difficulty hearing?: Yes Does the patient have difficulty seeing, even when wearing glasses/contacts?: No Does the patient have  difficulty concentrating, remembering, or making decisions?: Yes  Permission Sought/Granted Permission sought to share information with : Case Manager Permission granted to share information with : Yes, Verbal Permission Granted  Share Information with NAME: Fabiene Holt and Dallie Duel     Permission granted to share info w Relationship: Sons  Permission granted to share info w Contact Information: [phone]  Emotional Assessment Appearance:: Appears stated age Attitude/Demeanor/Rapport: Engaged Affect (typically observed): Accepting, Appropriate Orientation: : Oriented to Self, Oriented to Place, Oriented to  Time, Oriented to Situation Alcohol / Substance Use: Not Applicable Psych Involvement: No (comment)  Admission diagnosis:  Urinary tract infection [N39.0] Urinary tract infection without hematuria, site unspecified [N39.0] Patient Active Problem List   Diagnosis Date Noted   Urinary tract infection 08/24/2023   Unilateral primary osteoarthritis, left knee 07/09/2023   Influenza A 07/08/2023   History of URI (upper respiratory infection) 04/12/2023   Acute hypoxic respiratory failure (HCC) 03/31/2023   Dysphagia 01/17/2023   Class 1 obesity 10/20/2021   Heart failure (HCC) 10/20/2021   Hypertension    Acute cystitis without hematuria    Stage 3a chronic kidney disease (CKD) (HCC) 02/13/2021   Dehydration 02/13/2021   Lactic acidosis 02/13/2021   Hypokalemia 02/13/2021   Metabolic alkalosis 02/13/2021   Acute metabolic encephalopathy 02/13/2021   Closed left hip fracture (HCC) 08/25/2020   Hyperglycemia 08/25/2020  Elevated brain natriuretic peptide (BNP) level 08/25/2020   GERD (gastroesophageal reflux disease) 08/25/2020   Cerebrovascular accident (CVA) due to stenosis of basilar artery (HCC) 07/17/2019   Dyspnea 07/09/2019   Chronic diastolic CHF (congestive heart failure) (HCC) 07/09/2019   Chest pain    Coronary artery calcification seen on CT scan    Gait  abnormality 01/24/2019   Tremor 01/24/2019   Memory loss 01/24/2019   Chronic bilateral low back pain without sciatica 06/06/2018   At risk for falls 06/06/2018   Senile osteoporosis 06/06/2018   Dementia (HCC) 06/06/2018   Atelectasis of left lung 04/06/2018   Acute encephalopathy 02/09/2018   ESBL (extended spectrum beta-lactamase) producing bacteria infection 02/02/2018   Atrial fibrillation, chronic (HCC) 02/02/2018   Constipation 01/31/2018   Urolithiasis 01/30/2018   T12 compression fracture (HCC) 09/04/2017   Fall at home, initial encounter 07/03/2017   Tachycardia 02/03/2017   Chronic pain syndrome 02/02/2017   Dizziness 12/15/2016   Inflammation of sacroiliac joint (HCC) 05/07/2015   Lumbar spondylosis with myelopathy 02/11/2014   Peripheral polyneuropathy 02/11/2014   Opioid dependence (HCC) 02/11/2014   Abnormal EKG 11/21/2013   Displacement of lumbar intervertebral disc without myelopathy 11/18/2013   Lumbar radiculopathy 11/18/2013   [name] (abdominal aortic aneurysm) (HCC) 09/30/2013   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HCC) 01/03/2013   OSA (obstructive sleep apnea) 06/29/2012   Pre-diabetes 06/29/2012   Hypoxia, sleep related 06/29/2012   RA (rheumatoid arthritis) (HCC) 06/29/2012   Nonspecific abnormal results of cardiovascular function study 06/02/2011   Nonspecific abnormal electrocardiogram (ECG) (EKG) 05/18/2011   Osteoporosis 04/09/2008   Hyperlipidemia 05/12/2006   DEPENDENT EDEMA, LEGS, BILATERAL 05/12/2006   PERIPHERAL NEUROPATHY 03/11/2006   Essential hypertension 03/11/2006   PCP:  Austine Lefort, MD Pharmacy:   [facility] [phone] - [place], [place] - [address] [place] [address] Phone: [phone] Fax: [phone]  [facility] - [address] [place] - [address] [place] [address] Phone: [phone] Fax: [phone]     Social Drivers  of Health (SDOH) Social History: SDOH Screenings   Food Insecurity: No Food Insecurity (08/24/2023)  Housing: Low Risk  (08/24/2023)  Transportation Needs: No Transportation Needs (08/24/2023)  Utilities: Not At Risk (08/24/2023)  Alcohol Screen: Low Risk  (04/19/2022)  Depression (PHQ2-9): Low Risk  (07/18/2023)  Financial Resource Strain: Low Risk  (04/27/2023)  Physical Activity: Inactive (04/27/2023)  Social Connections: Socially Isolated (08/24/2023)  Stress: No Stress Concern Present (04/27/2023)  Tobacco Use: Low Risk  (08/24/2023)  Health Literacy: Inadequate Health Literacy (04/27/2023)   SDOH Interventions: Food Insecurity Interventions: Intervention Not Indicated Housing Interventions: Intervention Not Indicated Transportation Interventions: Intervention Not Indicated Utilities Interventions: Intervention Not Indicated Social Connections Interventions: Intervention Not Indicated   Readmission Risk Interventions    02/16/2021   10:54 AM  Readmission Risk Prevention Plan  Transportation Screening Complete  HRI or Home Care Consult Complete  Social Work Consult for Recovery Care Planning/Counseling Complete  Palliative Care Screening Not Applicable  Medication Review Oceanographer) Complete

## 2023-08-25 NOTE — Evaluation (Signed)
 Physical Therapy Evaluation Patient Details Name: [name] MRN: [id number] DOB: 1934-02-02 Today's Date: 08/25/2023  History of Present Illness  89 y.o. female admitted with flank pain, Dx of UTI.  medical history significant for [name], CVA, CKD, hyperlipidemia, heart failure with preserved EF, atrial fibrillation on anticoagulation, dementia.  Clinical Impression  Pt admitted with above diagnosis. Pt ambulated 12' from bathroom to recliner, distance limited by R flank pain and 3/4 dyspnea, expiratory wheezing noted. SpO2 88% on room air, ~1 minute after walking, SpO2 up to 95% on 2L O2 with pursed lip breathing. Pt lives with her 2 sons and has 24* assistance available. At baseline she ambulates household distances with a walker and supervision, she uses a WC when going out.  Pt currently with functional limitations due to the deficits listed below (see PT Problem List). Pt will benefit from acute skilled PT to increase their independence and safety with mobility to allow discharge.           If plan is discharge home, recommend the following: A little help with walking and/or transfers;A little help with bathing/dressing/bathroom;Assistance with cooking/housework;Assist for transportation;Help with stairs or ramp for entrance   Can travel by private vehicle        Equipment Recommendations None recommended by PT  Recommendations for Other Services       Functional Status Assessment Patient has had a recent decline in their functional status and demonstrates the ability to make significant improvements in function in a reasonable and predictable amount of time.     Precautions / Restrictions Precautions Precautions: Fall Recall of Precautions/Restrictions: Impaired Precaution/Restrictions Comments: monitor O2 Restrictions Weight Bearing Restrictions Per Provider Order: No      Mobility  Bed Mobility               General bed mobility comments: up on commode     Transfers Overall transfer level: Needs assistance Equipment used: Rolling walker (2 wheels) Transfers: Sit to/from Stand Sit to Stand: Mod assist           General transfer comment: Mod A to power up from commode, VCs hand placement    Ambulation/Gait Ambulation/Gait assistance: Contact guard assist Gait Distance (Feet): 12 Feet Assistive device: Rolling walker (2 wheels) Gait Pattern/deviations: Step-through pattern, Decreased stride length, Trunk flexed Gait velocity: decr     General Gait Details: wheezing noted while walking, SpO2 88% on room air after walking bathroom to recliner, 95% on 2L O2 with seated rest and verbal cues for pursed lip breathing; distance limited by pain and fatigue  Stairs            Wheelchair Mobility     Tilt Bed    Modified Rankin (Stroke Patients Only)       Balance Overall balance assessment: Needs assistance Sitting-balance support: No upper extremity supported, Feet supported Sitting balance-Leahy Scale: Good     Standing balance support: Bilateral upper extremity supported, Reliant on assistive device for balance, During functional activity Standing balance-Leahy Scale: Poor                               Pertinent Vitals/Pain Pain Assessment Pain Assessment: Faces Faces Pain Scale: Hurts even more Pain Location: R flank Pain Descriptors / Indicators: Grimacing, Guarding Pain Intervention(s): Monitored during session, Limited activity within patient's tolerance, Repositioned, Patient requesting pain meds-RN notified    Home Living Family/patient expects to be discharged to:: Private residence Living  Arrangements: Children Available Help at Discharge: Family;Available 24 hours/day;Personal care attendant Type of Home: Mobile home Home Access: Ramped entrance       Home Layout: One level Home Equipment: Grab bars - toilet;Grab bars - tub/shower;Tub bench;Rolling Walker (2 wheels);Wheelchair -  manual;Hospital bed;Lift chair Additional Comments: Pt lives with 2 sons who are available all day, PCA M-F 11-4 to help with cooking/cleaning/bathing.    Prior Function Prior Level of Function : Needs assist       Physical Assist : Mobility (physical);ADLs (physical) Mobility (physical): Transfers;Gait;Stairs;Bed mobility ADLs (physical): Grooming;Bathing;Dressing;Toileting;IADLs Mobility Comments: uses RW, doesn't go out much but does use w/c for doctor appointments. Has someone with her for all mobility. Daughter reports pt has likely had several falls in past 6 months. Pt lives with sons. ADLs Comments: PCA helps with cooking/cleaning and bathing, uses     Extremity/Trunk Assessment   Upper Extremity Assessment Upper Extremity Assessment: Defer to OT evaluation    Lower Extremity Assessment Lower Extremity Assessment: Generalized weakness    Cervical / Trunk Assessment Cervical / Trunk Assessment: Normal  Communication   Communication Communication: Impaired Factors Affecting Communication: Hearing impaired    Cognition Arousal: Alert Behavior During Therapy: WFL for tasks assessed/performed   PT - Cognitive impairments: Difficult to assess Difficult to assess due to: Hard of hearing/deaf                       Following commands: Impaired       Cueing       General Comments      Exercises     Assessment/Plan    PT Assessment Patient needs continued PT services  PT Problem List Decreased activity tolerance;Decreased balance;Pain;Decreased mobility;Cardiopulmonary status limiting activity       PT Treatment Interventions Gait training;Therapeutic exercise;Patient/family education;Therapeutic activities;Functional mobility training;DME instruction    PT Goals (Current goals can be found in the Care Plan section)  Acute Rehab PT Goals Patient Stated Goal: improve breathing/decrease pain PT Goal Formulation: With patient/family Time For Goal  Achievement: 09/08/23 Potential to Achieve Goals: Good    Frequency Min 3X/week     Co-evaluation               AM-PAC PT "6 Clicks" Mobility  Outcome Measure Help needed turning from your back to your side while in a flat bed without using bedrails?: A Little Help needed moving from lying on your back to sitting on the side of a flat bed without using bedrails?: A Little Help needed moving to and from a bed to a chair (including a wheelchair)?: A Little Help needed standing up from a chair using your arms (e.g., wheelchair or bedside chair)?: A Lot Help needed to walk in hospital room?: A Little Help needed climbing 3-5 steps with a railing? : A Lot 6 Click Score: 16    End of Session Equipment Utilized During Treatment: Gait belt Activity Tolerance: Patient limited by pain;Patient limited by fatigue Patient left: in chair;with chair alarm set;with call bell/phone within reach;with family/visitor present Nurse Communication: Mobility status PT Visit Diagnosis: Difficulty in walking, not elsewhere classified (R26.2);Pain    Time: 4782-9562 PT Time Calculation (min) (ACUTE ONLY): 17 min   Charges:   PT Evaluation $PT Eval Moderate Complexity: 1 Mod   PT General Charges $$ ACUTE PT VISIT: 1 Visit         Daymon Evans PT 08/25/2023  Acute Rehabilitation Services  Office [phone]

## 2023-08-25 NOTE — Progress Notes (Signed)
 PROGRESS NOTE    [name]  ZOX:[id number] DOB: [birth year] DOA: 08/24/2023 PCP: Austine Lefort, MD    Brief Narrative:   89 year old with history of [name], CVA, CKD, [name], CHF with preserved EF, A-fib not on anticoagulation due to frequent falls, dementia comes to the ED with right-sided flank pain  Assessment & Plan:  Principal Problem:   Urinary tract infection     Right-sided flank pain likely from urinary tract infection -Although on CT scan there is no obvious evidence of pyelonephritis, UA suggestive of urinary tract infection.  Cultures during this visit is growing E. 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  Overall patient is a high risk and a poor surgical candidate.   Abnormal breath sounds -She is having bilateral expiratory wheezing.  I suspect this could be secondary to fluid.  Will check BNP.  If elevated we could give Lasix .  Bronchodilators prescribed.  Out of bed to chair.  CKD stage IIIa -Creatinine remains around baseline of 1.0   Chronic atrial fibrillation -Can continue home medication.  Not on anticoagulation due to multiple falls   History of CVA Hyperlipidemia - Continue statin, aspirin , Zetia    Chronic knee pain secondary to osteoarthritis Chronic low back pain -Pain control.  Gabapentin    GERD - PPI  PT/OT  DVT prophylaxis: Lovenox      Code Status: Limited: Do not attempt resuscitation (DNR) -DNR-LIMITED -Do Not Intubate/DNI  Family Communication:   Continue hospital stay for at least 24 hours  Subjective:  Still feels very weak.  Examination:  General exam: Appears calm and comfortable  Respiratory system: Diffuse bilateral expiratory wheezing Cardiovascular system: S1 & S2 heard, RRR. No JVD, murmurs, rubs, gallops or clicks. No pedal edema. Gastrointestinal system: Abdomen is  nondistended, soft and nontender. No organomegaly or masses felt. Normal bowel sounds heard. Central nervous system: Alert and oriented. No focal neurological deficits. Extremities: Symmetric 5 x 5 power. Skin: No rashes, lesions or ulcers Psychiatry: Judgement and insight appear normal. Mood & affect appropriate.                Diet Orders (From admission, onward)     Start     Ordered   08/24/23 1115  Diet Heart Room service appropriate? Yes; Fluid consistency: Thin  Diet effective now       Question Answer Comment  Room service appropriate? Yes   Fluid consistency: Thin      08/24/23 1114            Objective: Vitals:   08/24/23 2210 08/25/23 0447 08/25/23 0919 08/25/23 1107  BP: (!) 146/95 116/76 116/76   Pulse: 89 96 96 92  Resp: 16 18  18   Temp: 98.4 F (36.9 C) 98 F (36.7 C)    TempSrc:      SpO2: 100% 90%  96%  Weight:      Height:        Intake/Output Summary (Last 24 hours) at 08/25/2023 1132 Last data filed at 08/24/2023 2025 Gross per 24 hour  Intake 180 ml  Output --  Net 180 ml   Filed Weights   08/24/23 0347 08/24/23 1036  Weight: 75 kg 77.5 kg    Scheduled Meds:  aspirin  EC  81 mg Oral Daily   atenolol   12.5 mg Oral Daily   DULoxetine   90 mg Oral Daily   enoxaparin  (LOVENOX ) injection  40 mg  Subcutaneous Q24H   ezetimibe   10 mg Oral Daily   gabapentin   800 mg Oral TID   ipratropium-albuterol   3 mL Nebulization TID   pantoprazole   40 mg Oral Daily   rosuvastatin   40 mg Oral QPM   Continuous Infusions:  cefTRIAXone  (ROCEPHIN )  IV 1 g (08/25/23 0416)    Nutritional status     Body mass index is 34.51 kg/m.  Data Reviewed:   CBC: Recent Labs  Lab 08/24/23 0411 08/25/23 0355 08/25/23 0945  WBC 8.7 5.7 5.9  NEUTROABS 4.5  --   --   HGB 13.2 11.5* 11.3*  HCT 43.1 38.7 39.2  MCV 93.9 98.5 101.3*  PLT 201 146* 148*   Basic Metabolic Panel: Recent Labs  Lab 08/24/23 0411 08/25/23 0355  NA 140 137  K 3.9 4.3   CL 101 101  CO2 32 26  GLUCOSE 149* 113*  BUN 17 19  CREATININE 0.93 0.91  CALCIUM  9.3 8.3*   GFR: Estimated Creatinine Clearance: 39.1 mL/min (by C-G formula based on SCr of 0.91 mg/dL). Liver Function Tests: Recent Labs  Lab 08/24/23 0411  AST 28  ALT 15  ALKPHOS 64  BILITOT 0.6  PROT 7.1  ALBUMIN  4.1   No results for input(s): "LIPASE", "AMYLASE" in the last 168 hours. No results for input(s): "AMMONIA" in the last 168 hours. Coagulation Profile: No results for input(s): "INR", "PROTIME" in the last 168 hours. Cardiac Enzymes: No results for input(s): "CKTOTAL", "CKMB", "CKMBINDEX", "TROPONINI" in the last 168 hours. BNP (last 3 results) No results for input(s): "PROBNP" in the last 8760 hours. HbA1C: No results for input(s): "HGBA1C" in the last 72 hours. CBG: No results for input(s): "GLUCAP" in the last 168 hours. Lipid Profile: No results for input(s): "CHOL", "HDL", "LDLCALC", "TRIG", "CHOLHDL", "LDLDIRECT" in the last 72 hours. Thyroid  Function Tests: No results for input(s): "TSH", "T4TOTAL", "FREET4", "T3FREE", "THYROIDAB" in the last 72 hours. Anemia Panel: No results for input(s): "VITAMINB12", "FOLATE", "FERRITIN", "TIBC", "IRON", "RETICCTPCT" in the last 72 hours. Sepsis Labs: No results for input(s): "PROCALCITON", "LATICACIDVEN" in the last 168 hours.  Recent Results (from the past 240 hours)  Urine Culture     Status: Abnormal (Preliminary result)   Collection Time: 08/24/23  4:14 AM   Specimen: Urine, Clean Catch  Result Value Ref Range Status   Specimen Description   Final    URINE, CLEAN CATCH Performed at [facility], [address] [place], [place] [address]    Special Requests   Final    Normal Performed at [facility], [address] [place], [place] [address]    Culture >=100,000 COLONIES/mL ESCHERICHIA COLI (A)  Final   Report Status PENDING  Incomplete  Blood culture (routine x 2)      Status: None (Preliminary result)   Collection Time: 08/24/23  5:00 AM   Specimen: BLOOD LEFT ARM  Result Value Ref Range Status   Specimen Description   Final    BLOOD LEFT ARM Performed at [facility], [address] [place], [place] [address]    Special Requests   Final    BOTTLES DRAWN AEROBIC AND ANAEROBIC Blood Culture results may not be optimal due to an inadequate volume of blood received in culture bottles Performed at [facility], [address], [place], [place] [address]    Culture   Final    NO GROWTH < 24 HOURS Performed at [facility], [address] [place], [place]  [address]    Report Status PENDING  Incomplete  Blood culture (routine x 2)     Status: None (Preliminary result)   Collection Time: 08/24/23  5:20 AM   Specimen: BLOOD RIGHT ARM  Result Value Ref Range Status   Specimen Description   Final    BLOOD RIGHT ARM Performed at [facility], [address], [place], [place] [address]    Special Requests   Final    BOTTLES DRAWN AEROBIC AND ANAEROBIC Blood Culture results may not be optimal due to an inadequate volume of blood received in culture bottles Performed at [facility], [address] [place], [place] [address]    Culture   Final    NO GROWTH < 24 HOURS Performed at [facility], [address] [place], [place] [address]    Report Status PENDING  Incomplete         Radiology Studies: CT Angio Chest/Abd/Pel for Dissection W and/or Wo Contrast Result Date: 08/24/2023 CLINICAL DATA:  Evaluate for acute aortic syndrome. Complains of right flank pain. Worse this morning. Pain is exacerbated by taking a deep breath. EXAM: CT ANGIOGRAPHY CHEST, ABDOMEN AND PELVIS TECHNIQUE: Non-contrast CT of the chest was initially obtained. Multidetector CT imaging through the chest, abdomen and pelvis was performed using the standard protocol during bolus  administration of intravenous contrast. Multiplanar reconstructed images and MIPs were obtained and reviewed to evaluate the vascular anatomy. RADIATION DOSE REDUCTION: This exam was performed according to the departmental dose-optimization program which includes automated exposure control, adjustment of the mA and/or kV according to patient size and/or use of iterative reconstruction technique. CONTRAST:  OMNIPAQUE  IOHEXOL  350 MG/ML SOLN COMPARISON:  07/08/2023 FINDINGS: CTA CHEST FINDINGS Cardiovascular: Preferential opacification of the thoracic aorta. No evidence of thoracic aortic aneurysm or dissection. Extensive atherosclerotic calcifications and aortic tortuosity identified. Coronary artery calcifications. No pericardial effusion. Main pulmonary artery and its proximal branches appear patent without signs of incidental pulmonary embolus. Mediastinum/Nodes: No enlarged mediastinal, hilar, or axillary lymph nodes. Thyroid  gland, trachea, and esophagus demonstrate no significant findings. Lungs/Pleura: Some retained secretions are noted along the left lateral wall of the distal trachea. Central airways appear patent. No pleural effusion, airspace consolidation or pneumothorax. Diffuse bronchial wall thickening noted. Mild patchy ground-glass density noted within the anterior right upper lobe. Musculoskeletal: Osteopenia. Vertebral plana deformity is noted at the T12 level which is unchanged from the previous exam. No acute or suspicious osseous findings. Review of the MIP images confirms the above findings. CTA ABDOMEN AND PELVIS FINDINGS VASCULAR Aorta: Extensive aortic atherosclerosis. Abdominal aortic aneurysm is again identified. Supra renal abdominal aorta measures 3.4 cm, image 59/8. Infrarenal abdominal aortic aneurysm measures 5.8 cm, image 69/8.1 no signs of dissection. No periaortic fluid or soft tissue edema. 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 No signs of dissection. 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 Renals: Moderate proximal stenosis of bilateral renal arteries. No dissection or aneurysm. Patent. Multifocal mild stenosis. IMA: Patent.  Multifocal mild-to-moderate stenosis as before. Inflow: No significant stenosis. Veins: No obvious venous abnormality within the limitations of this arterial phase study. Review of the MIP images confirms the above findings. NON-VASCULAR Hepatobiliary: Mild lobular contour of the liver. No focal liver lesion identified. Cholecystectomy. Common bile duct measures up to 1 cm. No calcified common bile duct stones identified. Pancreas: Unremarkable. No pancreatic ductal dilatation or surrounding inflammatory changes. Spleen: Normal in size without focal  abnormality. Adrenals/Urinary Tract: Normal adrenal glands. Thin, curvilinear calcifications within the lower pole of the right kidney may be vascular nature, image 43/8. No signs of obstructive uropathy. Inferior pole right kidney cyst compatible with a Bosniak class 1 lesion. This measures 2.2 cm. No follow-up imaging recommended. No focal bladder abnormality. Stomach/Bowel: Stomach appears normal. The appendix is not visualized. No secondary signs of acute appendicitis. No bowel wall thickening, inflammation, or distension. No signs of pneumatosis. Left-sided colonic diverticulosis. No signs of acute diverticulitis. Lymphatic: No signs of abdominopelvic adenopathy. Reproductive: Status post hysterectomy. No adnexal masses. Other: No free fluid or fluid collections. No signs of pneumoperitoneum. Musculoskeletal: Status post left hip arthroplasty. Remote healed left inferior pubic rami fracture. Chronic moderate L5 compression deformity. Marked thoracolumbar spondylosis. Leftward curvature of the lumbar spine. Review of the MIP images confirms the above findings. IMPRESSION: 1. No signs of thoracic aortic aneurysm or dissection. 2. Abdominal aortic  aneurysm measuring up to 5.8 cm infrarenal. Recommend follow-up every 6 months and vascular consultation. 3. 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 4. 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 5. Moderate proximal stenosis of bilateral renal arteries. 6. Left-sided colonic diverticulosis. No signs of acute diverticulitis. 7. Chronic vertebral plana deformity at the T12 level. Chronic moderate L5 compression deformity. 8.  Aortic Atherosclerosis (ICD10-I70.0). Electronically Signed   By: Kimberley Penman M.D.   On: 08/24/2023 05:30           LOS: 0 days   Time spent= 35 mins    Maggie Schooner, MD [facility]  If 7PM-7AM, please contact night-coverage  08/25/2023, 11:32 AM

## 2023-08-25 NOTE — Evaluation (Signed)
 Occupational Therapy Evaluation Patient Details Name: [name] MRN: [id number] DOB: Dec 09, 1933 Today's Date: 08/25/2023   History of Present Illness   89 y.o. female admitted with flank pain, Dx of UTI.  medical history significant for [name], CVA, CKD, hyperlipidemia, heart failure with preserved EF, atrial fibrillation on anticoagulation, dementia.     Clinical Impressions PTA, patient lives with sons and has 5x per week assistance from PCA for shower 1 x per week and IADL's. Patient was ambulatory with RW and could complete BADL's including toileting and UB self care independently reporting she did not use O2 but does have available in the home. Patient presents with deficits outlined below (see OT Problem List for details) most significantly decreased safety, balance, activity tolerance now O2 dependent, pain, generalized weakness impacting ADL's and basic functional mobility. Patient requires continued Acute skilled OT services to progress towards baseline function and HHOT services with garegiver support and assistance upon discharge.      If plan is discharge home, recommend the following:   A lot of help with walking and/or transfers;A lot of help with bathing/dressing/bathroom;Assistance with cooking/housework;Assistance with feeding;Direct supervision/assist for financial management;Direct supervision/assist for medications management;Assist for transportation;Help with stairs or ramp for entrance;Supervision due to cognitive status     Functional Status Assessment   Patient has had a recent decline in their functional status and demonstrates the ability to make significant improvements in function in a reasonable and predictable amount of time.     Equipment Recommendations   None recommended by OT      Precautions/Restrictions   Precautions Precautions: Fall Recall of Precautions/Restrictions: Impaired Precaution/Restrictions Comments: monitor  O2 Restrictions Weight Bearing Restrictions Per Provider Order: No     Mobility Bed Mobility Overal bed mobility: Needs Assistance Bed Mobility: Rolling, Supine to Sit, Sit to Supine Rolling: Supervision, Used rails   Supine to sit: Supervision, HOB elevated, Used rails Sit to supine: Supervision, HOB elevated, Used rails        Transfers Overall transfer level: Needs assistance Equipment used: Rolling walker (2 wheels) Transfers: Sit to/from Stand, Bed to chair/wheelchair/BSC Sit to Stand: Mod assist     Step pivot transfers: Mod assist     General transfer comment: amb to and from bathroom with RW and O2 with mod a      Balance Overall balance assessment: Needs assistance Sitting-balance support: No upper extremity supported, Feet supported Sitting balance-Leahy Scale: Good     Standing balance support: Bilateral upper extremity supported, Reliant on assistive device for balance, During functional activity Standing balance-Leahy Scale: Poor                             ADL either performed or assessed with clinical judgement   ADL Overall ADL's : Needs assistance/impaired Eating/Feeding: Set up;Sitting   Grooming: Wash/dry hands;Wash/dry face;Oral care;Sitting;Set up   Upper Body Bathing: Contact guard assist;Sitting   Lower Body Bathing: Maximal assistance;Sitting/lateral leans;Sit to/from stand   Upper Body Dressing : Minimal assistance;Sitting   Lower Body Dressing: Maximal assistance;Sitting/lateral leans;Sit to/from stand   Toilet Transfer: Moderate assistance;Rolling walker (2 wheels);Regular Toilet;Grab bars   Toileting- Clothing Manipulation and Hygiene: Moderate assistance;Sitting/lateral lean;Sit to/from stand       Functional mobility during ADLs: Moderate assistance;Rolling walker (2 wheels) General ADL Comments: decreased functional reach and overall weakness     Vision Baseline Vision/History: 1 Wears glasses Ability to See  in Adequate Light: 0 Adequate Patient Visual Report:  No change from baseline Vision Assessment?: No apparent visual deficits;Wears glasses for reading     Perception Perception: Within Functional Limits       Praxis Praxis: [facility]       Pertinent Vitals/Pain Pain Assessment Pain Assessment: Faces Faces Pain Scale: Hurts little more Pain Location: R flank Pain Descriptors / Indicators: Grimacing, Guarding Pain Intervention(s): Limited activity within patient's tolerance, Repositioned, Relaxation, Premedicated before session     Extremity/Trunk Assessment Upper Extremity Assessment Upper Extremity Assessment: Right hand dominant;Generalized weakness   Lower Extremity Assessment Lower Extremity Assessment: Generalized weakness   Cervical / Trunk Assessment Cervical / Trunk Assessment: Normal   Communication Communication Communication: Impaired Factors Affecting Communication: Hearing impaired   Cognition Arousal: Alert Behavior During Therapy: WFL for tasks assessed/performed Cognition: History of cognitive impairments             OT - Cognition Comments: cues for sequencing and higher level recall, can provide most history including pets, family, aides schedule                 Following commands: Impaired Following commands impaired: Follows one step commands with increased time     Cueing  General Comments   Cueing Techniques: Verbal cues  O2 sats remained >92 % with O2 in place, mild SOB           Home Living Family/patient expects to be discharged to:: Private residence Living Arrangements: Children Available Help at Discharge: Family;Available 24 hours/day;Personal care attendant Type of Home: Mobile home Home Access: Ramped entrance     Home Layout: One level     Bathroom Shower/Tub: Tub/shower unit;Curtain   Bathroom Toilet: Handicapped height Bathroom Accessibility: Yes How Accessible: Accessible via walker Home Equipment: Grab bars  - toilet;Grab bars - tub/shower;Tub bench;Rolling Walker (2 wheels);Wheelchair - manual;Hospital bed;Lift chair   Additional Comments: Pt lives with 2 sons who are available all day, PCA M-F 11-4 to help with cooking/cleaning/bathing.      Prior Functioning/Environment Prior Level of Function : Needs assist  Cognitive Assist : ADLs (cognitive);Mobility (cognitive) Mobility (Cognitive): Intermittent cues ADLs (Cognitive): Intermittent cues Physical Assist : Mobility (physical);ADLs (physical) Mobility (physical): Transfers;Gait;Stairs;Bed mobility ADLs (physical): Grooming;Bathing;Dressing;Toileting;IADLs Mobility Comments: uses RW, doesn't go out much but does use w/c for doctor appointments. Has someone with her for all mobility. Daughter reports pt has likely had several falls in past 6 months. Pt lives with sons. ADLs Comments: PCA helps with cooking/cleaning and bathing (showers 1x per week)    OT Problem List: Decreased strength;Decreased activity tolerance;Impaired balance (sitting and/or standing);Decreased coordination;Decreased cognition;Decreased safety awareness;Decreased knowledge of use of DME or AE;Decreased knowledge of precautions;Cardiopulmonary status limiting activity;Obesity;Pain   OT Treatment/Interventions: Self-care/ADL training;Therapeutic exercise;Neuromuscular education;Energy conservation;DME and/or AE instruction;Therapeutic activities;Cognitive remediation/compensation;Patient/family education;Balance training      OT Goals(Current goals can be found in the care plan section)   Acute Rehab OT Goals Patient Stated Goal: to go home stronger OT Goal Formulation: With patient Time For Goal Achievement: 09/08/23 Potential to Achieve Goals: Good ADL Goals Pt Will Perform Lower Body Bathing: with contact guard assist;sitting/lateral leans;sit to/from stand Pt Will Perform Lower Body Dressing: with adaptive equipment;sitting/lateral leans;sit to/from stand;with  min assist Pt Will Transfer to Toilet: with supervision;ambulating;grab bars Pt/caregiver will Perform Home Exercise Program: Increased strength;With Supervision;With written HEP provided   OT Frequency:  Min 2X/week       AM-PAC OT "6 Clicks" Daily Activity     Outcome Measure Help from another person eating meals?: A Little  Help from another person taking care of personal grooming?: A Little Help from another person toileting, which includes using toliet, bedpan, or urinal?: A Lot Help from another person bathing (including washing, rinsing, drying)?: A Lot Help from another person to put on and taking off regular upper body clothing?: A Little Help from another person to put on and taking off regular lower body clothing?: A Lot 6 Click Score: 15   End of Session Equipment Utilized During Treatment: Gait belt;Rolling walker (2 wheels);Oxygen Nurse Communication: Mobility status (voiding data entered on flowsheets)  Activity Tolerance: Patient limited by fatigue;Patient limited by pain Patient left: in bed;with call bell/phone within reach;with bed alarm set  OT Visit Diagnosis: Unsteadiness on feet (R26.81);Muscle weakness (generalized) (M62.81);Pain;Cognitive communication deficit (R41.841) Pain - Right/Left: Right Pain - part of body:  (flank)                Time: 1610-9604 OT Time Calculation (min): 32 min Charges:  OT General Charges $OT Visit: 1 Visit OT Evaluation $OT Eval Moderate Complexity: 1 Mod OT Treatments $Self Care/Home Management : 8-22 mins  Candido Flott OT/L Acute Rehabilitation Department  [phone]  08/25/2023, 5:05 PM

## 2023-08-25 NOTE — Plan of Care (Signed)
   Problem: Education: Goal: Knowledge of General Education information will improve Description Including pain rating scale, medication(s)/side effects and non-pharmacologic comfort measures Outcome: Progressing   Problem: Health Behavior/Discharge Planning: Goal: Ability to manage health-related needs will improve Outcome: Progressing

## 2023-08-25 NOTE — Care Management Obs Status (Signed)
 MEDICARE OBSERVATION STATUS NOTIFICATION   Patient Details  Name: [name] MRN: [id number] Date of Birth: 25-Feb-1934   Medicare Observation Status Notification Given:  Yes    Kathryn Parish, RN 08/25/2023, 10:44 AM

## 2023-08-25 NOTE — Plan of Care (Signed)

## 2023-08-25 NOTE — Hospital Course (Addendum)
 Brief Narrative:   89 year old with history of [name], CVA, CKD, [name], CHF with preserved EF, A-fib not on anticoagulation due to frequent falls, dementia comes to the ED with right-sided flank pain. Although on CT scan there is no obvious evidence of pyelonephritis, UA suggestive of urinary tract infection.  Cultures again this admission are growing E. coli.  Currently on empiric Rocephin   Assessment & Plan:  Principal Problem:   Urinary tract infection     Right-sided flank pain likely from urinary tract infection -Although on CT scan there is no obvious evidence of pyelonephritis, UA suggestive of urinary tract infection.  Cultures again this admission are growing E. coli.  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  Overall patient is a high risk and a poor surgical candidate.   Abnormal breath sounds -She is having bilateral expiratory wheezing.  I suspect this could be secondary to fluid.  Will check BNP.  If elevated we could give Lasix .  Bronchodilators prescribed.  Out of bed to chair.  CKD stage IIIa -Creatinine remains around baseline of 1.0   Chronic atrial fibrillation -Can continue home medication.  Not on anticoagulation due to multiple falls   History of CVA Hyperlipidemia - Continue statin, aspirin , Zetia    Chronic knee pain secondary to osteoarthritis Chronic low back pain -Pain control.  Gabapentin    GERD - PPI  PT/OT = HH, f100f completed.   DVT prophylaxis: Lovenox  DNR/DNI Family Communication:  Called Liza Continue hospital stay for at least 24 hours  Subjective:  Still weak.   Examination:  General exam: Appears calm and comfortable  Respiratory system: Diffuse bilateral expiratory wheezing Cardiovascular system: S1 & S2 heard, RRR. No JVD, murmurs, rubs, gallops or clicks. No pedal  edema. Gastrointestinal system: Abdomen is nondistended, soft and nontender. No organomegaly or masses felt. Normal bowel sounds heard. Central nervous system: Alert and oriented. No focal neurological deficits. Extremities: Symmetric 5 x 5 power. Skin: No rashes, lesions or ulcers Psychiatry: Judgement and insight appear normal. Mood & affect appropriate.

## 2023-08-26 DIAGNOSIS — N3000 Acute cystitis without hematuria: Secondary | ICD-10-CM | POA: Diagnosis not present

## 2023-08-26 DIAGNOSIS — N390 Urinary tract infection, site not specified: Secondary | ICD-10-CM | POA: Diagnosis not present

## 2023-08-26 LAB — BASIC METABOLIC PANEL WITH GFR
Anion gap: 10 (ref 5–15)
BUN: 18 mg/dL (ref 8–23)
CO2: 31 mmol/L (ref 22–32)
Calcium: 8.6 mg/dL — ABNORMAL LOW (ref 8.9–10.3)
Chloride: 98 mmol/L (ref 98–111)
Creatinine, Ser: 1.03 mg/dL — ABNORMAL HIGH (ref 0.44–1.00)
GFR, Estimated: 53 mL/min — ABNORMAL LOW (ref >60)
Glucose, Bld: 109 mg/dL — ABNORMAL HIGH (ref 70–99)
Potassium: 4.8 mmol/L (ref 3.5–5.1)
Sodium: 139 mmol/L (ref 135–145)

## 2023-08-26 LAB — MAGNESIUM: Magnesium: 1.7 mg/dL (ref 1.7–2.4)

## 2023-08-26 LAB — URINE CULTURE
Culture: >=100000 — AB
Special Requests: NORMAL

## 2023-08-26 MED ORDER — PREDNISONE 20 MG PO TABS
40.0000 mg | ORAL_TABLET | Freq: Every day | ORAL | Status: DC
  Administered 2023-08-26 – 2023-08-27 (×2): 40 mg via ORAL
  Filled 2023-08-26 (×2): qty 2

## 2023-08-26 NOTE — TOC Progression Note (Signed)
 Transition of Care [facility]) - Progression Note    Patient Details  Name: [name] MRN: [id number] Date of Birth: 11/02/1933  Transition of Care [facility]) CM/SW Contact  Katrine Parody, LCSW Phone Number: 08/26/2023, 5:07 PM  Clinical Narrative:    PT recommended HHPT, CSW reviewed chart, pt lethargic today and resting.  CSW contacted son who assists with care plan, discussed HHPT.  Son states family in agreement and pt agreeable when DC ready. CSW contacted [facility], declined , Then [facility]- no repsonse.  [facility] was able to accept.  Will need orders.  TOC to follow.    Expected Discharge Plan: Home/Self Care Barriers to Discharge: Continued Medical Work up  Expected Discharge Plan and Services                         DME Arranged: N/A DME Agency: NA         HH Agency: NA         Social Determinants of Health (SDOH) Interventions SDOH Screenings   Food Insecurity: No Food Insecurity (08/24/2023)  Housing: Low Risk  (08/24/2023)  Transportation Needs: No Transportation Needs (08/24/2023)  Utilities: Not At Risk (08/24/2023)  Alcohol Screen: Low Risk  (04/19/2022)  Depression (PHQ2-9): Low Risk  (07/18/2023)  Financial Resource Strain: Low Risk  (04/27/2023)  Physical Activity: Inactive (04/27/2023)  Social Connections: Socially Isolated (08/24/2023)  Stress: No Stress Concern Present (04/27/2023)  Tobacco Use: Low Risk  (08/24/2023)  Health Literacy: Inadequate Health Literacy (04/27/2023)    Readmission Risk Interventions    02/16/2021   10:54 AM  Readmission Risk Prevention Plan  Transportation Screening Complete  HRI or Home Care Consult Complete  Social Work Consult for Recovery Care Planning/Counseling Complete  Palliative Care Screening Not Applicable  Medication Review [employer]) Complete

## 2023-08-26 NOTE — Plan of Care (Signed)

## 2023-08-26 NOTE — Progress Notes (Signed)
 Mobility Specialist - Progress Note  (RA) Pre-mobility: 93 bpm HR, 88% SpO2 (2L [place]) Pre-mobility: 96% SpO2 During mobility: 107 bpm HR, 93% SpO2 Post-mobility: 105 bpm HR, 98% SPO2   08/26/23 1407  Mobility  Activity Ambulated with assistance in room  Level of Assistance Minimal assist, patient does 75% or more  Assistive Device Front wheel walker  Distance Ambulated (ft) 20 ft  Range of Motion/Exercises Active  Activity Response Tolerated well  Mobility Referral Yes  Mobility visit 1 Mobility  Mobility Specialist Start Time (ACUTE ONLY) 1345  Mobility Specialist Stop Time (ACUTE ONLY) 1400  Mobility Specialist Time Calculation (min) (ACUTE ONLY) 15 min   Pt was found in bed and agreeable to ambulate. Grew fatigued with session. Had x1 seated rest break. Returned to bed with all needs met. Call bell in reach and RRT in room.  Lorna Rose Mobility Specialist

## 2023-08-26 NOTE — Progress Notes (Signed)
 PROGRESS NOTE    [name]  JYN:[id number] DOB: 1934-04-05 DOA: 08/24/2023 PCP: Austine Lefort, MD    Brief Narrative:   89 year old with history of [name], CVA, CKD, [name], CHF with preserved EF, A-fib not on anticoagulation due to frequent falls, dementia comes to the ED with right-sided flank pain. Although on CT scan there is no obvious evidence of pyelonephritis, UA suggestive of urinary tract infection.  Cultures again this admission are growing E. coli.  Currently on empiric Rocephin   Assessment & Plan:  Principal Problem:   Urinary tract infection     Right-sided flank pain likely from urinary tract infection -Although on CT scan there is no obvious evidence of pyelonephritis, UA suggestive of urinary tract infection.  Cultures again this admission are growing E. coli.  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  Overall patient is a high risk and a poor surgical candidate.   Abnormal breath sounds -She is having bilateral expiratory wheezing.  I suspect this could be secondary to fluid.  Will check BNP.  If elevated we could give Lasix .  Bronchodilators prescribed.  Out of bed to chair.  CKD stage IIIa -Creatinine remains around baseline of 1.0   Chronic atrial fibrillation -Can continue home medication.  Not on anticoagulation due to multiple falls   History of CVA Hyperlipidemia - Continue statin, aspirin , Zetia    Chronic knee pain secondary to osteoarthritis Chronic low back pain -Pain control.  Gabapentin    GERD - PPI  PT/OT = HH, f35f completed.   DVT prophylaxis: Lovenox  DNR/DNI Family Communication:  Called Liza Continue hospital stay for at least 24 hours  Subjective:  Still weak.   Examination:  General exam: Appears calm and comfortable  Respiratory system: Diffuse bilateral  expiratory wheezing Cardiovascular system: S1 & S2 heard, RRR. No JVD, murmurs, rubs, gallops or clicks. No pedal edema. Gastrointestinal system: Abdomen is nondistended, soft and nontender. No organomegaly or masses felt. Normal bowel sounds heard. Central nervous system: Alert and oriented. No focal neurological deficits. Extremities: Symmetric 5 x 5 power. Skin: No rashes, lesions or ulcers Psychiatry: Judgement and insight appear normal. Mood & affect appropriate.                Diet Orders (From admission, onward)     Start     Ordered   08/24/23 1115  Diet Heart Room service appropriate? Yes; Fluid consistency: Thin  Diet effective now       Question Answer Comment  Room service appropriate? Yes   Fluid consistency: Thin      08/24/23 1114            Objective: Vitals:   08/25/23 1616 08/25/23 2125 08/25/23 2247 08/26/23 0652  BP:  117/74  105/80  Pulse: 86 96 97 89  Resp: 20 17 18 20   Temp:  98.6 F (37 C)  98.3 F (36.8 C)  TempSrc:  Oral    SpO2: 96% 96% 96% 100%  Weight:      Height:        Intake/Output Summary (Last 24 hours) at 08/26/2023 1043 Last data filed at 08/25/2023 1800 Gross per 24 hour  Intake 940 ml  Output --  Net 940 ml   Filed Weights   08/24/23 0347 08/24/23 1036  Weight: 75 kg 77.5 kg    Scheduled Meds:  aspirin  EC  81 mg Oral Daily  atenolol   12.5 mg Oral Daily   DULoxetine   90 mg Oral Daily   enoxaparin  (LOVENOX ) injection  40 mg Subcutaneous Q24H   ezetimibe   10 mg Oral Daily   gabapentin   800 mg Oral TID   ipratropium-albuterol   3 mL Nebulization TID   pantoprazole   40 mg Oral Daily   predniSONE   40 mg Oral Q breakfast   rosuvastatin   40 mg Oral QPM   Continuous Infusions:  cefTRIAXone  (ROCEPHIN )  IV 1 g (08/26/23 0425)    Nutritional status     Body mass index is 34.51 kg/m.  Data Reviewed:   CBC: Recent Labs  Lab 08/24/23 0411 08/25/23 0355 08/25/23 0945  WBC 8.7 5.7 5.9  NEUTROABS 4.5  --    --   HGB 13.2 11.5* 11.3*  HCT 43.1 38.7 39.2  MCV 93.9 98.5 101.3*  PLT 201 146* 148*   Basic Metabolic Panel: Recent Labs  Lab 08/24/23 0411 08/25/23 0355 08/26/23 0727  NA 140 137 139  K 3.9 4.3 4.8  CL 101 101 98  CO2 32 26 31  GLUCOSE 149* 113* 109*  BUN 17 19 18   CREATININE 0.93 0.91 1.03*  CALCIUM  9.3 8.3* 8.6*  MG  --   --  1.7   GFR: Estimated Creatinine Clearance: 34.6 mL/min (A) (by C-G formula based on SCr of 1.03 mg/dL (H)). Liver Function Tests: Recent Labs  Lab 08/24/23 0411  AST 28  ALT 15  ALKPHOS 64  BILITOT 0.6  PROT 7.1  ALBUMIN  4.1   No results for input(s): "LIPASE", "AMYLASE" in the last 168 hours. No results for input(s): "AMMONIA" in the last 168 hours. Coagulation Profile: No results for input(s): "INR", "PROTIME" in the last 168 hours. Cardiac Enzymes: No results for input(s): "CKTOTAL", "CKMB", "CKMBINDEX", "TROPONINI" in the last 168 hours. BNP (last 3 results) No results for input(s): "PROBNP" in the last 8760 hours. HbA1C: No results for input(s): "HGBA1C" in the last 72 hours. CBG: No results for input(s): "GLUCAP" in the last 168 hours. Lipid Profile: No results for input(s): "CHOL", "HDL", "LDLCALC", "TRIG", "CHOLHDL", "LDLDIRECT" in the last 72 hours. Thyroid  Function Tests: No results for input(s): "TSH", "T4TOTAL", "FREET4", "T3FREE", "THYROIDAB" in the last 72 hours. Anemia Panel: No results for input(s): "VITAMINB12", "FOLATE", "FERRITIN", "TIBC", "IRON", "RETICCTPCT" in the last 72 hours. Sepsis Labs: No results for input(s): "PROCALCITON", "LATICACIDVEN" in the last 168 hours.  Recent Results (from the past 240 hours)  Urine Culture     Status: Abnormal   Collection Time: 08/24/23  4:14 AM   Specimen: Urine, Clean Catch  Result Value Ref Range Status   Specimen Description   Final    URINE, CLEAN CATCH Performed at [facility], [address], [place], [place] [address]    Special Requests    Final    Normal Performed at [facility], [address], [place], [place] [address]    Culture >=100,000 COLONIES/mL ESCHERICHIA COLI (A)  Final   Report Status 08/26/2023 FINAL  Final   Organism ID, Bacteria ESCHERICHIA COLI (A)  Final      Susceptibility   Escherichia coli - MIC*    AMPICILLIN >=32 RESISTANT Resistant     CEFAZOLIN  8 SENSITIVE Sensitive     CEFEPIME  <=0.12 SENSITIVE Sensitive     CEFTRIAXONE  <=0.25 SENSITIVE Sensitive     CIPROFLOXACIN  <=0.25 SENSITIVE Sensitive     GENTAMICIN  <=1 SENSITIVE Sensitive     IMIPENEM <=0.25 SENSITIVE Sensitive  NITROFURANTOIN  <=16 SENSITIVE Sensitive     TRIMETH/SULFA <=20 SENSITIVE Sensitive     AMPICILLIN/SULBACTAM >=32 RESISTANT Resistant     PIP/TAZO 64 INTERMEDIATE Intermediate ug/mL    * >=100,000 COLONIES/mL ESCHERICHIA COLI  Blood culture (routine x 2)     Status: None (Preliminary result)   Collection Time: 08/24/23  5:00 AM   Specimen: BLOOD LEFT ARM  Result Value Ref Range Status   Specimen Description   Final    BLOOD LEFT ARM Performed at [facility], [address] [place], [place] [address]    Special Requests   Final    BOTTLES DRAWN AEROBIC AND ANAEROBIC Blood Culture results may not be optimal due to an inadequate volume of blood received in culture bottles Performed at [facility], [address], [place], [place] [address]    Culture   Final    NO GROWTH 2 DAYS Performed at [facility], [address] [place], [place] [address]    Report Status PENDING  Incomplete  Blood culture (routine x 2)     Status: None (Preliminary result)   Collection Time: 08/24/23  5:20 AM   Specimen: BLOOD RIGHT ARM  Result Value Ref Range Status   Specimen Description   Final    BLOOD RIGHT ARM Performed at [facility], [address] [place], [place] [address]    Special Requests   Final    BOTTLES DRAWN AEROBIC AND ANAEROBIC  Blood Culture results may not be optimal due to an inadequate volume of blood received in culture bottles Performed at [facility], [address], [place], [place] [address]    Culture   Final    NO GROWTH 2 DAYS Performed at [facility], [address], [place], [place] [address]    Report Status PENDING  Incomplete         Radiology Studies: No results found.         LOS: 0 days   Time spent= 35 mins    Maggie Schooner, MD [facility]  If 7PM-7AM, please contact night-coverage  08/26/2023, 10:43 AM

## 2023-08-27 DIAGNOSIS — N390 Urinary tract infection, site not specified: Secondary | ICD-10-CM | POA: Diagnosis not present

## 2023-08-27 DIAGNOSIS — N3000 Acute cystitis without hematuria: Secondary | ICD-10-CM | POA: Diagnosis not present

## 2023-08-27 LAB — BASIC METABOLIC PANEL WITH GFR
Anion gap: 10 (ref 5–15)
BUN: 18 mg/dL (ref 8–23)
CO2: 30 mmol/L (ref 22–32)
Calcium: 8.7 mg/dL — ABNORMAL LOW (ref 8.9–10.3)
Chloride: 99 mmol/L (ref 98–111)
Creatinine, Ser: 0.88 mg/dL (ref 0.44–1.00)
GFR, Estimated: >60 mL/min (ref >60)
Glucose, Bld: 150 mg/dL — ABNORMAL HIGH (ref 70–99)
Potassium: 4 mmol/L (ref 3.5–5.1)
Sodium: 139 mmol/L (ref 135–145)

## 2023-08-27 LAB — MAGNESIUM: Magnesium: 2 mg/dL (ref 1.7–2.4)

## 2023-08-27 MED ORDER — PREDNISONE 20 MG PO TABS: 40.0000 mg | ORAL_TABLET | Freq: Every day | ORAL | 0 refills | Status: AC

## 2023-08-27 MED ORDER — CEPHALEXIN 500 MG PO CAPS: 500.0000 mg | ORAL_CAPSULE | Freq: Three times a day (TID) | ORAL | 0 refills | Status: AC

## 2023-08-27 MED ORDER — ALBUTEROL SULFATE HFA 108 (90 BASE) MCG/ACT IN AERS: 2.0000 | INHALATION_SPRAY | RESPIRATORY_TRACT | 2 refills | Status: AC | PRN

## 2023-08-27 NOTE — Progress Notes (Signed)
 Patient's son arrived to take patient home; discharge instructions explained to son and patient; all questions answered; no complications noted

## 2023-08-27 NOTE — Plan of Care (Signed)

## 2023-08-27 NOTE — Progress Notes (Signed)
 PHARMACY - PHYSICIAN COMMUNICATION CRITICAL VALUE ALERT - BLOOD CULTURE IDENTIFICATION (BCID)  [name] is an 89 y.o. female who presented to [facility] on 08/24/2023 with a chief complaint of right flank pain.   Assessment:  Admit with UTI.  Urine cx + Ecoli. 1/4 blood cx + now growing GPR- likely contaminant. .  Name of physician (or Provider) ContactedAriel Begun  Current antibiotics: Rocephin  1gm IV q24h  Changes to prescribed antibiotics recommended:  Patient is on recommended antibiotics - No changes needed  No results found for this or any previous visit.  Arie Kurtz PharmD 08/27/2023  7:01 AM

## 2023-08-27 NOTE — Discharge Summary (Signed)
 Physician Discharge Summary  Reighan Dorsey WJX:[id number] DOB: 1933/12/30 DOA: 08/24/2023  PCP: Austine Lefort, MD  Admit date: 08/24/2023 Discharge date: 08/27/2023  Admitted From: Home Disposition: Home  Recommendations for Outpatient Follow-up:  Follow up with PCP in 1-2 weeks Please obtain BMP/CBC in one week your next doctors visit.  P.o. Keflex  prescribed to complete course for UTI.   Discharge Condition: Stable CODE STATUS: DNR Diet recommendation: Heart healthy  Brief/Interim Summary: Brief Narrative:   89 year old with history of [name], CVA, CKD, [name], CHF with preserved EF, A-fib not on anticoagulation due to frequent falls, dementia comes to the ED with right-sided flank pain. Although on CT scan there is no obvious evidence of pyelonephritis, UA suggestive of urinary tract infection.  Cultures again this admission are growing E. coli.  Currently on empiric Rocephin  which was eventually changed to p.o. Keflex  after reviewing the culture data.  PT/OT recommended home health services which were arranged.  Due to mild wheezing but without evidence of hypoxia she was started on some steroids and advised to use her bronchodilators at home. Today medically stable for discharge.  I have spoken with patient's son Bambi Lever on the day of discharge and updated him.  Assessment & Plan:  Principal Problem:   Urinary tract infection     Right-sided flank pain likely from urinary tract infection -Although on CT scan there is no obvious evidence of pyelonephritis, UA suggestive of urinary tract infection.  Cultures again this admission are growing E. coli.  Will transition to p.o. 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  Overall patient is a high risk and a poor surgical candidate.   Abnormal breath sounds Wheezing is significantly improved on  steroids.  Bronchodilators, out of bed to chair.  BNP is relatively normal.  Short course of steroids.  CKD stage IIIa -Creatinine remains around baseline of 1.0   Chronic atrial fibrillation -Can continue home medication.  Not on anticoagulation due to multiple falls   History of CVA Hyperlipidemia - Continue statin, aspirin , Zetia    Chronic knee pain secondary to osteoarthritis Chronic low back pain -Pain control.  Gabapentin    GERD - PPI  PT/OT = HH, f60f completed.   DVT prophylaxis: Lovenox  DNR/DNI Family Communication: Updated Michael Continue hospital stay for at least 24 hours  Subjective:  No complaints feeling okay  Examination:  General exam: Appears calm and comfortable  Respiratory system: Diffuse bilateral expiratory wheezing Cardiovascular system: S1 & S2 heard, RRR. No JVD, murmurs, rubs, gallops or clicks. No pedal edema. Gastrointestinal system: Abdomen is nondistended, soft and nontender. No organomegaly or masses felt. Normal bowel sounds heard. Central nervous system: Alert and oriented. No focal neurological deficits. Extremities: Symmetric 5 x 5 power. Skin: No rashes, lesions or ulcers Psychiatry: Judgement and insight appear normal. Mood & affect appropriate.    Discharge Diagnoses:  Principal Problem:   Urinary tract infection      Discharge Exam: Vitals:   08/27/23 0355 08/27/23 1002  BP: 104/68   Pulse: 87   Resp: 17   Temp: 97.7 F (36.5 C)   SpO2: 97% 96%   Vitals:   08/26/23 1940 08/26/23 2041 08/27/23 0355 08/27/23 1002  BP: (!) 124/93  104/68   Pulse: 87  87   Resp: 16  17   Temp: 98.1 F (36.7 C)  97.7 F (36.5 C)   TempSrc: Oral  Axillary  SpO2: 97% 96% 97% 96%  Weight:      Height:          Discharge Instructions   Allergies as of 08/27/2023       Reactions   Sulfonamide Derivatives Hives, Rash        Medication List     TAKE these medications    albuterol  108 (90 Base) MCG/ACT  inhaler Commonly known as: VENTOLIN  HFA Inhale 2 puffs into the lungs every 4 (four) hours as needed for wheezing or shortness of breath.   aspirin  EC 81 MG tablet Take 1 tablet (81 mg total) by mouth daily.   atenolol  25 MG tablet Commonly known as: TENORMIN  TAKE ONE-HALF TABLET BY MOUTH DAILY   Calcium  Carbonate-Vitamin D3 600-400 MG-UNIT Tabs Take 1 tablet by mouth daily.   cephALEXin  500 MG capsule Commonly known as: KEFLEX  Take 1 capsule (500 mg total) by mouth 3 (three) times daily for 4 days.   clotrimazole -betamethasone  cream Commonly known as: LOTRISONE  Apply 1 Application topically daily as needed (fungal infection).   CRANBERRY PO Take 1 capsule by mouth daily at 12 noon.   DULoxetine  30 MG capsule Commonly known as: CYMBALTA  TAKE 1 CAPSULE BY MOUTH DAILY What changed: additional instructions   DULoxetine  60 MG capsule Commonly known as: CYMBALTA  TAKE 1 CAPSULE BY MOUTH DAILY IN ADDITION TO CYMBALTA  30 MG TO EQUAL A TOTAL DOSE OF 90 MG PER DAY What changed: See the new instructions.   ezetimibe  10 MG tablet Commonly known as: ZETIA  TAKE 1 TABLET (10 MG TOTAL) BY MOUTH DAILY.   gabapentin  800 MG tablet Commonly known as: NEURONTIN  TAKE 1 TABLET BY MOUTH 3 TIMES DAILY   ketoconazole  2 % cream Commonly known as: NIZORAL  Apply 1 Application topically 2 (two) times daily. What changed:  when to take this reasons to take this   meloxicam  7.5 MG tablet Commonly known as: MOBIC  Take 1 tablet (7.5 mg total) by mouth daily. What changed:  when to take this reasons to take this   pantoprazole  40 MG tablet Commonly known as: PROTONIX  TAKE 1 TABLET BY MOUTH ONCE DAILY   predniSONE  20 MG tablet Commonly known as: DELTASONE  Take 2 tablets (40 mg total) by mouth daily with breakfast for 3 days. Start taking on: August 28, 2023   rosuvastatin  40 MG tablet Commonly known as: CRESTOR  TAKE 1 TABLET BY MOUTH IN THE EVENING        Allergies  Allergen  Reactions   Sulfonamide Derivatives Hives and Rash    You were cared for by a hospitalist during your hospital stay. If you have any questions about your discharge medications or the care you received while you were in the hospital after you are discharged, you can call the unit and asked to speak with the hospitalist on call if the hospitalist that took care of you is not available. Once you are discharged, your primary care physician will handle any further medical issues. Please note that no refills for any discharge medications will be authorized once you are discharged, as it is imperative that you return to your primary care physician (or establish a relationship with a primary care physician if you do not have one) for your aftercare needs so that they can reassess your need for medications and monitor your lab values.  You were cared for by a hospitalist during your hospital stay. If you have any questions about your discharge medications or the care you received while you were in the  hospital after you are discharged, you can call the unit and asked to speak with the hospitalist on call if the hospitalist that took care of you is not available. Once you are discharged, your primary care physician will handle any further medical issues. Please note that NO REFILLS for any discharge medications will be authorized once you are discharged, as it is imperative that you return to your primary care physician (or establish a relationship with a primary care physician if you do not have one) for your aftercare needs so that they can reassess your need for medications and monitor your lab values.  Please request your Prim.MD to go over all Hospital Tests and Procedure/Radiological results at the follow up, please get all Hospital records sent to your Prim MD by signing hospital release before you go home.  Get CBC, CMP, 2 view Chest X ray checked  by Primary MD during your next visit or SNF MD in 5-7 days (  we routinely change or add medications that can affect your baseline labs and fluid status, therefore we recommend that you get the mentioned basic workup next visit with your PCP, your PCP may decide not to get them or add new tests based on their clinical decision)  On your next visit with your primary care physician please Get Medicines reviewed and adjusted.  If you experience worsening of your admission symptoms, develop shortness of breath, life threatening emergency, suicidal or homicidal thoughts you must seek medical attention immediately by calling 911 or calling your MD immediately  if symptoms less severe.  You Must read complete instructions/literature along with all the possible adverse reactions/side effects for all the Medicines you take and that have been prescribed to you. Take any new Medicines after you have completely understood and accpet all the possible adverse reactions/side effects.   Do not drive, operate heavy machinery, perform activities at heights, swimming or participation in water activities or provide baby sitting services if your were admitted for syncope or siezures until you have seen by Primary MD or a Neurologist and advised to do so again.  Do not drive when taking Pain medications.   Procedures/Studies: CT Angio Chest/Abd/Pel for Dissection W and/or Wo Contrast Result Date: 08/24/2023 CLINICAL DATA:  Evaluate for acute aortic syndrome. Complains of right flank pain. Worse this morning. Pain is exacerbated by taking a deep breath. EXAM: CT ANGIOGRAPHY CHEST, ABDOMEN AND PELVIS TECHNIQUE: Non-contrast CT of the chest was initially obtained. Multidetector CT imaging through the chest, abdomen and pelvis was performed using the standard protocol during bolus administration of intravenous contrast. Multiplanar reconstructed images and MIPs were obtained and reviewed to evaluate the vascular anatomy. RADIATION DOSE REDUCTION: This exam was performed according to the  departmental dose-optimization program which includes automated exposure control, adjustment of the mA and/or kV according to patient size and/or use of iterative reconstruction technique. CONTRAST:  OMNIPAQUE  IOHEXOL  350 MG/ML SOLN COMPARISON:  07/08/2023 FINDINGS: CTA CHEST FINDINGS Cardiovascular: Preferential opacification of the thoracic aorta. No evidence of thoracic aortic aneurysm or dissection. Extensive atherosclerotic calcifications and aortic tortuosity identified. Coronary artery calcifications. No pericardial effusion. Main pulmonary artery and its proximal branches appear patent without signs of incidental pulmonary embolus. Mediastinum/Nodes: No enlarged mediastinal, hilar, or axillary lymph nodes. Thyroid  gland, trachea, and esophagus demonstrate no significant findings. Lungs/Pleura: Some retained secretions are noted along the left lateral wall of the distal trachea. Central airways appear patent. No pleural effusion, airspace consolidation or pneumothorax. Diffuse bronchial wall thickening noted.  Mild patchy ground-glass density noted within the anterior right upper lobe. Musculoskeletal: Osteopenia. Vertebral plana deformity is noted at the T12 level which is unchanged from the previous exam. No acute or suspicious osseous findings. Review of the MIP images confirms the above findings. CTA ABDOMEN AND PELVIS FINDINGS VASCULAR Aorta: Extensive aortic atherosclerosis. Abdominal aortic aneurysm is again identified. Supra renal abdominal aorta measures 3.4 cm, image 59/8. Infrarenal abdominal aortic aneurysm measures 5.8 cm, image 69/8.1 no signs of dissection. No periaortic fluid or soft tissue edema. 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 No signs of dissection. 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 Renals: Moderate proximal stenosis of bilateral renal arteries. No dissection or aneurysm. Patent. Multifocal mild  stenosis. IMA: Patent.  Multifocal mild-to-moderate stenosis as before. Inflow: No significant stenosis. Veins: No obvious venous abnormality within the limitations of this arterial phase study. Review of the MIP images confirms the above findings. NON-VASCULAR Hepatobiliary: Mild lobular contour of the liver. No focal liver lesion identified. Cholecystectomy. Common bile duct measures up to 1 cm. No calcified common bile duct stones identified. Pancreas: Unremarkable. No pancreatic ductal dilatation or surrounding inflammatory changes. Spleen: Normal in size without focal abnormality. Adrenals/Urinary Tract: Normal adrenal glands. Thin, curvilinear calcifications within the lower pole of the right kidney may be vascular nature, image 43/8. No signs of obstructive uropathy. Inferior pole right kidney cyst compatible with a Bosniak class 1 lesion. This measures 2.2 cm. No follow-up imaging recommended. No focal bladder abnormality. Stomach/Bowel: Stomach appears normal. The appendix is not visualized. No secondary signs of acute appendicitis. No bowel wall thickening, inflammation, or distension. No signs of pneumatosis. Left-sided colonic diverticulosis. No signs of acute diverticulitis. Lymphatic: No signs of abdominopelvic adenopathy. Reproductive: Status post hysterectomy. No adnexal masses. Other: No free fluid or fluid collections. No signs of pneumoperitoneum. Musculoskeletal: Status post left hip arthroplasty. Remote healed left inferior pubic rami fracture. Chronic moderate L5 compression deformity. Marked thoracolumbar spondylosis. Leftward curvature of the lumbar spine. Review of the MIP images confirms the above findings. IMPRESSION: 1. No signs of thoracic aortic aneurysm or dissection. 2. Abdominal aortic aneurysm measuring up to 5.8 cm infrarenal. Recommend follow-up every 6 months and vascular consultation. 3. 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 4.  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 5. Moderate proximal stenosis of bilateral renal arteries. 6. Left-sided colonic diverticulosis. No signs of acute diverticulitis. 7. Chronic vertebral plana deformity at the T12 level. Chronic moderate L5 compression deformity. 8.  Aortic Atherosclerosis (ICD10-I70.0). Electronically Signed   By: Kimberley Penman M.D.   On: 08/24/2023 05:30     The results of significant diagnostics from this hospitalization (including imaging, microbiology, ancillary and laboratory) are listed below for reference.     Microbiology: Recent Results (from the past 240 hours)  Urine Culture     Status: Abnormal   Collection Time: 08/24/23  4:14 AM   Specimen: Urine, Clean Catch  Result Value Ref Range Status   Specimen Description   Final    URINE, CLEAN CATCH Performed at [facility], [address], [place], [place] [address]    Special Requests   Final    Normal Performed at [facility], [address] [place], [place] [address]    Culture >=100,000 COLONIES/mL ESCHERICHIA COLI (A)  Final   Report Status 08/26/2023 FINAL  Final   Organism ID, Bacteria ESCHERICHIA COLI (  A)  Final      Susceptibility   Escherichia coli - MIC*    AMPICILLIN >=32 RESISTANT Resistant     CEFAZOLIN  8 SENSITIVE Sensitive     CEFEPIME  <=0.12 SENSITIVE Sensitive     CEFTRIAXONE  <=0.25 SENSITIVE Sensitive     CIPROFLOXACIN  <=0.25 SENSITIVE Sensitive     GENTAMICIN  <=1 SENSITIVE Sensitive     IMIPENEM <=0.25 SENSITIVE Sensitive     NITROFURANTOIN  <=16 SENSITIVE Sensitive     TRIMETH/SULFA <=20 SENSITIVE Sensitive     AMPICILLIN/SULBACTAM >=32 RESISTANT Resistant     PIP/TAZO 64 INTERMEDIATE Intermediate ug/mL    * >=100,000 COLONIES/mL ESCHERICHIA COLI  Blood culture (routine x 2)     Status: None (Preliminary result)   Collection Time: 08/24/23  5:00 AM   Specimen: BLOOD LEFT ARM  Result Value Ref Range Status    Specimen Description   Final    BLOOD LEFT ARM Performed at [facility], [address], [place], [place] [address]    Special Requests   Final    BOTTLES DRAWN AEROBIC AND ANAEROBIC Blood Culture results may not be optimal due to an inadequate volume of blood received in culture bottles Performed at [facility], [address], [place], [place] [address]    Culture  Setup Time   Final    GRAM POSITIVE RODS AEROBIC BOTTLE ONLY CRITICAL RESULT CALLED TO, READ BACK BY AND VERIFIED WITH: L POINDEXTER,PHARMD@0700  08/27/23 MK Performed at [facility], [address] [place], [place] [address]    Culture GRAM POSITIVE RODS  Final   Report Status PENDING  Incomplete  Blood culture (routine x 2)     Status: None (Preliminary result)   Collection Time: 08/24/23  5:20 AM   Specimen: BLOOD RIGHT ARM  Result Value Ref Range Status   Specimen Description   Final    BLOOD RIGHT ARM Performed at [facility], [address], [place], [place] [address]    Special Requests   Final    BOTTLES DRAWN AEROBIC AND ANAEROBIC Blood Culture results may not be optimal due to an inadequate volume of blood received in culture bottles Performed at [facility], [address] [place], [place] [address]    Culture   Final    NO GROWTH 3 DAYS Performed at [facility], [address] [place], [place] [address]    Report Status PENDING  Incomplete     Labs: BNP (last 3 results) Recent Labs    04/01/23 0847 07/08/23 0800 08/25/23 0957  BNP 154.5* 83.0 69.4   Basic Metabolic Panel: Recent Labs  Lab 08/24/23 0411 08/25/23 0355 08/26/23 0727 08/27/23 0448  NA 140 137 139 139  K 3.9 4.3 4.8 4.0  CL 101 101 98 99  CO2 32 26 31 30   GLUCOSE 149* 113* 109* 150*  BUN 17 19 18 18   CREATININE 0.93 0.91 1.03* 0.88  CALCIUM  9.3 8.3* 8.6* 8.7*  MG  --   --  1.7 2.0   Liver Function Tests: Recent Labs   Lab 08/24/23 0411  AST 28  ALT 15  ALKPHOS 64  BILITOT 0.6  PROT 7.1  ALBUMIN  4.1   No results for input(s): "LIPASE", "AMYLASE" in the last 168 hours. No results for input(s): "AMMONIA" in the last 168 hours. CBC: Recent Labs  Lab 08/24/23 0411 08/25/23 0355 08/25/23 0945  WBC 8.7 5.7 5.9  NEUTROABS 4.5  --   --   HGB 13.2  11.5* 11.3*  HCT 43.1 38.7 39.2  MCV 93.9 98.5 101.3*  PLT 201 146* 148*   Cardiac Enzymes: No results for input(s): "CKTOTAL", "CKMB", "CKMBINDEX", "TROPONINI" in the last 168 hours. BNP: Invalid input(s): "POCBNP" CBG: No results for input(s): "GLUCAP" in the last 168 hours. D-Dimer No results for input(s): "DDIMER" in the last 72 hours. Hgb A1c No results for input(s): "HGBA1C" in the last 72 hours. Lipid Profile No results for input(s): "CHOL", "HDL", "LDLCALC", "TRIG", "CHOLHDL", "LDLDIRECT" in the last 72 hours. Thyroid  function studies No results for input(s): "TSH", "T4TOTAL", "T3FREE", "THYROIDAB" in the last 72 hours.  Invalid input(s): "FREET3" Anemia work up No results for input(s): "VITAMINB12", "FOLATE", "FERRITIN", "TIBC", "IRON", "RETICCTPCT" in the last 72 hours. Urinalysis    Component Value Date/Time   COLORURINE YELLOW 08/24/2023 0352   APPEARANCEUR CLEAR 08/24/2023 0352   LABSPEC 1.026 08/24/2023 0352   PHURINE 5.5 08/24/2023 0352   GLUCOSEU NEGATIVE 08/24/2023 0352   GLUCOSEU NEG mg/dL 16/01/9603 5409   HGBUR NEGATIVE 08/24/2023 0352   HGBUR negative 03/06/2008 0846   BILIRUBINUR NEGATIVE 08/24/2023 0352   KETONESUR NEGATIVE 08/24/2023 0352   PROTEINUR TRACE (A) 08/24/2023 0352   UROBILINOGEN 0.2 09/20/2013 1535   NITRITE POSITIVE (A) 08/24/2023 0352   LEUKOCYTESUR SMALL (A) 08/24/2023 0352   Sepsis Labs Recent Labs  Lab 08/24/23 0411 08/25/23 0355 08/25/23 0945  WBC 8.7 5.7 5.9   Microbiology Recent Results (from the past 240 hours)  Urine Culture     Status: Abnormal   Collection Time: 08/24/23  4:14  AM   Specimen: Urine, Clean Catch  Result Value Ref Range Status   Specimen Description   Final    URINE, CLEAN CATCH Performed at [facility], [address] [place], [place] [address]    Special Requests   Final    Normal Performed at [facility], [address] [place], [place] [address]    Culture >=100,000 COLONIES/mL ESCHERICHIA COLI (A)  Final   Report Status 08/26/2023 FINAL  Final   Organism ID, Bacteria ESCHERICHIA COLI (A)  Final      Susceptibility   Escherichia coli - MIC*    AMPICILLIN >=32 RESISTANT Resistant     CEFAZOLIN  8 SENSITIVE Sensitive     CEFEPIME  <=0.12 SENSITIVE Sensitive     CEFTRIAXONE  <=0.25 SENSITIVE Sensitive     CIPROFLOXACIN  <=0.25 SENSITIVE Sensitive     GENTAMICIN  <=1 SENSITIVE Sensitive     IMIPENEM <=0.25 SENSITIVE Sensitive     NITROFURANTOIN  <=16 SENSITIVE Sensitive     TRIMETH/SULFA <=20 SENSITIVE Sensitive     AMPICILLIN/SULBACTAM >=32 RESISTANT Resistant     PIP/TAZO 64 INTERMEDIATE Intermediate ug/mL    * >=100,000 COLONIES/mL ESCHERICHIA COLI  Blood culture (routine x 2)     Status: None (Preliminary result)   Collection Time: 08/24/23  5:00 AM   Specimen: BLOOD LEFT ARM  Result Value Ref Range Status   Specimen Description   Final    BLOOD LEFT ARM Performed at [facility], [address], [place], [place] [address]    Special Requests   Final    BOTTLES DRAWN AEROBIC AND ANAEROBIC Blood Culture results may not be optimal due to an inadequate volume of blood received in culture bottles Performed at [facility], [address] [place], [place] [address]    Culture  Setup Time   Final    GRAM POSITIVE RODS AEROBIC BOTTLE ONLY CRITICAL RESULT CALLED TO, READ BACK BY AND VERIFIED  WITH: L POINDEXTER,PHARMD@0700  08/27/23 MK Performed at [facility], [address] [place], [place] [address]    Culture GRAM POSITIVE RODS  Final   Report  Status PENDING  Incomplete  Blood culture (routine x 2)     Status: None (Preliminary result)   Collection Time: 08/24/23  5:20 AM   Specimen: BLOOD RIGHT ARM  Result Value Ref Range Status   Specimen Description   Final    BLOOD RIGHT ARM Performed at [facility], [address], [place], [place] [address]    Special Requests   Final    BOTTLES DRAWN AEROBIC AND ANAEROBIC Blood Culture results may not be optimal due to an inadequate volume of blood received in culture bottles Performed at [facility], [address], [place], [place] [address]    Culture   Final    NO GROWTH 3 DAYS Performed at [facility], [address] [place], [place] [address]    Report Status PENDING  Incomplete     Time coordinating discharge:  I have spent 35 minutes face to face with the patient and on the ward discussing the patients care, assessment, plan and disposition with other care givers. >50% of the time was devoted counseling the patient about the risks and benefits of treatment/Discharge disposition and coordinating care.   SIGNED:   Maggie Schooner, MD  [facility] 08/27/2023, 11:43 AM   If 7PM-7AM, please contact night-coverage

## 2023-08-29 ENCOUNTER — Telehealth: Payer: Self-pay

## 2023-08-29 LAB — CULTURE, BLOOD (ROUTINE X 2)
Culture  Setup Time: NO GROWTH
Culture: NO GROWTH

## 2023-08-29 NOTE — Telephone Encounter (Signed)
 FYI

## 2023-08-29 NOTE — Telephone Encounter (Signed)
 Copied from CRM [id number]. Topic: General - Other >> Aug 29, 2023 12:37 PM Emylou G wrote: Reason for CRM: Son Donavon Fudge called.Aaron Aas adv mom is doing better.. including eating

## 2023-09-05 ENCOUNTER — Inpatient Hospital Stay: Admitting: Family Medicine

## 2023-09-12 ENCOUNTER — Encounter: Payer: Self-pay | Admitting: Family Medicine

## 2023-09-12 ENCOUNTER — Ambulatory Visit (INDEPENDENT_AMBULATORY_CARE_PROVIDER_SITE_OTHER): Admitting: Family Medicine

## 2023-09-12 VITALS — BP 120/72 | HR 90 | Temp 98.2°F | Ht 59.0 in | Wt 169.6 lb

## 2023-09-12 DIAGNOSIS — N390 Urinary tract infection, site not specified: Secondary | ICD-10-CM | POA: Diagnosis not present

## 2023-09-12 LAB — CBC WITH DIFFERENTIAL/PLATELET
Absolute Lymphocytes: 2044 {cells}/uL (ref 850–3900)
Absolute Monocytes: 469 {cells}/uL (ref 200–950)
Basophils Absolute: 67 {cells}/uL (ref 0–200)
Basophils Relative: 1 %
Eosinophils Absolute: 188 {cells}/uL (ref 15–500)
Eosinophils Relative: 2.8 %
HCT: 40.8 % (ref 35.0–45.0)
Hemoglobin: 12.5 g/dL (ref 11.7–15.5)
MCH: 28.7 pg (ref 27.0–33.0)
MCHC: 30.6 g/dL — ABNORMAL LOW (ref 32.0–36.0)
MCV: 93.8 fL (ref 80.0–100.0)
MPV: 10.4 fL (ref 7.5–12.5)
Monocytes Relative: 7 %
Neutro Abs: 3933 {cells}/uL (ref 1500–7800)
Neutrophils Relative %: 58.7 %
Platelets: 138 10*3/uL — ABNORMAL LOW (ref 140–400)
RBC: 4.35 10*6/uL (ref 3.80–5.10)
RDW: 13.8 % (ref 11.0–15.0)
Total Lymphocyte: 30.5 %
WBC: 6.7 10*3/uL (ref 3.8–10.8)

## 2023-09-12 LAB — BASIC METABOLIC PANEL WITHOUT GFR
BUN: 20 mg/dL (ref 7–25)
CO2: 30 mmol/L (ref 20–32)
Calcium: 9.1 mg/dL (ref 8.6–10.4)
Chloride: 100 mmol/L (ref 98–110)
Creat: 0.83 mg/dL (ref 0.60–0.95)
Glucose, Bld: 112 mg/dL — ABNORMAL HIGH (ref 65–99)
Potassium: 4.4 mmol/L (ref 3.5–5.3)
Sodium: 139 mmol/L (ref 135–146)

## 2023-09-12 NOTE — Progress Notes (Signed)
 Subjective:    Patient ID: [name], female    DOB: 1933/08/05, 89 y.o.   MRN: [id number]  Mouth Lesions  Associated symptoms include mouth sores.   Patient has a history of a remote stroke, hypertension, hyperlipidemia, and prediabetes.  She has had frequent urinary tract infections over the last 6 to 12 months.  However usually the patient is able to verbalize dysuria to her son and be treated in time.  Recently she was hospitalized with pyelonephritis.  She had no symptoms other than flank pain.  She was treated in the hospital initially with Rocephin  and then was transitioned to oral Keflex .  She is here today for follow-up.  She denies any chest pain or shortness of breath.  She denies any fever or chills.  She denies any dysuria.  Denies any nausea or vomiting. Past Medical History:  Diagnosis Date   [name] (abdominal aortic aneurysm) (HCC) 09/2013   3.6 cm   Acute and chronic respiratory failure with hypoxia (HCC)    Acute delirium    Acute encephalopathy    Acute pyelonephritis    Atrial fibrillation (HCC)    Chronic pain syndrome    Constipation    Dementia (HCC)    Dependent edema    bilateral legs    Depression    Diabetes mellitus    pre-diabetes   Diverticulosis    Diverticulosis    Dysrhythmia    Dysuria    E coli bacteremia    Fall    Gross hematuria    Hydronephrosis with renal and ureteral calculous obstruction    Hyperlipidemia    Hypertension    Hypoxemia    Idiopathic peripheral autonomic neuropathy    Infestation by bed bug    Nephrolithiasis    OSA on CPAP    Osteoarthritis    Osteopenia    T=  -2.1 in hip   Persistent mood (affective) disorder, unspecified (HCC)    Prediabetes    Rheumatoid arthritis (HCC)    Rheumatoid arthritis(714.0)    S/P carpal tunnel release    Sepsis secondary to UTI [facility])    Spinal stenosis    Unspecified fracture of fifth lumbar vertebra, initial encounter for closed fracture (HCC)    Unspecified fracture of  t11-T12 vertebra, initial encounter for closed fracture (HCC)    Urinary incontinence    Urinary tract infection    hx of    Urolithiasis    Past Surgical History:  Procedure Laterality Date   APPENDECTOMY     BIOPSY  02/15/2023   Procedure: BIOPSY;  Surgeon: Suzette Espy, MD;  [facility]: [facility];  Service: Endoscopy;;   CARPAL TUNNEL RELEASE  12/27/2006   Right subcutaneous ulnar nerve transfer -- Right open carpal tunnel release   CHOLECYSTECTOMY     CYSTOSCOPY W/ URETERAL STENT PLACEMENT Right 01/30/2018   Procedure: CYSTOSCOPY WITH STENT REPLACEMENT retrograde pylegram;  Surgeon: Andrez Banker, MD;  Location: [facility];  Service: Urology;  Laterality: Right;   CYSTOSCOPY WITH URETEROSCOPY AND STENT PLACEMENT Right 03/01/2018   Procedure: CYSTOSCPY/RIGHT URETEROSCOPY HOLMIUM LASER LITHOTRIPSY AND STENT EXCHANGE;  Surgeon: Andrez Banker, MD;  Location: [facility];  Service: Urology;  Laterality: Right;   ESOPHAGOGASTRODUODENOSCOPY (EGD) WITH PROPOFOL  N/A 02/15/2023   Procedure: ESOPHAGOGASTRODUODENOSCOPY (EGD) WITH PROPOFOL ;  Surgeon: Suzette Espy, MD;  [facility];  Service: Endoscopy;  Laterality: N/A;  2:15 pm, asa 3/4   FOOT SURGERY     HIP ARTHROPLASTY Left  08/27/2020   Procedure: ARTHROPLASTY BIPOLAR HIP (HEMIARTHROPLASTY);  Surgeon: Tonita Frater, MD;  Location: [facility];  Service: Orthopedics;  Laterality: Left;   HOLMIUM LASER APPLICATION Right 03/01/2018   Procedure: HOLMIUM LASER APPLICATION;  Surgeon: Andrez Banker, MD;  Location: [facility];  Service: Urology;  Laterality: Right;   JOINT REPLACEMENT     BTKR, RTHR   MALONEY DILATION N/A 02/15/2023   Procedure: MALONEY DILATION;  Surgeon: Suzette Espy, MD;  [facility];  Service: Endoscopy;  Laterality: N/A;   TOTAL KNEE ARTHROPLASTY  08/21/2007   Right total knee replacement   Current Outpatient Medications on File Prior to Visit  Medication Sig Dispense Refill   albuterol   (VENTOLIN  HFA) 108 (90 Base) MCG/ACT inhaler Inhale 2 puffs into the lungs every 4 (four) hours as needed for wheezing or shortness of breath. 8 g 2   aspirin  EC 81 MG tablet Take 1 tablet (81 mg total) by mouth daily.     atenolol  (TENORMIN ) 25 MG tablet TAKE ONE-HALF TABLET BY MOUTH DAILY 45 tablet 1   Calcium  Carbonate-Vitamin D3 600-400 MG-UNIT TABS Take 1 tablet by mouth daily.     clotrimazole -betamethasone  (LOTRISONE ) cream Apply 1 Application topically daily as needed (fungal infection).     CRANBERRY PO Take 1 capsule by mouth daily at 12 noon.     DULoxetine  (CYMBALTA ) 30 MG capsule TAKE 1 CAPSULE BY MOUTH DAILY (Patient taking differently: Take 30 mg by mouth daily. Total daily dose 90 mg) 90 capsule 1   DULoxetine  (CYMBALTA ) 60 MG capsule TAKE 1 CAPSULE BY MOUTH DAILY IN ADDITION TO CYMBALTA  30 MG TO EQUAL A TOTAL DOSE OF 90 MG PER DAY (Patient taking differently: Take 60 mg by mouth daily. Total daily dose of 90 mg) 90 capsule 1   ezetimibe  (ZETIA ) 10 MG tablet TAKE 1 TABLET (10 MG TOTAL) BY MOUTH DAILY. 90 tablet 3   gabapentin  (NEURONTIN ) 800 MG tablet TAKE 1 TABLET BY MOUTH 3 TIMES DAILY 90 tablet 10   ketoconazole  (NIZORAL ) 2 % cream Apply 1 Application topically 2 (two) times daily. (Patient taking differently: Apply 1 Application topically 2 (two) times daily as needed for irritation.) 15 g 6   meloxicam  (MOBIC ) 7.5 MG tablet Take 1 tablet (7.5 mg total) by mouth daily. (Patient taking differently: Take 7.5 mg by mouth daily as needed for pain.) 90 tablet 0   pantoprazole  (PROTONIX ) 40 MG tablet TAKE 1 TABLET BY MOUTH ONCE DAILY 90 tablet 0   rosuvastatin  (CRESTOR ) 40 MG tablet TAKE 1 TABLET BY MOUTH IN THE EVENING 90 tablet 0   No current facility-administered medications on file prior to visit.   Allergies  Allergen Reactions   Sulfonamide Derivatives Hives and Rash   Social History   Socioeconomic History   Marital status: Divorced    Spouse name: Not on file   Number  of children: 6   Years of education: 8th grade   Highest education level: Not on file  Occupational History   Occupation: Retired  Tobacco Use   Smoking status: Never   Smokeless tobacco: Never  Vaping Use   Vaping status: Never Used  Substance and Sexual Activity   Alcohol use: No   Drug use: No   Sexual activity: Not Currently  Other Topics Concern   Not on file  Social History Narrative   Lives at home with 2 sons   She has four living children.   Right-handed.   Two cups caffeine per  day.   Social Drivers of Health   Financial Resource Strain: Low Risk  (04/27/2023)   Overall Financial Resource Strain (CARDIA)    Difficulty of Paying Living Expenses: Not hard at all  Food Insecurity: No Food Insecurity (08/24/2023)   Hunger Vital Sign    Worried About Running Out of Food in the Last Year: Never true    Ran Out of Food in the Last Year: Never true  Transportation Needs: No Transportation Needs (08/24/2023)   PRAPARE - Administrator, Civil Service (Medical): No    Lack of Transportation (Non-Medical): No  Physical Activity: Inactive (04/27/2023)   Exercise Vital Sign    Days of Exercise per Week: 0 days    Minutes of Exercise per Session: 0 min  Stress: No Stress Concern Present (04/27/2023)   [employer] of Occupational Health - Occupational Stress Questionnaire    Feeling of Stress : Not at all  Social Connections: Socially Isolated (08/24/2023)   Social Connection and Isolation Panel [NHANES]    Frequency of Communication with Friends and Family: More than three times a week    Frequency of Social Gatherings with Friends and Family: More than three times a week    Attends Religious Services: Never    Database administrator or Organizations: No    Attends Banker Meetings: Never    Marital Status: Divorced  Catering manager Violence: Patient Unable To Answer (08/24/2023)   Humiliation, Afraid, Rape, and Kick questionnaire    Fear of  Current or Ex-Partner: Patient unable to answer    Emotionally Abused: Patient unable to answer    Physically Abused: Patient unable to answer    Sexually Abused: Patient unable to answer     Review of Systems  HENT:  Positive for mouth sores.   All other systems reviewed and are negative.      Objective:   Physical Exam Constitutional:      General: She is not in acute distress.    Appearance: Normal appearance. She is normal weight. She is not ill-appearing, toxic-appearing or diaphoretic.  Cardiovascular:     Rate and Rhythm: Normal rate. Rhythm irregular.     Heart sounds: Normal heart sounds. No murmur heard.    No gallop.  Pulmonary:     Effort: Pulmonary effort is normal. No tachypnea or respiratory distress.     Breath sounds: No wheezing or rhonchi.  Chest:     Chest wall: No tenderness or edema.  Abdominal:     General: There is no distension.     Tenderness: There is no abdominal tenderness. There is no guarding or rebound.  Musculoskeletal:     Right lower leg: No edema.     Left lower leg: No edema.  Neurological:     Mental Status: She is alert.   Patient has faint bibasilar crackles that are due to atelectasis        Assessment & Plan:  Frequent UTI - Plan: CBC with Differential/Platelet, Basic Metabolic Panel Without GFR We discussed possibly starting prophylactic antibiotics to prevent recurrent urinary tract infections.  I believe it is only a matter of time before she has her next UTI.  The patient is very sedentary and confined to a chair.  She is also incontinent of urine and occasionally stool.  However we also discussed the likelihood of developing a resistant infection which would be harder to treat.  At the present time the patient is still able to verbalize  her symptoms.  Therefore I recommended that her son inquire on a daily basis if she is having any burning or pain with urination and we treat the patient sooner than aggressively if she has any  symptoms.  We will avoid prophylactic antibiotics at the present time.  Also recommended deep breathing exercises to help treat atelectasis

## 2023-09-14 ENCOUNTER — Other Ambulatory Visit: Payer: Self-pay | Admitting: Family Medicine

## 2023-09-14 ENCOUNTER — Ambulatory Visit: Payer: Self-pay | Admitting: Family Medicine

## 2023-09-14 DIAGNOSIS — E782 Mixed hyperlipidemia: Secondary | ICD-10-CM

## 2023-09-18 NOTE — Telephone Encounter (Signed)
 Requested medication (s) are due for refill today: yes  Requested medication (s) are on the active medication list: yes  Last refill:  09/19/22 #90/3  Future visit scheduled: no  Notes to clinic:  Unable to refill per protocol due to failed labs, no updated results.      Requested Prescriptions  Pending Prescriptions Disp Refills   ezetimibe  (ZETIA ) 10 MG tablet [Pharmacy Med Name: EZETIMIBE  10 MG TABS 10 Tablet] 90 tablet 11    Sig: TAKE 1 TABLET (10 MG TOTAL) BY MOUTH DAILY.     Cardiovascular:  Antilipid - Sterol Transport Inhibitors Failed - 09/18/2023  1:21 PM      Failed - Lipid Panel in normal range within the last 12 months    Cholesterol  Date Value Ref Range Status  07/01/2022 93 <200 mg/dL Final   LDL Cholesterol (Calc)  Date Value Ref Range Status  07/01/2022 27 mg/dL (calc) Final    Comment:    Reference range: <100 . Desirable range <100 mg/dL for primary prevention;   <70 mg/dL for patients with CHD or diabetic patients  with > or = 2 CHD risk factors. Aaron Aas LDL-C is now calculated using the Martin-Hopkins  calculation, which is a validated novel method providing  better accuracy than the Friedewald equation in the  estimation of LDL-C.  Melinda Sprawls et al. Erroll Heard. 8295;621(30): 2061-2068  (http://education.[facility].com/faq/FAQ164)    HDL  Date Value Ref Range Status  07/01/2022 48 (L) > OR = 50 mg/dL Final   Triglycerides  Date Value Ref Range Status  07/01/2022 98 <150 mg/dL Final         Passed - AST in normal range and within 360 days    AST  Date Value Ref Range Status  08/24/2023 28 15 - 41 U/L Final         Passed - ALT in normal range and within 360 days    ALT  Date Value Ref Range Status  08/24/2023 15 0 - 44 U/L Final         Passed - Patient is not pregnant      Passed - Valid encounter within last 12 months    Recent Outpatient Visits           6 days ago Frequent UTI   [place] [facility] Pickard,  Cisco Crest, MD   2 months ago Influenza   [place] [facility] Austine Lefort, MD   2 months ago Acute cystitis without hematuria   [place] [facility] Jenelle Mis, FNP   4 months ago Acute cystitis without hematuria   [place] [facility] Jenelle Mis, FNP   4 months ago Chronic pain of both knees   [place] [facility] Pickard, Cisco Crest, MD

## 2023-09-21 ENCOUNTER — Other Ambulatory Visit: Payer: Self-pay | Admitting: Family Medicine

## 2023-09-21 DIAGNOSIS — L304 Erythema intertrigo: Secondary | ICD-10-CM

## 2023-09-30 ENCOUNTER — Encounter: Payer: Self-pay | Admitting: Family Medicine

## 2023-10-02 ENCOUNTER — Telehealth: Payer: Self-pay

## 2023-10-02 NOTE — Telephone Encounter (Signed)
 Please see previous encounter

## 2023-10-02 NOTE — Telephone Encounter (Signed)
 First attempt to contact pt. For more information , no answer lvm or call back. [name]

## 2023-10-03 ENCOUNTER — Other Ambulatory Visit: Payer: Self-pay | Admitting: Family Medicine

## 2023-10-03 MED ORDER — CEPHALEXIN 500 MG PO CAPS
500.0000 mg | ORAL_CAPSULE | Freq: Three times a day (TID) | ORAL | 0 refills | Status: DC
Start: 2023-10-03 — End: 2024-03-27

## 2023-10-16 ENCOUNTER — Other Ambulatory Visit: Payer: Self-pay | Admitting: Family Medicine

## 2023-10-16 DIAGNOSIS — I6381 Other cerebral infarction due to occlusion or stenosis of small artery: Secondary | ICD-10-CM

## 2023-10-26 ENCOUNTER — Ambulatory Visit: Admitting: Podiatry

## 2023-11-10 ENCOUNTER — Other Ambulatory Visit: Payer: Self-pay | Admitting: Family Medicine

## 2023-11-23 ENCOUNTER — Ambulatory Visit: Admitting: Podiatry

## 2023-12-14 ENCOUNTER — Ambulatory Visit (INDEPENDENT_AMBULATORY_CARE_PROVIDER_SITE_OTHER): Admitting: Podiatry

## 2023-12-14 ENCOUNTER — Other Ambulatory Visit: Payer: Self-pay | Admitting: Family Medicine

## 2023-12-14 ENCOUNTER — Encounter: Payer: Self-pay | Admitting: Podiatry

## 2023-12-14 DIAGNOSIS — M79676 Pain in unspecified toe(s): Secondary | ICD-10-CM

## 2023-12-14 DIAGNOSIS — E1142 Type 2 diabetes mellitus with diabetic polyneuropathy: Secondary | ICD-10-CM

## 2023-12-14 DIAGNOSIS — D2372 Other benign neoplasm of skin of left lower limb, including hip: Secondary | ICD-10-CM

## 2023-12-14 DIAGNOSIS — B351 Tinea unguium: Secondary | ICD-10-CM | POA: Diagnosis not present

## 2023-12-14 DIAGNOSIS — D2371 Other benign neoplasm of skin of right lower limb, including hip: Secondary | ICD-10-CM | POA: Diagnosis not present

## 2023-12-14 DIAGNOSIS — R8271 Bacteriuria: Secondary | ICD-10-CM

## 2023-12-14 NOTE — Progress Notes (Signed)
 She presents today with her son chief concern of painful elongated toenails.  History of diabetes worsening neuropathy she says.  She states that she has no open sores to deal with.  Objective: Pulses are barely palpable bilateral feet are erythematous hammertoes are noted bilateral toenails are long thick yellow dystrophic with mycotic multiple benign skin lesions no preulcerative lesions noted.  Assessment: Pain in limb secondary to onychomycosis and nail dystrophy.  Plan: Debridement of toenails 1 through 5 bilateral debridement of benign skin lesions bilateral covered service.

## 2023-12-18 NOTE — Telephone Encounter (Signed)
 Requested Prescriptions  Pending Prescriptions Disp Refills   atenolol  (TENORMIN ) 25 MG tablet [Pharmacy Med Name: ATENOLOL  25 MG TABS 25 Tablet] 45 tablet 0    Sig: TAKE 1/2 TABLET BY MOUTH DAILY     Cardiovascular: Beta Blockers 2 Passed - 12/18/2023  3:25 PM      Passed - Cr in normal range and within 360 days    Creat  Date Value Ref Range Status  09/12/2023 0.83 0.60 - 0.95 mg/dL Final   Creatinine, Urine  Date Value Ref Range Status  03/08/2007 135.3 mg/dL Final         Passed - Last BP in normal range    BP Readings from Last 1 Encounters:  09/12/23 120/72         Passed - Last Heart Rate in normal range    Pulse Readings from Last 1 Encounters:  09/12/23 90         Passed - Valid encounter within last 6 months    Recent Outpatient Visits           3 months ago Frequent UTI   [place] [facility] Duanne Butler DASEN, MD   5 months ago Influenza   [place] [facility] Duanne Butler DASEN, MD   5 months ago Acute cystitis without hematuria   [place] [facility] Kayla Jeoffrey RAMAN, FNP   7 months ago Acute cystitis without hematuria   [place] [facility] Kayla Jeoffrey RAMAN, FNP   7 months ago Chronic pain of both knees   [place] [facility] Pickard, Butler DASEN, MD

## 2024-01-18 ENCOUNTER — Telehealth: Payer: Self-pay | Admitting: Family Medicine

## 2024-01-18 ENCOUNTER — Other Ambulatory Visit: Payer: Self-pay

## 2024-01-18 DIAGNOSIS — L304 Erythema intertrigo: Secondary | ICD-10-CM

## 2024-01-18 MED ORDER — CLOTRIMAZOLE-BETAMETHASONE 1-0.05 % EX CREA: TOPICAL_CREAM | Freq: Every day | CUTANEOUS | 1 refills | Status: DC

## 2024-01-18 NOTE — Telephone Encounter (Signed)
 Prescription Request  01/18/2024  LOV: 09/12/2023  What is the name of the medication or equipment?   clotrimazole -betamethasone  (LOTRISONE ) cream   Have you contacted your pharmacy to request a refill? Yes   Which pharmacy would you like this sent to?  [id number] - [place], [place] - [address] [place] [address] Phone: [phone] Fax: [phone]    Patient notified that their request is being sent to the clinical staff for review and that they should receive a response within 2 business days.   Please advise pharmacist.

## 2024-01-25 ENCOUNTER — Ambulatory Visit: Payer: Self-pay

## 2024-01-25 NOTE — Telephone Encounter (Signed)
 FYI Only or Action Required?: FYI only for provider.  Patient was last seen in primary care on 09/12/2023 by Duanne Butler DASEN, MD.  Called Nurse Triage reporting Headache.  Symptoms began several days ago.  Interventions attempted: OTC medications: Tylenol , Prescription medications: Meloxicam , and Rest, hydration, or home remedies.  Symptoms are: unchanged.  Triage Disposition: See Physician Within 24 Hours- Appointment scheduled  Patient/caregiver understands and will follow disposition?: Yes  Copied from CRM #[id number]. Topic: Clinical - Red Word Triage >> Jan 25, 2024 10:39 AM Charlet HERO wrote: Red Word that prompted transfer to Nurse Triage: Patient is calling about severe headaches it has been going on for the last few days son Ludie on the line . Dr Duanne BROODY Reason for Disposition  [1] MODERATE headache (e.g., interferes with normal activities) AND [2] present > 24 hours AND [3] unexplained  (Exceptions: Pain medicines not tried, typical migraine, or headache part of viral illness.)  Answer Assessment - Initial Assessment Questions Has had headaches before but not migraines- son states it is something with her ears making her head hurt per previous visit last year. No ear pain, no drainage.    1. LOCATION: Where does it hurt?      Son states she holds her head on the right side 2. ONSET: When did the headache start? (e.g., minutes, hours, days)      2days but have increased in frequency lately  3. PATTERN: Does the pain come and go, or has it been constant since it started?     Once a week when it was previously once a month max 4. SEVERITY: How bad is the pain? and What does it keep you from doing?  (e.g., Scale 1-10; mild, moderate, or severe)     6-7/10 son guesses based on her complaints and visual pain queues 5. RECURRENT SYMPTOM: Have you ever had headaches before? If Yes, ask: When was the last time? and What happened that time?     Tylenol  usually  helps but isn't helping this time. Meloxicam  6. CAUSE: What do you think is causing the headache?     Inner ear 7. MIGRAINE: Have you been diagnosed with migraine headaches? If Yes, ask: Is this headache similar?      denies 8. HEAD INJURY: Has there been any recent injury to your head?      No recent falls 9. OTHER SYMPTOMS: Do you have any other symptoms? (e.g., fever, stiff neck, eye pain, sore throat, cold symptoms)     Mild intermittent cough to clear phlegm  Protocols used: Headache-A-AH

## 2024-01-26 ENCOUNTER — Encounter: Payer: Self-pay | Admitting: Family Medicine

## 2024-01-26 ENCOUNTER — Ambulatory Visit (INDEPENDENT_AMBULATORY_CARE_PROVIDER_SITE_OTHER): Admitting: Family Medicine

## 2024-01-26 VITALS — BP 118/60 | HR 69 | Temp 98.9°F | Ht 59.0 in | Wt 165.0 lb

## 2024-01-26 DIAGNOSIS — G44209 Tension-type headache, unspecified, not intractable: Secondary | ICD-10-CM | POA: Diagnosis not present

## 2024-01-26 MED ORDER — PREDNISONE 20 MG PO TABS: ORAL_TABLET | ORAL | 0 refills | Status: DC

## 2024-01-26 MED ORDER — TIZANIDINE HCL 4 MG PO TABS: 4.0000 mg | ORAL_TABLET | Freq: Four times a day (QID) | ORAL | 0 refills | Status: AC | PRN

## 2024-01-26 NOTE — Progress Notes (Signed)
 Subjective:    Patient ID: [name], female    DOB: [birth year], 89 y.o.   MRN: [id number]  Patient reports a 2-day history of headaches.  Headaches are located in the right temple and in her right forehead and behind the right eye.  She states that they come and go.  They often last 3 to 5 minutes.  At other times they disappear.  She states that they feel like a drum beating in her forehead.  She had a CT scan of her brain in January of this year that showed no abnormalities specifically no sign of an aneurysm or any type of space-occupying lesion.  She denies any neurologic deficits today on exam.  She is smiling and laughing during our encounter.  I asked her to quantify the pain when this happens on a scale of 1-10.  She is unable to do so but she states that the pain is moderate in intensity.  She has no history of migraines.  She denies any blurry vision.  She denies any jaw claudication.  There is no tenderness to palpation over the temporal artery.  Neurologic exam today is normal.  She denies any sinus pain or otalgia or sore throat.  She denies any neck pain or stiffness  Past Medical History:  Diagnosis Date   [name] (abdominal aortic aneurysm) 09/2013   3.6 cm   Acute and chronic respiratory failure with hypoxia (HCC)    Acute delirium    Acute encephalopathy    Acute pyelonephritis    Atrial fibrillation (HCC)    Chronic pain syndrome    Constipation    Dementia (HCC)    Dependent edema    bilateral legs    Depression    Diabetes mellitus    pre-diabetes   Diverticulosis    Diverticulosis    Dysrhythmia    Dysuria    E coli bacteremia    Fall    Gross hematuria    Hydronephrosis with renal and ureteral calculous obstruction    Hyperlipidemia    Hypertension    Hypoxemia    Idiopathic peripheral autonomic neuropathy    Infestation by bed bug    Nephrolithiasis    OSA on CPAP    Osteoarthritis    Osteopenia    T=  -2.1 in hip   Persistent mood (affective)  disorder, unspecified    Prediabetes    Rheumatoid arthritis (HCC)    Rheumatoid arthritis(714.0)    S/P carpal tunnel release    Sepsis secondary to UTI [facility])    Spinal stenosis    Unspecified fracture of fifth lumbar vertebra, initial encounter for closed fracture (HCC)    Unspecified fracture of t11-T12 vertebra, initial encounter for closed fracture (HCC)    Urinary incontinence    Urinary tract infection    hx of    Urolithiasis    Past Surgical History:  Procedure Laterality Date   APPENDECTOMY     BIOPSY  02/15/2023   Procedure: BIOPSY;  Surgeon: Shaaron Lamar HERO, MD;  [facility];  Service: Endoscopy;;   CARPAL TUNNEL RELEASE  12/27/2006   Right subcutaneous ulnar nerve transfer -- Right open carpal tunnel release   CHOLECYSTECTOMY     CYSTOSCOPY W/ URETERAL STENT PLACEMENT Right 01/30/2018   Procedure: CYSTOSCOPY WITH STENT REPLACEMENT retrograde pylegram;  Surgeon: Cam Morene ORN, MD;  Location: [facility];  Service: Urology;  Laterality: Right;   CYSTOSCOPY WITH URETEROSCOPY AND STENT PLACEMENT Right 03/01/2018   Procedure:  CYSTOSCPY/RIGHT URETEROSCOPY HOLMIUM LASER LITHOTRIPSY AND STENT EXCHANGE;  Surgeon: Cam Morene ORN, MD;  Location: [facility];  Service: Urology;  Laterality: Right;   ESOPHAGOGASTRODUODENOSCOPY (EGD) WITH PROPOFOL  N/A 02/15/2023   Procedure: ESOPHAGOGASTRODUODENOSCOPY (EGD) WITH PROPOFOL ;  Surgeon: Shaaron Lamar HERO, MD;  [facility];  Service: Endoscopy;  Laterality: N/A;  2:15 pm, asa 3/4   FOOT SURGERY     HIP ARTHROPLASTY Left 08/27/2020   Procedure: ARTHROPLASTY BIPOLAR HIP (HEMIARTHROPLASTY);  Surgeon: Onesimo Oneil LABOR, MD;  Location: [facility];  Service: Orthopedics;  Laterality: Left;   HOLMIUM LASER APPLICATION Right 03/01/2018   Procedure: HOLMIUM LASER APPLICATION;  Surgeon: Cam Morene ORN, MD;  [facility];  Service: Urology;  Laterality: Right;   JOINT REPLACEMENT     BTKR, RTHR   MALONEY DILATION N/A  02/15/2023   Procedure: MALONEY DILATION;  Surgeon: Shaaron Lamar HERO, MD;  [facility];  Service: Endoscopy;  Laterality: N/A;   TOTAL KNEE ARTHROPLASTY  08/21/2007   Right total knee replacement   Current Outpatient Medications on File Prior to Visit  Medication Sig Dispense Refill   albuterol  (VENTOLIN  HFA) 108 (90 Base) MCG/ACT inhaler Inhale 2 puffs into the lungs every 4 (four) hours as needed for wheezing or shortness of breath. 8 g 2   aspirin  EC 81 MG tablet Take 1 tablet (81 mg total) by mouth daily.     atenolol  (TENORMIN ) 25 MG tablet TAKE 1/2 TABLET BY MOUTH DAILY 45 tablet 0   Calcium  Carbonate-Vitamin D3 600-400 MG-UNIT TABS Take 1 tablet by mouth daily.     cephALEXin  (KEFLEX ) 500 MG capsule Take 1 capsule (500 mg total) by mouth 3 (three) times daily. 21 capsule 0   clotrimazole -betamethasone  (LOTRISONE ) cream Apply topically daily. 30 g 1   CRANBERRY PO Take 1 capsule by mouth daily at 12 noon.     DULoxetine  (CYMBALTA ) 30 MG capsule TAKE 1 CAPSULE BY MOUTH DAILY (Patient taking differently: Take 30 mg by mouth daily. Total daily dose 90 mg) 90 capsule 1   DULoxetine  (CYMBALTA ) 60 MG capsule TAKE 1 CAPSULE BY MOUTH DAILY IN ADDITION TO CYMBALTA  30 MG TO EQUAL A TOTAL DOSE OF 90 MG PER DAY (Patient taking differently: Take 60 mg by mouth daily. Total daily dose of 90 mg) 90 capsule 1   ezetimibe  (ZETIA ) 10 MG tablet TAKE 1 TABLET (10 MG TOTAL) BY MOUTH DAILY. 90 tablet 2   gabapentin  (NEURONTIN ) 800 MG tablet TAKE 1 TABLET BY MOUTH 3 TIMES DAILY 90 tablet 10   ketoconazole  (NIZORAL ) 2 % cream Apply 1 Application topically 2 (two) times daily. (Patient taking differently: Apply 1 Application topically 2 (two) times daily as needed for irritation.) 15 g 6   meloxicam  (MOBIC ) 7.5 MG tablet Take 1 tablet (7.5 mg total) by mouth daily as needed for pain. 30 tablet 1   pantoprazole  (PROTONIX ) 40 MG tablet TAKE 1 TABLET BY MOUTH ONCE DAILY 90 tablet 11   rosuvastatin   (CRESTOR ) 40 MG tablet TAKE 1 TABLET BY MOUTH IN THE EVENING 90 tablet 11   No current facility-administered medications on file prior to visit.   Allergies  Allergen Reactions   Sulfonamide Derivatives Hives and Rash   Social History   Socioeconomic History   Marital status: Divorced    Spouse name: Not on file   Number of children: 6   Years of education: 8th grade   Highest education level: Not on file  Occupational History   Occupation: Retired  Tobacco Use   Smoking status: Never   Smokeless tobacco: Never  Vaping Use   Vaping status: Never Used  Substance and Sexual Activity   Alcohol use: No   Drug use: No   Sexual activity: Not Currently  Other Topics Concern   Not on file  Social History Narrative   Lives at home with 2 sons   She has four living children.   Right-handed.   Two cups caffeine per day.   Social Drivers of Corporate investment banker Strain: Low Risk  (04/27/2023)   Overall Financial Resource Strain (CARDIA)    Difficulty of Paying Living Expenses: Not hard at all  Food Insecurity: No Food Insecurity (08/24/2023)   Hunger Vital Sign    Worried About Running Out of Food in the Last Year: Never true    Ran Out of Food in the Last Year: Never true  Transportation Needs: No Transportation Needs (08/24/2023)   PRAPARE - Administrator, Civil Service (Medical): No    Lack of Transportation (Non-Medical): No  Physical Activity: Inactive (04/27/2023)   Exercise Vital Sign    Days of Exercise per Week: 0 days    Minutes of Exercise per Session: 0 min  Stress: No Stress Concern Present (04/27/2023)   [employer] of Occupational Health - Occupational Stress Questionnaire    Feeling of Stress : Not at all  Social Connections: Socially Isolated (08/24/2023)   Social Connection and Isolation Panel    Frequency of Communication with Friends and Family: More than three times a week    Frequency of Social Gatherings with Friends and  Family: More than three times a week    Attends Religious Services: Never    Database administrator or Organizations: No    Attends Banker Meetings: Never    Marital Status: Divorced  Catering manager Violence: Patient Unable To Answer (08/24/2023)   Humiliation, Afraid, Rape, and Kick questionnaire    Fear of Current or Ex-Partner: Patient unable to answer    Emotionally Abused: Patient unable to answer    Physically Abused: Patient unable to answer    Sexually Abused: Patient unable to answer     Review of Systems  HENT:  Positive for mouth sores.   Neurological:  Positive for headaches.  All other systems reviewed and are negative.      Objective:   Physical Exam Constitutional:      General: She is not in acute distress.    Appearance: Normal appearance. She is normal weight. She is not ill-appearing, toxic-appearing or diaphoretic.  Cardiovascular:     Rate and Rhythm: Normal rate. Rhythm irregular.     Heart sounds: Normal heart sounds. No murmur heard.    No gallop.  Pulmonary:     Effort: Pulmonary effort is normal. No tachypnea or respiratory distress.     Breath sounds: No wheezing or rhonchi.  Chest:     Chest wall: No tenderness or edema.  Abdominal:     General: There is no distension.     Tenderness: There is no abdominal tenderness. There is no guarding or rebound.  Musculoskeletal:     Right lower leg: No edema.     Left lower leg: No edema.  Neurological:     Mental Status: She is alert.   Patient has faint bibasilar crackles that are due to atelectasis        Assessment & Plan:  Acute non intractable tension-type headache With patient  is pain-free at the present time.  I do not feel that this represents a sinus infection.  Differential diagnosis includes migraines given the fact it is unilateral and pulsatile, possibly a tension headache with muscle spasms, less likely would be temporal arteritis especially since it is episodic, and  very unlikely would be an aneurysm given the fact it comes and goes.  Therefore I favor either a migraine versus a tension headache.  I recommended trying a prednisone  taper pack coupled with tizanidine  4 mg every 6 hours to treat possibly both a migraine or tension headache.  If not improving recheck next week.  If suddenly worsening, seek medical attention immediately

## 2024-02-07 NOTE — Progress Notes (Unsigned)
 Office Note    HPI: Carliss Porcaro is a 89 y.o. (Sep 22, 1933) female presenting in follow-up with known pararenal aneurysm initially found incidentally on CT.  Aneurysm measures roughly 5.5 cm per  Her initial presentation was for failure to thrive, malaise, fever.  She was flu positive, and had a UTI.  Fortunately, she has recovered from this.    On exam today, Denine was doing well, accompanied by her son.  Similar to her last visit, Lashawnta presented in a wheelchair-she is able to stand and take a few steps with use of a walker and help, but that is it.  Her only mobility is using her wheelchair to get to and from the bathroom, otherwise sits on the couch and watches TV for the majority of the day.  Diet consist mostly of bacon and pancakes.  She denies food fear, weight loss, postprandial pain.   She denies chest pain, back pain, abdominal pain Does not ambulate enough for claudication.  No rest pain, no tissue loss.  Has had bluish discoloration in the feet for a number of years. Major complaint today was arthritic pain in the knees and ankles.   Past Medical History:  Diagnosis Date   [name] (abdominal aortic aneurysm) 09/2013   3.6 cm   Acute and chronic respiratory failure with hypoxia (HCC)    Acute delirium    Acute encephalopathy    Acute pyelonephritis    Atrial fibrillation (HCC)    Chronic pain syndrome    Constipation    Dementia (HCC)    Dependent edema    bilateral legs    Depression    Diabetes mellitus    pre-diabetes   Diverticulosis    Diverticulosis    Dysrhythmia    Dysuria    E coli bacteremia    Fall    Gross hematuria    Hydronephrosis with renal and ureteral calculous obstruction    Hyperlipidemia    Hypertension    Hypoxemia    Idiopathic peripheral autonomic neuropathy    Infestation by bed bug    Nephrolithiasis    OSA on CPAP    Osteoarthritis    Osteopenia    T=  -2.1 in hip   Persistent mood (affective) disorder, unspecified    Prediabetes     Rheumatoid arthritis (HCC)    Rheumatoid arthritis(714.0)    S/P carpal tunnel release    Sepsis secondary to UTI [facility])    Spinal stenosis    Unspecified fracture of fifth lumbar vertebra, initial encounter for closed fracture (HCC)    Unspecified fracture of t11-T12 vertebra, initial encounter for closed fracture (HCC)    Urinary incontinence    Urinary tract infection    hx of    Urolithiasis     Past Surgical History:  Procedure Laterality Date   APPENDECTOMY     BIOPSY  02/15/2023   Procedure: BIOPSY;  Surgeon: Shaaron Lamar HERO, MD;  [facility];  Service: Endoscopy;;   CARPAL TUNNEL RELEASE  12/27/2006   Right subcutaneous ulnar nerve transfer -- Right open carpal tunnel release   CHOLECYSTECTOMY     CYSTOSCOPY W/ URETERAL STENT PLACEMENT Right 01/30/2018   Procedure: CYSTOSCOPY WITH STENT REPLACEMENT retrograde pylegram;  Surgeon: Cam Morene ORN, MD;  [facility];  Service: Urology;  Laterality: Right;   CYSTOSCOPY WITH URETEROSCOPY AND STENT PLACEMENT Right 03/01/2018   Procedure: CYSTOSCPY/RIGHT URETEROSCOPY HOLMIUM LASER LITHOTRIPSY AND STENT EXCHANGE;  Surgeon: Cam Morene ORN, MD;  [facility];  Service: Urology;  Laterality: Right;   ESOPHAGOGASTRODUODENOSCOPY (EGD) WITH PROPOFOL  N/A 02/15/2023   Procedure: ESOPHAGOGASTRODUODENOSCOPY (EGD) WITH PROPOFOL ;  Surgeon: Shaaron Lamar HERO, MD;  [facility];  Service: Endoscopy;  Laterality: N/A;  2:15 pm, asa 3/4   FOOT SURGERY     HIP ARTHROPLASTY Left 08/27/2020   Procedure: ARTHROPLASTY BIPOLAR HIP (HEMIARTHROPLASTY);  Surgeon: Onesimo Oneil LABOR, MD;  Location: [facility];  Service: Orthopedics;  Laterality: Left;   HOLMIUM LASER APPLICATION Right 03/01/2018   Procedure: HOLMIUM LASER APPLICATION;  Surgeon: Cam Morene ORN, MD;  [facility];  Service: Urology;  Laterality: Right;   JOINT REPLACEMENT     BTKR, RTHR   MALONEY DILATION N/A 02/15/2023   Procedure: MALONEY DILATION;   Surgeon: Shaaron Lamar HERO, MD;  [facility];  Service: Endoscopy;  Laterality: N/A;   TOTAL KNEE ARTHROPLASTY  08/21/2007   Right total knee replacement    Social History   Socioeconomic History   Marital status: Divorced    Spouse name: Not on file   Number of children: 6   Years of education: 8th grade   Highest education level: Not on file  Occupational History   Occupation: Retired  Tobacco Use   Smoking status: Never   Smokeless tobacco: Never  Vaping Use   Vaping status: Never Used  Substance and Sexual Activity   Alcohol use: No   Drug use: No   Sexual activity: Not Currently  Other Topics Concern   Not on file  Social History Narrative   Lives at home with 2 sons   She has four living children.   Right-handed.   Two cups caffeine per day.   Social Drivers of Corporate investment banker Strain: Low Risk  (04/27/2023)   Overall Financial Resource Strain (CARDIA)    Difficulty of Paying Living Expenses: Not hard at all  Food Insecurity: No Food Insecurity (08/24/2023)   Hunger Vital Sign    Worried About Running Out of Food in the Last Year: Never true    Ran Out of Food in the Last Year: Never true  Transportation Needs: No Transportation Needs (08/24/2023)   PRAPARE - Administrator, Civil Service (Medical): No    Lack of Transportation (Non-Medical): No  Physical Activity: Inactive (04/27/2023)   Exercise Vital Sign    Days of Exercise per Week: 0 days    Minutes of Exercise per Session: 0 min  Stress: No Stress Concern Present (04/27/2023)   [employer] of Occupational Health - Occupational Stress Questionnaire    Feeling of Stress : Not at all  Social Connections: Socially Isolated (08/24/2023)   Social Connection and Isolation Panel    Frequency of Communication with Friends and Family: More than three times a week    Frequency of Social Gatherings with Friends and Family: More than three times a week    Attends Religious  Services: Never    Database administrator or Organizations: No    Attends Banker Meetings: Never    Marital Status: Divorced  Catering manager Violence: Patient Unable To Answer (08/24/2023)   Humiliation, Afraid, Rape, and Kick questionnaire    Fear of Current or Ex-Partner: Patient unable to answer    Emotionally Abused: Patient unable to answer    Physically Abused: Patient unable to answer    Sexually Abused: Patient unable to answer   Family History  Problem Relation Age of Onset   Stroke Mother  Stroke Father    Hypertension Sister    Diabetes Brother    Coronary artery disease Son        CABG at age 102   Heart disease Son    Heart attack Son    Hypertension Maternal Aunt    Cancer Daughter    Colon cancer Neg Hx     Current Outpatient Medications  Medication Sig Dispense Refill   albuterol  (VENTOLIN  HFA) 108 (90 Base) MCG/ACT inhaler Inhale 2 puffs into the lungs every 4 (four) hours as needed for wheezing or shortness of breath. 8 g 2   aspirin  EC 81 MG tablet Take 1 tablet (81 mg total) by mouth daily.     atenolol  (TENORMIN ) 25 MG tablet TAKE 1/2 TABLET BY MOUTH DAILY 45 tablet 0   Calcium  Carbonate-Vitamin D3 600-400 MG-UNIT TABS Take 1 tablet by mouth daily.     cephALEXin  (KEFLEX ) 500 MG capsule Take 1 capsule (500 mg total) by mouth 3 (three) times daily. 21 capsule 0   clotrimazole -betamethasone  (LOTRISONE ) cream Apply topically daily. 30 g 1   CRANBERRY PO Take 1 capsule by mouth daily at 12 noon.     DULoxetine  (CYMBALTA ) 30 MG capsule TAKE 1 CAPSULE BY MOUTH DAILY (Patient taking differently: Take 30 mg by mouth daily. Total daily dose 90 mg) 90 capsule 1   DULoxetine  (CYMBALTA ) 60 MG capsule TAKE 1 CAPSULE BY MOUTH DAILY IN ADDITION TO CYMBALTA  30 MG TO EQUAL A TOTAL DOSE OF 90 MG PER DAY (Patient taking differently: Take 60 mg by mouth daily. Total daily dose of 90 mg) 90 capsule 1   ezetimibe  (ZETIA ) 10 MG tablet TAKE 1 TABLET (10 MG TOTAL) BY  MOUTH DAILY. 90 tablet 2   gabapentin  (NEURONTIN ) 800 MG tablet TAKE 1 TABLET BY MOUTH 3 TIMES DAILY 90 tablet 10   ketoconazole  (NIZORAL ) 2 % cream Apply 1 Application topically 2 (two) times daily. (Patient taking differently: Apply 1 Application topically 2 (two) times daily as needed for irritation.) 15 g 6   meloxicam  (MOBIC ) 7.5 MG tablet Take 1 tablet (7.5 mg total) by mouth daily as needed for pain. 30 tablet 1   pantoprazole  (PROTONIX ) 40 MG tablet TAKE 1 TABLET BY MOUTH ONCE DAILY 90 tablet 11   predniSONE  (DELTASONE ) 20 MG tablet 3 tabs poqday 1-2, 2 tabs poqday 3-4, 1 tab poqday 5-6 12 tablet 0   rosuvastatin  (CRESTOR ) 40 MG tablet TAKE 1 TABLET BY MOUTH IN THE EVENING 90 tablet 11   tiZANidine  (ZANAFLEX ) 4 MG tablet Take 1 tablet (4 mg total) by mouth every 6 (six) hours as needed (headache). 30 tablet 0   No current facility-administered medications for this visit.    Allergies  Allergen Reactions   Sulfonamide Derivatives Hives and Rash     REVIEW OF SYSTEMS:  [X]  denotes positive finding, [ ]  denotes negative finding Cardiac  Comments:  Chest pain or chest pressure:    Shortness of breath upon exertion:    Short of breath when lying flat:    Irregular heart rhythm:        Vascular    Pain in calf, thigh, or hip brought on by ambulation:    Pain in feet at night that wakes you up from your sleep:     Blood clot in your veins:    Leg swelling:         Pulmonary    Oxygen at home:    Productive cough:     Wheezing:  Neurologic    Sudden weakness in arms or legs:     Sudden numbness in arms or legs:     Sudden onset of difficulty speaking or slurred speech:    Temporary loss of vision in one eye:     Problems with dizziness:         Gastrointestinal    Blood in stool:     Vomited blood:         Genitourinary    Burning when urinating:     Blood in urine:        Psychiatric    Major depression:         Hematologic    Bleeding problems:     Problems with blood clotting too easily:        Skin    Rashes or ulcers:        Constitutional    Fever or chills:      PHYSICAL EXAMINATION:  There were no vitals filed for this visit.   General:  WDWN in NAD; vital signs documented above Gait: Not observed HENT: WNL, normocephalic Pulmonary: normal non-labored breathing , without wheezing Cardiac: regular HR Abdomen: soft, NT, no masses Skin: without rashes Vascular Exam/Pulses:  Right Left  Radial 2+ (normal) 2+ (normal)              DP 2+ 2+       Extremities: without ischemic changes, without Gangrene , without cellulitis; without open wounds;  Musculoskeletal: no muscle wasting or atrophy  Neurologic: A&O X 3;  No focal weakness or paresthesias are detected Psychiatric:  The pt has Normal affect.   Non-Invasive Vascular Imaging:     Abdominal aortic ultrasound obtained today demonstrates aorta measuring roughly 4.5 cm.  This is obviously incorrect as the aneurysm is noted to be roughly 5.5 cm on CT scan.   ASSESSMENT/PLAN: Barbette Mcglaun is a 89 y.o. female presenting with asymptomatic 5.5 cm pararenal aneurysm.  Follow-up abdominal aortic ultrasound today demonstrated a size of 4.5 cm.  This is obviously incorrect as prior CT demonstrated an aneurysm size of 5.5 cm.  I evaluated both the CT scan from March, as well as April.  Similar to her last visit, I had a long conversation with Shemaiah and her son.  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  We discussed that we could repeat a CT scan to assess for any size change, but that he made it very clear that she was not interested in any further surgeries, especially any surgery that could kill her.  Taking into account her age, comorbidities, and anatomy of her aneurysm  and subsequent branches, I think her mortality risk would be over 20%.  Roselind elected to continue medical management with no plans for intervention even if rupture should occur.  I think her decision is very appropriate in this case.  Both her and her son are aware that I am happy to help should any questions or concerns arise.    I asked her to call my office should any questions or concerns arise.  I am happy to see her back should she change her mind regarding surgery.     If family chooses to move forward with surgery, I think the case would require physician modified endograft with likely prestenting of the superior mesenteric  artery and celiac artery due to severe stenosis.  No plan for any mesenteric intervention at this time as she remains asymptomatic.   Fonda FORBES Rim, MD [facility] [phone]

## 2024-02-08 ENCOUNTER — Ambulatory Visit (INDEPENDENT_AMBULATORY_CARE_PROVIDER_SITE_OTHER): Admitting: Vascular Surgery

## 2024-02-08 ENCOUNTER — Encounter: Payer: Self-pay | Admitting: Vascular Surgery

## 2024-02-08 ENCOUNTER — Ambulatory Visit (HOSPITAL_COMMUNITY)
Admission: RE | Admit: 2024-02-08 | Discharge: 2024-02-08 | Disposition: A | Discharge status: Home / Self Care | Source: Ambulatory Visit | Attending: Vascular Surgery | Admitting: Vascular Surgery

## 2024-02-08 VITALS — BP 107/69 | HR 84 | Temp 97.6°F | Resp 20 | Ht 59.0 in | Wt 165.0 lb

## 2024-02-08 DIAGNOSIS — I7141 Pararenal abdominal aortic aneurysm, without rupture: Secondary | ICD-10-CM | POA: Diagnosis present

## 2024-02-13 ENCOUNTER — Other Ambulatory Visit: Payer: Self-pay | Admitting: Family Medicine

## 2024-02-13 DIAGNOSIS — G894 Chronic pain syndrome: Secondary | ICD-10-CM

## 2024-03-11 ENCOUNTER — Telehealth: Payer: Self-pay | Admitting: Family Medicine

## 2024-03-11 NOTE — Telephone Encounter (Signed)
 Copied from CRM #[id number]. Topic: Referral - Request for Referral >> Mar 11, 2024  9:25 AM Amy B wrote: Did the patient discuss referral with their provider in the last year? No (If No - schedule appointment) (If Yes - send message)  Appointment offered? No  Type of order/referral and detailed reason for visit: ENT  Preference of office, provider, location: [facility]  If referral order, have you been seen by this specialty before? Yes (If Yes, this issue or another issue? When? Where?  Can we respond through MyChart? No

## 2024-03-12 ENCOUNTER — Ambulatory Visit (INDEPENDENT_AMBULATORY_CARE_PROVIDER_SITE_OTHER): Admitting: Family Medicine

## 2024-03-12 ENCOUNTER — Other Ambulatory Visit: Payer: Self-pay

## 2024-03-12 ENCOUNTER — Ambulatory Visit: Payer: Self-pay

## 2024-03-12 ENCOUNTER — Ambulatory Visit: Admitting: Podiatry

## 2024-03-12 VITALS — BP 132/68 | HR 56 | Temp 98.7°F | Ht 59.0 in | Wt 165.0 lb

## 2024-03-12 DIAGNOSIS — Z23 Encounter for immunization: Secondary | ICD-10-CM | POA: Diagnosis not present

## 2024-03-12 DIAGNOSIS — G4452 New daily persistent headache (NDPH): Secondary | ICD-10-CM

## 2024-03-12 DIAGNOSIS — H9193 Unspecified hearing loss, bilateral: Secondary | ICD-10-CM

## 2024-03-12 MED ORDER — TOPIRAMATE 25 MG PO TABS: 50.0000 mg | ORAL_TABLET | Freq: Two times a day (BID) | ORAL | 3 refills | Status: DC

## 2024-03-12 NOTE — Telephone Encounter (Signed)
 FYI Only or Action Required?: FYI only for provider: appointment scheduled on today.  Patient was last seen in primary care on 01/26/2024 by Duanne Butler DASEN, MD.  Called Nurse Triage reporting Headache.  Symptoms began ongoing problem.  Symptoms are: gradually worsening.  Triage Disposition: See Physician Within 24 Hours  Patient/caregiver understands and will follow disposition?: Yes     Copied from CRM #[id number]. Topic: Clinical - Red Word Triage >> Mar 12, 2024 10:35 AM [name] wrote: Red Word that prompted transfer to Nurse Triage: Patient was seen last month for headaches and she's still having headaches. She feels like they are getting worse.      Reason for Disposition  [1] MODERATE headache (e.g., interferes with normal activities) AND [2] present > 24 hours AND [3] unexplained  (Exceptions: Pain medicines not tried, typical migraine, or headache part of viral illness.)  Answer Assessment - Initial Assessment Questions 1. LOCATION: Where does it hurt?      Right sided 2. ONSET: When did the headache start? (e.g., minutes, hours, days)      Ongoing problem 3. PATTERN: Does the pain come and go, or has it been constant since it started?     Intermittent  4. SEVERITY: How bad is the pain? and What does it keep you from doing?  (e.g., Scale 1-10; mild, moderate, or severe)     Mild to moderate  5. RECURRENT SYMPTOM: Have you ever had headaches before? If Yes, ask: When was the last time? and What happened that time?      Yes 6. CAUSE: What do you think is causing the headache?     Unsure  7. MIGRAINE: Have you been diagnosed with migraine headaches? If Yes, ask: Is this headache similar?      No 8. HEAD INJURY: Has there been any recent injury to your head?      No 9. OTHER SYMPTOMS: Do you have any other symptoms? (e.g., fever, stiff neck, eye pain, sore throat, cold symptoms)     No  Protocols used: Headache-A-AH

## 2024-03-12 NOTE — Progress Notes (Signed)
 Subjective:    Patient ID: [name], female    DOB: 03/02/1934, 89 y.o.   MRN: [id number]  Patient has been dealing with near daily headaches for the last 2 months.  Headaches are located in the right temple and in her right forehead and behind the right eye.  She states that they come and go.  They often last 3 to 5 minutes.  At other times they disappear.  She states that they feel like a drum beating in her forehead.  I treated her with tizanidine  for possible tension headache in September and this seemed to help but she stopped the medication due to how it made her feel.  She denies any neurologic deficits today on exam.  She is smiling and laughing during our encounter.  She states the pain is moderate.  It resolves on its own.  There are no exacerbating or alleviating factors.  She has no history of migraines.  She denies any blurry vision.  She denies any jaw claudication.  There is no tenderness to palpation over the temporal artery.  Neurologic exam today is normal.  She denies any sinus pain or otalgia or sore throat.  She denies any neck pain or stiffness  Past Medical History:  Diagnosis Date   [name] (abdominal aortic aneurysm) 09/2013   3.6 cm   Acute and chronic respiratory failure with hypoxia (HCC)    Acute delirium    Acute encephalopathy    Acute pyelonephritis    Atrial fibrillation (HCC)    Chronic pain syndrome    Constipation    Dementia (HCC)    Dependent edema    bilateral legs    Depression    Diabetes mellitus    pre-diabetes   Diverticulosis    Diverticulosis    Dysrhythmia    Dysuria    E coli bacteremia    Fall    Gross hematuria    Hydronephrosis with renal and ureteral calculous obstruction    Hyperlipidemia    Hypertension    Hypoxemia    Idiopathic peripheral autonomic neuropathy    Infestation by bed bug    Nephrolithiasis    OSA on CPAP    Osteoarthritis    Osteopenia    T=  -2.1 in hip   Persistent mood (affective) disorder,  unspecified    Prediabetes    Rheumatoid arthritis (HCC)    Rheumatoid arthritis(714.0)    S/P carpal tunnel release    Sepsis secondary to UTI [facility])    Spinal stenosis    Unspecified fracture of fifth lumbar vertebra, initial encounter for closed fracture (HCC)    Unspecified fracture of t11-T12 vertebra, initial encounter for closed fracture (HCC)    Urinary incontinence    Urinary tract infection    hx of    Urolithiasis    Past Surgical History:  Procedure Laterality Date   APPENDECTOMY     BIOPSY  02/15/2023   Procedure: BIOPSY;  Surgeon: Shaaron Lamar HERO, MD;  Location: [facility];  Service: Endoscopy;;   CARPAL TUNNEL RELEASE  12/27/2006   Right subcutaneous ulnar nerve transfer -- Right open carpal tunnel release   CHOLECYSTECTOMY     CYSTOSCOPY W/ URETERAL STENT PLACEMENT Right 01/30/2018   Procedure: CYSTOSCOPY WITH STENT REPLACEMENT retrograde pylegram;  Surgeon: Cam Morene ORN, MD;  Location: [facility];  Service: Urology;  Laterality: Right;   CYSTOSCOPY WITH URETEROSCOPY AND STENT PLACEMENT Right 03/01/2018   Procedure: CYSTOSCPY/RIGHT URETEROSCOPY HOLMIUM LASER LITHOTRIPSY AND STENT  EXCHANGE;  Surgeon: Cam Morene ORN, MD;  Location: [facility];  Service: Urology;  Laterality: Right;   ESOPHAGOGASTRODUODENOSCOPY (EGD) WITH PROPOFOL  N/A 02/15/2023   Procedure: ESOPHAGOGASTRODUODENOSCOPY (EGD) WITH PROPOFOL ;  Surgeon: Shaaron Lamar HERO, MD;  Location: [facility];  Service: Endoscopy;  Laterality: N/A;  2:15 pm, asa 3/4   FOOT SURGERY     HIP ARTHROPLASTY Left 08/27/2020   Procedure: ARTHROPLASTY BIPOLAR HIP (HEMIARTHROPLASTY);  Surgeon: Onesimo Oneil LABOR, MD;  Location: [facility];  Service: Orthopedics;  Laterality: Left;   HOLMIUM LASER APPLICATION Right 03/01/2018   Procedure: HOLMIUM LASER APPLICATION;  Surgeon: Cam Morene ORN, MD;  Location: [facility];  Service: Urology;  Laterality: Right;   JOINT REPLACEMENT     BTKR, RTHR   MALONEY DILATION N/A 02/15/2023    Procedure: MALONEY DILATION;  Surgeon: Shaaron Lamar HERO, MD;  Location: [facility];  Service: Endoscopy;  Laterality: N/A;   TOTAL KNEE ARTHROPLASTY  08/21/2007   Right total knee replacement   Current Outpatient Medications on File Prior to Visit  Medication Sig Dispense Refill   albuterol  (VENTOLIN  HFA) 108 (90 Base) MCG/ACT inhaler Inhale 2 puffs into the lungs every 4 (four) hours as needed for wheezing or shortness of breath. 8 g 2   aspirin  EC 81 MG tablet Take 1 tablet (81 mg total) by mouth daily.     atenolol  (TENORMIN ) 25 MG tablet TAKE 1/2 TABLET BY MOUTH DAILY 45 tablet 0   Calcium  Carbonate-Vitamin D3 600-400 MG-UNIT TABS Take 1 tablet by mouth daily.     cephALEXin  (KEFLEX ) 500 MG capsule Take 1 capsule (500 mg total) by mouth 3 (three) times daily. 21 capsule 0   clotrimazole -betamethasone  (LOTRISONE ) cream Apply topically daily. 30 g 1   CRANBERRY PO Take 1 capsule by mouth daily at 12 noon.     DULoxetine  (CYMBALTA ) 30 MG capsule TAKE 1 CAPSULE BY MOUTH DAILY 90 capsule 11   DULoxetine  (CYMBALTA ) 60 MG capsule TAKE 1 CAPSULE BY MOUTH DAILY *IN ADDITION TO CYMBALTA  30 MG TO EQUAL A TOTAL DOSE OF 90 MG PER DAY* 90 capsule 11   ezetimibe  (ZETIA ) 10 MG tablet TAKE 1 TABLET (10 MG TOTAL) BY MOUTH DAILY. 90 tablet 2   gabapentin  (NEURONTIN ) 800 MG tablet TAKE 1 TABLET BY MOUTH 3 TIMES DAILY 90 tablet 10   ketoconazole  (NIZORAL ) 2 % cream Apply 1 Application topically 2 (two) times daily. (Patient taking differently: Apply 1 Application topically 2 (two) times daily as needed for irritation.) 15 g 6   meloxicam  (MOBIC ) 7.5 MG tablet Take 1 tablet (7.5 mg total) by mouth daily as needed for pain. 30 tablet 1   pantoprazole  (PROTONIX ) 40 MG tablet TAKE 1 TABLET BY MOUTH ONCE DAILY 90 tablet 11   predniSONE  (DELTASONE ) 20 MG tablet 3 tabs poqday 1-2, 2 tabs poqday 3-4, 1 tab poqday 5-6 12 tablet 0   rosuvastatin  (CRESTOR ) 40 MG tablet TAKE 1 TABLET BY MOUTH IN THE EVENING 90 tablet 11    tiZANidine  (ZANAFLEX ) 4 MG tablet Take 1 tablet (4 mg total) by mouth every 6 (six) hours as needed (headache). 30 tablet 0   No current facility-administered medications on file prior to visit.   Allergies  Allergen Reactions   Sulfonamide Derivatives Hives and Rash   Social History   Socioeconomic History   Marital status: Divorced    Spouse name: Not on file   Number of children: 6   Years of education: 8th grade   Highest education  level: 8th grade  Occupational History   Occupation: Retired  Tobacco Use   Smoking status: Never   Smokeless tobacco: Never  Vaping Use   Vaping status: Never Used  Substance and Sexual Activity   Alcohol use: No   Drug use: No   Sexual activity: Not Currently  Other Topics Concern   Not on file  Social History Narrative   Lives at home with 2 sons   She has four living children.   Right-handed.   Two cups caffeine per day.   Social Drivers of Corporate Investment Banker Strain: Low Risk  (03/12/2024)   Overall Financial Resource Strain (CARDIA)    Difficulty of Paying Living Expenses: Not hard at all  Food Insecurity: No Food Insecurity (03/12/2024)   Hunger Vital Sign    Worried About Running Out of Food in the Last Year: Never true    Ran Out of Food in the Last Year: Never true  Transportation Needs: No Transportation Needs (03/12/2024)   PRAPARE - Administrator, Civil Service (Medical): No    Lack of Transportation (Non-Medical): No  Physical Activity: Inactive (03/12/2024)   Exercise Vital Sign    Days of Exercise per Week: 0 days    Minutes of Exercise per Session: Not on file  Stress: No Stress Concern Present (03/12/2024)   [employer] of Occupational Health - Occupational Stress Questionnaire    Feeling of Stress: Only a little  Social Connections: Socially Isolated (03/12/2024)   Social Connection and Isolation Panel    Frequency of Communication with Friends and Family: Once a week    Frequency  of Social Gatherings with Friends and Family: Once a week    Attends Religious Services: Never    Database Administrator or Organizations: No    Attends Engineer, Structural: Not on file    Marital Status: Divorced  Intimate Partner Violence: Patient Unable To Answer (08/24/2023)   Humiliation, Afraid, Rape, and Kick questionnaire    Fear of Current or Ex-Partner: Patient unable to answer    Emotionally Abused: Patient unable to answer    Physically Abused: Patient unable to answer    Sexually Abused: Patient unable to answer     Review of Systems  Neurological:  Positive for headaches.  All other systems reviewed and are negative.      Objective:   Physical Exam Constitutional:      General: She is not in acute distress.    Appearance: Normal appearance. She is normal weight. She is not ill-appearing, toxic-appearing or diaphoretic.  Cardiovascular:     Rate and Rhythm: Normal rate. Rhythm irregular.     Heart sounds: Normal heart sounds. No murmur heard.    No gallop.  Pulmonary:     Effort: Pulmonary effort is normal. No tachypnea or respiratory distress.     Breath sounds: No wheezing or rhonchi.  Chest:     Chest wall: No tenderness or edema.  Abdominal:     General: There is no distension.     Tenderness: There is no abdominal tenderness. There is no guarding or rebound.  Musculoskeletal:     Right lower leg: No edema.     Left lower leg: No edema.  Neurological:     Mental Status: She is alert.   Patient has faint bibasilar crackles that are due to atelectasis        Assessment & Plan:  Need for vaccination - Plan: Flu vaccine HIGH  DOSE PF(Fluzone Trivalent)  New persistent daily headache Headache has been present now for 2 months.  Given her age, I feel that we need to get an MRI of the brain to evaluate for space-occupying lesion or other potential cause of headache.  It is very unusual for a person of her age developing new daily headache.   Meanwhile we will start Topamax 25 mg p.o. nightly.  Increase to 25 mg twice daily in 1 week.  Uptitrate by 25 mg weekly until she is on 50 mg twice weekly to try to stop the potential Need while awaiting the results of the MRI.

## 2024-03-13 ENCOUNTER — Other Ambulatory Visit: Payer: Self-pay | Admitting: Family Medicine

## 2024-03-13 DIAGNOSIS — R8271 Bacteriuria: Secondary | ICD-10-CM

## 2024-03-26 ENCOUNTER — Other Ambulatory Visit

## 2024-03-27 ENCOUNTER — Other Ambulatory Visit: Payer: Self-pay

## 2024-03-27 ENCOUNTER — Encounter (HOSPITAL_BASED_OUTPATIENT_CLINIC_OR_DEPARTMENT_OTHER): Payer: Self-pay

## 2024-03-27 ENCOUNTER — Emergency Department (HOSPITAL_BASED_OUTPATIENT_CLINIC_OR_DEPARTMENT_OTHER)
Admission: EM | Admit: 2024-03-27 | Discharge: 2024-03-27 | Disposition: A | Discharge status: Home / Self Care | Attending: Emergency Medicine | Admitting: Emergency Medicine

## 2024-03-27 DIAGNOSIS — N3000 Acute cystitis without hematuria: Secondary | ICD-10-CM | POA: Insufficient documentation

## 2024-03-27 DIAGNOSIS — Z79899 Other long term (current) drug therapy: Secondary | ICD-10-CM | POA: Diagnosis not present

## 2024-03-27 DIAGNOSIS — I10 Essential (primary) hypertension: Secondary | ICD-10-CM | POA: Diagnosis not present

## 2024-03-27 DIAGNOSIS — Z7982 Long term (current) use of aspirin: Secondary | ICD-10-CM | POA: Diagnosis not present

## 2024-03-27 DIAGNOSIS — R350 Frequency of micturition: Secondary | ICD-10-CM | POA: Diagnosis present

## 2024-03-27 LAB — CBC WITH DIFFERENTIAL/PLATELET
Abs Immature Granulocytes: 0.01 K/uL (ref 0.00–0.07)
Basophils Absolute: 0.1 K/uL (ref 0.0–0.1)
Basophils Relative: 2 %
Eosinophils Absolute: 0.2 K/uL (ref 0.0–0.5)
Eosinophils Relative: 3 %
HCT: 39.3 % (ref 36.0–46.0)
Hemoglobin: 12.6 g/dL (ref 12.0–15.0)
Immature Granulocytes: 0 %
Lymphocytes Relative: 38 %
Lymphs Abs: 2.5 K/uL (ref 0.7–4.0)
MCH: 29.3 pg (ref 26.0–34.0)
MCHC: 32.1 g/dL (ref 30.0–36.0)
MCV: 91.4 fL (ref 80.0–100.0)
Monocytes Absolute: 0.4 K/uL (ref 0.1–1.0)
Monocytes Relative: 6 %
Neutro Abs: 3.4 K/uL (ref 1.7–7.7)
Neutrophils Relative %: 51 %
Platelets: 136 K/uL — ABNORMAL LOW (ref 150–400)
RBC: 4.3 MIL/uL (ref 3.87–5.11)
RDW: 14 % (ref 11.5–15.5)
WBC: 6.6 K/uL (ref 4.0–10.5)
nRBC: 0 % (ref 0.0–0.2)

## 2024-03-27 LAB — URINALYSIS, COMPLETE (UACMP) WITH MICROSCOPIC
Bilirubin Urine: NEGATIVE
Glucose, UA: NEGATIVE mg/dL
Hgb urine dipstick: NEGATIVE
Ketones, ur: NEGATIVE mg/dL
Nitrite: NEGATIVE
Protein, ur: NEGATIVE mg/dL
Specific Gravity, Urine: 1.015 (ref 1.005–1.030)
pH: 6 (ref 5.0–8.0)

## 2024-03-27 LAB — BASIC METABOLIC PANEL WITH GFR
Anion gap: 9 (ref 5–15)
BUN: 21 mg/dL (ref 8–23)
CO2: 29 mmol/L (ref 22–32)
Calcium: 9 mg/dL (ref 8.9–10.3)
Chloride: 101 mmol/L (ref 98–111)
Creatinine, Ser: 0.98 mg/dL (ref 0.44–1.00)
GFR, Estimated: 56 mL/min — ABNORMAL LOW (ref >60)
Glucose, Bld: 127 mg/dL — ABNORMAL HIGH (ref 70–99)
Potassium: 4.2 mmol/L (ref 3.5–5.1)
Sodium: 138 mmol/L (ref 135–145)

## 2024-03-27 MED ORDER — CEPHALEXIN 500 MG PO CAPS: 500.0000 mg | ORAL_CAPSULE | Freq: Two times a day (BID) | ORAL | 0 refills | Status: DC

## 2024-03-27 MED ORDER — CEPHALEXIN 250 MG PO CAPS
500.0000 mg | ORAL_CAPSULE | Freq: Once | ORAL | Status: AC
Start: 2024-03-27 — End: 2024-03-27
  Administered 2024-03-27: 500 mg via ORAL
  Filled 2024-03-27: qty 2

## 2024-03-27 NOTE — Discharge Instructions (Addendum)
 Take the entire course of the antibiotics prescribed, your next dose tomorrow morning.  Make sure you are drinking plenty of fluids.  Get rechecked immediately if you develop any new symptoms including nausea or vomiting, fevers or worsening rather than improving symptoms.

## 2024-03-27 NOTE — ED Triage Notes (Signed)
 Pt w son, c/o shakes & really bad smell down there, advises hx of UTI w similar symptoms. Pt advises a little burning w urination, denies abd/ back pain. Son states this was just out of caution to make sure.

## 2024-03-27 NOTE — ED Provider Notes (Signed)
 [place] EMERGENCY DEPARTMENT AT [facility] Provider Note   CSN: [id number] Arrival date & time: 03/27/24  1749     Patient presents with: No chief complaint on file.   [name] is a 89 y.o. female with a history including hypertension, atrial fibrillation, RA, history of prior UTIs and pyelonephritis presenting for evaluation of dysuria described as increased urinary frequency along with burning with urination, also noticing a stronger than normal urine smell which started today.  She has had no fevers or chills, she denies nausea or vomiting, son reports that she requires assistance with ambulation and he is her primary caregiver.  While walking to the bathroom prior to arrival she was more tremulous than normal.  No documented fevers.  She denies back or flank pain.   The history is provided by the patient.       Prior to Admission medications   Medication Sig Start Date End Date Taking? Authorizing Provider  cephALEXin  (KEFLEX ) 500 MG capsule Take 1 capsule (500 mg total) by mouth 2 (two) times daily for 7 days. 03/27/24 04/03/24 Yes Keagen Heinlen, Mliss, PA-C  albuterol  (VENTOLIN  HFA) 108 (90 Base) MCG/ACT inhaler Inhale 2 puffs into the lungs every 4 (four) hours as needed for wheezing or shortness of breath. 08/27/23   Amin, Ankit C, MD  aspirin  EC 81 MG tablet Take 1 tablet (81 mg total) by mouth daily. 04/03/23   Vann, Jessica U, DO  atenolol  (TENORMIN ) 25 MG tablet TAKE 1/2 TABLET BY MOUTH DAILY 03/15/24   Duanne Butler DASEN, MD  Calcium  Carbonate-Vitamin D3 600-400 MG-UNIT TABS Take 1 tablet by mouth daily. 02/04/20   [provider]  clotrimazole -betamethasone  (LOTRISONE ) cream Apply topically daily. 01/18/24   Duanne Butler DASEN, MD  CRANBERRY PO Take 1 capsule by mouth daily at 12 noon.    [provider]  DULoxetine  (CYMBALTA ) 30 MG capsule TAKE 1 CAPSULE BY MOUTH DAILY 02/14/24   Duanne Butler DASEN, MD  DULoxetine  (CYMBALTA ) 60 MG capsule TAKE 1  CAPSULE BY MOUTH DAILY *IN ADDITION TO CYMBALTA  30 MG TO EQUAL A TOTAL DOSE OF 90 MG PER DAY* 02/14/24   Duanne Butler DASEN, MD  ezetimibe  (ZETIA ) 10 MG tablet TAKE 1 TABLET (10 MG TOTAL) BY MOUTH DAILY. 09/18/23   Duanne Butler DASEN, MD  gabapentin  (NEURONTIN ) 800 MG tablet TAKE 1 TABLET BY MOUTH 3 TIMES DAILY 03/15/24   Duanne Butler DASEN, MD  ketoconazole  (NIZORAL ) 2 % cream Apply 1 Application topically 2 (two) times daily. Patient taking differently: Apply 1 Application topically 2 (two) times daily as needed for irritation. 06/13/23   Hyatt, Max T, DPM  meloxicam  (MOBIC ) 7.5 MG tablet Take 1 tablet (7.5 mg total) by mouth daily as needed for pain. 11/10/23   Duanne Butler DASEN, MD  pantoprazole  (PROTONIX ) 40 MG tablet TAKE 1 TABLET BY MOUTH ONCE DAILY 10/17/23   Duanne Butler DASEN, MD  predniSONE  (DELTASONE ) 20 MG tablet 3 tabs poqday 1-2, 2 tabs poqday 3-4, 1 tab poqday 5-6 01/26/24   Duanne Butler DASEN, MD  rosuvastatin  (CRESTOR ) 40 MG tablet TAKE 1 TABLET BY MOUTH IN THE EVENING 10/17/23   Duanne Butler DASEN, MD  tiZANidine  (ZANAFLEX ) 4 MG tablet Take 1 tablet (4 mg total) by mouth every 6 (six) hours as needed (headache). 01/26/24   Duanne Butler DASEN, MD  topiramate  (TOPAMAX ) 25 MG tablet Take 2 tablets (50 mg total) by mouth 2 (two) times daily. 03/12/24   Duanne Butler DASEN, MD  Allergies: Sulfonamide derivatives    Review of Systems  Constitutional:  Negative for fever.  HENT: Negative.  Negative for congestion.   Eyes: Negative.   Respiratory:  Negative for chest tightness and shortness of breath.   Cardiovascular:  Negative for chest pain.  Gastrointestinal:  Negative for abdominal pain, nausea and vomiting.  Genitourinary:  Positive for dysuria and frequency.  Musculoskeletal:  Negative for arthralgias, joint swelling and neck pain.  Skin: Negative.  Negative for rash and wound.  Neurological:  Positive for weakness. Negative for dizziness, light-headedness, numbness and headaches.   Psychiatric/Behavioral: Negative.      Updated Vital Signs BP 106/66   Pulse 88   Temp 98.2 F (36.8 C)   Resp 16   SpO2 94%   Physical Exam Vitals and nursing note reviewed.  Constitutional:      Appearance: She is well-developed.  HENT:     Head: Normocephalic and atraumatic.  Eyes:     Conjunctiva/sclera: Conjunctivae normal.  Cardiovascular:     Rate and Rhythm: Normal rate and regular rhythm.     Heart sounds: Normal heart sounds.  Pulmonary:     Effort: Pulmonary effort is normal.     Breath sounds: Normal breath sounds. No wheezing.  Abdominal:     General: Bowel sounds are normal.     Palpations: Abdomen is soft.     Tenderness: There is no abdominal tenderness. There is no right CVA tenderness, left CVA tenderness or guarding.  Musculoskeletal:        General: Normal range of motion.     Cervical back: Normal range of motion.  Skin:    General: Skin is warm and dry.  Neurological:     Mental Status: She is alert.     (all labs ordered are listed, but only abnormal results are displayed) Labs Reviewed  URINALYSIS, COMPLETE (UACMP) WITH MICROSCOPIC - Abnormal; Notable for the following components:      Result Value   Leukocytes,Ua MODERATE (*)    Bacteria, UA MANY (*)    All other components within normal limits  BASIC METABOLIC PANEL WITH GFR - Abnormal; Notable for the following components:   Glucose, Bld 127 (*)    GFR, Estimated 56 (*)    All other components within normal limits  CBC WITH DIFFERENTIAL/PLATELET - Abnormal; Notable for the following components:   Platelets 136 (*)    All other components within normal limits  URINE CULTURE    EKG: None  Radiology: No results found.   Procedures   Medications Ordered in the ED  cephALEXin  (KEFLEX ) capsule 500 mg (500 mg Oral Given 03/27/24 1929)                                    Medical Decision Making Pt presenting with dysuria and urgency, frequent history of UTIs.  She has been  afebrile today, son who is her primary caregiver noted she required extra assistance ambulating to the bathroom today, she notes that her urine has been more pungent than normal, all symptoms she has had in the past with prior UTIs.  She has no fevers or chills, no nausea or vomiting, she denies flank and back pain.  Differential diagnosis including UTI, pyelonephritis, kidney stones, although patient does not have this history and has no flank pain.  Labs as outlined below, simple acute cystitis is suspected.  She is started on Keflex  with  return precautions for any worsening symptoms.  She was also advised a repeat urinalysis through her PCP once antibiotics are completed to ensure her infection has resolved.  Amount and/or Complexity of Data Reviewed Labs: ordered.    Details: Labs reviewed and are reassuring with a normal WBC count at 6.6, her be met is unremarkable, her urinalysis is significant for moderate leukocytes and many bacteria.  Risk Prescription drug management.        Final diagnoses:  Acute cystitis without hematuria    ED Discharge Orders          Ordered    cephALEXin  (KEFLEX ) 500 MG capsule  2 times daily        03/27/24 1929               Erich Kochan, PA-C 03/27/24 2014    Pamella Ozell LABOR, DO 04/03/24 [phone]

## 2024-03-29 ENCOUNTER — Encounter: Payer: Self-pay | Admitting: Family Medicine

## 2024-03-30 LAB — URINE CULTURE: Culture: >=100000 — AB

## 2024-03-31 ENCOUNTER — Telehealth (HOSPITAL_BASED_OUTPATIENT_CLINIC_OR_DEPARTMENT_OTHER): Payer: Self-pay | Admitting: *Deleted

## 2024-03-31 NOTE — Telephone Encounter (Signed)
 Post ED Visit - Positive Culture Follow-up  Culture report reviewed by antimicrobial stewardship pharmacist: [facility] []  Rankin Dee, Pharm.D. []  Venetia Gully, Pharm.D., BCPS AQ-ID []  Garrel Crews, Pharm.D., BCPS []  Almarie Lunger, Pharm.D., BCPS []  [place], [address].D., BCPS, AAHIVP []  Rosaline Bihari, Pharm.D., BCPS, AAHIVP []  Vernell Meier, PharmD, BCPS []  Latanya Hint, PharmD, BCPS []  Donald Medley, PharmD, BCPS []  Rocky Bold, PharmD []  Dorothyann Alert, PharmD, BCPS [x]  Dorn Poot, PharmD  [facility] []  Rosaline Edison, PharmD []  Romona Bliss, PharmD []  Dolphus Roller, PharmD []  Veva Seip, Rph []  Vernell Daunt) Leonce, PharmD []  Eva Allis, PharmD []  Rosaline Millet, PharmD []  Iantha Batch, PharmD []  Arvin Gauss, PharmD []  Wanda Hasting, PharmD []  Ronal Rav, PharmD []  Rocky Slade, PharmD []  Bard Jeans, PharmD   Positive urine culture Treated with Cephalexin , organism sensitive to the same and no further patient follow-up is required at this time.  [name] 03/31/2024, 10:26 AM

## 2024-04-01 ENCOUNTER — Telehealth: Payer: Self-pay | Admitting: Family Medicine

## 2024-04-01 NOTE — Telephone Encounter (Signed)
 Copied from CRM #[id number]. Topic: Clinical - Prescription Issue >> Mar 27, 2024  5:08 PM Zebedee SAUNDERS wrote: Reason for CRM: Pt's son Alwin Ozell [employer] [phone] calling on behalf of pt has a UTI and needs medication sent to [id number] [place] [address] Phone: [phone] Fax: [phone]

## 2024-04-02 ENCOUNTER — Other Ambulatory Visit: Payer: Self-pay | Admitting: Family Medicine

## 2024-04-02 ENCOUNTER — Ambulatory Visit: Admitting: Podiatry

## 2024-04-02 ENCOUNTER — Telehealth: Payer: Self-pay

## 2024-04-02 VITALS — Ht 59.0 in | Wt 165.0 lb

## 2024-04-02 DIAGNOSIS — B351 Tinea unguium: Secondary | ICD-10-CM | POA: Diagnosis not present

## 2024-04-02 DIAGNOSIS — D2372 Other benign neoplasm of skin of left lower limb, including hip: Secondary | ICD-10-CM | POA: Diagnosis not present

## 2024-04-02 DIAGNOSIS — M79676 Pain in unspecified toe(s): Secondary | ICD-10-CM

## 2024-04-02 DIAGNOSIS — E1142 Type 2 diabetes mellitus with diabetic polyneuropathy: Secondary | ICD-10-CM | POA: Diagnosis not present

## 2024-04-02 MED ORDER — GABAPENTIN 100 MG PO CAPS: 200.0000 mg | ORAL_CAPSULE | Freq: Every day | ORAL | 3 refills | Status: AC

## 2024-04-02 MED ORDER — GABAPENTIN 800 MG PO TABS: ORAL_TABLET | ORAL | 3 refills | Status: AC

## 2024-04-02 MED ORDER — CEPHALEXIN 500 MG PO CAPS: 500.0000 mg | ORAL_CAPSULE | Freq: Three times a day (TID) | ORAL | 0 refills | Status: DC

## 2024-04-02 NOTE — Telephone Encounter (Signed)
 Order faxed today. Mjp,lpn  Copied from CRM #[id number]. Topic: Clinical - Medication Question >> Apr 02, 2024  2:41 PM Lonell PEDLAR wrote: Reason for CRM: Patient's son, Ozell, called stating pt needs new rx for XL diapers Pads Wipes Gloves Rx must be sent to [facility] C/b: [phone]

## 2024-04-02 NOTE — Progress Notes (Signed)
 She presents today with her son chief concern of painful elongated toenails.  History of diabetes worsening neuropathy she says.  She states that she has no open sores to deal with.  Objective: Pulses are barely palpable bilateral feet are erythematous hammertoes are noted bilateral toenails are long thick yellow dystrophic with mycotic multiple benign skin lesions no preulcerative lesions noted.  Assessment: Pain in limb secondary to onychomycosis and nail dystrophy.  Plan: Debridement of toenails 1 through 5 bilateral debridement of benign skin lesions bilateral covered service.  I also increased her gabapentin  by 200 mg at bedtime.

## 2024-04-03 ENCOUNTER — Ambulatory Visit (INDEPENDENT_AMBULATORY_CARE_PROVIDER_SITE_OTHER): Admitting: Physician Assistant

## 2024-04-03 ENCOUNTER — Encounter (INDEPENDENT_AMBULATORY_CARE_PROVIDER_SITE_OTHER): Payer: Self-pay | Admitting: Physician Assistant

## 2024-04-03 VITALS — BP 99/65 | HR 78 | Temp 98.3°F | Ht 64.0 in | Wt 165.0 lb

## 2024-04-03 DIAGNOSIS — H6121 Impacted cerumen, right ear: Secondary | ICD-10-CM

## 2024-04-03 DIAGNOSIS — H9193 Unspecified hearing loss, bilateral: Secondary | ICD-10-CM

## 2024-04-03 NOTE — Progress Notes (Signed)
 Dear Dr. Duanne, Here is my assessment for our mutual patient, [name]. Thank you for allowing me the opportunity to care for your patient. Please do not hesitate to contact me should you have any other questions. Sincerely, Chyrl Cohen PA-C  Otolaryngology Clinic Note Referring provider: Dr. Duanne HPI:  [name] is a 89 y.o. female kindly referred by Dr. Duanne   Discussed the use of AI scribe software for clinical note transcription with the patient, who gave verbal consent to proceed.  History of Present Illness   [name] is an 89 year old female who presents with worsening hearing loss, particularly in the right ear. She was referred by her primary care doctor for evaluation of her hearing loss.  She has been experiencing progressive hearing loss over the past few years, with significant difficulty in the right ear. Her current hearing aids, which she has had for four to five years, are not functioning well, particularly the one on the right side, which 'doesn't really do anything'.  She previously received her hearing aids from [facility], but they no longer accept Medicaid or Medicare, prompting her referral to this clinic. There is no history of ear pain or infections. Her daughter notes that she often has to raise her voice for her mother to hear her.  She has a history of using oxygen at home and in the hospital for about seven to eight months.  No ear pain or infections reported. No use of intranasal drugs.           Independent Review of Additional Tests or Records:  none   PMH/Meds/All/SocHx/FamHx/ROS:   Past Medical History:  Diagnosis Date   [name] (abdominal aortic aneurysm) 09/2013   3.6 cm   Acute and chronic respiratory failure with hypoxia (HCC)    Acute delirium    Acute encephalopathy    Acute pyelonephritis    Atrial fibrillation (HCC)    Chronic pain syndrome    Constipation    Dementia (HCC)    Dependent edema    bilateral legs     Depression    Diabetes mellitus    pre-diabetes   Diverticulosis    Diverticulosis    Dysrhythmia    Dysuria    E coli bacteremia    Fall    Gross hematuria    Hydronephrosis with renal and ureteral calculous obstruction    Hyperlipidemia    Hypertension    Hypoxemia    Idiopathic peripheral autonomic neuropathy    Infestation by bed bug    Nephrolithiasis    OSA on CPAP    Osteoarthritis    Osteopenia    T=  -2.1 in hip   Persistent mood (affective) disorder, unspecified    Prediabetes    Rheumatoid arthritis (HCC)    Rheumatoid arthritis(714.0)    S/P carpal tunnel release    Sepsis secondary to UTI [facility])    Spinal stenosis    Unspecified fracture of fifth lumbar vertebra, initial encounter for closed fracture (HCC)    Unspecified fracture of t11-T12 vertebra, initial encounter for closed fracture (HCC)    Urinary incontinence    Urinary tract infection    hx of    Urolithiasis      Past Surgical History:  Procedure Laterality Date   APPENDECTOMY     BIOPSY  02/15/2023   Procedure: BIOPSY;  Surgeon: Shaaron Lamar HERO, MD;  [facility];  Service: Endoscopy;;   CARPAL TUNNEL RELEASE  12/27/2006  Right subcutaneous ulnar nerve transfer -- Right open carpal tunnel release   CHOLECYSTECTOMY     CYSTOSCOPY W/ URETERAL STENT PLACEMENT Right 01/30/2018   Procedure: CYSTOSCOPY WITH STENT REPLACEMENT retrograde pylegram;  Surgeon: Cam Morene ORN, MD;  Location: [facility];  Service: Urology;  Laterality: Right;   CYSTOSCOPY WITH URETEROSCOPY AND STENT PLACEMENT Right 03/01/2018   Procedure: CYSTOSCPY/RIGHT URETEROSCOPY HOLMIUM LASER LITHOTRIPSY AND STENT EXCHANGE;  Surgeon: Cam Morene ORN, MD;  Location: [facility];  Service: Urology;  Laterality: Right;   ESOPHAGOGASTRODUODENOSCOPY (EGD) WITH PROPOFOL  N/A 02/15/2023   Procedure: ESOPHAGOGASTRODUODENOSCOPY (EGD) WITH PROPOFOL ;  Surgeon: Shaaron Lamar HERO, MD;  [facility];  Service: Endoscopy;   Laterality: N/A;  2:15 pm, asa 3/4   FOOT SURGERY     HIP ARTHROPLASTY Left 08/27/2020   Procedure: ARTHROPLASTY BIPOLAR HIP (HEMIARTHROPLASTY);  Surgeon: Onesimo Oneil LABOR, MD;  Location: [facility];  Service: Orthopedics;  Laterality: Left;   HOLMIUM LASER APPLICATION Right 03/01/2018   Procedure: HOLMIUM LASER APPLICATION;  Surgeon: Cam Morene ORN, MD;  Location: [facility];  Service: Urology;  Laterality: Right;   JOINT REPLACEMENT     BTKR, RTHR   MALONEY DILATION N/A 02/15/2023   Procedure: MALONEY DILATION;  Surgeon: Shaaron Lamar HERO, MD;  [facility];  Service: Endoscopy;  Laterality: N/A;   TOTAL KNEE ARTHROPLASTY  08/21/2007   Right total knee replacement    Family History  Problem Relation Age of Onset   Stroke Mother    Stroke Father    Hypertension Sister    Diabetes Brother    Coronary artery disease Son        CABG at age 7   Heart disease Son    Heart attack Son    Hypertension Maternal Aunt    Cancer Daughter    Colon cancer Neg Hx      Social Connections: Socially Isolated (03/12/2024)   Social Connection and Isolation Panel    Frequency of Communication with Friends and Family: Once a week    Frequency of Social Gatherings with Friends and Family: Once a week    Attends Religious Services: Never    Database Administrator or Organizations: No    Attends Engineer, Structural: Not on file    Marital Status: Divorced      Current Outpatient Medications:    albuterol  (VENTOLIN  HFA) 108 (90 Base) MCG/ACT inhaler, Inhale 2 puffs into the lungs every 4 (four) hours as needed for wheezing or shortness of breath., Disp: 8 g, Rfl: 2   aspirin  EC 81 MG tablet, Take 1 tablet (81 mg total) by mouth daily., Disp: , Rfl:    atenolol  (TENORMIN ) 25 MG tablet, TAKE 1/2 TABLET BY MOUTH DAILY, Disp: 45 tablet, Rfl: 11   Calcium  Carbonate-Vitamin D3 600-400 MG-UNIT TABS, Take 1 tablet by mouth daily., Disp: , Rfl:    cephALEXin  (KEFLEX ) 500 MG capsule, Take 1  capsule (500 mg total) by mouth 3 (three) times daily., Disp: 21 capsule, Rfl: 0   clotrimazole -betamethasone  (LOTRISONE ) cream, Apply topically daily., Disp: 30 g, Rfl: 1   CRANBERRY PO, Take 1 capsule by mouth daily at 12 noon., Disp: , Rfl:    DULoxetine  (CYMBALTA ) 30 MG capsule, TAKE 1 CAPSULE BY MOUTH DAILY, Disp: 90 capsule, Rfl: 11   DULoxetine  (CYMBALTA ) 60 MG capsule, TAKE 1 CAPSULE BY MOUTH DAILY *IN ADDITION TO CYMBALTA  30 MG TO EQUAL A TOTAL DOSE OF 90 MG PER DAY*, Disp: 90 capsule, Rfl: 11  ezetimibe  (ZETIA ) 10 MG tablet, TAKE 1 TABLET (10 MG TOTAL) BY MOUTH DAILY., Disp: 90 tablet, Rfl: 2   gabapentin  (NEURONTIN ) 100 MG capsule, Take 2 capsules (200 mg total) by mouth at bedtime., Disp: 90 capsule, Rfl: 3   gabapentin  (NEURONTIN ) 800 MG tablet, Take one 800mg  AM and one 800mg  PM and one 800mg  QHS, Disp: 270 tablet, Rfl: 3   ketoconazole  (NIZORAL ) 2 % cream, Apply 1 Application topically 2 (two) times daily. (Patient taking differently: Apply 1 Application topically 2 (two) times daily as needed for irritation.), Disp: 15 g, Rfl: 6   meloxicam  (MOBIC ) 7.5 MG tablet, Take 1 tablet (7.5 mg total) by mouth daily as needed for pain., Disp: 30 tablet, Rfl: 1   pantoprazole  (PROTONIX ) 40 MG tablet, TAKE 1 TABLET BY MOUTH ONCE DAILY, Disp: 90 tablet, Rfl: 11   predniSONE  (DELTASONE ) 20 MG tablet, 3 tabs poqday 1-2, 2 tabs poqday 3-4, 1 tab poqday 5-6, Disp: 12 tablet, Rfl: 0   rosuvastatin  (CRESTOR ) 40 MG tablet, TAKE 1 TABLET BY MOUTH IN THE EVENING, Disp: 90 tablet, Rfl: 11   tiZANidine  (ZANAFLEX ) 4 MG tablet, Take 1 tablet (4 mg total) by mouth every 6 (six) hours as needed (headache)., Disp: 30 tablet, Rfl: 0   topiramate  (TOPAMAX ) 25 MG tablet, Take 2 tablets (50 mg total) by mouth 2 (two) times daily., Disp: 120 tablet, Rfl: 3   Physical Exam:   BP 99/65   Pulse 78   Temp 98.3 F (36.8 C)   Ht 5' 4 (1.626 m)   Wt 165 lb (74.8 kg)   SpO2 91%   BMI 28.32 kg/m   Pertinent  Findings  CN II-XII grossly intact Right-sided cerumen impaction, left EAC clear, TM intact with well-pneumatized middle ear space Anterior rhinoscopy: Septum with perforation No lesions of oral cavity/oropharynx; dentition edentulous No obviously palpable neck masses/lymphadenopathy/thyromegaly No respiratory distress or stridor        Seprately Identifiable Procedures:  Procedure: bilateral ear microscopy and cerumen removal using microscope (CPT 30789) - Mod 25 Pre-procedure diagnosis: unilateral cerumen impaction right external auditory canal Post-procedure diagnosis: same Indication: bilateral cerumen impaction; given patient's otologic complaints and history as well as for improved and comprehensive examination of external ear and tympanic membrane, bilateral otologic examination using microscope was performed and impacted cerumen removed  Procedure: Patient was placed semi-recumbent. Both ear canals were examined using the microscope with findings above. Cerumen removed from the right external auditory canal using suction and currette with improvement in EAC examination and patency. Left: EAC was patent. TM was intact . Middle ear was aerated. Drainage: none Right: EAC was patent. TM was intact . Middle ear was aerated . Drainage: none Patient tolerated the procedure well.   Impression & Plans:  [name] is a 89 y.o. female with the following   Assessment and Plan    Bilateral hearing loss Chronic bilateral hearing loss, right side more affected due to non-functional hearing aid. Cerumen impaction in right ear noted. - Ordered hearing test to evaluate hearing loss. - Will review hearing test results to determine need for hearing aids. - Advised follow-up with audiologist for hearing aid fitting if hearing test indicates need.        - f/u phone call discussion with audiological results   Thank you for allowing me the opportunity to care for your patient. Please do not  hesitate to contact me should you have any other questions.  Sincerely, Chyrl Cohen PA-C [facility]  Phone: [phone] Fax: [phone]  04/03/2024, 11:48 AM

## 2024-04-04 ENCOUNTER — Ambulatory Visit (INDEPENDENT_AMBULATORY_CARE_PROVIDER_SITE_OTHER): Admitting: Audiology

## 2024-04-04 DIAGNOSIS — H903 Sensorineural hearing loss, bilateral: Secondary | ICD-10-CM | POA: Diagnosis not present

## 2024-04-04 NOTE — Progress Notes (Signed)
  [address] [place], [place] [address] [phone]  Audiological Evaluation    Name: [name]     DOB:   [birth year]      MRN:   [id number]                                                                                     Service Date: 04/04/2024     Accompanied by: son   Patient comes today after Reyes Cohen, PA-C sent a referral for a hearing evaluation due to concerns with hearing loss.    Symptoms Yes Details  Hearing loss  [x]  Patient's son said she struggled hearing more out of the right ear.  Tinnitus  []    Ear pain/ infections/pressure  []    Balance problems  []    Noise exposure history  []    Previous ear surgeries  []    Family history of hearing loss  []    Amplification  [x]  Has a set of Hearing Aids from [facility]  Other  [x]  Wax was removed form her ears yesterday by DOROTHA Cohen, PA-C    Otoscopy: Right ear: Clear external ear canal and notable landmarks visualized on the tympanic membrane. Left ear:  Abnormal eardrum appearance.  Tympanometry: Right ear: Type A - Normal external ear canal volume with normal middle ear pressure and normal tympanic membrane compliance. Findings are consistent with normal middle ear function. Left ear: Type A - Normal external ear canal volume with normal middle ear pressure and normal tympanic membrane compliance. Findings are consistent with normal middle ear function.    Hearing Evaluation The hearing test results were completed under headphones ( inserts for [facility] masking) and results are deemed to be of poor reliability. Test technique:  conventional    Pure tone Audiometry: Right ear- moderate to sloping to profound  predominantly sensorineural hearing loss from 250 Hz - 8000 Hz. Left ear-  Moderately-severe sloping to profound sensorineural hearing loss from 250 Hz - 8000 Hz.  Speech Audiometry: Right ear- Speech Reception Threshold (SRT) was obtained at 70 dBHL. Left ear-Speech Reception  Threshold (SRT) was obtained at 70 dBHL.   Word Recognition Score Tested using NU-6 (recorded) Right ear: 42% was obtained at a presentation level of 85 dBHL (reported MCL) with contralateral masking which is deemed as  poor. Left ear: 20% was obtained at a presentation level of 85 dBHL (reported MCL) without contralateral masking which is deemed as  poor.   Hearing aid check:  Hearing aids:  right side tubing changed and hearing aids cleaned as a courtesy.  Impression: There is a significant difference in pure-tone thresholds between ears. There is a significant difference in word recognition scores , worse of the left ear.     Recommendations: Return for a hearing evaluation if concerns with hearing changes arise or per MD recommendation. Consider a communication needs assessment for amplification after medical clearance is obtained, if needed. Ideally patient;s thresholds should be rechecked prior to hearing aid fitting due to patients reliability (patient reported to be very tired).   Shawnika Pepin MARIE LEROUX-MARTINEZ, AUD

## 2024-04-08 ENCOUNTER — Telehealth: Payer: Self-pay

## 2024-04-08 NOTE — Telephone Encounter (Signed)
 Copied from CRM #[id number]. Topic: General - Other >> Apr 08, 2024 12:46 PM Antony RAMAN wrote: Reason for CRM: [facility] needs new prescription for XL diapers

## 2024-04-15 ENCOUNTER — Ambulatory Visit
Admission: RE | Admit: 2024-04-15 | Discharge: 2024-04-15 | Disposition: A | Source: Ambulatory Visit | Attending: Family Medicine | Admitting: Family Medicine

## 2024-04-15 DIAGNOSIS — G4452 New daily persistent headache (NDPH): Secondary | ICD-10-CM

## 2024-04-15 MED ORDER — GADOPICLENOL 0.5 MMOL/ML IV SOLN
8.0000 mL | Freq: Once | INTRAVENOUS | Status: AC | PRN
  Administered 2024-04-15: 17:00:00 8 mL via INTRAVENOUS

## 2024-04-22 ENCOUNTER — Encounter: Payer: Self-pay | Admitting: Audiology

## 2024-04-22 ENCOUNTER — Ambulatory Visit: Payer: Self-pay | Admitting: Family Medicine

## 2024-04-23 ENCOUNTER — Telehealth (INDEPENDENT_AMBULATORY_CARE_PROVIDER_SITE_OTHER): Payer: Self-pay | Admitting: Physician Assistant

## 2024-04-23 NOTE — Telephone Encounter (Signed)
 Left voicemail to discuss audiological results

## 2024-04-29 ENCOUNTER — Telehealth: Payer: Self-pay

## 2024-04-29 NOTE — Telephone Encounter (Signed)
 Copied from CRM #[id number]. Topic: Clinical - Medical Advice >> Apr 29, 2024  4:29 PM Tobias L wrote: Reason for CRM: Patient's son inquiring if okay to give patient cephALEXin  (KEFLEX ) 500 MG capsule, did not have to use it when prescribed by Dr. Duanne. Patient was taking a different antibiotic that was given to her at hospital. Son concerned of possible UTI, patient shaking, no burning or other symptoms.   Best callback: [phone]

## 2024-04-30 ENCOUNTER — Ambulatory Visit: Payer: Self-pay

## 2024-04-30 NOTE — Telephone Encounter (Signed)
 FYI Only or Action Required?: FYI only for provider: appointment scheduled on 05/03/24.  Patient was last seen in primary care on 03/12/2024 by Duanne Butler DASEN, MD.  Called Nurse Triage reporting Leg Pain.  Symptoms began chronic, worsened 1-2 weeks ago.  Interventions attempted: OTC medications: Tylenol  and Other: elevation.  Symptoms are: gradually worsening.  Triage Disposition: See Within 2 Weeks in Office (overriding See PCP When Office is Open (Within 3 Days))  Patient/caregiver understands and will follow disposition?: Yes                                  1. ONSET: When did the pain start?      Worsened 1-2 weeks ago, chronic issue 2. LOCATION: Where is the pain located?      Mainly left knee area 3. PAIN: How bad is the pain?    (Scale 1-10; or mild, moderate, severe)     Son estimates pain to be a 7-8 when it is at its worst 4. WORK OR EXERCISE: Has there been any recent work or exercise that involved this part of the body?      Denies 5. CAUSE: What do you think is causing the leg pain?     Unsure 6. OTHER SYMPTOMS: Do you have any other symptoms? (e.g., chest pain, back pain, breathing difficulty, swelling, rash, fever, numbness, weakness)     Son reports patient is still able to ambulate with a walker, but son reports patient is more unstable than normal, bilateral feet swelling at baseline Denies discoloration/redness, denies fever, denies numbness, denies chest pain, denies difficulty breathing Son denies history of blood clots    This RN spoke to patient's son, Ozell (on [place]). This RN advised in-person evaluation. This RN scheduled patient for first available appointment in PCP office (with alternate provider).   Copied from CRM [id number]. Topic: Clinical - Red Word Triage >> Apr 30, 2024  8:24 AM Willma SAUNDERS wrote: Red Word that prompted transfer to Nurse Triage: Patient has been experiencing leg pain, mostly in her left  but pain in both, has gotten to the point where she is having difficulty walking. Has gotten worse in the last week. Reason for Disposition  [1] MODERATE pain (e.g., interferes with normal activities, limping) AND [2] present > 3 days  Protocols used: Leg Pain-A-AH

## 2024-05-01 ENCOUNTER — Ambulatory Visit

## 2024-05-01 VITALS — Ht 64.0 in | Wt 165.0 lb

## 2024-05-01 DIAGNOSIS — Z0000 Encounter for general adult medical examination without abnormal findings: Secondary | ICD-10-CM

## 2024-05-01 NOTE — Patient Instructions (Addendum)
 Ms. Barrantes,  Thank you for taking the time for your Medicare Wellness Visit. I appreciate your continued commitment to your health goals. Please review the care plan we discussed, and feel free to reach out if I can assist you further.  Please note that Annual Wellness Visits do not include a physical exam. Some assessments may be limited, especially if the visit was conducted virtually. If needed, we may recommend an in-person follow-up with your provider.  Ongoing Care Seeing your primary care provider every 3 to 6 months helps us  monitor your health and provide consistent, personalized care.   Referrals If a referral was made during today's visit and you haven't received any updates within two weeks, please contact the referred provider directly to check on the status.  Recommended Screenings:  Health Maintenance  Topic Date Due   COVID-19 Vaccine (1) Never done   DTaP/Tdap/Td vaccine (1 - Tdap) Never done   Zoster (Shingles) Vaccine (1 of 2) 12/31/1952   Medicare Annual Wellness Visit  05/01/2025   Pneumococcal Vaccine for age over 35  Completed   Flu Shot  Completed   Osteoporosis screening with Bone Density Scan  Completed   Meningitis B Vaccine  Aged Out       05/01/2024   11:23 AM  Advanced Directives  Does Patient Have a Medical Advance Directive? Yes  Does patient want to make changes to medical advance directive? Yes (MAU/Ambulatory/Procedural Areas - Information given)   Information on Advanced Care Planning can be found at [place]  Secret[facility] of [facility] Advance Health Care Directives Advance Health Care Directives ([email or web address]   Vision: Annual vision screenings are recommended for early detection of glaucoma, cataracts, and diabetic retinopathy. These exams can also reveal signs of chronic conditions such as diabetes and high blood pressure.  Dental: Annual dental screenings help detect early signs of oral cancer, gum disease, and other conditions linked to overall  health, including heart disease and diabetes.  Please see the attached documents for additional preventive care recommendations.

## 2024-05-01 NOTE — Progress Notes (Signed)
 "  Chief Complaint  Patient presents with   Medicare Wellness     Subjective:   [name] is a 89 y.o. female who presents for a Medicare Annual Wellness Visit.  Visit info / Clinical Intake: Medicare Wellness Visit Type:: Subsequent Annual Wellness Visit Persons participating in visit and providing information:: caregiver (with patient present) Medicare Wellness Visit Mode:: Telephone If telephone:: video declined Since this visit was completed virtually, some vitals may be partially provided or unavailable. Missing vitals are due to the limitations of the virtual format.: Documented vitals are patient reported If Telephone or Video please confirm:: I connected with patient using audio/video enable telemedicine. I verified patient identity with two identifiers, discussed telehealth limitations, and patient agreed to proceed. Patient Location:: home Provider Location:: office Interpreter Needed?: No Pre-visit prep was completed: yes AWV questionnaire completed by patient prior to visit?: no Living arrangements:: with family/others Patient's Overall Health Status Rating: (!) fair Typical amount of pain: some Does pain affect daily life?: (!) yes Are you currently prescribed opioids?: no  Dietary Habits and Nutritional Risks How many meals a day?: 3 Eats fruit and vegetables daily?: yes Most meals are obtained by: having others provide food In the last 2 weeks, have you had any of the following?: none Diabetic:: no  Functional Status Activities of Daily Living (to include ambulation/medication): (!) Needs Assist Feeding: Independent Dressing/Grooming: Needs assistance Bathing: Needs assistance Toileting: Needs assistance Transfer: Needs assistance Ambulation: Needs Assistance Home Assistive Devices/Equipment: Walker (specify Type) Medication Administration: Needs assistance (comment) Is this a change from baseline?: Pre-admission baseline Home Management (perform basic  housework or laundry): Needs assistance (comment) Manage your own finances?: (!) no Primary transportation is: family / friends Concerns about vision?: no *vision screening is required for WTM* Concerns about hearing?: (!) yes Uses hearing aids?: no  Fall Screening Falls in the past year?: 1 Number of falls in past year: 0 Was there an injury with Fall?: 1 Fall Risk Category Calculator: 2 Patient Fall Risk Level: Moderate Fall Risk  Fall Risk Patient at Risk for Falls Due to: Impaired balance/gait; History of fall(s); Impaired mobility Fall risk Follow up: Falls evaluation completed; Education provided; Falls prevention discussed  Home and Transportation Safety: All rugs have non-skid backing?: yes All stairs or steps have railings?: yes Grab bars in the bathtub or shower?: yes Have non-skid surface in bathtub or shower?: yes Good home lighting?: yes Regular seat belt use?: yes Hospital stays in the last year:: (!) yes How many hospital stays:: 2 Reason: Generalized Weakness and UTI  Cognitive Assessment Difficulty concentrating, remembering, or making decisions? : yes Will 6CIT or Mini Cog be Completed: no (would like to do in office at future office visit) 6CIT or Mini Cog Declined: patient declined  Advance Directives (For Healthcare) Does Patient Have a Medical Advance Directive?: Yes Does patient want to make changes to medical advance directive?: Yes (MAU/Ambulatory/Procedural Areas - Information given) Type of Advance Directive: Healthcare Power of Attorney Copy of Healthcare Power of Attorney in Chart?: No - copy requested Healthcare Power of Attorney Requested and Now in Chart: Copy in chart Copy of Living Will in Chart?: No - copy requested Would patient like information on creating a medical advance directive?: No - Patient declined  Reviewed/Updated  Reviewed/Updated: Reviewed All (Medical, Surgical, Family, Medications, Allergies, Care Teams, Patient  Goals)    Allergies (verified) Sulfonamide derivatives   Current Medications (verified) Outpatient Encounter Medications as of 05/01/2024  Medication Sig   albuterol  (VENTOLIN   HFA) 108 (90 Base) MCG/ACT inhaler Inhale 2 puffs into the lungs every 4 (four) hours as needed for wheezing or shortness of breath.   aspirin  EC 81 MG tablet Take 1 tablet (81 mg total) by mouth daily.   atenolol  (TENORMIN ) 25 MG tablet TAKE 1/2 TABLET BY MOUTH DAILY   Calcium  Carbonate-Vitamin D3 600-400 MG-UNIT TABS Take 1 tablet by mouth daily.   clotrimazole -betamethasone  (LOTRISONE ) cream Apply topically daily.   CRANBERRY PO Take 1 capsule by mouth daily at 12 noon.   DULoxetine  (CYMBALTA ) 30 MG capsule TAKE 1 CAPSULE BY MOUTH DAILY   DULoxetine  (CYMBALTA ) 60 MG capsule TAKE 1 CAPSULE BY MOUTH DAILY *IN ADDITION TO CYMBALTA  30 MG TO EQUAL A TOTAL DOSE OF 90 MG PER DAY*   ezetimibe  (ZETIA ) 10 MG tablet TAKE 1 TABLET (10 MG TOTAL) BY MOUTH DAILY.   gabapentin  (NEURONTIN ) 100 MG capsule Take 2 capsules (200 mg total) by mouth at bedtime.   gabapentin  (NEURONTIN ) 800 MG tablet Take one 800mg  AM and one 800mg  PM and one 800mg  QHS   ketoconazole  (NIZORAL ) 2 % cream Apply 1 Application topically 2 (two) times daily. (Patient taking differently: Apply 1 Application topically 2 (two) times daily as needed for irritation.)   meloxicam  (MOBIC ) 7.5 MG tablet Take 1 tablet (7.5 mg total) by mouth daily as needed for pain.   pantoprazole  (PROTONIX ) 40 MG tablet TAKE 1 TABLET BY MOUTH ONCE DAILY   rosuvastatin  (CRESTOR ) 40 MG tablet TAKE 1 TABLET BY MOUTH IN THE EVENING   tiZANidine  (ZANAFLEX ) 4 MG tablet Take 1 tablet (4 mg total) by mouth every 6 (six) hours as needed (headache).   topiramate  (TOPAMAX ) 25 MG tablet Take 2 tablets (50 mg total) by mouth 2 (two) times daily.   [DISCONTINUED] cephALEXin  (KEFLEX ) 500 MG capsule Take 1 capsule (500 mg total) by mouth 3 (three) times daily.   [DISCONTINUED] predniSONE   (DELTASONE ) 20 MG tablet 3 tabs poqday 1-2, 2 tabs poqday 3-4, 1 tab poqday 5-6   No facility-administered encounter medications on file as of 05/01/2024.    History: Past Medical History:  Diagnosis Date   [name] (abdominal aortic aneurysm) 09/2013   3.6 cm   Acute and chronic respiratory failure with hypoxia (HCC)    Acute delirium    Acute encephalopathy    Acute pyelonephritis    Atrial fibrillation (HCC)    Chronic pain syndrome    Constipation    Dementia (HCC)    Dependent edema    bilateral legs    Depression    Diabetes mellitus    pre-diabetes   Diverticulosis    Diverticulosis    Dysrhythmia    Dysuria    E coli bacteremia    Fall    Gross hematuria    Hydronephrosis with renal and ureteral calculous obstruction    Hyperlipidemia    Hypertension    Hypoxemia    Idiopathic peripheral autonomic neuropathy    Infestation by bed bug    Nephrolithiasis    OSA on CPAP    Osteoarthritis    Osteopenia    T=  -2.1 in hip   Persistent mood (affective) disorder, unspecified    Prediabetes    Rheumatoid arthritis (HCC)    Rheumatoid arthritis(714.0)    S/P carpal tunnel release    Sepsis secondary to UTI [facility])    Spinal stenosis    Unspecified fracture of fifth lumbar vertebra, initial encounter for closed fracture (HCC)    Unspecified fracture of t11-T12  vertebra, initial encounter for closed fracture [facility])    Urinary incontinence    Urinary tract infection    hx of    Urolithiasis    Past Surgical History:  Procedure Laterality Date   APPENDECTOMY     BIOPSY  02/15/2023   Procedure: BIOPSY;  Surgeon: Shaaron Lamar HERO, MD;  [facility];  Service: Endoscopy;;   CARPAL TUNNEL RELEASE  12/27/2006   Right subcutaneous ulnar nerve transfer -- Right open carpal tunnel release   CHOLECYSTECTOMY     CYSTOSCOPY W/ URETERAL STENT PLACEMENT Right 01/30/2018   Procedure: CYSTOSCOPY WITH STENT REPLACEMENT retrograde pylegram;  Surgeon: Cam Morene ORN,  MD;  [facility];  Service: Urology;  Laterality: Right;   CYSTOSCOPY WITH URETEROSCOPY AND STENT PLACEMENT Right 03/01/2018   Procedure: CYSTOSCPY/RIGHT URETEROSCOPY HOLMIUM LASER LITHOTRIPSY AND STENT EXCHANGE;  Surgeon: Cam Morene ORN, MD;  [facility];  Service: Urology;  Laterality: Right;   ESOPHAGOGASTRODUODENOSCOPY (EGD) WITH PROPOFOL  N/A 02/15/2023   Procedure: ESOPHAGOGASTRODUODENOSCOPY (EGD) WITH PROPOFOL ;  Surgeon: Shaaron Lamar HERO, MD;  [facility];  Service: Endoscopy;  Laterality: N/A;  2:15 pm, asa 3/4   FOOT SURGERY     HIP ARTHROPLASTY Left 08/27/2020   Procedure: ARTHROPLASTY BIPOLAR HIP (HEMIARTHROPLASTY);  Surgeon: Onesimo Oneil LABOR, MD;  Location: [facility];  Service: Orthopedics;  Laterality: Left;   HOLMIUM LASER APPLICATION Right 03/01/2018   Procedure: HOLMIUM LASER APPLICATION;  Surgeon: Cam Morene ORN, MD;  [facility];  Service: Urology;  Laterality: Right;   JOINT REPLACEMENT     BTKR, RTHR   MALONEY DILATION N/A 02/15/2023   Procedure: MALONEY DILATION;  Surgeon: Shaaron Lamar HERO, MD;  [facility];  Service: Endoscopy;  Laterality: N/A;   TOTAL KNEE ARTHROPLASTY  08/21/2007   Right total knee replacement   Family History  Problem Relation Age of Onset   Stroke Mother    Stroke Father    Hypertension Sister    Diabetes Brother    Coronary artery disease Son        CABG at age 40   Heart disease Son    Heart attack Son    Hypertension Maternal Aunt    Cancer Daughter    Colon cancer Neg Hx    Social History   Occupational History   Occupation: Retired  Tobacco Use   Smoking status: Never   Smokeless tobacco: Never  Vaping Use   Vaping status: Never Used  Substance and Sexual Activity   Alcohol use: No   Drug use: No   Sexual activity: Not Currently   Tobacco Counseling Counseling given: Not Answered  SDOH Screenings   Food Insecurity: No Food Insecurity (03/12/2024)  Housing: Low Risk (03/12/2024)   Transportation Needs: No Transportation Needs (03/12/2024)  Utilities: Not At Risk (08/24/2023)  Alcohol Screen: Low Risk (04/19/2022)  Depression (PHQ2-9): Low Risk (05/01/2024)  Financial Resource Strain: Low Risk (03/12/2024)  Physical Activity: Inactive (05/01/2024)  Social Connections: Socially Isolated (05/01/2024)  Stress: No Stress Concern Present (05/01/2024)  Tobacco Use: Low Risk (05/01/2024)  Health Literacy: Inadequate Health Literacy (04/27/2023)   See flowsheets for full screening details  Depression Screen Depression Screening Exception Documentation Depression Screening Exception:: Patient refusal  PHQ 2 & 9 Depression Scale- Over the past 2 weeks, how often have you been bothered by any of the following problems? Little interest or pleasure in doing things: 0 Feeling down, depressed, or hopeless (PHQ Adolescent also includes...irritable): 0 PHQ-2 Total Score: 0  Goals Addressed             This Visit's Progress    Prevent falls   On track            Objective:    Today's Vitals   05/01/24 1121  Weight: 165 lb (74.8 kg)  Height: 5' 4 (1.626 m)   Body mass index is 28.32 kg/m.  Hearing/Vision screen Hearing Screening - Comments:: Some hearing difficulty; followed by ENT Vision Screening - Comments:: Wears rx glasses - [facility]  Immunizations and Health Maintenance Health Maintenance  Topic Date Due   COVID-19 Vaccine (1) Never done   DTaP/Tdap/Td (1 - Tdap) Never done   Zoster Vaccines- Shingrix (1 of 2) 12/31/1952   Medicare Annual Wellness (AWV)  05/01/2025   Pneumococcal Vaccine: 50+ Years  Completed   Influenza Vaccine  Completed   Bone Density Scan  Completed   Meningococcal B Vaccine  Aged Out        Assessment/Plan:  This is a routine wellness examination for Staley.  Patient Care Team: Duanne Butler DASEN, MD as PCP - General (Family Medicine) Pietro, Redell RAMAN, MD as PCP - Cardiology  (Cardiology) Verta Royden DASEN, DPM as Consulting Physician (Podiatry) Hedges, Reyes RIGGERS (Physician Assistant) Lanis Fonda BRAVO, MD as Consulting Physician (Vascular Surgery) Elspeth Lauraine DEL, OD as Referring Physician  I have personally reviewed and noted the following in the patients chart:   Medical and social history Use of alcohol, tobacco or illicit drugs  Current medications and supplements including opioid prescriptions. Functional ability and status Nutritional status Physical activity Advanced directives List of other physicians Hospitalizations, surgeries, and ER visits in previous 12 months Vitals Screenings to include cognitive, depression, and falls Referrals and appointments  No orders of the defined types were placed in this encounter.  In addition, I have reviewed and discussed with patient certain preventive protocols, quality metrics, and best practice recommendations. A written personalized care plan for preventive services as well as general preventive health recommendations were provided to patient.   Lavelle Charmaine Browner, LPN   [id number]/[id number]   Return in 1 year (on 05/01/2025).  After Visit Summary: (MyChart) Due to this being a telephonic visit, the after visit summary with patients personalized plan was offered to patient via MyChart   Nurse Notes: Patient scheduled for acute visit on 05/03/24 for knee pain.  Son would like to have cognition accessed at next in office visit with PCP  "

## 2024-05-01 NOTE — Telephone Encounter (Signed)
 Spoke with pt son. Pt is not having any urinary sx. Pt is being seen on Friday by Dr Aletha.

## 2024-05-03 ENCOUNTER — Ambulatory Visit: Admitting: Family Medicine

## 2024-05-03 ENCOUNTER — Encounter: Payer: Self-pay | Admitting: Family Medicine

## 2024-05-03 VITALS — BP 126/84 | HR 92 | Ht 64.0 in | Wt 163.2 lb

## 2024-05-03 DIAGNOSIS — G8929 Other chronic pain: Secondary | ICD-10-CM | POA: Diagnosis not present

## 2024-05-03 DIAGNOSIS — M25562 Pain in left knee: Secondary | ICD-10-CM

## 2024-05-03 MED ORDER — DICLOFENAC SODIUM 1 % EX GEL: 2.0000 g | Freq: Four times a day (QID) | CUTANEOUS | 1 refills | Status: AC

## 2024-05-03 NOTE — Progress Notes (Signed)
 "  Patient Office Visit  Assessment & Plan:  Chronic pain of left knee -     DG Knee Complete 4 Views Left; Future -     Diclofenac  Sodium; Apply 2 g topically 4 (four) times daily.  Dispense: 150 g; Refill: 1   Assessment and Plan    Osteoarthritis and chronic pain of left knee Chronic left knee pain, exacerbated by walking, with concerns about long-term meloxicam  use. Previous orthopedic evaluation unsatisfactory. - Ordered x-ray of left knee at [facility]. - Prescribed Voltaren  gel for topical pain relief. - Continue meloxicam  7.5 mg daily with food. - Consider referral to a different orthopedic specialist if x-ray results indicate further intervention is needed.  General Health Maintenance Received flu shot in November. - Ensure flu vaccination is up to date.          Return if symptoms worsen or fail to improve.   Subjective:    Patient ID: [name], female    DOB: 1934/03/22  Age: 89 y.o. MRN: [id number]  Chief Complaint  Patient presents with   Knee Pain    L knee pain that has gotten worse in the last 3 weeks.     Knee Pain    Discussed the use of AI scribe software for clinical note transcription with the patient, who gave verbal consent to proceed.  History of Present Illness       History of Present Illness [name] is an 89 year old female with arthritis who presents with worsening left knee pain. She is accompanied by her son. Patient comes in a wheelchair.   She has been experiencing ongoing worsening left knee pain over the past three weeks. Did see Ortho few months ago but no x-ray done. The pain is rated at six out of ten and sometimes worse. It significantly affects her ability to walk, causing her to stop due to shaking and a fear of falling. She uses a walker at home and is often accompanied by someone when walking. Patient is sedentary at home. No previous history of DVT or PE.   Her history of osteoarthritis affects her hands and  feet as well. She has been taking meloxicam  7.5 mg daily for the past few weeks, which provides some relief. She also uses Tylenol  as needed. There have been no falls or injuries to the knee. The pain is exacerbated by weight-bearing activities and is sometimes present at night, affecting her sleep. She elevates her legs to help with the pain. She total knee replacement right side several years ago.   She was seen by a knee doctor a few months ago at [facility], who did not perform any imaging studies but recommended continuing meloxicam . She has not had any recent x-rays of the knee.  She lives with her two sons and has an aide who assists her five days a week for three and a half hours each day, helping with daily activities such as bathing and feeding.  Physical Exam MUSCULOSKELETAL: Left knee tender to palpation  Assessment and Plan Osteoarthritis and chronic pain of left knee Chronic left knee pain, exacerbated by walking, with concerns about long-term meloxicam  use. Previous orthopedic evaluation unsatisfactory. - Ordered x-ray of left knee at [facility]. - Prescribed Voltaren  gel for topical pain relief. - Continue meloxicam  7.5 mg daily with food. - Consider referral to a different orthopedic specialist if x-ray results indicate further intervention is needed.  General Health Maintenance Received flu shot  in November. - Ensure flu vaccination is up to date.    The ASCVD Risk score (Arnett DK, et al., 2019) failed to calculate for the following reasons:   The 2019 ASCVD risk score is only valid for ages 22 to 50   Risk score cannot be calculated because patient has a medical history suggesting prior/existing ASCVD   * - Cholesterol units were assumed  Past Medical History:  Diagnosis Date   [name] (abdominal aortic aneurysm) 09/2013   3.6 cm   Acute and chronic respiratory failure with hypoxia (HCC)    Acute delirium    Acute encephalopathy    Acute pyelonephritis     Atrial fibrillation (HCC)    Chronic pain syndrome    Constipation    Dementia (HCC)    Dependent edema    bilateral legs    Depression    Diabetes mellitus    pre-diabetes   Diverticulosis    Diverticulosis    Dysrhythmia    Dysuria    E coli bacteremia    Fall    Gross hematuria    Hydronephrosis with renal and ureteral calculous obstruction    Hyperlipidemia    Hypertension    Hypoxemia    Idiopathic peripheral autonomic neuropathy    Infestation by bed bug    Nephrolithiasis    OSA on CPAP    Osteoarthritis    Osteopenia    T=  -2.1 in hip   Persistent mood (affective) disorder, unspecified    Prediabetes    Rheumatoid arthritis (HCC)    Rheumatoid arthritis(714.0)    S/P carpal tunnel release    Sepsis secondary to UTI [facility])    Spinal stenosis    Unspecified fracture of fifth lumbar vertebra, initial encounter for closed fracture (HCC)    Unspecified fracture of t11-T12 vertebra, initial encounter for closed fracture (HCC)    Urinary incontinence    Urinary tract infection    hx of    Urolithiasis    Past Surgical History:  Procedure Laterality Date   APPENDECTOMY     BIOPSY  02/15/2023   Procedure: BIOPSY;  Surgeon: Shaaron Lamar HERO, MD;  [facility];  Service: Endoscopy;;   CARPAL TUNNEL RELEASE  12/27/2006   Right subcutaneous ulnar nerve transfer -- Right open carpal tunnel release   CHOLECYSTECTOMY     CYSTOSCOPY W/ URETERAL STENT PLACEMENT Right 01/30/2018   Procedure: CYSTOSCOPY WITH STENT REPLACEMENT retrograde pylegram;  Surgeon: Cam Morene ORN, MD;  [facility];  Service: Urology;  Laterality: Right;   CYSTOSCOPY WITH URETEROSCOPY AND STENT PLACEMENT Right 03/01/2018   Procedure: CYSTOSCPY/RIGHT URETEROSCOPY HOLMIUM LASER LITHOTRIPSY AND STENT EXCHANGE;  Surgeon: Cam Morene ORN, MD;  [facility];  Service: Urology;  Laterality: Right;   ESOPHAGOGASTRODUODENOSCOPY (EGD) WITH PROPOFOL  N/A 02/15/2023   Procedure:  ESOPHAGOGASTRODUODENOSCOPY (EGD) WITH PROPOFOL ;  Surgeon: Shaaron Lamar HERO, MD;  [facility];  Service: Endoscopy;  Laterality: N/A;  2:15 pm, asa 3/4   FOOT SURGERY     HIP ARTHROPLASTY Left 08/27/2020   Procedure: ARTHROPLASTY BIPOLAR HIP (HEMIARTHROPLASTY);  Surgeon: Onesimo Oneil LABOR, MD;  [facility];  Service: Orthopedics;  Laterality: Left;   HOLMIUM LASER APPLICATION Right 03/01/2018   Procedure: HOLMIUM LASER APPLICATION;  Surgeon: Cam Morene ORN, MD;  [facility];  Service: Urology;  Laterality: Right;   JOINT REPLACEMENT     BTKR, RTHR   MALONEY DILATION N/A 02/15/2023   Procedure: AGAPITO DILATION;  Surgeon: Shaaron Lamar  M, MD;  [facility];  Service: Endoscopy;  Laterality: N/A;   TOTAL KNEE ARTHROPLASTY  08/21/2007   Right total knee replacement   Social History[1] Family History  Problem Relation Age of Onset   Stroke Mother    Stroke Father    Hypertension Sister    Diabetes Brother    Coronary artery disease Son        CABG at age 35   Heart disease Son    Heart attack Son    Hypertension Maternal Aunt    Cancer Daughter    Colon cancer Neg Hx    Allergies[2]  ROS    Objective:    BP 126/84   Pulse 92   Ht 5' 4 (1.626 m)   Wt 163 lb 4 oz (74 kg)   SpO2 96%   BMI 28.02 kg/m  BP Readings from Last 3 Encounters:  05/03/24 126/84  04/03/24 99/65  03/27/24 106/66   Wt Readings from Last 3 Encounters:  05/03/24 163 lb 4 oz (74 kg)  05/01/24 165 lb (74.8 kg)  04/03/24 165 lb (74.8 kg)    Physical Exam Constitutional:      General: She is not in acute distress.    Comments: In wheelchair.   Cardiovascular:     Rate and Rhythm: Normal rate.  Pulmonary:     Effort: Pulmonary effort is normal. No respiratory distress.     Breath sounds: No stridor.  Musculoskeletal:     Right knee: Tenderness present.     Left knee: Bony tenderness present. Decreased range of motion. Tenderness present over the medial joint  line.     Left ankle: Tenderness present.     Comments: No calf tenderness.       No results found for any visits on 05/03/24.          [1]  Social History Tobacco Use   Smoking status: Never   Smokeless tobacco: Never  Vaping Use   Vaping status: Never Used  Substance Use Topics   Alcohol use: No   Drug use: No  [2]  Allergies Allergen Reactions   Sulfonamide Derivatives Hives and Rash   "

## 2024-05-07 ENCOUNTER — Ambulatory Visit
Admission: RE | Admit: 2024-05-07 | Discharge: 2024-05-07 | Disposition: A | Discharge status: Home / Self Care | Source: Ambulatory Visit | Attending: Family Medicine | Admitting: Family Medicine

## 2024-05-07 DIAGNOSIS — G8929 Other chronic pain: Secondary | ICD-10-CM

## 2024-05-08 ENCOUNTER — Ambulatory Visit: Payer: Self-pay | Admitting: Family Medicine

## 2024-05-09 ENCOUNTER — Telehealth: Payer: Self-pay

## 2024-05-09 NOTE — Telephone Encounter (Signed)
 Copied from CRM #[id number]. Topic: Clinical - Medication Question >> May 09, 2024 12:36 PM Emylou G wrote: Reason for CRM: Son called.. interested in getting a seat for her toilet.  Can you review?  The toilet sits too low

## 2024-05-14 ENCOUNTER — Other Ambulatory Visit: Payer: Self-pay

## 2024-05-14 DIAGNOSIS — Z8673 Personal history of transient ischemic attack (TIA), and cerebral infarction without residual deficits: Secondary | ICD-10-CM

## 2024-05-14 DIAGNOSIS — I5032 Chronic diastolic (congestive) heart failure: Secondary | ICD-10-CM

## 2024-05-14 DIAGNOSIS — I482 Chronic atrial fibrillation, unspecified: Secondary | ICD-10-CM

## 2024-05-14 DIAGNOSIS — G8929 Other chronic pain: Secondary | ICD-10-CM

## 2024-05-14 DIAGNOSIS — M069 Rheumatoid arthritis, unspecified: Secondary | ICD-10-CM

## 2024-05-14 DIAGNOSIS — S22080S Wedge compression fracture of T11-T12 vertebra, sequela: Secondary | ICD-10-CM

## 2024-05-16 ENCOUNTER — Other Ambulatory Visit: Payer: Self-pay | Admitting: Family Medicine

## 2024-05-16 ENCOUNTER — Other Ambulatory Visit: Payer: Self-pay

## 2024-05-16 DIAGNOSIS — G8929 Other chronic pain: Secondary | ICD-10-CM

## 2024-05-16 DIAGNOSIS — L304 Erythema intertrigo: Secondary | ICD-10-CM

## 2024-05-16 NOTE — Telephone Encounter (Signed)
 Requested medications are due for refill today.  yes  Requested medications are on the active medications list.  yes  Last refill. 01/18/2024 30 g 1 rf  Future visit scheduled.   yes  Notes to clinic.  Medication not assigned to a protocol. Please review for refill.     Requested Prescriptions  Pending Prescriptions Disp Refills   clotrimazole -betamethasone  (LOTRISONE ) cream [Pharmacy Med Name: CLOTRIMAZOLE -BETAMETHASONE  CRM] 30 g 1    Sig: APPLY TO AFFECTED AREA TOPICALLY EVERY DAY     Off-Protocol Failed - 05/16/2024  4:49 PM      Failed - Medication not assigned to a protocol, review manually.      Passed - Valid encounter within last 12 months    Recent Outpatient Visits           1 week ago Chronic pain of left knee   [place] [facility] Aletha Bene, MD   2 months ago Need for vaccination   [place] [facility] [facility] Duanne Butler DASEN, MD   3 months ago Acute non intractable tension-type headache   [place] [facility] Duanne Butler DASEN, MD   8 months ago Frequent UTI   [place] [facility] Duanne Butler DASEN, MD   10 months ago Influenza   [place] [facility] Pickard, Butler DASEN, MD

## 2024-05-23 LAB — OPHTHALMOLOGY REPORT-SCANNED

## 2024-05-25 ENCOUNTER — Other Ambulatory Visit: Payer: Self-pay | Admitting: Family Medicine

## 2024-05-27 NOTE — Telephone Encounter (Signed)
 Requested Prescriptions  Pending Prescriptions Disp Refills   topiramate  (TOPAMAX ) 25 MG tablet [Pharmacy Med Name: TOPIRAMATE  25MG  TABS 25 Tablet] 120 tablet 3    Sig: TAKE 2 TABLETS BY MOUTH 2 TIMES DAILY     Neurology: Anticonvulsants - topiramate  & zonisamide Passed - 05/27/2024  2:06 PM      Passed - Cr in normal range and within 360 days    Creat  Date Value Ref Range Status  09/12/2023 0.83 0.60 - 0.95 mg/dL Final   Creatinine, Ser  Date Value Ref Range Status  03/27/2024 0.98 0.44 - 1.00 mg/dL Final   Creatinine, Urine  Date Value Ref Range Status  03/08/2007 135.3 mg/dL Final         Passed - CO2 in normal range and within 360 days    CO2  Date Value Ref Range Status  03/27/2024 29 22 - 32 mmol/L Final   Bicarbonate  Date Value Ref Range Status  07/08/2023 27.9 20.0 - 28.0 mmol/L Final         Passed - ALT in normal range and within 360 days    ALT  Date Value Ref Range Status  08/24/2023 15 0 - 44 U/L Final         Passed - AST in normal range and within 360 days    AST  Date Value Ref Range Status  08/24/2023 28 15 - 41 U/L Final         Passed - Completed PHQ-2 or PHQ-9 in the last 360 days      Passed - Valid encounter within last 12 months    Recent Outpatient Visits           3 weeks ago Chronic pain of left knee   [place] [facility] Aletha Bene, MD   2 months ago Need for vaccination   [place] [facility] Duanne Butler DASEN, MD   4 months ago Acute non intractable tension-type headache   [place] [facility] Duanne Butler DASEN, MD   8 months ago Frequent UTI   [place] [facility] Duanne Butler DASEN, MD   10 months ago Influenza   [place] [facility] Pickard, Butler DASEN, MD

## 2024-06-07 ENCOUNTER — Encounter: Admitting: Orthopedic Surgery

## 2024-07-02 ENCOUNTER — Ambulatory Visit: Admitting: Podiatry
